# Patient Record
Sex: Female | Born: 1945
Health system: Southern US, Community
[De-identification: ages and names within clinical notes are randomized; demographics above are authoritative.]

## PROBLEM LIST (undated history)

## (undated) DIAGNOSIS — Z800 Family history of malignant neoplasm of digestive organs: Secondary | ICD-10-CM

## (undated) DIAGNOSIS — Z933 Colostomy status: Secondary | ICD-10-CM

## (undated) DIAGNOSIS — I480 Paroxysmal atrial fibrillation: Secondary | ICD-10-CM

## (undated) DIAGNOSIS — Z860100 Personal history of colon polyps, unspecified: Secondary | ICD-10-CM

## (undated) DIAGNOSIS — M810 Age-related osteoporosis without current pathological fracture: Secondary | ICD-10-CM

## (undated) DIAGNOSIS — M8580 Other specified disorders of bone density and structure, unspecified site: Secondary | ICD-10-CM

## (undated) DIAGNOSIS — K5780 Diverticulitis of intestine, part unspecified, with perforation and abscess without bleeding: Secondary | ICD-10-CM

## (undated) DIAGNOSIS — R569 Unspecified convulsions: Secondary | ICD-10-CM

## (undated) DIAGNOSIS — Z8719 Personal history of other diseases of the digestive system: Secondary | ICD-10-CM

## (undated) DIAGNOSIS — I219 Acute myocardial infarction, unspecified: Secondary | ICD-10-CM

## (undated) DIAGNOSIS — G43909 Migraine, unspecified, not intractable, without status migrainosus: Secondary | ICD-10-CM

## (undated) DIAGNOSIS — J449 Chronic obstructive pulmonary disease, unspecified: Secondary | ICD-10-CM

## (undated) DIAGNOSIS — I272 Pulmonary hypertension, unspecified: Secondary | ICD-10-CM

## (undated) DIAGNOSIS — K219 Gastro-esophageal reflux disease without esophagitis: Secondary | ICD-10-CM

## (undated) DIAGNOSIS — F419 Anxiety disorder, unspecified: Secondary | ICD-10-CM

## (undated) DIAGNOSIS — L309 Dermatitis, unspecified: Secondary | ICD-10-CM

## (undated) DIAGNOSIS — I10 Essential (primary) hypertension: Secondary | ICD-10-CM

## (undated) DIAGNOSIS — M5030 Other cervical disc degeneration, unspecified cervical region: Secondary | ICD-10-CM

## (undated) DIAGNOSIS — E7800 Pure hypercholesterolemia, unspecified: Secondary | ICD-10-CM

## (undated) DIAGNOSIS — M3500 Sicca syndrome, unspecified: Secondary | ICD-10-CM

## (undated) DIAGNOSIS — S129XXA Fracture of neck, unspecified, initial encounter: Secondary | ICD-10-CM

## (undated) DIAGNOSIS — I2510 Atherosclerotic heart disease of native coronary artery without angina pectoris: Secondary | ICD-10-CM

## (undated) DIAGNOSIS — C44519 Basal cell carcinoma of skin of other part of trunk: Secondary | ICD-10-CM

## (undated) DIAGNOSIS — I7300 Raynaud's syndrome without gangrene: Secondary | ICD-10-CM

## (undated) DIAGNOSIS — C3490 Malignant neoplasm of unspecified part of unspecified bronchus or lung: Secondary | ICD-10-CM

## (undated) DIAGNOSIS — I739 Peripheral vascular disease, unspecified: Secondary | ICD-10-CM

## (undated) DIAGNOSIS — I4892 Unspecified atrial flutter: Secondary | ICD-10-CM

## (undated) HISTORY — PX: SKIN CANCER EXCISION: SHX779

---
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
## 1973-11-13 HISTORY — PX: NASAL SEPTUM SURGERY: SHX37

## 1980-11-13 DIAGNOSIS — H01003 Unspecified blepharitis right eye, unspecified eyelid: Secondary | ICD-10-CM | POA: Insufficient documentation

## 1981-11-13 HISTORY — PX: CERVICAL CONIZATION W/BX: SHX1330

## 1983-11-14 DIAGNOSIS — I7300 Raynaud's syndrome without gangrene: Secondary | ICD-10-CM | POA: Insufficient documentation

## 2007-09-14 DIAGNOSIS — I219 Acute myocardial infarction, unspecified: Secondary | ICD-10-CM

## 2007-09-14 HISTORY — DX: Acute myocardial infarction, unspecified: I21.9

## 2007-09-14 HISTORY — PX: CARDIAC CATHETERIZATION: SHX172

## 2007-09-14 HISTORY — PX: CORONARY ARTERY BYPASS GRAFT: SHX141

## 2007-09-15 ENCOUNTER — Inpatient Hospital Stay: Payer: Self-pay | Admitting: Internal Medicine

## 2007-09-16 ENCOUNTER — Inpatient Hospital Stay (HOSPITAL_COMMUNITY): Admission: EM | Admit: 2007-09-16 | Discharge: 2007-09-23 | Payer: Self-pay | Admitting: Cardiovascular Disease

## 2007-09-17 ENCOUNTER — Ambulatory Visit: Payer: Self-pay | Admitting: Surgery

## 2007-09-26 ENCOUNTER — Encounter: Admission: RE | Admit: 2007-09-26 | Discharge: 2007-09-26 | Payer: Self-pay | Admitting: Cardiothoracic Surgery

## 2007-09-26 ENCOUNTER — Ambulatory Visit: Payer: Self-pay | Admitting: Cardiothoracic Surgery

## 2007-10-06 ENCOUNTER — Emergency Department (HOSPITAL_COMMUNITY): Admission: EM | Admit: 2007-10-06 | Discharge: 2007-10-06 | Payer: Self-pay | Admitting: Emergency Medicine

## 2007-10-08 ENCOUNTER — Encounter: Admission: RE | Admit: 2007-10-08 | Discharge: 2007-10-08 | Payer: Self-pay | Admitting: Surgery

## 2007-10-08 ENCOUNTER — Ambulatory Visit: Payer: Self-pay | Admitting: Surgery

## 2007-10-18 ENCOUNTER — Encounter: Admission: RE | Admit: 2007-10-18 | Discharge: 2007-10-18 | Payer: Self-pay | Admitting: Cardiothoracic Surgery

## 2007-10-18 ENCOUNTER — Ambulatory Visit: Payer: Self-pay | Admitting: Cardiothoracic Surgery

## 2007-11-19 ENCOUNTER — Ambulatory Visit: Payer: Self-pay | Admitting: Surgery

## 2007-12-19 ENCOUNTER — Encounter (HOSPITAL_COMMUNITY): Admission: RE | Admit: 2007-12-19 | Discharge: 2008-03-18 | Payer: Self-pay | Admitting: Cardiovascular Disease

## 2009-06-19 ENCOUNTER — Encounter: Admission: RE | Admit: 2009-06-19 | Discharge: 2009-06-19 | Payer: Self-pay | Admitting: Specialist

## 2009-07-13 ENCOUNTER — Ambulatory Visit (HOSPITAL_COMMUNITY): Admission: RE | Admit: 2009-07-13 | Discharge: 2009-07-13 | Payer: Self-pay | Admitting: Cardiovascular Disease

## 2009-11-13 DIAGNOSIS — S129XXA Fracture of neck, unspecified, initial encounter: Secondary | ICD-10-CM

## 2009-11-13 DIAGNOSIS — R569 Unspecified convulsions: Secondary | ICD-10-CM

## 2009-11-13 HISTORY — DX: Fracture of neck, unspecified, initial encounter: S12.9XXA

## 2009-11-13 HISTORY — DX: Unspecified convulsions: R56.9

## 2010-02-12 DIAGNOSIS — Z8781 Personal history of (healed) traumatic fracture: Secondary | ICD-10-CM | POA: Insufficient documentation

## 2010-02-21 ENCOUNTER — Emergency Department (HOSPITAL_COMMUNITY)
Admission: EM | Admit: 2010-02-21 | Discharge: 2010-02-21 | Payer: Self-pay | Source: Home / Self Care | Admitting: Emergency Medicine

## 2010-12-05 ENCOUNTER — Encounter: Payer: Self-pay | Admitting: Cardiovascular Disease

## 2011-03-28 NOTE — Discharge Summary (Signed)
NAMEMIRABELLA, [name] NO.:  [id number]   MEDICAL RECORD NO.:  [id number]          PATIENT TYPE:  INP   LOCATION:  2019                         FACILITY:  [facility]   PHYSICIAN:  Evelene Croon, M.D.     DATE OF BIRTH:  1946/08/19   DATE OF ADMISSION:  09/16/2007  DATE OF DISCHARGE:                               DISCHARGE SUMMARY   PRIMARY ADMITTING DIAGNOSIS:  Acute coronary syndrome.   ADDITIONAL/DISCHARGE DIAGNOSES:  1. Acute coronary syndrome.  2. Severe two-vessel coronary artery disease.  3. Hypertension.  4. Long history of tobacco abuse.  5. Chronic obstructive pulmonary disease/emphysema.  6. Acute blood loss anemia.  7. Hyperlipidemia.   PROCEDURE PERFORMED:  1. Emergent cardiac catheterization with temporary pacemaker      insertion.  2. Emergency coronary artery bypass grafting x3 (left internal mammary      artery to the LAD, saphenous vein graft to the second diagonal,      saphenous vein graft to the posterior descending).  3. Endoscopic vein harvest right leg.   HISTORY:  The patient is a 65 year old, white female who presented on  September 15, 2007 to [facility] with complaints of  chest pain. She was found to have anterolateral EKG changes and was  admitted with a diagnosis of acute coronary syndrome.  Throughout the  night, she continued to have intermittent episodes of chest pain as well  as episodes of hypotension and severe bradycardia despite heparin,  nitroglycerin and Integrilin. She was subsequently transferred to [facility] on September 16, 2007 for further cardiac workup.   HOSPITAL COURSE:  Ms. Norgard was transferred to [facility] on September 16, 2007  and was seen by [facility] and vascular.  Because of her  continued episodes of chest pain, she was taken to the cardiac cath lab  for emergency cardiac catheterization.  This was performed by Dr. Tresa Endo  and she was found to have severe two-vessel coronary  artery disease  including a 99% ostial right coronary stenosis, a 95% proximal stenosis  of the LAD with a complex 90% stenosis at the bifurcation of the second  diagonal branch. Left ventricular ejection fraction was preserved at  about 50%.  The patient had no further episodes of chest pain in the  cath lab, but she did have some bradycardia and a temporary pacemaker  was placed. Because of her severe disease, she was not felt to be a good  candidate for percutaneous intervention and therefore a cardiothoracic  surgery consultation was obtained.  Dr. Laneta Simmers saw the patient in the  cath lab and reviewed her films and felt that her best option would be  to proceed with emergency CABG at this time in order to prevent further  ischemia infarction and death.  He explained the risks, benefits and  alternatives of the procedure with the patient and her family and she  agreed to proceed.  She was taken immediately to the operating room and  underwent coronary artery bypass grafting x3 performed by Dr. Laneta Simmers.  She tolerated the procedure well and was transferred  to the SICU in  stable condition.  She was able to be extubated shortly after surgery.  She was hemodynamically stable although she did require Neo-Synephrine  and dopamine drip initially postoperatively.  She remained in the unit  for further observation.  By postop day #2 she was off all drips.  Her  chest tubes and hemodynamic monitoring devices were removed and she was  slowly allowed to ambulate with cardiac rehab's assistance.  Late in the  day on postop day #2, she was able to be transferred to the floor.  Overall she has done well postoperatively. Her main postoperative  difficulty has been from volume overload.  She has been approximately 15-  20 kg above her preoperative weight and has been aggressively diuresed  with both IV and p.o. Lasix.  She has responded well and is diuresing  but continues to have lower extremity edema  and some small bilateral  pleural effusions on physical exam.  On postop day #4, she is 71 kg with  a preop of 59.  She has been started on beta blocker as well as an ACE  inhibitor and a statin.  Her vital signs have been stable and she has  remained afebrile.  She is currently being weaned from supplemental  oxygen and is down to 2 liters and maintaining O2 sats of greater than  90%.  Her incisions are all healing well.  Her labs on postop day #4  show hemoglobin of 10.7, hematocrit 31.2, platelets 196, white count  10.5, sodium 137, potassium 3.7, BUN 8, creatinine 0.59.  She has been  ambulating without problem.  She is tolerating a regular diet and is  having normal bowel and bladder function.  She will continue to be  observed for at least the next 24-48 hours.  If she remains stable, is  able to wean from supplemental oxygen and is otherwise doing well, she  will hopefully be ready for discharge home on or about September 22, 2007.   DISCHARGE MEDICATIONS:  1. Enteric-coated aspirin 325 mg daily.  2. Lopressor 25 mg b.i.d.  3. Lisinopril 2.5 mg daily.  4. Lipitor 40 mg daily.  5. Tylox one to two q.4 h p.r.n. for pain.  6. She will need continued diuresis at home with Lasix and potassium      but a final dose and duration for this will be determined at the      time of discharge based on her volume status at the time.   DISCHARGE INSTRUCTIONS:  She is asked to refrain from driving, heavy  lifting or strenuous activity.  She may continue ambulating daily and  using her incentive spirometer.  She may shower daily and clean her  incisions with soap and water.  She will continue a low-fat, low-sodium  diet.   DISCHARGE FOLLOWUP:  She will see Dr. Donata Clay, Dr. Sharee Pimple partner,  back in the office in 3 weeks with a chest x-ray from [facility].  She will need to make an appointment see Dr. Tresa Endo in 2 weeks  for cardiology reevaluation.  In the interim if she experiences  problems  or has questions she is asked to contact our office.      Coral Ceo, P.A.      Evelene Croon, M.D.  Electronically Signed    GC/MEDQ  D:  09/20/2007  T:  09/21/2007  Job:  [id number]   cc:   Nicki Guadalajara, M.D.

## 2011-03-28 NOTE — Assessment & Plan Note (Signed)
OFFICE VISIT   [name], [name]  DOB:  1946-08-31                                        September 26, 2007  CHART #:  [id number]   The patient returns to the office after having emergency coronary artery  bypass grafting x 3 by Dr. Laneta Simmers on 09/16/07 and she was discharged  home on 09/23/07.  She comes in today after discussing last night with  Dr. Laneta Simmers some choking sensation she was having also constipation,  painful urination and wanted Protonix.  The patient was discharged home  on home oxygen.  Currently she is on 2 liters.  She notes that last  night she had some difficulty swallowing her pills with painful  swallowing.  This has improved today.  She has also had some slight  burning on urination and has not had a bowel movement since last week.  A chest x-ray was done today.   On exam today her blood pressure is 101/57, pulse is 57, respiratory  rate is 18, O2 sat is 98% on 2 liters nasal cannula.  She is afebrile.  Overall she looks very well.  She is in no distress and is having no  respiratory difficulty.  On exam she has no wheezing.  Sternum is stable  and well healed.  She has no strider.  Abdomen is not tender to  palpation.   Chest x-ray was obtained and showed clearing of small bilateral  effusions that were present in the postoperative.  Overall she appears  to be stable and she has complained of reflux symptoms in the past.  Was  not on Protonix preoperatively but note that it did help while she was  in the hospital and gave her prescription for Protonix for 2 weeks 40 mg  once a day.  She will use milk of magnesia and a fleets enema to help  with her bowel situation.  She was discharged home on Cipro which should  treat urinary tract infection caused by a Foley catheter.  However, in  spite of being on antibiotics I have asked her to get a UA and a urine  culture through her medical doctor's  office.  She will return in approximately  7-10 days for a follow-up  visit with Dr. Laneta Simmers unless she has any further difficulty.   Sheliah Plane, MD  Electronically Signed   EG/MEDQ  D:  09/26/2007  T:  09/27/2007  Job:  [id number]   cc:   Evelene Croon, M.D.

## 2011-03-28 NOTE — Assessment & Plan Note (Signed)
OFFICE VISIT   [name], [name]  DOB:  [birth year]                                        October 18, 2007  CHART #:  [id number]   CURRENT PROBLEMS:  1. Status post emergency CABG x3, September 16, 2007, for unstable      angina.  2. Chronic obstructive pulmonary disease with history of smoking, now      reformed.  3. Postoperative postcardiotomy syndrome with symptoms improved      following a course of Indocin.   HISTORY OF PRESENT ILLNESS:  The patient is a 65 year old female with a  history of smoking and hypertension, who presented to [facility]  with unstable angina and EKG changes.  She was transferred to [facility], and a CAT showed a 99% ostial right coronary stenosis with a  proximal left coronary stenosis as well.  She underwent left IMA  grafting to the LAD and vein grafts to the diagonal branch to the LAD,  and a vein graft to the posterior descending.  The patient had a  somewhat prolonged postoperative course and required discharge on  oxygen.  She remained in a sinus rhythm and was discharged home on  Lopressor 25 daily, Protonix 40 mg daily, lisinopril 5 mg daily,  aspirin, and Lipitor 40 mg daily.   She now returns for her 2nd posthospital discharge office visit.  She  states she feels much better after taking 3 days of Indocin for  postpericardiotomy pericarditis.  She is breathing much better and not  using oxygen consistently at home.  She denies any angina but has  generalized soreness, and her overall weakness has improved as has her  exercise tolerance.  The surgical incisions are healing very nicely, and  her appetite is good.   PHYSICAL EXAM:  Blood pressure 127/80, pulse 60, respirations 18,  saturation 97%.  She is alert and pleasant.  Breath sounds are clear.  The sternum is stable and well-healed.  Cardiac rhythm is regular  without S3 or S4 rub.  There is no peripheral edema, and the leg  incisions are healing  well.   A PA and lateral chest x-ray shows resolution of the prior small pleural  effusions with clear lung fields now and a stable cardiac shadow.   IMPRESSION AND PLAN:  The patient seems to be oxygenating well now, as  her saturation is 97% off oxygen for several hours.  Her chest x-ray is  also much clearer.  I told the patient and her daughter that she could  use the oxygen only as needed, but she does not need to sleep with  oxygen on or use oxygen other than for periods of postexercise dyspnea.  The patient is still taking Lasix, and I feel that she probably just  needs another week of Lasix, and then that could be stopped.  Otherwise,  she will continue her current medications.  She knows she can resume  driving and that the outpatient cardiac rehab program at [facility] will  contact her.  We will arrange for her to return in 3 weeks for her final  visit and discuss stopping her home oxygen therapy.  No pain medications  were requested or prescribed at this visit.   Kerin Perna, M.D.  Electronically Signed   PV/MEDQ  D:  10/18/2007  T:  10/19/2007  Job:  [id number]   cc:   [facility] [place]  [place] Dr. Tresa Endo, [facility] and [place].  [place] Dr. Vanna Scotland, [facility], [place]

## 2011-03-28 NOTE — Op Note (Signed)
NAMEJILLIANNE, [name] NO.:  [id number]   MEDICAL RECORD NO.:  [id number]          PATIENT TYPE:  INP   LOCATION:  2307                         FACILITY:  [facility]   PHYSICIAN:  Evelene Croon, M.D.     DATE OF BIRTH:  12-Aug-1946   DATE OF PROCEDURE:  09/16/2007  DATE OF DISCHARGE:                               OPERATIVE REPORT   PREOPERATIVE DIAGNOSIS:  Severe 2-vessel coronary artery disease with  acute coronary syndrome.   POSTOPERATIVE DIAGNOSIS:  Severe 2-vessel coronary artery disease with  acute coronary syndrome.   OPERATIVE PROCEDURE:  Emergency median sternotomy, extracorporeal  circulation, coronary bypass graft surgery x3 using a left internal  mammary artery graft to the left anterior descending coronary artery,  with a saphenous vein graft to the second diagonal branch of the LAD,  and a saphenous vein graft to the posterior descending branch of the  right coronary artery.  Endoscopic vein harvesting from the right leg.   ATTENDING SURGEON:  Dr. Evelene Croon.   ASSISTANT:  Gershon Crane, P.A.-C   ANESTHESIA:  General endotracheal.   CLINICAL HISTORY:  This patient is a 65 year old woman with a strong  family history of heart disease and history of smoking and hypertension  who presented last p.m. to [facility] with an  acute coronary syndrome with chest pain and anterolateral EKG changes.  She had further episodes of chest pain throughout the night requiring  heparin, nitroglycerin, and Integrilin.  She had episodes of hypotension  and severe bradycardia and this morning was transferred to [facility]  for cardiac catheterization.  This showed a 99.9% ostial right coronary  artery stenosis with slow flow in the vessel.  The left coronary system  had no significant left main or left circumflex disease.  The LAD had a  95% proximal stenosis and then a complex 90% stenosis at the bifurcation  of the second diagonal branch.  Left  ventricular function was fairly  well preserved with an EF about 50%.  The patient remained stable in the  catheterization lab but did have some bradycardia and had a temporary  pacemaker placed by cardiology.  After review of the catheterization in  the cath lab, I felt the best option would be to proceed with emergency  coronary bypass graft surgery to prevent further ischemia, infarction,  and death.  I discussed the operative procedure with the patient and her  daughter.  We discussed alternatives, benefits, and risks including but  not limited to bleeding, blood transfusion, infection, stroke,  myocardial infarction, graft failure, heart block requiring permanent  pacemaker, and death.   OPERATIVE PROCEDURE:  The patient was taken to the operating room and  placed on the table in the supine position.  After induction of general  endotracheal anesthesia, a Foley catheter was placed in the bladder  using sterile technique.  Preoperative intravenous antibiotics were  given.  Then lines were placed by anesthesiology.  The chest, abdomen  and both lower extremities were prepped and draped in the usual sterile  manner.  The chest was entered through a median  sternotomy incision and  the pericardium opened in the midline.  Examination of the heart showed  hypokinesis of the right ventricle.  The patient had an elevated CVP of  about 25, consistent with volume overload and right heart dysfunction,  probably related to right coronary ischemia.  She remained fairly  stable.  Then the left internal mammary artery was harvested from the  chest wall as a pedicle graft.  This was a small to medium caliber  vessel with good blood flow through it.  At the same time, a segment of  greater saphenous vein was harvested from the right leg using endoscopic  vein harvest technique.  This vein was of medium size and good quality.   Then the patient was heparinized and when an adequate activated clotting   time was achieved, the distal ascending aorta was cannulated using a 20-  [place] aortic cannula for arterial inflow.  Venous outflow was achieved  using a 2-stage venous cannula through the right atrial appendage.  Antegrade cardioplegia and vent cannula was inserted in the aortic root.   The patient was placed on cardiopulmonary bypass and the distal  coronaries identified.  The LAD was heavily diseased in its proximal  portion.  The mid and distal portions were suitable for grafting.  This  vessel got fairly small distally.  The second diagonal branch was a  medium size graftable vessel in its midportion that was suitable for  grafting.  It was heavily diseased proximally.  The right coronary  artery was diffusely diseased with some plaque.  There was a single  posterior descending branch which was a medium size graftable vessel  with no significant disease in it.   Then the patient was cooled to 28 degrees centigrade.  The aorta was  crossclamped and 600 mL of cold blood antegrade cardioplegia was  administered in the aortic root with quick arrest of the heart.  Topical  hypothermia with iced saline was used.  The temperature probe was placed  in the septum and insulating pad in the pericardium.   The first distal anastomosis was performed to the posterior descending  coronary.  The internal diameter was about 1.6 mm.  The conduit used was  a segment of greater saphenous vein and the anastomosis performed in an  end-to-side manner using continuous 7-0 Prolene suture.  Flow was noted  through the graft and was excellent.  Then a dose of cardioplegia was  given down this vein graft.   The second distal anastomosis was performed to the diagonal branch.  The  second diagonal had an internal diameter of about 1.6 mm.  The conduit  used was a second segment of greater saphenous vein and the anastomosis  formed in an end-to-side manner using continuous 7-0 Prolene suture.  Flow was noted  through the graft and was excellent.  Then another dose  of cardioplegia was given down both vein grafts and the aortic root.   The third distal anastomosis was performed to the midportion of the left  anterior descending coronary artery.  The internal diameter was also  about 1.6 mm.  The conduit used was a left internal mammary graft and  this was brought through an opening in the left pericardium anterior to  the phrenic nerve.  It was anastomosed to the LAD in an end-to-side  manner using continuous 8-0 Prolene suture.  The pedicle was sutured to  the epicardium with 6-0 Prolene sutures.  The patient was rewarmed to 37  degrees centigrade.  Then the 2 proximal vein graft anastomoses were  performed to the aortic root in an end-to-side manner using continuous 6-  0 Prolene suture.  The clamp was then removed from the mammary pedicle.  There was rapid warming of the ventricular septum and return of  spontaneous ventricular fibrillation.  The crossclamp removed time of 66  minutes and the patient spontaneously returned to sinus rhythm.  The  proximal and distal anastomoses appeared hemostatic and lie of the graft  satisfactory.  Graft markers were placed around the proximal  anastomoses.  Two temporary right ventricular and right atrial pacing  wires were placed and brought out through the skin.   When the patient rewarmed to 37 degrees centigrade, she was weaned from  cardiopulmonary bypass on low-dose dopamine.  Total bypass time was 88  minutes.  Cardiac function appeared good with a cardiac output of 5  liters a minute.  Protamine was given and the venous and aortic cannulas  were removed without difficulty.  The patient was given 10 units of  platelets since she had been on Integrilin preoperatively and had some  obvious coagulopathy related to that.  She was given 2 units of packed  red blood cells on bypass for a hemoglobin of 7.  After obtaining  hemostasis 3 chest tubes were  placed, with a tube in the posterior  pericardium, one in the anterior mediastinum and one in the left pleural  space.  The sternum was then closed with #6 stainless steel wires.  The  fascia was closed with continuous #1 Vicryl suture.  Subcutaneous tissue  was closed with continuous 2-0 Vicryl and the skin with a 3-0 Vicryl  subcuticular closure.  Lower extremity vein harvest site was closed in  layers in a similar manner.  The  sponge, needle and instrument counts were correct according to scrub  nurse.  Dry sterile dressings were applied over the incisions and around  the chest tubes which were hooked to Pleur-evac suction.  The patient  remained hemodynamically stable and was transferred to the SICU in  guarded but stable condition.      Evelene Croon, M.D.  Electronically Signed     BB/MEDQ  D:  09/16/2007  T:  09/17/2007  Job:  [id number]   cc:   Nicki Guadalajara, M.D.

## 2011-03-28 NOTE — Assessment & Plan Note (Signed)
OFFICE VISIT   [name], Bridget  DOB:  1946/05/26                                        October 08, 2007  CHART #:  [id number]   The patient returned today for followup. She is status post emergency  coronary artery bypass graft surgery x3 on September 16, 2007. She had  been seen by Dr. Tyrone Sage on September 26, 2007 with some complaints of  dysphagia and reflux type symptoms. She was started on Protonix 40 mg  per day and noted a marked improvement in these symptoms. She has  remained on oxygen at home. She said that she had weaned off of the  oxygen briefly, but then over the last several days has noticed some  pleuritic right chest pain as well as increased shortness of breath and  went back on the oxygen. She had stopped taking her Lasix for several  days due to feeling that it was drying her out and has noticed some  increased swelling in her legs and feet. She had noticed no fever or  chills. She has had no sputum production or hemoptysis. She said the  pleuritic right chest pain has prevented her from taking deep breaths.   PHYSICAL EXAMINATION:  Today, her blood pressure is 98/63, pulse 58 and  regular. Respiratory rate is 18 and nonlabored. Her oxygen saturation is  96% on 2.5 liters per minute. She looks better than she did when she  went home. CARDIAC: Shows a regular rate and rhythm with normal heart  sounds. There is no murmur, rub or gallop. LUNGS:  Reveals decreased  breath sounds in the bases. There are a few crackles bilaterally. Chest  incision is healing well and the sternum is stable. There is mild  bilateral pedal edema. ABDOMEN: Examination is benign.   Followup chest x-ray today shows small bilateral pleural effusions,  blunted costophrenic angles. Her last chest x-ray was done a few days  ago in the emergency room and showed tiny bilateral pleural effusions.  The effusions today seemed slightly larger than a few days ago.   MEDICATIONS:  1. Lopressor ER 25 mg daily.  2. Protonix 40 mg daily.  3. Lisinopril 5 mg daily.  4. Aspirin 325 mg daily.  5. Lipitor 40 mg daily.  6. Lasix 40 mg daily.  7. Potassium chloride 20 mEq daily.  8. Ultram p.r.n. for pain. She said the Ultram has made her very      sleepy and she stopped taking it.   IMPRESSION:  Overall, I think the patient is making a slow, but  progressive recovery following surgery. She does have small bilateral  pleural effusions and some pleuritic right chest pain, probably related  to that and certainly could have a post pericardiotomy syndrome. She did  take a Lasix pill yesterday and said that it made her urinate a lot and  she noticed a decrease in her lower extremity swelling. I asked her to  return to taking her Lasix 40 mg daily and potassium chloride 20 mEq  daily. I also asked her to keep track of her blood pressure, pulse, and  weight. I started her on Indocin 25 mg t.i.d. x3 days to see if this  will help her pain if this is related to a post cardiotomy syndrome. I  also wrote her a prescription for oxycodone  IR 5 mg p.o. q4 hours p.r.n.  for pain #40 since she was taking this at the time of discharge and said  that it worked well without the sleepiness. She will continue to  followup with Dr. Tresa Endo and she has a followup appointment to see Dr.  Donata Clay in our office next Friday with a chest x-ray to assure that  her pleural effusions have resolved. She has continued to abstain from  smoking and I would expect that her oxygen saturations will continue to  improve with time and she will be weaned off of oxygen.   Evelene Croon, M.D.  Electronically Signed   BB/MEDQ  D:  10/08/2007  T:  10/08/2007  Job:  [id number]   cc:   Nicki Guadalajara, M.D.

## 2011-03-28 NOTE — Consult Note (Signed)
[name], [name] NO.:  [id number]   MEDICAL RECORD NO.:  [id number]          PATIENT TYPE:  INP   LOCATION:  2399                         FACILITY:  [facility]   PHYSICIAN:  Evelene Croon, M.D.     DATE OF BIRTH:  November 20, 1945   DATE OF CONSULTATION:  09/16/2007  DATE OF DISCHARGE:                                 CONSULTATION   REASON FOR CONSULTATION:  Severe two-vessel coronary disease with acute  coronary syndrome   CLINICAL HISTORY:  I was asked by Dr. Nicki Guadalajara to evaluate this 65-  year-old woman in the cardiac catheterization lab for consideration of  emergent coronary bypass graft surgery.  She has a history of  hypertension, smoking, and a family history of heart disease and  presented to [facility] last night after  developing acute onset of chest pain around 6 o'clock p.m.  Apparently  she had just eaten dinner and was playing with her grandchildren when  she felt substernal chest pain. She sat down on the couch and, according  to her daughter, had a syncopal episode.  She waxed and waned in our of  consciousness several times and complained of chest pain and vomited  once.  She was transported to [facility] by EMS  with institution of nitroglycerin and was feeling better.  She had  negative enzymes on presentation with a troponin I of less than 0.1 and  a total CPK of 70 with MB of 1.1.  Electrocardiogram showed  anterolateral ST and T-wave changes with sinus rhythm in the 70s.  Over  the course of last evening, she developed recurrent episodes of chest  pain treated with nitroglycerin, heparin, and Integrilin.  She had  episodes of hypotension and bradycardia overnight, making medical  therapy difficult.  According to her daughter, she had a very rough  night.  She continued to have chest pain this morning and was  transferred to [facility] for urgent catheterization.  Her second set of  enzymes had  increased with a troponin of 0.5 and a CPK-MB of 10.4 at  about 1:45 in the morning.   In the catheterization lab today, a temporary pacemaker was placed due  to bradycardia.  Cardiac catheterization showed a 99% ostial right  coronary stenosis with faint flow down the vessel.  There were left-to-  right collaterals filling the distal vessel.  The LAD had a 95% proximal  stenosis and a 90% complex bifurcation stenosis at the takeoff of the  large second diagonal branch.  The left circumflex had insignificant  disease.  The left main had no significant disease.  There was no  gradient across the aortic valve.  Left ventricular function was fairly  well preserved with inferior and posterior hypokinesis.  Ejection  fraction was estimated at 50%.  She remained stable in the cardiac  catheterization lab but did have bradycardia with intracoronary  injection.  She had no chest pain in the catheterization lab.   REVIEW OF SYSTEMS:  GENERAL:  She denies any fever or chills.  She has  had no  recent weight changes.  She has had 1-2 months of fatigue and  tiredness.  EYES:  She has a history of dry eye syndrome.  ENT:  Negative.  ENDOCRINE:  She denies diabetes and hypothyroidism.  CARDIOVASCULAR:  According to her daughter, she has had some chest pain  with radiation to her left arm and neck over the past couple months and  became pale and diaphoretic with these episodes.  She denies any PND or  orthopnea.  She has not had exertional dyspnea.  She denies palpitations  and peripheral edema.  RESPIRATORY:  She denies sputum production.  Her  daughter said that she has chronic cough, probably related to smoking.  GI:  She denies nausea, vomiting.  She has had no melena or bright red  blood per rectum.  GU:  She denies dysuria and hematuria.  MUSCULOSKELETAL:  She denies arthralgias and myalgias.  NEUROLOGICAL:  She denies any focal weakness or numbness.  She denies dizziness,  syncope.  He never had  TIA or stroke.  VASCULAR:  She denies  claudication or phlebitis.  PSYCHIATRIC:  Negative.  HEMATOLOGIC:  Negative.   ALLERGIES:  None.   PAST MEDICAL HISTORY:  Is significant for hypertension.  She has history  of dry eye syndrome.   SOCIAL HISTORY:  She is divorced and lives with her elderly mother whom  she cares for.  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  She has  smoke one pack per day x40 years.  She has rare alcohol use.   FAMILY HISTORY:  Strongly positive for cardiac disease.  Her father died  in his 65s of myocardial infarction and had his first MI at age 67.  Her  mother had coronary disease and had stents placed.  A brother had  coronary disease and had bypass surgery.   PHYSICAL EXAMINATION:  VITAL SIGNS:  Blood pressure is 100/60, pulse 80  and being paced.  Respiratory rate is 18 and unlabored.  GENERAL:  She is a well-developed white female in no distress.  HEENT:  Exam shows her to be normocephalic and atraumatic.  Pupils are  equal and reactive to light and accommodation.  Extraocular muscles are  intact.  Throat is clear.  NECK:  Exam shows normal carotid pulses bilaterally.  No bruits.  There  is no adenopathy or thyromegaly.  CARDIAC:  Exam shows regular rate and rhythm with normal S1-S2.  There  is no murmur, rub, or gallop.  ABDOMEN:  Exam shows active bowel sounds.  Abdomen is soft and  nontender.  No palpable masses or organomegaly.  EXTREMITIES:  Exam shows no peripheral edema.  Pedal pulses are palpable  bilaterally.  SKIN:  Warm and dry.  NEUROLOGIC:  Exam shows her to be alert and oriented x3.  Motor and  sensory exams grossly normal.   Electrolytes were normal with BUN of 13, creatinine of 0.8.  Hemoglobin  is 13.9, white blood cell count 11.8, platelet count 407,000.  She is on  Integrilin.[name]   IMPRESSION:  [name] has severe two-vessel coronary artery disease  with acute coronary syndrome and probable non-ST segment-elevation   myocardial infarction.  She has had recurrent episodes of chest pain,  hypotension, bradycardia overnight, and I agree with Dr. Tresa Endo that the  best treatment for this is coronary bypass graft surgery.  She is not a  candidate for stent placement.  I think the surgery should be performed  emergently given her anatomy and the above  symptoms.  I discussed the  operative procedure with her daughter.  We discussed alternatives,  benefits, and risks including but not limited to bleeding, blood  transfusion, infection, stroke, myocardial infarction, graft failure,  and death.  I also discussed the importance of maximum cardiac risk  factor reduction including complete smoking cessation.  She understands  and would like to proceed with surgery.      Evelene Croon, M.D.  Electronically Signed     BB/MEDQ  D:  09/16/2007  T:  09/16/2007  Job:  [id number]   cc:   Nicki Guadalajara, M.D.

## 2011-03-28 NOTE — Discharge Summary (Signed)
[name], FEEBACK NO.:  [id number]   MEDICAL RECORD NO.:  [id number]          PATIENT TYPE:  INP   LOCATION:  2019                         FACILITY:  [facility]   PHYSICIAN:  Evelene Croon, M.D.     DATE OF BIRTH:  Oct 24, 1946   DATE OF ADMISSION:  09/16/2007  DATE OF DISCHARGE:                               DISCHARGE SUMMARY   ADDENDUM  This is an addendum to the patient's discharge summary which was  dictated September 20, 2007.  The patient was tentatively ready for  discharge home September 22, 2007.  Unfortunately the patient's discharge  was held due to the patient complaining of positive productive green  sputum.  This was noted November 8.  Chest x-ray obtained noted to be  stable.  The patient was afebrile.  Felt the patient developed  bronchitis postoperatively and was started on ciprofloxacin 500 mg p.o.  The patient was also requiring 1-2 liters nasal cannula to keep O2 sats  greater than 90%. She was encouraged to use her incentive spirometer and  continued ambulating.  The patient's pulmonary status improved over the  next several days.  The patient was able to be weaned off oxygen sating  greater than 90% on room air.  During this time, the patient's vital  signs continued to be monitored and remained stable.  She remained in  normal sinus rhythm.  All incisions were clean, dry and intact and  healing well.  Lab work postop day #6 showed a white count of 10.7,  hemoglobin of 12.1, hematocrit 35.1, glucose of 299.  Sodium of 140,  potassium 4.5, chloride of 101, bicarb of 30, BUN of 8, creatinine 0.65,  glucose 101.   The patient is tentatively ready for discharge home in the a.m. September 23, 2007.  Please see dictated discharge summary for follow-up  appointments and discharge information.   DISCHARGE MEDICATIONS:  1. Aspirin 325 mg daily.  2. Lopressor 25 mg b.i.d.  3. Lisinopril 2.5 mg daily.  4. Lipitor 40 mg daily.  5. Lasix 40 mg daily.  6.  Potassium chloride 20 mEq daily.  7. Ciprofloxacin 500 mg b.i.d. x7 days.  8. Tylox 1-2 tablets q. 4-6 hours p.r.n. pain.      [name], [place]      Evelene Croon, M.D.  Electronically Signed   KMD/MEDQ  D:  09/22/2007  T:  09/23/2007  Job:  [id number]   cc:   Nicki Guadalajara, M.D.

## 2011-03-28 NOTE — Cardiovascular Report (Signed)
NAMECRISTINA, [name] NO.:  [id number]   MEDICAL RECORD NO.:  [id number]          PATIENT TYPE:  INP   LOCATION:  2908                         FACILITY:  [facility]   PHYSICIAN:  Nicki Guadalajara, M.D.     DATE OF BIRTH:  [birth year]   DATE OF PROCEDURE:  09/16/2007  DATE OF DISCHARGE:                            CARDIAC CATHETERIZATION   PROCEDURES:  1. Emergent cardiac catheterization.  2. Temporary pacemaker insertion.   INDICATIONS:  [name] is a 65 year old female without any  known history of prior cardiac disease.  She does have a history of  hypertension.  There also is family history of cardiac disease with her  father dying of MI in his 10s and having at this first myocardial  infarction in his 60s.  Also she has a mother who has coronary artery  disease.  The patient yesterday developed substernal chest pain and  diaphoresis and apparently had a syncopal spell where she was out  briefly.  She ultimately presented to [facility] emergency room where  chest pain was resolved with nitroglycerin.  Her cardiac markers were  negative.  ECG did not show any acute ST-segment changes but did show  incomplete right bundle branch block and repolarization changes.  She  apparently had another spell in the emergency room where she became  bradycardic which responded to atropine.  She was admitted to the  hospitalist service, and I was contacted.   The patient, in the coronary care unit, did experience chest pain and  had been started on nitroglycerin in addition to heparin.  She also had  two additional episodes where she became significantly bradycardic and  hypotensive, responding to fluids as well as the atropine.  The patient  had Zoll pads in place but never required pacing.  Due to recurrent  symptomatology, arrangements were made for transfer to [facility] in the middle the night.  Prior to transfer, she was also  started on Integrilin.  She was  seen upon arrival and is now referred  for emergent cardiac catheterization and temporary pacemaker insertion  for further definitive evaluation.   PROCEDURE:  After premedication with Valium 4 mg intravenously, the  patient was prepped and draped in the usual fashion.  Her right femoral  artery and right femoral vein were intubated, and a 5-French transvenous  pacemaker was advanced via the right femoral venous sheath to the RV  apex.  This was placed prophylactically since the patient was already  bradycardic in anticipation of significant additional brady arrhythmia  with the coronary injections in this patient whose pulse had already  dropped into the 30s at [facility].  Catheterization was done  using 5-French diagnostic catheters via the  5-French femoral artery  sheath.  A 5-French JL-4 catheter was used for selective angiography in  the left coronary system.  Using the 5-French JR-4 catheter, the ostium  of the RCA was never able to be engaged and was extensively calcified.  Ultimately, using a 5-French no-torque right catheter, the ostium was  able to be selectively cannulated which revealed the 99.9%  ostial  stenosis with TIMI-1 flow antegrade.  Intracoronary nitroglycerin 200  mcg was also administered down the RCA selectively.  This catheter was  also used for selective angiography into the left subclavian system, and  since there was suggestion of a large right vertebral artery, this  catheter was also used for selective angiography into the right  innominate system.  A pigtail catheter was used for biplane cine  ventriculography.  Distal aortography was performed.  With the  demonstration of the subtotal ostial calcified RCA stenosis as well as  high-grade complex, possibly ulcerated stenosis in the LAD, it was felt  that the patient should undergo CABG revascularization surgery for  optimal treatment.  Dr. Laneta Simmers was notified. Angiograms were reviewed  with him in  detail.  The temporary pacemaker was sutured in place as was  the arterial sheath.  The patient's Integrilin was held with plans for  urgent CABG revascularization surgery to be done today.   HEMODYNAMIC DATA:  Central aortic pressure is 122/58.  Left ventricular  pressure is 122/30.   ANGIOGRAPHIC DATA:  Left main coronary artery was mildly calcified and  had mild 10% narrowing in its mid segment.  The left main bifurcated  into an LAD and left circumflex system.   The LAD was moderately calcified.  There was complex 95% stenoses prior  to and just after the first septal perforating artery encompassing the  region of the diagonal takeoff.  The first diagonal vessel had 95%  stenosis proximally and was a large vessel extending towards the apex.  The remainder of the LAD had luminal irregularity of 40-50% in the mid  segment.  There was extensive septal collateralization to the distal RCA  predominantly via the LAD system which was in jeopardy due to the high-  grade LAD lesions.   The circumflex vessel had minimal narrowing of approximately 10-40%  proximally.  The circumflex gave rise two major marginal vessels.   The right coronary artery had extensive calcification at the ostium.  There was 99.9% stenosis of the ostium with a trickle of antegrade flow  seen.  Following intracoronary nitroglycerin 200 mcg, flow was slightly  improved but TIMI flow remained at least TIMI-1/2 to TIMI-1 antegrade   The left subclavian was patent and gave rise to a patent  LIMA  unbypassed vessel.  There was mild irregularity of the subclavian of 20%  beyond the LIMA insertion.  The left vertebral was dilated, and there  appeared to be possible 40% lesion proximally.  The right innominate was  large, bifurcated to the LAD and subclavian without significant disease.  The right vertebral was small.   Biplane cine ventriculography revealed preserved global contractility  with an ejection fraction of  approximately 50%.  There was mild mid to  basal hypocontractility on the RAO projection and apical to low  posterolateral hypocontractility on the LAO projection.   Distal aortography did not demonstrate any significant renal artery  stenosis or aortoiliac disease.   IMPRESSION:  1. Low-normal global left ventricular contractility with mid to basal      inferior hypocontractility and apical to low posterolateral wall      hypocontractility.  2. Significant coronary artery disease with complex 95 and 90% left      anterior descending  stenoses proximally in the region of the      proximal septal perforating artery extending to the diagonal vessel      with 95% stenosis in a large diagonal vessel and 40-50%  lumen      irregularity of the mid left anterior descending;  minimal 10-20%      narrowing in the proximal circumflex; and calcified ostium of the      right coronary artery with subtotal occlusion of at least 99% with      faint TIMI-1/2 flow antegrade, partially improved with sublingual      nitroglycerin as well as extensive left-to-right collateralization      predominately arising from septal perforating arteries jeopardized      via the left anterior descending lesion.   RECOMMENDATIONS:  Dr. Laneta Simmers was paged and came into the catheterization  laboratory after completion of the study.  We reviewed the angiographic  findings in detail.  With the patients complex anatomy, recommend CABG  revascularization surgery today.  Her Integrilin was held in light of  planned CABG surgery.  The temporary pacemaker was sutured in place.           ______________________________  Nicki Guadalajara, M.D.     TK/MEDQ  D:  09/16/2007  T:  09/16/2007  Job:  [id number]   cc:   Reuel Derby, MD  Patients Primary Care Doctor, [facility]

## 2011-03-28 NOTE — Assessment & Plan Note (Signed)
OFFICE VISIT   [name], [name]  DOB:  [birth year]                                        November 19, 2007  CHART #:  [id number]   HISTORY:  [name] returned today for followup, status post emergency  coronary artery bypass graft surgery on September 16, 2007.  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  She was treated with a diuretic and anti-inflammatories and  rapidly improved.  She has subsequently weaned off of her oxygen that  she went home on.  She is ambulating without any chest pain or shortness  of breath.  She has continued to abstain from smoking.   PHYSICAL EXAMINATION:  VITAL SIGNS:  Blood pressure 128/74, pulse 63 and  regular, respirations 18 and unlabored, oxygen saturation on room air  94%.  GENERAL:  She looks well.  HEART:  A regular rate and rhythm with normal heart sounds.  LUNGS:  Exam is clear.  CHEST:  The incision is well-healed.  The sternum is stable.  EXTREMITIES:  Her leg incision is healed and there is no lower extremity  edema.   A chest x-ray was not performed today.   IMPRESSION/PLAN:  [name] appears to be recovering well following her  surgery.  Her initial inflammatory syndrome has completely resolved and  she said that she feels fine and is ready to start cardiac rehab.  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  She will continue to follow up with Dr. Nicki Guadalajara and will  contact me if she develops any problems with her incision.   Evelene Croon, M.D.  Electronically Signed   BB/MEDQ  D:  11/19/2007  T:  11/19/2007  Job:  [id number]   cc:   Nicki Guadalajara, M.D.

## 2011-08-22 LAB — CROSSMATCH
ABO/RH(D): O POS
Antibody Screen: NEGATIVE

## 2011-08-22 LAB — COMPREHENSIVE METABOLIC PANEL
ALT: 39 — ABNORMAL HIGH
ALT: 40 — ABNORMAL HIGH
AST: 59 — ABNORMAL HIGH
AST: 64 — ABNORMAL HIGH
Albumin: 2.9 — ABNORMAL LOW
Albumin: 3.1 — ABNORMAL LOW
Alkaline Phosphatase: 59
Alkaline Phosphatase: 61
BUN: 11
BUN: 13
CO2: 20
CO2: 22
Calcium: 7.5 — ABNORMAL LOW
Calcium: 7.7 — ABNORMAL LOW
Chloride: 109
Chloride: 110
Creatinine, Ser: 0.52
Creatinine, Ser: 0.67
GFR calc Af Amer: >60
GFR calc Af Amer: >60
GFR calc non Af Amer: >60
GFR calc non Af Amer: >60
Glucose, Bld: 128 — ABNORMAL HIGH
Glucose, Bld: 150 — ABNORMAL HIGH
Potassium: 3.7
Potassium: 3.8
Sodium: 136
Sodium: 140
Total Bilirubin: 0.7
Total Bilirubin: 0.7
Total Protein: 5 — ABNORMAL LOW
Total Protein: 5.3 — ABNORMAL LOW

## 2011-08-22 LAB — CBC
HCT: 34.8 — ABNORMAL LOW
HCT: 31.2 — ABNORMAL LOW
HCT: 31.8 — ABNORMAL LOW
HCT: 32.2 — ABNORMAL LOW
HCT: 35.1 — ABNORMAL LOW
HCT: 35.5 — ABNORMAL LOW
HCT: 35.8 — ABNORMAL LOW
HCT: 36
HCT: 36.6
HCT: 37.5
HCT: 38.5
Hemoglobin: 11.9 — ABNORMAL LOW
Hemoglobin: 10.7 — ABNORMAL LOW
Hemoglobin: 10.8 — ABNORMAL LOW
Hemoglobin: 10.8 — ABNORMAL LOW
Hemoglobin: 12
Hemoglobin: 12.1
Hemoglobin: 12.2
Hemoglobin: 12.4
Hemoglobin: 12.5
Hemoglobin: 12.7
Hemoglobin: 13.1
MCHC: 34.2
MCHC: 33.7
MCHC: 33.9
MCHC: 33.9
MCHC: 33.9
MCHC: 34
MCHC: 34.1
MCHC: 34.2
MCHC: 34.3
MCHC: 34.5
MCHC: 34.6
MCV: 87.7
MCV: 87.4
MCV: 88.2
MCV: 88.5
MCV: 88.6
MCV: 88.6
MCV: 88.8
MCV: 88.8
MCV: 88.8
MCV: 89.5
MCV: 89.8
Platelets: 543 — ABNORMAL HIGH
Platelets: 175
Platelets: 195
Platelets: 196
Platelets: 199
Platelets: 223
Platelets: 233
Platelets: 248
Platelets: 299
Platelets: 302
Platelets: 314
RBC: 3.97
RBC: 3.53 — ABNORMAL LOW
RBC: 3.59 — ABNORMAL LOW
RBC: 3.6 — ABNORMAL LOW
RBC: 3.95
RBC: 3.96
RBC: 4.07
RBC: 4.1
RBC: 4.13
RBC: 4.22
RBC: 4.33
RDW: 13.6
RDW: 13.8
RDW: 13.9
RDW: 13.9
RDW: 14
RDW: 14.1 — ABNORMAL HIGH
RDW: 14.1 — ABNORMAL HIGH
RDW: 14.1 — ABNORMAL HIGH
RDW: 14.2 — ABNORMAL HIGH
RDW: 14.3 — ABNORMAL HIGH
RDW: 14.4 — ABNORMAL HIGH
WBC: 10.7 — ABNORMAL HIGH
WBC: 10.2
WBC: 10.5
WBC: 10.7 — ABNORMAL HIGH
WBC: 11.3 — ABNORMAL HIGH
WBC: 11.6 — ABNORMAL HIGH
WBC: 12.6 — ABNORMAL HIGH
WBC: 13.6 — ABNORMAL HIGH
WBC: 14.3 — ABNORMAL HIGH
WBC: 18.8 — ABNORMAL HIGH
WBC: 20 — ABNORMAL HIGH

## 2011-08-22 LAB — I-STAT EC8
Acid-base deficit: 6 — ABNORMAL HIGH
BUN: 6
BUN: 8
Bicarbonate: 20.1
Bicarbonate: 25.9 — ABNORMAL HIGH
Chloride: 107
Chloride: 114 — ABNORMAL HIGH
Glucose, Bld: 117 — ABNORMAL HIGH
Glucose, Bld: 133 — ABNORMAL HIGH
HCT: 31 — ABNORMAL LOW
HCT: 37
Hemoglobin: 10.5 — ABNORMAL LOW
Hemoglobin: 12.6
Operator id: 251431
Operator id: 277261
Potassium: 3.2 — ABNORMAL LOW
Potassium: 4
Sodium: 145
Sodium: 147 — ABNORMAL HIGH
TCO2: 21
TCO2: 27
pCO2 arterial: 43
pCO2 arterial: 44.7
pH, Arterial: 7.278 — ABNORMAL LOW
pH, Arterial: 7.372

## 2011-08-22 LAB — POCT I-STAT 3, ART BLOOD GAS (G3+)
Acid-Base Excess: 1
Acid-Base Excess: 3 — ABNORMAL HIGH
Acid-Base Excess: 4 — ABNORMAL HIGH
Acid-base deficit: 3 — ABNORMAL HIGH
Acid-base deficit: 4 — ABNORMAL HIGH
Acid-base deficit: 6 — ABNORMAL HIGH
Acid-base deficit: 6 — ABNORMAL HIGH
Acid-base deficit: 8 — ABNORMAL HIGH
Bicarbonate: 17.5 — ABNORMAL LOW
Bicarbonate: 20
Bicarbonate: 20.6
Bicarbonate: 22.8
Bicarbonate: 22.9
Bicarbonate: 25.5 — ABNORMAL HIGH
Bicarbonate: 27.4 — ABNORMAL HIGH
Bicarbonate: 28.5 — ABNORMAL HIGH
O2 Saturation: 100
O2 Saturation: 100
O2 Saturation: 84
O2 Saturation: 89
O2 Saturation: 92
O2 Saturation: 93
O2 Saturation: 97
O2 Saturation: 99
Operator id: 196291
Operator id: 196291
Operator id: 251431
Operator id: 261841
Operator id: 287011
Operator id: 3291
Operator id: 3406
Operator id: 3406
Patient temperature: 35.4
Patient temperature: 37.1
Patient temperature: 37.3
Patient temperature: 37.8
Patient temperature: 37.9
TCO2: 19
TCO2: 21
TCO2: 22
TCO2: 24
TCO2: 24
TCO2: 27
TCO2: 29
TCO2: 30
pCO2 arterial: 36.2
pCO2 arterial: 41.6
pCO2 arterial: 42.6
pCO2 arterial: 42.9
pCO2 arterial: 43.1
pCO2 arterial: 43.6
pCO2 arterial: 43.9
pCO2 arterial: 44.1
pH, Arterial: 7.279 — ABNORMAL LOW
pH, Arterial: 7.289 — ABNORMAL LOW
pH, Arterial: 7.294 — ABNORMAL LOW
pH, Arterial: 7.325 — ABNORMAL LOW
pH, Arterial: 7.327 — ABNORMAL LOW
pH, Arterial: 7.388
pH, Arterial: 7.417 — ABNORMAL HIGH
pH, Arterial: 7.421 — ABNORMAL HIGH
pO2, Arterial: 127 — ABNORMAL HIGH
pO2, Arterial: 246 — ABNORMAL HIGH
pO2, Arterial: 308 — ABNORMAL HIGH
pO2, Arterial: 48 — ABNORMAL LOW
pO2, Arterial: 64 — ABNORMAL LOW
pO2, Arterial: 64 — ABNORMAL LOW
pO2, Arterial: 71 — ABNORMAL LOW
pO2, Arterial: 96

## 2011-08-22 LAB — APTT
aPTT: 103 — ABNORMAL HIGH
aPTT: 37
aPTT: 62 — ABNORMAL HIGH

## 2011-08-22 LAB — DIFFERENTIAL
Basophils Absolute: 0.1
Basophils Absolute: 0
Basophils Absolute: 0.1
Basophils Relative: 1
Basophils Relative: 0
Basophils Relative: 1
Eosinophils Absolute: 0.2
Eosinophils Absolute: 0
Eosinophils Absolute: 0.1
Eosinophils Relative: 2
Eosinophils Relative: 0
Eosinophils Relative: 1
Lymphocytes Relative: 32
Lymphocytes Relative: 11 — ABNORMAL LOW
Lymphocytes Relative: 22
Lymphs Abs: 3.4
Lymphs Abs: 1.5
Lymphs Abs: 2.2
Monocytes Absolute: 0.8
Monocytes Absolute: 0.4
Monocytes Absolute: 0.7
Monocytes Relative: 8
Monocytes Relative: 3
Monocytes Relative: 7
Neutro Abs: 6.1
Neutro Abs: 11.7 — ABNORMAL HIGH
Neutro Abs: 7.1
Neutrophils Relative %: 57
Neutrophils Relative %: 70
Neutrophils Relative %: 86 — ABNORMAL HIGH

## 2011-08-22 LAB — POCT I-STAT 4, (NA,K, GLUC, HGB,HCT)
Glucose, Bld: 103 — ABNORMAL HIGH
Glucose, Bld: 113 — ABNORMAL HIGH
Glucose, Bld: 118 — ABNORMAL HIGH
Glucose, Bld: 122 — ABNORMAL HIGH
Glucose, Bld: 92
Glucose, Bld: 98
HCT: 17 — ABNORMAL LOW
HCT: 20 — ABNORMAL LOW
HCT: 24 — ABNORMAL LOW
HCT: 28 — ABNORMAL LOW
HCT: 31 — ABNORMAL LOW
HCT: 35 — ABNORMAL LOW
Hemoglobin: 10.5 — ABNORMAL LOW
Hemoglobin: 11.9 — ABNORMAL LOW
Hemoglobin: 5.8 — CL
Hemoglobin: 6.8 — CL
Hemoglobin: 8.2 — ABNORMAL LOW
Hemoglobin: 9.5 — ABNORMAL LOW
Operator id: 261841
Operator id: 3291
Operator id: 3406
Operator id: 3406
Operator id: 3406
Operator id: 3406
Potassium: 3.3 — ABNORMAL LOW
Potassium: 3.4 — ABNORMAL LOW
Potassium: 3.7
Potassium: 3.7
Potassium: 4.1
Potassium: 4.6
Sodium: 139
Sodium: 139
Sodium: 140
Sodium: 140
Sodium: 141
Sodium: 142

## 2011-08-22 LAB — BASIC METABOLIC PANEL
BUN: 11
BUN: 6
BUN: 6
BUN: 8
BUN: 8
CO2: 27
CO2: 28
CO2: 29
CO2: 30
CO2: 32
Calcium: 7.1 — ABNORMAL LOW
Calcium: 7.6 — ABNORMAL LOW
Calcium: 7.8 — ABNORMAL LOW
Calcium: 8.8
Calcium: 8.8
Chloride: 101
Chloride: 103
Chloride: 112
Chloride: 115 — ABNORMAL HIGH
Chloride: 90 — ABNORMAL LOW
Creatinine, Ser: 0.54
Creatinine, Ser: 0.57
Creatinine, Ser: 0.59
Creatinine, Ser: 0.65
Creatinine, Ser: 0.77
GFR calc Af Amer: >60
GFR calc Af Amer: >60
GFR calc Af Amer: >60
GFR calc Af Amer: >60
GFR calc Af Amer: >60
GFR calc non Af Amer: >60
GFR calc non Af Amer: >60
GFR calc non Af Amer: >60
GFR calc non Af Amer: >60
GFR calc non Af Amer: >60
Glucose, Bld: 101 — ABNORMAL HIGH
Glucose, Bld: 110 — ABNORMAL HIGH
Glucose, Bld: 137 — ABNORMAL HIGH
Glucose, Bld: 141 — ABNORMAL HIGH
Glucose, Bld: 88
Potassium: 3 — ABNORMAL LOW
Potassium: 3.7
Potassium: 3.7
Potassium: 4.1
Potassium: 4.5
Sodium: 132 — ABNORMAL LOW
Sodium: 137
Sodium: 140
Sodium: 144
Sodium: 148 — ABNORMAL HIGH

## 2011-08-22 LAB — PROTIME-INR
INR: 1
INR: 1.1
INR: 1.1
INR: 1.5
Prothrombin Time: 12.9
Prothrombin Time: 14.5
Prothrombin Time: 14.5
Prothrombin Time: 18.5 — ABNORMAL HIGH

## 2011-08-22 LAB — PREPARE PLATELET PHERESIS

## 2011-08-22 LAB — POCT I-STAT GLUCOSE
Glucose, Bld: 125 — ABNORMAL HIGH
Operator id: 3406

## 2011-08-22 LAB — I-STAT 8, (EC8 V) (CONVERTED LAB)
Acid-Base Excess: 4 — ABNORMAL HIGH
BUN: 15
Bicarbonate: 26.9 — ABNORMAL HIGH
Chloride: 105
Glucose, Bld: 104 — ABNORMAL HIGH
HCT: 37
Hemoglobin: 12.6
Operator id: 133351
Potassium: 4.1
Sodium: 137
TCO2: 28
pCO2, Ven: 33.4 — ABNORMAL LOW
pH, Ven: 7.514 — ABNORMAL HIGH

## 2011-08-22 LAB — CARDIAC PANEL(CRET KIN+CKTOT+MB+TROPI)
CK, MB: 66.6 — ABNORMAL HIGH
Relative Index: 17.7 — ABNORMAL HIGH
Total CK: 377 — ABNORMAL HIGH
Troponin I: 1.87

## 2011-08-22 LAB — BLOOD GAS, ARTERIAL
Acid-base deficit: 5.6 — ABNORMAL HIGH
Bicarbonate: 18.6 — ABNORMAL LOW
FIO2: 0.21
O2 Saturation: 87.5
Patient temperature: 98.6
TCO2: 19.6
pCO2 arterial: 32.4 — ABNORMAL LOW
pH, Arterial: 7.377
pO2, Arterial: 53.3 — ABNORMAL LOW

## 2011-08-22 LAB — CK TOTAL AND CKMB (NOT AT ARMC)
CK, MB: 1.2
CK, MB: 88.6 — ABNORMAL HIGH
CK, MB: 98.5 — ABNORMAL HIGH
Relative Index: INVALID
Relative Index: 16.7 — ABNORMAL HIGH
Relative Index: 9.4 — ABNORMAL HIGH
Total CK: 60
Total CK: 1043 — ABNORMAL HIGH
Total CK: 530 — ABNORMAL HIGH

## 2011-08-22 LAB — TROPONIN I
Troponin I: 0.04
Troponin I: 3.23

## 2011-08-22 LAB — MAGNESIUM
Magnesium: 1.8
Magnesium: 2.4
Magnesium: 2.5
Magnesium: 2.9 — ABNORMAL HIGH

## 2011-08-22 LAB — POCT I-STAT CREATININE
Creatinine, Ser: 0.7
Operator id: 133351

## 2011-08-22 LAB — B-NATRIURETIC PEPTIDE (CONVERTED LAB): Pro B Natriuretic peptide (BNP): 60

## 2011-08-22 LAB — CREATININE, SERUM
Creatinine, Ser: 0.53
Creatinine, Ser: 0.54
GFR calc Af Amer: >60
GFR calc Af Amer: >60
GFR calc non Af Amer: >60
GFR calc non Af Amer: >60

## 2011-08-22 LAB — HEMOGLOBIN AND HEMATOCRIT, BLOOD
HCT: 25.3 — ABNORMAL LOW
Hemoglobin: 8.7 — ABNORMAL LOW

## 2011-08-22 LAB — ABO/RH: ABO/RH(D): O POS

## 2011-08-22 LAB — LIPID PANEL
Cholesterol: 193
HDL: 43
LDL Cholesterol: 130 — ABNORMAL HIGH
Total CHOL/HDL Ratio: 4.5
Triglycerides: 101
VLDL: 20

## 2011-08-22 LAB — PLATELET COUNT: Platelets: 171

## 2013-01-17 ENCOUNTER — Other Ambulatory Visit: Payer: Self-pay

## 2013-01-17 LAB — PROTIME-INR
INR: 2.6
Prothrombin Time: 27.3 secs — ABNORMAL HIGH (ref 11.5–14.7)

## 2013-01-27 ENCOUNTER — Ambulatory Visit: Payer: Self-pay | Admitting: Cardiovascular Disease

## 2013-01-27 DIAGNOSIS — I4891 Unspecified atrial fibrillation: Secondary | ICD-10-CM

## 2013-01-27 DIAGNOSIS — I480 Paroxysmal atrial fibrillation: Secondary | ICD-10-CM | POA: Insufficient documentation

## 2013-01-27 DIAGNOSIS — Z7901 Long term (current) use of anticoagulants: Secondary | ICD-10-CM

## 2013-01-27 HISTORY — DX: Paroxysmal atrial fibrillation: I48.0

## 2013-02-21 ENCOUNTER — Observation Stay (HOSPITAL_COMMUNITY)
Admission: EM | Admit: 2013-02-21 | Discharge: 2013-02-22 | Disposition: A | Discharge status: Home / Self Care | Payer: Medicare Other | Attending: Cardiovascular Disease | Admitting: Cardiovascular Disease

## 2013-02-21 ENCOUNTER — Emergency Department (HOSPITAL_COMMUNITY): Payer: Medicare Other

## 2013-02-21 ENCOUNTER — Encounter (HOSPITAL_COMMUNITY): Payer: Self-pay | Admitting: Cardiology

## 2013-02-21 DIAGNOSIS — I25118 Atherosclerotic heart disease of native coronary artery with other forms of angina pectoris: Secondary | ICD-10-CM | POA: Diagnosis present

## 2013-02-21 DIAGNOSIS — R6884 Jaw pain: Secondary | ICD-10-CM | POA: Insufficient documentation

## 2013-02-21 DIAGNOSIS — I2510 Atherosclerotic heart disease of native coronary artery without angina pectoris: Secondary | ICD-10-CM | POA: Insufficient documentation

## 2013-02-21 DIAGNOSIS — R0789 Other chest pain: Principal | ICD-10-CM | POA: Insufficient documentation

## 2013-02-21 DIAGNOSIS — I10 Essential (primary) hypertension: Secondary | ICD-10-CM | POA: Insufficient documentation

## 2013-02-21 DIAGNOSIS — I4891 Unspecified atrial fibrillation: Secondary | ICD-10-CM | POA: Insufficient documentation

## 2013-02-21 DIAGNOSIS — Z7901 Long term (current) use of anticoagulants: Secondary | ICD-10-CM

## 2013-02-21 DIAGNOSIS — Z951 Presence of aortocoronary bypass graft: Secondary | ICD-10-CM | POA: Insufficient documentation

## 2013-02-21 DIAGNOSIS — I700 Atherosclerosis of aorta: Secondary | ICD-10-CM | POA: Insufficient documentation

## 2013-02-21 DIAGNOSIS — I480 Paroxysmal atrial fibrillation: Secondary | ICD-10-CM | POA: Diagnosis present

## 2013-02-21 DIAGNOSIS — R079 Chest pain, unspecified: Secondary | ICD-10-CM | POA: Diagnosis present

## 2013-02-21 HISTORY — DX: Fracture of neck, unspecified, initial encounter: S12.9XXA

## 2013-02-21 HISTORY — DX: Essential (primary) hypertension: I10

## 2013-02-21 HISTORY — DX: Pure hypercholesterolemia, unspecified: E78.00

## 2013-02-21 HISTORY — DX: Acute myocardial infarction, unspecified: I21.9

## 2013-02-21 HISTORY — DX: Migraine, unspecified, not intractable, without status migrainosus: G43.909

## 2013-02-21 HISTORY — DX: Atherosclerotic heart disease of native coronary artery without angina pectoris: I25.10

## 2013-02-21 HISTORY — DX: Anxiety disorder, unspecified: F41.9

## 2013-02-21 HISTORY — DX: Sjogren syndrome, unspecified: M35.00

## 2013-02-21 HISTORY — DX: Unspecified convulsions: R56.9

## 2013-02-21 HISTORY — DX: Basal cell carcinoma of skin of other part of trunk: C44.519

## 2013-02-21 HISTORY — DX: Peripheral vascular disease, unspecified: I73.9

## 2013-02-21 LAB — MAGNESIUM: Magnesium: 2 mg/dL (ref 1.5–2.5)

## 2013-02-21 LAB — APTT: aPTT: 41 seconds — ABNORMAL HIGH (ref 24–37)

## 2013-02-21 LAB — HEPATIC FUNCTION PANEL
ALT: 46 U/L — ABNORMAL HIGH (ref 0–35)
AST: 28 U/L (ref 0–37)
Albumin: 3.4 g/dL — ABNORMAL LOW (ref 3.5–5.2)
Alkaline Phosphatase: 55 U/L (ref 39–117)
Bilirubin, Direct: <0.1 mg/dL (ref 0.0–0.3)
Total Bilirubin: 0.4 mg/dL (ref 0.3–1.2)
Total Protein: 6.2 g/dL (ref 6.0–8.3)

## 2013-02-21 LAB — CBC WITH DIFFERENTIAL/PLATELET
Basophils Absolute: 0 10*3/uL (ref 0.0–0.1)
Basophils Relative: 0 % (ref 0–1)
Eosinophils Absolute: 0.1 10*3/uL (ref 0.0–0.7)
Eosinophils Relative: 1 % (ref 0–5)
HCT: 41.9 % (ref 36.0–46.0)
Hemoglobin: 14.5 g/dL (ref 12.0–15.0)
Lymphocytes Relative: 31 % (ref 12–46)
Lymphs Abs: 2.8 10*3/uL (ref 0.7–4.0)
MCH: 31 pg (ref 26.0–34.0)
MCHC: 34.6 g/dL (ref 30.0–36.0)
MCV: 89.5 fL (ref 78.0–100.0)
Monocytes Absolute: 0.6 10*3/uL (ref 0.1–1.0)
Monocytes Relative: 7 % (ref 3–12)
Neutro Abs: 5.5 10*3/uL (ref 1.7–7.7)
Neutrophils Relative %: 61 % (ref 43–77)
Platelets: 252 10*3/uL (ref 150–400)
RBC: 4.68 MIL/uL (ref 3.87–5.11)
RDW: 13.3 % (ref 11.5–15.5)
WBC: 9 10*3/uL (ref 4.0–10.5)

## 2013-02-21 LAB — BASIC METABOLIC PANEL
BUN: 19 mg/dL (ref 6–23)
CO2: 26 mEq/L (ref 19–32)
Calcium: 9.3 mg/dL (ref 8.4–10.5)
Chloride: 101 mEq/L (ref 96–112)
Creatinine, Ser: 0.63 mg/dL (ref 0.50–1.10)
GFR calc Af Amer: >90 mL/min (ref >90)
GFR calc non Af Amer: >90 mL/min (ref >90)
Glucose, Bld: 108 mg/dL — ABNORMAL HIGH (ref 70–99)
Potassium: 4.3 mEq/L (ref 3.5–5.1)
Sodium: 136 mEq/L (ref 135–145)

## 2013-02-21 LAB — PROTIME-INR
INR: 2.64 — ABNORMAL HIGH (ref 0.00–1.49)
Prothrombin Time: 26.9 seconds — ABNORMAL HIGH (ref 11.6–15.2)

## 2013-02-21 LAB — TROPONIN I: Troponin I: <0.3 ng/mL (ref <0.30)

## 2013-02-21 LAB — POCT I-STAT TROPONIN I: Troponin i, poc: 0 ng/mL (ref 0.00–0.08)

## 2013-02-21 MED ORDER — ACETAMINOPHEN 500 MG PO TABS
500.0000 mg | ORAL_TABLET | Freq: Two times a day (BID) | ORAL | Status: DC | PRN
  Administered 2013-02-21: 500 mg via ORAL
  Filled 2013-02-21: qty 1

## 2013-02-21 MED ORDER — WARFARIN 1.25 MG HALF TABLET
1.2500 mg | ORAL_TABLET | ORAL | Status: DC
  Filled 2013-02-21: qty 1

## 2013-02-21 MED ORDER — CILOSTAZOL 100 MG PO TABS
100.0000 mg | ORAL_TABLET | Freq: Two times a day (BID) | ORAL | Status: DC
  Administered 2013-02-21 – 2013-02-22 (×2): 100 mg via ORAL
  Filled 2013-02-21 (×3): qty 1

## 2013-02-21 MED ORDER — ASPIRIN 81 MG PO CHEW
324.0000 mg | CHEWABLE_TABLET | Freq: Once | ORAL | Status: AC
  Administered 2013-02-21: 324 mg via ORAL
  Filled 2013-02-21: qty 4

## 2013-02-21 MED ORDER — ONDANSETRON HCL 4 MG/2ML IJ SOLN: 4.0000 mg | Freq: Four times a day (QID) | INTRAMUSCULAR | Status: DC | PRN

## 2013-02-21 MED ORDER — AMLODIPINE BESYLATE 5 MG PO TABS
5.0000 mg | ORAL_TABLET | Freq: Every day | ORAL | Status: DC
  Filled 2013-02-21: qty 1

## 2013-02-21 MED ORDER — OMEGA-3-ACID ETHYL ESTERS 1 G PO CAPS
1.0000 g | ORAL_CAPSULE | Freq: Every day | ORAL | Status: DC
  Administered 2013-02-22: 1 g via ORAL
  Filled 2013-02-21: qty 1

## 2013-02-21 MED ORDER — EZETIMIBE 10 MG PO TABS
10.0000 mg | ORAL_TABLET | Freq: Every day | ORAL | Status: DC
  Administered 2013-02-21: 10 mg via ORAL
  Filled 2013-02-21 (×2): qty 1

## 2013-02-21 MED ORDER — LISINOPRIL 10 MG PO TABS
10.0000 mg | ORAL_TABLET | Freq: Two times a day (BID) | ORAL | Status: DC
  Administered 2013-02-21 – 2013-02-22 (×2): 10 mg via ORAL
  Filled 2013-02-21 (×3): qty 1

## 2013-02-21 MED ORDER — SODIUM CHLORIDE 0.9 % IJ SOLN
3.0000 mL | Freq: Two times a day (BID) | INTRAMUSCULAR | Status: DC
  Administered 2013-02-21 – 2013-02-22 (×2): 3 mL via INTRAVENOUS

## 2013-02-21 MED ORDER — SODIUM CHLORIDE 0.9 % IJ SOLN: 3.0000 mL | INTRAMUSCULAR | Status: DC | PRN

## 2013-02-21 MED ORDER — WARFARIN SODIUM 2.5 MG PO TABS: 2.5000 mg | ORAL_TABLET | ORAL | Status: DC

## 2013-02-21 MED ORDER — ACETAMINOPHEN 325 MG PO TABS
650.0000 mg | ORAL_TABLET | ORAL | Status: DC | PRN
  Administered 2013-02-22: 650 mg via ORAL
  Filled 2013-02-21: qty 2

## 2013-02-21 MED ORDER — SIMVASTATIN 20 MG PO TABS
20.0000 mg | ORAL_TABLET | Freq: Every day | ORAL | Status: DC
  Administered 2013-02-21: 20 mg via ORAL
  Filled 2013-02-21 (×2): qty 1

## 2013-02-21 MED ORDER — SODIUM CHLORIDE 0.9 % IV SOLN: 250.0000 mL | INTRAVENOUS | Status: DC | PRN

## 2013-02-21 MED ORDER — WARFARIN 1.25 MG HALF TABLET: 1.2500 mg | ORAL_TABLET | Freq: Every evening | ORAL | Status: DC

## 2013-02-21 MED ORDER — DIGOXIN 125 MCG PO TABS
0.1250 mg | ORAL_TABLET | Freq: Every evening | ORAL | Status: DC
  Administered 2013-02-21: 0.125 mg via ORAL
  Filled 2013-02-21 (×2): qty 1

## 2013-02-21 MED ORDER — ALPRAZOLAM 0.25 MG PO TABS
0.2500 mg | ORAL_TABLET | Freq: Two times a day (BID) | ORAL | Status: DC | PRN
  Administered 2013-02-21: 0.25 mg via ORAL
  Filled 2013-02-21: qty 1

## 2013-02-21 MED ORDER — NITROGLYCERIN 0.4 MG SL SUBL: 0.4000 mg | SUBLINGUAL_TABLET | SUBLINGUAL | Status: DC | PRN

## 2013-02-21 MED ORDER — ZOLPIDEM TARTRATE 5 MG PO TABS: 5.0000 mg | ORAL_TABLET | Freq: Every evening | ORAL | Status: DC | PRN

## 2013-02-21 MED ORDER — ISOSORBIDE MONONITRATE ER 30 MG PO TB24
30.0000 mg | ORAL_TABLET | Freq: Every day | ORAL | Status: DC
  Administered 2013-02-22: 30 mg via ORAL
  Filled 2013-02-21: qty 1

## 2013-02-21 MED ORDER — METOPROLOL TARTRATE 50 MG PO TABS
50.0000 mg | ORAL_TABLET | Freq: Two times a day (BID) | ORAL | Status: DC
  Administered 2013-02-21 – 2013-02-22 (×2): 50 mg via ORAL
  Filled 2013-02-21 (×3): qty 1

## 2013-02-21 MED ORDER — WARFARIN - PHYSICIAN DOSING INPATIENT: Freq: Every day | Status: DC

## 2013-02-21 MED ORDER — ASPIRIN EC 81 MG PO TBEC
81.0000 mg | DELAYED_RELEASE_TABLET | Freq: Every day | ORAL | Status: DC
  Filled 2013-02-21: qty 1

## 2013-02-21 NOTE — ED Notes (Signed)
[facility] Cardiology MD at bedside.

## 2013-02-21 NOTE — ED Notes (Signed)
Pt reports she has had jaw pain that started Wednesday and has increased over the past couple of days. States she has cardiac hx and hx of a-fib. States she is on coumadin and has been concerned about having a blood clot. States the pain is her jaw was severe this afternoon but did become better. Denies any chest pain or SOB.

## 2013-02-21 NOTE — H&P (Signed)
Sherman Lipuma is an 67 y.o. female.   Chief Complaint: chest and jaw pain Cardiologist:  Dr. Tresa Endo HPI: 66yoWDF with hx CAD and CABG 09/2007 with LIMA to LAD, VG to second diag. Branch of LAD and VG to PDA of the RCA, last nuc 2012 without ischemia.  Recently she has had 2 episodes of PAF with RVR requiring medication changes.  Last OV in 01/2013 she was in SR.  She is on coumadin with a CHADS Vasc2 of 4.    Last 2 nights she had episode of Rt. Jaw pain that would occur at night.  Then today more SOB walking uphill on her regular walking trail.  She then developed Rt. Jaw pain that eventually radiated to her chest.  She became dizzy and nauseated.  Her BP was 190/90 at home so she took her prn amlodipine.   She went to [facility] in [place] but they wanted her [place] to ER, she then drove to [facility] ER.  She felt better by that time with improved BP.  At [facility] with ASA her symptoms have resolved.   EKG with nonspecific ST changes.  Troponin POC negative at 0.00. INR 2.64.   Past Medical History  Diagnosis Date  . Atrial fibrillation     PAF  . Hypertension   . Chest pain 02/21/2013  . CAD (coronary artery disease) with CABG     Last Nuc 2012 that was low risk  . Echocardiogram abnormal 09/2012    lt. atrium severly dilated overall good LV function  . PVD (peripheral vascular disease)     ABIs Rt 0.99 and Lt. 0.99  . Paroxysmal atrial fibrillation, maintaining SR on coumadin  01/27/2013    Past Surgical History  Procedure Laterality Date  . Coronary artery bypass graft  09/2007  . Cardiac catheterization      Family History  Problem Relation Age of Onset  . CAD Mother     died at 81    Social History:  reports that she has quit smoking. She does not have any smokeless tobacco history on file. She reports that  drinks alcohol. She reports that she does not use illicit drugs. Divorced, lives alone. 2 children, 2 grandchildren.  Allergies:  Allergies  Allergen Reactions  . Crestor  (Rosuvastatin)   . Diltiazem     Weakness on oral Dilt  . Lipitor (Atorvastatin)   . Penicillins Other (See Comments)    Blurred vision  . Promethazine Other (See Comments)    Uncontrolled leg shaking  . Sulfa Antibiotics Rash    Out patient meds: ASA 81 mg daily Coumadin Held wed and Thursday due to elevated INR, took today Toprol 50 mg in AM and 25 at bedtime unless pulse is elevated or BP Lisinopril 10 mg BID Imdur 30 mg daily Pletal 100 mg daily Zetia 10 mg daily Simvastatin 20 mg daily Lovaza 1 gram daily  Lanoxin 0.125 mg daily Amlodipine 2.5 mg daily B6 vit. Daily    Results for orders placed during the hospital encounter of 02/21/13 (from the past 48 hour(s))  CBC WITH DIFFERENTIAL     Status: None   Collection Time    02/21/13  3:38 PM      Result Value Range   WBC 9.0  4.0 - 10.5 K/uL   RBC 4.68  3.87 - 5.11 MIL/uL   Hemoglobin 14.5  12.0 - 15.0 g/dL   HCT 16.1  09.6 - 04.5 %   MCV 89.5  78.0 - 100.0  fL   MCH 31.0  26.0 - 34.0 pg   MCHC 34.6  30.0 - 36.0 g/dL   RDW 14.7  82.9 - 56.2 %   Platelets 252  150 - 400 K/uL   Neutrophils Relative 61  43 - 77 %   Neutro Abs 5.5  1.7 - 7.7 K/uL   Lymphocytes Relative 31  12 - 46 %   Lymphs Abs 2.8  0.7 - 4.0 K/uL   Monocytes Relative 7  3 - 12 %   Monocytes Absolute 0.6  0.1 - 1.0 K/uL   Eosinophils Relative 1  0 - 5 %   Eosinophils Absolute 0.1  0.0 - 0.7 K/uL   Basophils Relative 0  0 - 1 %   Basophils Absolute 0.0  0.0 - 0.1 K/uL  BASIC METABOLIC PANEL     Status: Abnormal   Collection Time    02/21/13  3:38 PM      Result Value Range   Sodium 136  135 - 145 mEq/L   Potassium 4.3  3.5 - 5.1 mEq/L   Chloride 101  96 - 112 mEq/L   CO2 26  19 - 32 mEq/L   Glucose, Bld 108 (*) 70 - 99 mg/dL   BUN 19  6 - 23 mg/dL   Creatinine, Ser 1.30  0.50 - 1.10 mg/dL   Calcium 9.3  8.4 - 86.5 mg/dL   GFR calc non Af Amer >90  >90 mL/min   GFR calc Af Amer >90  >90 mL/min   Comment:            The eGFR has been  calculated     using the CKD EPI equation.     This calculation has not been     validated in all clinical     situations.     eGFR's persistently     <90 mL/min signify     possible Chronic Kidney Disease.  APTT     Status: Abnormal   Collection Time    02/21/13  3:38 PM      Result Value Range   aPTT 41 (*) 24 - 37 seconds   Comment:            IF BASELINE aPTT IS ELEVATED,     SUGGEST PATIENT RISK ASSESSMENT     BE USED TO DETERMINE APPROPRIATE     ANTICOAGULANT THERAPY.  PROTIME-INR     Status: Abnormal   Collection Time    02/21/13  3:38 PM      Result Value Range   Prothrombin Time 26.9 (*) 11.6 - 15.2 seconds   INR 2.64 (*) 0.00 - 1.49  POCT I-STAT TROPONIN I     Status: None   Collection Time    02/21/13  3:58 PM      Result Value Range   Troponin i, poc 0.00  0.00 - 0.08 ng/mL   Comment 3            Comment: Due to the release kinetics of cTnI,     a negative result within the first hours     of the onset of symptoms does not rule out     myocardial infarction with certainty.     If myocardial infarction is still suspected,     repeat the test at appropriate intervals.   Dg Chest Port 1 View  02/21/2013  *RADIOLOGY REPORT*  Clinical Data: Right jaw pain  PORTABLE CHEST - 1 VIEW  Comparison: 10/18/2007  Findings: The cardiomediastinal silhouette is stable.  Status post CABG.  Atherosclerotic calcifications of thoracic aorta.  No acute infiltrate or pulmonary edema.  IMPRESSION: No active disease.  No significant change.   Original Report Authenticated By: Natasha Mead, M.D.     ROS: General:no colds or fevers, no weight changes Skin:no rashes or ulcers HEENT:no blurred vision, no congestion CV:see HPI PUL:see HPI GI:no diarrhea constipation or melena, no indigestion GU:no hematuria, no dysuria MS:no joint pain, no claudication Neuro:no syncope, no lightheadedness Endo:no diabetes, no thyroid disease Psych: increased anxiety since Mother's death and nightmares  that she the patient is dying.    Blood pressure 137/66, pulse 64, temperature 97.9 F (36.6 C), temperature source Oral, resp. rate 21, SpO2 95.00%. PE: General: Currently pain free Skin: nl turgor HEENT: [place]/AT Neck: no JVD Heart: RRR no s3 Lungs: no rales Abd: soft, BS+ Ext: no edema Neuro: nonfocal    Assessment/Plan Principal Problem:   Chest pain Active Problems:   Paroxysmal atrial fibrillation, maintaining SR on coumadin    Long term (current) use of anticoagulants   CAD (coronary artery disease) with CABG  PLAN: admit to Obs, she has already taken coumadin today.  Serial troponin.  If stable by AM will d/c and do outpatient nuc.   INGOLD,LAURA R 02/21/2013, 7:56 PM   Patient seen and examined. Agree with assessment and plan. Very pleasant  67 yo WF who is s/p CABG for severe multivessal CAD. She had been very stable until recently but had been under significant increased stress with her dying mother and had experienced 2 episodes of AF. She was last seen in office 3 weeks ago and was back in NSR. Shwe admits to being anxious, abnd yesterday and today noted jaw pain and vague different right sided chest sensation. She also has noticed exertional dyspnea. Last nuclear scan was 2 yrs ago and did not show any ischemia. ECG now shows sinus rhythm with mild RV conduction delay and nonspecific T change in V2. Will admit overnight for observation. Serial cardiac markers and repeat ECG in AM. Will titrate Toprol to 50 mg bid, and continue daily amlodipine. Probable dc tomorrow if stable with plan for outpatient Nuclear study. Continue coumadin for now.    Lennette Bihari, MD, [facility] 02/21/2013 7:59 PM

## 2013-02-21 NOTE — ED Notes (Signed)
C/o intermittent episodes of right jaw pain past 2 nights. Today reports right jaw pain radiated into right side chest with dizziness & nausea. Denies SOB. Stated today SBP 190's therefore took a dose of amlodipine. Relief of CP 2 hrs afterwards. Pt also reports DOE past 2 days while taking her daily walk.

## 2013-02-21 NOTE — ED Provider Notes (Signed)
History     CSN: [id number]  Arrival date & time 02/21/13  1526   First MD Initiated Contact with Patient 02/21/13 1545      Chief Complaint  Patient presents with  . Jaw Pain    (Consider location/radiation/quality/duration/timing/severity/associated sxs/prior treatment) The history is provided by the patient.   67 year old female known history of coronary artery disease. Status post CABG 2008. Currently followed by Dr. Tresa Endo from [facility] heart and vascular. Patient with intermittent onset of right jaw pain starting yesterday today it started to radiate into her right chest that started at about 12:15 today. 10 out of 10 at its worse. Currently pain free. Patient also normally walks the track each day the last 2 days when she has done that she has had increased shortness of breath. Did not have any pain while walking the track today started afterward. The jaw pain is not consistent with tooth pain. No nausea no vomiting. Patient has a history of atrophic relation is on Coumadin for that recently had to stop her Coumadin for 2 days because the level was too high she normally takes a daily baby aspirin she did not have that today.  Past Medical History  Diagnosis Date  . Atrial fibrillation   . Hypertension     Past Surgical History  Procedure Laterality Date  . Cardiac surgery      triple bypass 2008    History reviewed. No pertinent family history.  History  Substance Use Topics  . Smoking status: Former Games developer  . Smokeless tobacco: Not on file  . Alcohol Use: Yes    OB History   Grav Para Term Preterm Abortions TAB SAB Ect Mult Living                  Review of Systems  Constitutional: Negative for fever and chills.  HENT: Positive for neck pain. Negative for congestion.   Eyes: Negative for visual disturbance.  Respiratory: Positive for shortness of breath.   Cardiovascular: Positive for chest pain.  Gastrointestinal: Negative for nausea, vomiting and  abdominal pain.  Genitourinary: Negative for dysuria.  Musculoskeletal: Negative for back pain.  Skin: Negative for rash.  Neurological: Negative for syncope and headaches.  Hematological: Bruises/bleeds easily.  Psychiatric/Behavioral: Negative for confusion.    Allergies  Penicillins; Promethazine; and Sulfa antibiotics  Home Medications   Current Outpatient Rx  Name  Route  Sig  Dispense  Refill  . amLODipine (NORVASC) 5 MG tablet   Oral   Take 5 mg by mouth daily as needed (for fast heart rate).         [name] aspirin EC 325 MG tablet   Oral   Take 325 mg by mouth daily.         . cilostazol (PLETAL) 100 MG tablet   Oral   Take 100 mg by mouth 2 (two) times daily.         . digoxin (LANOXIN) 0.25 MG tablet   Oral   Take 0.125-0.25 mg by mouth daily.         . diphenhydrAMINE (BENADRYL) 25 MG tablet   Oral   Take 25-50 mg by mouth every 4 (four) hours as needed for itching.         . ezetimibe (ZETIA) 10 MG tablet   Oral   Take 10 mg by mouth daily.         . isosorbide mononitrate (IMDUR) 30 MG 24 hr tablet   Oral   Take 30  mg by mouth daily.         [name] lisinopril (PRINIVIL,ZESTRIL) 10 MG tablet   Oral   Take 10 mg by mouth 2 (two) times daily.         . metoprolol (LOPRESSOR) 50 MG tablet   Oral   Take 25-50 mg by mouth daily as needed (for fast heart rate).         . metoprolol succinate (TOPROL-XL) 50 MG 24 hr tablet   Oral   Take 25-50 mg by mouth 2 (two) times daily. Take with or immediately following a meal.  50 mg every morning and 25-50 every evening         . omega-3 acid ethyl esters (LOVAZA) 1 G capsule   Oral   Take 2 g by mouth daily.         [name] pyridOXINE (VITAMIN B-6) 100 MG tablet   Oral   Take 100 mg by mouth daily.         . simvastatin (ZOCOR) 20 MG tablet   Oral   Take 20 mg by mouth daily.         [name] warfarin (COUMADIN) 2.5 MG tablet   Oral   Take 1.25-2.5 mg by mouth every evening. 1.25 mg  (1/2 tablet)  on Saturday, 2.5 mg (1 tablet) on all other days           BP 177/82  Pulse 71  Temp(Src) 97.9 F (36.6 C) (Oral)  Resp 16  SpO2 99%  Physical Exam  Nursing note and vitals reviewed. Constitutional: She is oriented to person, place, and time. She appears well-developed and well-nourished. No distress.  HENT:  Head: Normocephalic and atraumatic.  Mouth/Throat: Oropharynx is clear and moist.  Eyes: Conjunctivae and EOM are normal. Pupils are equal, round, and reactive to light.  Neck: Normal range of motion.  Cardiovascular: Normal rate, regular rhythm and normal heart sounds.   No murmur heard. Pulmonary/Chest: Effort normal and breath sounds normal. No respiratory distress.  Abdominal: Soft. Bowel sounds are normal. There is no tenderness.  Musculoskeletal: Normal range of motion. She exhibits no edema.  Neurological: She is alert and oriented to person, place, and time. No cranial nerve deficit. She exhibits normal muscle tone. Coordination normal.    ED Course  Procedures (including critical care time)  Labs Reviewed  APTT - Abnormal; Notable for the following:    aPTT 41 (*)    All other components within normal limits  PROTIME-INR - Abnormal; Notable for the following:    Prothrombin Time 26.9 (*)    INR 2.64 (*)    All other components within normal limits  CBC WITH DIFFERENTIAL  BASIC METABOLIC PANEL  POCT I-STAT TROPONIN I   No results found.   Date: 02/21/2013  Rate: 63  Rhythm: normal sinus rhythm  QRS Axis: normal  Intervals: normal  ST/T Wave abnormalities: normal  Conduction Disutrbances:none  Narrative Interpretation:   Old EKG Reviewed: none available  Results for orders placed during the hospital encounter of 02/21/13  CBC WITH DIFFERENTIAL      Result Value Range   WBC 9.0  4.0 - 10.5 K/uL   RBC 4.68  3.87 - 5.11 MIL/uL   Hemoglobin 14.5  12.0 - 15.0 g/dL   HCT 40.9  81.1 - 91.4 %   MCV 89.5  78.0 - 100.0 fL   MCH 31.0  26.0 - 34.0 pg    MCHC 34.6  30.0 - 36.0 g/dL   RDW  13.3  11.5 - 15.5 %   Platelets 252  150 - 400 K/uL   Neutrophils Relative 61  43 - 77 %   Neutro Abs 5.5  1.7 - 7.7 K/uL   Lymphocytes Relative 31  12 - 46 %   Lymphs Abs 2.8  0.7 - 4.0 K/uL   Monocytes Relative 7  3 - 12 %   Monocytes Absolute 0.6  0.1 - 1.0 K/uL   Eosinophils Relative 1  0 - 5 %   Eosinophils Absolute 0.1  0.0 - 0.7 K/uL   Basophils Relative 0  0 - 1 %   Basophils Absolute 0.0  0.0 - 0.1 K/uL  APTT      Result Value Range   aPTT 41 (*) 24 - 37 seconds  PROTIME-INR      Result Value Range   Prothrombin Time 26.9 (*) 11.6 - 15.2 seconds   INR 2.64 (*) 0.00 - 1.49  POCT I-STAT TROPONIN I      Result Value Range   Troponin i, poc 0.00  0.00 - 0.08 ng/mL   Comment 3              1. Chest pain radiating to jaw       MDM  Patient presents with new atypical chest pain from right jaw down into the chest past few days. Patient has a history of coronary disease CABG in 2008. Has never had any pain similar to this. Most recently had trouble with atrial fibrillation. Today's EKG is normal sinus rhythm. First troponin was negative chest x-ray was negative. She is followed by [facility] heart and vascular Dr. Tresa Endo discussed with him he came in and saw her in consultation has been arranged admission. The admission is for rule out just to be on safe side case this is an anginal a prevalent. Patient had a brief recurrence of chest pain while here in the emergency department otherwise she was pain-free upon arrival she was given a 3 or 25 mg of aspirin and the chest pain resolved and has not reoccurred.        Shelda Jakes, MD 02/21/13 2045

## 2013-02-22 ENCOUNTER — Other Ambulatory Visit: Payer: Self-pay | Admitting: Cardiology

## 2013-02-22 ENCOUNTER — Encounter (HOSPITAL_COMMUNITY): Payer: Self-pay | Admitting: General Practice

## 2013-02-22 LAB — CBC
HCT: 37.7 % (ref 36.0–46.0)
Hemoglobin: 12.9 g/dL (ref 12.0–15.0)
MCH: 30.7 pg (ref 26.0–34.0)
MCHC: 34.2 g/dL (ref 30.0–36.0)
MCV: 89.8 fL (ref 78.0–100.0)
Platelets: 212 10*3/uL (ref 150–400)
RBC: 4.2 MIL/uL (ref 3.87–5.11)
RDW: 13.4 % (ref 11.5–15.5)
WBC: 6.8 10*3/uL (ref 4.0–10.5)

## 2013-02-22 LAB — BASIC METABOLIC PANEL
BUN: 17 mg/dL (ref 6–23)
CO2: 27 mEq/L (ref 19–32)
Calcium: 8.7 mg/dL (ref 8.4–10.5)
Chloride: 105 mEq/L (ref 96–112)
Creatinine, Ser: 0.74 mg/dL (ref 0.50–1.10)
GFR calc Af Amer: >90 mL/min (ref >90)
GFR calc non Af Amer: 85 mL/min — ABNORMAL LOW (ref >90)
Glucose, Bld: 94 mg/dL (ref 70–99)
Potassium: 4.5 mEq/L (ref 3.5–5.1)
Sodium: 140 mEq/L (ref 135–145)

## 2013-02-22 LAB — DIGOXIN LEVEL: Digoxin Level: 0.8 ng/mL (ref 0.8–2.0)

## 2013-02-22 LAB — LIPID PANEL
Cholesterol: 173 mg/dL (ref 0–200)
HDL: 85 mg/dL (ref >39)
LDL Cholesterol: 79 mg/dL (ref 0–99)
Total CHOL/HDL Ratio: 2 RATIO
Triglycerides: 43 mg/dL (ref <150)
VLDL: 9 mg/dL (ref 0–40)

## 2013-02-22 LAB — TSH: TSH: 2.078 u[IU]/mL (ref 0.350–4.500)

## 2013-02-22 LAB — PROTIME-INR
INR: 2.84 — ABNORMAL HIGH (ref 0.00–1.49)
Prothrombin Time: 28.4 seconds — ABNORMAL HIGH (ref 11.6–15.2)

## 2013-02-22 LAB — TROPONIN I
Troponin I: <0.3 ng/mL (ref <0.30)
Troponin I: <0.3 ng/mL (ref <0.30)

## 2013-02-22 LAB — HEMOGLOBIN A1C
Hgb A1c MFr Bld: 5.7 % — ABNORMAL HIGH (ref <5.7)
Mean Plasma Glucose: 117 mg/dL — ABNORMAL HIGH (ref <117)

## 2013-02-22 MED ORDER — METOPROLOL SUCCINATE ER 50 MG PO TB24: 50.0000 mg | ORAL_TABLET | Freq: Two times a day (BID) | ORAL | Status: DC

## 2013-02-22 MED ORDER — AMLODIPINE BESYLATE 2.5 MG PO TABS: 2.5000 mg | ORAL_TABLET | Freq: Every day | ORAL | Status: DC

## 2013-02-22 MED ORDER — NITROGLYCERIN 0.4 MG SL SUBL: 0.4000 mg | SUBLINGUAL_TABLET | SUBLINGUAL | Status: DC | PRN

## 2013-02-22 MED ORDER — PNEUMOCOCCAL VAC POLYVALENT 25 MCG/0.5ML IJ INJ
0.5000 mL | INJECTION | Freq: Once | INTRAMUSCULAR | Status: DC
  Filled 2013-02-22: qty 0.5

## 2013-02-22 MED ORDER — AMLODIPINE BESYLATE 2.5 MG PO TABS
2.5000 mg | ORAL_TABLET | Freq: Every day | ORAL | Status: DC
  Administered 2013-02-22: 2.5 mg via ORAL
  Filled 2013-02-22: qty 1

## 2013-02-22 NOTE — Progress Notes (Signed)
[facility] Progress Note  Subjective:  No further symptoms  Objective:   Vital Signs in the last 24 hours: Temp:  [97.7 F (36.5 C)-97.9 F (36.6 C)] 97.9 F (36.6 C) (04/12 0500) Pulse Rate:  [55-71] 55 (04/12 0500) Resp:  [16-21] 18 (04/12 0500) BP: (110-177)/(46-100) 110/61 mmHg (04/12 0500) SpO2:  [94 %-100 %] 94 % (04/12 0500) Weight:  [64.592 kg (142 lb 6.4 oz)] 64.592 kg (142 lb 6.4 oz) (04/11 2146)  Intake/Output from previous day: 04/11 0701 - 04/12 0700 In: 3 [I.V.:3] Out: -   Scheduled: . amLODipine  5 mg Oral Daily  . aspirin EC  81 mg Oral QHS  . cilostazol  100 mg Oral BID  . digoxin  0.125 mg Oral QPM  . ezetimibe  10 mg Oral QHS  . isosorbide mononitrate  30 mg Oral Daily  . lisinopril  10 mg Oral BID  . metoprolol  50 mg Oral BID  . omega-3 acid ethyl esters  1 g Oral Daily  . pneumococcal 23 valent vaccine  0.5 mL Intramuscular Once  . simvastatin  20 mg Oral QHS  . sodium chloride  3 mL Intravenous Q12H  . warfarin  1.25 mg Oral Q7 days  . [START ON 02/23/2013] warfarin  2.5 mg Oral Custom  . Warfarin - Physician Dosing Inpatient   Does not apply q1800    Physical Exam:   General appearance: alert, cooperative and no distress Neck: no JVD and thyroid not enlarged, symmetric, no tenderness/mass/nodules Lungs: clear to auscultation bilaterally Heart: S1, S2 normal and 1/6 sem Abdomen: soft, non-tender; bowel sounds normal; no masses,  no organomegaly Extremities: no edema, redness or tenderness in the calves or thighs Neurologic: Alert and oriented X 3, normal strength and tone. Normal symmetric reflexes. Normal coordination and gait   Rate: 56  Rhythm: normal sinus rhythm  Lab Results:    Recent Labs  02/21/13 1538 02/22/13 0325  NA 136 140  K 4.3 4.5  CL 101 105  CO2 26 27  GLUCOSE 108* 94  BUN 19 17  CREATININE 0.63 0.74    Recent Labs  02/21/13 2206 02/22/13 0316  TROPONINI <0.30 <0.30    Hepatic Function Panel  Recent Labs  02/21/13 2206  PROT 6.2  ALBUMIN 3.4*  AST 28  ALT 46*  ALKPHOS 55  BILITOT 0.4  BILIDIR <0.1  IBILI NOT CALCULATED    Recent Labs  02/22/13 0317  INR 2.84*   Cardiac Panel (last 3 results)  Recent Labs  02/21/13 2206 02/22/13 0316  TROPONINI <0.30 <0.30    Lipid Panel     Component Value Date/Time   CHOL 173 02/22/2013 0325   TRIG 43 02/22/2013 0325   HDL 85 02/22/2013 0325   CHOLHDL 2.0 02/22/2013 0325   VLDL 9 02/22/2013 0325   LDLCALC 79 02/22/2013 0325     Imaging:  Dg Chest Port 1 View  02/21/2013  *RADIOLOGY REPORT*  Clinical Data: Right jaw pain  PORTABLE CHEST - 1 VIEW  Comparison: 10/18/2007  Findings: The cardiomediastinal silhouette is stable.  Status post CABG.  Atherosclerotic calcifications of thoracic aorta.  No acute infiltrate or pulmonary edema.  IMPRESSION: No active disease.  No significant change.   Original Report Authenticated By: Natasha Mead, M.D.       Assessment/Plan:   Principal Problem:   Chest pain Active Problems:   Paroxysmal atrial fibrillation, maintaining SR on coumadin    Long term (current) use of  anticoagulants   CAD (coronary artery disease) with CABG  Feels much better. Enzymes are negative. Will dc this am on toprol xl 50 mg bid. Pt had only been taking her amlodipine 1-2 x per week at 2.5 mg; therefore, will dc on 2.5 mg daily, but if BP >140 increase to 5 mg daily. Will arrange for outpatient nuclear perfusion study after Easter recess (she is going away with daughter) and I will see pt back in office in 3 weeks.     Lennette Bihari, MD, [facility] 02/22/2013, 8:11 AM

## 2013-02-22 NOTE — Discharge Summary (Signed)
Physician Discharge Summary  Patient ID: [name] MRN: [id number] DOB/AGE: 69-Jan-1947 69 y.o.  Admit date: 02/21/2013 Discharge date: 02/22/2013  Admission Diagnoses: Chest Pain   Discharge Diagnoses:  Principal Problem:   Chest pain Active Problems:   Paroxysmal atrial fibrillation, maintaining SR on coumadin    Long term (current) use of anticoagulants   CAD (coronary artery disease) with CABG   Discharged Condition: stable  Hospital Course: The patient is a 67 y/o female with hx of CAD and CABG x 3 in 09/2007 with LIMA to LAD, VG to second diag. branch of LAD and VG to PDA of the RCA. Her last NST was in 2012  and was negative for  ischemia. Recently she has had 2 episodes of PAF with RVR requiring medication changes. At her last office visit, 01/2013, she was in SR. She is on coumadin with a CHADS Vasc2 of 4. She presented to [facility] on 4/11 with a complaint of right sided jaw pain x 2 days, that eventually radiated down to her chest. She had associated SOB with exertion, along with dizziness and nausea. She checked her blood pressure at home and it was elevated at 190/90. She reports that she then took her prn amlodipine. She initially presented to the [facility] in [place] and was instructed to report to the ER. She presented to [facility] and was given ASA. Her BP had improved and she reported improvement in symptoms. Her EKG showed no acute changes and POC troponin was negative. However, she was admitted for overnight observation. Enzymes were cycled and she ultimately ruled out for MI with negative troponin's. She had no further chest pain. She was last seen and examined by Dr. Tresa Endo, who determined that she was stable for discharge home with several medication changes. She was switched from Lopressor to Toprol 50 mg BID. Her PRN amlodipine was changed to daily at 2.5 mg. She was instructed to increase to 5 mg for SBP > 140. An outpatient NST has been ordered. She will follow-up with a  MLP at [facility].     Consults: None  Significant Diagnostic Studies: none  Treatments: See Hospital Course  Discharge Exam: Blood pressure 121/65, pulse 55, temperature 97.9 F (36.6 C), temperature source Oral, resp. rate 18, height 5\' 3"  (1.6 m), weight 142 lb 6.4 oz (64.592 kg), SpO2 94.00%.   Disposition: 01-Home or Self Care  Discharge Orders   Future Orders Complete By Expires     Diet - low sodium heart healthy  As directed     Increase activity slowly  As directed         Medication List    STOP taking these medications       metoprolol 50 MG tablet  Commonly known as:  LOPRESSOR      TAKE these medications       acetaminophen 500 MG tablet  Commonly known as:  TYLENOL  Take 500 mg by mouth 2 (two) times daily as needed for pain.     amLODipine 2.5 MG tablet  Commonly known as:  NORVASC  Take 1 tablet (2.5 mg total) by mouth daily. If systolic blood pressure is > 140 increase dose to 5 mg     aspirin EC 81 MG tablet  Take 81 mg by mouth at bedtime.     cilostazol 100 MG tablet  Commonly known as:  PLETAL  Take 100 mg by mouth 2 (two) times daily.     digoxin 0.25 MG tablet  Commonly known  as:  LANOXIN  Take 0.125 mg by mouth every evening.     ezetimibe 10 MG tablet  Commonly known as:  ZETIA  Take 10 mg by mouth at bedtime.     hydrocortisone valerate cream 0.2 %  Commonly known as:  WESTCORT  Apply 1 application topically 3 (three) times a week. On random days - for eczema     isosorbide mononitrate 30 MG 24 hr tablet  Commonly known as:  IMDUR  Take 30 mg by mouth daily.     lisinopril 10 MG tablet  Commonly known as:  PRINIVIL,ZESTRIL  Take 10 mg by mouth 2 (two) times daily.     metoprolol succinate 50 MG 24 hr tablet  Commonly known as:  TOPROL-XL  Take 1 tablet (50 mg total) by mouth 2 (two) times daily. Take with or immediately following a meal.  50 mg every morning and 25-50 every evening     nitroGLYCERIN 0.4 MG SL tablet   Commonly known as:  NITROSTAT  Place 1 tablet (0.4 mg total) under the tongue every 5 (five) minutes x 3 doses as needed for chest pain.     omega-3 acid ethyl esters 1 G capsule  Commonly known as:  LOVAZA  Take 1 g by mouth daily.     pyridOXINE 100 MG tablet  Commonly known as:  VITAMIN B-6  Take 100 mg by mouth daily.     simvastatin 20 MG tablet  Commonly known as:  ZOCOR  Take 20 mg by mouth at bedtime.     SYSTANE OP  Place 1 drop into both eyes daily.     warfarin 2.5 MG tablet  Commonly known as:  COUMADIN  Take 1.25-2.5 mg by mouth every evening. 1.25 mg  (1/2 tablet) on Saturday, 2.5 mg (1 tablet) on all other days           Follow-up Information   Follow up with [facility]. (Our office will call to arrange a cardiac stress test and a follow-up appointment)    Contact information:   [address] [place] [address] [phone]     TIME SPENT ON DISCHARGE SUMMARY INCLUDING PHYSICIAN TIME: > 30 MINUTES  Signed: Allayne Butcher, PA-C 02/22/2013, 5:17 PM

## 2013-02-22 NOTE — Progress Notes (Signed)
Utilization Review Completed [place] J. Azahel Belcastro, RN, BSN, NCM [phone]  

## 2013-02-24 ENCOUNTER — Other Ambulatory Visit: Payer: Self-pay | Admitting: Cardiology

## 2013-02-24 DIAGNOSIS — R079 Chest pain, unspecified: Secondary | ICD-10-CM

## 2013-03-11 ENCOUNTER — Ambulatory Visit (HOSPITAL_COMMUNITY)
Admission: RE | Admit: 2013-03-11 | Discharge: 2013-03-11 | Disposition: A | Discharge status: Home / Self Care | Payer: Medicare Other | Source: Ambulatory Visit | Attending: Cardiovascular Disease | Admitting: Cardiovascular Disease

## 2013-03-11 DIAGNOSIS — R002 Palpitations: Secondary | ICD-10-CM | POA: Insufficient documentation

## 2013-03-11 DIAGNOSIS — R0609 Other forms of dyspnea: Secondary | ICD-10-CM | POA: Insufficient documentation

## 2013-03-11 DIAGNOSIS — R079 Chest pain, unspecified: Secondary | ICD-10-CM | POA: Insufficient documentation

## 2013-03-11 DIAGNOSIS — R5381 Other malaise: Secondary | ICD-10-CM | POA: Insufficient documentation

## 2013-03-11 DIAGNOSIS — R0989 Other specified symptoms and signs involving the circulatory and respiratory systems: Secondary | ICD-10-CM | POA: Insufficient documentation

## 2013-03-11 DIAGNOSIS — R5383 Other fatigue: Secondary | ICD-10-CM | POA: Insufficient documentation

## 2013-03-11 MED ORDER — TECHNETIUM TC 99M SESTAMIBI GENERIC - CARDIOLITE
10.3000 | Freq: Once | INTRAVENOUS | Status: AC | PRN
  Administered 2013-03-11: 10 via INTRAVENOUS

## 2013-03-11 MED ORDER — TECHNETIUM TC 99M SESTAMIBI GENERIC - CARDIOLITE
30.2000 | Freq: Once | INTRAVENOUS | Status: AC | PRN
  Administered 2013-03-11: 30.2 via INTRAVENOUS

## 2013-03-11 MED ORDER — AMINOPHYLLINE 25 MG/ML IV SOLN
75.0000 mg | Freq: Once | INTRAVENOUS | Status: AC
  Administered 2013-03-11: 75 mg via INTRAVENOUS

## 2013-03-11 MED ORDER — REGADENOSON 0.4 MG/5ML IV SOLN
0.4000 mg | Freq: Once | INTRAVENOUS | Status: AC
  Administered 2013-03-11: 0.4 mg via INTRAVENOUS

## 2013-03-11 NOTE — Procedures (Addendum)
[facility] [address] [place] [address] [phone]  Cardiology Nuclear Med Study  [name] is a 67 y.o. female     MRN : [id number]     DOB: [birth year]  Procedure Date: 03/11/2013  Nuclear Med Background Indication for Stress Test:  Graft Patency and Post Hospital History:  cad;cabg x3 --09/2007;a-fib w/RVR;AORTIC VALVE STENOSIS Cardiac Risk Factors: Family History - CAD, History of Smoking, Hypertension, Lipids, Overweight and PVD  Symptoms:  DOE, Fatigue and Palpitations   Nuclear Pre-Procedure Caffeine/Decaff Intake:  1:00am NPO After: 11 AM   IV Site: R Antecubital  IV 0.9% NS with Angio Cath:  22g  Chest Size (in):  N/A IV Started by: Emmit Pomfret, RN  Height: 5\' 4"  (1.626 m)  Cup Size: B  BMI:  Body mass index is 24.53 kg/(m^2). Weight:  143 lb (64.864 kg)   Tech Comments:  N/A    Nuclear Med Study 1 or 2 day study: 1 day  Stress Test Type:  Lexiscan  Order Authorizing Provider:  Nicki Guadalajara, MD   Resting Radionuclide: Technetium 63m Sestamibi  Resting Radionuclide Dose: 10.3 mCi   Stress Radionuclide:  Technetium 40m Sestamibi  Stress Radionuclide Dose: 30.2 mCi           Stress Protocol Rest HR: 62 Stress HR: 80  Rest BP: 167/90 Stress BP: 170/99  Exercise Time (min): n/a METS: n/a          Dose of Adenosine (mg):  n/a Dose of Lexiscan: 0.4 mg  Dose of Atropine (mg): n/a Dose of Dobutamine: n/a mcg/kg/min (at max HR)  Stress Test Technologist: Ernestene Mention, CCT Nuclear Technologist: Gonzella Lex, CNMT   Rest Procedure:  Myocardial perfusion imaging was performed at rest 45 minutes following the intravenous administration of Technetium 54m Sestamibi. Stress Procedure:  The patient received IV Lexiscan 0.4 mg over 15-seconds.  Technetium 28m Sestamibi injected at 30-seconds. Due to patient's shortness of breath and the feeling of heaviness all over, she was given IV Aminophylline 75 mg.  Symptoms were resolved during recovery. There were no significant changes with Lexiscan.  Quantitative spect images were obtained after a 45 minute delay.  Transient Ischemic Dilatation (Normal <1.22):  1.15 Lung/Heart Ratio (Normal <0.45):  0.26 QGS EDV:  77 ml QGS ESV:  20 ml LV Ejection Fraction: 74%     Rest ECG: NSR. Poor anterior R wave progression  Stress ECG: No significant change from baseline ECG  QPS Raw Data Images:  Normal; no motion artifact; normal heart/lung ratio. Stress Images:  Normal homogeneous uptake in all areas of the myocardium. Rest Images:  Normal homogeneous uptake in all areas of the myocardium. Subtraction (SDS):  No evidence of ischemia.  Impression Exercise Capacity:  Lexiscan with no exercise. BP Response:  HTN at baseline Clinical Symptoms:  No significant symptoms noted. ECG Impression:  No significant ECG changes with Lexiscan. Comparison with Prior Nuclear Study: No significant change from previous study  Overall Impression:  Normal stress nuclear study.  LV Wall Motion:  NL LV Function; NL Wall Motion   Kiyon Fidalgo, MD  03/11/2013 7:03 PM

## 2013-03-25 ENCOUNTER — Ambulatory Visit: Payer: Medicare Other | Admitting: Pharmacist Clinician (PhC)/ Clinical Pharmacy Specialist

## 2013-04-03 ENCOUNTER — Ambulatory Visit (INDEPENDENT_AMBULATORY_CARE_PROVIDER_SITE_OTHER): Payer: Medicare Other | Admitting: Cardiovascular Disease

## 2013-04-03 ENCOUNTER — Encounter: Payer: Self-pay | Admitting: Cardiovascular Disease

## 2013-04-03 ENCOUNTER — Ambulatory Visit (INDEPENDENT_AMBULATORY_CARE_PROVIDER_SITE_OTHER): Payer: Medicare Other | Admitting: Pharmacist Clinician (PhC)/ Clinical Pharmacy Specialist

## 2013-04-03 VITALS — BP 150/80 | HR 60 | Ht 63.0 in | Wt 141.6 lb

## 2013-04-03 DIAGNOSIS — Z7901 Long term (current) use of anticoagulants: Secondary | ICD-10-CM

## 2013-04-03 DIAGNOSIS — I4891 Unspecified atrial fibrillation: Secondary | ICD-10-CM

## 2013-04-03 DIAGNOSIS — I480 Paroxysmal atrial fibrillation: Secondary | ICD-10-CM

## 2013-04-03 DIAGNOSIS — R079 Chest pain, unspecified: Secondary | ICD-10-CM

## 2013-04-03 DIAGNOSIS — I2510 Atherosclerotic heart disease of native coronary artery without angina pectoris: Secondary | ICD-10-CM

## 2013-04-03 LAB — POCT INR: INR: 1.8

## 2013-04-03 MED ORDER — AMLODIPINE BESYLATE 2.5 MG PO TABS: 2.5000 mg | ORAL_TABLET | Freq: Every day | ORAL | Status: DC

## 2013-04-03 NOTE — Patient Instructions (Signed)
Your physician has recommended you make the following change in your medication: Take your amlodipine daily.  Your physician recommends that you schedule a follow-up appointment in: 4 months

## 2013-04-22 ENCOUNTER — Encounter: Payer: Self-pay | Admitting: Cardiovascular Disease
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
## 2013-04-24 ENCOUNTER — Ambulatory Visit (INDEPENDENT_AMBULATORY_CARE_PROVIDER_SITE_OTHER): Payer: Medicare Other | Admitting: Pharmacist Clinician (PhC)/ Clinical Pharmacy Specialist

## 2013-04-24 VITALS — BP 128/80 | HR 68

## 2013-04-24 DIAGNOSIS — I4891 Unspecified atrial fibrillation: Secondary | ICD-10-CM

## 2013-04-24 DIAGNOSIS — Z7901 Long term (current) use of anticoagulants: Secondary | ICD-10-CM

## 2013-04-24 DIAGNOSIS — I480 Paroxysmal atrial fibrillation: Secondary | ICD-10-CM

## 2013-04-24 LAB — POCT INR: INR: 1.6

## 2013-05-02 ENCOUNTER — Ambulatory Visit (INDEPENDENT_AMBULATORY_CARE_PROVIDER_SITE_OTHER): Payer: Medicare Other | Admitting: Pharmacist Clinician (PhC)/ Clinical Pharmacy Specialist

## 2013-05-02 ENCOUNTER — Telehealth: Payer: Self-pay | Admitting: Cardiovascular Disease

## 2013-05-02 VITALS — BP 136/68 | HR 68

## 2013-05-02 DIAGNOSIS — Z7901 Long term (current) use of anticoagulants: Secondary | ICD-10-CM

## 2013-05-02 DIAGNOSIS — I480 Paroxysmal atrial fibrillation: Secondary | ICD-10-CM

## 2013-05-02 DIAGNOSIS — I4891 Unspecified atrial fibrillation: Secondary | ICD-10-CM

## 2013-05-02 LAB — POCT INR: INR: 2.7

## 2013-05-02 NOTE — Telephone Encounter (Signed)
Message forwarded to W. Waddell, CMA.  

## 2013-05-02 NOTE — Telephone Encounter (Signed)
Mrs.Audi is wanting to know if there is a generic brand or something similar to the Digoxin  Because her insurance no longer covered the digoxin or its needs prior approval from the doctor.Marland KitchenMarland KitchenPlease call.[name]

## 2013-05-08 ENCOUNTER — Ambulatory Visit: Payer: Medicare Other | Admitting: Pharmacist Clinician (PhC)/ Clinical Pharmacy Specialist

## 2013-05-12 ENCOUNTER — Other Ambulatory Visit: Payer: Self-pay | Admitting: Cardiovascular Disease

## 2013-05-12 NOTE — Telephone Encounter (Signed)
Pt returned call.  Stated she didn't know she needed a prior authorization for digoxin.  Stated when she requested her refill, they told her she needed PA.  Pt informed PA received for Lanoxin and RN will call pharmacy to find get more info.    Call to [facility] pharmacy and informed pt had been paying out of pocket at $28.86 for Rx and it has gone up to $44 so they ran her insurance, which now requires PA for digoxin.    Discussed w/ L. Kilroy, PA-C and completed PA form.    Call to pt and informed.  Also informed RN will fax PA to initiate that process.  Pt verbalized understanding and agreed w/ plan.

## 2013-05-12 NOTE — Telephone Encounter (Signed)
Rx was called in to pharmacy. 

## 2013-05-12 NOTE — Telephone Encounter (Signed)
Still waiting to hear from somebody!

## 2013-05-12 NOTE — Telephone Encounter (Signed)
Returned call.  Left message to call back before 4pm and samples may be available.  Form has been started and needs signature since Dr. Tresa Endo is out of office x 1 week.  Will await return call.

## 2013-05-12 NOTE — Telephone Encounter (Signed)
Please call-wants to make sure you have the right date-it will nee to be be back dated!

## 2013-05-12 NOTE — Telephone Encounter (Signed)
Calling you back about authorization!

## 2013-05-13 ENCOUNTER — Other Ambulatory Visit: Payer: Self-pay | Admitting: *Deleted

## 2013-05-13 ENCOUNTER — Telehealth: Payer: Self-pay | Admitting: Cardiovascular Disease

## 2013-05-13 ENCOUNTER — Telehealth: Payer: Self-pay | Admitting: *Deleted

## 2013-05-13 MED ORDER — DIGOXIN 125 MCG PO TABS: 0.1250 mg | ORAL_TABLET | Freq: Every day | ORAL | Status: DC

## 2013-05-13 NOTE — Telephone Encounter (Signed)
Returned call.  Pt stated she called the pharmacy yesterday and talked w/ Cala Bradford.  Stated she was told that they needed a copy of the PA faxed to them stating pt initially filled Rx March 2014.  Stated they want to send it to corporate office so she can get a refund.  Pt stated she wasn't sure of all they were talking about.  Pt asked RN to resend PA form to [employer] w/ comment that Rx was written March 2014.  Pt informed comment will be added and refaxed.    Fax received from [facility] prior to refaxing form.  No PA needed.  Call to pt and informed.  Pt informed all info will be faxed to pharmacy.  Pt requested it be sent to Kimberly's attention.  All PA forms faxed to pharmacy and to the attention of [place].

## 2013-05-13 NOTE — Telephone Encounter (Signed)
Cala Bradford called back.  Stated pt is asking if PA for digoxin 0.25mg  can be done.  Stated she wants to be able to get reimbursed for what she has previously paid.  Informed Cala Bradford, Dr. Landry Dyke CMA will be notified to process when she returns.  Verbalized understanding.

## 2013-05-13 NOTE — Telephone Encounter (Signed)
Wants you to call-concerning  The medicine you working on yesterday!

## 2013-05-13 NOTE — Telephone Encounter (Signed)
[name] from pharmacy called back.  Stated PA was needed for Digoxin 0.25mg  and not 0.125mg .  Informed RN will resend Rx w/ lower strength since PA not needed and pt is taking 0.125mg  daily.  [name] verbalized understanding and stated she will not be able to back pay pt for med r/t Rx being written for 0.25mg .  Informed she will need to discuss w/ pt and agreed.  Refill(s) sent to pharmacy.

## 2013-05-14 NOTE — Telephone Encounter (Signed)
PA completed and faxed back to insurance company today.

## 2013-05-14 NOTE — Telephone Encounter (Signed)
PA completed and sent to insurance company.

## 2013-05-14 NOTE — Telephone Encounter (Signed)
Need to talk to you about the prior authorization for her Digxion!

## 2013-05-14 NOTE — Telephone Encounter (Signed)
Returned call to [facility].  Needed to know how long pt has been on medication and informed pt has been on digoxin since March 2014.  Also needed to know how often pt will come into office and informed f/u appt will be for 4 months.  Verbalized understanding.  Stated he will send info to Review and a fax should be received today before the close of business.

## 2013-05-17 ENCOUNTER — Other Ambulatory Visit: Payer: Self-pay | Admitting: Cardiovascular Disease

## 2013-05-19 ENCOUNTER — Telehealth: Payer: Self-pay | Admitting: Cardiovascular Disease

## 2013-05-19 ENCOUNTER — Other Ambulatory Visit: Payer: Self-pay | Admitting: Cardiovascular Disease

## 2013-05-19 MED ORDER — CILOSTAZOL 100 MG PO TABS: 100.0000 mg | ORAL_TABLET | Freq: Two times a day (BID) | ORAL | Status: DC

## 2013-05-19 MED ORDER — DIGOXIN 250 MCG PO TABS: 0.1250 mg | ORAL_TABLET | Freq: Every day | ORAL | Status: DC

## 2013-05-19 NOTE — Telephone Encounter (Signed)
Rx resent to pharmacy for digoxin 0.25mg  tabs: take 0.125mg  (1/2 tab) daily.

## 2013-05-19 NOTE — Telephone Encounter (Signed)
Patient requests that the RX for lanoxin dated 01/23/13 be re-written for 0.125mg  so that she can be reimbursed for her out of pocket expense. States that her insurance company will not cover the 0.25 mg tablets, but they will cover the 0.125 mg which is what she ended up taking. She states that Dr. Tresa Endo was trying to give her a loading dose and titrate her up to her needed dose. Informed her that I will need to speak with Dr. Tresa Endo before i can re-write the prescription. Once he gives me the okay, we will end the new prescription to her in the mail.

## 2013-05-19 NOTE — Telephone Encounter (Signed)
[name] is wanting to speak to you concerning medication that was written incorrectly .[name] Please Call   Thanks

## 2013-05-21 ENCOUNTER — Telehealth: Payer: Self-pay | Admitting: Pharmacist Clinician (PhC)/ Clinical Pharmacy Specialist

## 2013-05-21 NOTE — Telephone Encounter (Signed)
Forgot to take her Warfarin last night-she took it at 1:00 today-should she take it again tonight?

## 2013-05-21 NOTE — Telephone Encounter (Signed)
Advised pt to go ahead and take dose tonight as normal.  Pt voiced understanding.

## 2013-05-28 ENCOUNTER — Ambulatory Visit (INDEPENDENT_AMBULATORY_CARE_PROVIDER_SITE_OTHER): Payer: Medicare Other | Admitting: Pharmacist Clinician (PhC)/ Clinical Pharmacy Specialist

## 2013-05-28 VITALS — BP 136/80 | HR 52

## 2013-05-28 DIAGNOSIS — Z7901 Long term (current) use of anticoagulants: Secondary | ICD-10-CM

## 2013-05-28 DIAGNOSIS — I480 Paroxysmal atrial fibrillation: Secondary | ICD-10-CM

## 2013-05-28 DIAGNOSIS — I4891 Unspecified atrial fibrillation: Secondary | ICD-10-CM

## 2013-05-28 LAB — POCT INR: INR: 3.9

## 2013-05-30 ENCOUNTER — Ambulatory Visit: Payer: Medicare Other | Admitting: Pharmacist Clinician (PhC)/ Clinical Pharmacy Specialist

## 2013-06-12 ENCOUNTER — Ambulatory Visit (INDEPENDENT_AMBULATORY_CARE_PROVIDER_SITE_OTHER): Payer: Medicare Other | Admitting: Pharmacist Clinician (PhC)/ Clinical Pharmacy Specialist

## 2013-06-12 DIAGNOSIS — Z7901 Long term (current) use of anticoagulants: Secondary | ICD-10-CM

## 2013-06-12 DIAGNOSIS — I480 Paroxysmal atrial fibrillation: Secondary | ICD-10-CM

## 2013-06-12 DIAGNOSIS — I4891 Unspecified atrial fibrillation: Secondary | ICD-10-CM

## 2013-06-12 LAB — POCT INR: INR: 3.7

## 2013-06-16 ENCOUNTER — Telehealth: Payer: Self-pay | Admitting: Pharmacist Clinician (PhC)/ Clinical Pharmacy Specialist

## 2013-06-16 NOTE — Telephone Encounter (Signed)
Spoke with pt.  She wants to Curamin for inflammation.  She is concerned it may increase INR.  Explained to pt that we do not have a lot of information regarding herbal supplements.  Will check her INR next week to make sure there is no interference.

## 2013-06-16 NOTE — Telephone Encounter (Signed)
Pt wanted to talk to [place], but I told her that she was not here. So she asked to speak to [facility]. Pt is calling regarding curamin for pain and wanted to know if that will thin the blood.

## 2013-06-17 ENCOUNTER — Other Ambulatory Visit (HOSPITAL_COMMUNITY): Payer: Self-pay | Admitting: *Deleted

## 2013-06-17 DIAGNOSIS — M545 Low back pain, unspecified: Secondary | ICD-10-CM

## 2013-06-23 ENCOUNTER — Ambulatory Visit (INDEPENDENT_AMBULATORY_CARE_PROVIDER_SITE_OTHER): Payer: Medicare Other | Admitting: Pharmacist Clinician (PhC)/ Clinical Pharmacy Specialist

## 2013-06-23 ENCOUNTER — Ambulatory Visit (HOSPITAL_COMMUNITY)
Admission: RE | Admit: 2013-06-23 | Discharge: 2013-06-23 | Disposition: A | Discharge status: Home / Self Care | Payer: Medicare Other | Source: Ambulatory Visit | Attending: *Deleted | Admitting: *Deleted

## 2013-06-23 ENCOUNTER — Ambulatory Visit (HOSPITAL_COMMUNITY)
Admission: RE | Admit: 2013-06-23 | Discharge: 2013-06-23 | Disposition: A | Discharge status: Home / Self Care | Payer: Medicare Other | Source: Ambulatory Visit | Attending: Physician Assistant | Admitting: Physician Assistant

## 2013-06-23 VITALS — BP 132/86 | HR 52

## 2013-06-23 DIAGNOSIS — M545 Low back pain, unspecified: Secondary | ICD-10-CM

## 2013-06-23 DIAGNOSIS — S22009A Unspecified fracture of unspecified thoracic vertebra, initial encounter for closed fracture: Secondary | ICD-10-CM | POA: Insufficient documentation

## 2013-06-23 DIAGNOSIS — M47817 Spondylosis without myelopathy or radiculopathy, lumbosacral region: Secondary | ICD-10-CM | POA: Insufficient documentation

## 2013-06-23 DIAGNOSIS — I480 Paroxysmal atrial fibrillation: Secondary | ICD-10-CM

## 2013-06-23 DIAGNOSIS — M5000 Cervical disc disorder with myelopathy, unspecified cervical region: Secondary | ICD-10-CM | POA: Insufficient documentation

## 2013-06-23 DIAGNOSIS — Z7901 Long term (current) use of anticoagulants: Secondary | ICD-10-CM

## 2013-06-23 DIAGNOSIS — I4891 Unspecified atrial fibrillation: Secondary | ICD-10-CM

## 2013-06-23 DIAGNOSIS — R609 Edema, unspecified: Secondary | ICD-10-CM | POA: Insufficient documentation

## 2013-06-23 DIAGNOSIS — K5730 Diverticulosis of large intestine without perforation or abscess without bleeding: Secondary | ICD-10-CM | POA: Insufficient documentation

## 2013-06-23 DIAGNOSIS — K7689 Other specified diseases of liver: Secondary | ICD-10-CM | POA: Insufficient documentation

## 2013-06-23 DIAGNOSIS — M5124 Other intervertebral disc displacement, thoracic region: Secondary | ICD-10-CM | POA: Insufficient documentation

## 2013-06-23 LAB — POCT INR: INR: 4.3

## 2013-07-01 ENCOUNTER — Ambulatory Visit: Payer: Medicare Other | Admitting: Pharmacist Clinician (PhC)/ Clinical Pharmacy Specialist

## 2013-07-03 ENCOUNTER — Ambulatory Visit (INDEPENDENT_AMBULATORY_CARE_PROVIDER_SITE_OTHER): Payer: Medicare Other | Admitting: Pharmacist Clinician (PhC)/ Clinical Pharmacy Specialist

## 2013-07-03 VITALS — BP 148/82 | HR 60

## 2013-07-03 DIAGNOSIS — I4891 Unspecified atrial fibrillation: Secondary | ICD-10-CM

## 2013-07-03 DIAGNOSIS — Z7901 Long term (current) use of anticoagulants: Secondary | ICD-10-CM

## 2013-07-03 DIAGNOSIS — I480 Paroxysmal atrial fibrillation: Secondary | ICD-10-CM

## 2013-07-03 LAB — POCT INR: INR: 1.5

## 2013-07-10 ENCOUNTER — Other Ambulatory Visit (HOSPITAL_COMMUNITY): Payer: Self-pay

## 2013-07-10 DIAGNOSIS — M533 Sacrococcygeal disorders, not elsewhere classified: Secondary | ICD-10-CM

## 2013-07-10 DIAGNOSIS — M545 Low back pain, unspecified: Secondary | ICD-10-CM

## 2013-07-10 DIAGNOSIS — S3210XA Unspecified fracture of sacrum, initial encounter for closed fracture: Secondary | ICD-10-CM

## 2013-07-10 DIAGNOSIS — W19XXXA Unspecified fall, initial encounter: Secondary | ICD-10-CM

## 2013-07-17 ENCOUNTER — Other Ambulatory Visit (HOSPITAL_COMMUNITY): Payer: Self-pay | Admitting: *Deleted

## 2013-07-17 ENCOUNTER — Ambulatory Visit (INDEPENDENT_AMBULATORY_CARE_PROVIDER_SITE_OTHER): Payer: Medicare Other | Admitting: Pharmacist Clinician (PhC)/ Clinical Pharmacy Specialist

## 2013-07-17 VITALS — BP 140/80 | HR 76

## 2013-07-17 DIAGNOSIS — I480 Paroxysmal atrial fibrillation: Secondary | ICD-10-CM

## 2013-07-17 DIAGNOSIS — M533 Sacrococcygeal disorders, not elsewhere classified: Secondary | ICD-10-CM

## 2013-07-17 DIAGNOSIS — Z7901 Long term (current) use of anticoagulants: Secondary | ICD-10-CM

## 2013-07-17 DIAGNOSIS — M8448XK Pathological fracture, other site, subsequent encounter for fracture with nonunion: Secondary | ICD-10-CM

## 2013-07-17 DIAGNOSIS — M545 Low back pain, unspecified: Secondary | ICD-10-CM

## 2013-07-17 DIAGNOSIS — I4891 Unspecified atrial fibrillation: Secondary | ICD-10-CM

## 2013-07-17 DIAGNOSIS — R52 Pain, unspecified: Secondary | ICD-10-CM

## 2013-07-17 LAB — POCT INR: INR: 2.1

## 2013-07-21 ENCOUNTER — Ambulatory Visit (INDEPENDENT_AMBULATORY_CARE_PROVIDER_SITE_OTHER): Payer: Medicare Other | Admitting: Cardiovascular Disease

## 2013-07-21 ENCOUNTER — Ambulatory Visit (INDEPENDENT_AMBULATORY_CARE_PROVIDER_SITE_OTHER): Payer: Medicare Other | Admitting: Pharmacist Clinician (PhC)/ Clinical Pharmacy Specialist

## 2013-07-21 ENCOUNTER — Encounter: Payer: Self-pay | Admitting: Cardiovascular Disease

## 2013-07-21 VITALS — BP 158/82 | Ht 63.0 in | Wt 146.4 lb

## 2013-07-21 DIAGNOSIS — E782 Mixed hyperlipidemia: Secondary | ICD-10-CM

## 2013-07-21 DIAGNOSIS — I480 Paroxysmal atrial fibrillation: Secondary | ICD-10-CM

## 2013-07-21 DIAGNOSIS — I4891 Unspecified atrial fibrillation: Secondary | ICD-10-CM

## 2013-07-21 DIAGNOSIS — R079 Chest pain, unspecified: Secondary | ICD-10-CM

## 2013-07-21 DIAGNOSIS — R5381 Other malaise: Secondary | ICD-10-CM

## 2013-07-21 DIAGNOSIS — R5383 Other fatigue: Secondary | ICD-10-CM

## 2013-07-21 DIAGNOSIS — Z7901 Long term (current) use of anticoagulants: Secondary | ICD-10-CM

## 2013-07-21 DIAGNOSIS — I2510 Atherosclerotic heart disease of native coronary artery without angina pectoris: Secondary | ICD-10-CM

## 2013-07-21 LAB — POCT INR: INR: 2.7
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
## 2013-07-21 NOTE — Patient Instructions (Addendum)
Your physician recommends that you return for lab work in: 1-2 weeks fasting.  Your physician recommends that you schedule a follow-up appointment in: 3 MONTHS.

## 2013-07-22 ENCOUNTER — Other Ambulatory Visit (HOSPITAL_COMMUNITY): Payer: Self-pay

## 2013-07-22 DIAGNOSIS — M532X8 Spinal instabilities, sacral and sacrococcygeal region: Secondary | ICD-10-CM

## 2013-07-22 DIAGNOSIS — M545 Low back pain, unspecified: Secondary | ICD-10-CM

## 2013-07-23 ENCOUNTER — Other Ambulatory Visit (HOSPITAL_COMMUNITY): Payer: Self-pay

## 2013-07-23 ENCOUNTER — Ambulatory Visit (HOSPITAL_COMMUNITY)
Admission: RE | Admit: 2013-07-23 | Discharge: 2013-07-23 | Disposition: A | Discharge status: Home / Self Care | Payer: Medicare Other | Source: Ambulatory Visit | Attending: Neurosurgery | Admitting: Neurosurgery

## 2013-07-23 DIAGNOSIS — K5730 Diverticulosis of large intestine without perforation or abscess without bleeding: Secondary | ICD-10-CM | POA: Insufficient documentation

## 2013-07-23 DIAGNOSIS — I708 Atherosclerosis of other arteries: Secondary | ICD-10-CM | POA: Insufficient documentation

## 2013-07-23 DIAGNOSIS — M545 Low back pain, unspecified: Secondary | ICD-10-CM

## 2013-07-23 DIAGNOSIS — M25559 Pain in unspecified hip: Secondary | ICD-10-CM

## 2013-07-23 DIAGNOSIS — M549 Dorsalgia, unspecified: Secondary | ICD-10-CM | POA: Insufficient documentation

## 2013-07-23 DIAGNOSIS — M532X8 Spinal instabilities, sacral and sacrococcygeal region: Secondary | ICD-10-CM

## 2013-08-04 ENCOUNTER — Ambulatory Visit: Payer: Medicare Other | Admitting: Cardiovascular Disease

## 2013-08-11 ENCOUNTER — Ambulatory Visit (INDEPENDENT_AMBULATORY_CARE_PROVIDER_SITE_OTHER): Payer: Medicare Other | Admitting: Pharmacist Clinician (PhC)/ Clinical Pharmacy Specialist

## 2013-08-11 VITALS — BP 118/78 | HR 72

## 2013-08-11 DIAGNOSIS — I4891 Unspecified atrial fibrillation: Secondary | ICD-10-CM

## 2013-08-11 DIAGNOSIS — Z7901 Long term (current) use of anticoagulants: Secondary | ICD-10-CM

## 2013-08-11 DIAGNOSIS — I480 Paroxysmal atrial fibrillation: Secondary | ICD-10-CM

## 2013-08-11 LAB — POCT INR: INR: 2.9

## 2013-09-04 LAB — CBC
HCT: 40.9 % (ref 36.0–46.0)
Hemoglobin: 13.5 g/dL (ref 12.0–15.0)
MCH: 30.8 pg (ref 26.0–34.0)
MCHC: 33 g/dL (ref 30.0–36.0)
MCV: 93.4 fL (ref 78.0–100.0)
Platelets: 263 10*3/uL (ref 150–400)
RBC: 4.38 MIL/uL (ref 3.87–5.11)
RDW: 14 % (ref 11.5–15.5)
WBC: 6.4 10*3/uL (ref 4.0–10.5)

## 2013-09-04 LAB — COMPREHENSIVE METABOLIC PANEL
ALT: 22 U/L (ref 0–35)
AST: 22 U/L (ref 0–37)
Albumin: 4.1 g/dL (ref 3.5–5.2)
Alkaline Phosphatase: 48 U/L (ref 39–117)
BUN: 17 mg/dL (ref 6–23)
CO2: 29 mEq/L (ref 19–32)
Calcium: 8.7 mg/dL (ref 8.4–10.5)
Chloride: 101 mEq/L (ref 96–112)
Creat: 0.69 mg/dL (ref 0.50–1.10)
Glucose, Bld: 86 mg/dL (ref 70–99)
Potassium: 4.6 mEq/L (ref 3.5–5.3)
Sodium: 136 mEq/L (ref 135–145)
Total Bilirubin: 0.4 mg/dL (ref 0.3–1.2)
Total Protein: 6.2 g/dL (ref 6.0–8.3)

## 2013-09-04 LAB — TSH: TSH: 1.527 u[IU]/mL (ref 0.350–4.500)

## 2013-09-05 LAB — NMR LIPOPROFILE WITH LIPIDS
Cholesterol, Total: 168 mg/dL (ref <200)
HDL Particle Number: 48.2 umol/L (ref >=30.5)
HDL Size: 9.8 nm (ref >=9.2)
HDL-C: 79 mg/dL (ref >=40)
LDL (calc): 79 mg/dL (ref <100)
LDL Particle Number: 878 nmol/L (ref <1000)
LDL Size: 21.5 nm (ref >20.5)
LP-IR Score: <25 (ref <=45)
Large HDL-P: 13.4 umol/L (ref >=4.8)
Large VLDL-P: 1.5 nmol/L (ref <=2.7)
Small LDL Particle Number: 270 nmol/L (ref <=527)
Triglycerides: 49 mg/dL (ref <150)
VLDL Size: 53.9 nm — ABNORMAL HIGH (ref <=46.6)

## 2013-09-08 ENCOUNTER — Ambulatory Visit (INDEPENDENT_AMBULATORY_CARE_PROVIDER_SITE_OTHER): Payer: Medicare Other | Admitting: Pharmacist Clinician (PhC)/ Clinical Pharmacy Specialist

## 2013-09-08 ENCOUNTER — Telehealth: Payer: Self-pay | Admitting: Cardiovascular Disease

## 2013-09-08 VITALS — BP 136/84 | HR 76

## 2013-09-08 DIAGNOSIS — I4891 Unspecified atrial fibrillation: Secondary | ICD-10-CM

## 2013-09-08 DIAGNOSIS — Z7901 Long term (current) use of anticoagulants: Secondary | ICD-10-CM

## 2013-09-08 DIAGNOSIS — I480 Paroxysmal atrial fibrillation: Secondary | ICD-10-CM

## 2013-09-08 LAB — POCT INR: INR: 2.3

## 2013-09-17 ENCOUNTER — Encounter: Payer: Self-pay | Admitting: *Deleted

## 2013-09-17 NOTE — Progress Notes (Signed)
Quick Note:    Letter sent to patient.  ______

## 2013-09-22 ENCOUNTER — Other Ambulatory Visit: Payer: Self-pay | Admitting: Cardiovascular Disease

## 2013-10-07 ENCOUNTER — Ambulatory Visit (INDEPENDENT_AMBULATORY_CARE_PROVIDER_SITE_OTHER): Payer: Medicare Other | Admitting: Pharmacist Clinician (PhC)/ Clinical Pharmacy Specialist

## 2013-10-07 VITALS — BP 154/86 | HR 64

## 2013-10-07 DIAGNOSIS — I480 Paroxysmal atrial fibrillation: Secondary | ICD-10-CM

## 2013-10-07 DIAGNOSIS — Z7901 Long term (current) use of anticoagulants: Secondary | ICD-10-CM

## 2013-10-07 DIAGNOSIS — I4891 Unspecified atrial fibrillation: Secondary | ICD-10-CM

## 2013-10-07 LAB — POCT INR: INR: 1.8

## 2013-10-21 ENCOUNTER — Ambulatory Visit: Payer: Medicare Other | Admitting: Pharmacist Clinician (PhC)/ Clinical Pharmacy Specialist

## 2013-10-21 ENCOUNTER — Ambulatory Visit (INDEPENDENT_AMBULATORY_CARE_PROVIDER_SITE_OTHER): Payer: Medicare Other | Admitting: Cardiovascular Disease

## 2013-10-21 ENCOUNTER — Encounter: Payer: Self-pay | Admitting: Cardiovascular Disease

## 2013-10-21 VITALS — BP 130/86 | HR 62 | Ht 63.0 in | Wt 148.8 lb

## 2013-10-21 DIAGNOSIS — M899 Disorder of bone, unspecified: Secondary | ICD-10-CM

## 2013-10-21 DIAGNOSIS — Z7901 Long term (current) use of anticoagulants: Secondary | ICD-10-CM

## 2013-10-21 DIAGNOSIS — M8580 Other specified disorders of bone density and structure, unspecified site: Secondary | ICD-10-CM

## 2013-10-21 DIAGNOSIS — I10 Essential (primary) hypertension: Secondary | ICD-10-CM

## 2013-10-21 DIAGNOSIS — I2510 Atherosclerotic heart disease of native coronary artery without angina pectoris: Secondary | ICD-10-CM

## 2013-10-21 DIAGNOSIS — E785 Hyperlipidemia, unspecified: Secondary | ICD-10-CM

## 2013-10-21 DIAGNOSIS — I480 Paroxysmal atrial fibrillation: Secondary | ICD-10-CM

## 2013-10-21 DIAGNOSIS — I4891 Unspecified atrial fibrillation: Secondary | ICD-10-CM

## 2013-10-21 NOTE — Patient Instructions (Signed)
Your physician recommends that you schedule a follow-up appointment in: 6 months  

## 2013-11-03 ENCOUNTER — Encounter: Payer: Self-pay | Admitting: Cardiovascular Disease

## 2013-11-03 DIAGNOSIS — I10 Essential (primary) hypertension: Secondary | ICD-10-CM | POA: Insufficient documentation

## 2013-11-03 DIAGNOSIS — E785 Hyperlipidemia, unspecified: Secondary | ICD-10-CM | POA: Insufficient documentation

## 2013-11-03 DIAGNOSIS — M8580 Other specified disorders of bone density and structure, unspecified site: Secondary | ICD-10-CM | POA: Insufficient documentation
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
## 2013-11-04 ENCOUNTER — Ambulatory Visit: Payer: Medicare Other | Admitting: Pharmacist Clinician (PhC)/ Clinical Pharmacy Specialist

## 2013-11-04 ENCOUNTER — Ambulatory Visit (INDEPENDENT_AMBULATORY_CARE_PROVIDER_SITE_OTHER): Payer: Medicare Other | Admitting: Pharmacist Clinician (PhC)/ Clinical Pharmacy Specialist

## 2013-11-04 VITALS — BP 140/82 | HR 76

## 2013-11-04 DIAGNOSIS — Z7901 Long term (current) use of anticoagulants: Secondary | ICD-10-CM

## 2013-11-04 DIAGNOSIS — I4891 Unspecified atrial fibrillation: Secondary | ICD-10-CM

## 2013-11-04 DIAGNOSIS — I480 Paroxysmal atrial fibrillation: Secondary | ICD-10-CM

## 2013-11-04 LAB — POCT INR: INR: 1.9

## 2013-11-12 ENCOUNTER — Encounter: Payer: Self-pay | Admitting: Cardiovascular Disease

## 2013-11-23 ENCOUNTER — Other Ambulatory Visit: Payer: Self-pay | Admitting: Cardiovascular Disease

## 2013-11-24 NOTE — Telephone Encounter (Signed)
Rx was sent to pharmacy electronically. 

## 2013-12-03 ENCOUNTER — Emergency Department (HOSPITAL_COMMUNITY): Payer: Medicare Other

## 2013-12-03 ENCOUNTER — Inpatient Hospital Stay (HOSPITAL_COMMUNITY)
Admission: EM | Admit: 2013-12-03 | Discharge: 2013-12-07 | DRG: 392 | Disposition: A | Discharge status: Home / Self Care | Payer: Medicare Other | Attending: Internal Medicine | Admitting: Internal Medicine

## 2013-12-03 ENCOUNTER — Encounter (HOSPITAL_COMMUNITY): Payer: Self-pay | Admitting: Emergency Medicine

## 2013-12-03 DIAGNOSIS — K630 Abscess of intestine: Secondary | ICD-10-CM | POA: Diagnosis present

## 2013-12-03 DIAGNOSIS — Z7901 Long term (current) use of anticoagulants: Secondary | ICD-10-CM

## 2013-12-03 DIAGNOSIS — Z87891 Personal history of nicotine dependence: Secondary | ICD-10-CM

## 2013-12-03 DIAGNOSIS — M3500 Sicca syndrome, unspecified: Secondary | ICD-10-CM | POA: Diagnosis present

## 2013-12-03 DIAGNOSIS — I4891 Unspecified atrial fibrillation: Secondary | ICD-10-CM | POA: Diagnosis present

## 2013-12-03 DIAGNOSIS — I739 Peripheral vascular disease, unspecified: Secondary | ICD-10-CM | POA: Diagnosis present

## 2013-12-03 DIAGNOSIS — I252 Old myocardial infarction: Secondary | ICD-10-CM

## 2013-12-03 DIAGNOSIS — K5732 Diverticulitis of large intestine without perforation or abscess without bleeding: Principal | ICD-10-CM

## 2013-12-03 DIAGNOSIS — I10 Essential (primary) hypertension: Secondary | ICD-10-CM | POA: Diagnosis present

## 2013-12-03 DIAGNOSIS — I2510 Atherosclerotic heart disease of native coronary artery without angina pectoris: Secondary | ICD-10-CM | POA: Diagnosis present

## 2013-12-03 DIAGNOSIS — Z951 Presence of aortocoronary bypass graft: Secondary | ICD-10-CM

## 2013-12-03 DIAGNOSIS — E7800 Pure hypercholesterolemia, unspecified: Secondary | ICD-10-CM | POA: Diagnosis present

## 2013-12-03 DIAGNOSIS — K5792 Diverticulitis of intestine, part unspecified, without perforation or abscess without bleeding: Secondary | ICD-10-CM | POA: Diagnosis present

## 2013-12-03 LAB — URINALYSIS, ROUTINE W REFLEX MICROSCOPIC
Bilirubin Urine: NEGATIVE
Glucose, UA: NEGATIVE mg/dL
Ketones, ur: NEGATIVE mg/dL
Leukocytes, UA: NEGATIVE
Nitrite: NEGATIVE
Protein, ur: NEGATIVE mg/dL
Specific Gravity, Urine: 1.008 (ref 1.005–1.030)
Urobilinogen, UA: 0.2 mg/dL (ref 0.0–1.0)
pH: 6 (ref 5.0–8.0)

## 2013-12-03 LAB — CBC WITH DIFFERENTIAL/PLATELET
Basophils Absolute: 0 10*3/uL (ref 0.0–0.1)
Basophils Relative: 0 % (ref 0–1)
Eosinophils Absolute: 0 10*3/uL (ref 0.0–0.7)
Eosinophils Relative: 0 % (ref 0–5)
HCT: 40.7 % (ref 36.0–46.0)
Hemoglobin: 14.1 g/dL (ref 12.0–15.0)
Lymphocytes Relative: 31 % (ref 12–46)
Lymphs Abs: 3 10*3/uL (ref 0.7–4.0)
MCH: 31.8 pg (ref 26.0–34.0)
MCHC: 34.6 g/dL (ref 30.0–36.0)
MCV: 91.7 fL (ref 78.0–100.0)
Monocytes Absolute: 0.7 10*3/uL (ref 0.1–1.0)
Monocytes Relative: 7 % (ref 3–12)
Neutro Abs: 6.1 10*3/uL (ref 1.7–7.7)
Neutrophils Relative %: 62 % (ref 43–77)
Platelets: 244 10*3/uL (ref 150–400)
RBC: 4.44 MIL/uL (ref 3.87–5.11)
RDW: 13 % (ref 11.5–15.5)
WBC: 9.9 10*3/uL (ref 4.0–10.5)

## 2013-12-03 LAB — BASIC METABOLIC PANEL
BUN: 11 mg/dL (ref 6–23)
CO2: 26 mEq/L (ref 19–32)
Calcium: 8.8 mg/dL (ref 8.4–10.5)
Chloride: 97 mEq/L (ref 96–112)
Creatinine, Ser: 0.6 mg/dL (ref 0.50–1.10)
GFR calc Af Amer: >90 mL/min (ref >90)
GFR calc non Af Amer: >90 mL/min (ref >90)
Glucose, Bld: 112 mg/dL — ABNORMAL HIGH (ref 70–99)
Potassium: 4.2 mEq/L (ref 3.7–5.3)
Sodium: 136 mEq/L — ABNORMAL LOW (ref 137–147)

## 2013-12-03 LAB — HEPATIC FUNCTION PANEL
ALT: 19 U/L (ref 0–35)
AST: 22 U/L (ref 0–37)
Albumin: 4 g/dL (ref 3.5–5.2)
Alkaline Phosphatase: 52 U/L (ref 39–117)
Bilirubin, Direct: <0.2 mg/dL (ref 0.0–0.3)
Total Bilirubin: 0.5 mg/dL (ref 0.3–1.2)
Total Protein: 7.1 g/dL (ref 6.0–8.3)

## 2013-12-03 LAB — LIPASE, BLOOD: Lipase: 44 U/L (ref 11–59)

## 2013-12-03 LAB — URINE MICROSCOPIC-ADD ON

## 2013-12-03 LAB — PROTIME-INR
INR: 1.25 (ref 0.00–1.49)
Prothrombin Time: 15.4 seconds — ABNORMAL HIGH (ref 11.6–15.2)

## 2013-12-03 MED ORDER — SODIUM CHLORIDE 0.9 % IV BOLUS (SEPSIS): 1000.0000 mL | Freq: Once | INTRAVENOUS | Status: DC

## 2013-12-03 MED ORDER — SODIUM CHLORIDE 0.9 % IV BOLUS (SEPSIS)
500.0000 mL | Freq: Once | INTRAVENOUS | Status: AC
  Administered 2013-12-03: 500 mL via INTRAVENOUS

## 2013-12-03 MED ORDER — SODIUM CHLORIDE 0.9 % IJ SOLN
3.0000 mL | Freq: Two times a day (BID) | INTRAMUSCULAR | Status: DC
  Administered 2013-12-03 – 2013-12-06 (×6): 3 mL via INTRAVENOUS

## 2013-12-03 MED ORDER — IOHEXOL 300 MG/ML  SOLN
100.0000 mL | Freq: Once | INTRAMUSCULAR | Status: AC | PRN
  Administered 2013-12-03: 100 mL via INTRAVENOUS

## 2013-12-03 MED ORDER — IOHEXOL 300 MG/ML  SOLN
25.0000 mL | Freq: Once | INTRAMUSCULAR | Status: AC | PRN
  Administered 2013-12-03: 25 mL via ORAL

## 2013-12-03 MED ORDER — SODIUM CHLORIDE 0.9 % IV SOLN
INTRAVENOUS | Status: DC
  Administered 2013-12-03 – 2013-12-04 (×2): via INTRAVENOUS

## 2013-12-03 MED ORDER — METRONIDAZOLE 500 MG PO TABS
500.0000 mg | ORAL_TABLET | Freq: Once | ORAL | Status: AC
  Administered 2013-12-03: 500 mg via ORAL
  Filled 2013-12-03: qty 1

## 2013-12-03 MED ORDER — WARFARIN SODIUM 2.5 MG PO TABS: 2.5000 mg | ORAL_TABLET | Freq: Once | ORAL | Status: DC

## 2013-12-03 MED ORDER — CIPROFLOXACIN HCL 500 MG PO TABS
500.0000 mg | ORAL_TABLET | Freq: Once | ORAL | Status: AC
  Administered 2013-12-03: 500 mg via ORAL
  Filled 2013-12-03: qty 1

## 2013-12-03 MED ORDER — WARFARIN - PHARMACIST DOSING INPATIENT: Freq: Every day | Status: DC

## 2013-12-03 MED ORDER — CIPROFLOXACIN IN D5W 400 MG/200ML IV SOLN
400.0000 mg | Freq: Two times a day (BID) | INTRAVENOUS | Status: DC
  Administered 2013-12-03 – 2013-12-06 (×7): 400 mg via INTRAVENOUS
  Filled 2013-12-03 (×9): qty 200

## 2013-12-03 MED ORDER — METRONIDAZOLE IN NACL 5-0.79 MG/ML-% IV SOLN
500.0000 mg | Freq: Three times a day (TID) | INTRAVENOUS | Status: DC
  Administered 2013-12-04 – 2013-12-07 (×11): 500 mg via INTRAVENOUS
  Filled 2013-12-03 (×13): qty 100

## 2013-12-03 MED ORDER — MORPHINE SULFATE 2 MG/ML IJ SOLN
1.0000 mg | INTRAMUSCULAR | Status: DC | PRN
  Filled 2013-12-03: qty 1
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
## 2013-12-03 NOTE — ED Notes (Signed)
Pt states she began having LLQ pain yesterday.  Pt was seen at [facility] in [place] and was sent here to r/o diverticulitis, abscess, ischemia.  Pt states pain has gotten progressively worse throughout the night and today.
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
## 2013-12-03 NOTE — ED Provider Notes (Signed)
Pt seen and evaluated.  TTP LLQ.  CT shows diverticulitis with small adjacent abscess. Discussed with Surgery.  Admit to Hospitalist with IV antibiotics.  Tanna Furry, MD 12/03/13 [phone]
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
## 2013-12-04 ENCOUNTER — Encounter (HOSPITAL_COMMUNITY): Payer: Self-pay | Admitting: *Deleted

## 2013-12-04 LAB — COMPREHENSIVE METABOLIC PANEL
ALT: 13 U/L (ref 0–35)
AST: 16 U/L (ref 0–37)
Albumin: 2.9 g/dL — ABNORMAL LOW (ref 3.5–5.2)
Alkaline Phosphatase: 41 U/L (ref 39–117)
BUN: 6 mg/dL (ref 6–23)
CO2: 21 mEq/L (ref 19–32)
Calcium: 7.9 mg/dL — ABNORMAL LOW (ref 8.4–10.5)
Chloride: 105 mEq/L (ref 96–112)
Creatinine, Ser: 0.5 mg/dL (ref 0.50–1.10)
GFR calc Af Amer: >90 mL/min (ref >90)
GFR calc non Af Amer: >90 mL/min (ref >90)
Glucose, Bld: 99 mg/dL (ref 70–99)
Potassium: 3.6 mEq/L — ABNORMAL LOW (ref 3.7–5.3)
Sodium: 139 mEq/L (ref 137–147)
Total Bilirubin: 0.5 mg/dL (ref 0.3–1.2)
Total Protein: 5.7 g/dL — ABNORMAL LOW (ref 6.0–8.3)

## 2013-12-04 LAB — CBC WITH DIFFERENTIAL/PLATELET
Basophils Absolute: 0 10*3/uL (ref 0.0–0.1)
Basophils Relative: 0 % (ref 0–1)
Eosinophils Absolute: 0 10*3/uL (ref 0.0–0.7)
Eosinophils Relative: 1 % (ref 0–5)
HCT: 36.5 % (ref 36.0–46.0)
Hemoglobin: 12.4 g/dL (ref 12.0–15.0)
Lymphocytes Relative: 38 % (ref 12–46)
Lymphs Abs: 2.1 10*3/uL (ref 0.7–4.0)
MCH: 31.2 pg (ref 26.0–34.0)
MCHC: 34 g/dL (ref 30.0–36.0)
MCV: 91.7 fL (ref 78.0–100.0)
Monocytes Absolute: 0.5 10*3/uL (ref 0.1–1.0)
Monocytes Relative: 10 % (ref 3–12)
Neutro Abs: 2.9 10*3/uL (ref 1.7–7.7)
Neutrophils Relative %: 52 % (ref 43–77)
Platelets: 192 10*3/uL (ref 150–400)
RBC: 3.98 MIL/uL (ref 3.87–5.11)
RDW: 13 % (ref 11.5–15.5)
WBC: 5.6 10*3/uL (ref 4.0–10.5)

## 2013-12-04 LAB — HEPARIN LEVEL (UNFRACTIONATED): Heparin Unfractionated: 0.58 IU/mL (ref 0.30–0.70)

## 2013-12-04 LAB — PROTIME-INR
INR: 1.24 (ref 0.00–1.49)
Prothrombin Time: 15.3 seconds — ABNORMAL HIGH (ref 11.6–15.2)

## 2013-12-04 MED ORDER — ASPIRIN EC 81 MG PO TBEC
81.0000 mg | DELAYED_RELEASE_TABLET | Freq: Every day | ORAL | Status: DC
  Administered 2013-12-04 – 2013-12-06 (×3): 81 mg via ORAL
  Filled 2013-12-04 (×4): qty 1

## 2013-12-04 MED ORDER — METOPROLOL SUCCINATE ER 25 MG PO TB24: 25.0000 mg | ORAL_TABLET | Freq: Every day | ORAL | Status: DC

## 2013-12-04 MED ORDER — WARFARIN SODIUM 4 MG PO TABS
4.0000 mg | ORAL_TABLET | Freq: Once | ORAL | Status: AC
  Administered 2013-12-04: 4 mg via ORAL
  Filled 2013-12-04: qty 1

## 2013-12-04 MED ORDER — SIMVASTATIN 20 MG PO TABS
20.0000 mg | ORAL_TABLET | Freq: Every day | ORAL | Status: DC
  Administered 2013-12-04 – 2013-12-06 (×3): 20 mg via ORAL
  Filled 2013-12-04 (×4): qty 1

## 2013-12-04 MED ORDER — ISOSORBIDE MONONITRATE ER 30 MG PO TB24
30.0000 mg | ORAL_TABLET | Freq: Every day | ORAL | Status: DC
  Administered 2013-12-04 – 2013-12-07 (×4): 30 mg via ORAL
  Filled 2013-12-04 (×4): qty 1

## 2013-12-04 MED ORDER — METOPROLOL SUCCINATE ER 25 MG PO TB24
25.0000 mg | ORAL_TABLET | Freq: Every day | ORAL | Status: DC
  Administered 2013-12-04 – 2013-12-07 (×4): 25 mg via ORAL
  Filled 2013-12-04 (×4): qty 1

## 2013-12-04 MED ORDER — METOPROLOL SUCCINATE ER 50 MG PO TB24
50.0000 mg | ORAL_TABLET | Freq: Every day | ORAL | Status: DC
  Administered 2013-12-04 – 2013-12-06 (×3): 50 mg via ORAL
  Filled 2013-12-04 (×4): qty 1

## 2013-12-04 MED ORDER — WARFARIN SODIUM 2.5 MG PO TABS
2.5000 mg | ORAL_TABLET | Freq: Once | ORAL | Status: AC
  Administered 2013-12-04: 2.5 mg via ORAL
  Filled 2013-12-04: qty 1

## 2013-12-04 MED ORDER — HEPARIN (PORCINE) IN NACL 100-0.45 UNIT/ML-% IJ SOLN
1100.0000 [IU]/h | INTRAMUSCULAR | Status: DC
  Administered 2013-12-04 – 2013-12-06 (×3): 1100 [IU]/h via INTRAVENOUS
  Filled 2013-12-04 (×5): qty 250

## 2013-12-04 NOTE — Progress Notes (Signed)
Patient Demographics  [name], is a 68 y.o. female, DOB - 1946-07-10, IOE:[id number]  Admit date - 12/03/2013   Admitting Physician Shanda Howells, MD  Outpatient Primary MD for the patient is [name], MD  LOS - 1   Chief Complaint  Patient presents with  . Abdominal Pain        Assessment & Plan    1. Acute Diverticulitis with possible tiny left lower quadrant diverticular abscess. She is much improved, continue bowel rest, IV fluids IV antibiotics, general surgery following. Clinically somewhat improved.    2. CAD/A fib - will resume home dose aspirin, beta blocker, Imdur and statin. No acute issues. On Coumadin at home, will place on heparin drip without bolus for now.  Lab Results  Component Value Date   INR 1.24 12/04/2013   INR 1.25 12/03/2013   INR 1.9 11/04/2013       3. Hypertension continue low-dose beta blocker, for now will hold ACE inhibitor     Code Status:  Full  Family Communication: None  Disposition Plan: Home   Procedures CT scan abdomen pelvis   Consults  CCS   Medications  Scheduled Meds: . ciprofloxacin  400 mg Intravenous Q12H  . metronidazole  500 mg Intravenous Q8H  . sodium chloride  3 mL Intravenous Q12H  . warfarin  4 mg Oral ONCE-1800  . Warfarin - Pharmacist Dosing Inpatient   Does not apply q1800   Continuous Infusions: . sodium chloride 100 mL/hr at 12/03/13 2225   PRN Meds:.morphine injection  DVT Prophylaxis  Coumadin  Lab Results  Component Value Date   PLT 192 12/04/2013    Antibiotics    Anti-infectives   Start     Dose/Rate Route Frequency Ordered Stop   12/03/13 2200  ciprofloxacin (CIPRO) IVPB 400 mg     400 mg 200 mL/hr over 60 Minutes Intravenous Every 12 hours 12/03/13 2155     12/03/13 2200   metroNIDAZOLE (FLAGYL) IVPB 500 mg     500 mg 100 mL/hr over 60 Minutes Intravenous Every 8 hours 12/03/13 2155     12/03/13 2030  ciprofloxacin (CIPRO) tablet 500 mg     500 mg Oral  Once 12/03/13 2025 12/03/13 2147   12/03/13 2030  metroNIDAZOLE (FLAGYL) tablet 500 mg     500 mg Oral  Once 12/03/13 2025 12/03/13 2146          Subjective:   [name] today has, No headache, No chest pain, mildly improved left lower quadrant abdominal pain - No Nausea, No new weakness tingling or numbness, No Cough - SOB.    Objective:   Filed Vitals:   12/03/13 1830 12/04/13 0030 12/04/13 0118 12/04/13 0449  BP: 106/56 131/70 118/65 120/61  Pulse: 67 60 61 64  Temp: 98.7 F (37.1 C)  98.1 F (36.7 C) 97.6 F (36.4 C)  TempSrc: Oral  Oral Oral  Resp:    20  Height:   5\' 3"  (1.6 m)   Weight:   67.6 kg (149 lb 0.5 oz)   SpO2: 98% 98% 98% 97%    Wt Readings from Last 3 Encounters:  12/04/13 67.6 kg (149 lb 0.5 oz)  10/21/13 67.495 kg (148 lb 12.8 oz)  07/21/13 66.407  kg (146 lb 6.4 oz)     Intake/Output Summary (Last 24 hours) at 12/04/13 1212 Last data filed at 12/04/13 1100  Gross per 24 hour  Intake    243 ml  Output    800 ml  Net   -557 ml    Exam Awake Alert, Oriented X 3, No new F.N deficits, Normal affect Junction.AT,PERRAL Supple Neck,No JVD, No cervical lymphadenopathy appriciated.  Symmetrical Chest wall movement, Good air movement bilaterally, CTAB RRR,No Gallops,Rubs or new Murmurs, No Parasternal Heave +ve B.Sounds, Abd Soft, LLQ is tender, No organomegaly appriciated, No rebound - guarding or rigidity. No Cyanosis, Clubbing or edema, No new Rash or bruise      Data Review   Micro Results No results found for this or any previous visit (from the past 240 hour(s)).  Radiology Reports Ct Abdomen Pelvis W Contrast  12/03/2013   CLINICAL DATA:  Left lower quadrant pain.  EXAM: CT ABDOMEN AND PELVIS WITH CONTRAST  TECHNIQUE: Multidetector CT imaging of the abdomen  and pelvis was performed using the standard protocol following bolus administration of intravenous contrast.  CONTRAST:  119mL OMNIPAQUE IOHEXOL 300 MG/ML  SOLN  COMPARISON:  CT scans dated 07/23/2013 and 07/13/2009  FINDINGS: The patient has focal diverticulitis of the distal descending/sigmoid colon junction in the left lower quadrant. There is what is either a distended inflamed diverticulum or a small diverticular abscess containing fluid and air on image number 51 of series 2. There are numerous diverticula in the colon primarily in the descending and sigmoid regions.  Liver, spleen, pancreas, adrenal glands, and kidneys are normal except for 2 tiny cysts in the liver and a single tiny cyst in the lower pole of the left kidney. There is a 10 mm cyst in the anterior aspect of the left lobe of the liver and a 8 mm cyst in the posterior aspect of the right lobe of the liver and a 6 mm cyst in the lower pole of the left kidney.  There is extensive calcification in the abdominal aorta and iliac arteries. Uterus and ovaries are normal. Bladder is normal. No significant osseous abnormality.  IMPRESSION: Acute diverticulitis in the left lower quadrant with either a single markedly inflamed dilated diverticulum or a tiny diverticular abscess.   Electronically Signed   By: Rozetta Nunnery M.D.   On: 12/03/2013 20:20    CBC  Recent Labs Lab 12/03/13 1407 12/04/13 0550  WBC 9.9 5.6  HGB 14.1 12.4  HCT 40.7 36.5  PLT 244 192  MCV 91.7 91.7  MCH 31.8 31.2  MCHC 34.6 34.0  RDW 13.0 13.0  LYMPHSABS 3.0 2.1  MONOABS 0.7 0.5  EOSABS 0.0 0.0  BASOSABS 0.0 0.0    Chemistries   Recent Labs Lab 12/03/13 1407 12/04/13 0550  NA 136* 139  K 4.2 3.6*  CL 97 105  CO2 26 21  GLUCOSE 112* 99  BUN 11 6  CREATININE 0.60 0.50  CALCIUM 8.8 7.9*  AST 22 16  ALT 19 13  ALKPHOS 52 41  BILITOT 0.5 0.5    ------------------------------------------------------------------------------------------------------------------ estimated creatinine clearance is 61.3 ml/min (by C-G formula based on Cr of 0.5). ------------------------------------------------------------------------------------------------------------------ No results found for this basename: HGBA1C,  in the last 72 hours ------------------------------------------------------------------------------------------------------------------ No results found for this basename: CHOL, HDL, LDLCALC, TRIG, CHOLHDL, LDLDIRECT,  in the last 72 hours ------------------------------------------------------------------------------------------------------------------ No results found for this basename: TSH, T4TOTAL, FREET3, T3FREE, THYROIDAB,  in the last 72 hours ------------------------------------------------------------------------------------------------------------------ No results found for  this basename: VITAMINB12, FOLATE, FERRITIN, TIBC, IRON, RETICCTPCT,  in the last 72 hours  Coagulation profile  Recent Labs Lab 12/03/13 2223 12/04/13 0550  INR 1.25 1.24    No results found for this basename: DDIMER,  in the last 72 hours  Cardiac Enzymes No results found for this basename: CK, CKMB, TROPONINI, MYOGLOBIN,  in the last 168 hours ------------------------------------------------------------------------------------------------------------------ No components found with this basename: POCBNP,      Time Spent in minutes   35   Bardia Wangerin K M.D on 12/04/2013 at 12:12 PM  Between 7am to 7pm - Pager - [phone]  After 7pm go to [email or web address] - password [email or web address]  And look for the night coverage person covering for me after hours  [facility] Office  [phone]

## 2013-12-04 NOTE — Progress Notes (Signed)
Subjective: She reports her LLQ abdominal pain is less  Objective: Vital signs in last 24 hours: Temp:  [97.6 F (36.4 C)-98.7 F (37.1 C)] 97.6 F (36.4 C) (01/22 0449) Pulse Rate:  [60-74] 64 (01/22 0449) Resp:  [18-20] 20 (01/22 0449) BP: (106-171)/(56-73) 120/61 mmHg (01/22 0449) SpO2:  [94 %-99 %] 97 % (01/22 0449) Weight:  [149 lb 0.5 oz (67.6 kg)] 149 lb 0.5 oz (67.6 kg) (01/22 0118) Last BM Date: 12/03/13  Intake/Output from previous day: 01/21 0701 - 01/22 0700 In: 3 [I.V.:3] Out: 800 [Urine:800] Intake/Output this shift:    Abdomen soft with mild LLQ tenderness and minimal guarding  Lab Results:   Recent Labs  12/03/13 1407 12/04/13 0550  WBC 9.9 5.6  HGB 14.1 12.4  HCT 40.7 36.5  PLT 244 192   BMET  Recent Labs  12/03/13 1407 12/04/13 0550  NA 136* 139  K 4.2 3.6*  CL 97 105  CO2 26 21  GLUCOSE 112* 99  BUN 11 6  CREATININE 0.60 0.50  CALCIUM 8.8 7.9*   PT/INR  Recent Labs  12/03/13 2223 12/04/13 0550  LABPROT 15.4* 15.3*  INR 1.25 1.24   ABG No results found for this basename: PHART, PCO2, PO2, HCO3,  in the last 72 hours  Studies/Results: Ct Abdomen Pelvis W Contrast  12/03/2013   CLINICAL DATA:  Left lower quadrant pain.  EXAM: CT ABDOMEN AND PELVIS WITH CONTRAST  TECHNIQUE: Multidetector CT imaging of the abdomen and pelvis was performed using the standard protocol following bolus administration of intravenous contrast.  CONTRAST:  174mL OMNIPAQUE IOHEXOL 300 MG/ML  SOLN  COMPARISON:  CT scans dated 07/23/2013 and 07/13/2009  FINDINGS: The patient has focal diverticulitis of the distal descending/sigmoid colon junction in the left lower quadrant. There is what is either a distended inflamed diverticulum or a small diverticular abscess containing fluid and air on image number 51 of series 2. There are numerous diverticula in the colon primarily in the descending and sigmoid regions.  Liver, spleen, pancreas, adrenal glands, and  kidneys are normal except for 2 tiny cysts in the liver and a single tiny cyst in the lower pole of the left kidney. There is a 10 mm cyst in the anterior aspect of the left lobe of the liver and a 8 mm cyst in the posterior aspect of the right lobe of the liver and a 6 mm cyst in the lower pole of the left kidney.  There is extensive calcification in the abdominal aorta and iliac arteries. Uterus and ovaries are normal. Bladder is normal. No significant osseous abnormality.  IMPRESSION: Acute diverticulitis in the left lower quadrant with either a single markedly inflamed dilated diverticulum or a tiny diverticular abscess.   Electronically Signed   By: Rozetta Nunnery M.D.   On: 12/03/2013 20:20    Anti-infectives: Anti-infectives   Start     Dose/Rate Route Frequency Ordered Stop   12/03/13 2200  ciprofloxacin (CIPRO) IVPB 400 mg     400 mg 200 mL/hr over 60 Minutes Intravenous Every 12 hours 12/03/13 2155     12/03/13 2200  metroNIDAZOLE (FLAGYL) IVPB 500 mg     500 mg 100 mL/hr over 60 Minutes Intravenous Every 8 hours 12/03/13 2155     12/03/13 2030  ciprofloxacin (CIPRO) tablet 500 mg     500 mg Oral  Once 12/03/13 2025 12/03/13 2147   12/03/13 2030  metroNIDAZOLE (FLAGYL) tablet 500 mg     500 mg Oral  Once 12/03/13 2025 12/03/13 2146      Assessment/Plan: s/p * No surgery found *  Diverticulitis  Improving Will allow liquids. From a surgical standpoint, I think coumadin needs to be held for another day, but I'm ok with starting IV heparin if needed.  LOS: 1 day    Desire Fulp A 12/04/2013

## 2013-12-04 NOTE — Progress Notes (Signed)
ANTICOAGULATION CONSULT NOTE - Follow Up Consult  Pharmacy Consult for Heparin Indication: atrial fibrillation  Allergies  Allergen Reactions  . Crestor [Rosuvastatin]   . Diltiazem     Weakness on oral Dilt  . Lipitor [Atorvastatin]   . Penicillins Other (See Comments)    Blurred vision  . Phenergan [Promethazine Hcl] Other (See Comments)    Nervous Leg / Restless Leg Syndrome  . Promethazine Other (See Comments)    Uncontrolled leg shaking  . Sulfa Antibiotics Photosensitivity and Rash    Burning Rash    Patient Measurements: Height: 5\' 3"  (160 cm) Weight: 149 lb 0.5 oz (67.6 kg) IBW/kg (Calculated) : 52.4 Heparin Dosing Weight: 66.1 kg  Vital Signs: Temp: 98.4 F (36.9 C) (01/22 2035) Temp src: Oral (01/22 2035) BP: 133/69 mmHg (01/22 2035) Pulse Rate: 64 (01/22 2035)  Labs:  Recent Labs  12/03/13 1407 12/03/13 2223 12/04/13 0550 12/04/13 2145  HGB 14.1  --  12.4  --   HCT 40.7  --  36.5  --   PLT 244  --  192  --   LABPROT  --  15.4* 15.3*  --   INR  --  1.25 1.24  --   HEPARINUNFRC  --   --   --  0.58  CREATININE 0.60  --  0.50  --     Estimated Creatinine Clearance: 61.3 ml/min (by C-G formula based on Cr of 0.5).   Medications:  Infusions:  . sodium chloride 100 mL/hr at 12/04/13 1423  . heparin 1,100 Units/hr (12/04/13 1424)    Assessment: 68 y/o female on heparin to bridge Coumadin for Afib. Heparin level is therapeutic at 0.58 on 1100 units/hr. No bleeding noted.  Goal of Therapy:  Heparin level 0.3-0.7 units/ml Monitor platelets by anticoagulation protocol: Yes   Plan:  -Continue heparin drip at 1100 units/hr -Daily heparin level and CBC -Monitor for s/sx of bleeding  [facility], [place].D., BCPS Clinical Pharmacist Pager: [phone] 12/04/2013 10:53 PM

## 2013-12-04 NOTE — Progress Notes (Signed)
UR completed. Patient changed to inpatient r/t requiring IVF @ 100cc/hr and IV flagyl

## 2013-12-04 NOTE — Progress Notes (Addendum)
[place] for Coumadin / Heparin Indication: atrial fibrillation  Allergies  Allergen Reactions  . Crestor [Rosuvastatin]   . Diltiazem     Weakness on oral Dilt  . Lipitor [Atorvastatin]   . Penicillins Other (See Comments)    Blurred vision  . Phenergan [Promethazine Hcl] Other (See Comments)    Nervous Leg / Restless Leg Syndrome  . Promethazine Other (See Comments)    Uncontrolled leg shaking  . Sulfa Antibiotics Photosensitivity and Rash    Burning Rash    Labs:  Recent Labs  12/03/13 1407 12/03/13 2223 12/04/13 0550  HGB 14.1  --  12.4  HCT 40.7  --  36.5  PLT 244  --  192  LABPROT  --  15.4* 15.3*  INR  --  1.25 1.24  CREATININE 0.60  --  0.50    Assessment: 68yo female c/o LLQ pain starting yesterday, found w/ diverticulitis and small abscesses, plan for bowel rest for now w/ surgical intervention if no improvement, to continue Coumadin for Afib  INR at baseline PTA? - Dose PTA = 2.5 mg daily except 1.25 mg on Monday Continues on Cipro and Flagyl - both interact with Coumadin  Added heparin today as a bridge  Goal of Therapy:  INR 2-3   Plan:  1) Coumadin 4 mg po x 1  2) Heparin drip at 1100 units / hr 3) Labs as needed  Thank you. Anette Guarneri, PharmD [phone]  12/04/2013,9:13 AM

## 2013-12-05 ENCOUNTER — Ambulatory Visit: Payer: Medicare Other | Admitting: Pharmacist Clinician (PhC)/ Clinical Pharmacy Specialist

## 2013-12-05 LAB — CBC WITH DIFFERENTIAL/PLATELET
Basophils Absolute: 0 10*3/uL (ref 0.0–0.1)
Basophils Relative: 1 % (ref 0–1)
Eosinophils Absolute: 0.1 10*3/uL (ref 0.0–0.7)
Eosinophils Relative: 1 % (ref 0–5)
HCT: 35.7 % — ABNORMAL LOW (ref 36.0–46.0)
Hemoglobin: 12 g/dL (ref 12.0–15.0)
Lymphocytes Relative: 44 % (ref 12–46)
Lymphs Abs: 2.5 10*3/uL (ref 0.7–4.0)
MCH: 30.8 pg (ref 26.0–34.0)
MCHC: 33.6 g/dL (ref 30.0–36.0)
MCV: 91.8 fL (ref 78.0–100.0)
Monocytes Absolute: 0.4 10*3/uL (ref 0.1–1.0)
Monocytes Relative: 7 % (ref 3–12)
Neutro Abs: 2.6 10*3/uL (ref 1.7–7.7)
Neutrophils Relative %: 47 % (ref 43–77)
Platelets: 185 10*3/uL (ref 150–400)
RBC: 3.89 MIL/uL (ref 3.87–5.11)
RDW: 13.1 % (ref 11.5–15.5)
WBC: 5.6 10*3/uL (ref 4.0–10.5)

## 2013-12-05 LAB — BASIC METABOLIC PANEL
BUN: 5 mg/dL — ABNORMAL LOW (ref 6–23)
CO2: 23 mEq/L (ref 19–32)
Calcium: 7.7 mg/dL — ABNORMAL LOW (ref 8.4–10.5)
Chloride: 108 mEq/L (ref 96–112)
Creatinine, Ser: 0.53 mg/dL (ref 0.50–1.10)
GFR calc Af Amer: >90 mL/min (ref >90)
GFR calc non Af Amer: >90 mL/min (ref >90)
Glucose, Bld: 102 mg/dL — ABNORMAL HIGH (ref 70–99)
Potassium: 3.5 mEq/L — ABNORMAL LOW (ref 3.7–5.3)
Sodium: 143 mEq/L (ref 137–147)

## 2013-12-05 LAB — PROTIME-INR
INR: 1.3 (ref 0.00–1.49)
Prothrombin Time: 15.9 seconds — ABNORMAL HIGH (ref 11.6–15.2)

## 2013-12-05 LAB — HEPARIN LEVEL (UNFRACTIONATED): Heparin Unfractionated: 0.61 IU/mL (ref 0.30–0.70)

## 2013-12-05 MED ORDER — POTASSIUM CHLORIDE IN NACL 20-0.9 MEQ/L-% IV SOLN
INTRAVENOUS | Status: DC
  Administered 2013-12-05: 13:00:00 via INTRAVENOUS
  Filled 2013-12-05 (×3): qty 1000

## 2013-12-05 MED ORDER — POTASSIUM CHLORIDE CRYS ER 20 MEQ PO TBCR
40.0000 meq | EXTENDED_RELEASE_TABLET | Freq: Once | ORAL | Status: AC
  Administered 2013-12-05: 40 meq via ORAL
  Filled 2013-12-05: qty 2

## 2013-12-05 MED ORDER — DIPHENHYDRAMINE HCL 25 MG PO CAPS
25.0000 mg | ORAL_CAPSULE | Freq: Every evening | ORAL | Status: DC | PRN
  Administered 2013-12-05: 25 mg via ORAL
  Filled 2013-12-05: qty 1

## 2013-12-05 MED ORDER — ZOLPIDEM TARTRATE 5 MG PO TABS: 5.0000 mg | ORAL_TABLET | Freq: Every evening | ORAL | Status: DC | PRN

## 2013-12-05 MED ORDER — DIPHENHYDRAMINE HCL 25 MG PO CAPS: 25.0000 mg | ORAL_CAPSULE | Freq: Every evening | ORAL | Status: DC | PRN

## 2013-12-05 MED ORDER — ZOLPIDEM TARTRATE 5 MG PO TABS
5.0000 mg | ORAL_TABLET | Freq: Every evening | ORAL | Status: DC | PRN
  Filled 2013-12-05: qty 1

## 2013-12-05 MED ORDER — WARFARIN SODIUM 4 MG PO TABS
4.0000 mg | ORAL_TABLET | Freq: Once | ORAL | Status: AC
  Administered 2013-12-05: 4 mg via ORAL
  Filled 2013-12-05: qty 1

## 2013-12-05 NOTE — Progress Notes (Signed)
ANTICOAGULATION CONSULT NOTE - Follow Up Consult  Pharmacy Consult for Heparin and Coumadin Indication: atrial fibrillation  Allergies  Allergen Reactions  . Crestor [Rosuvastatin]   . Diltiazem     Weakness on oral Dilt  . Lipitor [Atorvastatin]   . Penicillins Other (See Comments)    Blurred vision  . Phenergan [Promethazine Hcl] Other (See Comments)    Nervous Leg / Restless Leg Syndrome  . Promethazine Other (See Comments)    Uncontrolled leg shaking  . Sulfa Antibiotics Photosensitivity and Rash    Burning Rash    Patient Measurements: Height: 5\' 3"  (160 cm) Weight: 146 lb (66.225 kg) IBW/kg (Calculated) : 52.4 Heparin Dosing Weight:   Vital Signs: Temp: 98 F (36.7 C) (01/23 0447) Temp src: Oral (01/23 0447) BP: 109/56 mmHg (01/23 0447) Pulse Rate: 52 (01/23 0447)  Labs:  Recent Labs  12/03/13 1407 12/03/13 2223 12/04/13 0550 12/04/13 2145 12/05/13 0334  HGB 14.1  --  12.4  --  12.0  HCT 40.7  --  36.5  --  35.7*  PLT 244  --  192  --  185  LABPROT  --  15.4* 15.3*  --  15.9*  INR  --  1.25 1.24  --  1.30  HEPARINUNFRC  --   --   --  0.58 0.61  CREATININE 0.60  --  0.50  --  0.53    Estimated Creatinine Clearance: 60.7 ml/min (by C-G formula based on Cr of 0.53).   Medications:  Scheduled:  . aspirin EC  81 mg Oral QHS  . ciprofloxacin  400 mg Intravenous Q12H  . isosorbide mononitrate  30 mg Oral Daily  . metoprolol succinate  25 mg Oral Daily  . metoprolol succinate  50 mg Oral QHS  . metronidazole  500 mg Intravenous Q8H  . simvastatin  20 mg Oral QPC supper  . sodium chloride  3 mL Intravenous Q12H  . Warfarin - Pharmacist Dosing Inpatient   Does not apply q1800    Assessment: 68yo female with AFib, on heparin bridge with Coumadin due to sub-therapeutic INR on admission.  INR 1.3, Heparin level 0.61 & stable, and pltc 185 this AM.  No bleeding problems noted.  Goal of Therapy:  INR 2-3 Heparin level 0.3-0.7 units/ml Monitor platelets  by anticoagulation protocol: Yes   Plan:  1-  Continue Heparin 1100 units/hr 2-  Coumadin 4mg  today 3-  Continue daily INR, HL, and CBC  Gracy Bruins, [facility]

## 2013-12-05 NOTE — Progress Notes (Signed)
Subjective: Abdominal pain slightly better.  Did get bloated with clears Passing flatus  Objective: Vital signs in last 24 hours: Temp:  [97.5 F (36.4 C)-98.4 F (36.9 C)] 98 F (36.7 C) (01/23 0447) Pulse Rate:  [52-64] 52 (01/23 0447) Resp:  [18-20] 20 (01/23 0447) BP: (109-151)/(56-69) 109/56 mmHg (01/23 0447) SpO2:  [97 %-100 %] 97 % (01/23 0447) Weight:  [146 lb (66.225 kg)] 146 lb (66.225 kg) (01/23 0447) Last BM Date: 12/03/13  Intake/Output from previous day: 01/22 0701 - 01/23 0700 In: 5233.2 [P.O.:1560; I.V.:2673.2; IV Piggyback:1000] Out: 3050 [Urine:3050] Intake/Output this shift: Total I/O In: 878.8 [I.V.:878.8] Out: -   Abdomen is softer but still with tenderness and guarding in the LLQ, slightly better than yesterday  Lab Results:   Recent Labs  12/04/13 0550 12/05/13 0334  WBC 5.6 5.6  HGB 12.4 12.0  HCT 36.5 35.7*  PLT 192 185   BMET  Recent Labs  12/04/13 0550 12/05/13 0334  NA 139 143  K 3.6* 3.5*  CL 105 108  CO2 21 23  GLUCOSE 99 102*  BUN 6 5*  CREATININE 0.50 0.53  CALCIUM 7.9* 7.7*   PT/INR  Recent Labs  12/04/13 0550 12/05/13 0334  LABPROT 15.3* 15.9*  INR 1.24 1.30   ABG No results found for this basename: PHART, PCO2, PO2, HCO3,  in the last 72 hours  Studies/Results: Ct Abdomen Pelvis W Contrast  12/03/2013   CLINICAL DATA:  Left lower quadrant pain.  EXAM: CT ABDOMEN AND PELVIS WITH CONTRAST  TECHNIQUE: Multidetector CT imaging of the abdomen and pelvis was performed using the standard protocol following bolus administration of intravenous contrast.  CONTRAST:  11mL OMNIPAQUE IOHEXOL 300 MG/ML  SOLN  COMPARISON:  CT scans dated 07/23/2013 and 07/13/2009  FINDINGS: The patient has focal diverticulitis of the distal descending/sigmoid colon junction in the left lower quadrant. There is what is either a distended inflamed diverticulum or a small diverticular abscess containing fluid and air on image number 51 of  series 2. There are numerous diverticula in the colon primarily in the descending and sigmoid regions.  Liver, spleen, pancreas, adrenal glands, and kidneys are normal except for 2 tiny cysts in the liver and a single tiny cyst in the lower pole of the left kidney. There is a 10 mm cyst in the anterior aspect of the left lobe of the liver and a 8 mm cyst in the posterior aspect of the right lobe of the liver and a 6 mm cyst in the lower pole of the left kidney.  There is extensive calcification in the abdominal aorta and iliac arteries. Uterus and ovaries are normal. Bladder is normal. No significant osseous abnormality.  IMPRESSION: Acute diverticulitis in the left lower quadrant with either a single markedly inflamed dilated diverticulum or a tiny diverticular abscess.   Electronically Signed   By: Rozetta Nunnery M.D.   On: 12/03/2013 20:20    Anti-infectives: Anti-infectives   Start     Dose/Rate Route Frequency Ordered Stop   12/03/13 2200  ciprofloxacin (CIPRO) IVPB 400 mg     400 mg 200 mL/hr over 60 Minutes Intravenous Every 12 hours 12/03/13 2155     12/03/13 2200  metroNIDAZOLE (FLAGYL) IVPB 500 mg     500 mg 100 mL/hr over 60 Minutes Intravenous Every 8 hours 12/03/13 2155     12/03/13 2030  ciprofloxacin (CIPRO) tablet 500 mg     500 mg Oral  Once 12/03/13 2025 12/03/13 2147  12/03/13 2030  metroNIDAZOLE (FLAGYL) tablet 500 mg     500 mg Oral  Once 12/03/13 2025 12/03/13 2146      Assessment/Plan: s/p * No surgery found *  Diverticulitis  Will continue current care  LOS: 2 days    Patricio Popwell A 12/05/2013

## 2013-12-05 NOTE — Progress Notes (Signed)
Patient Demographics  [name], is a 68 y.o. female, DOB - 07-Feb-1946, AUQ:[id number]  Admit date - 12/03/2013   Admitting Physician Bridget Howells, MD  Outpatient Primary MD for the patient is Bridget Pink, MD  LOS - 2   Chief Complaint  Patient presents with  . Abdominal Pain        Assessment & Plan    1. Acute Diverticulitis with possible tiny left lower quadrant diverticular abscess. She is much improved, continue bowel rest, IV fluids IV antibiotics, general surgery following. Clinically somewhat improved.    2. CAD/A fib - will resume home dose aspirin, beta blocker, Imdur and statin. No acute issues. On Coumadin at home, will place on heparin drip without bolus for now.  Lab Results  Component Value Date   INR 1.30 12/05/2013   INR 1.24 12/04/2013   INR 1.25 12/03/2013       3. Hypertension continue low-dose beta blocker, for now will hold ACE inhibitor     Code Status:  Full  Family Communication: None  Disposition Plan: Home   Procedures CT scan abdomen pelvis   Consults  [facility]   Medications  Scheduled Meds: . aspirin EC  81 mg Oral QHS  . ciprofloxacin  400 mg Intravenous Q12H  . isosorbide mononitrate  30 mg Oral Daily  . metoprolol succinate  25 mg Oral Daily  . metoprolol succinate  50 mg Oral QHS  . metronidazole  500 mg Intravenous Q8H  . simvastatin  20 mg Oral QPC supper  . sodium chloride  3 mL Intravenous Q12H  . warfarin  4 mg Oral ONCE-1800  . Warfarin - Pharmacist Dosing Inpatient   Does not apply q1800   Continuous Infusions: . 0.9 % NaCl with KCl 20 mEq / L    . heparin 1,100 Units/hr (12/04/13 1424)   PRN Meds:.morphine injection, zolpidem  DVT Prophylaxis  Coumadin  Lab Results  Component Value Date   PLT 185 12/05/2013     Antibiotics    Anti-infectives   Start     Dose/Rate Route Frequency Ordered Stop   12/03/13 2200  ciprofloxacin (CIPRO) IVPB 400 mg     400 mg 200 mL/hr over 60 Minutes Intravenous Every 12 hours 12/03/13 2155     12/03/13 2200  metroNIDAZOLE (FLAGYL) IVPB 500 mg     500 mg 100 mL/hr over 60 Minutes Intravenous Every 8 hours 12/03/13 2155     12/03/13 2030  ciprofloxacin (CIPRO) tablet 500 mg     500 mg Oral  Once 12/03/13 2025 12/03/13 2147   12/03/13 2030  metroNIDAZOLE (FLAGYL) tablet 500 mg     500 mg Oral  Once 12/03/13 2025 12/03/13 2146          Subjective:   [name] today has, No headache, No chest pain, mildly improved left lower quadrant abdominal pain - No Nausea, No new weakness tingling or numbness, No Cough - SOB.    Objective:   Filed Vitals:   12/04/13 1345 12/04/13 2035 12/05/13 0447 12/05/13 1030  BP: 151/66 133/69 109/56 120/80  Pulse: 61 64 52 60  Temp: 97.5 F (36.4 C) 98.4 F (36.9 C) 98 F (36.7 C)   TempSrc: Oral Oral Oral   Resp: 20  18 20   Height:      Weight:   66.225 kg (146 lb)   SpO2: 100% 99% 97%     Wt Readings from Last 3 Encounters:  12/05/13 66.225 kg (146 lb)  10/21/13 67.495 kg (148 lb 12.8 oz)  07/21/13 66.407 kg (146 lb 6.4 oz)     Intake/Output Summary (Last 24 hours) at 12/05/13 1209 Last data filed at 12/05/13 0900  Gross per 24 hour  Intake 6351.93 ml  Output   3450 ml  Net 2901.93 ml    Exam Awake Alert, Oriented X 3, No new F.N deficits, Normal affect [place].AT,PERRAL Supple Neck,No JVD, No cervical lymphadenopathy appriciated.  Symmetrical Chest wall movement, Good air movement bilaterally, CTAB RRR,No Gallops,Rubs or new Murmurs, No Parasternal Heave +ve B.Sounds, Abd Soft, LLQ is tender, No organomegaly appriciated, No rebound - guarding or rigidity. No Cyanosis, Clubbing or edema, No new Rash or bruise      Data Review   Micro Results No results found for this or any previous visit (from the  past 240 hour(s)).  Radiology Reports Ct Abdomen Pelvis W Contrast  12/03/2013   CLINICAL DATA:  Left lower quadrant pain.  EXAM: CT ABDOMEN AND PELVIS WITH CONTRAST  TECHNIQUE: Multidetector CT imaging of the abdomen and pelvis was performed using the standard protocol following bolus administration of intravenous contrast.  CONTRAST:  139mL OMNIPAQUE IOHEXOL 300 MG/ML  SOLN  COMPARISON:  CT scans dated 07/23/2013 and 07/13/2009  FINDINGS: The patient has focal diverticulitis of the distal descending/sigmoid colon junction in the left lower quadrant. There is what is either a distended inflamed diverticulum or a small diverticular abscess containing fluid and air on image number 51 of series 2. There are numerous diverticula in the colon primarily in the descending and sigmoid regions.  Liver, spleen, pancreas, adrenal glands, and kidneys are normal except for 2 tiny cysts in the liver and a single tiny cyst in the lower pole of the left kidney. There is a 10 mm cyst in the anterior aspect of the left lobe of the liver and a 8 mm cyst in the posterior aspect of the right lobe of the liver and a 6 mm cyst in the lower pole of the left kidney.  There is extensive calcification in the abdominal aorta and iliac arteries. Uterus and ovaries are normal. Bladder is normal. No significant osseous abnormality.  IMPRESSION: Acute diverticulitis in the left lower quadrant with either a single markedly inflamed dilated diverticulum or a tiny diverticular abscess.   Electronically Signed   By: Rozetta Nunnery M.D.   On: 12/03/2013 20:20    CBC  Recent Labs Lab 12/03/13 1407 12/04/13 0550 12/05/13 0334  WBC 9.9 5.6 5.6  HGB 14.1 12.4 12.0  HCT 40.7 36.5 35.7*  PLT 244 192 185  MCV 91.7 91.7 91.8  MCH 31.8 31.2 30.8  MCHC 34.6 34.0 33.6  RDW 13.0 13.0 13.1  LYMPHSABS 3.0 2.1 2.5  MONOABS 0.7 0.5 0.4  EOSABS 0.0 0.0 0.1  BASOSABS 0.0 0.0 0.0    Chemistries   Recent Labs Lab 12/03/13 1407 12/04/13 0550  12/05/13 0334  NA 136* 139 143  K 4.2 3.6* 3.5*  CL 97 105 108  CO2 26 21 23   GLUCOSE 112* 99 102*  BUN 11 6 5*  CREATININE 0.60 0.50 0.53  CALCIUM 8.8 7.9* 7.7*  AST 22 16  --   ALT 19 13  --   ALKPHOS 52 41  --  BILITOT 0.5 0.5  --    ------------------------------------------------------------------------------------------------------------------ estimated creatinine clearance is 60.7 ml/min (by C-G formula based on Cr of 0.53). ------------------------------------------------------------------------------------------------------------------ No results found for this basename: HGBA1C,  in the last 72 hours ------------------------------------------------------------------------------------------------------------------ No results found for this basename: CHOL, HDL, LDLCALC, TRIG, CHOLHDL, LDLDIRECT,  in the last 72 hours ------------------------------------------------------------------------------------------------------------------ No results found for this basename: TSH, T4TOTAL, FREET3, T3FREE, THYROIDAB,  in the last 72 hours ------------------------------------------------------------------------------------------------------------------ No results found for this basename: VITAMINB12, FOLATE, FERRITIN, TIBC, IRON, RETICCTPCT,  in the last 72 hours  Coagulation profile  Recent Labs Lab 12/03/13 2223 12/04/13 0550 12/05/13 0334  INR 1.25 1.24 1.30    No results found for this basename: DDIMER,  in the last 72 hours  Cardiac Enzymes No results found for this basename: CK, CKMB, TROPONINI, MYOGLOBIN,  in the last 168 hours ------------------------------------------------------------------------------------------------------------------ No components found with this basename: POCBNP,      Time Spent in minutes   35   Kyren Vaux K M.D on 12/05/2013 at 12:09 PM  Between 7am to 7pm - Pager - [phone]  After 7pm go to [email or web address] - password [email or web address]  And  look for the night coverage person covering for me after hours  [facility] Office  [phone]

## 2013-12-05 NOTE — Care Management Note (Signed)
    Page 1 of 1   12/05/2013     3:45:37 PM   CARE MANAGEMENT NOTE 12/05/2013  Patient:  [name], [name]   Account Number:  [id number]  Date Initiated:  12/05/2013  Documentation initiated by:  Kaileen Bronkema  Subjective/Objective Assessment:   PT ADM ON 12/05/13 WITH DIVERTICULITIS, AFIB.  PTA, PT INDEPENDENT OF ADLS.     Action/Plan:   CM REFERRAL TO CHECK X[facility] BENEFITS.  COPAY IS $20, PER PAYOR.  DISCUSSED WITH PT:  SHE PREFERS TO STAY ON COUMADIN, AS XARELTO WILL EAT THROUGH HER $4000/YR RX CAP QUICKLY,(SHE IS ON OTHER EXPENSIVE MED), AND PUT HER IN THE DONUT HOLE.   Anticipated DC Date:  12/06/2013   Anticipated DC Plan:  [place]  CM consult  Medication Assistance      Choice offered to / List presented to:             Status of service:  Completed, signed off Medicare Important Message given?   (If response is "NO", the following Medicare IM given date fields will be blank) Date Medicare IM given:   Date Additional Medicare IM given:    Discharge Disposition:  HOME/SELF CARE  Per UR Regulation:  Reviewed for med. necessity/level of care/duration of stay  If discussed at [place] of Stay Meetings, dates discussed:    Comments:

## 2013-12-06 ENCOUNTER — Inpatient Hospital Stay (HOSPITAL_COMMUNITY): Payer: Medicare Other

## 2013-12-06 LAB — BASIC METABOLIC PANEL
BUN: 9 mg/dL (ref 6–23)
CO2: 21 mEq/L (ref 19–32)
Calcium: 8 mg/dL — ABNORMAL LOW (ref 8.4–10.5)
Chloride: 112 mEq/L (ref 96–112)
Creatinine, Ser: 0.57 mg/dL (ref 0.50–1.10)
GFR calc Af Amer: >90 mL/min (ref >90)
GFR calc non Af Amer: >90 mL/min (ref >90)
Glucose, Bld: 105 mg/dL — ABNORMAL HIGH (ref 70–99)
Potassium: 4.7 mEq/L (ref 3.7–5.3)
Sodium: 146 mEq/L (ref 137–147)

## 2013-12-06 LAB — CBC WITH DIFFERENTIAL/PLATELET
Basophils Absolute: 0 10*3/uL (ref 0.0–0.1)
Basophils Relative: 0 % (ref 0–1)
Eosinophils Absolute: 0.1 10*3/uL (ref 0.0–0.7)
Eosinophils Relative: 2 % (ref 0–5)
HCT: 34.2 % — ABNORMAL LOW (ref 36.0–46.0)
Hemoglobin: 11.6 g/dL — ABNORMAL LOW (ref 12.0–15.0)
Lymphocytes Relative: 46 % (ref 12–46)
Lymphs Abs: 2.7 10*3/uL (ref 0.7–4.0)
MCH: 31.1 pg (ref 26.0–34.0)
MCHC: 33.9 g/dL (ref 30.0–36.0)
MCV: 91.7 fL (ref 78.0–100.0)
Monocytes Absolute: 0.5 10*3/uL (ref 0.1–1.0)
Monocytes Relative: 8 % (ref 3–12)
Neutro Abs: 2.6 10*3/uL (ref 1.7–7.7)
Neutrophils Relative %: 44 % (ref 43–77)
Platelets: 150 10*3/uL (ref 150–400)
RBC: 3.73 MIL/uL — ABNORMAL LOW (ref 3.87–5.11)
RDW: 13 % (ref 11.5–15.5)
WBC: 5.9 10*3/uL (ref 4.0–10.5)

## 2013-12-06 LAB — PROTIME-INR
INR: 1.67 — ABNORMAL HIGH (ref 0.00–1.49)
Prothrombin Time: 19.2 seconds — ABNORMAL HIGH (ref 11.6–15.2)

## 2013-12-06 LAB — HEPARIN LEVEL (UNFRACTIONATED): Heparin Unfractionated: 0.67 IU/mL (ref 0.30–0.70)

## 2013-12-06 MED ORDER — SALINE SPRAY 0.65 % NA SOLN
1.0000 | NASAL | Status: DC | PRN
  Filled 2013-12-06: qty 44

## 2013-12-06 MED ORDER — WARFARIN SODIUM 4 MG PO TABS
4.0000 mg | ORAL_TABLET | Freq: Once | ORAL | Status: AC
  Administered 2013-12-06: 4 mg via ORAL
  Filled 2013-12-06: qty 1

## 2013-12-06 MED ORDER — FUROSEMIDE 10 MG/ML IJ SOLN
20.0000 mg | Freq: Once | INTRAMUSCULAR | Status: AC
  Administered 2013-12-06: 20 mg via INTRAVENOUS

## 2013-12-06 MED ORDER — DOCUSATE SODIUM 100 MG PO CAPS
200.0000 mg | ORAL_CAPSULE | Freq: Two times a day (BID) | ORAL | Status: AC
  Administered 2013-12-06 (×2): 200 mg via ORAL
  Filled 2013-12-06 (×2): qty 2

## 2013-12-06 MED ORDER — ALBUTEROL SULFATE (2.5 MG/3ML) 0.083% IN NEBU
2.5000 mg | INHALATION_SOLUTION | Freq: Four times a day (QID) | RESPIRATORY_TRACT | Status: DC | PRN
  Filled 2013-12-06: qty 3

## 2013-12-06 NOTE — Progress Notes (Signed)
ANTICOAGULATION CONSULT NOTE - Follow Up Consult  Pharmacy Consult for Heparin and Coumadin Indication: atrial fibrillation  Allergies  Allergen Reactions  . Crestor [Rosuvastatin]   . Diltiazem     Weakness on oral Dilt  . Lipitor [Atorvastatin]   . Penicillins Other (See Comments)    Blurred vision  . Phenergan [Promethazine Hcl] Other (See Comments)    Nervous Leg / Restless Leg Syndrome  . Promethazine Other (See Comments)    Uncontrolled leg shaking  . Sulfa Antibiotics Photosensitivity and Rash    Burning Rash    Labs:  Recent Labs  12/04/13 0550 12/04/13 2145 12/05/13 0334 12/06/13 0407  HGB 12.4  --  12.0 11.6*  HCT 36.5  --  35.7* 34.2*  PLT 192  --  185 150  LABPROT 15.3*  --  15.9* 19.2*  INR 1.24  --  1.30 1.67*  HEPARINUNFRC  --  0.58 0.61 0.67  CREATININE 0.50  --  0.53 0.57    Estimated Creatinine Clearance: 61.9 ml/min (by C-G formula based on Cr of 0.57).   Assessment: 68yo female with AFib, on heparin bridge with Coumadin due to sub-therapeutic INR on admission.    Heparin level therapeutic, INR increasing = 1.67 this AM  Goal of Therapy:  INR 2-3 Heparin level 0.3-0.7 units/ml Monitor platelets by anticoagulation protocol: Yes   Plan:  1-  Continue Heparin 1100 units/hr 2-  Coumadin 4mg  today 3-  Continue daily INR, HL, and CBC  Thank you. Anette Guarneri, PharmD [phone]

## 2013-12-06 NOTE — Progress Notes (Signed)
  Subjective: Tolerating po with less pain  Objective: Vital signs in last 24 hours: Temp:  [97.5 F (36.4 C)-97.8 F (36.6 C)] 97.7 F (36.5 C) (01/24 0409) Pulse Rate:  [50-67] 67 (01/24 0409) Resp:  [18-20] 19 (01/24 0409) BP: (120-154)/(55-80) 154/72 mmHg (01/24 0409) SpO2:  [96 %-100 %] 96 % (01/24 0409) Weight:  [152 lb 6.4 oz (69.128 kg)] 152 lb 6.4 oz (69.128 kg) (01/24 0409) Last BM Date: 12/03/13  Intake/Output from previous day: 01/23 0701 - 01/24 0700 In: 3499.1 [P.O.:1200; I.V.:1499.1; IV Piggyback:800] Out: 2000 [Urine:2000] Intake/Output this shift: Total I/O In: 1103.7 [I.V.:1103.7] Out: 1300 [Urine:1300]  Looks comfortable Abdomen soft with mild LLQ tenderness  Lab Results:   Recent Labs  12/05/13 0334 12/06/13 0407  WBC 5.6 5.9  HGB 12.0 11.6*  HCT 35.7* 34.2*  PLT 185 150   BMET  Recent Labs  12/05/13 0334 12/06/13 0407  NA 143 146  K 3.5* 4.7  CL 108 112  CO2 23 21  GLUCOSE 102* 105*  BUN 5* 9  CREATININE 0.53 0.57  CALCIUM 7.7* 8.0*   PT/INR  Recent Labs  12/05/13 0334 12/06/13 0407  LABPROT 15.9* 19.2*  INR 1.30 1.67*   ABG No results found for this basename: PHART, PCO2, PO2, HCO3,  in the last 72 hours  Studies/Results: No results found.  Anti-infectives: Anti-infectives   Start     Dose/Rate Route Frequency Ordered Stop   12/03/13 2200  ciprofloxacin (CIPRO) IVPB 400 mg     400 mg 200 mL/hr over 60 Minutes Intravenous Every 12 hours 12/03/13 2155     12/03/13 2200  metroNIDAZOLE (FLAGYL) IVPB 500 mg     500 mg 100 mL/hr over 60 Minutes Intravenous Every 8 hours 12/03/13 2155     12/03/13 2030  ciprofloxacin (CIPRO) tablet 500 mg     500 mg Oral  Once 12/03/13 2025 12/03/13 2147   12/03/13 2030  metroNIDAZOLE (FLAGYL) tablet 500 mg     500 mg Oral  Once 12/03/13 2025 12/03/13 2146      Assessment/Plan: s/p * No surgery found *  Diverticulitis with abscess  Continues to improve.  Discharge tomorrow if  INR ok and follow up with Dr. Barry Dienes ([facility]) as outpt  LOS: 3 days    Tayona Sarnowski A 12/06/2013

## 2013-12-06 NOTE — Progress Notes (Signed)
Patient Demographics  [name], is a 68 y.o. female, DOB - 06/13/1946, QQI:[id number]  Admit date - 12/03/2013   Admitting Physician Shanda Howells, MD  Outpatient Primary MD for the patient is [name], MD  LOS - 3   Chief Complaint  Patient presents with  . Abdominal Pain        Assessment & Plan    1. Acute Diverticulitis with possible tiny left lower quadrant diverticular abscess. She is much improved, continue bowel rest, IV fluids IV antibiotics, general surgery following. Clinically considerably improved.    2. CAD/A fib - will resume home dose aspirin, beta blocker, Imdur and statin. No acute issues. On Coumadin at home, will place on heparin drip without bolus for now.  Lab Results  Component Value Date   INR 1.67* 12/06/2013   INR 1.30 12/05/2013   INR 1.24 12/04/2013       3. Hypertension continue low-dose beta blocker, for now will hold ACE inhibitor     Code Status:  Full  Family Communication: daughter  Disposition Plan: Home   Procedures CT scan abdomen pelvis   Consults  CC[facility]   Medications  Scheduled Meds: . aspirin EC  81 mg Oral QHS  . ciprofloxacin  400 mg Intravenous Q12H  . docusate sodium  200 mg Oral BID  . isosorbide mononitrate  30 mg Oral Daily  . metoprolol succinate  25 mg Oral Daily  . metoprolol succinate  50 mg Oral QHS  . metronidazole  500 mg Intravenous Q8H  . simvastatin  20 mg Oral QPC supper  . sodium chloride  3 mL Intravenous Q12H  . Warfarin - Pharmacist Dosing Inpatient   Does not apply q1800   Continuous Infusions: . heparin 1,100 Units/hr (12/05/13 1245)   PRN Meds:.diphenhydrAMINE, morphine injection  DVT Prophylaxis  Coumadin  Lab Results  Component Value Date   PLT 150 12/06/2013    Antibiotics     Anti-infectives   Start     Dose/Rate Route Frequency Ordered Stop   12/03/13 2200  ciprofloxacin (CIPRO) IVPB 400 mg     400 mg 200 mL/hr over 60 Minutes Intravenous Every 12 hours 12/03/13 2155     12/03/13 2200  metroNIDAZOLE (FLAGYL) IVPB 500 mg     500 mg 100 mL/hr over 60 Minutes Intravenous Every 8 hours 12/03/13 2155     12/03/13 2030  ciprofloxacin (CIPRO) tablet 500 mg     500 mg Oral  Once 12/03/13 2025 12/03/13 2147   12/03/13 2030  metroNIDAZOLE (FLAGYL) tablet 500 mg     500 mg Oral  Once 12/03/13 2025 12/03/13 2146          Subjective:   [name] today has, No headache, No chest pain, mildly improved left lower quadrant abdominal pain - No Nausea, No new weakness tingling or numbness, No Cough - SOB.    Objective:   Filed Vitals:   12/05/13 1030 12/05/13 1345 12/05/13 2014 12/06/13 0409  BP: 120/80 148/72 129/55 154/72  Pulse: 60 50 62 67  Temp:  97.8 F (36.6 C) 97.5 F (36.4 C) 97.7 F (36.5 C)  TempSrc:  Oral Oral Oral  Resp:  20 18 19   Height:      Weight:  69.128 kg (152 lb 6.4 oz)  SpO2:  99% 100% 96%    Wt Readings from Last 3 Encounters:  12/06/13 69.128 kg (152 lb 6.4 oz)  10/21/13 67.495 kg (148 lb 12.8 oz)  07/21/13 66.407 kg (146 lb 6.4 oz)     Intake/Output Summary (Last 24 hours) at 12/06/13 1023 Last data filed at 12/06/13 0829  Gross per 24 hour  Intake   3244 ml  Output   2900 ml  Net    344 ml    Exam Awake Alert, Oriented X 3, No new F.N deficits, Normal affect [place].AT,PERRAL Supple Neck,No JVD, No cervical lymphadenopathy appriciated.  Symmetrical Chest wall movement, Good air movement bilaterally, CTAB RRR,No Gallops,Rubs or new Murmurs, No Parasternal Heave +ve B.Sounds, Abd Soft, LLQ is tender, No organomegaly appriciated, No rebound - guarding or rigidity. No Cyanosis, Clubbing or edema, No new Rash or bruise      Data Review   Micro Results No results found for this or any previous visit (from the past  240 hour(s)).  Radiology Reports Ct Abdomen Pelvis W Contrast  12/03/2013   CLINICAL DATA:  Left lower quadrant pain.  EXAM: CT ABDOMEN AND PELVIS WITH CONTRAST  TECHNIQUE: Multidetector CT imaging of the abdomen and pelvis was performed using the standard protocol following bolus administration of intravenous contrast.  CONTRAST:  173mL OMNIPAQUE IOHEXOL 300 MG/ML  SOLN  COMPARISON:  CT scans dated 07/23/2013 and 07/13/2009  FINDINGS: The patient has focal diverticulitis of the distal descending/sigmoid colon junction in the left lower quadrant. There is what is either a distended inflamed diverticulum or a small diverticular abscess containing fluid and air on image number 51 of series 2. There are numerous diverticula in the colon primarily in the descending and sigmoid regions.  Liver, spleen, pancreas, adrenal glands, and kidneys are normal except for 2 tiny cysts in the liver and a single tiny cyst in the lower pole of the left kidney. There is a 10 mm cyst in the anterior aspect of the left lobe of the liver and a 8 mm cyst in the posterior aspect of the right lobe of the liver and a 6 mm cyst in the lower pole of the left kidney.  There is extensive calcification in the abdominal aorta and iliac arteries. Uterus and ovaries are normal. Bladder is normal. No significant osseous abnormality.  IMPRESSION: Acute diverticulitis in the left lower quadrant with either a single markedly inflamed dilated diverticulum or a tiny diverticular abscess.   Electronically Signed   By: Rozetta Nunnery M.D.   On: 12/03/2013 20:20    CBC  Recent Labs Lab 12/03/13 1407 12/04/13 0550 12/05/13 0334 12/06/13 0407  WBC 9.9 5.6 5.6 5.9  HGB 14.1 12.4 12.0 11.6*  HCT 40.7 36.5 35.7* 34.2*  PLT 244 192 185 150  MCV 91.7 91.7 91.8 91.7  MCH 31.8 31.2 30.8 31.1  MCHC 34.6 34.0 33.6 33.9  RDW 13.0 13.0 13.1 13.0  LYMPHSABS 3.0 2.1 2.5 2.7  MONOABS 0.7 0.5 0.4 0.5  EOSABS 0.0 0.0 0.1 0.1  BASOSABS 0.0 0.0 0.0 0.0     Chemistries   Recent Labs Lab 12/03/13 1407 12/04/13 0550 12/05/13 0334 12/06/13 0407  NA 136* 139 143 146  K 4.2 3.6* 3.5* 4.7  CL 97 105 108 112  CO2 26 21 23 21   GLUCOSE 112* 99 102* 105*  BUN 11 6 5* 9  CREATININE 0.60 0.50 0.53 0.57  CALCIUM 8.8 7.9* 7.7* 8.0*  AST  22 16  --   --   ALT 19 13  --   --   ALKPHOS 52 41  --   --   BILITOT 0.5 0.5  --   --    ------------------------------------------------------------------------------------------------------------------ estimated creatinine clearance is 61.9 ml/min (by C-G formula based on Cr of 0.57). ------------------------------------------------------------------------------------------------------------------ No results found for this basename: HGBA1C,  in the last 72 hours ------------------------------------------------------------------------------------------------------------------ No results found for this basename: CHOL, HDL, LDLCALC, TRIG, CHOLHDL, LDLDIRECT,  in the last 72 hours ------------------------------------------------------------------------------------------------------------------ No results found for this basename: TSH, T4TOTAL, FREET3, T3FREE, THYROIDAB,  in the last 72 hours ------------------------------------------------------------------------------------------------------------------ No results found for this basename: VITAMINB12, FOLATE, FERRITIN, TIBC, IRON, RETICCTPCT,  in the last 72 hours  Coagulation profile  Recent Labs Lab 12/03/13 2223 12/04/13 0550 12/05/13 0334 12/06/13 0407  INR 1.25 1.24 1.30 1.67*    No results found for this basename: DDIMER,  in the last 72 hours  Cardiac Enzymes No results found for this basename: CK, CKMB, TROPONINI, MYOGLOBIN,  in the last 168 hours ------------------------------------------------------------------------------------------------------------------ No components found with this basename: POCBNP,      Time Spent in minutes    35   Lavella Myren K M.D on 12/06/2013 at 10:23 AM  Between 7am to 7pm - Pager - [phone]  After 7pm go to [email or web address] - password [email or web address]  And look for the night coverage person covering for me after hours  [facility] Office  [phone]

## 2013-12-06 NOTE — Progress Notes (Addendum)
Nursing note. Patient ambulated in hallway with daughter. 300 feet. Patient with complaints of shortness of breath when ambulating. Back in room in chair. Will continue to monitor patient. Dr. Candiss Norse made aware of shortness of breath, and orders received will monitor patient.  Opal Dinning, Bettina Gavia RN

## 2013-12-07 ENCOUNTER — Inpatient Hospital Stay (HOSPITAL_COMMUNITY): Payer: Medicare Other

## 2013-12-07 LAB — POTASSIUM: Potassium: 3.8 mEq/L (ref 3.7–5.3)

## 2013-12-07 LAB — PROTIME-INR
INR: 1.84 — ABNORMAL HIGH (ref 0.00–1.49)
Prothrombin Time: 20.7 seconds — ABNORMAL HIGH (ref 11.6–15.2)

## 2013-12-07 LAB — HEPARIN LEVEL (UNFRACTIONATED): Heparin Unfractionated: 0.72 IU/mL — ABNORMAL HIGH (ref 0.30–0.70)

## 2013-12-07 MED ORDER — METRONIDAZOLE 500 MG PO TABS: 500.0000 mg | ORAL_TABLET | Freq: Three times a day (TID) | ORAL | Status: DC

## 2013-12-07 MED ORDER — CIPROFLOXACIN HCL 500 MG PO TABS: 500.0000 mg | ORAL_TABLET | Freq: Two times a day (BID) | ORAL | Status: DC

## 2013-12-07 MED ORDER — DOCUSATE SODIUM 100 MG PO CAPS: 100.0000 mg | ORAL_CAPSULE | Freq: Two times a day (BID) | ORAL | Status: DC | PRN

## 2013-12-07 NOTE — Progress Notes (Signed)
Patient walked 500 feet this morning without exertion. Kayleen Memos RN
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
## 2013-12-07 NOTE — Progress Notes (Signed)
Nursing Note Patient given discharge instructions, med list and AVS prescriptions sent to patient pharmacy. Patient concerned with [facility] apt. DR Candiss Norse made aware and sated he would make the change. Patient given new information on follow up. Will discharge home as ordered. Grabiela Wohlford, Bettina Gavia RN

## 2013-12-07 NOTE — Discharge Instructions (Signed)
Follow with Primary MD HEDRICK,JAMES, MD in 1-2 days   Get CBC, CMP, INR checked 1-2 days by Primary MD and again as instructed by your Primary MD.     Activity: As tolerated with Full fall precautions use walker/cane & assistance as needed   Disposition Home    Diet: Soft - Heart Healthy   For Heart failure patients - Check your Weight same time everyday, if you gain over 2 pounds, or you develop in leg swelling, experience more shortness of breath or chest pain, call your Primary MD immediately. Follow Cardiac Low Salt Diet and 1.8 lit/day fluid restriction.   On your next visit with her primary care physician please Get Medicines reviewed and adjusted.  Please request your Prim.MD to go over all Hospital Tests and Procedure/Radiological results at the follow up, please get all Hospital records sent to your Prim MD by signing hospital release before you go home.   If you experience worsening of your admission symptoms, develop shortness of breath, life threatening emergency, suicidal or homicidal thoughts you must seek medical attention immediately by calling 911 or calling your MD immediately  if symptoms less severe.  You Must read complete instructions/literature along with all the possible adverse reactions/side effects for all the Medicines you take and that have been prescribed to you. Take any new Medicines after you have completely understood and accpet all the possible adverse reactions/side effects.   Do not drive and provide baby sitting services if your were admitted for syncope or siezures until you have seen by Primary MD or a Neurologist and advised to do so again.  Do not drive when taking Pain medications.    Do not take more than prescribed Pain, Sleep and Anxiety Medications  Special Instructions: If you have smoked or chewed Tobacco  in the last 2 yrs please stop smoking, stop any regular Alcohol  and or any Recreational drug use.  Wear Seat belts while  driving.   Please note  You were cared for by a hospitalist during your hospital stay. If you have any questions about your discharge medications or the care you received while you were in the hospital after you are discharged, you can call the unit and asked to speak with the hospitalist on call if the hospitalist that took care of you is not available. Once you are discharged, your primary care physician will handle any further medical issues. Please note that NO REFILLS for any discharge medications will be authorized once you are discharged, as it is imperative that you return to your primary care physician (or establish a relationship with a primary care physician if you do not have one) for your aftercare needs so that they can reassess your need for medications and monitor your lab values.

## 2013-12-07 NOTE — Progress Notes (Signed)
Patient walked 350 feet. Tolerated it well. Kayleen Memos RN

## 2013-12-07 NOTE — Progress Notes (Signed)
Subjective: PT with no c/o  Objective: Vital signs in last 24 hours: Temp:  [97.3 F (36.3 C)-98.3 F (36.8 C)] 97.8 F (36.6 C) (01/25 0438) Pulse Rate:  [54-58] 55 (01/25 0438) Resp:  [16-18] 18 (01/25 0438) BP: (139-157)/(57-78) 151/69 mmHg (01/25 0438) SpO2:  [95 %-98 %] 96 % (01/25 0438) Weight:  [149 lb 8 oz (67.813 kg)] 149 lb 8 oz (67.813 kg) (01/25 0438) Last BM Date: 12/06/13  Intake/Output from previous day: 01/24 0701 - 01/25 0700 In: 1886.6 [P.O.:120; I.V.:1166.6; IV Piggyback:600] Out: 1610 [Urine:7375] Intake/Output this shift:    General appearance: alert and cooperative GI: soft, non-tender; bowel sounds normal; no masses,  no organomegaly  Lab Results:   Recent Labs  12/05/13 0334 12/06/13 0407  WBC 5.6 5.9  HGB 12.0 11.6*  HCT 35.7* 34.2*  PLT 185 150   BMET  Recent Labs  12/05/13 0334 12/06/13 0407 12/07/13 0340  NA 143 146  --   K 3.5* 4.7 3.8  CL 108 112  --   CO2 23 21  --   GLUCOSE 102* 105*  --   BUN 5* 9  --   CREATININE 0.53 0.57  --   CALCIUM 7.7* 8.0*  --    PT/INR  Recent Labs  12/06/13 0407 12/07/13 0340  LABPROT 19.2* 20.7*  INR 1.67* 1.84*   ABG No results found for this basename: PHART, PCO2, PO2, HCO3,  in the last 72 hours  Studies/Results: Dg Chest 2 View  12/07/2013   CLINICAL DATA:  Chest pain.  Coronary artery disease.  EXAM: CHEST  2 VIEW  COMPARISON:  12/06/2013  FINDINGS: The heart size and mediastinal contours are within normal limits. Both lungs are clear. Prior CABG again noted. Old lower thoracic vertebral body compression fractures are stable.  IMPRESSION: No active cardiopulmonary disease.   Electronically Signed   By: Earle Gell M.D.   On: 12/07/2013 07:44   Dg Chest 2 View  12/06/2013   CLINICAL DATA:  Shortness of breath.  EXAM: CHEST  2 VIEW  COMPARISON:  02/21/2013.  10/18/2007.  FINDINGS: Mediastinum and hilar structures are normal. Mild cardiomegaly with mild pulmonary vascular  prominence and interstitial prominence noted. Kerley B-lines are noted. Tiny left pleural effusion cannot be excluded. These findings are consistent with mild congestive heart failure with pulmonary interstitial edema. Prior CABG. Diffuse osteopenia degenerative change thoracic spine. Moderate lower thoracic vertebral body compression fracture noted, this is new from 2008. No retropulsed fragments.  IMPRESSION: 1. Mild congestive heart failure and interstitial pulmonary edema. Prior CABG. 2. Lower thoracic spine vertebral body moderate compression fracture, new from 2008.   Electronically Signed   By: Marcello Moores  Register   On: 12/06/2013 17:41    Anti-infectives: Anti-infectives   Start     Dose/Rate Route Frequency Ordered Stop   12/03/13 2200  ciprofloxacin (CIPRO) IVPB 400 mg     400 mg 200 mL/hr over 60 Minutes Intravenous Every 12 hours 12/03/13 2155     12/03/13 2200  metroNIDAZOLE (FLAGYL) IVPB 500 mg     500 mg 100 mL/hr over 60 Minutes Intravenous Every 8 hours 12/03/13 2155     12/03/13 2030  ciprofloxacin (CIPRO) tablet 500 mg     500 mg Oral  Once 12/03/13 2025 12/03/13 2147   12/03/13 2030  metroNIDAZOLE (FLAGYL) tablet 500 mg     500 mg Oral  Once 12/03/13 2025 12/03/13 2146      Assessment/Plan: s/p * No surgery found * Pt  OK for DC Pt will need f/u with GI x 6 weeks fo screening c-scope   LOS: 4 days    Rosario Jacks., [facility] 12/07/2013

## 2013-12-08 ENCOUNTER — Ambulatory Visit (INDEPENDENT_AMBULATORY_CARE_PROVIDER_SITE_OTHER): Payer: Medicare Other | Admitting: Pharmacist Clinician (PhC)/ Clinical Pharmacy Specialist

## 2013-12-08 VITALS — BP 154/96 | HR 64

## 2013-12-08 DIAGNOSIS — I480 Paroxysmal atrial fibrillation: Secondary | ICD-10-CM

## 2013-12-08 DIAGNOSIS — Z7901 Long term (current) use of anticoagulants: Secondary | ICD-10-CM

## 2013-12-08 DIAGNOSIS — I4891 Unspecified atrial fibrillation: Secondary | ICD-10-CM

## 2013-12-08 LAB — POCT INR: INR: 2.5

## 2013-12-09 ENCOUNTER — Other Ambulatory Visit: Payer: Self-pay | Admitting: Cardiovascular Disease

## 2013-12-09 ENCOUNTER — Other Ambulatory Visit: Payer: Self-pay | Admitting: Cardiology

## 2013-12-09 NOTE — ED Provider Notes (Signed)
I saw and evaluated the patient, reviewed the resident's note and I agree with the findings and plan.  EKG Interpretation    Date/Time:    Ventricular Rate:    PR Interval:    QRS Duration:   QT Interval:    QTC Calculation:   R Axis:     Text Interpretation:              Patient seen and evaluated. Has some tenderness in the left lower abdomen. Hasn't signs of peritoneal irritation. Not frankly rigid. She does not appear septic or toxic. History is consistent with diverticulitis. Care was discussed with surgery by Dr. Tyrone Nine.  Arrangements were made for medicine admit. Antibiotics were started in the emergency room.  Tanna Furry, MD 12/09/13 [phone]

## 2013-12-10 NOTE — Telephone Encounter (Signed)
Rx was sent to pharmacy electronically. 

## 2013-12-12 ENCOUNTER — Other Ambulatory Visit: Payer: Self-pay

## 2013-12-12 ENCOUNTER — Ambulatory Visit (INDEPENDENT_AMBULATORY_CARE_PROVIDER_SITE_OTHER): Payer: Medicare Other | Admitting: Pharmacist Clinician (PhC)/ Clinical Pharmacy Specialist

## 2013-12-12 VITALS — BP 138/78 | HR 60

## 2013-12-12 DIAGNOSIS — I4891 Unspecified atrial fibrillation: Secondary | ICD-10-CM

## 2013-12-12 DIAGNOSIS — Z7901 Long term (current) use of anticoagulants: Secondary | ICD-10-CM

## 2013-12-12 DIAGNOSIS — I480 Paroxysmal atrial fibrillation: Secondary | ICD-10-CM

## 2013-12-12 LAB — POCT INR: INR: 2.2

## 2013-12-12 MED ORDER — METOPROLOL SUCCINATE ER 50 MG PO TB24: ORAL_TABLET | ORAL | Status: DC

## 2013-12-12 NOTE — Telephone Encounter (Signed)
Rx was sent to pharmacy electronically. 

## 2013-12-15 ENCOUNTER — Encounter (INDEPENDENT_AMBULATORY_CARE_PROVIDER_SITE_OTHER): Payer: Self-pay | Admitting: General Surgery

## 2013-12-15 ENCOUNTER — Ambulatory Visit (INDEPENDENT_AMBULATORY_CARE_PROVIDER_SITE_OTHER): Payer: Medicare Other | Admitting: General Surgery

## 2013-12-15 VITALS — BP 128/80 | HR 60 | Temp 98.6°F | Resp 14 | Ht 63.0 in | Wt 147.6 lb

## 2013-12-15 DIAGNOSIS — K5732 Diverticulitis of large intestine without perforation or abscess without bleeding: Secondary | ICD-10-CM

## 2013-12-15 DIAGNOSIS — K5792 Diverticulitis of intestine, part unspecified, without perforation or abscess without bleeding: Secondary | ICD-10-CM

## 2013-12-15 NOTE — Assessment & Plan Note (Signed)
Resolution of diverticulitis. She is only 48 hours after completion of antibiotics.  She has appointment with GI tomorrow. I like her to get a colonoscopy in around 6 weeks.  I will see her back in 3 months. I advised her to stay on the low fiber diet until the colonoscopy.
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
## 2013-12-15 NOTE — Patient Instructions (Addendum)
Follow up in 3 months.    Maintain low fiber diet until then.

## 2013-12-16 ENCOUNTER — Ambulatory Visit (INDEPENDENT_AMBULATORY_CARE_PROVIDER_SITE_OTHER): Payer: Medicare Other | Admitting: Pharmacist Clinician (PhC)/ Clinical Pharmacy Specialist

## 2013-12-16 ENCOUNTER — Ambulatory Visit (INDEPENDENT_AMBULATORY_CARE_PROVIDER_SITE_OTHER): Payer: Medicare Other | Admitting: Cardiovascular Disease

## 2013-12-16 ENCOUNTER — Encounter: Payer: Self-pay | Admitting: Cardiovascular Disease

## 2013-12-16 VITALS — BP 124/70 | HR 54 | Ht 63.0 in | Wt 150.5 lb

## 2013-12-16 DIAGNOSIS — I2510 Atherosclerotic heart disease of native coronary artery without angina pectoris: Secondary | ICD-10-CM

## 2013-12-16 DIAGNOSIS — E785 Hyperlipidemia, unspecified: Secondary | ICD-10-CM

## 2013-12-16 DIAGNOSIS — I4891 Unspecified atrial fibrillation: Secondary | ICD-10-CM

## 2013-12-16 DIAGNOSIS — K5792 Diverticulitis of intestine, part unspecified, without perforation or abscess without bleeding: Secondary | ICD-10-CM

## 2013-12-16 DIAGNOSIS — I10 Essential (primary) hypertension: Secondary | ICD-10-CM

## 2013-12-16 DIAGNOSIS — K5732 Diverticulitis of large intestine without perforation or abscess without bleeding: Secondary | ICD-10-CM

## 2013-12-16 DIAGNOSIS — Z7901 Long term (current) use of anticoagulants: Secondary | ICD-10-CM

## 2013-12-16 DIAGNOSIS — I480 Paroxysmal atrial fibrillation: Secondary | ICD-10-CM

## 2013-12-16 LAB — POCT INR: INR: 1.7

## 2013-12-16 MED ORDER — LISINOPRIL 20 MG PO TABS
20.0000 mg | ORAL_TABLET | Freq: Two times a day (BID) | ORAL | Status: DC
Start: 2013-12-16 — End: 2013-12-27
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
## 2013-12-16 NOTE — Patient Instructions (Signed)
Your physician has recommended you make the following change in your medication: increase the lisinopril to 20mg  twice daily. This change has already been sent to the pharmacy.  Your physician recommends that you schedule a follow-up appointment in: 3 months.

## 2013-12-18 ENCOUNTER — Telehealth: Payer: Self-pay | Admitting: *Deleted

## 2013-12-18 ENCOUNTER — Telehealth: Payer: Self-pay | Admitting: Cardiovascular Disease

## 2013-12-18 NOTE — Telephone Encounter (Signed)
Message forwarded to Dr. Kelly/Wanda, CMA.  

## 2013-12-18 NOTE — Telephone Encounter (Signed)
She is scjeduled for a colonoscopy on March 25th.She need to know if it is all right to sedate the patient for the procedure.

## 2013-12-18 NOTE — Telephone Encounter (Signed)
Faxed colonoscopy clearance to Dr. Michail Sermon. ( [facility])

## 2013-12-19 NOTE — Telephone Encounter (Signed)
Informed patient clearance has already been sent to have her colonoscopy. She was concerned about the type of sedation being used. I stated to her that if the MD has any concerns with the type of sedation being used and her condition he will contact us for further clearance.

## 2013-12-24 ENCOUNTER — Telehealth: Payer: Self-pay | Admitting: *Deleted

## 2013-12-24 NOTE — Telephone Encounter (Signed)
Pt was calling in regards to sedation for a procedure. She had some questions regarding this. She asked to speak to you.  TK

## 2013-12-25 NOTE — Telephone Encounter (Signed)
Pt to have colonoscopy in late March, states her GI dr wants to do deeper sedation than normal, using propofol.  Pt is concerned and would like to be sure that Dr. Claiborne Billings is okay with the deeper sedation.  Advised her that I will speak with Dr. Claiborne Billings next week and let her know.

## 2013-12-27 ENCOUNTER — Inpatient Hospital Stay (HOSPITAL_COMMUNITY)
Admission: EM | Admit: 2013-12-27 | Discharge: 2014-01-03 | DRG: 329 | Disposition: A | Discharge status: Skilled Nursing Facility | Payer: Medicare Other | Attending: Internal Medicine | Admitting: Internal Medicine

## 2013-12-27 ENCOUNTER — Encounter (HOSPITAL_COMMUNITY): Payer: Self-pay | Admitting: Emergency Medicine

## 2013-12-27 ENCOUNTER — Emergency Department (HOSPITAL_COMMUNITY): Payer: Medicare Other

## 2013-12-27 DIAGNOSIS — Z87891 Personal history of nicotine dependence: Secondary | ICD-10-CM

## 2013-12-27 DIAGNOSIS — K5792 Diverticulitis of intestine, part unspecified, without perforation or abscess without bleeding: Secondary | ICD-10-CM

## 2013-12-27 DIAGNOSIS — Z882 Allergy status to sulfonamides status: Secondary | ICD-10-CM

## 2013-12-27 DIAGNOSIS — Z79899 Other long term (current) drug therapy: Secondary | ICD-10-CM

## 2013-12-27 DIAGNOSIS — I25118 Atherosclerotic heart disease of native coronary artery with other forms of angina pectoris: Secondary | ICD-10-CM | POA: Diagnosis present

## 2013-12-27 DIAGNOSIS — Z7901 Long term (current) use of anticoagulants: Secondary | ICD-10-CM

## 2013-12-27 DIAGNOSIS — K5780 Diverticulitis of intestine, part unspecified, with perforation and abscess without bleeding: Secondary | ICD-10-CM | POA: Diagnosis present

## 2013-12-27 DIAGNOSIS — L03119 Cellulitis of unspecified part of limb: Secondary | ICD-10-CM | POA: Diagnosis not present

## 2013-12-27 DIAGNOSIS — Z85828 Personal history of other malignant neoplasm of skin: Secondary | ICD-10-CM

## 2013-12-27 DIAGNOSIS — E8779 Other fluid overload: Secondary | ICD-10-CM | POA: Diagnosis not present

## 2013-12-27 DIAGNOSIS — Z9089 Acquired absence of other organs: Secondary | ICD-10-CM

## 2013-12-27 DIAGNOSIS — I4891 Unspecified atrial fibrillation: Secondary | ICD-10-CM | POA: Diagnosis present

## 2013-12-27 DIAGNOSIS — R339 Retention of urine, unspecified: Secondary | ICD-10-CM | POA: Diagnosis present

## 2013-12-27 DIAGNOSIS — A419 Sepsis, unspecified organism: Secondary | ICD-10-CM | POA: Diagnosis present

## 2013-12-27 DIAGNOSIS — I480 Paroxysmal atrial fibrillation: Secondary | ICD-10-CM | POA: Diagnosis present

## 2013-12-27 DIAGNOSIS — F411 Generalized anxiety disorder: Secondary | ICD-10-CM | POA: Diagnosis present

## 2013-12-27 DIAGNOSIS — Z951 Presence of aortocoronary bypass graft: Secondary | ICD-10-CM

## 2013-12-27 DIAGNOSIS — I252 Old myocardial infarction: Secondary | ICD-10-CM

## 2013-12-27 DIAGNOSIS — Z888 Allergy status to other drugs, medicaments and biological substances status: Secondary | ICD-10-CM

## 2013-12-27 DIAGNOSIS — K5732 Diverticulitis of large intestine without perforation or abscess without bleeding: Principal | ICD-10-CM | POA: Diagnosis present

## 2013-12-27 DIAGNOSIS — R079 Chest pain, unspecified: Secondary | ICD-10-CM

## 2013-12-27 DIAGNOSIS — E876 Hypokalemia: Secondary | ICD-10-CM | POA: Diagnosis not present

## 2013-12-27 DIAGNOSIS — I2510 Atherosclerotic heart disease of native coronary artery without angina pectoris: Secondary | ICD-10-CM

## 2013-12-27 DIAGNOSIS — I4510 Unspecified right bundle-branch block: Secondary | ICD-10-CM | POA: Diagnosis present

## 2013-12-27 DIAGNOSIS — M3500 Sicca syndrome, unspecified: Secondary | ICD-10-CM | POA: Diagnosis present

## 2013-12-27 DIAGNOSIS — Z880 Allergy status to penicillin: Secondary | ICD-10-CM

## 2013-12-27 DIAGNOSIS — I25810 Atherosclerosis of coronary artery bypass graft(s) without angina pectoris: Secondary | ICD-10-CM

## 2013-12-27 DIAGNOSIS — E7800 Pure hypercholesterolemia, unspecified: Secondary | ICD-10-CM | POA: Diagnosis present

## 2013-12-27 DIAGNOSIS — K650 Generalized (acute) peritonitis: Secondary | ICD-10-CM | POA: Diagnosis present

## 2013-12-27 DIAGNOSIS — M8580 Other specified disorders of bone density and structure, unspecified site: Secondary | ICD-10-CM

## 2013-12-27 DIAGNOSIS — L02419 Cutaneous abscess of limb, unspecified: Secondary | ICD-10-CM | POA: Diagnosis not present

## 2013-12-27 DIAGNOSIS — E785 Hyperlipidemia, unspecified: Secondary | ICD-10-CM | POA: Diagnosis present

## 2013-12-27 DIAGNOSIS — I739 Peripheral vascular disease, unspecified: Secondary | ICD-10-CM | POA: Diagnosis present

## 2013-12-27 DIAGNOSIS — I10 Essential (primary) hypertension: Secondary | ICD-10-CM | POA: Diagnosis present

## 2013-12-27 DIAGNOSIS — B358 Other dermatophytoses: Secondary | ICD-10-CM | POA: Diagnosis not present

## 2013-12-27 DIAGNOSIS — Z807 Family history of other malignant neoplasms of lymphoid, hematopoietic and related tissues: Secondary | ICD-10-CM

## 2013-12-27 DIAGNOSIS — Z8249 Family history of ischemic heart disease and other diseases of the circulatory system: Secondary | ICD-10-CM

## 2013-12-27 DIAGNOSIS — D649 Anemia, unspecified: Secondary | ICD-10-CM | POA: Diagnosis not present

## 2013-12-27 DIAGNOSIS — Z7982 Long term (current) use of aspirin: Secondary | ICD-10-CM

## 2013-12-27 LAB — URINALYSIS, ROUTINE W REFLEX MICROSCOPIC
Bilirubin Urine: NEGATIVE
Glucose, UA: NEGATIVE mg/dL
Ketones, ur: 15 mg/dL — AB
Leukocytes, UA: NEGATIVE
Nitrite: NEGATIVE
Protein, ur: NEGATIVE mg/dL
Specific Gravity, Urine: 1.015 (ref 1.005–1.030)
Urobilinogen, UA: 0.2 mg/dL (ref 0.0–1.0)
pH: 5.5 (ref 5.0–8.0)

## 2013-12-27 LAB — CBC WITH DIFFERENTIAL/PLATELET
Basophils Absolute: 0 10*3/uL (ref 0.0–0.1)
Basophils Relative: 0 % (ref 0–1)
Eosinophils Absolute: 0 10*3/uL (ref 0.0–0.7)
Eosinophils Relative: 0 % (ref 0–5)
HCT: 39.4 % (ref 36.0–46.0)
Hemoglobin: 13.9 g/dL (ref 12.0–15.0)
Lymphocytes Relative: 15 % (ref 12–46)
Lymphs Abs: 1.9 10*3/uL (ref 0.7–4.0)
MCH: 31.8 pg (ref 26.0–34.0)
MCHC: 35.3 g/dL (ref 30.0–36.0)
MCV: 90.2 fL (ref 78.0–100.0)
Monocytes Absolute: 0.7 10*3/uL (ref 0.1–1.0)
Monocytes Relative: 6 % (ref 3–12)
Neutro Abs: 9.6 10*3/uL — ABNORMAL HIGH (ref 1.7–7.7)
Neutrophils Relative %: 79 % — ABNORMAL HIGH (ref 43–77)
Platelets: 245 10*3/uL (ref 150–400)
RBC: 4.37 MIL/uL (ref 3.87–5.11)
RDW: 12.9 % (ref 11.5–15.5)
WBC: 12.2 10*3/uL — ABNORMAL HIGH (ref 4.0–10.5)

## 2013-12-27 LAB — COMPREHENSIVE METABOLIC PANEL
ALT: 26 U/L (ref 0–35)
AST: 25 U/L (ref 0–37)
Albumin: 3.8 g/dL (ref 3.5–5.2)
Alkaline Phosphatase: 45 U/L (ref 39–117)
BUN: 19 mg/dL (ref 6–23)
CO2: 23 mEq/L (ref 19–32)
Calcium: 8.9 mg/dL (ref 8.4–10.5)
Chloride: 96 mEq/L (ref 96–112)
Creatinine, Ser: 0.7 mg/dL (ref 0.50–1.10)
GFR calc Af Amer: >90 mL/min (ref >90)
GFR calc non Af Amer: 86 mL/min — ABNORMAL LOW (ref >90)
Glucose, Bld: 96 mg/dL (ref 70–99)
Potassium: 4.5 mEq/L (ref 3.7–5.3)
Sodium: 134 mEq/L — ABNORMAL LOW (ref 137–147)
Total Bilirubin: 0.4 mg/dL (ref 0.3–1.2)
Total Protein: 6.8 g/dL (ref 6.0–8.3)

## 2013-12-27 LAB — URINE MICROSCOPIC-ADD ON

## 2013-12-27 LAB — LIPASE, BLOOD: Lipase: 32 U/L (ref 11–59)

## 2013-12-27 MED ORDER — CIPROFLOXACIN IN D5W 400 MG/200ML IV SOLN
400.0000 mg | Freq: Once | INTRAVENOUS | Status: DC
Start: 2013-12-27 — End: 2013-12-27
  Filled 2013-12-27: qty 200

## 2013-12-27 MED ORDER — ERTAPENEM SODIUM 1 G IJ SOLR
1.0000 g | Freq: Once | INTRAMUSCULAR | Status: AC
  Administered 2013-12-28: 1 g via INTRAVENOUS
  Filled 2013-12-27: qty 1

## 2013-12-27 MED ORDER — ONDANSETRON HCL 4 MG/2ML IJ SOLN
4.0000 mg | Freq: Once | INTRAMUSCULAR | Status: DC
  Filled 2013-12-27: qty 2

## 2013-12-27 MED ORDER — METRONIDAZOLE IN NACL 5-0.79 MG/ML-% IV SOLN: 500.0000 mg | Freq: Once | INTRAVENOUS | Status: DC

## 2013-12-27 MED ORDER — SODIUM CHLORIDE 0.9 % IV SOLN
1000.0000 mL | INTRAVENOUS | Status: DC
  Administered 2013-12-27: 1000 mL via INTRAVENOUS
  Administered 2013-12-28 (×2): via INTRAVENOUS

## 2013-12-27 MED ORDER — IOHEXOL 300 MG/ML  SOLN
20.0000 mL | INTRAMUSCULAR | Status: AC
  Administered 2013-12-27: 25 mL via ORAL

## 2013-12-27 MED ORDER — FENTANYL CITRATE 0.05 MG/ML IJ SOLN
25.0000 ug | Freq: Once | INTRAMUSCULAR | Status: AC
  Administered 2013-12-27: 25 ug via INTRAVENOUS
  Filled 2013-12-27: qty 2

## 2013-12-27 NOTE — ED Notes (Signed)
Pt to ED via [facility] for evaluation of abdominal pain- pt had similar pain 3 weeks ago, was seen and dx with diverticulitis.  Pt reports she completed 2 antibiotics and had some relief.  Pain returned 5 hours ago, denies taking anything for pain at home.
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
## 2013-12-28 ENCOUNTER — Encounter (HOSPITAL_COMMUNITY): Payer: Self-pay | Admitting: Internal Medicine

## 2013-12-28 ENCOUNTER — Inpatient Hospital Stay (HOSPITAL_COMMUNITY): Payer: Medicare Other | Admitting: Anesthesiology

## 2013-12-28 ENCOUNTER — Encounter (HOSPITAL_COMMUNITY)
Admission: EM | Disposition: A | Discharge status: Skilled Nursing Facility | Payer: Self-pay | Source: Home / Self Care | Attending: Internal Medicine

## 2013-12-28 ENCOUNTER — Encounter (HOSPITAL_COMMUNITY): Payer: Medicare Other | Admitting: Anesthesiology

## 2013-12-28 DIAGNOSIS — I4891 Unspecified atrial fibrillation: Secondary | ICD-10-CM

## 2013-12-28 DIAGNOSIS — I2510 Atherosclerotic heart disease of native coronary artery without angina pectoris: Secondary | ICD-10-CM

## 2013-12-28 DIAGNOSIS — D72829 Elevated white blood cell count, unspecified: Secondary | ICD-10-CM

## 2013-12-28 DIAGNOSIS — K5732 Diverticulitis of large intestine without perforation or abscess without bleeding: Principal | ICD-10-CM

## 2013-12-28 DIAGNOSIS — A419 Sepsis, unspecified organism: Secondary | ICD-10-CM | POA: Diagnosis present

## 2013-12-28 DIAGNOSIS — K5780 Diverticulitis of intestine, part unspecified, with perforation and abscess without bleeding: Secondary | ICD-10-CM | POA: Diagnosis present

## 2013-12-28 DIAGNOSIS — R079 Chest pain, unspecified: Secondary | ICD-10-CM

## 2013-12-28 HISTORY — PX: COLOSTOMY REVISION: SHX5232

## 2013-12-28 HISTORY — PX: COLOSTOMY: SHX63

## 2013-12-28 HISTORY — PX: LAPAROTOMY: SHX154

## 2013-12-28 LAB — MRSA PCR SCREENING: MRSA by PCR: NEGATIVE

## 2013-12-28 LAB — TROPONIN I
Troponin I: <0.3 ng/mL (ref <0.30)
Troponin I: <0.3 ng/mL (ref <0.30)
Troponin I: <0.3 ng/mL (ref <0.30)

## 2013-12-28 LAB — CBC
HCT: 33 % — ABNORMAL LOW (ref 36.0–46.0)
Hemoglobin: 11.6 g/dL — ABNORMAL LOW (ref 12.0–15.0)
MCH: 31.4 pg (ref 26.0–34.0)
MCHC: 35.2 g/dL (ref 30.0–36.0)
MCV: 89.2 fL (ref 78.0–100.0)
Platelets: 196 10*3/uL (ref 150–400)
RBC: 3.7 MIL/uL — ABNORMAL LOW (ref 3.87–5.11)
RDW: 12.9 % (ref 11.5–15.5)
WBC: 11.6 10*3/uL — ABNORMAL HIGH (ref 4.0–10.5)

## 2013-12-28 LAB — PROTIME-INR
INR: 1.91 — ABNORMAL HIGH (ref 0.00–1.49)
Prothrombin Time: 21.3 seconds — ABNORMAL HIGH (ref 11.6–15.2)

## 2013-12-28 LAB — COMPREHENSIVE METABOLIC PANEL
ALT: 20 U/L (ref 0–35)
AST: 19 U/L (ref 0–37)
Albumin: 3 g/dL — ABNORMAL LOW (ref 3.5–5.2)
Alkaline Phosphatase: 33 U/L — ABNORMAL LOW (ref 39–117)
BUN: 14 mg/dL (ref 6–23)
CO2: 20 mEq/L (ref 19–32)
Calcium: 7.6 mg/dL — ABNORMAL LOW (ref 8.4–10.5)
Chloride: 98 mEq/L (ref 96–112)
Creatinine, Ser: 0.52 mg/dL (ref 0.50–1.10)
GFR calc Af Amer: >90 mL/min (ref >90)
GFR calc non Af Amer: >90 mL/min (ref >90)
Glucose, Bld: 120 mg/dL — ABNORMAL HIGH (ref 70–99)
Potassium: 3.6 mEq/L — ABNORMAL LOW (ref 3.7–5.3)
Sodium: 132 mEq/L — ABNORMAL LOW (ref 137–147)
Total Bilirubin: 0.4 mg/dL (ref 0.3–1.2)
Total Protein: 5.4 g/dL — ABNORMAL LOW (ref 6.0–8.3)

## 2013-12-28 LAB — LACTIC ACID, PLASMA: Lactic Acid, Venous: 0.8 mmol/L (ref 0.5–2.2)

## 2013-12-28 LAB — PROCALCITONIN: Procalcitonin: 1.35 ng/mL

## 2013-12-28 LAB — TSH: TSH: 0.514 u[IU]/mL (ref 0.350–4.500)

## 2013-12-28 LAB — PHOSPHORUS: Phosphorus: 2.7 mg/dL (ref 2.3–4.6)

## 2013-12-28 LAB — MAGNESIUM: Magnesium: 1.6 mg/dL (ref 1.5–2.5)

## 2013-12-28 SURGERY — LAPAROTOMY, EXPLORATORY
Anesthesia: General | Site: Abdomen

## 2013-12-28 MED ORDER — FENTANYL CITRATE 0.05 MG/ML IJ SOLN
25.0000 ug | INTRAMUSCULAR | Status: DC | PRN
  Administered 2013-12-28: 25 ug via INTRAVENOUS
  Filled 2013-12-28: qty 2

## 2013-12-28 MED ORDER — GLYCOPYRROLATE 0.2 MG/ML IJ SOLN
INTRAMUSCULAR | Status: DC | PRN
  Administered 2013-12-28: .8 mg via INTRAVENOUS
  Administered 2013-12-28: 0.2 mg via INTRAVENOUS

## 2013-12-28 MED ORDER — ONDANSETRON HCL 4 MG/2ML IJ SOLN
INTRAMUSCULAR | Status: DC | PRN
  Administered 2013-12-28: 4 mg via INTRAVENOUS

## 2013-12-28 MED ORDER — ONDANSETRON HCL 4 MG PO TABS: 4.0000 mg | ORAL_TABLET | Freq: Four times a day (QID) | ORAL | Status: DC | PRN

## 2013-12-28 MED ORDER — DIPHENHYDRAMINE HCL 12.5 MG/5ML PO ELIX
12.5000 mg | ORAL_SOLUTION | Freq: Four times a day (QID) | ORAL | Status: DC | PRN
  Filled 2013-12-28: qty 5

## 2013-12-28 MED ORDER — SODIUM CHLORIDE 0.9 % IV SOLN
1.0000 g | INTRAVENOUS | Status: DC
  Administered 2013-12-28 – 2014-01-03 (×6): 1 g via INTRAVENOUS
  Filled 2013-12-28 (×7): qty 1

## 2013-12-28 MED ORDER — ONDANSETRON HCL 4 MG/2ML IJ SOLN
INTRAMUSCULAR | Status: AC
  Filled 2013-12-28: qty 2

## 2013-12-28 MED ORDER — PHENYLEPHRINE HCL 10 MG/ML IJ SOLN
INTRAMUSCULAR | Status: AC
  Filled 2013-12-28: qty 1

## 2013-12-28 MED ORDER — NEOSTIGMINE METHYLSULFATE 1 MG/ML IJ SOLN
INTRAMUSCULAR | Status: AC
  Filled 2013-12-28: qty 10

## 2013-12-28 MED ORDER — NEOSTIGMINE METHYLSULFATE 1 MG/ML IJ SOLN
INTRAMUSCULAR | Status: DC | PRN
  Administered 2013-12-28: 1 mg via INTRAVENOUS
  Administered 2013-12-28: 4 mg via INTRAVENOUS

## 2013-12-28 MED ORDER — VECURONIUM BROMIDE 10 MG IV SOLR
INTRAVENOUS | Status: DC | PRN
  Administered 2013-12-28: 1 mg via INTRAVENOUS
  Administered 2013-12-28: 3 mg via INTRAVENOUS
  Administered 2013-12-28: 5 mg via INTRAVENOUS

## 2013-12-28 MED ORDER — SODIUM CHLORIDE 0.9 % IV SOLN: 1.0000 g | INTRAVENOUS | Status: DC

## 2013-12-28 MED ORDER — HYDROMORPHONE HCL PF 1 MG/ML IJ SOLN
INTRAMUSCULAR | Status: AC
  Administered 2013-12-28: 0.5 mg via INTRAVENOUS
  Filled 2013-12-28: qty 1

## 2013-12-28 MED ORDER — DEXAMETHASONE SODIUM PHOSPHATE 4 MG/ML IJ SOLN
INTRAMUSCULAR | Status: AC
  Filled 2013-12-28: qty 1

## 2013-12-28 MED ORDER — DEXAMETHASONE SODIUM PHOSPHATE 4 MG/ML IJ SOLN
INTRAMUSCULAR | Status: DC | PRN
  Administered 2013-12-28: 4 mg via INTRAVENOUS

## 2013-12-28 MED ORDER — LACTATED RINGERS IV SOLN: INTRAVENOUS | Status: DC | PRN

## 2013-12-28 MED ORDER — SUCCINYLCHOLINE CHLORIDE 20 MG/ML IJ SOLN
INTRAMUSCULAR | Status: DC | PRN
  Administered 2013-12-28: 100 mg via INTRAVENOUS

## 2013-12-28 MED ORDER — FENTANYL CITRATE 0.05 MG/ML IJ SOLN
INTRAMUSCULAR | Status: AC
  Filled 2013-12-28: qty 5

## 2013-12-28 MED ORDER — MORPHINE SULFATE (PF) 1 MG/ML IV SOLN
INTRAVENOUS | Status: DC
  Administered 2013-12-28: 10:00:00 via INTRAVENOUS
  Administered 2013-12-28: 6 mg via INTRAVENOUS
  Administered 2013-12-28: 10.5 mg via INTRAVENOUS
  Administered 2013-12-28: 6 mg via INTRAVENOUS
  Administered 2013-12-28: 14.61 mg via INTRAVENOUS
  Administered 2013-12-29: 2.55 mg via INTRAVENOUS
  Administered 2013-12-29 (×2): 7.5 mg via INTRAVENOUS
  Filled 2013-12-28 (×3): qty 25

## 2013-12-28 MED ORDER — FENTANYL CITRATE 0.05 MG/ML IJ SOLN
INTRAMUSCULAR | Status: DC | PRN
  Administered 2013-12-28 (×2): 150 ug via INTRAVENOUS
  Administered 2013-12-28: 50 ug via INTRAVENOUS
  Administered 2013-12-28: 100 ug via INTRAVENOUS
  Administered 2013-12-28: 50 ug via INTRAVENOUS

## 2013-12-28 MED ORDER — ACETAMINOPHEN 325 MG PO TABS: 650.0000 mg | ORAL_TABLET | Freq: Four times a day (QID) | ORAL | Status: DC | PRN

## 2013-12-28 MED ORDER — ARTIFICIAL TEARS OP OINT
TOPICAL_OINTMENT | OPHTHALMIC | Status: DC | PRN
  Administered 2013-12-28: 1 via OPHTHALMIC

## 2013-12-28 MED ORDER — PROPOFOL 10 MG/ML IV BOLUS
INTRAVENOUS | Status: DC | PRN
  Administered 2013-12-28: 100 mg via INTRAVENOUS

## 2013-12-28 MED ORDER — 0.9 % SODIUM CHLORIDE (POUR BTL) OPTIME
TOPICAL | Status: DC | PRN
  Administered 2013-12-28 (×6): 1000 mL

## 2013-12-28 MED ORDER — DIPHENHYDRAMINE HCL 50 MG/ML IJ SOLN: 12.5000 mg | Freq: Four times a day (QID) | INTRAMUSCULAR | Status: DC | PRN

## 2013-12-28 MED ORDER — PROPOFOL 10 MG/ML IV BOLUS
INTRAVENOUS | Status: AC
  Filled 2013-12-28: qty 20

## 2013-12-28 MED ORDER — ENOXAPARIN SODIUM 40 MG/0.4ML ~~LOC~~ SOLN
40.0000 mg | SUBCUTANEOUS | Status: DC
  Administered 2013-12-29 – 2014-01-01 (×4): 40 mg via SUBCUTANEOUS
  Filled 2013-12-28 (×6): qty 0.4

## 2013-12-28 MED ORDER — HYDROCODONE-ACETAMINOPHEN 5-325 MG PO TABS: 1.0000 | ORAL_TABLET | ORAL | Status: DC | PRN

## 2013-12-28 MED ORDER — MIDAZOLAM HCL 2 MG/2ML IJ SOLN: 0.5000 mg | Freq: Once | INTRAMUSCULAR | Status: DC | PRN

## 2013-12-28 MED ORDER — ONDANSETRON HCL 4 MG/2ML IJ SOLN: 4.0000 mg | Freq: Four times a day (QID) | INTRAMUSCULAR | Status: DC | PRN

## 2013-12-28 MED ORDER — SODIUM CHLORIDE 0.9 % IV BOLUS (SEPSIS)
1000.0000 mL | Freq: Once | INTRAVENOUS | Status: AC
  Administered 2013-12-28: 1000 mL via INTRAVENOUS

## 2013-12-28 MED ORDER — MEPERIDINE HCL 25 MG/ML IJ SOLN: 6.2500 mg | INTRAMUSCULAR | Status: DC | PRN

## 2013-12-28 MED ORDER — HYDROMORPHONE HCL PF 1 MG/ML IJ SOLN
0.2500 mg | INTRAMUSCULAR | Status: DC | PRN
  Administered 2013-12-28 (×4): 0.5 mg via INTRAVENOUS

## 2013-12-28 MED ORDER — NALOXONE HCL 0.4 MG/ML IJ SOLN: 0.4000 mg | INTRAMUSCULAR | Status: DC | PRN

## 2013-12-28 MED ORDER — ARTIFICIAL TEARS OP OINT
TOPICAL_OINTMENT | OPHTHALMIC | Status: AC
  Filled 2013-12-28: qty 3.5

## 2013-12-28 MED ORDER — SODIUM CHLORIDE 0.9 % IV SOLN
INTRAVENOUS | Status: DC
  Administered 2013-12-28 (×2): via INTRAVENOUS

## 2013-12-28 MED ORDER — DOCUSATE SODIUM 100 MG PO CAPS: 100.0000 mg | ORAL_CAPSULE | Freq: Two times a day (BID) | ORAL | Status: DC

## 2013-12-28 MED ORDER — SODIUM CHLORIDE 0.9 % IJ SOLN
3.0000 mL | Freq: Two times a day (BID) | INTRAMUSCULAR | Status: DC
  Administered 2013-12-28 – 2014-01-02 (×4): 3 mL via INTRAVENOUS

## 2013-12-28 MED ORDER — ACETAMINOPHEN 650 MG RE SUPP: 650.0000 mg | Freq: Four times a day (QID) | RECTAL | Status: DC | PRN

## 2013-12-28 MED ORDER — PHENYLEPHRINE HCL 10 MG/ML IJ SOLN
10.0000 mg | INTRAMUSCULAR | Status: DC | PRN
  Administered 2013-12-28: 15 ug/min via INTRAVENOUS

## 2013-12-28 MED ORDER — SUCCINYLCHOLINE CHLORIDE 20 MG/ML IJ SOLN
INTRAMUSCULAR | Status: AC
  Filled 2013-12-28: qty 1

## 2013-12-28 MED ORDER — HYDROMORPHONE HCL PF 1 MG/ML IJ SOLN
0.5000 mg | Freq: Once | INTRAMUSCULAR | Status: AC
  Administered 2013-12-28: 0.5 mg via INTRAVENOUS

## 2013-12-28 MED ORDER — SODIUM CHLORIDE 0.9 % IJ SOLN: 9.0000 mL | INTRAMUSCULAR | Status: DC | PRN

## 2013-12-28 MED ORDER — OXYCODONE HCL 5 MG PO TABS: 5.0000 mg | ORAL_TABLET | Freq: Once | ORAL | Status: DC | PRN

## 2013-12-28 MED ORDER — GLYCOPYRROLATE 0.2 MG/ML IJ SOLN
INTRAMUSCULAR | Status: AC
  Filled 2013-12-28: qty 3

## 2013-12-28 MED ORDER — MIDAZOLAM HCL 5 MG/5ML IJ SOLN
INTRAMUSCULAR | Status: DC | PRN
  Administered 2013-12-28: 1 mg via INTRAVENOUS

## 2013-12-28 MED ORDER — OXYCODONE HCL 5 MG/5ML PO SOLN: 5.0000 mg | Freq: Once | ORAL | Status: DC | PRN

## 2013-12-28 MED ORDER — LIDOCAINE HCL (CARDIAC) 20 MG/ML IV SOLN
INTRAVENOUS | Status: AC
  Filled 2013-12-28: qty 5

## 2013-12-28 MED ORDER — PIPERACILLIN-TAZOBACTAM 3.375 G IVPB: 3.3750 g | Freq: Three times a day (TID) | INTRAVENOUS | Status: DC

## 2013-12-28 MED ORDER — MIDAZOLAM HCL 2 MG/2ML IJ SOLN
INTRAMUSCULAR | Status: AC
  Filled 2013-12-28: qty 2

## 2013-12-28 SURGICAL SUPPLY — 58 items
BLADE SURG ROTATE 9660 (MISCELLANEOUS) IMPLANT
CANISTER SUCTION 2500CC (MISCELLANEOUS) ×6 IMPLANT
CHLORAPREP W/TINT 26ML (MISCELLANEOUS) ×2 IMPLANT
CLAMP POUCH DRAINAGE QUIET (OSTOMY) ×2 IMPLANT
COVER MAYO STAND STRL (DRAPES) ×2 IMPLANT
COVER SURGICAL LIGHT HANDLE (MISCELLANEOUS) ×4 IMPLANT
DRAPE LAPAROSCOPIC ABDOMINAL (DRAPES) ×2 IMPLANT
DRAPE PROXIMA HALF (DRAPES) ×4 IMPLANT
DRAPE UTILITY 15X26 W/TAPE STR (DRAPE) ×10 IMPLANT
DRAPE WARM FLUID 44X44 (DRAPE) ×2 IMPLANT
DRSG OPSITE POSTOP 4X10 (GAUZE/BANDAGES/DRESSINGS) IMPLANT
DRSG OPSITE POSTOP 4X8 (GAUZE/BANDAGES/DRESSINGS) IMPLANT
ELECT BLADE 6.5 EXT (BLADE) ×2 IMPLANT
ELECT CAUTERY BLADE 6.4 (BLADE) ×2 IMPLANT
ELECT REM PT RETURN 9FT ADLT (ELECTROSURGICAL) ×2
ELECTRODE REM PT RTRN 9FT ADLT (ELECTROSURGICAL) ×1 IMPLANT
GLOVE BIOGEL M 8.0 STRL (GLOVE) IMPLANT
GLOVE BIOGEL M STRL SZ7.5 (GLOVE) ×2 IMPLANT
GLOVE BIOGEL PI IND STRL 8 (GLOVE) ×1 IMPLANT
GLOVE BIOGEL PI INDICATOR 8 (GLOVE) ×1
GOWN STRL NON-REIN LRG LVL3 (GOWN DISPOSABLE) ×8 IMPLANT
GOWN STRL REIN XL XLG (GOWN DISPOSABLE) ×4 IMPLANT
KIT BASIN OR (CUSTOM PROCEDURE TRAY) ×2 IMPLANT
KIT ROOM TURNOVER OR (KITS) ×2 IMPLANT
LEGGING LITHOTOMY PAIR STRL (DRAPES) IMPLANT
LIGASURE IMPACT 36 18CM CVD LR (INSTRUMENTS) ×2 IMPLANT
NS IRRIG 1000ML POUR BTL (IV SOLUTION) ×12 IMPLANT
PACK GENERAL/GYN (CUSTOM PROCEDURE TRAY) ×2 IMPLANT
PAD ARMBOARD 7.5X6 YLW CONV (MISCELLANEOUS) ×4 IMPLANT
PENCIL BUTTON HOLSTER BLD 10FT (ELECTRODE) IMPLANT
POUCH OSTOMY 2  H (OSTOMY) ×2 IMPLANT
SEPRAFILM PROCEDURAL PACK 3X5 (MISCELLANEOUS) ×2 IMPLANT
SPECIMEN JAR LARGE (MISCELLANEOUS) IMPLANT
SPECIMEN JAR X LARGE (MISCELLANEOUS) ×2 IMPLANT
SPONGE GAUZE 4X4 12PLY (GAUZE/BANDAGES/DRESSINGS) ×2 IMPLANT
SPONGE LAP 18X18 X RAY DECT (DISPOSABLE) ×6 IMPLANT
STAPLER CUT CVD 40MM BLUE (STAPLE) ×2 IMPLANT
STAPLER CUT RELOAD BLUE (STAPLE) ×2 IMPLANT
STAPLER PROXIMATE 75MM BLUE (STAPLE) ×2 IMPLANT
STAPLER VISISTAT 35W (STAPLE) ×2 IMPLANT
SUCTION POOLE TIP (SUCTIONS) ×2 IMPLANT
SURGILUBE 2OZ TUBE FLIPTOP (MISCELLANEOUS) IMPLANT
SUT PDS AB 1 TP1 96 (SUTURE) ×4 IMPLANT
SUT PROLENE 2 0 CT2 30 (SUTURE) ×2 IMPLANT
SUT PROLENE 2 0 KS (SUTURE) IMPLANT
SUT SILK 2 0 SH CR/8 (SUTURE) ×2 IMPLANT
SUT SILK 2 0 TIES 10X30 (SUTURE) ×2 IMPLANT
SUT SILK 3 0 SH CR/8 (SUTURE) ×2 IMPLANT
SUT SILK 3 0 TIES 10X30 (SUTURE) ×2 IMPLANT
SUT VIC AB 3-0 SH 18 (SUTURE) ×4 IMPLANT
SYR BULB IRRIGATION 50ML (SYRINGE) IMPLANT
TOWEL OR 17X26 10 PK STRL BLUE (TOWEL DISPOSABLE) ×2 IMPLANT
TRAY FOLEY CATH 14FRSI W/METER (CATHETERS) IMPLANT
TRAY FOLEY CATH 16FRSI W/METER (SET/KITS/TRAYS/PACK) ×2 IMPLANT
TRAY PROCTOSCOPIC FIBER OPTIC (SET/KITS/TRAYS/PACK) IMPLANT
TUBE CONNECTING 12X1/4 (SUCTIONS) ×2 IMPLANT
WATER STERILE IRR 1000ML POUR (IV SOLUTION) IMPLANT
YANKAUER SUCT BULB TIP NO VENT (SUCTIONS) IMPLANT
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
## 2013-12-28 NOTE — Op Note (Signed)
[name], [name] NO.:  [id number]  MEDICAL RECORD NO.:  [id number]  LOCATION:  3S05C                        FACILITY:  [facility]  PHYSICIAN:  [name]. [name], MD, FACSDATE OF BIRTH:  [birth year]  DATE OF PROCEDURE:  12/28/2013 DATE OF DISCHARGE:                              OPERATIVE REPORT   PREOPERATIVE DIAGNOSIS:  Perforated sigmoid diverticulitis with peritonitis.  PROCEDURE: 1. Exploratory laparotomy. 2. Hartmann procedure with splenic flexure mobilization.  SURGEON:  [name]. [name], MD, FACS  ASSISTANT SURGEON:  [name], MD  ANESTHESIA:  General.  EBL:  About 150 mL.  FLUIDS:  2 units of FFP.  SPECIMEN: 1. Sigmoid colon stitch marks distal margin. 2. Additional proximal descending colon.  FINDINGS:  There was purulent fluid within the abdomen.  The patient had a focal perforation of her sigmoid colon in the left lower quadrant. The defect in the colon was a little over a centimeter and half.  The small bowel mesentery had apparently gravitated over to try to wall off. The patient had a redundant transverse colon.  However, her splenic flexure was very high up requiring [place] to extend the incision and requiring [place] to mobilize the splenic flexure in order to bring out an end colostomy.  INDICATIONS FOR PROCEDURE:  The patient is a 68 year old female who was admitted at the end of the month with sigmoid diverticulitis with a small perforation which was medically managed.  She got better on bowel rest, and Cipro and Flagyl and ultimately tolerated a diet.  She was actually discharged to home.  She states that she just really has not felt normal since being discharged.  She still has fatigue.  She has not been able to normally walk 2 miles per day  that she normally does.  She had recurrence of severe lower abdominal pain on Saturday prompting her to come back to the emergency room via EMS because her daughter states that the patient  initially got very lightheaded and almost passed out. In the ER, she was found to have a mild leukocytosis.  A CT scan was performed, which demonstrated again sigmoid diverticulitis with associated area of small bowel wall thickening, a small area of free air around the sigmoid diverticulitis.  Initially, she was tender and did have some guarding, however, she did not have peritonitis and looked uncomfortable.  I recommended admission with bowel rest and IV antibiotics with a probable same hospitalization colectomy, although at that time, I did not think it needs to be done emergently.  An hour or so later, the hospice called me stating that the patient stated her abdominal exam and her pain had worsened.  They came back down, the patient was motionless, she was moaning in pain, and she had severe tenderness to palpation in the left lower quadrant consisting with rebound tenderness and peritonitis.  At this point, she also spiked a low-grade fever.  At this point, I recommended proceeding to the operating room for exploration.  We discussed the risks and benefits of surgery including, but not limited to, bleeding, infection, injury to surrounding structures, incisional hernia, parastomal hernia, ostomy complications, blood clot formation, postoperatively  AFib, cardiac and pulmonary  complications, need for additional procedures, prolonged hospitalization, ileus and intraabdominal abscess.  The patient and her daughter elected for me to proceed to the operating room.  DESCRIPTION OF PROCEDURE:  After obtaining informed consent, the patient was taken to the operating room to the [facility] and placed supine on the operating table.  General endotracheal anesthesia was established.  Her abdomen was prepped and draped in usual standard surgical fashion.  She received Invanz already.  Surgical time-out was performed.  A lower midline incision was made from her umbilicus down to her  pubic bone with a 10 blade.  Subcutaneous tissue was divided with electrocautery.  The fascia was incised with Bovie electrocautery.  The abdomen was entered.  There was purulent drainage in her abdomen.  A Balfour retractor was placed initially.  The small bowel was reflected up out of the pelvis.  The sigmoid colon was identified.  As stated above, there was a focal area of perforation in the sigmoid colon in the left lower quadrant. the defect was around 1.5 cm.  There had been evidence of where a segment of small bowel mesentery had apparently gravitated over and tried to wall it off.  There was some fibrinous exudate on that portion of the small bowel.  The remaining portion of the sigmoid and upper rectum and descending colon appeared normal.  There was obviously some scattered diverticular disease with no active diverticulitis in focal area.  I decided to come across the upper rectum at the level just below the sacral promontory incising the peritoneum both medially and laterally.  I divided the upper rectum with a contour stapler.  I then started taking down the mesocolon back proximally hugging the colon wall with a LigaSure device.  I then divided the colon with a GIA 75 stapler with a blue load.  The remaining mesentery was taken down, and the specimen was passed off the field.  At this point, I started to mobilize the proximal colon.  The lateral attachments were taken down in both blunt fashion as well as with electrocautery.  The patient had a very droopy transverse colon.  However, it arched back in part back up into her left upper quadrant and was quite tethered.  We ended up having to extend the excision in her upper midline in order to gain visualization which was quite difficult.  After extending the incision, visualization was much improved.  The gastrocolic ligament was taken down and going back anteriorly as well as laterally.  I was able to eventually take down the  splenic colic ligament and the surrounding attachments with electrocautery.  I was able to medialize the splenic flexure to the midline.  I placed two 2-0 Prolene on each end of the rectal stump.  I then tacked the rectal stump up to the left pelvic sidewall.  At this point, I continued to mobilized some of the proximal colon in order to achieve enough length to bring it up through the abdominal wall.  A circular defect was made in the skin and the left upper abdomen with electrocautery.  Subcutaneous tissue was spread.  A cruciate incision was made in the fascia.  The rectus muscle was spread and the peritoneum was incised and I was able to pass 2 fingers easily through the defect.  The ends of the proximal colon was brought out, ensuring that it was not twisted on its mesentery.  The distal 3 inches of the colon that had been exteriorized initially looked normally.  We then copiously irrigated the abdomen.  There was no evidence of ongoing bleeding.  The midline was reapproximated with two  #1 looped PDS, 1 from above and 1 from below.  At this point, we started to mature the colostomy.  However, there was a distal segment of the colostomy that was not the picture of health.  Therefore, we resected the distal part of the end colostomy with electrocautery.  We then matured the ostomy. A 3-0 Vicryl sutures were used to [place] the colostomy in the 4 quadrants.  We then placed interrupted 3-0 Vicryl to the evert and create a rosebud ostomy.  An ostomy appliance was placed.  Moist dressings were placed in the open midline incision followed by dry dressings.  The patient was extubated and brought to the recovery room in stable addition.  All needle, instrument, sponge counts were correct x2.  There were no immediate complications.  The patient tolerated the procedure well.     [name]. [name], MD, FACS     EMW/MEDQ  D:  12/28/2013  T:  12/28/2013  Job:  [id number]

## 2013-12-28 NOTE — Progress Notes (Signed)
Subjective: Called by TRH b/c on exam seems like pt more tender; pt reports more pain,   Objective: Vital signs in last 24 hours: Temp:  [98 F (36.7 C)-100.3 F (37.9 C)] 100.3 F (37.9 C) (02/15 0134) Pulse Rate:  [75-84] 75 (02/15 0100) Resp:  [17-23] 23 (02/15 0236) BP: (101-154)/(43-62) 111/53 mmHg (02/15 0236) SpO2:  [93 %-99 %] 96 % (02/15 0236) Weight:  [148 lb (67.132 kg)] 148 lb (67.132 kg) (02/14 1951)    Intake/Output from previous day: 02/14 0701 - 02/15 0700 In: 3 [I.V.:3] Out: -  Intake/Output this shift: Total I/O In: 3 [I.V.:3] Out: -   Laying motionless in bed. Moaning in pain. Appears uncomfortable Soft, generalized peritonitis, most severe LLQ; +RT  Lab Results:   Recent Labs  12/27/13 2024 12/28/13 0201  WBC 12.2* 11.6*  HGB 13.9 11.6*  HCT 39.4 33.0*  PLT 245 196   BMET  Recent Labs  12/27/13 2024  NA 134*  K 4.5  CL 96  CO2 23  GLUCOSE 96  BUN 19  CREATININE 0.70  CALCIUM 8.9   PT/INR  Recent Labs  12/27/13 2349  LABPROT 21.3*  INR 1.91*   ABG No results found for this basename: PHART, PCO2, PO2, HCO3,  in the last 72 hours  Studies/Results: Ct Abdomen Pelvis Wo Contrast  12/27/2013   CLINICAL DATA:  Low abdominal pain, nausea, constipation  EXAM: CT ABDOMEN AND PELVIS WITHOUT CONTRAST  TECHNIQUE: Multidetector CT imaging of the abdomen and pelvis was performed following the standard protocol without intravenous contrast.  COMPARISON:  Recent prior CT abdomen/ pelvis 12/03/2013  FINDINGS: Lower Chest: The lung bases are clear. Visualized cardiac structures are within normal limits for size. No pericardial effusion. Small hiatal hernia. Atherosclerotic calcification noted in the visualized coronary arteries.  Abdomen: Unenhanced CT was performed per clinician order. Lack of IV contrast limits sensitivity and specificity, especially for evaluation of abdominal/pelvic solid viscera. Within these limitations, unremarkable CT  appearance of the stomach, duodenum, spleen, adrenal glands, pancreas. Stable 1 cm circumscribed hypo attenuating focus in hepatic segment 2/3. No new focal hepatic lesion. Punctate calcification in the right hepatic lobe suggest sequelae of old granulomatous disease. Gallbladder is unremarkable. No intra or extrahepatic biliary ductal dilatation.  Unremarkable appearance of the bilateral kidneys. No focal solid lesion, hydronephrosis or nephrolithiasis.  Colonic diverticular disease. Interstitial inflammatory stranding is noted about the proximal sigmoid colon and an adjacent loop of distal ileum. There is a small amount of extraluminal air position between the loop of small bowel and the colon. No definite free fluid or abscess collection. The inflammatory change appears to be centered around the sigmoid colon. The small bowel changes are likely reactive. The remainder of the colon is unremarkable. No bowel obstruction.  Pelvis: Heterogeneous uterus with internal calcifications likely reflecting degenerated fibroids. No free fluid or suspicious adenopathy.  Bones/Soft Tissues: No acute fracture or aggressive appearing lytic or blastic osseous lesion.  Vascular: Atherosclerotic vascular disease without significant stenosis or aneurysmal dilatation.  IMPRESSION: 1. Acute sigmoid diverticulitis complicated by small contained perforation and secondary inflammatory changes involving an adjacent loop of ileum. No drainable fluid collection or abscess identified. 2. Atherosclerosis including coronary artery disease. 3. Hiatal hernia. These results were called by telephone at the time of interpretation on 12/27/2013 at 11:04 PM to Dr. Carmin Muskrat , who verbally acknowledged these results.   Electronically Signed   By: Jacqulynn Cadet M.D.   On: 12/27/2013 23:05    Anti-infectives: Anti-infectives  Start     Dose/Rate Route Frequency Ordered Stop   12/28/13 0200  ertapenem (INVANZ) 1 g in sodium chloride 0.9 %  50 mL IVPB     1 g 100 mL/hr over 30 Minutes Intravenous Every 24 hours 12/28/13 0150     12/28/13 0145  piperacillin-tazobactam (ZOSYN) IVPB 3.375 g  Status:  Discontinued     3.375 g 12.5 mL/hr over 240 Minutes Intravenous 3 times per day 12/28/13 0142 12/28/13 0150   12/27/13 2330  ertapenem (INVANZ) 1 g in sodium chloride 0.9 % 50 mL IVPB     1 g 100 mL/hr over 30 Minutes Intravenous  Once 12/27/13 2327 12/28/13 0049   12/27/13 2315  ciprofloxacin (CIPRO) IVPB 400 mg  Status:  Discontinued     400 mg 200 mL/hr over 60 Minutes Intravenous  Once 12/27/13 2307 12/27/13 2327   12/27/13 2315  metroNIDAZOLE (FLAGYL) IVPB 500 mg  Status:  Discontinued     500 mg 100 mL/hr over 60 Minutes Intravenous  Once 12/27/13 2307 12/27/13 2327      Assessment/Plan: Complicated sigmoid diverticulitis with perf  Pt now has low grade temp, has more pain, and now has peritonitis. rec to pt and daughter that at this point given worsening physical exam that urgent exp lap, colon resection, ostomy needed.   I discussed the procedure in detail.   We discussed the risks and benefits of surgery including, but not limited to bleeding, infection (such as wound infection, abdominal abscess), injury to surrounding structures, blood clot formation, urinary retention, incisional hernia, ostomy complications such as hernia/infarct,  anesthesia risks, pulmonary & cardiac complications such as pneumonia &/or heart attack, need for additional procedures, ileus, & prolonged hospitalization. i discussed that she could get sicker before she gets better.  We discussed the typical postoperative recovery course, including limitations & restrictions postoperatively. I explained that the likelihood of improvement in their symptoms is good.  Transfuse 2u FFP On IV abx [name]. [name], MD, FACS General, Bariatric, & Minimally Invasive Surgery [facility], [place]   LOS: 1 day    Gayland Curry 12/28/2013

## 2013-12-28 NOTE — ED Notes (Signed)
Admitting physician at bedside

## 2013-12-28 NOTE — Transfer of Care (Signed)
Immediate Anesthesia Transfer of Care Note  Patient: [name]  Procedure(s) Performed: Procedure(s) with comments: EXPLORATORY LAPAROTOMY (N/A) - Hartman's procedure with splenic flexure mobilization COLON RESECTION SIGMOID (N/A) COLOSTOMY (N/A)  Patient Location: PACU  Anesthesia Type:General  Level of Consciousness: sedated  Airway & Oxygen Therapy: Patient Spontanous Breathing and Patient connected to nasal cannula oxygen  Post-op Assessment: Report given to PACU RN and Post -op Vital signs reviewed and stable  Post vital signs: Reviewed and stable  Complications: No apparent anesthesia complications

## 2013-12-28 NOTE — Progress Notes (Signed)
Gen. Surgery:  Patient is alert. Appropriately sore. Vital signs are stable.Urine is clear. No respiratory problems. Abdomen is appropriately tender but soft. Midline wound is clean and dry. Colostomy is pink and viable. No bleeding.  Assessment: Stable  [name]. [name], M.D., [facility] General and Minimally invasive Surgery  Office:   [phone]

## 2013-12-28 NOTE — Anesthesia Postprocedure Evaluation (Signed)
Anesthesia Post Note  Patient: [name]  Procedure(s) Performed: Procedure(s) (LRB): EXPLORATORY LAPAROTOMY (N/A) COLON RESECTION SIGMOID (N/A) COLOSTOMY (N/A)  Anesthesia type: general  Patient location: PACU  Post pain: Pain level controlled  Post assessment: Patient's Cardiovascular Status Stable  Last Vitals:  Filed Vitals:   12/28/13 0833  BP: 120/55  Pulse: 78  Temp:   Resp: 9    Post vital signs: Reviewed and stable  Level of consciousness: sedated  Complications: No apparent anesthesia complications

## 2013-12-28 NOTE — Brief Op Note (Signed)
12/27/2013 - 12/28/2013  7:43 AM  PATIENT:  [name]  68 y.o. female  PRE-OPERATIVE DIAGNOSIS:  perforated sigmoid diverticulitis with peritonoits  POST-OPERATIVE DIAGNOSIS:  same  PROCEDURE:  Procedure(s) with comments: EXPLORATORY LAPAROTOMY (N/A) - Hartman's procedure with splenic flexure mobilization COLON RESECTION SIGMOID (N/A) COLOSTOMY (N/A)  SURGEON:  Surgeon(s) and Role:    * Gayland Curry, MD - Primary    * [name], MD - Assisting  PHYSICIAN ASSISTANT: none  ASSISTANTS: see above   ANESTHESIA:   general  EBL:     BLOOD ADMINISTERED:2u FFP  DRAINS: Urinary Catheter (Foley)   LOCAL MEDICATIONS USED:  NONE  SPECIMEN:  Source of Specimen:  sigmoid colon, stitch distal margin; additional prox descending colon  DISPOSITION OF SPECIMEN:  PATHOLOGY  COUNTS:  YES  TOURNIQUET:  * No tourniquets in log *  DICTATION: .Other Dictation: Dictation Number [id number]  PLAN OF CARE: Admit to inpatient   PATIENT DISPOSITION:  PACU - hemodynamically stable.   Delay start of Pharmacological VTE agent (>24hrs) due to surgical blood loss or risk of bleeding: no  [name]. [name], MD, FACS General, Bariatric, & Minimally Invasive Surgery [facility], [place]
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
## 2013-12-28 NOTE — Progress Notes (Signed)
Patient database reviewed. Admitted earlier today for LLQ abdominal pain and found to have diverticulitis with microperforation. Abdominal pain progressed overnight and was subsequently taken to the OR. Will continue to follow closely.  Domingo Mend, MD [facility] Pager: [phone]

## 2013-12-28 NOTE — Preoperative (Signed)
Beta Blockers   Reason not to administer Beta Blockers:Metoprolol 25 mg po @0900hrs  on 12/27/2013
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
## 2013-12-28 NOTE — Anesthesia Procedure Notes (Signed)
Procedure Name: Intubation Date/Time: 12/28/2013 4:46 AM Performed by: Jacquiline Doe A Pre-anesthesia Checklist: Emergency Drugs available, Patient identified, Timeout performed, Suction available and Patient being monitored Patient Re-evaluated:Patient Re-evaluated prior to inductionOxygen Delivery Method: Circle system utilized Preoxygenation: Pre-oxygenation with 100% oxygen Intubation Type: IV induction, Rapid sequence and Cricoid Pressure applied Laryngoscope Size: Mac and 3 Grade View: Grade I Tube type: Oral Tube size: 7.5 mm Number of attempts: 1 Airway Equipment and Method: Stylet Placement Confirmation: ETT inserted through vocal cords under direct vision,  breath sounds checked- equal and bilateral and positive ETCO2 Secured at: 22 cm Tube secured with: Tape Dental Injury: Teeth and Oropharynx as per pre-operative assessment

## 2013-12-29 ENCOUNTER — Telehealth: Payer: Self-pay | Admitting: *Deleted

## 2013-12-29 LAB — BASIC METABOLIC PANEL
BUN: 6 mg/dL (ref 6–23)
CO2: 23 mEq/L (ref 19–32)
Calcium: 7.3 mg/dL — ABNORMAL LOW (ref 8.4–10.5)
Chloride: 103 mEq/L (ref 96–112)
Creatinine, Ser: 0.52 mg/dL (ref 0.50–1.10)
GFR calc Af Amer: >90 mL/min (ref >90)
GFR calc non Af Amer: >90 mL/min (ref >90)
Glucose, Bld: 88 mg/dL (ref 70–99)
Potassium: 3.7 mEq/L (ref 3.7–5.3)
Sodium: 137 mEq/L (ref 137–147)

## 2013-12-29 LAB — PREPARE FRESH FROZEN PLASMA
Unit division: 0
Unit division: 0

## 2013-12-29 LAB — CBC
HCT: 33 % — ABNORMAL LOW (ref 36.0–46.0)
Hemoglobin: 11.4 g/dL — ABNORMAL LOW (ref 12.0–15.0)
MCH: 31.9 pg (ref 26.0–34.0)
MCHC: 34.5 g/dL (ref 30.0–36.0)
MCV: 92.4 fL (ref 78.0–100.0)
Platelets: 190 10*3/uL (ref 150–400)
RBC: 3.57 MIL/uL — ABNORMAL LOW (ref 3.87–5.11)
RDW: 13.8 % (ref 11.5–15.5)
WBC: 6.7 10*3/uL (ref 4.0–10.5)

## 2013-12-29 LAB — PHOSPHORUS: Phosphorus: 1.8 mg/dL — ABNORMAL LOW (ref 2.3–4.6)

## 2013-12-29 LAB — MAGNESIUM: Magnesium: 2 mg/dL (ref 1.5–2.5)

## 2013-12-29 LAB — PROTIME-INR
INR: 2.06 — ABNORMAL HIGH (ref 0.00–1.49)
Prothrombin Time: 22.6 seconds — ABNORMAL HIGH (ref 11.6–15.2)

## 2013-12-29 LAB — BLOOD PRODUCT ORDER (VERBAL) VERIFICATION

## 2013-12-29 LAB — APTT: aPTT: 45 seconds — ABNORMAL HIGH (ref 24–37)

## 2013-12-29 MED ORDER — DEXTROSE-NACL 5-0.9 % IV SOLN
INTRAVENOUS | Status: DC
  Administered 2013-12-29 – 2013-12-31 (×2): via INTRAVENOUS

## 2013-12-29 MED ORDER — SODIUM CHLORIDE 0.9 % IJ SOLN: 9.0000 mL | INTRAMUSCULAR | Status: DC | PRN

## 2013-12-29 MED ORDER — POTASSIUM PHOSPHATE DIBASIC 3 MMOLE/ML IV SOLN
20.0000 meq | Freq: Once | INTRAVENOUS | Status: AC
  Administered 2013-12-29: 20 meq via INTRAVENOUS
  Filled 2013-12-29: qty 4.55

## 2013-12-29 MED ORDER — NALOXONE HCL 0.4 MG/ML IJ SOLN: 0.4000 mg | INTRAMUSCULAR | Status: DC | PRN

## 2013-12-29 MED ORDER — DIPHENHYDRAMINE HCL 50 MG/ML IJ SOLN: 12.5000 mg | Freq: Four times a day (QID) | INTRAMUSCULAR | Status: DC | PRN

## 2013-12-29 MED ORDER — HYDROMORPHONE 0.3 MG/ML IV SOLN
INTRAVENOUS | Status: DC
  Administered 2013-12-29: 11:00:00 via INTRAVENOUS
  Administered 2013-12-29: 0.2 mg via INTRAVENOUS
  Administered 2013-12-29: 0.79 mg via INTRAVENOUS
  Administered 2013-12-29 – 2013-12-30 (×2): 1.19 mg via INTRAVENOUS
  Administered 2013-12-30: 1.39 mg via INTRAVENOUS
  Administered 2013-12-30: 1.99 mg via INTRAVENOUS
  Administered 2013-12-30: 0.599 mg via INTRAVENOUS
  Administered 2013-12-30: 0.999 mg via INTRAVENOUS
  Administered 2013-12-30: 11:00:00 via INTRAVENOUS
  Administered 2013-12-31: 1.39 mg via INTRAVENOUS
  Administered 2013-12-31: 0.79 mg via INTRAVENOUS
  Administered 2013-12-31: 1.19 mg via INTRAVENOUS
  Administered 2013-12-31: 0.99 mg via INTRAVENOUS
  Administered 2013-12-31: 1.1 mg via INTRAVENOUS
  Administered 2013-12-31: 1.5 mg via INTRAVENOUS
  Administered 2014-01-01: 0.17 mg via INTRAVENOUS
  Filled 2013-12-29 (×3): qty 25

## 2013-12-29 MED ORDER — SODIUM CHLORIDE 0.9 % IV SOLN: INTRAVENOUS | Status: DC

## 2013-12-29 MED ORDER — DIPHENHYDRAMINE HCL 12.5 MG/5ML PO ELIX
12.5000 mg | ORAL_SOLUTION | Freq: Four times a day (QID) | ORAL | Status: DC | PRN
  Filled 2013-12-29: qty 5

## 2013-12-29 NOTE — Progress Notes (Signed)
Utilization review completed.  

## 2013-12-29 NOTE — Progress Notes (Signed)
Patient ID: [name], female   DOB: 11/22/1945, 68 y.o.   MRN: [id number]   Subjective: Pt sitting up in a chair.  VSS.  Mild anemia.  Afebrile.  Normal white count.    Objective:  Vital signs:  Filed Vitals:   12/29/13 0400 12/29/13 0405 12/29/13 0740 12/29/13 0755  BP:  126/47 130/56 130/56  Pulse:  90 80 92  Temp:  98.1 F (36.7 C)  98.3 F (36.8 C)  TempSrc:  Oral  Oral  Resp: $Remo'12 23 23 21  'zQbwo$ Height:      Weight:      SpO2:  92% 99% 97%       Intake/Output   Yesterday:  02/15 0701 - 02/16 0700 In: 1625 [I.V.:1625] Out: 2150 [Urine:2150] This shift:  Total I/O In: 250 [I.V.:250] Out: 275 [Urine:275]   Physical Exam: General: Pt awake/alert/oriented x3 in no acute distress Abdomen: Soft.  Non distended.  No bowel sounds.  Ostomy with serous output no stool.  Midline incision is beefy red, moderate amount of serosanguinous output.     Problem List:   Active Problems:   Paroxysmal atrial fibrillation, maintaining SR on coumadin    Long term (current) use of anticoagulants   CAD (coronary artery disease) with CABG   HTN (hypertension)   Diverticulitis with perforation   Sepsis   Perforated diverticulitis    Results:   Labs: Results for orders placed during the hospital encounter of 12/27/13 (from the past 32 hour(s))  CBC WITH DIFFERENTIAL     Status: Abnormal   Collection Time    12/27/13  8:24 PM      Result Value Ref Range   WBC 12.2 (*) 4.0 - 10.5 K/uL   RBC 4.37  3.87 - 5.11 MIL/uL   Hemoglobin 13.9  12.0 - 15.0 g/dL   HCT 39.4  36.0 - 46.0 %   MCV 90.2  78.0 - 100.0 fL   MCH 31.8  26.0 - 34.0 pg   MCHC 35.3  30.0 - 36.0 g/dL   RDW 12.9  11.5 - 15.5 %   Platelets 245  150 - 400 K/uL   Neutrophils Relative % 79 (*) 43 - 77 %   Neutro Abs 9.6 (*) 1.7 - 7.7 K/uL   Lymphocytes Relative 15  12 - 46 %   Lymphs Abs 1.9  0.7 - 4.0 K/uL   Monocytes Relative 6  3 - 12 %   Monocytes Absolute 0.7  0.1 - 1.0 K/uL   Eosinophils Relative 0  0 - 5  %   Eosinophils Absolute 0.0  0.0 - 0.7 K/uL   Basophils Relative 0  0 - 1 %   Basophils Absolute 0.0  0.0 - 0.1 K/uL  COMPREHENSIVE METABOLIC PANEL     Status: Abnormal   Collection Time    12/27/13  8:24 PM      Result Value Ref Range   Sodium 134 (*) 137 - 147 mEq/L   Potassium 4.5  3.7 - 5.3 mEq/L   Chloride 96  96 - 112 mEq/L   CO2 23  19 - 32 mEq/L   Glucose, Bld 96  70 - 99 mg/dL   BUN 19  6 - 23 mg/dL   Creatinine, Ser 0.70  0.50 - 1.10 mg/dL   Calcium 8.9  8.4 - 10.5 mg/dL   Total Protein 6.8  6.0 - 8.3 g/dL   Albumin 3.8  3.5 - 5.2 g/dL   AST 25  0 - 37  U/L   ALT 26  0 - 35 U/L   Alkaline Phosphatase 45  39 - 117 U/L   Total Bilirubin 0.4  0.3 - 1.2 mg/dL   GFR calc non Af Amer 86 (*) >90 mL/min   GFR calc Af Amer >90  >90 mL/min   Comment: (NOTE)     The eGFR has been calculated using the CKD EPI equation.     This calculation has not been validated in all clinical situations.     eGFR's persistently <90 mL/min signify possible Chronic Kidney     Disease.  LIPASE, BLOOD     Status: None   Collection Time    12/27/13  8:24 PM      Result Value Ref Range   Lipase 32  11 - 59 U/L  URINALYSIS, ROUTINE W REFLEX MICROSCOPIC     Status: Abnormal   Collection Time    12/27/13  9:46 PM      Result Value Ref Range   Color, Urine YELLOW  YELLOW   APPearance CLEAR  CLEAR   Specific Gravity, Urine 1.015  1.005 - 1.030   pH 5.5  5.0 - 8.0   Glucose, UA NEGATIVE  NEGATIVE mg/dL   Hgb urine dipstick SMALL (*) NEGATIVE   Bilirubin Urine NEGATIVE  NEGATIVE   Ketones, ur 15 (*) NEGATIVE mg/dL   Protein, ur NEGATIVE  NEGATIVE mg/dL   Urobilinogen, UA 0.2  0.0 - 1.0 mg/dL   Nitrite NEGATIVE  NEGATIVE   Leukocytes, UA NEGATIVE  NEGATIVE  URINE MICROSCOPIC-ADD ON     Status: Abnormal   Collection Time    12/27/13  9:46 PM      Result Value Ref Range   Squamous Epithelial / LPF FEW (*) RARE   WBC, UA 0-2  <3 WBC/hpf   RBC / HPF 0-2  <3 RBC/hpf   Bacteria, UA FEW (*) RARE    Urine-Other MUCOUS PRESENT    PROTIME-INR     Status: Abnormal   Collection Time    12/27/13 11:49 PM      Result Value Ref Range   Prothrombin Time 21.3 (*) 11.6 - 15.2 seconds   INR 1.91 (*) 0.00 - 1.49  LACTIC ACID, PLASMA     Status: None   Collection Time    12/28/13  1:34 AM      Result Value Ref Range   Lactic Acid, Venous 0.8  0.5 - 2.2 mmol/L  PROCALCITONIN     Status: None   Collection Time    12/28/13  1:34 AM      Result Value Ref Range   Procalcitonin 1.35     Comment:            Interpretation:     PCT > 0.5 ng/mL and <= 2 ng/mL:     Systemic infection (sepsis) is possible,     but other conditions are known to elevate     PCT as well.     (NOTE)             ICU PCT Algorithm               Non ICU PCT Algorithm        ----------------------------     ------------------------------             PCT < 0.25 ng/mL                 PCT < 0.1 ng/mL  Stopping of antibiotics            Stopping of antibiotics           strongly encouraged.               strongly encouraged.        ----------------------------     ------------------------------           PCT level decrease by               PCT < 0.25 ng/mL           >= 80% from peak PCT           OR PCT 0.25 - 0.5 ng/mL          Stopping of antibiotics                                                 encouraged.         Stopping of antibiotics               encouraged.        ----------------------------     ------------------------------           PCT level decrease by              PCT >= 0.25 ng/mL           < 80% from peak PCT            AND PCT >= 0.5 ng/mL            Continuing antibiotics                                                  encouraged.           Continuing antibiotics                encouraged.        ----------------------------     ------------------------------         PCT level increase compared          PCT > 0.5 ng/mL             with peak PCT AND              PCT >= 0.5 ng/mL              Escalation of antibiotics                                              strongly encouraged.          Escalation of antibiotics            strongly encouraged.  CULTURE, BLOOD (ROUTINE X 2)     Status: None   Collection Time    12/28/13  1:35 AM      Result Value Ref Range   Specimen Description BLOOD ARM LEFT     Special Requests       Value: BOTTLES DRAWN AEROBIC AND ANAEROBIC BLUE 10CC RED 5CC   Culture  Setup Time  Value: 12/28/2013 13:28     Performed at [facility]   Culture       Value:        BLOOD CULTURE RECEIVED NO GROWTH TO DATE CULTURE WILL BE HELD FOR 5 DAYS BEFORE ISSUING A FINAL NEGATIVE REPORT     Performed at [facility]   Report Status PENDING    MAGNESIUM     Status: None   Collection Time    12/28/13  2:01 AM      Result Value Ref Range   Magnesium 1.6  1.5 - 2.5 mg/dL  PHOSPHORUS     Status: None   Collection Time    12/28/13  2:01 AM      Result Value Ref Range   Phosphorus 2.7  2.3 - 4.6 mg/dL  TSH     Status: None   Collection Time    12/28/13  2:01 AM      Result Value Ref Range   TSH 0.514  0.350 - 4.500 uIU/mL   Comment: Performed at [place]     Status: Abnormal   Collection Time    12/28/13  2:01 AM      Result Value Ref Range   Sodium 132 (*) 137 - 147 mEq/L   Potassium 3.6 (*) 3.7 - 5.3 mEq/L   Comment: DELTA CHECK NOTED   Chloride 98  96 - 112 mEq/L   CO2 20  19 - 32 mEq/L   Glucose, Bld 120 (*) 70 - 99 mg/dL   BUN 14  6 - 23 mg/dL   Creatinine, Ser 0.52  0.50 - 1.10 mg/dL   Calcium 7.6 (*) 8.4 - 10.5 mg/dL   Total Protein 5.4 (*) 6.0 - 8.3 g/dL   Albumin 3.0 (*) 3.5 - 5.2 g/dL   AST 19  0 - 37 U/L   ALT 20  0 - 35 U/L   Alkaline Phosphatase 33 (*) 39 - 117 U/L   Total Bilirubin 0.4  0.3 - 1.2 mg/dL   GFR calc non Af Amer >90  >90 mL/min   GFR calc Af Amer >90  >90 mL/min   Comment: (NOTE)     The eGFR has been calculated using the CKD EPI equation.     This  calculation has not been validated in all clinical situations.     eGFR's persistently <90 mL/min signify possible Chronic Kidney     Disease.  CBC     Status: Abnormal   Collection Time    12/28/13  2:01 AM      Result Value Ref Range   WBC 11.6 (*) 4.0 - 10.5 K/uL   RBC 3.70 (*) 3.87 - 5.11 MIL/uL   Hemoglobin 11.6 (*) 12.0 - 15.0 g/dL   HCT 33.0 (*) 36.0 - 46.0 %   MCV 89.2  78.0 - 100.0 fL   MCH 31.4  26.0 - 34.0 pg   MCHC 35.2  30.0 - 36.0 g/dL   RDW 12.9  11.5 - 15.5 %   Platelets 196  150 - 400 K/uL  CULTURE, BLOOD (ROUTINE X 2)     Status: None   Collection Time    12/28/13  2:05 AM      Result Value Ref Range   Specimen Description BLOOD HAND LEFT     Special Requests BOTTLES DRAWN AEROBIC ONLY 10CC     Culture  Setup Time       Value: 12/28/2013 13:28  Performed at [facility]   Culture       Value:        BLOOD CULTURE RECEIVED NO GROWTH TO DATE CULTURE WILL BE HELD FOR 5 DAYS BEFORE ISSUING A FINAL NEGATIVE REPORT     Performed at [facility]   Report Status PENDING    TYPE AND SCREEN     Status: None   Collection Time    12/28/13  2:07 AM      Result Value Ref Range   ABO/RH(D) O POS     Antibody Screen NEG     Sample Expiration 12/31/2013     Unit Number [id number]     Blood Component Type RED CELLS,LR     Unit division 00     Status of Unit ALLOCATED     Transfusion Status OK TO TRANSFUSE     Crossmatch Result Compatible    PREPARE FRESH FROZEN PLASMA     Status: None   Collection Time    12/28/13  3:04 AM      Result Value Ref Range   Unit Number [id number]     Blood Component Type THAWED PLASMA     Unit division 00     Status of Unit ISSUED,FINAL     Transfusion Status OK TO TRANSFUSE     Unit Number [id number]     Blood Component Type THAWED PLASMA     Unit division 00     Status of Unit ISSUED,FINAL     Transfusion Status OK TO TRANSFUSE    TROPONIN I     Status: None   Collection Time    12/28/13  8:20 AM       Result Value Ref Range   Troponin I <0.30  <0.30 ng/mL   Comment:            Due to the release kinetics of cTnI,     a negative result within the first hours     of the onset of symptoms does not rule out     myocardial infarction with certainty.     If myocardial infarction is still suspected,     repeat the test at appropriate intervals.  MRSA PCR SCREENING     Status: None   Collection Time    12/28/13  1:23 PM      Result Value Ref Range   MRSA by PCR NEGATIVE  NEGATIVE   Comment:            The GeneXpert MRSA Assay (FDA     approved for NASAL specimens     only), is one component of a     comprehensive MRSA colonization     surveillance program. It is not     intended to diagnose MRSA     infection nor to guide or     monitor treatment for     MRSA infections.  TROPONIN I     Status: None   Collection Time    12/28/13  1:30 PM      Result Value Ref Range   Troponin I <0.30  <0.30 ng/mL   Comment:            Due to the release kinetics of cTnI,     a negative result within the first hours     of the onset of symptoms does not rule out     myocardial infarction with certainty.     If myocardial infarction is still suspected,  repeat the test at appropriate intervals.  TROPONIN I     Status: None   Collection Time    12/28/13  7:25 PM      Result Value Ref Range   Troponin I <0.30  <0.30 ng/mL   Comment:            Due to the release kinetics of cTnI,     a negative result within the first hours     of the onset of symptoms does not rule out     myocardial infarction with certainty.     If myocardial infarction is still suspected,     repeat the test at appropriate intervals.  BASIC METABOLIC PANEL     Status: Abnormal   Collection Time    12/29/13  3:30 AM      Result Value Ref Range   Sodium 137  137 - 147 mEq/L   Potassium 3.7  3.7 - 5.3 mEq/L   Chloride 103  96 - 112 mEq/L   CO2 23  19 - 32 mEq/L   Glucose, Bld 88  70 - 99 mg/dL   BUN 6  6 - 23  mg/dL   Creatinine, Ser 0.52  0.50 - 1.10 mg/dL   Calcium 7.3 (*) 8.4 - 10.5 mg/dL   GFR calc non Af Amer >90  >90 mL/min   GFR calc Af Amer >90  >90 mL/min   Comment: (NOTE)     The eGFR has been calculated using the CKD EPI equation.     This calculation has not been validated in all clinical situations.     eGFR's persistently <90 mL/min signify possible Chronic Kidney     Disease.  CBC     Status: Abnormal   Collection Time    12/29/13  3:30 AM      Result Value Ref Range   WBC 6.7  4.0 - 10.5 K/uL   RBC 3.57 (*) 3.87 - 5.11 MIL/uL   Hemoglobin 11.4 (*) 12.0 - 15.0 g/dL   HCT 33.0 (*) 36.0 - 46.0 %   MCV 92.4  78.0 - 100.0 fL   MCH 31.9  26.0 - 34.0 pg   MCHC 34.5  30.0 - 36.0 g/dL   RDW 13.8  11.5 - 15.5 %   Platelets 190  150 - 400 K/uL  MAGNESIUM     Status: None   Collection Time    12/29/13  3:30 AM      Result Value Ref Range   Magnesium 2.0  1.5 - 2.5 mg/dL  PHOSPHORUS     Status: Abnormal   Collection Time    12/29/13  3:30 AM      Result Value Ref Range   Phosphorus 1.8 (*) 2.3 - 4.6 mg/dL  APTT     Status: Abnormal   Collection Time    12/29/13  3:30 AM      Result Value Ref Range   aPTT 45 (*) 24 - 37 seconds   Comment:            IF BASELINE aPTT IS ELEVATED,     SUGGEST PATIENT RISK ASSESSMENT     BE USED TO DETERMINE APPROPRIATE     ANTICOAGULANT THERAPY.  PROTIME-INR     Status: Abnormal   Collection Time    12/29/13  3:30 AM      Result Value Ref Range   Prothrombin Time 22.6 (*) 11.6 - 15.2 seconds   INR 2.06 (*) 0.00 - 1.49  Imaging / Studies: Ct Abdomen Pelvis Wo Contrast  12/27/2013   CLINICAL DATA:  Low abdominal pain, nausea, constipation  EXAM: CT ABDOMEN AND PELVIS WITHOUT CONTRAST  TECHNIQUE: Multidetector CT imaging of the abdomen and pelvis was performed following the standard protocol without intravenous contrast.  COMPARISON:  Recent prior CT abdomen/ pelvis 12/03/2013  FINDINGS: Lower Chest: The lung bases are clear. Visualized  cardiac structures are within normal limits for size. No pericardial effusion. Small hiatal hernia. Atherosclerotic calcification noted in the visualized coronary arteries.  Abdomen: Unenhanced CT was performed per clinician order. Lack of IV contrast limits sensitivity and specificity, especially for evaluation of abdominal/pelvic solid viscera. Within these limitations, unremarkable CT appearance of the stomach, duodenum, spleen, adrenal glands, pancreas. Stable 1 cm circumscribed hypo attenuating focus in hepatic segment 2/3. No new focal hepatic lesion. Punctate calcification in the right hepatic lobe suggest sequelae of old granulomatous disease. Gallbladder is unremarkable. No intra or extrahepatic biliary ductal dilatation.  Unremarkable appearance of the bilateral kidneys. No focal solid lesion, hydronephrosis or nephrolithiasis.  Colonic diverticular disease. Interstitial inflammatory stranding is noted about the proximal sigmoid colon and an adjacent loop of distal ileum. There is a small amount of extraluminal air position between the loop of small bowel and the colon. No definite free fluid or abscess collection. The inflammatory change appears to be centered around the sigmoid colon. The small bowel changes are likely reactive. The remainder of the colon is unremarkable. No bowel obstruction.  Pelvis: Heterogeneous uterus with internal calcifications likely reflecting degenerated fibroids. No free fluid or suspicious adenopathy.  Bones/Soft Tissues: No acute fracture or aggressive appearing lytic or blastic osseous lesion.  Vascular: Atherosclerotic vascular disease without significant stenosis or aneurysmal dilatation.  IMPRESSION: 1. Acute sigmoid diverticulitis complicated by small contained perforation and secondary inflammatory changes involving an adjacent loop of ileum. No drainable fluid collection or abscess identified. 2. Atherosclerosis including coronary artery disease. 3. Hiatal hernia.  These results were called by telephone at the time of interpretation on 12/27/2013 at 11:04 PM to Dr. Carmin Muskrat , who verbally acknowledged these results.   Electronically Signed   By: Jacqulynn Cadet M.D.   On: 12/27/2013 23:05    Medications / Allergies: per chart  Antibiotics: Anti-infectives   Start     Dose/Rate Route Frequency Ordered Stop   12/29/13 0000  ertapenem (INVANZ) 1 g in sodium chloride 0.9 % 50 mL IVPB     1 g 100 mL/hr over 30 Minutes Intravenous Every 24 hours 12/28/13 0855     12/28/13 0200  ertapenem (INVANZ) 1 g in sodium chloride 0.9 % 50 mL IVPB  Status:  Discontinued     1 g 100 mL/hr over 30 Minutes Intravenous Every 24 hours 12/28/13 0150 12/28/13 1141   12/28/13 0145  piperacillin-tazobactam (ZOSYN) IVPB 3.375 g  Status:  Discontinued     3.375 g 12.5 mL/hr over 240 Minutes Intravenous 3 times per day 12/28/13 0142 12/28/13 0150   12/27/13 2330  ertapenem (INVANZ) 1 g in sodium chloride 0.9 % 50 mL IVPB     1 g 100 mL/hr over 30 Minutes Intravenous  Once 12/27/13 2327 12/28/13 0049   12/27/13 2315  ciprofloxacin (CIPRO) IVPB 400 mg  Status:  Discontinued     400 mg 200 mL/hr over 60 Minutes Intravenous  Once 12/27/13 2307 12/27/13 2327   12/27/13 2315  metroNIDAZOLE (FLAGYL) IVPB 500 mg  Status:  Discontinued     500 mg 100 mL/hr over 60  Minutes Intravenous  Once 12/27/13 2307 12/27/13 2327      Assessment/Plan Exploratory laparotomy Hartman's procedure with splenic flexure mobilization.  Sigmoid colon resection w/ colostomy (Dr. [name]) POD#1 -Pain not well controlled with morphine PCA.  Change to dilaudid PCA -Continue NPO x ice chips and await bowel function -Invanz 2/15----> -Ostomy care, WOC consult -daily wet to dry dressing changes -Mobilize -IS -IV hydration  -INR is 2 today, on lovenox.  Will leave up to primary team whether to stop or continue  Erby Pian, [facility] Surgery Pager [phone] Office  [phone]  12/29/2013 9:52 AM

## 2013-12-29 NOTE — Progress Notes (Signed)
Looks good Sitting in chair Pain better since switched to dilaudid  Soft, expected TTP. Ostomy viable - edematous, no air No edema  Cards consult to let us know if she will need anticoag b/c of ho of PAF. INR 2 today so should be ok. Repeat coags in am. Ok with hep gtt starting Tuesday if needed but NO bolus.  Will cont prophylactic VTE txt with lovenox Sips of clears  Ok with tx to floor with cardiac monitoring Replace Phosphorous  [name]. [name], MD, FACS General, Bariatric, & Minimally Invasive Surgery [facility] Surgery, [place]

## 2013-12-29 NOTE — Progress Notes (Signed)
[facility] PROGRESS NOTE  [name] JQZ:[id number] DOB: February 13, 1946 DOA: 12/27/2013 PCP: [name], MD  Assessment/Plan: Perforated Diverticulitis -S/p ex lap with ostomy 2/15. -Continue Invanz. -Management as per surgery.  A Fib -Rate controlled. -INR 2.06 today. -Resume coumadin once ok with surgery.  CAD -No further CP. -Stable. -Troponins have been negative.  Code Status: Full Code Family Communication: Discussed with daughter at bedside.  Disposition Plan: To be determined.   Consultants:  Surgery   Antibiotics:  Invanz   Subjective: Still with some abdominal pain. Using PCA every time she can. Sitting up in chair.  Objective: Filed Vitals:   12/29/13 0405 12/29/13 0740 12/29/13 0755 12/29/13 1053  BP: 126/47 130/56 130/56   Pulse: 90 80 92   Temp: 98.1 F (36.7 C)  98.3 F (36.8 C)   TempSrc: Oral  Oral   Resp: 23 23 21 18   Height:      Weight:      SpO2: 92% 99% 97% 99%    Intake/Output Summary (Last 24 hours) at 12/29/13 1116 Last data filed at 12/29/13 0900  Gross per 24 hour  Intake   1875 ml  Output   2075 ml  Net   -200 ml   Filed Weights   12/27/13 1951 12/28/13 0421 12/28/13 0938  Weight: 67.132 kg (148 lb) 67.132 kg (148 lb) 67.5 kg (148 lb 13 oz)    Exam:   General:  AA Ox3  Cardiovascular: RRR  Respiratory: CTA B  Abdomen: dressing/ostomy in place, soft, +BS  Extremities: no C/C/E/+pedal pulses.   Neurologic:  Non-focal. I have not seen her ambulate.  Data Reviewed: Basic Metabolic Panel:  Recent Labs Lab 12/27/13 2024 12/28/13 0201 12/29/13 0330  NA 134* 132* 137  K 4.5 3.6* 3.7  CL 96 98 103  CO2 23 20 23   GLUCOSE 96 120* 88  BUN 19 14 6   CREATININE 0.70 0.52 0.52  CALCIUM 8.9 7.6* 7.3*  MG  --  1.6 2.0  PHOS  --  2.7 1.8*   Liver Function Tests:  Recent Labs Lab 12/27/13 2024 12/28/13 0201  AST 25 19  ALT 26 20  ALKPHOS 45 33*  BILITOT 0.4 0.4  PROT 6.8 5.4*  ALBUMIN 3.8  3.0*    Recent Labs Lab 12/27/13 2024  LIPASE 32   No results found for this basename: AMMONIA,  in the last 168 hours CBC:  Recent Labs Lab 12/27/13 2024 12/28/13 0201 12/29/13 0330  WBC 12.2* 11.6* 6.7  NEUTROABS 9.6*  --   --   HGB 13.9 11.6* 11.4*  HCT 39.4 33.0* 33.0*  MCV 90.2 89.2 92.4  PLT 245 196 190   Cardiac Enzymes:  Recent Labs Lab 12/28/13 0820 12/28/13 1330 12/28/13 1925  TROPONINI <0.30 <0.30 <0.30   BNP (last 3 results) No results found for this basename: PROBNP,  in the last 8760 hours CBG: No results found for this basename: GLUCAP,  in the last 168 hours  Recent Results (from the past 240 hour(s))  CULTURE, BLOOD (ROUTINE X 2)     Status: None   Collection Time    12/28/13  1:35 AM      Result Value Ref Range Status   Specimen Description BLOOD ARM LEFT   Final   Special Requests     Final   Value: BOTTLES DRAWN AEROBIC AND ANAEROBIC BLUE 10CC RED 5CC   Culture  Setup Time     Final   Value: 12/28/2013 13:28  Performed at [facility]     Final   Value:        BLOOD CULTURE RECEIVED NO GROWTH TO DATE CULTURE WILL BE HELD FOR 5 DAYS BEFORE ISSUING A FINAL NEGATIVE REPORT     Performed at [facility]   Report Status PENDING   Incomplete  CULTURE, BLOOD (ROUTINE X 2)     Status: None   Collection Time    12/28/13  2:05 AM      Result Value Ref Range Status   Specimen Description BLOOD HAND LEFT   Final   Special Requests BOTTLES DRAWN AEROBIC ONLY 10CC   Final   Culture  Setup Time     Final   Value: 12/28/2013 13:28     Performed at [facility]   Culture     Final   Value:        BLOOD CULTURE RECEIVED NO GROWTH TO DATE CULTURE WILL BE HELD FOR 5 DAYS BEFORE ISSUING A FINAL NEGATIVE REPORT     Performed at [facility]   Report Status PENDING   Incomplete  MRSA PCR SCREENING     Status: None   Collection Time    12/28/13  1:23 PM      Result Value Ref Range Status   MRSA by PCR  NEGATIVE  NEGATIVE Final   Comment:            The GeneXpert MRSA Assay (FDA     approved for NASAL specimens     only), is one component of a     comprehensive MRSA colonization     surveillance program. It is not     intended to diagnose MRSA     infection nor to guide or     monitor treatment for     MRSA infections.     Studies: Ct Abdomen Pelvis Wo Contrast  12/27/2013   CLINICAL DATA:  Low abdominal pain, nausea, constipation  EXAM: CT ABDOMEN AND PELVIS WITHOUT CONTRAST  TECHNIQUE: Multidetector CT imaging of the abdomen and pelvis was performed following the standard protocol without intravenous contrast.  COMPARISON:  Recent prior CT abdomen/ pelvis 12/03/2013  FINDINGS: Lower Chest: The lung bases are clear. Visualized cardiac structures are within normal limits for size. No pericardial effusion. Small hiatal hernia. Atherosclerotic calcification noted in the visualized coronary arteries.  Abdomen: Unenhanced CT was performed per clinician order. Lack of IV contrast limits sensitivity and specificity, especially for evaluation of abdominal/pelvic solid viscera. Within these limitations, unremarkable CT appearance of the stomach, duodenum, spleen, adrenal glands, pancreas. Stable 1 cm circumscribed hypo attenuating focus in hepatic segment 2/3. No new focal hepatic lesion. Punctate calcification in the right hepatic lobe suggest sequelae of old granulomatous disease. Gallbladder is unremarkable. No intra or extrahepatic biliary ductal dilatation.  Unremarkable appearance of the bilateral kidneys. No focal solid lesion, hydronephrosis or nephrolithiasis.  Colonic diverticular disease. Interstitial inflammatory stranding is noted about the proximal sigmoid colon and an adjacent loop of distal ileum. There is a small amount of extraluminal air position between the loop of small bowel and the colon. No definite free fluid or abscess collection. The inflammatory change appears to be centered  around the sigmoid colon. The small bowel changes are likely reactive. The remainder of the colon is unremarkable. No bowel obstruction.  Pelvis: Heterogeneous uterus with internal calcifications likely reflecting degenerated fibroids. No free fluid or suspicious adenopathy.  Bones/Soft Tissues: No acute fracture or aggressive  appearing lytic or blastic osseous lesion.  Vascular: Atherosclerotic vascular disease without significant stenosis or aneurysmal dilatation.  IMPRESSION: 1. Acute sigmoid diverticulitis complicated by small contained perforation and secondary inflammatory changes involving an adjacent loop of ileum. No drainable fluid collection or abscess identified. 2. Atherosclerosis including coronary artery disease. 3. Hiatal hernia. These results were called by telephone at the time of interpretation on 12/27/2013 at 11:04 PM to Dr. Carmin Muskrat , who verbally acknowledged these results.   Electronically Signed   By: Jacqulynn Cadet M.D.   On: 12/27/2013 23:05    Scheduled Meds: . enoxaparin (LOVENOX) injection  40 mg Subcutaneous Q24H  . ertapenem  1 g Intravenous Q24H  . HYDROmorphone PCA 0.3 mg/mL   Intravenous 6 times per day  . ondansetron  4 mg Intravenous Once  . sodium chloride  3 mL Intravenous Q12H   Continuous Infusions: . dextrose 5 % and 0.9% NaCl 100 mL/hr at 12/29/13 1057    Active Problems:   Paroxysmal atrial fibrillation, maintaining SR on coumadin    Long term (current) use of anticoagulants   CAD (coronary artery disease) with CABG   HTN (hypertension)   Diverticulitis with perforation   Sepsis   Perforated diverticulitis    Time spent: 35 minutes. Greater than 50% of this time was spent in direct contact with the patient coordinating care.    Lelon Frohlich  [facility] Pager [phone]  If 7PM-7AM, please contact night-coverage at [email or web address], password [email or web address] 12/29/2013, 11:16 AM  LOS: 2 days

## 2013-12-29 NOTE — Telephone Encounter (Signed)
Pt wanted to let you know that she had emergency colon surgery over the weekend.TK

## 2013-12-29 NOTE — Telephone Encounter (Signed)
Spoke with pt, sounded quite drowsy, not sure she'll remember this conversation.  Assured her that she can make appt for INR at hospital discharge and we will continue to follow.

## 2013-12-29 NOTE — Progress Notes (Signed)
Wonda Cerise, RN witnessed dilaudid PCA set-up with Earnest Bailey, Therapist, sports. Also, we wasted 19mg  of morphine PCA down the sink.

## 2013-12-29 NOTE — Consult Note (Signed)
WOC ostomy consult note Stoma type/location:  LLQ, end colostomy.  Surgery just yesteraday Stomal assessment/size: 2" aprox, edematous Output only some bloody drainage Ostomy pouching: from OR.  Education provided: daughter at bedside.  Explained rationale for stoma creation and just began some basic ostomy care teaching.  Pt still with PCA.  Will drop off educational materials with patient and her daughter.  Supplies ordered to be placed in patient room for use at next pouch change.  WOC will follow along with you Para March RN,CWOCN [phone]

## 2013-12-30 ENCOUNTER — Encounter (HOSPITAL_COMMUNITY): Payer: Self-pay | Admitting: General Surgery

## 2013-12-30 LAB — CBC
HCT: 30.2 % — ABNORMAL LOW (ref 36.0–46.0)
Hemoglobin: 10.3 g/dL — ABNORMAL LOW (ref 12.0–15.0)
MCH: 31.7 pg (ref 26.0–34.0)
MCHC: 34.1 g/dL (ref 30.0–36.0)
MCV: 92.9 fL (ref 78.0–100.0)
Platelets: 170 10*3/uL (ref 150–400)
RBC: 3.25 MIL/uL — ABNORMAL LOW (ref 3.87–5.11)
RDW: 13.7 % (ref 11.5–15.5)
WBC: 6.1 10*3/uL (ref 4.0–10.5)

## 2013-12-30 LAB — PROTIME-INR
INR: 1.82 — ABNORMAL HIGH (ref 0.00–1.49)
Prothrombin Time: 20.5 seconds — ABNORMAL HIGH (ref 11.6–15.2)

## 2013-12-30 LAB — PROCALCITONIN: Procalcitonin: 0.68 ng/mL

## 2013-12-30 LAB — APTT: aPTT: 46 seconds — ABNORMAL HIGH (ref 24–37)

## 2013-12-30 NOTE — Progress Notes (Signed)
Patient ID: [name], female   DOB: August 23, 1946, 68 y.o.   MRN: [id number]  Subjective: Pt sitting up in a chair.  Pain is much better yesterday with change in pain meds.  No stool or flatus in ostomy. VSS.  White count is normal.  Mild anemia, stable. Ambulated 2x yesterday.   Objective:  Vital signs:  Filed Vitals:   12/30/13 0000 12/30/13 0359 12/30/13 0437 12/30/13 0803  BP:   122/56   Pulse:   80 76  Temp:   98 F (36.7 C) 98.3 F (36.8 C)  TempSrc:   Oral Oral  Resp: $Remo'15 14 15 25  'KErcO$ Height:      Weight:      SpO2: 100% 98% 99% 99%       Intake/Output   Yesterday:  02/16 0701 - 02/17 0700 In: 902 [I.V.:650; IV Piggyback:252] Out: 0923 [RAQTM:2263; Stool:50] This shift:  Total I/O In: -  Out: 200 [Urine:200]  Physical Exam:  General: Pt awake/alert/oriented x3 in no acute distress  Abdomen: Soft. Non distended. Hypoactive bowel sounds. Ostomy with serous output no stool. Midline incision dressing is c/d/i.  Problem List:   Active Problems:   Paroxysmal atrial fibrillation, maintaining SR on coumadin    Long term (current) use of anticoagulants   CAD (coronary artery disease) with CABG   HTN (hypertension)   Diverticulitis with perforation   Sepsis   Perforated diverticulitis    Results:   Labs: Results for orders placed during the hospital encounter of 12/27/13 (from the past 40 hour(s))  MRSA PCR SCREENING     Status: None   Collection Time    12/28/13  1:23 PM      Result Value Ref Range   MRSA by PCR NEGATIVE  NEGATIVE   Comment:            The GeneXpert MRSA Assay (FDA     approved for NASAL specimens     only), is one component of a     comprehensive MRSA colonization     surveillance program. It is not     intended to diagnose MRSA     infection nor to guide or     monitor treatment for     MRSA infections.  TROPONIN I     Status: None   Collection Time    12/28/13  1:30 PM      Result Value Ref Range   Troponin I <0.30  <0.30  ng/mL   Comment:            Due to the release kinetics of cTnI,     a negative result within the first hours     of the onset of symptoms does not rule out     myocardial infarction with certainty.     If myocardial infarction is still suspected,     repeat the test at appropriate intervals.  TROPONIN I     Status: None   Collection Time    12/28/13  7:25 PM      Result Value Ref Range   Troponin I <0.30  <0.30 ng/mL   Comment:            Due to the release kinetics of cTnI,     a negative result within the first hours     of the onset of symptoms does not rule out     myocardial infarction with certainty.     If myocardial infarction is still suspected,  repeat the test at appropriate intervals.  BASIC METABOLIC PANEL     Status: Abnormal   Collection Time    12/29/13  3:30 AM      Result Value Ref Range   Sodium 137  137 - 147 mEq/L   Potassium 3.7  3.7 - 5.3 mEq/L   Chloride 103  96 - 112 mEq/L   CO2 23  19 - 32 mEq/L   Glucose, Bld 88  70 - 99 mg/dL   BUN 6  6 - 23 mg/dL   Creatinine, Ser 0.52  0.50 - 1.10 mg/dL   Calcium 7.3 (*) 8.4 - 10.5 mg/dL   GFR calc non Af Amer >90  >90 mL/min   GFR calc Af Amer >90  >90 mL/min   Comment: (NOTE)     The eGFR has been calculated using the CKD EPI equation.     This calculation has not been validated in all clinical situations.     eGFR's persistently <90 mL/min signify possible Chronic Kidney     Disease.  CBC     Status: Abnormal   Collection Time    12/29/13  3:30 AM      Result Value Ref Range   WBC 6.7  4.0 - 10.5 K/uL   RBC 3.57 (*) 3.87 - 5.11 MIL/uL   Hemoglobin 11.4 (*) 12.0 - 15.0 g/dL   HCT 33.0 (*) 36.0 - 46.0 %   MCV 92.4  78.0 - 100.0 fL   MCH 31.9  26.0 - 34.0 pg   MCHC 34.5  30.0 - 36.0 g/dL   RDW 13.8  11.5 - 15.5 %   Platelets 190  150 - 400 K/uL  MAGNESIUM     Status: None   Collection Time    12/29/13  3:30 AM      Result Value Ref Range   Magnesium 2.0  1.5 - 2.5 mg/dL  PHOSPHORUS     Status:  Abnormal   Collection Time    12/29/13  3:30 AM      Result Value Ref Range   Phosphorus 1.8 (*) 2.3 - 4.6 mg/dL  APTT     Status: Abnormal   Collection Time    12/29/13  3:30 AM      Result Value Ref Range   aPTT 45 (*) 24 - 37 seconds   Comment:            IF BASELINE aPTT IS ELEVATED,     SUGGEST PATIENT RISK ASSESSMENT     BE USED TO DETERMINE APPROPRIATE     ANTICOAGULANT THERAPY.  PROTIME-INR     Status: Abnormal   Collection Time    12/29/13  3:30 AM      Result Value Ref Range   Prothrombin Time 22.6 (*) 11.6 - 15.2 seconds   INR 2.06 (*) 0.00 - 1.49  BLOOD PRODUCT ORDER (VERBAL) VERIFICATION     Status: None   Collection Time    12/29/13  7:33 PM      Result Value Ref Range   Blood product order confirm MD AUTHORIZATION REQUESTED    CBC     Status: Abnormal   Collection Time    12/30/13  3:22 AM      Result Value Ref Range   WBC 6.1  4.0 - 10.5 K/uL   RBC 3.25 (*) 3.87 - 5.11 MIL/uL   Hemoglobin 10.3 (*) 12.0 - 15.0 g/dL   HCT 30.2 (*) 36.0 - 46.0 %   MCV 92.9  78.0 - 100.0 fL   MCH 31.7  26.0 - 34.0 pg   MCHC 34.1  30.0 - 36.0 g/dL   RDW 13.7  11.5 - 15.5 %   Platelets 170  150 - 400 K/uL  APTT     Status: Abnormal   Collection Time    12/30/13  3:22 AM      Result Value Ref Range   aPTT 46 (*) 24 - 37 seconds   Comment:            IF BASELINE aPTT IS ELEVATED,     SUGGEST PATIENT RISK ASSESSMENT     BE USED TO DETERMINE APPROPRIATE     ANTICOAGULANT THERAPY.  PROTIME-INR     Status: Abnormal   Collection Time    12/30/13  3:22 AM      Result Value Ref Range   Prothrombin Time 20.5 (*) 11.6 - 15.2 seconds   INR 1.82 (*) 0.00 - 1.49  PROCALCITONIN     Status: None   Collection Time    12/30/13  3:22 AM      Result Value Ref Range   Procalcitonin 0.68     Comment:            Interpretation:     PCT > 0.5 ng/mL and <= 2 ng/mL:     Systemic infection (sepsis) is possible,     but other conditions are known to elevate     PCT as well.     (NOTE)              ICU PCT Algorithm               Non ICU PCT Algorithm        ----------------------------     ------------------------------             PCT < 0.25 ng/mL                 PCT < 0.1 ng/mL         Stopping of antibiotics            Stopping of antibiotics           strongly encouraged.               strongly encouraged.        ----------------------------     ------------------------------           PCT level decrease by               PCT < 0.25 ng/mL           >= 80% from peak PCT           OR PCT 0.25 - 0.5 ng/mL          Stopping of antibiotics                                                 encouraged.         Stopping of antibiotics               encouraged.        ----------------------------     ------------------------------           PCT level decrease by              PCT >= 0.25  ng/mL           < 80% from peak PCT            AND PCT >= 0.5 ng/mL            Continuing antibiotics                                                  encouraged.           Continuing antibiotics                encouraged.        ----------------------------     ------------------------------         PCT level increase compared          PCT > 0.5 ng/mL             with peak PCT AND              PCT >= 0.5 ng/mL             Escalation of antibiotics                                              strongly encouraged.          Escalation of antibiotics            strongly encouraged.    Imaging / Studies: No results found.  Medications / Allergies: per chart  Antibiotics: Anti-infectives   Start     Dose/Rate Route Frequency Ordered Stop   12/29/13 0000  ertapenem (INVANZ) 1 g in sodium chloride 0.9 % 50 mL IVPB     1 g 100 mL/hr over 30 Minutes Intravenous Every 24 hours 12/28/13 0855     12/28/13 0200  ertapenem (INVANZ) 1 g in sodium chloride 0.9 % 50 mL IVPB  Status:  Discontinued     1 g 100 mL/hr over 30 Minutes Intravenous Every 24 hours 12/28/13 0150 12/28/13 1141   12/28/13  0145  piperacillin-tazobactam (ZOSYN) IVPB 3.375 g  Status:  Discontinued     3.375 g 12.5 mL/hr over 240 Minutes Intravenous 3 times per day 12/28/13 0142 12/28/13 0150   12/27/13 2330  ertapenem (INVANZ) 1 g in sodium chloride 0.9 % 50 mL IVPB     1 g 100 mL/hr over 30 Minutes Intravenous  Once 12/27/13 2327 12/28/13 0049   12/27/13 2315  ciprofloxacin (CIPRO) IVPB 400 mg  Status:  Discontinued     400 mg 200 mL/hr over 60 Minutes Intravenous  Once 12/27/13 2307 12/27/13 2327   12/27/13 2315  metroNIDAZOLE (FLAGYL) IVPB 500 mg  Status:  Discontinued     500 mg 100 mL/hr over 60 Minutes Intravenous  Once 12/27/13 2307 12/27/13 2327     Assessment/Plan  Exploratory laparotomy Hartman's procedure with splenic flexure mobilization. Sigmoid colon resection w/ colostomy  (Dr. [name])  POD#2  -continue Dilaudid PCA, transition to oral and IV push when tolerating oral intake -Continue NPO x ice chips and await bowel function  -Invanz 2/15---->  -Ostomy care, WOC consult  -daily wet to dry dressing changes  -Mobilize  -IS(784ml) -IV hydration  -lovenox, SCDs -stable for transfer to floor/tele from surgical standpoint  Erby Pian, [facility] Pager [phone] Office [phone]  12/30/2013 9:25 AM

## 2013-12-30 NOTE — Progress Notes (Signed)
[facility] PROGRESS NOTE  [name] PQD:[id number] DOB: [birth year] DOA: 12/27/2013 PCP: [name], MD  Assessment/Plan: Perforated Diverticulitis -S/p ex lap with ostomy 2/15. -Continue Invanz. -Management as per surgery.  A Fib -Rate controlled. -INR 1.82 today; drifting below goal range. -Resume coumadin once ok with surgery.  CAD -No further CP. -Stable. -Troponins have been negative.  Code Status: Full Code Family Communication: Discussed with daughter at bedside.  Disposition Plan: To be determined. Transfer to floor if ok with surgery.   Consultants:  Surgery   Antibiotics:  Invanz   Subjective: Still with some abdominal pain. Using PCA every time she can. Sitting up in chair reading newspaper. Remains NPO.  Objective: Filed Vitals:   12/30/13 0000 12/30/13 0359 12/30/13 0437 12/30/13 0803  BP:   122/56   Pulse:   80 76  Temp:   98 F (36.7 C) 98.3 F (36.8 C)  TempSrc:   Oral Oral  Resp: 15 14 15 25   Height:      Weight:      SpO2: 100% 98% 99% 99%    Intake/Output Summary (Last 24 hours) at 12/30/13 0952 Last data filed at 12/30/13 0803  Gross per 24 hour  Intake    652 ml  Output   1450 ml  Net   -798 ml   Filed Weights   12/27/13 1951 12/28/13 0421 12/28/13 0938  Weight: 67.132 kg (148 lb) 67.132 kg (148 lb) 67.5 kg (148 lb 13 oz)    Exam:   General:  AA Ox3  Cardiovascular: RRR  Respiratory: CTA B  Abdomen: dressing/ostomy in place, soft, +BS  Extremities: no C/C/E/+pedal pulses.   Neurologic:  Non-focal. I have not seen her ambulate.  Data Reviewed: Basic Metabolic Panel:  Recent Labs Lab 12/27/13 2024 12/28/13 0201 12/29/13 0330  NA 134* 132* 137  K 4.5 3.6* 3.7  CL 96 98 103  CO2 23 20 23   GLUCOSE 96 120* 88  BUN 19 14 6   CREATININE 0.70 0.52 0.52  CALCIUM 8.9 7.6* 7.3*  MG  --  1.6 2.0  PHOS  --  2.7 1.8*   Liver Function Tests:  Recent Labs Lab 12/27/13 2024 12/28/13 0201  AST 25  19  ALT 26 20  ALKPHOS 45 33*  BILITOT 0.4 0.4  PROT 6.8 5.4*  ALBUMIN 3.8 3.0*    Recent Labs Lab 12/27/13 2024  LIPASE 32   No results found for this basename: AMMONIA,  in the last 168 hours CBC:  Recent Labs Lab 12/27/13 2024 12/28/13 0201 12/29/13 0330 12/30/13 0322  WBC 12.2* 11.6* 6.7 6.1  NEUTROABS 9.6*  --   --   --   HGB 13.9 11.6* 11.4* 10.3*  HCT 39.4 33.0* 33.0* 30.2*  MCV 90.2 89.2 92.4 92.9  PLT 245 196 190 170   Cardiac Enzymes:  Recent Labs Lab 12/28/13 0820 12/28/13 1330 12/28/13 1925  TROPONINI <0.30 <0.30 <0.30   BNP (last 3 results) No results found for this basename: PROBNP,  in the last 8760 hours CBG: No results found for this basename: GLUCAP,  in the last 168 hours  Recent Results (from the past 240 hour(s))  CULTURE, BLOOD (ROUTINE X 2)     Status: None   Collection Time    12/28/13  1:35 AM      Result Value Ref Range Status   Specimen Description BLOOD ARM LEFT   Final   Special Requests     Final   Value: BOTTLES DRAWN  AEROBIC AND ANAEROBIC BLUE 10CC RED 5CC   Culture  Setup Time     Final   Value: 12/28/2013 13:28     Performed at [facility]   Culture     Final   Value:        BLOOD CULTURE RECEIVED NO GROWTH TO DATE CULTURE WILL BE HELD FOR 5 DAYS BEFORE ISSUING A FINAL NEGATIVE REPORT     Performed at [facility]   Report Status PENDING   Incomplete  CULTURE, BLOOD (ROUTINE X 2)     Status: None   Collection Time    12/28/13  2:05 AM      Result Value Ref Range Status   Specimen Description BLOOD HAND LEFT   Final   Special Requests BOTTLES DRAWN AEROBIC ONLY 10CC   Final   Culture  Setup Time     Final   Value: 12/28/2013 13:28     Performed at [facility]   Culture     Final   Value:        BLOOD CULTURE RECEIVED NO GROWTH TO DATE CULTURE WILL BE HELD FOR 5 DAYS BEFORE ISSUING A FINAL NEGATIVE REPORT     Performed at [facility]   Report Status PENDING   Incomplete  MRSA PCR  SCREENING     Status: None   Collection Time    12/28/13  1:23 PM      Result Value Ref Range Status   MRSA by PCR NEGATIVE  NEGATIVE Final   Comment:            The GeneXpert MRSA Assay (FDA     approved for NASAL specimens     only), is one component of a     comprehensive MRSA colonization     surveillance program. It is not     intended to diagnose MRSA     infection nor to guide or     monitor treatment for     MRSA infections.     Studies: No results found.  Scheduled Meds: . enoxaparin (LOVENOX) injection  40 mg Subcutaneous Q24H  . ertapenem  1 g Intravenous Q24H  . HYDROmorphone PCA 0.3 mg/mL   Intravenous 6 times per day  . ondansetron  4 mg Intravenous Once  . sodium chloride  3 mL Intravenous Q12H   Continuous Infusions: . dextrose 5 % and 0.9% NaCl 100 mL/hr at 12/29/13 1057    Active Problems:   Paroxysmal atrial fibrillation, maintaining SR on coumadin    Long term (current) use of anticoagulants   CAD (coronary artery disease) with CABG   HTN (hypertension)   Diverticulitis with perforation   Sepsis   Perforated diverticulitis    Time spent: 25 minutes. Greater than 50% of this time was spent in direct contact with the patient coordinating care.    Lelon Frohlich  [facility] Pager [phone]  If 7PM-7AM, please contact night-coverage at [email or web address], password [facility] 12/30/2013, 9:52 AM  LOS: 3 days

## 2013-12-30 NOTE — Progress Notes (Signed)
Some burping/belching. No air in bag. Walking. Tolerated sips but burped  Alert, nad cta b/l Soft, distended. Ostomy ok -edema. No air in bag.   Ok to tx out of unit D/c foley Clear liquids - but would hold off on oral meds until now pt tolerating liquids Therefore, hold off on coumadin today.  Ambulate.   [name]. [name], MD, FACS General, Bariatric, & Minimally Invasive Surgery [facility], [place]

## 2013-12-31 ENCOUNTER — Inpatient Hospital Stay (HOSPITAL_COMMUNITY): Payer: Medicare Other

## 2013-12-31 ENCOUNTER — Ambulatory Visit: Payer: Medicare Other | Admitting: Pharmacist Clinician (PhC)/ Clinical Pharmacy Specialist

## 2013-12-31 DIAGNOSIS — I25810 Atherosclerosis of coronary artery bypass graft(s) without angina pectoris: Secondary | ICD-10-CM

## 2013-12-31 DIAGNOSIS — E876 Hypokalemia: Secondary | ICD-10-CM

## 2013-12-31 DIAGNOSIS — I10 Essential (primary) hypertension: Secondary | ICD-10-CM

## 2013-12-31 LAB — BASIC METABOLIC PANEL
BUN: 3 mg/dL — ABNORMAL LOW (ref 6–23)
CO2: 26 mEq/L (ref 19–32)
Calcium: 7.6 mg/dL — ABNORMAL LOW (ref 8.4–10.5)
Chloride: 103 mEq/L (ref 96–112)
Creatinine, Ser: 0.43 mg/dL — ABNORMAL LOW (ref 0.50–1.10)
GFR calc Af Amer: >90 mL/min (ref >90)
GFR calc non Af Amer: >90 mL/min (ref >90)
Glucose, Bld: 133 mg/dL — ABNORMAL HIGH (ref 70–99)
Potassium: 3.1 mEq/L — ABNORMAL LOW (ref 3.7–5.3)
Sodium: 136 mEq/L — ABNORMAL LOW (ref 137–147)

## 2013-12-31 LAB — APTT: aPTT: 44 seconds — ABNORMAL HIGH (ref 24–37)

## 2013-12-31 LAB — CBC
HCT: 28.4 % — ABNORMAL LOW (ref 36.0–46.0)
Hemoglobin: 9.8 g/dL — ABNORMAL LOW (ref 12.0–15.0)
MCH: 31.6 pg (ref 26.0–34.0)
MCHC: 34.5 g/dL (ref 30.0–36.0)
MCV: 91.6 fL (ref 78.0–100.0)
Platelets: 171 10*3/uL (ref 150–400)
RBC: 3.1 MIL/uL — ABNORMAL LOW (ref 3.87–5.11)
RDW: 13.5 % (ref 11.5–15.5)
WBC: 6.1 10*3/uL (ref 4.0–10.5)

## 2013-12-31 LAB — PROTIME-INR
INR: 1.85 — ABNORMAL HIGH (ref 0.00–1.49)
Prothrombin Time: 20.8 seconds — ABNORMAL HIGH (ref 11.6–15.2)

## 2013-12-31 MED ORDER — METOPROLOL TARTRATE 25 MG PO TABS
25.0000 mg | ORAL_TABLET | Freq: Two times a day (BID) | ORAL | Status: DC
  Administered 2013-12-31 – 2014-01-03 (×7): 25 mg via ORAL
  Filled 2013-12-31 (×9): qty 1

## 2013-12-31 MED ORDER — POTASSIUM CHLORIDE 10 MEQ/100ML IV SOLN
10.0000 meq | INTRAVENOUS | Status: DC
  Administered 2013-12-31: 10 meq via INTRAVENOUS
  Filled 2013-12-31 (×3): qty 100

## 2013-12-31 MED ORDER — WARFARIN - PHARMACIST DOSING INPATIENT: Freq: Every day | Status: DC

## 2013-12-31 MED ORDER — WARFARIN SODIUM 2.5 MG PO TABS
2.5000 mg | ORAL_TABLET | Freq: Once | ORAL | Status: AC
  Administered 2013-12-31: 2.5 mg via ORAL
  Filled 2013-12-31: qty 1

## 2013-12-31 MED ORDER — POTASSIUM CHLORIDE CRYS ER 20 MEQ PO TBCR
40.0000 meq | EXTENDED_RELEASE_TABLET | Freq: Once | ORAL | Status: AC
  Administered 2013-12-31: 40 meq via ORAL
  Filled 2013-12-31: qty 2

## 2013-12-31 MED ORDER — ISOSORBIDE MONONITRATE 15 MG HALF TABLET
15.0000 mg | ORAL_TABLET | Freq: Every day | ORAL | Status: DC
  Administered 2013-12-31 – 2014-01-03 (×4): 15 mg via ORAL
  Filled 2013-12-31 (×6): qty 1

## 2013-12-31 MED ORDER — FUROSEMIDE 10 MG/ML IJ SOLN
40.0000 mg | Freq: Once | INTRAMUSCULAR | Status: AC
  Administered 2013-12-31: 40 mg via INTRAVENOUS
  Filled 2013-12-31: qty 4

## 2013-12-31 MED ORDER — KCL IN DEXTROSE-NACL 20-5-0.9 MEQ/L-%-% IV SOLN
INTRAVENOUS | Status: DC
  Administered 2013-12-31 (×2): via INTRAVENOUS
  Filled 2013-12-31 (×3): qty 1000

## 2013-12-31 NOTE — Progress Notes (Signed)
3 Days Post-Op  Subjective: Still bloated.  Pain under better control. OOB yesterday  Objective: Vital signs in last 24 hours: Temp:  [97.5 F (36.4 C)-99.8 F (37.7 C)] 98.2 F (36.8 C) (02/18 0629) Pulse Rate:  [68-96] 68 (02/18 0629) Resp:  [16-24] 18 (02/18 0629) BP: (124-151)/(49-69) 128/49 mmHg (02/18 0629) SpO2:  [93 %-98 %] 96 % (02/18 0629) FiO2 (%):  [39 %-40 %] 40 % (02/18 0400) Weight:  [148 lb (67.132 kg)] 148 lb (67.132 kg) (02/17 1700)    Intake/Output from previous day: 02/17 0701 - 02/18 0700 In: 1840 [P.O.:240; I.V.:1600] Out: 1070 [Urine:1050; Stool:20] Intake/Output this shift:    Incision/Wound:wound open /clean.   Ostomy viable with 50 cc liquid in bag.  Distended but soft.  Lab Results:   Recent Labs  12/30/13 0322 12/31/13 0600  WBC 6.1 6.1  HGB 10.3* 9.8*  HCT 30.2* 28.4*  PLT 170 171   BMET  Recent Labs  12/29/13 0330 12/31/13 0600  NA 137 136*  K 3.7 3.1*  CL 103 103  CO2 23 26  GLUCOSE 88 133*  BUN 6 3*  CREATININE 0.52 0.43*  CALCIUM 7.3* 7.6*   PT/INR  Recent Labs  12/30/13 0322 12/31/13 0600  LABPROT 20.5* 20.8*  INR 1.82* 1.85*   ABG No results found for this basename: PHART, PCO2, PO2, HCO3,  in the last 72 hours  Studies/Results: No results found.  Anti-infectives: Anti-infectives   Start     Dose/Rate Route Frequency Ordered Stop   12/29/13 0000  ertapenem (INVANZ) 1 g in sodium chloride 0.9 % 50 mL IVPB     1 g 100 mL/hr over 30 Minutes Intravenous Every 24 hours 12/28/13 0855     12/28/13 0200  ertapenem (INVANZ) 1 g in sodium chloride 0.9 % 50 mL IVPB  Status:  Discontinued     1 g 100 mL/hr over 30 Minutes Intravenous Every 24 hours 12/28/13 0150 12/28/13 1141   12/28/13 0145  piperacillin-tazobactam (ZOSYN) IVPB 3.375 g  Status:  Discontinued     3.375 g 12.5 mL/hr over 240 Minutes Intravenous 3 times per day 12/28/13 0142 12/28/13 0150   12/27/13 2330  ertapenem (INVANZ) 1 g in sodium chloride 0.9  % 50 mL IVPB     1 g 100 mL/hr over 30 Minutes Intravenous  Once 12/27/13 2327 12/28/13 0049   12/27/13 2315  ciprofloxacin (CIPRO) IVPB 400 mg  Status:  Discontinued     400 mg 200 mL/hr over 60 Minutes Intravenous  Once 12/27/13 2307 12/27/13 2327   12/27/13 2315  metroNIDAZOLE (FLAGYL) IVPB 500 mg  Status:  Discontinued     500 mg 100 mL/hr over 60 Minutes Intravenous  Once 12/27/13 2307 12/27/13 2327      Assessment/Plan: s/p Procedure(s) with comments: EXPLORATORY LAPAROTOMY (N/A) - Hartman's procedure with splenic flexure mobilization COLON RESECTION SIGMOID (N/A) COLOSTOMY (N/A) Replace K for hypokalemia Stay on clears Cont ABX OOB Bowel function not yet back yet. A FIB/CAD/HTN  Per medicine   LOS: 4 days    [name] A. 12/31/2013

## 2013-12-31 NOTE — Progress Notes (Signed)
[facility] PROGRESS NOTE  Bridget MCCRYSTAL [name]:[id number] DOB: August 13, 1946 DOA: 12/27/2013 PCP: [name], MD  Assessment/Plan: 68 y.o. female with PMH of HTN, HPL, CAD s/p CABG, a fib on coumadin, recently Dx with acute diverticulitis was on atx 3 weeks presented with presyncopal episode and episode of hypotension and found to have Diverticultis with localized perforation.   1. Perforated diverticulitis s/p exp lap, Hartmann procedure with splenic flexure mobilization -per surgery, on IV atx   2. A fib HR controlled; d/w surgery okay to resume coumarin  -resume coumadin per pharmacy consult; d/c SQ lovenox when INR theraputic    3. CAD s/p CBAG no acute cardiopulmonary symptoms;  -1/18: resume BB, NTG, holding ACE   4. Hypo Ka; replace Po, could not tolerate IV; recheck in AM   5. Urinary retention likely due to acute illness, pain, opioids; obtain bladder scan; decrease benadryl   Code Status: full Family Communication: d/w patient, her daughter (indicate person spoken with, relationship, and if by phone, the number) Disposition Plan: home pend clinical improvement    Consultants:  surgery  Procedures:  Lap exp  Antibiotics:  invanz  (indicate start date, and stop date if known)  HPI/Subjective: alert  Objective: Filed Vitals:   12/31/13 0827  BP:   Pulse:   Temp:   Resp: 16    Intake/Output Summary (Last 24 hours) at 12/31/13 1008 Last data filed at 12/31/13 0600  Gross per 24 hour  Intake   1840 ml  Output    870 ml  Net    970 ml   Filed Weights   12/28/13 0421 12/28/13 0938 12/30/13 1700  Weight: 67.132 kg (148 lb) 67.5 kg (148 lb 13 oz) 67.132 kg (148 lb)    Exam:   General:  alert  Cardiovascular: s1,s2 rrr  Respiratory: CTA BL  Abdomen: soft, nt colostomy   Musculoskeletal: no le edema   Data Reviewed: Basic Metabolic Panel:  Recent Labs Lab 12/27/13 2024 12/28/13 0201 12/29/13 0330 12/31/13 0600  NA 134* 132* 137  136*  K 4.5 3.6* 3.7 3.1*  CL 96 98 103 103  CO2 23 20 23 26   GLUCOSE 96 120* 88 133*  BUN 19 14 6  3*  CREATININE 0.70 0.52 0.52 0.43*  CALCIUM 8.9 7.6* 7.3* 7.6*  MG  --  1.6 2.0  --   PHOS  --  2.7 1.8*  --    Liver Function Tests:  Recent Labs Lab 12/27/13 2024 12/28/13 0201  AST 25 19  ALT 26 20  ALKPHOS 45 33*  BILITOT 0.4 0.4  PROT 6.8 5.4*  ALBUMIN 3.8 3.0*    Recent Labs Lab 12/27/13 2024  LIPASE 32   No results found for this basename: AMMONIA,  in the last 168 hours CBC:  Recent Labs Lab 12/27/13 2024 12/28/13 0201 12/29/13 0330 12/30/13 0322 12/31/13 0600  WBC 12.2* 11.6* 6.7 6.1 6.1  NEUTROABS 9.6*  --   --   --   --   HGB 13.9 11.6* 11.4* 10.3* 9.8*  HCT 39.4 33.0* 33.0* 30.2* 28.4*  MCV 90.2 89.2 92.4 92.9 91.6  PLT 245 196 190 170 171   Cardiac Enzymes:  Recent Labs Lab 12/28/13 0820 12/28/13 1330 12/28/13 1925  TROPONINI <0.30 <0.30 <0.30   BNP (last 3 results) No results found for this basename: PROBNP,  in the last 8760 hours CBG: No results found for this basename: GLUCAP,  in the last 168 hours  Recent Results (from the past 240  hour(s))  CULTURE, BLOOD (ROUTINE X 2)     Status: None   Collection Time    12/28/13  1:35 AM      Result Value Ref Range Status   Specimen Description BLOOD ARM LEFT   Final   Special Requests     Final   Value: BOTTLES DRAWN AEROBIC AND ANAEROBIC BLUE 10CC RED 5CC   Culture  Setup Time     Final   Value: 12/28/2013 13:28     Performed at [facility]   Culture     Final   Value:        BLOOD CULTURE RECEIVED NO GROWTH TO DATE CULTURE WILL BE HELD FOR 5 DAYS BEFORE ISSUING A FINAL NEGATIVE REPORT     Performed at [facility]   Report Status PENDING   Incomplete  CULTURE, BLOOD (ROUTINE X 2)     Status: None   Collection Time    12/28/13  2:05 AM      Result Value Ref Range Status   Specimen Description BLOOD HAND LEFT   Final   Special Requests BOTTLES DRAWN AEROBIC ONLY  10CC   Final   Culture  Setup Time     Final   Value: 12/28/2013 13:28     Performed at [facility]   Culture     Final   Value:        BLOOD CULTURE RECEIVED NO GROWTH TO DATE CULTURE WILL BE HELD FOR 5 DAYS BEFORE ISSUING A FINAL NEGATIVE REPORT     Performed at [facility]   Report Status PENDING   Incomplete  MRSA PCR SCREENING     Status: None   Collection Time    12/28/13  1:23 PM      Result Value Ref Range Status   MRSA by PCR NEGATIVE  NEGATIVE Final   Comment:            The GeneXpert MRSA Assay (FDA     approved for NASAL specimens     only), is one component of a     comprehensive MRSA colonization     surveillance program. It is not     intended to diagnose MRSA     infection nor to guide or     monitor treatment for     MRSA infections.     Studies: No results found.  Scheduled Meds: . enoxaparin (LOVENOX) injection  40 mg Subcutaneous Q24H  . ertapenem  1 g Intravenous Q24H  . HYDROmorphone PCA 0.3 mg/mL   Intravenous 6 times per day  . ondansetron  4 mg Intravenous Once  . potassium chloride  10 mEq Intravenous Q1 Hr x 3  . sodium chloride  3 mL Intravenous Q12H   Continuous Infusions: . dextrose 5 % and 0.9% NaCl 100 mL/hr at 12/31/13 0025    Active Problems:   Paroxysmal atrial fibrillation, maintaining SR on coumadin    Long term (current) use of anticoagulants   CAD (coronary artery disease) with CABG   HTN (hypertension)   Diverticulitis with perforation   Sepsis   Perforated diverticulitis    Time spent: >35 minutes     [name]  [facility] Pager [phone]. If 7PM-7AM, please contact night-coverage at [email or web address], password [facility] 12/31/2013, 10:08 AM  LOS: 4 days
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
## 2014-01-01 DIAGNOSIS — I369 Nonrheumatic tricuspid valve disorder, unspecified: Secondary | ICD-10-CM

## 2014-01-01 DIAGNOSIS — R5381 Other malaise: Secondary | ICD-10-CM

## 2014-01-01 DIAGNOSIS — A419 Sepsis, unspecified organism: Secondary | ICD-10-CM

## 2014-01-01 LAB — BASIC METABOLIC PANEL
BUN: 3 mg/dL — ABNORMAL LOW (ref 6–23)
CO2: 31 mEq/L (ref 19–32)
Calcium: 7.6 mg/dL — ABNORMAL LOW (ref 8.4–10.5)
Chloride: 99 mEq/L (ref 96–112)
Creatinine, Ser: 0.54 mg/dL (ref 0.50–1.10)
GFR calc Af Amer: >90 mL/min (ref >90)
GFR calc non Af Amer: >90 mL/min (ref >90)
Glucose, Bld: 117 mg/dL — ABNORMAL HIGH (ref 70–99)
Potassium: 3.5 mEq/L — ABNORMAL LOW (ref 3.7–5.3)
Sodium: 139 mEq/L (ref 137–147)

## 2014-01-01 LAB — TYPE AND SCREEN
ABO/RH(D): O POS
Antibody Screen: NEGATIVE
Unit division: 0

## 2014-01-01 LAB — PROTIME-INR
INR: 2.11 — ABNORMAL HIGH (ref 0.00–1.49)
Prothrombin Time: 23 seconds — ABNORMAL HIGH (ref 11.6–15.2)

## 2014-01-01 LAB — PROCALCITONIN: Procalcitonin: 0.27 ng/mL

## 2014-01-01 MED ORDER — HYDROMORPHONE HCL PF 1 MG/ML IJ SOLN: 0.5000 mg | INTRAMUSCULAR | Status: DC | PRN

## 2014-01-01 MED ORDER — POTASSIUM CHLORIDE CRYS ER 20 MEQ PO TBCR
30.0000 meq | EXTENDED_RELEASE_TABLET | Freq: Once | ORAL | Status: AC
  Administered 2014-01-01: 30 meq via ORAL
  Filled 2014-01-01 (×2): qty 1

## 2014-01-01 MED ORDER — KCL IN DEXTROSE-NACL 20-5-0.9 MEQ/L-%-% IV SOLN
INTRAVENOUS | Status: DC
  Administered 2014-01-02: 20 mL via INTRAVENOUS
  Filled 2014-01-01 (×2): qty 1000

## 2014-01-01 MED ORDER — WARFARIN SODIUM 2.5 MG PO TABS
2.5000 mg | ORAL_TABLET | Freq: Once | ORAL | Status: AC
  Administered 2014-01-01: 2.5 mg via ORAL
  Filled 2014-01-01: qty 1

## 2014-01-01 MED ORDER — HYDROCODONE-ACETAMINOPHEN 5-325 MG PO TABS
1.0000 | ORAL_TABLET | ORAL | Status: DC | PRN
  Administered 2014-01-01: 2 via ORAL
  Administered 2014-01-01 (×2): 1 via ORAL
  Administered 2014-01-02 – 2014-01-03 (×7): 2 via ORAL
  Filled 2014-01-01 (×8): qty 2
  Filled 2014-01-01: qty 1
  Filled 2014-01-01: qty 2

## 2014-01-01 MED ORDER — ENSURE COMPLETE PO LIQD
237.0000 mL | Freq: Two times a day (BID) | ORAL | Status: DC
  Administered 2014-01-01 – 2014-01-03 (×4): 237 mL via ORAL

## 2014-01-01 NOTE — Evaluation (Signed)
Physical Therapy Evaluation Patient Details Name: [name] MRN: [id number] DOB: 1946/08/08 Today's Date: 01/01/2014 Time: 6962-9528 PT Time Calculation (min): 27 min  PT Assessment / Plan / Recommendation History of Present Illness  Patient is a 68 y.o. female admitted with diverticulitis with perforation now s/p exploratory laparotomy with [facility] procedure.  Clinical Impression  Patient presents with decreased independence with mobility due to deficits listed below.  She will benefit from skilled PT in the acute setting to allow decreased burden of care prior to d/c to STSNF due to pt lives alone.    PT Assessment  Patient needs continued PT services    Follow Up Recommendations  Other (comment);Supervision/Assistance - 24 hour    Does the patient have the potential to tolerate intense rehabilitation    N/A  Barriers to Discharge Decreased caregiver support      Equipment Recommendations  Rolling walker with 5" wheels    Recommendations for Other Services   None  Frequency Min 3X/week    Precautions / Restrictions Precautions Precautions: Fall Precaution Comments: open abdominal wound/ colostomy   Pertinent Vitals/Pain 7/10 in abdomen, RN aware      Mobility  Bed Mobility Overal bed mobility: Needs Assistance Bed Mobility: Rolling;Sidelying to Sit Rolling: Supervision Sidelying to sit: Min assist General bed mobility comments: assist to get feet off bed and lift trunk from sidelying, used bed rail to help herself roll Transfers Overall transfer level: Needs assistance Equipment used: Rolling walker (2 wheeled) Transfers: Sit to/from Stand Sit to Stand: Min assist General transfer comment: cues for hand placement Ambulation/Gait Ambulation/Gait assistance: Min assist Ambulation Distance (Feet): 160 Feet Assistive device: Rolling walker (2 wheeled) Gait Pattern/deviations: Step-through pattern;Shuffle;Trunk flexed;Decreased stride length General Gait  Details: assist needed to negotiate around obstacles in hallway, pt rushing due to pain in abdomen    Exercises     PT Diagnosis: Acute pain;Generalized weakness;Difficulty walking  PT Problem List: Decreased strength;Decreased activity tolerance;Decreased balance;Pain;Decreased knowledge of use of DME;Decreased mobility;Decreased safety awareness PT Treatment Interventions: Therapeutic exercise;DME instruction;Gait training;Balance training;Stair training;Functional mobility training;Therapeutic activities;Patient/family education     PT Goals(Current goals can be found in the care plan section) Acute Rehab PT Goals Patient Stated Goal: To go to rehab prior to returning home PT Goal Formulation: With patient/family Time For Goal Achievement: 01/15/14 Potential to Achieve Goals: Good  Visit Information  Last PT Received On: 01/01/14 Assistance Needed: +1 History of Present Illness: Patient is a 68 y.o. female admitted with diverticulitis with perforation now s/p exploratory laparotomy with [facility] procedure.       Prior Functioning  Home Living Family/patient expects to be discharged to:: Skilled nursing facility Living Arrangements: Alone Available Help at Discharge: Family;Available PRN/intermittently Type of Home: House Home Access: Stairs to enter [employer] of Steps: 2 Entrance Stairs-Rails: None Home Layout: One level Home Equipment: Other (comment) Additional Comments: reports has multiple items of medical equipment her mother used Prior Function Level of Independence: Independent Communication Communication: No difficulties    Cognition  Cognition Arousal/Alertness: Awake/alert Behavior During Therapy: WFL for tasks assessed/performed Overall Cognitive Status: Within Functional Limits for tasks assessed    Extremity/Trunk Assessment Lower Extremity Assessment Lower Extremity Assessment: Overall WFL for tasks assessed   Balance Balance Overall  balance assessment: Needs assistance Standing balance support: Bilateral upper extremity supported Standing balance-Leahy Scale: Poor Standing balance comment: assist with UE's on walker  End of Session PT - End of Session Activity Tolerance: Patient limited by fatigue;Patient limited by pain Patient  left: with call bell/phone within reach;with family/visitor present;with bed alarm set  GP     [facility] 01/01/2014, 4:41 PM Magda Kiel, [place] 01/01/2014

## 2014-01-01 NOTE — Consult Note (Signed)
WOC ostomy follow up Stoma type/location: LLQ, end colostomy  Stomal assessment/size: 2" round, budded from skin and edematous Peristomal assessment: intact, but some denudation at 11 oclock toward her midline wound where pouch had been leaking and not changed. Treatment options for stomal/peristomal skin:  Output brown fecal matter in pouch Ostomy pouching: 2pc. 2 3/4" pouch placed with daughter and patient watching minimally.  Pt did not want to look and daughter backed away from bed, got better during teaching they both did eventually look at stoma.   Education provided: reviewed care of stoma, peristomal skin, demonstrated pouch change.   Enrolled patient in [place] Discharge program: Yes, samples to be sent to daughter home per the patients request.  Will follow along with you Para March RN,CWOCN [phone]

## 2014-01-01 NOTE — Progress Notes (Signed)
[facility] for coumadin Indication: atrial fibrillation  Allergies  Allergen Reactions  . Crestor [Rosuvastatin]     Leg pain  . Diltiazem     Weakness on oral Dilt  . Lipitor [Atorvastatin]     Leg pain  . Penicillins Other (See Comments)    Blurred vision  . Phenergan [Promethazine Hcl] Other (See Comments)    Nervous Leg / Restless Leg Syndrome  . Promethazine Other (See Comments)    Uncontrolled leg shaking  . Sulfa Antibiotics Photosensitivity and Rash    Burning Rash   Labs:  Recent Labs  12/30/13 0322 12/31/13 0600 01/01/14 0336  HGB 10.3* 9.8*  --   HCT 30.2* 28.4*  --   PLT 170 171  --   APTT 46* 44*  --   LABPROT 20.5* 20.8* 23.0*  INR 1.82* 1.85* 2.11*  CREATININE  --  0.43* 0.54    Estimated Creatinine Clearance: 61.1 ml/min (by C-G formula based on Cr of 0.54).  Assessment: 68 yo lady s/p exp lap and Hartmann procedure for perforated diverticulitis to restart coumadin for afib.  INR today 2.11.   Goal of Therapy:  INR 2-3 Monitor platelets by anticoagulation protocol: Yes   Plan:  Coumadin 2.5 mg today Daily protime/INR Lovenox discontinued  Thank you. Anette Guarneri, PharmD [phone] 01/01/2014,2:05 PM

## 2014-01-01 NOTE — H&P (Signed)
Scheduled Meds: . ertapenem  1 g Intravenous Q24H  . feeding supplement (ENSURE COMPLETE)  237 mL Oral BID BM  . isosorbide mononitrate  15 mg Oral Daily  . metoprolol tartrate  25 mg Oral BID  . ondansetron  4 mg Intravenous Once  . sodium chloride  3 mL Intravenous Q12H  . Warfarin - Pharmacist Dosing Inpatient   Does not apply q1800   Continuous Infusions: . dextrose 5 % and 0.9 % NaCl with KCl 20 mEq/L     PRN Meds:.HYDROcodone-acetaminophen, HYDROmorphone (DILAUDID) injection, ondansetron (ZOFRAN) IV, ondansetron

## 2014-01-01 NOTE — Progress Notes (Signed)
Echo Lab  2D Echocardiogram completed.  [name], RDCS 01/01/2014 1:39 PM

## 2014-01-01 NOTE — Progress Notes (Signed)
Pt noted wheezing, crackles noted over right side,O2 sat 96% on 1L O2,, swelling noted to bilateral upper extremities R>L,non pitting and +1 to LE,deneis SOB but non productive cough noted,encouraged to use IS.MD on call notified,new order received and carried out.

## 2014-01-01 NOTE — Clinical Social Work Psychosocial (Signed)
Clinical Social Work Department BRIEF PSYCHOSOCIAL ASSESSMENT 01/01/2014  Patient:  DENAY, PLEITEZ     Account Number:  [id number]     Admit date:  12/27/2013  Clinical Social Worker:  Daiva Huge  Date/Time:  01/01/2014 01:08 PM  Referred by:  RN  Date Referred:  01/01/2014 Referred for  SNF Placement   Other Referral:   Interview type:  Patient Other interview type:   Daughter Hinton Dyer at bedside    PSYCHOSOCIAL DATA Living Status:  ALONE Admitted from facility:   Level of care:   Primary support name:  DaughterHinton Dyer [phone] Primary support relationship to patient:  FAMILY Degree of support available:   good    CURRENT CONCERNS Current Concerns  Post-Acute Placement   Other Concerns:    SOCIAL WORK ASSESSMENT / PLAN Met with patient and daughter at bedside- patient reports having surgery that has left her with a "nasty wound" that she does not feel comfortable caring for-  "it's large and open and needs dresing changed 2 times a day." She is open to considering SNF for short term rehab and nursing/wound care. Patient reports living in the [place] area and would prefer to be in [place] area-   Assessment/plan status:  Other - See comment Other assessment/ plan:   [place] and [place] for SNF search   Information/referral to community resources:   SNF    PATIENT'S/FAMILY'S RESPONSE TO PLAN OF CARE: Patient and family were very appreicaitive and receptive to CSW assistance-  Patient reports being very independent- walking/exercising daily prior to this hospitalization. She is feeling better and optimistic for healing and strengthening post operatively.

## 2014-01-01 NOTE — Progress Notes (Signed)
Pt requesting PICC line to prevent needle sticks for blood draws Tolerating diet of fulls but not eating much +air in bag  Alert, nad Soft, expected TTP. Ostomy functioning.   Will defer picc to primary team Can start coumadin fulls until have more stool in bag  [name]. [name], MD, FACS General, Bariatric, & Minimally Invasive Surgery [facility], [place]

## 2014-01-01 NOTE — Progress Notes (Signed)
[facility] PROGRESS NOTE  [name] DJM:[id number] DOB: [birth year] DOA: 12/27/2013 PCP: [name], MD  Assessment/Plan: 68 y.o. female with PMH of HTN, HPL, CAD s/p CABG, a fib on coumadin, recently Dx with acute diverticulitis was on atx 3 weeks presented with presyncopal episode and episode of hypotension and found to have Diverticultis with localized perforation.   1. Perforated diverticulitis s/p exp lap, Hartmann procedure with splenic flexure mobilization -per surgery, on IV atx   2. A fib HR controlled; resume coumarin d/c SQ lovenox  3. CAD s/p CBAG no acute cardiopulmonary symptoms;  -1/18: resume BB, NTG, holding ACE  -1/19: CXR congestion; given lasix 40x1; decrease IVF; clinically euvolemic; obtain echo   4. Hypo Ka; replace Po;   5. Urinary retention likely due to acute illness, pain, opioids; obtain bladder scan; decrease benadryl   Code Status: full Family Communication: d/w patient, her daughter (indicate person spoken with, relationship, and if by phone, the number) Disposition Plan: home pend clinical improvement    Consultants:  surgery  Procedures:  Lap exp  Antibiotics:  invanz  (indicate start date, and stop date if known)  HPI/Subjective: alert  Objective: Filed Vitals:   01/01/14 0926  BP: 137/57  Pulse: 77  Temp:   Resp:     Intake/Output Summary (Last 24 hours) at 01/01/14 1043 Last data filed at 01/01/14 0753  Gross per 24 hour  Intake      0 ml  Output   3950 ml  Net  -3950 ml   Filed Weights   12/28/13 0421 12/28/13 0938 12/30/13 1700  Weight: 67.132 kg (148 lb) 67.5 kg (148 lb 13 oz) 67.132 kg (148 lb)    Exam:   General:  alert  Cardiovascular: s1,s2 rrr  Respiratory: CTA BL  Abdomen: soft, nt colostomy   Musculoskeletal: no le edema   Data Reviewed: Basic Metabolic Panel:  Recent Labs Lab 12/27/13 2024 12/28/13 0201 12/29/13 0330 12/31/13 0600 01/01/14 0336  NA 134* 132* 137 136* 139   K 4.5 3.6* 3.7 3.1* 3.5*  CL 96 98 103 103 99  CO2 23 20 23 26 31   GLUCOSE 96 120* 88 133* 117*  BUN 19 14 6  3* 3*  CREATININE 0.70 0.52 0.52 0.43* 0.54  CALCIUM 8.9 7.6* 7.3* 7.6* 7.6*  MG  --  1.6 2.0  --   --   PHOS  --  2.7 1.8*  --   --    Liver Function Tests:  Recent Labs Lab 12/27/13 2024 12/28/13 0201  AST 25 19  ALT 26 20  ALKPHOS 45 33*  BILITOT 0.4 0.4  PROT 6.8 5.4*  ALBUMIN 3.8 3.0*    Recent Labs Lab 12/27/13 2024  LIPASE 32   No results found for this basename: AMMONIA,  in the last 168 hours CBC:  Recent Labs Lab 12/27/13 2024 12/28/13 0201 12/29/13 0330 12/30/13 0322 12/31/13 0600  WBC 12.2* 11.6* 6.7 6.1 6.1  NEUTROABS 9.6*  --   --   --   --   HGB 13.9 11.6* 11.4* 10.3* 9.8*  HCT 39.4 33.0* 33.0* 30.2* 28.4*  MCV 90.2 89.2 92.4 92.9 91.6  PLT 245 196 190 170 171   Cardiac Enzymes:  Recent Labs Lab 12/28/13 0820 12/28/13 1330 12/28/13 1925  TROPONINI <0.30 <0.30 <0.30   BNP (last 3 results) No results found for this basename: PROBNP,  in the last 8760 hours CBG: No results found for this basename: GLUCAP,  in the last 168 hours  Recent Results (from the past 240 hour(s))  CULTURE, BLOOD (ROUTINE X 2)     Status: None   Collection Time    12/28/13  1:35 AM      Result Value Ref Range Status   Specimen Description BLOOD ARM LEFT   Final   Special Requests     Final   Value: BOTTLES DRAWN AEROBIC AND ANAEROBIC BLUE 10CC RED 5CC   Culture  Setup Time     Final   Value: 12/28/2013 13:28     Performed at [facility]   Culture     Final   Value:        BLOOD CULTURE RECEIVED NO GROWTH TO DATE CULTURE WILL BE HELD FOR 5 DAYS BEFORE ISSUING A FINAL NEGATIVE REPORT     Performed at [facility]   Report Status PENDING   Incomplete  CULTURE, BLOOD (ROUTINE X 2)     Status: None   Collection Time    12/28/13  2:05 AM      Result Value Ref Range Status   Specimen Description BLOOD HAND LEFT   Final   Special  Requests BOTTLES DRAWN AEROBIC ONLY 10CC   Final   Culture  Setup Time     Final   Value: 12/28/2013 13:28     Performed at [facility]   Culture     Final   Value:        BLOOD CULTURE RECEIVED NO GROWTH TO DATE CULTURE WILL BE HELD FOR 5 DAYS BEFORE ISSUING A FINAL NEGATIVE REPORT     Performed at [facility]   Report Status PENDING   Incomplete  MRSA PCR SCREENING     Status: None   Collection Time    12/28/13  1:23 PM      Result Value Ref Range Status   MRSA by PCR NEGATIVE  NEGATIVE Final   Comment:            The GeneXpert MRSA Assay (FDA     approved for NASAL specimens     only), is one component of a     comprehensive MRSA colonization     surveillance program. It is not     intended to diagnose MRSA     infection nor to guide or     monitor treatment for     MRSA infections.     Studies: Dg Chest Port 1 View  12/31/2013   CLINICAL DATA:  wheezing and swelling in arms  EXAM: PORTABLE CHEST - 1 VIEW  COMPARISON:  None.  FINDINGS: Sternotomy wires overlie normal cardiac silhouette. There is interval increased interstitial edema pattern compared to prior. There is some focality in the right lower lobe at the cardiophrenic angle.  IMPRESSION: 1. Interval increased interstitial pattern suggests interstitial edema. 2. Superimposed airspace disease in the right lower lobe representing pulmonary edema or infection.   Electronically Signed   By: Suzy Bouchard M.D.   On: 12/31/2013 23:43    Scheduled Meds: . enoxaparin (LOVENOX) injection  40 mg Subcutaneous Q24H  . ertapenem  1 g Intravenous Q24H  . feeding supplement (ENSURE COMPLETE)  237 mL Oral BID BM  . isosorbide mononitrate  15 mg Oral Daily  . metoprolol tartrate  25 mg Oral BID  . ondansetron  4 mg Intravenous Once  . sodium chloride  3 mL Intravenous Q12H  . Warfarin - Pharmacist Dosing Inpatient   Does not apply q1800   Continuous Infusions: .  dextrose 5 % and 0.9 % NaCl with KCl 20 mEq/L       Active Problems:   Paroxysmal atrial fibrillation, maintaining SR on coumadin    Long term (current) use of anticoagulants   CAD (coronary artery disease) with CABG   HTN (hypertension)   Diverticulitis with perforation   Sepsis   Perforated diverticulitis    Time spent: >35 minutes     [name]  [facility] Pager [phone]. If 7PM-7AM, please contact night-coverage at [email or web address], password [email or web address] 01/01/2014, 10:43 AM  LOS: 5 days

## 2014-01-01 NOTE — Progress Notes (Addendum)
4 Days Post-Op  Subjective: Pt states she has been tolerating her diet without GI distress.  She currently has no complaints, and states her breathing feels better from last night, when she was given lasix for fluid retention.  Pt states she is able to walk 2 laps each day and as been gradually increasing her endurance.  She reports doing her spirometry often and is able to increase from 750 to 1000 with some coaching.  She still reports some mild tenderness of the wound site.  Objective: Vital signs in last 24 hours: Temp:  [97.3 F (36.3 C)-99.2 F (37.3 C)] 97.3 F (36.3 C) (02/19 0800) Pulse Rate:  [66-80] 78 (02/19 0800) Resp:  [16-24] 20 (02/19 0800) BP: (126-141)/(51-64) 126/60 mmHg (02/19 0800) SpO2:  [93 %-98 %] 96 % (02/19 0800) FiO2 (%):  [40 %] 40 % (02/19 0745) Last BM Date: 12/31/13  Intake/Output from previous day: 02/18 0701 - 02/19 0700 In: -  Out: 3950 [Urine:3850; Stool:100] Intake/Output this shift:    PE: Gen:  Alert, NAD, pleasant Card:  RRR, no M/G/R heard, Normal S1 and S2 Pulm:  CTA, no W/R/R, non tender. Abd: Soft, NT/ND, +BS, Wound covered with dressing.  Small amount of bleeding from inferior portion of wound.  No discharge or discoloration of borders.  Colostomy inflated with gas. Pt also has a small amount of soft feces present at ostomy opening with small amount of liquid stool in bag. Ext:  Mild peripheral edema to UEs and LEs.  Distal pulses, motor, and sensation in tact.  Lab Results:   Recent Labs  12/30/13 0322 12/31/13 0600  WBC 6.1 6.1  HGB 10.3* 9.8*  HCT 30.2* 28.4*  PLT 170 171   BMET  Recent Labs  12/31/13 0600 01/01/14 0336  NA 136* 139  K 3.1* 3.5*  CL 103 99  CO2 26 31  GLUCOSE 133* 117*  BUN 3* 3*  CREATININE 0.43* 0.54  CALCIUM 7.6* 7.6*   PT/INR  Recent Labs  12/31/13 0600 01/01/14 0336  LABPROT 20.8* 23.0*  INR 1.85* 2.11*   CMP     Component Value Date/Time   NA 139 01/01/2014 0336   K 3.5*  01/01/2014 0336   CL 99 01/01/2014 0336   CO2 31 01/01/2014 0336   GLUCOSE 117* 01/01/2014 0336   BUN 3* 01/01/2014 0336   CREATININE 0.54 01/01/2014 0336   CREATININE 0.69 09/05/2013 0938   CALCIUM 7.6* 01/01/2014 0336   PROT 5.4* 12/28/2013 0201   ALBUMIN 3.0* 12/28/2013 0201   AST 19 12/28/2013 0201   ALT 20 12/28/2013 0201   ALKPHOS 33* 12/28/2013 0201   BILITOT 0.4 12/28/2013 0201   GFRNONAA >90 01/01/2014 0336   GFRAA >90 01/01/2014 0336   Lipase     Component Value Date/Time   LIPASE 32 12/27/2013 2024       Studies/Results: Dg Chest Port 1 View  12/31/2013   CLINICAL DATA:  wheezing and swelling in arms  EXAM: PORTABLE CHEST - 1 VIEW  COMPARISON:  None.  FINDINGS: Sternotomy wires overlie normal cardiac silhouette. There is interval increased interstitial edema pattern compared to prior. There is some focality in the right lower lobe at the cardiophrenic angle.  IMPRESSION: 1. Interval increased interstitial pattern suggests interstitial edema. 2. Superimposed airspace disease in the right lower lobe representing pulmonary edema or infection.   Electronically Signed   By: Suzy Bouchard M.D.   On: 12/31/2013 23:43    Anti-infectives: Anti-infectives   Start  Dose/Rate Route Frequency Ordered Stop   12/29/13 0000  ertapenem (INVANZ) 1 g in sodium chloride 0.9 % 50 mL IVPB     1 g 100 mL/hr over 30 Minutes Intravenous Every 24 hours 12/28/13 0855     12/28/13 0200  ertapenem (INVANZ) 1 g in sodium chloride 0.9 % 50 mL IVPB  Status:  Discontinued     1 g 100 mL/hr over 30 Minutes Intravenous Every 24 hours 12/28/13 0150 12/28/13 1141   12/28/13 0145  piperacillin-tazobactam (ZOSYN) IVPB 3.375 g  Status:  Discontinued     3.375 g 12.5 mL/hr over 240 Minutes Intravenous 3 times per day 12/28/13 0142 12/28/13 0150   12/27/13 2330  ertapenem (INVANZ) 1 g in sodium chloride 0.9 % 50 mL IVPB     1 g 100 mL/hr over 30 Minutes Intravenous  Once 12/27/13 2327 12/28/13 0049   12/27/13  2315  ciprofloxacin (CIPRO) IVPB 400 mg  Status:  Discontinued     400 mg 200 mL/hr over 60 Minutes Intravenous  Once 12/27/13 2307 12/27/13 2327   12/27/13 2315  metroNIDAZOLE (FLAGYL) IVPB 500 mg  Status:  Discontinued     500 mg 100 mL/hr over 60 Minutes Intravenous  Once 12/27/13 2307 12/27/13 2327       Assessment/Plan POD#4 Exploratory laparotomy Hartman's procedure with splenic flexure mobilization. Sigmoid colon resection w/ colostomy (Dr. [name])  Deconditioning -Transition pain management to oral and IV push as she is tolerating oral intake  -Advance diet to full liquids. Colbert Ewing 2/15---->  -Ostomy care, WOC consult  -BID wet to dry dressing changes  -Mobilize  -IS(1039ml)  -IV hydration [place] should discontinue lovenox as INR > 2.0, Continue SCDs  -Monitor wound bleeding as pt has resumed her Coumadin   LOS: 5 days    Lollie Marrow PA-S 01/01/2014, 9:25 AM [facility] [id number]  ----------------------------------------------------------------------------------------------------------- General Surgery PA Preceptor Note:  I agree with the above PA students findings as above.  Made changes as needed above.  Add PT/OT for deconditioning.  Coralie Keens, PA-C General Surgery [facility] Pager: [phone] Office: [phone]

## 2014-01-01 NOTE — Social Work (Addendum)
Clinical Social Work Department CLINICAL SOCIAL WORK PLACEMENT NOTE 01/01/2014  Patient:  [name], [name]  Account Number:  [id number] Admit date:  12/27/2013  Clinical Social Worker:  Daiva Huge  Date/time:  01/01/2014 01:19 PM  Clinical Social Work is seeking post-discharge placement for this patient at the following level of care:   SKILLED NURSING   (*CSW will update this form in Epic as items are completed)   01/01/2014  Patient/family provided with [place] Department of Clinical Social Work's list of facilities offering this level of care within the geographic area requested by the patient (or if unable, by the patient's family).  01/01/2014  Patient/family informed of their freedom to choose among providers that offer the needed level of care, that participate in Medicare, Medicaid or managed care program needed by the patient, have an available bed and are willing to accept the patient.  01/01/2014  Patient/family informed of [facility]' ownership interest in [facility], as well as of the fact that they are under no obligation to receive care at this facility.  PASARR submitted to EDS on 01/01/2014 PASARR number received from EDS on 01/01/2014  FL2 transmitted to all facilities in geographic area requested by pt/family on  01/01/2014 FL2 transmitted to all facilities within larger geographic area on   Patient informed that his/her managed care company has contracts with or will negotiate with  certain facilities, including the following:     Patient/family informed of bed offers received:   Patient chooses bed at  Physician recommends and patient chooses bed at    Patient to be transferred to [facility] on 01/03/14- Blima Rich, [place] Patient to be transferred to facility by Daughter Ethlyn Daniels in personal vehicle- Blima Rich, [place]   The following physician request were entered in Epic:   Additional Comments: Eduard Clos, MSW,  [place]

## 2014-01-01 NOTE — Progress Notes (Signed)
01-01-14 Spoke with  patient and daughter Hinton Dyer ( [phone]) at bedside regarding home health . Patient would prefer to go to SNF for rehab and wound care before returning home.  PA aware and will order PT/OT consults . Marcie Bal with social work aware [phone]. Magdalen Spatz RN BSN [phone]

## 2014-01-02 LAB — PROTIME-INR
INR: 2.14 — ABNORMAL HIGH (ref 0.00–1.49)
Prothrombin Time: 23.2 seconds — ABNORMAL HIGH (ref 11.6–15.2)

## 2014-01-02 MED ORDER — WARFARIN 1.25 MG HALF TABLET
1.2500 mg | ORAL_TABLET | Freq: Once | ORAL | Status: AC
  Administered 2014-01-02: 1.25 mg via ORAL
  Filled 2014-01-02: qty 1

## 2014-01-02 NOTE — Progress Notes (Signed)
[place] for coumadin Indication: atrial fibrillation  Allergies  Allergen Reactions  . Crestor [Rosuvastatin]     Leg pain  . Diltiazem     Weakness on oral Dilt  . Lipitor [Atorvastatin]     Leg pain  . Penicillins Other (See Comments)    Blurred vision  . Phenergan [Promethazine Hcl] Other (See Comments)    Nervous Leg / Restless Leg Syndrome  . Promethazine Other (See Comments)    Uncontrolled leg shaking  . Sulfa Antibiotics Photosensitivity and Rash    Burning Rash   Labs:  Recent Labs  12/31/13 0600 01/01/14 0336 01/02/14 0348  HGB 9.8*  --   --   HCT 28.4*  --   --   PLT 171  --   --   APTT 44*  --   --   LABPROT 20.8* 23.0* 23.2*  INR 1.85* 2.11* 2.14*  CREATININE 0.43* 0.54  --     Estimated Creatinine Clearance: 61.1 ml/min (by C-G formula based on Cr of 0.54).  Assessment: 68 yo lady s/p exp lap and Hartmann procedure for perforated diverticulitis to restart coumadin for afib.  INR today 2.14   Goal of Therapy:  INR 2-3 Monitor platelets by anticoagulation protocol: Yes   Plan:  Coumadin 1.25 mg today Daily protime/INR  Thank you. Excell Seltzer, PharmD [phone] 01/02/2014,10:03 AM

## 2014-01-02 NOTE — Discharge Summary (Addendum)
Physician Discharge Summary  GAGANDEEP KOSSMAN NID:[id number] DOB: 1946/03/28 DOA: 12/27/2013  PCP: [name], MD  Admit date: 12/27/2013 Discharge date: 01/02/2014  Time spent: >35 minutes  Recommendations for Outpatient Follow-up:  F/u with surgery in 1-2 weeks F/u with PCP in 1-2 weeks post rehab  Discharge Diagnoses:  Active Problems:   Paroxysmal atrial fibrillation, maintaining SR on coumadin    Long term (current) use of anticoagulants   CAD (coronary artery disease) with CABG   HTN (hypertension)   Diverticulitis with perforation   Sepsis   Perforated diverticulitis   Discharge Condition: stable   Diet recommendation: heart healthy  Filed Weights   12/28/13 0421 12/28/13 0938 12/30/13 1700  Weight: 67.132 kg (148 lb) 67.5 kg (148 lb 13 oz) 67.132 kg (148 lb)    History of present illness:   68 y.o. female with PMH of HTN, HPL, CAD s/p CABG, a fib on coumadin, recently Dx with acute diverticulitis was on atx 3 weeks presented with presyncopal episode and episode of hypotension and found to have Diverticultis with localized perforation.   Hospital Course:  1. Perforated diverticulitis s/p exp lap, Hartmann procedure with splenic flexure mobilization  -improved; per surgery on IV atx last dose 2/21  -per surgery daily wet to dry dressing changes BID, regular Routine colostomy care  2. A fib HR controlled; resumed coumarin; HR controlled; hold pletal due to increased risk of bleeding  3. CAD s/p CBAG no acute cardiopulmonary symptoms;  -resume BB, NTG; 1/19: CXR congestion; given lasix 40x1; resolved;  -she is clinically euvolemic; echo: LVEF 60%; monitor off diuretics  4. Hypo Ka; replaced Po;  5. Urinary retention likely due to acute illness, pain, opioids; decrease benadryl; resolved  6. Upper thigh fungal cellulitis; started on diflucan per surgery;  -need to close monitor INR while on antifungals    Procedures:  Lap surgery  (i.e. Studies not automatically  included, echos, thoracentesis, etc; not x-rays)  Consultations:  Surgery   Discharge Exam: Filed Vitals:   01/02/14 0515  BP: 141/55  Pulse: 73  Temp: 98.4 F (36.9 C)  Resp: 20    General: alert Cardiovascular: s1,s2 rrr Respiratory: CTA BL  Discharge Instructions   Future Appointments Provider Department Dept Phone   03/16/2014 1:30 PM Troy Sine, MD [facility] [phone]       Medication List    STOP taking these medications       cilostazol 100 MG tablet  Commonly known as:  PLETAL      TAKE these medications       acetaminophen 500 MG tablet  Commonly known as:  TYLENOL  Take 500 mg by mouth 2 (two) times daily as needed for pain.     ammonium lactate 12 % lotion  Commonly known as:  LAC-HYDRIN  Apply 1 application topically as needed for dry skin.     aspirin EC 81 MG tablet  Take 81 mg by mouth at bedtime.     CALTRATE 600+D 600-800 MG-UNIT Tabs  Generic drug:  Calcium Carb-Cholecalciferol  Take 1 tablet by mouth daily.     docusate sodium 100 MG capsule  Commonly known as:  COLACE  Take 100 mg by mouth daily.     ezetimibe 10 MG tablet  Commonly known as:  ZETIA  Take 10 mg by mouth at bedtime.     fluconazole 150 MG tablet  Commonly known as:  DIFLUCAN  Take 1 tablet (150 mg total) by mouth daily.  hydrocortisone valerate cream 0.2 %  Commonly known as:  WESTCORT  Apply 1 application topically 3 (three) times a week. On random days - for eczema     isosorbide mononitrate 30 MG 24 hr tablet  Commonly known as:  IMDUR  Take 30 mg by mouth daily.     lisinopril 20 MG tablet  Commonly known as:  PRINIVIL,ZESTRIL  Take 10-20 mg by mouth 2 (two) times daily. Take 1 tablet (20 mg) every morning and 1/2 tablet (10 mg) every night     metoprolol succinate 50 MG 24 hr tablet  Commonly known as:  TOPROL-XL  Take 25-50 mg by mouth 2 (two) times daily. Take 1/2 tablet ( 25mg )  in morning and 1 tablet (50 mg) in the  evening.     nitroGLYCERIN 0.4 MG SL tablet  Commonly known as:  NITROSTAT  Place 1 tablet (0.4 mg total) under the tongue every 5 (five) minutes x 3 doses as needed for chest pain.     omega-3 acid ethyl esters 1 G capsule  Commonly known as:  LOVAZA  Take 1 g by mouth daily at 12 noon.     pyridOXINE 100 MG tablet  Commonly known as:  VITAMIN B-6  Take 100 mg by mouth daily.     simvastatin 20 MG tablet  Commonly known as:  ZOCOR  Take 20 mg by mouth at bedtime.     SYSTANE OP  Place 1 drop into both eyes daily.     Vitamin D 400 UNITS capsule  Take 400 Units by mouth daily.     warfarin 2.5 MG tablet  Commonly known as:  COUMADIN  Take 1.25-2.5 mg by mouth daily. Take 1/2 tablet (1.25 mg) on Monday and Friday, take 1 tablet (2.5 mg) on all other days       Allergies  Allergen Reactions  . Crestor [Rosuvastatin]     Leg pain  . Diltiazem     Weakness on oral Dilt  . Lipitor [Atorvastatin]     Leg pain  . Penicillins Other (See Comments)    Blurred vision  . Phenergan [Promethazine Hcl] Other (See Comments)    Nervous Leg / Restless Leg Syndrome  . Promethazine Other (See Comments)    Uncontrolled leg shaking  . Sulfa Antibiotics Photosensitivity and Rash    Burning Rash      The results of significant diagnostics from this hospitalization (including imaging, microbiology, ancillary and laboratory) are listed below for reference.    Significant Diagnostic Studies: Ct Abdomen Pelvis Wo Contrast  12/27/2013   CLINICAL DATA:  Low abdominal pain, nausea, constipation  EXAM: CT ABDOMEN AND PELVIS WITHOUT CONTRAST  TECHNIQUE: Multidetector CT imaging of the abdomen and pelvis was performed following the standard protocol without intravenous contrast.  COMPARISON:  Recent prior CT abdomen/ pelvis 12/03/2013  FINDINGS: Lower Chest: The lung bases are clear. Visualized cardiac structures are within normal limits for size. No pericardial effusion. Small hiatal hernia.  Atherosclerotic calcification noted in the visualized coronary arteries.  Abdomen: Unenhanced CT was performed per clinician order. Lack of IV contrast limits sensitivity and specificity, especially for evaluation of abdominal/pelvic solid viscera. Within these limitations, unremarkable CT appearance of the stomach, duodenum, spleen, adrenal glands, pancreas. Stable 1 cm circumscribed hypo attenuating focus in hepatic segment 2/3. No new focal hepatic lesion. Punctate calcification in the right hepatic lobe suggest sequelae of old granulomatous disease. Gallbladder is unremarkable. No intra or extrahepatic biliary ductal dilatation.  Unremarkable appearance of  the bilateral kidneys. No focal solid lesion, hydronephrosis or nephrolithiasis.  Colonic diverticular disease. Interstitial inflammatory stranding is noted about the proximal sigmoid colon and an adjacent loop of distal ileum. There is a small amount of extraluminal air position between the loop of small bowel and the colon. No definite free fluid or abscess collection. The inflammatory change appears to be centered around the sigmoid colon. The small bowel changes are likely reactive. The remainder of the colon is unremarkable. No bowel obstruction.  Pelvis: Heterogeneous uterus with internal calcifications likely reflecting degenerated fibroids. No free fluid or suspicious adenopathy.  Bones/Soft Tissues: No acute fracture or aggressive appearing lytic or blastic osseous lesion.  Vascular: Atherosclerotic vascular disease without significant stenosis or aneurysmal dilatation.  IMPRESSION: 1. Acute sigmoid diverticulitis complicated by small contained perforation and secondary inflammatory changes involving an adjacent loop of ileum. No drainable fluid collection or abscess identified. 2. Atherosclerosis including coronary artery disease. 3. Hiatal hernia. These results were called by telephone at the time of interpretation on 12/27/2013 at 11:04 PM to Dr.  Carmin Muskrat , who verbally acknowledged these results.   Electronically Signed   By: Jacqulynn Cadet M.D.   On: 12/27/2013 23:05   Dg Chest 2 View  12/07/2013   CLINICAL DATA:  Chest pain.  Coronary artery disease.  EXAM: CHEST  2 VIEW  COMPARISON:  12/06/2013  FINDINGS: The heart size and mediastinal contours are within normal limits. Both lungs are clear. Prior CABG again noted. Old lower thoracic vertebral body compression fractures are stable.  IMPRESSION: No active cardiopulmonary disease.   Electronically Signed   By: Earle Gell M.D.   On: 12/07/2013 07:44   Dg Chest 2 View  12/06/2013   CLINICAL DATA:  Shortness of breath.  EXAM: CHEST  2 VIEW  COMPARISON:  02/21/2013.  10/18/2007.  FINDINGS: Mediastinum and hilar structures are normal. Mild cardiomegaly with mild pulmonary vascular prominence and interstitial prominence noted. Kerley B-lines are noted. Tiny left pleural effusion cannot be excluded. These findings are consistent with mild congestive heart failure with pulmonary interstitial edema. Prior CABG. Diffuse osteopenia degenerative change thoracic spine. Moderate lower thoracic vertebral body compression fracture noted, this is new from 2008. No retropulsed fragments.  IMPRESSION: 1. Mild congestive heart failure and interstitial pulmonary edema. Prior CABG. 2. Lower thoracic spine vertebral body moderate compression fracture, new from 2008.   Electronically Signed   By: Marcello Moores  Register   On: 12/06/2013 17:41   Ct Abdomen Pelvis W Contrast  12/03/2013   CLINICAL DATA:  Left lower quadrant pain.  EXAM: CT ABDOMEN AND PELVIS WITH CONTRAST  TECHNIQUE: Multidetector CT imaging of the abdomen and pelvis was performed using the standard protocol following bolus administration of intravenous contrast.  CONTRAST:  163mL OMNIPAQUE IOHEXOL 300 MG/ML  SOLN  COMPARISON:  CT scans dated 07/23/2013 and 07/13/2009  FINDINGS: The patient has focal diverticulitis of the distal descending/sigmoid  colon junction in the left lower quadrant. There is what is either a distended inflamed diverticulum or a small diverticular abscess containing fluid and air on image number 51 of series 2. There are numerous diverticula in the colon primarily in the descending and sigmoid regions.  Liver, spleen, pancreas, adrenal glands, and kidneys are normal except for 2 tiny cysts in the liver and a single tiny cyst in the lower pole of the left kidney. There is a 10 mm cyst in the anterior aspect of the left lobe of the liver and a 8 mm cyst in  the posterior aspect of the right lobe of the liver and a 6 mm cyst in the lower pole of the left kidney.  There is extensive calcification in the abdominal aorta and iliac arteries. Uterus and ovaries are normal. Bladder is normal. No significant osseous abnormality.  IMPRESSION: Acute diverticulitis in the left lower quadrant with either a single markedly inflamed dilated diverticulum or a tiny diverticular abscess.   Electronically Signed   By: Rozetta Nunnery M.D.   On: 12/03/2013 20:20   Dg Chest Port 1 View  12/31/2013   CLINICAL DATA:  wheezing and swelling in arms  EXAM: PORTABLE CHEST - 1 VIEW  COMPARISON:  None.  FINDINGS: Sternotomy wires overlie normal cardiac silhouette. There is interval increased interstitial edema pattern compared to prior. There is some focality in the right lower lobe at the cardiophrenic angle.  IMPRESSION: 1. Interval increased interstitial pattern suggests interstitial edema. 2. Superimposed airspace disease in the right lower lobe representing pulmonary edema or infection.   Electronically Signed   By: Suzy Bouchard M.D.   On: 12/31/2013 23:43    Microbiology: Recent Results (from the past 240 hour(s))  CULTURE, BLOOD (ROUTINE X 2)     Status: None   Collection Time    12/28/13  1:35 AM      Result Value Ref Range Status   Specimen Description BLOOD ARM LEFT   Final   Special Requests     Final   Value: BOTTLES DRAWN AEROBIC AND  ANAEROBIC BLUE 10CC RED 5CC   Culture  Setup Time     Final   Value: 12/28/2013 13:28     Performed at [facility]   Culture     Final   Value:        BLOOD CULTURE RECEIVED NO GROWTH TO DATE CULTURE WILL BE HELD FOR 5 DAYS BEFORE ISSUING A FINAL NEGATIVE REPORT     Performed at [facility]   Report Status PENDING   Incomplete  CULTURE, BLOOD (ROUTINE X 2)     Status: None   Collection Time    12/28/13  2:05 AM      Result Value Ref Range Status   Specimen Description BLOOD HAND LEFT   Final   Special Requests BOTTLES DRAWN AEROBIC ONLY 10CC   Final   Culture  Setup Time     Final   Value: 12/28/2013 13:28     Performed at [facility]   Culture     Final   Value:        BLOOD CULTURE RECEIVED NO GROWTH TO DATE CULTURE WILL BE HELD FOR 5 DAYS BEFORE ISSUING A FINAL NEGATIVE REPORT     Performed at [facility]   Report Status PENDING   Incomplete  MRSA PCR SCREENING     Status: None   Collection Time    12/28/13  1:23 PM      Result Value Ref Range Status   MRSA by PCR NEGATIVE  NEGATIVE Final   Comment:            The GeneXpert MRSA Assay (FDA     approved for NASAL specimens     only), is one component of a     comprehensive MRSA colonization     surveillance program. It is not     intended to diagnose MRSA     infection nor to guide or     monitor treatment for     MRSA infections.  Labs: Basic Metabolic Panel:  Recent Labs Lab 12/27/13 2024 12/28/13 0201 12/29/13 0330 12/31/13 0600 01/01/14 0336  NA 134* 132* 137 136* 139  K 4.5 3.6* 3.7 3.1* 3.5*  CL 96 98 103 103 99  CO2 23 20 23 26 31   GLUCOSE 96 120* 88 133* 117*  BUN 19 14 6  3* 3*  CREATININE 0.70 0.52 0.52 0.43* 0.54  CALCIUM 8.9 7.6* 7.3* 7.6* 7.6*  MG  --  1.6 2.0  --   --   PHOS  --  2.7 1.8*  --   --    Liver Function Tests:  Recent Labs Lab 12/27/13 2024 12/28/13 0201  AST 25 19  ALT 26 20  ALKPHOS 45 33*  BILITOT 0.4 0.4  PROT 6.8 5.4*  ALBUMIN  3.8 3.0*    Recent Labs Lab 12/27/13 2024  LIPASE 32   No results found for this basename: AMMONIA,  in the last 168 hours CBC:  Recent Labs Lab 12/27/13 2024 12/28/13 0201 12/29/13 0330 12/30/13 0322 12/31/13 0600  WBC 12.2* 11.6* 6.7 6.1 6.1  NEUTROABS 9.6*  --   --   --   --   HGB 13.9 11.6* 11.4* 10.3* 9.8*  HCT 39.4 33.0* 33.0* 30.2* 28.4*  MCV 90.2 89.2 92.4 92.9 91.6  PLT 245 196 190 170 171   Cardiac Enzymes:  Recent Labs Lab 12/28/13 0820 12/28/13 1330 12/28/13 1925  TROPONINI <0.30 <0.30 <0.30   BNP: BNP (last 3 results) No results found for this basename: PROBNP,  in the last 8760 hours CBG: No results found for this basename: GLUCAP,  in the last 168 hours     Signed:  [name]  [facility] 01/02/2014, 12:27 PM

## 2014-01-02 NOTE — Evaluation (Signed)
Occupational Therapy Evaluation Patient Details Name: [name] MRN: [id number] DOB: 1946/01/19 Today's Date: 01/02/2014 Time: 5916-3846 OT Time Calculation (min): 30 min  OT Assessment / Plan / Recommendation History of present illness Patient is a 68 y.o. female admitted with diverticulitis with perforation now s/p exploratory laparotomy with [facility] procedure.   Clinical Impression   Pt demos decline in function with ADLs and ADL mobility safety with decreased strength, balance and endurance and would benefit from continued OT interventions at SNF level of care and deferred to SNF. All education completed and no further acute OT services indicated at this time and     OT Assessment  All further OT needs can be met in the next venue of care    Follow Up Recommendations  SNF;Supervision/Assistance - 24 hour    Barriers to Discharge  none, plans to d/c to [facility] in [place], [place] Recommendations  None recommended by OT;Other (comment) (TBD at next venue of care)    Recommendations for Other Services    Frequency       Precautions / Restrictions Precautions Precautions: Fall Precaution Comments: open abdominal wound/ colostomy Restrictions Weight Bearing Restrictions: No   Pertinent Vitals/Pain 3/10 abdominal pain    ADL  Grooming: Performed;Wash/dry hands;Wash/dry face;Min guard Where Assessed - Grooming: Unsupported standing Upper Body Bathing: Simulated;Set up;Supervision/safety Where Assessed - Upper Body Bathing: Unsupported sitting Lower Body Bathing: Performed;Moderate assistance Upper Body Dressing: Performed;Supervision/safety;Set up Where Assessed - Upper Body Dressing: Unsupported sitting Lower Body Dressing: Performed;Moderate assistance Toilet Transfer: Performed;Minimal assistance Toilet Transfer Method: Sit to stand Toilet Transfer Equipment: Regular height toilet;Grab bars Toileting - Clothing Manipulation and Hygiene:  Performed;Minimal assistance Tub/Shower Transfer Method: Not assessed Equipment Used: Rolling walker Transfers/Ambulation Related to ADLs: cues for hand placement    OT Diagnosis: Generalized weakness  OT Problem List: Decreased strength;Decreased knowledge of use of DME or AE;Decreased activity tolerance;Impaired balance (sitting and/or standing) OT Treatment Interventions:     OT Goals(Current goals can be found in the care plan section) Acute Rehab OT Goals Patient Stated Goal: To go to rehab prior to returning home OT Goal Formulation: With patient  Visit Information  Last OT Received On: 01/02/14 Assistance Needed: +1 History of Present Illness: Patient is a 69 y.o. female admitted with diverticulitis with perforation now s/p exploratory laparotomy with [facility] procedure.       Prior Functioning     Home Living Family/patient expects to be discharged to:: Skilled nursing facility Living Arrangements: Alone Available Help at Discharge: Family;Available PRN/intermittently Type of Home: House Home Access: Stairs to enter [employer] of Steps: 2 Entrance Stairs-Rails: None Home Layout: One level Additional Comments: reports has multiple items of medical equipment her mother used Prior Function Level of Independence: Independent Comments: still drives, home mgt Communication Communication: No difficulties Dominant Hand: Right         Vision/Perception Vision - History Baseline Vision: Wears glasses all the time Patient Visual Report: No change from baseline Perception Perception: Within Functional Limits   Cognition  Cognition Arousal/Alertness: Awake/alert Behavior During Therapy: WFL for tasks assessed/performed Overall Cognitive Status: Within Functional Limits for tasks assessed    Extremity/Trunk Assessment Upper Extremity Assessment Upper Extremity Assessment: Overall WFL for tasks assessed;Generalized weakness Lower Extremity  Assessment Lower Extremity Assessment: Defer to PT evaluation Cervical / Trunk Assessment Cervical / Trunk Assessment: Normal     Mobility Bed Mobility General bed mobility comments: pt up in recliner Transfers Overall transfer level: Needs  assistance Equipment used: Rolling walker (2 wheeled) Transfers: Sit to/from Stand Sit to Stand: Min assist General transfer comment: cues for hand placement          Balance Balance Overall balance assessment: Needs assistance Sitting-balance support: No upper extremity supported;Feet supported Sitting balance-Leahy Scale: Good Standing balance support: Single extremity supported;No upper extremity supported;During functional activity Standing balance-Leahy Scale: Fair   End of Session OT - End of Session Equipment Utilized During Treatment: Rolling walker Activity Tolerance: Patient tolerated treatment well Patient left: in chair;with call bell/phone within reach;with family/visitor present  GO     Britt Bottom 01/02/2014, 1:40 PM

## 2014-01-02 NOTE — Progress Notes (Signed)
5 Days Post-Op  Subjective: Pt doing well, minimal pain, mild distension with eating but not nausea/vomiting.  Tolerating fulls well and ensure.  Ambulating well, wants to go to SNF.  Pending SNF placement.  Using IS at about 1000.    Objective: Vital signs in last 24 hours: Temp:  [97.9 F (36.6 C)-98.6 F (37 C)] 98.4 F (36.9 C) (02/20 0515) Pulse Rate:  [68-77] 73 (02/20 0515) Resp:  [20-22] 20 (02/20 0515) BP: (115-141)/(55-60) 141/55 mmHg (02/20 0515) SpO2:  [95 %-98 %] 97 % (02/20 0515) Last BM Date: 12/31/13  Intake/Output from previous day: 02/19 0701 - 02/20 0700 In: 1657 [P.O.:960; I.V.:447; IV Piggyback:250] Out: 3000 [Urine:3000] Intake/Output this shift:    PE: Gen:  Alert, NAD, pleasant Card: RRR, no M/G/R heard, Normal S1 and S2  Pulm: CTA, no W/R/R, non tender Abd: Soft, NT/ND, +BS, Wound clean with less bleeding than yesterday.  No discharge or discoloration of borders. Colostomy inflated with gas and brown soft/liquid stools. Ext: Mild peripheral edema improved    Lab Results:   Recent Labs  12/31/13 0600  WBC 6.1  HGB 9.8*  HCT 28.4*  PLT 171   BMET  Recent Labs  12/31/13 0600 01/01/14 0336  NA 136* 139  K 3.1* 3.5*  CL 103 99  CO2 26 31  GLUCOSE 133* 117*  BUN 3* 3*  CREATININE 0.43* 0.54  CALCIUM 7.6* 7.6*   PT/INR  Recent Labs  01/01/14 0336 01/02/14 0348  LABPROT 23.0* 23.2*  INR 2.11* 2.14*   CMP     Component Value Date/Time   NA 139 01/01/2014 0336   K 3.5* 01/01/2014 0336   CL 99 01/01/2014 0336   CO2 31 01/01/2014 0336   GLUCOSE 117* 01/01/2014 0336   BUN 3* 01/01/2014 0336   CREATININE 0.54 01/01/2014 0336   CREATININE 0.69 09/05/2013 0938   CALCIUM 7.6* 01/01/2014 0336   PROT 5.4* 12/28/2013 0201   ALBUMIN 3.0* 12/28/2013 0201   AST 19 12/28/2013 0201   ALT 20 12/28/2013 0201   ALKPHOS 33* 12/28/2013 0201   BILITOT 0.4 12/28/2013 0201   GFRNONAA >90 01/01/2014 0336   GFRAA >90 01/01/2014 0336   Lipase      Component Value Date/Time   LIPASE 32 12/27/2013 2024       Studies/Results: Dg Chest Port 1 View  12/31/2013   CLINICAL DATA:  wheezing and swelling in arms  EXAM: PORTABLE CHEST - 1 VIEW  COMPARISON:  None.  FINDINGS: Sternotomy wires overlie normal cardiac silhouette. There is interval increased interstitial edema pattern compared to prior. There is some focality in the right lower lobe at the cardiophrenic angle.  IMPRESSION: 1. Interval increased interstitial pattern suggests interstitial edema. 2. Superimposed airspace disease in the right lower lobe representing pulmonary edema or infection.   Electronically Signed   By: Suzy Bouchard M.D.   On: 12/31/2013 23:43    Anti-infectives: Anti-infectives   Start     Dose/Rate Route Frequency Ordered Stop   12/29/13 0000  ertapenem (INVANZ) 1 g in sodium chloride 0.9 % 50 mL IVPB     1 g 100 mL/hr over 30 Minutes Intravenous Every 24 hours 12/28/13 0855     12/28/13 0200  ertapenem (INVANZ) 1 g in sodium chloride 0.9 % 50 mL IVPB  Status:  Discontinued     1 g 100 mL/hr over 30 Minutes Intravenous Every 24 hours 12/28/13 0150 12/28/13 1141   12/28/13 0145  piperacillin-tazobactam (ZOSYN) IVPB 3.375 g  Status:  Discontinued     3.375 g 12.5 mL/hr over 240 Minutes Intravenous 3 times per day 12/28/13 0142 12/28/13 0150   12/27/13 2330  ertapenem (INVANZ) 1 g in sodium chloride 0.9 % 50 mL IVPB     1 g 100 mL/hr over 30 Minutes Intravenous  Once 12/27/13 2327 12/28/13 0049   12/27/13 2315  ciprofloxacin (CIPRO) IVPB 400 mg  Status:  Discontinued     400 mg 200 mL/hr over 60 Minutes Intravenous  Once 12/27/13 2307 12/27/13 2327   12/27/13 2315  metroNIDAZOLE (FLAGYL) IVPB 500 mg  Status:  Discontinued     500 mg 100 mL/hr over 60 Minutes Intravenous  Once 12/27/13 2307 12/27/13 2327       Assessment/Plan POD #5 Exploratory laparotomy Hartman's procedure with splenic flexure mobilization. Sigmoid colon resection w/ colostomy (Dr.  [name])  Deconditioning  -On oral pain meds only -Advance diet to soft diet -Invanz 2/15----> would stop after 7 days -Ostomy care, WOC consult  -BID wet to dry dressing changes  -Mobilize & IS(1077ml)  -IV hydration KVO  -lovenox discontinued as INR > 2.0 on coumadin, Continue SCDs  -d/c foley and try voiding trails -Ready for d/c to SNF once urinating well and tolerating soft diet ?today or tomorrow     LOS: 6 days    DORT, Cherisse Carrell 01/02/2014, 9:08 AM Pager: [phone]

## 2014-01-02 NOTE — Social Work (Signed)
SNF bed offers provided to patient and her family- they have chosen [facility]- I have confirmed that they can accept patient tomorrow- MD advised- will ask weekend CSW to follow up to facilitate transfer-  Eduard Clos, MSW, [place]

## 2014-01-02 NOTE — Progress Notes (Signed)
Patient has voided successfully after her voiding trial. Denies nausea vomiting. Has air and stool in bag.  Alert, sitting in the chair. Abdomen-soft, mild tenderness. Stoma viable with air and stool in bag  OK to get a SNFf from our standpoint See PA note Recommend repeat bmet in the morning  [name]. [name], MD, FACS General, Bariatric, & Minimally Invasive Surgery [facility], [place]

## 2014-01-02 NOTE — Progress Notes (Signed)
[facility] PROGRESS NOTE  [name] ZOX:[id number] DOB: Jul 07, 1946 DOA: 12/27/2013 PCP: [name], MD  Assessment/Plan: 67 y.o. female with PMH of HTN, HPL, CAD s/p CABG, a fib on coumadin, recently Dx with acute diverticulitis was on atx 3 weeks presented with presyncopal episode and episode of hypotension and found to have Diverticultis with localized perforation.   1. Perforated diverticulitis s/p exp lap, Hartmann procedure with splenic flexure mobilization -per surgery, on IV atx last dose 2/21  2. A fib HR controlled; resumed coumarin d/c SQ lovenox  3. CAD s/p CBAG no acute cardiopulmonary symptoms;  -1/18: resume BB, NTG, holding ACE  -1/19: CXR congestion; given lasix 40x1; decrease IVF; clinically euvolemic; echo: LVEF 60%; monitor off diuretics   4. Hypo Ka; replace Po;   5. Urinary retention likely due to acute illness, pain, opioids; decrease benadryl; voiding trial   Code Status: full Family Communication: d/w patient, her daughter (indicate person spoken with, relationship, and if by phone, the number) Disposition Plan: snf 2/21   Consultants:  surgery  Procedures:  Lap exp  Antibiotics:  invanz  (indicate start date, and stop date if known)  HPI/Subjective: alert  Objective: Filed Vitals:   01/02/14 0515  BP: 141/55  Pulse: 73  Temp: 98.4 F (36.9 C)  Resp: 20    Intake/Output Summary (Last 24 hours) at 01/02/14 1124 Last data filed at 01/02/14 1015  Gross per 24 hour  Intake   1417 ml  Output   2850 ml  Net  -1433 ml   Filed Weights   12/28/13 0421 12/28/13 0938 12/30/13 1700  Weight: 67.132 kg (148 lb) 67.5 kg (148 lb 13 oz) 67.132 kg (148 lb)    Exam:   General:  alert  Cardiovascular: s1,s2 rrr  Respiratory: CTA BL  Abdomen: soft, nt colostomy   Musculoskeletal: no le edema   Data Reviewed: Basic Metabolic Panel:  Recent Labs Lab 12/27/13 2024 12/28/13 0201 12/29/13 0330 12/31/13 0600  01/01/14 0336  NA 134* 132* 137 136* 139  K 4.5 3.6* 3.7 3.1* 3.5*  CL 96 98 103 103 99  CO2 23 20 23 26 31   GLUCOSE 96 120* 88 133* 117*  BUN 19 14 6  3* 3*  CREATININE 0.70 0.52 0.52 0.43* 0.54  CALCIUM 8.9 7.6* 7.3* 7.6* 7.6*  MG  --  1.6 2.0  --   --   PHOS  --  2.7 1.8*  --   --    Liver Function Tests:  Recent Labs Lab 12/27/13 2024 12/28/13 0201  AST 25 19  ALT 26 20  ALKPHOS 45 33*  BILITOT 0.4 0.4  PROT 6.8 5.4*  ALBUMIN 3.8 3.0*    Recent Labs Lab 12/27/13 2024  LIPASE 32   No results found for this basename: AMMONIA,  in the last 168 hours CBC:  Recent Labs Lab 12/27/13 2024 12/28/13 0201 12/29/13 0330 12/30/13 0322 12/31/13 0600  WBC 12.2* 11.6* 6.7 6.1 6.1  NEUTROABS 9.6*  --   --   --   --   HGB 13.9 11.6* 11.4* 10.3* 9.8*  HCT 39.4 33.0* 33.0* 30.2* 28.4*  MCV 90.2 89.2 92.4 92.9 91.6  PLT 245 196 190 170 171   Cardiac Enzymes:  Recent Labs Lab 12/28/13 0820 12/28/13 1330 12/28/13 1925  TROPONINI <0.30 <0.30 <0.30   BNP (last 3 results) No results found for this basename: PROBNP,  in the last 8760 hours CBG: No results found for this basename: GLUCAP,  in the last  168 hours  Recent Results (from the past 240 hour(s))  CULTURE, BLOOD (ROUTINE X 2)     Status: None   Collection Time    12/28/13  1:35 AM      Result Value Ref Range Status   Specimen Description BLOOD ARM LEFT   Final   Special Requests     Final   Value: BOTTLES DRAWN AEROBIC AND ANAEROBIC BLUE 10CC RED 5CC   Culture  Setup Time     Final   Value: 12/28/2013 13:28     Performed at [facility]   Culture     Final   Value:        BLOOD CULTURE RECEIVED NO GROWTH TO DATE CULTURE WILL BE HELD FOR 5 DAYS BEFORE ISSUING A FINAL NEGATIVE REPORT     Performed at [facility]   Report Status PENDING   Incomplete  CULTURE, BLOOD (ROUTINE X 2)     Status: None   Collection Time    12/28/13  2:05 AM      Result Value Ref Range Status   Specimen  Description BLOOD HAND LEFT   Final   Special Requests BOTTLES DRAWN AEROBIC ONLY 10CC   Final   Culture  Setup Time     Final   Value: 12/28/2013 13:28     Performed at [facility]   Culture     Final   Value:        BLOOD CULTURE RECEIVED NO GROWTH TO DATE CULTURE WILL BE HELD FOR 5 DAYS BEFORE ISSUING A FINAL NEGATIVE REPORT     Performed at [facility]   Report Status PENDING   Incomplete  MRSA PCR SCREENING     Status: None   Collection Time    12/28/13  1:23 PM      Result Value Ref Range Status   MRSA by PCR NEGATIVE  NEGATIVE Final   Comment:            The GeneXpert MRSA Assay (FDA     approved for NASAL specimens     only), is one component of a     comprehensive MRSA colonization     surveillance program. It is not     intended to diagnose MRSA     infection nor to guide or     monitor treatment for     MRSA infections.     Studies: Dg Chest Port 1 View  12/31/2013   CLINICAL DATA:  wheezing and swelling in arms  EXAM: PORTABLE CHEST - 1 VIEW  COMPARISON:  None.  FINDINGS: Sternotomy wires overlie normal cardiac silhouette. There is interval increased interstitial edema pattern compared to prior. There is some focality in the right lower lobe at the cardiophrenic angle.  IMPRESSION: 1. Interval increased interstitial pattern suggests interstitial edema. 2. Superimposed airspace disease in the right lower lobe representing pulmonary edema or infection.   Electronically Signed   By: Suzy Bouchard M.D.   On: 12/31/2013 23:43    Scheduled Meds: . ertapenem  1 g Intravenous Q24H  . feeding supplement (ENSURE COMPLETE)  237 mL Oral BID BM  . isosorbide mononitrate  15 mg Oral Daily  . metoprolol tartrate  25 mg Oral BID  . ondansetron  4 mg Intravenous Once  . sodium chloride  3 mL Intravenous Q12H  . warfarin  1.25 mg Oral ONCE-1800  . Warfarin - Pharmacist Dosing Inpatient   Does not apply q1800   Continuous  Infusions: . dextrose 5 % and 0.9 %  NaCl with KCl 20 mEq/L 20 mL (01/02/14 0019)    Active Problems:   Paroxysmal atrial fibrillation, maintaining SR on coumadin    Long term (current) use of anticoagulants   CAD (coronary artery disease) with CABG   HTN (hypertension)   Diverticulitis with perforation   Sepsis   Perforated diverticulitis    Time spent: >35 minutes     [name]  [facility] Pager [phone]. If 7PM-7AM, please contact night-coverage at [email or web address], password [facility] 01/02/2014, 11:24 AM  LOS: 6 days

## 2014-01-03 LAB — URINALYSIS, ROUTINE W REFLEX MICROSCOPIC
Bilirubin Urine: NEGATIVE
Glucose, UA: NEGATIVE mg/dL
Hgb urine dipstick: NEGATIVE
Ketones, ur: NEGATIVE mg/dL
Nitrite: NEGATIVE
Protein, ur: NEGATIVE mg/dL
Specific Gravity, Urine: 1.01 (ref 1.005–1.030)
Urobilinogen, UA: 1 mg/dL (ref 0.0–1.0)
pH: 7 (ref 5.0–8.0)

## 2014-01-03 LAB — CULTURE, BLOOD (ROUTINE X 2)
Culture: NO GROWTH
Culture: NO GROWTH

## 2014-01-03 LAB — URINE MICROSCOPIC-ADD ON

## 2014-01-03 LAB — PROTIME-INR
INR: 1.69 — ABNORMAL HIGH (ref 0.00–1.49)
Prothrombin Time: 19.4 seconds — ABNORMAL HIGH (ref 11.6–15.2)

## 2014-01-03 MED ORDER — FLUCONAZOLE 150 MG PO TABS: 150.0000 mg | ORAL_TABLET | Freq: Every day | ORAL | Status: DC

## 2014-01-03 MED ORDER — VITAMINS A & D EX OINT
TOPICAL_OINTMENT | Freq: Two times a day (BID) | CUTANEOUS | Status: DC
  Filled 2014-01-03 (×3): qty 5

## 2014-01-03 MED ORDER — FLUCONAZOLE 150 MG PO TABS
150.0000 mg | ORAL_TABLET | Freq: Every day | ORAL | Status: DC
  Administered 2014-01-03: 150 mg via ORAL
  Filled 2014-01-03: qty 1

## 2014-01-03 MED ORDER — WARFARIN SODIUM 2.5 MG PO TABS
2.5000 mg | ORAL_TABLET | Freq: Once | ORAL | Status: DC
  Filled 2014-01-03: qty 1

## 2014-01-03 MED ORDER — AMMONIUM LACTATE 12 % EX LOTN: 1.0000 "application " | TOPICAL_LOTION | CUTANEOUS | Status: DC | PRN

## 2014-01-03 NOTE — Progress Notes (Signed)
Report called to Shirlean Mylar, Therapist, sports at [facility]. Discharged by car accompanied by daughter.Charlies Silvers, RN

## 2014-01-03 NOTE — Plan of Care (Signed)
Called by RN to inform that pt was discharged earlier today to [facility] and facility was calling about not having pain medication orders other than tylenol and no orders for wound care.  Pain medication was already resolved, however, they will not change dressing until orders given.  [employer] at [phone] and spoke with RN at facility and gave orders for BID wet to dry dressing changes as per our records and what was being done in hospital.  Adi Doro 10:15 PM 01/03/14

## 2014-01-03 NOTE — Progress Notes (Signed)
Patient is medically stable for D/C to [facility]. Clinical Education officer, museum (CSW) prepared D/C packet and RN will call report. Per Production assistant, radio at [facility] patient is going to room 504. Patient's daughter Ethlyn Daniels is transporting patient in her personal vehicle. CSW contacted daughter and made her aware of above. Please reconsult if further social work needs arise. CSW signing off.   Blima Rich, [place] Week[employer] CSW [phone]

## 2014-01-03 NOTE — Progress Notes (Addendum)
6 Days Post-Op  Subjective: Stable and alert. Afebrile. Tolerating soft diet. Colostomy working.  Her main complaint is skin irritation and erythema of the upper inner thighs. This is not perforated it is painful when she urinates.  Continues on IV Invanz, Imdur, Lopressor tablet, Coumadin  Objective: Vital signs in last 24 hours: Temp:  [98.1 F (36.7 C)-98.4 F (36.9 C)] 98.2 F (36.8 C) (02/21 0426) Pulse Rate:  [70-73] 70 (02/21 0426) Resp:  [20] 20 (02/21 0426) BP: (121-147)/(55-66) 147/66 mmHg (02/21 0426) SpO2:  [94 %-97 %] 97 % (02/21 0426) Last BM Date: 12/31/13  Intake/Output from previous day: 02/20 0701 - 02/21 0700 In: 410 [P.O.:360; IV Piggyback:50] Out: 1600 [Urine:1550; Stool:50] Intake/Output this shift: Total I/O In: -  Out: 400 [Urine:400]  General appearance: alert. In no distress. Pleasant. Cooperative. GI: abdomen soft. Nondistended. Midline wound packed open, fascia intact. Colostomy pink with stool in bag. Skin: Skin color, texture, turgor normal. No rashes or lesions or llooks like significant erythema and superficial desquamation upper inner thighs. This looks like a fungal dermatitis.  Lab Results:  Results for orders placed during the hospital encounter of 12/27/13 (from the past 24 hour(s))  PROTIME-INR     Status: Abnormal   Collection Time    01/03/14  5:20 AM      Result Value Ref Range   Prothrombin Time 19.4 (*) 11.6 - 15.2 seconds   INR 1.69 (*) 0.00 - 1.49     Studies/Results: @RISRSLT24 @  . ertapenem  1 g Intravenous Q24H  . feeding supplement (ENSURE COMPLETE)  237 mL Oral BID BM  . fluconazole  150 mg Oral Daily  . isosorbide mononitrate  15 mg Oral Daily  . metoprolol tartrate  25 mg Oral BID  . ondansetron  4 mg Intravenous Once  . sodium chloride  3 mL Intravenous Q12H  . vitamin A & D   Topical BID  . Warfarin - Pharmacist Dosing Inpatient   Does not apply q1800     Assessment/Plan: s/p Procedure(s): EXPLORATORY  LAPAROTOMY COLON RESECTION SIGMOID COLOSTOMY  POD #6 Exploratory laparotomy Hartman's procedure with splenic flexure mobilization For perforated sigmoid diverticulitis. (Dr. [name])  Plan to transfer to [facility] today. Followup with Dr. [name] in 2 weeks Twice a day dressing changes to abdominal wound Routine colostomy care.  Fungal dermatitis, upper inner thighs. We'll start Diflucan 150 mg daily x7 days. We'll start A. And D. Ointment twice a day. This may interact with her Coumadin, and so her INR will need to be monitored closely for the next 7 days and thereafter. I discussed this with the patient, the bedside RN, and with Dr. Daleen Bo. . This will be reported to the receiving facility so that they can closely monitor this potential drug interaction.  Paroxysmal initial fibrillation, on Coumadin  Coronary artery disease, status post CABG  Hypertension  Sepsis, resolved.  @PROBHOSP @  LOS: 7 days    Mirka Barbone M 01/03/2014  . .prob

## 2014-01-05 ENCOUNTER — Telehealth (INDEPENDENT_AMBULATORY_CARE_PROVIDER_SITE_OTHER): Payer: Self-pay | Admitting: *Deleted

## 2014-01-05 ENCOUNTER — Telehealth: Payer: Self-pay | Admitting: *Deleted

## 2014-01-05 ENCOUNTER — Ambulatory Visit (INDEPENDENT_AMBULATORY_CARE_PROVIDER_SITE_OTHER): Payer: Medicare Other | Admitting: Pharmacist Clinician (PhC)/ Clinical Pharmacy Specialist

## 2014-01-05 DIAGNOSIS — Z7901 Long term (current) use of anticoagulants: Secondary | ICD-10-CM

## 2014-01-05 DIAGNOSIS — I4891 Unspecified atrial fibrillation: Secondary | ICD-10-CM

## 2014-01-05 DIAGNOSIS — I480 Paroxysmal atrial fibrillation: Secondary | ICD-10-CM

## 2014-01-05 LAB — POCT INR: INR: 1.8

## 2014-01-05 NOTE — Telephone Encounter (Signed)
For appt how about 8:45am on Thursday 3/5.  Stool amunt is going to be varied. It is important that there is air in bag daily. If pt taking pain meds - may need to be on stool softner like colace and or miralax as needed

## 2014-01-05 NOTE — Telephone Encounter (Signed)
Patient's daughter called to schedule a PO appt for patient however I don't see any time available.  Patient's surgery was 2/15.  Daughter also wanted to let Dr. [name] know that though her mother is eating good she isn't producing what she feels like is enough stool.  She states it is liquid which she knows is normal but felt she should be putting out more.  Explained that I will send the message to Dr. [name] to get his advice and we will also look at the schedule for where we can see her for a PO appt.  Hinton Dyer states understanding and agreeable at this time.

## 2014-01-05 NOTE — Telephone Encounter (Signed)
See anticoag note, spoke with both patient and facility

## 2014-01-05 NOTE — Telephone Encounter (Signed)
Pt called stating that she had emergency surgery and her INR is up and needs to speak to you.

## 2014-01-05 NOTE — Telephone Encounter (Signed)
Called and left information from below message on [name]'s voicemail.  Gave appt date and time.  I also advised about using Colace or Miralax as needed if taking pain medication.  Instructed her to give [place] a call back with any questions.

## 2014-01-06 ENCOUNTER — Telehealth (INDEPENDENT_AMBULATORY_CARE_PROVIDER_SITE_OTHER): Payer: Self-pay | Admitting: General Surgery

## 2014-01-06 NOTE — Telephone Encounter (Signed)
Spoke today with pt's daughter, Hinton Dyer.  She reports her mother has opted to go to [facility], as skilled nursing facility in [place], [place].  They have no orders for her, so daughter requested Dr. [name] provide them to cover diet (heart pt, so low fat and low sodium,) dressing changes, and pain medication.  [employer] and discussed with her nurse there.  Gave verbal order for "Heart Health diet" to restrict sodium and fats.  They do need "very specific" orders for dressing changes/ colostomy care.  Can FAX them to [phone].

## 2014-01-06 NOTE — Telephone Encounter (Signed)
Print out discharge summary and fax it to that place. i don't know why this is an issue. All of this should have been handled by the [employer] Education officer, museum. Wet-dry gauze to open midline incision cover with dry and gauze and tape. Routine colostomy care. Pt needs her INR monitored since on antifungal. She can have percocet 5/325 1 to 2 tabs by mouth every 4 hrs as needed for pain

## 2014-01-07 ENCOUNTER — Ambulatory Visit (INDEPENDENT_AMBULATORY_CARE_PROVIDER_SITE_OTHER): Payer: Medicare Other | Admitting: Pharmacist Clinician (PhC)/ Clinical Pharmacy Specialist

## 2014-01-07 DIAGNOSIS — I480 Paroxysmal atrial fibrillation: Secondary | ICD-10-CM

## 2014-01-07 DIAGNOSIS — I4891 Unspecified atrial fibrillation: Secondary | ICD-10-CM

## 2014-01-07 DIAGNOSIS — Z7901 Long term (current) use of anticoagulants: Secondary | ICD-10-CM

## 2014-01-14 ENCOUNTER — Ambulatory Visit (INDEPENDENT_AMBULATORY_CARE_PROVIDER_SITE_OTHER): Payer: Medicare Other | Admitting: Pharmacist Clinician (PhC)/ Clinical Pharmacy Specialist

## 2014-01-14 DIAGNOSIS — I480 Paroxysmal atrial fibrillation: Secondary | ICD-10-CM

## 2014-01-14 DIAGNOSIS — Z7901 Long term (current) use of anticoagulants: Secondary | ICD-10-CM

## 2014-01-14 DIAGNOSIS — I4891 Unspecified atrial fibrillation: Secondary | ICD-10-CM

## 2014-01-14 LAB — POCT INR: INR: 2.9

## 2014-01-15 ENCOUNTER — Encounter (INDEPENDENT_AMBULATORY_CARE_PROVIDER_SITE_OTHER): Payer: Self-pay | Admitting: General Surgery

## 2014-01-15 ENCOUNTER — Ambulatory Visit (INDEPENDENT_AMBULATORY_CARE_PROVIDER_SITE_OTHER): Payer: Medicare Other | Admitting: General Surgery

## 2014-01-15 VITALS — BP 125/68 | HR 76 | Temp 98.1°F | Resp 18 | Ht 63.0 in | Wt 138.8 lb

## 2014-01-15 DIAGNOSIS — Z933 Colostomy status: Secondary | ICD-10-CM | POA: Insufficient documentation

## 2014-01-15 NOTE — Progress Notes (Signed)
Subjective:     Patient ID: [name], female   DOB: 09-Aug-1946, 68 y.o.   MRN: [id number]  HPI 68 year old female comes in today for her first postoperative appointment after going emergency exploratory laparotomy with sigmoid colectomy with end colostomy for perforated sigmoid diverticulitis on2/15. She was in the hospital from 2/14 through the 20th. She is leaving skilled nursing care today In going home with her daughter. She denies any fever, chills, vomiting or significant abdominal pain. She states her ostomy is functioning and they're slowly getting use to ostomy care. She is still getting wet-to-dry dressings once a day for a midline incision. She states her blood pressure was a little bit low yesterday so she held her blood pressure medications. She has resumed her Coumadin and has had her INR checked most recently yesterday which was therapeutic. Her daughter is concerned a little bit about her hemoglobin and platelet count. On February 27 her hemoglobin was 10.7 hematocrit 32.6 and platelet count 703. Yesterday her hemoglobin was 12, hematocrit 35.9, platelet count 726. She is taking a daily aspirin. Her appetite is slowly improving.  Review of Systems     Objective:   Physical Exam BP 125/68  Pulse 76  Temp(Src) 98.1 F (36.7 C) (Temporal)  Resp 18  Ht 5\' 3"  (1.6 m)  Wt 138 lb 12.8 oz (62.959 kg)  BMI 24.59 kg/m2  Gen: alert, NAD, non-toxic appearing Pupils: equal, no scleral icterus Pulm: Lungs clear to auscultation, symmetric chest rise CV: regular rate and rhythm Abd: soft, nontender, nondistended. Open midline with granulation tissue not deep. No cellulitis. No incisional hernia Ext: no edema, no calf tenderness Skin: no rash, no jaundice     Assessment:     Status post Hartman's procedure for perforated diverticulitis     Plan:     I reviewed the pathology with the patient and her daughter and they were provided a copy which showed diverticular disease with  transmural defects. No evidence of malignancy. We reviewed her labs and I confirm that she probably does have iron deficiency anemia and recommended an iron supplement twice a day. I also encouraged her to continue taking a daily aspirin. She was instructed to follow with her primary care doctor within one to 2 weeks to discuss these issues. She is to continue wet to dry dressings for the time being. She will continue home health nursing. Followup with me in 6 weeks. We discussed the importance of appetite and eating healthy meals and supplementing her foods with protein shakes.  [name]. [name], MD, FACS General, Bariatric, & Minimally Invasive Surgery [facility], [place]

## 2014-01-15 NOTE — Patient Instructions (Signed)
Continue to take daily aspirin Start taking iron supplement - ferrous sulfate 325 mg by mouth twice a day to help increase hemoglobin - it can cause constipation Not unusual to pass mucous per rectum Drink boost/ensure if don't feel like eating meal Continue wet-dry packing once a day for now Can shower Follow up with your PCP in 1-2 weeks

## 2014-01-21 ENCOUNTER — Ambulatory Visit (INDEPENDENT_AMBULATORY_CARE_PROVIDER_SITE_OTHER): Payer: Medicare Other | Admitting: Pharmacist Clinician (PhC)/ Clinical Pharmacy Specialist

## 2014-01-21 VITALS — BP 110/70 | HR 68

## 2014-01-21 DIAGNOSIS — I480 Paroxysmal atrial fibrillation: Secondary | ICD-10-CM

## 2014-01-21 DIAGNOSIS — I4891 Unspecified atrial fibrillation: Secondary | ICD-10-CM

## 2014-01-21 DIAGNOSIS — Z7901 Long term (current) use of anticoagulants: Secondary | ICD-10-CM

## 2014-01-21 LAB — POCT INR: INR: 3.3

## 2014-01-22 ENCOUNTER — Other Ambulatory Visit: Payer: Self-pay | Admitting: Cardiovascular Disease

## 2014-01-23 ENCOUNTER — Telehealth: Payer: Self-pay | Admitting: Cardiovascular Disease

## 2014-01-23 NOTE — Telephone Encounter (Signed)
Rx was sent to pharmacy electronically. 

## 2014-01-23 NOTE — Telephone Encounter (Signed)
She is out of Simvastatin please make sure you will do this for her today. She called the pharmacy already.

## 2014-01-23 NOTE — Telephone Encounter (Signed)
Returned call.  Left message that refill sent at 7:57am today and advised to check back w/ pharmacy for pick up.  Also to call back before 4pm if questions.

## 2014-02-04 ENCOUNTER — Ambulatory Visit: Payer: Medicare Other | Admitting: Pharmacist Clinician (PhC)/ Clinical Pharmacy Specialist

## 2014-02-05 ENCOUNTER — Telehealth (INDEPENDENT_AMBULATORY_CARE_PROVIDER_SITE_OTHER): Payer: Self-pay | Admitting: General Surgery

## 2014-02-05 ENCOUNTER — Ambulatory Visit (INDEPENDENT_AMBULATORY_CARE_PROVIDER_SITE_OTHER): Payer: Medicare Other | Admitting: Pharmacist Clinician (PhC)/ Clinical Pharmacy Specialist

## 2014-02-05 DIAGNOSIS — Z7901 Long term (current) use of anticoagulants: Secondary | ICD-10-CM

## 2014-02-05 DIAGNOSIS — I480 Paroxysmal atrial fibrillation: Secondary | ICD-10-CM

## 2014-02-05 DIAGNOSIS — I4891 Unspecified atrial fibrillation: Secondary | ICD-10-CM

## 2014-02-05 LAB — POCT INR: INR: 1.9

## 2014-02-05 NOTE — Telephone Encounter (Signed)
Pt of Dr. [name] had colon resection on 12/28/13.  Her daughter has been doing daily dressing changes with home health "looking in" on her from time to time.  Daughter is going on vacation, so will not be available to do her dressings.  Wound is nearly fully healed now, so pt asks if okay for home health to do dressings every other day.  Please advise.

## 2014-02-05 NOTE — Telephone Encounter (Signed)
That is fine 

## 2014-02-07 ENCOUNTER — Inpatient Hospital Stay (HOSPITAL_COMMUNITY)
Admission: EM | Admit: 2014-02-07 | Discharge: 2014-02-14 | DRG: 552 | Disposition: A | Discharge status: Home / Self Care | Payer: Medicare Other | Attending: Internal Medicine | Admitting: Internal Medicine

## 2014-02-07 ENCOUNTER — Emergency Department (HOSPITAL_COMMUNITY): Payer: Medicare Other

## 2014-02-07 DIAGNOSIS — I5022 Chronic systolic (congestive) heart failure: Secondary | ICD-10-CM | POA: Diagnosis present

## 2014-02-07 DIAGNOSIS — K5909 Other constipation: Secondary | ICD-10-CM | POA: Diagnosis not present

## 2014-02-07 DIAGNOSIS — I252 Old myocardial infarction: Secondary | ICD-10-CM

## 2014-02-07 DIAGNOSIS — Z79899 Other long term (current) drug therapy: Secondary | ICD-10-CM

## 2014-02-07 DIAGNOSIS — Z87891 Personal history of nicotine dependence: Secondary | ICD-10-CM

## 2014-02-07 DIAGNOSIS — I2789 Other specified pulmonary heart diseases: Secondary | ICD-10-CM | POA: Diagnosis present

## 2014-02-07 DIAGNOSIS — Z951 Presence of aortocoronary bypass graft: Secondary | ICD-10-CM

## 2014-02-07 DIAGNOSIS — Z8249 Family history of ischemic heart disease and other diseases of the circulatory system: Secondary | ICD-10-CM

## 2014-02-07 DIAGNOSIS — R259 Unspecified abnormal involuntary movements: Secondary | ICD-10-CM | POA: Diagnosis present

## 2014-02-07 DIAGNOSIS — I4891 Unspecified atrial fibrillation: Secondary | ICD-10-CM | POA: Diagnosis present

## 2014-02-07 DIAGNOSIS — Z933 Colostomy status: Secondary | ICD-10-CM

## 2014-02-07 DIAGNOSIS — M545 Low back pain, unspecified: Principal | ICD-10-CM | POA: Diagnosis present

## 2014-02-07 DIAGNOSIS — E871 Hypo-osmolality and hyponatremia: Secondary | ICD-10-CM | POA: Diagnosis present

## 2014-02-07 DIAGNOSIS — M3500 Sicca syndrome, unspecified: Secondary | ICD-10-CM | POA: Diagnosis present

## 2014-02-07 DIAGNOSIS — G894 Chronic pain syndrome: Secondary | ICD-10-CM | POA: Diagnosis present

## 2014-02-07 DIAGNOSIS — Z9049 Acquired absence of other specified parts of digestive tract: Secondary | ICD-10-CM

## 2014-02-07 DIAGNOSIS — E785 Hyperlipidemia, unspecified: Secondary | ICD-10-CM | POA: Diagnosis present

## 2014-02-07 DIAGNOSIS — Z7401 Bed confinement status: Secondary | ICD-10-CM

## 2014-02-07 DIAGNOSIS — K5900 Constipation, unspecified: Secondary | ICD-10-CM | POA: Diagnosis present

## 2014-02-07 DIAGNOSIS — IMO0002 Reserved for concepts with insufficient information to code with codable children: Secondary | ICD-10-CM

## 2014-02-07 DIAGNOSIS — M47817 Spondylosis without myelopathy or radiculopathy, lumbosacral region: Secondary | ICD-10-CM | POA: Diagnosis present

## 2014-02-07 DIAGNOSIS — I10 Essential (primary) hypertension: Secondary | ICD-10-CM | POA: Diagnosis present

## 2014-02-07 DIAGNOSIS — M8448XA Pathological fracture, other site, initial encounter for fracture: Secondary | ICD-10-CM | POA: Diagnosis present

## 2014-02-07 DIAGNOSIS — I509 Heart failure, unspecified: Secondary | ICD-10-CM | POA: Diagnosis present

## 2014-02-07 DIAGNOSIS — I272 Pulmonary hypertension, unspecified: Secondary | ICD-10-CM

## 2014-02-07 DIAGNOSIS — D649 Anemia, unspecified: Secondary | ICD-10-CM | POA: Diagnosis present

## 2014-02-07 DIAGNOSIS — Z7901 Long term (current) use of anticoagulants: Secondary | ICD-10-CM

## 2014-02-07 DIAGNOSIS — Z85828 Personal history of other malignant neoplasm of skin: Secondary | ICD-10-CM

## 2014-02-07 DIAGNOSIS — I480 Paroxysmal atrial fibrillation: Secondary | ICD-10-CM | POA: Diagnosis present

## 2014-02-07 DIAGNOSIS — I25118 Atherosclerotic heart disease of native coronary artery with other forms of angina pectoris: Secondary | ICD-10-CM | POA: Diagnosis present

## 2014-02-07 DIAGNOSIS — I739 Peripheral vascular disease, unspecified: Secondary | ICD-10-CM | POA: Diagnosis present

## 2014-02-07 DIAGNOSIS — Z807 Family history of other malignant neoplasms of lymphoid, hematopoietic and related tissues: Secondary | ICD-10-CM

## 2014-02-07 DIAGNOSIS — I2510 Atherosclerotic heart disease of native coronary artery without angina pectoris: Secondary | ICD-10-CM

## 2014-02-07 DIAGNOSIS — M549 Dorsalgia, unspecified: Secondary | ICD-10-CM | POA: Diagnosis present

## 2014-02-07 LAB — COMPREHENSIVE METABOLIC PANEL
ALT: 13 U/L (ref 0–35)
AST: 15 U/L (ref 0–37)
Albumin: 3.6 g/dL (ref 3.5–5.2)
Alkaline Phosphatase: 70 U/L (ref 39–117)
BUN: 16 mg/dL (ref 6–23)
CO2: 25 mEq/L (ref 19–32)
Calcium: 9.5 mg/dL (ref 8.4–10.5)
Chloride: 90 mEq/L — ABNORMAL LOW (ref 96–112)
Creatinine, Ser: 0.75 mg/dL (ref 0.50–1.10)
GFR calc Af Amer: >90 mL/min (ref >90)
GFR calc non Af Amer: 84 mL/min — ABNORMAL LOW (ref >90)
Glucose, Bld: 138 mg/dL — ABNORMAL HIGH (ref 70–99)
Potassium: 4.5 mEq/L (ref 3.7–5.3)
Sodium: 130 mEq/L — ABNORMAL LOW (ref 137–147)
Total Bilirubin: <0.2 mg/dL — ABNORMAL LOW (ref 0.3–1.2)
Total Protein: 7.5 g/dL (ref 6.0–8.3)

## 2014-02-07 LAB — CBC WITH DIFFERENTIAL/PLATELET
Basophils Absolute: 0 10*3/uL (ref 0.0–0.1)
Basophils Relative: 0 % (ref 0–1)
Eosinophils Absolute: 0.1 10*3/uL (ref 0.0–0.7)
Eosinophils Relative: 1 % (ref 0–5)
HCT: 34.1 % — ABNORMAL LOW (ref 36.0–46.0)
Hemoglobin: 11.9 g/dL — ABNORMAL LOW (ref 12.0–15.0)
Lymphocytes Relative: 30 % (ref 12–46)
Lymphs Abs: 3.1 10*3/uL (ref 0.7–4.0)
MCH: 30.5 pg (ref 26.0–34.0)
MCHC: 34.9 g/dL (ref 30.0–36.0)
MCV: 87.4 fL (ref 78.0–100.0)
Monocytes Absolute: 0.7 10*3/uL (ref 0.1–1.0)
Monocytes Relative: 7 % (ref 3–12)
Neutro Abs: 6.2 10*3/uL (ref 1.7–7.7)
Neutrophils Relative %: 62 % (ref 43–77)
Platelets: 323 10*3/uL (ref 150–400)
RBC: 3.9 MIL/uL (ref 3.87–5.11)
RDW: 13 % (ref 11.5–15.5)
WBC: 10.1 10*3/uL (ref 4.0–10.5)

## 2014-02-07 LAB — LIPASE, BLOOD: Lipase: 28 U/L (ref 11–59)

## 2014-02-07 MED ORDER — KETOROLAC TROMETHAMINE 30 MG/ML IJ SOLN
15.0000 mg | Freq: Once | INTRAMUSCULAR | Status: DC
  Filled 2014-02-07: qty 1

## 2014-02-07 MED ORDER — ONDANSETRON HCL 4 MG/2ML IJ SOLN
4.0000 mg | Freq: Once | INTRAMUSCULAR | Status: AC
  Administered 2014-02-07: 4 mg via INTRAVENOUS
  Filled 2014-02-07: qty 2

## 2014-02-07 MED ORDER — KETOROLAC TROMETHAMINE 15 MG/ML IJ SOLN
15.0000 mg | Freq: Once | INTRAMUSCULAR | Status: AC
  Administered 2014-02-07: 15 mg via INTRAVENOUS

## 2014-02-07 MED ORDER — FENTANYL CITRATE 0.05 MG/ML IJ SOLN
50.0000 ug | Freq: Once | INTRAMUSCULAR | Status: AC
  Administered 2014-02-07: 50 ug via INTRAVENOUS
  Filled 2014-02-07: qty 2

## 2014-02-07 MED ORDER — SODIUM CHLORIDE 0.9 % IV BOLUS (SEPSIS)
1000.0000 mL | Freq: Once | INTRAVENOUS | Status: AC
  Administered 2014-02-08: 1000 mL via INTRAVENOUS

## 2014-02-07 MED ORDER — FENTANYL CITRATE 0.05 MG/ML IJ SOLN
50.0000 ug | Freq: Once | INTRAMUSCULAR | Status: AC
  Administered 2014-02-08: 50 ug via INTRAVENOUS
  Filled 2014-02-07: qty 2

## 2014-02-07 NOTE — ED Notes (Signed)
Patient transported to X-ray 

## 2014-02-07 NOTE — ED Notes (Addendum)
12/28/13: emergency colon resection; the wound has been left open. Went to rehab for wound care. Low back pain x 3 days. Family took to orthopedic dr. And prescribed pain meds but not helpful now. Scheduled for MRI Monday morning of lower back. X 2 days of no bm.
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
## 2014-02-08 ENCOUNTER — Emergency Department (HOSPITAL_COMMUNITY): Payer: Medicare Other

## 2014-02-08 ENCOUNTER — Encounter (HOSPITAL_COMMUNITY): Payer: Self-pay | Admitting: Internal Medicine

## 2014-02-08 DIAGNOSIS — I2510 Atherosclerotic heart disease of native coronary artery without angina pectoris: Secondary | ICD-10-CM

## 2014-02-08 DIAGNOSIS — Z7901 Long term (current) use of anticoagulants: Secondary | ICD-10-CM

## 2014-02-08 DIAGNOSIS — I10 Essential (primary) hypertension: Secondary | ICD-10-CM

## 2014-02-08 DIAGNOSIS — M549 Dorsalgia, unspecified: Secondary | ICD-10-CM

## 2014-02-08 DIAGNOSIS — I4891 Unspecified atrial fibrillation: Secondary | ICD-10-CM

## 2014-02-08 DIAGNOSIS — E871 Hypo-osmolality and hyponatremia: Secondary | ICD-10-CM | POA: Diagnosis present

## 2014-02-08 LAB — PROTIME-INR
INR: 2.2 — ABNORMAL HIGH (ref 0.00–1.49)
Prothrombin Time: 23.7 seconds — ABNORMAL HIGH (ref 11.6–15.2)

## 2014-02-08 LAB — OSMOLALITY, URINE: Osmolality, Ur: 264 mOsm/kg — ABNORMAL LOW (ref 390–1090)

## 2014-02-08 LAB — CREATININE, URINE, RANDOM: Creatinine, Urine: 78.83 mg/dL

## 2014-02-08 LAB — SODIUM, URINE, RANDOM: Sodium, Ur: <20 mEq/L

## 2014-02-08 MED ORDER — OXYCODONE-ACETAMINOPHEN 5-325 MG PO TABS: 1.0000 | ORAL_TABLET | Freq: Four times a day (QID) | ORAL | Status: DC | PRN

## 2014-02-08 MED ORDER — ACETAMINOPHEN 325 MG PO TABS
650.0000 mg | ORAL_TABLET | Freq: Four times a day (QID) | ORAL | Status: DC | PRN
  Administered 2014-02-13 – 2014-02-14 (×4): 650 mg via ORAL
  Filled 2014-02-08 (×5): qty 2

## 2014-02-08 MED ORDER — ISOSORBIDE MONONITRATE ER 30 MG PO TB24
30.0000 mg | ORAL_TABLET | Freq: Every day | ORAL | Status: DC
  Administered 2014-02-08 – 2014-02-14 (×5): 30 mg via ORAL
  Filled 2014-02-08 (×7): qty 1

## 2014-02-08 MED ORDER — ASPIRIN EC 81 MG PO TBEC
81.0000 mg | DELAYED_RELEASE_TABLET | Freq: Every day | ORAL | Status: DC
  Administered 2014-02-08 – 2014-02-13 (×6): 81 mg via ORAL
  Filled 2014-02-08 (×7): qty 1

## 2014-02-08 MED ORDER — POLYETHYLENE GLYCOL 3350 17 G PO PACK
17.0000 g | PACK | Freq: Two times a day (BID) | ORAL | Status: DC
  Administered 2014-02-08 – 2014-02-14 (×13): 17 g via ORAL
  Filled 2014-02-08 (×17): qty 1

## 2014-02-08 MED ORDER — METOPROLOL SUCCINATE ER 25 MG PO TB24
25.0000 mg | ORAL_TABLET | Freq: Every day | ORAL | Status: DC
  Administered 2014-02-09 – 2014-02-14 (×5): 25 mg via ORAL
  Filled 2014-02-08 (×7): qty 1

## 2014-02-08 MED ORDER — VITAMIN B-6 100 MG PO TABS
100.0000 mg | ORAL_TABLET | Freq: Every day | ORAL | Status: DC
  Administered 2014-02-08 – 2014-02-14 (×7): 100 mg via ORAL
  Filled 2014-02-08 (×7): qty 1

## 2014-02-08 MED ORDER — METOPROLOL SUCCINATE ER 50 MG PO TB24
50.0000 mg | ORAL_TABLET | Freq: Every day | ORAL | Status: DC
  Administered 2014-02-09 – 2014-02-13 (×4): 50 mg via ORAL
  Filled 2014-02-08 (×7): qty 1

## 2014-02-08 MED ORDER — ACETAMINOPHEN 650 MG RE SUPP: 650.0000 mg | Freq: Four times a day (QID) | RECTAL | Status: DC | PRN

## 2014-02-08 MED ORDER — ONDANSETRON HCL 4 MG PO TABS: 4.0000 mg | ORAL_TABLET | Freq: Four times a day (QID) | ORAL | Status: DC | PRN

## 2014-02-08 MED ORDER — LISINOPRIL 10 MG PO TABS
10.0000 mg | ORAL_TABLET | Freq: Every day | ORAL | Status: DC
  Administered 2014-02-09 – 2014-02-13 (×4): 10 mg via ORAL
  Filled 2014-02-08 (×7): qty 1

## 2014-02-08 MED ORDER — DOCUSATE SODIUM 100 MG PO CAPS
100.0000 mg | ORAL_CAPSULE | Freq: Two times a day (BID) | ORAL | Status: DC
  Administered 2014-02-08 (×2): 100 mg via ORAL
  Filled 2014-02-08 (×4): qty 1

## 2014-02-08 MED ORDER — DOCUSATE SODIUM 100 MG PO CAPS: 100.0000 mg | ORAL_CAPSULE | Freq: Every day | ORAL | Status: DC

## 2014-02-08 MED ORDER — HYDROMORPHONE HCL PF 1 MG/ML IJ SOLN
1.0000 mg | INTRAMUSCULAR | Status: DC | PRN
  Administered 2014-02-08 – 2014-02-09 (×6): 1 mg via INTRAVENOUS
  Filled 2014-02-08 (×7): qty 1

## 2014-02-08 MED ORDER — CILOSTAZOL 100 MG PO TABS
100.0000 mg | ORAL_TABLET | Freq: Two times a day (BID) | ORAL | Status: DC
  Administered 2014-02-08 – 2014-02-14 (×13): 100 mg via ORAL
  Filled 2014-02-08 (×14): qty 1

## 2014-02-08 MED ORDER — SODIUM CHLORIDE 0.9 % IJ SOLN
3.0000 mL | Freq: Two times a day (BID) | INTRAMUSCULAR | Status: DC
  Administered 2014-02-08 – 2014-02-10 (×3): 3 mL via INTRAVENOUS

## 2014-02-08 MED ORDER — EZETIMIBE 10 MG PO TABS
10.0000 mg | ORAL_TABLET | Freq: Every day | ORAL | Status: DC
  Administered 2014-02-08 – 2014-02-13 (×6): 10 mg via ORAL
  Filled 2014-02-08 (×7): qty 1

## 2014-02-08 MED ORDER — HYDROMORPHONE HCL PF 1 MG/ML IJ SOLN
1.0000 mg | Freq: Once | INTRAMUSCULAR | Status: AC
  Administered 2014-02-08: 1 mg via INTRAVENOUS
  Filled 2014-02-08: qty 1

## 2014-02-08 MED ORDER — ONDANSETRON HCL 4 MG/2ML IJ SOLN: 4.0000 mg | Freq: Four times a day (QID) | INTRAMUSCULAR | Status: DC | PRN

## 2014-02-08 MED ORDER — WARFARIN 1.25 MG HALF TABLET
1.2500 mg | ORAL_TABLET | ORAL | Status: DC
  Administered 2014-02-09: 1.25 mg via ORAL
  Filled 2014-02-08: qty 1

## 2014-02-08 MED ORDER — LISINOPRIL 10 MG PO TABS
20.0000 mg | ORAL_TABLET | Freq: Every day | ORAL | Status: DC
  Administered 2014-02-08 – 2014-02-14 (×5): 20 mg via ORAL
  Filled 2014-02-08 (×7): qty 2

## 2014-02-08 MED ORDER — HYDROCODONE-ACETAMINOPHEN 5-325 MG PO TABS
1.0000 | ORAL_TABLET | ORAL | Status: DC | PRN
  Administered 2014-02-08 – 2014-02-10 (×9): 2 via ORAL
  Administered 2014-02-10 (×2): 1 via ORAL
  Administered 2014-02-10 – 2014-02-11 (×3): 2 via ORAL
  Filled 2014-02-08 (×3): qty 2
  Filled 2014-02-08: qty 1
  Filled 2014-02-08 (×8): qty 2
  Filled 2014-02-08: qty 1
  Filled 2014-02-08: qty 2

## 2014-02-08 MED ORDER — WARFARIN - PHARMACIST DOSING INPATIENT
Freq: Every day | Status: DC
  Administered 2014-02-12: 18:00:00

## 2014-02-08 MED ORDER — WARFARIN SODIUM 2.5 MG PO TABS
2.5000 mg | ORAL_TABLET | ORAL | Status: AC
  Administered 2014-02-08: 2.5 mg via ORAL
  Filled 2014-02-08: qty 1
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
## 2014-02-08 NOTE — ED Notes (Addendum)
Care handoff given to floor.

## 2014-02-08 NOTE — ED Notes (Signed)
Pt states the pain is in the lower back toward the tailbone.  Pt is ambulatory with no assistive devices at home.  Pt is rating the pain 2/10 after medication administered.

## 2014-02-08 NOTE — ED Notes (Signed)
Patient transported to X-ray 

## 2014-02-08 NOTE — Progress Notes (Addendum)
[facility] PROGRESS NOTE  [name] STM:[id number] DOB: Feb 01, 1946 DOA: 02/07/2014 PCP: [name], MD  Brief narrative: 68 year old female with past medical history of hypertension, CAD, CABG, atrial fibrillation on coumadin, sigmoid diverticulitis status post sigmoid colectomy and end colostomy who presented from SNF (where she was admitted for wound care) to [facility] ED 02/07/2014 with back pain.  CT of the lumbar spine was done in ED showing no new abnormalities.  Assessment/Plan:  Principal Problem:   Back pain - unclear etiology; possible constipation - no acute findings on CT scan; MRI lumbar spine is pending - pain management with dilaudid 1 mg every 4 hours IV PRN - bowel regimen with colace and miralax BID Active Problems:   Paroxysmal atrial fibrillation - maintaining SR on coumadin  - raee controlled with metoprolol   CAD (coronary artery disease) with CABG - continue aspirin, pletal   HTN (hypertension) - continue lisinopril, metoprolol and imdur   Hyponatremia - likely due to dehydration - will check BMP in am   Dyslipidemia - continue zatia  Code Status: full code Family Communication: no family at the bedside Disposition Plan: home when stable  Leisa Lenz, MD  [facility] Pager [phone]  If 7PM-7AM, please contact night-coverage [email or web address] Password [email or web address] 02/08/2014, 8:58 AM   LOS: 1 day   Consultants:  None   Procedures:  None   Antibiotics:  None   HPI/Subjective: No acute overnight events.   Objective: Filed Vitals:   02/08/14 0510 02/08/14 0533 02/08/14 0554 02/08/14 0600  BP: 104/56 126/73    Pulse: 56 54    Temp:  97.9 F (36.6 C)    TempSrc:  Oral    Resp: 17 16    Height:    5\' 3"  (1.6 m)  Weight:    66.316 kg (146 lb 3.2 oz)  SpO2: 100% 100% 100%     Intake/Output Summary (Last 24 hours) at 02/08/14 0858 Last data filed at 02/08/14 0600  Gross per 24 hour  Intake    480 ml  Output      0 ml  Net     480 ml    Exam:   General:  Pt is alert, follows commands appropriately, not in acute distress  Cardiovascular: Regular rate and rhythm, S1/S2, no murmurs, no rubs, no gallops  Respiratory: Clear to auscultation bilaterally, no wheezing, no crackles, no rhonchi  Abdomen: Soft, non tender, colostomy   Extremities: No edema, pulses DP and PT palpable bilaterally  Neuro: Grossly nonfocal  Data Reviewed: Basic Metabolic Panel:  Recent Labs Lab 02/07/14 2210  NA 130*  K 4.5  CL 90*  CO2 25  GLUCOSE 138*  BUN 16  CREATININE 0.75  CALCIUM 9.5   Liver Function Tests:  Recent Labs Lab 02/07/14 2210  AST 15  ALT 13  ALKPHOS 70  BILITOT <0.2*  PROT 7.5  ALBUMIN 3.6    Recent Labs Lab 02/07/14 2210  LIPASE 28   No results found for this basename: AMMONIA,  in the last 168 hours CBC:  Recent Labs Lab 02/07/14 2210  WBC 10.1  NEUTROABS 6.2  HGB 11.9*  HCT 34.1*  MCV 87.4  PLT 323   Cardiac Enzymes: No results found for this basename: CKTOTAL, CKMB, CKMBINDEX, TROPONINI,  in the last 168 hours BNP: No components found with this basename: POCBNP,  CBG: No results found for this basename: GLUCAP,  in the last 168 hours  No results found for this or any previous visit (  from the past 240 hour(s)).   Studies: Ct Lumbar Spine Wo Contrast  02/08/2014   CLINICAL DATA:  Back pain  EXAM: CT LUMBAR SPINE WITHOUT CONTRAST  TECHNIQUE: Multidetector CT imaging of the lumbar spine was performed without intravenous contrast administration. Multiplanar CT image reconstructions were also generated.  COMPARISON:  12/27/2013 abdominal CT  FINDINGS: No evidence of acute fracture or traumatic subluxation. No evidence of disc or osseous infection. There is a remote fracture of the anterior cortex and superior endplate of H08. No bony retropulsion or subluxation.  There is diffuse disc bulging without focal or advanced disc narrowing or significant canal/foraminal stenosis. Mild  degenerative facet overgrowth at L3-4, L4-5, and L5-S1. There is no perispinal findings to explain back pain.  Diffuse atherosclerosis of the aorta and branch vessels.  IMPRESSION: 1. No acute osseous findings. 2. No evidence of significant canal or foraminal stenosis. 3. Remote T12 body fracture.   Electronically Signed   By: Jorje Guild M.D.   On: 02/08/2014 02:13   Dg Abd Acute W/chest 02/07/2014    IMPRESSION: No evidence of acute cardiopulmonary disease.  No evidence of small bowel obstruction or free air. Left mid abdominal colostomy.  Moderate stool in the right colon, raising the possibility of constipation.     Scheduled Meds: . aspirin EC  81 mg Oral QHS  . cilostazol  100 mg Oral BID  . docusate sodium  100 mg Oral BID  . ezetimibe  10 mg Oral QHS  . isosorbide mononitrate  30 mg Oral Daily  . lisinopril  10 mg Oral QHS  . lisinopril  20 mg Oral Daily  . metoprolol succinate  25 mg Oral Daily  . metoprolol succinate  50 mg Oral QHS  . polyethylene glycol  17 g Oral BID  . pyridOXINE  100 mg Oral Daily  . Warfarin -   Does not apply q1800
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
## 2014-02-08 NOTE — ED Notes (Signed)
Admitting doctor at bedside 

## 2014-02-09 ENCOUNTER — Inpatient Hospital Stay (HOSPITAL_COMMUNITY): Payer: Medicare Other

## 2014-02-09 LAB — CBC
HCT: 31.8 % — ABNORMAL LOW (ref 36.0–46.0)
Hemoglobin: 10.9 g/dL — ABNORMAL LOW (ref 12.0–15.0)
MCH: 30.2 pg (ref 26.0–34.0)
MCHC: 34.3 g/dL (ref 30.0–36.0)
MCV: 88.1 fL (ref 78.0–100.0)
Platelets: 252 10*3/uL (ref 150–400)
RBC: 3.61 MIL/uL — ABNORMAL LOW (ref 3.87–5.11)
RDW: 13 % (ref 11.5–15.5)
WBC: 7 10*3/uL (ref 4.0–10.5)

## 2014-02-09 LAB — COMPREHENSIVE METABOLIC PANEL
ALT: 9 U/L (ref 0–35)
AST: 19 U/L (ref 0–37)
Albumin: 2.8 g/dL — ABNORMAL LOW (ref 3.5–5.2)
Alkaline Phosphatase: 59 U/L (ref 39–117)
BUN: 18 mg/dL (ref 6–23)
CO2: 21 mEq/L (ref 19–32)
Calcium: 8.9 mg/dL (ref 8.4–10.5)
Chloride: 97 mEq/L (ref 96–112)
Creatinine, Ser: 0.63 mg/dL (ref 0.50–1.10)
GFR calc Af Amer: >90 mL/min (ref >90)
GFR calc non Af Amer: 89 mL/min — ABNORMAL LOW (ref >90)
Glucose, Bld: 100 mg/dL — ABNORMAL HIGH (ref 70–99)
Potassium: 4.7 mEq/L (ref 3.7–5.3)
Sodium: 133 mEq/L — ABNORMAL LOW (ref 137–147)
Total Bilirubin: 0.2 mg/dL — ABNORMAL LOW (ref 0.3–1.2)
Total Protein: 6.2 g/dL (ref 6.0–8.3)

## 2014-02-09 LAB — MAGNESIUM: Magnesium: 1.8 mg/dL (ref 1.5–2.5)

## 2014-02-09 LAB — PROTIME-INR
INR: 2.77 — ABNORMAL HIGH (ref 0.00–1.49)
Prothrombin Time: 28.3 seconds — ABNORMAL HIGH (ref 11.6–15.2)

## 2014-02-09 LAB — TSH: TSH: 2.103 u[IU]/mL (ref 0.350–4.500)

## 2014-02-09 LAB — PHOSPHORUS: Phosphorus: 4.6 mg/dL (ref 2.3–4.6)

## 2014-02-09 MED ORDER — GADOBENATE DIMEGLUMINE 529 MG/ML IV SOLN
15.0000 mL | Freq: Once | INTRAVENOUS | Status: AC
  Administered 2014-02-09: 15 mL via INTRAVENOUS

## 2014-02-09 MED ORDER — HYDROMORPHONE HCL PF 1 MG/ML IJ SOLN
1.0000 mg | INTRAMUSCULAR | Status: DC | PRN
  Administered 2014-02-09: 2 mg via INTRAVENOUS
  Administered 2014-02-09: 1 mg via INTRAVENOUS
  Administered 2014-02-09 (×2): 2 mg via INTRAVENOUS
  Administered 2014-02-09 – 2014-02-10 (×8): 1 mg via INTRAVENOUS
  Administered 2014-02-10: 2 mg via INTRAVENOUS
  Administered 2014-02-11 (×2): 1 mg via INTRAVENOUS
  Administered 2014-02-11: 2 mg via INTRAVENOUS
  Administered 2014-02-11 (×5): 1 mg via INTRAVENOUS
  Administered 2014-02-12: 2 mg via INTRAVENOUS
  Administered 2014-02-12 (×2): 1 mg via INTRAVENOUS
  Administered 2014-02-12: 2 mg via INTRAVENOUS
  Administered 2014-02-13 (×3): 1 mg via INTRAVENOUS
  Filled 2014-02-09 (×2): qty 2
  Filled 2014-02-09: qty 1
  Filled 2014-02-09 (×2): qty 2
  Filled 2014-02-09 (×2): qty 1
  Filled 2014-02-09 (×4): qty 2
  Filled 2014-02-09 (×7): qty 1
  Filled 2014-02-09 (×2): qty 2
  Filled 2014-02-09 (×2): qty 1
  Filled 2014-02-09: qty 2
  Filled 2014-02-09: qty 1
  Filled 2014-02-09: qty 2
  Filled 2014-02-09 (×4): qty 1

## 2014-02-09 MED ORDER — HYDROMORPHONE HCL PF 1 MG/ML IJ SOLN
1.0000 mg | INTRAMUSCULAR | Status: DC | PRN
  Administered 2014-02-09: 1 mg via INTRAVENOUS
  Filled 2014-02-09: qty 1

## 2014-02-09 MED ORDER — SENNOSIDES-DOCUSATE SODIUM 8.6-50 MG PO TABS
1.0000 | ORAL_TABLET | Freq: Two times a day (BID) | ORAL | Status: DC
  Administered 2014-02-09 – 2014-02-14 (×11): 1 via ORAL
  Filled 2014-02-09 (×11): qty 1

## 2014-02-09 MED ORDER — ENSURE COMPLETE PO LIQD
237.0000 mL | Freq: Two times a day (BID) | ORAL | Status: DC
  Administered 2014-02-10 – 2014-02-14 (×3): 237 mL via ORAL

## 2014-02-09 MED ORDER — BISACODYL 10 MG RE SUPP
10.0000 mg | Freq: Every day | RECTAL | Status: DC
  Administered 2014-02-10: 10 mg via RECTAL
  Filled 2014-02-09 (×4): qty 1

## 2014-02-09 MED ORDER — TRAMADOL HCL 50 MG PO TABS
50.0000 mg | ORAL_TABLET | Freq: Four times a day (QID) | ORAL | Status: AC | PRN
  Administered 2014-02-11 – 2014-02-12 (×2): 50 mg via ORAL
  Filled 2014-02-09 (×3): qty 1

## 2014-02-09 MED ORDER — ACETAMINOPHEN 325 MG PO TABS
650.0000 mg | ORAL_TABLET | Freq: Four times a day (QID) | ORAL | Status: DC | PRN
  Administered 2014-02-09: 650 mg via ORAL

## 2014-02-09 NOTE — Progress Notes (Signed)
[facility] PROGRESS NOTE  [name] STM:[id number] DOB: December 30, 1945 DOA: 02/07/2014 PCP: Bridget Pink, MD  Brief narrative: Brief narrative:  68 year old female with past medical history of hypertension, CAD, CABG, atrial fibrillation on coumadin, sigmoid diverticulitis status post sigmoid colectomy and end colostomy who presented from SNF (where she was admitted for wound care) to [facility] ED 02/07/2014 with back pain. CT of the lumbar spine was done in ED showing no new abnormalities.   Assessment/Plan:   Principal Problem:  Back pain  - unclear etiology; possible constipation  - no acute findings on CT scan; MRI lumbar spine is pending  - pain management with dilaudid 1-2 mg every 2 hours IV PRN; if pain remains uncontrolled will switch to dilaudid PCA - bowel regimen with colace and miralax BID  Active Problems:  Paroxysmal atrial fibrillation  - maintaining SR on coumadin  - raee controlled with metoprolol  CAD (coronary artery disease) with CABG  - continue aspirin, pletal  HTN (hypertension)  - continue lisinopril, metoprolol and imdur  Hyponatremia  - likely due to dehydration  - will check BMP in am  Dyslipidemia  - continue statin  Constipation - moderate stool burden on imaging studies  - place on Miralax and senna, add bisacodyl suppository   Studies:  Ct Lumbar Spine Wo Contrast   02/08/2014  No acute osseous findings. No evidence of significant canal or foraminal stenosis. Remote T12 body fracture.   Dg Abd Acute W/chest   02/07/2014  No evidence of acute cardiopulmonary disease.  No evidence of SBO, left mid abdominal colostomy.  Moderate stool in the right colon.  Code Status: Full Family Communication: Pt at bedside  Disposition Plan: Possibly in AM  Leisa Lenz, MD  [facility] Pager [phone]  If 7PM-7AM, please contact night-coverage [email or web address] Password [email or web address] 02/09/2014, 9:55 AM   LOS: 2 days    Consultants:  None  Procedures:  None   Antibiotics:  None   HPI/Subjective: No events overnight.  Has back pain, 10/10 in intensity.  Objective: Filed Vitals:   02/09/14 0201 02/09/14 0218 02/09/14 0320 02/09/14 0536  BP: 119/42 136/70 144/72 124/62  Pulse:   72 63  Temp:    98.4 F (36.9 C)  TempSrc:      Resp:    20  Height:      Weight:      SpO2:    94%    Intake/Output Summary (Last 24 hours) at 02/09/14 0955 Last data filed at 02/08/14 1227  Gross per 24 hour  Intake    240 ml  Output      0 ml  Net    240 ml    Exam:   General:  Pt is alert, follows commands appropriately, not in acute distress  Cardiovascular: Regular rate and rhythm, S1/S2, no murmurs, no rubs, no gallops  Respiratory: Clear to auscultation bilaterally, no wheezing, no crackles, no rhonchi  Abdomen: Soft, non tender, non distended, bowel sounds present, no guarding  Extremities: No edema, pulses DP and PT palpable bilaterally  Neuro: Grossly nonfocal  Data Reviewed: Basic Metabolic Panel:  Recent Labs Lab 02/07/14 2210 02/09/14 0520  NA 130* 133*  K 4.5 4.7  CL 90* 97  CO2 25 21  GLUCOSE 138* 100*  BUN 16 18  CREATININE 0.75 0.63  CALCIUM 9.5 8.9  MG  --  1.8  PHOS  --  4.6   Liver Function Tests:  Recent Labs Lab 02/07/14 2210 02/09/14 0520  AST  15 19  ALT 13 9  ALKPHOS 70 59  BILITOT <0.2* 0.2*  PROT 7.5 6.2  ALBUMIN 3.6 2.8*    Recent Labs Lab 02/07/14 2210  LIPASE 28   CBC:  Recent Labs Lab 02/07/14 2210 02/09/14 0520  WBC 10.1 7.0  NEUTROABS 6.2  --   HGB 11.9* 10.9*  HCT 34.1* 31.8*  MCV 87.4 88.1  PLT 323 252   Scheduled Meds: . aspirin EC  81 mg Oral QHS  . cilostazol  100 mg Oral BID  . docusate sodium  100 mg Oral BID  . ezetimibe  10 mg Oral QHS  . isosorbide mononitrate  30 mg Oral Daily  . lisinopril  10 mg Oral QHS  . lisinopril  20 mg Oral Daily  . metoprolol succinate  25 mg Oral Daily  . metoprolol succinate   50 mg Oral QHS  . polyethylene glycol  17 g Oral BID  . pyridOXINE  100 mg Oral Daily  . sodium chloride  3 mL Intravenous Q12H  . warfarin  1.25 mg Oral Once per day on Mon Fri   Continuous Infusions:       \

## 2014-02-09 NOTE — Progress Notes (Addendum)
Manual bp 144/72 pulse 72, c/o back pain Dilaudid 1mg  IV given.Pt allowed to ventilate concerns.Resting quietly at present. Aggie Moats D

## 2014-02-09 NOTE — Progress Notes (Signed)
Patient's blood pressure has been trending 119/94, 111/46, 119/42 throughout the nightshift 3/29-3/30. Patient also had low trends throughout the day on 3/29 as well as reported by RN at shift report. Values for the day shift were 102/50, 141/55, 126/73. Patient reported that her normal blood pressures as 140's/60's and slightly lower or higher on occassions.  Patient was in pain during the night shift and requested pain medication. RN gave patient one dose of 1 mg of Dilaudid at 8:25pm. During the administration patient stated that the IV therapy was burning so RN discontinued administration. Patient then had an objection about the status of the IV located at the right antecubital site. Patient stated that during the day the IV fluids were leaking from the site and she felt that this issue must still be a concern as a result of the burning sensation during the administration of the Dilaudid. Patient stated that IV medication administered during the day was uncomfortable as well but now she was getting a burning sensation in addition to the discomfort she felt during the day. Upon reassessment of the Dilaudid administration RN gave patient one dose of Vicodin two tablets at 325 mg each. Upon reassessment of the Vicodin administration the patient was within the threshold to receive another dose of Dilaudid but had concerns about her low blood pressure and the integrity of the IV site. Patient requested that IV team place her IV because she has "odd veins" as she described. IV was paged approximately three times before they arrived to the unit and got a successful site. RN paged Hospitalists were also paged with concerns of the patients blood pressure and requesting advisement for next steps. Patient stated that her diastolic pressures are to never be below 50 and that her cardiologist would not approve of her receiving pain medication with a number that low. Kirby-Graham, NP with Hospitalists called back with orders  for Ultram 50 mg or Tylenol 650 mg for pain management. RN updated patient and patient requested Tylenol as her choice for pain management. RN administered Tylenol 650 mg per patient request. Patient then had further concerns upon reassessment of Tylenol because her pain was not being managed to her expectations. Patient stated that her pain had not been managed well throughout the night and that it took too long for IV team to come and place her IV and now her pain was out of control. RN called charge nurse into the patients room and shared the patients concern. Charge nurse suggested a manual blood pressure. RN took manual blood pressure of 136/70 and asked if the patient would like pain medication at this time since her blood pressure was within an acceptable range. Patient refused further pain medication from the RN.

## 2014-02-09 NOTE — Consult Note (Addendum)
WOC wound consult note Reason for Consult: Consult requested for abd wound.  Pt has daughter assist with dressing change when at home and uses moist gauze. Pt had colostomy surgery in Feb and is followed by [facility] team prior to admission. Wound type: Abd with full thickness post-op wound Measurement: 10X.3X.1cm Wound bed: 100% red and dry Drainage (amount, consistency, odor) Small amt pink drainage, no odor Periwound: Intact skin surrounding. Dressing procedure/placement/frequency: Continue present plan of care with moist gauze.  Pt can resume follow-up with surgical team after discharge.  Colostomy pouch to LLQ intact with good seal.  Pt states her daughter assists with pouch changes or the staff at the SNF where she was previously staying.  She denies any peristomal problems or difficulty with pouch seal, and states pouches are changed twice a week and denies any further questions or need for assistance.  Stoma red and viable and above skin level when visualized through pouch.  No stool or flatus in pouch at this time. Supplies at bedside for daughter or staff use.  She uses a 2 piece system and barrier ring.  Please re-consult if further assistance is needed.  Thank-you,  Julien Girt MSN, [place], [place], [place], [place]
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
## 2014-02-09 NOTE — Progress Notes (Signed)
ANTICOAGULATION CONSULT NOTE - Follow Up Consult  Pharmacy Consult for Coumadin Indication: atrial fibrillation  Allergies  Allergen Reactions  . Crestor [Rosuvastatin]     Leg pain  . Diltiazem     Weakness on oral Dilt  . Lipitor [Atorvastatin]     Leg pain  . Penicillins Other (See Comments)    Blurred vision  . Phenergan [Promethazine Hcl] Other (See Comments)    Nervous Leg / Restless Leg Syndrome  . Promethazine Other (See Comments)    Uncontrolled leg shaking  . Latex Rash  . Sulfa Antibiotics Photosensitivity and Rash    Burning Rash    Patient Measurements: Height: 5\' 3"  (160 cm) Weight: 146 lb 3.2 oz (66.316 kg) IBW/kg (Calculated) : 52.4  Vital Signs: Temp: 98.4 F (36.9 C) (03/30 0536) BP: 99/50 mmHg (03/30 1300) Pulse Rate: 63 (03/30 0536)  Labs:  Recent Labs  02/07/14 2210 02/08/14 0730 02/09/14 0520  HGB 11.9*  --  10.9*  HCT 34.1*  --  31.8*  PLT 323  --  252  LABPROT  --  23.7* 28.3*  INR  --  2.20* 2.77*  CREATININE 0.75  --  0.63    Estimated Creatinine Clearance: 60.8 ml/min (by C-G formula based on Cr of 0.63).  Assessment:   PT/INR has trended up but remains therapeutic.   Currently on home Coumadin regimen:  2.5 mg daily except 1.25 mg on Mondays and Fridays.  1.25 mg due today (Monday). CBC trended down; no bleeding noted.  Goal of Therapy:  INR 2-3 Monitor platelets by anticoagulation protocol: Yes   Plan:   Coumadin 1.25 mg today, per usual regimen.  Continue daily PT/INR.  Re-check CBC in am.  Arty Baumgartner, [place] Pager: [phone] 02/09/2014,1:46 PM

## 2014-02-10 LAB — CBC
HCT: 30.5 % — ABNORMAL LOW (ref 36.0–46.0)
Hemoglobin: 10.5 g/dL — ABNORMAL LOW (ref 12.0–15.0)
MCH: 30.3 pg (ref 26.0–34.0)
MCHC: 34.4 g/dL (ref 30.0–36.0)
MCV: 88.2 fL (ref 78.0–100.0)
Platelets: 292 10*3/uL (ref 150–400)
RBC: 3.46 MIL/uL — ABNORMAL LOW (ref 3.87–5.11)
RDW: 13.1 % (ref 11.5–15.5)
WBC: 6.5 10*3/uL (ref 4.0–10.5)

## 2014-02-10 LAB — PROTIME-INR
INR: 2.62 — ABNORMAL HIGH (ref 0.00–1.49)
Prothrombin Time: 27.1 seconds — ABNORMAL HIGH (ref 11.6–15.2)

## 2014-02-10 MED ORDER — WARFARIN SODIUM 2.5 MG PO TABS
2.5000 mg | ORAL_TABLET | ORAL | Status: DC
  Administered 2014-02-10 – 2014-02-11 (×2): 2.5 mg via ORAL
  Filled 2014-02-10 (×3): qty 1

## 2014-02-10 NOTE — Progress Notes (Signed)
ANTICOAGULATION CONSULT NOTE - Follow Up Consult  Pharmacy Consult for Coumadin Indication: atrial fibrillation  Allergies  Allergen Reactions  . Crestor [Rosuvastatin]     Leg pain  . Diltiazem     Weakness on oral Dilt  . Lipitor [Atorvastatin]     Leg pain  . Penicillins Other (See Comments)    Blurred vision  . Phenergan [Promethazine Hcl] Other (See Comments)    Nervous Leg / Restless Leg Syndrome  . Promethazine Other (See Comments)    Uncontrolled leg shaking  . Latex Rash  . Sulfa Antibiotics Photosensitivity and Rash    Burning Rash    Patient Measurements: Height: 5\' 3"  (160 cm) Weight: 146 lb 3.2 oz (66.316 kg) IBW/kg (Calculated) : 52.4  Vital Signs: Temp: 97.9 F (36.6 C) (03/31 1300) BP: 102/45 mmHg (03/31 1300) Pulse Rate: 60 (03/31 1300)  Labs:  Recent Labs  02/07/14 2210 02/08/14 0730 02/09/14 0520 02/10/14 0750  HGB 11.9*  --  10.9* 10.5*  HCT 34.1*  --  31.8* 30.5*  PLT 323  --  252 292  LABPROT  --  23.7* 28.3* 27.1*  INR  --  2.20* 2.77* 2.62*  CREATININE 0.75  --  0.63  --     Estimated Creatinine Clearance: 60.8 ml/min (by C-G formula based on Cr of 0.63).  Assessment:   PT/INR remains therapeutic.   Currently on home Coumadin regimen:  2.5 mg daily except 1.25 mg on Mondays and Fridays.  1.25 mg due today (Monday). CBC trended down; no bleeding noted.  Goal of Therapy:  INR 2-3 Monitor platelets by anticoagulation protocol: Yes   Plan:   Coumadin 2.5 mg today, per usual regimen.  Continue daily PT/INR.  CBC already ordered for am, will follow-up.  Arty Baumgartner, [place] Pager: [phone] 02/10/2014,2:34 PM

## 2014-02-10 NOTE — Evaluation (Signed)
Physical Therapy Evaluation and Discharge Patient Details Name: [name] MRN: [id number] DOB: Jul 19, 1946 Today's Date: 02/10/2014   History of Present Illness  68 year old female with past medical history of hypertension, CAD, CABG, atrial fibrillation on coumadin, sigmoid diverticulitis status post sigmoid colectomy and end colostomy who presented from SNF (where she was admitted for wound care) to [facility] ED 02/07/2014 with back pain. CT of the lumbar spine was done in ED showing no new abnormalities.  Clinical Impression  Patient evaluated by Physical Therapy with no further acute PT needs identified. Pt states she wishes to abstain from PT while in the hospital due to physical therapy causing her back pain in the previous venue of care while using PT equipment for exercise. Pt able to perform difficult balance activities without loss of balance. Pertinent education has been completed and the patient has no further questions.  PT is signing off. Thank you for this referral.     Follow Up Recommendations No PT follow up    Equipment Recommendations       Recommendations for Other Services       Precautions / Restrictions Precautions Precautions: None Restrictions Weight Bearing Restrictions: No      Mobility  Bed Mobility Overal bed mobility: Modified Independent             General bed mobility comments: Requires extra time, no physical assist  Transfers Overall transfer level: Needs assistance Equipment used: None Transfers: Sit to/from Stand Sit to Stand: Modified independent (Device/Increase time)         General transfer comment: Pt with minimal sway upon standing, reaches for table to steady herself, did not require physical assist to regain posture  Ambulation/Gait Ambulation/Gait assistance: Modified independent (Device/Increase time) Ambulation Distance (Feet): 150 Feet Assistive device: None Gait Pattern/deviations: Step-through pattern     General  Gait Details: Pt was ambulating with daughter in room when PT entered room. Pt complains of LBP while ambulating. No instances of LOB. Pt cued not to reach for the wall for support.  At no point did PT need to physically assist her to correct balance including making turns and verbal cues for "stop-and-go."  Stairs            Wheelchair Mobility    Modified Rankin (Stroke Patients Only)       Balance Overall balance assessment: Modified Independent (PT stood by for safety)                   Tandem Stance - Right Leg: 15 Tandem Stance - Left Leg: 15 [place] - Eyes Opened: 15 [place]berg - Eyes Closed: 15         Pertinent Vitals/Pain No numerical value given for pain, but pt does state her back hurts when she sits/walks Pt repositioned in chair for comfort.    Home Living Family/patient expects to be discharged to:: Unsure Living Arrangements: Alone Available Help at Discharge: Family;Available PRN/intermittently Type of Home: House Home Access: Stairs to enter Entrance Stairs-Rails: None Entrance Stairs-Number of Steps: 2 Home Layout: One level Home Equipment: Tub bench;Walker - 4 wheels Additional Comments: Pt states she does not want to go back to her previous SNF residence but would either go home with daughter (who agrees to take her on a vacation they have planned) or to a different SNF.    Prior Function Level of Independence: Needs assistance   Gait / Transfers Assistance Needed: Help with bed mobility and sit<>stand  Hand Dominance   Dominant Hand: Right    Extremity/Trunk Assessment   Upper Extremity Assessment: Overall WFL for tasks assessed           Lower Extremity Assessment: Overall WFL for tasks assessed      Cervical / Trunk Assessment: Normal  Communication   Communication: No difficulties  Cognition Arousal/Alertness: Awake/alert Behavior During Therapy: WFL for tasks assessed/performed Overall Cognitive  Status: Within Functional Limits for tasks assessed                      General Comments General comments (skin integrity, edema, etc.): Daughter present for PT eval/treat. Pt states she will not need PT at this time due to "too much therapy will hurt my back." Daugther seems concerned with balance but pt able to perform challenging balance activities without assist. Pt states rolling in the bed aggrivates her back most, but no symptoms noted with truncal rotation in standing to either side. Only able to reproduce pain symptoms of back pain with left hip flexion in sitting.    Exercises        Assessment/Plan    PT Assessment Patent does not need any further PT services  PT Diagnosis Acute pain   PT Problem List Pain  PT Treatment Interventions     PT Goals (Current goals can be found in the Care Plan section) Acute Rehab PT Goals Patient Stated Goal: Back to stop hurting PT Goal Formulation: No goals set, d/c therapy    Frequency     Barriers to discharge        End of Session Equipment Utilized During Treatment: Gait belt Activity Tolerance: Patient tolerated treatment well Patient left: in chair;with call bell/phone within reach;with family/visitor present         Time: 8768-1157 PT Time Calculation (min): 28 min   Charges:   PT Evaluation $Initial PT Evaluation Tier I: 1 Procedure PT Treatments $Self Care/Home Management: 8-22   PT G Codes:        Elayne Snare, [place]   Ellouise Newer 02/10/2014, 1:34 PM
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
## 2014-02-10 NOTE — Progress Notes (Signed)
Patient and daughter are very anxious. Patient stated that she wants nursing staff to check a manual Bp every time PRN pain medicine is given. She wants a manual Bp checked Q4 hours. She wants to be awaken for her pain medicine. Bp remains 470-929'V systolic and 74-73'U dystolic. Patient stated that she wanted cardiology to say it was okay for her to continue taking pain medicines with her Bp staying where it is. Also, patient wanted GI to say it was okay to give the Bisacodyl suppository via the new colostomy. This info given to Dr. Charlies Silvers with instruction received. All Bp meds held this morning. Suppository was given via the stoma. Patient's daughter continues to watch the colostomy bag for output from the suppository. After speaking with Dr. Charlies Silvers at the bedside, the daughter is insisting that the GI consult happen today with GI coming to see her in the hospital today. She feels the CT scan of her abdomen should be repeated. The daughter asked if her mom could be moved to the gastroenterology dept at the hospital. RN  explained there are only Med-Surg dept's and [facility] was very capable of taking care of her. Dr. Charlies Silvers paged about the request for seeing GI today. Will continue to monitor.

## 2014-02-10 NOTE — Progress Notes (Signed)
Patient ID: [name], female   DOB: 1946-01-09, 68 y.o.   MRN: [id number] [facility] PROGRESS NOTE  [name] XNA:[id number] DOB: [birth year] DOA: 02/07/2014 PCP: [name], MD  Brief narrative:  68 year old female with past medical history of hypertension, CAD, CABG, atrial fibrillation on coumadin, sigmoid diverticulitis status post sigmoid colectomy and end colostomy who presented from SNF (where she was admitted for wound care) to [facility] ED 02/07/2014 with back pain. CT of the lumbar spine was done in ED showing no new abnormalities.   Assessment/Plan:  Principal Problem:  Back pain  - unclear etiology; possible constipation  - no acute findings on CT scan; MRI lumbar spine with no acute findings but chronic fractures at T10 and T12 noted  - pain management with dilaudid 1-2 mg every 2 hours IV PRN; if pain remains uncontrolled will switch to dilaudid PCA  - bowel regimen with colace and miralax BID  - PT evaluation  Active Problems:  Paroxysmal atrial fibrillation  - maintaining SR on coumadin  - rate controlled with metoprolol  CAD (coronary artery disease) with CABG  - continue aspirin, pletal  HTN (hypertension)  - continue lisinopril, metoprolol and imdur  Hyponatremia  - likely due to dehydration, trending up: 130 --> 133  - will check BMP in am  Dyslipidemia  - continue statin  Constipation  - moderate stool burden on imaging studies  - placed on Miralax and senna, added bisacodyl suppository   Studies:  Ct Lumbar Spine Wo Contrast 02/08/2014 No acute osseous findings. No evidence of significant canal or foraminal stenosis. Remote T12 body fracture.  Dg Abd Acute W/chest 02/07/2014 No evidence of acute cardiopulmonary disease. No evidence of SBO, left mid abdominal colostomy. Moderate stool in the right colon. Mr Lumbar Spine W Wo Contrast  02/09/2014  Chronic fractures T10 and T12.  No acute fracture or mass.  Mild degenerative change.  Uterine fibroids.      Code Status: Full  Family Communication: Pt at bedside  Disposition Plan: Possibly in AM   Leisa Lenz, MD  [facility] Pager [phone]  If 7PM-7AM, please contact night-coverage [email or web address] Password [facility] 02/10/2014, 10:23 AM   LOS: 3 days   HPI/Subjective: No events overnight.   Objective: Filed Vitals:   02/09/14 2240 02/10/14 0508 02/10/14 0753 02/10/14 0900  BP: 132/64 98/47 114/45 115/46  Pulse: 64 52 64   Temp:  97.8 F (36.6 C)    TempSrc:      Resp:  16    Height:      Weight:      SpO2:  99%      Intake/Output Summary (Last 24 hours) at 02/10/14 1023 Last data filed at 02/10/14 0659  Gross per 24 hour  Intake    360 ml  Output      0 ml  Net    360 ml    Exam:   General:  Pt is alert, follows commands appropriately, not in acute distress  Cardiovascular: Regular rate and rhythm, S1/S2, no murmurs, no rubs, no gallops  Respiratory: Clear to auscultation bilaterally, no wheezing, no crackles, no rhonchi  Abdomen: Soft, non tender, non distended, bowel sounds present, no guarding  Extremities: No edema, pulses DP and PT palpable bilaterally  Neuro: Grossly nonfocal  Data Reviewed: Basic Metabolic Panel:  Recent Labs Lab 02/07/14 2210 02/09/14 0520  NA 130* 133*  K 4.5 4.7  CL 90* 97  CO2 25 21  GLUCOSE 138* 100*  BUN  16 18  CREATININE 0.75 0.63  CALCIUM 9.5 8.9  MG  --  1.8  PHOS  --  4.6   Liver Function Tests:  Recent Labs Lab 02/07/14 2210 02/09/14 0520  AST 15 19  ALT 13 9  ALKPHOS 70 59  BILITOT <0.2* 0.2*  PROT 7.5 6.2  ALBUMIN 3.6 2.8*    Recent Labs Lab 02/07/14 2210  LIPASE 28   CBC:  Recent Labs Lab 02/07/14 2210 02/09/14 0520 02/10/14 0750  WBC 10.1 7.0 6.5  NEUTROABS 6.2  --   --   HGB 11.9* 10.9* 10.5*  HCT 34.1* 31.8* 30.5*  MCV 87.4 88.1 88.2  PLT 323 252 292   Scheduled Meds: . aspirin EC  81 mg Oral QHS  . bisacodyl  10 mg Rectal Daily  . cilostazol  100 mg Oral BID  .  ezetimibe  10 mg Oral QHS  . feeding supplement (ENSURE COMPLETE)  237 mL Oral BID BM  . isosorbide mononitrate  30 mg Oral Daily  . lisinopril  10 mg Oral QHS  . lisinopril  20 mg Oral Daily  . metoprolol succinate  25 mg Oral Daily  . metoprolol succinate  50 mg Oral QHS  . polyethylene glycol  17 g Oral BID  . pyridOXINE  100 mg Oral Daily  . senna-docusate  1 tablet Oral BID  . sodium chloride  3 mL Intravenous Q12H  . warfarin  1.25 mg Oral Once per day on Mon Fri  . Warfarin - Pharmacist Dosing Inpatient   Does not apply q1800   Continuous Infusions:

## 2014-02-11 ENCOUNTER — Inpatient Hospital Stay (HOSPITAL_COMMUNITY): Payer: Medicare Other

## 2014-02-11 DIAGNOSIS — I5022 Chronic systolic (congestive) heart failure: Secondary | ICD-10-CM | POA: Diagnosis present

## 2014-02-11 DIAGNOSIS — I272 Pulmonary hypertension, unspecified: Secondary | ICD-10-CM | POA: Diagnosis present

## 2014-02-11 DIAGNOSIS — I2789 Other specified pulmonary heart diseases: Secondary | ICD-10-CM

## 2014-02-11 DIAGNOSIS — G894 Chronic pain syndrome: Secondary | ICD-10-CM

## 2014-02-11 DIAGNOSIS — I509 Heart failure, unspecified: Secondary | ICD-10-CM

## 2014-02-11 LAB — CBC
HCT: 27.4 % — ABNORMAL LOW (ref 36.0–46.0)
Hemoglobin: 9.3 g/dL — ABNORMAL LOW (ref 12.0–15.0)
MCH: 30.1 pg (ref 26.0–34.0)
MCHC: 33.9 g/dL (ref 30.0–36.0)
MCV: 88.7 fL (ref 78.0–100.0)
Platelets: 277 10*3/uL (ref 150–400)
RBC: 3.09 MIL/uL — ABNORMAL LOW (ref 3.87–5.11)
RDW: 13 % (ref 11.5–15.5)
WBC: 5.6 10*3/uL (ref 4.0–10.5)

## 2014-02-11 LAB — BASIC METABOLIC PANEL
BUN: 9 mg/dL (ref 6–23)
CO2: 28 mEq/L (ref 19–32)
Calcium: 8.2 mg/dL — ABNORMAL LOW (ref 8.4–10.5)
Chloride: 100 mEq/L (ref 96–112)
Creatinine, Ser: 0.56 mg/dL (ref 0.50–1.10)
GFR calc Af Amer: >90 mL/min (ref >90)
GFR calc non Af Amer: >90 mL/min (ref >90)
Glucose, Bld: 96 mg/dL (ref 70–99)
Potassium: 4.8 mEq/L (ref 3.7–5.3)
Sodium: 139 mEq/L (ref 137–147)

## 2014-02-11 LAB — PROTIME-INR
INR: 2.77 — ABNORMAL HIGH (ref 0.00–1.49)
Prothrombin Time: 28.3 seconds — ABNORMAL HIGH (ref 11.6–15.2)

## 2014-02-11 MED ORDER — BISACODYL 10 MG RE SUPP
10.0000 mg | Freq: Every day | RECTAL | Status: DC
  Administered 2014-02-11 – 2014-02-13 (×2): 10 mg via RECTAL

## 2014-02-11 MED ORDER — METHOCARBAMOL 500 MG PO TABS
500.0000 mg | ORAL_TABLET | Freq: Two times a day (BID) | ORAL | Status: DC
  Administered 2014-02-11 – 2014-02-13 (×4): 500 mg via ORAL
  Filled 2014-02-11 (×8): qty 1

## 2014-02-11 MED ORDER — METHOCARBAMOL 100 MG/ML IJ SOLN
500.0000 mg | Freq: Once | INTRAVENOUS | Status: AC
  Administered 2014-02-11: 500 mg via INTRAVENOUS
  Filled 2014-02-11: qty 5

## 2014-02-11 NOTE — Progress Notes (Signed)
ANTICOAGULATION CONSULT NOTE - Follow Up Consult  Pharmacy Consult for Coumadin Indication: atrial fibrillation  Allergies  Allergen Reactions  . Crestor [Rosuvastatin]     Leg pain  . Diltiazem     Weakness on oral Dilt  . Lipitor [Atorvastatin]     Leg pain  . Penicillins Other (See Comments)    Blurred vision  . Phenergan [Promethazine Hcl] Other (See Comments)    Nervous Leg / Restless Leg Syndrome  . Promethazine Other (See Comments)    Uncontrolled leg shaking  . Latex Rash  . Sulfa Antibiotics Photosensitivity and Rash    Burning Rash    Patient Measurements: Height: 5\' 3"  (160 cm) Weight: 146 lb 3.2 oz (66.316 kg) IBW/kg (Calculated) : 52.4  Vital Signs: Temp: 97.8 F (36.6 C) (04/01 0439) BP: 124/61 mmHg (04/01 0439) Pulse Rate: 57 (04/01 0439)  Labs:  Recent Labs  02/09/14 0520 02/10/14 0750 02/11/14 0410  HGB 10.9* 10.5* 9.3*  HCT 31.8* 30.5* 27.4*  PLT 252 292 277  LABPROT 28.3* 27.1* 28.3*  INR 2.77* 2.62* 2.77*  CREATININE 0.63  --  0.56    Estimated Creatinine Clearance: 60.8 ml/min (by C-G formula based on Cr of 0.56).  Assessment: 68 yo F coneinues on home Coumadin regimen for hx afib.  INR is therapeutic.  CBC trending down, likely related to dilution with IV fluids/rehydration.  No bleeding noted.  Home Coumadin regimen: 2.5 mg daily except 1.25 mg on Mondays and Fridays.   Goal of Therapy:  INR 2-3 Monitor platelets by anticoagulation protocol: Yes   Plan:   Coumadin 2.5 mg today, per usual regimen.  Continue daily PT/INR.  [employer], Pharm.D., BCPS Clinical Pharmacist Pager [phone] 02/11/2014 12:46 PM

## 2014-02-11 NOTE — Progress Notes (Signed)
Patient ambulated around unit this evening.  Approx 100'

## 2014-02-11 NOTE — Progress Notes (Addendum)
Patient ID: [name], female   DOB: 01-20-1946, 68 y.o.   MRN: [id number] [facility] Gastroenterology Progress Note  [name] 68 y.o. Mar 31, 1946   Subjective: Complaining of back pain. +N without vomiting. Sitting up on side of bed eating. No BM in ostomy in 18 hours per daughter. Daughter and pt want her orthopedic doctor to review back imaging and they are frustrated with no solution being found for her back pain.  Objective: Vital signs in last 24 hours: Filed Vitals:   02/11/14 0439  BP: 124/61  Pulse: 57  Temp: 97.8 F (36.6 C)  Resp: 16    Physical Exam: Gen: alert, no acute distress Abd: distended, left-sided tenderness near ostomy, wound dressing intact, +BS  Lab Results:  Recent Labs  02/09/14 0520 02/11/14 0410  NA 133* 139  K 4.7 4.8  CL 97 100  CO2 21 28  GLUCOSE 100* 96  BUN 18 9  CREATININE 0.63 0.56  CALCIUM 8.9 8.2*  MG 1.8  --   PHOS 4.6  --     Recent Labs  02/09/14 0520  AST 19  ALT 9  ALKPHOS 59  BILITOT 0.2*  PROT 6.2  ALBUMIN 2.8*    Recent Labs  02/10/14 0750 02/11/14 0410  WBC 6.5 5.6  HGB 10.5* 9.3*  HCT 30.5* 27.4*  MCV 88.2 88.7  PLT 292 277    Recent Labs  02/10/14 0750 02/11/14 0410  LABPROT 27.1* 28.3*  INR 2.62* 2.77*      Assessment/Plan: 68 yo with back pain and decreased ostomy output in the setting of pain meds. Question abdominal source with persisting constipation such as partial small bowel obstruction or colonic ileus/obstruction and will do CT abd/pelvis. Will defer contacting orthopedic doctor to [facility]. Will inform [facility] that pt in hospital. Continue supportive care.   [place] C. 02/11/2014, 12:08 PM

## 2014-02-11 NOTE — Progress Notes (Addendum)
Patient was caught ambulating in room after she stated to staff that she can not walk at all.  Nsg to continue to monitor for status changes.

## 2014-02-11 NOTE — Progress Notes (Signed)
[facility] PROGRESS NOTE  [name] TDD:[id number] DOB: 1946-06-04 DOA: 02/07/2014 PCP: Bridget Pink, MD  Assessment/Plan:  Chronic pain syndrome -Counseled patient that she had no acute findings, negative cord compression, negative compression however did have L-spine DJD/DDD and chronic T-spine fracture -DC patient's PO Vicodin as this is contributing to her constipation -Continue Dilaudid IV -Start Robaxin 500 mg BID -Start:/Hot packs to affected area -PT to see patient in the morning for range of motion exercises and ambulation -After patient's constipation resolved would DC IV pain medication and start patient on tramadol or low-dose PO narcotics. Along with muscle relaxant  Back pain -see chronic pain syndrome  Proximal atrial fibrillation on chronic anticoagulant  -Currently normal sinus rhythm -Continue Coumadin dosing per pharmacy  HTN -Within AHA guidelines -Continue current HTN regimen  Pulmonary hypertension -See chronic systolic CHF  Chronic systolic CHF -Within AHA guidelines -Stable on current regimen  Coronary artery disease -See HTN  Constipation -KUB pending per surgery -Management per surgery (suppository today, possible enema)     Code Status: Full Family Communication: None Disposition Plan: Per GI/surgery   Consultants: PA. [name] (surgery) Dr. Evette Doffing [name] (GI)    Procedures: L-spine MRI with and without contrast 02/09/2014 L2-3: Mild disc and mild facet degeneration.  L3-4: Mild disc and mild facet degeneration without significant  spinal stenosis. There is mild narrowing of the canal.  L4-5: Mild disc and facet degeneration without stenosis  L5-S1: Mild degenerative change. Chronic fractures T10 and T12. No acute fracture or mass.   CT L-spine without contrast 02/08/2014 1. No acute osseous findings.  2. No evidence of significant canal or foraminal stenosis.  3. Remote T12 body  fracture.   Echocardiogram 01/01/2014 - Left ventricle: The cavity size was normal. Wall thickness was normal.  LVEF=60%. - Aortic valve: Sclerosis without stenosis. No significant regurgitation. - Right ventricle: The cavity size was mildly dilated. Systolic function was mildly reduced. - Right atrium: The atrium was mildly dilated. - Pulmonary arteries: PA peak pressure: 67mm Hg (S).        Antibiotics:    HPI/ 68 yo [name] seizures, anxiety, Sjogren's Dz, proximal atrial fibrillation on chronic anticoagulation, chronic systolic CHF, S/P CABG, MI 09/2007, heart murmur, PVD, pulmonary hypertension chronic pain syndrome (back pain), perforated diverticulitis, S/P  Hartman's procedure (ostomy present) by Dr. Greer [name] (surgery) for perforated diverticulitis 12/2013.undergone emergency exploratory laparotomy with sigmoid colectomy with end colostomy. She was discharged On 2/20 to [facility] [place] for wound care. Patient feels like she has strained her back while doing some exercises while at SNF in the beginning of March. Since that time she has had limited mobility due to severe back pain. She was seen by Orthopedics Dr. Jiles [name] she recommended MRI that was scheduled on Monday. Patient's pain was uncontrolled and she presented to ER where A CT of the lumbar spine was done showing no new abnormalities she is being admitted for pain control. No urinary incontinence. Patient reports lower back pain radiating to pelvis. NOTE;  Last seen on 01/15/2014 by Dr. Greer [name] [facility] surgery). 4/1 patient complains of lower back pain which started post PT session at her rehabilitation center. States on the last day post riding a recumbent bike back became extremely painful and patient was unable to ambulate. Request to know the results of her L-spine MRI.     Objective: Filed Vitals:   02/10/14 2034 02/10/14 2304 02/11/14 0439 02/11/14 1511  BP: 122/46 123/48 124/61  105/42  Pulse: 60 65 57 58  Temp: 98.1 F (36.7 C)  97.8 F (36.6 C) 98.3 F (36.8 C)  TempSrc:    Oral  Resp: 16  16 16   Height:      Weight:      SpO2: 100%  100% 95%    Intake/Output Summary (Last 24 hours) at 02/11/14 1553 Last data filed at 02/11/14 3545  Gross per 24 hour  Intake    560 ml  Output    250 ml  Net    310 ml   Filed Weights   02/07/14 2112 02/08/14 0600  Weight: 62.143 kg (137 lb) 66.316 kg (146 lb 3.2 oz)    Exam:   General:  A./O. x4, moderate distress secondary to lower back muscle spasms  Cardiovascular: Regular rate, negative murmurs rubs or gallops DP/PT pulse one plus bilateral  Respiratory: Clear to auscultation bilateral  Abdomen: Colostomy bag in place along left lower quadrant, midline vertical incision healing well negative sign of infection  Musculoskeletal: Negative pedal edema, moderate to severe muscle spasm bilateral paraspinal/inferior latissimus dorsi. Mild midline tenderness to palpation in the L2-S2   Data Reviewed: Basic Metabolic Panel:  Recent Labs Lab 02/07/14 2210 02/09/14 0520 02/11/14 0410  NA 130* 133* 139  K 4.5 4.7 4.8  CL 90* 97 100  CO2 25 21 28   GLUCOSE 138* 100* 96  BUN 16 18 9   CREATININE 0.75 0.63 0.56  CALCIUM 9.5 8.9 8.2*  MG  --  1.8  --   PHOS  --  4.6  --    Liver Function Tests:  Recent Labs Lab 02/07/14 2210 02/09/14 0520  AST 15 19  ALT 13 9  ALKPHOS 70 59  BILITOT <0.2* 0.2*  PROT 7.5 6.2  ALBUMIN 3.6 2.8*    Recent Labs Lab 02/07/14 2210  LIPASE 28   No results found for this basename: AMMONIA,  in the last 168 hours CBC:  Recent Labs Lab 02/07/14 2210 02/09/14 0520 02/10/14 0750 02/11/14 0410  WBC 10.1 7.0 6.5 5.6  NEUTROABS 6.2  --   --   --   HGB 11.9* 10.9* 10.5* 9.3*  HCT 34.1* 31.8* 30.5* 27.4*  MCV 87.4 88.1 88.2 88.7  PLT 323 252 292 277   Cardiac Enzymes: No results found for this basename: CKTOTAL, CKMB, CKMBINDEX, TROPONINI,  in the last 168  hours BNP (last 3 results) No results found for this basename: PROBNP,  in the last 8760 hours CBG: No results found for this basename: GLUCAP,  in the last 168 hours  No results found for this or any previous visit (from the past 240 hour(s)).   Studies: No results found.  Scheduled Meds: . aspirin EC  81 mg Oral QHS  . bisacodyl  10 mg Rectal Daily  . cilostazol  100 mg Oral BID  . ezetimibe  10 mg Oral QHS  . feeding supplement (ENSURE COMPLETE)  237 mL Oral BID BM  . isosorbide mononitrate  30 mg Oral Daily  . lisinopril  10 mg Oral QHS  . lisinopril  20 mg Oral Daily  . metoprolol succinate  25 mg Oral Daily  . metoprolol succinate  50 mg Oral QHS  . polyethylene glycol  17 g Oral BID  . pyridOXINE  100 mg Oral Daily  . senna-docusate  1 tablet Oral BID  . sodium chloride  3 mL Intravenous Q12H  . warfarin  1.25 mg Oral Once per day on Mon Fri  .  warfarin  2.5 mg Oral Once per day on Sun Tue Wed Thu Sat  . Warfarin - Pharmacist Dosing Inpatient   Does not apply q1800   Continuous Infusions:   Principal Problem:   Back pain Active Problems:   Paroxysmal atrial fibrillation, maintaining SR on coumadin    CAD (coronary artery disease) with CABG   HTN (hypertension)   Hyponatremia   Pulmonary hypertension   Chronic systolic CHF (congestive heart failure)   Chronic pain syndrome    Time spent: 24min    Keiasia Christianson, [place], J  [facility] Pager [phone]. If 7PM-7AM, please contact night-coverage at [email or web address], password [facility] [place] 02/11/2014, 3:53 PM  LOS: 4 days

## 2014-02-11 NOTE — Progress Notes (Signed)
Subjective: This patient underwent an emergent Hartman's procedure in February for perforated diverticulitis with colostomy.  She went to rehab after this admission and developed acute lower back pain after riding a recumbent bike with the tension set fairly high.  She saw a sport medicine doctor, who apparently felt like she may have a muscle strain or spasm and wanted to try a muscle relaxant, but was hesitant because the patient had a colostomy.  Therefore, the patient has been getting treated with narcotics, which don't seem to be help the patient's pain much.  In there interim, the patient has been taking iron as well.  She last had a BM on Thursday of last week.  She had a film upon admission Saturday for her back, that revealed a large stool burden on her abdominal xrays.  She was given a suppository with a large results, but hasn't had much since then, except flatus.  She denies any nausea or vomiting.  She is still tolerating a regular diet.  We have been asked to see her for further recommendations   Objective: Vital signs in last 24 hours: Temp:  [97.8 F (36.6 C)-98.1 F (36.7 C)] 97.8 F (36.6 C) (04/01 0439) Pulse Rate:  [57-65] 57 (04/01 0439) Resp:  [16] 16 (04/01 0439) BP: (102-138)/(42-61) 124/61 mmHg (04/01 0439) SpO2:  [100 %] 100 % (04/01 0439) Last BM Date: 02/10/14 Afebrile, VSS Labs OK 3 way film moderate stool in the right colon CT spine show disc disease and L3-4. L4-5, L5-S1.  T12 fx. MR shows chronic fractures T10 + T12 Intake/Output from previous day: 03/31 0701 - 04/01 0700 In: 1200 [P.O.:960; IV Piggyback:240] Out: 1015 [Stool:1015] Intake/Output this shift:    PE: Abd: soft, abdominal wound completely healed.  No further dressing changes need to be done to this.  She oozed a small amount with remove of the "packing" and so a dry gauze dressing was replaced for this purpose only.  Ostomy present with air.  No stool currently.  Stoma is pink and health.   No hernias noted  Lab Results:   Recent Labs  02/10/14 0750 02/11/14 0410  WBC 6.5 5.6  HGB 10.5* 9.3*  HCT 30.5* 27.4*  PLT 292 277    BMET  Recent Labs  02/09/14 0520 02/11/14 0410  NA 133* 139  K 4.7 4.8  CL 97 100  CO2 21 28  GLUCOSE 100* 96  BUN 18 9  CREATININE 0.63 0.56  CALCIUM 8.9 8.2*   PT/INR  Recent Labs  02/10/14 0750 02/11/14 0410  LABPROT 27.1* 28.3*  INR 2.62* 2.77*     Recent Labs Lab 02/07/14 2210 02/09/14 0520  AST 15 19  ALT 13 9  ALKPHOS 70 59  BILITOT <0.2* 0.2*  PROT 7.5 6.2  ALBUMIN 3.6 2.8*     Lipase     Component Value Date/Time   LIPASE 28 02/07/2014 2210     Studies/Results: Mr Lumbar Spine W Wo Contrast  02/09/2014   CLINICAL DATA:  Severe back pain.  Radiculopathy.  EXAM: MRI LUMBAR SPINE WITHOUT AND WITH CONTRAST  TECHNIQUE: Multiplanar and multiecho pulse sequences of the lumbar spine were obtained without and with intravenous contrast.  CONTRAST:  16mL MULTIHANCE GADOBENATE DIMEGLUMINE 529 MG/ML IV SOLN  COMPARISON:  CT lumbar 02/08/2014.  Lumbar MRI 06/23/2013  FINDINGS: Image quality degraded by motion.  Chronic fractures T10 and T12. Negative for acute fracture or mass lesion. Postcontrast imaging reveals normal enhancement.  Myometrial lesions consistent with uterine  fibroids measuring under 2 cm each.  Conus medullaris is normal and terminates at L1-2.  L1-2:  Negative  L2-3:  Mild disc and mild facet degeneration.  L3-4: Mild disc and mild facet degeneration without significant spinal stenosis. There is mild narrowing of the canal.  L4-5:  Mild disc and facet degeneration without stenosis  L5-S1:  Mild degenerative change.  IMPRESSION: Chronic fractures T10 and T12.  No acute fracture or mass.  Mild degenerative change.  Uterine fibroids.   Electronically Signed   By: Franchot Gallo M.D.   On: 02/09/2014 14:38    Medications: . aspirin EC  81 mg Oral QHS  . bisacodyl  10 mg Rectal Daily  . cilostazol  100 mg Oral  BID  . ezetimibe  10 mg Oral QHS  . feeding supplement (ENSURE COMPLETE)  237 mL Oral BID BM  . isosorbide mononitrate  30 mg Oral Daily  . lisinopril  10 mg Oral QHS  . lisinopril  20 mg Oral Daily  . metoprolol succinate  25 mg Oral Daily  . metoprolol succinate  50 mg Oral QHS  . polyethylene glycol  17 g Oral BID  . pyridOXINE  100 mg Oral Daily  . senna-docusate  1 tablet Oral BID  . sodium chloride  3 mL Intravenous Q12H  . warfarin  1.25 mg Oral Once per day on Mon Fri  . warfarin  2.5 mg Oral Once per day on Sun Tue Wed Thu Sat  . Warfarin - Pharmacist Dosing Inpatient   Does not apply q1800    Assessment/Plan 1.  Uncontrolled Back pain 2.  Perforated sigmoid diverticulitis with peritonoits, s/p EXPLORATORY LAPAROTOMY,- Hartman's procedure with splenic flexure mobilization, COLON RESECTION SIGMOID (N/A)  COLOSTOMY, 12/28/13 Dr. Greer Pickerel  OFFICE VISIT - 01/15/14 Post op constipation, no BM 3/26-3/31/15 3. Atrial fibrillation on coumadin 4.  CAD, s/pCABG, 09/2012 5. Hypertension 6.  Anemia 7. Muscle strain/spasm  Plan: 1. I suspect the patient has a muscle spasm or strain in her back causing her pain.  (or some type of musculoskeletal problem).  She has not had a trial of muscle relaxants such as Robaxin.  May want to try this opposed to all the narcotics.  These are likely contributing to her constipation along with her iron supplement.  I have discussed this patient with Dr. Michail Sermon.  I do not feel she needs a CT scan at this time as I do not suspect an obstruction as she has no significant abdominal distention, nausea, or vomiting, and is still passing flatus from her ostomy.  I will get a follow up KUB to evaluate her stool burden.  Cont with suppository today and may need an enema to reach the right colon if unable to with suppository. 2. No further dressing changes need to be done as her wound is healed.   LOS: 4 days    JENNINGS,WILLARD 02/11/2014

## 2014-02-11 NOTE — Progress Notes (Signed)
Per MD, hold suppository until CT scan.

## 2014-02-11 NOTE — Progress Notes (Signed)
Patient has returned from x-ray, MD at bedside.  Ice has helped patient with back pain.  Cardiac monitoring discontinued.  Nsg to continue to monitor for status changes.

## 2014-02-12 LAB — PROTIME-INR
INR: 2.96 — ABNORMAL HIGH (ref 0.00–1.49)
Prothrombin Time: 29.8 seconds — ABNORMAL HIGH (ref 11.6–15.2)

## 2014-02-12 MED ORDER — WARFARIN SODIUM 1 MG PO TABS
1.0000 mg | ORAL_TABLET | Freq: Once | ORAL | Status: AC
  Administered 2014-02-12: 1 mg via ORAL
  Filled 2014-02-12: qty 1

## 2014-02-12 NOTE — Consult Note (Addendum)
WOC ostomy consult note Stoma type/location: Consult requested to irrigate ostomy.  Pt states she has had decreased output since Thursday.   Stomal assessment/size: LLQ colostomy stoma red and viable, 13/4 inches, above skin level.  Daughter at bedside states she applied a new pouch this AM.  Denies any questions or need for assistance with ostomy pouching.   Instilled 500cc warm water into stoma using irrigation cone. Pt tolerated with some cramping.  Return drainage of 250cc with chunks of carrots and brown semi-formed stool.  Pt encouraged to ambulate in room to encourage peristalsis.  Returned to room 15 min later and pt had drained additional 300cc liquid brown stool; states she feels much better.  Re-applied 2 piece pouching system and pt plans to empty when increased output occurs later.   Please re-consult if further assistance is needed.  Thank-you,  Julien Girt MSN, [employer], [place], [place], [place]

## 2014-02-12 NOTE — Progress Notes (Signed)
Subjective: Nothing in the ostomy bag, except a little swipes of stool and gas.  The ostomy looks fine some prolapse.  I used my small finger to digitalize the ostomy, and I did not feel any resistance.  I went about 2 cm into the ostomy.  I did feel some stool at this depth. Pt and daughter are concerned with appearance of skin about 3 o'clock position. They also want some lasix for her lower leg edema.  Objective: Vital signs in last 24 hours: Temp:  [97.9 F (36.6 C)-98.3 F (36.8 C)] 97.9 F (36.6 C) (04/02 0602) Pulse Rate:  [58-64] 64 (04/02 0602) Resp:  [16] 16 (04/02 0602) BP: (105-130)/(42-62) 120/53 mmHg (04/02 0602) SpO2:  [93 %-95 %] 95 % (04/02 0602) Last BM Date: 2014-02-28 No stool recorded from ostomy yesterday 720 PO recorded. Afebrile BP OK HR 50's -60's. NO labs today Film yesterday was normal Intake/Output from previous day: 2023-03-01 0701 - 04/02 0700 In: 720 [P.O.:720] Out: 0  Intake/Output this shift:    General appearance: alert, cooperative and no distress GI: soft, non-tender; bowel sounds normal; no masses,  no organomegaly and ostomy has some old stool in the bag, it does have a fair amout of gas.  The ostomy is pain wtith some protrusion.  Picture below.      Lab Results:   Recent Labs  02/10/14 0750 02/28/2014 0410  WBC 6.5 5.6  HGB 10.5* 9.3*  HCT 30.5* 27.4*  PLT 292 277    BMET  Recent Labs  28-Feb-2014 0410  NA 139  K 4.8  CL 100  CO2 28  GLUCOSE 96  BUN 9  CREATININE 0.56  CALCIUM 8.2*   PT/INR  Recent Labs  Feb 28, 2014 0410 02/12/14 0800  LABPROT 28.3* 29.8*  INR 2.77* 2.96*     Recent Labs Lab 02/07/14 2210 02/09/14 0520  AST 15 19  ALT 13 9  ALKPHOS 70 59  BILITOT <0.2* 0.2*  PROT 7.5 6.2  ALBUMIN 3.6 2.8*     Lipase     Component Value Date/Time   LIPASE 28 02/07/2014 2210     Studies/Results: Dg Abd 2 Views  02-28-14   CLINICAL DATA:  Abdominal pain for 1 week  EXAM: ABDOMEN - 2 VIEW  COMPARISON:   None.  FINDINGS: Scattered large and small bowel gas is noted. No obstructive changes are seen. No free air is noted. An ostomy is noted in the left mid abdomen. No acute bony abnormality is noted.  IMPRESSION: No acute abnormality seen.   Electronically Signed   By: Inez Catalina M.D.   On: 02/28/14 16:37    Medications: . aspirin EC  81 mg Oral QHS  . bisacodyl  10 mg Rectal Daily  . cilostazol  100 mg Oral BID  . ezetimibe  10 mg Oral QHS  . feeding supplement (ENSURE COMPLETE)  237 mL Oral BID BM  . isosorbide mononitrate  30 mg Oral Daily  . lisinopril  10 mg Oral QHS  . lisinopril  20 mg Oral Daily  . methocarbamol  500 mg Oral BID  . metoprolol succinate  25 mg Oral Daily  . metoprolol succinate  50 mg Oral QHS  . polyethylene glycol  17 g Oral BID  . pyridOXINE  100 mg Oral Daily  . senna-docusate  1 tablet Oral BID  . sodium chloride  3 mL Intravenous Q12H  . warfarin  1.25 mg Oral Once per day on Mon Fri  . warfarin  2.5 mg Oral Once per day on Sun Tue Wed Thu Sat  . Warfarin - Pharmacist Dosing Inpatient   Does not apply q1800    Assessment/Plan 1. Uncontrolled Back pain  2. Perforated sigmoid diverticulitis with peritonoits, s/p EXPLORATORY LAPAROTOMY,- Hartman's procedure with splenic flexure mobilization, COLON RESECTION SIGMOID (N/A)  COLOSTOMY, 12/28/13 Dr. Greer Pickerel OFFICE VISIT - 01/15/14  Post op constipation, no BM 3/26-3/31/15  3. Atrial fibrillation on coumadin  4. CAD, s/pCABG, 09/2012  5. Hypertension  6. Anemia  7. Muscle strain/spasm  Plan: I don't see anything wrong with the ostomy.  I will ask ostomy nurses to do a soap suds enema and see if this helps with the blockage.  Her daughter is changing the entire site now. She is on a very good bowel regime at this time.  She is also asking  for lasix.  I will defer that to Medicine.    LOS: 5 days    [name] 02/12/2014
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
## 2014-02-12 NOTE — Progress Notes (Signed)
ANTICOAGULATION CONSULT NOTE - Follow Up Consult  Pharmacy Consult for Coumadin Indication: atrial fibrillation  Allergies  Allergen Reactions  . Crestor [Rosuvastatin]     Leg pain  . Diltiazem     Weakness on oral Dilt  . Lipitor [Atorvastatin]     Leg pain  . Penicillins Other (See Comments)    Blurred vision  . Phenergan [Promethazine Hcl] Other (See Comments)    Nervous Leg / Restless Leg Syndrome  . Promethazine Other (See Comments)    Uncontrolled leg shaking  . Latex Rash  . Sulfa Antibiotics Photosensitivity and Rash    Burning Rash    Patient Measurements: Height: 5\' 3"  (160 cm) Weight: 146 lb 3.2 oz (66.316 kg) IBW/kg (Calculated) : 52.4  Vital Signs: Temp: 97.9 F (36.6 C) (04/02 0602) Temp src: Oral (04/02 0602) BP: 146/64 mmHg (04/02 1046) Pulse Rate: 64 (04/02 0602)  Labs:  Recent Labs  02/10/14 0750 02/11/14 0410 02/12/14 0800  HGB 10.5* 9.3*  --   HCT 30.5* 27.4*  --   PLT 292 277  --   LABPROT 27.1* 28.3* 29.8*  INR 2.62* 2.77* 2.96*  CREATININE  --  0.56  --     Estimated Creatinine Clearance: 60.8 ml/min (by C-G formula based on Cr of 0.56).  Assessment: 68 yo F coneinues on home Coumadin regimen for hx afib.  INR has trended up on home dose.  CBC trending down, likely related to dilution with IV fluids/rehydration.  No bleeding noted.  Home Coumadin regimen: 2.5 mg daily except 1.25 mg on Mondays and Fridays.   Goal of Therapy:  INR 2-3 Monitor platelets by anticoagulation protocol: Yes   Plan:  Reduce Coumadin to 1mg  PO x 1 tonight Continue daily PT/INR.  [employer], Pharm.D., BCPS Clinical Pharmacist Pager [phone] 02/12/2014 1:36 PM

## 2014-02-12 NOTE — Progress Notes (Addendum)
Agree with above, recommend bowel regimen, ambulation, please call back if needed

## 2014-02-12 NOTE — Progress Notes (Signed)
[name] 2:37 PM  Subjective: Patient doing better and her history was reviewed and a recent enema helped and she has no GI complaints  Objective: Vital signs stable afebrile no acute distress abdomen is soft nontender x-ray okay no new labs  Assessment: Increased constipation probably secondary to pain pills and decreased motility  Plan: Please call my partner Dr. Michail Sermon this weekend if we can be of any further assistance otherwise will need things like MiraLax at home periodically and my partner happy to see back when necessary  [facility] E

## 2014-02-12 NOTE — Progress Notes (Signed)
[facility] PROGRESS NOTE  [name] JQB:[id number] DOB: 11-Nov-1946 DOA: 02/07/2014 PCP: [name], MD  Assessment/Plan: Back pain  -DC patient's PO Vicodin as this is contributing to her constipation  -Started on  Robaxin 500 mg BID  -Start:/Hot packs to affected area  -Continue with PT.  -MRI showed old T 10 and T 12 fracture.  -Will consult Ortho per family request.   Proximal atrial fibrillation on chronic anticoagulant  -Currently normal sinus rhythm  -Continue Coumadin dosing per pharmacy   HTN  -Continue current HTN regimen   Chronic systolic CHF  -Stable on current regimen  -no dyspnea.  -Mild edema LE.   Coronary artery disease  -See HTN   Constipation  -KUB negative for obstruction.  -Management per surgery.  Enema ordered.    Code Status: Full Code.  Family Communication: Daughter at bedside.  Disposition Plan: Remain inpatient/    Consultants:  Surgery  GI  Procedures:  none  Antibiotics:  none  HPI/Subjective: Relates back pain some what improved.  Patient asking for lasix for LE edema.  No BM yet. They were wondering if KUB is same or worse.    Objective: Filed Vitals:   02/12/14 1046  BP: 146/64  Pulse:   Temp:   Resp:     Intake/Output Summary (Last 24 hours) at 02/12/14 1118 Last data filed at 02/11/14 2034  Gross per 24 hour  Intake    480 ml  Output      0 ml  Net    480 ml   Filed Weights   02/07/14 2112 02/08/14 0600  Weight: 62.143 kg (137 lb) 66.316 kg (146 lb 3.2 oz)    Exam:   General:  No distress.   Cardiovascular: S 1, S 2 RRR  Respiratory: CTA  Abdomen: Bs present, mild disteneded  Musculoskeletal: trace edema. Neuro exam ; non focal.   Data Reviewed: Basic Metabolic Panel:  Recent Labs Lab 02/07/14 2210 02/09/14 0520 02/11/14 0410  NA 130* 133* 139  K 4.5 4.7 4.8  CL 90* 97 100  CO2 25 21 28   GLUCOSE 138* 100* 96  BUN 16 18 9   CREATININE 0.75 0.63 0.56  CALCIUM 9.5  8.9 8.2*  MG  --  1.8  --   PHOS  --  4.6  --    Liver Function Tests:  Recent Labs Lab 02/07/14 2210 02/09/14 0520  AST 15 19  ALT 13 9  ALKPHOS 70 59  BILITOT <0.2* 0.2*  PROT 7.5 6.2  ALBUMIN 3.6 2.8*    Recent Labs Lab 02/07/14 2210  LIPASE 28   No results found for this basename: AMMONIA,  in the last 168 hours CBC:  Recent Labs Lab 02/07/14 2210 02/09/14 0520 02/10/14 0750 02/11/14 0410  WBC 10.1 7.0 6.5 5.6  NEUTROABS 6.2  --   --   --   HGB 11.9* 10.9* 10.5* 9.3*  HCT 34.1* 31.8* 30.5* 27.4*  MCV 87.4 88.1 88.2 88.7  PLT 323 252 292 277   Cardiac Enzymes: No results found for this basename: CKTOTAL, CKMB, CKMBINDEX, TROPONINI,  in the last 168 hours BNP (last 3 results) No results found for this basename: PROBNP,  in the last 8760 hours CBG: No results found for this basename: GLUCAP,  in the last 168 hours  No results found for this or any previous visit (from the past 240 hour(s)).   Studies: Dg Abd 2 Views  02/11/2014   CLINICAL DATA:  Abdominal pain for 1  week  EXAM: ABDOMEN - 2 VIEW  COMPARISON:  None.  FINDINGS: Scattered large and small bowel gas is noted. No obstructive changes are seen. No free air is noted. An ostomy is noted in the left mid abdomen. No acute bony abnormality is noted.  IMPRESSION: No acute abnormality seen.   Electronically Signed   By: Inez Catalina M.D.   On: 02/11/2014 16:37    Scheduled Meds: . aspirin EC  81 mg Oral QHS  . bisacodyl  10 mg Rectal Daily  . cilostazol  100 mg Oral BID  . ezetimibe  10 mg Oral QHS  . feeding supplement (ENSURE COMPLETE)  237 mL Oral BID BM  . isosorbide mononitrate  30 mg Oral Daily  . lisinopril  10 mg Oral QHS  . lisinopril  20 mg Oral Daily  . methocarbamol  500 mg Oral BID  . metoprolol succinate  25 mg Oral Daily  . metoprolol succinate  50 mg Oral QHS  . polyethylene glycol  17 g Oral BID  . pyridOXINE  100 mg Oral Daily  . senna-docusate  1 tablet Oral BID  . sodium  chloride  3 mL Intravenous Q12H  . warfarin  1.25 mg Oral Once per day on Mon Fri  . warfarin  2.5 mg Oral Once per day on Sun Tue Wed Thu Sat  . Warfarin - Pharmacist Dosing Inpatient   Does not apply q1800   Continuous Infusions:   Principal Problem:   Back pain Active Problems:   Paroxysmal atrial fibrillation, maintaining SR on coumadin    CAD (coronary artery disease) with CABG   HTN (hypertension)   Hyponatremia   Pulmonary hypertension   Chronic systolic CHF (congestive heart failure)   Chronic pain syndrome    Time spent: 35 minutes.     Niel Hummer A  [facility] Pager [phone]. If 7PM-7AM, please contact night-coverage at [email or web address], password [facility] 02/12/2014, 11:18 AM  LOS: 5 days

## 2014-02-12 NOTE — Evaluation (Addendum)
Physical Therapy Evaluation & Discharge Patient Details Name: LIZET KELSO MRN: [id number] DOB: 18-Jan-1946 Today's Date: 02/12/2014   History of Present Illness  68 year old female with past medical history of hypertension, CAD, CABG, atrial fibrillation on coumadin, sigmoid diverticulitis status post sigmoid colectomy and end colostomy who presented from SNF (where she was admitted for wound care) to [facility] ED 02/07/2014 with back pain. CT of the lumbar spine was done in ED showing no new abnormalities.  Clinical Impression  Pt seen on Tuesday for evaluation of functional mobility while on orthopedic unit for acute care and was discharged due to highly level of functioning. Additional evaluation ordered to provide ROM exercises and ambulation following diagnosis of L-spine DJD/DDD and chronic T-spine fracture. Pt evaluated and educated on various exercises, posture techniques, and safe mobility with a neutral spine. No further skilled PT needed at this time. PT signing-off. Thank you for this referral.     Follow Up Recommendations No PT follow up    Equipment Recommendations       Recommendations for Other Services       Precautions / Restrictions Precautions Precautions: None Precaution Comments: PPt has ostomy bag Restrictions Weight Bearing Restrictions: No      Mobility  Bed Mobility Overal bed mobility: Modified Independent             General bed mobility comments: Requires extra time. Pt taught how to log roll to prevent back pain  Transfers Overall transfer level: Modified independent Equipment used: Rolling walker (2 wheeled) Transfers: Sit to/from Stand Sit to Stand: Modified independent (Device/Increase time)         General transfer comment: Pt stands without assist, has been ambulating in room on her own. Gave pt walker for safety  Ambulation/Gait Ambulation/Gait assistance: Modified independent (Device/Increase time) Ambulation Distance (Feet): 30  Feet Assistive device: Rolling walker (2 wheeled) Gait Pattern/deviations: Step-through pattern     General Gait Details: Per nursing report and daughter who was present during therapy, pt has been walking on her own without assist. PT provided RW to use for safety. Cues on how to use RW by not picking it up when ambulating  Stairs            Wheelchair Mobility    Modified Rankin (Stroke Patients Only)       Balance                                             Pertinent Vitals/Pain Pt states she is in pain, does not give numerical value Nursing in room to administer medications Pt repositioned in chair with neutral spine alignment for comfort    Home Living Family/patient expects to be discharged to:: Unsure Living Arrangements: Alone Available Help at Discharge: Family;Available PRN/intermittently Type of Home: House Home Access: Stairs to enter Entrance Stairs-Rails: None Entrance Stairs-Number of Steps: 2 Home Layout: One level Home Equipment: Tub bench;Walker - 4 wheels Additional Comments: Pt states she does not want to go back to her previous SNF residence but would either go home with daughter (who agrees to take her on a vacation they have planned) or to a different SNF.    Prior Function Level of Independence: Needs assistance   Gait / Transfers Assistance Needed: Help with bed mobility and sit<>stand           Hand Dominance  Dominant Hand: Right    Extremity/Trunk Assessment   Upper Extremity Assessment: Overall WFL for tasks assessed           Lower Extremity Assessment: LLE deficits/detail   LLE Deficits / Details: Unable to perform L SLR in supine position, able to perform L SLR if hob slightly elevated. Reports no numbness or tingling.  Cervical / Trunk Assessment: Normal  Communication   Communication: No difficulties  Cognition Arousal/Alertness: Awake/alert Behavior During Therapy: WFL for tasks  assessed/performed Overall Cognitive Status: Within Functional Limits for tasks assessed                      General Comments General comments (skin integrity, edema, etc.): bilater LE edema, MD aware as she was present during therapy. Pt complains of lower back pain of lumbar region and sacrum. No position found to make it worse. Pt educated on posture, bed mobility with neutral spine, transversus abdominis exercises for spinal/core stability. Pt demonstrates knowledge of how to use towel roll for anterior pelvic tilt while sitting.    Exercises Other Exercises Other Exercises: Transversus abdomins contractions in sitting. x10 Other Exercises: heel and toe raises in sitting for edema of LEs x 10 Other Exercises: hooklying with rotation for ROM      Assessment/Plan    PT Assessment Patent does not need any further PT services  PT Diagnosis Acute pain   PT Problem List Pain  PT Treatment Interventions     PT Goals (Current goals can be found in the Care Plan section) Acute Rehab PT Goals Patient Stated Goal: Back to stop hurting PT Goal Formulation: No goals set, d/c therapy    Frequency     Barriers to discharge        Co-evaluation               End of Session   Activity Tolerance: Patient tolerated treatment well Patient left: in chair;with call bell/phone within reach;with family/visitor present Nurse Communication: Mobility status         Time: 1025 (20 minutes interrupted by Nursing/MD)-1127 PT Time Calculation (min): 62 min   Charges:   PT Evaluation $Initial PT Evaluation Tier I: 1 Procedure PT Treatments $Therapeutic Exercise: 8-22 mins $Self Care/Home Management: 8-22   PT G Codes:         Elayne Snare, [place]  Ellouise Newer 02/12/2014, 1:32 PM  Addendum for discharge

## 2014-02-13 ENCOUNTER — Inpatient Hospital Stay (HOSPITAL_COMMUNITY): Payer: Medicare Other

## 2014-02-13 LAB — CBC
HCT: 27.7 % — ABNORMAL LOW (ref 36.0–46.0)
Hemoglobin: 9.5 g/dL — ABNORMAL LOW (ref 12.0–15.0)
MCH: 30.3 pg (ref 26.0–34.0)
MCHC: 34.3 g/dL (ref 30.0–36.0)
MCV: 88.2 fL (ref 78.0–100.0)
Platelets: 267 10*3/uL (ref 150–400)
RBC: 3.14 MIL/uL — ABNORMAL LOW (ref 3.87–5.11)
RDW: 13 % (ref 11.5–15.5)
WBC: 4.8 10*3/uL (ref 4.0–10.5)

## 2014-02-13 LAB — PROTIME-INR
INR: 2.69 — ABNORMAL HIGH (ref 0.00–1.49)
Prothrombin Time: 27.7 seconds — ABNORMAL HIGH (ref 11.6–15.2)

## 2014-02-13 LAB — BASIC METABOLIC PANEL
BUN: 6 mg/dL (ref 6–23)
CO2: 26 mEq/L (ref 19–32)
Calcium: 8.6 mg/dL (ref 8.4–10.5)
Chloride: 100 mEq/L (ref 96–112)
Creatinine, Ser: 0.5 mg/dL (ref 0.50–1.10)
GFR calc Af Amer: >90 mL/min (ref >90)
GFR calc non Af Amer: >90 mL/min (ref >90)
Glucose, Bld: 102 mg/dL — ABNORMAL HIGH (ref 70–99)
Potassium: 4.2 mEq/L (ref 3.7–5.3)
Sodium: 138 mEq/L (ref 137–147)

## 2014-02-13 LAB — C-REACTIVE PROTEIN: CRP: 8.9 mg/dL — ABNORMAL HIGH (ref <0.60)

## 2014-02-13 LAB — SEDIMENTATION RATE: Sed Rate: 108 mm/hr — ABNORMAL HIGH (ref 0–22)

## 2014-02-13 MED ORDER — TRAMADOL HCL 50 MG PO TABS: 50.0000 mg | ORAL_TABLET | Freq: Three times a day (TID) | ORAL | Status: DC | PRN

## 2014-02-13 MED ORDER — WARFARIN 1.25 MG HALF TABLET
1.2500 mg | ORAL_TABLET | Freq: Once | ORAL | Status: AC
  Administered 2014-02-13: 1.25 mg via ORAL
  Filled 2014-02-13: qty 1

## 2014-02-13 MED ORDER — METHOCARBAMOL 500 MG PO TABS: 500.0000 mg | ORAL_TABLET | Freq: Two times a day (BID) | ORAL | Status: DC

## 2014-02-13 MED ORDER — METHOCARBAMOL 500 MG PO TABS
500.0000 mg | ORAL_TABLET | Freq: Two times a day (BID) | ORAL | Status: DC | PRN
  Administered 2014-02-13 – 2014-02-14 (×2): 500 mg via ORAL
  Filled 2014-02-13 (×2): qty 1

## 2014-02-13 MED ORDER — SENNOSIDES-DOCUSATE SODIUM 8.6-50 MG PO TABS: 1.0000 | ORAL_TABLET | Freq: Two times a day (BID) | ORAL | Status: DC

## 2014-02-13 MED ORDER — POLYETHYLENE GLYCOL 3350 17 G PO PACK: 17.0000 g | PACK | Freq: Two times a day (BID) | ORAL | Status: DC

## 2014-02-13 MED ORDER — BISACODYL 10 MG RE SUPP: 10.0000 mg | Freq: Every day | RECTAL | Status: DC | PRN

## 2014-02-13 MED ORDER — ENSURE COMPLETE PO LIQD: 237.0000 mL | Freq: Two times a day (BID) | ORAL | Status: DC

## 2014-02-13 MED ORDER — TRAMADOL HCL 50 MG PO TABS
50.0000 mg | ORAL_TABLET | Freq: Three times a day (TID) | ORAL | Status: DC | PRN
  Administered 2014-02-13 (×2): 50 mg via ORAL
  Filled 2014-02-13 (×2): qty 1

## 2014-02-13 NOTE — Progress Notes (Signed)
NUTRITION FOLLOW-UP  INTERVENTION: - Ensure Complete po BID, each supplement provides 350 kcal and 13 grams of protein. - Pt encouraged to use Ensure Complete at home as needed.   NUTRITION DIAGNOSIS: Inadequate oral intake related to colon resection as evidenced by wt loss; progressing.   Goal: Pt to meet >/= 90% of their estimated nutrition needs, progressing.    Monitor:  Weight trends, po intake, labs, I/O's   ASSESSMENT: History of sigmoid diverticulitis status post sigmoid colectomy and end colostomy who presented from SNF (where she was admitted for wound care) to [facility] ED 02/07/2014 with back pain. CT of the lumbar spine was done in ED showing no new abnormalities.  Per pt her appetite is better and eating well. Discussed using supplements as needed based on her intake both here and at home.   Height: Ht Readings from Last 1 Encounters:  02/08/14 5\' 3"  (1.6 m)    Weight: Wt Readings from Last 1 Encounters:  02/08/14 146 lb 3.2 oz (66.316 kg)  Admission weight: 137 lb (62.1 kg)  BMI:  Body mass index is 25.9 kg/(m^2).  Estimated Nutritional Needs: Kcal: 1800-2000 Protein: 85-95 g Fluid: 1.8-2.0 L/day  Skin: colostomy, incision on abdomen  Diet Order: Cardiac Meal Completion: 75%   Intake/Output Summary (Last 24 hours) at 02/13/14 0950 Last data filed at 02/12/14 2000  Gross per 24 hour  Intake      0 ml  Output    150 ml  Net   -150 ml    Last BM: colostomy 4/2 (enema)  Labs:   Recent Labs Lab 02/09/14 0520 02/11/14 0410 02/13/14 0416  NA 133* 139 138  K 4.7 4.8 4.2  CL 97 100 100  CO2 21 28 26   BUN 18 9 6   CREATININE 0.63 0.56 0.50  CALCIUM 8.9 8.2* 8.6  MG 1.8  --   --   PHOS 4.6  --   --   GLUCOSE 100* 96 102*    CBG (last 3)  No results found for this basename: GLUCAP,  in the last 72 hours  Scheduled Meds: . aspirin EC  81 mg Oral QHS  . bisacodyl  10 mg Rectal Daily  . cilostazol  100 mg Oral BID  . ezetimibe  10 mg Oral QHS   . feeding supplement (ENSURE COMPLETE)  237 mL Oral BID BM  . isosorbide mononitrate  30 mg Oral Daily  . lisinopril  10 mg Oral QHS  . lisinopril  20 mg Oral Daily  . methocarbamol  500 mg Oral BID  . metoprolol succinate  25 mg Oral Daily  . metoprolol succinate  50 mg Oral QHS  . polyethylene glycol  17 g Oral BID  . pyridOXINE  100 mg Oral Daily  . senna-docusate  1 tablet Oral BID  . sodium chloride  3 mL Intravenous Q12H  . Warfarin - Pharmacist Dosing Inpatient   Does not apply q1800    Continuous Infusions:   [place], [place], [place] Pager [phone] After Hours Pager

## 2014-02-13 NOTE — Progress Notes (Signed)
  Subjective: Hoping to go home. Notes abdomen doing fine. Back pain is somewhat better  Objective: Vital signs in last 24 hours: Temp:  [98.1 F (36.7 C)-98.4 F (36.9 C)] 98.4 F (36.9 C) (04/03 0600) Pulse Rate:  [53-66] 62 (04/03 1045) Resp:  [16-18] 16 (04/03 0600) BP: (96-148)/(43-60) 148/60 mmHg (04/03 1045) SpO2:  [93 %-97 %] 97 % (04/03 0600) Last BM Date: 02/13/14  Intake/Output from previous day: 04/02 0701 - 04/03 0700 In: -  Out: 150 [Stool:150] Intake/Output this shift: Total I/O In: 480 [P.O.:480] Out: 250 [Stool:250]  General appearance: cooperative Resp: clear to auscultation bilaterally GI: soft, large stool and air output from stoma, NT Neurologic: Mental status: Alert, oriented, thought content appropriate  Lab Results:   Recent Labs  03/10/14 0410 02/13/14 0416  WBC 5.6 4.8  HGB 9.3* 9.5*  HCT 27.4* 27.7*  PLT 277 267   BMET  Recent Labs  03/10/2014 0410 02/13/14 0416  NA 139 138  K 4.8 4.2  CL 100 100  CO2 28 26  GLUCOSE 96 102*  BUN 9 6  CREATININE 0.56 0.50  CALCIUM 8.2* 8.6   PT/INR  Recent Labs  02/12/14 0800 02/13/14 0416  LABPROT 29.8* 27.7*  INR 2.96* 2.69*   ABG No results found for this basename: PHART, PCO2, PO2, HCO3,  in the last 72 hours  Studies/Results: Dg Abd 2 Views  03-10-14   CLINICAL DATA:  Abdominal pain for 1 week  EXAM: ABDOMEN - 2 VIEW  COMPARISON:  None.  FINDINGS: Scattered large and small bowel gas is noted. No obstructive changes are seen. No free air is noted. An ostomy is noted in the left mid abdomen. No acute bony abnormality is noted.  IMPRESSION: No acute abnormality seen.   Electronically Signed   By: Inez Catalina M.D.   On: 03/10/2014 16:37    Anti-infectives: Anti-infectives   None      Assessment/Plan: Pain med related constipation now resolved. Agree with GI. Back pain likely MS. OK to D/C from our standpoint.  LOS: 6 days    [name] E 02/13/2014

## 2014-02-13 NOTE — Progress Notes (Signed)
ANTICOAGULATION CONSULT NOTE - Follow Up Consult  Pharmacy Consult for Coumadin Indication: atrial fibrillation  Allergies  Allergen Reactions  . Crestor [Rosuvastatin]     Leg pain  . Diltiazem     Weakness on oral Dilt  . Lipitor [Atorvastatin]     Leg pain  . Penicillins Other (See Comments)    Blurred vision  . Phenergan [Promethazine Hcl] Other (See Comments)    Nervous Leg / Restless Leg Syndrome  . Promethazine Other (See Comments)    Uncontrolled leg shaking  . Latex Rash  . Sulfa Antibiotics Photosensitivity and Rash    Burning Rash    Patient Measurements: Height: 5\' 3"  (160 cm) Weight: 146 lb 3.2 oz (66.316 kg) IBW/kg (Calculated) : 52.4  Vital Signs: Temp: 98.4 F (36.9 C) (04/03 0600) BP: 148/60 mmHg (04/03 1045) Pulse Rate: 62 (04/03 1045)  Labs:  Recent Labs  02/11/14 0410 02/12/14 0800 02/13/14 0416  HGB 9.3*  --  9.5*  HCT 27.4*  --  27.7*  PLT 277  --  267  LABPROT 28.3* 29.8* 27.7*  INR 2.77* 2.96* 2.69*  CREATININE 0.56  --  0.50    Estimated Creatinine Clearance: 60.8 ml/min (by C-G formula based on Cr of 0.5).  Assessment: 38 yoF continues on warfarin for hx afib.  INR trended up yesterday to top of range, this morning's INR down to 2.66. Home Coumadin regimen: 2.5 mg daily except 1.25 mg on Mondays and Fridays.  CBC is stable and no bleeding noted.  Goal of Therapy:  INR 2-3 Monitor platelets by anticoagulation protocol: Yes   Plan:  1. Resume home dose of warfarin tonight- 1.25mg  po x1 2. Daily PT/INR 3. Follow for s/s bleeding  Lyza Houseworth D. Adelina Collard, PharmD, BCPS Clinical Pharmacist Pager: [phone] 02/13/2014 11:24 AM

## 2014-02-13 NOTE — Care Management Note (Addendum)
CARE MANAGEMENT NOTE 02/13/2014  Patient:  [name], [name]   Account Number:  [id number]  Date Initiated:  02/13/2014  Documentation initiated by:  Ricki Miller  Subjective/Objective Assessment:   68 yr old female admitted with back pain, has colostomy.     Action/Plan:   Case manager spoke with patient concerning [place] needs at discharge. Patient states she is active with [facility], requesting same RN that she has seen.   Anticipated DC Date:  02/13/2014   Anticipated DC Plan:  [place]  CM consult      [facility] Choice  HOME HEALTH  Resumption Of Svcs/PTA Provider  Choice offered to / List presented to:  C-1 Patient        [place] arranged  [place] RN      [place].   Status of service:  Completed, signed off Medicare Important Message given?   (If response is "NO", the following Medicare IM given date fields will be blank) Date Medicare IM given:   Date Additional Medicare IM given:    Discharge Disposition:  [place]

## 2014-02-13 NOTE — Progress Notes (Signed)
[facility]  Patient Status: Active (receiving services up to time of hospitalization)  [facility] is providing the following services: RN and PT.  PT will be added per CM.  If patient discharges after hours, please call [phone].   Hansel Feinstein 02/13/2014, 3:18 PM

## 2014-02-13 NOTE — Progress Notes (Signed)
[facility] PROGRESS NOTE  [name] STM:[id number] DOB: 1946-07-28 DOA: 02/07/2014 PCP: [name], MD  Assessment/Plan:  AMS, vision changes;  I was called by nurse, due to patient change in mental status. Patient daughter wake up patient, patient had difficulty focusing, unable to see daughter, then she said it was blurry vision. It lasted for few seconds. Patient is back to baseline. No focal deficit on neuro exam. Patient denies vision loss at this time.  -Will order stat CT head. Patient INR at goal. She is at risk for stroke due to history of fib. Also event could be secondary to medications (robaxin and tramadol).  -Symptoms has completed resolved.  -Will also get MRI.   Back pain  -DC patient's PO Vicodin as this is contributing to her constipation  - Robaxin 500 mg BID , change to PRN.  -Start:/Hot packs to affected area  -Continue with PT.  -MRI showed old T 10 and T 12 fracture.  -appreciated Dr duda evaluation.  -CRP and ESR elevated.   Proximal atrial fibrillation on chronic anticoagulant  -Currently normal sinus rhythm  -Continue Coumadin dosing per pharmacy   HTN  -Continue current HTN regimen   Chronic systolic CHF  -Stable on current regimen  -no dyspnea.  -Mild edema LE.   Coronary artery disease  -See HTN   S/P sigmoid colectomy with end colostomy, Constipation,  -KUB negative for obstruction.  -Management per surgery.  Enema ordered.  -surgery doesn't think patient has intra-abdominal infection. No leukocytosis, no fever.  -had good respond to enema.   Code Status: Full Code.  Family Communication: Daughter at bedside.  Disposition Plan: Remain inpatient/    Consultants:  Surgery  GI  Procedures:  none  Antibiotics:  none  HPI/Subjective:    Objective: Filed Vitals:   02/13/14 1506  BP: 136/56  Pulse: 66  Temp:   Resp: 18    Intake/Output Summary (Last 24 hours) at 02/13/14 1549 Last data filed at 02/13/14  1230  Gross per 24 hour  Intake    480 ml  Output    400 ml  Net     80 ml   Filed Weights   02/07/14 2112 02/08/14 0600  Weight: 62.143 kg (137 lb) 66.316 kg (146 lb 3.2 oz)    Exam:   General:  No distress.   Cardiovascular: S 1, S 2 RRR  Respiratory: CTA  Abdomen: Bs present, mild disteneded  Musculoskeletal: trace edema. Neuro exam ; non focal.   Data Reviewed: Basic Metabolic Panel:  Recent Labs Lab 02/07/14 2210 02/09/14 0520 02/11/14 0410 02/13/14 0416  NA 130* 133* 139 138  K 4.5 4.7 4.8 4.2  CL 90* 97 100 100  CO2 _0 GLUCOSE 138* 100* 96 102*  BUN _1 CREATININE 0.75 0.63 0.56 0.50  CALCIUM 9.5 8.9 8.2* 8.6  MG  --  1.8  --   --   PHOS  --  4.6  --   --    Liver Function Tests:  Recent Labs Lab 02/07/14 2210 02/09/14 0520  AST 15 19  ALT 13 9  ALKPHOS 70 59  BILITOT <0.2* 0.2*  PROT 7.5 6.2  ALBUMIN 3.6 2.8*    Recent Labs Lab 02/07/14 2210  LIPASE 28   No results found for this basename: AMMONIA,  in the last 168 hours CBC:  Recent Labs Lab 02/07/14 2210 02/09/14 0520 02/10/14 0750 02/11/14 0410 02/13/14 0416  WBC 10.1 7.0 6.5  5.6 4.8  NEUTROABS 6.2  --   --   --   --   HGB 11.9* 10.9* 10.5* 9.3* 9.5*  HCT 34.1* 31.8* 30.5* 27.4* 27.7*  MCV 87.4 88.1 88.2 88.7 88.2  PLT 323 252 292 277 267   Cardiac Enzymes: No results found for this basename: CKTOTAL, CKMB, CKMBINDEX, TROPONINI,  in the last 168 hours BNP (last 3 results) No results found for this basename: PROBNP,  in the last 8760 hours CBG: No results found for this basename: GLUCAP,  in the last 168 hours  No results found for this or any previous visit (from the past 240 hour(s)).   Studies: Dg Abd 2 Views  02/11/2014   CLINICAL DATA:  Abdominal pain for 1 week  EXAM: ABDOMEN - 2 VIEW  COMPARISON:  None.  FINDINGS: Scattered large and small bowel gas is noted. No obstructive changes are seen. No free air is noted. An ostomy is noted in the left  mid abdomen. No acute bony abnormality is noted.  IMPRESSION: No acute abnormality seen.   Electronically Signed   By: Mark  Lukens M.D.   On: 02/11/2014 16:37    Scheduled Meds: . aspirin EC  81 mg Oral QHS  . bisacodyl  10 mg Rectal Daily  . cilostazol  100 mg Oral BID  . ezetimibe  10 mg Oral QHS  . feeding supplement (ENSURE COMPLETE)  237 mL Oral BID BM  . isosorbide mononitrate  30 mg Oral Daily  . lisinopril  10 mg Oral QHS  . lisinopril  20 mg Oral Daily  . metoprolol succinate  25 mg Oral Daily  . metoprolol succinate  50 mg Oral QHS  . polyethylene glycol  17 g Oral BID  . pyridOXINE  100 mg Oral Daily  . senna-docusate  1 tablet Oral BID  . sodium chloride  3 mL Intravenous Q12H  . warfarin  1.25 mg Oral ONCE-1800  . Warfarin - Pharmacist Dosing Inpatient   Does not apply q1800   Continuous Infusions:   Principal Problem:   Back pain Active Problems:   Paroxysmal atrial fibrillation, maintaining SR on coumadin    CAD (coronary artery disease) with CABG   HTN (hypertension)   Hyponatremia   Pulmonary hypertension   Chronic systolic CHF (congestive heart failure)   Chronic pain syndrome    Time spent: 35 minutes.     [place]al[place], [place] A  [facility] Pager [phone]. If 7PM-7AM, please contact night-coverage at [email or web address], password [email or web address] 02/13/2014, 3:49 PM  LOS: 6 days             

## 2014-02-13 NOTE — Progress Notes (Signed)
Patient ID: [name], female   DOB: 1946-04-27, 68 y.o.   MRN: [id number] [facility] Progress Note  [name] 68 y.o. August 21, 1946   Subjective: Sitting in chair. Bowels moving.  Objective: Vital signs: Filed Vitals:   02/13/14 1045  BP: 148/60  Pulse: 62  Temp: 98.4  Resp: 16    Physical Exam: Gen: alert, no acute distress  Abd: ostomy bag with loose stool and air in it; soft, nontender  Lab Results:  Recent Labs  02/11/14 0410 02/13/14 0416  NA 139 138  K 4.8 4.2  CL 100 100  CO2 28 26  GLUCOSE 96 102*  BUN 9 6  CREATININE 0.56 0.50  CALCIUM 8.2* 8.6   No results found for this basename: AST, ALT, ALKPHOS, BILITOT, PROT, ALBUMIN,  in the last 72 hours  Recent Labs  02/11/14 0410 02/13/14 0416  WBC 5.6 4.8  HGB 9.3* 9.5*  HCT 27.4* 27.7*  MCV 88.7 88.2  PLT 277 267      Assessment/Plan: Constipation due to pain meds and lack of mobility - elevated ESR/CRP nonspecific and I do not think it is due to any acute intra-abdominal process. Continue laxatives and supportive care. Ostomy functioning per surgery. Available to see again this weekend if needed. Call back if any questions otherwise will sign off.   [place] C. 02/13/2014, 11:16 AM

## 2014-02-13 NOTE — Progress Notes (Signed)
Patient ID: [name], female   DOB: 1946/06/25, 68 y.o.   MRN: [id number] Patient's back symptoms seem to be resolving. She has been up ambulating in her room today. Patient states she did have visual changes secondary to taking the Ultram with a muscle relaxant. Patient is having these symptoms worked up. Her C-reactive protein and sedimentation rate were elevated and this is most likely an inflammatory process from her GI surgery. Patient does not have any studies that show any deep retroperitoneal abscess no signs of any significant back pathology.  I will sign off at this time and will followup in the office as needed.  Patient most likely will require skilled nursing placement for rehabilitation and ostomy care.

## 2014-02-14 LAB — PROTIME-INR
INR: 2.4 — ABNORMAL HIGH (ref 0.00–1.49)
Prothrombin Time: 25.4 seconds — ABNORMAL HIGH (ref 11.6–15.2)

## 2014-02-14 MED ORDER — WARFARIN 1.25 MG HALF TABLET: 1.2500 mg | ORAL_TABLET | ORAL | Status: DC

## 2014-02-14 MED ORDER — METHOCARBAMOL 500 MG PO TABS: 500.0000 mg | ORAL_TABLET | Freq: Two times a day (BID) | ORAL | Status: DC | PRN

## 2014-02-14 MED ORDER — WARFARIN SODIUM 2.5 MG PO TABS
2.5000 mg | ORAL_TABLET | ORAL | Status: DC
  Filled 2014-02-14: qty 1

## 2014-02-14 NOTE — Progress Notes (Signed)
ANTICOAGULATION CONSULT NOTE - Follow Up Consult  Pharmacy Consult for Coumadin Indication: atrial fibrillation  Allergies  Allergen Reactions  . Crestor [Rosuvastatin]     Leg pain  . Diltiazem     Weakness on oral Dilt  . Lipitor [Atorvastatin]     Leg pain  . Penicillins Other (See Comments)    Blurred vision  . Phenergan [Promethazine Hcl] Other (See Comments)    Nervous Leg / Restless Leg Syndrome  . Promethazine Other (See Comments)    Uncontrolled leg shaking  . Latex Rash  . Sulfa Antibiotics Photosensitivity and Rash    Burning Rash    Patient Measurements: Height: 5\' 3"  (160 cm) Weight: 146 lb 3.2 oz (66.316 kg) IBW/kg (Calculated) : 52.4  Vital Signs: Temp: 98.1 F (36.7 C) (04/04 0611) Temp src: Oral (04/04 0611) BP: 125/56 mmHg (04/04 0611) Pulse Rate: 87 (04/04 0611)  Labs:  Recent Labs  02/12/14 0800 02/13/14 0416 02/14/14 0500  HGB  --  9.5*  --   HCT  --  27.7*  --   PLT  --  267  --   LABPROT 29.8* 27.7* 25.4*  INR 2.96* 2.69* 2.40*  CREATININE  --  0.50  --     Estimated Creatinine Clearance: 60.8 ml/min (by C-G formula based on Cr of 0.5).  Assessment: 57 yoF continues on warfarin for hx afib.  INR trended up yesterday to top of range, this morning's INR down to 2.4 Home Coumadin regimen: 2.5 mg daily except 1.25 mg on Mondays and Fridays.  CBC is stable and no bleeding noted.  Goal of Therapy:  INR 2-3 Monitor platelets by anticoagulation protocol: Yes   Plan:  1. Resume home dose 2.5 mg daily except 1.25 MF 2. Daily PT/INR 3. Follow for s/s bleeding  Thank you. Anette Guarneri, PharmD [phone]  02/14/2014 9:54 AM

## 2014-02-14 NOTE — Discharge Summary (Signed)
Physician Discharge Summary  SAKIYAH SHUR OZD:[id number] DOB: Mar 31, 1946 DOA: 02/07/2014  PCP: [name], MD  Admit date: 02/07/2014 Discharge date: 02/14/2014  Time spent: 35 minutes  Recommendations for Outpatient Follow-up:  1. Need repeat CRP and ESR.  2. Need CBC to follow up WBC.  Discharge Diagnoses:    Back pain, probably MSK.    Constipation   Transient confusion.    Paroxysmal atrial fibrillation, maintaining SR on coumadin    CAD (coronary artery disease) with CABG   HTN (hypertension)   Hyponatremia   Pulmonary hypertension   Chronic systolic CHF (congestive heart failure)   Chronic pain syndrome   Discharge Condition: stable.   Diet recommendation: Heart Healthy  Filed Weights   02/07/14 2112 02/08/14 0600  Weight: 62.143 kg (137 lb) 66.316 kg (146 lb 3.2 oz)    History of present illness:  Presented with  Patient was recently admitted for sigmoid diverticulitis on 2/15 and undergone emergency exploratory laparotomy with sigmoid colectomy with end colostomy. She was discharged On 2/20 to [facility] for wound care. Patient feels like she has strained her back while doing some exercises while at SNF in the beginning of March. Since that time she has had limited mobility due to severe back pain. She was seen by Orthopedics Dr. Jiles Prows she recommended MRI that was scheduled on Monday. Patient's pain was uncontrolled and she presented to ER where A CT of the lumbar spine was done showing no new abnormalities she is being admitted for pain control. No urinary incontinence. Patient reports lower back pain radiating to pelvis.    Hospital Course:   AMS, vision changes;  I was called by nurse, due to patient change in mental status. Patient daughter wake up patient, patient had difficulty focusing, unable to see daughter, then she said it was blurry vision. It lasted for few seconds. Patient back to baseline. No focal deficit on neuro exam. Patient  denies vision loss at this time.  -CT head negative for stroke.   -Symptoms has completed resolved.  -MRI negative.   Back pain  - Robaxin 500 mg BID , change to PRN.  -Continue with PT.  -MRI showed old T 10 and T 12 fracture.  -appreciated Dr duda evaluation.  -CRP and ESR elevated. GI and Surgeon doesn't think patient has intra abdominal process or infection.  -Back pain improving.   Proximal atrial fibrillation on chronic anticoagulant  -Currently normal sinus rhythm  -Continue Coumadin dosing per pharmacy   HTN  -Continue current HTN regimen   Chronic systolic CHF  -Stable on current regimen  -no dyspnea.  -Mild edema LE.   Coronary artery disease  -See HTN   S/P sigmoid colectomy with end colostomy, Constipation,  -KUB negative for obstruction.  -Management per surgery. Enema ordered.  -surgery doesn't think patient has intra-abdominal infection. No leukocytosis, no fever. She will need repeat ESR and CRP.  -had good respond to enema.    Procedures:  none  Consultations:  Surgery  GI  Dr Sharol Given.   Discharge Exam: Filed Vitals:   02/14/14 0611  BP: 125/56  Pulse: 87  Temp: 98.1 F (36.7 C)  Resp: 18    General: No distress.  Cardiovascular: S 1, S 2 RRR Respiratory: CTA Abdomen ; soft, nt, colostomy in place.   Discharge Instructions You were cared for by a hospitalist during your hospital stay. If you have any questions about your discharge medications or the care you received while you were in  the hospital after you are discharged, you can call the unit and asked to speak with the hospitalist on call if the hospitalist that took care of you is not available. Once you are discharged, your primary care physician will handle any further medical issues. Please note that NO REFILLS for any discharge medications will be authorized once you are discharged, as it is imperative that you return to your primary care physician (or establish a relationship with  a primary care physician if you do not have one) for your aftercare needs so that they can reassess your need for medications and monitor your lab values.  Discharge Orders   Future Appointments Provider Department Dept Phone   02/20/2014 11:30 AM Gayland Curry, MD [facility], [place]   03/16/2014 1:30 PM Troy Sine, MD [facility] [phone]   Future Orders Complete By Expires   Diet - low sodium heart healthy  As directed    Increase activity slowly  As directed        Medication List    STOP taking these medications       docusate sodium 100 MG capsule  Commonly known as:  COLACE     oxyCODONE-acetaminophen 5-325 MG per tablet  Commonly known as:  PERCOCET/ROXICET      TAKE these medications       acetaminophen 650 MG CR tablet  Commonly known as:  TYLENOL  Take 650 mg by mouth every 8 (eight) hours as needed for pain.     ammonium lactate 12 % lotion  Commonly known as:  LAC-HYDRIN  Apply 1 application topically as needed for dry skin.     aspirin EC 81 MG tablet  Take 81 mg by mouth at bedtime.     bisacodyl 10 MG suppository  Commonly known as:  DULCOLAX  Place 1 suppository (10 mg total) rectally daily as needed for moderate constipation.     CALTRATE 600+D 600-800 MG-UNIT Tabs  Generic drug:  Calcium Carb-Cholecalciferol  Take 1 tablet by mouth daily.     cilostazol 100 MG tablet  Commonly known as:  PLETAL  Take 100 mg by mouth 2 (two) times daily.     ezetimibe 10 MG tablet  Commonly known as:  ZETIA  Take 10 mg by mouth at bedtime.     feeding supplement (ENSURE COMPLETE) Liqd  Take 237 mLs by mouth 2 (two) times daily between meals.     hydrocortisone valerate cream 0.2 %  Commonly known as:  WESTCORT  Apply 1 application topically 3 (three) times a week. On random days - for eczema     isosorbide mononitrate 30 MG 24 hr tablet  Commonly known as:  IMDUR  Take 30 mg by mouth daily.     lisinopril 20 MG  tablet  Commonly known as:  PRINIVIL,ZESTRIL  Take 10-20 mg by mouth 2 (two) times daily. Take 1 tablet (20 mg) every morning and 1/2 tablet (10 mg) every night     methocarbamol 500 MG tablet  Commonly known as:  ROBAXIN  Take 1 tablet (500 mg total) by mouth 2 (two) times daily as needed for muscle spasms.     metoprolol succinate 50 MG 24 hr tablet  Commonly known as:  TOPROL-XL  Take 25-50 mg by mouth 2 (two) times daily. Take 1/2 tablet ( $Remove'25mg'OLepUEP$ )  in morning and 1 tablet (50 mg) in the evening.     nitroGLYCERIN 0.4 MG SL tablet  Commonly known as:  NITROSTAT  Place 1 tablet (0.4 mg total) under the tongue every 5 (five) minutes x 3 doses as needed for chest pain.     omega-3 acid ethyl esters 1 G capsule  Commonly known as:  LOVAZA  Take 1 g by mouth daily at 12 noon.     polyethylene glycol packet  Commonly known as:  MIRALAX / GLYCOLAX  Take 17 g by mouth 2 (two) times daily.     pyridOXINE 100 MG tablet  Commonly known as:  VITAMIN B-6  Take 100 mg by mouth daily.     senna-docusate 8.6-50 MG per tablet  Commonly known as:  Senokot-S  Take 1 tablet by mouth 2 (two) times daily.     simvastatin 20 MG tablet  Commonly known as:  ZOCOR  Take 20 mg by mouth daily.     SYSTANE OP  Place 1 drop into both eyes daily.     traMADol 50 MG tablet  Commonly known as:  ULTRAM  Take 1 tablet (50 mg total) by mouth every 8 (eight) hours as needed for moderate pain.     Vitamin D 400 UNITS capsule  Take 400 Units by mouth daily.     warfarin 2.5 MG tablet  Commonly known as:  COUMADIN  Take 1.25-2.5 mg by mouth daily. Take 1/2 tablet (1.25 mg) on Monday and Friday, take 1 tablet (2.5 mg) on all other days       Allergies  Allergen Reactions  . Crestor [Rosuvastatin]     Leg pain  . Diltiazem     Weakness on oral Dilt  . Lipitor [Atorvastatin]     Leg pain  . Penicillins Other (See Comments)    Blurred vision  . Phenergan [Promethazine Hcl] Other (See Comments)     Nervous Leg / Restless Leg Syndrome  . Promethazine Other (See Comments)    Uncontrolled leg shaking  . Latex Rash  . Sulfa Antibiotics Photosensitivity and Rash    Burning Rash       Follow-up Information   Follow up with HEDRICK,JAMES, MD In 1 week.   Specialty:  Family Medicine   Contact information:   [address] [address] [phone]       Follow up with Gayland Curry, MD In 1 week.   Specialty:  General Surgery   Contact information:   [address] [place] [address] [phone]       Follow up with [facility]. (Someone from [facility] will contact you concerning start date for [facility].)    Contact information:   [address] [place] [address] [phone]        The results of significant diagnostics from this hospitalization (including imaging, microbiology, ancillary and laboratory) are listed below for reference.    Significant Diagnostic Studies: Ct Head Wo Contrast  02/13/2014   CLINICAL DATA:  AMS, confusion, vision loss ? elevated INR.  EXAM: CT HEAD WITHOUT CONTRAST  TECHNIQUE: Contiguous axial images were obtained from the base of the skull through the vertex without intravenous contrast.  COMPARISON:  None.  FINDINGS: No acute intracranial abnormality. Specifically, no hemorrhage, hydrocephalus, mass lesion, acute infarction, or significant intracranial injury. No acute calvarial abnormality. Age appropriate global atrophy. Minimal areas of low attenuation within the subcortical and deep white matter regions likely reflecting small vessel white matter ischemic disease. The visualized paranasal sinuses and mastoid air cells are patent.  IMPRESSION: Mild  involutional and chronic changes without evidence of focal acute abnormalities.   Electronically Signed   By: Margaree Mackintosh M.D.   On: 02/13/2014 17:32   Ct Lumbar Spine Wo Contrast  02/08/2014   CLINICAL DATA:  Back pain  EXAM:  CT LUMBAR SPINE WITHOUT CONTRAST  TECHNIQUE: Multidetector CT imaging of the lumbar spine was performed without intravenous contrast administration. Multiplanar CT image reconstructions were also generated.  COMPARISON:  12/27/2013 abdominal CT  FINDINGS: No evidence of acute fracture or traumatic subluxation. No evidence of disc or osseous infection. There is a remote fracture of the anterior cortex and superior endplate of O83. No bony retropulsion or subluxation.  There is diffuse disc bulging without focal or advanced disc narrowing or significant canal/foraminal stenosis. Mild degenerative facet overgrowth at L3-4, L4-5, and L5-S1. There is no perispinal findings to explain back pain.  Diffuse atherosclerosis of the aorta and branch vessels.  IMPRESSION: 1. No acute osseous findings. 2. No evidence of significant canal or foraminal stenosis. 3. Remote T12 body fracture.   Electronically Signed   By: Jorje Guild M.D.   On: 02/08/2014 02:13   Mr Brain Wo Contrast  02/14/2014   CLINICAL DATA:  Change in mental status, evaluate for stroke  EXAM: MRI HEAD WITHOUT CONTRAST  TECHNIQUE: Multiplanar, multiecho pulse sequences of the brain and surrounding structures were obtained without intravenous contrast.  COMPARISON:  Prior CT from earlier the same day.  FINDINGS: The CSF containing spaces are within normal limits for patient age. A few scattered foci of T2/FLAIR hyperintensity are seen within the white matter of both cerebral hemispheres, nonspecific, but likely related to mild chronic microvascular ischemic disease. No mass lesion, midline shift, or extra-axial fluid collection. Ventricles are normal in size without evidence of hydrocephalus.  No diffusion-weighted signal abnormality is identified to suggest acute intracranial infarct. Gray-white matter differentiation is maintained. Normal flow voids are seen within the intracranial vasculature. No intracranial hemorrhage identified.  The cervicomedullary  junction is normal. Pituitary gland is within normal limits. Pituitary stalk is midline. The globes and optic nerves demonstrate a normal appearance with normal signal intensity.  The bone marrow signal intensity is normal. Calvarium is intact. Visualized upper cervical spine is within normal limits.  Scalp soft tissues are unremarkable.  Minimal layering fluid signal intensity seen within the left maxillary sinus. The paranasal sinuses are otherwise clear. No mastoid effusion.  IMPRESSION: Unremarkable MRI of the brain with no acute intracranial infarct or other abnormality identified.   Electronically Signed   By: Jeannine Boga M.D.   On: 02/14/2014 02:13   Mr Lumbar Spine W Wo Contrast  02/09/2014   CLINICAL DATA:  Severe back pain.  Radiculopathy.  EXAM: MRI LUMBAR SPINE WITHOUT AND WITH CONTRAST  TECHNIQUE: Multiplanar and multiecho pulse sequences of the lumbar spine were obtained without and with intravenous contrast.  CONTRAST:  28mL MULTIHANCE GADOBENATE DIMEGLUMINE 529 MG/ML IV SOLN  COMPARISON:  CT lumbar 02/08/2014.  Lumbar MRI 06/23/2013  FINDINGS: Image quality degraded by motion.  Chronic fractures T10 and T12. Negative for acute fracture or mass lesion. Postcontrast imaging reveals normal enhancement.  Myometrial lesions consistent with uterine fibroids measuring under 2 cm each.  Conus medullaris is normal and terminates at L1-2.  L1-2:  Negative  L2-3:  Mild disc and mild facet degeneration.  L3-4: Mild disc and mild facet degeneration without significant spinal stenosis. There is mild narrowing of the canal.  L4-5:  Mild disc and facet degeneration without stenosis  L5-S1:  Mild degenerative change.  IMPRESSION: Chronic fractures T10 and T12.  No acute fracture or mass.  Mild degenerative change.  Uterine fibroids.   Electronically Signed   By: Franchot Gallo M.D.   On: 02/09/2014 14:38   Dg Abd 2 Views  02/11/2014   CLINICAL DATA:  Abdominal pain for 1 week  EXAM: ABDOMEN - 2 VIEW   COMPARISON:  None.  FINDINGS: Scattered large and small bowel gas is noted. No obstructive changes are seen. No free air is noted. An ostomy is noted in the left mid abdomen. No acute bony abnormality is noted.  IMPRESSION: No acute abnormality seen.   Electronically Signed   By: Inez Catalina M.D.   On: 02/11/2014 16:37   Dg Abd Acute W/chest  02/07/2014   CLINICAL DATA:  Low back pain, right lower quadrant abdominal pain  EXAM: ACUTE ABDOMEN SERIES (ABDOMEN 2 VIEW & CHEST 1 VIEW)  COMPARISON:  Chest radiograph dated 12/31/2013  FINDINGS: Lungs are clear.  No pleural effusion or pneumothorax.  The heart is normal in size. Postsurgical changes related to prior CABG.  Nonspecific bowel gas pattern without disproportionate small bowel dilatation to suggest small bowel obstruction. Moderate stool in the right colon. Left mid abdominal colostomy.  Degenerative changes of the lumbar spine.  IMPRESSION: No evidence of acute cardiopulmonary disease.  No evidence of small bowel obstruction or free air. Left mid abdominal colostomy.  Moderate stool in the right colon, raising the possibility of constipation.   Electronically Signed   By: Julian Hy M.D.   On: 02/07/2014 23:03    Microbiology: No results found for this or any previous visit (from the past 240 hour(s)).   Labs: Basic Metabolic Panel:  Recent Labs Lab 02/07/14 2210 02/09/14 0520 02/11/14 0410 02/13/14 0416  NA 130* 133* 139 138  K 4.5 4.7 4.8 4.2  CL 90* 97 100 100  CO2 $Re'25 21 28 26  'Vat$ GLUCOSE 138* 100* 96 102*  BUN $Re'16 18 9 6  'SBq$ CREATININE 0.75 0.63 0.56 0.50  CALCIUM 9.5 8.9 8.2* 8.6  MG  --  1.8  --   --   PHOS  --  4.6  --   --    Liver Function Tests:  Recent Labs Lab 02/07/14 2210 02/09/14 0520  AST 15 19  ALT 13 9  ALKPHOS 70 59  BILITOT <0.2* 0.2*  PROT 7.5 6.2  ALBUMIN 3.6 2.8*    Recent Labs Lab 02/07/14 2210  LIPASE 28   No results found for this basename: AMMONIA,  in the last 168 hours CBC:  Recent  Labs Lab 02/07/14 2210 02/09/14 0520 02/10/14 0750 02/11/14 0410 02/13/14 0416  WBC 10.1 7.0 6.5 5.6 4.8  NEUTROABS 6.2  --   --   --   --   HGB 11.9* 10.9* 10.5* 9.3* 9.5*  HCT 34.1* 31.8* 30.5* 27.4* 27.7*  MCV 87.4 88.1 88.2 88.7 88.2  PLT 323 252 292 277 267   Cardiac Enzymes: No results found for this basename: CKTOTAL, CKMB, CKMBINDEX, TROPONINI,  in the last 168 hours BNP: BNP (last 3 results) No results found for this basename: PROBNP,  in the last 8760 hours CBG: No results found for this basename: GLUCAP,  in the last 168 hours     Signed:  Niel Hummer A  [facility] 02/14/2014, 11:48 AM

## 2014-02-14 NOTE — Progress Notes (Signed)
Discharge instructions and prescriptions given to and reviewed with patient. Patient denies questions or concerns. IV removed. Patient set up with [facility].  Patient discharged via wheelchair accompanied by daughter with personal belongings, discharge packet, and prescriptions.

## 2014-02-16 ENCOUNTER — Telehealth: Payer: Self-pay | Admitting: Cardiovascular Disease

## 2014-02-16 NOTE — Telephone Encounter (Signed)
Returned a call to patient. she had some concerns that her blood pressure was elevated all last night. Patient was recently released from the hospital for severe back pain. still with some slight pain as well as some anxiety about her blood pressure being elevated. She is currently staying with a relative in [place], [place] until she is able to return home to take care of herself. She requested a prescription for amlodipine to be sent to the pharmacy. I explained to her that I cannot order this because it is not on her list of medications. She states that Dr. Claiborne Billings she thinks told her that she could take PRN. I stressed to her that she should just continue the lisinopril as directed. She currently has a lot of factors that can increase her blood pressure i.e. Pain, anxiety, lack of sleep, and not being at home. I also recommended to patient not to take her blood pressure quite so often. ( patient is currently taking her blood pressure every few hours) She needs to get a handle on these other factors and maybe then her pressure will come down. Continue the lisinopril. Maybe check her pressure every other day. If she has the pain under control and not so anxious and her pressure is still up call back to inform. Patient voiced understanding and agreed with plan.

## 2014-02-16 NOTE — Telephone Encounter (Signed)
Message forwarded to [place], [place].

## 2014-02-16 NOTE — Telephone Encounter (Signed)
Please have Mariann Laster to call-she said it was a long message concerning her medicine.She said she would just tell Mariann Laster all the details when she call.

## 2014-02-17 ENCOUNTER — Telehealth (INDEPENDENT_AMBULATORY_CARE_PROVIDER_SITE_OTHER): Payer: Self-pay

## 2014-02-17 NOTE — Telephone Encounter (Signed)
Patient calling into office to see if she can use suppositories.  Patient states she had a small bowel movement thru her rectum and still has some stool that need's to come out and would like to know what would be safe for her to use.  Patient was seen in ED on 02/07/14 and admitted for back pain and constipation. Reviewed with Dr. Barry Dienes and advised to make patient aware that she can take miralax or milk of magnesia.  Patient advised of this but, wants something that will help remove stool from her rectum.  Patient aware that I will send Dr. [name] a message and we will call her back accordingly.  Patient verbalized understanding.  Patient has upcoming appointment on 02/20/14 @ 11:30 am w/Dr. [name].

## 2014-02-18 ENCOUNTER — Telehealth: Payer: Self-pay | Admitting: *Deleted

## 2014-02-18 NOTE — Telephone Encounter (Signed)
Can use suppository like Dulcolax

## 2014-02-18 NOTE — Telephone Encounter (Signed)
Returned signed PT/INR order.

## 2014-02-18 NOTE — Telephone Encounter (Signed)
Called patient and told her per Dr. [name] that she can go and get Dulcolax suppository OTC

## 2014-02-20 ENCOUNTER — Ambulatory Visit (INDEPENDENT_AMBULATORY_CARE_PROVIDER_SITE_OTHER): Payer: Medicare Other | Admitting: General Surgery

## 2014-02-20 ENCOUNTER — Encounter (INDEPENDENT_AMBULATORY_CARE_PROVIDER_SITE_OTHER): Payer: Self-pay | Admitting: General Surgery

## 2014-02-20 VITALS — BP 130/82 | HR 80 | Temp 98.2°F | Resp 16 | Ht 63.0 in | Wt 132.4 lb

## 2014-02-20 DIAGNOSIS — Z933 Colostomy status: Secondary | ICD-10-CM

## 2014-02-20 NOTE — Progress Notes (Signed)
Subjective:     Patient ID: [name], female   DOB: 1946-05-07, 68 y.o.   MRN: [id number]  HPI 68 year old female comes in for followup after undergoing emergency exploratory laparotomy and Hartman's procedure on February 15. She was in the hospital until February 20. I last saw her about 5-1/2 weeks ago. Since that time she was readmitted to the hospital from March 28 through April 4 for musca skeletal pain. She also has some constipation in her ostomy due to increased narcotic usage. Her ostomy was irrigated. Then she started having regular ostomy output. She had a CT of her back and her head while she was in the hospital. There is no evidence of acute problems.She states since discharge she has been doing well. She is slowly getting back to her normal activities. She denies any abdominal pain. She reports normal colostomy output. She did pass a little stool per rectum last week. She reports a good appetite.  Review of Systems     Objective:   Physical Exam BP 130/82  Pulse 80  Temp(Src) 98.2 F (36.8 C) (Oral)  Resp 16  Ht 5\' 3"  (1.6 m)  Wt 132 lb 6.4 oz (60.056 kg)  BMI 23.46 kg/m2  Gen: alert, NAD, non-toxic appearing Abd: soft, nontender, nondistended. Well-healed lower midline incision. No cellulitis. No incisional hernia. Ostomy - left abd - pouch changed. Parastomal skin is in excellent shape! Skin: no rash, no jaundice     Assessment:     Status post Hartman's procedure     Plan:     Overall I think she is doing well. They are taking excellent care of her colostomy. She is scheduled to have a colonoscopy with Dr. Michail Sermon sometime in May or June which I think is appropriate and warranted. I will see her back in late July to discuss colostomy reversal for August or September. The patient is interested in having a PICC line placed preoperatively given her difficult veins. I explained to her that we would discuss that further at her July appointment  [name]. [name], MD,  FACS General, Bariatric, & Minimally Invasive Surgery [facility], [place]

## 2014-02-20 NOTE — Patient Instructions (Signed)
Continue current care Follow up with Dr Michail Sermon for your colonoscopy See you in July to discuss ostomy reversal surgery for August/Sept

## 2014-02-24 ENCOUNTER — Telehealth: Payer: Self-pay | Admitting: *Deleted

## 2014-02-24 MED ORDER — OMEGA-3-ACID ETHYL ESTERS 1 G PO CAPS: 1.0000 g | ORAL_CAPSULE | Freq: Every day | ORAL | Status: DC

## 2014-02-24 NOTE — Telephone Encounter (Signed)
Called patient to inform her that we will be changing her Lovaza to the generic per her insurance companies request. I explained to her that the letter that she received and mailed to [place] is what  They were requesting for her to do. Patient is okay with this and requested the prescription to be sent to [facility] in [place] on [address].

## 2014-02-26 ENCOUNTER — Telehealth: Payer: Self-pay | Admitting: *Deleted

## 2014-02-26 NOTE — Telephone Encounter (Signed)
Left message with operator , Reuben Likes to inform Lenna Sciara that the patient will be cleared for her colonoscopy after her 03/13/14 appointment with Dr. Claiborne Billings. shonda states that she will inform Melissa of my message.

## 2014-03-05 ENCOUNTER — Telehealth: Payer: Self-pay | Admitting: *Deleted

## 2014-03-05 ENCOUNTER — Ambulatory Visit (INDEPENDENT_AMBULATORY_CARE_PROVIDER_SITE_OTHER): Payer: Medicare Other | Admitting: Physician Assistant

## 2014-03-05 ENCOUNTER — Ambulatory Visit (INDEPENDENT_AMBULATORY_CARE_PROVIDER_SITE_OTHER): Payer: Medicare Other | Admitting: Pharmacist Clinician (PhC)/ Clinical Pharmacy Specialist

## 2014-03-05 ENCOUNTER — Telehealth: Payer: Self-pay | Admitting: Physician Assistant

## 2014-03-05 ENCOUNTER — Encounter: Payer: Self-pay | Admitting: Physician Assistant

## 2014-03-05 VITALS — BP 128/70 | HR 81 | Ht 63.0 in | Wt 131.7 lb

## 2014-03-05 DIAGNOSIS — I4891 Unspecified atrial fibrillation: Secondary | ICD-10-CM

## 2014-03-05 DIAGNOSIS — I2510 Atherosclerotic heart disease of native coronary artery without angina pectoris: Secondary | ICD-10-CM

## 2014-03-05 DIAGNOSIS — Z7901 Long term (current) use of anticoagulants: Secondary | ICD-10-CM

## 2014-03-05 DIAGNOSIS — R079 Chest pain, unspecified: Secondary | ICD-10-CM

## 2014-03-05 DIAGNOSIS — I10 Essential (primary) hypertension: Secondary | ICD-10-CM

## 2014-03-05 LAB — POCT INR: INR: 2.8

## 2014-03-05 NOTE — Patient Instructions (Signed)
1.  We will schedule a Lexiscan myovue stress test. 2.  Follow up with Dr. Claiborne Billings in a month depending on the study results
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
## 2014-03-05 NOTE — Assessment & Plan Note (Addendum)
Atypical.  Comes and goes.  Only last seconds.  May or may not be responding to SL NTG.  However, she has had some radiation to her right jaw.  She's had a lot of stress lately with a major surgery and is in an inflammatory state with multiple joints in pain.  Elevated CRP and ESR.  She had an NST one year ago.  Will order a lexiscan myoview.  She is supposed to be referred to rheumatology.

## 2014-03-05 NOTE — Telephone Encounter (Signed)
Note reviewed; agree with [facility]

## 2014-03-05 NOTE — Telephone Encounter (Signed)
Dr. Claiborne Billings - Please review OV note from today w/ Tarri Fuller, PA-C.

## 2014-03-05 NOTE — Telephone Encounter (Signed)
Pt was her earlier to see [facility]. She wants Gaspar Bidding to make sure that Dr Claiborne Billings reviews her office visit from today.

## 2014-03-05 NOTE — Assessment & Plan Note (Signed)
Controlled.  

## 2014-03-06 NOTE — Telephone Encounter (Signed)
Returned call and pt verified x 2.  Pt informed message received and Dr. Claiborne Billings reviewed note and agreed w/ plan by Tarri Fuller, PA-C.  Pt verbalized understanding and agreed w/ plan.

## 2014-03-11 ENCOUNTER — Telehealth: Payer: Self-pay | Admitting: Cardiovascular Disease

## 2014-03-11 ENCOUNTER — Telehealth (HOSPITAL_COMMUNITY): Payer: Self-pay

## 2014-03-11 NOTE — Telephone Encounter (Signed)
Dr Claiborne Billings will be aware at the time of her visit that a stress test was done.

## 2014-03-11 NOTE — Telephone Encounter (Signed)
She wanted you to know that she is having stress test here on Friday ar 1:30. She has an appt on Monday,03-16-14 with Dr Amado Nash he have her stress test results back?

## 2014-03-11 NOTE — Telephone Encounter (Signed)
Returned call to patient. Advised that the stress test results should be available by her OV on 5/4 after the test on 5/1. Will informed Mariann Laster & Dr. Claiborne Billings of this as well

## 2014-03-12 ENCOUNTER — Other Ambulatory Visit: Payer: Self-pay | Admitting: Rheumatology

## 2014-03-13 ENCOUNTER — Ambulatory Visit (HOSPITAL_COMMUNITY)
Admission: RE | Admit: 2014-03-13 | Discharge: 2014-03-13 | Disposition: A | Discharge status: Home / Self Care | Payer: Medicare Other | Source: Ambulatory Visit | Attending: Cardiovascular Disease | Admitting: Cardiovascular Disease

## 2014-03-13 DIAGNOSIS — R5381 Other malaise: Secondary | ICD-10-CM | POA: Insufficient documentation

## 2014-03-13 DIAGNOSIS — R5383 Other fatigue: Secondary | ICD-10-CM | POA: Insufficient documentation

## 2014-03-13 DIAGNOSIS — R0609 Other forms of dyspnea: Secondary | ICD-10-CM | POA: Insufficient documentation

## 2014-03-13 DIAGNOSIS — R079 Chest pain, unspecified: Secondary | ICD-10-CM | POA: Insufficient documentation

## 2014-03-13 DIAGNOSIS — R0989 Other specified symptoms and signs involving the circulatory and respiratory systems: Secondary | ICD-10-CM | POA: Insufficient documentation

## 2014-03-13 DIAGNOSIS — R42 Dizziness and giddiness: Secondary | ICD-10-CM | POA: Insufficient documentation

## 2014-03-13 MED ORDER — TECHNETIUM TC 99M SESTAMIBI GENERIC - CARDIOLITE
29.6000 | Freq: Once | INTRAVENOUS | Status: AC | PRN
  Administered 2014-03-13: 30 via INTRAVENOUS

## 2014-03-13 MED ORDER — REGADENOSON 0.4 MG/5ML IV SOLN
0.4000 mg | Freq: Once | INTRAVENOUS | Status: AC
  Administered 2014-03-13: 0.4 mg via INTRAVENOUS

## 2014-03-13 MED ORDER — TECHNETIUM TC 99M SESTAMIBI GENERIC - CARDIOLITE
9.9000 | Freq: Once | INTRAVENOUS | Status: AC | PRN
  Administered 2014-03-13: 10 via INTRAVENOUS

## 2014-03-13 MED ORDER — AMINOPHYLLINE 25 MG/ML IV SOLN
75.0000 mg | Freq: Once | INTRAVENOUS | Status: AC
  Administered 2014-03-13: 75 mg via INTRAVENOUS

## 2014-03-13 NOTE — Procedures (Addendum)
[facility] [address] [place] [address] [phone]  Cardiology Nuclear Med Study  [name] is a 68 y.o. female     MRN : [id number]     DOB: June 15, 1946  Procedure Date: 03/13/2014  Nuclear Med Background Indication for Stress Test:  [facility] History:  CAD;CABG X3-09/2007;PAF w/RVR;HM;seizures;CHF;Last NUC MPI on 07/06/2011-nonischemic;EF=75% Cardiac Risk Factors: Family History - CAD, History of Smoking, Hypertension, Lipids and PVD  Symptoms:  Chest Pain, Dizziness, DOE, Fatigue and Light-Headedness   Nuclear Pre-Procedure Caffeine/Decaff Intake:  1:00am NPO After: 11am   IV Site: R Hand  IV 0.9% NS with Angio Cath:  22g  Chest Size (in):  n/a IV Started by: Rolene Course, RN  Height: 5\' 3"  (1.6 m)  Cup Size: C  BMI:  Body mass index is 23.21 kg/(m^2). Weight:  131 lb (59.421 kg)   Tech Comments:  n/a    Nuclear Med Study 1 or 2 day study: 1 day  Stress Test Type:  [place] Provider:  Shelva Majestic, MD   Resting Radionuclide: Technetium 88m Sestamibi  Resting Radionuclide Dose: 9.9 mCi   Stress Radionuclide:  Technetium 16m Sestamibi  Stress Radionuclide Dose: 29.6 mCi           Stress Protocol Rest HR: 56 Stress HR: 78  Rest BP: 145/87 Stress BP: 149/71  Exercise Time (min): n/a METS: n/a   Predicted Max HR: 151 bpm % Max HR: 54.3 bpm Rate Pressure Product: 12218  Dose of Adenosine (mg):  n/a Dose of Lexiscan: 0.4 mg  Dose of Atropine (mg): n/a Dose of Dobutamine: n/a mcg/kg/min (at max HR)  Stress Test Technologist: Leane Para, CCT Nuclear Technologist: Imagene Riches, CNMT   Rest Procedure:  Myocardial perfusion imaging was performed at rest 45 minutes following the intravenous administration of Technetium 46m Sestamibi. Stress Procedure:  The patient received IV Lexiscan 0.4 mg over 15-seconds.  Technetium 59m Sestamibi injected at 30-seconds.  Patient experienced SOB and 75 mg of Aminophylline IV was administered at 5 minutes. There were no significant changes with Lexiscan.  Quantitative spect images were obtained after a 45 minute delay.  Transient Ischemic Dilatation (Normal <1.22):  1.04 Lung/Heart Ratio (Normal <0.45):  0.23 QGS EDV:  67 ml QGS ESV:  18 ml LV Ejection Fraction: 74%     Rest ECG: NSR with non-specific ST-T wave changes  Stress ECG: No significant change from baseline ECG  QPS Raw Data Images:  Normal; no motion artifact; normal heart/lung ratio. Stress Images:  There is a tiny area of reduced tracer uptake in the basal anterior septum Rest Images:  There is improved basal anteroseptal perfusion. Subtraction (SDS):  These findings are consistent with ischemia. LV Wall Motion:  NL LV Function; NL Wall Motion EF 74%  Impression Exercise Capacity:  Lexiscan with no exercise. BP Response:  Normal blood pressure response. Clinical Symptoms:  No significant symptoms noted. ECG Impression:  No significant ECG changes with Lexiscan. Comparison with Prior Nuclear Study: Compared to 2012 there is a tiny area of basal anteroseptal ischemia. Consider high grade LAD stenosis with a patent distal bypass.   Overall Impression:  Low risk stress nuclear study with a tiny area of basal anteroseptal ischemia.Sanda Klein, MD  03/13/2014 4:45 PM

## 2014-03-16 ENCOUNTER — Ambulatory Visit (INDEPENDENT_AMBULATORY_CARE_PROVIDER_SITE_OTHER): Payer: Medicare Other | Admitting: Cardiovascular Disease

## 2014-03-16 VITALS — BP 153/78 | HR 62 | Ht 63.0 in | Wt 131.3 lb

## 2014-03-16 DIAGNOSIS — E785 Hyperlipidemia, unspecified: Secondary | ICD-10-CM

## 2014-03-16 DIAGNOSIS — Z933 Colostomy status: Secondary | ICD-10-CM

## 2014-03-16 DIAGNOSIS — I480 Paroxysmal atrial fibrillation: Secondary | ICD-10-CM

## 2014-03-16 DIAGNOSIS — I509 Heart failure, unspecified: Secondary | ICD-10-CM

## 2014-03-16 DIAGNOSIS — I10 Essential (primary) hypertension: Secondary | ICD-10-CM

## 2014-03-16 DIAGNOSIS — I2510 Atherosclerotic heart disease of native coronary artery without angina pectoris: Secondary | ICD-10-CM

## 2014-03-16 DIAGNOSIS — I4891 Unspecified atrial fibrillation: Secondary | ICD-10-CM

## 2014-03-16 DIAGNOSIS — Z7901 Long term (current) use of anticoagulants: Secondary | ICD-10-CM

## 2014-03-16 DIAGNOSIS — I5022 Chronic systolic (congestive) heart failure: Secondary | ICD-10-CM

## 2014-03-16 NOTE — Patient Instructions (Signed)
Your physician recommends that you schedule a follow-up appointment in: 4 months  Your physician has recommended you make the following change in your medication: Increase Metoprolol to 50 mg twice daily  \

## 2014-03-17 LAB — CULTURE, BLOOD (SINGLE)

## 2014-03-20 NOTE — Telephone Encounter (Signed)
Encounter Closed---5/8 TP

## 2014-03-23 ENCOUNTER — Encounter: Payer: Self-pay | Admitting: *Deleted

## 2014-03-25 NOTE — Telephone Encounter (Signed)
ok 

## 2014-03-27 ENCOUNTER — Encounter: Payer: Self-pay | Admitting: Cardiovascular Disease
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
## 2014-03-30 ENCOUNTER — Ambulatory Visit: Payer: Self-pay | Admitting: Rheumatology

## 2014-03-30 ENCOUNTER — Telehealth: Payer: Self-pay | Admitting: Cardiovascular Disease

## 2014-03-30 NOTE — Telephone Encounter (Signed)
She faxed over on 03-24-14 a clarence to hold her Coumadin for 5 days before her colonoscopy on 04-08-14.Please fax [phone]

## 2014-03-30 NOTE — Telephone Encounter (Signed)
Had Dr. Claiborne Billings to sign colonoscopy  clearance. Faxed to [employer].

## 2014-03-30 NOTE — Telephone Encounter (Signed)
Forward  Defer to Dr Arnette Norris CMA

## 2014-04-01 ENCOUNTER — Ambulatory Visit (INDEPENDENT_AMBULATORY_CARE_PROVIDER_SITE_OTHER): Payer: Medicare Other | Admitting: Pharmacist Clinician (PhC)/ Clinical Pharmacy Specialist

## 2014-04-01 DIAGNOSIS — I4891 Unspecified atrial fibrillation: Secondary | ICD-10-CM

## 2014-04-01 DIAGNOSIS — Z7901 Long term (current) use of anticoagulants: Secondary | ICD-10-CM

## 2014-04-01 LAB — POCT INR: INR: 2.4

## 2014-04-03 ENCOUNTER — Ambulatory Visit: Payer: Medicare Other | Admitting: Pharmacist Clinician (PhC)/ Clinical Pharmacy Specialist

## 2014-04-08 ENCOUNTER — Other Ambulatory Visit: Payer: Self-pay | Admitting: Gastroenterology

## 2014-04-09 ENCOUNTER — Telehealth: Payer: Self-pay | Admitting: Pharmacist Clinician (PhC)/ Clinical Pharmacy Specialist

## 2014-04-09 NOTE — Telephone Encounter (Signed)
Pt LMOM, had colonoscopy yesterday, 2 small polyps removed.  GI asked that she hold warfarin and aspirin x 5 days and restart both on June 1.

## 2014-04-09 NOTE — Telephone Encounter (Signed)
Spoke with patient, advised that if she starts warfarin on Saturday, INR levels will start to become measurable by Monday.  Advised to start aspirin on Monday as directed.  Has appt for INR on 6.10.15

## 2014-04-22 ENCOUNTER — Ambulatory Visit (INDEPENDENT_AMBULATORY_CARE_PROVIDER_SITE_OTHER): Payer: Medicare Other | Admitting: Pharmacist Clinician (PhC)/ Clinical Pharmacy Specialist

## 2014-04-22 DIAGNOSIS — I4891 Unspecified atrial fibrillation: Secondary | ICD-10-CM

## 2014-04-22 DIAGNOSIS — Z7901 Long term (current) use of anticoagulants: Secondary | ICD-10-CM

## 2014-04-22 LAB — POCT INR: INR: 1.6

## 2014-04-23 ENCOUNTER — Encounter (INDEPENDENT_AMBULATORY_CARE_PROVIDER_SITE_OTHER): Payer: Self-pay | Admitting: General Surgery

## 2014-04-23 ENCOUNTER — Other Ambulatory Visit (HOSPITAL_COMMUNITY): Payer: Self-pay | Admitting: Diagnostic Radiology

## 2014-04-23 ENCOUNTER — Ambulatory Visit (INDEPENDENT_AMBULATORY_CARE_PROVIDER_SITE_OTHER): Payer: Medicare Other | Admitting: General Surgery

## 2014-04-23 ENCOUNTER — Other Ambulatory Visit (INDEPENDENT_AMBULATORY_CARE_PROVIDER_SITE_OTHER): Payer: Self-pay | Admitting: General Surgery

## 2014-04-23 VITALS — BP 116/62 | HR 77 | Temp 98.6°F | Resp 18 | Ht 63.0 in | Wt 134.0 lb

## 2014-04-23 DIAGNOSIS — Z933 Colostomy status: Secondary | ICD-10-CM

## 2014-04-23 DIAGNOSIS — Z9889 Other specified postprocedural states: Secondary | ICD-10-CM

## 2014-04-23 NOTE — Patient Instructions (Addendum)
Clear liquids the day before surgery Drink plenty of liquids the day before surgery Do two fleets enemas the day before surgery (one in the morning and one in the evening) Do bowel prep the day before surgery (including antibiotics) Stay active  End Colostomy Reversal An end colostomy reversal is surgery that reverses an end colostomy. The large intestine is disconnected from the opening in the abdomen (stoma). It is then reconnected to the large intestine inside the body. A stoma and pouch are no longer needed. Bowel movements can resume through the rectum. LET YOUR CAREGIVER KNOW ABOUT:  Allergies to food or medicine.  Medicines taken, including vitamins, health supplements, herbs, eyedrops, over-the-counter medicines, and creams.  Use of steroids (by mouth or creams).  Previous problems with anesthetics or numbing medicines.  History of bleeding problems or blood clots.  Previous surgery.  Other health problems, including diabetes and kidney problems.  Possibility of pregnancy, if this applies. RISKS AND COMPLICATIONS General surgical complications may include:  Reaction to anesthesia.  Damage to surrounding nerves, tissues, or structures.  Blood clot.  Bleeding.  Scarring. Specific risks for colostomy reversal, while rare, may include:  Intestinal paralysis (ileus). This is a normal part of recovery. It usually goes away in 3 to 7 days. However, it can last longer in some people.  Leaking at the joined part of the intestine (anastamotic leak).  Infection of the surgical cut (incision) or the place where the stoma was located.  A collection of pus (abscess) in the abdomen or pelvis.  Intestinal blockage.  Narrowing at the joined part of the intestine (stricture).  Urinary and sexual dysfunction. BEFORE THE PROCEDURE It is important to follow your surgeon's instructions prior to your procedure. This will help you to avoid complications. Steps before your  procedure may include:  A physical exam, rectal exam, X-rays, colonoscopy, and other procedures.  Chemotherapy or radiation therapy if the stoma was created for cancer.  A review of the procedure, the anesthesia being used, and what to expect after the procedure. You may be asked to:  Stop taking certain medicines for several days prior to your procedure. This may include blood thinners (such as aspirin).  Take certain medicines, such as antibiotics or stool softeners.  Avoid eating and drinking after midnight the night before the procedure. This will help you to avoid complications from the anesthesia.  Quit smoking. Smoking increases the chances of a healing problem after your procedure. PROCEDURE  You will be given medicine that makes you sleep (general anesthetic). The procedure may be done as open surgery, with a large incision. It may also be done as laparoscopic surgery, with several smaller incisions. The surgeon will stitch or staple the intestine ends back together. This surgery takes several hours. AFTER THE PROCEDURE  You will be given pain medicine.  Your caregivers will slowly increase your diet and movement.  You can expect to be in the hospital for about 5 to 10 days.  You should arrange for someone to help you with activities at home while you recover. Document Released: 01/22/2012 Document Reviewed: 01/22/2012 [facility] Patient Information 2014 [place].

## 2014-04-24 DIAGNOSIS — Z933 Colostomy status: Secondary | ICD-10-CM | POA: Insufficient documentation

## 2014-04-24 NOTE — Progress Notes (Signed)
Subjective:     Patient ID: [name], female   DOB: 1946-01-06, 68 y.o.   MRN: [id number]  HPI 68 year old female comes in for followup. Last time I saw her was April 10. She had previously undergone a exploratory laparotomy, Hartmann's procedure on February 15 for perforated sigmoid diverticulitis. She states that she is doing really well. She denies any abdominal pain. She denies any nausea vomiting. She reports a good appetite. She reports normal colostomy function. She recently underwent a colonoscopy on May 27 which showed a few small mouth diverticula in the descending colon. She had 2 flat and semi-sessile polyps in the proximal sigmoid colon and descending colon. There was limited visualization of the rectal pouch a large amount of mucus but no mass lesion seen. The biopsy results from the polyps showed hyperplastic polyp no adenomatous change or malignancy. She is now walking around 2 miles a day. She is interested in getting her ostomy reversed in late summer  PMHx, PSHx, SOCHx, FAMHx, ALL reviewed   Review of Systems A 10 point Review of systems was performed and all systems are negative except for what is mentioned in the history of present illness     Objective:   Physical Exam  Vitals reviewed. Constitutional: She is oriented to person, place, and time. She appears well-developed and well-nourished. No distress.  HENT:  Head: Normocephalic and atraumatic.  Right Ear: External ear normal.  Left Ear: External ear normal.  Eyes: Conjunctivae are normal. No scleral icterus.  Neck: Normal range of motion. Neck supple. No tracheal deviation present. No thyromegaly present.  Cardiovascular: Normal rate and normal heart sounds.   Pulmonary/Chest: Effort normal and breath sounds normal. No stridor. No respiratory distress. She has no wheezes.  Abdominal: Soft. She exhibits no distension. There is no tenderness. There is no rebound and no guarding.    Musculoskeletal: She  exhibits no edema and no tenderness.  Lymphadenopathy:    She has no cervical adenopathy.  Neurological: She is alert and oriented to person, place, and time. She exhibits normal muscle tone.  Skin: Skin is warm and dry. No rash noted. She is not diaphoretic. No erythema.  Psychiatric: She has a normal mood and affect. Her behavior is normal. Judgment and thought content normal.       Assessment:     Status post exploratory laparotomy Hartman's procedure for perforated sigmoid diverticulitis History of paroxysmal atrial fibrillation on chronic Coumadin     Plan:     Overall I think she is doing well. 6 months from February will be around mid August and we are going to shoot for that time period for Hartman's reversal. We will attempt to do a laparoscopic-assisted Hartman's reversal.  I discussed the procedure in detail.  The patient was given Neurosurgeon.  We discussed the risks and benefits of surgery including, but not limited to bleeding, infection (such as wound infection, abdominal abscess), injury to surrounding structures, blood clot formation, urinary retention, incisional hernia, anastomotic stricture, anastomotic leak, anesthesia risks, pulmonary & cardiac complications such as pneumonia &/or heart attack, need for additional procedures, ileus, & prolonged hospitalization.  We discussed the typical postoperative recovery course, including limitations & restrictions postoperatively. I explained that the likelihood of improvement in their symptoms is good.  We will need to get permission from her cardiologist Dr. Claiborne Billings to hold her Coumadin preoperatively.  She was also instructed to do a bowel prep prior surgery. She will get MiraLAX and oral antibiotics. Also asked  her to do 2 enemas to help clean out her rectal pouch.  She also has difficult peripheral IV access. She is very interested in obtaining a PICC line preoperatively. I explained that have to investigate logistics  of that being able to be accomplished. If we're not able to get a PICC line preoperatively, then we discussed anesthesia placing a central line the morning of surgery   We will also set her up for a barium enema to evaluate her rectal pouch prior to surgery.  All of her questions were asked and answered. [name]. [name], MD, FACS General, Bariatric, & Minimally Invasive Surgery [facility], [place]

## 2014-04-29 ENCOUNTER — Encounter (INDEPENDENT_AMBULATORY_CARE_PROVIDER_SITE_OTHER): Payer: Self-pay

## 2014-05-02 ENCOUNTER — Telehealth (INDEPENDENT_AMBULATORY_CARE_PROVIDER_SITE_OTHER): Payer: Self-pay | Admitting: General Surgery

## 2014-05-02 NOTE — Telephone Encounter (Signed)
Called pt to let her know I believe we can get her a PICC line inserted the day before surgery by IR. Explained that once we get permission from her cardiologist to hold coumadin preoperatively, our office will contact her to schedule surgery for August. Once we have surgical date, my assistant will schedule her PICC line for the day before surgery.

## 2014-05-04 ENCOUNTER — Telehealth: Payer: Self-pay | Admitting: *Deleted

## 2014-05-04 ENCOUNTER — Telehealth: Payer: Self-pay | Admitting: Cardiovascular Disease

## 2014-05-04 NOTE — Telephone Encounter (Signed)
Dr. Dois Davenport office notified to fax a surgical clearance and Dr. Claiborne Billings will sign.  Mariann Laster notified to be on the look out for this form.

## 2014-05-04 NOTE — Telephone Encounter (Signed)
I called the patient about what you talked about doing the PICC line insertion the day before surgery by IR. I asked her about her apt with Dr Claiborne Billings and she stated that she saw an nurse they checked her INR. I looked in the epic and they have her done to see Dr Claiborne Billings on 08-06-2014. I told her that I sent everything to Dr Claiborne Billings that we need clearance for surgery. She stated that Dr Claiborne Billings has been out of the office and she stated that she will call his nurse to see if she can be cleared or if she can move her apt up with Dr Claiborne Billings, I told her that to call back and ask for me. And I told her that if they do clear her that we are all are in epic and Dr. Claiborne Billings nurse can send me a message

## 2014-05-04 NOTE — Telephone Encounter (Signed)
Signed surgical clearance sent to [facility] surgery. O to hold ASA/Coumadin x 5 days, restart ASAP.

## 2014-05-04 NOTE — Telephone Encounter (Signed)
Pt is going to have colon surgery in August,her doctor is waiting for clarence from Dr Claiborne Billings. He said they sent the request on 6-11 and still have not received it. Please call the pt when this is taken care of. Please fax to the attention of Maryclare Bean at Dr Sula Rumple office. She did not have the fax number.[email or web address]

## 2014-05-06 ENCOUNTER — Ambulatory Visit (INDEPENDENT_AMBULATORY_CARE_PROVIDER_SITE_OTHER): Payer: Medicare Other | Admitting: Pharmacist Clinician (PhC)/ Clinical Pharmacy Specialist

## 2014-05-06 DIAGNOSIS — Z7901 Long term (current) use of anticoagulants: Secondary | ICD-10-CM

## 2014-05-06 DIAGNOSIS — I4891 Unspecified atrial fibrillation: Secondary | ICD-10-CM

## 2014-05-06 LAB — POCT INR: INR: 3.5

## 2014-05-18 ENCOUNTER — Telehealth: Payer: Self-pay | Admitting: Cardiovascular Disease

## 2014-05-18 MED ORDER — ISOSORBIDE MONONITRATE ER 30 MG PO TB24: 30.0000 mg | ORAL_TABLET | Freq: Every day | ORAL | Status: DC

## 2014-05-18 MED ORDER — EZETIMIBE 10 MG PO TABS: 10.0000 mg | ORAL_TABLET | Freq: Every day | ORAL | Status: DC

## 2014-05-18 NOTE — Telephone Encounter (Signed)
Rx refills sent to patient pharmacy for 30 day supply

## 2014-05-18 NOTE — Telephone Encounter (Signed)
Patient needs refills on Isosorbide Mon 30 mg # 30  1 daily.  Zetia 10 mg 1 daily---would like for [place] to cancel the prescription for 90 day supply and give a 30 day supply-----it is a lot cheaper.

## 2014-05-21 NOTE — Telephone Encounter (Signed)
Encounter complete. 

## 2014-05-27 ENCOUNTER — Ambulatory Visit (INDEPENDENT_AMBULATORY_CARE_PROVIDER_SITE_OTHER): Payer: Medicare Other | Admitting: Pharmacist

## 2014-05-27 DIAGNOSIS — I480 Paroxysmal atrial fibrillation: Secondary | ICD-10-CM

## 2014-05-27 DIAGNOSIS — Z7901 Long term (current) use of anticoagulants: Secondary | ICD-10-CM

## 2014-05-27 DIAGNOSIS — I4891 Unspecified atrial fibrillation: Secondary | ICD-10-CM

## 2014-05-27 LAB — POCT INR: INR: 1.6

## 2014-06-10 ENCOUNTER — Ambulatory Visit (INDEPENDENT_AMBULATORY_CARE_PROVIDER_SITE_OTHER): Payer: Medicare Other | Admitting: Pharmacist Clinician (PhC)/ Clinical Pharmacy Specialist

## 2014-06-10 DIAGNOSIS — I4891 Unspecified atrial fibrillation: Secondary | ICD-10-CM

## 2014-06-10 DIAGNOSIS — I480 Paroxysmal atrial fibrillation: Secondary | ICD-10-CM

## 2014-06-10 DIAGNOSIS — Z7901 Long term (current) use of anticoagulants: Secondary | ICD-10-CM

## 2014-06-10 LAB — POCT INR: INR: 2.4

## 2014-06-12 ENCOUNTER — Ambulatory Visit
Admission: RE | Admit: 2014-06-12 | Discharge: 2014-06-12 | Disposition: A | Discharge status: Home / Self Care | Payer: Medicare Other | Source: Ambulatory Visit | Attending: General Surgery | Admitting: General Surgery

## 2014-06-12 DIAGNOSIS — Z9889 Other specified postprocedural states: Secondary | ICD-10-CM

## 2014-06-15 ENCOUNTER — Telehealth (INDEPENDENT_AMBULATORY_CARE_PROVIDER_SITE_OTHER): Payer: Self-pay | Admitting: *Deleted

## 2014-06-15 NOTE — Telephone Encounter (Signed)
Pt called regarding her surgery that is scheduled for 06-29-14.  She has some questions and concerns.  Her concern is that she is still having some mucus coming out of her rectum.  She states that she cannot tell if it's brown, pinkish, or a mixture.  She says that it has a bad odor.  She is concerned that she may have some type of infection.  Also, she wants to know about her PICC line being placed before surgery.  She states that it has not been set up yet.  She wants to know if she will see you for another appt before her surgery and if you will have routine labs done on her before surgery. I explained to pt I saw where your assistant contacted her back in June regarding the PICC, but the pt states that it has not been mentioned.  Please advise!  Anderson Malta

## 2014-06-17 ENCOUNTER — Encounter (HOSPITAL_COMMUNITY): Payer: Self-pay | Admitting: Pharmacy Technician

## 2014-06-17 NOTE — Telephone Encounter (Signed)
pls schedule pt for PICC line placement Friday 8/14 or first thing the morning of surgery on 8/17. Reason - poor peripheral IV access.  Ask when bad odor from rectum started? Was it after barium enema? Mucous drainage is expected.

## 2014-06-19 ENCOUNTER — Telehealth (INDEPENDENT_AMBULATORY_CARE_PROVIDER_SITE_OTHER): Payer: Self-pay

## 2014-06-19 ENCOUNTER — Other Ambulatory Visit: Payer: Self-pay | Admitting: Internal Medicine

## 2014-06-19 ENCOUNTER — Other Ambulatory Visit (INDEPENDENT_AMBULATORY_CARE_PROVIDER_SITE_OTHER): Payer: Self-pay | Admitting: General Surgery

## 2014-06-19 DIAGNOSIS — I998 Other disorder of circulatory system: Secondary | ICD-10-CM

## 2014-06-19 NOTE — Telephone Encounter (Signed)
Pt is scheduled for PICC line placement 06/26/14 at 11:00 a.m.  She needs to be at [facility] at that time to check in.

## 2014-06-22 ENCOUNTER — Other Ambulatory Visit: Payer: Medicare Other

## 2014-06-22 ENCOUNTER — Telehealth (INDEPENDENT_AMBULATORY_CARE_PROVIDER_SITE_OTHER): Payer: Self-pay

## 2014-06-22 NOTE — Telephone Encounter (Signed)
Pt is scheduled for surgery on 06/29/14 by Dr. [name].  She will need to stop her coumadin 5 days prior to that date.

## 2014-06-22 NOTE — Patient Instructions (Addendum)
[name]  06/22/2014                           YOUR PROCEDURE IS SCHEDULED ON: 06/29/14               ENTER THRU [place] MAIN HOSPITAL ENTRANCE AND                            FOLLOW  SIGNS TO SHORT STAY CENTER                 ARRIVE AT SHORT STAY AT: 7:45 AM               CALL THIS NUMBER IF ANY PROBLEMS THE DAY OF SURGERY :               [phone]                                REMEMBER:   Do not eat food or drink liquids AFTER MIDNIGHT                  Take these medicines the morning of surgery with               A SIPS OF WATER :   ISOSORBIDE / METOPROLOL / VALTREX              STOP ASPIRIN AND ALL HERBAL MEDS 5 DAYS PREOP     Do not wear jewelry, make-up   Do not wear lotions, powders, or perfumes.   Do not shave legs or underarms 12 hrs. before surgery (men may shave face)  Do not bring valuables to the hospital.  Contacts, dentures or bridgework may not be worn into surgery.  Leave suitcase in the car. After surgery it may be brought to your room.  For patients admitted to the hospital more than one night, checkout time is            11:00 AM                                                       The day of discharge.   Patients discharged the day of surgery will not be allowed to drive home.            If going home same day of surgery, must have someone stay with you              FIRST 24 hrs at home and arrange for some one to drive you              home from hospital.   ________________________________________________________________________                                                                        [place]  Before surgery, you can play an important role.  Because skin is not  sterile, your skin needs to be as free of germs as possible.  You can reduce the number of germs on your skin by washing with CHG (chlorahexidine gluconate) soap before surgery.  CHG is an antiseptic cleaner which kills germs  and bonds with the skin to continue killing germs even after washing. Please DO NOT use if you have an allergy to CHG or antibacterial soaps.  If your skin becomes reddened/irritated stop using the CHG and inform your nurse when you arrive at Short Stay. Do not shave (including legs and underarms) for at least 48 hours prior to the first CHG shower.  You may shave your face. Please follow these instructions carefully:   1.  Shower with CHG Soap the night before surgery and the  morning of Surgery.   2.  If you choose to wash your hair, wash your hair first as usual with your  normal  Shampoo.   3.  After you shampoo, rinse your hair and body thoroughly to remove the  shampoo.                                         4.  Use CHG as you would any other liquid soap.  You can apply chg directly  to the skin and wash . Gently wash with scrungie or clean wascloth    5.  Apply the CHG Soap to your body ONLY FROM THE NECK DOWN.   Do not use on open                           Wound or open sores. Avoid contact with eyes, ears mouth and genitals (private parts).                        Genitals (private parts) with your normal soap.              6.  Wash thoroughly, paying special attention to the area where your surgery  will be performed.   7.  Thoroughly rinse your body with warm water from the neck down.   8.  DO NOT shower/wash with your normal soap after using and rinsing off  the CHG Soap .                9.  Pat yourself dry with a clean towel.             10.  Wear clean pajamas.             11.  Place clean sheets on your bed the night of your first shower and do not  sleep with pets.  Day of Surgery : Do not apply any lotions/deodorants the morning of surgery.  Please wear clean clothes to the hospital/surgery center.  FAILURE TO FOLLOW THESE INSTRUCTIONS MAY RESULT IN THE CANCELLATION OF YOUR SURGERY    PATIENT  SIGNATURE_________________________________  ______________________________________________________________________    WHAT IS A BLOOD TRANSFUSION? Blood Transfusion Information  A transfusion is the replacement of blood or some of its parts. Blood is made up of multiple cells which provide different functions.  Red blood cells carry oxygen and are used for blood loss replacement.  White blood cells fight against infection.  Platelets control bleeding.  Plasma helps clot blood.  Other blood products are available for specialized needs, such  as hemophilia or other clotting disorders. BEFORE THE TRANSFUSION  Who gives blood for transfusions?   Healthy volunteers who are fully evaluated to make sure their blood is safe. This is blood bank blood. Transfusion therapy is the safest it has ever been in the practice of medicine. Before blood is taken from a donor, a complete history is taken to make sure that person has no history of diseases nor engages in risky social behavior (examples are intravenous drug use or sexual activity with multiple partners). The donor's travel history is screened to minimize risk of transmitting infections, such as malaria. The donated blood is tested for signs of infectious diseases, such as HIV and hepatitis. The blood is then tested to be sure it is compatible with you in order to minimize the chance of a transfusion reaction. If you or a relative donates blood, this is often done in anticipation of surgery and is not appropriate for emergency situations. It takes many days to process the donated blood. RISKS AND COMPLICATIONS Although transfusion therapy is very safe and saves many lives, the main dangers of transfusion include:   Getting an infectious disease.  Developing a transfusion reaction. This is an allergic reaction to something in the blood you were given. Every precaution is taken to prevent this. The decision to have a blood transfusion has been  considered carefully by your caregiver before blood is given. Blood is not given unless the benefits outweigh the risks. AFTER THE TRANSFUSION  Right after receiving a blood transfusion, you will usually feel much better and more energetic. This is especially true if your red blood cells have gotten low (anemic). The transfusion raises the level of the red blood cells which carry oxygen, and this usually causes an energy increase.  The nurse administering the transfusion will monitor you carefully for complications. HOME CARE INSTRUCTIONS  No special instructions are needed after a transfusion. You may find your energy is better. Speak with your caregiver about any limitations on activity for underlying diseases you may have. SEEK MEDICAL CARE IF:   Your condition is not improving after your transfusion.  You develop redness or irritation at the intravenous (IV) site. SEEK IMMEDIATE MEDICAL CARE IF:  Any of the following symptoms occur over the next 12 hours:  Shaking chills.  You have a temperature by mouth above 102 F (38.9 C), not controlled by medicine.  Chest, back, or muscle pain.  People around you feel you are not acting correctly or are confused.  Shortness of breath or difficulty breathing.  Dizziness and fainting.  You get a rash or develop hives.  You have a decrease in urine output.  Your urine turns a dark color or changes to pink, red, or brown. Any of the following symptoms occur over the next 10 days:  You have a temperature by mouth above 102 F (38.9 C), not controlled by medicine.  Shortness of breath.  Weakness after normal activity.  The white part of the eye turns yellow (jaundice).  You have a decrease in the amount of urine or are urinating less often.  Your urine turns a dark color or changes to pink, red, or brown. Document Released: 10/27/2000 Document Revised: 01/22/2012 Document Reviewed: 06/15/2008 [facility] Patient Information 2014  [place], [place].  _______________________________________________________________________

## 2014-06-23 ENCOUNTER — Telehealth (INDEPENDENT_AMBULATORY_CARE_PROVIDER_SITE_OTHER): Payer: Self-pay | Admitting: General Surgery

## 2014-06-23 ENCOUNTER — Encounter (HOSPITAL_COMMUNITY)
Admission: RE | Admit: 2014-06-23 | Discharge: 2014-06-23 | Disposition: A | Discharge status: Home / Self Care | Payer: Medicare Other | Source: Ambulatory Visit | Attending: General Surgery | Admitting: General Surgery

## 2014-06-23 ENCOUNTER — Encounter (HOSPITAL_COMMUNITY): Payer: Self-pay

## 2014-06-23 ENCOUNTER — Telehealth (INDEPENDENT_AMBULATORY_CARE_PROVIDER_SITE_OTHER): Payer: Self-pay

## 2014-06-23 DIAGNOSIS — Z01812 Encounter for preprocedural laboratory examination: Secondary | ICD-10-CM | POA: Insufficient documentation

## 2014-06-23 DIAGNOSIS — K5732 Diverticulitis of large intestine without perforation or abscess without bleeding: Secondary | ICD-10-CM | POA: Diagnosis not present

## 2014-06-23 DIAGNOSIS — Z01818 Encounter for other preprocedural examination: Secondary | ICD-10-CM | POA: Diagnosis not present

## 2014-06-23 HISTORY — DX: Colostomy status: Z93.3

## 2014-06-23 HISTORY — DX: Diverticulitis of intestine, part unspecified, with perforation and abscess without bleeding: K57.80

## 2014-06-23 HISTORY — DX: Other cervical disc degeneration, unspecified cervical region: M50.30

## 2014-06-23 HISTORY — DX: Other specified disorders of bone density and structure, unspecified site: M85.80

## 2014-06-23 LAB — COMPREHENSIVE METABOLIC PANEL
ALT: 23 U/L (ref 0–35)
AST: 27 U/L (ref 0–37)
Albumin: 3.8 g/dL (ref 3.5–5.2)
Alkaline Phosphatase: 67 U/L (ref 39–117)
Anion gap: 12 (ref 5–15)
BUN: 16 mg/dL (ref 6–23)
CO2: 27 mEq/L (ref 19–32)
Calcium: 9.6 mg/dL (ref 8.4–10.5)
Chloride: 100 mEq/L (ref 96–112)
Creatinine, Ser: 0.63 mg/dL (ref 0.50–1.10)
GFR calc Af Amer: >90 mL/min (ref >90)
GFR calc non Af Amer: 89 mL/min — ABNORMAL LOW (ref >90)
Glucose, Bld: 97 mg/dL (ref 70–99)
Potassium: 5.3 mEq/L (ref 3.7–5.3)
Sodium: 139 mEq/L (ref 137–147)
Total Bilirubin: 0.2 mg/dL — ABNORMAL LOW (ref 0.3–1.2)
Total Protein: 7 g/dL (ref 6.0–8.3)

## 2014-06-23 LAB — CBC WITH DIFFERENTIAL/PLATELET
Basophils Absolute: 0.1 10*3/uL (ref 0.0–0.1)
Basophils Relative: 1 % (ref 0–1)
Eosinophils Absolute: 0.1 10*3/uL (ref 0.0–0.7)
Eosinophils Relative: 1 % (ref 0–5)
HCT: 41.3 % (ref 36.0–46.0)
Hemoglobin: 13.6 g/dL (ref 12.0–15.0)
Lymphocytes Relative: 36 % (ref 12–46)
Lymphs Abs: 3.2 10*3/uL (ref 0.7–4.0)
MCH: 30 pg (ref 26.0–34.0)
MCHC: 32.9 g/dL (ref 30.0–36.0)
MCV: 91.2 fL (ref 78.0–100.0)
Monocytes Absolute: 0.6 10*3/uL (ref 0.1–1.0)
Monocytes Relative: 6 % (ref 3–12)
Neutro Abs: 5.1 10*3/uL (ref 1.7–7.7)
Neutrophils Relative %: 56 % (ref 43–77)
Platelets: 250 10*3/uL (ref 150–400)
RBC: 4.53 MIL/uL (ref 3.87–5.11)
RDW: 14.9 % (ref 11.5–15.5)
WBC: 8.9 10*3/uL (ref 4.0–10.5)

## 2014-06-23 LAB — APTT: aPTT: 43 seconds — ABNORMAL HIGH (ref 24–37)

## 2014-06-23 LAB — PROTIME-INR
INR: 2.36 — ABNORMAL HIGH (ref 0.00–1.49)
Prothrombin Time: 25.8 seconds — ABNORMAL HIGH (ref 11.6–15.2)

## 2014-06-23 LAB — ABO/RH: ABO/RH(D): O POS

## 2014-06-23 NOTE — Telephone Encounter (Signed)
Called pt to see if she had anyquestions about surgery next week since i'm out of office starting tomorrow. LM on voicemail. Advised her to call office if she had any questions.

## 2014-06-23 NOTE — Telephone Encounter (Signed)
Pt scheduled for surgery.  She was seen by pre-op today.  Will the patient need to hold her vasodilator prior to surgery?

## 2014-06-23 NOTE — Telephone Encounter (Signed)
Pt returned my phone call. Ask some generic questions about diverticulitis etiology. Asked if she should take her pletal. Told her she could take that but not on day of surgery. Pt unaware of bowel prep and clears liquids diet for this Sunday. Advised her to do clear liquids on Sunday and bowel prep which would include several oral abx. Advised her i would have my office contact her with bowel prep instructions and oral abx Rx.

## 2014-06-23 NOTE — Telephone Encounter (Signed)
LM to cancel pt's PICC placement 06/26/14 at 11:00 a.m.  Pt has declined.

## 2014-06-24 ENCOUNTER — Telehealth (INDEPENDENT_AMBULATORY_CARE_PROVIDER_SITE_OTHER): Payer: Self-pay

## 2014-06-24 ENCOUNTER — Encounter (HOSPITAL_COMMUNITY): Payer: Self-pay

## 2014-06-24 ENCOUNTER — Telehealth (INDEPENDENT_AMBULATORY_CARE_PROVIDER_SITE_OTHER): Payer: Self-pay | Admitting: General Surgery

## 2014-06-24 NOTE — Telephone Encounter (Signed)
Pt states she did not receive Rx for preop abx or instructions for surgery.  Please contact her.  Her email is [email or web address].

## 2014-06-24 NOTE — Telephone Encounter (Signed)
Pt contacted and instructions for bowel prep and antibiotics were reviewed over the phone.  She was e-mailed a hard copy.  Pre-op antibiotics called in to [facility] [place], [address] [place], [place].

## 2014-06-24 NOTE — Telephone Encounter (Signed)
Pt will need to p/u (or we can mail) instructions for the one day bowel prep and Rx for antibiotics.  Please let Jearld Fenton know what pt would like to do.

## 2014-06-25 ENCOUNTER — Telehealth (INDEPENDENT_AMBULATORY_CARE_PROVIDER_SITE_OTHER): Payer: Self-pay

## 2014-06-25 NOTE — Telephone Encounter (Signed)
Drug interaction between Simvastatin and Erythromycin.  Please instruct the patient to not take her Simvastatin the day before her surgery.

## 2014-06-25 NOTE — Telephone Encounter (Signed)
Rx for Neomycin and Erythromycin pre op medications called to [facility] in [place].  Pharmacy only had medications in 500 mg tablets.  Pt will need to take two (2) tablets as directed.

## 2014-06-26 ENCOUNTER — Other Ambulatory Visit (HOSPITAL_COMMUNITY): Payer: Medicare Other

## 2014-06-26 ENCOUNTER — Telehealth: Payer: Self-pay | Admitting: Pharmacist Clinician (PhC)/ Clinical Pharmacy Specialist

## 2014-06-26 NOTE — Telephone Encounter (Signed)
Message sent to [facility]

## 2014-06-26 NOTE — Telephone Encounter (Signed)
Wanted to speak with [name] regarding her Coumadin.  Patient is scheduled for surgery on Monday of next week and wants to know if we have sent her pre op clearance to the surgeon.

## 2014-06-26 NOTE — Telephone Encounter (Signed)
Left message that patient is cleared and ready for surgery.  She has received all of her instructions

## 2014-06-26 NOTE — Telephone Encounter (Signed)
Pt will stop taking her Simvastatin two days prior to her surgery and two days after, per advice from her pharmacist.  Pt will be in the hospital so will need Dr. [name] to be aware of this.

## 2014-06-26 NOTE — Telephone Encounter (Signed)
Chart shows clearance sent to [facility] for August colon surgery.

## 2014-06-28 ENCOUNTER — Inpatient Hospital Stay (HOSPITAL_COMMUNITY)
Admission: EM | Admit: 2014-06-28 | Discharge: 2014-07-06 | DRG: 330 | Disposition: A | Discharge status: Home Health | Payer: Medicare Other | Attending: General Surgery | Admitting: General Surgery

## 2014-06-28 ENCOUNTER — Inpatient Hospital Stay (HOSPITAL_COMMUNITY)
Admission: AD | Admit: 2014-06-28 | Payer: Self-pay | Source: Other Acute Inpatient Hospital | Admitting: General Surgery

## 2014-06-28 ENCOUNTER — Emergency Department: Payer: Self-pay | Admitting: Emergency Medicine

## 2014-06-28 ENCOUNTER — Encounter (HOSPITAL_COMMUNITY): Payer: Self-pay | Admitting: Emergency Medicine

## 2014-06-28 DIAGNOSIS — Z433 Encounter for attention to colostomy: Secondary | ICD-10-CM | POA: Diagnosis not present

## 2014-06-28 DIAGNOSIS — Z881 Allergy status to other antibiotic agents status: Secondary | ICD-10-CM

## 2014-06-28 DIAGNOSIS — Z980 Intestinal bypass and anastomosis status: Secondary | ICD-10-CM

## 2014-06-28 DIAGNOSIS — I272 Pulmonary hypertension, unspecified: Secondary | ICD-10-CM | POA: Diagnosis present

## 2014-06-28 DIAGNOSIS — M51379 Other intervertebral disc degeneration, lumbosacral region without mention of lumbar back pain or lower extremity pain: Secondary | ICD-10-CM | POA: Diagnosis present

## 2014-06-28 DIAGNOSIS — I739 Peripheral vascular disease, unspecified: Secondary | ICD-10-CM | POA: Diagnosis present

## 2014-06-28 DIAGNOSIS — D62 Acute posthemorrhagic anemia: Secondary | ICD-10-CM | POA: Diagnosis not present

## 2014-06-28 DIAGNOSIS — Z91040 Latex allergy status: Secondary | ICD-10-CM | POA: Diagnosis not present

## 2014-06-28 DIAGNOSIS — M549 Dorsalgia, unspecified: Secondary | ICD-10-CM | POA: Diagnosis present

## 2014-06-28 DIAGNOSIS — Z7901 Long term (current) use of anticoagulants: Secondary | ICD-10-CM

## 2014-06-28 DIAGNOSIS — K5720 Diverticulitis of large intestine with perforation and abscess without bleeding: Secondary | ICD-10-CM | POA: Diagnosis present

## 2014-06-28 DIAGNOSIS — M3500 Sicca syndrome, unspecified: Secondary | ICD-10-CM | POA: Diagnosis present

## 2014-06-28 DIAGNOSIS — I25118 Atherosclerotic heart disease of native coronary artery with other forms of angina pectoris: Secondary | ICD-10-CM | POA: Diagnosis present

## 2014-06-28 DIAGNOSIS — Z79899 Other long term (current) drug therapy: Secondary | ICD-10-CM | POA: Diagnosis not present

## 2014-06-28 DIAGNOSIS — M949 Disorder of cartilage, unspecified: Secondary | ICD-10-CM | POA: Diagnosis present

## 2014-06-28 DIAGNOSIS — R0789 Other chest pain: Secondary | ICD-10-CM

## 2014-06-28 DIAGNOSIS — K5732 Diverticulitis of large intestine without perforation or abscess without bleeding: Secondary | ICD-10-CM | POA: Diagnosis not present

## 2014-06-28 DIAGNOSIS — Z888 Allergy status to other drugs, medicaments and biological substances status: Secondary | ICD-10-CM

## 2014-06-28 DIAGNOSIS — I10 Essential (primary) hypertension: Secondary | ICD-10-CM | POA: Diagnosis present

## 2014-06-28 DIAGNOSIS — M899 Disorder of bone, unspecified: Secondary | ICD-10-CM | POA: Diagnosis present

## 2014-06-28 DIAGNOSIS — I2510 Atherosclerotic heart disease of native coronary artery without angina pectoris: Secondary | ICD-10-CM | POA: Diagnosis present

## 2014-06-28 DIAGNOSIS — Z9049 Acquired absence of other specified parts of digestive tract: Secondary | ICD-10-CM | POA: Diagnosis not present

## 2014-06-28 DIAGNOSIS — G894 Chronic pain syndrome: Secondary | ICD-10-CM | POA: Diagnosis present

## 2014-06-28 DIAGNOSIS — L259 Unspecified contact dermatitis, unspecified cause: Secondary | ICD-10-CM | POA: Diagnosis present

## 2014-06-28 DIAGNOSIS — Z85828 Personal history of other malignant neoplasm of skin: Secondary | ICD-10-CM

## 2014-06-28 DIAGNOSIS — Z7982 Long term (current) use of aspirin: Secondary | ICD-10-CM

## 2014-06-28 DIAGNOSIS — I252 Old myocardial infarction: Secondary | ICD-10-CM | POA: Diagnosis not present

## 2014-06-28 DIAGNOSIS — Z87891 Personal history of nicotine dependence: Secondary | ICD-10-CM | POA: Diagnosis not present

## 2014-06-28 DIAGNOSIS — Z5331 Laparoscopic surgical procedure converted to open procedure: Secondary | ICD-10-CM

## 2014-06-28 DIAGNOSIS — F419 Anxiety disorder, unspecified: Secondary | ICD-10-CM | POA: Diagnosis present

## 2014-06-28 DIAGNOSIS — I480 Paroxysmal atrial fibrillation: Secondary | ICD-10-CM | POA: Diagnosis present

## 2014-06-28 DIAGNOSIS — Z933 Colostomy status: Secondary | ICD-10-CM

## 2014-06-28 DIAGNOSIS — E7800 Pure hypercholesterolemia, unspecified: Secondary | ICD-10-CM | POA: Diagnosis present

## 2014-06-28 DIAGNOSIS — Z807 Family history of other malignant neoplasms of lymphoid, hematopoietic and related tissues: Secondary | ICD-10-CM | POA: Diagnosis not present

## 2014-06-28 DIAGNOSIS — F411 Generalized anxiety disorder: Secondary | ICD-10-CM | POA: Diagnosis present

## 2014-06-28 DIAGNOSIS — T363X5A Adverse effect of macrolides, initial encounter: Secondary | ICD-10-CM | POA: Diagnosis present

## 2014-06-28 DIAGNOSIS — Z880 Allergy status to penicillin: Secondary | ICD-10-CM

## 2014-06-28 DIAGNOSIS — I2789 Other specified pulmonary heart diseases: Secondary | ICD-10-CM | POA: Diagnosis present

## 2014-06-28 DIAGNOSIS — M5137 Other intervertebral disc degeneration, lumbosacral region: Secondary | ICD-10-CM | POA: Diagnosis present

## 2014-06-28 DIAGNOSIS — Z951 Presence of aortocoronary bypass graft: Secondary | ICD-10-CM

## 2014-06-28 DIAGNOSIS — R112 Nausea with vomiting, unspecified: Secondary | ICD-10-CM | POA: Diagnosis not present

## 2014-06-28 DIAGNOSIS — I4891 Unspecified atrial fibrillation: Secondary | ICD-10-CM | POA: Diagnosis present

## 2014-06-28 DIAGNOSIS — K5790 Diverticulosis of intestine, part unspecified, without perforation or abscess without bleeding: Secondary | ICD-10-CM | POA: Diagnosis present

## 2014-06-28 DIAGNOSIS — K660 Peritoneal adhesions (postprocedural) (postinfection): Secondary | ICD-10-CM | POA: Diagnosis present

## 2014-06-28 DIAGNOSIS — Z01812 Encounter for preprocedural laboratory examination: Secondary | ICD-10-CM

## 2014-06-28 DIAGNOSIS — L309 Dermatitis, unspecified: Secondary | ICD-10-CM | POA: Diagnosis present

## 2014-06-28 DIAGNOSIS — Z8249 Family history of ischemic heart disease and other diseases of the circulatory system: Secondary | ICD-10-CM | POA: Diagnosis not present

## 2014-06-28 HISTORY — DX: Dermatitis, unspecified: L30.9

## 2014-06-28 LAB — CBC WITH DIFFERENTIAL/PLATELET
Basophil #: 0.1 10*3/uL (ref 0.0–0.1)
Basophil %: 0.9 %
Eosinophil #: 0 10*3/uL (ref 0.0–0.7)
Eosinophil %: 0.4 %
HCT: 42.6 % (ref 35.0–47.0)
HGB: 13.9 g/dL (ref 12.0–16.0)
Lymphocyte #: 2.6 10*3/uL (ref 1.0–3.6)
Lymphocyte %: 20.7 %
MCH: 29.8 pg (ref 26.0–34.0)
MCHC: 32.7 g/dL (ref 32.0–36.0)
MCV: 91 fL (ref 80–100)
Monocyte #: 0.9 x10 3/mm (ref 0.2–0.9)
Monocyte %: 6.9 %
Neutrophil #: 8.9 10*3/uL — ABNORMAL HIGH (ref 1.4–6.5)
Neutrophil %: 71.1 %
Platelet: 280 10*3/uL (ref 150–440)
RBC: 4.69 10*6/uL (ref 3.80–5.20)
RDW: 15 % — ABNORMAL HIGH (ref 11.5–14.5)
WBC: 12.5 10*3/uL — ABNORMAL HIGH (ref 3.6–11.0)

## 2014-06-28 LAB — MAGNESIUM: Magnesium: 1.7 mg/dL — ABNORMAL LOW

## 2014-06-28 LAB — COMPREHENSIVE METABOLIC PANEL
Albumin: 3.9 g/dL (ref 3.4–5.0)
Alkaline Phosphatase: 72 U/L
Anion Gap: 9 (ref 7–16)
BUN: 11 mg/dL (ref 7–18)
Bilirubin,Total: 0.6 mg/dL (ref 0.2–1.0)
Calcium, Total: 8.8 mg/dL (ref 8.5–10.1)
Chloride: 104 mmol/L (ref 98–107)
Co2: 21 mmol/L (ref 21–32)
Creatinine: 0.54 mg/dL — ABNORMAL LOW (ref 0.60–1.30)
EGFR (African American): >60
EGFR (Non-African Amer.): >60
Glucose: 108 mg/dL — ABNORMAL HIGH (ref 65–99)
Osmolality: 268 (ref 275–301)
Potassium: 4.2 mmol/L (ref 3.5–5.1)
SGOT(AST): 33 U/L (ref 15–37)
SGPT (ALT): 30 U/L
Sodium: 134 mmol/L — ABNORMAL LOW (ref 136–145)
Total Protein: 7.6 g/dL (ref 6.4–8.2)

## 2014-06-28 LAB — LIPASE, BLOOD: Lipase: 192 U/L (ref 73–393)

## 2014-06-28 LAB — TROPONIN I: Troponin-I: <0.02 ng/mL

## 2014-06-28 MED ORDER — GENTAMICIN SULFATE 40 MG/ML IJ SOLN
5.0000 mg/kg | INTRAVENOUS | Status: DC
  Filled 2014-06-28: qty 7.5

## 2014-06-28 MED ORDER — GENTAMICIN SULFATE 40 MG/ML IJ SOLN
5.0000 mg/kg | INTRAVENOUS | Status: DC
  Filled 2014-06-28: qty 8

## 2014-06-28 MED ORDER — CLINDAMYCIN PHOSPHATE 900 MG/50ML IV SOLN: 900.0000 mg | INTRAVENOUS | Status: DC

## 2014-06-28 NOTE — ED Notes (Signed)
Patient was at [facility] and started feeling chest pain. Patient was given one of nitro and 2 mg of morphine. Patient is now at 0 pain as long as she is still but when she takes a deep breathe or move it is at a 4.

## 2014-06-28 NOTE — ED Notes (Signed)
Bed: WA20 Expected date:  Expected time:  Means of arrival:  Comments: PT from [facility]

## 2014-06-29 ENCOUNTER — Encounter (HOSPITAL_COMMUNITY)
Admission: EM | Disposition: A | Discharge status: Home Health | Payer: Self-pay | Source: Home / Self Care | Attending: General Surgery

## 2014-06-29 ENCOUNTER — Encounter (HOSPITAL_COMMUNITY): Payer: Self-pay | Admitting: General Practice

## 2014-06-29 ENCOUNTER — Inpatient Hospital Stay (HOSPITAL_COMMUNITY): Payer: Medicare Other | Admitting: Anesthesiology

## 2014-06-29 ENCOUNTER — Inpatient Hospital Stay (HOSPITAL_COMMUNITY): Admission: RE | Admit: 2014-06-29 | Payer: Medicare Other | Source: Ambulatory Visit | Admitting: General Surgery

## 2014-06-29 ENCOUNTER — Encounter (HOSPITAL_COMMUNITY): Payer: Medicare Other | Admitting: Anesthesiology

## 2014-06-29 DIAGNOSIS — Z5331 Laparoscopic surgical procedure converted to open procedure: Secondary | ICD-10-CM

## 2014-06-29 DIAGNOSIS — R112 Nausea with vomiting, unspecified: Secondary | ICD-10-CM | POA: Diagnosis present

## 2014-06-29 DIAGNOSIS — M3500 Sicca syndrome, unspecified: Secondary | ICD-10-CM | POA: Diagnosis present

## 2014-06-29 DIAGNOSIS — M899 Disorder of bone, unspecified: Secondary | ICD-10-CM | POA: Diagnosis present

## 2014-06-29 DIAGNOSIS — Z87891 Personal history of nicotine dependence: Secondary | ICD-10-CM | POA: Diagnosis not present

## 2014-06-29 DIAGNOSIS — M949 Disorder of cartilage, unspecified: Secondary | ICD-10-CM | POA: Diagnosis present

## 2014-06-29 DIAGNOSIS — I2510 Atherosclerotic heart disease of native coronary artery without angina pectoris: Secondary | ICD-10-CM | POA: Diagnosis present

## 2014-06-29 DIAGNOSIS — I252 Old myocardial infarction: Secondary | ICD-10-CM | POA: Diagnosis not present

## 2014-06-29 DIAGNOSIS — I4891 Unspecified atrial fibrillation: Secondary | ICD-10-CM | POA: Diagnosis present

## 2014-06-29 DIAGNOSIS — L259 Unspecified contact dermatitis, unspecified cause: Secondary | ICD-10-CM | POA: Diagnosis present

## 2014-06-29 DIAGNOSIS — K660 Peritoneal adhesions (postprocedural) (postinfection): Secondary | ICD-10-CM

## 2014-06-29 DIAGNOSIS — Z91040 Latex allergy status: Secondary | ICD-10-CM | POA: Diagnosis not present

## 2014-06-29 DIAGNOSIS — G894 Chronic pain syndrome: Secondary | ICD-10-CM | POA: Diagnosis present

## 2014-06-29 DIAGNOSIS — I2789 Other specified pulmonary heart diseases: Secondary | ICD-10-CM | POA: Diagnosis present

## 2014-06-29 DIAGNOSIS — M549 Dorsalgia, unspecified: Secondary | ICD-10-CM | POA: Diagnosis present

## 2014-06-29 DIAGNOSIS — Z9049 Acquired absence of other specified parts of digestive tract: Secondary | ICD-10-CM | POA: Diagnosis not present

## 2014-06-29 DIAGNOSIS — Z807 Family history of other malignant neoplasms of lymphoid, hematopoietic and related tissues: Secondary | ICD-10-CM | POA: Diagnosis not present

## 2014-06-29 DIAGNOSIS — F411 Generalized anxiety disorder: Secondary | ICD-10-CM | POA: Diagnosis present

## 2014-06-29 DIAGNOSIS — Z951 Presence of aortocoronary bypass graft: Secondary | ICD-10-CM | POA: Diagnosis not present

## 2014-06-29 DIAGNOSIS — Z79899 Other long term (current) drug therapy: Secondary | ICD-10-CM | POA: Diagnosis not present

## 2014-06-29 DIAGNOSIS — M5137 Other intervertebral disc degeneration, lumbosacral region: Secondary | ICD-10-CM | POA: Diagnosis present

## 2014-06-29 DIAGNOSIS — Z01812 Encounter for preprocedural laboratory examination: Secondary | ICD-10-CM | POA: Diagnosis not present

## 2014-06-29 DIAGNOSIS — Z933 Colostomy status: Secondary | ICD-10-CM

## 2014-06-29 DIAGNOSIS — Z880 Allergy status to penicillin: Secondary | ICD-10-CM | POA: Diagnosis not present

## 2014-06-29 DIAGNOSIS — Z7901 Long term (current) use of anticoagulants: Secondary | ICD-10-CM | POA: Diagnosis not present

## 2014-06-29 DIAGNOSIS — Z433 Encounter for attention to colostomy: Secondary | ICD-10-CM

## 2014-06-29 DIAGNOSIS — E7800 Pure hypercholesterolemia, unspecified: Secondary | ICD-10-CM | POA: Diagnosis present

## 2014-06-29 DIAGNOSIS — Z85828 Personal history of other malignant neoplasm of skin: Secondary | ICD-10-CM | POA: Diagnosis not present

## 2014-06-29 DIAGNOSIS — Z980 Intestinal bypass and anastomosis status: Secondary | ICD-10-CM | POA: Diagnosis not present

## 2014-06-29 DIAGNOSIS — K5732 Diverticulitis of large intestine without perforation or abscess without bleeding: Secondary | ICD-10-CM | POA: Diagnosis present

## 2014-06-29 DIAGNOSIS — T363X5A Adverse effect of macrolides, initial encounter: Secondary | ICD-10-CM | POA: Diagnosis present

## 2014-06-29 DIAGNOSIS — I739 Peripheral vascular disease, unspecified: Secondary | ICD-10-CM | POA: Diagnosis present

## 2014-06-29 DIAGNOSIS — Z881 Allergy status to other antibiotic agents status: Secondary | ICD-10-CM | POA: Diagnosis not present

## 2014-06-29 DIAGNOSIS — I10 Essential (primary) hypertension: Secondary | ICD-10-CM | POA: Diagnosis present

## 2014-06-29 DIAGNOSIS — D62 Acute posthemorrhagic anemia: Secondary | ICD-10-CM | POA: Diagnosis not present

## 2014-06-29 DIAGNOSIS — Z7982 Long term (current) use of aspirin: Secondary | ICD-10-CM | POA: Diagnosis not present

## 2014-06-29 DIAGNOSIS — Z888 Allergy status to other drugs, medicaments and biological substances status: Secondary | ICD-10-CM | POA: Diagnosis not present

## 2014-06-29 DIAGNOSIS — Z8249 Family history of ischemic heart disease and other diseases of the circulatory system: Secondary | ICD-10-CM | POA: Diagnosis not present

## 2014-06-29 HISTORY — PX: COLOSTOMY TAKEDOWN: SHX5258

## 2014-06-29 LAB — I-STAT TROPONIN, ED: Troponin i, poc: 0 ng/mL (ref 0.00–0.08)

## 2014-06-29 LAB — PROTIME-INR
INR: 1.19 (ref 0.00–1.49)
Prothrombin Time: 15.1 seconds (ref 11.6–15.2)

## 2014-06-29 LAB — TYPE AND SCREEN
ABO/RH(D): O POS
Antibody Screen: NEGATIVE

## 2014-06-29 LAB — SURGICAL PCR SCREEN
MRSA, PCR: NEGATIVE
Staphylococcus aureus: NEGATIVE

## 2014-06-29 SURGERY — CLOSURE, COLOSTOMY, LAPAROSCOPIC
Anesthesia: General | Site: Abdomen

## 2014-06-29 MED ORDER — KCL IN DEXTROSE-NACL 20-5-0.45 MEQ/L-%-% IV SOLN
INTRAVENOUS | Status: DC
  Administered 2014-06-29 – 2014-06-30 (×2): via INTRAVENOUS
  Filled 2014-06-29 (×4): qty 1000

## 2014-06-29 MED ORDER — LACTATED RINGERS IV SOLN: INTRAVENOUS | Status: DC

## 2014-06-29 MED ORDER — GENTAMICIN SULFATE 40 MG/ML IJ SOLN
300.0000 mg | Freq: Once | INTRAVENOUS | Status: DC
  Filled 2014-06-29: qty 7.5

## 2014-06-29 MED ORDER — HYDROMORPHONE HCL PF 2 MG/ML IJ SOLN
INTRAMUSCULAR | Status: AC
  Filled 2014-06-29: qty 1

## 2014-06-29 MED ORDER — ALVIMOPAN 12 MG PO CAPS
12.0000 mg | ORAL_CAPSULE | Freq: Two times a day (BID) | ORAL | Status: DC
  Administered 2014-06-30 – 2014-07-03 (×8): 12 mg via ORAL
  Filled 2014-06-29 (×10): qty 1

## 2014-06-29 MED ORDER — CLINDAMYCIN PHOSPHATE 900 MG/50ML IV SOLN
900.0000 mg | Freq: Once | INTRAVENOUS | Status: AC
  Administered 2014-06-29: 900 mg via INTRAVENOUS

## 2014-06-29 MED ORDER — SODIUM CHLORIDE 0.9 % IJ SOLN: 9.0000 mL | INTRAMUSCULAR | Status: DC | PRN

## 2014-06-29 MED ORDER — ONDANSETRON HCL 4 MG/2ML IJ SOLN: 4.0000 mg | Freq: Four times a day (QID) | INTRAMUSCULAR | Status: DC | PRN

## 2014-06-29 MED ORDER — NALOXONE HCL 0.4 MG/ML IJ SOLN: 0.4000 mg | INTRAMUSCULAR | Status: DC | PRN

## 2014-06-29 MED ORDER — GLYCOPYRROLATE 0.2 MG/ML IJ SOLN
INTRAMUSCULAR | Status: AC
  Filled 2014-06-29: qty 2

## 2014-06-29 MED ORDER — NEOSTIGMINE METHYLSULFATE 10 MG/10ML IV SOLN
INTRAVENOUS | Status: DC | PRN
  Administered 2014-06-29: 3 mg via INTRAVENOUS

## 2014-06-29 MED ORDER — HYDROMORPHONE HCL PF 1 MG/ML IJ SOLN: 0.2500 mg | INTRAMUSCULAR | Status: DC | PRN

## 2014-06-29 MED ORDER — ONDANSETRON HCL 4 MG/2ML IJ SOLN
4.0000 mg | Freq: Four times a day (QID) | INTRAMUSCULAR | Status: DC | PRN
  Administered 2014-07-06: 4 mg via INTRAVENOUS
  Filled 2014-06-29: qty 2

## 2014-06-29 MED ORDER — MORPHINE SULFATE 2 MG/ML IJ SOLN: 1.0000 mg | INTRAMUSCULAR | Status: DC | PRN

## 2014-06-29 MED ORDER — PROPOFOL 10 MG/ML IV BOLUS
INTRAVENOUS | Status: DC | PRN
  Administered 2014-06-29: 150 mg via INTRAVENOUS

## 2014-06-29 MED ORDER — LACTATED RINGERS IV SOLN
INTRAVENOUS | Status: DC | PRN
  Administered 2014-06-29 (×4): via INTRAVENOUS

## 2014-06-29 MED ORDER — 0.9 % SODIUM CHLORIDE (POUR BTL) OPTIME
TOPICAL | Status: DC | PRN
  Administered 2014-06-29: 5000 mL

## 2014-06-29 MED ORDER — LACTATED RINGERS IV SOLN
INTRAVENOUS | Status: DC | PRN
  Administered 2014-06-29: 09:00:00 via INTRAVENOUS

## 2014-06-29 MED ORDER — PROPOFOL 10 MG/ML IV BOLUS
INTRAVENOUS | Status: AC
  Filled 2014-06-29: qty 20

## 2014-06-29 MED ORDER — PANTOPRAZOLE SODIUM 40 MG IV SOLR
40.0000 mg | Freq: Every day | INTRAVENOUS | Status: DC
  Administered 2014-06-29 – 2014-07-04 (×6): 40 mg via INTRAVENOUS
  Filled 2014-06-29 (×8): qty 40

## 2014-06-29 MED ORDER — ACETAMINOPHEN 325 MG PO TABS
650.0000 mg | ORAL_TABLET | Freq: Once | ORAL | Status: AC
  Administered 2014-06-29: 650 mg via ORAL
  Filled 2014-06-29: qty 2

## 2014-06-29 MED ORDER — DEXAMETHASONE SODIUM PHOSPHATE 10 MG/ML IJ SOLN
INTRAMUSCULAR | Status: DC | PRN
  Administered 2014-06-29: 10 mg via INTRAVENOUS

## 2014-06-29 MED ORDER — MORPHINE SULFATE (PF) 1 MG/ML IV SOLN
INTRAVENOUS | Status: DC
  Administered 2014-06-29: 16:00:00 via INTRAVENOUS
  Administered 2014-06-29: 7.5 mg via INTRAVENOUS
  Administered 2014-06-30: 5.18 mg via INTRAVENOUS
  Administered 2014-06-30: 18 mg via INTRAVENOUS
  Administered 2014-06-30: 4.5 mg via INTRAVENOUS
  Administered 2014-06-30: 7.5 mg via INTRAVENOUS
  Administered 2014-06-30 (×2): via INTRAVENOUS
  Administered 2014-06-30: 11.7 mg via INTRAVENOUS
  Administered 2014-06-30: 1.5 mg via INTRAVENOUS
  Administered 2014-07-01: 16:00:00 via INTRAVENOUS
  Administered 2014-07-01: 0.3 mg via INTRAVENOUS
  Administered 2014-07-01: 9 mg via INTRAVENOUS
  Administered 2014-07-01: 13.5 mg via INTRAVENOUS
  Administered 2014-07-01: 19.38 mg via INTRAVENOUS
  Administered 2014-07-01: 7.59 mg via INTRAVENOUS
  Administered 2014-07-02: 9 mg via INTRAVENOUS
  Administered 2014-07-02: 12 mg via INTRAVENOUS
  Administered 2014-07-02 (×2): via INTRAVENOUS
  Administered 2014-07-02: 4.12 mg via INTRAVENOUS
  Administered 2014-07-02: 7.5 mg via INTRAVENOUS
  Administered 2014-07-02: 6 mg via INTRAVENOUS
  Administered 2014-07-02: 15 mg via INTRAVENOUS
  Administered 2014-07-03: 6 mg via INTRAVENOUS
  Administered 2014-07-03: 4.5 mg via INTRAVENOUS
  Administered 2014-07-03: 6.6 mg via INTRAVENOUS
  Administered 2014-07-03: 13:00:00 via INTRAVENOUS
  Administered 2014-07-03: 7.5 mg via INTRAVENOUS
  Administered 2014-07-03: 5.4 mg via INTRAVENOUS
  Filled 2014-06-29 (×9): qty 25

## 2014-06-29 MED ORDER — METOCLOPRAMIDE HCL 5 MG/ML IJ SOLN
INTRAMUSCULAR | Status: AC
  Filled 2014-06-29: qty 2

## 2014-06-29 MED ORDER — LACTATED RINGERS IR SOLN
Status: DC | PRN
  Administered 2014-06-29: 1000 mL

## 2014-06-29 MED ORDER — CISATRACURIUM BESYLATE (PF) 10 MG/5ML IV SOLN
INTRAVENOUS | Status: DC | PRN
  Administered 2014-06-29 (×4): 2 mg via INTRAVENOUS
  Administered 2014-06-29: 6 mg via INTRAVENOUS
  Administered 2014-06-29: 1 mg via INTRAVENOUS
  Administered 2014-06-29: 4 mg via INTRAVENOUS

## 2014-06-29 MED ORDER — ONDANSETRON HCL 4 MG/2ML IJ SOLN
INTRAMUSCULAR | Status: AC
Start: 2014-06-29 — End: 2014-06-29
  Filled 2014-06-29: qty 2

## 2014-06-29 MED ORDER — METHOCARBAMOL 1000 MG/10ML IJ SOLN
500.0000 mg | Freq: Four times a day (QID) | INTRAVENOUS | Status: DC | PRN
  Filled 2014-06-29: qty 5

## 2014-06-29 MED ORDER — ENOXAPARIN SODIUM 40 MG/0.4ML ~~LOC~~ SOLN
40.0000 mg | SUBCUTANEOUS | Status: DC
  Administered 2014-06-30 – 2014-07-06 (×7): 40 mg via SUBCUTANEOUS
  Filled 2014-06-29 (×9): qty 0.4

## 2014-06-29 MED ORDER — DIPHENHYDRAMINE HCL 12.5 MG/5ML PO ELIX: 12.5000 mg | ORAL_SOLUTION | Freq: Four times a day (QID) | ORAL | Status: DC | PRN

## 2014-06-29 MED ORDER — CISATRACURIUM BESYLATE 20 MG/10ML IV SOLN
INTRAVENOUS | Status: AC
  Filled 2014-06-29: qty 10

## 2014-06-29 MED ORDER — SODIUM CHLORIDE 0.9 % IJ SOLN
INTRAMUSCULAR | Status: AC
  Filled 2014-06-29: qty 10

## 2014-06-29 MED ORDER — SUCCINYLCHOLINE CHLORIDE 20 MG/ML IJ SOLN
INTRAMUSCULAR | Status: DC | PRN
  Administered 2014-06-29: 80 mg via INTRAVENOUS

## 2014-06-29 MED ORDER — FENTANYL CITRATE 0.05 MG/ML IJ SOLN
INTRAMUSCULAR | Status: AC
  Filled 2014-06-29: qty 5

## 2014-06-29 MED ORDER — CLINDAMYCIN PHOSPHATE 900 MG/50ML IV SOLN
900.0000 mg | Freq: Once | INTRAVENOUS | Status: AC
  Administered 2014-06-29: 900 mg via INTRAVENOUS
  Filled 2014-06-29: qty 50

## 2014-06-29 MED ORDER — MORPHINE SULFATE (PF) 1 MG/ML IV SOLN
INTRAVENOUS | Status: AC
  Filled 2014-06-29: qty 25

## 2014-06-29 MED ORDER — CETYLPYRIDINIUM CHLORIDE 0.05 % MT LIQD
7.0000 mL | Freq: Two times a day (BID) | OROMUCOSAL | Status: DC
  Administered 2014-06-30 – 2014-07-05 (×11): 7 mL via OROMUCOSAL

## 2014-06-29 MED ORDER — METOCLOPRAMIDE HCL 5 MG/ML IJ SOLN
INTRAMUSCULAR | Status: DC | PRN
  Administered 2014-06-29: 10 mg via INTRAVENOUS

## 2014-06-29 MED ORDER — ONDANSETRON HCL 4 MG/2ML IJ SOLN
INTRAMUSCULAR | Status: DC | PRN
  Administered 2014-06-29: 4 mg via INTRAVENOUS

## 2014-06-29 MED ORDER — FENTANYL CITRATE 0.05 MG/ML IJ SOLN
INTRAMUSCULAR | Status: DC | PRN
  Administered 2014-06-29: 50 ug via INTRAVENOUS
  Administered 2014-06-29: 100 ug via INTRAVENOUS
  Administered 2014-06-29 (×2): 50 ug via INTRAVENOUS

## 2014-06-29 MED ORDER — KETOROLAC TROMETHAMINE 30 MG/ML IJ SOLN: 15.0000 mg | Freq: Once | INTRAMUSCULAR | Status: DC | PRN

## 2014-06-29 MED ORDER — GENTAMICIN SULFATE 40 MG/ML IJ SOLN: 5.0000 mg/kg | INTRAVENOUS | Status: DC

## 2014-06-29 MED ORDER — ALVIMOPAN 12 MG PO CAPS
12.0000 mg | ORAL_CAPSULE | Freq: Once | ORAL | Status: AC
  Administered 2014-06-29: 12 mg via ORAL
  Filled 2014-06-29: qty 1

## 2014-06-29 MED ORDER — BUPIVACAINE-EPINEPHRINE (PF) 0.25% -1:200000 IJ SOLN
INTRAMUSCULAR | Status: AC
  Filled 2014-06-29: qty 30

## 2014-06-29 MED ORDER — CHLORHEXIDINE GLUCONATE 0.12 % MT SOLN
15.0000 mL | Freq: Two times a day (BID) | OROMUCOSAL | Status: DC
  Administered 2014-06-29 – 2014-07-05 (×14): 15 mL via OROMUCOSAL
  Filled 2014-06-29 (×18): qty 15

## 2014-06-29 MED ORDER — HYDROMORPHONE HCL PF 1 MG/ML IJ SOLN
INTRAMUSCULAR | Status: AC
  Filled 2014-06-29: qty 1

## 2014-06-29 MED ORDER — GENTAMICIN SULFATE 40 MG/ML IJ SOLN
5.0000 mg/kg | INTRAVENOUS | Status: AC
  Administered 2014-06-29: 320 mg via INTRAVENOUS
  Filled 2014-06-29: qty 8

## 2014-06-29 MED ORDER — DEXAMETHASONE SODIUM PHOSPHATE 10 MG/ML IJ SOLN
INTRAMUSCULAR | Status: AC
  Filled 2014-06-29: qty 1

## 2014-06-29 MED ORDER — HYDROMORPHONE HCL PF 1 MG/ML IJ SOLN
INTRAMUSCULAR | Status: DC | PRN
  Administered 2014-06-29 (×3): 0.5 mg via INTRAVENOUS
  Administered 2014-06-29 (×2): 1 mg via INTRAVENOUS
  Administered 2014-06-29: 0.5 mg via INTRAVENOUS

## 2014-06-29 MED ORDER — METOPROLOL TARTRATE 1 MG/ML IV SOLN
5.0000 mg | Freq: Four times a day (QID) | INTRAVENOUS | Status: DC
  Administered 2014-06-29 – 2014-07-01 (×5): 5 mg via INTRAVENOUS
  Filled 2014-06-29 (×13): qty 5

## 2014-06-29 MED ORDER — HEPARIN SODIUM (PORCINE) 5000 UNIT/ML IJ SOLN
5000.0000 [IU] | Freq: Once | INTRAMUSCULAR | Status: AC
  Administered 2014-06-29: 5000 [IU] via SUBCUTANEOUS
  Filled 2014-06-29: qty 1

## 2014-06-29 MED ORDER — DIPHENHYDRAMINE HCL 50 MG/ML IJ SOLN: 12.5000 mg | Freq: Four times a day (QID) | INTRAMUSCULAR | Status: DC | PRN

## 2014-06-29 MED ORDER — GLYCOPYRROLATE 0.2 MG/ML IJ SOLN
INTRAMUSCULAR | Status: DC | PRN
  Administered 2014-06-29: 0.4 mg via INTRAVENOUS

## 2014-06-29 MED ORDER — POTASSIUM CHLORIDE IN NACL 20-0.9 MEQ/L-% IV SOLN
INTRAVENOUS | Status: DC
  Administered 2014-06-29: 04:00:00 via INTRAVENOUS
  Filled 2014-06-29 (×3): qty 1000

## 2014-06-29 SURGICAL SUPPLY — 92 items
APPLIER CLIP 5 13 M/L LIGAMAX5 (MISCELLANEOUS)
APPLIER CLIP ROT 10 11.4 M/L (STAPLE)
APR CLP MED LRG 11.4X10 (STAPLE)
BLADE EXTENDED COATED 6.5IN (ELECTRODE) ×2 IMPLANT
BLADE HEX COATED 2.75 (ELECTRODE) ×2 IMPLANT
BLADE SURG SZ10 CARB STEEL (BLADE) IMPLANT
CANISTER SUCTION 2500CC (MISCELLANEOUS) IMPLANT
CATH FOLEY LATEX FREE 16FR (CATHETERS) ×2 IMPLANT
CATH SILICONE 24FR 5CC 3WAY (CATHETERS) ×2 IMPLANT
CELLS DAT CNTRL 66122 CELL SVR (MISCELLANEOUS) IMPLANT
CLAMP ENDO BABCK 10MM (STAPLE) IMPLANT
CLIP APPLIE 5 13 M/L LIGAMAX5 (MISCELLANEOUS) IMPLANT
CLIP APPLIE ROT 10 11.4 M/L (STAPLE) IMPLANT
CONNECTOR 5 IN 1 STRAIGHT STRL (MISCELLANEOUS) IMPLANT
COVER MAYO STAND STRL (DRAPES) ×2 IMPLANT
COVER SURGICAL LIGHT HANDLE (MISCELLANEOUS) IMPLANT
DECANTER SPIKE VIAL GLASS SM (MISCELLANEOUS) IMPLANT
DEVICE TROCAR PUNCTURE CLOSURE (ENDOMECHANICALS) IMPLANT
DRAPE LAPAROSCOPIC ABDOMINAL (DRAPES) ×2 IMPLANT
DRAPE LG THREE QUARTER DISP (DRAPES) ×2 IMPLANT
DRAPE WARM FLUID 44X44 (DRAPE) ×2 IMPLANT
DRSG VAC ATS MED SENSATRAC (GAUZE/BANDAGES/DRESSINGS) ×2 IMPLANT
DRSG VAC ATS SM SENSATRAC (GAUZE/BANDAGES/DRESSINGS) ×2 IMPLANT
DRSG VASELINE 3X18 (GAUZE/BANDAGES/DRESSINGS) ×2 IMPLANT
ELECT REM PT RETURN 9FT ADLT (ELECTROSURGICAL) ×2
ELECTRODE REM PT RTRN 9FT ADLT (ELECTROSURGICAL) ×1 IMPLANT
ENSEAL DEVICE STD TIP 35CM (ENDOMECHANICALS) IMPLANT
GAUZE SPONGE 4X4 12PLY STRL (GAUZE/BANDAGES/DRESSINGS) ×2 IMPLANT
GAUZE VASELINE 1X8 (GAUZE/BANDAGES/DRESSINGS) ×2 IMPLANT
GLOVE BIOGEL M STRL SZ7.5 (GLOVE) ×4 IMPLANT
GLOVE BIOGEL PI IND STRL 6.5 (GLOVE) ×2 IMPLANT
GLOVE BIOGEL PI IND STRL 7.0 (GLOVE) ×2 IMPLANT
GLOVE BIOGEL PI IND STRL 7.5 (GLOVE) ×2 IMPLANT
GLOVE BIOGEL PI INDICATOR 6.5 (GLOVE) ×2
GLOVE BIOGEL PI INDICATOR 7.0 (GLOVE) ×2
GLOVE BIOGEL PI INDICATOR 7.5 (GLOVE) ×2
GLOVE SUPERSENSE BIOGEL SZ 5.5 (GLOVE) ×4 IMPLANT
GLOVE SURG SS PI 6.5 STRL IVOR (GLOVE) ×2 IMPLANT
GOWN STRL REUS W/TWL LRG LVL3 (GOWN DISPOSABLE) ×2 IMPLANT
GOWN STRL REUS W/TWL XL LVL3 (GOWN DISPOSABLE) ×12 IMPLANT
KIT BASIN OR (CUSTOM PROCEDURE TRAY) ×2 IMPLANT
LEGGING LITHOTOMY PAIR STRL (DRAPES) ×2 IMPLANT
LIGASURE IMPACT 36 18CM CVD LR (INSTRUMENTS) ×2 IMPLANT
NS IRRIG 1000ML POUR BTL (IV SOLUTION) ×4 IMPLANT
PENCIL BUTTON HOLSTER BLD 10FT (ELECTRODE) ×2 IMPLANT
RTRCTR WOUND ALEXIS 18CM MED (MISCELLANEOUS)
SCISSORS LAP 5X35 DISP (ENDOMECHANICALS) ×2 IMPLANT
SET IRRIG TUBING LAPAROSCOPIC (IRRIGATION / IRRIGATOR) ×2 IMPLANT
SHEARS HARMONIC ACE PLUS 36CM (ENDOMECHANICALS) ×2 IMPLANT
SLEEVE ADV FIXATION 5X100MM (TROCAR) ×2 IMPLANT
SLEEVE Z-THREAD 5X100MM (TROCAR) IMPLANT
SOLUTION ANTI FOG 6CC (MISCELLANEOUS) ×2 IMPLANT
SPONGE LAP 18X18 X RAY DECT (DISPOSABLE) ×14 IMPLANT
STAPLER CUT CVD 40MM BLUE (STAPLE) ×2 IMPLANT
STAPLER CUT RELOAD BLUE (STAPLE) ×2 IMPLANT
STAPLER VISISTAT 35W (STAPLE) ×2 IMPLANT
STRIP CLOSURE SKIN 1/2X4 (GAUZE/BANDAGES/DRESSINGS) IMPLANT
SUCTION POOLE TIP (SUCTIONS) ×2 IMPLANT
SUT NOVA 1 T20/GS 25DT (SUTURE) ×2 IMPLANT
SUT PDS AB 1 CT1 27 (SUTURE) IMPLANT
SUT PDS AB 1 CTX 36 (SUTURE) IMPLANT
SUT PDS AB 1 TP1 96 (SUTURE) ×4 IMPLANT
SUT PDS AB 3-0 SH 27 (SUTURE) ×6 IMPLANT
SUT PDS AB 4-0 SH 27 (SUTURE) IMPLANT
SUT PROLENE 2 0 KS (SUTURE) IMPLANT
SUT PROLENE 3 0 SH 48 (SUTURE) ×2 IMPLANT
SUT SILK 2 0 (SUTURE) ×1
SUT SILK 2 0 SH CR/8 (SUTURE) ×4 IMPLANT
SUT SILK 2-0 18XBRD TIE 12 (SUTURE) ×1 IMPLANT
SUT SILK 3 0 (SUTURE) ×1
SUT SILK 3 0 SH CR/8 (SUTURE) ×4 IMPLANT
SUT SILK 3-0 18XBRD TIE 12 (SUTURE) ×1 IMPLANT
SUT VIC AB 2-0 CT1 27 (SUTURE)
SUT VIC AB 2-0 CT1 27XBRD (SUTURE) IMPLANT
SUT VIC AB 3-0 PS2 18 (SUTURE)
SUT VIC AB 3-0 PS2 18XBRD (SUTURE) IMPLANT
SUT VIC AB 4-0 SH 18 (SUTURE) IMPLANT
SUT VICRYL 0 UR6 27IN ABS (SUTURE) IMPLANT
SYR 30ML LL (SYRINGE) IMPLANT
SYR BULB IRRIGATION 50ML (SYRINGE) ×2 IMPLANT
SYS LAPSCP GELPORT 120MM (MISCELLANEOUS) ×2
SYSTEM LAPSCP GELPORT 120MM (MISCELLANEOUS) ×1 IMPLANT
TOWEL OR 17X26 10 PK STRL BLUE (TOWEL DISPOSABLE) ×2 IMPLANT
TRAY FOLEY CATH 14FRSI W/METER (CATHETERS) IMPLANT
TRAY LAP CHOLE (CUSTOM PROCEDURE TRAY) ×2 IMPLANT
TROCAR BLADELESS OPT 5 100 (ENDOMECHANICALS) ×2 IMPLANT
TROCAR XCEL NON-BLD 11X100MML (ENDOMECHANICALS) IMPLANT
TROCAR XCEL UNIV SLVE 11M 100M (ENDOMECHANICALS) IMPLANT
TUBING CONNECTING 10 (TUBING) ×2 IMPLANT
TUBING FILTER THERMOFLATOR (ELECTROSURGICAL) ×2 IMPLANT
YANKAUER SUCT BULB TIP 10FT TU (MISCELLANEOUS) ×2 IMPLANT
YANKAUER SUCT BULB TIP NO VENT (SUCTIONS) ×2 IMPLANT

## 2014-06-29 NOTE — Progress Notes (Signed)
Report given to 4th floor nurse Romie Levee RN 06-29-2014 18:35pm

## 2014-06-29 NOTE — Progress Notes (Signed)
Pt had order for PT/INR. Pt stated that last time she was in [facility] hospital, an IV team nurse told her to ask for an IV team nurse for a lab draw. Called IV team.  IV team stated that they do not do regular lab draws. They only do blood draws for central lines. Respoke to pt about lab draw. Pt agreed to have lab obtain blood sample. Lab came, was able to get blood needed on the first try. Pt content with procedure. Will continue to monitor.  Roselind Rily

## 2014-06-29 NOTE — Transfer of Care (Signed)
Immediate Anesthesia Transfer of Care Note  Patient: [name]  Procedure(s) Performed: Procedure(s) (LRB): LAPAROSCOPIC ASSISTED HARTMAN REVERSAL, LYSIS OF ADHESIONS, LEFT COLECTOMY, APPLICATION OF WOUND VAC (N/A)  Patient Location: PACU  Anesthesia Type: General  Level of Consciousness: sedated, patient cooperative and responds to stimulation  Airway & Oxygen Therapy: Patient Spontanous Breathing and Patient connected to face mask oxgen  Post-op Assessment: Report given to PACU RN and Post -op Vital signs reviewed and stable  Post vital signs: Reviewed and stable  Complications: No apparent anesthesia complications

## 2014-06-29 NOTE — Progress Notes (Signed)
Telephone order received from MD Marcello Moores and [name] that patient may transfer to Telemetry floor for monitoring, and per family request Neta Mends RN 06-29-2014 17:36pm

## 2014-06-29 NOTE — Brief Op Note (Signed)
06/28/2014 - 06/29/2014  3:44 PM  PATIENT:  [name]  67 y.o. female  PRE-OPERATIVE DIAGNOSIS:  h/o hartman's procedure  POST-OPERATIVE DIAGNOSIS:  h/o hartman's procedure  PROCEDURE:  Procedure(s): LAPAROSCOPIC CONVERTED TO OPEN HARTMAN REVERSAL WITH LEFT COLECTOMY, LYSIS OF ADHESIONS x 1 hour, , APPLICATION OF WOUND VAC (N/A)  SURGEON:  Surgeon(s) and Role:    * Pedro Earls, MD - Assisting    * Gayland Curry, MD - Primary  PHYSICIAN ASSISTANT: none  ASSISTANTS: see above   ANESTHESIA:   general  EBL:  Total I/O In: 4000 [I.V.:4000] Out: 400 [Urine:200; Blood:200]  BLOOD ADMINISTERED:none  DRAINS: Urinary Catheter (Foley)   LOCAL MEDICATIONS USED:  NONE  SPECIMEN:  Source of Specimen:  left colon with end ostomy  DISPOSITION OF SPECIMEN:  PATHOLOGY  COUNTS:  YES  TOURNIQUET:  * No tourniquets in log *  DICTATION: .Other Dictation: Dictation Number [id number]  PLAN OF CARE: Admit to inpatient   PATIENT DISPOSITION:  PACU - hemodynamically stable.   Delay start of Pharmacological VTE agent (>24hrs) due to surgical blood loss or risk of bleeding: no
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
## 2014-06-29 NOTE — Interval H&P Note (Signed)
History and Physical Interval Note:  06/29/2014 9:41 AM  [name]  has presented today for surgery, with the diagnosis of h/o hartman's procedure  The various methods of treatment have been discussed with the patient and family. After consideration of risks, benefits and other options for treatment, the patient has consented to  Procedure(s): LAPAROSCOPIC ASSISTED HARTMAN REVERSAL (N/A) as a surgical intervention .  The patient's history has been reviewed, patient examined, no change in status, stable for surgery.  I have reviewed the patient's chart and labs.  Questions were answered to the patient's satisfaction.    Chart reviewed.  Labs reviewed. No chest pain.  Discussed with anesthesia. Believe pt ok to proceed with surgery.  All questions asked and answered BP 147/70  Pulse 57  Temp(Src) 97.8 F (36.6 C) (Oral)  Resp 18  Ht 5\' 3"  (1.6 m)  Wt 140 lb (63.504 kg)  BMI 24.81 kg/m2  SpO2 97% Alert, nad, nontoxic  cta b/l Reg Soft, nt, nd. Ostomy LLQ No edema  [name]. [name], MD, [place], Bariatric, & Minimally Invasive Surgery [facility], [place]   [facility]
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
## 2014-06-29 NOTE — Anesthesia Preprocedure Evaluation (Signed)
Anesthesia Evaluation  Patient identified by MRN, date of birth, ID band Patient awake    Reviewed: Allergy & Precautions, H&P , NPO status , Patient's Chart, lab work & pertinent test results  Airway Mallampati: II TM Distance: >3 FB Neck ROM: Full    Dental no notable dental hx.    Pulmonary neg pulmonary ROS, former smoker,  breath sounds clear to auscultation  Pulmonary exam normal       Cardiovascular hypertension, Pt. on medications and Pt. on home beta blockers + dysrhythmias Atrial Fibrillation Rhythm:Regular Rate:Normal  Paroxysmal atrial fibrillation, maintaining SR on coumadin  01/27/2013    Neuro/Psych negative neurological ROS  negative psych ROS   GI/Hepatic negative GI ROS, Neg liver ROS,   Endo/Other  negative endocrine ROS  Renal/GU negative Renal ROS  negative genitourinary   Musculoskeletal negative musculoskeletal ROS (+)   Abdominal   Peds negative pediatric ROS (+)  Hematology negative hematology ROS (+)   Anesthesia Other Findings   Reproductive/Obstetrics negative OB ROS                           Anesthesia Physical Anesthesia Plan  ASA: III  Anesthesia Plan: General   Post-op Pain Management:    Induction: Intravenous  Airway Management Planned: Oral ETT  Additional Equipment:   Intra-op Plan:   Post-operative Plan: Extubation in OR  Informed Consent: I have reviewed the patients History and Physical, chart, labs and discussed the procedure including the risks, benefits and alternatives for the proposed anesthesia with the patient or authorized representative who has indicated his/her understanding and acceptance.   Dental advisory given  Plan Discussed with: CRNA and Surgeon  Anesthesia Plan Comments:         Anesthesia Quick Evaluation

## 2014-06-30 ENCOUNTER — Inpatient Hospital Stay (HOSPITAL_COMMUNITY): Payer: Medicare Other

## 2014-06-30 ENCOUNTER — Encounter (HOSPITAL_COMMUNITY): Payer: Self-pay | Admitting: General Surgery

## 2014-06-30 LAB — BASIC METABOLIC PANEL
Anion gap: 13 (ref 5–15)
BUN: 10 mg/dL (ref 6–23)
CO2: 22 mEq/L (ref 19–32)
Calcium: 8.3 mg/dL — ABNORMAL LOW (ref 8.4–10.5)
Chloride: 101 mEq/L (ref 96–112)
Creatinine, Ser: 0.55 mg/dL (ref 0.50–1.10)
GFR calc Af Amer: >90 mL/min (ref >90)
GFR calc non Af Amer: >90 mL/min (ref >90)
Glucose, Bld: 220 mg/dL — ABNORMAL HIGH (ref 70–99)
Potassium: 4.9 mEq/L (ref 3.7–5.3)
Sodium: 136 mEq/L — ABNORMAL LOW (ref 137–147)

## 2014-06-30 LAB — CBC
HCT: 37.1 % (ref 36.0–46.0)
Hemoglobin: 12.8 g/dL (ref 12.0–15.0)
MCH: 30.4 pg (ref 26.0–34.0)
MCHC: 34.5 g/dL (ref 30.0–36.0)
MCV: 88.1 fL (ref 78.0–100.0)
Platelets: 236 10*3/uL (ref 150–400)
RBC: 4.21 MIL/uL (ref 3.87–5.11)
RDW: 14.6 % (ref 11.5–15.5)
WBC: 19.6 10*3/uL — ABNORMAL HIGH (ref 4.0–10.5)

## 2014-06-30 MED ORDER — SODIUM CHLORIDE 0.9 % IJ SOLN
10.0000 mL | INTRAMUSCULAR | Status: DC | PRN
  Administered 2014-07-01 – 2014-07-05 (×9): 10 mL

## 2014-06-30 MED ORDER — SODIUM CHLORIDE 0.9 % IJ SOLN
10.0000 mL | Freq: Two times a day (BID) | INTRAMUSCULAR | Status: DC
  Administered 2014-07-04: 10 mL

## 2014-06-30 MED ORDER — POTASSIUM CHLORIDE IN NACL 20-0.45 MEQ/L-% IV SOLN
INTRAVENOUS | Status: DC
  Administered 2014-06-30 – 2014-07-04 (×10): via INTRAVENOUS
  Filled 2014-06-30 (×13): qty 1000

## 2014-06-30 NOTE — Care Management (Signed)
CARE MANAGEMENT NOTE 06/30/2014  Patient:  [name], [name]   Account Number:  [id number]  Date Initiated:  06/30/2014  Documentation initiated by:  Leafy Kindle  Subjective/Objective Assessment:   68 yo female admitted for Colostomy takedown.     Action/Plan:   From home   Anticipated DC Date:  07/03/2014   Anticipated DC Plan:  [place]  CM consult      Choice offered to / List presented to:             Status of service:  In process, will continue to follow Medicare Important Message given?   (If response is "NO", the following Medicare IM given date fields will be blank) Date Medicare IM given:   Medicare IM given by:   Date Additional Medicare IM given:   Additional Medicare IM given by:    Discharge Disposition:    Per UR Regulation:  Reviewed for med. necessity/level of care/duration of stay  If discussed at [place] of Stay Meetings, dates discussed:    Comments:  06/30/14 Marney Doctor RN,BSN NCM Has wound VAC, [employer] following her, POD #1 takedown, for PICC, may need HH, continue to follow.

## 2014-06-30 NOTE — Anesthesia Postprocedure Evaluation (Signed)
  Anesthesia Post-op Note  Patient: [name]  Procedure(s) Performed: Procedure(s) (LRB): LAPAROSCOPIC ASSISTED HARTMAN REVERSAL, LYSIS OF ADHESIONS, LEFT COLECTOMY, APPLICATION OF WOUND VAC (N/A)  Patient Location: PACU  Anesthesia Type: General  Level of Consciousness: awake and alert   Airway and Oxygen Therapy: Patient Spontanous Breathing  Post-op Pain: mild  Post-op Assessment: Post-op Vital signs reviewed, Patient's Cardiovascular Status Stable, Respiratory Function Stable, Patent Airway and No signs of Nausea or vomiting  Last Vitals:  Filed Vitals:   06/30/14 0721  BP:   Pulse:   Temp:   Resp: 16    Post-op Vital Signs: stable   Complications: No apparent anesthesia complications

## 2014-06-30 NOTE — Op Note (Signed)
[name], DARNOLD NO.:  [id number]  MEDICAL RECORD NO.:  [id number]  LOCATION:  44                         FACILITY:  [facility]  PHYSICIAN:  [name]. [name], MD, FACSDATE OF BIRTH:  03-31-1946  DATE OF PROCEDURE:  06/29/2014 DATE OF DISCHARGE:                              OPERATIVE REPORT   PREOPERATIVE DIAGNOSIS:  History of sigmoid diverticulitis status post Hartmann procedure.  POSTOPERATIVE DIAGNOSIS:  History of sigmoid diverticulitis status post Hartmann procedure.  PROCEDURE:  Laparoscopic converted to open Hartmann reversal with left colectomy, lysis of adhesions for 1 hour, application of wound VAC.  SURGEON:  [name]. [name], MD, FACS  ASSISTANT SURGEON:  Isabel Caprice. Hassell Done, MD FACS  ANESTHESIA:  General.  EBL:  200 mL.  SPECIMEN:  Left colon with end colostomy.  FINDINGS:  The patient had essentially re-peritonealized the splenic flexure that we had mobilized during her initial emergency surgery and it was quite stuck posteriorly and the planes between the transverse colon and stomach were challenging to identify, so the majority of the procedure was done open.  She ended up having hand-sewn side-to-end anastomosis (Joel-Baker anastomosis) between her transverse colon and her upper rectum around the level of the sacral promontory.   INDICATIONS FOR PROCEDURE:  The patient is a very pleasant 68 year old Caucasian female who underwent emergent Hartmann procedure for ruptured diverticulitis.  She has recovered.  She has undergone surveillance colonoscopy, which revealed no evidence of malignancy, and she had a barium enema, which demonstrated no downstream stricture.  We discussed the risks and benefits of surgery including, but not limited to, bleeding, infection, injury to surrounding structures, need to convert to an open procedure, anastomotic stricture, anastomotic leak, blood clot, postoperative ileus, incisional hernia, wound infection,  prolonged hospitalization, perioperative cardiac and pulmonary complications, as well as typical recovery.  While the patient was doing her bowel prep over the weekend specifically yesterday, she had completed her liquid bowel prep and was doing oral antibiotics when she developed extreme nausea, vomiting, diaphoresis, and some chest pain.  This prompted her to go to the hospital where she was evaluated at [facility], and the patient requested transfer admission to [facility] for observation prior to her surgery for today.  She was seen and evaluated by one of my partners.  Her cardiac workup was negative.  She was felt safe to proceed to surgery.  DESCRIPTION OF PROCEDURE:  After obtaining informed consent, the patient was brought to the holding area.  She had been given 5000 units of subcutaneous heparin.  She had been given a dose of Entereg.  She was then taken to OR 1 at [facility], placed supine on the operating room table.  General endotracheal anesthesia was established. A Foley catheter was placed.  She was placed in lithotomy position with the appropriate padding and sequential compression devices.  I sewed the end of her ostomy opening, closed with 2-0 silk suture.  Her abdomen was then prepped and draped in usual standard surgical fashion.  Her perineum and rectum were prepped and draped in usual standard surgical fashion as well.  She received preoperative antibiotics consisting of clindamycin and gentamicin because of her antibiotic  allergy.  A surgical time-out was performed.  I gained access to the abdomen in the right upper quadrant using the Optiview technique with a 5-mm 0 degree laparoscope.  Pneumoperitoneum was smoothly established without any change in the patient's vital signs.  The abdominal cavity was surveilled.  She had omental adhesions to her anterior abdominal wall. There was no evidence of injury to surrounding structures.  A 5-mm trocar was  placed in the right lower quadrant under direct visualization.  I then gently started taking down some of these adhesions in the midline with EndoShears with and without electrocautery.  I was able to free the midline.  I placed another 5-mm trocar in the lower midline under direct visualization.  I then started to take down the adhesions around the colostomy site.  She did have some omentum stuck into her pelvis and I was able to lift some of it up out of the pelvis.  I was unable to see the splenic flexure that well laparoscopic initially.  She appeared to have reperitonealized the splenic flexure laterally and posteriorly even though I had mobilized it during her initial surgery.  I continued to work a little bit longer laparoscopic, but felt that I was not gaining much headway, so I opened up her lower midline with a #10 blade, removing the 5-mm trocar from the midline.  Wound protector was initially placed.  I identified the rectal stump, which had been tacked to the left pelvic inlet.  There had been some small bowel, which had adhered to the Prolene.  I was able to lyse that with Metzenbaums.  There were also interloop small bowel adhesions, which were taken down sharply.  The rectal stump was identified.  There were a few diverticula just below the staple line.  I decided to take a small section of that to where I could not delineate any additional diverticula.  It was mobilized posteriorly and then fired across the upper rectum with a contour stapler, this essentially excised about 3 cm of upper rectum.  At this point, I decided to take down the colostomy. A curvilinear incision was made around the ostomy with Bovie electrocautery.  I took it down to the abdominal wall and divided and separated it from the fascia.  I was able to pull the colostomy back into the abdomen.  I started taking down some of the lateral attachments, but again as I found during her initial surgery, she had  a very droopy transverse colon, but the splenic flexure was very high in her left upper quadrant.  I decided to go back in laparoscopically and a GelPort was placed on top of the wound protector.  We went back in laparoscopic, I was able to achieve some mobilization, but it was just really stuck posteriorly up at the splenic flexure again.  At this point, I converted to an open, placed a Bookwalter retractor.  I was then able to take down the adhesions around the splenic flexure again in a serial fashion using electrocautery as well as LigaSure device.  This freed the entire left colon down to the end colostomy.  At this point, the left colon no longer had adequate blood supply, so I ended up transecting it with a contour stapler at the level of the distal transverse colon.  This was passed off the field and labeled left colon with end colostomy.  There was  adequate length to reach down to the upper rectum.  I had my partner go below  when we were unable to pass a dilator all the way up to the end of the upper rectum from the anal verge to the end of the upper rectum, therefore I decided to do a hand-sewn side-to-end anastomosis.  The colon was lined up with the rectal staple line.  Stay sutures were placed on each end of the rectal staple line.  I previously identified the ureters, which were preserved and completely out of the way.  The small bowel was packed up out of the pelvis.  I ended up doing a posterior row of 3-0 silk sutures just on the inferior aspect of the tinea to the posterior wall of the rectal stump.  I then made an enterotomy into the proximal colon along the tinea.  First, I excised the rectal staple line.  The width of her upper rectum was not that wide.  I made the same length opening in the proximal colon along the tinea with electrocautery.  Then, using a 3-0 PDS x2, I did an inner layer starting along the back wall and running it around the corners anteriorly.  We  then placed a 24-French catheter, which had been trimmed as an internal stent, while I was sewing the anterior wall closed.  Just prior to completing the anterior layer of 3-0 PDS, we removed 24-French stent.  It appeared that it was air tight.  I then placed an outer anterior layer of 3-0 silk sutures in a Lembert fashion.  Dr. Hassell Done then went below and insufflated the rectum with air.  Meanwhile, I bathed the anastomosis in the pelvis with water and there were no air bubbles.  There was good distention of the anastomosis and air gap passed the anastomosis into the upper colon.  The bowel was decompressed.  I then irrigated the abdomen copiously.  There was no evidence of bleeding.  I then ran the small bowel to make sure there had been no injuries.  There were a few additional interloop adhesions, which were taken down.  I then closed the old ostomy site with interrupted #1 Novafil sutures.  There was no palpable fascial defect left.  I then closed the lower midline with a #1 looped PDS x2, one from above, one from below.  Prior to closing the abdomen, I did a finger sweep to ensure nothing was entrapped within our fascial closure and the 2 remaining right-sided trocars had been removed prior to fascial closure.  At this point, I obtained a wound VAC sponge, trimmed it for the subcutaneous tissue in the midline as well as for the old ostomy site, placed a bridge of Adaptic between the two and applied the wound VAC in a typical fashion.  Had a good seal.  The patient was extubated and brought to the recovery room in stable condition.  All needle, instrument, and sponge counts were correct x2.  There were no immediate complications.  The patient tolerated the procedure well.     [name]. [name], MD, FACS     EMW/MEDQ  D:  06/29/2014  T:  06/30/2014  Job:  [id number]

## 2014-06-30 NOTE — Consult Note (Signed)
WOC wound consult note Reason for Consult: midline surgical wound with NPWT VAC dressing in place Wound type: surgical  Dressing procedure/placement/frequency: Dr. [name] at bedside, will plan to change VAC dressing for the first time on Wednesday.  Midline and takedown site with NPWT.  Supplies ordered    [employer], [place] [phone])

## 2014-06-30 NOTE — Progress Notes (Signed)
1 Day Post-Op  Subjective: C/o difficulty with blood draws - had to stick finger. Desires PICC line. No flatus. Some burping. Wound care nurse at bs. Pain tolerable.   Objective: Vital signs in last 24 hours: Temp:  [97.5 F (36.4 C)-98 F (36.7 C)] 97.5 F (36.4 C) (08/18 0542) Pulse Rate:  [71-103] 71 (08/18 0542) Resp:  [9-19] 16 (08/18 0721) BP: (126-180)/(58-92) 133/58 mmHg (08/18 0542) SpO2:  [94 %-100 %] 98 % (08/18 0721) Last BM Date: 06/29/14 (pre-op)  Intake/Output from previous day: 08/17 0701 - 08/18 0700 In: 4718.8 [I.V.:4718.8] Out: 1105 [Urine:900; Drains:5; Blood:200] Intake/Output this shift:    Alert, nad cta b/l Reg Soft, nd. approp  TTP. Wound vac intact. Skin ok +SCDS  Lab Results:   Recent Labs  06/30/14 0614  WBC 19.6*  HGB 12.8  HCT 37.1  PLT 236   BMET  Recent Labs  06/30/14 0435  NA 136*  K 4.9  CL 101  CO2 22  GLUCOSE 220*  BUN 10  CREATININE 0.55  CALCIUM 8.3*   PT/INR  Recent Labs  06/29/14 0520  LABPROT 15.1  INR 1.19   ABG No results found for this basename: PHART, PCO2, PO2, HCO3,  in the last 72 hours  Studies/Results: No results found.  Anti-infectives: Anti-infectives   Start     Dose/Rate Route Frequency Ordered Stop   06/29/14 1800  clindamycin (CLEOCIN) IVPB 900 mg     900 mg 100 mL/hr over 30 Minutes Intravenous  Once 06/29/14 1713 06/29/14 1910   06/29/14 1713  gentamicin (GARAMYCIN) 320 mg in dextrose 5 % 100 mL IVPB  Status:  Discontinued     5 mg/kg  64.4 kg (Order-Specific) 108 mL/hr over 60 Minutes Intravenous 60 min pre-op 06/29/14 1713 06/29/14 1722   06/29/14 0945  clindamycin (CLEOCIN) IVPB 900 mg     900 mg 100 mL/hr over 30 Minutes Intravenous  Once 06/29/14 0932 06/29/14 1007   06/29/14 0945  gentamicin (GARAMYCIN) 300 mg in dextrose 5 % 100 mL IVPB  Status:  Discontinued     300 mg 107.5 mL/hr over 60 Minutes Intravenous  Once 06/29/14 0932 06/29/14 0941   06/29/14 0941  gentamicin  (GARAMYCIN) 320 mg in dextrose 5 % 100 mL IVPB     5 mg/kg  64.4 kg (Order-Specific) 108 mL/hr over 60 Minutes Intravenous 60 min pre-op 06/29/14 0942 06/29/14 1100      Assessment/Plan: s/p Procedure(s): LAPAROSCOPIC ASSISTED HARTMAN REVERSAL, LYSIS OF ADHESIONS, LEFT COLECTOMY, APPLICATION OF WOUND VAC (N/A) Continue foley due to urinary output monitoring Water/ice chips/entereg VTE prophylaxis - lovenox, scds Change IVF given high glucose PICC line for blood draws OOB, IS, pulm toilet  Bridget Ruff. [name], MD, FACS General, Bariatric, & Minimally Invasive Surgery [name]   LOS: 2 days    [name] 06/30/2014

## 2014-07-01 LAB — CBC WITH DIFFERENTIAL/PLATELET
Basophils Absolute: 0 10*3/uL (ref 0.0–0.1)
Basophils Relative: 0 % (ref 0–1)
Eosinophils Absolute: 0 10*3/uL (ref 0.0–0.7)
Eosinophils Relative: 0 % (ref 0–5)
HCT: 28.1 % — ABNORMAL LOW (ref 36.0–46.0)
Hemoglobin: 9.6 g/dL — ABNORMAL LOW (ref 12.0–15.0)
Lymphocytes Relative: 22 % (ref 12–46)
Lymphs Abs: 2.8 10*3/uL (ref 0.7–4.0)
MCH: 30.9 pg (ref 26.0–34.0)
MCHC: 34.2 g/dL (ref 30.0–36.0)
MCV: 90.4 fL (ref 78.0–100.0)
Monocytes Absolute: 0.8 10*3/uL (ref 0.1–1.0)
Monocytes Relative: 6 % (ref 3–12)
Neutro Abs: 8.9 10*3/uL — ABNORMAL HIGH (ref 1.7–7.7)
Neutrophils Relative %: 71 % (ref 43–77)
Platelets: 221 10*3/uL (ref 150–400)
RBC: 3.11 MIL/uL — ABNORMAL LOW (ref 3.87–5.11)
RDW: 14.9 % (ref 11.5–15.5)
WBC: 12.5 10*3/uL — ABNORMAL HIGH (ref 4.0–10.5)

## 2014-07-01 LAB — BASIC METABOLIC PANEL
Anion gap: 9 (ref 5–15)
BUN: 7 mg/dL (ref 6–23)
CO2: 27 mEq/L (ref 19–32)
Calcium: 8.2 mg/dL — ABNORMAL LOW (ref 8.4–10.5)
Chloride: 100 mEq/L (ref 96–112)
Creatinine, Ser: 0.5 mg/dL (ref 0.50–1.10)
GFR calc Af Amer: >90 mL/min (ref >90)
GFR calc non Af Amer: >90 mL/min (ref >90)
Glucose, Bld: 93 mg/dL (ref 70–99)
Potassium: 4.4 mEq/L (ref 3.7–5.3)
Sodium: 136 mEq/L — ABNORMAL LOW (ref 137–147)

## 2014-07-01 MED ORDER — METOPROLOL SUCCINATE ER 25 MG PO TB24
25.0000 mg | ORAL_TABLET | Freq: Two times a day (BID) | ORAL | Status: DC
  Administered 2014-07-01 – 2014-07-04 (×7): 25 mg via ORAL
  Filled 2014-07-01 (×10): qty 1

## 2014-07-01 MED ORDER — ISOSORBIDE MONONITRATE ER 30 MG PO TB24
30.0000 mg | ORAL_TABLET | Freq: Every morning | ORAL | Status: DC
  Administered 2014-07-01 – 2014-07-06 (×5): 30 mg via ORAL
  Filled 2014-07-01 (×6): qty 1

## 2014-07-01 NOTE — Clinical Documentation Improvement (Signed)
  Patient status post colostomy takedown and LOA 06/29/14.  Preop H&H 13.641.3 per [facility].  Postop H&H 12.8/37.1 ob 06/30/14.  Repeat postop H&H 9.6/28.1 on 07/01/14.  Intraop IV Fluid total 4000 ml.  EBL 200 ml.  Urine output postop total per [facility] - 3950 ml.  Possible Clinical Conditions:   - Acute Blood Loss Anemia   - Other Condition   - Unable to Clinically Determine  Thank You, Erling Conte ,RN Clinical Documentation Specialist:  [phone] [place] [employer]

## 2014-07-01 NOTE — Progress Notes (Signed)
Report received from [place], [place].  No change in am assessment. Continue current plan of care. Stacey Drain

## 2014-07-01 NOTE — Progress Notes (Signed)
2 Days Post-Op  Subjective: No nausea. Some hiccups. No flatus. Walked yesterday. Spent several hrs in chair. Got PICC.   Objective: Vital signs in last 24 hours: Temp:  [97.4 F (36.3 C)-98.5 F (36.9 C)] 97.4 F (36.3 C) (08/19 0703) Pulse Rate:  [65-79] 70 (08/19 0703) Resp:  [14-18] 15 (08/19 0800) BP: (131-155)/(48-66) 135/51 mmHg (08/19 0703) SpO2:  [92 %-100 %] 100 % (08/19 0800) Last BM Date: 06/29/14  Intake/Output from previous day: 08/18 0701 - 08/19 0700 In: 1292.5 [P.O.:180; I.V.:1112.5] Out: 2400 [Urine:2400] Intake/Output this shift:    Alert, nad cta b/l Reg Soft, nd, approp TTP. Wound vac intact  no edema  Lab Results:   Recent Labs  06/30/14 0614 07/01/14 0419  WBC 19.6* 12.5*  HGB 12.8 9.6*  HCT 37.1 28.1*  PLT 236 221   BMET  Recent Labs  06/30/14 0435 07/01/14 0419  NA 136* 136*  K 4.9 4.4  CL 101 100  CO2 22 27  GLUCOSE 220* 93  BUN 10 7  CREATININE 0.55 0.50  CALCIUM 8.3* 8.2*   PT/INR  Recent Labs  06/29/14 0520  LABPROT 15.1  INR 1.19   ABG No results found for this basename: PHART, PCO2, PO2, HCO3,  in the last 72 hours  Studies/Results: Ir US Guide Vasc Access Right  06/30/2014   CLINICAL DATA:  68 year old female with limited intravenous access in need of long-term IV antibiotics.  EXAM: POWER PICC LINE PLACEMENT WITH ULTRASOUND AND FLUOROSCOPIC GUIDANCE  FLUOROSCOPY TIME:  42 seconds  PROCEDURE: The patient was advised of the possible risks and complications and agreed to undergo the procedure. The patient was then brought to the angiographic suite for the procedure.  The right arm was prepped with chlorhexidine, draped in the usual sterile fashion using maximum barrier technique (cap and mask, sterile gown, sterile gloves, large sterile sheet, hand hygiene and cutaneous antisepsis) and infiltrated locally with 1% Lidocaine.  Ultrasound demonstrated patency of the right brachial vein, and this was documented with an  image. Under real-time ultrasound guidance, this vein was accessed with a 21 gauge micropuncture needle and image documentation was performed. A 0.018 wire was introduced in to the vein. Over this, a 5 [place] dual lumen power injectable PICC was advanced to the lower SVC/right atrial junction. Fluoroscopy during the procedure and fluoro spot radiograph confirms appropriate catheter position. The catheter was flushed and covered with a sterile dressing.  Catheter length: 37 cm  Complications: None  IMPRESSION: Successful right arm power PICC line placement with ultrasound and fluoroscopic guidance. The catheter is ready for use.  Signed,  Criselda Peaches, MD  [facility]  [facility]   Electronically Signed   By: Jacqulynn Cadet M.D.   On: 06/30/2014 17:09   Ir Fluoro Guide Cv Midline Picc Right  06/30/2014   CLINICAL DATA:  68 year old female with limited intravenous access in need of long-term IV antibiotics.  EXAM: POWER PICC LINE PLACEMENT WITH ULTRASOUND AND FLUOROSCOPIC GUIDANCE  FLUOROSCOPY TIME:  42 seconds  PROCEDURE: The patient was advised of the possible risks and complications and agreed to undergo the procedure. The patient was then brought to the angiographic suite for the procedure.  The right arm was prepped with chlorhexidine, draped in the usual sterile fashion using maximum barrier technique (cap and mask, sterile gown, sterile gloves, large sterile sheet, hand hygiene and cutaneous antisepsis) and infiltrated locally with 1% Lidocaine.  Ultrasound demonstrated patency of the right brachial vein,  and this was documented with an image. Under real-time ultrasound guidance, this vein was accessed with a 21 gauge micropuncture needle and image documentation was performed. A 0.018 wire was introduced in to the vein. Over this, a 5 [place] dual lumen power injectable PICC was advanced to the lower SVC/right atrial junction. Fluoroscopy during the  procedure and fluoro spot radiograph confirms appropriate catheter position. The catheter was flushed and covered with a sterile dressing.  Catheter length: 37 cm  Complications: None  IMPRESSION: Successful right arm power PICC line placement with ultrasound and fluoroscopic guidance. The catheter is ready for use.  Signed,  Criselda Peaches, MD  [facility]  [facility]   Electronically Signed   By: Jacqulynn Cadet M.D.   On: 06/30/2014 17:09    Anti-infectives: Anti-infectives   Start     Dose/Rate Route Frequency Ordered Stop   06/29/14 1800  clindamycin (CLEOCIN) IVPB 900 mg     900 mg 100 mL/hr over 30 Minutes Intravenous  Once 06/29/14 1713 06/29/14 1910   06/29/14 1713  gentamicin (GARAMYCIN) 320 mg in dextrose 5 % 100 mL IVPB  Status:  Discontinued     5 mg/kg  64.4 kg (Order-Specific) 108 mL/hr over 60 Minutes Intravenous 60 min pre-op 06/29/14 1713 06/29/14 1722   06/29/14 0945  clindamycin (CLEOCIN) IVPB 900 mg     900 mg 100 mL/hr over 30 Minutes Intravenous  Once 06/29/14 0932 06/29/14 1007   06/29/14 0945  gentamicin (GARAMYCIN) 300 mg in dextrose 5 % 100 mL IVPB  Status:  Discontinued     300 mg 107.5 mL/hr over 60 Minutes Intravenous  Once 06/29/14 0932 06/29/14 0941   06/29/14 0941  gentamicin (GARAMYCIN) 320 mg in dextrose 5 % 100 mL IVPB     5 mg/kg  64.4 kg (Order-Specific) 108 mL/hr over 60 Minutes Intravenous 60 min pre-op 06/29/14 0942 06/29/14 1100      Assessment/Plan: s/p Procedure(s): LAPAROSCOPIC ASSISTED HARTMAN REVERSAL, LYSIS OF ADHESIONS, LEFT COLECTOMY, APPLICATION OF WOUND VAC (N/A)  Will allow clears - reminded pt to take it slow PT Ambulate Restart some home BP meds today Cont VTE prophylaxis D/c foley  [name]. [name], MD, FACS General, Bariatric, & Minimally Invasive Surgery [facility], [place]   LOS: 3 days    Gayland Curry 07/01/2014

## 2014-07-01 NOTE — Care Management Note (Addendum)
    Page 1 of 2   07/03/2014     2:50:08 PM CARE MANAGEMENT NOTE 07/03/2014  Patient:  [name], [name]   Account Number:  [id number]  Date Initiated:  06/30/2014  Documentation initiated by:  Leafy Kindle  Subjective/Objective Assessment:   68 yo female admitted for Colostomy takedown.     Action/Plan:   From home   Anticipated DC Date:  07/06/2014   Anticipated DC Plan:  [place]  CM consult      Choice offered to / List presented to:  C-1 Patient           Status of service:  In process, will continue to follow Medicare Important Message given?  YES (If response is "NO", the following Medicare IM given date fields will be blank) Date Medicare IM given:  07/01/2014 Medicare IM given by:  Leafy Kindle Date Additional Medicare IM given:   Additional Medicare IM given by:    Discharge Disposition:    Per UR Regulation:  Reviewed for med. necessity/level of care/duration of stay  If discussed at Long Length of Stay Meetings, dates discussed:    Comments:  07/03/14 Amylee Lodato RN,BSN NCM [place].FAXED SIGNED ORDERS,& INFO TO [facility][phone],IAX#[phone].[facility] REP KRISTEN AWARE OF HHRN NEEDED.AWAIT FINAL HHC ORDER.  07/02/14 Brynnly Bonet RN,BSN NCM [phone] PER [facility] AWAIT BOWEL FXN,AWARE OF WOUND VAC FORM ON CHART FOR SIGNATURE.[facility] & [facility] REPS FOLLOWING.AWAIT FINAL ORDERS.  07/01/14 Dreyden Rohrman RN,BSN NCM [phone] PT-N/A.[facility] HHRN-CHOSEN IF [facility] WOUND VAC NEEDED,SPOKE TO KRISTEN [facility] REP.FORMS ON CHART FOR MD DATE & SIGNATURE.[facility] REP [place] FOR [facility] WOUND VAC.AWAIT [place].  06/30/14 Marney Doctor RN,BSN NCM Has wound VAC, WOC following her, POD #1 takedown, for PICC, may need HH, continue to follow.

## 2014-07-01 NOTE — Evaluation (Signed)
Physical Therapy Evaluation Patient Details Name: [name] MRN: [id number] DOB: Mar 22, 1946 Today's Date: 07/01/2014   History of Present Illness  Patient status post colostomy takedown and lysis of adhesions, wound VAC  06/29/14  Clinical Impression  Patient is very motivated and walked  In hall  Even though she stated she was tired after bath. Pt will benefit from PT while in acute  To return to independent level of function.    Follow Up Recommendations No PT follow up    Equipment Recommendations  None recommended by PT    Recommendations for Other Services       Precautions / Restrictions Precautions Precaution Comments: multiple lines,wound VAC      Mobility  Bed Mobility Overal bed mobility: Needs Assistance Bed Mobility: Supine to Sit     Supine to sit: Supervision        Transfers Overall transfer level: Needs assistance Equipment used: Rolling walker (2 wheeled) Transfers: Sit to/from Stand Sit to Stand: Min assist         General transfer comment: use of rail to push self up from commode  Ambulation/Gait Ambulation/Gait assistance: Min assist Ambulation Distance (Feet): 250 Feet Assistive device: Rolling walker (2 wheeled) Gait Pattern/deviations: Step-through pattern;Trunk flexed     General Gait Details: manages RW well  Stairs            Wheelchair Mobility    Modified Rankin (Stroke Patients Only)       Balance                                             Pertinent Vitals/Pain Pain Assessment: 0-10 Pain Score: 6  Pain Location: abdomen Pain Descriptors / Indicators: Constant;Cramping Pain Intervention(s): Monitored during session;PCA encouraged    Home Living Family/patient expects to be discharged to:: Private residence Living Arrangements: Alone Available Help at Discharge: Family;Available PRN/intermittently Type of Home: House Home Access: Stairs to enter Entrance Stairs-Rails:  None Entrance Stairs-Number of Steps: 2 Home Layout: One level Home Equipment: Tub bench;Walker - 4 wheels      Prior Function Level of Independence: Independent   Gait / Transfers Assistance Needed: stated she walked 3 miles a day     Comments: still drives, home mgt     Hand Dominance        Extremity/Trunk Assessment   Upper Extremity Assessment: Overall WFL for tasks assessed           Lower Extremity Assessment: Overall WFL for tasks assessed         Communication      Cognition Arousal/Alertness: Awake/alert Behavior During Therapy: WFL for tasks assessed/performed Overall Cognitive Status: Within Functional Limits for tasks assessed                      General Comments      Exercises        Assessment/Plan    PT Assessment Patient needs continued PT services  PT Diagnosis Difficulty walking;Acute pain   PT Problem List Decreased activity tolerance;Decreased mobility;Pain  PT Treatment Interventions DME instruction;Gait training;Functional mobility training;Therapeutic activities;Therapeutic exercise;Patient/family education   PT Goals (Current goals can be found in the Care Plan section) Acute Rehab PT Goals Patient Stated Goal: to get back to my exercise PT Goal Formulation: With patient Time For Goal Achievement: 07/15/14 Potential to Achieve Goals: Good  Frequency Min 3X/week   Barriers to discharge        Co-evaluation               End of Session   Activity Tolerance: Patient tolerated treatment well Patient left: in chair;with call bell/phone within reach Nurse Communication: Mobility status         Time: 3748-2707 PT Time Calculation (min): 27 min   Charges:   PT Evaluation $Initial PT Evaluation Tier I: 1 Procedure PT Treatments $Gait Training: 23-37 mins   PT G Codes:          [name] 07/01/2014, 4:14 PM

## 2014-07-01 NOTE — Consult Note (Signed)
WOC wound follow up Wound type: surgical Measurement: 13cm x 2.5cm x 1.0cm midline, ostomy take down site 1.5cm x 3.0cm x 2.0cm  Wound bed: both are clean, pink, moist, oozing sanguinous drainage quite a bit at the midline  Drainage (amount, consistency, odor) moderate amount in the canister  Periwound:intact Dressing procedure/placement/frequency: 1pc of black granufoam placed in the midline, drape used to protect the skin between the midline and the takedown site. 1pc of black granufoam used to fill the takedown site. 1pc of black granufoam used to bridge the two sites together.   Discussed with bedside nurse, however not sure if the nurse on Friday will be aware of bridging techniques.  Will have my partner follow up with the bedside nurse on Friday when the next dressing is due to verify if [facility] nurse is needed for assistance with the dressing change.  Maicee Ullman Dearing RN,CWOCN [phone]

## 2014-07-02 LAB — GLUCOSE, CAPILLARY: Glucose-Capillary: 94 mg/dL (ref 70–99)

## 2014-07-02 MED ORDER — WARFARIN SODIUM 3 MG PO TABS
3.0000 mg | ORAL_TABLET | Freq: Once | ORAL | Status: AC
  Administered 2014-07-02: 3 mg via ORAL
  Filled 2014-07-02: qty 1

## 2014-07-02 MED ORDER — WARFARIN - PHARMACIST DOSING INPATIENT: Freq: Every day | Status: DC

## 2014-07-02 NOTE — Progress Notes (Signed)
3 Days Post-Op  Subjective: Lots of getting up and down yesterday b/c of urination. No nausea. Clears ok. +burping. No flatus. Pain but controlled with PCA. Had wound vac change  Objective: Vital signs in last 24 hours: Temp:  [98 F (36.7 C)-98.2 F (36.8 C)] 98.1 F (36.7 C) (08/20 0427) Pulse Rate:  [69-79] 79 (08/20 0427) Resp:  [14-19] 16 (08/20 0839) BP: (116-150)/(52-59) 150/52 mmHg (08/20 0427) SpO2:  [95 %-99 %] 98 % (08/20 0839) Weight:  [146 lb 12.8 oz (66.588 kg)] 146 lb 12.8 oz (66.588 kg) (08/20 0427) Last BM Date: 06/28/14  Intake/Output from previous day: 08/19 0701 - 08/20 0700 In: 3660 [P.O.:660; I.V.:3000] Out: 4100 [Urine:4100] Intake/Output this shift: Total I/O In: -  Out: 450 [Urine:450]  Alert, nad, sitting on edge of bed cta b/l Reg Soft, nd, approp TTP. Wound vac intact No edema  Lab Results:   Recent Labs  06/30/14 0614 07/01/14 0419  WBC 19.6* 12.5*  HGB 12.8 9.6*  HCT 37.1 28.1*  PLT 236 221   BMET  Recent Labs  06/30/14 0435 07/01/14 0419  NA 136* 136*  K 4.9 4.4  CL 101 100  CO2 22 27  GLUCOSE 220* 93  BUN 10 7  CREATININE 0.55 0.50  CALCIUM 8.3* 8.2*   PT/INR No results found for this basename: LABPROT, INR,  in the last 72 hours ABG No results found for this basename: PHART, PCO2, PO2, HCO3,  in the last 72 hours  Studies/Results: Ir US Guide Vasc Access Right  06/30/2014   CLINICAL DATA:  68 year old female with limited intravenous access in need of long-term IV antibiotics.  EXAM: POWER PICC LINE PLACEMENT WITH ULTRASOUND AND FLUOROSCOPIC GUIDANCE  FLUOROSCOPY TIME:  42 seconds  PROCEDURE: The patient was advised of the possible risks and complications and agreed to undergo the procedure. The patient was then brought to the angiographic suite for the procedure.  The right arm was prepped with chlorhexidine, draped in the usual sterile fashion using maximum barrier technique (cap and mask, sterile gown, sterile gloves,  large sterile sheet, hand hygiene and cutaneous antisepsis) and infiltrated locally with 1% Lidocaine.  Ultrasound demonstrated patency of the right brachial vein, and this was documented with an image. Under real-time ultrasound guidance, this vein was accessed with a 21 gauge micropuncture needle and image documentation was performed. A 0.018 wire was introduced in to the vein. Over this, a 5 [place] dual lumen power injectable PICC was advanced to the lower SVC/right atrial junction. Fluoroscopy during the procedure and fluoro spot radiograph confirms appropriate catheter position. The catheter was flushed and covered with a sterile dressing.  Catheter length: 37 cm  Complications: None  IMPRESSION: Successful right arm power PICC line placement with ultrasound and fluoroscopic guidance. The catheter is ready for use.  Signed,  Criselda Peaches, MD  [facility]  [facility]   Electronically Signed   By: Jacqulynn Cadet M.D.   On: 06/30/2014 17:09   Ir Fluoro Guide Cv Midline Picc Right  06/30/2014   CLINICAL DATA:  68 year old female with limited intravenous access in need of long-term IV antibiotics.  EXAM: POWER PICC LINE PLACEMENT WITH ULTRASOUND AND FLUOROSCOPIC GUIDANCE  FLUOROSCOPY TIME:  42 seconds  PROCEDURE: The patient was advised of the possible risks and complications and agreed to undergo the procedure. The patient was then brought to the angiographic suite for the procedure.  The right arm was prepped with chlorhexidine, draped in the usual  sterile fashion using maximum barrier technique (cap and mask, sterile gown, sterile gloves, large sterile sheet, hand hygiene and cutaneous antisepsis) and infiltrated locally with 1% Lidocaine.  Ultrasound demonstrated patency of the right brachial vein, and this was documented with an image. Under real-time ultrasound guidance, this vein was accessed with a 21 gauge micropuncture needle and image  documentation was performed. A 0.018 wire was introduced in to the vein. Over this, a 5 [place] dual lumen power injectable PICC was advanced to the lower SVC/right atrial junction. Fluoroscopy during the procedure and fluoro spot radiograph confirms appropriate catheter position. The catheter was flushed and covered with a sterile dressing.  Catheter length: 37 cm  Complications: None  IMPRESSION: Successful right arm power PICC line placement with ultrasound and fluoroscopic guidance. The catheter is ready for use.  Signed,  Criselda Peaches, MD  [facility]  [facility]   Electronically Signed   By: Jacqulynn Cadet M.D.   On: 06/30/2014 17:09    Anti-infectives: Anti-infectives   Start     Dose/Rate Route Frequency Ordered Stop   06/29/14 1800  clindamycin (CLEOCIN) IVPB 900 mg     900 mg 100 mL/hr over 30 Minutes Intravenous  Once 06/29/14 1713 06/29/14 1910   06/29/14 1713  gentamicin (GARAMYCIN) 320 mg in dextrose 5 % 100 mL IVPB  Status:  Discontinued     5 mg/kg  64.4 kg (Order-Specific) 108 mL/hr over 60 Minutes Intravenous 60 min pre-op 06/29/14 1713 06/29/14 1722   06/29/14 0945  clindamycin (CLEOCIN) IVPB 900 mg     900 mg 100 mL/hr over 30 Minutes Intravenous  Once 06/29/14 0932 06/29/14 1007   06/29/14 0945  gentamicin (GARAMYCIN) 300 mg in dextrose 5 % 100 mL IVPB  Status:  Discontinued     300 mg 107.5 mL/hr over 60 Minutes Intravenous  Once 06/29/14 0932 06/29/14 0941   06/29/14 0941  gentamicin (GARAMYCIN) 320 mg in dextrose 5 % 100 mL IVPB     5 mg/kg  64.4 kg (Order-Specific) 108 mL/hr over 60 Minutes Intravenous 60 min pre-op 06/29/14 0942 06/29/14 1100      Assessment/Plan: s/p Procedure(s): LAPAROSCOPIC ASSISTED HARTMAN REVERSAL, LYSIS OF ADHESIONS, LEFT COLECTOMY, APPLICATION OF WOUND VAC (N/A)  GI - keep on clears for now; cont entereg F/E/N - decrease MIVF rate, clears, repeat labs in am VTE proph - cont  lovenox, scds Cards - BP ok, on home lopressor and imdur; will have pharmacy restart coumadin tonight Heme - Acute blood loss anemia, repeat cbc in am Pulm - pulm toilet, no issues  [name]. [name], MD, FACS General, Bariatric, & Minimally Invasive Surgery [facility], [place]   LOS: 4 days    Gayland Curry 07/02/2014

## 2014-07-02 NOTE — Progress Notes (Signed)
PT Cancellation Note  Patient Details Name: [name] MRN: [id number] DOB: [birth year]   Cancelled Treatment:    Reason Eval/Treat Not Completed: Fatigue/lethargy limiting ability to participate (has walked already, just back in bed)   Claretha Cooper 07/02/2014, 3:42 PM
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
## 2014-07-03 LAB — PROTIME-INR
INR: 1.23 (ref 0.00–1.49)
Prothrombin Time: 15.5 seconds — ABNORMAL HIGH (ref 11.6–15.2)

## 2014-07-03 LAB — BASIC METABOLIC PANEL
Anion gap: 9 (ref 5–15)
BUN: 4 mg/dL — ABNORMAL LOW (ref 6–23)
CO2: 30 mEq/L (ref 19–32)
Calcium: 8.7 mg/dL (ref 8.4–10.5)
Chloride: 97 mEq/L (ref 96–112)
Creatinine, Ser: 0.48 mg/dL — ABNORMAL LOW (ref 0.50–1.10)
GFR calc Af Amer: >90 mL/min (ref >90)
GFR calc non Af Amer: >90 mL/min (ref >90)
Glucose, Bld: 103 mg/dL — ABNORMAL HIGH (ref 70–99)
Potassium: 4.1 mEq/L (ref 3.7–5.3)
Sodium: 136 mEq/L — ABNORMAL LOW (ref 137–147)

## 2014-07-03 LAB — CBC
HCT: 28.2 % — ABNORMAL LOW (ref 36.0–46.0)
Hemoglobin: 9.4 g/dL — ABNORMAL LOW (ref 12.0–15.0)
MCH: 30.2 pg (ref 26.0–34.0)
MCHC: 33.3 g/dL (ref 30.0–36.0)
MCV: 90.7 fL (ref 78.0–100.0)
Platelets: 231 10*3/uL (ref 150–400)
RBC: 3.11 MIL/uL — ABNORMAL LOW (ref 3.87–5.11)
RDW: 14.4 % (ref 11.5–15.5)
WBC: 6.9 10*3/uL (ref 4.0–10.5)

## 2014-07-03 LAB — MAGNESIUM: Magnesium: 1.9 mg/dL (ref 1.5–2.5)

## 2014-07-03 MED ORDER — MORPHINE SULFATE (PF) 1 MG/ML IV SOLN
INTRAVENOUS | Status: DC
  Administered 2014-07-03: 6.49 mg via INTRAVENOUS
  Administered 2014-07-04: 6.98 mg via INTRAVENOUS
  Administered 2014-07-04: 06:00:00 via INTRAVENOUS
  Administered 2014-07-04: 6 mg via INTRAVENOUS
  Administered 2014-07-04: 1 mg via INTRAVENOUS
  Filled 2014-07-03: qty 25

## 2014-07-03 MED ORDER — LISINOPRIL 10 MG PO TABS
10.0000 mg | ORAL_TABLET | Freq: Two times a day (BID) | ORAL | Status: DC
  Administered 2014-07-03 – 2014-07-04 (×3): 10 mg via ORAL
  Filled 2014-07-03 (×5): qty 1

## 2014-07-03 MED ORDER — WARFARIN SODIUM 4 MG PO TABS
4.0000 mg | ORAL_TABLET | Freq: Once | ORAL | Status: AC
  Administered 2014-07-03: 4 mg via ORAL
  Filled 2014-07-03: qty 1

## 2014-07-03 MED ORDER — NALOXONE HCL 0.4 MG/ML IJ SOLN
0.4000 mg | INTRAMUSCULAR | Status: DC | PRN
Start: 2014-07-03 — End: 2014-07-04

## 2014-07-03 MED ORDER — OXYCODONE-ACETAMINOPHEN 5-325 MG PO TABS
1.0000 | ORAL_TABLET | ORAL | Status: DC | PRN
  Administered 2014-07-04: 1 via ORAL
  Administered 2014-07-04: 2 via ORAL
  Administered 2014-07-04 (×3): 1 via ORAL
  Administered 2014-07-05 (×2): 2 via ORAL
  Filled 2014-07-03 (×2): qty 1
  Filled 2014-07-03 (×2): qty 2
  Filled 2014-07-03: qty 1
  Filled 2014-07-03 (×2): qty 2

## 2014-07-03 MED ORDER — DIPHENHYDRAMINE HCL 12.5 MG/5ML PO ELIX: 12.5000 mg | ORAL_SOLUTION | Freq: Four times a day (QID) | ORAL | Status: DC | PRN

## 2014-07-03 MED ORDER — SODIUM CHLORIDE 0.9 % IJ SOLN: 9.0000 mL | INTRAMUSCULAR | Status: DC | PRN

## 2014-07-03 MED ORDER — DIPHENHYDRAMINE HCL 50 MG/ML IJ SOLN: 12.5000 mg | Freq: Four times a day (QID) | INTRAMUSCULAR | Status: DC | PRN

## 2014-07-03 MED ORDER — ONDANSETRON HCL 4 MG/2ML IJ SOLN: 4.0000 mg | Freq: Four times a day (QID) | INTRAMUSCULAR | Status: DC | PRN
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
## 2014-07-03 NOTE — Progress Notes (Signed)
4 Days Post-Op  Subjective: Pt seen early this am and just reviewed chart and spoke with nurse.  Pt had 2bms late this pm. No n/v per nurse.   Objective: Vital signs in last 24 hours: Temp:  [98 F (36.7 C)-98.2 F (36.8 C)] 98.2 F (36.8 C) (08/21 1421) Pulse Rate:  [61-72] 63 (08/21 1421) Resp:  [12-22] 17 (08/21 1558) BP: (128-151)/(50-66) 151/64 mmHg (08/21 1421) SpO2:  [95 %-100 %] 96 % (08/21 1558) Weight:  [142 lb 3.2 oz (64.501 kg)] 142 lb 3.2 oz (64.501 kg) (08/21 0429) Last BM Date: 07/03/14  Intake/Output from previous day: 08/20 0701 - 08/21 0700 In: 1955.8 [I.V.:1955.8] Out: 4750 [Urine:4750] Intake/Output this shift: Total I/O In: 1440 [P.O.:840; I.V.:600] Out: 2200 [Urine:2200]  Alert, nad cta b/l Reg Soft, some distension, +wound vac, some BS. approp TTP No edema  Lab Results:   Recent Labs  07/01/14 0419 07/03/14 0500  WBC 12.5* 6.9  HGB 9.6* 9.4*  HCT 28.1* 28.2*  PLT 221 231   BMET  Recent Labs  07/01/14 0419 07/03/14 0500  NA 136* 136*  K 4.4 4.1  CL 100 97  CO2 27 30  GLUCOSE 93 103*  BUN 7 4*  CREATININE 0.50 0.48*  CALCIUM 8.2* 8.7   PT/INR  Recent Labs  07/03/14 0950  LABPROT 15.5*  INR 1.23   ABG No results found for this basename: PHART, PCO2, PO2, HCO3,  in the last 72 hours  Studies/Results: No results found.  Anti-infectives: Anti-infectives   Start     Dose/Rate Route Frequency Ordered Stop   06/29/14 1800  clindamycin (CLEOCIN) IVPB 900 mg     900 mg 100 mL/hr over 30 Minutes Intravenous  Once 06/29/14 1713 06/29/14 1910   06/29/14 1713  gentamicin (GARAMYCIN) 320 mg in dextrose 5 % 100 mL IVPB  Status:  Discontinued     5 mg/kg  64.4 kg (Order-Specific) 108 mL/hr over 60 Minutes Intravenous 60 min pre-op 06/29/14 1713 06/29/14 1722   06/29/14 0945  clindamycin (CLEOCIN) IVPB 900 mg     900 mg 100 mL/hr over 30 Minutes Intravenous  Once 06/29/14 0932 06/29/14 1007   06/29/14 0945  gentamicin  (GARAMYCIN) 300 mg in dextrose 5 % 100 mL IVPB  Status:  Discontinued     300 mg 107.5 mL/hr over 60 Minutes Intravenous  Once 06/29/14 0932 06/29/14 0941   06/29/14 0941  gentamicin (GARAMYCIN) 320 mg in dextrose 5 % 100 mL IVPB     5 mg/kg  64.4 kg (Order-Specific) 108 mL/hr over 60 Minutes Intravenous 60 min pre-op 06/29/14 0942 06/29/14 1100      Assessment/Plan: s/p Procedure(s): LAPAROSCOPIC ASSISTED HARTMAN REVERSAL, LYSIS OF ADHESIONS, LEFT COLECTOMY, APPLICATION OF WOUND VAC (N/A)  Updated plan this pm since pt has had BMs: -Adv to full liquids tonight Change pca to reduced dose (pt wants to make sure IV pain meds are weaned and not abruptly stopped) Po pain meds -CV - cont home BP meds, restart ACEi, sinus rhythm -Anticoagulation - restarted coumadin for h/o afib; pharm dosing -VTE prophylaxis - scds, lovenox,  -F/e/n - if po good overnight, can probably KVO mIVF in am; lytes ok. fulls -pt requests her O2 stays on.   [name]. [name], MD, FACS General, Bariatric, & Minimally Invasive Surgery [facility], [place]   LOS: 5 days    Gayland Curry 07/03/2014

## 2014-07-03 NOTE — Consult Note (Signed)
WOC wound consult note Reason for Consult: VAC dressing change.  Bridging two wounds, midline abdominal and ostomy takedown site together under NPWT. Wound type: Surgical wounds.  Wound bed: 100% beefy red with moderate, serosanguinous drainage.   Periwound:Intact.  Skin is very thin and tender from Coumadin therapy, per patient.  Pain medication administered prior to the start and patient still tender during dressing change.  Dressing procedure/placement/frequency:1 piece Black foam used to pack midline abdominal wound and 1 piece for ostomy takedown site.  Drape placed between the two wounds and 1 piece black foam used to bridge the two together. Seal was obtained without difficulty.  [place] team will continue to follow this patient and assess if bedside nurse able to complete next dressing change.  Domenic Moras RN BSN [place] Pager [phone]

## 2014-07-03 NOTE — Progress Notes (Signed)
Physical Therapy Treatment Patient Details Name: [name] MRN: [id number] DOB: 09-28-1946 Today's Date: 07/03/2014    History of Present Illness Patient status post colostomy takedown and lysis of adhesions, wound VAC  06/29/14    PT Comments    Pt eager to walk.  Assisted OOB required increased time due to ABD "pulling" and multiple lines/leads/tubes. Pt tolerated amb greater distance but required 3 standing rest breaks to maintain therapeutic O2 sats levels.  Instructed on deep breathing with activity.  Returned pt to room back to bed per pt request.    Follow Up Recommendations  No PT follow up     Equipment Recommendations  None recommended by PT    Recommendations for Other Services       Precautions / Restrictions Precautions Precaution Comments: multiple lines,wound VAC monitor O2 Restrictions Weight Bearing Restrictions: No    Mobility  Bed Mobility Overal bed mobility: Needs Assistance Bed Mobility: Supine to Sit;Sit to Supine     Supine to sit: Supervision Sit to supine: Supervision   General bed mobility comments: increased time   Transfers Overall transfer level: Needs assistance Equipment used: Rolling walker (2 wheeled) Transfers: Sit to/from Stand Sit to Stand: Min guard;Min assist         General transfer comment: good use of hands and increased time  Ambulation/Gait Ambulation/Gait assistance: Min assist;Min guard Ambulation Distance (Feet): 500 Feet Assistive device: Rolling walker (2 wheeled) Gait Pattern/deviations: Step-through pattern;Decreased stride length;Trunk flexed Gait velocity: decreased   General Gait Details: amb on 2 lts O2 sats ranged from 86 - 98% HR 78.  Amb with RW for increased stability and to increase amb distance however believe pt will not need one for D/C to home.   Stairs            Wheelchair Mobility    Modified Rankin (Stroke Patients Only)       Balance                                     Cognition                            Exercises      General Comments        Pertinent Vitals/Pain      Home Living                      Prior Function            PT Goals (current goals can now be found in the care plan section) Progress towards PT goals: Progressing toward goals    Frequency       PT Plan      Co-evaluation             End of Session Equipment Utilized During Treatment: Gait belt Activity Tolerance: Patient tolerated treatment well Patient left: in bed;with call bell/phone within reach     Time: 1346-1410 PT Time Calculation (min): 24 min  Charges:  $Gait Training: 8-22 mins $Therapeutic Activity: 8-22 mins                    G Codes:      Rica Koyanagi  PTA [facility]  Acute  Rehab Pager      [phone]

## 2014-07-04 LAB — PROTIME-INR
INR: 1.59 — ABNORMAL HIGH (ref 0.00–1.49)
Prothrombin Time: 19 seconds — ABNORMAL HIGH (ref 11.6–15.2)

## 2014-07-04 MED ORDER — WARFARIN SODIUM 2.5 MG PO TABS
2.5000 mg | ORAL_TABLET | Freq: Once | ORAL | Status: AC
  Administered 2014-07-04: 2.5 mg via ORAL
  Filled 2014-07-04: qty 1

## 2014-07-04 MED ORDER — OXYCODONE-ACETAMINOPHEN 5-325 MG PO TABS: 1.0000 | ORAL_TABLET | ORAL | Status: DC | PRN

## 2014-07-04 MED ORDER — MORPHINE SULFATE 2 MG/ML IJ SOLN
1.0000 mg | INTRAMUSCULAR | Status: DC | PRN
  Administered 2014-07-06 (×2): 2 mg via INTRAVENOUS
  Filled 2014-07-04 (×2): qty 1

## 2014-07-04 NOTE — Progress Notes (Signed)
General Surgery Note  LOS: 6 days  POD -  5 Days Post-Op  Assessment/Plan: 1.  LAPAROSCOPIC ASSISTED HARTMAN REVERSAL, LYSIS OF ADHESIONS, LEFT COLECTOMY, APPLICATION OF WOUND VAC - 06/29/2014 - E. [facility]  History of perf sigmoid diverticulitis - 12/28/2013  On full liquids.  Will stop PCA and give oral pain meds. 2.  VAC wound 3.  HTN 4.  CAD with history of CABG 5.  A. Fib 6.  Has been on anti coagulation  Being redosed by pharmacy  PT/INR - 19.0/1.59  7.  DVT prophylaxis - Lovenox 8.  Anemia  Hgb -9.4 - 07/04/2014   Active Problems:   Colostomy in place   Subjective:  Doing well.  Had BM.  Feeling better. Objective:   Filed Vitals:   07/04/14 0541  BP: 134/54  Pulse: 63  Temp: 97.9 F (36.6 C)  Resp: 17     Intake/Output from previous day:  08/21 0701 - 08/22 0700 In: 2664.5 [P.O.:840; I.V.:1824.5] Out: 3450 [Urine:3450]  Intake/Output this shift:      Physical Exam:   General: WN older WF who is alert and oriented.    HEENT: Normal. Pupils equal. .   Lungs: Clear   Abdomen: Soft.  Has BS.   Wound: VAC   Lab Results:    Recent Labs  07/03/14 0500  WBC 6.9  HGB 9.4*  HCT 28.2*  PLT 231    BMET   Recent Labs  07/03/14 0500  NA 136*  K 4.1  CL 97  CO2 30  GLUCOSE 103*  BUN 4*  CREATININE 0.48*  CALCIUM 8.7    PT/INR   Recent Labs  07/03/14 0950 07/04/14 0530  LABPROT 15.5* 19.0*  INR 1.23 1.59*    ABG  No results found for this basename: PHART, PCO2, PO2, HCO3,  in the last 72 hours   Studies/Results:  No results found.   Anti-infectives:   Anti-infectives   Start     Dose/Rate Route Frequency Ordered Stop   06/29/14 1800  clindamycin (CLEOCIN) IVPB 900 mg     900 mg 100 mL/hr over 30 Minutes Intravenous  Once 06/29/14 1713 06/29/14 1910   06/29/14 1713  gentamicin (GARAMYCIN) 320 mg in dextrose 5 % 100 mL IVPB  Status:  Discontinued     5 mg/kg  64.4 kg (Order-Specific) 108 mL/hr over 60 Minutes Intravenous 60 min  pre-op 06/29/14 1713 06/29/14 1722   06/29/14 0945  clindamycin (CLEOCIN) IVPB 900 mg     900 mg 100 mL/hr over 30 Minutes Intravenous  Once 06/29/14 0932 06/29/14 1007   06/29/14 0945  gentamicin (GARAMYCIN) 300 mg in dextrose 5 % 100 mL IVPB  Status:  Discontinued     300 mg 107.5 mL/hr over 60 Minutes Intravenous  Once 06/29/14 0932 06/29/14 0941   06/29/14 0941  gentamicin (GARAMYCIN) 320 mg in dextrose 5 % 100 mL IVPB     5 mg/kg  64.4 kg (Order-Specific) 108 mL/hr over 60 Minutes Intravenous 60 min pre-op 06/29/14 0942 06/29/14 1100      Alphonsa Overall, MD, FACS Pager: [facility] Office: [phone] 07/04/2014
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
## 2014-07-05 ENCOUNTER — Encounter (HOSPITAL_COMMUNITY): Payer: Self-pay | Admitting: Surgery

## 2014-07-05 DIAGNOSIS — I959 Hypotension, unspecified: Secondary | ICD-10-CM

## 2014-07-05 DIAGNOSIS — M51379 Other intervertebral disc degeneration, lumbosacral region without mention of lumbar back pain or lower extremity pain: Secondary | ICD-10-CM | POA: Diagnosis present

## 2014-07-05 DIAGNOSIS — K5790 Diverticulosis of intestine, part unspecified, without perforation or abscess without bleeding: Secondary | ICD-10-CM | POA: Diagnosis present

## 2014-07-05 DIAGNOSIS — L309 Dermatitis, unspecified: Secondary | ICD-10-CM | POA: Diagnosis present

## 2014-07-05 DIAGNOSIS — K5720 Diverticulitis of large intestine with perforation and abscess without bleeding: Secondary | ICD-10-CM | POA: Diagnosis present

## 2014-07-05 DIAGNOSIS — M3500 Sicca syndrome, unspecified: Secondary | ICD-10-CM | POA: Diagnosis present

## 2014-07-05 DIAGNOSIS — I4891 Unspecified atrial fibrillation: Secondary | ICD-10-CM

## 2014-07-05 DIAGNOSIS — M5137 Other intervertebral disc degeneration, lumbosacral region: Secondary | ICD-10-CM | POA: Diagnosis present

## 2014-07-05 DIAGNOSIS — F419 Anxiety disorder, unspecified: Secondary | ICD-10-CM | POA: Diagnosis present

## 2014-07-05 LAB — PROTIME-INR
INR: 1.84 — ABNORMAL HIGH (ref 0.00–1.49)
Prothrombin Time: 21.3 seconds — ABNORMAL HIGH (ref 11.6–15.2)

## 2014-07-05 MED ORDER — LIP MEDEX EX OINT
1.0000 "application " | TOPICAL_OINTMENT | Freq: Two times a day (BID) | CUTANEOUS | Status: DC
  Administered 2014-07-05 (×2): 1 via TOPICAL
  Filled 2014-07-05: qty 7

## 2014-07-05 MED ORDER — LISINOPRIL 5 MG PO TABS
5.0000 mg | ORAL_TABLET | Freq: Two times a day (BID) | ORAL | Status: DC
  Administered 2014-07-05 – 2014-07-06 (×2): 5 mg via ORAL
  Filled 2014-07-05 (×3): qty 1

## 2014-07-05 MED ORDER — ACETAMINOPHEN 500 MG PO TABS
1000.0000 mg | ORAL_TABLET | Freq: Three times a day (TID) | ORAL | Status: DC
  Administered 2014-07-05 – 2014-07-06 (×3): 1000 mg via ORAL
  Filled 2014-07-05 (×5): qty 2

## 2014-07-05 MED ORDER — ASPIRIN EC 81 MG PO TBEC
81.0000 mg | DELAYED_RELEASE_TABLET | Freq: Every day | ORAL | Status: DC
  Administered 2014-07-05: 81 mg via ORAL
  Filled 2014-07-05 (×2): qty 1

## 2014-07-05 MED ORDER — PHENOL 1.4 % MT LIQD: 2.0000 | OROMUCOSAL | Status: DC | PRN

## 2014-07-05 MED ORDER — MAGIC MOUTHWASH: 15.0000 mL | Freq: Four times a day (QID) | ORAL | Status: DC | PRN

## 2014-07-05 MED ORDER — ALUM & MAG HYDROXIDE-SIMETH 200-200-20 MG/5ML PO SUSP: 30.0000 mL | Freq: Four times a day (QID) | ORAL | Status: DC | PRN

## 2014-07-05 MED ORDER — EZETIMIBE 10 MG PO TABS
10.0000 mg | ORAL_TABLET | Freq: Every evening | ORAL | Status: DC
  Administered 2014-07-05: 10 mg via ORAL
  Filled 2014-07-05 (×2): qty 1

## 2014-07-05 MED ORDER — PANTOPRAZOLE SODIUM 40 MG PO TBEC
40.0000 mg | DELAYED_RELEASE_TABLET | Freq: Every day | ORAL | Status: DC
  Administered 2014-07-05: 40 mg via ORAL
  Filled 2014-07-05 (×2): qty 1

## 2014-07-05 MED ORDER — SACCHAROMYCES BOULARDII 250 MG PO CAPS
250.0000 mg | ORAL_CAPSULE | Freq: Two times a day (BID) | ORAL | Status: DC
  Administered 2014-07-05 – 2014-07-06 (×3): 250 mg via ORAL
  Filled 2014-07-05 (×4): qty 1

## 2014-07-05 MED ORDER — METOPROLOL SUCCINATE 12.5 MG HALF TABLET
12.5000 mg | ORAL_TABLET | Freq: Two times a day (BID) | ORAL | Status: DC
  Administered 2014-07-05 – 2014-07-06 (×3): 12.5 mg via ORAL
  Filled 2014-07-05 (×4): qty 1

## 2014-07-05 MED ORDER — WARFARIN SODIUM 2.5 MG PO TABS
2.5000 mg | ORAL_TABLET | Freq: Once | ORAL | Status: AC
  Administered 2014-07-05: 2.5 mg via ORAL
  Filled 2014-07-05: qty 1

## 2014-07-05 MED ORDER — LACTATED RINGERS IV BOLUS (SEPSIS): 1000.0000 mL | Freq: Three times a day (TID) | INTRAVENOUS | Status: DC | PRN

## 2014-07-05 MED ORDER — VALACYCLOVIR HCL 500 MG PO TABS
2000.0000 mg | ORAL_TABLET | Freq: Every day | ORAL | Status: DC | PRN
  Filled 2014-07-05: qty 4

## 2014-07-05 MED ORDER — VITAMIN B-6 100 MG PO TABS
100.0000 mg | ORAL_TABLET | Freq: Every day | ORAL | Status: DC
  Administered 2014-07-05 – 2014-07-06 (×2): 100 mg via ORAL
  Filled 2014-07-05 (×2): qty 1

## 2014-07-05 MED ORDER — MENTHOL 3 MG MT LOZG: 1.0000 | LOZENGE | OROMUCOSAL | Status: DC | PRN

## 2014-07-05 MED ORDER — SIMVASTATIN 20 MG PO TABS
20.0000 mg | ORAL_TABLET | Freq: Every evening | ORAL | Status: DC
  Administered 2014-07-05: 20 mg via ORAL
  Filled 2014-07-05 (×2): qty 1

## 2014-07-05 MED ORDER — HYDROCORTISONE VALERATE 0.2 % EX CREA
1.0000 "application " | TOPICAL_CREAM | CUTANEOUS | Status: DC
  Filled 2014-07-05: qty 15

## 2014-07-05 MED ORDER — METOPROLOL TARTRATE 1 MG/ML IV SOLN: 5.0000 mg | Freq: Four times a day (QID) | INTRAVENOUS | Status: DC | PRN

## 2014-07-05 MED ORDER — SODIUM CHLORIDE 0.9 % IJ SOLN
3.0000 mL | Freq: Two times a day (BID) | INTRAMUSCULAR | Status: DC
Start: 2014-07-05 — End: 2014-07-06
  Administered 2014-07-05 (×2): 3 mL via INTRAVENOUS

## 2014-07-05 MED ORDER — SODIUM CHLORIDE 0.9 % IJ SOLN: 3.0000 mL | INTRAMUSCULAR | Status: DC | PRN

## 2014-07-05 MED ORDER — OXYCODONE HCL 5 MG PO TABS
5.0000 mg | ORAL_TABLET | ORAL | Status: DC | PRN
  Administered 2014-07-05 (×3): 5 mg via ORAL
  Administered 2014-07-06: 10 mg via ORAL
  Administered 2014-07-06: 5 mg via ORAL
  Administered 2014-07-06: 10 mg via ORAL
  Administered 2014-07-06: 5 mg via ORAL
  Filled 2014-07-05: qty 2
  Filled 2014-07-05 (×2): qty 1
  Filled 2014-07-05: qty 2
  Filled 2014-07-05 (×3): qty 1

## 2014-07-05 NOTE — Progress Notes (Addendum)
[facility]  [address] [place], [place] [address] Phone: [phone] FAX: [phone]    [name] [id number] Jun 21, 1946  CARE TEAM:  PCP: [name], MD  Outpatient Care Team: Patient Care Team: [name], MD as PCP - General (Family Medicine)  Inpatient Treatment Team: Treatment Team: Attending Provider: Gayland Curry, MD; Registered Nurse: Nadara Mode, RN; Registered Nurse: Patton Salles, RN; Registered Nurse: Stacey Drain, RN; Registered Nurse: Atilano Median, RN; Registered Nurse: Jannifer Franklin, RN; Student Nurse: Laurance Flatten, Student-RN; Registered Nurse: Aundra Dubin, RN; Registered Nurse: Blanchard Kelch, RN; Physician Assistant: Clinton Gallant, NT; Technician: Waylan Boga, NT; Registered Nurse: Levie Heritage, RN; Registered Nurse: Hervey Ard, RN   Subjective:  Sore Walking some A little light headed - convinced BP too low Appetite fair - not liking sweet full liquids  Objective:  Vital signs:  Filed Vitals:   07/04/14 1820 07/04/14 2234 07/05/14 0515 07/05/14 0730  BP: 139/72 108/41 108/44   Pulse:  58 56   Temp:  98.4 F (36.9 C) 98 F (36.7 C)   TempSrc:  Oral Oral   Resp:  20 20   Height:      Weight:    139 lb 1.8 oz (63.1 kg)  SpO2:  99% 99%     Last BM Date: 07/04/14  Intake/Output   Yesterday:  08/22 0701 - 08/23 0700 In: 2099.2 [P.O.:840; I.V.:1259.2] Out: 6195 [Urine:3425] This shift:  Total I/O In: -  Out: 550 [Urine:550]  Bowel function:  Flatus: y  BM: y  Drain: wound vac min outpt  Physical Exam:  General: Pt awake/alert/oriented x4 in no acute distress Eyes: PERRL, normal EOM.  Sclera clear.  No icterus Neuro: CN II-XII intact w/o focal sensory/motor deficits. Lymph: No head/neck/groin lymphadenopathy Psych:  No delerium/psychosis/paranoia.  Interrupts often but directable HENT: Normocephalic, Mucus membranes  moist.  No thrush Neck: Supple, No tracheal deviation Chest: No chest wall pain w good excursion CV:  Pulses intact.  Iregular rhythm.  Mild bradycardia MS: Normal AROM mjr joints.  No obvious deformity Abdomen: Soft.  Nondistended.  Wound vac clean.  Mildly tender at incisions only.  No evidence of peritonitis.  No incarcerated hernias. Ext:  SCDs BLE.  No mjr edema.  No cyanosis Skin: No petechiae / purpura   Problem List:   Principal Problem:   Status post colostomy takedown 06/29/2014 Active Problems:   Paroxysmal atrial fibrillation, maintaining SR on coumadin    CAD (coronary artery disease) with CABG   HTN (hypertension)   Back pain   Pulmonary hypertension   Chronic pain syndrome   Diverticulosis   Diverticulitis of colon with perforation s/p Hartmann/colectomy/colostomy Feb 2015   DDD (degenerative disc disease), lumbosacral   Anxiety   Sjogren's disease   Eczema   Assessment  [name]  68 y.o. female  6 Days Post-Op  Procedure(s): POSTOPERATIVE DIAGNOSIS: History of sigmoid diverticulitis status post  Hartmann procedure.   PROCEDURE: Laparoscopic converted to open Hartmann reversal with left  colectomy, lysis of adhesions for 1 hour, application of wound VAC.   SURGEON: [name]. [name], MD, FACS  ASSISTANT SURGEON: Isabel Caprice. Hassell Done, MD.   Improving slowly  Plan:   -adv to solids -d/c IVF - PRN IVF bolus backup -ortho VS -cut metoprolol in 1/2 w PRN backup -probiotic for irreg BMs (expected this early) -anticoagulation INR 1.8.  Cont bridge.  Prob wean off to  warfarin only tomorrow -VTE prophylaxis- SCDs, etc -mobilize as tolerated to help recovery -eczema - restart med -chronic pain - tylenol, ice, heat, oxycodone  D/C patient from hospital when patient meets criteria (anticipate in 1-2 day(s)):  Tolerating oral intake well Ambulating in walkways Adequate pain control without IV medications Urinating  Having flatus  I updated the  patient's status to the patient.  Recommendations were made.  Questions were answered.  Tended to interrupt a lot & question everything, but eventually the patient expressed understanding & appreciation.    Adin Hector, M.D., F.A.C.S. Gastrointestinal and Minimally Invasive Surgery [facility] [address] [place], [place] [address] [phone] Main / Paging   07/05/2014   Results:   Labs: Results for orders placed during the hospital encounter of 06/28/14 (from the past 48 hour(s))  PROTIME-INR     Status: Abnormal   Collection Time    07/04/14  5:30 AM      Result Value Ref Range   Prothrombin Time 19.0 (*) 11.6 - 15.2 seconds   INR 1.59 (*) 0.00 - 1.49  PROTIME-INR     Status: Abnormal   Collection Time    07/05/14  5:00 AM      Result Value Ref Range   Prothrombin Time 21.3 (*) 11.6 - 15.2 seconds   INR 1.84 (*) 0.00 - 1.49    Imaging / Studies: No results found.  Medications / Allergies: per chart  Antibiotics: Anti-infectives   Start     Dose/Rate Route Frequency Ordered Stop   06/29/14 1800  clindamycin (CLEOCIN) IVPB 900 mg     900 mg 100 mL/hr over 30 Minutes Intravenous  Once 06/29/14 1713 06/29/14 1910   06/29/14 1713  gentamicin (GARAMYCIN) 320 mg in dextrose 5 % 100 mL IVPB  Status:  Discontinued     5 mg/kg  64.4 kg (Order-Specific) 108 mL/hr over 60 Minutes Intravenous 60 min pre-op 06/29/14 1713 06/29/14 1722   06/29/14 0945  clindamycin (CLEOCIN) IVPB 900 mg     900 mg 100 mL/hr over 30 Minutes Intravenous  Once 06/29/14 0932 06/29/14 1007   06/29/14 0945  gentamicin (GARAMYCIN) 300 mg in dextrose 5 % 100 mL IVPB  Status:  Discontinued     300 mg 107.5 mL/hr over 60 Minutes Intravenous  Once 06/29/14 0932 06/29/14 0941   06/29/14 0941  gentamicin (GARAMYCIN) 320 mg in dextrose 5 % 100 mL IVPB     5 mg/kg  64.4 kg (Order-Specific) 108 mL/hr over 60 Minutes Intravenous 60 min pre-op 06/29/14 0940 06/29/14  1100       Note: Portions of this report may have been transcribed using voice recognition software. Every effort was made to ensure accuracy; however, inadvertent computerized transcription errors may be present.   Any transcriptional errors that result from this process are unintentional.
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
## 2014-07-06 ENCOUNTER — Telehealth: Payer: Self-pay | Admitting: Pharmacist Clinician (PhC)/ Clinical Pharmacy Specialist

## 2014-07-06 LAB — CBC
HCT: 28.5 % — ABNORMAL LOW (ref 36.0–46.0)
Hemoglobin: 9.5 g/dL — ABNORMAL LOW (ref 12.0–15.0)
MCH: 29.9 pg (ref 26.0–34.0)
MCHC: 33.3 g/dL (ref 30.0–36.0)
MCV: 89.6 fL (ref 78.0–100.0)
Platelets: 277 10*3/uL (ref 150–400)
RBC: 3.18 MIL/uL — ABNORMAL LOW (ref 3.87–5.11)
RDW: 13.9 % (ref 11.5–15.5)
WBC: 7 10*3/uL (ref 4.0–10.5)

## 2014-07-06 LAB — BASIC METABOLIC PANEL
Anion gap: 11 (ref 5–15)
BUN: 6 mg/dL (ref 6–23)
CO2: 28 mEq/L (ref 19–32)
Calcium: 8.7 mg/dL (ref 8.4–10.5)
Chloride: 99 mEq/L (ref 96–112)
Creatinine, Ser: 0.5 mg/dL (ref 0.50–1.10)
GFR calc Af Amer: >90 mL/min (ref >90)
GFR calc non Af Amer: >90 mL/min (ref >90)
Glucose, Bld: 99 mg/dL (ref 70–99)
Potassium: 3.8 mEq/L (ref 3.7–5.3)
Sodium: 138 mEq/L (ref 137–147)

## 2014-07-06 LAB — PROTIME-INR
INR: 2.06 — ABNORMAL HIGH (ref 0.00–1.49)
Prothrombin Time: 23.2 seconds — ABNORMAL HIGH (ref 11.6–15.2)

## 2014-07-06 MED ORDER — OXYCODONE-ACETAMINOPHEN 5-325 MG PO TABS: 1.0000 | ORAL_TABLET | ORAL | Status: DC | PRN

## 2014-07-06 MED ORDER — WARFARIN SODIUM 2.5 MG PO TABS
2.5000 mg | ORAL_TABLET | Freq: Once | ORAL | Status: DC
  Filled 2014-07-06: qty 1

## 2014-07-06 NOTE — Discharge Instructions (Signed)
[facility], [place] [phone]  OPEN ABDOMINAL SURGERY: POST OP INSTRUCTIONS  Always review your discharge instruction sheet given to you by the facility where your surgery was performed.  IF YOU HAVE DISABILITY OR FAMILY LEAVE FORMS, YOU MUST BRING THEM TO THE OFFICE FOR PROCESSING.  PLEASE DO NOT GIVE THEM TO YOUR DOCTOR.  1. A prescription for pain medication may be given to you upon discharge.  Take your pain medication as prescribed, if needed.  If narcotic pain medicine is not needed, then you may take acetaminophen (Tylenol) or ibuprofen (Advil) as needed. 2. Take your usually prescribed medications unless otherwise directed. 3. If you need a refill on your pain medication, please contact your pharmacy. They will contact our office to request authorization.  Prescriptions will not be filled after 5pm or on week-ends. 4. You should follow a light diet the first few days after arrival home, such as soup and crackers, pudding, etc.unless your doctor has advised otherwise. A high-fiber, low fat diet can be resumed as tolerated.   Be sure to include lots of fluids daily. Most patients will experience some swelling and bruising on the chest and neck area.  Ice packs will help.  Swelling and bruising can take several days to resolve 5. Most patients will experience some swelling and bruising in the area of the incision. Ice pack will help. Swelling and bruising can take several days to resolve..  6. It is common to experience some constipation if taking pain medication after surgery.  Increasing fluid intake and taking a stool softener will usually help or prevent this problem from occurring.  A mild laxative (Milk of Magnesia or Miralax) should be taken according to package directions if there are no bowel movements after 48 hours. 7. You may shower prior to your wound vac change. Disconnect tubing from vacuum device. 8. ACTIVITIES:  You may resume regular (light) daily  activities beginning the next day--such as daily self-care, walking, climbing stairs--gradually increasing activities as tolerated.  You may have sexual intercourse when it is comfortable.  Refrain from any heavy lifting or straining until approved by your doctor. a. You may drive when you no longer are taking prescription pain medication, you can comfortably wear a seatbelt, and you can safely maneuver your car and apply brakes b. Return to Work: ___________________________________ 45. You should see your doctor in the office for a follow-up appointment approximately two weeks after your surgery.  Make sure that you call for this appointment within a day or two after you arrive home to insure a convenient appointment time. OTHER INSTRUCTIONS: FOLLOW UP WITH COUMADIN CLINIC ([facility]) TO CHECK YOUR INR LATER THIS WEEK WHEN TO CALL YOUR DOCTOR: 1. Fever over 101.0 2. Inability to urinate 3. Nausea and/or vomiting 4. Extreme swelling or bruising 5. Continued bleeding from incision. 6. Increased pain, redness, or drainage from the incision. 7. Difficulty swallowing or breathing 8. Muscle cramping or spasms. 9. Numbness or tingling in hands or feet or around lips.  The clinic staff is available to answer your questions during regular business hours.  Please dont hesitate to call and ask to speak to one of the nurses if you have concerns.  For further questions, please visit [email or web address]  Vacuum-Assisted Closure Therapy Vacuum-assisted closure (VAC) therapy uses a device that removes fluid and germs from wounds to help them heal. It is used on wounds that cannot be closed with stitches. They often heal slowly. Vacuum-assisted therapy helps  the wound stay clean and healthy while the open wound slowly grows back together. Vacuum-assisted closure therapy uses a bandage (dressing) that is made of foam. It is put inside the wound. Then, a drape is placed over the wound. This drape sticks  to your skin to keep air out, and to protect the wound. A tube is hooked up to a small pump and is attached to the drape. The pump sucks out the fluid and germs. Vacuum-assisted closure therapy can also help reduce the bad smell that comes from the wound. HOW DOES IT WORK?  The vacuum pump pulls fluid through the foam dressing. The dressing may wrinkle during this process. The fluid goes into the tube and away from the wound. The fluid then goes into a container. The fluid in the container must be replaced if it is full or at least once a week, even if the container is not full. The pulling from the pump helps to close the wound and bring better circulation to the wound area. The foam dressing covers and protects the wound. It helps your wound heal faster.  HOW DOES IT FEEL?   You might feel a little pulling when the pump is on.  You might also feel a mild vibrating sensation.  You might feel some discomfort when the dressing is taken off. CAN I MOVE AROUND WITH VACUUM-ASSISTED CLOSURE THERAPY? Yes, it has a backup battery which is used when the machine is not plugged in, as long as the battery is working, you can move freely. WHAT ARE SOME THINGS I MUST KNOW?  Do not turn off the pump yourself, unless instructed to do so by your healthcare provider, such as for bathing.  Do not take off the dressing yourself, unless instructed to do so by your caregiver.  You can wash or shower with the dressing. However, do not take the pump into the shower. Make sure the wound dressing is protected and covered with plastic. The wound area must stay dry.  Do not turn off the pump for more than 2 hours. If the pump is off for more than 2 hours, your nurse must change your dressing.  Check frequently that the machine is on, that the machine indicates the therapy is on, and that all clamps are open. THE ALARM IS SOUNDING! WHAT SHOULD I DO?   Stay calm.  Do not turn off the pump or do anything with the  dressing.  Call your clinic or caregiver right away if the alarm goes off and you cannot fix the problem. Some reasons the alarm might go off include:  The fluid collection container is full.  The battery is low.  The dressing has a leak.  Explain to your caregiver what is happening. Follow the instructions you receive. WHEN SHOULD I CALL FOR HELP?   You have severe pain.  You have difficulty breathing.  You have bleeding that will not stop.  Your wound smells bad.  You have redness, swelling, or fluid leaking from your wound.  Your alarm goes off and you do not know what to do.  You have a fever.  Your wound itches severely.  Your dressing changes are often painful or bleeding often occurs.  You have diarrhea.  You have a sore throat.  You have a rash around the dressing or anywhere else on your body.  You feel nauseous.  You feel dizzy or weak.  The [facility] machine has been off for more than 2 hours. HOW DO  I GET READY TO GO HOME WITH A PUMP?  A trained caregiver will talk to you and answer your questions about your vacuum-assisted closure therapy before you go home. He or she will explain what to expect. A caregiver will come to your home to apply the pump and care for your wound. The at-home caregiver will be available for questions and will come back for the scheduled dressing changes, usually every 48-72 hours (or more often for severely infected wounds). Your at-home caregiver will also come if you are having an unexpected problem. If you have questions or do not know what to do when you go home, talk to your healthcare provider. Document Released: 10/12/2008 Document Revised: 07/02/2013 Document Reviewed: 10/13/2011 [facility] Patient Information 2015 [place], [place]. This information is not intended to replace advice given to you by your health care provider. Make sure you discuss any questions you have with your health care provider.
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
## 2014-07-06 NOTE — Discharge Summary (Signed)
Physician Discharge Summary  [name]:[id number] DOB: 02-17-1946 DOA: 06/28/2014  PCP: [name], MD  Admit date: 06/28/2014 Discharge date: 07/06/2014  Recommendations for Outpatient Follow-up:  1. Home Health Nursing for wound vac dressing change  Follow-up Information   Follow up with Tommy Medal, RPH-CPP. Schedule an appointment as soon as possible for a visit in 3 days. (for INR check)    Contact information:   [place] [address] [phone]       Follow up with Gayland Curry, MD. Schedule an appointment as soon as possible for a visit in 2 weeks. (For wound re-check)    Specialty:  General Surgery   Contact information:   [place]  [address] [phone]      Discharge Diagnoses:  Principal Problem:   Status post colostomy takedown 06/29/2014 Active Problems:   Paroxysmal atrial fibrillation, maintaining SR on coumadin    CAD (coronary artery disease) with CABG   HTN (hypertension)   Back pain   Pulmonary hypertension   Chronic pain syndrome   Diverticulosis   Diverticulitis of colon with perforation s/p Hartmann/colectomy/colostomy Feb 2015   DDD (degenerative disc disease), lumbosacral   Anxiety   Sjogren's disease   Eczema   Surgical Procedure: Exploratory Laparotomy, LOA, colostomy takedown with completion L colectomy, placement of wound vac  Discharge Condition: good Disposition: home   Diet recommendation: cardiac fit  Filed Weights   07/04/14 0541 07/05/14 0730 07/06/14 0701  Weight: 138 lb 3.7 oz (62.7 kg) 139 lb 1.8 oz (63.1 kg) 136 lb 8 oz (61.916 kg)    Hospital Course:  Pt was admitted for planned takedown of her end colostomy which also required some additional colonic resection. She was maintained on perioperative chemical VTE prophylaxis and IV lopressor. She was also given perioperative entereg for bowel recovery.  She was given a PCA for pain control. Her  coumadin was resumed on POD 3.  She was monitored on telemetry throughout hospitalization and maintained sinus rhythm. PT/OT/wound care consulted. PT/OT felt no HH pt/ot needed. Her bowel function gradually returned and her diet was advanced. She had BMs. On day of discharge, she was tolerating a regular diet, her vitals were stable, her hgb was stable. She was ambulating without difficulty. Her INR was therapeutic. We discussed discharged instructions and HHN was arranged. Her PICC line was removed prior to dc  BP 124/60  Pulse 59  Temp(Src) 98.5 F (36.9 C) (Oral)  Resp 20  Ht 5\' 3"  (1.6 m)  Wt 136 lb 8 oz (61.916 kg)  BMI 24.19 kg/m2  SpO2 100% Alert, nad cta b/l Reg Soft, mild distension, +BS. +WV - intact; approp TTP No edema   Discharge Instructions  Discharge Instructions   Call MD for:  difficulty breathing, headache or visual disturbances    Complete by:  As directed      Call MD for:  persistant dizziness or light-headedness    Complete by:  As directed      Call MD for:  persistant nausea and vomiting    Complete by:  As directed      Call MD for:  redness, tenderness, or signs of infection (pain, swelling, redness, odor or green/yellow discharge around incision site)    Complete by:  As directed      Call MD for:  severe uncontrolled pain    Complete by:  As directed      Call MD for:  Complete by:  As directed   Temp >101     Diet - low sodium heart healthy    Complete by:  As directed      Discharge instructions    Complete by:  As directed   See [facility] discharge instructions     Discharge wound care:    Complete by:  As directed   Wound vac change (negative pressure wound therapy) to midline incision and open Left abdominal wall incision - change M/W/F; pt may shower     Increase activity slowly    Complete by:  As directed             Medication List    STOP taking these medications       acetaminophen 650 MG CR tablet  Commonly known as:  TYLENOL      erythromycin base 500 MG tablet  Commonly known as:  E-MYCIN     neomycin 500 MG tablet  Commonly known as:  MYCIFRADIN      TAKE these medications       aspirin EC 81 MG tablet  Take 81 mg by mouth at bedtime.     CALTRATE 600+D 600-800 MG-UNIT Tabs  Generic drug:  Calcium Carb-Cholecalciferol  Take 1 tablet by mouth daily.     cilostazol 100 MG tablet  Commonly known as:  PLETAL  Take 100 mg by mouth 2 (two) times daily.     ezetimibe 10 MG tablet  Commonly known as:  ZETIA  Take 10 mg by mouth every evening.     hydrocortisone valerate cream 0.2 %  Commonly known as:  WESTCORT  Apply 1 application topically 3 (three) times a week. On random days - for eczema     isosorbide mononitrate 30 MG 24 hr tablet  Commonly known as:  IMDUR  Take 30 mg by mouth every morning.     lisinopril 20 MG tablet  Commonly known as:  PRINIVIL,ZESTRIL  Take 10 mg by mouth 2 (two) times daily.     metoprolol succinate 50 MG 24 hr tablet  Commonly known as:  TOPROL-XL  Take 25 mg by mouth 2 (two) times daily.     nitroGLYCERIN 0.4 MG SL tablet  Commonly known as:  NITROSTAT  Place 1 tablet (0.4 mg total) under the tongue every 5 (five) minutes x 3 doses as needed for chest pain.     omega-3 acid ethyl esters 1 G capsule  Commonly known as:  LOVAZA  Take 1 g by mouth daily.     oxyCODONE-acetaminophen 5-325 MG per tablet  Commonly known as:  ROXICET  Take 1-2 tablets by mouth every 4 (four) hours as needed for severe pain.     pyridOXINE 100 MG tablet  Commonly known as:  VITAMIN B-6  Take 100 mg by mouth daily.     simvastatin 20 MG tablet  Commonly known as:  ZOCOR  Take 20 mg by mouth every evening.     SYSTANE OP  Place 1 drop into both eyes daily.     valACYclovir 1000 MG tablet  Commonly known as:  VALTREX  Take 2,000 mg by mouth daily as needed (outbreaks).     Vitamin D-3 5000 UNITS Tabs  Take 5,000 Units by mouth daily.     warfarin 2.5 MG tablet   Commonly known as:  COUMADIN  Take 1.25-2.5 mg by mouth daily. Take 1/2 tablet (1.25 mg) on Monday and Friday, take 1 tablet (2.5 mg) on all other  days           Follow-up Information   Follow up with Tommy Medal, RPH-CPP. Schedule an appointment as soon as possible for a visit in 3 days. (for INR check)    Contact information:   [place] [address] [phone]       Follow up with Gayland Curry, MD. Schedule an appointment as soon as possible for a visit in 2 weeks. (For wound re-check)    Specialty:  General Surgery   Contact information:   [address] [place] [address] [phone]        The results of significant diagnostics from this hospitalization (including imaging, microbiology, ancillary and laboratory) are listed below for reference.    Significant Diagnostic Studies: Ir US Guide Vasc Access Right  06/30/2014   CLINICAL DATA:  68 year old female with limited intravenous access in need of long-term IV antibiotics.  EXAM: POWER PICC LINE PLACEMENT WITH ULTRASOUND AND FLUOROSCOPIC GUIDANCE  FLUOROSCOPY TIME:  42 seconds  PROCEDURE: The patient was advised of the possible risks and complications and agreed to undergo the procedure. The patient was then brought to the angiographic suite for the procedure.  The right arm was prepped with chlorhexidine, draped in the usual sterile fashion using maximum barrier technique (cap and mask, sterile gown, sterile gloves, large sterile sheet, hand hygiene and cutaneous antisepsis) and infiltrated locally with 1% Lidocaine.  Ultrasound demonstrated patency of the right brachial vein, and this was documented with an image. Under real-time ultrasound guidance, this vein was accessed with a 21 gauge micropuncture needle and image documentation was performed. A 0.018 wire was introduced in to the vein. Over this, a 5 [place] dual lumen power injectable PICC was advanced to the lower SVC/right  atrial junction. Fluoroscopy during the procedure and fluoro spot radiograph confirms appropriate catheter position. The catheter was flushed and covered with a sterile dressing.  Catheter length: 37 cm  Complications: None  IMPRESSION: Successful right arm power PICC line placement with ultrasound and fluoroscopic guidance. The catheter is ready for use.  Signed,  Criselda Peaches, MD  [facility]  [facility]   Electronically Signed   By: Jacqulynn Cadet M.D.   On: 06/30/2014 17:09   Dg Colon W/cm - Wo/w Kub  06/12/2014   CLINICAL DATA:  Preop reversal of Hartmann's pouch. Diverticulitis with partial colectomy January 2015  EXAM: BE WITH CONTRAST - WITHOUT AND WITH KUB  TECHNIQUE: Single contrast enema with barium was performed through the rectum only.  FLUOROSCOPY TIME:  Two Min and 0 seconds  COMPARISON:  CT abdomen 12/03/2013  FINDINGS: KUB reveals a colostomy in the left upper quadrant. Normal bowel gas pattern without obstruction  Rectal catheter is placed. The rectum was filled with contrast. The colon terminates at the rectosigmoid junction. There is mild diverticular change in the remaining sigmoid segment. No stricture or mass lesion. No leak. Patient had considerable pain during the study.  IMPRESSION: Residual diverticulosis in the remaining sigmoid colon. No mass or leak is identified.   Electronically Signed   By: Franchot Gallo M.D.   On: 06/12/2014 10:56   Ir Fluoro Guide Cv Midline Picc Right  06/30/2014   CLINICAL DATA:  68 year old female with limited intravenous access in need of long-term IV antibiotics.  EXAM: POWER PICC LINE PLACEMENT WITH ULTRASOUND AND FLUOROSCOPIC GUIDANCE  FLUOROSCOPY TIME:  42 seconds  PROCEDURE: The patient was advised of the possible  risks and complications and agreed to undergo the procedure. The patient was then brought to the angiographic suite for the procedure.  The right arm was prepped with chlorhexidine,  draped in the usual sterile fashion using maximum barrier technique (cap and mask, sterile gown, sterile gloves, large sterile sheet, hand hygiene and cutaneous antisepsis) and infiltrated locally with 1% Lidocaine.  Ultrasound demonstrated patency of the right brachial vein, and this was documented with an image. Under real-time ultrasound guidance, this vein was accessed with a 21 gauge micropuncture needle and image documentation was performed. A 0.018 wire was introduced in to the vein. Over this, a 5 [place] dual lumen power injectable PICC was advanced to the lower SVC/right atrial junction. Fluoroscopy during the procedure and fluoro spot radiograph confirms appropriate catheter position. The catheter was flushed and covered with a sterile dressing.  Catheter length: 37 cm  Complications: None  IMPRESSION: Successful right arm power PICC line placement with ultrasound and fluoroscopic guidance. The catheter is ready for use.  Signed,  Criselda Peaches, MD  [facility]  [facility]   Electronically Signed   By: Jacqulynn Cadet M.D.   On: 06/30/2014 17:09    Microbiology: Recent Results (from the past 240 hour(s))  SURGICAL PCR SCREEN     Status: None   Collection Time    06/29/14  3:19 AM      Result Value Ref Range Status   MRSA, PCR NEGATIVE  NEGATIVE Final   Staphylococcus aureus NEGATIVE  NEGATIVE Final   Comment:            The Xpert SA Assay (FDA     approved for NASAL specimens     in patients over 4 years of age),     is one component of     a comprehensive surveillance     program.  Test performance has     been validated by [employer] for patients greater     than or equal to 49 year old.     It is not intended     to diagnose infection nor to     guide or monitor treatment.     Labs: Basic Metabolic Panel:  Recent Labs Lab 06/30/14 0435 07/01/14 0419 07/03/14 0500 07/06/14 0345  NA 136* 136* 136* 138  K 4.9  4.4 4.1 3.8  CL 101 100 97 99  CO2 22 27 30 28   GLUCOSE 220* 93 103* 99  BUN 10 7 4* 6  CREATININE 0.55 0.50 0.48* 0.50  CALCIUM 8.3* 8.2* 8.7 8.7  MG  --   --  1.9  --    Liver Function Tests: No results found for this basename: AST, ALT, ALKPHOS, BILITOT, PROT, ALBUMIN,  in the last 168 hours No results found for this basename: LIPASE, AMYLASE,  in the last 168 hours No results found for this basename: AMMONIA,  in the last 168 hours CBC:  Recent Labs Lab 06/30/14 0614 07/01/14 0419 07/03/14 0500 07/06/14 0345  WBC 19.6* 12.5* 6.9 7.0  NEUTROABS  --  8.9*  --   --   HGB 12.8 9.6* 9.4* 9.5*  HCT 37.1 28.1* 28.2* 28.5*  MCV 88.1 90.4 90.7 89.6  PLT 236 221 231 277   Cardiac Enzymes: No results found for this basename: CKTOTAL, CKMB, CKMBINDEX, TROPONINI,  in the last 168 hours BNP: BNP (last 3 results) No results found for this basename: PROBNP,  in the last 8760  hours CBG:  Recent Labs Lab 07/02/14 2208  GLUCAP 94    Principal Problem:   Status post colostomy takedown 06/29/2014 Active Problems:   Paroxysmal atrial fibrillation, maintaining SR on coumadin    CAD (coronary artery disease) with CABG   HTN (hypertension)   Back pain   Pulmonary hypertension   Chronic pain syndrome   Diverticulosis   Diverticulitis of colon with perforation s/p Hartmann/colectomy/colostomy Feb 2015   DDD (degenerative disc disease), lumbosacral   Anxiety   Sjogren's disease   Eczema   Time coordinating discharge: 20 minutes  Signed:  Gayland Curry, MD [facility], [place] 07/06/2014, 12:58 PM

## 2014-07-06 NOTE — Care Management Note (Signed)
CARE MANAGEMENT NOTE 07/06/2014  Patient:  [name], [name]   Account Number:  [id number]  Date Initiated:  06/30/2014  Documentation initiated by:  Leafy Kindle  Subjective/Objective Assessment:   68 yo female admitted for Colostomy takedown.     Action/Plan:   From home   Anticipated DC Date:  07/06/2014   Anticipated DC Plan:  [place]  CM consult      Choice offered to / List presented to:  C-1 Patient   DME arranged  VAC      DME agency  [facility]     HH arranged  HH-1 RN      [place].   Status of service:  Completed, signed off Medicare Important Message given?  YES (If response is "NO", the following Medicare IM given date fields will be blank) Date Medicare IM given:  07/01/2014 Medicare IM given by:  Leafy Kindle Date Additional Medicare IM given:  07/06/2014 Additional Medicare IM given by:  Leafy Kindle  Discharge Disposition:  [place]  Per UR Regulation:  Reviewed for med. necessity/level of care/duration of stay  If discussed at [place] of Stay Meetings, dates discussed:   07/02/2014    Comments:  07/06/14 Marney Doctor RN, BSN, NCM Pt [place] home today.  Awaiting Vac delivery from company ([employer]- called to inform of pt discharge, expected delivery time 2-3:30pm). [facility] Erasmo Downer called to inform of discharge.  All HH orders writen by MD.  Pt and daughter appreciative of CM assistance.  07/03/14 KATHY MAHABIR RN,BSN NCM [place].FAXED SIGNED ORDERS,& INFO TO [facility]#[phone],KJZ#[phone].[facility] REP KRISTEN AWARE OF HHRN NEEDED.AWAIT FINAL HHC ORDER.  07/02/14 KATHY MAHABIR RN,BSN NCM [phone] PER [facility] AWAIT BOWEL FXN,AWARE OF WOUND VAC FORM ON CHART FOR SIGNATURE.[facility] & [facility] REPS FOLLOWING.AWAIT FINAL ORDERS.  07/01/14 KATHY MAHABIR RN,BSN NCM 706  [phone] PT-N/A.[facility] HHRN-CHOSEN IF [facility]-HOME WOUND VAC NEEDED,SPOKE TO KRISTEN [facility] REP.FORMS ON CHART FOR MD DATE & SIGNATURE.[facility] REP [place] FOR [facility] WOUND VAC.AWAIT [place].  06/30/14 Marney Doctor RN,BSN NCM Has wound VAC, WOC following her, POD #1 takedown, for PICC, may need HH, continue to follow.

## 2014-07-06 NOTE — Consult Note (Signed)
WOC wound follow up Wound type:Surgical Measurements: Not taken today Wound DTO:IZTIW, pink, granulating Drainage (amount, consistency, odor) serosanguinous from midline, serous from ostomy takedown site Periwound: intact with some moisture associated skin damage from 10-2 o'clock at the ostomy takedown site from sponge resting on periwound skin and from 6-12 o'clock at the midline incision for the same reason. Dressing procedure/placement/frequency: Skin protected with drape on all sides for the midline incision and the ostomy takedown site.  1 piece of black granufoam inserted into midline incision and 1 piece of black granufoam placed in ostomy takedown site. Skin protected in area between the two wounds with drape, then covered with black granufoam as a bridge.  Dressing attached to negative pressure at 166mmHg and an excellent seal achieved.  Patient was premedicated prior to dressing change and required medication following due to extreme sensitivity-more at ostomy takedown site than at midline incision.  Daughter with and comforting her mother during the procedure.  Adhesive remover wipes used to release drape prior to beginning procedure. Patient and daughter counseled regarding need for increased protein during the tissue repair phase and indicate understanding. Both express gratitude for patience and understanding with need for shower today and also for slow dressing removal and application. Patient ready for discharge. Time spent with patient = 65 minutes Thanks, Maudie Flakes, MSN, RN, [place], [place], [place] [phone])

## 2014-07-06 NOTE — Telephone Encounter (Signed)
Pt LMOM - now d/c from [facility], post surgery (colectomy reversal), has HH coming, would like for them to draw INR.    Returned call, asked her to have RN call me tomorrow at assessment and will give orders for INR draw either Wednesday or Friday.

## 2014-07-07 ENCOUNTER — Telehealth (INDEPENDENT_AMBULATORY_CARE_PROVIDER_SITE_OTHER): Payer: Self-pay

## 2014-07-07 NOTE — Telephone Encounter (Signed)
JoAnna with [facility] was calling in to inform Dr [name] that the pt is more than likely going to refuse the wound vac tomorrow when they go put to place this back on pt. Di Kindle was with pt today and she was refusing to let her change the dressing. Spoke with Dr [name] he states that she can do wet-dry dressings at this time. LMOM for JoAnna to give her Dr Dois Davenport message.

## 2014-07-08 ENCOUNTER — Telehealth (INDEPENDENT_AMBULATORY_CARE_PROVIDER_SITE_OTHER): Payer: Self-pay

## 2014-07-08 NOTE — Telephone Encounter (Signed)
[facility] nurse calling to report that she visited the pt today to change her wound vac dressing.  First dressing change since pt d/c from hospital.  The patient complained of severe pain from the vac and refused to let the nurse apply new dressing.  She has contacted [facility] to p/u the wound vac.  Nurse applied a wet to dry dressing to the wound.  Please advise on further instructions.

## 2014-07-08 NOTE — Telephone Encounter (Signed)
Confirmed that wet/dry dressing changes are approved by Dr. [name].

## 2014-07-09 ENCOUNTER — Ambulatory Visit (INDEPENDENT_AMBULATORY_CARE_PROVIDER_SITE_OTHER): Payer: Medicare Other | Admitting: Pharmacist Clinician (PhC)/ Clinical Pharmacy Specialist

## 2014-07-09 DIAGNOSIS — I480 Paroxysmal atrial fibrillation: Secondary | ICD-10-CM

## 2014-07-09 DIAGNOSIS — I4891 Unspecified atrial fibrillation: Secondary | ICD-10-CM

## 2014-07-09 LAB — POCT INR: INR: 2

## 2014-07-10 ENCOUNTER — Telehealth (INDEPENDENT_AMBULATORY_CARE_PROVIDER_SITE_OTHER): Payer: Self-pay

## 2014-07-10 MED ORDER — OXYCODONE-ACETAMINOPHEN 5-325 MG PO TABS: 1.0000 | ORAL_TABLET | ORAL | Status: DC | PRN

## 2014-07-10 NOTE — Addendum Note (Signed)
Addended by: [name], Aerin Delany M on: 07/10/2014 12:53 PM   Modules accepted: Orders

## 2014-07-10 NOTE — Telephone Encounter (Signed)
Pt s/p colectomy on 06/29/14 by Dr [name]. Pt is calling today to get a refill on her Oxycodone 5/325mg . Pt states that she will be out of her meds by Monday. Pt rates her pain a 8 out of 10 at this time. Pt states that she is until to take any Ibuprofen. Informed pt that I would send Dr [name] a message and we will contact her as soon as we receive a response. Pt verbalized understanding

## 2014-07-10 NOTE — Telephone Encounter (Signed)
Patient aware Oxycodone/acet is ready for pick up

## 2014-07-10 NOTE — Telephone Encounter (Signed)
Will refill pain med. rx will be at front desk per protocol.

## 2014-07-10 NOTE — Telephone Encounter (Signed)
Pts daughter called requesting HHN visits be increased to 3 times a week. I have asked her to have HHN call so that we can have the correct agency and nurse to request order and give wd update. Daughter states she understands and will contact HHN.

## 2014-07-11 ENCOUNTER — Telehealth (INDEPENDENT_AMBULATORY_CARE_PROVIDER_SITE_OTHER): Payer: Self-pay | Admitting: Surgery

## 2014-07-11 NOTE — Telephone Encounter (Signed)
Pt called with RLQ pain after colostomy closure.  Moving bowels passing gas.  Pain with walking sharp made better with rest.  INR was 2 yesterday.  No CP SOB. Recommended heat and rest.  If it worsens seek care at ED.

## 2014-07-13 ENCOUNTER — Ambulatory Visit
Admission: RE | Admit: 2014-07-13 | Discharge: 2014-07-13 | Disposition: A | Discharge status: Home / Self Care | Payer: Medicare Other | Source: Ambulatory Visit | Attending: General Surgery | Admitting: General Surgery

## 2014-07-13 ENCOUNTER — Ambulatory Visit (INDEPENDENT_AMBULATORY_CARE_PROVIDER_SITE_OTHER): Payer: Medicare Other | Admitting: General Surgery

## 2014-07-13 DIAGNOSIS — R109 Unspecified abdominal pain: Secondary | ICD-10-CM

## 2014-07-13 LAB — CBC WITH DIFFERENTIAL/PLATELET
Basophils Absolute: 0.1 10*3/uL (ref 0.0–0.1)
Basophils Relative: 1 % (ref 0–1)
Eosinophils Absolute: 0.1 10*3/uL (ref 0.0–0.7)
Eosinophils Relative: 1 % (ref 0–5)
HCT: 34 % — ABNORMAL LOW (ref 36.0–46.0)
Hemoglobin: 11.5 g/dL — ABNORMAL LOW (ref 12.0–15.0)
Lymphocytes Relative: 40 % (ref 12–46)
Lymphs Abs: 3.9 10*3/uL (ref 0.7–4.0)
MCH: 29.9 pg (ref 26.0–34.0)
MCHC: 33.8 g/dL (ref 30.0–36.0)
MCV: 88.5 fL (ref 78.0–100.0)
Monocytes Absolute: 0.6 10*3/uL (ref 0.1–1.0)
Monocytes Relative: 6 % (ref 3–12)
Neutro Abs: 5.1 10*3/uL (ref 1.7–7.7)
Neutrophils Relative %: 52 % (ref 43–77)
Platelets: 552 10*3/uL — ABNORMAL HIGH (ref 150–400)
RBC: 3.84 MIL/uL — ABNORMAL LOW (ref 3.87–5.11)
RDW: 14.4 % (ref 11.5–15.5)
WBC: 9.8 10*3/uL (ref 4.0–10.5)

## 2014-07-13 NOTE — Progress Notes (Signed)
Subjective:     Patient ID: ZYAIR RHEIN, female   DOB: 08-Jan-1946, 68 y.o.   MRN: [id number]  HPI The patient comes in complaining of right-sided abdominal pain, burning in quality. History getting worse over the last 4 days although she has been having gas and some bowel movements.  Review of Systems No fevers or chills. No nausea vomiting.    Objective:   Physical Exam Her midline wound is well healed and almost completely closed. The left upper quadrant colostomy site has excellent granulation tissue. There is no evidence of infection.  Her abdomen is soft with excellent bowel sounds. She has no deep abdominal tenderness. There are no abdominal masses.    Assessment:     Doing well status post colostomy reversal. Right-sided abdominal pain appears to be more neuropathic perhaps related to nerve irritation secondary to scarring.     Plan:     We'll check some abdominal films and a CBC. Plan for her to return this previously arranged to see Dr. [name].

## 2014-07-17 ENCOUNTER — Ambulatory Visit (INDEPENDENT_AMBULATORY_CARE_PROVIDER_SITE_OTHER): Payer: Medicare Other | Admitting: Pharmacist Clinician (PhC)/ Clinical Pharmacy Specialist

## 2014-07-17 DIAGNOSIS — I480 Paroxysmal atrial fibrillation: Secondary | ICD-10-CM

## 2014-07-17 DIAGNOSIS — I4891 Unspecified atrial fibrillation: Secondary | ICD-10-CM

## 2014-07-17 LAB — POCT INR: INR: 2.2

## 2014-07-24 ENCOUNTER — Ambulatory Visit (INDEPENDENT_AMBULATORY_CARE_PROVIDER_SITE_OTHER): Payer: Medicare Other | Admitting: Pharmacist Clinician (PhC)/ Clinical Pharmacy Specialist

## 2014-07-24 DIAGNOSIS — I480 Paroxysmal atrial fibrillation: Secondary | ICD-10-CM

## 2014-07-24 DIAGNOSIS — I4891 Unspecified atrial fibrillation: Secondary | ICD-10-CM

## 2014-07-24 LAB — POCT INR: INR: 2.6

## 2014-07-29 ENCOUNTER — Encounter (INDEPENDENT_AMBULATORY_CARE_PROVIDER_SITE_OTHER): Payer: Medicare Other | Admitting: General Surgery

## 2014-08-06 ENCOUNTER — Encounter: Payer: Self-pay | Admitting: Cardiovascular Disease

## 2014-08-06 ENCOUNTER — Ambulatory Visit (INDEPENDENT_AMBULATORY_CARE_PROVIDER_SITE_OTHER): Payer: Medicare Other | Admitting: Cardiovascular Disease

## 2014-08-06 ENCOUNTER — Ambulatory Visit (INDEPENDENT_AMBULATORY_CARE_PROVIDER_SITE_OTHER): Payer: Medicare Other | Admitting: *Deleted

## 2014-08-06 VITALS — BP 130/72 | HR 60 | Ht 63.0 in | Wt 134.8 lb

## 2014-08-06 DIAGNOSIS — I2510 Atherosclerotic heart disease of native coronary artery without angina pectoris: Secondary | ICD-10-CM

## 2014-08-06 DIAGNOSIS — I10 Essential (primary) hypertension: Secondary | ICD-10-CM

## 2014-08-06 DIAGNOSIS — I2789 Other specified pulmonary heart diseases: Secondary | ICD-10-CM

## 2014-08-06 DIAGNOSIS — I209 Angina pectoris, unspecified: Secondary | ICD-10-CM

## 2014-08-06 DIAGNOSIS — I25118 Atherosclerotic heart disease of native coronary artery with other forms of angina pectoris: Secondary | ICD-10-CM

## 2014-08-06 DIAGNOSIS — I480 Paroxysmal atrial fibrillation: Secondary | ICD-10-CM

## 2014-08-06 DIAGNOSIS — I5022 Chronic systolic (congestive) heart failure: Secondary | ICD-10-CM

## 2014-08-06 DIAGNOSIS — Z7901 Long term (current) use of anticoagulants: Secondary | ICD-10-CM

## 2014-08-06 DIAGNOSIS — I272 Pulmonary hypertension, unspecified: Secondary | ICD-10-CM

## 2014-08-06 DIAGNOSIS — I4891 Unspecified atrial fibrillation: Secondary | ICD-10-CM

## 2014-08-06 DIAGNOSIS — Z933 Colostomy status: Secondary | ICD-10-CM

## 2014-08-06 DIAGNOSIS — I509 Heart failure, unspecified: Secondary | ICD-10-CM

## 2014-08-06 LAB — POCT INR: INR: 2.5

## 2014-08-06 MED ORDER — ISOSORBIDE MONONITRATE ER 60 MG PO TB24: 60.0000 mg | ORAL_TABLET | Freq: Every day | ORAL | Status: DC

## 2014-08-06 NOTE — Patient Instructions (Signed)
Increase Isosorbide to 60 mg daily    Your physician recommends that you schedule a follow-up appointment in: 3 months  Monday 11/09/14 at 2:30 pm.
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
## 2014-08-17 ENCOUNTER — Telehealth: Payer: Self-pay | Admitting: Cardiovascular Disease

## 2014-08-17 MED ORDER — ISOSORBIDE MONONITRATE ER 30 MG PO TB24
30.0000 mg | ORAL_TABLET | Freq: Every day | ORAL | Status: DC
Start: 2014-08-17 — End: 2017-07-25

## 2014-08-17 NOTE — Telephone Encounter (Signed)
Returned call to patient she stated Dr.Kelly increased isosorbide to 60 mg daily on 08/06/14.Stated she has felt bad since increase,headache,weak,no energy.Spoke to DOD Dr.Harding he advised she can decrease back to 30 mg daily.Advised if she has chest pain and is taking NTG she can increase isosorbide to 30 mg twice a day.Advised to call back if not better.

## 2014-08-17 NOTE — Telephone Encounter (Signed)
Pt said Dr Claiborne Billings doubled her Isosorbide,she feel worse. She have felt nausea, dizziness and had some blood in her stool. She thinks these might be some side effects.

## 2014-08-20 NOTE — Telephone Encounter (Signed)
acknowledged

## 2014-08-21 ENCOUNTER — Ambulatory Visit (INDEPENDENT_AMBULATORY_CARE_PROVIDER_SITE_OTHER): Payer: Medicare Other | Admitting: Pharmacist Clinician (PhC)/ Clinical Pharmacy Specialist

## 2014-08-21 VITALS — BP 168/90

## 2014-08-21 DIAGNOSIS — Z7901 Long term (current) use of anticoagulants: Secondary | ICD-10-CM

## 2014-08-21 DIAGNOSIS — I480 Paroxysmal atrial fibrillation: Secondary | ICD-10-CM

## 2014-08-21 LAB — CBC
HCT: 37 % (ref 36.0–46.0)
Hemoglobin: 12.2 g/dL (ref 12.0–15.0)
MCH: 28.9 pg (ref 26.0–34.0)
MCHC: 33 g/dL (ref 30.0–36.0)
MCV: 87.7 fL (ref 78.0–100.0)
Platelets: 304 10*3/uL (ref 150–400)
RBC: 4.22 MIL/uL (ref 3.87–5.11)
RDW: 14.3 % (ref 11.5–15.5)
WBC: 7.3 10*3/uL (ref 4.0–10.5)

## 2014-08-21 LAB — POCT INR: INR: 2.1

## 2014-08-21 NOTE — Progress Notes (Signed)
Pt in office for INR check today.  C/O low blood pressures at home, especially in mornings, down as low as 80s/50s.  Some dizziness/lightheadedness.  Verified with daughter's BP cuff, similar readings.  In office today BP elevated 168/90.  Gave pt BP log, asked her to check qam and other times during day if feels symptomatic.  Advised to hold next dose of lisinopril when BP drops below 100.  Will see her for BP as well as INR in 3 weeks.  Also will draw BMET and CBC today.

## 2014-08-22 LAB — BASIC METABOLIC PANEL
BUN: 13 mg/dL (ref 6–23)
CO2: 26 mEq/L (ref 19–32)
Calcium: 9.1 mg/dL (ref 8.4–10.5)
Chloride: 103 mEq/L (ref 96–112)
Creat: 0.67 mg/dL (ref 0.50–1.10)
Glucose, Bld: 80 mg/dL (ref 70–99)
Potassium: 4.4 mEq/L (ref 3.5–5.3)
Sodium: 139 mEq/L (ref 135–145)

## 2014-08-24 ENCOUNTER — Encounter: Payer: Self-pay | Admitting: *Deleted

## 2014-08-24 ENCOUNTER — Telehealth: Payer: Self-pay | Admitting: Cardiovascular Disease

## 2014-08-24 NOTE — Telephone Encounter (Signed)
Returned a all to patient informing her of lab results. Also notified her that she should be receiving a letter with results in the mail.

## 2014-08-24 NOTE — Telephone Encounter (Signed)
[name] Calling to get the results of her lab work .[name] Please Call

## 2014-09-09 ENCOUNTER — Other Ambulatory Visit: Payer: Self-pay | Admitting: Pharmacist Clinician (PhC)/ Clinical Pharmacy Specialist

## 2014-09-11 ENCOUNTER — Ambulatory Visit (INDEPENDENT_AMBULATORY_CARE_PROVIDER_SITE_OTHER): Payer: Medicare Other | Admitting: Pharmacist Clinician (PhC)/ Clinical Pharmacy Specialist

## 2014-09-11 ENCOUNTER — Ambulatory Visit: Payer: Medicare Other | Admitting: Pharmacist Clinician (PhC)/ Clinical Pharmacy Specialist

## 2014-09-11 VITALS — BP 152/80 | HR 64 | Ht 63.0 in | Wt 139.9 lb

## 2014-09-11 DIAGNOSIS — I2510 Atherosclerotic heart disease of native coronary artery without angina pectoris: Secondary | ICD-10-CM

## 2014-09-11 DIAGNOSIS — I10 Essential (primary) hypertension: Secondary | ICD-10-CM

## 2014-09-11 DIAGNOSIS — I480 Paroxysmal atrial fibrillation: Secondary | ICD-10-CM

## 2014-09-11 DIAGNOSIS — Z7901 Long term (current) use of anticoagulants: Secondary | ICD-10-CM

## 2014-09-11 DIAGNOSIS — I4891 Unspecified atrial fibrillation: Secondary | ICD-10-CM

## 2014-09-11 LAB — POCT INR: INR: 1.8

## 2014-09-11 NOTE — Patient Instructions (Signed)
   Your blood pressure today is  Check your blood pressure at home daily (if able) and keep record of the readings.  Take your BP meds as follows:  Bring all of your meds, your BP cuff and your record of home blood pressures to your next appointment.  Exercise as you're able, try to walk approximately 30 minutes per day.  Keep salt intake to a minimum, especially watch canned and prepared boxed foods.  Eat more fresh fruits and vegetables and fewer canned items.  Avoid eating in fast food restaurants.    HOW TO TAKE YOUR BLOOD PRESSURE:   Rest 5 minutes before taking your blood pressure.    Don't smoke or drink caffeinated beverages for at least 30 minutes before.   Take your blood pressure before (not after) you eat.   Sit comfortably with your back supported and both feet on the floor (don't cross your legs).   Elevate your arm to heart level on a table or a desk.   Use the proper sized cuff. It should fit smoothly and snugly around your bare upper arm. There should be enough room to slip a fingertip under the cuff. The bottom edge of the cuff should be 1 inch above the crease of the elbow.   Ideally, take 3 measurements at one sitting and record the average.

## 2014-09-14 ENCOUNTER — Encounter: Payer: Self-pay | Admitting: Pharmacist Clinician (PhC)/ Clinical Pharmacy Specialist
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
## 2014-09-17 ENCOUNTER — Encounter: Payer: Self-pay | Admitting: *Deleted

## 2014-09-17 ENCOUNTER — Telehealth: Payer: Self-pay | Admitting: Cardiovascular Disease

## 2014-09-17 NOTE — Telephone Encounter (Signed)
Ideally can hold coumadin for 3 days since she is in NSR, but I suspect her extractions are already done

## 2014-09-17 NOTE — Telephone Encounter (Signed)
I spoke with Kendrick Fries at Dr Ysidro Evert' office.  They have the patient in the chair in the office preparing to extract some teeth.  Dr Ysidro Evert wanted to verify that the patient could have the extractions while taking coumadin.  I advised that if they were comfortable with performing the extractions while the patient was on coumadin, it was fine with [place].  I will have DOD sign a letter verifying that.

## 2014-09-17 NOTE — Telephone Encounter (Signed)
Please fax yes or no whether need to be on or off Coumadin before she have extraction, also for the medical release.[name] Please fax to [phone] [id number]

## 2014-09-17 NOTE — Telephone Encounter (Signed)
Will forward to [place] to address with Dr Claiborne Billings

## 2014-09-17 NOTE — Telephone Encounter (Signed)
Voice order received from Dr Stanford Breed that patient can remain on coumadin for extractions and SBE prophylaxis is not needed.  Letter drafted, signed by Dr Stanford Breed, and faxed to Dr Ysidro Evert.

## 2014-09-17 NOTE — Telephone Encounter (Signed)
Please need to have wor.k,need to know about stopping her Coumadin. Please call asap,pt is there

## 2014-10-02 ENCOUNTER — Ambulatory Visit (INDEPENDENT_AMBULATORY_CARE_PROVIDER_SITE_OTHER): Payer: Medicare Other | Admitting: Pharmacist Clinician (PhC)/ Clinical Pharmacy Specialist

## 2014-10-02 DIAGNOSIS — Z7901 Long term (current) use of anticoagulants: Secondary | ICD-10-CM

## 2014-10-02 DIAGNOSIS — I4891 Unspecified atrial fibrillation: Secondary | ICD-10-CM

## 2014-10-02 DIAGNOSIS — I480 Paroxysmal atrial fibrillation: Secondary | ICD-10-CM

## 2014-10-02 LAB — POCT INR: INR: 2.7

## 2014-10-20 ENCOUNTER — Other Ambulatory Visit: Payer: Self-pay | Admitting: Cardiovascular Disease

## 2014-10-20 NOTE — Telephone Encounter (Signed)
Pt called and said would you please call her Cilostazol in today,she is completely out of it. She have already missed one pill today. Please call this to [phone].

## 2014-10-20 NOTE — Telephone Encounter (Signed)
Rx was sent to pharmacy electronically. 

## 2014-11-09 ENCOUNTER — Ambulatory Visit (INDEPENDENT_AMBULATORY_CARE_PROVIDER_SITE_OTHER): Payer: Medicare Other | Admitting: Cardiovascular Disease

## 2014-11-09 ENCOUNTER — Encounter: Payer: Self-pay | Admitting: Cardiovascular Disease

## 2014-11-09 ENCOUNTER — Ambulatory Visit (INDEPENDENT_AMBULATORY_CARE_PROVIDER_SITE_OTHER): Payer: Medicare Other | Admitting: Pharmacist Clinician (PhC)/ Clinical Pharmacy Specialist

## 2014-11-09 VITALS — BP 130/70 | HR 59 | Ht 62.0 in | Wt 142.4 lb

## 2014-11-09 DIAGNOSIS — Z7901 Long term (current) use of anticoagulants: Secondary | ICD-10-CM

## 2014-11-09 DIAGNOSIS — I2510 Atherosclerotic heart disease of native coronary artery without angina pectoris: Secondary | ICD-10-CM

## 2014-11-09 DIAGNOSIS — Z5181 Encounter for therapeutic drug level monitoring: Secondary | ICD-10-CM

## 2014-11-09 DIAGNOSIS — E785 Hyperlipidemia, unspecified: Secondary | ICD-10-CM

## 2014-11-09 DIAGNOSIS — I480 Paroxysmal atrial fibrillation: Secondary | ICD-10-CM

## 2014-11-09 DIAGNOSIS — I10 Essential (primary) hypertension: Secondary | ICD-10-CM

## 2014-11-09 DIAGNOSIS — I2583 Coronary atherosclerosis due to lipid rich plaque: Principal | ICD-10-CM

## 2014-11-09 DIAGNOSIS — I4891 Unspecified atrial fibrillation: Secondary | ICD-10-CM

## 2014-11-09 LAB — POCT INR: INR: 1.6

## 2014-11-09 NOTE — Patient Instructions (Signed)
Your physician wants you to follow-up in: 6 Months You will receive a reminder letter in the mail two months in advance. If you don't receive a letter, please call our office to schedule the follow-up appointment.  
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
## 2014-11-16 DIAGNOSIS — M65311 Trigger thumb, right thumb: Secondary | ICD-10-CM | POA: Diagnosis not present

## 2014-11-16 DIAGNOSIS — M810 Age-related osteoporosis without current pathological fracture: Secondary | ICD-10-CM | POA: Diagnosis not present

## 2014-12-01 DIAGNOSIS — R1084 Generalized abdominal pain: Secondary | ICD-10-CM | POA: Diagnosis not present

## 2014-12-01 DIAGNOSIS — K5900 Constipation, unspecified: Secondary | ICD-10-CM | POA: Diagnosis not present

## 2014-12-02 ENCOUNTER — Other Ambulatory Visit: Payer: Self-pay | Admitting: Gastroenterology

## 2014-12-02 ENCOUNTER — Ambulatory Visit
Admission: RE | Admit: 2014-12-02 | Discharge: 2014-12-02 | Disposition: A | Discharge status: Home / Self Care | Payer: Medicare Other | Source: Ambulatory Visit | Attending: Gastroenterology | Admitting: Gastroenterology

## 2014-12-02 ENCOUNTER — Ambulatory Visit (INDEPENDENT_AMBULATORY_CARE_PROVIDER_SITE_OTHER): Payer: Medicare Other | Admitting: Pharmacist Clinician (PhC)/ Clinical Pharmacy Specialist

## 2014-12-02 DIAGNOSIS — R109 Unspecified abdominal pain: Secondary | ICD-10-CM

## 2014-12-02 DIAGNOSIS — Z7901 Long term (current) use of anticoagulants: Secondary | ICD-10-CM

## 2014-12-02 DIAGNOSIS — K5909 Other constipation: Secondary | ICD-10-CM

## 2014-12-02 DIAGNOSIS — I480 Paroxysmal atrial fibrillation: Secondary | ICD-10-CM

## 2014-12-02 DIAGNOSIS — I4891 Unspecified atrial fibrillation: Secondary | ICD-10-CM

## 2014-12-02 DIAGNOSIS — Z951 Presence of aortocoronary bypass graft: Secondary | ICD-10-CM | POA: Diagnosis not present

## 2014-12-02 DIAGNOSIS — R1033 Periumbilical pain: Secondary | ICD-10-CM | POA: Diagnosis not present

## 2014-12-02 DIAGNOSIS — J9809 Other diseases of bronchus, not elsewhere classified: Secondary | ICD-10-CM | POA: Diagnosis not present

## 2014-12-02 DIAGNOSIS — J984 Other disorders of lung: Secondary | ICD-10-CM | POA: Diagnosis not present

## 2014-12-02 LAB — POCT INR: INR: 1.6

## 2014-12-07 DIAGNOSIS — M810 Age-related osteoporosis without current pathological fracture: Secondary | ICD-10-CM | POA: Diagnosis not present

## 2014-12-16 ENCOUNTER — Ambulatory Visit (INDEPENDENT_AMBULATORY_CARE_PROVIDER_SITE_OTHER): Payer: Medicare Other | Admitting: Pharmacist Clinician (PhC)/ Clinical Pharmacy Specialist

## 2014-12-16 DIAGNOSIS — H1012 Acute atopic conjunctivitis, left eye: Secondary | ICD-10-CM | POA: Diagnosis not present

## 2014-12-16 DIAGNOSIS — I4891 Unspecified atrial fibrillation: Secondary | ICD-10-CM

## 2014-12-16 DIAGNOSIS — Z7901 Long term (current) use of anticoagulants: Secondary | ICD-10-CM

## 2014-12-16 DIAGNOSIS — I480 Paroxysmal atrial fibrillation: Secondary | ICD-10-CM | POA: Diagnosis not present

## 2014-12-16 LAB — POCT INR: INR: 1.8

## 2014-12-17 DIAGNOSIS — H1012 Acute atopic conjunctivitis, left eye: Secondary | ICD-10-CM | POA: Diagnosis not present

## 2014-12-18 DIAGNOSIS — H15002 Unspecified scleritis, left eye: Secondary | ICD-10-CM | POA: Diagnosis not present

## 2014-12-24 ENCOUNTER — Other Ambulatory Visit (INDEPENDENT_AMBULATORY_CARE_PROVIDER_SITE_OTHER): Payer: Self-pay | Admitting: General Surgery

## 2014-12-24 DIAGNOSIS — Z9049 Acquired absence of other specified parts of digestive tract: Secondary | ICD-10-CM | POA: Diagnosis not present

## 2014-12-24 DIAGNOSIS — M810 Age-related osteoporosis without current pathological fracture: Secondary | ICD-10-CM | POA: Diagnosis not present

## 2014-12-24 DIAGNOSIS — M3501 Sicca syndrome with keratoconjunctivitis: Secondary | ICD-10-CM | POA: Diagnosis not present

## 2014-12-24 DIAGNOSIS — T7840XA Allergy, unspecified, initial encounter: Secondary | ICD-10-CM | POA: Diagnosis not present

## 2014-12-24 DIAGNOSIS — K5900 Constipation, unspecified: Secondary | ICD-10-CM | POA: Diagnosis not present

## 2014-12-24 DIAGNOSIS — R140 Abdominal distension (gaseous): Secondary | ICD-10-CM

## 2014-12-25 ENCOUNTER — Other Ambulatory Visit (INDEPENDENT_AMBULATORY_CARE_PROVIDER_SITE_OTHER): Payer: Self-pay

## 2014-12-25 DIAGNOSIS — Z9049 Acquired absence of other specified parts of digestive tract: Secondary | ICD-10-CM

## 2014-12-25 DIAGNOSIS — R140 Abdominal distension (gaseous): Secondary | ICD-10-CM

## 2014-12-25 DIAGNOSIS — H15002 Unspecified scleritis, left eye: Secondary | ICD-10-CM | POA: Diagnosis not present

## 2014-12-29 ENCOUNTER — Ambulatory Visit
Admission: RE | Admit: 2014-12-29 | Discharge: 2014-12-29 | Disposition: A | Discharge status: Home / Self Care | Payer: Medicare Other | Source: Ambulatory Visit | Attending: General Surgery | Admitting: General Surgery

## 2014-12-29 DIAGNOSIS — K439 Ventral hernia without obstruction or gangrene: Secondary | ICD-10-CM | POA: Diagnosis not present

## 2014-12-29 DIAGNOSIS — L821 Other seborrheic keratosis: Secondary | ICD-10-CM | POA: Diagnosis not present

## 2014-12-29 DIAGNOSIS — Z85828 Personal history of other malignant neoplasm of skin: Secondary | ICD-10-CM | POA: Diagnosis not present

## 2014-12-29 DIAGNOSIS — L249 Irritant contact dermatitis, unspecified cause: Secondary | ICD-10-CM | POA: Diagnosis not present

## 2014-12-29 DIAGNOSIS — D259 Leiomyoma of uterus, unspecified: Secondary | ICD-10-CM | POA: Diagnosis not present

## 2014-12-29 DIAGNOSIS — D692 Other nonthrombocytopenic purpura: Secondary | ICD-10-CM | POA: Diagnosis not present

## 2014-12-29 MED ORDER — IOHEXOL 300 MG/ML  SOLN
100.0000 mL | Freq: Once | INTRAMUSCULAR | Status: AC | PRN
  Administered 2014-12-29: 100 mL via INTRAVENOUS

## 2014-12-30 ENCOUNTER — Ambulatory Visit (INDEPENDENT_AMBULATORY_CARE_PROVIDER_SITE_OTHER): Payer: Medicare Other | Admitting: Pharmacist Clinician (PhC)/ Clinical Pharmacy Specialist

## 2014-12-30 DIAGNOSIS — I480 Paroxysmal atrial fibrillation: Secondary | ICD-10-CM | POA: Diagnosis not present

## 2014-12-30 DIAGNOSIS — I4891 Unspecified atrial fibrillation: Secondary | ICD-10-CM

## 2014-12-30 DIAGNOSIS — Z7901 Long term (current) use of anticoagulants: Secondary | ICD-10-CM

## 2014-12-30 LAB — POCT INR: INR: 2.5

## 2014-12-31 ENCOUNTER — Telehealth (INDEPENDENT_AMBULATORY_CARE_PROVIDER_SITE_OTHER): Payer: Self-pay | Admitting: General Surgery

## 2014-12-31 NOTE — Telephone Encounter (Signed)
Discussed CT results. Confirmed hernia and constipation. Pt has f/u with me next week to discuss hernia repair.

## 2015-01-07 DIAGNOSIS — M3500 Sicca syndrome, unspecified: Secondary | ICD-10-CM | POA: Diagnosis not present

## 2015-01-07 DIAGNOSIS — Z5181 Encounter for therapeutic drug level monitoring: Secondary | ICD-10-CM | POA: Diagnosis not present

## 2015-01-07 DIAGNOSIS — Z7901 Long term (current) use of anticoagulants: Secondary | ICD-10-CM | POA: Diagnosis not present

## 2015-01-07 DIAGNOSIS — K432 Incisional hernia without obstruction or gangrene: Secondary | ICD-10-CM | POA: Diagnosis not present

## 2015-01-07 DIAGNOSIS — I4891 Unspecified atrial fibrillation: Secondary | ICD-10-CM | POA: Diagnosis not present

## 2015-01-07 DIAGNOSIS — Z9049 Acquired absence of other specified parts of digestive tract: Secondary | ICD-10-CM | POA: Diagnosis not present

## 2015-01-12 ENCOUNTER — Other Ambulatory Visit: Payer: Self-pay | Admitting: Cardiovascular Disease

## 2015-01-12 NOTE — Telephone Encounter (Signed)
Rx refill sent to patient pharmacy   

## 2015-01-21 ENCOUNTER — Telehealth: Payer: Self-pay | Admitting: Cardiovascular Disease

## 2015-01-21 NOTE — Telephone Encounter (Signed)
LEFT MESSAGE ON VOICEMAIL A LETTER IS PRESENT IN OFFICE FROM DR ERIC WILSON.[facility] DESK)  AWAITING FOR DR Evette Georges OPINION

## 2015-01-21 NOTE — Telephone Encounter (Signed)
Pt wants to know if you received a fax from Dr Randall Hiss Wilson's office to clear her for hernia surgery? Does she need to have an appointment with Dr Claiborne Billings Before surgery?

## 2015-01-22 ENCOUNTER — Ambulatory Visit (INDEPENDENT_AMBULATORY_CARE_PROVIDER_SITE_OTHER): Payer: Medicare Other | Admitting: Pharmacist Clinician (PhC)/ Clinical Pharmacy Specialist

## 2015-01-22 DIAGNOSIS — Z7901 Long term (current) use of anticoagulants: Secondary | ICD-10-CM | POA: Diagnosis not present

## 2015-01-22 DIAGNOSIS — I4891 Unspecified atrial fibrillation: Secondary | ICD-10-CM | POA: Diagnosis not present

## 2015-01-22 DIAGNOSIS — I480 Paroxysmal atrial fibrillation: Secondary | ICD-10-CM | POA: Diagnosis not present

## 2015-01-22 LAB — POCT INR: INR: 3.7

## 2015-01-23 NOTE — Telephone Encounter (Signed)
Ok for surgery but will need to hold coumadin for 3-4 days depending on INR

## 2015-01-25 NOTE — Telephone Encounter (Signed)
Informed patient of Dr Evette Georges discussion. Patient asked if she had afib prior to surgery,should she call.  RN informed patient to contact office if that occur.she verbalized understanding.  [name] will send clearance form to Dr Greer Pickerel

## 2015-02-12 ENCOUNTER — Ambulatory Visit (INDEPENDENT_AMBULATORY_CARE_PROVIDER_SITE_OTHER): Payer: Medicare Other | Admitting: Pharmacist Clinician (PhC)/ Clinical Pharmacy Specialist

## 2015-02-12 DIAGNOSIS — I480 Paroxysmal atrial fibrillation: Secondary | ICD-10-CM

## 2015-02-12 DIAGNOSIS — I4891 Unspecified atrial fibrillation: Secondary | ICD-10-CM | POA: Diagnosis not present

## 2015-02-12 DIAGNOSIS — Z7901 Long term (current) use of anticoagulants: Secondary | ICD-10-CM

## 2015-02-12 LAB — POCT INR: INR: 2

## 2015-02-15 ENCOUNTER — Telehealth: Payer: Self-pay | Admitting: *Deleted

## 2015-02-15 DIAGNOSIS — H2511 Age-related nuclear cataract, right eye: Secondary | ICD-10-CM | POA: Diagnosis not present

## 2015-02-15 NOTE — Telephone Encounter (Signed)
Faxed clearance for hernia repair to [facility].

## 2015-02-16 ENCOUNTER — Telehealth: Payer: Self-pay | Admitting: Cardiovascular Disease

## 2015-02-16 MED ORDER — EZETIMIBE 10 MG PO TABS: 10.0000 mg | ORAL_TABLET | Freq: Every evening | ORAL | Status: DC

## 2015-02-16 NOTE — Telephone Encounter (Signed)
°  1. Which medications need to be refilled? Zetia  2. Which pharmacy is medication to be sent to? [phone]  3. Do they need a 30 day or 90 day supply? 30 and refills  4. Would they like a call back once the medication has been sent to the pharmacy? yes

## 2015-02-16 NOTE — Telephone Encounter (Signed)
Refill submitted to patient's preferred pharmacy. Informed patient. Pt voiced understanding, no other stated concerns at this time.  

## 2015-02-26 ENCOUNTER — Other Ambulatory Visit: Payer: Self-pay | Admitting: Cardiovascular Disease

## 2015-02-26 ENCOUNTER — Other Ambulatory Visit: Payer: Self-pay | Admitting: Pharmacist Clinician (PhC)/ Clinical Pharmacy Specialist

## 2015-02-26 MED ORDER — NITROGLYCERIN 0.4 MG SL SUBL: 0.4000 mg | SUBLINGUAL_TABLET | SUBLINGUAL | Status: DC | PRN

## 2015-02-26 NOTE — Telephone Encounter (Signed)
°  1. Which medications need to be refilled? Nitro Stat  2. Which pharmacy is medication to be sent to?[phone]  3. Do they need a 30 day or 90 day supply? #25  4. Would they like a call back once the medication has been sent to the pharmacy? no

## 2015-02-26 NOTE — Telephone Encounter (Signed)
E sent to pharmacy 

## 2015-03-04 ENCOUNTER — Other Ambulatory Visit: Payer: Self-pay | Admitting: General Surgery

## 2015-03-04 DIAGNOSIS — I878 Other specified disorders of veins: Secondary | ICD-10-CM

## 2015-03-05 ENCOUNTER — Other Ambulatory Visit: Payer: Self-pay | Admitting: General Surgery

## 2015-03-05 DIAGNOSIS — I878 Other specified disorders of veins: Secondary | ICD-10-CM

## 2015-03-08 ENCOUNTER — Encounter (HOSPITAL_COMMUNITY)
Admission: RE | Admit: 2015-03-08 | Discharge: 2015-03-08 | Disposition: A | Discharge status: Home / Self Care | Payer: Medicare Other | Source: Ambulatory Visit | Attending: General Surgery | Admitting: General Surgery

## 2015-03-08 ENCOUNTER — Encounter (HOSPITAL_COMMUNITY): Payer: Self-pay

## 2015-03-08 ENCOUNTER — Ambulatory Visit: Payer: Self-pay | Admitting: General Surgery

## 2015-03-08 LAB — PROTIME-INR
INR: 1.82 — ABNORMAL HIGH (ref 0.00–1.49)
Prothrombin Time: 21.2 seconds — ABNORMAL HIGH (ref 11.6–15.2)

## 2015-03-08 LAB — CBC
HCT: 43.8 % (ref 36.0–46.0)
Hemoglobin: 15 g/dL (ref 12.0–15.0)
MCH: 30.7 pg (ref 26.0–34.0)
MCHC: 34.2 g/dL (ref 30.0–36.0)
MCV: 89.8 fL (ref 78.0–100.0)
Platelets: 209 10*3/uL (ref 150–400)
RBC: 4.88 MIL/uL (ref 3.87–5.11)
RDW: 14 % (ref 11.5–15.5)
WBC: 10.3 10*3/uL (ref 4.0–10.5)

## 2015-03-08 LAB — BASIC METABOLIC PANEL
Anion gap: 10 (ref 5–15)
BUN: 12 mg/dL (ref 6–23)
CO2: 26 mmol/L (ref 19–32)
Calcium: 9.6 mg/dL (ref 8.4–10.5)
Chloride: 101 mmol/L (ref 96–112)
Creatinine, Ser: 0.72 mg/dL (ref 0.50–1.10)
GFR calc Af Amer: >90 mL/min (ref >90)
GFR calc non Af Amer: 85 mL/min — ABNORMAL LOW (ref >90)
Glucose, Bld: 93 mg/dL (ref 70–99)
Potassium: 3.7 mmol/L (ref 3.5–5.1)
Sodium: 137 mmol/L (ref 135–145)

## 2015-03-08 LAB — APTT: aPTT: 34 seconds (ref 24–37)

## 2015-03-08 NOTE — Pre-Procedure Instructions (Signed)
[name]  03/08/2015   Your procedure is scheduled on:  March 11, 2015  Report to [facility] Admitting at 7 AM.  Call this number if you have problems the morning of surgery: [phone]   Remember:   Do not eat food or drink liquids after midnight. Wednesday night    Take these medicines the morning of surgery with A SIP OF WATER: isosorbide mononitrate (IMDUR) , metoprolol succinate (TOPROL-XL)   STOP ASPIRIN, HERAL SUPPLEMENTS, FISH OIL, NSAIDS (ADVIL, ALEVE, IBUPROFEN) ONE WEEK PRIOR TO SURGERY   COUMADIN AS DIRECTED   Do not wear jewelry, make-up or nail polish.  Do not wear lotions, powders, or perfumes. You may wear deodorant.  Do not shave 48 hours prior to surgery.  Do not bring valuables to the hospital.  [facility] is not responsible  for any belongings or valuables.               Contacts, dentures or bridgework may not be worn into surgery.  Leave suitcase in the car. After surgery it may be brought to your room.  For patients admitted to the hospital, discharge time is determined by your    treatment team.     Special Instructions: "PREPARING FOR SURGERY"   Please read over the following fact sheets that you were given: Pain Booklet, Coughing and Deep Breathing and Surgical Site Infection Prevention

## 2015-03-08 NOTE — Progress Notes (Signed)
   03/08/15 1609  OBSTRUCTIVE SLEEP APNEA  Have you ever been diagnosed with sleep apnea through a sleep study? No  Do you snore loudly (loud enough to be heard through closed doors)?  1  Do you often feel tired, fatigued, or sleepy during the daytime? 0  Has anyone observed you stop breathing during your sleep? 0  Do you have, or are you being treated for high blood pressure? 1  BMI more than 35 kg/m2? 0  Age over 71 years old? 1  Neck circumference greater than 40 cm/16 inches? 0  Gender: 0

## 2015-03-08 NOTE — Progress Notes (Signed)
No presurgical order, notified office.  Sharyn Lull will send a message to Dr [name]

## 2015-03-09 ENCOUNTER — Encounter (HOSPITAL_COMMUNITY): Payer: Self-pay
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
## 2015-03-10 ENCOUNTER — Ambulatory Visit: Payer: Medicare Other | Admitting: Pharmacist Clinician (PhC)/ Clinical Pharmacy Specialist

## 2015-03-10 ENCOUNTER — Ambulatory Visit (HOSPITAL_COMMUNITY)
Admission: RE | Admit: 2015-03-10 | Discharge: 2015-03-10 | Disposition: A | Discharge status: Home / Self Care | Payer: Medicare Other | Source: Ambulatory Visit | Attending: General Surgery | Admitting: General Surgery

## 2015-03-10 DIAGNOSIS — I878 Other specified disorders of veins: Secondary | ICD-10-CM | POA: Insufficient documentation

## 2015-03-10 DIAGNOSIS — I10 Essential (primary) hypertension: Secondary | ICD-10-CM | POA: Diagnosis present

## 2015-03-10 DIAGNOSIS — Z951 Presence of aortocoronary bypass graft: Secondary | ICD-10-CM

## 2015-03-10 DIAGNOSIS — K5790 Diverticulosis of intestine, part unspecified, without perforation or abscess without bleeding: Secondary | ICD-10-CM | POA: Diagnosis present

## 2015-03-10 DIAGNOSIS — I358 Other nonrheumatic aortic valve disorders: Secondary | ICD-10-CM | POA: Diagnosis present

## 2015-03-10 DIAGNOSIS — Z91040 Latex allergy status: Secondary | ICD-10-CM

## 2015-03-10 DIAGNOSIS — Z452 Encounter for adjustment and management of vascular access device: Secondary | ICD-10-CM

## 2015-03-10 DIAGNOSIS — N390 Urinary tract infection, site not specified: Secondary | ICD-10-CM | POA: Diagnosis not present

## 2015-03-10 DIAGNOSIS — I4891 Unspecified atrial fibrillation: Secondary | ICD-10-CM | POA: Diagnosis not present

## 2015-03-10 DIAGNOSIS — M3500 Sicca syndrome, unspecified: Secondary | ICD-10-CM | POA: Diagnosis present

## 2015-03-10 DIAGNOSIS — I5022 Chronic systolic (congestive) heart failure: Secondary | ICD-10-CM | POA: Diagnosis present

## 2015-03-10 DIAGNOSIS — I739 Peripheral vascular disease, unspecified: Secondary | ICD-10-CM | POA: Diagnosis present

## 2015-03-10 DIAGNOSIS — I2510 Atherosclerotic heart disease of native coronary artery without angina pectoris: Secondary | ICD-10-CM | POA: Diagnosis present

## 2015-03-10 DIAGNOSIS — K439 Ventral hernia without obstruction or gangrene: Secondary | ICD-10-CM | POA: Diagnosis not present

## 2015-03-10 DIAGNOSIS — Z888 Allergy status to other drugs, medicaments and biological substances status: Secondary | ICD-10-CM

## 2015-03-10 DIAGNOSIS — Z87891 Personal history of nicotine dependence: Secondary | ICD-10-CM

## 2015-03-10 DIAGNOSIS — Z880 Allergy status to penicillin: Secondary | ICD-10-CM

## 2015-03-10 DIAGNOSIS — R339 Retention of urine, unspecified: Secondary | ICD-10-CM | POA: Diagnosis not present

## 2015-03-10 DIAGNOSIS — K432 Incisional hernia without obstruction or gangrene: Secondary | ICD-10-CM | POA: Diagnosis not present

## 2015-03-10 DIAGNOSIS — I959 Hypotension, unspecified: Secondary | ICD-10-CM | POA: Diagnosis not present

## 2015-03-10 DIAGNOSIS — Z85828 Personal history of other malignant neoplasm of skin: Secondary | ICD-10-CM

## 2015-03-10 DIAGNOSIS — Z7982 Long term (current) use of aspirin: Secondary | ICD-10-CM

## 2015-03-10 DIAGNOSIS — B9620 Unspecified Escherichia coli [E. coli] as the cause of diseases classified elsewhere: Secondary | ICD-10-CM | POA: Diagnosis not present

## 2015-03-10 DIAGNOSIS — Z881 Allergy status to other antibiotic agents status: Secondary | ICD-10-CM

## 2015-03-10 DIAGNOSIS — Z9049 Acquired absence of other specified parts of digestive tract: Secondary | ICD-10-CM | POA: Diagnosis not present

## 2015-03-10 DIAGNOSIS — I480 Paroxysmal atrial fibrillation: Secondary | ICD-10-CM | POA: Diagnosis present

## 2015-03-10 DIAGNOSIS — Z7901 Long term (current) use of anticoagulants: Secondary | ICD-10-CM

## 2015-03-10 DIAGNOSIS — I252 Old myocardial infarction: Secondary | ICD-10-CM

## 2015-03-10 DIAGNOSIS — E780 Pure hypercholesterolemia: Secondary | ICD-10-CM | POA: Diagnosis present

## 2015-03-10 DIAGNOSIS — K660 Peritoneal adhesions (postprocedural) (postinfection): Secondary | ICD-10-CM | POA: Diagnosis present

## 2015-03-10 DIAGNOSIS — Z79899 Other long term (current) drug therapy: Secondary | ICD-10-CM

## 2015-03-10 MED ORDER — HEPARIN SOD (PORK) LOCK FLUSH 100 UNIT/ML IV SOLN
INTRAVENOUS | Status: AC
  Filled 2015-03-10: qty 5

## 2015-03-10 MED ORDER — VANCOMYCIN HCL IN DEXTROSE 1-5 GM/200ML-% IV SOLN
1000.0000 mg | INTRAVENOUS | Status: DC
  Filled 2015-03-10: qty 200

## 2015-03-10 MED ORDER — CHLORHEXIDINE GLUCONATE 4 % EX LIQD
1.0000 "application " | Freq: Once | CUTANEOUS | Status: DC
  Filled 2015-03-10: qty 15

## 2015-03-10 MED ORDER — LIDOCAINE HCL 1 % IJ SOLN
INTRAMUSCULAR | Status: AC
  Filled 2015-03-10: qty 20

## 2015-03-10 NOTE — Procedures (Signed)
Successful RUE DL POWER PICC TIP SVC/RA NO COMP STABLE READY FOR USE  

## 2015-03-11 ENCOUNTER — Inpatient Hospital Stay (HOSPITAL_COMMUNITY): Payer: Medicare Other | Admitting: Vascular Surgery

## 2015-03-11 ENCOUNTER — Encounter (HOSPITAL_COMMUNITY)
Admission: RE | Disposition: A | Discharge status: Home / Self Care | Payer: Self-pay | Source: Ambulatory Visit | Attending: General Surgery

## 2015-03-11 ENCOUNTER — Inpatient Hospital Stay (HOSPITAL_COMMUNITY): Payer: Medicare Other | Admitting: Anesthesiology

## 2015-03-11 ENCOUNTER — Inpatient Hospital Stay (HOSPITAL_COMMUNITY)
Admission: RE | Admit: 2015-03-11 | Discharge: 2015-03-18 | DRG: 336 | Disposition: A | Discharge status: Home / Self Care | Payer: Medicare Other | Source: Ambulatory Visit | Attending: General Surgery | Admitting: General Surgery

## 2015-03-11 ENCOUNTER — Encounter (HOSPITAL_COMMUNITY): Payer: Self-pay | Admitting: *Deleted

## 2015-03-11 DIAGNOSIS — I4891 Unspecified atrial fibrillation: Secondary | ICD-10-CM | POA: Diagnosis not present

## 2015-03-11 DIAGNOSIS — I252 Old myocardial infarction: Secondary | ICD-10-CM | POA: Diagnosis not present

## 2015-03-11 DIAGNOSIS — I480 Paroxysmal atrial fibrillation: Secondary | ICD-10-CM | POA: Diagnosis not present

## 2015-03-11 DIAGNOSIS — K5790 Diverticulosis of intestine, part unspecified, without perforation or abscess without bleeding: Secondary | ICD-10-CM | POA: Diagnosis present

## 2015-03-11 DIAGNOSIS — K432 Incisional hernia without obstruction or gangrene: Secondary | ICD-10-CM | POA: Diagnosis not present

## 2015-03-11 DIAGNOSIS — I959 Hypotension, unspecified: Secondary | ICD-10-CM | POA: Diagnosis not present

## 2015-03-11 DIAGNOSIS — Z7982 Long term (current) use of aspirin: Secondary | ICD-10-CM | POA: Diagnosis not present

## 2015-03-11 DIAGNOSIS — I25810 Atherosclerosis of coronary artery bypass graft(s) without angina pectoris: Secondary | ICD-10-CM | POA: Diagnosis not present

## 2015-03-11 DIAGNOSIS — Z85828 Personal history of other malignant neoplasm of skin: Secondary | ICD-10-CM | POA: Diagnosis not present

## 2015-03-11 DIAGNOSIS — N390 Urinary tract infection, site not specified: Secondary | ICD-10-CM | POA: Diagnosis not present

## 2015-03-11 DIAGNOSIS — I10 Essential (primary) hypertension: Secondary | ICD-10-CM | POA: Diagnosis present

## 2015-03-11 DIAGNOSIS — E780 Pure hypercholesterolemia: Secondary | ICD-10-CM | POA: Diagnosis present

## 2015-03-11 DIAGNOSIS — I5022 Chronic systolic (congestive) heart failure: Secondary | ICD-10-CM | POA: Diagnosis not present

## 2015-03-11 DIAGNOSIS — Z951 Presence of aortocoronary bypass graft: Secondary | ICD-10-CM | POA: Diagnosis not present

## 2015-03-11 DIAGNOSIS — Z8719 Personal history of other diseases of the digestive system: Secondary | ICD-10-CM

## 2015-03-11 DIAGNOSIS — I25118 Atherosclerotic heart disease of native coronary artery with other forms of angina pectoris: Secondary | ICD-10-CM | POA: Diagnosis present

## 2015-03-11 DIAGNOSIS — B9620 Unspecified Escherichia coli [E. coli] as the cause of diseases classified elsewhere: Secondary | ICD-10-CM | POA: Diagnosis not present

## 2015-03-11 DIAGNOSIS — Z880 Allergy status to penicillin: Secondary | ICD-10-CM | POA: Diagnosis not present

## 2015-03-11 DIAGNOSIS — R0602 Shortness of breath: Secondary | ICD-10-CM | POA: Diagnosis not present

## 2015-03-11 DIAGNOSIS — K660 Peritoneal adhesions (postprocedural) (postinfection): Secondary | ICD-10-CM | POA: Diagnosis present

## 2015-03-11 DIAGNOSIS — R339 Retention of urine, unspecified: Secondary | ICD-10-CM | POA: Diagnosis not present

## 2015-03-11 DIAGNOSIS — Z7901 Long term (current) use of anticoagulants: Secondary | ICD-10-CM | POA: Diagnosis not present

## 2015-03-11 DIAGNOSIS — Z87891 Personal history of nicotine dependence: Secondary | ICD-10-CM | POA: Diagnosis not present

## 2015-03-11 DIAGNOSIS — I358 Other nonrheumatic aortic valve disorders: Secondary | ICD-10-CM | POA: Diagnosis present

## 2015-03-11 DIAGNOSIS — Z888 Allergy status to other drugs, medicaments and biological substances status: Secondary | ICD-10-CM | POA: Diagnosis not present

## 2015-03-11 DIAGNOSIS — I9589 Other hypotension: Secondary | ICD-10-CM | POA: Diagnosis not present

## 2015-03-11 DIAGNOSIS — I2510 Atherosclerotic heart disease of native coronary artery without angina pectoris: Secondary | ICD-10-CM | POA: Diagnosis not present

## 2015-03-11 DIAGNOSIS — Z79899 Other long term (current) drug therapy: Secondary | ICD-10-CM | POA: Diagnosis not present

## 2015-03-11 DIAGNOSIS — I739 Peripheral vascular disease, unspecified: Secondary | ICD-10-CM | POA: Diagnosis not present

## 2015-03-11 DIAGNOSIS — I9581 Postprocedural hypotension: Secondary | ICD-10-CM | POA: Diagnosis not present

## 2015-03-11 DIAGNOSIS — Z91040 Latex allergy status: Secondary | ICD-10-CM | POA: Diagnosis not present

## 2015-03-11 DIAGNOSIS — Z881 Allergy status to other antibiotic agents status: Secondary | ICD-10-CM | POA: Diagnosis not present

## 2015-03-11 DIAGNOSIS — Z9889 Other specified postprocedural states: Secondary | ICD-10-CM

## 2015-03-11 DIAGNOSIS — M3500 Sicca syndrome, unspecified: Secondary | ICD-10-CM | POA: Diagnosis present

## 2015-03-11 HISTORY — PX: LAPAROSCOPIC ASSISTED VENTRAL HERNIA REPAIR: SHX6312

## 2015-03-11 HISTORY — PX: VENTRAL HERNIA REPAIR: SHX424

## 2015-03-11 HISTORY — PX: INSERTION OF MESH: SHX5868

## 2015-03-11 LAB — HEPATIC FUNCTION PANEL
ALT: 21 U/L (ref 0–35)
AST: 23 U/L (ref 0–37)
Albumin: 3.5 g/dL (ref 3.5–5.2)
Alkaline Phosphatase: 38 U/L — ABNORMAL LOW (ref 39–117)
Bilirubin, Direct: 0.1 mg/dL (ref 0.0–0.5)
Indirect Bilirubin: 0.5 mg/dL (ref 0.3–0.9)
Total Bilirubin: 0.6 mg/dL (ref 0.3–1.2)
Total Protein: 5.8 g/dL — ABNORMAL LOW (ref 6.0–8.3)

## 2015-03-11 LAB — PROTIME-INR
INR: 1.26 (ref 0.00–1.49)
Prothrombin Time: 15.9 seconds — ABNORMAL HIGH (ref 11.6–15.2)

## 2015-03-11 SURGERY — REPAIR, HERNIA, VENTRAL, LAPAROSCOPY-ASSISTED
Anesthesia: General | Site: Abdomen

## 2015-03-11 MED ORDER — HYDROMORPHONE 0.3 MG/ML IV SOLN
INTRAVENOUS | Status: AC
  Administered 2015-03-11: 0.3 mg
  Filled 2015-03-11: qty 25

## 2015-03-11 MED ORDER — FENTANYL CITRATE (PF) 250 MCG/5ML IJ SOLN
INTRAMUSCULAR | Status: AC
  Filled 2015-03-11: qty 5

## 2015-03-11 MED ORDER — ISOSORBIDE MONONITRATE ER 30 MG PO TB24
30.0000 mg | ORAL_TABLET | Freq: Every day | ORAL | Status: DC
  Administered 2015-03-13 – 2015-03-18 (×5): 30 mg via ORAL
  Filled 2015-03-11 (×7): qty 1

## 2015-03-11 MED ORDER — LACTATED RINGERS IV SOLN
INTRAVENOUS | Status: DC
  Administered 2015-03-11 (×3): via INTRAVENOUS

## 2015-03-11 MED ORDER — PROPOFOL 10 MG/ML IV BOLUS
INTRAVENOUS | Status: AC
  Filled 2015-03-11: qty 20

## 2015-03-11 MED ORDER — MIDAZOLAM HCL 2 MG/2ML IJ SOLN
INTRAMUSCULAR | Status: AC
  Filled 2015-03-11: qty 2

## 2015-03-11 MED ORDER — SUCCINYLCHOLINE CHLORIDE 20 MG/ML IJ SOLN
INTRAMUSCULAR | Status: AC
  Filled 2015-03-11: qty 2

## 2015-03-11 MED ORDER — SODIUM CHLORIDE 0.9 % IJ SOLN
INTRAMUSCULAR | Status: DC | PRN
  Administered 2015-03-11: 50 mL via INTRAVENOUS

## 2015-03-11 MED ORDER — ONDANSETRON HCL 4 MG/2ML IJ SOLN
INTRAMUSCULAR | Status: AC
  Filled 2015-03-11: qty 2

## 2015-03-11 MED ORDER — ROCURONIUM BROMIDE 100 MG/10ML IV SOLN
INTRAVENOUS | Status: DC | PRN
  Administered 2015-03-11: 35 mg via INTRAVENOUS
  Administered 2015-03-11: 15 mg via INTRAVENOUS

## 2015-03-11 MED ORDER — DIPHENHYDRAMINE HCL 50 MG/ML IJ SOLN
12.5000 mg | Freq: Four times a day (QID) | INTRAMUSCULAR | Status: DC | PRN
  Filled 2015-03-11: qty 0.25

## 2015-03-11 MED ORDER — VECURONIUM BROMIDE 10 MG IV SOLR
INTRAVENOUS | Status: AC
  Filled 2015-03-11: qty 10

## 2015-03-11 MED ORDER — KCL IN DEXTROSE-NACL 20-5-0.45 MEQ/L-%-% IV SOLN
INTRAVENOUS | Status: DC
  Administered 2015-03-11: 75 mL/h via INTRAVENOUS
  Administered 2015-03-12 – 2015-03-15 (×6): via INTRAVENOUS
  Filled 2015-03-11 (×12): qty 1000

## 2015-03-11 MED ORDER — EPHEDRINE SULFATE 50 MG/ML IJ SOLN
INTRAMUSCULAR | Status: AC
  Filled 2015-03-11: qty 2

## 2015-03-11 MED ORDER — HEPARIN SODIUM (PORCINE) 5000 UNIT/ML IJ SOLN
5000.0000 [IU] | Freq: Three times a day (TID) | INTRAMUSCULAR | Status: DC
  Administered 2015-03-12 – 2015-03-18 (×19): 5000 [IU] via SUBCUTANEOUS
  Filled 2015-03-11 (×19): qty 1

## 2015-03-11 MED ORDER — SODIUM CHLORIDE 0.9 % IJ SOLN: 9.0000 mL | INTRAMUSCULAR | Status: DC | PRN

## 2015-03-11 MED ORDER — ALBUMIN HUMAN 5 % IV SOLN
INTRAVENOUS | Status: DC | PRN
  Administered 2015-03-11: 11:00:00 via INTRAVENOUS

## 2015-03-11 MED ORDER — BUPIVACAINE-EPINEPHRINE (PF) 0.25% -1:200000 IJ SOLN
INTRAMUSCULAR | Status: AC
  Filled 2015-03-11: qty 30

## 2015-03-11 MED ORDER — LISINOPRIL 10 MG PO TABS
10.0000 mg | ORAL_TABLET | Freq: Two times a day (BID) | ORAL | Status: DC
  Administered 2015-03-13 – 2015-03-14 (×2): 10 mg via ORAL
  Filled 2015-03-11 (×6): qty 1

## 2015-03-11 MED ORDER — HYDROMORPHONE HCL 1 MG/ML IJ SOLN
0.2500 mg | INTRAMUSCULAR | Status: DC | PRN
  Administered 2015-03-11: 0.5 mg via INTRAVENOUS
  Administered 2015-03-11 (×2): 0.25 mg via INTRAVENOUS
  Administered 2015-03-11: 0.5 mg via INTRAVENOUS

## 2015-03-11 MED ORDER — ACETAMINOPHEN 10 MG/ML IV SOLN
1000.0000 mg | Freq: Four times a day (QID) | INTRAVENOUS | Status: AC
  Administered 2015-03-11 – 2015-03-12 (×3): 1000 mg via INTRAVENOUS
  Filled 2015-03-11 (×3): qty 100

## 2015-03-11 MED ORDER — FENTANYL CITRATE (PF) 100 MCG/2ML IJ SOLN
INTRAMUSCULAR | Status: DC | PRN
  Administered 2015-03-11 (×4): 50 ug via INTRAVENOUS
  Administered 2015-03-11: 25 ug via INTRAVENOUS
  Administered 2015-03-11: 50 ug via INTRAVENOUS
  Administered 2015-03-11: 25 ug via INTRAVENOUS
  Administered 2015-03-11 (×2): 50 ug via INTRAVENOUS

## 2015-03-11 MED ORDER — LIDOCAINE HCL (CARDIAC) 20 MG/ML IV SOLN
INTRAVENOUS | Status: AC
  Filled 2015-03-11: qty 5

## 2015-03-11 MED ORDER — DIPHENHYDRAMINE HCL 50 MG/ML IJ SOLN: 12.5000 mg | Freq: Four times a day (QID) | INTRAMUSCULAR | Status: DC | PRN

## 2015-03-11 MED ORDER — GLYCOPYRROLATE 0.2 MG/ML IJ SOLN
INTRAMUSCULAR | Status: AC
  Filled 2015-03-11: qty 3

## 2015-03-11 MED ORDER — HYDROMORPHONE 0.3 MG/ML IV SOLN
INTRAVENOUS | Status: DC
  Administered 2015-03-12: 1.59 mg via INTRAVENOUS
  Administered 2015-03-12: 1.79 mg via INTRAVENOUS
  Administered 2015-03-12: 0.599 mg via INTRAVENOUS
  Administered 2015-03-12: 0.8 mg via INTRAVENOUS
  Administered 2015-03-12: 1.59 mg via INTRAVENOUS
  Administered 2015-03-12: 18:00:00 via INTRAVENOUS
  Administered 2015-03-12: 1.59 mg via INTRAVENOUS
  Administered 2015-03-13 (×2): 1.19 mg via INTRAVENOUS
  Administered 2015-03-13: 1.69 mg via INTRAVENOUS
  Administered 2015-03-13 (×2): 0.999 mg via INTRAVENOUS
  Administered 2015-03-13: 18:00:00 via INTRAVENOUS
  Administered 2015-03-14: 0.799 mg via INTRAVENOUS
  Administered 2015-03-14: 1.79 mg via INTRAVENOUS
  Administered 2015-03-14: 0.999 mg via INTRAVENOUS
  Administered 2015-03-14: 1.19 mg via INTRAVENOUS
  Administered 2015-03-14: 1.78 mg via INTRAVENOUS
  Administered 2015-03-14: 1.19 mg via INTRAVENOUS
  Administered 2015-03-15: 0.559 mg via INTRAVENOUS
  Administered 2015-03-15: 09:00:00 via INTRAVENOUS
  Administered 2015-03-15: 1.59 mg via INTRAVENOUS
  Administered 2015-03-15: 1.39 mg via INTRAVENOUS
  Administered 2015-03-15: 1.59 mg via INTRAVENOUS
  Administered 2015-03-15: 1.19 mg via INTRAVENOUS
  Filled 2015-03-11 (×4): qty 25

## 2015-03-11 MED ORDER — HYDROMORPHONE HCL 1 MG/ML IJ SOLN
INTRAMUSCULAR | Status: AC
  Filled 2015-03-11: qty 1

## 2015-03-11 MED ORDER — PROPOFOL 10 MG/ML IV BOLUS
INTRAVENOUS | Status: DC | PRN
  Administered 2015-03-11: 100 mg via INTRAVENOUS
  Administered 2015-03-11: 30 mg via INTRAVENOUS

## 2015-03-11 MED ORDER — NALOXONE HCL 0.4 MG/ML IJ SOLN: 0.4000 mg | INTRAMUSCULAR | Status: DC | PRN

## 2015-03-11 MED ORDER — VECURONIUM BROMIDE 10 MG IV SOLR
INTRAVENOUS | Status: DC | PRN
  Administered 2015-03-11: 2 mg via INTRAVENOUS
  Administered 2015-03-11 (×7): 1 mg via INTRAVENOUS

## 2015-03-11 MED ORDER — ACETAMINOPHEN 10 MG/ML IV SOLN
INTRAVENOUS | Status: AC
  Administered 2015-03-11: 1000 mg
  Filled 2015-03-11: qty 100

## 2015-03-11 MED ORDER — DIPHENHYDRAMINE HCL 12.5 MG/5ML PO ELIX
12.5000 mg | ORAL_SOLUTION | Freq: Four times a day (QID) | ORAL | Status: DC | PRN
  Filled 2015-03-11: qty 5

## 2015-03-11 MED ORDER — ROCURONIUM BROMIDE 50 MG/5ML IV SOLN
INTRAVENOUS | Status: AC
  Filled 2015-03-11: qty 1

## 2015-03-11 MED ORDER — METOPROLOL SUCCINATE ER 25 MG PO TB24
25.0000 mg | ORAL_TABLET | Freq: Two times a day (BID) | ORAL | Status: DC
  Administered 2015-03-13 – 2015-03-14 (×2): 25 mg via ORAL
  Filled 2015-03-11 (×6): qty 1

## 2015-03-11 MED ORDER — VANCOMYCIN HCL 1000 MG IV SOLR
1000.0000 mg | INTRAVENOUS | Status: DC | PRN
  Administered 2015-03-11: 1000 mg via INTRAVENOUS

## 2015-03-11 MED ORDER — EZETIMIBE 10 MG PO TABS
10.0000 mg | ORAL_TABLET | Freq: Every evening | ORAL | Status: DC
  Administered 2015-03-12 – 2015-03-17 (×6): 10 mg via ORAL
  Filled 2015-03-11 (×8): qty 1

## 2015-03-11 MED ORDER — NALOXONE HCL 0.4 MG/ML IJ SOLN
0.4000 mg | INTRAMUSCULAR | Status: DC | PRN
  Filled 2015-03-11: qty 1

## 2015-03-11 MED ORDER — EVICEL 5 ML EX KIT
PACK | CUTANEOUS | Status: AC
  Filled 2015-03-11: qty 1

## 2015-03-11 MED ORDER — EVICEL 2 ML EX KIT
PACK | CUTANEOUS | Status: AC
  Filled 2015-03-11: qty 2

## 2015-03-11 MED ORDER — GLYCOPYRROLATE 0.2 MG/ML IJ SOLN
INTRAMUSCULAR | Status: DC | PRN
  Administered 2015-03-11: .8 mg via INTRAVENOUS
  Administered 2015-03-11: 0.2 mg via INTRAVENOUS

## 2015-03-11 MED ORDER — EVICEL 2 ML EX KIT
PACK | CUTANEOUS | Status: DC | PRN
  Administered 2015-03-11 (×2): 2 mL

## 2015-03-11 MED ORDER — PHENYLEPHRINE 40 MCG/ML (10ML) SYRINGE FOR IV PUSH (FOR BLOOD PRESSURE SUPPORT)
PREFILLED_SYRINGE | INTRAVENOUS | Status: AC
  Filled 2015-03-11: qty 40

## 2015-03-11 MED ORDER — MIDAZOLAM HCL 5 MG/5ML IJ SOLN
INTRAMUSCULAR | Status: DC | PRN
  Administered 2015-03-11: 2 mg via INTRAVENOUS

## 2015-03-11 MED ORDER — SODIUM CHLORIDE 0.9 % IR SOLN
Status: DC | PRN
  Administered 2015-03-11: 1000 mL
  Administered 2015-03-11: 1
  Administered 2015-03-11: 1000 mL

## 2015-03-11 MED ORDER — BUPIVACAINE LIPOSOME 1.3 % IJ SUSP
20.0000 mL | INTRAMUSCULAR | Status: AC
Start: 2015-03-11 — End: 2015-03-11
  Administered 2015-03-11: 20 mL
  Filled 2015-03-11: qty 20

## 2015-03-11 MED ORDER — KCL IN DEXTROSE-NACL 20-5-0.45 MEQ/L-%-% IV SOLN
INTRAVENOUS | Status: AC
  Filled 2015-03-11: qty 1000

## 2015-03-11 MED ORDER — DIPHENHYDRAMINE HCL 12.5 MG/5ML PO ELIX: 12.5000 mg | ORAL_SOLUTION | Freq: Four times a day (QID) | ORAL | Status: DC | PRN

## 2015-03-11 MED ORDER — SODIUM CHLORIDE 0.9 % IJ SOLN
INTRAMUSCULAR | Status: AC
  Filled 2015-03-11: qty 10

## 2015-03-11 MED ORDER — EVICEL 5 ML EX KIT
PACK | CUTANEOUS | Status: DC | PRN
  Administered 2015-03-11 (×2): 5 mL

## 2015-03-11 MED ORDER — ONDANSETRON HCL 4 MG/2ML IJ SOLN: 4.0000 mg | Freq: Four times a day (QID) | INTRAMUSCULAR | Status: DC | PRN

## 2015-03-11 MED ORDER — LIDOCAINE HCL (CARDIAC) 20 MG/ML IV SOLN
INTRAVENOUS | Status: DC | PRN
  Administered 2015-03-11: 80 mg via INTRAVENOUS

## 2015-03-11 MED ORDER — MIDAZOLAM HCL 2 MG/2ML IJ SOLN
2.0000 mg | Freq: Once | INTRAMUSCULAR | Status: DC
  Filled 2015-03-11: qty 2

## 2015-03-11 MED ORDER — NEOSTIGMINE METHYLSULFATE 10 MG/10ML IV SOLN
INTRAVENOUS | Status: DC | PRN
  Administered 2015-03-11: 5 mg via INTRAVENOUS

## 2015-03-11 MED ORDER — DEXTROSE 5 % IV SOLN
500.0000 mg | Freq: Three times a day (TID) | INTRAVENOUS | Status: DC
  Administered 2015-03-11 – 2015-03-18 (×21): 500 mg via INTRAVENOUS
  Filled 2015-03-11 (×32): qty 5

## 2015-03-11 MED ORDER — STERILE WATER FOR INJECTION IJ SOLN
INTRAMUSCULAR | Status: AC
  Filled 2015-03-11: qty 10

## 2015-03-11 MED ORDER — ONDANSETRON HCL 4 MG/2ML IJ SOLN
4.0000 mg | Freq: Four times a day (QID) | INTRAMUSCULAR | Status: DC | PRN
  Filled 2015-03-11: qty 2

## 2015-03-11 MED ORDER — ONDANSETRON HCL 4 MG/2ML IJ SOLN
INTRAMUSCULAR | Status: DC | PRN
  Administered 2015-03-11: 4 mg via INTRAVENOUS

## 2015-03-11 MED ORDER — FENTANYL CITRATE (PF) 100 MCG/2ML IJ SOLN
100.0000 ug | Freq: Once | INTRAMUSCULAR | Status: DC
  Filled 2015-03-11: qty 2

## 2015-03-11 MED ORDER — EPHEDRINE SULFATE 50 MG/ML IJ SOLN
INTRAMUSCULAR | Status: DC | PRN
  Administered 2015-03-11 (×5): 5 mg via INTRAVENOUS
  Administered 2015-03-11: 10 mg via INTRAVENOUS

## 2015-03-11 SURGICAL SUPPLY — 86 items
APPLICATOR COTTON TIP 6IN STRL (MISCELLANEOUS) ×2 IMPLANT
APPLIER CLIP 5 13 M/L LIGAMAX5 (MISCELLANEOUS)
APPLIER CLIP ROT 10 11.4 M/L (STAPLE)
BENZOIN TINCTURE PRP APPL 2/3 (GAUZE/BANDAGES/DRESSINGS) ×2 IMPLANT
BINDER ABD UNIV 12 45-62 (WOUND CARE) IMPLANT
BINDER ABDOMINAL 46IN 62IN (WOUND CARE)
BLADE SURG 10 STRL SS (BLADE) ×2 IMPLANT
BLADE SURG ROTATE 9660 (MISCELLANEOUS) IMPLANT
CANISTER SUCTION 2500CC (MISCELLANEOUS) IMPLANT
CHLORAPREP W/TINT 26ML (MISCELLANEOUS) ×2 IMPLANT
CLIP APPLIE 5 13 M/L LIGAMAX5 (MISCELLANEOUS) IMPLANT
CLIP APPLIE ROT 10 11.4 M/L (STAPLE) IMPLANT
COVER SURGICAL LIGHT HANDLE (MISCELLANEOUS) ×4 IMPLANT
DECANTER SPIKE VIAL GLASS SM (MISCELLANEOUS) IMPLANT
DEVICE SECURE STRAP 25 ABSORB (INSTRUMENTS) ×8 IMPLANT
DEVICE TROCAR PUNCTURE CLOSURE (ENDOMECHANICALS) IMPLANT
DRAIN CHANNEL 19F RND (DRAIN) ×4 IMPLANT
DRAPE LAPAROSCOPIC ABDOMINAL (DRAPES) ×2 IMPLANT
DRAPE WARM FLUID 44X44 (DRAPE) ×2 IMPLANT
DRSG OPSITE POSTOP 4X10 (GAUZE/BANDAGES/DRESSINGS) ×2 IMPLANT
DRSG TEGADERM 2-3/8X2-3/4 SM (GAUZE/BANDAGES/DRESSINGS) ×4 IMPLANT
ELECT CAUTERY BLADE 6.4 (BLADE) ×2 IMPLANT
ELECT REM PT RETURN 9FT ADLT (ELECTROSURGICAL) ×2
ELECTRODE REM PT RTRN 9FT ADLT (ELECTROSURGICAL) ×1 IMPLANT
EVACUATOR SILICONE 100CC (DRAIN) ×4 IMPLANT
GAUZE SPONGE 2X2 8PLY STRL LF (GAUZE/BANDAGES/DRESSINGS) ×1 IMPLANT
GLOVE BIOGEL M STRL SZ7.5 (GLOVE) IMPLANT
GLOVE BIOGEL PI IND STRL 6.5 (GLOVE) ×2 IMPLANT
GLOVE BIOGEL PI IND STRL 7.5 (GLOVE) ×2 IMPLANT
GLOVE BIOGEL PI IND STRL 8 (GLOVE) ×2 IMPLANT
GLOVE BIOGEL PI INDICATOR 6.5 (GLOVE) ×2
GLOVE BIOGEL PI INDICATOR 7.5 (GLOVE) ×2
GLOVE BIOGEL PI INDICATOR 8 (GLOVE) ×2
GLOVE SURG SS PI 6.5 STRL IVOR (GLOVE) ×10 IMPLANT
GLOVE SURG SS PI 7.0 STRL IVOR (GLOVE) ×2 IMPLANT
GLOVE SURG SS PI 7.5 STRL IVOR (GLOVE) ×6 IMPLANT
GOWN STRL REUS W/ TWL LRG LVL3 (GOWN DISPOSABLE) ×2 IMPLANT
GOWN STRL REUS W/ TWL XL LVL3 (GOWN DISPOSABLE) ×3 IMPLANT
GOWN STRL REUS W/TWL LRG LVL3 (GOWN DISPOSABLE) ×2
GOWN STRL REUS W/TWL XL LVL3 (GOWN DISPOSABLE) ×3
KIT BASIN OR (CUSTOM PROCEDURE TRAY) ×2 IMPLANT
KIT ROOM TURNOVER OR (KITS) ×2 IMPLANT
LIQUID BAND (GAUZE/BANDAGES/DRESSINGS) ×2 IMPLANT
MARKER SKIN DUAL TIP RULER LAB (MISCELLANEOUS) ×2 IMPLANT
MESH VENTRALIGHT ST 6X8 (Mesh Specialty) IMPLANT
MESH VENTRALIGHT ST 7X9N (Mesh General) ×2 IMPLANT
MESH VENTRLGHT ELLIPSE 8X6XMFL (Mesh Specialty) IMPLANT
NEEDLE HYPO 25GX1X1/2 BEV (NEEDLE) ×2 IMPLANT
NEEDLE SPNL 22GX3.5 QUINCKE BK (NEEDLE) ×2 IMPLANT
NS IRRIG 1000ML POUR BTL (IV SOLUTION) ×4 IMPLANT
PAD ARMBOARD 7.5X6 YLW CONV (MISCELLANEOUS) ×4 IMPLANT
PENCIL BUTTON HOLSTER BLD 10FT (ELECTRODE) ×2 IMPLANT
SCISSORS LAP 5X35 DISP (ENDOMECHANICALS) IMPLANT
SET IRRIG TUBING LAPAROSCOPIC (IRRIGATION / IRRIGATOR) IMPLANT
SLEEVE ENDOPATH XCEL 5M (ENDOMECHANICALS) ×4 IMPLANT
SPONGE GAUZE 2X2 STER 10/PKG (GAUZE/BANDAGES/DRESSINGS) ×1
SPONGE LAP 18X18 X RAY DECT (DISPOSABLE) ×4 IMPLANT
STAPLER VISISTAT 35W (STAPLE) ×2 IMPLANT
STRIP CLOSURE SKIN 1/2X4 (GAUZE/BANDAGES/DRESSINGS) ×2 IMPLANT
STRIP CLOSURE SKIN 1/4X4 (GAUZE/BANDAGES/DRESSINGS) ×2 IMPLANT
SUCTION POOLE TIP (SUCTIONS) ×2 IMPLANT
SUT ETHILON 3 0 PS 1 (SUTURE) ×4 IMPLANT
SUT MNCRL AB 4-0 PS2 18 (SUTURE) ×4 IMPLANT
SUT NOVA 1 T20/GS 25DT (SUTURE) ×8 IMPLANT
SUT NOVA NAB DX-16 0-1 5-0 T12 (SUTURE) IMPLANT
SUT PDS AB 1 TP1 96 (SUTURE) ×4 IMPLANT
SUT PROLENE 2 0 MH 48 (SUTURE) ×2 IMPLANT
SUT PROLENE 2 0 SH DA (SUTURE) ×2 IMPLANT
SUT VIC AB 2-0 CT1 27 (SUTURE) ×1
SUT VIC AB 2-0 CT1 TAPERPNT 27 (SUTURE) ×1 IMPLANT
SUT VIC AB 3-0 SH 18 (SUTURE) ×2 IMPLANT
SYR 20CC LL (SYRINGE) ×2 IMPLANT
SYR BULB IRRIGATION 50ML (SYRINGE) ×2 IMPLANT
SYR CONTROL 10ML LL (SYRINGE) ×2 IMPLANT
TIP RIGID 35CM EVICEL (HEMOSTASIS) ×2 IMPLANT
TOWEL OR 17X24 6PK STRL BLUE (TOWEL DISPOSABLE) ×4 IMPLANT
TOWEL OR 17X26 10 PK STRL BLUE (TOWEL DISPOSABLE) ×2 IMPLANT
TRAY FOLEY CATH 16FR SILVER (SET/KITS/TRAYS/PACK) IMPLANT
TRAY FOLEY METER SIL LF 16FR (CATHETERS) ×2 IMPLANT
TRAY LAPAROSCOPIC (CUSTOM PROCEDURE TRAY) ×2 IMPLANT
TROCAR XCEL BLUNT TIP 100MML (ENDOMECHANICALS) IMPLANT
TROCAR XCEL NON-BLD 11X100MML (ENDOMECHANICALS) ×2 IMPLANT
TROCAR XCEL NON-BLD 5MMX100MML (ENDOMECHANICALS) ×2 IMPLANT
TUBE CONNECTING 12X1/4 (SUCTIONS) ×2 IMPLANT
TUBING INSUFFLATION (TUBING) ×2 IMPLANT
YANKAUER SUCT BULB TIP NO VENT (SUCTIONS) ×2 IMPLANT

## 2015-03-11 NOTE — Progress Notes (Signed)
Received patient from PACU post hernia repair. Alert and oriented, not in any distress. Dressing intact, stained, abdominal binder on, on PCA  Pump, VSS, on O2 at 2LPM.

## 2015-03-11 NOTE — Anesthesia Postprocedure Evaluation (Signed)
  Anesthesia Post-op Note  Patient: [name]  Procedure(s) Performed: Procedure(s): LAPAROSCOPIC ASSISTED VENTRAL INCISIONAL  HERNIA REPAIR POSSIBLE OPEN (N/A) INSERTION OF MESH (N/A) OPEN VENTRAL INCISIONAL HERNIA REPAIR ADULT (N/A)  Patient Location: PACU  Anesthesia Type:General  Level of Consciousness: awake  Airway and Oxygen Therapy: Patient Spontanous Breathing  Post-op Pain: mild  Post-op Assessment: Post-op Vital signs reviewed  Post-op Vital Signs: Reviewed  Last Vitals:  Filed Vitals:   03/11/15 0729  BP: 135/53  Pulse: 53  Temp: 36.4 C  Resp: 20    Complications: No apparent anesthesia complications

## 2015-03-11 NOTE — Anesthesia Procedure Notes (Signed)
Procedure Name: Intubation Date/Time: 03/11/2015 9:52 AM Performed by: Clearnce Sorrel Pre-anesthesia Checklist: Patient identified, Timeout performed, Emergency Drugs available, Suction available and Patient being monitored Patient Re-evaluated:Patient Re-evaluated prior to inductionOxygen Delivery Method: Circle system utilized Preoxygenation: Pre-oxygenation with 100% oxygen Intubation Type: IV induction Laryngoscope Size: Mac and 3 Grade View: Grade III Tube type: Oral Tube size: 7.0 mm Number of attempts: 1 Placement Confirmation: positive ETCO2 and breath sounds checked- equal and bilateral Secured at: 23 cm Tube secured with: Tape Dental Injury: Teeth and Oropharynx as per pre-operative assessment

## 2015-03-11 NOTE — Interval H&P Note (Signed)
History and Physical Interval Note:  03/11/2015 9:05 AM  [name]  has presented today for surgery, with the diagnosis of ventral incisional hernia  The various methods of treatment have been discussed with the patient and family. After consideration of risks, benefits and other options for treatment, the patient has consented to  Procedure(s): Blain OPEN (N/A) INSERTION OF MESH (N/A) as a surgical intervention .  The patient's history has been reviewed, patient examined, no change in status, stable for surgery.  I have reviewed the patient's chart and labs.  Questions were answered to the patient's satisfaction.    [name]. [name], MD, [place] B[facility] Minimally Invasive Surgery [facility], [place]   [facility]

## 2015-03-11 NOTE — Brief Op Note (Signed)
03/11/2015  3:52 PM  PATIENT:  [name]  69 y.o. female  PRE-OPERATIVE DIAGNOSIS:  ventral incisional hernia  POST-OPERATIVE DIAGNOSIS:  ventral incisional hernia  PROCEDURE:  Procedure(s): LAPAROSCOPIC ASSISTED VENTRAL INCISIONAL  HERNIA REPAIR (N/A) INSERTION OF MESH (N/A) LYSIS OF ADHESIONS x 1 hr POSTERIOR COMPONENT SEPARATION (Transversus Abdominis Release) on Right Abdominal Wall  SURGEON:  Surgeon(s) and Role:    * Greer Pickerel, MD - Primary  PHYSICIAN ASSISTANT:   ASSISTANTS: Judyann Munson RNFA   ANESTHESIA:   general  EBL:  Total I/O In: 1250 [I.V.:1000; IV Piggyback:250] Out: 340 [Urine:290; Blood:50]  BLOOD ADMINISTERED:none  DRAINS: Nasogastric Tube and Urinary Catheter (Foley)   LOCAL MEDICATIONS USED:  OTHER exparel  Type of repair - primary suture with mesh underlay with posterior component release on right  (choices - primary suture, mesh, or component)  Name of mesh - Bard VentralightST   Size of mesh - Length 9 in , Width 7 in  Mesh overlap - 8 cm  Placement of mesh - beneath fascia and into peritoneal cavity  (choices - beneath fascia and into peritoneal cavity, beneath fascia but external to peritoneal cavity, between the muscle and fascia, above or external to fascia)    SPECIMEN:  No Specimen  DISPOSITION OF SPECIMEN:  N/A  COUNTS:  YES  TOURNIQUET:  * No tourniquets in log *  DICTATION: .Other Dictation: Dictation Number [id number]  PLAN OF CARE: Admit to inpatient   PATIENT DISPOSITION:  PACU - hemodynamically stable.   Delay start of Pharmacological VTE agent (>24hrs) due to surgical blood loss or risk of bleeding: no  [name]. [name], MD, FACS General, Bariatric, & Minimally Invasive Surgery [facility], [place]

## 2015-03-11 NOTE — Transfer of Care (Signed)
Immediate Anesthesia Transfer of Care Note  Patient: [name]  Procedure(s) Performed: Procedure(s): LAPAROSCOPIC ASSISTED VENTRAL INCISIONAL  HERNIA REPAIR POSSIBLE OPEN (N/A) INSERTION OF MESH (N/A) OPEN VENTRAL INCISIONAL HERNIA REPAIR ADULT (N/A)  Patient Location: PACU  Anesthesia Type:General  Level of Consciousness: awake, alert  and oriented  Airway & Oxygen Therapy: Patient Spontanous Breathing and Patient connected to nasal cannula oxygen  Post-op Assessment: Report given to RN and Post -op Vital signs reviewed and stable  Post vital signs: Reviewed and stable  Last Vitals:  Filed Vitals:   03/11/15 0729  BP: 135/53  Pulse: 53  Temp: 36.4 C  Resp: 20    Complications: No apparent anesthesia complications

## 2015-03-11 NOTE — Anesthesia Preprocedure Evaluation (Addendum)
Anesthesia Evaluation  Patient identified by MRN, date of birth, ID band Patient awake    Reviewed: Allergy & Precautions, H&P , NPO status , Patient's Chart, lab work & pertinent test results  Airway Mallampati: II  TM Distance: >3 FB Neck ROM: Full    Dental no notable dental hx. (+) Teeth Intact, Missing, Dental Advisory Given   Pulmonary neg pulmonary ROS, shortness of breath, former smoker,  breath sounds clear to auscultation  Pulmonary exam normal       Cardiovascular hypertension, Pt. on medications and Pt. on home beta blockers + CAD, + Past MI, + CABG and +CHF + dysrhythmias Atrial Fibrillation Rhythm:Regular Rate:Normal  Paroxysmal atrial fibrillation, maintaining SR on coumadin  01/27/2013    Neuro/Psych Seizures -,  negative neurological ROS  negative psych ROS   GI/Hepatic negative GI ROS, Neg liver ROS, Prev abd surgeries   Endo/Other  negative endocrine ROS  Renal/GU      Musculoskeletal  (+) Arthritis -,   Abdominal   Peds  Hematology negative hematology ROS (+) anemia ,   Anesthesia Other Findings   Reproductive/Obstetrics negative OB ROS                           Anesthesia Physical Anesthesia Plan  ASA: III  Anesthesia Plan: General   Post-op Pain Management:    Induction: Intravenous  Airway Management Planned: Oral ETT  Additional Equipment:   Intra-op Plan:   Post-operative Plan: Extubation in OR  Informed Consent: I have reviewed the patients History and Physical, chart, labs and discussed the procedure including the risks, benefits and alternatives for the proposed anesthesia with the patient or authorized representative who has indicated his/her understanding and acceptance.     Plan Discussed with: CRNA, Surgeon and Anesthesiologist  Anesthesia Plan Comments:        Anesthesia Quick Evaluation

## 2015-03-11 NOTE — H&P (Signed)
[name]. [name] 03/10/2015 10:45 AM Location: [facility] Patient #: [id number] DOB: 07/27/1946 Divorced / Language: [name] / Race: White Female History of Present Illness Bridget Hiss M. Tikia Skilton MD; 03/11/2015 8:59 AM) Patient words: eval hernias.  The patient is a 69 year old female who presents for a preoperative evaluation. 69 yo WF comes in day before surgery to answer last minute questions regarding incisional hernia repair with mesh scheduled for tomorrow. She denies any fever, chills, nausea, vomiting, diarrhea or constipation. she denies any CP, SOB, DOE, orthopnea, TIA/Amaruosis fugax. she stopped her coumadin. she has receieved cardiology clearance. she is accompanied by her daughter. she is getting her PICC line later today due to difficult pIV access. she denies any changes since last seen. she c/o the bulge in her abdomen - the appearance and some mild discomfort. Problem List/Past Medical Bridget Hiss Bridget Derby, MD; 03/11/2015 9:02 AM) POSTOP CHECK (V67.00  Z09) CONSTIPATION, UNSPECIFIED CONSTIPATION TYPE (564.00  K59.00) INCISIONAL HERNIA, WITHOUT OBSTRUCTION OR GANGRENE (553.21  K43.2) ANTICOAGULATED ON COUMADIN (V58.83  Z51.81) SJOGREN'S DISEASE (710.2  M35.00) ATRIAL FIBRILLATION, UNSPECIFIED TYPE (427.31  I48.91) HISTORY OF PARTIAL COLECTOMY (V45.72  Z90.49) [name] procedure for perforated diverticulitis 12/28/13 Colostomy reversal with completion Left colectomy, LOA 06/29/2014  Other Problems Bridget Curry, MD; 03/11/2015 9:02 AM) High blood pressure Chest pain Back Pain Diverticulosis Heart murmur Myocardial infarction Hypercholesterolemia Other disease, cancer, significant illness  Past Surgical History Bridget Curry, MD; 03/11/2015 9:02 AM) Coronary Artery Bypass Graft Colon Polyp Removal - Colonoscopy Appendectomy  Diagnostic Studies History Bridget Curry, MD; 03/11/2015 9:02 AM) Colonoscopy within last year Pap Smear 1-5 years  ago Mammogram within last year  Allergies (Ammie Eversole, LPN; 02/03/4009 27:25 AM) Latex Exam Gloves *MEDICAL DEVICES* Rash. Promethazine HCl *ANTIHISTAMINES* Phenergan *ANTIHISTAMINES* Sulfa 10 *OPHTHALMIC AGENTS* Rash, Photosensitivity. Crestor *ANTIHYPERLIPIDEMICS* Penicillin G Potassium *PENICILLINS* Lipitor *ANTIHYPERLIPIDEMICS* Diltiazem HCl *CHEMICALS*  Medication History (Ammie Eversole, LPN; 3/66/4403 47:42 AM) Aspirin EC ('81MG'$  Tablet DR, Oral) Active. Calcium Carb-Cholecalciferol (600-'400MG'$ -UNIT Tablet, Oral) Active. Cholecalciferol Active. Cilostazol ('100MG'$  Tablet, Oral) Active. Ezetimibe ('10MG'$  Tablet, Oral) Active. Hydrocortisone Ace-Pramoxine (2.5-1% Cream, External) Active. Isosorbide Dinitrate ('30MG'$  Tablet, Oral) Active. Lisinopril ('20MG'$  Tablet, Oral) Active. Metoprolol Succinate ER ('50MG'$  Tablet ER 24HR, Oral) Active. Nitroglycerin (0.'4MG'$ Dorita Fray Solution, Translingual) Active. Omega 3 ('1000MG'$  Capsule, Oral) Active. B6 Natural ('100MG'$  Tablet, Oral) Active. Simvastatin ('20MG'$  Tablet, Oral) Active. Coumadin (2.'5MG'$  Tablet, Oral) Active. Valtrex (1GM Tablet, prn Oral) Active. MiraLax (Oral prn) Active. Medications Reconciled  Social History Bridget Curry, MD; 03/11/2015 9:02 AM) No drug use Tobacco use Former smoker. Caffeine use Coffee, Tea. Alcohol use Moderate alcohol use.  Family History Bridget Curry, MD; 03/11/2015 9:02 AM) Alcohol Abuse Father. Diabetes Mellitus Brother, Father, Sister. Cancer Brother. Colon Cancer Brother. Cerebrovascular Accident Family Members In General, Sister. Colon Polyps Sister. Ischemic Bowel Disease Mother. Respiratory Condition Family Members In General. Heart disease in female family member before age 17 Rectal Cancer Brother. Migraine Headache Son. Hypertension Daughter, Mother. Melanoma Family Members In General.  Pregnancy / Birth History Bridget Curry, MD; 03/11/2015 9:02  AM) Maternal age 1-25 Contraceptive History Oral contraceptives. Age of menopause 24-50 Age at menarche 37 years. Irregular periods Gravida 2 Para 2     Review of Systems Bridget Hiss Bridget Derby, MD; 03/11/2015 8:57 00) General Not Present- Appetite Loss, Chills, Fatigue, Fever, Night Sweats, Weight Gain and Weight Loss. Skin Not Present- Change in Wart/Mole, Dryness, Hives, Jaundice, New Lesions, Non-Healing Wounds, Rash and Ulcer. HEENT Present- Wears glasses/contact lenses. Not  Present- Earache, Hearing Loss, Hoarseness, Nose Bleed, Oral Ulcers, Ringing in the Ears, Seasonal Allergies, Sinus Pain, Sore Throat, Visual Disturbances and Yellow Eyes. Breast Not Present- Breast Mass, Breast Pain, Nipple Discharge and Skin Changes. Cardiovascular Not Present- Chest Pain, Difficulty Breathing Lying Down, Leg Cramps, Palpitations, Rapid Heart Rate, Shortness of Breath and Swelling of Extremities. Gastrointestinal Not Present- Abdominal Pain, Bloating, Bloody Stool, Change in Bowel Habits, Chronic diarrhea, Constipation, Difficulty Swallowing, Excessive gas, Gets full quickly at meals, Hemorrhoids, Indigestion, Nausea, Rectal Pain and Vomiting. Female Genitourinary Not Present- Frequency, Nocturia, Painful Urination, Pelvic Pain and Urgency. Musculoskeletal Present- Back Pain and Muscle Weakness. Not Present- Joint Pain, Joint Stiffness, Muscle Pain and Swelling of Extremities. Neurological Not Present- Decreased Memory, Fainting, Headaches, Numbness, Seizures, Tingling, Tremor, Trouble walking and Weakness. Psychiatric Not Present- Anxiety, Bipolar, Change in Sleep Pattern, Depression, Fearful and Frequent crying. Endocrine Not Present- Cold Intolerance, Excessive Hunger, Hair Changes, Heat Intolerance, Hot flashes and New Diabetes. Hematology Present- Easy Bruising and Excessive bleeding. Not Present- Gland problems, HIV and Persistent Infections.  Vitals (Ammie Eversole LPN; 02/09/5187 41:66  AM) 03/10/2015 11:05 AM Weight: 145 lb Height: 62.5in Body Surface Area: 1.7 m Body Mass Index: 26.1 kg/m Temp.: 97.5F(Oral)  Pulse: 80 (Regular)  BP: 122/74 (Sitting, Left Arm, Standard)     Physical Exam Bridget Hiss M. Orly Quimby MD; 03/11/2015 8:56 AM)  General Mental Status-Alert. General Appearance-Consistent with stated age. Hydration-Well hydrated. Voice-Normal.  Head and Neck Head-normocephalic, atraumatic with no lesions or palpable masses. Trachea-midline. Thyroid Gland Characteristics - normal size and consistency.  Eye Eyeball - Bilateral-Extraocular movements intact. Sclera/Conjunctiva - Bilateral-No scleral icterus.  Chest and Lung Exam Chest and lung exam reveals -quiet, even and easy respiratory effort with no use of accessory muscles and on auscultation, normal breath sounds, no adventitious sounds and normal vocal resonance. Inspection Chest Wall - Normal. Back - normal.  Breast - Did not examine.  Cardiovascular Cardiovascular examination reveals -normal heart sounds, regular rate and rhythm with no murmurs and normal pedal pulses bilaterally.  Abdomen Inspection Skin - Scar - Note: multiple old surgical scars. Palpation/Percussion Palpation and Percussion of the abdomen reveal - Soft, Non Tender, No Rebound tenderness, No Rigidity (guarding) and No hepatosplenomegaly. Auscultation Auscultation of the abdomen reveals - Bowel sounds normal. Note: palpable fascial defect at apex of lower midline incision off to the right. feels about 2cm. has bulge on right side of abd along midline consistent with diastasis, also appears to have periumbilical incisional hernia as well, defect about 4cm   Peripheral Vascular Upper Extremity Palpation - Pulses bilaterally normal.  Neurologic Neurologic evaluation reveals -alert and oriented x 3 with no impairment of recent or remote memory. Mental  Status-Normal.  Neuropsychiatric The patient's mood and affect are described as -normal. Judgment and Insight-insight is appropriate concerning matters relevant to self.  Musculoskeletal Normal Exam - Left-Upper Extremity Strength Normal and Lower Extremity Strength Normal. Normal Exam - Right-Upper Extremity Strength Normal and Lower Extremity Strength Normal.  Lymphatic Head & Neck  General Head & Neck Lymphatics: Bilateral - Description - Normal. Axillary - Did not examine. Femoral & Inguinal - Did not examine.    Assessment & Plan Bridget Hiss M. Salma Walrond MD; 03/11/2015 9:04 AM)  Fatima Blank HERNIA, WITHOUT OBSTRUCTION OR GANGRENE (553.21  K43.2) Impression: More than 90% of the appointment was spent re-discussing hernia repair. She was shown educational material.She has 2 incisional hernias. One more superior is the larger one measuring about 4 cm vertical by about 5 cm wide. The  smaller one is around the umbilicus measuring about 1-1/2 cm long by 2 cm wide. There is only about 1 cm normal fascia between the 2 hernias. We discussed what her goal with surgery would be. Her primary goals are to prevent hernia recurrence as well as to minimize the bulge in her abdominal wall. Based on this I recommended either appear open repair or a laparoscopic assisted incisional hernia repair with primary closure of the fascial defect with mesh underlay depending on intraop findings. We discussed the placement of mesh in different layers of the abdominal wall or within side the abdomen. We discussed that with respect to pain and discomfort they are all similar. She understands that she will have some form of midline incision with probable drain placement. I answered all of her questions regarding mesh and hernia repair and postop care. We have thoroughly discussed the risks and benefits now on 2 separate questions. I reminded her that I'm on vacation and that my partners would be rounding on her on Friday  and the weekend. She was informed of this when she scheduled surgery.  Current Plans Pt Education - [employer] Free Text Education/Instructions: discussed with patient and provided information. ATRIAL FIBRILLATION, UNSPECIFIED TYPE (427.31  I48.91)  HISTORY OF PARTIAL COLECTOMY (V45.72  Z90.49) Story: [place] procedure for perforated diverticulitis 12/28/13 Colostomy reversal with completion Left colectomy, LOA 06/29/2014 Impression: no evidence of diverticulitis  SJOGREN'S DISEASE (710.2  M35.00) Impression: We did discuss the prednisone usage does increase her risk of hernia recurrence  [name]. [name], MD, FACS General, Bariatric, & Minimally Invasive Surgery [facility], [place]

## 2015-03-12 ENCOUNTER — Encounter (HOSPITAL_COMMUNITY): Payer: Self-pay | Admitting: General Practice

## 2015-03-12 LAB — BASIC METABOLIC PANEL
Anion gap: 7 (ref 5–15)
BUN: 7 mg/dL (ref 6–23)
CO2: 24 mmol/L (ref 19–32)
Calcium: 7.4 mg/dL — ABNORMAL LOW (ref 8.4–10.5)
Chloride: 104 mmol/L (ref 96–112)
Creatinine, Ser: 0.74 mg/dL (ref 0.50–1.10)
GFR calc Af Amer: >90 mL/min (ref >90)
GFR calc non Af Amer: 84 mL/min — ABNORMAL LOW (ref >90)
Glucose, Bld: 136 mg/dL — ABNORMAL HIGH (ref 70–99)
Potassium: 4.3 mmol/L (ref 3.5–5.1)
Sodium: 135 mmol/L (ref 135–145)

## 2015-03-12 LAB — CBC
HCT: 36.6 % (ref 36.0–46.0)
Hemoglobin: 12.1 g/dL (ref 12.0–15.0)
MCH: 30.2 pg (ref 26.0–34.0)
MCHC: 33.1 g/dL (ref 30.0–36.0)
MCV: 91.3 fL (ref 78.0–100.0)
Platelets: 178 10*3/uL (ref 150–400)
RBC: 4.01 MIL/uL (ref 3.87–5.11)
RDW: 14.5 % (ref 11.5–15.5)
WBC: 9.9 10*3/uL (ref 4.0–10.5)

## 2015-03-12 MED ORDER — SODIUM CHLORIDE 0.9 % IJ SOLN
10.0000 mL | INTRAMUSCULAR | Status: DC | PRN
  Administered 2015-03-12 – 2015-03-16 (×4): 10 mL
  Filled 2015-03-12 (×3): qty 40

## 2015-03-12 MED ORDER — SODIUM CHLORIDE 0.9 % IJ SOLN
10.0000 mL | Freq: Two times a day (BID) | INTRAMUSCULAR | Status: DC
  Administered 2015-03-12 – 2015-03-16 (×5): 10 mL

## 2015-03-12 MED ORDER — TAMSULOSIN HCL 0.4 MG PO CAPS
0.4000 mg | ORAL_CAPSULE | Freq: Every day | ORAL | Status: DC
  Administered 2015-03-13 – 2015-03-18 (×6): 0.4 mg via ORAL
  Filled 2015-03-12 (×8): qty 1

## 2015-03-12 NOTE — Progress Notes (Signed)
MD made aware of urinary retention. Bladder scan shows mote than 700.Foley reinserted.

## 2015-03-12 NOTE — Op Note (Signed)
[name], [name] NO.:  [id number]  MEDICAL RECORD NO.:  [id number]  LOCATION:  6N04C                        FACILITY:  [facility]  PHYSICIAN:  [name]. [name], MD, FACSDATE OF BIRTH:  15-Dec-1945  DATE OF PROCEDURE:  03/11/2015 DATE OF DISCHARGE:                              OPERATIVE REPORT   PREOPERATIVE DIAGNOSIS:  Ventral incisional hernia.  POSTOPERATIVE DIAGNOSIS:  Ventral incisional hernia.  PROCEDURE:  Laparoscopic-assisted ventral incisional hernia repair with open primary repair with  Mesh underlay and right posterior component separation; lysis of adhesions x 1 hour  SURGEON:  [name]. [name], MD, FACS  ANESTHESIA:  General.  EBL:  50 mL.  ASSISTANT:  Judyann Munson, RNFA.  INDICATIONS FOR PROCEDURE:  The patient is a pleasant 69 year old female, who I initially met a few years ago as an emergent consult with perforated sigmoid diverticulitis.  She underwent a Hartmann's procedure.  Several months thereafter, she underwent takedown of her end- colostomy with completion left colectomy with hand-sewn end-to-end anastomosis through a midline incision.  This was back in the late summer of last year.  Unfortunately, she developed incisional hernia. She had 2 incisional hernias, 1 periumbilically and 1 a little bit inferior to the umbilicus.  There is only about 1 cm of normal fascia between the 2.  On imaging, the more superior hernia measured 4 cm vertical by 5 cm wide and inferior hernia measured 1.5 cm long by 2 cm wide.  She and I had a prolonged discussion on more than one office visit regarding management.  She desired reapproximation of her abdominal muscles as well as prevention of incarceration and strangulation.  We discussed that this would require mesh insertion.  We discussed the risks and benefits of surgery including, but not limited to, bleeding, infection, injury to surrounding structures, need to convert to an open procedure, mesh  complications, hematoma formation, seroma formation, perioperative cardiac and pulmonary events, postoperative ileus, prolonged hospitalization, blood clot formation. She received cardiac clearance preoperatively.  Her Coumadin was held. We discussed after discussing pros and cons of open versus laparoscopic repair that we would probably attempt some hybrid repair of reapproximating her muscles open and fixating the mesh perhaps laparoscopically.  We discussed that it would be an intraoperative decision based on the condition of her muscle layers.  DESCRIPTION OF PROCEDURE:  Preoperatively, the patient received a PICC Line for surgery due to poor peripheral IV access.  The morning of surgery, she was identified and informed consent had been obtained. She was taken OR 12 at [facility], placed supine on the operating room table.  Her left arm was tucked with appropriate padding. Sequential compression devices were placed.  The Foley catheter was placed.  Surgical time-out was performed.  She received IV vancomycin prior to skin incision due to her numerous antibiotic allergies. Because of her multiple laparotomies, I decided essentially just go in open.  I excised her old midline incision.  I did enter the fascia in the upper midline where there was no prior scar, there were some thin filmy adhesions to her anterior abdominal wall.  I continued to open up the fascia in a sequential fashion taking down  these anterior abdominal wall adhesions with a pair of Metzenbaum scissors.  She then had adhesions to her left and right abdominal wall again with thin filmy adhesions.  Overall it took about an hour to free the adhesions from the abdominal wall using a pair of Metzenbaum scissors.  There were few interloop adhesions, which were taken down as well.  I re-ran the bowel, there was no evidence of injury to the bowel.  The hernia defect seen on the CT scan corresponded to the  intraabdominal findings.  Initially, I planned at this point to do a retrorectus repair with possible TAR on the right.  Kocher's were placed along the fascial edge on the right.  I then incised the posterior rectus sheath and identified the posterior layer of the rectus muscle.  I then took down the posterior rectus sheath until I identified the perforating neurovascular bundle along the right side of the abdominal wall.  I then turned my attention to the left side of the abdomen, incised the posterior rectus sheath, however, this was more stuck in several locations probably due to her prior colostomy on this side.  I was able to get to the perforating neurovascular bundle on the left, however, it was not as extensive mobilization as on the right.  I did not have enough mobilization to reapproximate the posterior fascia, therefore, I decided to do TAR on the right side of the abdomen.  Just medial to the perforating vessels in the posterior rectus sheath, I incised the transversalis abdominis muscle along the entire length and clearly entered the appropriate plane.  I was able to lift it up and mobilize a fair amount of it all the way laterally, this was completely intact.  At the apex of the incision, I stayed preperitoneal. however, in the right upper mid abdomen, her posterior fascia was very attenuated.  At the inferior aspect of the incision, I stayed preperitoneal and did not have to go into the space of Retzius.  At this point, I achieved enough mobilization on the right side to reapproximate the posterior fascia.  I closed the posterior fascia with a running 2-0 Prolene, one from above, one from below.  However, the patient's muscle relaxant got light, and there was increased intraabdominal pressure and the posterior rectus fascia in the upper midline separated, the defect was too large for me to reapproximate with Vicryl sutures.  At this point, I felt the safest thing to do was  to place the mesh intra-abdominally beneath the peritoneum.  I felt if I tried to place the mesh on top of the posterior rectus fascia that bowel could get trapped between the mesh in the posterior rectus muscle.  The majority of the posterior rectus fascia was closure was intact, there was just a gap in the upper midline.  At this point, I placed some Evicel on top of the posterior rectus fascia. I then reapproximated the anterior fascia with #1 looped PDS, one from above and one from below.  Prior to closing the anterior fascia, I did place a 5-mm trocar in the lateral left upper quadrant under direct visualization.  I then established pneumoperitoneum and inserted the laparoscope.  As expected, we did have good pneumoperitoneum, some of the air was going free into the preperitoneal space collapsing some of the field intra-abdominally.  However, I had enough working room.  In measuring for an 8 cm overlap, I selected a 7 x 9 inch elliptical Ventralight ST mesh.  I placed  8 #1 Novafil sutures in a circumferential manner around the edges of the mesh leaving long tails.  The mesh was then marked to note the apex of the mesh and the inferior aspect of the mesh.  It was then rolled like a cigar and placed in the abdomen through an 11 mm trocar that I had placed under direct visualization.  The mesh was uncurled and oriented properly so that the previously placed markings on the mesh runs the corresponding abdominal wall locations. Then using an EndoClose, I circumferentially brought up each of the transfascial sutures through a single skin incision, but through different fascial planes.  I started at the apex and then brought it up at the inferior aspect to make sure there was no redundancy in the mesh. We then brought up the right-sided transfascial sutures.  We then brought up the left-sided transfascial sutures.  The EndoClose was then placed through the abdominal wall under direct  visualization for a re- transfascial sutures.  We then brought up the sutures and confirmed that there was little to no redundancy in the mesh against the abdominal wall.  Prior to tying them down, there was this defect in the peritoneum through the upper abdomen on the right side.  I did not leave a drain in this space because I felt that I would run a risk of tacking the mesh through a drain, therefore, I obtained additional Evicel and sprayed it into this preperitoneal space that I could visualize laparoscopically in order to help with hemostasis postoperatively as well as to prevent a seroma or hematoma from forming.  It was sprayed in this preperitoneal space.  We then brought up all the transfascial sutures and tied them in a sequential manner.  The mesh was well anchored.  It was flushed against the abdominal wall.  I then tacked the mesh to the abdominal wall using an Ethicon SecureStrap Tacker at 1 cm intervals in a circumferential manner.  In order to tack the left upper quadrant, I did place two 5 mm trocars in the right lateral abdomen under direct visualization.  I did place an inner crown tacks as well.  The mesh was well flushed against the abdominal wall.  There was little to no redundancy.  There was no evidence of injury to the bowel.  It should be noted that prior to closing the anterior fascia, I did inject Exparel in a sequential fashion in the abdominal wall muscle and subcutaneous tissue.  Pneumoperitoneum was released.  Four trocars were removed.  The trocar sites were closed with skin staples.  The subcutaneous tissue in the midline was irrigated and the midline was reapproximated with skin staples.  The transfascial suture sites were closed with benzoin and Steri-Strips.  Brown Band-Aids were placed over the trocar sites and honeycomb dressing was placed over the midline incision.  The patient tolerated this procedure well.  All needle, instrument, and  sponge counts were correct x2.  There were no immediate complications.  The patient tolerated this procedure well.     [name]. [name], MD, FACS     EMW/MEDQ  D:  03/11/2015  T:  03/12/2015  Job:  [id number]

## 2015-03-12 NOTE — Progress Notes (Signed)
1 Day Post-Op  Subjective: Awake and alert Sore, but not too bad per her report No flatus, denies nausea  Objective: Vital signs in last 24 hours: Temp:  [97.4 F (36.3 C)-98.4 F (36.9 C)] 98.2 F (36.8 C) (04/29 0457) Pulse Rate:  [53-77] 68 (04/29 0630) Resp:  [9-22] 18 (04/29 0457) BP: (86-135)/(39-72) 110/48 mmHg (04/29 0630) SpO2:  [90 %-100 %] 99 % (04/29 0630) Weight:  [63.05 kg (139 lb)] 63.05 kg (139 lb) (04/28 0729) Last BM Date: 03/11/15  Intake/Output from previous day: 04/28 0701 - 04/29 0700 In: 2945 [P.O.:840; I.V.:1700; IV Piggyback:405] Out: 520 [Urine:470; Blood:50] Intake/Output this shift: Total I/O In: 840 [P.O.:840] Out: -   Lungs clear Abdomen with binder in place  Lab Results:  No results for input(s): WBC, HGB, HCT, PLT in the last 72 hours. BMET No results for input(s): NA, K, CL, CO2, GLUCOSE, BUN, CREATININE, CALCIUM in the last 72 hours. PT/INR  Recent Labs  03/11/15 0805  LABPROT 15.9*  INR 1.26   ABG No results for input(s): PHART, HCO3 in the last 72 hours.  Invalid input(s): PCO2, PO2  Studies/Results: Ir US Guide Vasc Access Right  03/10/2015   CLINICAL DATA:  POOR VENOUS ACCESS, PREOPERATIVE access for ventral hernia repair tomorrow  EXAM: POWER PICC LINE PLACEMENT WITH ULTRASOUND AND FLUOROSCOPIC GUIDANCE  FLUOROSCOPY TIME:  30 seconds  PROCEDURE: The patient was advised of the possible risks andcomplications and agreed to undergo the procedure. The patient was then brought to the angiographic suite for the procedure.  The right arm was prepped with chlorhexidine, drapedin the usual sterile fashion using maximum barrier technique (cap and mask, sterile gown, sterile gloves, large sterile sheet, hand hygiene and cutaneous antisepsis) and infiltrated locally with 1% Lidocaine.  Ultrasound demonstrated patency of the right basilic vein, and this was documented with an image. Under real-time ultrasound guidance, this vein was accessed  with a 21 gauge micropuncture needle and image documentation was performed. A 0.018 wire was introduced in to the vein. Over this, a 5 [place] double lumen power PICC was advanced to the lower SVC/right atrial junction. Fluoroscopy during the procedure and fluoro spot radiograph confirms appropriate catheter position. The catheter was flushed and covered with asterile dressing.  Catheter length: 47  Complications: None immediate  IMPRESSION: Successful right arm power PICC line placement with ultrasound and fluoroscopic guidance. The catheter is ready for use.   Electronically Signed   By: Jerilynn Mages.  Shick M.D.   On: 03/10/2015 13:22   Ir Fluoro Guide Cv Midline Picc Right  03/10/2015   CLINICAL DATA:  POOR VENOUS ACCESS, PREOPERATIVE access for ventral hernia repair tomorrow  EXAM: POWER PICC LINE PLACEMENT WITH ULTRASOUND AND FLUOROSCOPIC GUIDANCE  FLUOROSCOPY TIME:  30 seconds  PROCEDURE: The patient was advised of the possible risks andcomplications and agreed to undergo the procedure. The patient was then brought to the angiographic suite for the procedure.  The right arm was prepped with chlorhexidine, drapedin the usual sterile fashion using maximum barrier technique (cap and mask, sterile gown, sterile gloves, large sterile sheet, hand hygiene and cutaneous antisepsis) and infiltrated locally with 1% Lidocaine.  Ultrasound demonstrated patency of the right basilic vein, and this was documented with an image. Under real-time ultrasound guidance, this vein was accessed with a 21 gauge micropuncture needle and image documentation was performed. A 0.018 wire was introduced in to the vein. Over this, a 5 [place] double lumen power PICC was advanced to the lower SVC/right atrial junction.  Fluoroscopy during the procedure and fluoro spot radiograph confirms appropriate catheter position. The catheter was flushed and covered with asterile dressing.  Catheter length: 47  Complications: None immediate  IMPRESSION:  Successful right arm power PICC line placement with ultrasound and fluoroscopic guidance. The catheter is ready for use.   Electronically Signed   By: Jerilynn Mages.  Shick M.D.   On: 03/10/2015 13:22    Anti-infectives: Anti-infectives    Start     Dose/Rate Route Frequency Ordered Stop   03/11/15 0830  vancomycin (VANCOCIN) IVPB 1000 mg/200 mL premix  Status:  Discontinued     1,000 mg 200 mL/hr over 60 Minutes Intravenous To Surgery 03/10/15 1344 03/11/15 1830      Assessment/Plan: s/p Procedure(s): LAPAROSCOPIC ASSISTED VENTRAL INCISIONAL  HERNIA REPAIR POSSIBLE OPEN (N/A) INSERTION OF MESH (N/A) OPEN VENTRAL INCISIONAL HERNIA REPAIR ADULT (N/A)  POD#1 Out of bed Incentive spirometer Pain control   LOS: 1 day    Shah Insley A 03/12/2015

## 2015-03-13 MED ORDER — BETHANECHOL CHLORIDE 25 MG PO TABS
25.0000 mg | ORAL_TABLET | Freq: Four times a day (QID) | ORAL | Status: DC
Start: 2015-03-13 — End: 2015-03-17
  Administered 2015-03-13 (×3): 25 mg via ORAL
  Filled 2015-03-13 (×5): qty 1

## 2015-03-13 NOTE — Progress Notes (Signed)
Patient refused the Bethanechol, because she said it was causing very sharp pains. Educated patient, but she still refused.

## 2015-03-13 NOTE — Progress Notes (Signed)
Patient ID: [name], female   DOB: 05-01-1946, 69 y.o.   MRN: [id number]   LOS: 2 days   POD#2  Subjective: Nausea yesterday but better this morning. Denies flatus. Pain controlled with PCA. Had urinary retention and had to have foley replaced yesterday.   Objective: Vital signs in last 24 hours: Temp:  [97.9 F (36.6 C)-99.7 F (37.6 C)] 99.3 F (37.4 C) (04/30 0627) Pulse Rate:  [65-69] 66 (04/30 0627) Resp:  [16-24] 21 (04/30 0759) BP: (99-117)/(46-52) 117/51 mmHg (04/30 0627) SpO2:  [96 %-99 %] 98 % (04/30 0759) Weight:  [63.05 kg (139 lb)] 63.05 kg (139 lb) (04/30 0759) Last BM Date: 03/11/15   Physical Exam General appearance: alert and no distress Resp: clear to auscultation bilaterally Cardio: regular rate and rhythm GI: Soft, +BS, incision C/D/I   Assessment/Plan: S/P lap VHR -- Continues with ileus but +BS encouraging. Continue clears, PCA. Encouraged ambulation. Urinary retention -- Likely narcotic induced. On Flomax, will give urecholine, plan voiding trial tomorrow.    Lisette Abu, PA-C Pager: [phone] 03/13/2015

## 2015-03-14 NOTE — Progress Notes (Signed)
Patient ID: [name], female   DOB: 1946-08-25, 69 y.o.   MRN: [id number] [facility] Surgery Progress Note:   3 Days Post-Op  Subjective: Mental status is clear.  Had sharp pains yesterday that were likely associated with fixation sutures in the left upper quadrant Objective: Vital signs in last 24 hours: Temp:  [98 F (36.7 C)-98.7 F (37.1 C)] 98.3 F (36.8 C) (05/01 0553) Pulse Rate:  [63-73] 64 (05/01 0553) Resp:  [12-22] 17 (05/01 0810) BP: (95-126)/(43-58) 107/50 mmHg (05/01 0553) SpO2:  [92 %-100 %] 95 % (05/01 0810)  Intake/Output from previous day: 04/30 0701 - 05/01 0700 In: 2598.8 [P.O.:1680; I.V.:643.8; IV Piggyback:275] Out: 2175 [Urine:2175] Intake/Output this shift:    Physical Exam: Work of breathing is normal.  Incisions covered with tagaderm.  Few BS-no flatus reported.    Lab Results:  No results found for this or any previous visit (from the past 48 hour(s)).  Radiology/Results: No results found.  Anti-infectives: Anti-infectives    Start     Dose/Rate Route Frequency Ordered Stop   03/11/15 0830  vancomycin (VANCOCIN) IVPB 1000 mg/200 mL premix  Status:  Discontinued     1,000 mg 200 mL/hr over 60 Minutes Intravenous To Surgery 03/10/15 1344 03/11/15 1830      Assessment/Plan: Problem List: Patient Active Problem List   Diagnosis Date Noted  . History of incisional hernia repair 03/11/2015  . Hyperlipidemia LDL goal <70 11/09/2014  . Abdominal pain, unspecified site 07/13/2014  . Diverticulosis 07/05/2014  . Diverticulitis of colon with perforation s/p Hartmann/colectomy/colostomy Feb 2015 07/05/2014  . DDD (degenerative disc disease), lumbosacral   . Anxiety   . Sjogren's disease   . Eczema   . Status post colostomy takedown 06/29/2014 04/24/2014  . Pulmonary hypertension 02/11/2014  . Chronic systolic CHF (congestive heart failure) 02/11/2014  . Chronic pain syndrome 02/11/2014  . Back pain 02/08/2014  . HTN (hypertension)  11/03/2013  . CAD (coronary artery disease) with CABG 02/21/2013  . Paroxysmal atrial fibrillation, maintaining SR on coumadin  01/27/2013    Will discontinue Foley and get into warm shower.  Kpad to abdomen.  Check lab in am.   3 Days Post-Op    LOS: 3 days   Matt B. Hassell Done, MD, [facility] [phone] beeper [phone]  03/14/2015 11:14 AM

## 2015-03-15 ENCOUNTER — Encounter (HOSPITAL_COMMUNITY): Payer: Self-pay | Admitting: Physician Assistant

## 2015-03-15 DIAGNOSIS — I480 Paroxysmal atrial fibrillation: Secondary | ICD-10-CM

## 2015-03-15 DIAGNOSIS — I9589 Other hypotension: Secondary | ICD-10-CM

## 2015-03-15 DIAGNOSIS — I25810 Atherosclerosis of coronary artery bypass graft(s) without angina pectoris: Secondary | ICD-10-CM

## 2015-03-15 LAB — BASIC METABOLIC PANEL
Anion gap: 5 (ref 5–15)
BUN: <5 mg/dL — ABNORMAL LOW (ref 6–20)
CO2: 26 mmol/L (ref 22–32)
Calcium: 7.1 mg/dL — ABNORMAL LOW (ref 8.9–10.3)
Chloride: 104 mmol/L (ref 101–111)
Creatinine, Ser: 0.38 mg/dL — ABNORMAL LOW (ref 0.44–1.00)
GFR calc Af Amer: >60 mL/min (ref >60)
GFR calc non Af Amer: >60 mL/min (ref >60)
Glucose, Bld: 125 mg/dL — ABNORMAL HIGH (ref 70–99)
Potassium: 3.9 mmol/L (ref 3.5–5.1)
Sodium: 135 mmol/L (ref 135–145)

## 2015-03-15 LAB — CBC WITH DIFFERENTIAL/PLATELET
Basophils Absolute: 0 10*3/uL (ref 0.0–0.1)
Basophils Relative: 0 % (ref 0–1)
Eosinophils Absolute: 0.1 10*3/uL (ref 0.0–0.7)
Eosinophils Relative: 2 % (ref 0–5)
HCT: 29.8 % — ABNORMAL LOW (ref 36.0–46.0)
Hemoglobin: 10.1 g/dL — ABNORMAL LOW (ref 12.0–15.0)
Lymphocytes Relative: 33 % (ref 12–46)
Lymphs Abs: 1.7 10*3/uL (ref 0.7–4.0)
MCH: 30.9 pg (ref 26.0–34.0)
MCHC: 33.9 g/dL (ref 30.0–36.0)
MCV: 91.1 fL (ref 78.0–100.0)
Monocytes Absolute: 0.5 10*3/uL (ref 0.1–1.0)
Monocytes Relative: 10 % (ref 3–12)
Neutro Abs: 2.8 10*3/uL (ref 1.7–7.7)
Neutrophils Relative %: 55 % (ref 43–77)
Platelets: 182 10*3/uL (ref 150–400)
RBC: 3.27 MIL/uL — ABNORMAL LOW (ref 3.87–5.11)
RDW: 14.1 % (ref 11.5–15.5)
WBC: 5.2 10*3/uL (ref 4.0–10.5)

## 2015-03-15 MED ORDER — WARFARIN - PHARMACIST DOSING INPATIENT: Freq: Every day | Status: DC

## 2015-03-15 MED ORDER — WARFARIN SODIUM 4 MG PO TABS
4.0000 mg | ORAL_TABLET | Freq: Once | ORAL | Status: AC
  Administered 2015-03-15: 4 mg via ORAL
  Filled 2015-03-15: qty 1

## 2015-03-15 MED ORDER — DOCUSATE SODIUM 100 MG PO CAPS
100.0000 mg | ORAL_CAPSULE | Freq: Two times a day (BID) | ORAL | Status: DC
  Administered 2015-03-15 – 2015-03-18 (×7): 100 mg via ORAL
  Filled 2015-03-15 (×8): qty 1

## 2015-03-15 MED ORDER — OXYCODONE HCL 5 MG PO TABS
5.0000 mg | ORAL_TABLET | ORAL | Status: DC | PRN
  Administered 2015-03-15: 10 mg via ORAL
  Administered 2015-03-15 – 2015-03-16 (×2): 5 mg via ORAL
  Administered 2015-03-16 – 2015-03-18 (×14): 10 mg via ORAL
  Filled 2015-03-15 (×9): qty 2
  Filled 2015-03-15: qty 1
  Filled 2015-03-15 (×7): qty 2

## 2015-03-15 MED ORDER — POLYETHYLENE GLYCOL 3350 17 G PO PACK
17.0000 g | PACK | Freq: Every day | ORAL | Status: DC
Start: 2015-03-15 — End: 2015-03-18
  Administered 2015-03-15 – 2015-03-18 (×4): 17 g via ORAL
  Filled 2015-03-15 (×5): qty 1

## 2015-03-15 MED ORDER — METOPROLOL SUCCINATE ER 25 MG PO TB24
12.5000 mg | ORAL_TABLET | Freq: Two times a day (BID) | ORAL | Status: DC
  Administered 2015-03-15 – 2015-03-18 (×7): 12.5 mg via ORAL
  Filled 2015-03-15 (×7): qty 1
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
## 2015-03-15 NOTE — Progress Notes (Signed)
Pt's BP is 104/38 and has BP meds due.  MD paged. Graceann Congress
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
## 2015-03-15 NOTE — Progress Notes (Addendum)
4 Days Post-Op  Subjective: Sore but no n/v. Lots of flatus starting last night. Ambulating ok Objective: Vital signs in last 24 hours: Temp:  [97.4 F (36.3 C)-98.6 F (37 C)] 97.4 F (36.3 C) (05/02 0929) Pulse Rate:  [59-74] 59 (05/02 0929) Resp:  [9-27] 18 (05/02 0929) BP: (104-145)/(38-77) 104/38 mmHg (05/02 0950) SpO2:  [96 %-100 %] 100 % (05/02 0929) Last BM Date: 03/10/15  Intake/Output from previous day: 05/01 0701 - 05/02 0700 In: 3291.3 [P.O.:360; I.V.:2711.3; IV Piggyback:220] Out: 8850 [Urine:3930] Intake/Output this shift:    Alert, sitting in chair Reg respiration Reg rhythm Soft, nd, incisions c/d/i, approp mild TTP No edema  Lab Results:   Recent Labs  03/12/15 1053 03/15/15 0516  WBC 9.9 5.2  HGB 12.1 10.1*  HCT 36.6 29.8*  PLT 178 182   BMET  Recent Labs  03/12/15 1053 03/15/15 0516  NA 135 135  K 4.3 3.9  CL 104 104  CO2 24 26  GLUCOSE 136* 125*  BUN 7 <5*  CREATININE 0.74 0.38*  CALCIUM 7.4* 7.1*   PT/INR No results for input(s): LABPROT, INR in the last 72 hours. ABG No results for input(s): PHART, HCO3 in the last 72 hours.  Invalid input(s): PCO2, PO2  Studies/Results: No results found.  Anti-infectives: Anti-infectives    Start     Dose/Rate Route Frequency Ordered Stop   03/11/15 0830  vancomycin (VANCOCIN) IVPB 1000 mg/200 mL premix  Status:  Discontinued     1,000 mg 200 mL/hr over 60 Minutes Intravenous To Surgery 03/10/15 1344 03/11/15 1830      Assessment/Plan: s/p Procedure(s): LAPAROSCOPIC ASSISTED VENTRAL INCISIONAL  HERNIA REPAIR POSSIBLE OPEN (N/A) INSERTION OF MESH (N/A) OPEN VENTRAL INCISIONAL HERNIA REPAIR ADULT (N/A)   Adv diet to regular Dec IVF Add PO pain meds. Will dc PCA Tuesday Stool softners Restart coumadin HTN - bp has been running low normal. Favor continuing her b blocker and holding her lisinopril for now. Will have cards come by just to evaluate her BP meds. VTE prophylaxis -  scds, subcu heparin  [name]. [name], MD, FACS General, Bariatric, & Minimally Invasive Surgery [facility], [place]   LOS: 4 days    [name] 03/15/2015

## 2015-03-16 ENCOUNTER — Inpatient Hospital Stay (HOSPITAL_COMMUNITY): Payer: Medicare Other

## 2015-03-16 DIAGNOSIS — I2510 Atherosclerotic heart disease of native coronary artery without angina pectoris: Secondary | ICD-10-CM

## 2015-03-16 DIAGNOSIS — I959 Hypotension, unspecified: Secondary | ICD-10-CM

## 2015-03-16 DIAGNOSIS — I480 Paroxysmal atrial fibrillation: Secondary | ICD-10-CM

## 2015-03-16 LAB — PROTIME-INR
INR: 1.25 (ref 0.00–1.49)
Prothrombin Time: 15.8 seconds — ABNORMAL HIGH (ref 11.6–15.2)

## 2015-03-16 LAB — BASIC METABOLIC PANEL
Anion gap: 8 (ref 5–15)
BUN: <5 mg/dL — ABNORMAL LOW (ref 6–20)
CO2: 24 mmol/L (ref 22–32)
Calcium: 7.2 mg/dL — ABNORMAL LOW (ref 8.9–10.3)
Chloride: 101 mmol/L (ref 101–111)
Creatinine, Ser: 0.44 mg/dL (ref 0.44–1.00)
GFR calc Af Amer: >60 mL/min (ref >60)
GFR calc non Af Amer: >60 mL/min (ref >60)
Glucose, Bld: 115 mg/dL — ABNORMAL HIGH (ref 70–99)
Potassium: 4.2 mmol/L (ref 3.5–5.1)
Sodium: 133 mmol/L — ABNORMAL LOW (ref 135–145)

## 2015-03-16 LAB — URINALYSIS, ROUTINE W REFLEX MICROSCOPIC
Bilirubin Urine: NEGATIVE
Glucose, UA: NEGATIVE mg/dL
Ketones, ur: 15 mg/dL — AB
Nitrite: POSITIVE — AB
Protein, ur: NEGATIVE mg/dL
Specific Gravity, Urine: 1.008 (ref 1.005–1.030)
Urobilinogen, UA: 1 mg/dL (ref 0.0–1.0)
pH: 5.5 (ref 5.0–8.0)

## 2015-03-16 LAB — URINE MICROSCOPIC-ADD ON

## 2015-03-16 LAB — CBC
HCT: 29.3 % — ABNORMAL LOW (ref 36.0–46.0)
Hemoglobin: 9.8 g/dL — ABNORMAL LOW (ref 12.0–15.0)
MCH: 30.6 pg (ref 26.0–34.0)
MCHC: 33.4 g/dL (ref 30.0–36.0)
MCV: 91.6 fL (ref 78.0–100.0)
Platelets: 187 10*3/uL (ref 150–400)
RBC: 3.2 MIL/uL — ABNORMAL LOW (ref 3.87–5.11)
RDW: 14.1 % (ref 11.5–15.5)
WBC: 5.9 10*3/uL (ref 4.0–10.5)

## 2015-03-16 LAB — MAGNESIUM: Magnesium: 2 mg/dL (ref 1.7–2.4)

## 2015-03-16 MED ORDER — ENSURE PUDDING PO PUDG
1.0000 | ORAL | Status: DC
  Administered 2015-03-16: 1 via ORAL
  Filled 2015-03-16 (×4): qty 1

## 2015-03-16 MED ORDER — BISACODYL 10 MG RE SUPP
10.0000 mg | Freq: Once | RECTAL | Status: AC
  Administered 2015-03-16: 10 mg via RECTAL
  Filled 2015-03-16: qty 1

## 2015-03-16 MED ORDER — LISINOPRIL 10 MG PO TABS
10.0000 mg | ORAL_TABLET | Freq: Every day | ORAL | Status: DC
  Filled 2015-03-16 (×3): qty 1

## 2015-03-16 MED ORDER — WARFARIN SODIUM 4 MG PO TABS
4.0000 mg | ORAL_TABLET | Freq: Once | ORAL | Status: AC
  Administered 2015-03-16: 4 mg via ORAL
  Filled 2015-03-16: qty 1

## 2015-03-16 MED ORDER — HYDROMORPHONE HCL 1 MG/ML IJ SOLN: 0.5000 mg | INTRAMUSCULAR | Status: DC | PRN

## 2015-03-16 NOTE — Progress Notes (Signed)
5 Days Post-Op  Subjective: Yesterday was rought. No n/v. Was a little SOB on exertion. +flatus. No BM. Nurse and pt report foul smelling urine  Objective: Vital signs in last 24 hours: Temp:  [97.4 F (36.3 C)-98.6 F (37 C)] 98.2 F (36.8 C) (05/03 0547) Pulse Rate:  [59-75] 64 (05/03 0547) Resp:  [15-23] 23 (05/03 0547) BP: (104-132)/(38-60) 117/51 mmHg (05/03 0547) SpO2:  [96 %-100 %] 99 % (05/03 0547) Last BM Date: 03/10/15  Intake/Output from previous day: 05/02 0701 - 05/03 0700 In: 8242 [P.O.:120; I.V.:1295; IV Piggyback:220] Out: 1900 [Urine:1900] Intake/Output this shift:    Asleep; easily awakens cta b/l Reg Soft, mild distension, min TTP, hypoBS, incision cdi No edema  Lab Results:   Recent Labs  03/15/15 0516 03/16/15 0515  WBC 5.2 5.9  HGB 10.1* 9.8*  HCT 29.8* 29.3*  PLT 182 187   BMET  Recent Labs  03/15/15 0516 03/16/15 0515  NA 135 133*  K 3.9 4.2  CL 104 101  CO2 26 24  GLUCOSE 125* 115*  BUN <5* <5*  CREATININE 0.38* 0.44  CALCIUM 7.1* 7.2*   PT/INR  Recent Labs  03/16/15 0515  LABPROT 15.8*  INR 1.25   ABG No results for input(s): PHART, HCO3 in the last 72 hours.  Invalid input(s): PCO2, PO2  Studies/Results: No results found.  Anti-infectives: Anti-infectives    Start     Dose/Rate Route Frequency Ordered Stop   03/11/15 0830  vancomycin (VANCOCIN) IVPB 1000 mg/200 mL premix  Status:  Discontinued     1,000 mg 200 mL/hr over 60 Minutes Intravenous To Surgery 03/10/15 1344 03/11/15 1830      Assessment/Plan: s/p Procedure(s): LAPAROSCOPIC ASSISTED VENTRAL INCISIONAL  HERNIA REPAIR POSSIBLE OPEN (N/A) INSERTION OF MESH (N/A) OPEN VENTRAL INCISIONAL HERNIA REPAIR ADULT (N/A)   Not ready for dc Check cxr to look at volume status Dec MIVF Dc pca; IV for breakthru Check UA Cont coumadin per pharmacy Await return of bowel function Pt has decline PT consult  [name]. [name], MD, FACS General, Bariatric, &  Minimally Invasive Surgery [facility], [place]   LOS: 5 days    Gayland Curry 03/16/2015

## 2015-03-16 NOTE — Care Management Note (Signed)
Case Management Note  Patient Details  Name: [name] MRN: [id number] Date of Birth: May 18, 1946  Subjective/Objective:                    Action/Plan:  Await return of bowel function Pt has decline PT consult   Expected Discharge Date:  03/19/15               Expected Discharge Plan:  Home/Self Care  In-House Referral:     Discharge planning Services     Post Acute Care Choice:    Choice offered to:     DME Arranged:    DME Agency:     HH Arranged:    [place] Agency:     Status of Service:     Medicare Important Message Given:    Date Medicare IM Given:  03/16/15 Medicare IM give by:  Magdalen Spatz RN  Date Additional Medicare IM Given:    Additional Medicare Important Message give by:     If discussed at [place] of Stay Meetings, dates discussed:    Additional Comments:  Marilu Favre, RN 03/16/2015, 8:12 AM

## 2015-03-16 NOTE — Progress Notes (Signed)
[facility] for coumadin Indication: atrial fibrillation  Allergies  Allergen Reactions  . Crestor [Rosuvastatin]     Leg pain  . Diltiazem     Weakness on oral Dilt  . Lipitor [Atorvastatin]     Leg pain  . Penicillins Other (See Comments)    Blurred vision  . Phenergan [Promethazine Hcl] Other (See Comments)    Nervous Leg / Restless Leg Syndrome  . Promethazine Other (See Comments)    Uncontrolled leg shaking  . Reclast [Zoledronic Acid] Other (See Comments)    Flu symptoms - made pt very sick, and had inflammation  In her eye  . Latex Rash  . Sulfa Antibiotics Photosensitivity and Rash    Burning Rash    Patient Measurements: Height: 5' 2.5" (158.8 cm) Weight: 139 lb (63.05 kg) IBW/kg (Calculated) : 51.25   Vital Signs: Temp: 98.2 F (36.8 C) (05/03 0547) Temp Source: Oral (05/03 0547) BP: 135/48 mmHg (05/03 0930) Pulse Rate: 79 (05/03 0930)  Labs:  Recent Labs  03/15/15 0516 03/16/15 0515  HGB 10.1* 9.8*  HCT 29.8* 29.3*  PLT 182 187  LABPROT  --  15.8*  INR  --  1.25  CREATININE 0.38* 0.44    Estimated Creatinine Clearance: 57.8 mL/min (by C-G formula based on Cr of 0.44).   Assessment: 69 yo F POD #5 s/p Lap ventral hernia repair, insertion of mesh,  Pharmacy resumed home coumadin for afib on 5/2.  Last seen in coumadin clinic 02/12/15: INR 2 on 2.5 mg daily except 3.75 mg (1.5 tablet of 2.5 mg tablet) on Wednesdays. Admission INR 1.82 on 4/24.  Post op H/H 10.1/28.8>>9.8/29.3, pltc stable, no bleeding. INR 1.25 today.    Goal of Therapy:  INR 2-3   Plan:  -continues on sq heparin until INR tx - repeat coumadin 4 mg po x 1 dose -daily INR  Eudelia Bunch, Pharm.D. [phone] 03/16/2015 2:31 PM

## 2015-03-16 NOTE — Progress Notes (Signed)
Patient ID: [name], female   DOB: August 04, 1946, 69 y.o.   MRN: [id number]    Subjective:  Denies SSCP, palpitations or Dyspnea 3rd major bowel surgery in 14 months   INR 1.25     Objective:  Filed Vitals:   03/15/15 2159 03/16/15 0102 03/16/15 0547 03/16/15 0739  BP: 132/46 132/60 117/51   Pulse: 73 74 64   Temp: 98.6 F (37 C) 98.2 F (36.8 C) 98.2 F (36.8 C)   TempSrc: Oral Oral Oral   Resp: '20 23 23 20  '$ Height:      Weight:      SpO2: 100% 100% 99%     Intake/Output from previous day:  Intake/Output Summary (Last 24 hours) at 03/16/15 0759 Last data filed at 03/16/15 0545  Gross per 24 hour  Intake   1635 ml  Output   1900 ml  Net   -265 ml    Physical Exam: Affect appropriate Elderly white female  HEENT: normal Neck supple with no adenopathy JVP normal no bruits no thyromegaly Lungs clear with no wheezing and good diaphragmatic motion Heart:  S1/S2 SEM , no rub, gallop or click PMI normal Abdomen: binder with recent surgery BS positive  no bruit.  No HSM or HJR Distal pulses intact with no bruits No edema Neuro non-focal Skin warm and dry No muscular weakness   Lab Results: Basic Metabolic Panel:  Recent Labs  03/15/15 0516 03/16/15 0515  NA 135 133*  K 3.9 4.2  CL 104 101  CO2 26 24  GLUCOSE 125* 115*  BUN <5* <5*  CREATININE 0.38* 0.44  CALCIUM 7.1* 7.2*  MG  --  2.0   CBC:  Recent Labs  03/15/15 0516 03/16/15 0515  WBC 5.2 5.9  NEUTROABS 2.8  --   HGB 10.1* 9.8*  HCT 29.8* 29.3*  MCV 91.1 91.6  PLT 182 187    Imaging: No results found.  Cardiac Studies:  ECG:   SR rate 75 nonspecific ST/T wave changes    Telemetry:  NSR rates 75-80    Echo:  01/01/14  Study Conclusions  - Left ventricle: The cavity size was normal. Wall thickness was normal. The estimated ejection fraction was 60%. Wall motion was normal; there were no regional wall motion abnormalities. - Aortic valve: Sclerosis without stenosis. No  significant regurgitation. - Right ventricle: The cavity size was mildly dilated. Systolic function was mildly reduced. - Right atrium: The atrium was mildly dilated. - Pulmonary arteries: PA peak pressure: 23m Hg (S).  Medications:   . bethanechol  25 mg Oral QID  . docusate sodium  100 mg Oral BID  . ezetimibe  10 mg Oral QPM  . feeding supplement (ENSURE)  1 Container Oral Q24H  . heparin subcutaneous  5,000 Units Subcutaneous 3 times per day  . isosorbide mononitrate  30 mg Oral Daily  . lisinopril  10 mg Oral BID  . methocarbamol (ROBAXIN)  IV  500 mg Intravenous 3 times per day  . metoprolol succinate  12.5 mg Oral BID  . polyethylene glycol  17 g Oral Daily  . sodium chloride  10-40 mL Intracatheter Q12H  . tamsulosin  0.4 mg Oral Daily  . Warfarin - Pharmacist Dosing Inpatient   Does not apply q1800     . dextrose 5 % and 0.45 % NaCl with KCl 20 mEq/L 10 mL/hr at 03/16/15 00737   Assessment/Plan:  Hypotension:  Improved related to post op state and pain meds.  I decreased lisinipril to daily dosing CAD:  Stable non ischemic myovue 2015  ECG post op ok continue beta blocker and nitrates Murmur:  AV sclerosis PAF:  NSR  Getting coumadin per pharmacy GI:  Routine post op care per surgery   Jenkins Rouge 03/16/2015, 7:59 AM

## 2015-03-16 NOTE — Progress Notes (Signed)
0600 Patient urine is very cloudy with a foul odor each time she voided this shift. Will continue to monitor.

## 2015-03-17 LAB — PROTIME-INR
INR: 1.25 (ref 0.00–1.49)
Prothrombin Time: 15.8 seconds — ABNORMAL HIGH (ref 11.6–15.2)

## 2015-03-17 MED ORDER — WARFARIN SODIUM 5 MG PO TABS
5.0000 mg | ORAL_TABLET | Freq: Once | ORAL | Status: AC
  Administered 2015-03-17: 5 mg via ORAL
  Filled 2015-03-17: qty 1

## 2015-03-17 MED ORDER — CIPROFLOXACIN HCL 500 MG PO TABS
500.0000 mg | ORAL_TABLET | Freq: Two times a day (BID) | ORAL | Status: DC
Start: 2015-03-17 — End: 2015-03-18
  Administered 2015-03-17 – 2015-03-18 (×3): 500 mg via ORAL
  Filled 2015-03-17 (×3): qty 1

## 2015-03-17 MED ORDER — FUROSEMIDE 10 MG/ML IJ SOLN
20.0000 mg | Freq: Once | INTRAMUSCULAR | Status: AC
  Administered 2015-03-17: 20 mg via INTRAVENOUS
  Filled 2015-03-17: qty 2

## 2015-03-17 NOTE — Progress Notes (Signed)
[employer] for coumadin Indication: atrial fibrillation  Allergies  Allergen Reactions  . Crestor [Rosuvastatin]     Leg pain  . Diltiazem     Weakness on oral Dilt  . Lipitor [Atorvastatin]     Leg pain  . Penicillins Other (See Comments)    Blurred vision  . Phenergan [Promethazine Hcl] Other (See Comments)    Nervous Leg / Restless Leg Syndrome  . Promethazine Other (See Comments)    Uncontrolled leg shaking  . Reclast [Zoledronic Acid] Other (See Comments)    Flu symptoms - made pt very sick, and had inflammation  In her eye  . Latex Rash  . Sulfa Antibiotics Photosensitivity and Rash    Burning Rash    Patient Measurements: Height: 5' 2.5" (158.8 cm) Weight: 139 lb (63.05 kg) IBW/kg (Calculated) : 51.25   Vital Signs: Temp: 97.6 F (36.4 C) (05/04 0548) Temp Source: Oral (05/04 0548) BP: 114/55 mmHg (05/04 0548) Pulse Rate: 57 (05/04 0548)  Labs:  Recent Labs  03/15/15 0516 03/16/15 0515 03/17/15 0514  HGB 10.1* 9.8*  --   HCT 29.8* 29.3*  --   PLT 182 187  --   LABPROT  --  15.8* 15.8*  INR  --  1.25 1.25  CREATININE 0.38* 0.44  --     Estimated Creatinine Clearance: 57.8 mL/min (by C-G formula based on Cr of 0.44).   Assessment: 69 yo F POD #6 s/p Lap ventral hernia repair, insertion of mesh,  Pharmacy resumed home coumadin for afib on 5/2.  Last seen in coumadin clinic 02/12/15: INR 2 on 2.5 mg daily except 3.75 mg (1.5 tablet of 2.5 mg tablet) on Wednesdays. Admission INR 1.82 on 4/24.  Post op H/H 10.1/28.8>>9.8/29.3, pltc stable, no bleeding. INR remains 1.25 today after 2 doses of coumadin 4 mg.   New cipro added which may increase INR.   Goal of Therapy:  INR 2-3   Plan:  -continues on sq heparin until INR tx -coumadin 5 mg po x 1 dose -daily INR  Eudelia Bunch, Pharm.D. [phone] 03/17/2015 10:32 AM

## 2015-03-17 NOTE — Progress Notes (Deleted)
Pt refuses to come to OR unless MD comes and witnesses BM.

## 2015-03-17 NOTE — Progress Notes (Addendum)
Patient Name: [name] Date of Encounter: 03/17/2015  Active Problems:   Paroxysmal atrial fibrillation, maintaining SR on coumadin    CAD (coronary artery disease) with CABG   History of incisional hernia repair   Hypotension   PAF (paroxysmal atrial fibrillation)   Primary Cardiologist: Dr Claiborne Billings  Patient Profile: 69 y.o. year old female with a history of HTN, PAF on coumadin, PAD, nl EF, AOV sclerosis, LA 39 mm by echo 12/2013, and colostomy. She was admitted 04/28 for hernia surgery, cards saw for low BP, hx PAF.  SUBJECTIVE: Feeling about the same today.  States she was up walking last night with less SOB, but has not had an opportunity to walk today.  Still in a little pain from surgery. Denies CP or palpitations.  OBJECTIVE Filed Vitals:   03/16/15 1820 03/16/15 2136 03/17/15 0220 03/17/15 0548  BP: 110/59 106/49 97/48 114/55  Pulse: 71 66 64 57  Temp: 98.4 F (36.9 C) 98.6 F (37 C) 98.2 F (36.8 C) 97.6 F (36.4 C)  TempSrc: Oral Oral  Oral  Resp:  '17 16 20  '$ Height:      Weight:      SpO2: 99% 99% 98% 99%    Intake/Output Summary (Last 24 hours) at 03/17/15 1028 Last data filed at 03/17/15 0600  Gross per 24 hour  Intake 1255.5 ml  Output   1150 ml  Net  105.5 ml   Filed Weights   03/11/15 0729 03/13/15 0759  Weight: 139 lb (63.05 kg) 139 lb (63.05 kg)    PHYSICAL EXAM General: Well developed, well nourished, female in no acute distress. Head: Normocephalic, atraumatic.  Neck: Supple without bruits, JVD 10 cm Lungs:  Resp regular and unlabored. Rales bases. Limited exam to lower fields reference abdominal binding. Heart: RRR, S1, S2, no S3, S4, or murmur appreciated.  No rub Abdomen: Binding in place s/p incisional hernia repair. Extremities: No clubbing, cyanosis Trace edema in LEs bilaterally. No pitting. Neuro: Alert and oriented X 3. Moves all extremities spontaneously. Psych: Normal affect.  LABS: CBC: Recent Labs  03/15/15 0516  03/16/15 0515  WBC 5.2 5.9  NEUTROABS 2.8  --   HGB 10.1* 9.8*  HCT 29.8* 29.3*  MCV 91.1 91.6  PLT 182 187   INR: Recent Labs  03/17/15 0514  INR 4.00   Basic Metabolic Panel: Recent Labs  03/15/15 0516 03/16/15 0515  NA 135 133*  K 3.9 4.2  CL 104 101  CO2 26 24  GLUCOSE 125* 115*  BUN <5* <5*  CREATININE 0.38* 0.44  CALCIUM 7.1* 7.2*  MG  --  2.0    TELE: NSR in 60s-70s  Radiology/Studies: Dg Chest 2 View  03/16/2015   CLINICAL DATA:  Patient with shortness of breath on exertion.  EXAM: CHEST  2 VIEW  COMPARISON:  Chest radiograph 12/02/2014  FINDINGS: Right upper extremity PICC line tip projects over the right atrium. Monitoring leads overlie the patient. Stable cardiac and mediastinal contours. Tortuosity and calcification of the thoracic aorta. Status post CABG procedure. Elevation of the right hemidiaphragm. Minimal heterogeneous opacities right greater than left lung base likely represent atelectasis. Small amount of subcutaneous emphysema within the right lateral chest wall.  IMPRESSION: Small amount of subcutaneous gas within the right lateral chest wall.  Right upper extremity PICC line tip projects over the right atrium.  Elevation right hemidiaphragm with minimal right basilar atelectasis.   Electronically Signed   By: Polly Cobia.D.  On: 03/16/2015 16:22     Current Medications:  . bethanechol  25 mg Oral QID  . ciprofloxacin  500 mg Oral BID  . docusate sodium  100 mg Oral BID  . ezetimibe  10 mg Oral QPM  . feeding supplement (ENSURE)  1 Container Oral Q24H  . furosemide  20 mg Intravenous Once  . heparin subcutaneous  5,000 Units Subcutaneous 3 times per day  . isosorbide mononitrate  30 mg Oral Daily  . lisinopril  10 mg Oral Daily  . methocarbamol (ROBAXIN)  IV  500 mg Intravenous 3 times per day  . metoprolol succinate  12.5 mg Oral BID  . polyethylene glycol  17 g Oral Daily  . sodium chloride  10-40 mL Intracatheter Q12H  . tamsulosin  0.4  mg Oral Daily  . Warfarin - Pharmacist Dosing Inpatient   Does not apply q1800   . dextrose 5 % and 0.45 % NaCl with KCl 20 mEq/L 10 mL/hr at 03/16/15 8127    ASSESSMENT AND PLAN:  Active Problems:   Paroxysmal atrial fibrillation, maintaining SR on coumadin    CAD (coronary artery disease) with CABG   History of incisional hernia repair   Hypotension   PAF (paroxysmal atrial fibrillation)  Hypotension  - lisinopril decreased yesterday. Diastolic BP still low at 517/00  CAD - Continue BBs and nitrates  PAF: - NSR currently.  On Coumadin  GI - Routine post op care with surgery team  Volume overload - I/O + >2.5 L since admit, IV Lasix 20 mg given by primary MD. - agree w/ this, CXR abnl, will ask primary team to review (they say small amt subcu emphysema can be normal after lap surgery). - follow I/O, daily weights, BMET - not sure pt ready for d/c today, may be better tomorrow but will leave to primary team.  Signed, Rosaria Ferries , PA-C 10:28 AM 03/17/2015  Patient doing well post op with BM  On cipro should check INR next week Maint NSR  Lopressor 12.5 bid  Lung exam wet lasix appropriate  CXR with  SQ air?    Jenkins Rouge

## 2015-03-17 NOTE — Progress Notes (Signed)
6 Days Post-Op  Subjective: No n/v. Large BM. Feels like knees, ankles are swollen. Food salty  Objective: Vital signs in last 24 hours: Temp:  [97.6 F (36.4 C)-98.6 F (37 C)] 97.6 F (36.4 C) (05/04 0548) Pulse Rate:  [57-79] 57 (05/04 0548) Resp:  [16-20] 20 (05/04 0548) BP: (97-135)/(48-59) 114/55 mmHg (05/04 0548) SpO2:  [98 %-100 %] 99 % (05/04 0548) Last BM Date: 03/16/15  Intake/Output from previous day: 05/03 0701 - 05/04 0700 In: 1495.5 [P.O.:960; I.V.:310.5; IV Piggyback:225] Out: 9935 [Urine:1450] Intake/Output this shift:    Alert, nontoxic cta b/l Reg Soft, expected mild TTP throughout, ND, incision c/d/i No pitting edema, perhaps trace edema   Lab Results:   Recent Labs  03/15/15 0516 03/16/15 0515  WBC 5.2 5.9  HGB 10.1* 9.8*  HCT 29.8* 29.3*  PLT 182 187   BMET  Recent Labs  03/15/15 0516 03/16/15 0515  NA 135 133*  K 3.9 4.2  CL 104 101  CO2 26 24  GLUCOSE 125* 115*  BUN <5* <5*  CREATININE 0.38* 0.44  CALCIUM 7.1* 7.2*   PT/INR  Recent Labs  03/16/15 0515 03/17/15 0514  LABPROT 15.8* 15.8*  INR 1.25 1.25   ABG No results for input(s): PHART, HCO3 in the last 72 hours.  Invalid input(s): PCO2, PO2  Studies/Results: Dg Chest 2 View  03/16/2015   CLINICAL DATA:  Patient with shortness of breath on exertion.  EXAM: CHEST  2 VIEW  COMPARISON:  Chest radiograph 12/02/2014  FINDINGS: Right upper extremity PICC line tip projects over the right atrium. Monitoring leads overlie the patient. Stable cardiac and mediastinal contours. Tortuosity and calcification of the thoracic aorta. Status post CABG procedure. Elevation of the right hemidiaphragm. Minimal heterogeneous opacities right greater than left lung base likely represent atelectasis. Small amount of subcutaneous emphysema within the right lateral chest wall.  IMPRESSION: Small amount of subcutaneous gas within the right lateral chest wall.  Right upper extremity PICC line tip  projects over the right atrium.  Elevation right hemidiaphragm with minimal right basilar atelectasis.   Electronically Signed   By: Lovey Newcomer M.D.   On: 03/16/2015 16:22    Anti-infectives: Anti-infectives    Start     Dose/Rate Route Frequency Ordered Stop   03/17/15 0830  ciprofloxacin (CIPRO) tablet 500 mg     500 mg Oral 2 times daily 03/17/15 0820     03/11/15 0830  vancomycin (VANCOCIN) IVPB 1000 mg/200 mL premix  Status:  Discontinued     1,000 mg 200 mL/hr over 60 Minutes Intravenous To Surgery 03/10/15 1344 03/11/15 1830      Assessment/Plan: s/p Procedure(s): LAPAROSCOPIC ASSISTED VENTRAL INCISIONAL  HERNIA REPAIR POSSIBLE OPEN (N/A) INSERTION OF MESH (N/A) OPEN VENTRAL INCISIONAL HERNIA REPAIR ADULT (N/A)  UTI - ua c/w UTI. cx pending. Will start cipro LE trace edema - no significant fluid overload on cxr. Trace edema in le. Will give 1 dose of lasix PAF - cont home meds. Cont coumadin VTE prophylaxis - subcu heparin until INR therapeutic, SCDs, ambulate  Will check on after lunch. Possible dc later today  [name]. [name], MD, FACS General, Bariatric, & Minimally Invasive Surgery [facility], [place]   LOS: 6 days    [name] 03/17/2015

## 2015-03-18 DIAGNOSIS — I9581 Postprocedural hypotension: Secondary | ICD-10-CM

## 2015-03-18 LAB — URINE CULTURE: Colony Count: >=100000

## 2015-03-18 LAB — BASIC METABOLIC PANEL
Anion gap: 8 (ref 5–15)
BUN: 5 mg/dL — ABNORMAL LOW (ref 6–20)
CO2: 28 mmol/L (ref 22–32)
Calcium: 7.8 mg/dL — ABNORMAL LOW (ref 8.9–10.3)
Chloride: 99 mmol/L — ABNORMAL LOW (ref 101–111)
Creatinine, Ser: 0.49 mg/dL (ref 0.44–1.00)
GFR calc Af Amer: >60 mL/min (ref >60)
GFR calc non Af Amer: >60 mL/min (ref >60)
Glucose, Bld: 106 mg/dL — ABNORMAL HIGH (ref 70–99)
Potassium: 3.6 mmol/L (ref 3.5–5.1)
Sodium: 135 mmol/L (ref 135–145)

## 2015-03-18 LAB — PROTIME-INR
INR: 1.2 (ref 0.00–1.49)
Prothrombin Time: 15.4 seconds — ABNORMAL HIGH (ref 11.6–15.2)

## 2015-03-18 LAB — MAGNESIUM: Magnesium: 2 mg/dL (ref 1.7–2.4)

## 2015-03-18 MED ORDER — OXYCODONE HCL 5 MG PO TABS: 5.0000 mg | ORAL_TABLET | ORAL | Status: DC | PRN

## 2015-03-18 MED ORDER — LISINOPRIL 20 MG PO TABS: 10.0000 mg | ORAL_TABLET | Freq: Every day | ORAL | Status: DC

## 2015-03-18 MED ORDER — WARFARIN SODIUM 5 MG PO TABS: 5.0000 mg | ORAL_TABLET | Freq: Once | ORAL | Status: DC

## 2015-03-18 MED ORDER — FUROSEMIDE 20 MG PO TABS
10.0000 mg | ORAL_TABLET | Freq: Once | ORAL | Status: AC
  Administered 2015-03-18: 10 mg via ORAL
  Filled 2015-03-18: qty 1

## 2015-03-18 MED ORDER — METOPROLOL SUCCINATE ER 25 MG PO TB24: 12.5000 mg | ORAL_TABLET | Freq: Two times a day (BID) | ORAL | Status: DC

## 2015-03-18 MED ORDER — CIPROFLOXACIN HCL 500 MG PO TABS: 500.0000 mg | ORAL_TABLET | Freq: Two times a day (BID) | ORAL | Status: DC

## 2015-03-18 NOTE — Progress Notes (Signed)
Patient ID: EMALYN SCHOU, female   DOB: [birth year], 69 y.o.   MRN: [id number]     Patient Name: [name] Date of Encounter: 03/18/2015  Active Problems:   Paroxysmal atrial fibrillation, maintaining SR on coumadin    CAD (coronary artery disease) with CABG   History of incisional hernia repair   Hypotension   PAF (paroxysmal atrial fibrillation)   Primary Cardiologist: Dr Claiborne Billings  Patient Profile: 69 y.o. year old female with a history of HTN, PAF on coumadin, PAD, nl EF, AOV sclerosis, LA 39 mm by echo 12/2013, and colostomy. She was admitted 04/28 for hernia surgery, cards saw for low BP, hx PAF.  SUBJECTIVE: Feels good thinks she's going home   OBJECTIVE Filed Vitals:   03/17/15 1737 03/17/15 2238 03/18/15 0620 03/18/15 0741  BP:  126/51 125/51   Pulse:  65 72   Temp: 98 F (36.7 C) 98.5 F (36.9 C) 98.5 F (36.9 C)   TempSrc:  Oral    Resp:  16 16   Height:      Weight:   136 lb 15.4 oz (62.125 kg)   SpO2:  98% 97% 95%    Intake/Output Summary (Last 24 hours) at 03/18/15 4268 Last data filed at 03/18/15 0603  Gross per 24 hour  Intake    990 ml  Output   2700 ml  Net  -1710 ml   Filed Weights   03/11/15 0729 03/13/15 0759 03/18/15 0620  Weight: 139 lb (63.05 kg) 139 lb (63.05 kg) 136 lb 15.4 oz (62.125 kg)    PHYSICAL EXAM General: Well developed, well nourished, female in no acute distress. Head: Normocephalic, atraumatic.  Neck: Supple without bruits, JVD 10 cm Lungs:  Resp regular and unlabored. No rales this am   Limited exam to lower fields reference abdominal binding. Heart: RRR, S1, S2, no S3, S4, or murmur appreciated.  No rub Abdomen: Binding in place s/p incisional hernia repair. Extremities: No clubbing, cyanosis Trace edema in LEs bilaterally. No pitting. Neuro: Alert and oriented X 3. Moves all extremities spontaneously. Psych: Normal affect. PIC line RUE   LABS: CBC:  Recent Labs  03/16/15 0515  WBC 5.9  HGB 9.8*  HCT 29.3*    MCV 91.6  PLT 187   INR:  Recent Labs  03/18/15 0430  INR 3.41   Basic Metabolic Panel:  Recent Labs  03/16/15 0515 03/18/15 0430  NA 133* 135  K 4.2 3.6  CL 101 99*  CO2 24 28  GLUCOSE 115* 106*  BUN <5* 5*  CREATININE 0.44 0.49  CALCIUM 7.2* 7.8*  MG 2.0 2.0    TELE: NSR in 60s-70s  Radiology/Studies: Dg Chest 2 View  03/16/2015   CLINICAL DATA:  Patient with shortness of breath on exertion.  EXAM: CHEST  2 VIEW  COMPARISON:  Chest radiograph 12/02/2014  FINDINGS: Right upper extremity PICC line tip projects over the right atrium. Monitoring leads overlie the patient. Stable cardiac and mediastinal contours. Tortuosity and calcification of the thoracic aorta. Status post CABG procedure. Elevation of the right hemidiaphragm. Minimal heterogeneous opacities right greater than left lung base likely represent atelectasis. Small amount of subcutaneous emphysema within the right lateral chest wall.  IMPRESSION: Small amount of subcutaneous gas within the right lateral chest wall.  Right upper extremity PICC line tip projects over the right atrium.  Elevation right hemidiaphragm with minimal right basilar atelectasis.   Electronically Signed   By: Lovey Newcomer M.D.   On: 03/16/2015  16:22     Current Medications:  . ciprofloxacin  500 mg Oral BID  . docusate sodium  100 mg Oral BID  . ezetimibe  10 mg Oral QPM  . feeding supplement (ENSURE)  1 Container Oral Q24H  . heparin subcutaneous  5,000 Units Subcutaneous 3 times per day  . isosorbide mononitrate  30 mg Oral Daily  . lisinopril  10 mg Oral Daily  . methocarbamol (ROBAXIN)  IV  500 mg Intravenous 3 times per day  . metoprolol succinate  12.5 mg Oral BID  . polyethylene glycol  17 g Oral Daily  . sodium chloride  10-40 mL Intracatheter Q12H  . tamsulosin  0.4 mg Oral Daily  . Warfarin - Pharmacist Dosing Inpatient   Does not apply q1800   . dextrose 5 % and 0.45 % NaCl with KCl 20 mEq/L 10 mL/hr at 03/16/15 2244     ASSESSMENT AND PLAN:  Active Problems:   Paroxysmal atrial fibrillation, maintaining SR on coumadin    CAD (coronary artery disease) with CABG   History of incisional hernia repair   Hypotension   PAF (paroxysmal atrial fibrillation)  Hypotension  - lisinopril decreased  Stable would send home on 10 mg lisinopril rather than '20mg'$   CAD - Continue BBs and nitrates no angina  PAF: - NSR currently.  On Coumadin INR subRx  Will arrange PT/INR f/u at [facility] office next week  GI - Routine post op care with surgery team  Volume overload  Resolved over 1.7 L diuresis  No dyspnea CXR no CHF  Elevated hemidiaphragm and ? subq air not clear to me etiology  Plan per Surgery  Jenkins Rouge

## 2015-03-18 NOTE — Progress Notes (Signed)
Daughter and pt verbally understand DC instructions, no questions asked

## 2015-03-18 NOTE — Discharge Summary (Signed)
Physician Discharge Summary  [name] GNF:[id number] DOB: 1946-07-16 DOA: 03/11/2015  PCP: Bridget Pink, MD  Admit date: 03/11/2015 Discharge date: 03/18/2015  Recommendations for Outpatient Follow-up:   Follow-up Information    Follow up with Coumadin Appt On 03/19/2015.   Why:  2:00 PM   Contact information:   [address] [place] [address] [phone]      Follow up with Bridget Curry, MD On 04/07/2015.   Specialty:  General Surgery   Why:  8:45 AM (arrive 8:30 ) , For wound re-check   Contact information:   [address] [place] [address] [phone]       Follow up with [name] 4 days.   Why:  NURSE ONLY APPT For suture removal - my assistant will call you with date and time; [phone]   Contact information:   [address] [place] [address]       Follow up with Bridget Pink, MD. Schedule an appointment as soon as possible for a visit [name] 2 weeks.   Specialty:  Family Medicine   Contact information:   [address] [place] [address] [phone]      Discharge Diagnoses:  Active Problems:   Paroxysmal atrial fibrillation, maintaining SR on coumadin    CAD (coronary artery disease) with CABG   Chronic systolic CHF (congestive heart failure)   s/p incisional hernia repair   Hypotension - resolved   PAF (paroxysmal atrial fibrillation)  E coli UTI Urinary retention-resolved  Surgical Procedure: Open primary repair of ventral incisional hernia with laparoscopic mesh underlay  Discharge Condition: Good Disposition: Home  Diet recommendation: Cardiac fit  Filed Weights   03/11/15 0729 03/13/15 0759 03/18/15 0620  Weight: 63.05 kg (139 lb) 63.05 kg (139 lb) 62.125 kg (136 lb 15.4 oz)    Hospital Course:  She was admitted for a planned repair of her ventral incisional hernia. The day before surgery she had a PICC line  placed due for IV access. She underwent open primary repair of her ventral incisional hernia with laparoscopic placement of intraperitoneal onlay mesh. Postoperatively she was managed with PCA and scheduled muscle relaxant. Exparel had also been injected into her abdominal wall muscle intraoperatively. Her Foley was discontinued on postoperative day 1. However she developed urinary retention and had to have the Foley reinserted. Her Foley was ultimately removed on Monday and she voided without difficulty. She been tolerating clears and her diet was advanced to solid food on Tuesday. Her PCA was stopped and she was started on oral pain medication. She regained bowel function. She was ambulating without difficulty. She had some transient hypotension with systolic around 093. Due to her cardiac history cardiology was consult. Her Lopressor dosage was decreased along with her lisinopril dosage. Once stopping the PCA her blood pressure normalized. She complained of foul-smelling cloudy urine and a urinalysis was performed which revealed urinary tract infection. She was started on ciprofloxacin. She had been managed postoperatively with chemical DVT prophylaxis consisting subcutaneous heparin. She was restarted on her Coumadin on Monday. Her urinary tract infection showed Escherichia coli however sensitivities are still pending at time of discharge. She complained of some ankle edema and the edema she was given 20 of Lasix on Wednesday and diuresed quite well. On Thursday she was deemed stable for discharge. She was tolerating a diet. She was having flatus. She had had a bowel movement.  Her incision was clean dry and intact. Her pain was controlled on oral medication. We discussed discharge instructions. There is no sign of volume overload  BP 125/51 mmHg  Pulse 72  Temp(Src) 98.5 F (36.9 C) (Oral)  Resp 16  Ht 5' 2.5" (1.588 m)  Wt 62.125 kg (136 lb 15.4 oz)  BMI 24.64 kg/m2  SpO2 95%  Gen: alert, NAD,  non-toxic appearing Pupils: equal, no scleral icterus Pulm: Lungs clear to auscultation, symmetric chest rise CV: regular rate and rhythm Abd: soft, min tender, nondistended. Healing incision. No cellulitis. No incisional hernia Ext: no edema, no calf tenderness Skin: no rash, no jaundice    Discharge Instructions      Discharge Instructions    (HEART FAILURE PATIENTS) Call MD:  Anytime you have any of the following symptoms: 1) 3 pound weight gain [name] 24 hours or 5 pounds [name] 1 week 2) shortness of breath, with or without a dry hacking cough 3) swelling [name] the hands, feet or stomach 4) if you have to sleep on extra pillows at night [name] order to breathe.    Complete by:  As directed      Call MD for:  difficulty breathing, headache or visual disturbances    Complete by:  As directed      Call MD for:  persistant dizziness or light-headedness    Complete by:  As directed      Call MD for:  persistant nausea and vomiting    Complete by:  As directed      Call MD for:  redness, tenderness, or signs of infection (pain, swelling, redness, odor or green/yellow discharge around incision site)    Complete by:  As directed      Call MD for:  severe uncontrolled pain    Complete by:  As directed      Diet - low sodium heart healthy    Complete by:  As directed      Discharge instructions    Complete by:  As directed   See [facility] discharge instructions     Driving Restrictions    Complete by:  As directed   No driving while taking pain medication     Increase activity slowly    Complete by:  As directed      Lifting restrictions    Complete by:  As directed   No lifting/pushing/pulling anything greater than 10 pounds for 5 weeks            Medication List    STOP taking these medications        SYSTANE OP      TAKE these medications        acetaminophen 500 MG tablet  Commonly known as:  TYLENOL  Take 500 mg by mouth every 8 (eight) hours as needed for mild pain or moderate  pain.     aspirin EC 81 MG tablet  Take 81 mg by mouth at bedtime.     CALTRATE 600+D 600-800 MG-UNIT Tabs  Generic drug:  Calcium Carb-Cholecalciferol  Take 1 tablet by mouth daily.     cilostazol 100 MG tablet  Commonly known as:  PLETAL  TAKE ONE TABLET BY MOUTH TWICE DAILY     ciprofloxacin 500 MG tablet  Commonly known as:  CIPRO  Take 1 tablet (500 mg total) by mouth 2 (two) times daily.     docusate sodium 100 MG capsule  Commonly known as:  COLACE  Take 100 mg by mouth  daily as needed for mild constipation.     ezetimibe 10 MG tablet  Commonly known as:  ZETIA  Take 1 tablet (10 mg total) by mouth every evening.     hydrocortisone valerate cream 0.2 %  Commonly known as:  WESTCORT  Apply 1 application topically 3 (three) times a week. On random days - for eczema     isosorbide mononitrate 30 MG 24 hr tablet  Commonly known as:  IMDUR  Take 1 tablet (30 mg total) by mouth daily.     lisinopril 20 MG tablet  Commonly known as:  PRINIVIL,ZESTRIL  Take 0.5 tablets (10 mg total) by mouth daily.     metoprolol succinate 25 MG 24 hr tablet  Commonly known as:  TOPROL-XL  Take 0.5 tablets (12.5 mg total) by mouth 2 (two) times daily.     nitroGLYCERIN 0.4 MG SL tablet  Commonly known as:  NITROSTAT  Place 1 tablet (0.4 mg total) under the tongue every 5 (five) minutes x 3 doses as needed for chest pain.     omega-3 acid ethyl esters 1 G capsule  Commonly known as:  LOVAZA  TAKE ONE CAPSULE BY MOUTH DAILY AT NOON     oxyCODONE 5 MG immediate release tablet  Commonly known as:  Oxy IR/ROXICODONE  Take 1-2 tablets (5-10 mg total) by mouth every 4 (four) hours as needed for moderate pain.     polyethylene glycol packet  Commonly known as:  MIRALAX / GLYCOLAX  Take 17 g by mouth daily as needed for mild constipation.     pyridOXINE 100 MG tablet  Commonly known as:  VITAMIN B-6  Take 100 mg by mouth daily.     simvastatin 20 MG tablet  Commonly known as:   ZOCOR  TAKE ONE TABLET BY MOUTH ONCE DAILY     valACYclovir 1000 MG tablet  Commonly known as:  VALTREX  Take 2,000 mg by mouth 2 (two) times daily as needed (outbreaks).     Vitamin D-3 5000 UNITS Tabs  Take 5,000 Units by mouth daily.     warfarin 2.5 MG tablet  Commonly known as:  COUMADIN  TAKE ONE TABLET BY MOUTH ONCE DAILY OR AS DIRECTED     warfarin 2.5 MG tablet  Commonly known as:  COUMADIN  Take 1 to 1.5 tablets by mouth daily as directed by coumadin clinic       Follow-up Information    Follow up with Coumadin Appt On 03/19/2015.   Why:  2:00 PM   Contact information:   [address] [place] [address] [phone]      Follow up with Bridget Curry, MD On 04/07/2015.   Specialty:  General Surgery   Why:  8:45 AM (arrive 8:30 ) , For wound re-check   Contact information:   [address] [place] [address] [phone]       Follow up with [place] [name] 4 days.   Why:  NURSE ONLY APPT For suture removal - my assistant will call you with date and time; [phone]   Contact information:   [address] [place] [address]       Follow up with Bridget Pink, MD. Schedule an appointment as soon as possible for a visit [name] 2 weeks.   Specialty:  Family Medicine   Contact information:   [address] [place] [address] [phone]  The results of significant diagnostics from this hospitalization (including imaging, microbiology, ancillary and laboratory) are listed below for reference.    Significant Diagnostic Studies: Dg Chest 2 View  03/16/2015   CLINICAL DATA:  Patient with shortness of breath on exertion.  EXAM: CHEST  2 VIEW  COMPARISON:  Chest radiograph 12/02/2014  FINDINGS: Right upper extremity PICC line tip projects over the right atrium. Monitoring leads overlie the patient. Stable cardiac and mediastinal contours.  Tortuosity and calcification of the thoracic aorta. Status post CABG procedure. Elevation of the right hemidiaphragm. Minimal heterogeneous opacities right greater than left lung base likely represent atelectasis. Small amount of subcutaneous emphysema within the right lateral chest wall.  IMPRESSION: Small amount of subcutaneous gas within the right lateral chest wall.  Right upper extremity PICC line tip projects over the right atrium.  Elevation right hemidiaphragm with minimal right basilar atelectasis.   Electronically Signed   By: Lovey Newcomer M.D.   On: 03/16/2015 16:22   Ir US Guide Vasc Access Right  03/10/2015   CLINICAL DATA:  POOR VENOUS ACCESS, PREOPERATIVE access for ventral hernia repair tomorrow  EXAM: POWER PICC LINE PLACEMENT WITH ULTRASOUND AND FLUOROSCOPIC GUIDANCE  FLUOROSCOPY TIME:  30 seconds  PROCEDURE: The patient was advised of the possible risks andcomplications and agreed to undergo the procedure. The patient was then brought to the angiographic suite for the procedure.  The right arm was prepped with chlorhexidine, drapedin the usual sterile fashion using maximum barrier technique (cap and mask, sterile gown, sterile gloves, large sterile sheet, hand hygiene and cutaneous antisepsis) and infiltrated locally with 1% Lidocaine.  Ultrasound demonstrated patency of the right basilic vein, and this was documented with an image. Under real-time ultrasound guidance, this vein was accessed with a 21 gauge micropuncture needle and image documentation was performed. A 0.018 wire was introduced [name] to the vein. Over this, a 5 [place] double lumen power PICC was advanced to the lower SVC/right atrial junction. Fluoroscopy during the procedure and fluoro spot radiograph confirms appropriate catheter position. The catheter was flushed and covered with asterile dressing.  Catheter length: 47  Complications: None immediate  IMPRESSION: Successful right arm power PICC line placement with ultrasound and  fluoroscopic guidance. The catheter is ready for use.   Electronically Signed   By: Jerilynn Mages.  Shick M.D.   On: 03/10/2015 13:22   Ir Fluoro Guide Cv Midline Picc Right  03/10/2015   CLINICAL DATA:  POOR VENOUS ACCESS, PREOPERATIVE access for ventral hernia repair tomorrow  EXAM: POWER PICC LINE PLACEMENT WITH ULTRASOUND AND FLUOROSCOPIC GUIDANCE  FLUOROSCOPY TIME:  30 seconds  PROCEDURE: The patient was advised of the possible risks andcomplications and agreed to undergo the procedure. The patient was then brought to the angiographic suite for the procedure.  The right arm was prepped with chlorhexidine, drapedin the usual sterile fashion using maximum barrier technique (cap and mask, sterile gown, sterile gloves, large sterile sheet, hand hygiene and cutaneous antisepsis) and infiltrated locally with 1% Lidocaine.  Ultrasound demonstrated patency of the right basilic vein, and this was documented with an image. Under real-time ultrasound guidance, this vein was accessed with a 21 gauge micropuncture needle and image documentation was performed. A 0.018 wire was introduced [name] to the vein. Over this, a 5 [place] double lumen power PICC was advanced to the lower SVC/right atrial junction. Fluoroscopy during the procedure and fluoro spot radiograph confirms appropriate catheter position. The catheter was flushed and covered with asterile dressing.  Catheter length: 47  Complications: None immediate  IMPRESSION: Successful right arm power PICC line placement with ultrasound and fluoroscopic guidance. The catheter is ready for use.   Electronically Signed   By: Jerilynn Mages.  Shick M.D.   On: 03/10/2015 13:22    Microbiology: Recent Results (from the past 240 hour(s))  Urine culture     Status: None (Preliminary result)   Collection Time: 03/16/15 10:08 AM  Result Value Ref Range Status   Specimen Description URINE, RANDOM  Final   Special Requests NONE  Final   Colony Count   Final    >=100,000 COLONIES/ML Performed at  [facility]    Culture   Final    ESCHERICHIA COLI Performed at [facility]    Report Status PENDING  Incomplete     Labs: Basic Metabolic Panel:  Recent Labs Lab 03/12/15 1053 03/15/15 0516 03/16/15 0515 03/18/15 0430  NA 135 135 133* 135  K 4.3 3.9 4.2 3.6  CL 104 104 101 99*  CO2 '24 26 24 28  '$ GLUCOSE 136* 125* 115* 106*  BUN 7 <5* <5* 5*  CREATININE 0.74 0.38* 0.44 0.49  CALCIUM 7.4* 7.1* 7.2* 7.8*  MG  --   --  2.0 2.0   Liver Function Tests: No results for input(s): AST, ALT, ALKPHOS, BILITOT, PROT, ALBUMIN [name] the last 168 hours. No results for input(s): LIPASE, AMYLASE [name] the last 168 hours. No results for input(s): AMMONIA [name] the last 168 hours. CBC:  Recent Labs Lab 03/12/15 1053 03/15/15 0516 03/16/15 0515  WBC 9.9 5.2 5.9  NEUTROABS  --  2.8  --   HGB 12.1 10.1* 9.8*  HCT 36.6 29.8* 29.3*  MCV 91.3 91.1 91.6  PLT 178 182 187   Cardiac Enzymes: No results for input(s): CKTOTAL, CKMB, CKMBINDEX, TROPONINI [name] the last 168 hours. BNP: BNP (last 3 results) No results for input(s): BNP [name] the last 8760 hours.  ProBNP (last 3 results) No results for input(s): PROBNP [name] the last 8760 hours.  CBG: No results for input(s): GLUCAP [name] the last 168 hours.  Active Problems:   Paroxysmal atrial fibrillation, maintaining SR on coumadin    CAD (coronary artery disease) with CABG   Chronic systolic CHF (congestive heart failure)   History of incisional hernia repair   Hypotension   PAF (paroxysmal atrial fibrillation)   Time coordinating discharge: 25 minutes  Signed:  Gayland Curry, MD [facility], [place] [phone] 03/18/2015, 1:29 PM

## 2015-03-18 NOTE — Discharge Instructions (Signed)
[facility], [place] [phone]  OPEN ABDOMINAL SURGERY: POST OP INSTRUCTIONS  Always review your discharge instruction sheet given to you by the facility where your surgery was performed.  IF YOU HAVE DISABILITY OR FAMILY LEAVE FORMS, YOU MUST BRING THEM TO THE OFFICE FOR PROCESSING.  PLEASE DO NOT GIVE THEM TO YOUR DOCTOR.  1. A prescription for pain medication may be given to you upon discharge.  Take your pain medication as prescribed, if needed.  If narcotic pain medicine is not needed, then you may take acetaminophen (Tylenol) or ibuprofen (Advil) as needed. 2. Take your usually prescribed medications unless otherwise directed. 3. If you need a refill on your pain medication, please contact your pharmacy. They will contact our office to request authorization.  Prescriptions will not be filled after 5pm or on week-ends. 4. You should follow a light diet the first few days after arrival home, such as soup and crackers, pudding, etc.unless your doctor has advised otherwise. A high-fiber, low fat diet can be resumed as tolerated.   Be sure to include lots of fluids daily.  5. Most patients will experience some swelling and bruising in the area of the incision. Ice pack will help. Swelling and bruising can take several days to resolve..  6. It is common to experience some constipation if taking pain medication after surgery.  Increasing fluid intake and taking a stool softener will usually help or prevent this problem from occurring.  A mild laxative (Milk of Magnesia or Miralax) should be taken according to package directions if there are no bowel movements after 48 hours. 7.  You may have steri-strips (small skin tapes) in place directly over the incision.  These strips should be left on the skin for 7-10 days.    Any sutures or staples will be removed at the office during your follow-up visit. You may find that a light gauze bandage over your incision may keep your staples from  being rubbed or pulled. You may shower and replace the bandage daily. 8. ACTIVITIES:  You may resume regular (light) daily activities beginning the next day--such as daily self-care, walking, climbing stairs--gradually increasing activities as tolerated.  You may have sexual intercourse when it is comfortable.  Refrain from any heavy lifting or straining until approved by your doctor. a. You may drive when you no longer are taking prescription pain medication, you can comfortably wear a seatbelt, and you can safely maneuver your car and apply brakes b. Return to Work: ___________________________________ 33. You should see your doctor in the office for a follow-up appointment approximately two weeks after your surgery.  Make sure that you call for this appointment within a day or two after you arrive home to insure a convenient appointment time. OTHER INSTRUCTIONS:  Wear abdominal binder while up walking around for 30 days Follow up with coumadin clinic as instructed  WHEN TO CALL YOUR DOCTOR: 1. Fever over 101.0 2. Inability to urinate 3. Nausea and/or vomiting 4. Extreme swelling or bruising 5. Continued bleeding from incision. 6. Increased pain, redness, or drainage from the incision. 7. Difficulty swallowing or breathing 8. Muscle cramping or spasms. 9. Numbness or tingling in hands or feet or around lips.  The clinic staff is available to answer your questions during regular business hours.  Please dont hesitate to call and ask to speak to one of the nurses if you have concerns.  For further questions, please visit [email or web address]

## 2015-03-18 NOTE — Progress Notes (Signed)
ANTICOAGULATION CONSULT NOTE - Follow Up Consult  Pharmacy Consult for Coumadin Indication: atrial fibrillation  Allergies  Allergen Reactions  . Crestor [Rosuvastatin]     Leg pain  . Diltiazem     Weakness on oral Dilt  . Lipitor [Atorvastatin]     Leg pain  . Penicillins Other (See Comments)    Blurred vision  . Phenergan [Promethazine Hcl] Other (See Comments)    Nervous Leg / Restless Leg Syndrome  . Promethazine Other (See Comments)    Uncontrolled leg shaking  . Reclast [Zoledronic Acid] Other (See Comments)    Flu symptoms - made pt very sick, and had inflammation  In her eye  . Latex Rash  . Sulfa Antibiotics Photosensitivity and Rash    Burning Rash    Patient Measurements: Height: 5' 2.5" (158.8 cm) Weight: 136 lb 15.4 oz (62.125 kg) IBW/kg (Calculated) : 51.25 Heparin Dosing Weight:   Vital Signs: Temp: 98.5 F (36.9 C) (05/05 0620) BP: 125/51 mmHg (05/05 0620) Pulse Rate: 72 (05/05 0620)  Labs:  Recent Labs  03/16/15 0515 03/17/15 0514 03/18/15 0430  HGB 9.8*  --   --   HCT 29.3*  --   --   PLT 187  --   --   LABPROT 15.8* 15.8* 15.4*  INR 1.25 1.25 1.20  CREATININE 0.44  --  0.49    Estimated Creatinine Clearance: 57.4 mL/min (by C-G formula based on Cr of 0.49).   Medications:  Scheduled:  . ciprofloxacin  500 mg Oral BID  . docusate sodium  100 mg Oral BID  . ezetimibe  10 mg Oral QPM  . feeding supplement (ENSURE)  1 Container Oral Q24H  . heparin subcutaneous  5,000 Units Subcutaneous 3 times per day  . isosorbide mononitrate  30 mg Oral Daily  . lisinopril  10 mg Oral Daily  . methocarbamol (ROBAXIN)  IV  500 mg Intravenous 3 times per day  . metoprolol succinate  12.5 mg Oral BID  . polyethylene glycol  17 g Oral Daily  . sodium chloride  10-40 mL Intracatheter Q12H  . tamsulosin  0.4 mg Oral Daily  . Warfarin - Pharmacist Dosing Inpatient   Does not apply q1800    Assessment: 69yo female with AFib, resuming Coumadin s/p  ventral hernia repair.  INR 1.2 this AM- not moving, all doses charted.  Pt on Cipro, which may increase Coumadin effect.  Heparin SQ until INR therapeutic.  No bleeding noted.  Goal of Therapy:  INR 2-3 Monitor platelets by anticoagulation protocol: Yes   Plan:  Repeat Coumadin '5mg'$  today Possibly d/c home  Gracy Bruins, [facility]

## 2015-03-19 ENCOUNTER — Encounter (INDEPENDENT_AMBULATORY_CARE_PROVIDER_SITE_OTHER): Payer: Medicare Other

## 2015-03-19 ENCOUNTER — Ambulatory Visit (INDEPENDENT_AMBULATORY_CARE_PROVIDER_SITE_OTHER): Payer: Medicare Other | Admitting: Pharmacist Clinician (PhC)/ Clinical Pharmacy Specialist

## 2015-03-19 DIAGNOSIS — I4891 Unspecified atrial fibrillation: Secondary | ICD-10-CM | POA: Diagnosis not present

## 2015-03-19 DIAGNOSIS — Z7901 Long term (current) use of anticoagulants: Secondary | ICD-10-CM | POA: Diagnosis not present

## 2015-03-19 DIAGNOSIS — I480 Paroxysmal atrial fibrillation: Secondary | ICD-10-CM | POA: Diagnosis not present

## 2015-03-19 LAB — POCT INR: INR: 1.3

## 2015-03-25 ENCOUNTER — Ambulatory Visit (INDEPENDENT_AMBULATORY_CARE_PROVIDER_SITE_OTHER): Payer: Medicare Other | Admitting: Pharmacist Clinician (PhC)/ Clinical Pharmacy Specialist

## 2015-03-25 DIAGNOSIS — Z7901 Long term (current) use of anticoagulants: Secondary | ICD-10-CM

## 2015-03-25 DIAGNOSIS — I480 Paroxysmal atrial fibrillation: Secondary | ICD-10-CM

## 2015-03-25 DIAGNOSIS — I4891 Unspecified atrial fibrillation: Secondary | ICD-10-CM | POA: Diagnosis not present

## 2015-03-25 LAB — POCT INR: INR: 1.6

## 2015-03-26 ENCOUNTER — Ambulatory Visit: Payer: Medicare Other | Admitting: Pharmacist Clinician (PhC)/ Clinical Pharmacy Specialist

## 2015-03-30 DIAGNOSIS — I10 Essential (primary) hypertension: Secondary | ICD-10-CM | POA: Diagnosis not present

## 2015-03-30 DIAGNOSIS — N390 Urinary tract infection, site not specified: Secondary | ICD-10-CM | POA: Diagnosis not present

## 2015-04-08 ENCOUNTER — Ambulatory Visit (INDEPENDENT_AMBULATORY_CARE_PROVIDER_SITE_OTHER): Payer: Medicare Other | Admitting: Pharmacist Clinician (PhC)/ Clinical Pharmacy Specialist

## 2015-04-08 ENCOUNTER — Ambulatory Visit: Payer: Medicare Other | Admitting: Pharmacist Clinician (PhC)/ Clinical Pharmacy Specialist

## 2015-04-08 DIAGNOSIS — I4891 Unspecified atrial fibrillation: Secondary | ICD-10-CM

## 2015-04-08 DIAGNOSIS — I480 Paroxysmal atrial fibrillation: Secondary | ICD-10-CM

## 2015-04-08 DIAGNOSIS — Z7901 Long term (current) use of anticoagulants: Secondary | ICD-10-CM

## 2015-04-08 DIAGNOSIS — N390 Urinary tract infection, site not specified: Secondary | ICD-10-CM | POA: Diagnosis not present

## 2015-04-08 LAB — POCT INR: INR: 2.3

## 2015-04-14 ENCOUNTER — Encounter: Payer: Self-pay | Admitting: Pharmacist

## 2015-04-14 ENCOUNTER — Ambulatory Visit: Payer: Medicare Other | Admitting: Physical Therapy

## 2015-04-28 ENCOUNTER — Ambulatory Visit: Payer: Medicare Other | Attending: General Surgery | Admitting: Physical Therapy

## 2015-04-28 VITALS — BP 120/82

## 2015-04-28 DIAGNOSIS — R279 Unspecified lack of coordination: Secondary | ICD-10-CM | POA: Diagnosis not present

## 2015-04-28 DIAGNOSIS — M629 Disorder of muscle, unspecified: Secondary | ICD-10-CM | POA: Diagnosis not present

## 2015-04-28 NOTE — Patient Instructions (Signed)
  1) diaphragmatic and pelvic floor breathing.   Hand on chest and one on belly to ensure more ribcage expansion on inhale, less on chest. Exhale ribcage lowers to feet, belly sinks, and pelvic floor lifts to heart.   Make yourself aware of this pelvic  muscle group by using these cues while coordinating your breath: Inhale, feel pelvic floor diamond area lower like hammock towards your feet and ribcage/belly expanding. Pause. Let the exhale naturally and feel your belly sink, abdominal muscles hugging in around you and you may notice the pelvic diamond draws upward towards your head forming a umbrella shape.    2) lifting in semi tandem stance instead of feet together. Exhale on lift   3)              DECREASE DOWNWARD PRESSURE ON  YOUR PELVIC FLOOR, ABDOMINAL, LOW BACK MUSCLES       PRESERVE YOUR PELVIC HEALTH LONG-TERM   ** SQUEEZE pelvic floor BEFORE YOUR SNEEZE, COUGH, LAUGH   ** EXHALE BEFORE YOU RISE AGAINST GRAVITY (lifting, sit to stand, from squat to stand)   ** LOG ROLL OUT OF BED INSTEAD OF CRUNCH/SIT-UP    You are now ready to begin training the deep core muscles system: diaphragm, transverse abdominis, pelvic floor . These muscles must work together as a team.           The key to these exercises to train the brain to coordinate the timing of these muscles and to have them turn on for long periods of time to hold you upright against gravity (especially important if you are on your feet all day).These muscles are postural muscles and play a role stabilizing your spine and bodyweight. By doing these repetitions slowly and correctly instead of doing crunches, you will achieve a flatter belly without a lower pooch. You are also placing your spine in a more neutral position and breathing properly which in turn, decreases your risk for problems related to your pelvic floor, abdominal, and low back such as pelvic organ prolapse, hernias, diastasis recti (separation of  superficial muscles), disk herniations, spinal fractures. These exercises set a solid foundation for you to later progress to resistance/ strength training with therabands and weights and return to other typical fitness exercises with a stronger deeper core.                                                  Preserve the function of your pelvic floor, abdomen, and back.              Avoid decreased straining of abdominal/pelvic floor muscles with less              slouching,  holding your breath with lifting/bowel movements)                                                     FUNCTIONAL POSTURES
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
## 2015-05-07 ENCOUNTER — Ambulatory Visit: Payer: Medicare Other | Admitting: Pharmacist Clinician (PhC)/ Clinical Pharmacy Specialist

## 2015-05-10 ENCOUNTER — Ambulatory Visit: Payer: Medicare Other | Admitting: Physical Therapy

## 2015-05-10 DIAGNOSIS — M629 Disorder of muscle, unspecified: Secondary | ICD-10-CM | POA: Diagnosis not present

## 2015-05-10 DIAGNOSIS — R279 Unspecified lack of coordination: Secondary | ICD-10-CM | POA: Diagnosis not present

## 2015-05-11 ENCOUNTER — Ambulatory Visit (INDEPENDENT_AMBULATORY_CARE_PROVIDER_SITE_OTHER): Payer: Medicare Other | Admitting: Pharmacist Clinician (PhC)/ Clinical Pharmacy Specialist

## 2015-05-11 DIAGNOSIS — Z7901 Long term (current) use of anticoagulants: Secondary | ICD-10-CM

## 2015-05-11 DIAGNOSIS — I480 Paroxysmal atrial fibrillation: Secondary | ICD-10-CM

## 2015-05-11 DIAGNOSIS — I4891 Unspecified atrial fibrillation: Secondary | ICD-10-CM | POA: Diagnosis not present

## 2015-05-11 LAB — POCT INR: INR: 2.3
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
## 2015-05-11 NOTE — Patient Instructions (Signed)
Handout on abdominal massage. Guided pt on technique. Explained benefits.  Sit to stand with exhale Scar massage along abdominal scar

## 2015-05-12 ENCOUNTER — Encounter: Payer: Medicare Other | Admitting: Physical Therapy

## 2015-05-13 ENCOUNTER — Ambulatory Visit: Payer: Medicare Other | Admitting: Physical Therapy

## 2015-05-13 DIAGNOSIS — R279 Unspecified lack of coordination: Secondary | ICD-10-CM

## 2015-05-13 DIAGNOSIS — M629 Disorder of muscle, unspecified: Secondary | ICD-10-CM | POA: Diagnosis not present

## 2015-05-13 NOTE — Patient Instructions (Signed)
1. Keep massage nightly 2a. Keep walking 1 mi early morning/ late afternoon to prevent overheating 2b. Post-walking stretches: figure -4 with arms behind hips on chair and warrior I with opposite arms (5 breaths)  3. Sit to stand with red band 10x/ 2x days 4. Grape vine down hallway 5. Shake loose for lymp flow 6. Squat position for picking items from the floor with exhale up and feet and knees wide
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
## 2015-05-18 ENCOUNTER — Ambulatory Visit: Payer: Medicare Other | Attending: General Surgery | Admitting: Physical Therapy

## 2015-05-18 DIAGNOSIS — M629 Disorder of muscle, unspecified: Secondary | ICD-10-CM

## 2015-05-18 DIAGNOSIS — R531 Weakness: Secondary | ICD-10-CM | POA: Insufficient documentation

## 2015-05-18 DIAGNOSIS — R279 Unspecified lack of coordination: Secondary | ICD-10-CM | POA: Diagnosis not present
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
## 2015-05-18 NOTE — Patient Instructions (Signed)
Handout for open book      You are now ready to begin training the deep core muscles system: diaphragm, transverse abdominis, pelvic floor . These muscles must work together as a team.           The key to these exercises to train the brain to coordinate the timing of these muscles and to have them turn on for long periods of time to hold you upright against gravity (especially important if you are on your feet all day).These muscles are postural muscles and play a role stabilizing your spine and bodyweight. By doing these repetitions slowly and correctly instead of doing crunches, you will achieve a flatter belly without a lower pooch. You are also placing your spine in a more neutral position and breathing properly which in turn, decreases your risk for problems related to your pelvic floor, abdominal, and low back such as pelvic organ prolapse, hernias, diastasis recti (separation of superficial muscles), disk herniations, spinal fractures. These exercises set a solid foundation for you to later progress to resistance/ strength training with therabands and weights and return to other typical fitness exercises with a stronger deeper core.    Only perform level 1-2

## 2015-05-20 ENCOUNTER — Encounter: Payer: Medicare Other | Admitting: Physical Therapy

## 2015-05-24 ENCOUNTER — Ambulatory Visit: Payer: Medicare Other | Admitting: Physical Therapy

## 2015-05-24 DIAGNOSIS — M629 Disorder of muscle, unspecified: Secondary | ICD-10-CM | POA: Diagnosis not present

## 2015-05-24 DIAGNOSIS — R531 Weakness: Secondary | ICD-10-CM | POA: Diagnosis not present

## 2015-05-24 DIAGNOSIS — R279 Unspecified lack of coordination: Secondary | ICD-10-CM

## 2015-05-25 NOTE — Patient Instructions (Addendum)
  Brainstormed on decreasing buying 24 pack of plastic bottles due to lifting limitations  Provided website for [employer] filter.  Practice pelvic floor ROM w/ breathing   You are now ready to begin training the deep core muscles system: diaphragm, transverse abdominis, pelvic floor . These muscles must work together as a team.      The key to these exercises to train the brain to coordinate the timing of these muscles and to have them turn on for long periods of time to hold you upright against gravity (especially important if you are on your feet all day).These muscles are postural muscles and play a role stabilizing your spine and bodyweight. By doing these repetitions slowly and correctly instead of doing crunches, you will achieve a flatter belly without a lower pooch. You are also placing your spine in a more neutral position and breathing properly which in turn, decreases your risk for problems related to your pelvic floor, abdominal, and low back such as pelvic organ prolapse, hernias, diastasis recti (separation of superficial muscles), disk herniations, spinal fractures. These exercises set a solid foundation for you to later progress to resistance/ strength training with therabands and weights and return to other typical fitness exercises with a stronger deeper core.   Perform only level 1-2
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
## 2015-06-01 ENCOUNTER — Ambulatory Visit: Payer: Medicare Other | Admitting: Physical Therapy

## 2015-06-01 DIAGNOSIS — M629 Disorder of muscle, unspecified: Secondary | ICD-10-CM

## 2015-06-01 DIAGNOSIS — R531 Weakness: Secondary | ICD-10-CM | POA: Diagnosis not present

## 2015-06-01 DIAGNOSIS — R279 Unspecified lack of coordination: Secondary | ICD-10-CM

## 2015-06-02 ENCOUNTER — Encounter: Payer: Medicare Other | Admitting: Physical Therapy

## 2015-06-02 NOTE — Patient Instructions (Signed)
Handout on back stretches: down dog and thread the needle to L shin in down dog position by chair.
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
## 2015-06-03 ENCOUNTER — Encounter: Payer: Self-pay | Admitting: Cardiovascular Disease

## 2015-06-03 ENCOUNTER — Ambulatory Visit (INDEPENDENT_AMBULATORY_CARE_PROVIDER_SITE_OTHER): Payer: Medicare Other | Admitting: Pharmacist Clinician (PhC)/ Clinical Pharmacy Specialist

## 2015-06-03 ENCOUNTER — Ambulatory Visit (INDEPENDENT_AMBULATORY_CARE_PROVIDER_SITE_OTHER): Payer: Medicare Other | Admitting: Cardiovascular Disease

## 2015-06-03 VITALS — BP 142/82 | HR 51 | Ht 63.0 in | Wt 141.0 lb

## 2015-06-03 DIAGNOSIS — I2510 Atherosclerotic heart disease of native coronary artery without angina pectoris: Secondary | ICD-10-CM

## 2015-06-03 DIAGNOSIS — Z79899 Other long term (current) drug therapy: Secondary | ICD-10-CM | POA: Diagnosis not present

## 2015-06-03 DIAGNOSIS — E785 Hyperlipidemia, unspecified: Secondary | ICD-10-CM

## 2015-06-03 DIAGNOSIS — I480 Paroxysmal atrial fibrillation: Secondary | ICD-10-CM

## 2015-06-03 DIAGNOSIS — I4891 Unspecified atrial fibrillation: Secondary | ICD-10-CM | POA: Diagnosis not present

## 2015-06-03 DIAGNOSIS — Z7901 Long term (current) use of anticoagulants: Secondary | ICD-10-CM

## 2015-06-03 DIAGNOSIS — I10 Essential (primary) hypertension: Secondary | ICD-10-CM | POA: Diagnosis not present

## 2015-06-03 DIAGNOSIS — I2583 Coronary atherosclerosis due to lipid rich plaque: Secondary | ICD-10-CM

## 2015-06-03 LAB — POCT INR: INR: 1.6

## 2015-06-03 NOTE — Patient Instructions (Signed)
Your physician recommends that you schedule a follow-up appointment in: 6 Months  Your physician recommends that you return for lab work in: Fast Lipids, CMP, CBC, TSH
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
## 2015-06-09 ENCOUNTER — Ambulatory Visit: Payer: Medicare Other | Admitting: Physical Therapy

## 2015-06-14 ENCOUNTER — Ambulatory Visit (INDEPENDENT_AMBULATORY_CARE_PROVIDER_SITE_OTHER): Payer: Medicare Other | Admitting: Pharmacist Clinician (PhC)/ Clinical Pharmacy Specialist

## 2015-06-14 DIAGNOSIS — Z7901 Long term (current) use of anticoagulants: Secondary | ICD-10-CM | POA: Diagnosis not present

## 2015-06-14 DIAGNOSIS — I4891 Unspecified atrial fibrillation: Secondary | ICD-10-CM | POA: Diagnosis not present

## 2015-06-14 DIAGNOSIS — I480 Paroxysmal atrial fibrillation: Secondary | ICD-10-CM | POA: Diagnosis not present

## 2015-06-14 LAB — POCT INR: INR: 1.7

## 2015-06-16 ENCOUNTER — Ambulatory Visit: Payer: Medicare Other | Attending: General Surgery | Admitting: Physical Therapy

## 2015-06-16 DIAGNOSIS — R279 Unspecified lack of coordination: Secondary | ICD-10-CM | POA: Diagnosis not present

## 2015-06-16 DIAGNOSIS — M629 Disorder of muscle, unspecified: Secondary | ICD-10-CM
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
## 2015-06-23 ENCOUNTER — Ambulatory Visit: Payer: Medicare Other | Admitting: Physical Therapy

## 2015-06-23 DIAGNOSIS — R279 Unspecified lack of coordination: Secondary | ICD-10-CM | POA: Diagnosis not present

## 2015-06-23 DIAGNOSIS — M629 Disorder of muscle, unspecified: Secondary | ICD-10-CM

## 2015-06-23 NOTE — Patient Instructions (Addendum)
Flexibility routine: in morning, evening and after walking  5 easy lengthened breaths in each position   Front: Warrior  Back: Child's pose, childs' pose rocking  Side body:  child's pose side stretch  Or by wall  Hips:  Figure -4     Add  Grape vine and seated trunk twists for rotation of trunk   Practice  Walking with trunk rotation and arm swing

## 2015-06-24 ENCOUNTER — Telehealth: Payer: Self-pay | Admitting: Cardiovascular Disease

## 2015-06-24 MED ORDER — METOPROLOL SUCCINATE ER 25 MG PO TB24: 12.5000 mg | ORAL_TABLET | Freq: Two times a day (BID) | ORAL | Status: DC
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
## 2015-06-24 NOTE — Telephone Encounter (Signed)
°  1. Which medications need to be refilled? Metoprolol 25 mg  2. Which pharmacy is medication to be sent to?[facility]-[phone]  3. Do they need a 30 day or 90 day supply? 30 and refills  4. Would they like a call back once the medication has been sent to the pharmacy? yes

## 2015-06-24 NOTE — Telephone Encounter (Signed)
Pt aware refill sent to the pharmacy electronically

## 2015-06-28 ENCOUNTER — Ambulatory Visit (INDEPENDENT_AMBULATORY_CARE_PROVIDER_SITE_OTHER): Payer: Medicare Other | Admitting: Pharmacist Clinician (PhC)/ Clinical Pharmacy Specialist

## 2015-06-28 DIAGNOSIS — I4891 Unspecified atrial fibrillation: Secondary | ICD-10-CM

## 2015-06-28 DIAGNOSIS — I480 Paroxysmal atrial fibrillation: Secondary | ICD-10-CM

## 2015-06-28 DIAGNOSIS — Z7901 Long term (current) use of anticoagulants: Secondary | ICD-10-CM | POA: Diagnosis not present

## 2015-06-28 LAB — POCT INR: INR: 2.6

## 2015-07-07 ENCOUNTER — Ambulatory Visit: Payer: Medicare Other | Admitting: Physical Therapy

## 2015-07-07 DIAGNOSIS — M629 Disorder of muscle, unspecified: Secondary | ICD-10-CM

## 2015-07-07 DIAGNOSIS — R279 Unspecified lack of coordination: Secondary | ICD-10-CM
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
## 2015-07-14 ENCOUNTER — Ambulatory Visit: Payer: Medicare Other | Admitting: Physical Therapy

## 2015-07-14 DIAGNOSIS — B309 Viral conjunctivitis, unspecified: Secondary | ICD-10-CM | POA: Diagnosis not present

## 2015-07-21 ENCOUNTER — Ambulatory Visit: Payer: Medicare Other | Attending: General Surgery | Admitting: Physical Therapy

## 2015-07-21 DIAGNOSIS — R279 Unspecified lack of coordination: Secondary | ICD-10-CM | POA: Diagnosis not present

## 2015-07-21 DIAGNOSIS — M629 Disorder of muscle, unspecified: Secondary | ICD-10-CM

## 2015-07-21 NOTE — Patient Instructions (Signed)
Mini lunges by counter  10x reps /day    Low lunges by bed/chair  Right leg forward , bed/chair on left side  3 reps/ day   Drawings on handout with cues written
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
## 2015-07-23 ENCOUNTER — Ambulatory Visit (INDEPENDENT_AMBULATORY_CARE_PROVIDER_SITE_OTHER): Payer: Medicare Other | Admitting: Pharmacist Clinician (PhC)/ Clinical Pharmacy Specialist

## 2015-07-23 DIAGNOSIS — I4891 Unspecified atrial fibrillation: Secondary | ICD-10-CM | POA: Diagnosis not present

## 2015-07-23 DIAGNOSIS — I480 Paroxysmal atrial fibrillation: Secondary | ICD-10-CM | POA: Diagnosis not present

## 2015-07-23 DIAGNOSIS — Z7901 Long term (current) use of anticoagulants: Secondary | ICD-10-CM

## 2015-07-23 DIAGNOSIS — E785 Hyperlipidemia, unspecified: Secondary | ICD-10-CM | POA: Diagnosis not present

## 2015-07-23 DIAGNOSIS — I10 Essential (primary) hypertension: Secondary | ICD-10-CM | POA: Diagnosis not present

## 2015-07-23 DIAGNOSIS — Z79899 Other long term (current) drug therapy: Secondary | ICD-10-CM | POA: Diagnosis not present

## 2015-07-23 LAB — CBC
HCT: 41.3 % (ref 36.0–46.0)
Hemoglobin: 14.1 g/dL (ref 12.0–15.0)
MCH: 30.6 pg (ref 26.0–34.0)
MCHC: 34.1 g/dL (ref 30.0–36.0)
MCV: 89.6 fL (ref 78.0–100.0)
MPV: 8.1 fL — ABNORMAL LOW (ref 8.6–12.4)
Platelets: 225 10*3/uL (ref 150–400)
RBC: 4.61 MIL/uL (ref 3.87–5.11)
RDW: 15.3 % (ref 11.5–15.5)
WBC: 8.8 10*3/uL (ref 4.0–10.5)

## 2015-07-23 LAB — POCT INR: INR: 1.5

## 2015-07-24 LAB — LIPID PANEL
Cholesterol: 174 mg/dL (ref 125–200)
HDL: 76 mg/dL (ref >=46)
LDL Cholesterol: 84 mg/dL (ref <130)
Total CHOL/HDL Ratio: 2.3 Ratio (ref <=5.0)
Triglycerides: 68 mg/dL (ref <150)
VLDL: 14 mg/dL (ref <30)

## 2015-07-24 LAB — COMPLETE METABOLIC PANEL WITH GFR
ALT: 22 U/L (ref 6–29)
AST: 25 U/L (ref 10–35)
Albumin: 4.1 g/dL (ref 3.6–5.1)
Alkaline Phosphatase: 41 U/L (ref 33–130)
BUN: 10 mg/dL (ref 7–25)
CO2: 26 mmol/L (ref 20–31)
Calcium: 8.8 mg/dL (ref 8.6–10.4)
Chloride: 96 mmol/L — ABNORMAL LOW (ref 98–110)
Creat: 0.52 mg/dL — ABNORMAL LOW (ref 0.60–0.93)
GFR, Est African American: >89 mL/min (ref >=60)
GFR, Est Non African American: >89 mL/min (ref >=60)
Glucose, Bld: 83 mg/dL (ref 65–99)
Potassium: 4.6 mmol/L (ref 3.5–5.3)
Sodium: 134 mmol/L — ABNORMAL LOW (ref 135–146)
Total Bilirubin: 0.6 mg/dL (ref 0.2–1.2)
Total Protein: 6.3 g/dL (ref 6.1–8.1)

## 2015-07-24 LAB — TSH: TSH: 1.502 u[IU]/mL (ref 0.350–4.500)

## 2015-07-27 ENCOUNTER — Ambulatory Visit: Payer: Medicare Other | Admitting: Physical Therapy

## 2015-07-27 DIAGNOSIS — R279 Unspecified lack of coordination: Secondary | ICD-10-CM

## 2015-07-27 DIAGNOSIS — M629 Disorder of muscle, unspecified: Secondary | ICD-10-CM

## 2015-07-27 NOTE — Patient Instructions (Addendum)
Cross diagonal D2 ext yellow band  "Lawnmover"  1st week: 10 rep 2 sets with yellow band 2nd week 5 reps 1 set with red band    _______________   Dynamic Stabilization 2,3, 4  * add opposite arm overhead with heel slide = Dynamic 4    ________________  Handout provided for exercises above

## 2015-07-28 ENCOUNTER — Telehealth: Payer: Self-pay | Admitting: Cardiovascular Disease

## 2015-07-28 NOTE — Telephone Encounter (Signed)
Labs are very good, can send her results

## 2015-07-28 NOTE — Telephone Encounter (Signed)
Returned call to patient no answer.Left message on personal voice mail lab results not available,will send message to [facility].
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
## 2015-07-28 NOTE — Telephone Encounter (Signed)
Pt called in stating that she had some labs done on 9/9 and she would like to know if those results were available. Please f/u with her   Thanks

## 2015-07-29 NOTE — Telephone Encounter (Signed)
Returned call to patient lab results given.Copy of lab mailed to patient.

## 2015-08-04 ENCOUNTER — Encounter: Payer: Medicare Other | Admitting: Physical Therapy

## 2015-08-05 DIAGNOSIS — I10 Essential (primary) hypertension: Secondary | ICD-10-CM | POA: Diagnosis not present

## 2015-08-05 DIAGNOSIS — Z09 Encounter for follow-up examination after completed treatment for conditions other than malignant neoplasm: Secondary | ICD-10-CM | POA: Diagnosis not present

## 2015-08-05 DIAGNOSIS — E785 Hyperlipidemia, unspecified: Secondary | ICD-10-CM | POA: Diagnosis not present

## 2015-08-06 DIAGNOSIS — Z23 Encounter for immunization: Secondary | ICD-10-CM | POA: Diagnosis not present

## 2015-08-09 ENCOUNTER — Ambulatory Visit (INDEPENDENT_AMBULATORY_CARE_PROVIDER_SITE_OTHER): Payer: Medicare Other | Admitting: Pharmacist Clinician (PhC)/ Clinical Pharmacy Specialist

## 2015-08-09 DIAGNOSIS — I480 Paroxysmal atrial fibrillation: Secondary | ICD-10-CM

## 2015-08-09 DIAGNOSIS — Z7901 Long term (current) use of anticoagulants: Secondary | ICD-10-CM | POA: Diagnosis not present

## 2015-08-09 DIAGNOSIS — I4891 Unspecified atrial fibrillation: Secondary | ICD-10-CM | POA: Diagnosis not present

## 2015-08-09 LAB — POCT INR: INR: 3.1

## 2015-08-11 ENCOUNTER — Ambulatory Visit: Payer: Medicare Other | Admitting: Physical Therapy

## 2015-08-11 DIAGNOSIS — M629 Disorder of muscle, unspecified: Secondary | ICD-10-CM

## 2015-08-11 DIAGNOSIS — R279 Unspecified lack of coordination: Secondary | ICD-10-CM

## 2015-08-11 NOTE — Patient Instructions (Signed)
D1 flexion "pulling a seatbelt" 10 rep /1 x day  The other band exercise "lawnmower" 10 rep/ 1 xday     Progress these after 1 month, then 10x 2sets, another month, 10 x 3 sets for another month,   After 3rd month, then 5 reps x 1 set with red band
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
## 2015-08-24 ENCOUNTER — Other Ambulatory Visit: Payer: Self-pay | Admitting: Cardiovascular Disease

## 2015-08-24 NOTE — Telephone Encounter (Signed)
REFILL 

## 2015-08-25 ENCOUNTER — Ambulatory Visit (INDEPENDENT_AMBULATORY_CARE_PROVIDER_SITE_OTHER): Payer: Medicare Other | Admitting: Pharmacist

## 2015-08-25 DIAGNOSIS — I4891 Unspecified atrial fibrillation: Secondary | ICD-10-CM

## 2015-08-25 DIAGNOSIS — I480 Paroxysmal atrial fibrillation: Secondary | ICD-10-CM

## 2015-08-25 DIAGNOSIS — Z7901 Long term (current) use of anticoagulants: Secondary | ICD-10-CM | POA: Diagnosis not present

## 2015-08-25 LAB — POCT INR: INR: 2.3

## 2015-08-31 DIAGNOSIS — Z85828 Personal history of other malignant neoplasm of skin: Secondary | ICD-10-CM | POA: Diagnosis not present

## 2015-08-31 DIAGNOSIS — L570 Actinic keratosis: Secondary | ICD-10-CM | POA: Diagnosis not present

## 2015-08-31 DIAGNOSIS — X32XXXA Exposure to sunlight, initial encounter: Secondary | ICD-10-CM | POA: Diagnosis not present

## 2015-08-31 DIAGNOSIS — D225 Melanocytic nevi of trunk: Secondary | ICD-10-CM | POA: Diagnosis not present

## 2015-08-31 DIAGNOSIS — B001 Herpesviral vesicular dermatitis: Secondary | ICD-10-CM | POA: Diagnosis not present

## 2015-08-31 DIAGNOSIS — D2371 Other benign neoplasm of skin of right lower limb, including hip: Secondary | ICD-10-CM | POA: Diagnosis not present

## 2015-09-03 ENCOUNTER — Other Ambulatory Visit: Payer: Self-pay | Admitting: Cardiovascular Disease

## 2015-09-06 DIAGNOSIS — Z23 Encounter for immunization: Secondary | ICD-10-CM | POA: Diagnosis not present

## 2015-09-12 ENCOUNTER — Emergency Department (HOSPITAL_COMMUNITY): Payer: Medicare Other

## 2015-09-12 ENCOUNTER — Emergency Department (HOSPITAL_COMMUNITY)
Admission: EM | Admit: 2015-09-12 | Discharge: 2015-09-12 | Disposition: A | Discharge status: Home / Self Care | Payer: Medicare Other | Attending: Emergency Medicine | Admitting: Emergency Medicine

## 2015-09-12 ENCOUNTER — Encounter (HOSPITAL_COMMUNITY): Payer: Self-pay | Admitting: Emergency Medicine

## 2015-09-12 DIAGNOSIS — Y9389 Activity, other specified: Secondary | ICD-10-CM | POA: Insufficient documentation

## 2015-09-12 DIAGNOSIS — R011 Cardiac murmur, unspecified: Secondary | ICD-10-CM | POA: Diagnosis not present

## 2015-09-12 DIAGNOSIS — S60222A Contusion of left hand, initial encounter: Secondary | ICD-10-CM | POA: Diagnosis not present

## 2015-09-12 DIAGNOSIS — Z872 Personal history of diseases of the skin and subcutaneous tissue: Secondary | ICD-10-CM | POA: Diagnosis not present

## 2015-09-12 DIAGNOSIS — M7989 Other specified soft tissue disorders: Secondary | ICD-10-CM | POA: Diagnosis not present

## 2015-09-12 DIAGNOSIS — I10 Essential (primary) hypertension: Secondary | ICD-10-CM | POA: Insufficient documentation

## 2015-09-12 DIAGNOSIS — Z87891 Personal history of nicotine dependence: Secondary | ICD-10-CM | POA: Insufficient documentation

## 2015-09-12 DIAGNOSIS — D649 Anemia, unspecified: Secondary | ICD-10-CM | POA: Diagnosis not present

## 2015-09-12 DIAGNOSIS — Z79899 Other long term (current) drug therapy: Secondary | ICD-10-CM | POA: Insufficient documentation

## 2015-09-12 DIAGNOSIS — I2510 Atherosclerotic heart disease of native coronary artery without angina pectoris: Secondary | ICD-10-CM | POA: Diagnosis not present

## 2015-09-12 DIAGNOSIS — W228XXA Striking against or struck by other objects, initial encounter: Secondary | ICD-10-CM | POA: Insufficient documentation

## 2015-09-12 DIAGNOSIS — Z8781 Personal history of (healed) traumatic fracture: Secondary | ICD-10-CM | POA: Insufficient documentation

## 2015-09-12 DIAGNOSIS — M8580 Other specified disorders of bone density and structure, unspecified site: Secondary | ICD-10-CM | POA: Diagnosis not present

## 2015-09-12 DIAGNOSIS — Z951 Presence of aortocoronary bypass graft: Secondary | ICD-10-CM | POA: Insufficient documentation

## 2015-09-12 DIAGNOSIS — I252 Old myocardial infarction: Secondary | ICD-10-CM | POA: Insufficient documentation

## 2015-09-12 DIAGNOSIS — Z8719 Personal history of other diseases of the digestive system: Secondary | ICD-10-CM | POA: Diagnosis not present

## 2015-09-12 DIAGNOSIS — Z880 Allergy status to penicillin: Secondary | ICD-10-CM | POA: Insufficient documentation

## 2015-09-12 DIAGNOSIS — Z85828 Personal history of other malignant neoplasm of skin: Secondary | ICD-10-CM | POA: Insufficient documentation

## 2015-09-12 DIAGNOSIS — Y998 Other external cause status: Secondary | ICD-10-CM | POA: Insufficient documentation

## 2015-09-12 DIAGNOSIS — Y9289 Other specified places as the place of occurrence of the external cause: Secondary | ICD-10-CM | POA: Diagnosis not present

## 2015-09-12 DIAGNOSIS — Z91040 Latex allergy status: Secondary | ICD-10-CM | POA: Diagnosis not present

## 2015-09-12 DIAGNOSIS — M5137 Other intervertebral disc degeneration, lumbosacral region: Secondary | ICD-10-CM | POA: Diagnosis not present

## 2015-09-12 DIAGNOSIS — T148XXA Other injury of unspecified body region, initial encounter: Secondary | ICD-10-CM

## 2015-09-12 DIAGNOSIS — E7800 Pure hypercholesterolemia, unspecified: Secondary | ICD-10-CM | POA: Insufficient documentation

## 2015-09-12 DIAGNOSIS — Z7982 Long term (current) use of aspirin: Secondary | ICD-10-CM | POA: Diagnosis not present

## 2015-09-12 DIAGNOSIS — I4891 Unspecified atrial fibrillation: Secondary | ICD-10-CM | POA: Diagnosis not present

## 2015-09-12 DIAGNOSIS — S6992XA Unspecified injury of left wrist, hand and finger(s), initial encounter: Secondary | ICD-10-CM | POA: Diagnosis present

## 2015-09-12 DIAGNOSIS — M5030 Other cervical disc degeneration, unspecified cervical region: Secondary | ICD-10-CM | POA: Insufficient documentation

## 2015-09-12 DIAGNOSIS — G40909 Epilepsy, unspecified, not intractable, without status epilepticus: Secondary | ICD-10-CM | POA: Diagnosis not present

## 2015-09-12 DIAGNOSIS — G43909 Migraine, unspecified, not intractable, without status migrainosus: Secondary | ICD-10-CM | POA: Diagnosis not present

## 2015-09-12 LAB — PROTIME-INR
INR: 2.06 — ABNORMAL HIGH (ref 0.00–1.49)
Prothrombin Time: 23 seconds — ABNORMAL HIGH (ref 11.6–15.2)

## 2015-09-12 MED ORDER — ACETAMINOPHEN 325 MG PO TABS
650.0000 mg | ORAL_TABLET | Freq: Once | ORAL | Status: AC
  Administered 2015-09-12: 650 mg via ORAL
  Filled 2015-09-12: qty 2

## 2015-09-12 NOTE — ED Provider Notes (Signed)
  Face-to-face evaluation   History: She sterilely bumped her hand on a car door today, while getting into it.  Physical exam: Left hand with swelling and ecchymosis dorsally over the first web space. Normal range of motion, left hand and wrist.  Medical screening examination/treatment/procedure(s) were conducted as a shared visit with non-physician practitioner(s) and myself.  I personally evaluated the patient during the encounter  Daleen Bo, MD 09/12/15 2257
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
## 2015-09-12 NOTE — ED Notes (Signed)
Pt verbalized understanding of d/c instructions and has no further questions. Pt stable, ambulatory, and NAD.
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
## 2015-09-12 NOTE — Discharge Instructions (Signed)
Hematoma  A hematoma is a collection of blood under the skin, in an organ, in a body space, in a joint space, or in other tissue. The blood can clot to form a lump that you can see and feel. The lump is often firm and may sometimes become sore and tender. Most hematomas get better in a few days to weeks. However, some hematomas may be serious and require medical care. Hematomas can range in size from very small to very large.  CAUSES   A hematoma can be caused by a blunt or penetrating injury. It can also be caused by spontaneous leakage from a blood vessel under the skin. Spontaneous leakage from a blood vessel is more likely to occur in older people, especially those taking blood thinners. Sometimes, a hematoma can develop after certain medical procedures.  SIGNS AND SYMPTOMS   · A firm lump on the body.  · Possible pain and tenderness in the area.  · Bruising. Blue, dark blue, purple-red, or yellowish skin may appear at the site of the hematoma if the hematoma is close to the surface of the skin.  For hematomas in deeper tissues or body spaces, the signs and symptoms may be subtle. For example, an intra-abdominal hematoma may cause abdominal pain, weakness, fainting, and shortness of breath. An intracranial hematoma may cause a headache or symptoms such as weakness, trouble speaking, or a change in consciousness.  DIAGNOSIS   A hematoma can usually be diagnosed based on your medical history and a physical exam. Imaging tests may be needed if your health care provider suspects a hematoma in deeper tissues or body spaces, such as the abdomen, head, or chest. These tests may include ultrasonography or a CT scan.   TREATMENT   Hematomas usually go away on their own over time. Rarely does the blood need to be drained out of the body. Large hematomas or those that may affect vital organs will sometimes need surgical drainage or monitoring.  HOME CARE INSTRUCTIONS   · Apply ice to the injured area:      Put ice in a  plastic bag.      Place a towel between your skin and the bag.      Leave the ice on for 20 minutes, 2-3 times a day for the first 1 to 2 days.    · After the first 2 days, switch to using warm compresses on the hematoma.    · Elevate the injured area to help decrease pain and swelling. Wrapping the area with an elastic bandage may also be helpful. Compression helps to reduce swelling and promotes shrinking of the hematoma. Make sure the bandage is not wrapped too tight.    · If your hematoma is on a lower extremity and is painful, crutches may be helpful for a couple days.    · Only take over-the-counter or prescription medicines as directed by your health care provider.  SEEK IMMEDIATE MEDICAL CARE IF:   · You have increasing pain, or your pain is not controlled with medicine.    · You have a fever.    · You have worsening swelling or discoloration.    · Your skin over the hematoma breaks or starts bleeding.    · Your hematoma is in your chest or abdomen and you have weakness, shortness of breath, or a change in consciousness.  · Your hematoma is on your scalp (caused by a fall or injury) and you have a worsening headache or a change in alertness or consciousness.  MAKE SURE YOU:   ·   Understand these instructions.  · Will watch your condition.  · Will get help right away if you are not doing well or get worse.     This information is not intended to replace advice given to you by your health care provider. Make sure you discuss any questions you have with your health care provider.     Document Released: 06/13/2004 Document Revised: 07/02/2013 Document Reviewed: 04/09/2013  Elsevier Interactive Patient Education ©2016 [employer]

## 2015-09-14 DIAGNOSIS — S60229A Contusion of unspecified hand, initial encounter: Secondary | ICD-10-CM | POA: Diagnosis not present

## 2015-09-15 ENCOUNTER — Ambulatory Visit: Payer: Medicare Other | Admitting: Pharmacist Clinician (PhC)/ Clinical Pharmacy Specialist

## 2015-09-16 ENCOUNTER — Ambulatory Visit (INDEPENDENT_AMBULATORY_CARE_PROVIDER_SITE_OTHER): Payer: Medicare Other | Admitting: Pharmacist Clinician (PhC)/ Clinical Pharmacy Specialist

## 2015-09-16 ENCOUNTER — Telehealth: Payer: Self-pay | Admitting: Internal Medicine

## 2015-09-16 DIAGNOSIS — I480 Paroxysmal atrial fibrillation: Secondary | ICD-10-CM | POA: Diagnosis not present

## 2015-09-16 DIAGNOSIS — I4891 Unspecified atrial fibrillation: Secondary | ICD-10-CM | POA: Diagnosis not present

## 2015-09-16 DIAGNOSIS — Z7901 Long term (current) use of anticoagulants: Secondary | ICD-10-CM | POA: Diagnosis not present

## 2015-09-16 LAB — POCT INR: INR: 3.4

## 2015-09-16 NOTE — Telephone Encounter (Signed)
Made in error

## 2015-09-24 DIAGNOSIS — I10 Essential (primary) hypertension: Secondary | ICD-10-CM | POA: Diagnosis not present

## 2015-09-24 DIAGNOSIS — R1032 Left lower quadrant pain: Secondary | ICD-10-CM | POA: Diagnosis not present

## 2015-09-24 DIAGNOSIS — R05 Cough: Secondary | ICD-10-CM | POA: Diagnosis not present

## 2015-09-29 DIAGNOSIS — Z9049 Acquired absence of other specified parts of digestive tract: Secondary | ICD-10-CM | POA: Diagnosis not present

## 2015-09-29 DIAGNOSIS — R109 Unspecified abdominal pain: Secondary | ICD-10-CM | POA: Diagnosis not present

## 2015-10-01 ENCOUNTER — Ambulatory Visit (INDEPENDENT_AMBULATORY_CARE_PROVIDER_SITE_OTHER): Payer: Medicare Other | Admitting: Pharmacist Clinician (PhC)/ Clinical Pharmacy Specialist

## 2015-10-01 DIAGNOSIS — I4891 Unspecified atrial fibrillation: Secondary | ICD-10-CM

## 2015-10-01 DIAGNOSIS — Z7901 Long term (current) use of anticoagulants: Secondary | ICD-10-CM | POA: Diagnosis not present

## 2015-10-01 DIAGNOSIS — I480 Paroxysmal atrial fibrillation: Secondary | ICD-10-CM | POA: Diagnosis not present

## 2015-10-01 LAB — POCT INR: INR: 2.7

## 2015-10-05 ENCOUNTER — Telehealth: Payer: Self-pay | Admitting: Pharmacist Clinician (PhC)/ Clinical Pharmacy Specialist

## 2015-10-05 DIAGNOSIS — J208 Acute bronchitis due to other specified organisms: Secondary | ICD-10-CM | POA: Diagnosis not present

## 2015-10-05 DIAGNOSIS — B9689 Other specified bacterial agents as the cause of diseases classified elsewhere: Secondary | ICD-10-CM | POA: Diagnosis not present

## 2015-10-05 DIAGNOSIS — R05 Cough: Secondary | ICD-10-CM | POA: Diagnosis not present

## 2015-10-05 NOTE — Telephone Encounter (Signed)
Pt called, she has bronchitis, was given rx for azithromycin and was told to check with [place] on when to have next INR  Returned call, LMOM that azithromycin was fine with warfarin.  No need to come in sooner

## 2015-10-10 ENCOUNTER — Other Ambulatory Visit: Payer: Self-pay | Admitting: Cardiovascular Disease

## 2015-10-11 NOTE — Telephone Encounter (Signed)
Rx(s) sent to pharmacy electronically.  

## 2015-10-25 ENCOUNTER — Ambulatory Visit (INDEPENDENT_AMBULATORY_CARE_PROVIDER_SITE_OTHER): Payer: Medicare Other | Admitting: Pharmacist Clinician (PhC)/ Clinical Pharmacy Specialist

## 2015-10-25 DIAGNOSIS — I480 Paroxysmal atrial fibrillation: Secondary | ICD-10-CM

## 2015-10-25 DIAGNOSIS — Z7901 Long term (current) use of anticoagulants: Secondary | ICD-10-CM | POA: Diagnosis not present

## 2015-10-25 DIAGNOSIS — I4891 Unspecified atrial fibrillation: Secondary | ICD-10-CM | POA: Diagnosis not present

## 2015-10-25 LAB — POCT INR: INR: 4.9

## 2015-10-27 ENCOUNTER — Ambulatory Visit: Payer: Medicare Other | Admitting: Pharmacist Clinician (PhC)/ Clinical Pharmacy Specialist

## 2015-10-27 DIAGNOSIS — Z1231 Encounter for screening mammogram for malignant neoplasm of breast: Secondary | ICD-10-CM | POA: Diagnosis not present

## 2015-10-29 DIAGNOSIS — N898 Other specified noninflammatory disorders of vagina: Secondary | ICD-10-CM | POA: Diagnosis not present

## 2015-10-29 DIAGNOSIS — Z6826 Body mass index (BMI) 26.0-26.9, adult: Secondary | ICD-10-CM | POA: Diagnosis not present

## 2015-10-29 DIAGNOSIS — Z124 Encounter for screening for malignant neoplasm of cervix: Secondary | ICD-10-CM | POA: Diagnosis not present

## 2015-10-29 DIAGNOSIS — N882 Stricture and stenosis of cervix uteri: Secondary | ICD-10-CM | POA: Diagnosis not present

## 2015-10-29 DIAGNOSIS — N950 Postmenopausal bleeding: Secondary | ICD-10-CM | POA: Diagnosis not present

## 2015-11-03 ENCOUNTER — Ambulatory Visit (INDEPENDENT_AMBULATORY_CARE_PROVIDER_SITE_OTHER): Payer: Medicare Other | Admitting: Pharmacist Clinician (PhC)/ Clinical Pharmacy Specialist

## 2015-11-03 DIAGNOSIS — I4891 Unspecified atrial fibrillation: Secondary | ICD-10-CM

## 2015-11-03 DIAGNOSIS — I480 Paroxysmal atrial fibrillation: Secondary | ICD-10-CM | POA: Diagnosis not present

## 2015-11-03 DIAGNOSIS — Z7901 Long term (current) use of anticoagulants: Secondary | ICD-10-CM

## 2015-11-03 LAB — POCT INR: INR: 3

## 2015-11-10 DIAGNOSIS — D251 Intramural leiomyoma of uterus: Secondary | ICD-10-CM | POA: Diagnosis not present

## 2015-11-10 DIAGNOSIS — D259 Leiomyoma of uterus, unspecified: Secondary | ICD-10-CM | POA: Diagnosis not present

## 2015-11-10 DIAGNOSIS — N898 Other specified noninflammatory disorders of vagina: Secondary | ICD-10-CM | POA: Diagnosis not present

## 2015-11-10 DIAGNOSIS — N950 Postmenopausal bleeding: Secondary | ICD-10-CM | POA: Diagnosis not present

## 2015-11-11 DIAGNOSIS — Z01818 Encounter for other preprocedural examination: Secondary | ICD-10-CM | POA: Diagnosis not present

## 2015-11-11 DIAGNOSIS — I10 Essential (primary) hypertension: Secondary | ICD-10-CM | POA: Diagnosis not present

## 2015-11-11 DIAGNOSIS — I25810 Atherosclerosis of coronary artery bypass graft(s) without angina pectoris: Secondary | ICD-10-CM | POA: Diagnosis not present

## 2015-11-11 DIAGNOSIS — E785 Hyperlipidemia, unspecified: Secondary | ICD-10-CM | POA: Diagnosis not present

## 2015-11-11 DIAGNOSIS — N950 Postmenopausal bleeding: Secondary | ICD-10-CM | POA: Diagnosis not present

## 2015-11-11 DIAGNOSIS — I480 Paroxysmal atrial fibrillation: Secondary | ICD-10-CM | POA: Diagnosis not present

## 2015-11-17 ENCOUNTER — Telehealth: Payer: Self-pay | Admitting: Pharmacist Clinician (PhC)/ Clinical Pharmacy Specialist

## 2015-11-17 ENCOUNTER — Ambulatory Visit (INDEPENDENT_AMBULATORY_CARE_PROVIDER_SITE_OTHER): Payer: Medicare Other | Admitting: Pharmacist Clinician (PhC)/ Clinical Pharmacy Specialist

## 2015-11-17 DIAGNOSIS — I4891 Unspecified atrial fibrillation: Secondary | ICD-10-CM | POA: Diagnosis not present

## 2015-11-17 DIAGNOSIS — Z7901 Long term (current) use of anticoagulants: Secondary | ICD-10-CM

## 2015-11-17 DIAGNOSIS — I480 Paroxysmal atrial fibrillation: Secondary | ICD-10-CM | POA: Diagnosis not present

## 2015-11-17 LAB — POCT INR: INR: 2.9

## 2015-11-17 MED ORDER — METOPROLOL SUCCINATE ER 25 MG PO TB24: 25.0000 mg | ORAL_TABLET | Freq: Two times a day (BID) | ORAL | Status: DC

## 2015-11-17 NOTE — Telephone Encounter (Signed)
Patient in office for INR today, states that she recently increased her metoprolol from 12.5 mg twice daily back to 25 mg twice daily.  She reports that she was having more episodes of atrial fibrillation.  States since increasing back to 25 mg bid has had only few AF episodes.   Will adjust prescription accordingly.

## 2015-11-22 ENCOUNTER — Telehealth: Payer: Self-pay | Admitting: Pharmacist Clinician (PhC)/ Clinical Pharmacy Specialist

## 2015-11-22 MED ORDER — METOPROLOL SUCCINATE ER 25 MG PO TB24: 25.0000 mg | ORAL_TABLET | Freq: Two times a day (BID) | ORAL | Status: DC

## 2015-11-22 NOTE — Telephone Encounter (Signed)
Patient states she called last week for her Metroprolol Scc. '25mg'$  to be refilled.  This was sent to [facility] in [place] and she has changed pharmacies to [facility] on [address] in [place].  Patient also wants her pharmacy changed to [facility] in [place].

## 2015-11-24 ENCOUNTER — Other Ambulatory Visit: Payer: Self-pay | Admitting: *Deleted

## 2015-11-24 DIAGNOSIS — Z951 Presence of aortocoronary bypass graft: Secondary | ICD-10-CM

## 2015-11-24 DIAGNOSIS — Z01810 Encounter for preprocedural cardiovascular examination: Secondary | ICD-10-CM

## 2015-11-24 DIAGNOSIS — I480 Paroxysmal atrial fibrillation: Secondary | ICD-10-CM

## 2015-11-25 ENCOUNTER — Other Ambulatory Visit: Payer: Self-pay | Admitting: Cardiovascular Disease

## 2015-11-25 MED ORDER — SIMVASTATIN 20 MG PO TABS: 20.0000 mg | ORAL_TABLET | Freq: Every day | ORAL | Status: DC

## 2015-11-25 NOTE — Telephone Encounter (Signed)
°*  STAT* If patient is at the pharmacy, call can be transferred to refill team.   1. Which medications need to be refilled? (please list name of each medication and dose if known) Simvastatin-changed pharmacy  2. Which pharmacy/location (including street and city if local pharmacy) is medication to be sent to?[facility]-[phone]  3. Do they need a 30 day or 90 day supply?30 and refills

## 2015-11-25 NOTE — Telephone Encounter (Signed)
Rx(s) sent to pharmacy electronically. Simvastatin sent to [facility], #30 with 9 refills

## 2015-11-29 ENCOUNTER — Ambulatory Visit: Payer: Medicare Other | Admitting: Cardiology

## 2015-11-30 ENCOUNTER — Telehealth: Payer: Self-pay | Admitting: Cardiovascular Disease

## 2015-11-30 NOTE — Telephone Encounter (Signed)
Calling because she has changed her prescription plan and is wanting the generic form of Zeitia ( Ezetimibe) and is needing a letter from Dr. Claiborne Billings fax to [phone] . [employer] Medicare RX w/ [place] . Also wants to know if Simvastatin is the same as [place]  Please call if you have any questions ..   thanks

## 2015-11-30 NOTE — Telephone Encounter (Signed)
Calling because she has changed her prescription plan and is wanting the generic form of Zeitia ( Ezetimibe) and is needing a letter from Dr. Claiborne Billings fax to [phone] . [employer] Medicare [place] w/ [place]

## 2015-12-01 ENCOUNTER — Ambulatory Visit (HOSPITAL_COMMUNITY): Payer: Medicare Other | Attending: Cardiovascular Disease

## 2015-12-01 ENCOUNTER — Other Ambulatory Visit: Payer: Self-pay

## 2015-12-01 DIAGNOSIS — Z951 Presence of aortocoronary bypass graft: Secondary | ICD-10-CM | POA: Insufficient documentation

## 2015-12-01 DIAGNOSIS — I358 Other nonrheumatic aortic valve disorders: Secondary | ICD-10-CM | POA: Diagnosis not present

## 2015-12-01 DIAGNOSIS — I517 Cardiomegaly: Secondary | ICD-10-CM | POA: Insufficient documentation

## 2015-12-01 DIAGNOSIS — Z01810 Encounter for preprocedural cardiovascular examination: Secondary | ICD-10-CM | POA: Diagnosis not present

## 2015-12-01 DIAGNOSIS — I10 Essential (primary) hypertension: Secondary | ICD-10-CM | POA: Diagnosis not present

## 2015-12-01 DIAGNOSIS — I253 Aneurysm of heart: Secondary | ICD-10-CM | POA: Insufficient documentation

## 2015-12-01 DIAGNOSIS — I340 Nonrheumatic mitral (valve) insufficiency: Secondary | ICD-10-CM | POA: Insufficient documentation

## 2015-12-01 DIAGNOSIS — I059 Rheumatic mitral valve disease, unspecified: Secondary | ICD-10-CM | POA: Diagnosis not present

## 2015-12-01 DIAGNOSIS — I480 Paroxysmal atrial fibrillation: Secondary | ICD-10-CM | POA: Diagnosis not present

## 2015-12-01 DIAGNOSIS — I4891 Unspecified atrial fibrillation: Secondary | ICD-10-CM | POA: Diagnosis present

## 2015-12-06 ENCOUNTER — Encounter: Payer: Medicare Other | Admitting: Pharmacist Clinician (PhC)/ Clinical Pharmacy Specialist

## 2015-12-06 ENCOUNTER — Ambulatory Visit (INDEPENDENT_AMBULATORY_CARE_PROVIDER_SITE_OTHER): Payer: Medicare Other | Admitting: Cardiovascular Disease

## 2015-12-06 ENCOUNTER — Encounter: Payer: Self-pay | Admitting: Cardiovascular Disease

## 2015-12-06 VITALS — BP 134/72 | HR 55 | Ht 63.0 in | Wt 153.4 lb

## 2015-12-06 DIAGNOSIS — Z7901 Long term (current) use of anticoagulants: Secondary | ICD-10-CM

## 2015-12-06 DIAGNOSIS — I480 Paroxysmal atrial fibrillation: Secondary | ICD-10-CM

## 2015-12-06 DIAGNOSIS — E785 Hyperlipidemia, unspecified: Secondary | ICD-10-CM

## 2015-12-06 DIAGNOSIS — R5383 Other fatigue: Secondary | ICD-10-CM | POA: Diagnosis not present

## 2015-12-06 DIAGNOSIS — I2583 Coronary atherosclerosis due to lipid rich plaque: Secondary | ICD-10-CM

## 2015-12-06 DIAGNOSIS — R0683 Snoring: Secondary | ICD-10-CM | POA: Diagnosis not present

## 2015-12-06 DIAGNOSIS — G478 Other sleep disorders: Secondary | ICD-10-CM | POA: Diagnosis not present

## 2015-12-06 DIAGNOSIS — G4733 Obstructive sleep apnea (adult) (pediatric): Secondary | ICD-10-CM

## 2015-12-06 DIAGNOSIS — I10 Essential (primary) hypertension: Secondary | ICD-10-CM

## 2015-12-06 DIAGNOSIS — I2510 Atherosclerotic heart disease of native coronary artery without angina pectoris: Secondary | ICD-10-CM

## 2015-12-06 NOTE — Patient Instructions (Signed)
Your physician has recommended that you have a sleep study. This test records several body functions during sleep, including: brain activity, eye movement, oxygen and carbon dioxide blood levels, heart rate and rhythm, breathing rate and rhythm, the flow of air through your mouth and nose, snoring, body muscle movements, and chest and belly movement. This will be done at [facility].  Your physician recommends that you schedule a follow-up appointment in: 4 months with dr Claiborne Billings.

## 2015-12-07 DIAGNOSIS — G4733 Obstructive sleep apnea (adult) (pediatric): Secondary | ICD-10-CM | POA: Insufficient documentation
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
## 2015-12-15 HISTORY — PX: ENTROPIAN REPAIR: SHX1512

## 2015-12-15 NOTE — Telephone Encounter (Signed)
Ok to give letter for generic

## 2015-12-16 ENCOUNTER — Encounter (HOSPITAL_COMMUNITY): Payer: Self-pay

## 2015-12-16 NOTE — Telephone Encounter (Signed)
Routed to [employer]

## 2015-12-17 ENCOUNTER — Encounter: Payer: Medicare Other | Admitting: Pharmacist Clinician (PhC)/ Clinical Pharmacy Specialist

## 2015-12-20 ENCOUNTER — Ambulatory Visit (INDEPENDENT_AMBULATORY_CARE_PROVIDER_SITE_OTHER): Payer: Medicare Other | Admitting: Pharmacist Clinician (PhC)/ Clinical Pharmacy Specialist

## 2015-12-20 DIAGNOSIS — I4891 Unspecified atrial fibrillation: Secondary | ICD-10-CM

## 2015-12-20 DIAGNOSIS — Z7901 Long term (current) use of anticoagulants: Secondary | ICD-10-CM | POA: Diagnosis not present

## 2015-12-20 DIAGNOSIS — I480 Paroxysmal atrial fibrillation: Secondary | ICD-10-CM

## 2015-12-20 LAB — POCT INR: INR: 4.2

## 2015-12-29 ENCOUNTER — Encounter: Payer: Medicare Other | Admitting: Pharmacist Clinician (PhC)/ Clinical Pharmacy Specialist

## 2016-01-03 ENCOUNTER — Ambulatory Visit (INDEPENDENT_AMBULATORY_CARE_PROVIDER_SITE_OTHER): Payer: Medicare Other | Admitting: Pharmacist Clinician (PhC)/ Clinical Pharmacy Specialist

## 2016-01-03 DIAGNOSIS — Z7901 Long term (current) use of anticoagulants: Secondary | ICD-10-CM

## 2016-01-03 DIAGNOSIS — I4891 Unspecified atrial fibrillation: Secondary | ICD-10-CM

## 2016-01-03 DIAGNOSIS — I480 Paroxysmal atrial fibrillation: Secondary | ICD-10-CM

## 2016-01-03 LAB — POCT INR: INR: 3

## 2016-01-12 DIAGNOSIS — M810 Age-related osteoporosis without current pathological fracture: Secondary | ICD-10-CM | POA: Diagnosis not present

## 2016-01-12 DIAGNOSIS — Z951 Presence of aortocoronary bypass graft: Secondary | ICD-10-CM | POA: Diagnosis not present

## 2016-01-12 DIAGNOSIS — I252 Old myocardial infarction: Secondary | ICD-10-CM | POA: Diagnosis not present

## 2016-01-12 DIAGNOSIS — N882 Stricture and stenosis of cervix uteri: Secondary | ICD-10-CM | POA: Diagnosis not present

## 2016-01-12 DIAGNOSIS — G43909 Migraine, unspecified, not intractable, without status migrainosus: Secondary | ICD-10-CM | POA: Diagnosis not present

## 2016-01-12 DIAGNOSIS — Z780 Asymptomatic menopausal state: Secondary | ICD-10-CM | POA: Diagnosis not present

## 2016-01-12 DIAGNOSIS — Z882 Allergy status to sulfonamides status: Secondary | ICD-10-CM | POA: Diagnosis not present

## 2016-01-12 DIAGNOSIS — I10 Essential (primary) hypertension: Secondary | ICD-10-CM | POA: Diagnosis not present

## 2016-01-12 DIAGNOSIS — N952 Postmenopausal atrophic vaginitis: Secondary | ICD-10-CM | POA: Diagnosis not present

## 2016-01-12 DIAGNOSIS — I2510 Atherosclerotic heart disease of native coronary artery without angina pectoris: Secondary | ICD-10-CM | POA: Diagnosis not present

## 2016-01-12 DIAGNOSIS — E785 Hyperlipidemia, unspecified: Secondary | ICD-10-CM | POA: Diagnosis not present

## 2016-01-12 DIAGNOSIS — Z87891 Personal history of nicotine dependence: Secondary | ICD-10-CM | POA: Diagnosis not present

## 2016-01-12 DIAGNOSIS — N898 Other specified noninflammatory disorders of vagina: Secondary | ICD-10-CM | POA: Diagnosis not present

## 2016-01-12 DIAGNOSIS — N858 Other specified noninflammatory disorders of uterus: Secondary | ICD-10-CM | POA: Diagnosis not present

## 2016-01-12 DIAGNOSIS — N950 Postmenopausal bleeding: Secondary | ICD-10-CM | POA: Diagnosis not present

## 2016-01-20 NOTE — Telephone Encounter (Signed)
Forwarded to [employer]

## 2016-01-24 ENCOUNTER — Other Ambulatory Visit: Payer: Self-pay

## 2016-01-24 MED ORDER — EZETIMIBE 10 MG PO TABS: 10.0000 mg | ORAL_TABLET | Freq: Every evening | ORAL | Status: DC

## 2016-01-24 NOTE — Telephone Encounter (Signed)
A letter is NOT required to change a drug from brand to generic. I have sent a refill to her pharmacy, stating generic is fine to use.

## 2016-01-25 ENCOUNTER — Ambulatory Visit (INDEPENDENT_AMBULATORY_CARE_PROVIDER_SITE_OTHER): Payer: Medicare Other | Admitting: Pharmacist Clinician (PhC)/ Clinical Pharmacy Specialist

## 2016-01-25 DIAGNOSIS — I4891 Unspecified atrial fibrillation: Secondary | ICD-10-CM

## 2016-01-25 DIAGNOSIS — Z7901 Long term (current) use of anticoagulants: Secondary | ICD-10-CM

## 2016-01-25 DIAGNOSIS — I480 Paroxysmal atrial fibrillation: Secondary | ICD-10-CM | POA: Diagnosis not present

## 2016-01-25 LAB — POCT INR: INR: 2.9

## 2016-02-02 DIAGNOSIS — Z09 Encounter for follow-up examination after completed treatment for conditions other than malignant neoplasm: Secondary | ICD-10-CM | POA: Diagnosis not present

## 2016-02-02 DIAGNOSIS — N988 Other complications associated with artificial fertilization: Secondary | ICD-10-CM | POA: Diagnosis not present

## 2016-02-06 ENCOUNTER — Other Ambulatory Visit: Payer: Self-pay | Admitting: Cardiovascular Disease

## 2016-02-28 ENCOUNTER — Ambulatory Visit (INDEPENDENT_AMBULATORY_CARE_PROVIDER_SITE_OTHER): Payer: Medicare Other | Admitting: Pharmacist Clinician (PhC)/ Clinical Pharmacy Specialist

## 2016-02-28 DIAGNOSIS — Z7901 Long term (current) use of anticoagulants: Secondary | ICD-10-CM | POA: Diagnosis not present

## 2016-02-28 DIAGNOSIS — I480 Paroxysmal atrial fibrillation: Secondary | ICD-10-CM

## 2016-02-28 DIAGNOSIS — I4891 Unspecified atrial fibrillation: Secondary | ICD-10-CM | POA: Diagnosis not present

## 2016-02-28 LAB — POCT INR: INR: 4.4

## 2016-03-02 DIAGNOSIS — Z9049 Acquired absence of other specified parts of digestive tract: Secondary | ICD-10-CM | POA: Diagnosis not present

## 2016-03-02 DIAGNOSIS — Z8719 Personal history of other diseases of the digestive system: Secondary | ICD-10-CM | POA: Diagnosis not present

## 2016-03-02 DIAGNOSIS — K5900 Constipation, unspecified: Secondary | ICD-10-CM | POA: Diagnosis not present

## 2016-03-02 DIAGNOSIS — Z9889 Other specified postprocedural states: Secondary | ICD-10-CM | POA: Diagnosis not present

## 2016-03-10 ENCOUNTER — Telehealth: Payer: Self-pay | Admitting: Cardiovascular Disease

## 2016-03-10 NOTE — Telephone Encounter (Signed)
Left msg to call.

## 2016-03-10 NOTE — Telephone Encounter (Signed)
New message  Pt c/o Shortness Of Breath: STAT if SOB developed within the last 24 hours or pt is noticeably SOB on the phone  1. Are you currently SOB (can you hear that pt is SOB on the phone)? NO  2. How long have you been experiencing SOB? 6 months ( Seems to get worse with time)  3. Are you SOB when sitting or when up moving around? Moving around,  4.  Are you currently experiencing any other symptoms? No  Appt on 03/22/2016 with Tenny Craw at 10:30a

## 2016-03-13 ENCOUNTER — Ambulatory Visit (INDEPENDENT_AMBULATORY_CARE_PROVIDER_SITE_OTHER): Payer: Medicare Other | Admitting: Pharmacist

## 2016-03-13 DIAGNOSIS — I4891 Unspecified atrial fibrillation: Secondary | ICD-10-CM | POA: Diagnosis not present

## 2016-03-13 DIAGNOSIS — I480 Paroxysmal atrial fibrillation: Secondary | ICD-10-CM | POA: Diagnosis not present

## 2016-03-13 DIAGNOSIS — Z7901 Long term (current) use of anticoagulants: Secondary | ICD-10-CM

## 2016-03-13 LAB — POCT INR: INR: 3.7

## 2016-03-13 NOTE — Telephone Encounter (Signed)
2nd msg to call.

## 2016-03-23 ENCOUNTER — Ambulatory Visit (INDEPENDENT_AMBULATORY_CARE_PROVIDER_SITE_OTHER): Payer: Medicare Other | Admitting: Physician Assistant

## 2016-03-23 ENCOUNTER — Encounter: Payer: Self-pay | Admitting: Physician Assistant

## 2016-03-23 ENCOUNTER — Ambulatory Visit (INDEPENDENT_AMBULATORY_CARE_PROVIDER_SITE_OTHER): Payer: Medicare Other | Admitting: Pharmacist Clinician (PhC)/ Clinical Pharmacy Specialist

## 2016-03-23 VITALS — BP 117/75 | HR 61 | Ht 63.0 in | Wt 154.8 lb

## 2016-03-23 DIAGNOSIS — Z7901 Long term (current) use of anticoagulants: Secondary | ICD-10-CM

## 2016-03-23 DIAGNOSIS — I480 Paroxysmal atrial fibrillation: Secondary | ICD-10-CM | POA: Diagnosis not present

## 2016-03-23 DIAGNOSIS — I4891 Unspecified atrial fibrillation: Secondary | ICD-10-CM | POA: Diagnosis not present

## 2016-03-23 DIAGNOSIS — I2510 Atherosclerotic heart disease of native coronary artery without angina pectoris: Secondary | ICD-10-CM | POA: Diagnosis not present

## 2016-03-23 DIAGNOSIS — I10 Essential (primary) hypertension: Secondary | ICD-10-CM

## 2016-03-23 DIAGNOSIS — I2583 Coronary atherosclerosis due to lipid rich plaque: Secondary | ICD-10-CM

## 2016-03-23 DIAGNOSIS — E785 Hyperlipidemia, unspecified: Secondary | ICD-10-CM

## 2016-03-23 LAB — POCT INR: INR: 2.5

## 2016-03-23 MED ORDER — METOPROLOL TARTRATE 25 MG PO TABS: 25.0000 mg | ORAL_TABLET | Freq: Every day | ORAL | Status: DC | PRN
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
## 2016-03-23 NOTE — Patient Instructions (Addendum)
Medication Instructions:   TAKE ONE TABLET OF LOPRESSOR 25 MG DAILY AS NEEDED FOR AFIB EPISODE  If you need a refill on your cardiac medications before your next appointment, please call your pharmacy.  Labwork: NONE ORDER TODAY   Testing/Procedures: NEED TO BE SCHEDULED FOR SLEEP STUDY   Follow-Up. 3 MONTHS WITH DR Claiborne Billings    Any Other Special Instructions Will Be Listed Below (If Applicable).

## 2016-03-27 ENCOUNTER — Encounter: Payer: Medicare Other | Admitting: Pharmacist Clinician (PhC)/ Clinical Pharmacy Specialist

## 2016-04-19 ENCOUNTER — Ambulatory Visit (INDEPENDENT_AMBULATORY_CARE_PROVIDER_SITE_OTHER): Payer: Medicare Other | Admitting: Pharmacist Clinician (PhC)/ Clinical Pharmacy Specialist

## 2016-04-19 DIAGNOSIS — I480 Paroxysmal atrial fibrillation: Secondary | ICD-10-CM | POA: Diagnosis not present

## 2016-04-19 DIAGNOSIS — I4891 Unspecified atrial fibrillation: Secondary | ICD-10-CM | POA: Diagnosis not present

## 2016-04-19 DIAGNOSIS — Z7901 Long term (current) use of anticoagulants: Secondary | ICD-10-CM

## 2016-04-19 LAB — POCT INR: INR: 4

## 2016-04-24 ENCOUNTER — Telehealth: Payer: Self-pay | Admitting: Cardiovascular Disease

## 2016-04-24 MED ORDER — LISINOPRIL 10 MG PO TABS: 10.0000 mg | ORAL_TABLET | Freq: Two times a day (BID) | ORAL | Status: DC

## 2016-04-24 NOTE — Telephone Encounter (Signed)
New message      *STAT* If patient is at the pharmacy, call can be transferred to refill team.   1. Which medications need to be refilled? (please list name of each medication and dose if known) lisinopril 20 mg 10 in the am and 10 at pm, the pt wants 60 mg tablet called in  2. Which pharmacy/location (including street and city if local pharmacy) is medication to be sent to?[facility] on [address] in [place]  3. Do they need a 30 day or 90 day supply? 30 day supply   The pt is almost out.

## 2016-04-24 NOTE — Telephone Encounter (Signed)
Rx(s) sent to pharmacy electronically.  

## 2016-05-02 ENCOUNTER — Ambulatory Visit (INDEPENDENT_AMBULATORY_CARE_PROVIDER_SITE_OTHER): Payer: Medicare Other | Admitting: Pharmacist

## 2016-05-02 DIAGNOSIS — I4891 Unspecified atrial fibrillation: Secondary | ICD-10-CM | POA: Diagnosis not present

## 2016-05-02 DIAGNOSIS — I480 Paroxysmal atrial fibrillation: Secondary | ICD-10-CM | POA: Diagnosis not present

## 2016-05-02 DIAGNOSIS — Z7901 Long term (current) use of anticoagulants: Secondary | ICD-10-CM

## 2016-05-02 LAB — POCT INR: INR: 2.2

## 2016-05-08 DIAGNOSIS — M3501 Sicca syndrome with keratoconjunctivitis: Secondary | ICD-10-CM | POA: Diagnosis not present

## 2016-05-10 ENCOUNTER — Telehealth: Payer: Self-pay | Admitting: Cardiovascular Disease

## 2016-05-10 ENCOUNTER — Ambulatory Visit (HOSPITAL_BASED_OUTPATIENT_CLINIC_OR_DEPARTMENT_OTHER): Payer: Medicare Other | Attending: Cardiovascular Disease | Admitting: Cardiovascular Disease

## 2016-05-10 DIAGNOSIS — Z7982 Long term (current) use of aspirin: Secondary | ICD-10-CM | POA: Diagnosis not present

## 2016-05-10 DIAGNOSIS — G4761 Periodic limb movement disorder: Secondary | ICD-10-CM | POA: Diagnosis not present

## 2016-05-10 DIAGNOSIS — G478 Other sleep disorders: Secondary | ICD-10-CM | POA: Diagnosis not present

## 2016-05-10 DIAGNOSIS — Z7901 Long term (current) use of anticoagulants: Secondary | ICD-10-CM | POA: Diagnosis not present

## 2016-05-10 DIAGNOSIS — R0683 Snoring: Secondary | ICD-10-CM | POA: Diagnosis not present

## 2016-05-10 DIAGNOSIS — Z79899 Other long term (current) drug therapy: Secondary | ICD-10-CM | POA: Insufficient documentation

## 2016-05-10 DIAGNOSIS — I480 Paroxysmal atrial fibrillation: Secondary | ICD-10-CM

## 2016-05-10 DIAGNOSIS — I493 Ventricular premature depolarization: Secondary | ICD-10-CM | POA: Diagnosis not present

## 2016-05-10 DIAGNOSIS — R5383 Other fatigue: Secondary | ICD-10-CM | POA: Diagnosis not present

## 2016-05-10 NOTE — Telephone Encounter (Signed)
Inflammation in eye, went to ophthalmologist and received prescription for steroid eye drops.  Assured patient that these would be fine with her warfarin .  Patient voiced understanding.

## 2016-05-10 NOTE — Telephone Encounter (Signed)
New message      Pt c/o medication issue:  1. Name of Medication: Durezol  2. How are you currently taking this medication (dosage and times per day)? Eye drop steroid 0.05% twice daily for 2 weeks  3. Are you having a reaction (difficulty breathing--STAT)? no  4. What is your medication issue? The pt is making sure the eye drop is not reacting to the coumadin

## 2016-05-16 NOTE — Procedures (Signed)
Patient Name: [name], [name] Date: 05/10/2016 Gender: Female D.O.B: [birth year] Age (years): 28 Referring Provider: Shelva Majestic MD, ABSM Height (inches): 63 Interpreting Physician: Shelva Majestic MD, ABSM Weight (lbs): 150 RPSGT: Earney [place] BMI: 27 MRN: [id number] Neck Size: 13.50  CLINICAL INFORMATION Sleep Study Type: NPSG   Indication for sleep study: Fatigue, Snoring, PAF.   Epworth Sleepiness Score: 5   SLEEP STUDY TECHNIQUE As per the AASM Manual for the Scoring of Sleep and Associated Events v2.3 (April 2016) with a hypopnea requiring 4% desaturations. The channels recorded and monitored were frontal, central and occipital EEG, electrooculogram (EOG), submentalis EMG (chin), nasal and oral airflow, thoracic and abdominal wall motion, anterior tibialis EMG, snore microphone, electrocardiogram, and pulse oximetry.  MEDICATIONS  warfarin (COUMADIN) 2.5 MG tablet      warfarin (COUMADIN) 2.5 MG tablet      warfarin (COUMADIN) 2.5 MG tablet      Note: 2.5 mg all days, wed 1.5 tablets 3.75 (Written 03/04/2015 1410)   valACYclovir (VALTREX) 500 MG tablet 500 mg, Daily     Note: Received from: External Pharmacy Received Sig: (Written 09/16/2015 1221)   simvastatin (ZOCOR) 20 MG tablet 20 mg, Daily     pyridOXINE (VITAMIN B-6) 100 MG tablet 100 mg, Daily     Polyethyl Glycol-Propyl Glycol 0.4-0.3 % SOLN 1 drop, Daily     omega-3 acid ethyl esters (LOVAZA) 1 G capsule      nitroGLYCERIN (NITROSTAT) 0.4 MG SL tablet 0.4 mg, Every 5 min x3 PRN     metoprolol tartrate (LOPRESSOR) 25 MG tablet 25 mg, Daily PRN     metoprolol succinate (TOPROL-XL) 25 MG 24 hr tablet 25 mg, 2 times daily     lisinopril (PRINIVIL,ZESTRIL) 10 MG tablet 10 mg, 2 times daily     isosorbide mononitrate (IMDUR) 30 MG 24 hr tablet 30 mg, Daily     hydrocortisone valerate cream (WESTCORT) 0.2 % 1 application, 3 times weekly     ezetimibe (ZETIA) 10 MG tablet 10 mg, Every evening     docusate sodium (COLACE) 100 MG capsule 100 mg, Daily PRN     Note: Diverticulosis (removed) 2015 Feb surgery, (Written 04/28/2015 1421)   Cholecalciferol (VITAMIN D-3) 5000 UNITS TABS 5,000 Units, Daily     Calcium Carb-Cholecalciferol (CALTRATE 600+D) 600-800 MG-UNIT TABS 1 tablet, Daily     aspirin EC 81 MG tablet 81 mg, Daily at bedtime     Note: - (Written 06/17/2014 1006)   acetaminophen (TYLENOL) 500 MG tablet 500 mg, Every 8 hours PRN       Medications self-administered by patient during sleep study : No sleep medicine administered.  SLEEP ARCHITECTURE The study was initiated at 11:11:30 PM and ended at 5:15:26 AM. Sleep onset time was 23.2 minutes and the sleep efficiency was 55.7%. The total sleep time was 202.8 minutes. Wake after sleep onset (WASO) was 138 minutes. Stage REM latency was 205.0 minutes. The patient spent 3.45% of the night in stage N1 sleep, 79.29% in stage N2 sleep, 0.00% in stage N3 and 17.26% in REM. Alpha intrusion was absent. Supine sleep was 38.17%.  RESPIRATORY PARAMETERS The overall apnea/hypopnea index (AHI) was 0.0 per hour. There were 0 total apneas, including 0 obstructive, 0 central and 0 mixed apneas. There were 0 hypopneas and 0 RERAs. The AHI during Stage REM sleep was 0.0 per hour. AHI while supine was 0.0 per hour. The mean oxygen saturation was 94.21%. The minimum SpO2 during sleep was 88.00%. Moderate  snoring was noted during this study.  CARDIAC DATA The 2 lead EKG demonstrated sinus rhythm. The mean heart rate was 44.41 beats per minute. Other EKG findings include: PVCs.  LEG MOVEMENT DATA The total PLMS were 76 with a resulting PLMS index of 22.49. Associated arousal with leg movement index was 0.0 .  IMPRESSIONS - No significant obstructive sleep apnea occurred during this study (AHI = 0.0/h). - No significant central sleep apnea occurred during this study (CAI = 0.0/h). - Minimal oxygen desaturation to a nadir of 88.00%. - Reduced  sleep efficiency. - The patient snored with Moderate snoring volume. - EKG findings include PVCs. - Mild periodic limb movements of sleep occurred during the study. No significant associated arousals.  DIAGNOSIS - Periodic Limb Movement of Sleep   RECOMMENDATIONS - There is no indication for CPAP therapy. - Consider alternatives for moderate snoring. - If patient is symptomatic with restless legs, consider a trial of medical therapy. - Avoid alcohol, sedatives and other CNS depressants that may worsen sleep apnea and disrupt normal sleep architecture. - Sleep hygiene should be reviewed to assess factors that may improve sleep quality. - Weight management and regular exercise should be initiated or continued if appropriate.   Troy Sine, MD, [place], American Board of Sleep Medicine  ELECTRONICALLY SIGNED ON:  05/16/2016, 2:46 PM [place] PH: [phone]   FX: [phone] [place]

## 2016-05-18 ENCOUNTER — Telehealth: Payer: Self-pay | Admitting: *Deleted

## 2016-05-18 NOTE — Telephone Encounter (Signed)
Message     [name], please notify pt of results; no need for CPAP   Spoke with pt, aware of results.

## 2016-05-24 ENCOUNTER — Ambulatory Visit (INDEPENDENT_AMBULATORY_CARE_PROVIDER_SITE_OTHER): Payer: Medicare Other | Admitting: Pharmacist

## 2016-05-24 DIAGNOSIS — I480 Paroxysmal atrial fibrillation: Secondary | ICD-10-CM

## 2016-05-24 DIAGNOSIS — I4891 Unspecified atrial fibrillation: Secondary | ICD-10-CM

## 2016-05-24 DIAGNOSIS — Z7901 Long term (current) use of anticoagulants: Secondary | ICD-10-CM | POA: Diagnosis not present

## 2016-05-24 LAB — POCT INR: INR: 2.8

## 2016-05-25 DIAGNOSIS — H02003 Unspecified entropion of right eye, unspecified eyelid: Secondary | ICD-10-CM | POA: Diagnosis not present

## 2016-06-06 DIAGNOSIS — H2512 Age-related nuclear cataract, left eye: Secondary | ICD-10-CM | POA: Diagnosis not present

## 2016-06-06 DIAGNOSIS — M3501 Sicca syndrome with keratoconjunctivitis: Secondary | ICD-10-CM | POA: Diagnosis not present

## 2016-06-06 DIAGNOSIS — H02012 Cicatricial entropion of right lower eyelid: Secondary | ICD-10-CM | POA: Diagnosis not present

## 2016-06-06 DIAGNOSIS — H2511 Age-related nuclear cataract, right eye: Secondary | ICD-10-CM | POA: Diagnosis not present

## 2016-06-14 DIAGNOSIS — M3501 Sicca syndrome with keratoconjunctivitis: Secondary | ICD-10-CM | POA: Diagnosis not present

## 2016-06-14 DIAGNOSIS — H02012 Cicatricial entropion of right lower eyelid: Secondary | ICD-10-CM | POA: Diagnosis not present

## 2016-06-14 DIAGNOSIS — H2511 Age-related nuclear cataract, right eye: Secondary | ICD-10-CM | POA: Diagnosis not present

## 2016-06-19 DIAGNOSIS — H02003 Unspecified entropion of right eye, unspecified eyelid: Secondary | ICD-10-CM | POA: Diagnosis not present

## 2016-06-23 ENCOUNTER — Ambulatory Visit (INDEPENDENT_AMBULATORY_CARE_PROVIDER_SITE_OTHER): Payer: Medicare Other | Admitting: Pharmacist

## 2016-06-23 DIAGNOSIS — Z7901 Long term (current) use of anticoagulants: Secondary | ICD-10-CM

## 2016-06-23 DIAGNOSIS — I480 Paroxysmal atrial fibrillation: Secondary | ICD-10-CM | POA: Diagnosis not present

## 2016-06-23 DIAGNOSIS — I4891 Unspecified atrial fibrillation: Secondary | ICD-10-CM | POA: Diagnosis not present

## 2016-06-23 LAB — POCT INR: INR: 4

## 2016-06-24 ENCOUNTER — Other Ambulatory Visit: Payer: Self-pay | Admitting: Cardiovascular Disease

## 2016-07-05 DIAGNOSIS — H02032 Senile entropion of right lower eyelid: Secondary | ICD-10-CM | POA: Diagnosis not present

## 2016-07-05 DIAGNOSIS — H02834 Dermatochalasis of left upper eyelid: Secondary | ICD-10-CM | POA: Diagnosis not present

## 2016-07-05 DIAGNOSIS — H02135 Senile ectropion of left lower eyelid: Secondary | ICD-10-CM | POA: Diagnosis not present

## 2016-07-05 DIAGNOSIS — H02831 Dermatochalasis of right upper eyelid: Secondary | ICD-10-CM | POA: Diagnosis not present

## 2016-07-07 DIAGNOSIS — H02003 Unspecified entropion of right eye, unspecified eyelid: Secondary | ICD-10-CM | POA: Diagnosis not present

## 2016-07-14 HISTORY — PX: BLEPHAROPLASTY: SUR158

## 2016-07-19 ENCOUNTER — Ambulatory Visit (INDEPENDENT_AMBULATORY_CARE_PROVIDER_SITE_OTHER): Payer: Medicare Other | Admitting: Cardiovascular Disease

## 2016-07-19 ENCOUNTER — Encounter: Payer: Self-pay | Admitting: Cardiovascular Disease

## 2016-07-19 ENCOUNTER — Ambulatory Visit (INDEPENDENT_AMBULATORY_CARE_PROVIDER_SITE_OTHER): Payer: Medicare Other | Admitting: Pharmacist

## 2016-07-19 VITALS — BP 144/80 | HR 60 | Ht 63.0 in | Wt 160.2 lb

## 2016-07-19 DIAGNOSIS — I480 Paroxysmal atrial fibrillation: Secondary | ICD-10-CM

## 2016-07-19 DIAGNOSIS — I2510 Atherosclerotic heart disease of native coronary artery without angina pectoris: Secondary | ICD-10-CM | POA: Diagnosis not present

## 2016-07-19 DIAGNOSIS — I4891 Unspecified atrial fibrillation: Secondary | ICD-10-CM

## 2016-07-19 DIAGNOSIS — R5383 Other fatigue: Secondary | ICD-10-CM

## 2016-07-19 DIAGNOSIS — I10 Essential (primary) hypertension: Secondary | ICD-10-CM

## 2016-07-19 DIAGNOSIS — R0683 Snoring: Secondary | ICD-10-CM

## 2016-07-19 DIAGNOSIS — Z7901 Long term (current) use of anticoagulants: Secondary | ICD-10-CM | POA: Diagnosis not present

## 2016-07-19 DIAGNOSIS — I2583 Coronary atherosclerosis due to lipid rich plaque: Secondary | ICD-10-CM

## 2016-07-19 LAB — POCT INR: INR: 4.7

## 2016-07-19 MED ORDER — LISINOPRIL 20 MG PO TABS: ORAL_TABLET | ORAL | 6 refills | Status: DC

## 2016-07-19 NOTE — Patient Instructions (Signed)
Your physician has recommended you make the following change in your medication:   1.) the lisinopril has been changed to 20 mg in the morning ( 1 tablet) and 10 mg at night (1/2 tablet)  Dr Seleta Rhymes you to have your eye surgery. He recommends that you hold your coumadin (warfarin) 4 days prior to your surgery.  Your physician recommends that you schedule a follow-up appointment in: 3-4 months.

## 2016-07-21 ENCOUNTER — Encounter: Payer: Self-pay | Admitting: Cardiovascular Disease

## 2016-07-21 DIAGNOSIS — R5383 Other fatigue: Secondary | ICD-10-CM | POA: Insufficient documentation

## 2016-07-21 DIAGNOSIS — H02032 Senile entropion of right lower eyelid: Secondary | ICD-10-CM | POA: Diagnosis not present

## 2016-07-21 DIAGNOSIS — R0683 Snoring: Secondary | ICD-10-CM | POA: Insufficient documentation
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
## 2016-07-31 ENCOUNTER — Other Ambulatory Visit: Payer: Self-pay | Admitting: Cardiovascular Disease

## 2016-08-01 NOTE — Telephone Encounter (Signed)
Rx has been sent to the pharmacy electronically. ° °

## 2016-08-02 ENCOUNTER — Ambulatory Visit (INDEPENDENT_AMBULATORY_CARE_PROVIDER_SITE_OTHER): Payer: Medicare Other | Admitting: Pharmacist Clinician (PhC)/ Clinical Pharmacy Specialist

## 2016-08-02 DIAGNOSIS — Z7901 Long term (current) use of anticoagulants: Secondary | ICD-10-CM | POA: Diagnosis not present

## 2016-08-02 DIAGNOSIS — I480 Paroxysmal atrial fibrillation: Secondary | ICD-10-CM

## 2016-08-02 DIAGNOSIS — I4891 Unspecified atrial fibrillation: Secondary | ICD-10-CM

## 2016-08-02 LAB — POCT INR: INR: 2.7

## 2016-08-07 DIAGNOSIS — I10 Essential (primary) hypertension: Secondary | ICD-10-CM | POA: Diagnosis not present

## 2016-08-07 DIAGNOSIS — H02135 Senile ectropion of left lower eyelid: Secondary | ICD-10-CM | POA: Diagnosis not present

## 2016-08-07 DIAGNOSIS — H02831 Dermatochalasis of right upper eyelid: Secondary | ICD-10-CM | POA: Diagnosis not present

## 2016-08-07 DIAGNOSIS — H02032 Senile entropion of right lower eyelid: Secondary | ICD-10-CM | POA: Diagnosis not present

## 2016-08-07 DIAGNOSIS — I739 Peripheral vascular disease, unspecified: Secondary | ICD-10-CM | POA: Diagnosis not present

## 2016-08-07 DIAGNOSIS — H02834 Dermatochalasis of left upper eyelid: Secondary | ICD-10-CM | POA: Diagnosis not present

## 2016-08-07 DIAGNOSIS — Z01818 Encounter for other preprocedural examination: Secondary | ICD-10-CM | POA: Diagnosis not present

## 2016-08-07 DIAGNOSIS — I2510 Atherosclerotic heart disease of native coronary artery without angina pectoris: Secondary | ICD-10-CM | POA: Diagnosis not present

## 2016-08-22 ENCOUNTER — Other Ambulatory Visit: Payer: Self-pay | Admitting: Cardiovascular Disease

## 2016-08-23 ENCOUNTER — Ambulatory Visit (INDEPENDENT_AMBULATORY_CARE_PROVIDER_SITE_OTHER): Payer: Medicare Other | Admitting: Pharmacist

## 2016-08-23 DIAGNOSIS — Z7901 Long term (current) use of anticoagulants: Secondary | ICD-10-CM

## 2016-08-23 DIAGNOSIS — I4891 Unspecified atrial fibrillation: Secondary | ICD-10-CM | POA: Diagnosis not present

## 2016-08-23 DIAGNOSIS — I480 Paroxysmal atrial fibrillation: Secondary | ICD-10-CM

## 2016-08-23 LAB — POCT INR: INR: 2.7

## 2016-08-30 DIAGNOSIS — R0781 Pleurodynia: Secondary | ICD-10-CM | POA: Diagnosis not present

## 2016-08-30 DIAGNOSIS — R1011 Right upper quadrant pain: Secondary | ICD-10-CM | POA: Diagnosis not present

## 2016-08-31 ENCOUNTER — Other Ambulatory Visit: Payer: Self-pay | Admitting: Family Medicine

## 2016-08-31 DIAGNOSIS — R1011 Right upper quadrant pain: Secondary | ICD-10-CM

## 2016-09-05 ENCOUNTER — Ambulatory Visit
Admission: RE | Admit: 2016-09-05 | Discharge: 2016-09-05 | Disposition: A | Discharge status: Home / Self Care | Payer: Medicare Other | Source: Ambulatory Visit | Attending: Family Medicine | Admitting: Family Medicine

## 2016-09-05 DIAGNOSIS — R1011 Right upper quadrant pain: Secondary | ICD-10-CM | POA: Diagnosis not present

## 2016-09-05 DIAGNOSIS — R932 Abnormal findings on diagnostic imaging of liver and biliary tract: Secondary | ICD-10-CM | POA: Insufficient documentation

## 2016-09-06 DIAGNOSIS — H02012 Cicatricial entropion of right lower eyelid: Secondary | ICD-10-CM | POA: Diagnosis not present

## 2016-09-06 DIAGNOSIS — H2512 Age-related nuclear cataract, left eye: Secondary | ICD-10-CM | POA: Diagnosis not present

## 2016-09-06 DIAGNOSIS — H2511 Age-related nuclear cataract, right eye: Secondary | ICD-10-CM | POA: Diagnosis not present

## 2016-09-06 DIAGNOSIS — M3501 Sicca syndrome with keratoconjunctivitis: Secondary | ICD-10-CM | POA: Diagnosis not present

## 2016-09-07 ENCOUNTER — Other Ambulatory Visit: Payer: Self-pay | Admitting: Cardiovascular Disease

## 2016-09-12 ENCOUNTER — Encounter: Payer: Self-pay | Admitting: Pharmacist

## 2016-09-12 ENCOUNTER — Telehealth: Payer: Self-pay | Admitting: Pharmacist

## 2016-09-12 NOTE — Telephone Encounter (Signed)
Pt called to report she has restarted turmeric for rib pain. She states it has really helped with the pain.  Discussed bleeding risk and to monitor for changes with stool and urine. She already has f/u INR scheduled for next week.   Pt states understanding and appreciation.

## 2016-09-21 ENCOUNTER — Ambulatory Visit (INDEPENDENT_AMBULATORY_CARE_PROVIDER_SITE_OTHER): Payer: Medicare Other | Admitting: Pharmacist

## 2016-09-21 DIAGNOSIS — I480 Paroxysmal atrial fibrillation: Secondary | ICD-10-CM

## 2016-09-21 DIAGNOSIS — Z7901 Long term (current) use of anticoagulants: Secondary | ICD-10-CM | POA: Diagnosis not present

## 2016-09-21 DIAGNOSIS — I4891 Unspecified atrial fibrillation: Secondary | ICD-10-CM | POA: Diagnosis not present

## 2016-09-21 LAB — POCT INR: INR: 4

## 2016-09-22 ENCOUNTER — Other Ambulatory Visit: Payer: Self-pay | Admitting: Cardiovascular Disease

## 2016-09-25 ENCOUNTER — Ambulatory Visit (INDEPENDENT_AMBULATORY_CARE_PROVIDER_SITE_OTHER): Payer: Medicare Other | Admitting: Pharmacist Clinician (PhC)/ Clinical Pharmacy Specialist

## 2016-09-25 ENCOUNTER — Other Ambulatory Visit: Payer: Self-pay | Admitting: Pharmacist Clinician (PhC)/ Clinical Pharmacy Specialist

## 2016-09-25 DIAGNOSIS — I2583 Coronary atherosclerosis due to lipid rich plaque: Secondary | ICD-10-CM

## 2016-09-25 DIAGNOSIS — I4891 Unspecified atrial fibrillation: Secondary | ICD-10-CM | POA: Diagnosis not present

## 2016-09-25 DIAGNOSIS — I2510 Atherosclerotic heart disease of native coronary artery without angina pectoris: Secondary | ICD-10-CM

## 2016-09-25 DIAGNOSIS — I480 Paroxysmal atrial fibrillation: Secondary | ICD-10-CM | POA: Diagnosis not present

## 2016-09-25 DIAGNOSIS — Z7901 Long term (current) use of anticoagulants: Secondary | ICD-10-CM

## 2016-09-25 DIAGNOSIS — Z23 Encounter for immunization: Secondary | ICD-10-CM | POA: Diagnosis not present

## 2016-09-25 LAB — POCT INR: INR: 2

## 2016-09-25 MED ORDER — EZETIMIBE 10 MG PO TABS: 10.0000 mg | ORAL_TABLET | Freq: Every evening | ORAL | 5 refills | Status: DC

## 2016-09-25 MED ORDER — WARFARIN SODIUM 2.5 MG PO TABS: ORAL_TABLET | ORAL | 1 refills | Status: DC

## 2016-09-25 MED ORDER — SIMVASTATIN 20 MG PO TABS: 20.0000 mg | ORAL_TABLET | Freq: Every day | ORAL | 5 refills | Status: DC

## 2016-09-26 NOTE — Telephone Encounter (Signed)
ezetimibe (ZETIA) 10 MG tablet  Medication  Date: 09/25/2016 Department: [facility] Ordering/Authorizing: Troy Sine, MD  Order Providers   Prescribing Provider Encounter Provider  Troy Sine, MD Rockne Menghini, RPH-CPP  Medication Detail    Disp Refills Start End   ezetimibe (ZETIA) 10 MG tablet 30 tablet 5 09/25/2016    Sig - Route: Take 1 tablet (10 mg total) by mouth every evening. - Oral   E-Prescribing Status: Receipt confirmed by pharmacy (09/25/2016 3:59 PM EST)   Pharmacy   [facility] [id number] [facility] [place]

## 2016-10-09 ENCOUNTER — Ambulatory Visit (INDEPENDENT_AMBULATORY_CARE_PROVIDER_SITE_OTHER): Payer: Medicare Other | Admitting: Pharmacist Clinician (PhC)/ Clinical Pharmacy Specialist

## 2016-10-09 DIAGNOSIS — I480 Paroxysmal atrial fibrillation: Secondary | ICD-10-CM

## 2016-10-09 DIAGNOSIS — Z7901 Long term (current) use of anticoagulants: Secondary | ICD-10-CM

## 2016-10-09 DIAGNOSIS — I2510 Atherosclerotic heart disease of native coronary artery without angina pectoris: Secondary | ICD-10-CM

## 2016-10-09 DIAGNOSIS — I4891 Unspecified atrial fibrillation: Secondary | ICD-10-CM | POA: Diagnosis not present

## 2016-10-09 DIAGNOSIS — I2583 Coronary atherosclerosis due to lipid rich plaque: Secondary | ICD-10-CM

## 2016-10-09 LAB — POCT INR: INR: 2.7

## 2016-10-19 ENCOUNTER — Ambulatory Visit (INDEPENDENT_AMBULATORY_CARE_PROVIDER_SITE_OTHER): Payer: Medicare Other | Admitting: Cardiovascular Disease

## 2016-10-19 ENCOUNTER — Encounter: Payer: Self-pay | Admitting: Cardiovascular Disease

## 2016-10-19 VITALS — BP 139/72 | HR 55 | Ht 63.0 in | Wt 160.8 lb

## 2016-10-19 DIAGNOSIS — I480 Paroxysmal atrial fibrillation: Secondary | ICD-10-CM

## 2016-10-19 DIAGNOSIS — I2583 Coronary atherosclerosis due to lipid rich plaque: Secondary | ICD-10-CM

## 2016-10-19 DIAGNOSIS — E784 Other hyperlipidemia: Secondary | ICD-10-CM

## 2016-10-19 DIAGNOSIS — I2510 Atherosclerotic heart disease of native coronary artery without angina pectoris: Secondary | ICD-10-CM

## 2016-10-19 DIAGNOSIS — E7849 Other hyperlipidemia: Secondary | ICD-10-CM

## 2016-10-19 DIAGNOSIS — H2512 Age-related nuclear cataract, left eye: Secondary | ICD-10-CM | POA: Diagnosis not present

## 2016-10-19 DIAGNOSIS — Z7901 Long term (current) use of anticoagulants: Secondary | ICD-10-CM

## 2016-10-19 DIAGNOSIS — I10 Essential (primary) hypertension: Secondary | ICD-10-CM

## 2016-10-19 DIAGNOSIS — E785 Hyperlipidemia, unspecified: Secondary | ICD-10-CM

## 2016-10-19 DIAGNOSIS — H16223 Keratoconjunctivitis sicca, not specified as Sjogren's, bilateral: Secondary | ICD-10-CM | POA: Diagnosis not present

## 2016-10-19 MED ORDER — LISINOPRIL 20 MG PO TABS: ORAL_TABLET | ORAL | 6 refills | Status: DC

## 2016-10-19 NOTE — Patient Instructions (Signed)
Medication Instructions:    Labwork:  FASTING  Testing/Procedures:  lexiscan MAY 2018  Follow-Up:  Your physician wants you to follow-up in: 6 MONTHS OR SOONER IF NEEDED. You will receive a reminder letter in the mail two months in advance. If you don't receive a letter, please call our office to schedule the follow-up appointment.   Any Other Special Instructions Will Be Listed Below (If Applicable).

## 2016-10-20 ENCOUNTER — Ambulatory Visit: Payer: Medicare Other | Admitting: Cardiovascular Disease
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
## 2016-10-26 DIAGNOSIS — I10 Essential (primary) hypertension: Secondary | ICD-10-CM | POA: Diagnosis not present

## 2016-10-26 DIAGNOSIS — I2510 Atherosclerotic heart disease of native coronary artery without angina pectoris: Secondary | ICD-10-CM | POA: Diagnosis not present

## 2016-10-26 DIAGNOSIS — E784 Other hyperlipidemia: Secondary | ICD-10-CM | POA: Diagnosis not present

## 2016-10-26 DIAGNOSIS — I480 Paroxysmal atrial fibrillation: Secondary | ICD-10-CM | POA: Diagnosis not present

## 2016-10-26 DIAGNOSIS — I2583 Coronary atherosclerosis due to lipid rich plaque: Secondary | ICD-10-CM | POA: Diagnosis not present

## 2016-10-27 DIAGNOSIS — Z1231 Encounter for screening mammogram for malignant neoplasm of breast: Secondary | ICD-10-CM | POA: Diagnosis not present

## 2016-10-27 LAB — COMPREHENSIVE METABOLIC PANEL
ALT: 23 U/L (ref 6–29)
AST: 22 U/L (ref 10–35)
Albumin: 4.2 g/dL (ref 3.6–5.1)
Alkaline Phosphatase: 43 U/L (ref 33–130)
BUN: 11 mg/dL (ref 7–25)
CO2: 29 mmol/L (ref 20–31)
Calcium: 8.9 mg/dL (ref 8.6–10.4)
Chloride: 102 mmol/L (ref 98–110)
Creat: 0.62 mg/dL (ref 0.60–0.93)
Glucose, Bld: 90 mg/dL (ref 65–99)
Potassium: 4.6 mmol/L (ref 3.5–5.3)
Sodium: 140 mmol/L (ref 135–146)
Total Bilirubin: 0.5 mg/dL (ref 0.2–1.2)
Total Protein: 6.2 g/dL (ref 6.1–8.1)

## 2016-10-27 LAB — CBC
HCT: 41.7 % (ref 35.0–45.0)
Hemoglobin: 13.8 g/dL (ref 11.7–15.5)
MCH: 30.9 pg (ref 27.0–33.0)
MCHC: 33.1 g/dL (ref 32.0–36.0)
MCV: 93.3 fL (ref 80.0–100.0)
MPV: 8.6 fL (ref 7.5–12.5)
Platelets: 220 10*3/uL (ref 140–400)
RBC: 4.47 MIL/uL (ref 3.80–5.10)
RDW: 13.9 % (ref 11.0–15.0)
WBC: 9.6 10*3/uL (ref 3.8–10.8)

## 2016-10-27 LAB — LIPID PANEL
Cholesterol: 185 mg/dL (ref <200)
HDL: 70 mg/dL (ref >50)
LDL Cholesterol: 93 mg/dL (ref <100)
Total CHOL/HDL Ratio: 2.6 Ratio (ref <5.0)
Triglycerides: 110 mg/dL (ref <150)
VLDL: 22 mg/dL (ref <30)

## 2016-10-27 LAB — TSH: TSH: 2.48 mIU/L

## 2016-11-01 ENCOUNTER — Telehealth: Payer: Self-pay | Admitting: Cardiovascular Disease

## 2016-11-01 NOTE — Telephone Encounter (Signed)
Patient notified of lab results. Advised her results will be in EPIC  Routed to [place], [place] as [employer]

## 2016-11-01 NOTE — Telephone Encounter (Signed)
Spoke with pt, lab results discussed

## 2016-11-01 NOTE — Telephone Encounter (Signed)
New Message ° °Pt call requesting to speak with RN about results. Please call back to discuss  °

## 2016-11-13 DIAGNOSIS — C3490 Malignant neoplasm of unspecified part of unspecified bronchus or lung: Secondary | ICD-10-CM

## 2016-11-13 HISTORY — DX: Malignant neoplasm of unspecified part of unspecified bronchus or lung: C34.90

## 2016-11-15 ENCOUNTER — Ambulatory Visit (INDEPENDENT_AMBULATORY_CARE_PROVIDER_SITE_OTHER): Payer: Medicare Other | Admitting: Pharmacist

## 2016-11-15 DIAGNOSIS — I4891 Unspecified atrial fibrillation: Secondary | ICD-10-CM

## 2016-11-15 DIAGNOSIS — Z7901 Long term (current) use of anticoagulants: Secondary | ICD-10-CM | POA: Diagnosis not present

## 2016-11-15 DIAGNOSIS — I480 Paroxysmal atrial fibrillation: Secondary | ICD-10-CM

## 2016-11-15 LAB — POCT INR: INR: 2.8

## 2016-11-21 DIAGNOSIS — Z85828 Personal history of other malignant neoplasm of skin: Secondary | ICD-10-CM | POA: Diagnosis not present

## 2016-11-21 DIAGNOSIS — B001 Herpesviral vesicular dermatitis: Secondary | ICD-10-CM | POA: Diagnosis not present

## 2016-11-21 DIAGNOSIS — D2271 Melanocytic nevi of right lower limb, including hip: Secondary | ICD-10-CM | POA: Diagnosis not present

## 2016-11-21 DIAGNOSIS — D225 Melanocytic nevi of trunk: Secondary | ICD-10-CM | POA: Diagnosis not present

## 2016-11-22 ENCOUNTER — Telehealth: Payer: Self-pay | Admitting: Cardiovascular Disease

## 2016-11-22 DIAGNOSIS — R0609 Other forms of dyspnea: Secondary | ICD-10-CM

## 2016-11-22 DIAGNOSIS — R0600 Dyspnea, unspecified: Secondary | ICD-10-CM

## 2016-11-22 DIAGNOSIS — I272 Pulmonary hypertension, unspecified: Secondary | ICD-10-CM

## 2016-11-22 DIAGNOSIS — R5383 Other fatigue: Secondary | ICD-10-CM

## 2016-11-22 NOTE — Telephone Encounter (Signed)
Pt wants Dr Claiborne Billings to refer her to a good pulmonary doctor please.

## 2016-11-22 NOTE — Telephone Encounter (Signed)
Spoke w patient. She states no urgent/acute symptoms.  She notes she has chronic dyspnea and a pulmonary referral was discussed at last visit w Dr. Claiborne Billings. She wanted to get him to refer to pulm rather than PCP, as was previously suggested - notes her PCP is w [facility] and she would like someone to see her who is in [facility] system. Aware I will see if referral OK from Dr. Claiborne Billings and follow up w her.

## 2016-11-23 ENCOUNTER — Telehealth: Payer: Self-pay | Admitting: Cardiovascular Disease

## 2016-11-23 NOTE — Telephone Encounter (Signed)
Left msg for patient advising of referral. Order placed. Patient made aware in msg that the office will contact, but to call us if she has questions.

## 2016-11-23 NOTE — Telephone Encounter (Signed)
Can refer to [place] [facility]; possibly Dr. Lake Bells

## 2016-11-23 NOTE — Telephone Encounter (Signed)
Msg was left for patient to call if questions regarding Dr. Evette Georges recommendation for pulmonary referral. 2nd call also goes to VM.

## 2016-11-23 NOTE — Telephone Encounter (Signed)
New message ° ° ° °Pt verbalized that she is returning call for rn  °

## 2016-12-14 ENCOUNTER — Ambulatory Visit (INDEPENDENT_AMBULATORY_CARE_PROVIDER_SITE_OTHER): Payer: Medicare Other | Admitting: Pharmacist Clinician (PhC)/ Clinical Pharmacy Specialist

## 2016-12-14 DIAGNOSIS — I2510 Atherosclerotic heart disease of native coronary artery without angina pectoris: Secondary | ICD-10-CM | POA: Diagnosis not present

## 2016-12-14 DIAGNOSIS — I4891 Unspecified atrial fibrillation: Secondary | ICD-10-CM

## 2016-12-14 DIAGNOSIS — Z7901 Long term (current) use of anticoagulants: Secondary | ICD-10-CM

## 2016-12-14 DIAGNOSIS — I480 Paroxysmal atrial fibrillation: Secondary | ICD-10-CM | POA: Diagnosis not present

## 2016-12-14 LAB — POCT INR: INR: 2.3

## 2016-12-19 ENCOUNTER — Other Ambulatory Visit: Payer: Self-pay

## 2016-12-19 MED ORDER — METOPROLOL TARTRATE 25 MG PO TABS: 25.0000 mg | ORAL_TABLET | Freq: Every day | ORAL | 1 refills | Status: DC | PRN

## 2016-12-29 ENCOUNTER — Ambulatory Visit (INDEPENDENT_AMBULATORY_CARE_PROVIDER_SITE_OTHER): Payer: Medicare Other | Admitting: Pulmonary Disease

## 2016-12-29 ENCOUNTER — Encounter: Payer: Self-pay | Admitting: Pulmonary Disease

## 2016-12-29 VITALS — BP 144/76 | HR 68 | Ht 63.0 in | Wt 162.0 lb

## 2016-12-29 DIAGNOSIS — R0602 Shortness of breath: Secondary | ICD-10-CM

## 2016-12-29 DIAGNOSIS — Z87891 Personal history of nicotine dependence: Secondary | ICD-10-CM | POA: Diagnosis not present

## 2016-12-29 DIAGNOSIS — J432 Centrilobular emphysema: Secondary | ICD-10-CM | POA: Insufficient documentation

## 2016-12-29 DIAGNOSIS — F17210 Nicotine dependence, cigarettes, uncomplicated: Secondary | ICD-10-CM | POA: Insufficient documentation
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
## 2016-12-29 NOTE — Patient Instructions (Signed)
We will call you with the results of the lung function test We will arrange for you to go to the lung cancer screening program for a CT scan of your chest We will see you back in 4-6 weeks

## 2016-12-29 NOTE — Assessment & Plan Note (Signed)
She quit smoking in 2008 after smoking for 40 years, 1 pack per day. She is at increased risk for lung cancer. We will refer her to the lung cancer screening program

## 2016-12-29 NOTE — Assessment & Plan Note (Signed)
[name] describes dyspnea, chest tightness and wheezing which is been progressive in the last year. Objectively she has clear lungs on exam, a recent hemoglobin was normal, and her neurologic exam is within normal limits.  The differential diagnosis of her dyspnea includes COPD considering her heavy smoking history. Less likely considerations would be some sort of interstitial lung disease or a new cardiac problem.  Plan: Pulmonary function test Ambulatory oximetry monitoring here in the office Follow-up 4-6 weeks

## 2017-01-01 ENCOUNTER — Encounter: Payer: Self-pay | Admitting: Nurse Practitioner

## 2017-01-01 ENCOUNTER — Telehealth: Payer: Self-pay | Admitting: Cardiovascular Disease

## 2017-01-01 NOTE — Telephone Encounter (Signed)
New Message   Per pt has been in Afib since Friday, and requesting a visit for today. Requesting a call back

## 2017-01-01 NOTE — Telephone Encounter (Signed)
Returned call to patient.She stated her heart has been out of rhythm since Friday 12/29/16.Stated she has been taking a extra 1/2 metoprolol.Rate ranging 130 to 140.Spoke to DOD Dr.Hilty he advised to schedule appointment.No appointments available today.He advised ok to be seen tomorrow 01/02/17.Advised make sure she does not miss taking coumadin.Appointment scheduled with Truitt Merle NP 01/02/17 at 11:00 am at [facility] office.Advised to go to ER if needed.

## 2017-01-02 ENCOUNTER — Encounter: Payer: Self-pay | Admitting: Nurse Practitioner

## 2017-01-02 ENCOUNTER — Encounter (HOSPITAL_COMMUNITY): Payer: Self-pay | Admitting: Emergency Medicine

## 2017-01-02 ENCOUNTER — Ambulatory Visit (INDEPENDENT_AMBULATORY_CARE_PROVIDER_SITE_OTHER): Payer: Medicare Other | Admitting: Nurse Practitioner

## 2017-01-02 ENCOUNTER — Inpatient Hospital Stay (HOSPITAL_COMMUNITY)
Admission: EM | Admit: 2017-01-02 | Discharge: 2017-01-04 | DRG: 309 | Disposition: A | Discharge status: Home / Self Care | Payer: Medicare Other | Attending: Cardiology | Admitting: Cardiology

## 2017-01-02 ENCOUNTER — Emergency Department (HOSPITAL_COMMUNITY): Payer: Medicare Other

## 2017-01-02 VITALS — BP 108/60 | HR 140 | Ht 63.0 in | Wt 161.6 lb

## 2017-01-02 DIAGNOSIS — Z8249 Family history of ischemic heart disease and other diseases of the circulatory system: Secondary | ICD-10-CM

## 2017-01-02 DIAGNOSIS — I483 Typical atrial flutter: Principal | ICD-10-CM | POA: Diagnosis present

## 2017-01-02 DIAGNOSIS — J432 Centrilobular emphysema: Secondary | ICD-10-CM | POA: Diagnosis present

## 2017-01-02 DIAGNOSIS — Z880 Allergy status to penicillin: Secondary | ICD-10-CM

## 2017-01-02 DIAGNOSIS — I480 Paroxysmal atrial fibrillation: Secondary | ICD-10-CM | POA: Diagnosis present

## 2017-01-02 DIAGNOSIS — I959 Hypotension, unspecified: Secondary | ICD-10-CM | POA: Diagnosis present

## 2017-01-02 DIAGNOSIS — Z7982 Long term (current) use of aspirin: Secondary | ICD-10-CM

## 2017-01-02 DIAGNOSIS — I4891 Unspecified atrial fibrillation: Secondary | ICD-10-CM | POA: Diagnosis not present

## 2017-01-02 DIAGNOSIS — I739 Peripheral vascular disease, unspecified: Secondary | ICD-10-CM | POA: Diagnosis present

## 2017-01-02 DIAGNOSIS — R7989 Other specified abnormal findings of blood chemistry: Secondary | ICD-10-CM | POA: Diagnosis not present

## 2017-01-02 DIAGNOSIS — I272 Pulmonary hypertension, unspecified: Secondary | ICD-10-CM | POA: Diagnosis not present

## 2017-01-02 DIAGNOSIS — Z79899 Other long term (current) drug therapy: Secondary | ICD-10-CM

## 2017-01-02 DIAGNOSIS — Z951 Presence of aortocoronary bypass graft: Secondary | ICD-10-CM

## 2017-01-02 DIAGNOSIS — I248 Other forms of acute ischemic heart disease: Secondary | ICD-10-CM | POA: Diagnosis not present

## 2017-01-02 DIAGNOSIS — Z888 Allergy status to other drugs, medicaments and biological substances status: Secondary | ICD-10-CM

## 2017-01-02 DIAGNOSIS — I4892 Unspecified atrial flutter: Secondary | ICD-10-CM | POA: Diagnosis present

## 2017-01-02 DIAGNOSIS — R0602 Shortness of breath: Secondary | ICD-10-CM

## 2017-01-02 DIAGNOSIS — I252 Old myocardial infarction: Secondary | ICD-10-CM

## 2017-01-02 DIAGNOSIS — Z7901 Long term (current) use of anticoagulants: Secondary | ICD-10-CM

## 2017-01-02 DIAGNOSIS — I2510 Atherosclerotic heart disease of native coronary artery without angina pectoris: Secondary | ICD-10-CM | POA: Diagnosis present

## 2017-01-02 DIAGNOSIS — I25118 Atherosclerotic heart disease of native coronary artery with other forms of angina pectoris: Secondary | ICD-10-CM | POA: Diagnosis present

## 2017-01-02 DIAGNOSIS — E785 Hyperlipidemia, unspecified: Secondary | ICD-10-CM | POA: Diagnosis present

## 2017-01-02 DIAGNOSIS — I499 Cardiac arrhythmia, unspecified: Secondary | ICD-10-CM | POA: Diagnosis not present

## 2017-01-02 DIAGNOSIS — I5022 Chronic systolic (congestive) heart failure: Secondary | ICD-10-CM | POA: Diagnosis not present

## 2017-01-02 DIAGNOSIS — F419 Anxiety disorder, unspecified: Secondary | ICD-10-CM | POA: Diagnosis present

## 2017-01-02 DIAGNOSIS — I10 Essential (primary) hypertension: Secondary | ICD-10-CM | POA: Diagnosis present

## 2017-01-02 DIAGNOSIS — R002 Palpitations: Secondary | ICD-10-CM | POA: Diagnosis not present

## 2017-01-02 DIAGNOSIS — Z87891 Personal history of nicotine dependence: Secondary | ICD-10-CM

## 2017-01-02 DIAGNOSIS — M3500 Sicca syndrome, unspecified: Secondary | ICD-10-CM | POA: Diagnosis present

## 2017-01-02 DIAGNOSIS — Z881 Allergy status to other antibiotic agents status: Secondary | ICD-10-CM

## 2017-01-02 DIAGNOSIS — R778 Other specified abnormalities of plasma proteins: Secondary | ICD-10-CM

## 2017-01-02 DIAGNOSIS — Z85828 Personal history of other malignant neoplasm of skin: Secondary | ICD-10-CM

## 2017-01-02 DIAGNOSIS — I110 Hypertensive heart disease with heart failure: Secondary | ICD-10-CM | POA: Diagnosis present

## 2017-01-02 DIAGNOSIS — G894 Chronic pain syndrome: Secondary | ICD-10-CM | POA: Diagnosis present

## 2017-01-02 HISTORY — DX: Unspecified atrial flutter: I48.92

## 2017-01-02 HISTORY — DX: Paroxysmal atrial fibrillation: I48.0

## 2017-01-02 LAB — PROTIME-INR
INR: 2.81
Prothrombin Time: 30.2 seconds — ABNORMAL HIGH (ref 11.4–15.2)

## 2017-01-02 LAB — CBC WITH DIFFERENTIAL/PLATELET
Basophils Absolute: 0 10*3/uL (ref 0.0–0.1)
Basophils Relative: 0 %
Eosinophils Absolute: 0 10*3/uL (ref 0.0–0.7)
Eosinophils Relative: 0 %
HCT: 44 % (ref 36.0–46.0)
Hemoglobin: 15.1 g/dL — ABNORMAL HIGH (ref 12.0–15.0)
Lymphocytes Relative: 49 %
Lymphs Abs: 5.6 10*3/uL — ABNORMAL HIGH (ref 0.7–4.0)
MCH: 31.2 pg (ref 26.0–34.0)
MCHC: 34.3 g/dL (ref 30.0–36.0)
MCV: 90.9 fL (ref 78.0–100.0)
Monocytes Absolute: 0.7 10*3/uL (ref 0.1–1.0)
Monocytes Relative: 6 %
Neutro Abs: 5.1 10*3/uL (ref 1.7–7.7)
Neutrophils Relative %: 45 %
Platelets: 194 10*3/uL (ref 150–400)
RBC: 4.84 MIL/uL (ref 3.87–5.11)
RDW: 13.2 % (ref 11.5–15.5)
WBC: 11.5 10*3/uL — ABNORMAL HIGH (ref 4.0–10.5)

## 2017-01-02 LAB — COMPREHENSIVE METABOLIC PANEL
ALT: 24 U/L (ref 14–54)
AST: 33 U/L (ref 15–41)
Albumin: 3.8 g/dL (ref 3.5–5.0)
Alkaline Phosphatase: 43 U/L (ref 38–126)
Anion gap: 12 (ref 5–15)
BUN: 12 mg/dL (ref 6–20)
CO2: 23 mmol/L (ref 22–32)
Calcium: 8.8 mg/dL — ABNORMAL LOW (ref 8.9–10.3)
Chloride: 104 mmol/L (ref 101–111)
Creatinine, Ser: 0.68 mg/dL (ref 0.44–1.00)
GFR calc Af Amer: >60 mL/min (ref >60)
GFR calc non Af Amer: >60 mL/min (ref >60)
Glucose, Bld: 127 mg/dL — ABNORMAL HIGH (ref 65–99)
Potassium: 3.8 mmol/L (ref 3.5–5.1)
Sodium: 139 mmol/L (ref 135–145)
Total Bilirubin: 0.3 mg/dL (ref 0.3–1.2)
Total Protein: 5.9 g/dL — ABNORMAL LOW (ref 6.5–8.1)

## 2017-01-02 LAB — BASIC METABOLIC PANEL
Anion gap: 9 (ref 5–15)
BUN: 11 mg/dL (ref 6–20)
CO2: 25 mmol/L (ref 22–32)
Calcium: 9.1 mg/dL (ref 8.9–10.3)
Chloride: 102 mmol/L (ref 101–111)
Creatinine, Ser: 0.68 mg/dL (ref 0.44–1.00)
GFR calc Af Amer: >60 mL/min (ref >60)
GFR calc non Af Amer: >60 mL/min (ref >60)
Glucose, Bld: 104 mg/dL — ABNORMAL HIGH (ref 65–99)
Potassium: 4.3 mmol/L (ref 3.5–5.1)
Sodium: 136 mmol/L (ref 135–145)

## 2017-01-02 LAB — TSH: TSH: 1.866 u[IU]/mL (ref 0.350–4.500)

## 2017-01-02 LAB — I-STAT TROPONIN, ED: Troponin i, poc: 0.51 ng/mL (ref 0.00–0.08)

## 2017-01-02 LAB — MAGNESIUM: Magnesium: 2 mg/dL (ref 1.7–2.4)

## 2017-01-02 LAB — APTT: aPTT: 38 seconds — ABNORMAL HIGH (ref 24–36)

## 2017-01-02 MED ORDER — ISOSORBIDE MONONITRATE ER 30 MG PO TB24
30.0000 mg | ORAL_TABLET | Freq: Every day | ORAL | Status: DC
  Administered 2017-01-04: 30 mg via ORAL
  Filled 2017-01-02: qty 1

## 2017-01-02 MED ORDER — SIMVASTATIN 20 MG PO TABS
20.0000 mg | ORAL_TABLET | Freq: Every day | ORAL | Status: DC
  Administered 2017-01-02 – 2017-01-03 (×2): 20 mg via ORAL
  Filled 2017-01-02 (×2): qty 1

## 2017-01-02 MED ORDER — DEXTROSE 5 % IV SOLN
5.0000 mg/h | INTRAVENOUS | Status: DC | PRN
Start: 2017-01-02 — End: 2017-01-03
  Administered 2017-01-02: 5 mg/h via INTRAVENOUS
  Filled 2017-01-02: qty 100

## 2017-01-02 MED ORDER — OMEGA-3-ACID ETHYL ESTERS 1 G PO CAPS
1.0000 g | ORAL_CAPSULE | Freq: Two times a day (BID) | ORAL | Status: DC
  Administered 2017-01-02 – 2017-01-04 (×3): 1 g via ORAL
  Filled 2017-01-02 (×3): qty 1

## 2017-01-02 MED ORDER — ACETAMINOPHEN 325 MG PO TABS
650.0000 mg | ORAL_TABLET | ORAL | Status: DC | PRN
  Administered 2017-01-02 – 2017-01-03 (×2): 650 mg via ORAL
  Filled 2017-01-02 (×2): qty 2

## 2017-01-02 MED ORDER — EZETIMIBE 10 MG PO TABS
10.0000 mg | ORAL_TABLET | Freq: Every day | ORAL | Status: DC
  Administered 2017-01-02 – 2017-01-03 (×2): 10 mg via ORAL
  Filled 2017-01-02 (×3): qty 1

## 2017-01-02 MED ORDER — WARFARIN SODIUM 2.5 MG PO TABS
2.5000 mg | ORAL_TABLET | Freq: Once | ORAL | Status: AC
  Administered 2017-01-02: 2.5 mg via ORAL
  Filled 2017-01-02: qty 1

## 2017-01-02 MED ORDER — WARFARIN - PHARMACIST DOSING INPATIENT
Freq: Every day | Status: DC
  Administered 2017-01-02: 18:00:00

## 2017-01-02 MED ORDER — DILTIAZEM LOAD VIA INFUSION
10.0000 mg | Freq: Once | INTRAVENOUS | Status: DC
  Filled 2017-01-02: qty 10

## 2017-01-02 MED ORDER — SODIUM CHLORIDE 0.9 % IV SOLN
INTRAVENOUS | Status: DC
  Administered 2017-01-03: 19:00:00 via INTRAVENOUS

## 2017-01-02 MED ORDER — DILTIAZEM HCL 100 MG IV SOLR
5.0000 mg/h | INTRAVENOUS | Status: DC
  Filled 2017-01-02: qty 100

## 2017-01-02 MED ORDER — ASPIRIN EC 81 MG PO TBEC
81.0000 mg | DELAYED_RELEASE_TABLET | Freq: Every day | ORAL | Status: DC
  Administered 2017-01-02 – 2017-01-04 (×3): 81 mg via ORAL
  Filled 2017-01-02 (×3): qty 1

## 2017-01-02 MED ORDER — ONDANSETRON HCL 4 MG/2ML IJ SOLN: 4.0000 mg | Freq: Four times a day (QID) | INTRAMUSCULAR | Status: DC | PRN

## 2017-01-02 MED ORDER — LISINOPRIL 10 MG PO TABS
10.0000 mg | ORAL_TABLET | Freq: Every day | ORAL | Status: DC
  Administered 2017-01-04: 10 mg via ORAL
  Filled 2017-01-02: qty 1

## 2017-01-02 MED ORDER — METOPROLOL SUCCINATE ER 50 MG PO TB24
50.0000 mg | ORAL_TABLET | Freq: Every day | ORAL | Status: DC
  Administered 2017-01-02 – 2017-01-04 (×2): 50 mg via ORAL
  Filled 2017-01-02 (×2): qty 1

## 2017-01-02 MED ORDER — VALACYCLOVIR HCL 500 MG PO TABS
500.0000 mg | ORAL_TABLET | Freq: Every day | ORAL | Status: DC
  Filled 2017-01-02 (×2): qty 1

## 2017-01-02 MED ORDER — METOPROLOL TARTRATE 5 MG/5ML IV SOLN
10.0000 mg | Freq: Once | INTRAVENOUS | Status: AC
  Administered 2017-01-02: 10 mg via INTRAVENOUS
  Filled 2017-01-02: qty 10

## 2017-01-02 NOTE — ED Notes (Signed)
Return call from Misty Stanley NP, ok to give IV Diltiazem.
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
## 2017-01-02 NOTE — Progress Notes (Signed)
ANTICOAGULATION CONSULT NOTE - Initial Consult  Pharmacy Consult for warfarin Indication: atrial fibrillation  Allergies  Allergen Reactions  . Crestor [Rosuvastatin]     Leg pain  . Diltiazem     Weakness on oral Dilt  . Lipitor [Atorvastatin]     Leg pain  . Penicillins Other (See Comments)    Blurred vision  . Phenergan [Promethazine Hcl] Other (See Comments)    Nervous Leg / Restless Leg Syndrome  . Promethazine Other (See Comments)    Uncontrolled leg shaking  . Reclast [Zoledronic Acid] Other (See Comments)    Flu symptoms - made pt very sick, and had inflammation  In her eye  . Latex Rash  . Sulfa Antibiotics Photosensitivity and Rash    Burning Rash  . Sulfacetamide Sodium Photosensitivity and Rash    Burning Rash    Patient Measurements: Height: '5\' 3"'$  (160 cm) Weight: 159 lb 12.8 oz (72.5 kg) IBW/kg (Calculated) : 52.4  Vital Signs: Temp: 98.1 F (36.7 C) (02/20 1600) Temp Source: Oral (02/20 1600) BP: 104/68 (02/20 1600) Pulse Rate: 114 (02/20 1600)  Labs:  Recent Labs  01/02/17 1240  HGB 15.1*  HCT 44.0  PLT 194  LABPROT 30.2*  INR 2.81  CREATININE 0.68    Estimated Creatinine Clearance: 60.6 mL/min (by C-G formula based on SCr of 0.68 mg/dL).   Assessment: 65 YOF seen in cardiologist's office today with new atrial flutter with RVR. Started on diltiazem IV. On warfarin PTA- home dose is 1.'25mg'$  daily except 2.'5mg'$  on Sundays, Tuesdays and Thursdays. Patient last took warfarin 2/19 in the evening. INR today on admission therapeutic at 2.81. Hgb 15.1, plts 194- no bleeding noted.  Goal of Therapy:  INR 2-3 Monitor platelets by anticoagulation protocol: Yes   Plan:  -Warfarin 2.'5mg'$  po x1 tonight as per home order -Daily INR- if stable, can likely resume home dose of warfarin -Follow for s/s bleeding or changes to medication that could affect warfarin  Erianna Jolly D. Colin Ellers, PharmD, BCPS Clinical Pharmacist Pager: [phone] 01/02/2017 5:09  PM

## 2017-01-02 NOTE — Patient Instructions (Addendum)
   We are admitting you to the hospital.   Call the [place] office at [phone] if you have any questions, problems or concerns.

## 2017-01-02 NOTE — ED Triage Notes (Signed)
Per EMS, patient was being seen at Cardiologist for having SOB and dizziness x 3 days.  EKG showed aflutter or sinus tach.  97 % RA, 140 HR regular, 160/90, RR16. Hx of Afib, hypertension, and previous CABG.  Patient is on blood thinners.  Denies CP.  Allergic to oral cardizem. States it causes weakness.  20G RAC.

## 2017-01-03 DIAGNOSIS — I483 Typical atrial flutter: Principal | ICD-10-CM

## 2017-01-03 DIAGNOSIS — I2510 Atherosclerotic heart disease of native coronary artery without angina pectoris: Secondary | ICD-10-CM | POA: Diagnosis not present

## 2017-01-03 DIAGNOSIS — G894 Chronic pain syndrome: Secondary | ICD-10-CM | POA: Diagnosis present

## 2017-01-03 DIAGNOSIS — M3500 Sicca syndrome, unspecified: Secondary | ICD-10-CM | POA: Diagnosis not present

## 2017-01-03 DIAGNOSIS — I252 Old myocardial infarction: Secondary | ICD-10-CM | POA: Diagnosis not present

## 2017-01-03 DIAGNOSIS — E785 Hyperlipidemia, unspecified: Secondary | ICD-10-CM | POA: Diagnosis not present

## 2017-01-03 DIAGNOSIS — M549 Dorsalgia, unspecified: Secondary | ICD-10-CM | POA: Diagnosis not present

## 2017-01-03 DIAGNOSIS — I4892 Unspecified atrial flutter: Secondary | ICD-10-CM | POA: Diagnosis not present

## 2017-01-03 DIAGNOSIS — Z880 Allergy status to penicillin: Secondary | ICD-10-CM | POA: Diagnosis not present

## 2017-01-03 DIAGNOSIS — I959 Hypotension, unspecified: Secondary | ICD-10-CM | POA: Diagnosis not present

## 2017-01-03 DIAGNOSIS — Z85828 Personal history of other malignant neoplasm of skin: Secondary | ICD-10-CM | POA: Diagnosis not present

## 2017-01-03 DIAGNOSIS — Z8249 Family history of ischemic heart disease and other diseases of the circulatory system: Secondary | ICD-10-CM | POA: Diagnosis not present

## 2017-01-03 DIAGNOSIS — Z951 Presence of aortocoronary bypass graft: Secondary | ICD-10-CM | POA: Diagnosis not present

## 2017-01-03 DIAGNOSIS — I272 Pulmonary hypertension, unspecified: Secondary | ICD-10-CM | POA: Diagnosis not present

## 2017-01-03 DIAGNOSIS — I739 Peripheral vascular disease, unspecified: Secondary | ICD-10-CM | POA: Diagnosis not present

## 2017-01-03 DIAGNOSIS — Z7982 Long term (current) use of aspirin: Secondary | ICD-10-CM | POA: Diagnosis not present

## 2017-01-03 DIAGNOSIS — I480 Paroxysmal atrial fibrillation: Secondary | ICD-10-CM | POA: Diagnosis not present

## 2017-01-03 DIAGNOSIS — Z881 Allergy status to other antibiotic agents status: Secondary | ICD-10-CM | POA: Diagnosis not present

## 2017-01-03 DIAGNOSIS — Z888 Allergy status to other drugs, medicaments and biological substances status: Secondary | ICD-10-CM | POA: Diagnosis not present

## 2017-01-03 DIAGNOSIS — I110 Hypertensive heart disease with heart failure: Secondary | ICD-10-CM | POA: Diagnosis not present

## 2017-01-03 DIAGNOSIS — I5022 Chronic systolic (congestive) heart failure: Secondary | ICD-10-CM | POA: Diagnosis not present

## 2017-01-03 DIAGNOSIS — Z7901 Long term (current) use of anticoagulants: Secondary | ICD-10-CM | POA: Diagnosis not present

## 2017-01-03 DIAGNOSIS — Z79899 Other long term (current) drug therapy: Secondary | ICD-10-CM | POA: Diagnosis not present

## 2017-01-03 DIAGNOSIS — F419 Anxiety disorder, unspecified: Secondary | ICD-10-CM | POA: Diagnosis not present

## 2017-01-03 DIAGNOSIS — I248 Other forms of acute ischemic heart disease: Secondary | ICD-10-CM | POA: Diagnosis not present

## 2017-01-03 DIAGNOSIS — Z87891 Personal history of nicotine dependence: Secondary | ICD-10-CM | POA: Diagnosis not present

## 2017-01-03 LAB — PROTIME-INR
INR: 3.17
Prothrombin Time: 33.2 seconds — ABNORMAL HIGH (ref 11.4–15.2)

## 2017-01-03 LAB — CBC
HCT: 40.3 % (ref 36.0–46.0)
Hemoglobin: 13.5 g/dL (ref 12.0–15.0)
MCH: 30.4 pg (ref 26.0–34.0)
MCHC: 33.5 g/dL (ref 30.0–36.0)
MCV: 90.8 fL (ref 78.0–100.0)
Platelets: 165 10*3/uL (ref 150–400)
RBC: 4.44 MIL/uL (ref 3.87–5.11)
RDW: 13.2 % (ref 11.5–15.5)
WBC: 10.1 10*3/uL (ref 4.0–10.5)

## 2017-01-03 LAB — TROPONIN I
Troponin I: 0.25 ng/mL (ref <0.03)
Troponin I: 0.16 ng/mL (ref <0.03)

## 2017-01-03 MED ORDER — SODIUM CHLORIDE 0.9% FLUSH: 3.0000 mL | Freq: Two times a day (BID) | INTRAVENOUS | Status: DC

## 2017-01-03 MED ORDER — SODIUM CHLORIDE 0.9% FLUSH: 3.0000 mL | INTRAVENOUS | Status: DC | PRN

## 2017-01-03 MED ORDER — SODIUM CHLORIDE 0.9 % IV BOLUS (SEPSIS)
250.0000 mL | Freq: Once | INTRAVENOUS | Status: AC
  Administered 2017-01-03: 250 mL via INTRAVENOUS

## 2017-01-03 MED ORDER — SODIUM CHLORIDE 0.9 % IV SOLN: 250.0000 mL | INTRAVENOUS | Status: DC

## 2017-01-03 NOTE — Anesthesia Preprocedure Evaluation (Addendum)
Anesthesia Evaluation  Patient identified by MRN, date of birth, ID band Patient awake    Reviewed: Allergy & Precautions, H&P , NPO status , Patient's Chart, lab work & pertinent test results, reviewed documented beta blocker date and time   History of Anesthesia Complications (+) Emergence Delirium  Airway Mallampati: II  TM Distance: >3 FB Neck ROM: Full    Dental no notable dental hx. (+) Teeth Intact, Dental Advisory Given   Pulmonary sleep apnea , former smoker,    Pulmonary exam normal breath sounds clear to auscultation       Cardiovascular Exercise Tolerance: Good hypertension, Pt. on medications and Pt. on home beta blockers + CAD, + Peripheral Vascular Disease and +CHF  + dysrhythmias Atrial Fibrillation  Rhythm:Regular Rate:Tachycardia     Neuro/Psych  Headaches, Seizures -,  Anxiety negative psych ROS   GI/Hepatic negative GI ROS, Neg liver ROS,   Endo/Other  negative endocrine ROS  Renal/GU negative Renal ROS  negative genitourinary   Musculoskeletal  (+) Arthritis , Osteoarthritis,    Abdominal   Peds  Hematology negative hematology ROS (+) anemia ,   Anesthesia Other Findings   Reproductive/Obstetrics negative OB ROS                           Anesthesia Physical Anesthesia Plan  ASA: III  Anesthesia Plan: General   Post-op Pain Management:    Induction: Intravenous  Airway Management Planned: Mask  Additional Equipment:   Intra-op Plan:   Post-operative Plan:   Informed Consent: I have reviewed the patients History and Physical, chart, labs and discussed the procedure including the risks, benefits and alternatives for the proposed anesthesia with the patient or authorized representative who has indicated his/her understanding and acceptance.   Dental advisory given  Plan Discussed with: CRNA  Anesthesia Plan Comments:         Anesthesia Quick  Evaluation

## 2017-01-03 NOTE — Progress Notes (Signed)
Progress Note  Patient Name: [name] Date of Encounter: 01/03/2017  Primary Cardiologist: Dr. Claiborne Billings  Subjective   She had hypotension with some generalized fatigue on IV metoprolol the diltiazem for her afib with RVR now off medication with HR 130s and SBP 100s. Dyspnea is improved resting comfortably. NPO since midnight for possible cardioversion.  Vital Signs    Vitals:   01/03/17 0204 01/03/17 0326 01/03/17 0605 01/03/17 0917  BP: (!) 83/65 103/79  (!) 103/50  Pulse: (!) 130 (!) 112 (!) 114 (!) 136  Resp:  20    Temp:  98.9 F (37.2 C)    TempSrc:  Oral    SpO2:  100%  99%  Weight:      Height:        Intake/Output Summary (Last 24 hours) at 01/03/17 1206 Last data filed at 01/03/17 1000  Gross per 24 hour  Intake              300 ml  Output                0 ml  Net              300 ml   Filed Weights   01/02/17 1237 01/02/17 1600  Weight: 161 lb (73 kg) 159 lb 12.8 oz (72.5 kg)     Physical Exam   General: Well developed, female appearing in no acute distress Head: Normocephalic, atraumatic Neck: No bruits, no JVD Lungs:  Resp regular and unlabored, CTAB Heart: Tachycardic, irregular, no murmur, no rub Abdomen: Soft, non-tender, non-distended Extremities: No peripheral edema. DP/Radial pulses are 2+ bilaterally Neuro: Alert and oriented X 3. Moves all extremities spontaneously Psych: Normal affect  Labs    Chemistry  Recent Labs Lab 01/02/17 1240 01/02/17 1438  NA 136 139  K 4.3 3.8  CL 102 104  CO2 25 23  GLUCOSE 104* 127*  BUN 11 12  CREATININE 0.68 0.68  CALCIUM 9.1 8.8*  PROT  --  5.9*  ALBUMIN  --  3.8  AST  --  33  ALT  --  24  ALKPHOS  --  43  BILITOT  --  0.3  GFRNONAA >60 >60  GFRAA >60 >60  ANIONGAP 9 12     Hematology  Recent Labs Lab 01/02/17 1240 01/03/17 0326  WBC 11.5* 10.1  RBC 4.84 4.44  HGB 15.1* 13.5  HCT 44.0 40.3  MCV 90.9 90.8  MCH 31.2 30.4  MCHC 34.3 33.5  RDW 13.2 13.2  PLT 194 165     Cardiac Enzymes  Recent Labs Lab 01/03/17 0326 01/03/17 0952  TROPONINI 0.25* 0.16*     Recent Labs Lab 01/02/17 1255  TROPIPOC 0.51*    Radiology    01/02/17 Dg Chest 2 View:  CLINICAL DATA:  Palpitations, weakness and fatigue.  EXAM: CHEST  2 VIEW COMPARISON:  03/16/2015.  FINDINGS: Trachea is midline. Heart size normal. Thoracic aorta is calcified. Lungs are clear. No pleural fluid. Lower thoracic compression deformities are unchanged.  IMPRESSION: No acute findings.   Telemetry    Coarse afib/aflutter with rate 120s-140s overnight - Personally Reviewed  ECG    Aflutter with ventricular rate 139, no significant ST abnormality - Personally Reviewed  Patient Profile     71 y.o. woman with paroxysmal afib on coumadin, hypertension, hyperlipidemia, CAD s/p CABG in 2008 LIMA to LAD, SVG to DX and SVG to PD most recent myoview 2015 low risk with a tiny area  of basal anteroseptal ischemia presented to ED shortly after clinic visit yesterday with lightheadedness, SOB, and chest pain.  Assessment & Plan    1. Atrial flutter with RVR: Paroxysmal afib since 2014 with what sounds like increasing prevalence. Echo in 11/2016 showing good EF but some abnormalities increased PA pressure, afib, septal aneurysm. Feels better this morning but remains significantly tachycardic. Off all rate controlling medication 2/2 hypotension so a rhythm controlling strategy may be needed now. She has never been on amiodarone. She is slightly supertherapeutic INR. -She would be a candidate for DCCV today  2. CAD s/p CABG: No ECG changes suggesting acute ischemia and troponin elevation more likely 2/2 demand with RVR. Very recent echo without new or focal deficits. -On ASA, statin, imdur/lisinopril/metoprolol held today for hypotension  3. HTN: Currently low  4. HLD: On zetia '10mg'$ , simvastatin '20mg'$    Collier Salina, MD PGY-II Internal Medicine Resident Pager# [phone] 01/03/2017,  12:06 PM  I have seen and examined the patient along with Collier Salina, MD.  I have reviewed the chart, notes and new data.  I agree withhis note.  Key new complaints: feels OK at rest Key examination changes: rate control remains poor Key new findings / data: troponin elevation is indeed consistent with demand ischemia  PLAN: Unfortunately, unable to schedule for DCCV today. She appears stable (at least at rest). Will plan for elective DCCV tomorrow at 10AM with Dr. Stanford Breed.  Sanda Klein, MD, [place] [phone] 01/03/2017, 12:36 PM

## 2017-01-03 NOTE — Progress Notes (Signed)
Trop. Remains high but better than before Trop 0.25

## 2017-01-03 NOTE — Progress Notes (Signed)
Patient B.P. 83/65 H.R. 104-130 At. Flutter Patient voiced no complaints.skin warm and dry. R.N. Aware and Dr. Eula Fried called and aware. See orders.

## 2017-01-03 NOTE — Progress Notes (Signed)
Patient resting with no complaints.B.P. 73/41 reck. it 73/55 Stopped Cardizem Drip R.N. Aware and Dr. Eula Fried page and made aware. Stop Drip and call back if B.P. Does not come backup . Cont. To Monitor patient and Rhythm.

## 2017-01-03 NOTE — Care Management Obs Status (Signed)
[facility] NOTIFICATION   Patient Details  Name: [name] MRN: [id number] Date of Birth: March 02, 1946   Medicare Observation Status Notification Given:  Yes  Questions answered, patient disagreed with status. Patient refused to signed. Noted on form, copy left at bedside.     Carles Collet, RN 01/03/2017, 5:41 PM

## 2017-01-03 NOTE — Progress Notes (Signed)
ANTICOAGULATION CONSULT NOTE - Follow Up Consult  Pharmacy Consult for warfarin Indication: atrial fibrillation  Allergies  Allergen Reactions  . Crestor [Rosuvastatin]     Leg pain  . Diltiazem     Weakness on oral Dilt  . Lipitor [Atorvastatin]     Leg pain  . Penicillins Other (See Comments)    Blurred vision  . Phenergan [Promethazine Hcl] Other (See Comments)    Nervous Leg / Restless Leg Syndrome  . Promethazine Other (See Comments)    Uncontrolled leg shaking  . Reclast [Zoledronic Acid] Other (See Comments)    Flu symptoms - made pt very sick, and had inflammation  In her eye  . Latex Rash  . Sulfa Antibiotics Photosensitivity and Rash    Burning Rash  . Sulfacetamide Sodium Photosensitivity and Rash    Burning Rash   Patient Measurements: Height: '5\' 3"'$  (160 cm) Weight: 159 lb 12.8 oz (72.5 kg) IBW/kg (Calculated) : 52.4  Vital Signs: Temp: 98.9 F (37.2 C) (02/21 0326) Temp Source: Oral (02/21 0326) BP: 103/50 (02/21 0917) Pulse Rate: 136 (02/21 0917)  Labs:  Recent Labs  01/02/17 1240 01/02/17 1438 01/03/17 0326 01/03/17 0952  HGB 15.1*  --  13.5  --   HCT 44.0  --  40.3  --   PLT 194  --  165  --   APTT  --  38*  --   --   LABPROT 30.2*  --  33.2*  --   INR 2.81  --  3.17  --   CREATININE 0.68 0.68  --   --   TROPONINI  --   --  0.25* 0.16*   Estimated Creatinine Clearance: 60.6 mL/min (by C-G formula based on SCr of 0.68 mg/dL).  Assessment: 18 YOF seen in cardiologist's office today with new atrial flutter with RVR. Started on diltiazem IV. On warfarin PTA- home dose is 1.'25mg'$  daily except 2.'5mg'$  on Sundays, Tuesdays and Thursdays.  INR therapeutic at time of admission. Hgb 13.5, plts 165- no bleeding noted.  INR today supratherapeutic: 3.17   Goal of Therapy:  INR 2-3 Monitor platelets by anticoagulation protocol: Yes  Plan:  -Warfarin on hold tonight -Daily INR -Follow for s/s bleeding or changes to medication that could affect  warfarin  Georga Bora, PharmD Clinical Pharmacist Pager: [phone] 01/03/2017 11:47 AM

## 2017-01-04 ENCOUNTER — Other Ambulatory Visit: Payer: Self-pay | Admitting: Physician Assistant

## 2017-01-04 ENCOUNTER — Encounter (HOSPITAL_COMMUNITY)
Admission: EM | Disposition: A | Discharge status: Home / Self Care | Payer: Self-pay | Source: Home / Self Care | Attending: Cardiology

## 2017-01-04 ENCOUNTER — Inpatient Hospital Stay (HOSPITAL_COMMUNITY): Payer: Medicare Other | Admitting: Anesthesiology

## 2017-01-04 ENCOUNTER — Encounter (HOSPITAL_COMMUNITY): Payer: Self-pay | Admitting: *Deleted

## 2017-01-04 DIAGNOSIS — I2510 Atherosclerotic heart disease of native coronary artery without angina pectoris: Secondary | ICD-10-CM

## 2017-01-04 DIAGNOSIS — I4892 Unspecified atrial flutter: Secondary | ICD-10-CM

## 2017-01-04 DIAGNOSIS — R7989 Other specified abnormal findings of blood chemistry: Secondary | ICD-10-CM

## 2017-01-04 DIAGNOSIS — R778 Other specified abnormalities of plasma proteins: Secondary | ICD-10-CM

## 2017-01-04 DIAGNOSIS — R079 Chest pain, unspecified: Secondary | ICD-10-CM

## 2017-01-04 HISTORY — PX: CARDIOVERSION: SHX1299

## 2017-01-04 LAB — PROTIME-INR
INR: 2.83
Prothrombin Time: 30.4 seconds — ABNORMAL HIGH (ref 11.4–15.2)

## 2017-01-04 LAB — CBC
HCT: 40.8 % (ref 36.0–46.0)
Hemoglobin: 13.7 g/dL (ref 12.0–15.0)
MCH: 30.6 pg (ref 26.0–34.0)
MCHC: 33.6 g/dL (ref 30.0–36.0)
MCV: 91.3 fL (ref 78.0–100.0)
Platelets: 159 10*3/uL (ref 150–400)
RBC: 4.47 MIL/uL (ref 3.87–5.11)
RDW: 13.1 % (ref 11.5–15.5)
WBC: 8.6 10*3/uL (ref 4.0–10.5)

## 2017-01-04 SURGERY — CARDIOVERSION
Anesthesia: General

## 2017-01-04 MED ORDER — SODIUM CHLORIDE 0.9 % IV SOLN
INTRAVENOUS | Status: DC | PRN
  Administered 2017-01-04: 10:00:00 via INTRAVENOUS

## 2017-01-04 MED ORDER — WARFARIN SODIUM 2.5 MG PO TABS: 1.2500 mg | ORAL_TABLET | Freq: Once | ORAL | Status: DC

## 2017-01-04 MED ORDER — METOPROLOL SUCCINATE ER 50 MG PO TB24: 50.0000 mg | ORAL_TABLET | Freq: Every day | ORAL | 3 refills | Status: DC

## 2017-01-04 MED ORDER — LIDOCAINE HCL (CARDIAC) 20 MG/ML IV SOLN
INTRAVENOUS | Status: DC | PRN
  Administered 2017-01-04: 60 mg via INTRAVENOUS

## 2017-01-04 MED ORDER — PROPOFOL 10 MG/ML IV BOLUS
INTRAVENOUS | Status: DC | PRN
  Administered 2017-01-04: 50 mg via INTRAVENOUS

## 2017-01-04 NOTE — Progress Notes (Addendum)
Order received to discharge patient.  Telemetry monitor removed and CCMD notified.  PIV access removed.  Discharge instructions, follow up, medications and instructions for their use discussed with patient. 
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
## 2017-01-04 NOTE — Procedures (Signed)
Electrical Cardioversion Procedure Note [name] [id number] 08-07-1946  Procedure: Electrical Cardioversion Indications:  Atrial Flutter  Procedure Details Consent: Risks of procedure as well as the alternatives and risks of each were explained to the (patient/caregiver).  Consent for procedure obtained. Time Out: Verified patient identification, verified procedure, site/side was marked, verified correct patient position, special equipment/implants available, medications/allergies/relevent history reviewed, required imaging and test results available.  Performed  Patient placed on cardiac monitor, pulse oximetry, supplemental oxygen as necessary.  Sedation given: Pt sedated by anesthesia with lidocaine 60 mg and diprovan 50 mg IV. Pacer pads placed anterior and posterior chest.  Cardioverted 1 time(s).  Cardioverted at 120J.  Evaluation Findings: Post procedure EKG shows: NSR Complications: None Patient did tolerate procedure well.   Kirk Ruths 01/04/2017, 8:50 AM

## 2017-01-04 NOTE — Transfer of Care (Signed)
Immediate Anesthesia Transfer of Care Note  Patient: [name]  Procedure(s) Performed: Procedure(s): CARDIOVERSION (N/A)  Patient Location: Endoscopy Unit  Anesthesia Type:General  Level of Consciousness: awake, alert , oriented and sedated  Airway & Oxygen Therapy: Patient Spontanous Breathing  Post-op Assessment: Report given to RN, Post -op Vital signs reviewed and stable and Patient moving all extremities X 4  Post vital signs: Reviewed and stable  Last Vitals:  Vitals:   01/04/17 0440 01/04/17 0915  BP: 129/80 (!) 144/87  Pulse: (!) 132 (!) 140  Resp: 18 20  Temp: 36.7 C     Last Pain:  Vitals:   01/04/17 0915  TempSrc: Oral  PainSc: 4       Patients Stated Pain Goal: 2 (75/10/25 8527)  Complications: No apparent anesthesia complications

## 2017-01-04 NOTE — Progress Notes (Signed)
ANTICOAGULATION CONSULT NOTE - Follow Up Consult  Pharmacy Consult for warfarin Indication: atrial fibrillation  Allergies  Allergen Reactions  . Crestor [Rosuvastatin]     Leg pain  . Diltiazem     Weakness on oral Dilt  . Lipitor [Atorvastatin]     Leg pain  . Penicillins Other (See Comments)    Blurred vision  . Phenergan [Promethazine Hcl] Other (See Comments)    Nervous Leg / Restless Leg Syndrome  . Promethazine Other (See Comments)    Uncontrolled leg shaking  . Reclast [Zoledronic Acid] Other (See Comments)    Flu symptoms - made pt very sick, and had inflammation  In her eye  . Latex Rash  . Sulfa Antibiotics Photosensitivity and Rash    Burning Rash  . Sulfacetamide Sodium Photosensitivity and Rash    Burning Rash   Patient Measurements: Height: '5\' 3"'$  (160 cm) Weight: 159 lb 12.8 oz (72.5 kg) IBW/kg (Calculated) : 52.4  Vital Signs: Temp: 97.7 F (36.5 C) (02/22 1035) Temp Source: Oral (02/22 1035) BP: 147/97 (02/22 1055) Pulse Rate: 74 (02/22 1055)  Labs:  Recent Labs  01/02/17 1240 01/02/17 1438 01/03/17 0326 01/03/17 0952 01/04/17 0343  HGB 15.1*  --  13.5  --  13.7  HCT 44.0  --  40.3  --  40.8  PLT 194  --  165  --  159  APTT  --  38*  --   --   --   LABPROT 30.2*  --  33.2*  --  30.4*  INR 2.81  --  3.17  --  2.83  CREATININE 0.68 0.68  --   --   --   TROPONINI  --   --  0.25* 0.16*  --    Estimated Creatinine Clearance: 60.6 mL/min (by C-G formula based on SCr of 0.68 mg/dL).  Assessment: 58 YOF seen in cardiologist's office today with new atrial flutter with RVR. Cardioversion performed 2/22 and the diltiazem IV has been discontinued. INR therapeutic at time of admission, however warfarin held 2/21 for supratherapeutic INR. CBC stable and no reported bleeding. Will resume home warfarin regimen.   On warfarin PTA- home dose is 1.'25mg'$  daily except 2.'5mg'$  on Sundays, Tuesdays and Thursdays.  INR therapeutic: 2.83  Goal of Therapy:  INR  2-3 Monitor platelets by anticoagulation protocol: Yes  Plan:  -Warfarin 1.25 mg x1 tonight -Daily INR -Follow for s/s bleeding or changes to medication that could affect warfarin  Georga Bora, PharmD Clinical Pharmacist Pager: [phone] 01/04/2017 11:23 AM

## 2017-01-04 NOTE — Progress Notes (Signed)
Progress Note  Patient Name: [name] Date of Encounter: 01/04/2017  Primary Cardiologist: Claiborne Billings  Subjective   Had successful cardioversion and immediately felt better. No complications.  Inpatient Medications    Scheduled Meds: . aspirin EC  81 mg Oral Daily  . ezetimibe  10 mg Oral Daily  . isosorbide mononitrate  30 mg Oral Daily  . lisinopril  10 mg Oral Daily  . metoprolol succinate  50 mg Oral Daily  . omega-3 acid ethyl esters  1 g Oral BID  . simvastatin  20 mg Oral QHS  . sodium chloride flush  3 mL Intravenous Q12H  . valACYclovir  500 mg Oral Daily  . warfarin  1.25 mg Oral ONCE-1800  . Warfarin - Pharmacist Dosing Inpatient   Does not apply q1800   Continuous Infusions: . sodium chloride 10 mL/hr at 01/03/17 1832  . sodium chloride     PRN Meds: acetaminophen, ondansetron (ZOFRAN) IV, sodium chloride flush   Vital Signs    Vitals:   01/04/17 1040 01/04/17 1045 01/04/17 1050 01/04/17 1055  BP: 138/67 132/73 130/63 (!) 147/97  Pulse: 71 69 71 74  Resp: 17 (!) '21 17 17  '$ Temp:      TempSrc:      SpO2: 98% 99% 99% 99%  Weight:      Height:        Intake/Output Summary (Last 24 hours) at 01/04/17 1321 Last data filed at 01/04/17 0800  Gross per 24 hour  Intake                0 ml  Output                0 ml  Net                0 ml   Filed Weights   01/02/17 1237 01/02/17 1600  Weight: 73 kg (161 lb) 72.5 kg (159 lb 12.8 oz)    Telemetry    NSR since DCCV - Personally Reviewed QTC in NSR 474 ms  ECG    NSR - Personally Reviewed  Physical Exam  Comfortable GEN: No acute distress.   Neck: No JVD Cardiac: RRR, no murmurs, rubs, or gallops.  Respiratory: Clear to auscultation bilaterally. GI: Soft, nontender, non-distended  MS: No edema; No deformity. Neuro:  Nonfocal  Psych: Normal affect   Labs    Chemistry Recent Labs Lab 01/02/17 1240 01/02/17 1438  NA 136 139  K 4.3 3.8  CL 102 104  CO2 25 23  GLUCOSE 104* 127*    BUN 11 12  CREATININE 0.68 0.68  CALCIUM 9.1 8.8*  PROT  --  5.9*  ALBUMIN  --  3.8  AST  --  33  ALT  --  24  ALKPHOS  --  43  BILITOT  --  0.3  GFRNONAA >60 >60  GFRAA >60 >60  ANIONGAP 9 12     Hematology Recent Labs Lab 01/02/17 1240 01/03/17 0326 01/04/17 0343  WBC 11.5* 10.1 8.6  RBC 4.84 4.44 4.47  HGB 15.1* 13.5 13.7  HCT 44.0 40.3 40.8  MCV 90.9 90.8 91.3  MCH 31.2 30.4 30.6  MCHC 34.3 33.5 33.6  RDW 13.2 13.2 13.1  PLT 194 165 159    Cardiac Enzymes Recent Labs Lab 01/03/17 0326 01/03/17 0952  TROPONINI 0.25* 0.16*    Recent Labs Lab 01/02/17 1255  TROPIPOC 0.51*     BNPNo results for input(s): BNP, PROBNP in  the last 168 hours.   DDimer No results for input(s): DDIMER in the last 168 hours.   Radiology    Dg Chest 2 View  Result Date: 01/02/2017 CLINICAL DATA:  Palpitations, weakness and fatigue. EXAM: CHEST  2 VIEW COMPARISON:  03/16/2015. FINDINGS: Trachea is midline. Heart size normal. Thoracic aorta is calcified. Lungs are clear. No pleural fluid. Lower thoracic compression deformities are unchanged. IMPRESSION: No acute findings. Electronically Signed   By: Lorin Picket M.D.   On: 01/02/2017 13:32    Cardiac Studies   Procedure: Electrical Cardioversion Indications:  Atrial Flutter  Procedure Details Consent: Risks of procedure as well as the alternatives and risks of each were explained to the (patient/caregiver).  Consent for procedure obtained. Time Out: Verified patient identification, verified procedure, site/side was marked, verified correct patient position, special equipment/implants available, medications/allergies/relevent history reviewed, required imaging and test results available.  Performed  Patient placed on cardiac monitor, pulse oximetry, supplemental oxygen as necessary.  Sedation given: Pt sedated by anesthesia with lidocaine 60 mg and diprovan 50 mg IV. Pacer pads placed anterior and posterior  chest.  Cardioverted 1 time(s).  Cardioverted at 120J.  Evaluation Findings: Post procedure EKG shows: NSR Complications: None Patient did tolerate procedure well.   Patient Profile     71 y.o. female with paroxysmal afib on coumadin, hypertension, hyperlipidemia, CAD s/p CABG in 2008 presenting with symptomatic atrial flutter with 2:1 AV block, difficult rate control due to low BP, now s/p DCCV.  Assessment & Plan    1. AFlutter; get old records documenting "AFib" since this would impact antiarrhythmic/ablation approach compared to atrial flutter. 30 day event monitor. F/U with Dr. Caryl Comes after event monitor. On therapeutic warfarin anticoagulation. CHADSVASc Score of 4 . QTc after cardioversion slightly prolonged at 474 ms. 2. CAD s/p CABG: recheck Myoview (last done in 2015: "tiny area of basal anteroseptal ischemia. Consider high grade LAD stenosis with a patent distal bypass") since presence/absence of significant ischemia will also have impact on antiarrhythmic strategy. Normal LVEF. 3. HTN: prefer to hold ACEi to allow use of higher doses of AV blocking agents if arrhythmia recurs.  DC home with f/u with Dr. Caryl Comes after event monitor and stress test are completed.  Signed, Sanda Klein, MD  01/04/2017, 1:21 PM

## 2017-01-04 NOTE — Progress Notes (Deleted)
Error note type

## 2017-01-04 NOTE — Anesthesia Postprocedure Evaluation (Signed)
Anesthesia Post Note  Patient: [name]  Procedure(s) Performed: Procedure(s) (LRB): CARDIOVERSION (N/A)  Patient location during evaluation: PACU Anesthesia Type: General Level of consciousness: awake and alert Pain management: pain level controlled Vital Signs Assessment: post-procedure vital signs reviewed and stable Respiratory status: spontaneous breathing, nonlabored ventilation and respiratory function stable Cardiovascular status: blood pressure returned to baseline and stable Postop Assessment: no signs of nausea or vomiting Anesthetic complications: no       Last Vitals:  Vitals:   01/04/17 1050 01/04/17 1055  BP: 130/63 (!) 147/97  Pulse: 71 74  Resp: 17 17  Temp:      Last Pain:  Vitals:   01/04/17 1035  TempSrc: Oral  PainSc:                  Normalee Sistare,W. EDMOND
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
## 2017-01-05 ENCOUNTER — Telehealth: Payer: Self-pay | Admitting: Physician Assistant

## 2017-01-05 NOTE — Telephone Encounter (Signed)
New Message:  Close this encounter    Pt was discharged from [facility] yesterday andn.

## 2017-01-08 ENCOUNTER — Telehealth (HOSPITAL_COMMUNITY): Payer: Self-pay | Admitting: *Deleted

## 2017-01-08 ENCOUNTER — Encounter (HOSPITAL_COMMUNITY): Payer: Self-pay | Admitting: Cardiology

## 2017-01-08 NOTE — Telephone Encounter (Signed)
Patient given detailed instructions per Myocardial Perfusion Study Information Sheet for the test on 01/09/18 at 0730. Patient notified to arrive 15 minutes early and that it is imperative to arrive on time for appointment to keep from having the test rescheduled.  If you need to cancel or reschedule your appointment, please call the office within 24 hours of your appointment. Failure to do so may result in a cancellation of your appointment, and a $50 no show fee. Patient verbalized understanding.Brettany Sydney, Ranae Palms

## 2017-01-09 ENCOUNTER — Telehealth: Payer: Self-pay | Admitting: Cardiovascular Disease

## 2017-01-09 ENCOUNTER — Ambulatory Visit (HOSPITAL_COMMUNITY): Payer: Medicare Other | Attending: Cardiovascular Disease

## 2017-01-09 DIAGNOSIS — R748 Abnormal levels of other serum enzymes: Secondary | ICD-10-CM

## 2017-01-09 DIAGNOSIS — I2510 Atherosclerotic heart disease of native coronary artery without angina pectoris: Secondary | ICD-10-CM | POA: Diagnosis not present

## 2017-01-09 DIAGNOSIS — R778 Other specified abnormalities of plasma proteins: Secondary | ICD-10-CM

## 2017-01-09 DIAGNOSIS — R079 Chest pain, unspecified: Secondary | ICD-10-CM | POA: Diagnosis not present

## 2017-01-09 DIAGNOSIS — R7989 Other specified abnormal findings of blood chemistry: Secondary | ICD-10-CM

## 2017-01-09 DIAGNOSIS — I4892 Unspecified atrial flutter: Secondary | ICD-10-CM

## 2017-01-09 MED ORDER — TECHNETIUM TC 99M TETROFOSMIN IV KIT
32.5000 | PACK | Freq: Once | INTRAVENOUS | Status: AC | PRN
  Administered 2017-01-09: 32.5 via INTRAVENOUS
  Filled 2017-01-09: qty 33

## 2017-01-09 MED ORDER — REGADENOSON 0.4 MG/5ML IV SOLN
0.4000 mg | Freq: Once | INTRAVENOUS | Status: AC
  Administered 2017-01-09: 0.4 mg via INTRAVENOUS

## 2017-01-09 MED ORDER — TECHNETIUM TC 99M TETROFOSMIN IV KIT
10.2000 | PACK | Freq: Once | INTRAVENOUS | Status: AC | PRN
  Administered 2017-01-09: 10.2 via INTRAVENOUS
  Filled 2017-01-09: qty 11

## 2017-01-09 NOTE — Telephone Encounter (Signed)
New message    Pt c/o BP issue: STAT if pt c/o blurred vision, one-sided weakness or slurred speech  1. What are your last 5 BP readings? 147/66 158/71 hr-49 167/68 hr-49 138/88 hr-66 150/73 hr-52  2. Are you having any other symptoms (ex. Dizziness, headache, blurred vision, passed out)? no  3. What is your BP issue? Pt states she was told to stop taking lisinopril by Dr. Loletha Grayer while she was in the hospital she states her bp has been high.    She also wanted to follow up about if records had been received.

## 2017-01-09 NOTE — Telephone Encounter (Signed)
lmtcb

## 2017-01-10 LAB — MYOCARDIAL PERFUSION IMAGING
LV dias vol: 85 mL (ref 46–106)
LV sys vol: 29 mL
Peak HR: 71 {beats}/min
RATE: 0.29
Rest HR: 46 {beats}/min
SDS: 1
SRS: 2
SSS: 3
TID: 0.97

## 2017-01-10 NOTE — Telephone Encounter (Signed)
Received call back from patient-patient states since her medications were changed in the hospital her BP has been running high.    Blood pressure readings: 147/66  158/71 hr-49  167/68 hr-49  138/88 hr-66  150/73 hr-52  Verified current medications: patient reports she is taking '100mg'$  of Toprol in the AM-med list states '50mg'$  once daily.    Reports a few HAs and some dizziness since being out of the hospital-unsure if the correlate to BP.    Advised I would route to Dr. Claiborne Billings for recommendations.  Patient also states Dr. Caryl Comes wanted to see her test results from 2014 that are not avaliable in [place]?  Reports there was an echo and some other testing completed when she was diagnosed with Afib/flutter?  Patient wanted to make sure we were working on getting this information to Dr. Caryl Comes.

## 2017-01-10 NOTE — Telephone Encounter (Signed)
Metoprolol succinate (toprol XL)  dose clarified with patient. She is taking 50 mg every morning as instructed.   Current HR  48-55s reported as "normal values" per patient.  [name] stated she is also taking 1/2 table of lisinopril '20mg'$  dut to elevated BP 148-150s while sedentary.  Refuse follow up with HTN pharmacist clinic or PA-C a this time.  Express preference to see MD due to recent changes in health.   Appointment with Dr Claiborne Billings scheduled for 01/24/17 @ 10:45am

## 2017-01-11 ENCOUNTER — Telehealth: Payer: Self-pay | Admitting: Cardiovascular Disease

## 2017-01-11 NOTE — Telephone Encounter (Signed)
Taking extra metoprolol with recurrent tachycardia seems reasonable. We will wait to see with the monitor shows as to whether this is fibrillation or flutter. We have not yet received the records from the outside hospital

## 2017-01-11 NOTE — Telephone Encounter (Signed)
I left a message for the patient to call. 

## 2017-01-11 NOTE — Telephone Encounter (Signed)
New Message    Per pt thinks she may have went back into afib or aflutter. She is requesting a call from nurse

## 2017-01-11 NOTE — Telephone Encounter (Signed)
I spoke with the patient and advised her of Dr. Olin Pia recommendations that she can use extra metoprolol as needed for fast heart rates and that we will need to see what her event monitor shows to clarify her rhythm.  She also reports that her BP/ HR are not well controlled with her new medication regimen. She was on metoprolol tartrate 25 mg BID and lisinopril 20 mg qAM/ 10 mg qPM prior to admission. At discharge, she was switched to metoprolol succinate 50 mg every morning and "her lisinopril was discontinued at discharge to allow for more BP room if needed for rate control per review with MD."  Per the patient's report, he HR's have been in the upper 40's to low 50's on the metoprolol succinate. Her BP has been 156-158/ 85 off lisinopril. She did take lisinopril 10 mg yesterday and today to try to help with her BP.   I have advised her to use a metoprolol tartrate 25 mg as needed for fast heart rates, but to track her BP. I have also advised her that she would be ok to take the low dose lisinopril 10 mg daily to help with her BP control until she hears back from Dr. Claiborne Billings, which is her primary cardiologist. She is agreeable and voices understanding.

## 2017-01-11 NOTE — Telephone Encounter (Signed)
Patient has appointment to see Cecilie Kicks tomorrow. 01/12/17 for evaluation at the [facility] location.

## 2017-01-11 NOTE — Telephone Encounter (Signed)
Follow Up   [name] is calling because her heart is back in rhythm and is wanting to know should she take a extra half of Metoprolol to make sure its stay in rhythm . Please call    Thanks

## 2017-01-11 NOTE — Telephone Encounter (Signed)
Spoke with pt states that she is back in AFIB/Flutter again. She states that this started last night~5pm since then her HR has been ranging 90-125. Pt declines to take extra metoprolol she is stating that "it did not work last and don/'t want to take extra now". No appts available here at [facility] today or tomorrow made appt for [place],NP 01-12-17'@1130'$  at our [facility] office, pt notified and will be there  On another note, pt wants stress test results:Notes Recorded by Sanda Klein, MD on 01/10/2017 at 5:11 PM EST;Normal nuclear stress test. Pt notified.  Also, pt is asking if Dr Caryl Comes ever received the stress ECHO from about 12-2012 to see if pt was in AFIB-onset

## 2017-01-12 ENCOUNTER — Ambulatory Visit (INDEPENDENT_AMBULATORY_CARE_PROVIDER_SITE_OTHER): Payer: Medicare Other | Admitting: Cardiology

## 2017-01-12 ENCOUNTER — Encounter: Payer: Self-pay | Admitting: Cardiology

## 2017-01-12 VITALS — BP 144/84 | HR 52 | Ht 63.0 in | Wt 161.8 lb

## 2017-01-12 DIAGNOSIS — I4892 Unspecified atrial flutter: Secondary | ICD-10-CM | POA: Diagnosis not present

## 2017-01-12 DIAGNOSIS — E7849 Other hyperlipidemia: Secondary | ICD-10-CM

## 2017-01-12 DIAGNOSIS — I10 Essential (primary) hypertension: Secondary | ICD-10-CM | POA: Diagnosis not present

## 2017-01-12 DIAGNOSIS — E784 Other hyperlipidemia: Secondary | ICD-10-CM | POA: Diagnosis not present

## 2017-01-12 DIAGNOSIS — R0609 Other forms of dyspnea: Secondary | ICD-10-CM | POA: Diagnosis not present

## 2017-01-12 DIAGNOSIS — I2510 Atherosclerotic heart disease of native coronary artery without angina pectoris: Secondary | ICD-10-CM

## 2017-01-12 DIAGNOSIS — I481 Persistent atrial fibrillation: Secondary | ICD-10-CM | POA: Diagnosis not present

## 2017-01-12 DIAGNOSIS — I4819 Other persistent atrial fibrillation: Secondary | ICD-10-CM

## 2017-01-12 DIAGNOSIS — R0600 Dyspnea, unspecified: Secondary | ICD-10-CM

## 2017-01-12 DIAGNOSIS — Z7901 Long term (current) use of anticoagulants: Secondary | ICD-10-CM

## 2017-01-12 MED ORDER — LISINOPRIL 10 MG PO TABS: ORAL_TABLET | ORAL | 3 refills | Status: DC

## 2017-01-12 NOTE — Patient Instructions (Addendum)
Medication Instructions:  Your physician has recommended you make the following change in your medication:  1.  RESUME the Lisinopril 10 mg taking 1 daily   Labwork: None ordered  Testing/Procedures: None ordered  Follow-Up: Your physician recommends that you schedule a follow-up appointment in: KEEP ALL SCHEDULED APPOINTMENTS   Any Other Special Instructions Will Be Listed Below (If Applicable).     If you need a refill on your cardiac medications before your next appointment, please call your pharmacy.
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
## 2017-01-15 ENCOUNTER — Ambulatory Visit (INDEPENDENT_AMBULATORY_CARE_PROVIDER_SITE_OTHER): Payer: Medicare Other

## 2017-01-15 DIAGNOSIS — I4892 Unspecified atrial flutter: Secondary | ICD-10-CM | POA: Diagnosis not present

## 2017-01-16 NOTE — Telephone Encounter (Signed)
ok 

## 2017-01-18 ENCOUNTER — Telehealth: Payer: Self-pay | Admitting: Physician Assistant

## 2017-01-18 DIAGNOSIS — H2512 Age-related nuclear cataract, left eye: Secondary | ICD-10-CM | POA: Diagnosis not present

## 2017-01-18 DIAGNOSIS — M3501 Sicca syndrome with keratoconjunctivitis: Secondary | ICD-10-CM | POA: Diagnosis not present

## 2017-01-18 NOTE — Telephone Encounter (Signed)
Paged by [facility]. Pt is in Afib/afultler since 4pm. Rate in 140s. Patient does reports current rate of 125bpm with BP of 134/70 after taking metoprolo tartrate '25mg'$  about 15 minutes ago. Asymptomatic. Advised to take another metoprolol tartrate 25 mg if weight is still above 1 20 bpm after 30 minute.  Continue coumadin. IF chest pain, shortness of breath, dizziness or syncope "2 EF of further evaluation. If still elevated rates overnight all office in the morning. Patient admitted with patent.

## 2017-01-19 ENCOUNTER — Telehealth: Payer: Self-pay | Admitting: Internal Medicine

## 2017-01-19 ENCOUNTER — Ambulatory Visit: Payer: Medicare Other

## 2017-01-19 NOTE — Telephone Encounter (Signed)
We were able to get access to the old medical record. ECG 11/44/14 demonstrates atrial fibrillation.  The course of therapy will be antiarrhythmic drugs. Given her young age, would suggest we start with dofetilide

## 2017-01-19 NOTE — Telephone Encounter (Signed)
New Message     Pt called on call last night and they told her to be sure to follow up with Dr Caryl Comes this morning , heart rate 125

## 2017-01-19 NOTE — Telephone Encounter (Signed)
Reviewed with Dr. Caryl Comes- he confirmed to have the patient continue metoprolol tartrate 25 mg as needed for fast heart rates.  Patient to continue to wear her monitor and follow up as scheduled with Dr. Caryl Comes on 02/15/17. She is aware that the plan will most likely be to start Phyllis Ginger which will require a 72 hour hospital stay.  She verbalizes understanding.

## 2017-01-24 ENCOUNTER — Encounter: Payer: Self-pay | Admitting: Cardiovascular Disease

## 2017-01-24 ENCOUNTER — Ambulatory Visit (INDEPENDENT_AMBULATORY_CARE_PROVIDER_SITE_OTHER): Payer: Medicare Other | Admitting: Cardiovascular Disease

## 2017-01-24 ENCOUNTER — Ambulatory Visit (INDEPENDENT_AMBULATORY_CARE_PROVIDER_SITE_OTHER): Payer: Medicare Other | Admitting: Pharmacist Clinician (PhC)/ Clinical Pharmacy Specialist

## 2017-01-24 VITALS — BP 164/70 | HR 49 | Ht 63.0 in | Wt 161.8 lb

## 2017-01-24 DIAGNOSIS — I480 Paroxysmal atrial fibrillation: Secondary | ICD-10-CM

## 2017-01-24 DIAGNOSIS — Z7901 Long term (current) use of anticoagulants: Secondary | ICD-10-CM

## 2017-01-24 DIAGNOSIS — I2510 Atherosclerotic heart disease of native coronary artery without angina pectoris: Secondary | ICD-10-CM

## 2017-01-24 DIAGNOSIS — E785 Hyperlipidemia, unspecified: Secondary | ICD-10-CM

## 2017-01-24 DIAGNOSIS — I10 Essential (primary) hypertension: Secondary | ICD-10-CM

## 2017-01-24 DIAGNOSIS — I4891 Unspecified atrial fibrillation: Secondary | ICD-10-CM | POA: Diagnosis not present

## 2017-01-24 DIAGNOSIS — I483 Typical atrial flutter: Secondary | ICD-10-CM

## 2017-01-24 DIAGNOSIS — I2583 Coronary atherosclerosis due to lipid rich plaque: Secondary | ICD-10-CM

## 2017-01-24 LAB — POCT INR: INR: 1.8

## 2017-01-24 NOTE — Patient Instructions (Signed)
Your physician wants you to follow-up in: 6 months or sooner if needed. You will receive a reminder letter in the mail two months in advance. If you don't receive a letter, please call our office to schedule the follow-up appointment.   If you need a refill on your cardiac medications before your next appointment, please call your pharmacy. 
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
## 2017-01-31 ENCOUNTER — Other Ambulatory Visit: Payer: Self-pay | Admitting: Cardiovascular Disease

## 2017-01-31 MED ORDER — LISINOPRIL 10 MG PO TABS: ORAL_TABLET | ORAL | 3 refills | Status: DC

## 2017-01-31 NOTE — Telephone Encounter (Signed)
New Message      *STAT* If patient is at the pharmacy, call can be transferred to refill team.   1. Which medications need to be refilled? (please list name of each medication and dose if known)  lisinopril (PRINIVIL,ZESTRIL) 10 MG tablet Take 1 tablet by mouth daily     2. Which pharmacy/location (including street and city if local pharmacy) is medication to be sent to? [place]   3. Do they need a 30 day or 90 day supply? 90   Please call she never received the medication on 3/2

## 2017-01-31 NOTE — Telephone Encounter (Signed)
Rx(s) sent to pharmacy electronically.  

## 2017-02-07 ENCOUNTER — Ambulatory Visit (INDEPENDENT_AMBULATORY_CARE_PROVIDER_SITE_OTHER): Payer: Medicare Other | Admitting: Pulmonary Disease

## 2017-02-07 DIAGNOSIS — R0602 Shortness of breath: Secondary | ICD-10-CM

## 2017-02-07 LAB — PULMONARY FUNCTION TEST
DL/VA % pred: 89 %
DL/VA: 4.31 ml/min/mmHg/L
DLCO cor % pred: 64 %
DLCO cor: 15.62 ml/min/mmHg
DLCO unc % pred: 64 %
DLCO unc: 15.67 ml/min/mmHg
FEF 25-75 Post: 0.69 L/sec
FEF 25-75 Pre: 0.56 L/sec
FEF2575-%Change-Post: 22 %
FEF2575-%Pred-Post: 39 %
FEF2575-%Pred-Pre: 31 %
FEV1-%Change-Post: 5 %
FEV1-%Pred-Post: 60 %
FEV1-%Pred-Pre: 57 %
FEV1-Post: 1.34 L
FEV1-Pre: 1.27 L
FEV1FVC-%Change-Post: 4 %
FEV1FVC-%Pred-Pre: 82 %
FEV6-%Change-Post: 0 %
FEV6-%Pred-Post: 72 %
FEV6-%Pred-Pre: 72 %
FEV6-Post: 2.01 L
FEV6-Pre: 2 L
FEV6FVC-%Change-Post: 0 %
FEV6FVC-%Pred-Post: 102 %
FEV6FVC-%Pred-Pre: 101 %
FVC-%Change-Post: 0 %
FVC-%Pred-Post: 71 %
FVC-%Pred-Pre: 70 %
FVC-Post: 2.06 L
FVC-Pre: 2.05 L
Post FEV1/FVC ratio: 65 %
Post FEV6/FVC ratio: 98 %
Pre FEV1/FVC ratio: 62 %
Pre FEV6/FVC Ratio: 98 %

## 2017-02-07 NOTE — Progress Notes (Signed)
PFT done today. 

## 2017-02-13 DIAGNOSIS — K921 Melena: Secondary | ICD-10-CM | POA: Diagnosis not present

## 2017-02-15 ENCOUNTER — Encounter: Payer: Self-pay | Admitting: Internal Medicine

## 2017-02-15 ENCOUNTER — Ambulatory Visit (INDEPENDENT_AMBULATORY_CARE_PROVIDER_SITE_OTHER): Payer: Medicare Other | Admitting: *Deleted

## 2017-02-15 ENCOUNTER — Ambulatory Visit (INDEPENDENT_AMBULATORY_CARE_PROVIDER_SITE_OTHER): Payer: Medicare Other | Admitting: Internal Medicine

## 2017-02-15 VITALS — BP 122/74 | HR 52 | Resp 97 | Ht 64.0 in | Wt 161.0 lb

## 2017-02-15 DIAGNOSIS — I2510 Atherosclerotic heart disease of native coronary artery without angina pectoris: Secondary | ICD-10-CM

## 2017-02-15 DIAGNOSIS — I4891 Unspecified atrial fibrillation: Secondary | ICD-10-CM

## 2017-02-15 DIAGNOSIS — Z7901 Long term (current) use of anticoagulants: Secondary | ICD-10-CM | POA: Diagnosis not present

## 2017-02-15 DIAGNOSIS — I480 Paroxysmal atrial fibrillation: Secondary | ICD-10-CM | POA: Diagnosis not present

## 2017-02-15 LAB — POCT INR: INR: 1.8

## 2017-02-15 NOTE — Patient Instructions (Signed)
Medication Instructions:    Your physician recommends that you continue on your current medications as directed. Please refer to the Current Medication list given to you today.  --- If you need a refill on your cardiac medications before your next appointment, please call your pharmacy. ---  Labwork:  None ordered  Testing/Procedures: Your physician has requested that you have an exercise tolerance test w/ Dr. Caryl Comes. For further information please visit [email or web address]. Please also follow instruction sheet, as given.  Follow-Up:  No follow up is needed at this time with Dr. Caryl Comes unless stress test shows something.  He will see you on an as needed basis.  Thank you for choosing [facility]!!     Any Other Special Instructions Will Be Listed Below (If Applicable).
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
## 2017-02-19 ENCOUNTER — Ambulatory Visit (INDEPENDENT_AMBULATORY_CARE_PROVIDER_SITE_OTHER): Payer: Medicare Other | Admitting: Acute Care

## 2017-02-19 ENCOUNTER — Ambulatory Visit (INDEPENDENT_AMBULATORY_CARE_PROVIDER_SITE_OTHER)
Admission: RE | Admit: 2017-02-19 | Discharge: 2017-02-19 | Disposition: A | Discharge status: Home / Self Care | Payer: Medicare Other | Source: Ambulatory Visit | Attending: Acute Care | Admitting: Acute Care

## 2017-02-19 ENCOUNTER — Encounter: Payer: Self-pay | Admitting: Acute Care

## 2017-02-19 ENCOUNTER — Encounter: Payer: Self-pay | Admitting: Pulmonary Disease

## 2017-02-19 ENCOUNTER — Ambulatory Visit (INDEPENDENT_AMBULATORY_CARE_PROVIDER_SITE_OTHER): Payer: Medicare Other | Admitting: Pulmonary Disease

## 2017-02-19 DIAGNOSIS — I2510 Atherosclerotic heart disease of native coronary artery without angina pectoris: Secondary | ICD-10-CM

## 2017-02-19 DIAGNOSIS — Z87891 Personal history of nicotine dependence: Secondary | ICD-10-CM | POA: Diagnosis not present

## 2017-02-19 DIAGNOSIS — J432 Centrilobular emphysema: Secondary | ICD-10-CM | POA: Diagnosis not present

## 2017-02-19 MED ORDER — UMECLIDINIUM BROMIDE 62.5 MCG/INH IN AEPB: 1.0000 | INHALATION_SPRAY | Freq: Every day | RESPIRATORY_TRACT | 0 refills | Status: DC
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
## 2017-02-19 NOTE — Progress Notes (Signed)
Shared Decision Making Visit Lung Cancer Screening Program [phone])   Eligibility:  Age 71 y.o.  Pack Years Smoking History Calculation 39 pack year smoking history (# packs/per year x # years smoked)  Recent History of coughing up blood  no  Unexplained weight loss? no ( >Than 15 pounds within the last 6 months )  Prior History Lung / other cancer no (Diagnosis within the last 5 years already requiring surveillance chest CT Scans).  Smoking Status Former Smoker  Former Smokers: Years since quit: 10 years  Quit Date: 09/15/2007  Visit Components:  Discussion included one or more decision making aids. yes  Discussion included risk/benefits of screening. yes  Discussion included potential follow up diagnostic testing for abnormal scans. yes  Discussion included meaning and risk of over diagnosis. yes  Discussion included meaning and risk of False Positives. yes  Discussion included meaning of total radiation exposure. yes  Counseling Included:  Importance of adherence to annual lung cancer LDCT screening. yes  Impact of comorbidities on ability to participate in the program. yes  Ability and willingness to under diagnostic treatment. yes  Smoking Cessation Counseling:  Current Smokers:   Discussed importance of smoking cessation. NA  Information about tobacco cessation classes and interventions provided to patient. yes  Patient provided with "ticket" for LDCT Scan. yes  Symptomatic Patient. no  Counseling  Diagnosis Code: Tobacco Use Z72.0  Asymptomatic Patient yes  Counseling (Intermediate counseling: > three minutes counseling) [id number]  Former Smokers:   Discussed the importance of maintaining cigarette abstinence. yes  Diagnosis Code: Personal History of Nicotine Dependence. U23.536  Information about tobacco cessation classes and interventions provided to patient. Yes  Patient provided with "ticket" for LDCT Scan. yes  Written Order for Lung  Cancer Screening with LDCT placed in Epic. Yes (CT Chest Lung Cancer Screening Low Dose W/O CM) [id number] Z12.2-Screening of respiratory organs Z87.891-Personal history of nicotine dependence  I spent 25 minutes of face to face time with [name] discussing the risks and benefits of lung cancer screening. We viewed a power point together that explained in detail the above noted topics. We took the time to pause the power point at intervals to allow for questions to be asked and answered to ensure understanding. We discussed that she had taken the single most powerful action possible to decrease her risk of developing lung cancer when she quit smoking. I counseled her to remain smoke free, and to contact me if she ever had the desire to smoke again so that I can provide resources and tools to help support the effort to remain smoke free. We discussed the time and location of the scan, and that either Bridget Glassman, RN  or I will call with the results within  24-48 hours of receiving them. She  has my card and contact information in the event she needs to speak with me, in addition to a copy of the power point we reviewed as a resource. She  verbalized understanding of all of the above and had no further questions upon leaving the office.   I spent 3 minutes of this visit counseling on continued smoking abstinence.  We discussed that this scan has shown a high incidence of CAD. [name] is currently on a statin and has her cholesterol checked on an annual basis. I explained this is a non-gated exam and degree or severity of disease cannot be determined. I told her we will fax scan results to her PCP for follow up  of any non-pulmonary findings. She verbalized understanding.   [name], AGACNP-BC [place] Pager # [phone] 02/19/2017

## 2017-02-19 NOTE — Patient Instructions (Signed)
Take Incruise 1 puff daily no matter how you feel Call me after you have taken for a few days to let me know if it's working okay for you I will call Dr. Caryl Comes to discuss your situation We will see you back in about 2 months or sooner if needed

## 2017-02-19 NOTE — Assessment & Plan Note (Signed)
Her lung function testing shows moderate airflow obstruction and the CT scan of her chest today shows at least moderate centrilobular emphysema.  I believe that this is at least contributing in part to her symptoms of dyspnea. She's quite concerned about the side effects of bronchodilator treatment in terms of worsening her dry eyes and atrial fibrillation. After significant discussion we decided to try increase 1 puff daily to see if this helps.  Plan: Start Incruise daily Rinse mouth daily Stay active, exercise regularly Get pneumonia vaccine with primary care physician  Greater than 50% of this 27 minute visit spent face-to-face

## 2017-02-20 ENCOUNTER — Telehealth: Payer: Self-pay | Admitting: Acute Care

## 2017-02-20 DIAGNOSIS — J438 Other emphysema: Secondary | ICD-10-CM

## 2017-02-20 DIAGNOSIS — Z87891 Personal history of nicotine dependence: Secondary | ICD-10-CM

## 2017-02-20 DIAGNOSIS — R911 Solitary pulmonary nodule: Secondary | ICD-10-CM

## 2017-02-20 DIAGNOSIS — R9389 Abnormal findings on diagnostic imaging of other specified body structures: Secondary | ICD-10-CM

## 2017-02-20 NOTE — Telephone Encounter (Signed)
I attempted to call [name] with the results of her low-dose screening CT. There was no answer. I have left a message with contact information requesting that the patient call the office for the results of her scan. We will await return call.  Patient returned the call. I explained to her that her scan was read as a lung RADS 4A, Lung RADS 4 A : suspicious findings, either short term follow up in 3 months or alternatively  PET Scan evaluation may be considered when there is a solid component of  8 mm or larger. I have placed an order for a low-dose follow-up scan in 3 months. I will have Dr. Baltazar Apo to review the imaging. [name] verbalized understanding of the above, and had no further questions. I also explained to her that her scan indicated aortic atherosclerosis. Patient has had CABG in the past, and is currently on statin therapy.

## 2017-02-21 DIAGNOSIS — Z23 Encounter for immunization: Secondary | ICD-10-CM | POA: Diagnosis not present

## 2017-02-22 DIAGNOSIS — K5730 Diverticulosis of large intestine without perforation or abscess without bleeding: Secondary | ICD-10-CM | POA: Diagnosis not present

## 2017-02-22 DIAGNOSIS — K625 Hemorrhage of anus and rectum: Secondary | ICD-10-CM | POA: Diagnosis not present

## 2017-03-01 ENCOUNTER — Ambulatory Visit (INDEPENDENT_AMBULATORY_CARE_PROVIDER_SITE_OTHER): Payer: Medicare Other | Admitting: Pharmacist

## 2017-03-01 ENCOUNTER — Telehealth: Payer: Self-pay | Admitting: Pulmonary Disease

## 2017-03-01 ENCOUNTER — Ambulatory Visit (INDEPENDENT_AMBULATORY_CARE_PROVIDER_SITE_OTHER): Payer: Medicare Other | Admitting: Internal Medicine

## 2017-03-01 DIAGNOSIS — I480 Paroxysmal atrial fibrillation: Secondary | ICD-10-CM

## 2017-03-01 DIAGNOSIS — I4891 Unspecified atrial fibrillation: Secondary | ICD-10-CM | POA: Diagnosis not present

## 2017-03-01 DIAGNOSIS — I2510 Atherosclerotic heart disease of native coronary artery without angina pectoris: Secondary | ICD-10-CM

## 2017-03-01 DIAGNOSIS — Z7901 Long term (current) use of anticoagulants: Secondary | ICD-10-CM | POA: Diagnosis not present

## 2017-03-01 LAB — POCT INR: INR: 2.3

## 2017-03-01 MED ORDER — UMECLIDINIUM BROMIDE 62.5 MCG/INH IN AEPB: 1.0000 | INHALATION_SPRAY | Freq: Every day | RESPIRATORY_TRACT | 5 refills | Status: DC

## 2017-03-01 NOTE — Telephone Encounter (Signed)
Returned call, CB is [phone].  Patient states she had 3 days left of med and needs by weekend.

## 2017-03-01 NOTE — Telephone Encounter (Signed)
lmomtcb x 1 for the pt 

## 2017-03-01 NOTE — Telephone Encounter (Signed)
Spoke with the pt  She states that the Incruse has helped her breathing by at least 75%  She is requesting rx to be sent to pharm, and I have done so  Nothing further needed

## 2017-03-13 DIAGNOSIS — C3490 Malignant neoplasm of unspecified part of unspecified bronchus or lung: Secondary | ICD-10-CM | POA: Insufficient documentation

## 2017-03-22 LAB — EXERCISE TOLERANCE TEST
Estimated workload: 5.8 METS
Exercise duration (min): 3 min
Exercise duration (sec): 21 s
MPHR: 148 {beats}/min
Peak HR: 100 {beats}/min
Percent HR: 67 %
RPE: 17
Rest HR: 54 {beats}/min

## 2017-03-27 ENCOUNTER — Ambulatory Visit (INDEPENDENT_AMBULATORY_CARE_PROVIDER_SITE_OTHER): Payer: Medicare Other | Admitting: *Deleted

## 2017-03-27 DIAGNOSIS — I2510 Atherosclerotic heart disease of native coronary artery without angina pectoris: Secondary | ICD-10-CM

## 2017-03-27 DIAGNOSIS — I4891 Unspecified atrial fibrillation: Secondary | ICD-10-CM

## 2017-03-27 DIAGNOSIS — Z7901 Long term (current) use of anticoagulants: Secondary | ICD-10-CM | POA: Diagnosis not present

## 2017-03-27 DIAGNOSIS — I480 Paroxysmal atrial fibrillation: Secondary | ICD-10-CM | POA: Diagnosis not present

## 2017-03-27 LAB — POCT INR: INR: 3

## 2017-03-30 ENCOUNTER — Other Ambulatory Visit: Payer: Self-pay | Admitting: Cardiovascular Disease

## 2017-04-03 ENCOUNTER — Telehealth: Payer: Self-pay | Admitting: Cardiovascular Disease

## 2017-04-03 ENCOUNTER — Telehealth: Payer: Self-pay | Admitting: Acute Care

## 2017-04-03 NOTE — Telephone Encounter (Signed)
Patient calling, states that after she made the appt to see Dr. Claiborne Billings on 07-31-17 she started to wonder if Dr. Claiborne Billings wanted to see her in 3 months or in 6 months. Please call patient to discuss, thanks.

## 2017-04-03 NOTE — Telephone Encounter (Signed)
Returned call to patient-advised per last OV note, 6 month f/u was recommended.  Patient aware and verbalized understanding.  Advised to call sooner if needed with questions or concerns.

## 2017-04-04 NOTE — Telephone Encounter (Signed)
Rodena Piety [facility]) is aware and will be calling pt to schedule f/u CT.

## 2017-04-04 NOTE — Telephone Encounter (Signed)
Routing to lung nodule pool for follow up.

## 2017-04-10 NOTE — Telephone Encounter (Signed)
Rodena Piety please advise when CT has been scheduled so we may  Close this message. Thanks

## 2017-04-10 NOTE — Telephone Encounter (Signed)
Mrs. [name] is scheduled on 05/23/2017 @ 2:00pm and she is aware of this appt

## 2017-04-13 DIAGNOSIS — C3432 Malignant neoplasm of lower lobe, left bronchus or lung: Secondary | ICD-10-CM | POA: Insufficient documentation

## 2017-04-18 DIAGNOSIS — K047 Periapical abscess without sinus: Secondary | ICD-10-CM | POA: Diagnosis not present

## 2017-04-18 DIAGNOSIS — I10 Essential (primary) hypertension: Secondary | ICD-10-CM | POA: Diagnosis not present

## 2017-04-19 ENCOUNTER — Telehealth: Payer: Self-pay | Admitting: Cardiovascular Disease

## 2017-04-19 NOTE — Telephone Encounter (Signed)
Spoke with dentist office and receptionist stated that pt is being referred to [facility]. Pt reports that 1 tooth is really hurting her but the dentist recommends that she have 2-3 teeth pulled.  Called pt and she has not seen the oral surgeon yet. She wanted Coumadin guidance before scheduling. Advised her that Coumadin instructions would vary depending on how many extractions she is having. Pt states she will call the oral surgeon and will inform them to contact us whenever procedure is scheduled.

## 2017-04-19 NOTE — Telephone Encounter (Signed)
Pt takes warfarin for afib with CHADS2 of 1 (HTN) and CHADS2VASc of 4 (CAD, HTN, age, sex), cardioversion was 4 months ago.   Called dentist office to clarify # of teeth being extracted - office is at lunch.

## 2017-04-19 NOTE — Telephone Encounter (Signed)
New message    1. What dental office are you calling from? Dr. Hyman Hopes   2. What is your office phone and fax number? Phone-[phone] Fax-[phone]  3. What type of procedure is the patient having performed? Extraction   4. What date is procedure scheduled? Not scheduled yet.  5. What is your question (ex. Antibiotics prior to procedure, holding medication-we need to know how long dentist wants pt to hold med)? When should pt stop and restart coumadin or should she stay on it?

## 2017-04-19 NOTE — Telephone Encounter (Signed)
Will forward to the CV[facility] clinic.

## 2017-04-20 ENCOUNTER — Emergency Department: Payer: Medicare Other

## 2017-04-20 ENCOUNTER — Emergency Department
Admission: EM | Admit: 2017-04-20 | Discharge: 2017-04-20 | Disposition: A | Discharge status: Home / Self Care | Payer: Medicare Other | Attending: Emergency Medicine | Admitting: Emergency Medicine

## 2017-04-20 ENCOUNTER — Encounter: Payer: Self-pay | Admitting: Emergency Medicine

## 2017-04-20 DIAGNOSIS — Z7901 Long term (current) use of anticoagulants: Secondary | ICD-10-CM | POA: Insufficient documentation

## 2017-04-20 DIAGNOSIS — I10 Essential (primary) hypertension: Secondary | ICD-10-CM | POA: Diagnosis not present

## 2017-04-20 DIAGNOSIS — Z7982 Long term (current) use of aspirin: Secondary | ICD-10-CM | POA: Diagnosis not present

## 2017-04-20 DIAGNOSIS — R0789 Other chest pain: Secondary | ICD-10-CM | POA: Diagnosis not present

## 2017-04-20 DIAGNOSIS — Z91040 Latex allergy status: Secondary | ICD-10-CM | POA: Diagnosis not present

## 2017-04-20 DIAGNOSIS — Z85828 Personal history of other malignant neoplasm of skin: Secondary | ICD-10-CM | POA: Diagnosis not present

## 2017-04-20 DIAGNOSIS — I2510 Atherosclerotic heart disease of native coronary artery without angina pectoris: Secondary | ICD-10-CM | POA: Diagnosis not present

## 2017-04-20 DIAGNOSIS — K219 Gastro-esophageal reflux disease without esophagitis: Secondary | ICD-10-CM | POA: Insufficient documentation

## 2017-04-20 DIAGNOSIS — Z79899 Other long term (current) drug therapy: Secondary | ICD-10-CM | POA: Diagnosis not present

## 2017-04-20 DIAGNOSIS — R079 Chest pain, unspecified: Secondary | ICD-10-CM | POA: Diagnosis not present

## 2017-04-20 DIAGNOSIS — Z87891 Personal history of nicotine dependence: Secondary | ICD-10-CM | POA: Insufficient documentation

## 2017-04-20 LAB — BASIC METABOLIC PANEL
Anion gap: 8 (ref 5–15)
BUN: 12 mg/dL (ref 6–20)
CO2: 27 mmol/L (ref 22–32)
Calcium: 9 mg/dL (ref 8.9–10.3)
Chloride: 97 mmol/L — ABNORMAL LOW (ref 101–111)
Creatinine, Ser: 0.48 mg/dL (ref 0.44–1.00)
GFR calc Af Amer: >60 mL/min (ref >60)
GFR calc non Af Amer: >60 mL/min (ref >60)
Glucose, Bld: 112 mg/dL — ABNORMAL HIGH (ref 65–99)
Potassium: 3.8 mmol/L (ref 3.5–5.1)
Sodium: 132 mmol/L — ABNORMAL LOW (ref 135–145)

## 2017-04-20 LAB — CBC
HCT: 38.7 % (ref 35.0–47.0)
Hemoglobin: 13.2 g/dL (ref 12.0–16.0)
MCH: 30.1 pg (ref 26.0–34.0)
MCHC: 34 g/dL (ref 32.0–36.0)
MCV: 88.6 fL (ref 80.0–100.0)
Platelets: 251 10*3/uL (ref 150–440)
RBC: 4.37 MIL/uL (ref 3.80–5.20)
RDW: 13.3 % (ref 11.5–14.5)
WBC: 10.8 10*3/uL (ref 3.6–11.0)

## 2017-04-20 LAB — TROPONIN I
Troponin I: <0.03 ng/mL (ref <0.03)
Troponin I: <0.03 ng/mL (ref <0.03)

## 2017-04-20 MED ORDER — ALUMINUM-MAGNESIUM-SIMETHICONE 200-200-20 MG/5ML PO SUSP: 30.0000 mL | Freq: Three times a day (TID) | ORAL | 0 refills | Status: DC

## 2017-04-20 MED ORDER — AMOXICILLIN 500 MG PO TABS: 500.0000 mg | ORAL_TABLET | Freq: Two times a day (BID) | ORAL | 0 refills | Status: AC

## 2017-04-20 MED ORDER — GI COCKTAIL ~~LOC~~
30.0000 mL | Freq: Once | ORAL | Status: AC
  Administered 2017-04-20: 30 mL via ORAL
  Filled 2017-04-20: qty 30

## 2017-04-20 MED ORDER — FAMOTIDINE 20 MG PO TABS: 20.0000 mg | ORAL_TABLET | Freq: Two times a day (BID) | ORAL | 0 refills | Status: DC

## 2017-04-20 MED ORDER — TRAMADOL HCL 50 MG PO TABS: 50.0000 mg | ORAL_TABLET | Freq: Four times a day (QID) | ORAL | 0 refills | Status: DC | PRN

## 2017-04-20 MED ORDER — FAMOTIDINE IN NACL 20-0.9 MG/50ML-% IV SOLN
20.0000 mg | Freq: Once | INTRAVENOUS | Status: AC
  Administered 2017-04-20: 20 mg via INTRAVENOUS
  Filled 2017-04-20: qty 50

## 2017-04-20 NOTE — ED Notes (Signed)
First Nurse Note:  C/O indigestion since this morning.  Has history of CABG in 2008.  Denies SOB/ DOE

## 2017-04-20 NOTE — Discharge Instructions (Signed)
It was a pleasure to take care of you today, and thank you for coming to our emergency department.  If you have any questions or concerns before leaving please ask the nurse to grab me and I'm more than happy to go through your aftercare instructions again.  You are allergic to the other antibiotics that would potentially help with your dental infection, so we will need to stick with the clindamycin for now. Take antacid medicine to control these symptoms in the meantime.  If you have any concerns once you are home that you are not improving or are in fact getting worse before you can make it to your follow-up appointment, please do not hesitate to call 911 and come back for further evaluation.  You should return to the emergency room immediately if you have new or severe symptoms such as chest pain, difficulty breathing, passing out, high fever, severe pain, or unremitting vomiting.   Available test results from today are listed below.   Carrie Mew, MD  Results for orders placed or performed during the hospital encounter of 94/49/67  Basic metabolic panel  Result Value Ref Range   Sodium 132 (L) 135 - 145 mmol/L   Potassium 3.8 3.5 - 5.1 mmol/L   Chloride 97 (L) 101 - 111 mmol/L   CO2 27 22 - 32 mmol/L   Glucose, Bld 112 (H) 65 - 99 mg/dL   BUN 12 6 - 20 mg/dL   Creatinine, Ser 0.48 0.44 - 1.00 mg/dL   Calcium 9.0 8.9 - 10.3 mg/dL   GFR calc non Af Amer >60 >60 mL/min   GFR calc Af Amer >60 >60 mL/min   Anion gap 8 5 - 15  CBC  Result Value Ref Range   WBC 10.8 3.6 - 11.0 K/uL   RBC 4.37 3.80 - 5.20 MIL/uL   Hemoglobin 13.2 12.0 - 16.0 g/dL   HCT 38.7 35.0 - 47.0 %   MCV 88.6 80.0 - 100.0 fL   MCH 30.1 26.0 - 34.0 pg   MCHC 34.0 32.0 - 36.0 g/dL   RDW 13.3 11.5 - 14.5 %   Platelets 251 150 - 440 K/uL  Troponin I  Result Value Ref Range   Troponin I <0.03 <0.03 ng/mL   Dg Chest 2 View  Result Date: 04/20/2017 CLINICAL DATA:  Patient with chest pain and pressure. EXAM:  CHEST  2 VIEW COMPARISON:  Chest radiograph 01/02/2017 FINDINGS: Stable cardiac and mediastinal contours. Aortic vascular calcification. No consolidative pulmonary opacities. No pleural effusion or pneumothorax. Thoracic spine degenerative changes. Stable thoracic spine wedge compression deformity. IMPRESSION: No acute cardiopulmonary process. Electronically Signed   By: Lovey Newcomer M.D.   On: 04/20/2017 16:51

## 2017-04-20 NOTE — ED Provider Notes (Signed)
Second troponin is negative. The patient is concerned that her symptoms are related to clindamycin and is requesting an alternative treatment. She has taken amoxicillin before with no issues so I will treat her with amoxicillin and tramadol at her request for dental pain.   Darel Hong, MD 04/20/17 2154

## 2017-04-20 NOTE — ED Notes (Signed)
ED Provider at bedside. 

## 2017-04-20 NOTE — ED Triage Notes (Signed)
Patient presents to the ED with burning/pressure to her chest since yesterday evening after dinner.  Patient states, "It feels like indigestion but I've never had indigestion this long before."  Patient reports having difficulty drinking and eating.  Patient denies vomiting.  Patient reports shortness of breath at baseline due to copd.  Patient reports history of cardiac bypass surgery.  Patient just started a new antibiotic medication and new pain medication on Wednesday due to an abscessed tooth.

## 2017-04-20 NOTE — ED Notes (Signed)
ED Provider at bedside to answer patient questions brought up during discharge.  Patient states she is still having pain when she swallows but passes a PO challenge.
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
## 2017-04-23 ENCOUNTER — Ambulatory Visit (INDEPENDENT_AMBULATORY_CARE_PROVIDER_SITE_OTHER): Payer: Medicare Other | Admitting: *Deleted

## 2017-04-23 ENCOUNTER — Telehealth: Payer: Self-pay | Admitting: Cardiovascular Disease

## 2017-04-23 DIAGNOSIS — I480 Paroxysmal atrial fibrillation: Secondary | ICD-10-CM | POA: Diagnosis not present

## 2017-04-23 DIAGNOSIS — Z7901 Long term (current) use of anticoagulants: Secondary | ICD-10-CM

## 2017-04-23 DIAGNOSIS — I2510 Atherosclerotic heart disease of native coronary artery without angina pectoris: Secondary | ICD-10-CM

## 2017-04-23 DIAGNOSIS — I4891 Unspecified atrial fibrillation: Secondary | ICD-10-CM | POA: Diagnosis not present

## 2017-04-23 LAB — POCT INR: INR: 3.5

## 2017-04-23 NOTE — Telephone Encounter (Signed)
Spoke with pt, questions regarding SBE answered.

## 2017-04-23 NOTE — Telephone Encounter (Signed)
New message    Pt is calling to find out if having a heart problem will interfere with having dental work done next week.

## 2017-04-25 DIAGNOSIS — R6884 Jaw pain: Secondary | ICD-10-CM | POA: Diagnosis not present

## 2017-04-25 DIAGNOSIS — G44209 Tension-type headache, unspecified, not intractable: Secondary | ICD-10-CM | POA: Diagnosis not present

## 2017-05-01 ENCOUNTER — Ambulatory Visit (INDEPENDENT_AMBULATORY_CARE_PROVIDER_SITE_OTHER): Payer: Medicare Other | Admitting: *Deleted

## 2017-05-01 DIAGNOSIS — I480 Paroxysmal atrial fibrillation: Secondary | ICD-10-CM | POA: Diagnosis not present

## 2017-05-01 DIAGNOSIS — Z7901 Long term (current) use of anticoagulants: Secondary | ICD-10-CM

## 2017-05-01 DIAGNOSIS — I4891 Unspecified atrial fibrillation: Secondary | ICD-10-CM | POA: Diagnosis not present

## 2017-05-01 DIAGNOSIS — I2510 Atherosclerotic heart disease of native coronary artery without angina pectoris: Secondary | ICD-10-CM

## 2017-05-01 LAB — POCT INR: INR: 1.5

## 2017-05-02 ENCOUNTER — Other Ambulatory Visit: Payer: Self-pay | Admitting: Cardiovascular Disease

## 2017-05-09 ENCOUNTER — Ambulatory Visit (INDEPENDENT_AMBULATORY_CARE_PROVIDER_SITE_OTHER): Payer: Medicare Other

## 2017-05-09 DIAGNOSIS — I2510 Atherosclerotic heart disease of native coronary artery without angina pectoris: Secondary | ICD-10-CM

## 2017-05-09 DIAGNOSIS — Z7901 Long term (current) use of anticoagulants: Secondary | ICD-10-CM

## 2017-05-09 DIAGNOSIS — I480 Paroxysmal atrial fibrillation: Secondary | ICD-10-CM

## 2017-05-09 DIAGNOSIS — I4891 Unspecified atrial fibrillation: Secondary | ICD-10-CM | POA: Diagnosis not present

## 2017-05-09 LAB — POCT INR: INR: 3.2

## 2017-05-22 DIAGNOSIS — D2371 Other benign neoplasm of skin of right lower limb, including hip: Secondary | ICD-10-CM | POA: Diagnosis not present

## 2017-05-22 DIAGNOSIS — L821 Other seborrheic keratosis: Secondary | ICD-10-CM | POA: Diagnosis not present

## 2017-05-22 DIAGNOSIS — D485 Neoplasm of uncertain behavior of skin: Secondary | ICD-10-CM | POA: Diagnosis not present

## 2017-05-22 DIAGNOSIS — C44722 Squamous cell carcinoma of skin of right lower limb, including hip: Secondary | ICD-10-CM | POA: Diagnosis not present

## 2017-05-23 ENCOUNTER — Telehealth: Payer: Self-pay | Admitting: Acute Care

## 2017-05-23 ENCOUNTER — Ambulatory Visit (INDEPENDENT_AMBULATORY_CARE_PROVIDER_SITE_OTHER): Payer: Medicare Other | Admitting: *Deleted

## 2017-05-23 ENCOUNTER — Ambulatory Visit (INDEPENDENT_AMBULATORY_CARE_PROVIDER_SITE_OTHER)
Admission: RE | Admit: 2017-05-23 | Discharge: 2017-05-23 | Disposition: A | Discharge status: Home / Self Care | Payer: Medicare Other | Source: Ambulatory Visit | Attending: Acute Care | Admitting: Acute Care

## 2017-05-23 DIAGNOSIS — I4891 Unspecified atrial fibrillation: Secondary | ICD-10-CM

## 2017-05-23 DIAGNOSIS — R911 Solitary pulmonary nodule: Secondary | ICD-10-CM | POA: Diagnosis not present

## 2017-05-23 DIAGNOSIS — Z87891 Personal history of nicotine dependence: Secondary | ICD-10-CM

## 2017-05-23 DIAGNOSIS — I480 Paroxysmal atrial fibrillation: Secondary | ICD-10-CM

## 2017-05-23 DIAGNOSIS — I2510 Atherosclerotic heart disease of native coronary artery without angina pectoris: Secondary | ICD-10-CM

## 2017-05-23 DIAGNOSIS — Z7901 Long term (current) use of anticoagulants: Secondary | ICD-10-CM | POA: Diagnosis not present

## 2017-05-23 LAB — POCT INR: INR: 3.9

## 2017-05-23 NOTE — Telephone Encounter (Signed)
Received call report on 7.11.18 CT Chest LCS Nodule f/u:  IMPRESSION: 1. Interval progression of left lower lobe pulmonary nodule now measuring 10.9 mm volume derived mean diameter. Lung-RADS 4B, suspicious. Additional imaging evaluation or consultation with Pulmonology or Thoracic Surgery recommended. 2. Emphysema. 3.  Aortic Atherosclerois (ICD10-170.0) These results will be called to the ordering clinician or representative by the Radiologist Assistant, and communication documented in the PACS or zVision Dashboard.   [name] is aware call report received and will call patient tomorrow Message routed to [facility] and copy of report printed and placed on her desk

## 2017-05-24 ENCOUNTER — Other Ambulatory Visit: Payer: Self-pay | Admitting: Acute Care

## 2017-05-24 DIAGNOSIS — Z87891 Personal history of nicotine dependence: Secondary | ICD-10-CM

## 2017-05-25 ENCOUNTER — Ambulatory Visit (INDEPENDENT_AMBULATORY_CARE_PROVIDER_SITE_OTHER): Payer: Medicare Other | Admitting: Pulmonary Disease

## 2017-05-25 DIAGNOSIS — Z87891 Personal history of nicotine dependence: Secondary | ICD-10-CM

## 2017-05-25 LAB — PULMONARY FUNCTION TEST
DL/VA % pred: 78 %
DL/VA: 3.59 ml/min/mmHg/L
DLCO cor % pred: 66 %
DLCO cor: 14.53 ml/min/mmHg
DLCO unc % pred: 67 %
DLCO unc: 14.71 ml/min/mmHg
FEF 25-75 Post: 1.02 L/sec
FEF 25-75 Pre: 0.59 L/sec
FEF2575-%Change-Post: 71 %
FEF2575-%Pred-Post: 61 %
FEF2575-%Pred-Pre: 35 %
FEV1-%Change-Post: 19 %
FEV1-%Pred-Post: 80 %
FEV1-%Pred-Pre: 67 %
FEV1-Post: 1.61 L
FEV1-Pre: 1.35 L
FEV1FVC-%Change-Post: 6 %
FEV1FVC-%Pred-Pre: 78 %
FEV6-%Change-Post: 13 %
FEV6-%Pred-Post: 99 %
FEV6-%Pred-Pre: 87 %
FEV6-Post: 2.54 L
FEV6-Pre: 2.23 L
FEV6FVC-%Change-Post: 1 %
FEV6FVC-%Pred-Post: 103 %
FEV6FVC-%Pred-Pre: 102 %
FVC-%Change-Post: 12 %
FVC-%Pred-Post: 96 %
FVC-%Pred-Pre: 85 %
FVC-Post: 2.57 L
FVC-Pre: 2.28 L
Post FEV1/FVC ratio: 63 %
Post FEV6/FVC ratio: 99 %
Pre FEV1/FVC ratio: 59 %
Pre FEV6/FVC Ratio: 98 %
RV % pred: 112 %
RV: 2.44 L
TLC % pred: 100 %
TLC: 4.82 L

## 2017-05-25 NOTE — Progress Notes (Signed)
PFT done today. 

## 2017-05-30 ENCOUNTER — Telehealth: Payer: Self-pay | Admitting: Cardiovascular Disease

## 2017-05-30 ENCOUNTER — Ambulatory Visit
Admission: RE | Admit: 2017-05-30 | Discharge: 2017-05-30 | Disposition: A | Discharge status: Home / Self Care | Payer: Medicare Other | Source: Ambulatory Visit | Attending: Acute Care | Admitting: Acute Care

## 2017-05-30 DIAGNOSIS — Z87891 Personal history of nicotine dependence: Secondary | ICD-10-CM | POA: Diagnosis not present

## 2017-05-30 DIAGNOSIS — I517 Cardiomegaly: Secondary | ICD-10-CM | POA: Diagnosis not present

## 2017-05-30 DIAGNOSIS — R911 Solitary pulmonary nodule: Secondary | ICD-10-CM | POA: Diagnosis not present

## 2017-05-30 DIAGNOSIS — Z951 Presence of aortocoronary bypass graft: Secondary | ICD-10-CM | POA: Diagnosis not present

## 2017-05-30 DIAGNOSIS — I700 Atherosclerosis of aorta: Secondary | ICD-10-CM | POA: Diagnosis not present

## 2017-05-30 LAB — GLUCOSE, CAPILLARY: Glucose-Capillary: 78 mg/dL (ref 65–99)

## 2017-05-30 MED ORDER — FLUDEOXYGLUCOSE F - 18 (FDG) INJECTION
12.0000 | Freq: Once | INTRAVENOUS | Status: AC | PRN
  Administered 2017-05-30: 13.12 via INTRAVENOUS

## 2017-05-30 MED ORDER — METOPROLOL SUCCINATE ER 50 MG PO TB24: 50.0000 mg | ORAL_TABLET | Freq: Every day | ORAL | 3 refills | Status: DC

## 2017-05-30 NOTE — Telephone Encounter (Signed)
Rx sent to preferred pharmacy.

## 2017-05-30 NOTE — Telephone Encounter (Signed)
New message        *STAT* If patient is at the pharmacy, call can be transferred to refill team.   1. Which medications need to be refilled? (please list name of each medication and dose if known)  Metoprolol 50mg   2. Which pharmacy/location (including street and city if local pharmacy) is medication to be sent to? [facility] at [address] in [place]. Do they need a 30 day or 90 day supply? 30 day----------pt is out of medication

## 2017-05-31 ENCOUNTER — Telehealth: Payer: Self-pay | Admitting: Pulmonary Disease

## 2017-05-31 NOTE — Telephone Encounter (Signed)
SG  Please advise-  Pt is calling for PET scan results

## 2017-06-01 ENCOUNTER — Other Ambulatory Visit: Payer: Self-pay | Admitting: Acute Care

## 2017-06-01 DIAGNOSIS — Z87891 Personal history of nicotine dependence: Secondary | ICD-10-CM

## 2017-06-04 NOTE — Telephone Encounter (Signed)
These results were called to the patient on 06/01/2017 at 4 pm. Please see results. There is nothing further needed.

## 2017-06-04 NOTE — Telephone Encounter (Signed)
SG please advise on the CT results. Thanks

## 2017-06-06 ENCOUNTER — Ambulatory Visit (INDEPENDENT_AMBULATORY_CARE_PROVIDER_SITE_OTHER): Payer: Medicare Other | Admitting: *Deleted

## 2017-06-06 ENCOUNTER — Institutional Professional Consult (permissible substitution) (INDEPENDENT_AMBULATORY_CARE_PROVIDER_SITE_OTHER): Payer: Medicare Other | Admitting: Surgery

## 2017-06-06 ENCOUNTER — Encounter: Payer: Self-pay | Admitting: Surgery

## 2017-06-06 VITALS — BP 159/74 | HR 50 | Resp 16 | Ht 63.0 in | Wt 151.0 lb

## 2017-06-06 DIAGNOSIS — I2510 Atherosclerotic heart disease of native coronary artery without angina pectoris: Secondary | ICD-10-CM

## 2017-06-06 DIAGNOSIS — D381 Neoplasm of uncertain behavior of trachea, bronchus and lung: Secondary | ICD-10-CM | POA: Diagnosis not present

## 2017-06-06 DIAGNOSIS — Z7901 Long term (current) use of anticoagulants: Secondary | ICD-10-CM | POA: Diagnosis not present

## 2017-06-06 DIAGNOSIS — Z951 Presence of aortocoronary bypass graft: Secondary | ICD-10-CM | POA: Diagnosis not present

## 2017-06-06 DIAGNOSIS — I4891 Unspecified atrial fibrillation: Secondary | ICD-10-CM | POA: Diagnosis not present

## 2017-06-06 DIAGNOSIS — I480 Paroxysmal atrial fibrillation: Secondary | ICD-10-CM | POA: Diagnosis not present

## 2017-06-06 LAB — POCT INR: INR: 2.7

## 2017-06-07 ENCOUNTER — Other Ambulatory Visit: Payer: Self-pay | Admitting: *Deleted

## 2017-06-07 ENCOUNTER — Encounter: Payer: Self-pay | Admitting: Surgery

## 2017-06-07 DIAGNOSIS — R911 Solitary pulmonary nodule: Secondary | ICD-10-CM
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
## 2017-06-11 ENCOUNTER — Telehealth: Payer: Self-pay

## 2017-06-11 NOTE — Telephone Encounter (Signed)
Pt called states she saw Dr [name] 06/06/17, and he has scheduled pt for surgery Thoracotomy/L Lower Lobectomy on 07/02/17.  In Dr Vivi Martens note pt has been instructed to hold Coumadin x 4 days prior to surgery.  Please advise if ok to hold Coumadin prior to surgery and if will need bridging.  Thanks

## 2017-06-11 NOTE — Telephone Encounter (Signed)
Pt takes Coumadin for afib with no hx of stroke or mechanical heart valve. Ok to hold x4 days prior to procedure.

## 2017-06-14 ENCOUNTER — Ambulatory Visit (INDEPENDENT_AMBULATORY_CARE_PROVIDER_SITE_OTHER): Payer: Medicare Other | Admitting: Internal Medicine

## 2017-06-14 ENCOUNTER — Encounter: Payer: Self-pay | Admitting: Internal Medicine

## 2017-06-14 VITALS — BP 160/100 | HR 56 | Ht 63.0 in | Wt 153.8 lb

## 2017-06-14 DIAGNOSIS — I480 Paroxysmal atrial fibrillation: Secondary | ICD-10-CM

## 2017-06-14 DIAGNOSIS — R001 Bradycardia, unspecified: Secondary | ICD-10-CM | POA: Diagnosis not present

## 2017-06-14 DIAGNOSIS — I4892 Unspecified atrial flutter: Secondary | ICD-10-CM | POA: Diagnosis not present

## 2017-06-14 DIAGNOSIS — I2510 Atherosclerotic heart disease of native coronary artery without angina pectoris: Secondary | ICD-10-CM

## 2017-06-14 NOTE — Patient Instructions (Signed)
Medication Instructions: - Your physician recommends that you continue on your current medications as directed. Please refer to the Current Medication list given to you today.  Labwork: - none ordered  Procedures/Testing: - none ordered  Follow-Up: - Dr. Klein will see you back on an as needed basis.  Any Additional Special Instructions Will Be Listed Below (If Applicable).     If you need a refill on your cardiac medications before your next appointment, please call your pharmacy.   
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
## 2017-06-28 ENCOUNTER — Other Ambulatory Visit (HOSPITAL_COMMUNITY): Payer: Medicare Other

## 2017-06-29 ENCOUNTER — Ambulatory Visit (HOSPITAL_COMMUNITY)
Admission: RE | Admit: 2017-06-29 | Discharge: 2017-06-29 | Disposition: A | Discharge status: Home / Self Care | Payer: Medicare Other | Source: Ambulatory Visit | Attending: Surgery | Admitting: Surgery

## 2017-06-29 ENCOUNTER — Encounter (HOSPITAL_COMMUNITY): Payer: Self-pay

## 2017-06-29 ENCOUNTER — Telehealth: Payer: Self-pay | Admitting: Cardiovascular Disease

## 2017-06-29 ENCOUNTER — Encounter (HOSPITAL_COMMUNITY)
Admission: RE | Admit: 2017-06-29 | Discharge: 2017-06-29 | Disposition: A | Discharge status: Home / Self Care | Payer: Medicare Other | Source: Ambulatory Visit | Attending: Surgery | Admitting: Surgery

## 2017-06-29 DIAGNOSIS — Z7902 Long term (current) use of antithrombotics/antiplatelets: Secondary | ICD-10-CM | POA: Insufficient documentation

## 2017-06-29 DIAGNOSIS — Z951 Presence of aortocoronary bypass graft: Secondary | ICD-10-CM | POA: Insufficient documentation

## 2017-06-29 DIAGNOSIS — G43909 Migraine, unspecified, not intractable, without status migrainosus: Secondary | ICD-10-CM | POA: Insufficient documentation

## 2017-06-29 DIAGNOSIS — Z01818 Encounter for other preprocedural examination: Secondary | ICD-10-CM

## 2017-06-29 DIAGNOSIS — R911 Solitary pulmonary nodule: Secondary | ICD-10-CM

## 2017-06-29 DIAGNOSIS — I2510 Atherosclerotic heart disease of native coronary artery without angina pectoris: Secondary | ICD-10-CM | POA: Insufficient documentation

## 2017-06-29 DIAGNOSIS — R569 Unspecified convulsions: Secondary | ICD-10-CM

## 2017-06-29 DIAGNOSIS — Z7982 Long term (current) use of aspirin: Secondary | ICD-10-CM

## 2017-06-29 DIAGNOSIS — I10 Essential (primary) hypertension: Secondary | ICD-10-CM | POA: Insufficient documentation

## 2017-06-29 DIAGNOSIS — Z85828 Personal history of other malignant neoplasm of skin: Secondary | ICD-10-CM

## 2017-06-29 DIAGNOSIS — Z9889 Other specified postprocedural states: Secondary | ICD-10-CM

## 2017-06-29 DIAGNOSIS — I4891 Unspecified atrial fibrillation: Secondary | ICD-10-CM | POA: Insufficient documentation

## 2017-06-29 DIAGNOSIS — M3500 Sicca syndrome, unspecified: Secondary | ICD-10-CM

## 2017-06-29 DIAGNOSIS — Z87891 Personal history of nicotine dependence: Secondary | ICD-10-CM

## 2017-06-29 DIAGNOSIS — I255 Ischemic cardiomyopathy: Secondary | ICD-10-CM

## 2017-06-29 DIAGNOSIS — E7800 Pure hypercholesterolemia, unspecified: Secondary | ICD-10-CM | POA: Insufficient documentation

## 2017-06-29 DIAGNOSIS — D649 Anemia, unspecified: Secondary | ICD-10-CM

## 2017-06-29 DIAGNOSIS — R0602 Shortness of breath: Secondary | ICD-10-CM | POA: Diagnosis not present

## 2017-06-29 HISTORY — DX: Chronic obstructive pulmonary disease, unspecified: J44.9

## 2017-06-29 LAB — COMPREHENSIVE METABOLIC PANEL
ALT: 20 U/L (ref 14–54)
AST: 24 U/L (ref 15–41)
Albumin: 4 g/dL (ref 3.5–5.0)
Alkaline Phosphatase: 51 U/L (ref 38–126)
Anion gap: 7 (ref 5–15)
BUN: 14 mg/dL (ref 6–20)
CO2: 28 mmol/L (ref 22–32)
Calcium: 9.3 mg/dL (ref 8.9–10.3)
Chloride: 103 mmol/L (ref 101–111)
Creatinine, Ser: 0.62 mg/dL (ref 0.44–1.00)
GFR calc Af Amer: >60 mL/min (ref >60)
GFR calc non Af Amer: >60 mL/min (ref >60)
Glucose, Bld: 98 mg/dL (ref 65–99)
Potassium: 3.7 mmol/L (ref 3.5–5.1)
Sodium: 138 mmol/L (ref 135–145)
Total Bilirubin: 0.7 mg/dL (ref 0.3–1.2)
Total Protein: 6.4 g/dL — ABNORMAL LOW (ref 6.5–8.1)

## 2017-06-29 LAB — URINALYSIS, ROUTINE W REFLEX MICROSCOPIC
Bacteria, UA: NONE SEEN
Bilirubin Urine: NEGATIVE
Glucose, UA: NEGATIVE mg/dL
Ketones, ur: NEGATIVE mg/dL
Leukocytes, UA: NEGATIVE
Nitrite: NEGATIVE
Protein, ur: NEGATIVE mg/dL
Specific Gravity, Urine: 1.008 (ref 1.005–1.030)
pH: 6 (ref 5.0–8.0)

## 2017-06-29 LAB — PROTIME-INR
INR: 2.03
Prothrombin Time: 23.3 seconds — ABNORMAL HIGH (ref 11.4–15.2)

## 2017-06-29 LAB — APTT: aPTT: 34 seconds (ref 24–36)

## 2017-06-29 LAB — SURGICAL PCR SCREEN
MRSA, PCR: NEGATIVE
Staphylococcus aureus: NEGATIVE

## 2017-06-29 LAB — CBC
HCT: 41.4 % (ref 36.0–46.0)
Hemoglobin: 14.2 g/dL (ref 12.0–15.0)
MCH: 30.3 pg (ref 26.0–34.0)
MCHC: 34.3 g/dL (ref 30.0–36.0)
MCV: 88.3 fL (ref 78.0–100.0)
Platelets: 195 10*3/uL (ref 150–400)
RBC: 4.69 MIL/uL (ref 3.87–5.11)
RDW: 14.8 % (ref 11.5–15.5)
WBC: 9.4 10*3/uL (ref 4.0–10.5)

## 2017-06-29 LAB — BLOOD GAS, ARTERIAL
Acid-Base Excess: 1 mmol/L (ref 0.0–2.0)
Bicarbonate: 23.8 mmol/L (ref 20.0–28.0)
Drawn by: 470591
FIO2: 21
O2 Saturation: 90 %
Patient temperature: 98.6
pCO2 arterial: 30.4 mmHg — ABNORMAL LOW (ref 32.0–48.0)
pH, Arterial: 7.506 — ABNORMAL HIGH (ref 7.350–7.450)
pO2, Arterial: 53.9 mmHg — ABNORMAL LOW (ref 83.0–108.0)

## 2017-06-29 NOTE — Telephone Encounter (Signed)
New Message        [facility] Pre-operative Risk Assessment    Request for surgical clearance:  1. What type of surgery is being performed?  Lobectomy- early stage lung cancer   2. When is this surgery scheduled?  07/02/17  3. Are there any medications that need to be held prior to surgery and how long? Stopped coumadin on 06/27/17  4. Name of physician performing surgery?  Dr [name]  5. Dr Caryl Comes cleared her for surgery she just wanted Dr Claiborne Billings to know she was having it   Howie Ill 06/29/2017, 2:42 PM  _________________________________________________________________   (provider comments below)
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
## 2017-06-29 NOTE — Pre-Procedure Instructions (Signed)
[name]  06/29/2017      [facility] [address], [place] AT [address] [place] [address] Phone: [phone] Fax: [phone]    Your procedure is scheduled on Mon. Aug. 20  Report to [facility] Admitting at 5:30 A.M.  Call this number if you have problems the morning of surgery:  [phone]   Remember:  Do not eat food or drink liquids after midnight on sun. Aug. 19   Take these medicines the morning of surgery with A SIP OF WATER : tylenol if needed, flonase if needed, isosorbide (imdur),metoprolol succinate (toprol-xl), nitroglycerine if needed, incruse ellipta              Stop aspirin and coumadin per Dr. [name]              Stop advil, motrin, aleve, ibuprofen, BC Powders, Goody's, vitamins and herbal medicines: fish oil   Do not wear jewelry, make-up or nail polish.  Do not wear lotions, powders, or perfumes, or deoderant.  Do not shave 48 hours prior to surgery.  Men may shave face and neck.  Do not bring valuables to the hospital.  [facility] is not responsible for any belongings or valuables.  Contacts, dentures or bridgework may not be worn into surgery.  Leave your suitcase in the car.  After surgery it may be brought to your room.  For patients admitted to the hospital, discharge time will be determined by your treatment team.  Patients discharged the day of surgery will not be allowed to drive home.    Special instructions:  [place]- Preparing For Surgery  Before surgery, you can play an important role. Because skin is not sterile, your skin needs to be as free of germs as possible. You can reduce the number of germs on your skin by washing with CHG (chlorahexidine gluconate) Soap before surgery.  CHG is an antiseptic cleaner which kills germs and bonds with the skin to continue killing germs even after washing.  Please do not use if you have an allergy  to CHG or antibacterial soaps. If your skin becomes reddened/irritated stop using the CHG.  Do not shave (including legs and underarms) for at least 48 hours prior to first CHG shower. It is OK to shave your face.  Please follow these instructions carefully.   1. Shower the NIGHT BEFORE SURGERY and the MORNING OF SURGERY with CHG.   2. If you chose to wash your hair, wash your hair first as usual with your normal shampoo.  3. After you shampoo, rinse your hair and body thoroughly to remove the shampoo.  4. Use CHG as you would any other liquid soap. You can apply CHG directly to the skin and wash gently with a scrungie or a clean washcloth.   5. Apply the CHG Soap to your body ONLY FROM THE NECK DOWN.  Do not use on open wounds or open sores. Avoid contact with your eyes, ears, mouth and genitals (private parts). Wash genitals (private parts) with your normal soap.  6. Wash thoroughly, paying special attention to the area where your surgery will be performed.  7. Thoroughly rinse your body with warm water from the neck down.  8. DO NOT shower/wash with your normal soap after using and rinsing off the CHG Soap.  9. Pat yourself dry with a CLEAN TOWEL.   10. Wear  CLEAN PAJAMAS   11. Place CLEAN SHEETS on your bed the night of your first shower and DO NOT SLEEP WITH PETS.    Day of Surgery: Do not apply any deodorants/lotions. Please wear clean clothes to the hospital/surgery center.      Please read over the following fact sheets that you were given. Coughing and Deep Breathing, MRSA Information and Surgical Site Infection Prevention

## 2017-06-29 NOTE — Progress Notes (Signed)
PCP: Dr. [name] @ [facility] Cardiologist: Dr. Claiborne Billings and Dr. Caryl Comes  Last dose coumadin was 06/27/17 and pt. Will continue on aspirin but not to take day of surgery per  Thurmond Butts at [place].
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
## 2017-07-02 ENCOUNTER — Inpatient Hospital Stay (HOSPITAL_COMMUNITY): Payer: Medicare Other

## 2017-07-02 ENCOUNTER — Inpatient Hospital Stay (HOSPITAL_COMMUNITY)
Admission: RE | Admit: 2017-07-02 | Discharge: 2017-07-14 | DRG: 164 | Disposition: A | Discharge status: Home Health | Payer: Medicare Other | Source: Ambulatory Visit | Attending: Surgery | Admitting: Surgery

## 2017-07-02 ENCOUNTER — Encounter (HOSPITAL_COMMUNITY)
Admission: RE | Disposition: A | Discharge status: Home Health | Payer: Self-pay | Source: Ambulatory Visit | Attending: Surgery

## 2017-07-02 ENCOUNTER — Encounter (HOSPITAL_COMMUNITY): Payer: Self-pay

## 2017-07-02 ENCOUNTER — Inpatient Hospital Stay (HOSPITAL_COMMUNITY): Payer: Medicare Other | Admitting: Vascular Surgery

## 2017-07-02 ENCOUNTER — Inpatient Hospital Stay (HOSPITAL_COMMUNITY): Payer: Medicare Other | Admitting: Certified Registered Nurse Anesthetist

## 2017-07-02 DIAGNOSIS — I119 Hypertensive heart disease without heart failure: Secondary | ICD-10-CM | POA: Diagnosis present

## 2017-07-02 DIAGNOSIS — M3500 Sicca syndrome, unspecified: Secondary | ICD-10-CM | POA: Diagnosis present

## 2017-07-02 DIAGNOSIS — J9561 Intraoperative hemorrhage and hematoma of a respiratory system organ or structure complicating a respiratory system procedure: Secondary | ICD-10-CM | POA: Diagnosis not present

## 2017-07-02 DIAGNOSIS — E7800 Pure hypercholesterolemia, unspecified: Secondary | ICD-10-CM | POA: Diagnosis present

## 2017-07-02 DIAGNOSIS — I252 Old myocardial infarction: Secondary | ICD-10-CM

## 2017-07-02 DIAGNOSIS — Z87891 Personal history of nicotine dependence: Secondary | ICD-10-CM

## 2017-07-02 DIAGNOSIS — I9762 Postprocedural hemorrhage of a circulatory system organ or structure following other procedure: Secondary | ICD-10-CM | POA: Diagnosis not present

## 2017-07-02 DIAGNOSIS — J942 Hemothorax: Secondary | ICD-10-CM | POA: Diagnosis present

## 2017-07-02 DIAGNOSIS — I4892 Unspecified atrial flutter: Secondary | ICD-10-CM | POA: Diagnosis present

## 2017-07-02 DIAGNOSIS — I481 Persistent atrial fibrillation: Secondary | ICD-10-CM | POA: Diagnosis present

## 2017-07-02 DIAGNOSIS — Z4682 Encounter for fitting and adjustment of non-vascular catheter: Secondary | ICD-10-CM | POA: Diagnosis not present

## 2017-07-02 DIAGNOSIS — R079 Chest pain, unspecified: Secondary | ICD-10-CM | POA: Diagnosis not present

## 2017-07-02 DIAGNOSIS — Z9889 Other specified postprocedural states: Secondary | ICD-10-CM

## 2017-07-02 DIAGNOSIS — R911 Solitary pulmonary nodule: Secondary | ICD-10-CM | POA: Diagnosis not present

## 2017-07-02 DIAGNOSIS — J90 Pleural effusion, not elsewhere classified: Secondary | ICD-10-CM | POA: Diagnosis not present

## 2017-07-02 DIAGNOSIS — R569 Unspecified convulsions: Secondary | ICD-10-CM | POA: Diagnosis present

## 2017-07-02 DIAGNOSIS — Z7901 Long term (current) use of anticoagulants: Secondary | ICD-10-CM

## 2017-07-02 DIAGNOSIS — I25810 Atherosclerosis of coronary artery bypass graft(s) without angina pectoris: Secondary | ICD-10-CM | POA: Diagnosis not present

## 2017-07-02 DIAGNOSIS — J9811 Atelectasis: Secondary | ICD-10-CM | POA: Diagnosis present

## 2017-07-02 DIAGNOSIS — R7303 Prediabetes: Secondary | ICD-10-CM | POA: Diagnosis present

## 2017-07-02 DIAGNOSIS — Z951 Presence of aortocoronary bypass graft: Secondary | ICD-10-CM

## 2017-07-02 DIAGNOSIS — Z85828 Personal history of other malignant neoplasm of skin: Secondary | ICD-10-CM

## 2017-07-02 DIAGNOSIS — R58 Hemorrhage, not elsewhere classified: Secondary | ICD-10-CM

## 2017-07-02 DIAGNOSIS — C3432 Malignant neoplasm of lower lobe, left bronchus or lung: Secondary | ICD-10-CM | POA: Diagnosis not present

## 2017-07-02 DIAGNOSIS — Z79899 Other long term (current) drug therapy: Secondary | ICD-10-CM

## 2017-07-02 DIAGNOSIS — Z7982 Long term (current) use of aspirin: Secondary | ICD-10-CM | POA: Diagnosis not present

## 2017-07-02 DIAGNOSIS — I97638 Postprocedural hematoma of a circulatory system organ or structure following other circulatory system procedure: Secondary | ICD-10-CM | POA: Diagnosis not present

## 2017-07-02 DIAGNOSIS — J449 Chronic obstructive pulmonary disease, unspecified: Secondary | ICD-10-CM | POA: Diagnosis present

## 2017-07-02 DIAGNOSIS — Z902 Acquired absence of lung [part of]: Secondary | ICD-10-CM

## 2017-07-02 DIAGNOSIS — J439 Emphysema, unspecified: Secondary | ICD-10-CM | POA: Diagnosis not present

## 2017-07-02 DIAGNOSIS — I700 Atherosclerosis of aorta: Secondary | ICD-10-CM | POA: Diagnosis not present

## 2017-07-02 DIAGNOSIS — R918 Other nonspecific abnormal finding of lung field: Secondary | ICD-10-CM | POA: Diagnosis not present

## 2017-07-02 DIAGNOSIS — D649 Anemia, unspecified: Secondary | ICD-10-CM | POA: Diagnosis present

## 2017-07-02 DIAGNOSIS — I480 Paroxysmal atrial fibrillation: Secondary | ICD-10-CM | POA: Diagnosis not present

## 2017-07-02 DIAGNOSIS — Z9689 Presence of other specified functional implants: Secondary | ICD-10-CM

## 2017-07-02 DIAGNOSIS — I110 Hypertensive heart disease with heart failure: Secondary | ICD-10-CM | POA: Diagnosis not present

## 2017-07-02 DIAGNOSIS — I5022 Chronic systolic (congestive) heart failure: Secondary | ICD-10-CM | POA: Diagnosis not present

## 2017-07-02 DIAGNOSIS — Z452 Encounter for adjustment and management of vascular access device: Secondary | ICD-10-CM | POA: Diagnosis not present

## 2017-07-02 HISTORY — PX: THORACOTOMY/LOBECTOMY: SHX6116

## 2017-07-02 LAB — BLOOD GAS, ARTERIAL
Acid-Base Excess: 0.7 mmol/L (ref 0.0–2.0)
Bicarbonate: 24.9 mmol/L (ref 20.0–28.0)
Drawn by: 449841
FIO2: 21
O2 Saturation: 98.6 %
Patient temperature: 98.6
pCO2 arterial: 40.7 mmHg (ref 32.0–48.0)
pH, Arterial: 7.404 (ref 7.350–7.450)
pO2, Arterial: 146 mmHg — ABNORMAL HIGH (ref 83.0–108.0)

## 2017-07-02 LAB — GLUCOSE, CAPILLARY
Glucose-Capillary: 221 mg/dL — ABNORMAL HIGH (ref 65–99)
Glucose-Capillary: 158 mg/dL — ABNORMAL HIGH (ref 65–99)

## 2017-07-02 LAB — PROTIME-INR
INR: 1.25
Prothrombin Time: 15.8 seconds — ABNORMAL HIGH (ref 11.4–15.2)

## 2017-07-02 SURGERY — LOBECTOMY, LUNG, OPEN
Anesthesia: General | Laterality: Left

## 2017-07-02 MED ORDER — OMEGA-3-ACID ETHYL ESTERS 1 G PO CAPS
1.0000 g | ORAL_CAPSULE | Freq: Every day | ORAL | Status: DC
  Administered 2017-07-03 – 2017-07-14 (×11): 1 g via ORAL
  Filled 2017-07-02 (×12): qty 1

## 2017-07-02 MED ORDER — ONDANSETRON HCL 4 MG/2ML IJ SOLN
INTRAMUSCULAR | Status: AC
  Filled 2017-07-02: qty 2

## 2017-07-02 MED ORDER — OXYCODONE HCL 5 MG PO TABS
5.0000 mg | ORAL_TABLET | Freq: Once | ORAL | Status: AC | PRN
  Administered 2017-07-02: 5 mg via ORAL

## 2017-07-02 MED ORDER — PROPOFOL 10 MG/ML IV BOLUS
INTRAVENOUS | Status: DC | PRN
  Administered 2017-07-02: 120 mg via INTRAVENOUS
  Administered 2017-07-02: 50 mg via INTRAVENOUS

## 2017-07-02 MED ORDER — ACETAMINOPHEN 160 MG/5ML PO SOLN
1000.0000 mg | Freq: Four times a day (QID) | ORAL | Status: DC
  Administered 2017-07-03 – 2017-07-04 (×4): 1000 mg via ORAL
  Filled 2017-07-02 (×5): qty 40.6

## 2017-07-02 MED ORDER — ASPIRIN EC 81 MG PO TBEC
81.0000 mg | DELAYED_RELEASE_TABLET | Freq: Every day | ORAL | Status: DC
  Administered 2017-07-02 – 2017-07-03 (×2): 81 mg via ORAL
  Filled 2017-07-02 (×2): qty 1

## 2017-07-02 MED ORDER — ONDANSETRON HCL 4 MG/2ML IJ SOLN
INTRAMUSCULAR | Status: DC | PRN
  Administered 2017-07-02: 4 mg via INTRAVENOUS

## 2017-07-02 MED ORDER — METOCLOPRAMIDE HCL 5 MG/ML IJ SOLN
10.0000 mg | Freq: Four times a day (QID) | INTRAMUSCULAR | Status: AC
  Administered 2017-07-02 – 2017-07-03 (×3): 10 mg via INTRAVENOUS
  Filled 2017-07-02 (×3): qty 2

## 2017-07-02 MED ORDER — HYDROMORPHONE HCL 1 MG/ML IJ SOLN
INTRAMUSCULAR | Status: AC
  Filled 2017-07-02: qty 2

## 2017-07-02 MED ORDER — SENNOSIDES-DOCUSATE SODIUM 8.6-50 MG PO TABS
1.0000 | ORAL_TABLET | Freq: Every day | ORAL | Status: DC
  Administered 2017-07-02 – 2017-07-03 (×2): 1 via ORAL
  Filled 2017-07-02 (×2): qty 1

## 2017-07-02 MED ORDER — LEVALBUTEROL HCL 0.63 MG/3ML IN NEBU
0.6300 mg | INHALATION_SOLUTION | Freq: Four times a day (QID) | RESPIRATORY_TRACT | Status: DC
  Administered 2017-07-02: 0.63 mg via RESPIRATORY_TRACT
  Filled 2017-07-02: qty 3

## 2017-07-02 MED ORDER — HYDROMORPHONE HCL 1 MG/ML IJ SOLN
0.2500 mg | INTRAMUSCULAR | Status: DC | PRN
  Administered 2017-07-02: 1 mg via INTRAVENOUS

## 2017-07-02 MED ORDER — FLUTICASONE PROPIONATE 50 MCG/ACT NA SUSP
2.0000 | Freq: Every day | NASAL | Status: DC | PRN
  Filled 2017-07-02: qty 16

## 2017-07-02 MED ORDER — POTASSIUM CHLORIDE 10 MEQ/50ML IV SOLN: 10.0000 meq | Freq: Every day | INTRAVENOUS | Status: DC | PRN

## 2017-07-02 MED ORDER — CHLORHEXIDINE GLUCONATE CLOTH 2 % EX PADS: 6.0000 | MEDICATED_PAD | Freq: Every day | CUTANEOUS | Status: DC

## 2017-07-02 MED ORDER — SIMVASTATIN 20 MG PO TABS
20.0000 mg | ORAL_TABLET | Freq: Every day | ORAL | Status: DC
  Administered 2017-07-03 – 2017-07-13 (×11): 20 mg via ORAL
  Filled 2017-07-02 (×11): qty 1

## 2017-07-02 MED ORDER — CHLORHEXIDINE GLUCONATE CLOTH 2 % EX PADS
6.0000 | MEDICATED_PAD | Freq: Every day | CUTANEOUS | Status: DC
  Administered 2017-07-02 – 2017-07-14 (×10): 6 via TOPICAL

## 2017-07-02 MED ORDER — OXYCODONE HCL 5 MG PO TABS
ORAL_TABLET | ORAL | Status: AC
  Filled 2017-07-02: qty 1

## 2017-07-02 MED ORDER — FENTANYL CITRATE (PF) 250 MCG/5ML IJ SOLN
INTRAMUSCULAR | Status: AC
  Filled 2017-07-02: qty 5

## 2017-07-02 MED ORDER — LABETALOL HCL 5 MG/ML IV SOLN
INTRAVENOUS | Status: DC | PRN
  Administered 2017-07-02: 5 mg via INTRAVENOUS

## 2017-07-02 MED ORDER — LEVALBUTEROL HCL 0.63 MG/3ML IN NEBU
0.6300 mg | INHALATION_SOLUTION | Freq: Two times a day (BID) | RESPIRATORY_TRACT | Status: DC
  Administered 2017-07-03 – 2017-07-04 (×3): 0.63 mg via RESPIRATORY_TRACT
  Filled 2017-07-02 (×3): qty 3

## 2017-07-02 MED ORDER — FENTANYL 40 MCG/ML IV SOLN
INTRAVENOUS | Status: DC
  Administered 2017-07-02: 290 ug via INTRAVENOUS
  Administered 2017-07-02: 135 ug via INTRAVENOUS
  Administered 2017-07-02: 1000 ug via INTRAVENOUS
  Administered 2017-07-03: 60 ug via INTRAVENOUS
  Administered 2017-07-03: 30 ug via INTRAVENOUS
  Administered 2017-07-03: 18.5 mL via INTRAVENOUS
  Administered 2017-07-03 (×2): 30 ug via INTRAVENOUS
  Administered 2017-07-03: 75 ug via INTRAVENOUS
  Administered 2017-07-03: 1000 ug via INTRAVENOUS
  Administered 2017-07-04: 30 ug via INTRAVENOUS
  Administered 2017-07-04: 0 ug via INTRAVENOUS
  Administered 2017-07-04: 45 ug via INTRAVENOUS
  Administered 2017-07-04: 15 ug via INTRAVENOUS
  Filled 2017-07-02 (×2): qty 25

## 2017-07-02 MED ORDER — INSULIN ASPART 100 UNIT/ML ~~LOC~~ SOLN
0.0000 [IU] | SUBCUTANEOUS | Status: DC
  Administered 2017-07-02 (×2): 8 [IU] via SUBCUTANEOUS
  Administered 2017-07-03 – 2017-07-04 (×6): 2 [IU] via SUBCUTANEOUS

## 2017-07-02 MED ORDER — VANCOMYCIN HCL IN DEXTROSE 1-5 GM/200ML-% IV SOLN
INTRAVENOUS | Status: AC
  Filled 2017-07-02: qty 200

## 2017-07-02 MED ORDER — VANCOMYCIN HCL IN DEXTROSE 1-5 GM/200ML-% IV SOLN
1000.0000 mg | INTRAVENOUS | Status: AC
  Administered 2017-07-02: 1000 mg via INTRAVENOUS

## 2017-07-02 MED ORDER — TRAMADOL HCL 50 MG PO TABS: 50.0000 mg | ORAL_TABLET | Freq: Four times a day (QID) | ORAL | Status: DC | PRN

## 2017-07-02 MED ORDER — ONDANSETRON HCL 4 MG/2ML IJ SOLN: 4.0000 mg | Freq: Four times a day (QID) | INTRAMUSCULAR | Status: DC | PRN

## 2017-07-02 MED ORDER — SUGAMMADEX SODIUM 200 MG/2ML IV SOLN
INTRAVENOUS | Status: AC
  Filled 2017-07-02: qty 2

## 2017-07-02 MED ORDER — VITAMIN B-6 100 MG PO TABS
100.0000 mg | ORAL_TABLET | Freq: Every day | ORAL | Status: DC
  Administered 2017-07-03 – 2017-07-14 (×11): 100 mg via ORAL
  Filled 2017-07-02 (×14): qty 1

## 2017-07-02 MED ORDER — DEXAMETHASONE SODIUM PHOSPHATE 10 MG/ML IJ SOLN
INTRAMUSCULAR | Status: DC | PRN
  Administered 2017-07-02: 10 mg via INTRAVENOUS

## 2017-07-02 MED ORDER — OXYCODONE HCL 5 MG PO TABS
5.0000 mg | ORAL_TABLET | ORAL | Status: DC | PRN
  Administered 2017-07-02: 5 mg via ORAL
  Administered 2017-07-02: 10 mg via ORAL
  Administered 2017-07-02 – 2017-07-03 (×3): 5 mg via ORAL
  Administered 2017-07-03: 10 mg via ORAL
  Administered 2017-07-03 (×2): 5 mg via ORAL
  Administered 2017-07-04: 10 mg via ORAL
  Filled 2017-07-02: qty 2
  Filled 2017-07-02 (×6): qty 1
  Filled 2017-07-02 (×2): qty 2

## 2017-07-02 MED ORDER — ACETAMINOPHEN 500 MG PO TABS
1000.0000 mg | ORAL_TABLET | Freq: Four times a day (QID) | ORAL | Status: DC
  Administered 2017-07-02: 500 mg via ORAL
  Administered 2017-07-03 – 2017-07-04 (×2): 1000 mg via ORAL
  Filled 2017-07-02 (×4): qty 2

## 2017-07-02 MED ORDER — LABETALOL HCL 5 MG/ML IV SOLN
INTRAVENOUS | Status: AC
  Filled 2017-07-02: qty 4

## 2017-07-02 MED ORDER — 0.9 % SODIUM CHLORIDE (POUR BTL) OPTIME
TOPICAL | Status: DC | PRN
  Administered 2017-07-02: 2000 mL

## 2017-07-02 MED ORDER — NALOXONE HCL 0.4 MG/ML IJ SOLN: 0.4000 mg | INTRAMUSCULAR | Status: DC | PRN

## 2017-07-02 MED ORDER — DEXAMETHASONE SODIUM PHOSPHATE 10 MG/ML IJ SOLN
INTRAMUSCULAR | Status: AC
  Filled 2017-07-02: qty 1

## 2017-07-02 MED ORDER — SODIUM CHLORIDE 0.9% FLUSH: 10.0000 mL | Freq: Two times a day (BID) | INTRAVENOUS | Status: DC

## 2017-07-02 MED ORDER — PROPOFOL 10 MG/ML IV BOLUS
INTRAVENOUS | Status: AC
  Filled 2017-07-02: qty 40

## 2017-07-02 MED ORDER — DEXTROSE IN LACTATED RINGERS 5 % IV SOLN
INTRAVENOUS | Status: DC
  Administered 2017-07-02: 13:00:00 via INTRAVENOUS

## 2017-07-02 MED ORDER — MIDAZOLAM HCL 2 MG/2ML IJ SOLN
INTRAMUSCULAR | Status: AC
  Filled 2017-07-02: qty 2

## 2017-07-02 MED ORDER — ROCURONIUM BROMIDE 100 MG/10ML IV SOLN
INTRAVENOUS | Status: DC | PRN
  Administered 2017-07-02: 20 mg via INTRAVENOUS
  Administered 2017-07-02: 50 mg via INTRAVENOUS
  Administered 2017-07-02: 20 mg via INTRAVENOUS
  Administered 2017-07-02: 10 mg via INTRAVENOUS

## 2017-07-02 MED ORDER — ROCURONIUM BROMIDE 10 MG/ML (PF) SYRINGE
PREFILLED_SYRINGE | INTRAVENOUS | Status: AC
  Filled 2017-07-02: qty 5

## 2017-07-02 MED ORDER — OXYCODONE HCL 5 MG/5ML PO SOLN: 5.0000 mg | Freq: Once | ORAL | Status: AC | PRN

## 2017-07-02 MED ORDER — SODIUM CHLORIDE 0.9% FLUSH: 9.0000 mL | INTRAVENOUS | Status: DC | PRN

## 2017-07-02 MED ORDER — BISACODYL 5 MG PO TBEC
10.0000 mg | DELAYED_RELEASE_TABLET | Freq: Every day | ORAL | Status: DC
  Administered 2017-07-03: 10 mg via ORAL
  Filled 2017-07-02: qty 2

## 2017-07-02 MED ORDER — DIPHENHYDRAMINE HCL 12.5 MG/5ML PO ELIX: 12.5000 mg | ORAL_SOLUTION | Freq: Four times a day (QID) | ORAL | Status: DC | PRN

## 2017-07-02 MED ORDER — VITAMIN D 1000 UNITS PO TABS
5000.0000 [IU] | ORAL_TABLET | Freq: Every day | ORAL | Status: DC
  Administered 2017-07-03 – 2017-07-14 (×11): 5000 [IU] via ORAL
  Filled 2017-07-02 (×11): qty 5

## 2017-07-02 MED ORDER — SUGAMMADEX SODIUM 200 MG/2ML IV SOLN
INTRAVENOUS | Status: DC | PRN
  Administered 2017-07-02: 150 mg via INTRAVENOUS

## 2017-07-02 MED ORDER — LACTATED RINGERS IV SOLN
INTRAVENOUS | Status: DC | PRN
  Administered 2017-07-02: 07:00:00 via INTRAVENOUS

## 2017-07-02 MED ORDER — CALCIUM CARBONATE-VITAMIN D 500-200 MG-UNIT PO TABS
1.0000 | ORAL_TABLET | Freq: Every day | ORAL | Status: DC
  Administered 2017-07-03 – 2017-07-14 (×11): 1 via ORAL
  Filled 2017-07-02 (×11): qty 1

## 2017-07-02 MED ORDER — SODIUM CHLORIDE 0.9% FLUSH
10.0000 mL | Freq: Two times a day (BID) | INTRAVENOUS | Status: DC
  Administered 2017-07-03 – 2017-07-11 (×11): 10 mL

## 2017-07-02 MED ORDER — DIPHENHYDRAMINE HCL 50 MG/ML IJ SOLN: 12.5000 mg | Freq: Four times a day (QID) | INTRAMUSCULAR | Status: DC | PRN

## 2017-07-02 MED ORDER — ISOSORBIDE MONONITRATE ER 30 MG PO TB24
30.0000 mg | ORAL_TABLET | Freq: Every day | ORAL | Status: DC
  Administered 2017-07-03 – 2017-07-14 (×12): 30 mg via ORAL
  Filled 2017-07-02 (×14): qty 1

## 2017-07-02 MED ORDER — UMECLIDINIUM BROMIDE 62.5 MCG/INH IN AEPB
1.0000 | INHALATION_SPRAY | Freq: Every day | RESPIRATORY_TRACT | Status: DC
  Administered 2017-07-03 – 2017-07-09 (×7): 1 via RESPIRATORY_TRACT
  Filled 2017-07-02 (×2): qty 7

## 2017-07-02 MED ORDER — LEVOFLOXACIN IN D5W 750 MG/150ML IV SOLN
750.0000 mg | INTRAVENOUS | Status: AC
  Administered 2017-07-03: 750 mg via INTRAVENOUS
  Filled 2017-07-02: qty 150

## 2017-07-02 MED ORDER — EZETIMIBE 10 MG PO TABS
10.0000 mg | ORAL_TABLET | Freq: Every day | ORAL | Status: DC
  Administered 2017-07-03 – 2017-07-14 (×11): 10 mg via ORAL
  Filled 2017-07-02 (×12): qty 1

## 2017-07-02 MED ORDER — FENTANYL CITRATE (PF) 100 MCG/2ML IJ SOLN
INTRAMUSCULAR | Status: DC | PRN
  Administered 2017-07-02: 100 ug via INTRAVENOUS
  Administered 2017-07-02 (×5): 50 ug via INTRAVENOUS
  Administered 2017-07-02: 100 ug via INTRAVENOUS
  Administered 2017-07-02 (×2): 50 ug via INTRAVENOUS

## 2017-07-02 MED ORDER — MIDAZOLAM HCL 5 MG/5ML IJ SOLN
INTRAMUSCULAR | Status: DC | PRN
Start: 2017-07-02 — End: 2017-07-02
  Administered 2017-07-02 (×2): 1 mg via INTRAVENOUS

## 2017-07-02 MED ORDER — PHENYLEPHRINE HCL 10 MG/ML IJ SOLN
INTRAVENOUS | Status: DC | PRN
  Administered 2017-07-02: 10 ug/min via INTRAVENOUS
  Administered 2017-07-02: 20 ug/min via INTRAVENOUS

## 2017-07-02 MED ORDER — SODIUM CHLORIDE 0.9% FLUSH
10.0000 mL | Freq: Two times a day (BID) | INTRAVENOUS | Status: DC
  Administered 2017-07-03 – 2017-07-11 (×10): 10 mL

## 2017-07-02 SURGICAL SUPPLY — 76 items
BIT DRILL 7/64X5 DISP (BIT) ×2 IMPLANT
BLADE SURG 11 STRL SS (BLADE) IMPLANT
CANISTER SUCT 3000ML PPV (MISCELLANEOUS) ×4 IMPLANT
CATH KIT ON Q 5IN SLV (PAIN MANAGEMENT) IMPLANT
CATH THORACIC 28FR (CATHETERS) IMPLANT
CATH THORACIC 36FR (CATHETERS) IMPLANT
CATH THORACIC 36FR RT ANG (CATHETERS) IMPLANT
CLIP VESOCCLUDE MED 24/CT (CLIP) ×2 IMPLANT
CONT SPEC 4OZ CLIKSEAL STRL BL (MISCELLANEOUS) ×4 IMPLANT
COVER SURGICAL LIGHT HANDLE (MISCELLANEOUS) IMPLANT
DERMABOND ADVANCED (GAUZE/BANDAGES/DRESSINGS) ×1
DERMABOND ADVANCED .7 DNX12 (GAUZE/BANDAGES/DRESSINGS) ×1 IMPLANT
DRAPE LAPAROSCOPIC ABDOMINAL (DRAPES) ×2 IMPLANT
DRAPE WARM FLUID 44X44 (DRAPE) IMPLANT
ELECT REM PT RETURN 9FT ADLT (ELECTROSURGICAL) ×2
ELECTRODE REM PT RTRN 9FT ADLT (ELECTROSURGICAL) ×1 IMPLANT
GAUZE SPONGE 4X4 12PLY STRL (GAUZE/BANDAGES/DRESSINGS) ×2 IMPLANT
GAUZE SPONGE 4X4 12PLY STRL LF (GAUZE/BANDAGES/DRESSINGS) ×2 IMPLANT
GLOVE BIO SURGEON STRL SZ 6 (GLOVE) ×2 IMPLANT
GLOVE BIO SURGEON STRL SZ 6.5 (GLOVE) ×2 IMPLANT
GLOVE BIO SURGEON STRL SZ7 (GLOVE) ×2 IMPLANT
GLOVE BIOGEL PI IND STRL 6 (GLOVE) ×1 IMPLANT
GLOVE BIOGEL PI IND STRL 6.5 (GLOVE) ×2 IMPLANT
GLOVE BIOGEL PI INDICATOR 6 (GLOVE) ×1
GLOVE BIOGEL PI INDICATOR 6.5 (GLOVE) ×2
GLOVE EUDERMIC 7 POWDERFREE (GLOVE) IMPLANT
GOWN STRL REUS W/ TWL LRG LVL3 (GOWN DISPOSABLE) ×3 IMPLANT
GOWN STRL REUS W/ TWL XL LVL3 (GOWN DISPOSABLE) ×1 IMPLANT
GOWN STRL REUS W/TWL LRG LVL3 (GOWN DISPOSABLE) ×3
GOWN STRL REUS W/TWL XL LVL3 (GOWN DISPOSABLE) ×1
HANDLE STAPLE ENDO GIA SHORT (STAPLE) ×1
KIT BASIN OR (CUSTOM PROCEDURE TRAY) ×2 IMPLANT
KIT ROOM TURNOVER OR (KITS) ×2 IMPLANT
NS IRRIG 1000ML POUR BTL (IV SOLUTION) ×4 IMPLANT
PACK CHEST (CUSTOM PROCEDURE TRAY) ×2 IMPLANT
PAD ARMBOARD 7.5X6 YLW CONV (MISCELLANEOUS) ×4 IMPLANT
PASSER SUT SWANSON 36MM LOOP (INSTRUMENTS) ×2 IMPLANT
RELOAD EGIA 60 MED/THCK PURPLE (STAPLE) ×2 IMPLANT
RELOAD EGIA TRIS TAN 45 CVD (STAPLE) ×8 IMPLANT
RELOAD STAPLE TA30 4.8 GRN (STAPLE) ×2 IMPLANT
SEALANT SURG COSEAL 4ML (VASCULAR PRODUCTS) IMPLANT
SEALANT SURG COSEAL 8ML (VASCULAR PRODUCTS) IMPLANT
SOLUTION ANTI FOG 6CC (MISCELLANEOUS) ×2 IMPLANT
SPECIMEN JAR MEDIUM (MISCELLANEOUS) ×4 IMPLANT
STAPLER ENDO GIA 12MM SHORT (STAPLE) ×1 IMPLANT
STAPLER TA30 4.8 NON-ABS (STAPLE) ×2 IMPLANT
SUT PROLENE 3 0 SH DA (SUTURE) IMPLANT
SUT PROLENE 4 0 RB 1 (SUTURE)
SUT PROLENE 4-0 RB1 .5 CRCL 36 (SUTURE) IMPLANT
SUT SILK  1 MH (SUTURE) ×2
SUT SILK 1 MH (SUTURE) ×2 IMPLANT
SUT SILK 1 TIES 10X30 (SUTURE) IMPLANT
SUT SILK 2 0SH CR/8 30 (SUTURE) IMPLANT
SUT SILK 3 0 SH CR/8 (SUTURE) IMPLANT
SUT VIC AB 1 CTX 36 (SUTURE) ×2
SUT VIC AB 1 CTX36XBRD ANBCTR (SUTURE) ×1 IMPLANT
SUT VIC AB 2 TP1 27 (SUTURE) ×2 IMPLANT
SUT VIC AB 2-0 CT1 27 (SUTURE)
SUT VIC AB 2-0 CT1 TAPERPNT 27 (SUTURE) IMPLANT
SUT VIC AB 2-0 CTX 36 (SUTURE) ×2 IMPLANT
SUT VIC AB 2-0 UR6 27 (SUTURE) IMPLANT
SUT VIC AB 3-0 MH 27 (SUTURE) IMPLANT
SUT VIC AB 3-0 SH 27 (SUTURE)
SUT VIC AB 3-0 SH 27X BRD (SUTURE) IMPLANT
SUT VIC AB 3-0 X1 27 (SUTURE) ×2 IMPLANT
SYSTEM SAHARA CHEST DRAIN ATS (WOUND CARE) ×2 IMPLANT
TAPE CLOTH SURG 4X10 WHT LF (GAUZE/BANDAGES/DRESSINGS) ×2 IMPLANT
TAPE UMBILICAL 1/8 X36 TWILL (MISCELLANEOUS) IMPLANT
TAPE UMBILICAL COTTON 1/8X30 (MISCELLANEOUS) ×2 IMPLANT
TIP APPLICATOR SPRAY EXTEND 16 (VASCULAR PRODUCTS) IMPLANT
TOWEL OR 17X24 6PK STRL BLUE (TOWEL DISPOSABLE) ×2 IMPLANT
TOWEL OR 17X26 10 PK STRL BLUE (TOWEL DISPOSABLE) ×2 IMPLANT
TRAP SPECIMEN MUCOUS 40CC (MISCELLANEOUS) IMPLANT
TRAY FOLEY W/METER SILVER 16FR (SET/KITS/TRAYS/PACK) ×2 IMPLANT
TUNNELER SHEATH ON-Q 11GX8 DSP (PAIN MANAGEMENT) IMPLANT
WATER STERILE IRR 1000ML POUR (IV SOLUTION) ×4 IMPLANT

## 2017-07-02 NOTE — Telephone Encounter (Signed)
Patient cleared for surgery by Dr. Caryl Comes.  Coumadin held for surgery.

## 2017-07-02 NOTE — Anesthesia Procedure Notes (Signed)
Arterial Line Insertion Start/End8/20/2018 6:55 AM, 07/02/2017 7:00 AM Performed by: Candis Shine, CRNA  Patient location: Pre-op. Preanesthetic checklist: patient identified, IV checked, site marked, risks and benefits discussed, surgical consent, monitors and equipment checked, pre-op evaluation, timeout performed and anesthesia consent Lidocaine 1% used for infiltration Right, radial was placed Catheter size: 20 Fr Hand hygiene performed  and maximum sterile barriers used   Attempts: 2 Procedure performed without using ultrasound guided technique. Following insertion, dressing applied. Post procedure assessment: normal and unchanged  Patient tolerated the procedure well with no immediate complications.

## 2017-07-02 NOTE — Interval H&P Note (Signed)
History and Physical Interval Note:  07/02/2017 6:39 AM  [name]  has presented today for surgery, with the diagnosis of LLL PULMONARY NODULE  The various methods of treatment have been discussed with the patient and family. After consideration of risks, benefits and other options for treatment, the patient has consented to  Procedure(s): THORACOTOMY/LEFT LOWER LOBECTOMY (Left) as a surgical intervention .  The patient's history has been reviewed, patient examined, no change in status, stable for surgery.  I have reviewed the patient's chart and labs.  Questions were answered to the patient's satisfaction.     Gaye Pollack

## 2017-07-02 NOTE — Anesthesia Preprocedure Evaluation (Addendum)
Anesthesia Evaluation  Patient identified by MRN, date of birth, ID band Patient awake    Reviewed: Allergy & Precautions, NPO status , Patient's Chart, lab work & pertinent test results  Airway Mallampati: II  TM Distance: >3 FB Neck ROM: Full    Dental no notable dental hx.    Pulmonary shortness of breath, sleep apnea , COPD, former smoker,    breath sounds clear to auscultation       Cardiovascular hypertension, + CAD, + Past MI, + CABG and + Peripheral Vascular Disease  + dysrhythmias + Valvular Problems/Murmurs  Rhythm:Regular Rate:Normal     Neuro/Psych    GI/Hepatic negative GI ROS, Neg liver ROS,   Endo/Other  negative endocrine ROS  Renal/GU negative Renal ROS     Musculoskeletal   Abdominal   Peds  Hematology   Anesthesia Other Findings   Reproductive/Obstetrics                            Anesthesia Physical Anesthesia Plan  ASA: III  Anesthesia Plan: General   Post-op Pain Management:    Induction: Intravenous  PONV Risk Score and Plan: 3 and 4 or greater and Ondansetron, Dexamethasone, Midazolam, Propofol infusion and Treatment may vary due to age or medical condition  Airway Management Planned: Double Lumen EBT  Additional Equipment: CVP and Arterial line  Intra-op Plan:   Post-operative Plan: Extubation in OR and Possible Post-op intubation/ventilation  Informed Consent: I have reviewed the patients History and Physical, chart, labs and discussed the procedure including the risks, benefits and alternatives for the proposed anesthesia with the patient or authorized representative who has indicated his/her understanding and acceptance.   Dental advisory given  Plan Discussed with: CRNA  Anesthesia Plan Comments:        Anesthesia Quick Evaluation

## 2017-07-02 NOTE — Brief Op Note (Signed)
07/02/2017  10:15 AM  PATIENT:  [name]  71 y.o. female  PRE-OPERATIVE DIAGNOSIS:  LLL PULMONARY NODULE  POST-OPERATIVE DIAGNOSIS:  LLL PULMONARY NODULE  PROCEDURE:  MUSCLE SPARING LEFT THORACOTOMY/LEFT LOWER LOBECTOMY, LYMPH NODE DISSECTION  SURGEON:  Surgeon(s) and Role:    * Bartle, Fernande Boyden, MD - Primary  PHYSICIAN ASSISTANT: Lars Pinks PA-C  ANESTHESIA:   general  EBL:  Total I/O In: -  Out: 325 [Urine:250; Blood:75]  BLOOD ADMINISTERED:none  DRAINS: 22 French chest tube placed in the left pleural space    SPECIMEN:  Source of Specimen:  LLL, lymph nodes  DISPOSITION OF SPECIMEN:  PATHOLOGY  COUNTS CORRECT:  YES  DICTATION: .Dragon Dictation  PLAN OF CARE: Admit to inpatient   PATIENT DISPOSITION:  PACU - hemodynamically stable.   Delay start of Pharmacological VTE agent (>24hrs) due to surgical blood loss or risk of bleeding: yes

## 2017-07-02 NOTE — Anesthesia Procedure Notes (Signed)
Procedure Name: Intubation Date/Time: 07/02/2017 7:52 AM Performed by: Candis Shine Pre-anesthesia Checklist: Patient identified, Emergency Drugs available, Suction available and Patient being monitored Patient Re-evaluated:Patient Re-evaluated prior to induction Oxygen Delivery Method: Circle System Utilized Preoxygenation: Pre-oxygenation with 100% oxygen Induction Type: IV induction Ventilation: Mask ventilation without difficulty and Oral airway inserted - appropriate to patient size Laryngoscope Size: Glidescope and 3 Grade View: Grade I Endobronchial tube: Left, Double lumen EBT, EBT position confirmed by auscultation and EBT position confirmed by fiberoptic bronchoscope and 37 Fr Number of attempts: 1 Airway Equipment and Method: Stylet,  Oral airway,  Video-laryngoscopy and Fiberoptic brochoscope Placement Confirmation: ETT inserted through vocal cords under direct vision,  positive ETCO2 and breath sounds checked- equal and bilateral Secured at: 27 cm Tube secured with: Tape Dental Injury: Teeth and Oropharynx as per pre-operative assessment  Comments: Elective glidescope d/t previous grade III view.

## 2017-07-02 NOTE — Progress Notes (Signed)
      [address]       [place] [address]             [phone]       S/p Left lower lobectomy  BP (!) 141/68   Pulse (!) 59   Temp 98.1 F (36.7 C) (Oral)   Resp 16   Ht 5\' 3"  (1.6 m)   Wt 153 lb (69.4 kg)   SpO2 96%   BMI 27.10 kg/m    Intake/Output Summary (Last 24 hours) at 07/02/17 1751 Last data filed at 07/02/17 1700  Gross per 24 hour  Intake            552.5 ml  Output              485 ml  Net             67.5 ml    Doing well early postop  Remo Lipps C. Roxan Hockey, MD [facility] [phone]

## 2017-07-02 NOTE — Brief Op Note (Signed)
07/02/2017  10:16 AM  PATIENT:  [name]  71 y.o. female  PRE-OPERATIVE DIAGNOSIS:  LLL PULMONARY NODULE  POST-OPERATIVE DIAGNOSIS:  LLL PULMONARY NODULE  PROCEDURE:  Procedure(s): THORACOTOMY/LEFT LOWER LOBECTOMY (Left)  SURGEON:  Surgeon(s) and Role:    * Jovanne Riggenbach, Fernande Boyden, MD - Primary  PHYSICIAN ASSISTANT: Lars Pinks, PA-C    ANESTHESIA:   general  EBL:  Total I/O In: -  Out: 325 [Urine:250; Blood:75]  BLOOD ADMINISTERED:none  DRAINS: one 81F Chest Tube(s) in the left pleural space   LOCAL MEDICATIONS USED:  NONE  SPECIMEN:  Source of Specimen:  left lower lobe  DISPOSITION OF SPECIMEN:  PATHOLOGY  COUNTS:  YES  TOURNIQUET:  * No tourniquets in log *  DICTATION: .Note written in EPIC  PLAN OF CARE: Admit to inpatient   PATIENT DISPOSITION:  PACU - hemodynamically stable.   Delay start of Pharmacological VTE agent (>24hrs) due to surgical blood loss or risk of bleeding: yes

## 2017-07-02 NOTE — Anesthesia Procedure Notes (Signed)
Central Venous Catheter Insertion Performed by: Suzette Battiest, anesthesiologist Start/End8/20/2018 7:05 AM, 07/02/2017 7:20 AM Patient location: Pre-op. Preanesthetic checklist: patient identified, IV checked, site marked, risks and benefits discussed, surgical consent, monitors and equipment checked, pre-op evaluation, timeout performed and anesthesia consent Position: Trendelenburg Lidocaine 1% used for infiltration and patient sedated Hand hygiene performed , maximum sterile barriers used  and Seldinger technique used Catheter size: 8 Fr Total catheter length 16. Central line was placed.Double lumen Procedure performed using ultrasound guided technique. Ultrasound Notes:anatomy identified, needle tip was noted to be adjacent to the nerve/plexus identified, no ultrasound evidence of intravascular and/or intraneural injection and image(s) printed for medical record Attempts: 1 Following insertion, dressing applied, line sutured and Biopatch. Post procedure assessment: blood return through all ports  Patient tolerated the procedure well with no immediate complications.

## 2017-07-02 NOTE — Transfer of Care (Signed)
Immediate Anesthesia Transfer of Care Note  Patient: [name]  Procedure(s) Performed: Procedure(s): THORACOTOMY/LEFT LOWER LOBECTOMY (Left)  Patient Location: PACU  Anesthesia Type:General  Level of Consciousness: awake, alert  and oriented  Airway & Oxygen Therapy: Patient Spontanous Breathing and Patient connected to nasal cannula oxygen  Post-op Assessment: Report given to RN and Post -op Vital signs reviewed and stable  Post vital signs: Reviewed and stable  Last Vitals:  Vitals:   07/02/17 0546  BP: (!) 176/67  Pulse: (!) 53  Resp: 18  Temp: 36.4 C  SpO2: 98%    Last Pain:  Vitals:   07/02/17 0546  TempSrc: Oral         Complications: No apparent anesthesia complications

## 2017-07-02 NOTE — Op Note (Signed)
CARDIOTHORACIC SURGERY OPERATIVE NOTE 07/02/2017 [name] [id number]  Surgeon:  Gaye Pollack, MD  First Assistant: Lars Pinks, PA-C    Preoperative Diagnosis:  Left lower lobe lung nodule  Postoperative Diagnosis:  Same   Procedure:  1.  Left muscle-sparing thoracotomy 2.  Left lower lobectomy   Anesthesia:  General Endotracheal   Clinical History/Surgical Indication:  The patient is a 71 year old woman with a history of prior smoking until 2008 ( 40 pk-yrs), CAD s/p CABG by me in 2008, HTN and hypercholesterolemia, PAF and atrial flutter s/p DCCV in 12/2016 on chronic Coumadin who was seen by Dr. Lake Bells in February for shortness of breath. She had PFT's showing moderate airflow obstruction and had a lung cancer screening CT on 02/20/2017 which showed an 8.8 mm LLL lung nodule. A follow up CT on 05/23/2017 showed an increase in the size of this nodule to 10.9 mm. There was no mediastinal or hilar adenopathy. She had a PET scan on 05/30/2017 which showed the nodule to be hypermetabolic with an SUV max of 4.3. There was no other hypermetabolic activity. She says that her breathing has been improved with an Incruse inhaler.   She underwent a cardiac workup this past spring due to her shortness of breath and cardiac history. A nuclear stress test on 01/09/2017 was negative for ischemia. An exercise tolerance test on 03/01/2017 was negative. Her last echo in 11/2015 showed normal LV systolic function.  She has an enlarging hypermetabolic spiculated nodule in the LLL of the lung that is suspicious for bronchogenic carcinoma. I have personally reviewed and interpreted her CT and PET scans and have reviewed the images with her. The nodule is 10.9 mm and has enlarged over a 3 month period with hypermetabolic activity. There is no other hypermetabolic activity to suggest metastatic disease. Her PFT's show moderate COPD but I think are adequate to allow left lower lobectomy. The  location of this lesion is probably not amenable to wedge resection and XRT would be an inferior treatment for her. She does have a cardiac history as outlined above but has had a recent nuclear stress and ETT both of which were negative. I discussed the options of surgical resection and XRT with her and she would like to proceed with surgery. I don't think a preop needle biopsy is necessary because I think the likelihood that this lesion is cancer is 99%. I discussed the operative procedure with her including alternatives, benefits and risks including but not limited to bleeding, infection, prolonged air leak, bronchial stump complications, respiratory failure, oxygen dependence and recurrent cancer. She understands and agrees to proceed.  Preparation:  The patient was seen in the preoperative holding area and the correct patient, correct operation were confirmed with the patient after reviewing the medical record and xrays. The left side of the chest was signed by me. The consent was signed by me. Preoperative antibiotics were given. A radial arterial line was placed by the anesthesia team. The patient was taken back to the operating room and positioned supine on the operating room table. After being placed under general endotracheal anesthesia by the anesthesia team using a double lumen tube,  a foley catheter was placed. Lower extremity SCD's were placed.  She was turned into the right lateral decubitus position with the left side up. The chest was prepped with betadine soap and solution and draped in the usual sterile manner. A surgical time-out was taken and the correct patient and operative procedure and  operative side were confirmed with the nursing and anesthesia staff.   Left thoracotomy and left lower lobectomy:  The chest was entered through a lateral muscle-sparing thoracotomy incision. The pleural space was entered through the 5th ICS. Then the interlobar pulmonary artery was identified. The  two branchs to the superior segment were mobilized and divided using a vascular stapler. The trunk to the basilar segments was mobilized and divided using vascular staplers. The inferior pulmonary was divided using vascular staplers. The fissure was nearly complete with only a small area between the superior segment and upper lobe that were joined. This was divided with a stapler. The lower lobe bronchus was encircled with a tape and a TA-30 stapler with 4.8 mm staples was applied. It was closed around the bronchus. The left lung was inflated and the upper lobe inflated without difficulty. The stapler was fired and the lower lobe bronchus divided distal to the stapler. The superior segmental bronchus was divided separately using a TA-30 stapler in the same manner. The lower lobe was sent to pathology.  The chest was filled with saline and the upper lobe inflated to a pressure of 30 cm water. There was no air leak from the bronchial stump or upper lobe.   Completion:  A 28 F chest tube was placed in the pleural space posteriorly. The ribs were reapproximated with # 2 vicryl pericostal sutures with the sutures placed through holes drilled in the 6th rib and placed around the 5th rib. The muscles were returned to their normal position.  The subcutaneous tissue was closed with 2-0 vicryl suture and the skin with 3-0 vicryl subcuticular suture. The sponge, needle, and instrument counts were correct according to the nurses. Dry sterile dressings were applied and the chest tubes were connected to pleurevac suction. The patient was turned into the supine position, extubated, and transferred to the PACU in satisfactory and stable condition.

## 2017-07-02 NOTE — Anesthesia Postprocedure Evaluation (Signed)
Anesthesia Post Note  Patient: [name]  Procedure(s) Performed: Procedure(s) (LRB): THORACOTOMY/LEFT LOWER LOBECTOMY (Left)     Patient location during evaluation: PACU Anesthesia Type: General Level of consciousness: awake, sedated and oriented Pain management: pain level controlled Vital Signs Assessment: post-procedure vital signs reviewed and stable Respiratory status: spontaneous breathing, nonlabored ventilation, respiratory function stable and patient connected to nasal cannula oxygen Cardiovascular status: blood pressure returned to baseline and stable Postop Assessment: no signs of nausea or vomiting Anesthetic complications: no    Last Vitals:  Vitals:   07/02/17 1215 07/02/17 1300  BP:    Pulse:  (!) 53  Resp: 16 14  Temp:    SpO2:  97%    Last Pain:  Vitals:   07/02/17 1215  TempSrc:   PainSc: 4                  Laquan Ludden,JAMES TERRILL

## 2017-07-03 ENCOUNTER — Inpatient Hospital Stay (HOSPITAL_COMMUNITY): Payer: Medicare Other

## 2017-07-03 ENCOUNTER — Encounter (HOSPITAL_COMMUNITY): Payer: Self-pay | Admitting: Surgery

## 2017-07-03 LAB — BLOOD GAS, ARTERIAL
Acid-Base Excess: 0.9 mmol/L (ref 0.0–2.0)
Bicarbonate: 25.3 mmol/L (ref 20.0–28.0)
Drawn by: 511911
O2 Content: 2 L/min
O2 Saturation: 98.9 %
Patient temperature: 98.6
pCO2 arterial: 43 mmHg (ref 32.0–48.0)
pH, Arterial: 7.388 (ref 7.350–7.450)
pO2, Arterial: 135 mmHg — ABNORMAL HIGH (ref 83.0–108.0)

## 2017-07-03 LAB — BASIC METABOLIC PANEL
Anion gap: 4 — ABNORMAL LOW (ref 5–15)
BUN: 8 mg/dL (ref 6–20)
CO2: 26 mmol/L (ref 22–32)
Calcium: 7.9 mg/dL — ABNORMAL LOW (ref 8.9–10.3)
Chloride: 106 mmol/L (ref 101–111)
Creatinine, Ser: 0.59 mg/dL (ref 0.44–1.00)
GFR calc Af Amer: >60 mL/min (ref >60)
GFR calc non Af Amer: >60 mL/min (ref >60)
Glucose, Bld: 161 mg/dL — ABNORMAL HIGH (ref 65–99)
Potassium: 4 mmol/L (ref 3.5–5.1)
Sodium: 136 mmol/L (ref 135–145)

## 2017-07-03 LAB — GLUCOSE, CAPILLARY
Glucose-Capillary: 241 mg/dL — ABNORMAL HIGH (ref 65–99)
Glucose-Capillary: 155 mg/dL — ABNORMAL HIGH (ref 65–99)
Glucose-Capillary: 159 mg/dL — ABNORMAL HIGH (ref 65–99)
Glucose-Capillary: 82 mg/dL (ref 65–99)
Glucose-Capillary: 102 mg/dL — ABNORMAL HIGH (ref 65–99)
Glucose-Capillary: 145 mg/dL — ABNORMAL HIGH (ref 65–99)

## 2017-07-03 LAB — CBC
HCT: 32.6 % — ABNORMAL LOW (ref 36.0–46.0)
Hemoglobin: 10.9 g/dL — ABNORMAL LOW (ref 12.0–15.0)
MCH: 28.7 pg (ref 26.0–34.0)
MCHC: 33.4 g/dL (ref 30.0–36.0)
MCV: 85.8 fL (ref 78.0–100.0)
Platelets: 159 10*3/uL (ref 150–400)
RBC: 3.8 MIL/uL — ABNORMAL LOW (ref 3.87–5.11)
RDW: 14.1 % (ref 11.5–15.5)
WBC: 12.8 10*3/uL — ABNORMAL HIGH (ref 4.0–10.5)

## 2017-07-03 MED ORDER — ENOXAPARIN SODIUM 40 MG/0.4ML ~~LOC~~ SOLN
40.0000 mg | SUBCUTANEOUS | Status: DC
  Administered 2017-07-03: 40 mg via SUBCUTANEOUS
  Filled 2017-07-03: qty 0.4

## 2017-07-03 NOTE — Care Management Note (Signed)
Case Management Note  Patient Details  Name: SEJAL COFIELD MRN: [id number] Date of Birth: 06-14-1946  Subjective/Objective:   From home alone, POD 1 thoracotomy/ left lower lobectomy , has chest tube to water seal, advancing diet.                 Action/Plan: NCM will follow for dc needs.   Expected Discharge Date:                  Expected Discharge Plan:  [place]  In-House Referral:     Discharge planning Services  CM Consult  Post Acute Care Choice:    Choice offered to:     DME Arranged:    DME Agency:     HH Arranged:    [place] Agency:     Status of Service:  In process, will continue to follow  If discussed at Long Length of Stay Meetings, dates discussed:    Additional Comments:  Zenon Mayo, RN 07/03/2017, 3:23 PM

## 2017-07-03 NOTE — Progress Notes (Signed)
1 Day Post-Op Procedure(s) (LRB): THORACOTOMY/LEFT LOWER LOBECTOMY (Left) Subjective:  No complaints. Slept some. Pain under control. Hungry  Objective: Vital signs in last 24 hours: Temp:  [97.3 F (36.3 C)-98.1 F (36.7 C)] 97.9 F (36.6 C) (08/21 0300) Pulse Rate:  [50-67] 53 (08/21 0700) Cardiac Rhythm: Sinus bradycardia (08/21 0400) Resp:  [7-22] 10 (08/21 0700) BP: (100-149)/(43-99) 100/43 (08/21 0700) SpO2:  [95 %-99 %] 98 % (08/21 0700) Arterial Line BP: (107-183)/(43-97) 111/43 (08/21 0700) Weight:  [69.4 kg (153 lb)] 69.4 kg (153 lb) (08/20 1416)  Hemodynamic parameters for last 24 hours:    Intake/Output from previous day: 08/20 0701 - 08/21 0700 In: 1768.5 [P.O.:480; I.V.:1288.5] Out: 1795 [Urine:1710; Blood:75; Chest Tube:10] Intake/Output this shift: No intake/output data recorded.  General appearance: alert and cooperative Neurologic: intact Heart: regular rate and rhythm, S1, S2 normal, no murmur, click, rub or gallop Lungs: clear to auscultation bilaterally Abdomen: soft, non-tender; bowel sounds normal; no masses,  no organomegaly Extremities: extremities normal, atraumatic, no cyanosis or edema Wound: dressing dry Chest tube has no air leak, minimal drainage. Lab Results:  Recent Labs  07/03/17 0328  WBC 12.8*  HGB 10.9*  HCT 32.6*  PLT 159   BMET:  Recent Labs  07/03/17 0328  NA 136  K 4.0  CL 106  CO2 26  GLUCOSE 161*  BUN 8  CREATININE 0.59  CALCIUM 7.9*    PT/INR:  Recent Labs  07/02/17 0625  LABPROT 15.8*  INR 1.25   ABG    Component Value Date/Time   PHART 7.388 07/03/2017 0340   HCO3 25.3 07/03/2017 0340   TCO2 28 10/06/2007 0251   ACIDBASEDEF 6.0 (H) 09/16/2007 2215   O2SAT 98.9 07/03/2017 0340   CBG (last 3)   Recent Labs  07/02/17 2119 07/02/17 2306 07/03/17 0356  GLUCAP 221* 158* 155*    Assessment/Plan: S/P Procedure(s) (LRB): THORACOTOMY/LEFT LOWER LOBECTOMY (Left)  POD1. Moderate obstructive  defect and severe diffusion defect on PFT's Doing well so far Chest tube to water seal and plan to remove tomorrow. DC arterial line. OOB, ambulate, IS Advance diet.   LOS: 1 day    [name] 07/03/2017

## 2017-07-03 NOTE — Progress Notes (Signed)
A-line DC'd per MD order and protocol, patient up to chair, call bell in reach, daughter at bedside, will continue to monitor.  Rowe Pavy, RN

## 2017-07-03 NOTE — Progress Notes (Signed)
Patient walked approximately 295 ft around the unit.  Patient tolerated well.  Vitals stable- no complaint of pain.  Patient now up in chair waiting for dinner.

## 2017-07-03 NOTE — Progress Notes (Signed)
4 ml of fentanyl wasted when syringe exchanged. Gerald Stabs [facility] witnessed.

## 2017-07-03 NOTE — Progress Notes (Signed)
Patient ambulated in hallway standby assist with front wheel walker, patient ambulated about 150 ft, patient back to bed with call bell in reach, encouraged PCA for pain management, will continue to monitor.  Rowe Pavy, RN

## 2017-07-03 NOTE — Progress Notes (Signed)
CTS PM rounds  Vitals:   07/03/17 1800 07/03/17 1900  BP: (!) 151/59 (!) 129/51  Pulse: 62 63  Resp: (!) 21 17  Temp:    SpO2: 91% 91%   No air leak from tubes on water seal Doing well

## 2017-07-04 ENCOUNTER — Inpatient Hospital Stay (HOSPITAL_COMMUNITY): Payer: Medicare Other | Admitting: Certified Registered Nurse Anesthetist

## 2017-07-04 ENCOUNTER — Inpatient Hospital Stay (HOSPITAL_COMMUNITY): Payer: Medicare Other

## 2017-07-04 ENCOUNTER — Encounter (HOSPITAL_COMMUNITY): Payer: Self-pay | Admitting: Certified Registered Nurse Anesthetist

## 2017-07-04 ENCOUNTER — Encounter (HOSPITAL_COMMUNITY)
Admission: RE | Disposition: A | Discharge status: Home Health | Payer: Self-pay | Source: Ambulatory Visit | Attending: Surgery

## 2017-07-04 DIAGNOSIS — I97638 Postprocedural hematoma of a circulatory system organ or structure following other circulatory system procedure: Secondary | ICD-10-CM

## 2017-07-04 DIAGNOSIS — Z9889 Other specified postprocedural states: Secondary | ICD-10-CM

## 2017-07-04 HISTORY — PX: THORACOTOMY: SHX5074

## 2017-07-04 LAB — COMPREHENSIVE METABOLIC PANEL
ALT: 14 U/L (ref 14–54)
AST: 21 U/L (ref 15–41)
Albumin: 2.8 g/dL — ABNORMAL LOW (ref 3.5–5.0)
Alkaline Phosphatase: 35 U/L — ABNORMAL LOW (ref 38–126)
Anion gap: 7 (ref 5–15)
BUN: 11 mg/dL (ref 6–20)
CO2: 25 mmol/L (ref 22–32)
Calcium: 8 mg/dL — ABNORMAL LOW (ref 8.9–10.3)
Chloride: 101 mmol/L (ref 101–111)
Creatinine, Ser: 0.58 mg/dL (ref 0.44–1.00)
GFR calc Af Amer: >60 mL/min (ref >60)
GFR calc non Af Amer: >60 mL/min (ref >60)
Glucose, Bld: 146 mg/dL — ABNORMAL HIGH (ref 65–99)
Potassium: 3.9 mmol/L (ref 3.5–5.1)
Sodium: 133 mmol/L — ABNORMAL LOW (ref 135–145)
Total Bilirubin: 0.5 mg/dL (ref 0.3–1.2)
Total Protein: 4.7 g/dL — ABNORMAL LOW (ref 6.5–8.1)

## 2017-07-04 LAB — GLUCOSE, CAPILLARY
Glucose-Capillary: 102 mg/dL — ABNORMAL HIGH (ref 65–99)
Glucose-Capillary: 106 mg/dL — ABNORMAL HIGH (ref 65–99)
Glucose-Capillary: 134 mg/dL — ABNORMAL HIGH (ref 65–99)
Glucose-Capillary: 142 mg/dL — ABNORMAL HIGH (ref 65–99)
Glucose-Capillary: 146 mg/dL — ABNORMAL HIGH (ref 65–99)

## 2017-07-04 LAB — CBC
HCT: 28.7 % — ABNORMAL LOW (ref 36.0–46.0)
Hemoglobin: 9.4 g/dL — ABNORMAL LOW (ref 12.0–15.0)
MCH: 28.7 pg (ref 26.0–34.0)
MCHC: 32.8 g/dL (ref 30.0–36.0)
MCV: 87.8 fL (ref 78.0–100.0)
Platelets: 191 10*3/uL (ref 150–400)
RBC: 3.27 MIL/uL — ABNORMAL LOW (ref 3.87–5.11)
RDW: 14.7 % (ref 11.5–15.5)
WBC: 12.9 10*3/uL — ABNORMAL HIGH (ref 4.0–10.5)

## 2017-07-04 LAB — PREPARE RBC (CROSSMATCH)

## 2017-07-04 SURGERY — THORACOTOMY, MAJOR
Anesthesia: General | Site: Chest | Laterality: Left

## 2017-07-04 MED ORDER — SODIUM CHLORIDE 0.9 % IV SOLN
Freq: Once | INTRAVENOUS | Status: AC
  Administered 2017-07-04: 12:00:00 via INTRAVENOUS

## 2017-07-04 MED ORDER — ONDANSETRON HCL 4 MG/2ML IJ SOLN
4.0000 mg | Freq: Four times a day (QID) | INTRAMUSCULAR | Status: DC | PRN
Start: 2017-07-04 — End: 2017-07-06
  Administered 2017-07-05 (×2): 4 mg via INTRAVENOUS
  Filled 2017-07-04 (×3): qty 2

## 2017-07-04 MED ORDER — FENTANYL CITRATE (PF) 100 MCG/2ML IJ SOLN
INTRAMUSCULAR | Status: DC | PRN
  Administered 2017-07-04 (×2): 100 ug via INTRAVENOUS
  Administered 2017-07-04: 50 ug via INTRAVENOUS

## 2017-07-04 MED ORDER — HYDROMORPHONE HCL 1 MG/ML IJ SOLN
INTRAMUSCULAR | Status: AC
  Filled 2017-07-04: qty 1

## 2017-07-04 MED ORDER — BISACODYL 5 MG PO TBEC
10.0000 mg | DELAYED_RELEASE_TABLET | Freq: Every day | ORAL | Status: DC
  Administered 2017-07-05 – 2017-07-14 (×7): 10 mg via ORAL
  Filled 2017-07-04 (×9): qty 2

## 2017-07-04 MED ORDER — SODIUM CHLORIDE 0.9% FLUSH: 9.0000 mL | INTRAVENOUS | Status: DC | PRN

## 2017-07-04 MED ORDER — DIPHENHYDRAMINE HCL 12.5 MG/5ML PO ELIX: 12.5000 mg | ORAL_SOLUTION | Freq: Four times a day (QID) | ORAL | Status: DC | PRN

## 2017-07-04 MED ORDER — LACTATED RINGERS IV SOLN
INTRAVENOUS | Status: DC
  Administered 2017-07-04 (×2): via INTRAVENOUS

## 2017-07-04 MED ORDER — HEMOSTATIC AGENTS (NO CHARGE) OPTIME
TOPICAL | Status: DC | PRN
  Administered 2017-07-04: 1 via TOPICAL

## 2017-07-04 MED ORDER — ROCURONIUM BROMIDE 10 MG/ML (PF) SYRINGE
PREFILLED_SYRINGE | INTRAVENOUS | Status: DC | PRN
  Administered 2017-07-04: 20 mg via INTRAVENOUS
  Administered 2017-07-04: 30 mg via INTRAVENOUS

## 2017-07-04 MED ORDER — OXYCODONE HCL 5 MG PO TABS: 5.0000 mg | ORAL_TABLET | Freq: Once | ORAL | Status: DC | PRN

## 2017-07-04 MED ORDER — SUCCINYLCHOLINE CHLORIDE 20 MG/ML IJ SOLN
INTRAMUSCULAR | Status: DC | PRN
  Administered 2017-07-04: 100 mg via INTRAVENOUS

## 2017-07-04 MED ORDER — 0.9 % SODIUM CHLORIDE (POUR BTL) OPTIME
TOPICAL | Status: DC | PRN
  Administered 2017-07-04: 3000 mL

## 2017-07-04 MED ORDER — PROTAMINE SULFATE 10 MG/ML IV SOLN
INTRAVENOUS | Status: AC
  Filled 2017-07-04: qty 5

## 2017-07-04 MED ORDER — NALOXONE HCL 0.4 MG/ML IJ SOLN: 0.4000 mg | INTRAMUSCULAR | Status: DC | PRN

## 2017-07-04 MED ORDER — SUGAMMADEX SODIUM 200 MG/2ML IV SOLN
INTRAVENOUS | Status: DC | PRN
  Administered 2017-07-04: 138.8 mg via INTRAVENOUS

## 2017-07-04 MED ORDER — SODIUM CHLORIDE 0.9 % IV SOLN: Freq: Once | INTRAVENOUS | Status: DC

## 2017-07-04 MED ORDER — AMIODARONE HCL IN DEXTROSE 360-4.14 MG/200ML-% IV SOLN
60.0000 mg/h | INTRAVENOUS | Status: AC
  Filled 2017-07-04: qty 200

## 2017-07-04 MED ORDER — SENNOSIDES-DOCUSATE SODIUM 8.6-50 MG PO TABS
1.0000 | ORAL_TABLET | Freq: Every day | ORAL | Status: DC
  Administered 2017-07-04 – 2017-07-13 (×9): 1 via ORAL
  Filled 2017-07-04 (×9): qty 1

## 2017-07-04 MED ORDER — PHENYLEPHRINE HCL 10 MG/ML IJ SOLN
INTRAVENOUS | Status: DC | PRN
  Administered 2017-07-04: 35 ug/min via INTRAVENOUS
  Administered 2017-07-04: 50 ug/min via INTRAVENOUS

## 2017-07-04 MED ORDER — HYDROMORPHONE HCL 1 MG/ML IJ SOLN
INTRAMUSCULAR | Status: AC
  Filled 2017-07-04: qty 0.5

## 2017-07-04 MED ORDER — OXYCODONE HCL 5 MG PO TABS
5.0000 mg | ORAL_TABLET | ORAL | Status: DC | PRN
  Administered 2017-07-04: 10 mg via ORAL

## 2017-07-04 MED ORDER — MIDAZOLAM HCL 2 MG/2ML IJ SOLN
INTRAMUSCULAR | Status: AC
  Filled 2017-07-04: qty 2

## 2017-07-04 MED ORDER — ACETAMINOPHEN 160 MG/5ML PO SOLN
1000.0000 mg | Freq: Four times a day (QID) | ORAL | Status: AC
  Administered 2017-07-04 – 2017-07-05 (×4): 1000 mg via ORAL
  Filled 2017-07-04 (×5): qty 40.6

## 2017-07-04 MED ORDER — AMIODARONE HCL IN DEXTROSE 360-4.14 MG/200ML-% IV SOLN: 30.0000 mg/h | INTRAVENOUS | Status: DC

## 2017-07-04 MED ORDER — ONDANSETRON HCL 4 MG/2ML IJ SOLN: 4.0000 mg | Freq: Four times a day (QID) | INTRAMUSCULAR | Status: DC | PRN

## 2017-07-04 MED ORDER — MIDAZOLAM HCL 5 MG/5ML IJ SOLN
INTRAMUSCULAR | Status: DC | PRN
  Administered 2017-07-04 (×2): 1 mg via INTRAVENOUS

## 2017-07-04 MED ORDER — AMIODARONE HCL IN DEXTROSE 360-4.14 MG/200ML-% IV SOLN
30.0000 mg/h | INTRAVENOUS | Status: DC
  Administered 2017-07-04 – 2017-07-06 (×4): 30 mg/h via INTRAVENOUS
  Filled 2017-07-04 (×4): qty 200

## 2017-07-04 MED ORDER — OXYCODONE HCL 5 MG PO TABS
ORAL_TABLET | ORAL | Status: AC
  Filled 2017-07-04: qty 2

## 2017-07-04 MED ORDER — PHENYLEPHRINE HCL 10 MG/ML IJ SOLN
INTRAMUSCULAR | Status: DC | PRN
  Administered 2017-07-04: 160 ug via INTRAVENOUS
  Administered 2017-07-04: 80 ug via INTRAVENOUS
  Administered 2017-07-04 (×2): 160 ug via INTRAVENOUS

## 2017-07-04 MED ORDER — HYDROMORPHONE HCL 1 MG/ML IJ SOLN
INTRAMUSCULAR | Status: DC | PRN
  Administered 2017-07-04: 0.5 mg via INTRAVENOUS

## 2017-07-04 MED ORDER — DIPHENHYDRAMINE HCL 50 MG/ML IJ SOLN: 12.5000 mg | Freq: Four times a day (QID) | INTRAMUSCULAR | Status: DC | PRN

## 2017-07-04 MED ORDER — LEVALBUTEROL HCL 0.63 MG/3ML IN NEBU
0.6300 mg | INHALATION_SOLUTION | Freq: Four times a day (QID) | RESPIRATORY_TRACT | Status: DC
  Administered 2017-07-04 – 2017-07-06 (×8): 0.63 mg via RESPIRATORY_TRACT
  Filled 2017-07-04 (×8): qty 3

## 2017-07-04 MED ORDER — HEPARIN SODIUM (PORCINE) 1000 UNIT/ML IJ SOLN
INTRAMUSCULAR | Status: AC
  Filled 2017-07-04: qty 1

## 2017-07-04 MED ORDER — ALBUMIN HUMAN 5 % IV SOLN
12.5000 g | Freq: Once | INTRAVENOUS | Status: AC
  Administered 2017-07-04: 12.5 g via INTRAVENOUS
  Filled 2017-07-04: qty 250

## 2017-07-04 MED ORDER — FENTANYL CITRATE (PF) 250 MCG/5ML IJ SOLN
INTRAMUSCULAR | Status: AC
  Filled 2017-07-04: qty 5

## 2017-07-04 MED ORDER — HYDROMORPHONE 1 MG/ML IV SOLN
INTRAVENOUS | Status: DC
  Administered 2017-07-04: 3.2 mg via INTRAVENOUS
  Administered 2017-07-04: 14:00:00 via INTRAVENOUS
  Administered 2017-07-05: 8 mg via INTRAVENOUS
  Administered 2017-07-05: 0 mg via INTRAVENOUS
  Filled 2017-07-04: qty 25

## 2017-07-04 MED ORDER — LABETALOL HCL 5 MG/ML IV SOLN
INTRAVENOUS | Status: AC
  Administered 2017-07-04: 10 mg
  Filled 2017-07-04: qty 4

## 2017-07-04 MED ORDER — SODIUM CHLORIDE 0.9 % IV SOLN
INTRAVENOUS | Status: DC
  Administered 2017-07-04: 15:00:00 via INTRAVENOUS

## 2017-07-04 MED ORDER — POTASSIUM CHLORIDE 10 MEQ/50ML IV SOLN
10.0000 meq | Freq: Every day | INTRAVENOUS | Status: DC | PRN
  Administered 2017-07-05 – 2017-07-08 (×5): 10 meq via INTRAVENOUS
  Filled 2017-07-04 (×5): qty 50

## 2017-07-04 MED ORDER — OXYCODONE HCL 5 MG/5ML PO SOLN: 5.0000 mg | Freq: Once | ORAL | Status: DC | PRN

## 2017-07-04 MED ORDER — AMIODARONE HCL IN DEXTROSE 360-4.14 MG/200ML-% IV SOLN
30.0000 mg/h | INTRAVENOUS | Status: AC
  Administered 2017-07-04: 60 mg/h via INTRAVENOUS
  Filled 2017-07-04: qty 200

## 2017-07-04 MED ORDER — PROPOFOL 10 MG/ML IV BOLUS
INTRAVENOUS | Status: DC | PRN
  Administered 2017-07-04: 150 mg via INTRAVENOUS

## 2017-07-04 MED ORDER — METOCLOPRAMIDE HCL 5 MG/ML IJ SOLN
10.0000 mg | Freq: Four times a day (QID) | INTRAMUSCULAR | Status: AC
  Administered 2017-07-04 – 2017-07-05 (×4): 10 mg via INTRAVENOUS
  Filled 2017-07-04 (×4): qty 2

## 2017-07-04 MED ORDER — PROPOFOL 10 MG/ML IV BOLUS
INTRAVENOUS | Status: AC
  Filled 2017-07-04: qty 20

## 2017-07-04 MED ORDER — LEVOFLOXACIN IN D5W 750 MG/150ML IV SOLN
750.0000 mg | Freq: Once | INTRAVENOUS | Status: AC
  Administered 2017-07-04: 750 mg via INTRAVENOUS
  Filled 2017-07-04: qty 150

## 2017-07-04 MED ORDER — ONDANSETRON HCL 4 MG/2ML IJ SOLN
INTRAMUSCULAR | Status: DC | PRN
  Administered 2017-07-04: 4 mg via INTRAVENOUS

## 2017-07-04 MED ORDER — ONDANSETRON HCL 4 MG/2ML IJ SOLN
4.0000 mg | Freq: Four times a day (QID) | INTRAMUSCULAR | Status: DC | PRN
Start: 2017-07-04 — End: 2017-07-04

## 2017-07-04 MED ORDER — HYDROMORPHONE HCL 1 MG/ML IJ SOLN
0.2500 mg | INTRAMUSCULAR | Status: DC | PRN
  Administered 2017-07-04 (×4): 0.5 mg via INTRAVENOUS

## 2017-07-04 MED ORDER — ACETAMINOPHEN 500 MG PO TABS
1000.0000 mg | ORAL_TABLET | Freq: Four times a day (QID) | ORAL | Status: AC
  Administered 2017-07-06 – 2017-07-09 (×12): 1000 mg via ORAL
  Filled 2017-07-04 (×14): qty 2

## 2017-07-04 SURGICAL SUPPLY — 64 items
BENZOIN TINCTURE PRP APPL 2/3 (GAUZE/BANDAGES/DRESSINGS) IMPLANT
CANISTER SUCT 3000ML PPV (MISCELLANEOUS) ×4 IMPLANT
CATH KIT ON Q 5IN SLV (PAIN MANAGEMENT) IMPLANT
CATH THORACIC 28FR (CATHETERS) ×2 IMPLANT
CATH THORACIC 36FR (CATHETERS) IMPLANT
CATH THORACIC 36FR RT ANG (CATHETERS) IMPLANT
CLIP VESOCCLUDE MED 6/CT (CLIP) ×2 IMPLANT
CONN ST 1/4X3/8  BEN (MISCELLANEOUS)
CONN ST 1/4X3/8 BEN (MISCELLANEOUS) IMPLANT
CONN Y 3/8X3/8X3/8  BEN (MISCELLANEOUS)
CONN Y 3/8X3/8X3/8 BEN (MISCELLANEOUS) IMPLANT
CONT SPEC 4OZ CLIKSEAL STRL BL (MISCELLANEOUS) ×4 IMPLANT
COVER SURGICAL LIGHT HANDLE (MISCELLANEOUS) ×2 IMPLANT
DERMABOND ADVANCED (GAUZE/BANDAGES/DRESSINGS) ×1
DERMABOND ADVANCED .7 DNX12 (GAUZE/BANDAGES/DRESSINGS) ×1 IMPLANT
DRAIN CHANNEL 28F RND 3/8 FF (WOUND CARE) IMPLANT
DRAIN CHANNEL 32F RND 10.7 FF (WOUND CARE) IMPLANT
DRAPE LAPAROSCOPIC ABDOMINAL (DRAPES) ×2 IMPLANT
DRAPE WARM FLUID 44X44 (DRAPE) ×2 IMPLANT
DRILL BIT 7/64X5 (BIT) IMPLANT
ELECT REM PT RETURN 9FT ADLT (ELECTROSURGICAL) ×2
ELECTRODE REM PT RTRN 9FT ADLT (ELECTROSURGICAL) ×1 IMPLANT
GAUZE SPONGE 4X4 12PLY STRL LF (GAUZE/BANDAGES/DRESSINGS) ×2 IMPLANT
GLOVE SURG SS PI 6.0 STRL IVOR (GLOVE) ×2 IMPLANT
GLOVE SURG SS PI 6.5 STRL IVOR (GLOVE) ×2 IMPLANT
GLOVE SURG SS PI 7.0 STRL IVOR (GLOVE) ×2 IMPLANT
GOWN STRL REUS W/ TWL LRG LVL3 (GOWN DISPOSABLE) ×2 IMPLANT
GOWN STRL REUS W/ TWL XL LVL3 (GOWN DISPOSABLE) ×1 IMPLANT
GOWN STRL REUS W/TWL LRG LVL3 (GOWN DISPOSABLE) ×2
GOWN STRL REUS W/TWL XL LVL3 (GOWN DISPOSABLE) ×1
HEMOSTAT SURGICEL 2X14 (HEMOSTASIS) ×2 IMPLANT
KIT BASIN OR (CUSTOM PROCEDURE TRAY) ×2 IMPLANT
KIT ROOM TURNOVER OR (KITS) ×2 IMPLANT
NS IRRIG 1000ML POUR BTL (IV SOLUTION) ×6 IMPLANT
PACK CHEST (CUSTOM PROCEDURE TRAY) ×2 IMPLANT
PAD ARMBOARD 7.5X6 YLW CONV (MISCELLANEOUS) ×4 IMPLANT
PASSER SUT SWANSON 36MM LOOP (INSTRUMENTS) ×2 IMPLANT
SEALANT SURG COSEAL 4ML (VASCULAR PRODUCTS) IMPLANT
SEALANT SURG COSEAL 8ML (VASCULAR PRODUCTS) IMPLANT
SOLUTION ANTI FOG 6CC (MISCELLANEOUS) ×2 IMPLANT
SUT PROLENE 3 0 SH DA (SUTURE) IMPLANT
SUT SILK  1 MH (SUTURE) ×2
SUT SILK 1 MH (SUTURE) ×2 IMPLANT
SUT SILK 1 TIES 10X30 (SUTURE) IMPLANT
SUT SILK 2 0 SH (SUTURE) ×2 IMPLANT
SUT SILK 2 0SH CR/8 30 (SUTURE) IMPLANT
SUT SILK 3 0SH CR/8 30 (SUTURE) IMPLANT
SUT VIC AB 1 CTX 36 (SUTURE) ×1
SUT VIC AB 1 CTX36XBRD ANBCTR (SUTURE) ×1 IMPLANT
SUT VIC AB 2-0 CT1 27 (SUTURE)
SUT VIC AB 2-0 CT1 TAPERPNT 27 (SUTURE) IMPLANT
SUT VIC AB 2-0 CTX 36 (SUTURE) ×2 IMPLANT
SUT VIC AB 2-0 UR6 27 (SUTURE) IMPLANT
SUT VIC AB 3-0 MH 27 (SUTURE) IMPLANT
SUT VIC AB 3-0 SH 27 (SUTURE)
SUT VIC AB 3-0 SH 27X BRD (SUTURE) IMPLANT
SUT VIC AB 3-0 X1 27 (SUTURE) ×2 IMPLANT
SUT VICRYL 2 TP 1 (SUTURE) ×2 IMPLANT
SYSTEM SAHARA CHEST DRAIN ATS (WOUND CARE) ×2 IMPLANT
TAPE CLOTH SURG 4X10 WHT LF (GAUZE/BANDAGES/DRESSINGS) ×2 IMPLANT
TIP APPLICATOR SPRAY EXTEND 16 (VASCULAR PRODUCTS) IMPLANT
TRAP SPECIMEN MUCOUS 40CC (MISCELLANEOUS) IMPLANT
TUNNELER SHEATH ON-Q 11GX8 DSP (PAIN MANAGEMENT) IMPLANT
WATER STERILE IRR 1000ML POUR (IV SOLUTION) ×2 IMPLANT

## 2017-07-04 NOTE — Progress Notes (Signed)
Report given to [facility] RN in OR. Pts daughter at bedside.

## 2017-07-04 NOTE — Anesthesia Preprocedure Evaluation (Signed)
Anesthesia Evaluation  Patient identified by MRN, date of birth, ID band Patient awake    Reviewed: Allergy & Precautions, H&P , NPO status , Patient's Chart, lab work & pertinent test results  Airway Mallampati: II   Neck ROM: full    Dental   Pulmonary sleep apnea , COPD, former smoker,  S/p thoracotomy and LLL lobectomy (07/04/17).  Chest tube in place is bleeding.   breath sounds clear to auscultation       Cardiovascular hypertension, + CAD, + Past MI, + CABG, + Peripheral Vascular Disease and +CHF  + dysrhythmias Atrial Fibrillation  Rhythm:regular Rate:Normal     Neuro/Psych  Headaches, Seizures -,  Anxiety    GI/Hepatic   Endo/Other    Renal/GU      Musculoskeletal  (+) Arthritis ,   Abdominal   Peds  Hematology  (+) anemia ,   Anesthesia Other Findings   Reproductive/Obstetrics                             Anesthesia Physical Anesthesia Plan  ASA: III  Anesthesia Plan: General   Post-op Pain Management:    Induction: Intravenous  PONV Risk Score and Plan: 3 and Ondansetron, Dexamethasone, Midazolam and Treatment may vary due to age or medical condition  Airway Management Planned: Double Lumen EBT  Additional Equipment: Arterial line  Intra-op Plan:   Post-operative Plan: Extubation in OR  Informed Consent: I have reviewed the patients History and Physical, chart, labs and discussed the procedure including the risks, benefits and alternatives for the proposed anesthesia with the patient or authorized representative who has indicated his/her understanding and acceptance.     Plan Discussed with: CRNA, Anesthesiologist and Surgeon  Anesthesia Plan Comments:         Anesthesia Quick Evaluation

## 2017-07-04 NOTE — Progress Notes (Signed)
Pt had a line on arrival to pacu

## 2017-07-04 NOTE — Op Note (Signed)
CARDIOTHORACIC SURGERY OPERATIVE NOTE 07/04/2017 [name] [id number]  Surgeon: Gaye Pollack, MD  First Assistant: Nicholes Rough, PA-C    Preoperative Diagnosis: left hemothorax s/p left thoracotomy and left lower lobectomy  Postoperative Diagnosis: same due to bleeding bronchial artery   Procedure:   1. Exploration of left chest, evacuation of hemothorax, ligation of bleeding bronchial artery.   Anesthesia: General Endotracheal   Clinical History/Surgical Indication:  This patient is POD 2 s/p left muscle sparing thoracotomy for left lower lobectomy for a LLL hypermetabolic lung mass. She had an uneventful first postop day but early this am developed hypotension and felt poorly and soon after had a large amount of bloody chest tube drainage that continued. A CXR showed a large left hemothorax and decision was made to take the patient back to the OR for exploration. I discussed the procedure with the patient and her son and daughter and they agreed to proceed.    Procedure:  The patient was taken to the OR and a radial arterial line was place by anesthesia. The consent was signed by me. She was taken back to the OR room and placed under general endotracheal anesthesia using a double lumen tube. She was turned into the right lateral decubitus position and the chest tube removed. The left chest was prepped with betadine soap and solution and draped in the usual sterile manner. A time out was taken and the proper patient, proper operative side and proper procedure was confirmed with nursing and anesthesia. The incision was opened and the muscles retracted. The pericostal sutures were removed and a chest retractor placed. There was a large amount of blood clot in the chest and it was evacuated. There was active bleeding from a bronchial artery on the stapled edge of the lower lobe bronchus. This was stopped with a clip. No other bleeding was found in the chest. The chest was  irrigated with saline.    A 28 F chest tube was  placed in the pleural space posteriorly The ribs were reapproximated with # 2 vicryl pericostal sutures with the sutures placed through holes drilled in the 6th rib and placed around the 5th rib. The muscles were returned to their normal anatomic position. The subcutaneous tissue was closed with 2-0 vicryl suture and the skin with 3-0 vicryl subcuticular suture. The sponge, needle, and instrument counts were correct according to the nurses. Dry sterile dressings were applied and the chest tubes were connected to pleurevac suction. The patient was turned into the supine position, extubated, and transferred to the PACU in satisfactory and stable condition.

## 2017-07-04 NOTE — Progress Notes (Signed)
notifed dr Sherryl Manges of pts hr 148 orders obtained and carried out

## 2017-07-04 NOTE — Brief Op Note (Signed)
07/02/2017 - 07/04/2017  12:19 PM  PATIENT:  [name]  71 y.o. female  PRE-OPERATIVE DIAGNOSIS:  Thoracic Bleeding  POST-OPERATIVE DIAGNOSIS:  Thoracic Bleeding  PROCEDURE:  Procedure(s): THORACOTOMY MAJOR; EXPLORATION LEFT CHEST, LIGATION BLEEDING BRONCHIAL ARTERY, EVACUATION HEMATOMA (Left)  SURGEON:  Surgeon(s) and Role:    * Gaye Pollack, MD - Primary  PHYSICIAN ASSISTANT: Nicholes Rough, PA-C   ANESTHESIA:   general  EBL:  Total I/O In: 1282 [I.V.:900; Blood:322] Out: 530 [Urine:180; Blood:250; Chest Tube:100]  BLOOD ADMINISTERED:2 units  PRBC  DRAINS: one 66F chest tube  Chest Tube(s) in the left pleural space   LOCAL MEDICATIONS USED:  NONE  SPECIMEN:  No Specimen  DISPOSITION OF SPECIMEN:  N/A  COUNTS:  YES  TOURNIQUET:  * No tourniquets in log *  DICTATION: .Note written in EPIC  PLAN OF CARE: Admit to inpatient   PATIENT DISPOSITION:  PACU - hemodynamically stable.   Delay start of Pharmacological VTE agent (>24hrs) due to surgical blood loss or risk of bleeding: yes

## 2017-07-04 NOTE — Transfer of Care (Signed)
Immediate Anesthesia Transfer of Care Note  Patient: [name]  Procedure(s) Performed: Procedure(s): THORACOTOMY MAJOR; EXPLORATION LEFT CHEST, LIGATION BLEEDING BRONCHIAL ARTERY, EVACUATION HEMATOMA (Left)  Patient Location: PACU  Anesthesia Type:General  Level of Consciousness: awake, patient cooperative and responds to stimulation  Airway & Oxygen Therapy: Patient Spontanous Breathing and Patient connected to face mask oxygen  Post-op Assessment: Report given to RN, Post -op Vital signs reviewed and stable and Patient moving all extremities X 4  Post vital signs: Reviewed and stable  Last Vitals:  Vitals:   07/04/17 0900 07/04/17 0945  BP: (!) 133/59   Pulse: 70 73  Resp: (!) 22 20  Temp:    SpO2: 98% 97%    Last Pain:  Vitals:   07/04/17 0945  TempSrc:   PainSc: 0-No pain      Patients Stated Pain Goal: 2 (38/88/75 7972)  Complications: No apparent anesthesia complications

## 2017-07-04 NOTE — Progress Notes (Signed)
Called to Dr. Lawrence Marseilles regarding Sudden chest tube bloody output associated with complaint of Feeling " hot and weird " Vital signs are within expected range, patient is afebrile. 100 ml of bloody drainage rushed out then an additional 400 ml with minutes. Morning specimen were unable to be collected from CVP- peripherally obtained specimen hemolized.

## 2017-07-04 NOTE — Progress Notes (Signed)
23cc wasted of PCA Fentanyl with Jake Bathe, RN via sink and flushed.

## 2017-07-04 NOTE — Plan of Care (Signed)
Problem: Pain Managment: Goal: General experience of comfort will improve Outcome: Progressing Pt has PCA pump Fentanyl

## 2017-07-04 NOTE — Progress Notes (Signed)
2 Days Post-Op Procedure(s) (LRB): THORACOTOMY/LEFT LOWER LOBECTOMY (Left) Subjective: Had episode around 4:30 this am where she felt bad, hot. The around 5:30 had dump of 300+ cc's from bloody drainage from chest tube by report. She had minimal chest tube output until 4 am then 100 cc recorded and 530 cc recorded at 5 am.   Objective: Vital signs in last 24 hours: Temp:  [97.7 F (36.5 C)-98.2 F (36.8 C)] 98.1 F (36.7 C) (08/22 0300) Pulse Rate:  [49-77] 77 (08/22 0800) Cardiac Rhythm: Sinus bradycardia (08/22 0400) Resp:  [10-25] 24 (08/22 0800) BP: (83-151)/(45-69) 144/69 (08/22 0800) SpO2:  [91 %-99 %] 99 % (08/22 0800)  Hemodynamic parameters for last 24 hours:    Intake/Output from previous day: 08/21 0701 - 08/22 0700 In: 236.6 [I.V.:186.6; IV Piggyback:50] Out: 2365 [Urine:1695; Chest Tube:670] Intake/Output this shift: No intake/output data recorded.  General appearance: alert and cooperative Neurologic: intact Heart: regular rate and rhythm, S1, S2 normal, no murmur, click, rub or gallop Lungs: diminished breath sounds left base Wound: dressing dry chest tube output still bloody.  Lab Results:  Recent Labs  07/03/17 0328 07/04/17 0614  WBC 12.8* 12.9*  HGB 10.9* 9.4*  HCT 32.6* 28.7*  PLT 159 191   BMET:  Recent Labs  07/03/17 0328 07/04/17 0614  NA 136 133*  K 4.0 3.9  CL 106 101  CO2 26 25  GLUCOSE 161* 146*  BUN 8 11  CREATININE 0.59 0.58  CALCIUM 7.9* 8.0*    PT/INR:  Recent Labs  07/02/17 0625  LABPROT 15.8*  INR 1.25   ABG    Component Value Date/Time   PHART 7.388 07/03/2017 0340   HCO3 25.3 07/03/2017 0340   TCO2 28 10/06/2007 0251   ACIDBASEDEF 6.0 (H) 09/16/2007 2215   O2SAT 98.9 07/03/2017 0340   CBG (last 3)   Recent Labs  07/03/17 2011 07/04/17 0004 07/04/17 0419  GLUCAP 145* 102* 142*   CXR  At 4:46 am shows a moderate left pleural effusion that is new hemothorax.  Assessment/Plan: S/P Procedure(s)  (LRB): THORACOTOMY/LEFT LOWER LOBECTOMY (Left)  She has bled into left chest this am. Hgb drop from 10.9 to 9.4 this am and she had hypotension to 80's around the same time that she felt bad this am and just before chest tube output. Will get a stat CXR to evaluate further. Keep NPO.   LOS: 2 days    Gaye Pollack 07/04/2017

## 2017-07-04 NOTE — Progress Notes (Signed)
TCTS BRIEF SICU PROGRESS NOTE  Day of Surgery  S/P Procedure(s) (LRB): THORACOTOMY MAJOR; EXPLORATION LEFT CHEST, LIGATION BLEEDING BRONCHIAL ARTERY, EVACUATION HEMATOMA (Left)   Stable after return from PACU Breathing comfortably w/ O2 sats 97% on 2 L/min Chest tube output low UOP adequate  Plan: Continue routine early postop  Rexene Alberts, MD 07/04/2017 7:55 PM

## 2017-07-04 NOTE — Progress Notes (Signed)
Patient ID: [name], female   DOB: October 08, 1946, 71 y.o.   MRN: [id number] Cardiothoracic Surgery:  Follow up CXR shows some retained left hemothorax. There is still some bloody chest tube drainage. I think the safest course is to explore patient in the OR to completely evacuate hemothorax and look for any bleeding sites. I discussed the procedure with the patient and daughter and they are in agreement.

## 2017-07-05 ENCOUNTER — Inpatient Hospital Stay (HOSPITAL_COMMUNITY): Payer: Medicare Other

## 2017-07-05 ENCOUNTER — Encounter (HOSPITAL_COMMUNITY): Payer: Self-pay | Admitting: Surgery

## 2017-07-05 LAB — POCT I-STAT 3, ART BLOOD GAS (G3+)
Bicarbonate: 25.1 mmol/L (ref 20.0–28.0)
O2 Saturation: 99 %
Patient temperature: 98
TCO2: 26 mmol/L (ref 0–100)
pCO2 arterial: 41.3 mmHg (ref 32.0–48.0)
pH, Arterial: 7.391 (ref 7.350–7.450)
pO2, Arterial: 121 mmHg — ABNORMAL HIGH (ref 83.0–108.0)

## 2017-07-05 LAB — CBC
HCT: 30.9 % — ABNORMAL LOW (ref 36.0–46.0)
Hemoglobin: 10.1 g/dL — ABNORMAL LOW (ref 12.0–15.0)
MCH: 28.1 pg (ref 26.0–34.0)
MCHC: 32.7 g/dL (ref 30.0–36.0)
MCV: 85.8 fL (ref 78.0–100.0)
Platelets: 140 10*3/uL — ABNORMAL LOW (ref 150–400)
RBC: 3.6 MIL/uL — ABNORMAL LOW (ref 3.87–5.11)
RDW: 15.5 % (ref 11.5–15.5)
WBC: 9.6 10*3/uL (ref 4.0–10.5)

## 2017-07-05 LAB — POCT I-STAT 7, (LYTES, BLD GAS, ICA,H+H)
Acid-Base Excess: 3 mmol/L — ABNORMAL HIGH (ref 0.0–2.0)
Bicarbonate: 27.3 mmol/L (ref 20.0–28.0)
Calcium, Ion: 1.08 mmol/L — ABNORMAL LOW (ref 1.15–1.40)
HCT: 20 % — ABNORMAL LOW (ref 36.0–46.0)
Hemoglobin: 6.8 g/dL — CL (ref 12.0–15.0)
O2 Saturation: 100 %
Patient temperature: 36.2
Potassium: 3.9 mmol/L (ref 3.5–5.1)
Sodium: 132 mmol/L — ABNORMAL LOW (ref 135–145)
TCO2: 29 mmol/L (ref 0–100)
pCO2 arterial: 39.6 mmHg (ref 32.0–48.0)
pH, Arterial: 7.443 (ref 7.350–7.450)
pO2, Arterial: 265 mmHg — ABNORMAL HIGH (ref 83.0–108.0)

## 2017-07-05 LAB — BPAM RBC
Blood Product Expiration Date: 201809202359
Blood Product Expiration Date: 201809212359
ISSUE DATE / TIME: 201808221106
ISSUE DATE / TIME: 201808221106
Unit Type and Rh: 5100
Unit Type and Rh: 5100

## 2017-07-05 LAB — BASIC METABOLIC PANEL
Anion gap: 5 (ref 5–15)
BUN: 6 mg/dL (ref 6–20)
CO2: 25 mmol/L (ref 22–32)
Calcium: 7.5 mg/dL — ABNORMAL LOW (ref 8.9–10.3)
Chloride: 101 mmol/L (ref 101–111)
Creatinine, Ser: 0.45 mg/dL (ref 0.44–1.00)
GFR calc Af Amer: >60 mL/min (ref >60)
GFR calc non Af Amer: >60 mL/min (ref >60)
Glucose, Bld: 137 mg/dL — ABNORMAL HIGH (ref 65–99)
Potassium: 3.4 mmol/L — ABNORMAL LOW (ref 3.5–5.1)
Sodium: 131 mmol/L — ABNORMAL LOW (ref 135–145)

## 2017-07-05 LAB — POCT I-STAT, CHEM 8
BUN: 4 mg/dL — ABNORMAL LOW (ref 6–20)
Calcium, Ion: 1.15 mmol/L (ref 1.15–1.40)
Chloride: 94 mmol/L — ABNORMAL LOW (ref 101–111)
Creatinine, Ser: 0.4 mg/dL — ABNORMAL LOW (ref 0.44–1.00)
Glucose, Bld: 144 mg/dL — ABNORMAL HIGH (ref 65–99)
HCT: 29 % — ABNORMAL LOW (ref 36.0–46.0)
Hemoglobin: 9.9 g/dL — ABNORMAL LOW (ref 12.0–15.0)
Potassium: 3.8 mmol/L (ref 3.5–5.1)
Sodium: 132 mmol/L — ABNORMAL LOW (ref 135–145)
TCO2: 26 mmol/L (ref 0–100)

## 2017-07-05 LAB — GLUCOSE, CAPILLARY
Glucose-Capillary: 112 mg/dL — ABNORMAL HIGH (ref 65–99)
Glucose-Capillary: 119 mg/dL — ABNORMAL HIGH (ref 65–99)
Glucose-Capillary: 143 mg/dL — ABNORMAL HIGH (ref 65–99)
Glucose-Capillary: 155 mg/dL — ABNORMAL HIGH (ref 65–99)

## 2017-07-05 LAB — TYPE AND SCREEN
ABO/RH(D): O POS
Antibody Screen: NEGATIVE
Unit division: 0
Unit division: 0

## 2017-07-05 MED ORDER — SODIUM CHLORIDE 0.9% FLUSH: 9.0000 mL | INTRAVENOUS | Status: DC | PRN

## 2017-07-05 MED ORDER — AMIODARONE LOAD VIA INFUSION
150.0000 mg | Freq: Once | INTRAVENOUS | Status: AC
  Administered 2017-07-05: 150 mg via INTRAVENOUS
  Filled 2017-07-05: qty 83.34

## 2017-07-05 MED ORDER — HYDROMORPHONE 1 MG/ML IV SOLN
INTRAVENOUS | Status: DC
  Administered 2017-07-05: 0 mg via INTRAVENOUS

## 2017-07-05 MED ORDER — DIPHENHYDRAMINE HCL 12.5 MG/5ML PO ELIX: 12.5000 mg | ORAL_SOLUTION | Freq: Four times a day (QID) | ORAL | Status: DC | PRN

## 2017-07-05 MED ORDER — KETOROLAC TROMETHAMINE 15 MG/ML IJ SOLN
15.0000 mg | Freq: Four times a day (QID) | INTRAMUSCULAR | Status: AC | PRN
  Administered 2017-07-05: 15 mg via INTRAVENOUS
  Filled 2017-07-05 (×2): qty 1

## 2017-07-05 MED ORDER — INSULIN ASPART 100 UNIT/ML ~~LOC~~ SOLN
0.0000 [IU] | SUBCUTANEOUS | Status: DC
  Administered 2017-07-05: 3 [IU] via SUBCUTANEOUS

## 2017-07-05 MED ORDER — NALOXONE HCL 0.4 MG/ML IJ SOLN: 0.4000 mg | INTRAMUSCULAR | Status: DC | PRN

## 2017-07-05 MED ORDER — DIPHENHYDRAMINE HCL 50 MG/ML IJ SOLN: 12.5000 mg | Freq: Four times a day (QID) | INTRAMUSCULAR | Status: DC | PRN

## 2017-07-05 MED ORDER — METOCLOPRAMIDE HCL 5 MG/ML IJ SOLN
10.0000 mg | Freq: Four times a day (QID) | INTRAMUSCULAR | Status: AC
  Administered 2017-07-05 – 2017-07-07 (×6): 10 mg via INTRAVENOUS
  Filled 2017-07-05 (×6): qty 2

## 2017-07-05 MED ORDER — FENTANYL 40 MCG/ML IV SOLN
INTRAVENOUS | Status: DC
  Administered 2017-07-05: 21:00:00 via INTRAVENOUS
  Administered 2017-07-06 (×2): 10 ug via INTRAVENOUS
  Administered 2017-07-06: 60 ug via INTRAVENOUS
  Administered 2017-07-06: 30 ug via INTRAVENOUS
  Administered 2017-07-06: 80 ug via INTRAVENOUS
  Administered 2017-07-06: 50 ug via INTRAVENOUS
  Administered 2017-07-07: 18.75 mL via INTRAVENOUS
  Administered 2017-07-07: 70 ug via INTRAVENOUS
  Administered 2017-07-07: 30 ug via INTRAVENOUS
  Administered 2017-07-07: 90 ug via INTRAVENOUS
  Administered 2017-07-07: 70 ug via INTRAVENOUS
  Administered 2017-07-08 (×2): 40 ug via INTRAVENOUS
  Administered 2017-07-08: 20 ug via INTRAVENOUS
  Filled 2017-07-05: qty 25

## 2017-07-05 MED ORDER — ONDANSETRON HCL 4 MG/2ML IJ SOLN
4.0000 mg | Freq: Four times a day (QID) | INTRAMUSCULAR | Status: DC | PRN
  Administered 2017-07-05 – 2017-07-06 (×2): 4 mg via INTRAVENOUS
  Filled 2017-07-05 (×2): qty 2

## 2017-07-05 MED ORDER — OXYCODONE HCL 5 MG PO TABS
5.0000 mg | ORAL_TABLET | ORAL | Status: DC | PRN
  Administered 2017-07-05 – 2017-07-06 (×4): 5 mg via ORAL
  Filled 2017-07-05 (×4): qty 1

## 2017-07-05 MED ORDER — ONDANSETRON HCL 4 MG/2ML IJ SOLN: 4.0000 mg | Freq: Four times a day (QID) | INTRAMUSCULAR | Status: DC | PRN

## 2017-07-05 MED ORDER — METOPROLOL SUCCINATE ER 50 MG PO TB24: 50.0000 mg | ORAL_TABLET | Freq: Every day | ORAL | Status: DC

## 2017-07-05 NOTE — Anesthesia Postprocedure Evaluation (Signed)
Anesthesia Post Note  Patient: [name]  Procedure(s) Performed: Procedure(s) (LRB): THORACOTOMY MAJOR; EXPLORATION LEFT CHEST, LIGATION BLEEDING BRONCHIAL ARTERY, EVACUATION HEMATOMA (Left)     Patient location during evaluation: PACU Anesthesia Type: General Level of consciousness: awake and alert and patient cooperative Pain management: pain level controlled Vital Signs Assessment: post-procedure vital signs reviewed and stable Respiratory status: spontaneous breathing and respiratory function stable Cardiovascular status: stable Anesthetic complications: no    Last Vitals:  Vitals:   07/05/17 1152 07/05/17 1153  BP:    Pulse:    Resp: 13 13  Temp:    SpO2: 100% 100%    Last Pain:  Vitals:   07/05/17 1153  TempSrc:   PainSc: 3                  Manuel Lawhead S

## 2017-07-05 NOTE — Progress Notes (Signed)
1 Day Post-Op Procedure(s) (LRB): THORACOTOMY MAJOR; EXPLORATION LEFT CHEST, LIGATION BLEEDING BRONCHIAL ARTERY, EVACUATION HEMATOMA (Left) Subjective: Slept well. Pain under adequate control  Objective: Vital signs in last 24 hours: Temp:  [97.7 F (36.5 C)-98.2 F (36.8 C)] 98.2 F (36.8 C) (08/23 0729) Pulse Rate:  [52-137] 104 (08/23 0800) Cardiac Rhythm: Atrial fibrillation (08/23 0640) Resp:  [7-31] 23 (08/23 0800) BP: (101-146)/(49-107) 110/53 (08/23 0600) SpO2:  [86 %-100 %] 100 % (08/23 0800) Arterial Line BP: (101-173)/(40-83) 130/62 (08/23 0800) Weight:  [69.4 kg (153 lb)] 69.4 kg (153 lb) (08/22 1046)  Hemodynamic parameters for last 24 hours:    Intake/Output from previous day: 08/22 0701 - 08/23 0700 In: 2633.9 [P.O.:120; I.V.:1876.9; Blood:637] Out: 1775 [Urine:1010; Blood:250; Chest Tube:515] Intake/Output this shift: Total I/O In: 126.7 [P.O.:60; I.V.:16.7; IV Piggyback:50] Out: 150 [Urine:150]  General appearance: alert and cooperative Neurologic: intact Heart: irregularly irregular rhythm Lungs: clear to auscultation bilaterally Wound: dressing dry chest tube output serous, no air leak  Lab Results:  Recent Labs  07/04/17 0614 07/05/17 0422  WBC 12.9* 9.6  HGB 9.4* 10.1*  HCT 28.7* 30.9*  PLT 191 140*   BMET:  Recent Labs  07/04/17 0614 07/05/17 0422  NA 133* 131*  K 3.9 3.4*  CL 101 101  CO2 25 25  GLUCOSE 146* 137*  BUN 11 6  CREATININE 0.58 0.45  CALCIUM 8.0* 7.5*    PT/INR: No results for input(s): LABPROT, INR in the last 72 hours. ABG    Component Value Date/Time   PHART 7.391 07/05/2017 0429   HCO3 25.1 07/05/2017 0429   TCO2 26 07/05/2017 0429   ACIDBASEDEF 6.0 (H) 09/16/2007 2215   O2SAT 99.0 07/05/2017 0429   CBG (last 3)   Recent Labs  07/04/17 0842 07/04/17 1606 07/04/17 2026  GLUCAP 146* 106* 134*   CXR: mild left basilar atelectasis.  Assessment/Plan: S/P Procedure(s) (LRB): THORACOTOMY MAJOR;  EXPLORATION LEFT CHEST, LIGATION BLEEDING BRONCHIAL ARTERY, EVACUATION HEMATOMA (Left)  She is doing well following left lower lobectomy Monday and exploration for bleeding yesterday. Will put chest tube to water seal and plan to remove tomorrow if no problems. Continue IS, begin ambulation.  Hx of PAF and went into atrial fib yesterday when she was bleeding. She converted to sinus on amio but back in atrial fib this am. Will give a bolus of amio and continue drip. Resume Toprol XL.  DC arterial line  Advance diet as tolerated.  Continue CBG's and SSI.   Pathology shows 1.3 cm squamous cell carcinoma with all lymph nodes negative. T1b, N0, M0. No further treatment will be needed. Discussed with patient and daughter.   LOS: 3 days    Gaye Pollack 07/05/2017

## 2017-07-05 NOTE — Progress Notes (Signed)
50mls of dilaudid pca wasted in the sink ,witnessed by Samantha Crimes RN

## 2017-07-05 NOTE — Progress Notes (Signed)
CT Surgery  C/o nausea with incisional pain- resume reglan and chg to fentanyl PCA 200 cc chest tube output first shift

## 2017-07-06 ENCOUNTER — Inpatient Hospital Stay (HOSPITAL_COMMUNITY): Payer: Medicare Other

## 2017-07-06 LAB — CBC
HCT: 29.8 % — ABNORMAL LOW (ref 36.0–46.0)
Hemoglobin: 9.8 g/dL — ABNORMAL LOW (ref 12.0–15.0)
MCH: 28.4 pg (ref 26.0–34.0)
MCHC: 32.9 g/dL (ref 30.0–36.0)
MCV: 86.4 fL (ref 78.0–100.0)
Platelets: 163 10*3/uL (ref 150–400)
RBC: 3.45 MIL/uL — ABNORMAL LOW (ref 3.87–5.11)
RDW: 15.4 % (ref 11.5–15.5)
WBC: 10.4 10*3/uL (ref 4.0–10.5)

## 2017-07-06 LAB — COMPREHENSIVE METABOLIC PANEL
ALT: 16 U/L (ref 14–54)
AST: 23 U/L (ref 15–41)
Albumin: 2.6 g/dL — ABNORMAL LOW (ref 3.5–5.0)
Alkaline Phosphatase: 33 U/L — ABNORMAL LOW (ref 38–126)
Anion gap: 8 (ref 5–15)
BUN: 7 mg/dL (ref 6–20)
CO2: 26 mmol/L (ref 22–32)
Calcium: 8.2 mg/dL — ABNORMAL LOW (ref 8.9–10.3)
Chloride: 98 mmol/L — ABNORMAL LOW (ref 101–111)
Creatinine, Ser: 0.5 mg/dL (ref 0.44–1.00)
GFR calc Af Amer: >60 mL/min (ref >60)
GFR calc non Af Amer: >60 mL/min (ref >60)
Glucose, Bld: 113 mg/dL — ABNORMAL HIGH (ref 65–99)
Potassium: 3.9 mmol/L (ref 3.5–5.1)
Sodium: 132 mmol/L — ABNORMAL LOW (ref 135–145)
Total Bilirubin: 0.6 mg/dL (ref 0.3–1.2)
Total Protein: 4.6 g/dL — ABNORMAL LOW (ref 6.5–8.1)

## 2017-07-06 LAB — GLUCOSE, CAPILLARY
Glucose-Capillary: 63 mg/dL — ABNORMAL LOW (ref 65–99)
Glucose-Capillary: 109 mg/dL — ABNORMAL HIGH (ref 65–99)
Glucose-Capillary: 96 mg/dL (ref 65–99)
Glucose-Capillary: 112 mg/dL — ABNORMAL HIGH (ref 65–99)
Glucose-Capillary: 107 mg/dL — ABNORMAL HIGH (ref 65–99)

## 2017-07-06 MED ORDER — AMIODARONE HCL 200 MG PO TABS
200.0000 mg | ORAL_TABLET | Freq: Two times a day (BID) | ORAL | Status: DC
  Administered 2017-07-06 – 2017-07-09 (×7): 200 mg via ORAL
  Filled 2017-07-06 (×7): qty 1

## 2017-07-06 MED ORDER — METOPROLOL SUCCINATE ER 25 MG PO TB24
25.0000 mg | ORAL_TABLET | Freq: Every day | ORAL | Status: DC
  Administered 2017-07-07 – 2017-07-11 (×5): 25 mg via ORAL
  Filled 2017-07-06 (×6): qty 1

## 2017-07-06 MED ORDER — OXYCODONE HCL 5 MG PO TABS
5.0000 mg | ORAL_TABLET | ORAL | Status: DC | PRN
  Administered 2017-07-06: 10 mg via ORAL
  Administered 2017-07-06: 5 mg via ORAL
  Administered 2017-07-06 – 2017-07-14 (×42): 10 mg via ORAL
  Filled 2017-07-06 (×27): qty 2
  Filled 2017-07-06: qty 1
  Filled 2017-07-06 (×16): qty 2

## 2017-07-06 MED ORDER — LACTULOSE 10 GM/15ML PO SOLN
20.0000 g | Freq: Every day | ORAL | Status: DC | PRN
  Administered 2017-07-12 – 2017-07-13 (×2): 20 g via ORAL
  Filled 2017-07-06 (×2): qty 30

## 2017-07-06 MED ORDER — LEVALBUTEROL HCL 0.63 MG/3ML IN NEBU
0.6300 mg | INHALATION_SOLUTION | Freq: Four times a day (QID) | RESPIRATORY_TRACT | Status: DC | PRN
  Administered 2017-07-07: 0.63 mg via RESPIRATORY_TRACT
  Filled 2017-07-06: qty 3

## 2017-07-06 MED ORDER — LEVALBUTEROL HCL 0.63 MG/3ML IN NEBU
0.6300 mg | INHALATION_SOLUTION | Freq: Two times a day (BID) | RESPIRATORY_TRACT | Status: DC
  Administered 2017-07-07 (×2): 0.63 mg via RESPIRATORY_TRACT
  Filled 2017-07-06 (×3): qty 3

## 2017-07-06 NOTE — Progress Notes (Addendum)
TCTS DAILY ICU PROGRESS NOTE                   [address]            [place] [address]          [phone]   2 Days Post-Op Procedure(s) (LRB): THORACOTOMY MAJOR; EXPLORATION LEFT CHEST, LIGATION BLEEDING BRONCHIAL ARTERY, EVACUATION HEMATOMA (Left)  Total Length of Stay:  LOS: 4 days   Subjective: Feels fair, not SOB, some nausea  Objective: Vital signs in last 24 hours: Temp:  [97.7 F (36.5 C)-98.7 F (37.1 C)] 98.7 F (37.1 C) (08/24 0800) Pulse Rate:  [51-77] 56 (08/24 0800) Cardiac Rhythm: Sinus bradycardia (08/24 0400) Resp:  [7-30] 17 (08/24 0800) BP: (90-144)/(45-65) 120/45 (08/24 0800) SpO2:  [99 %-100 %] 99 % (08/24 0800) Arterial Line BP: (148)/(57) 148/57 (08/23 1000) Weight:  [164 lb 6.4 oz (74.6 kg)] 164 lb 6.4 oz (74.6 kg) (08/24 0700)  Filed Weights   07/02/17 1416 07/04/17 1046 07/06/17 0700  Weight: 153 lb (69.4 kg) 153 lb (69.4 kg) 164 lb 6.4 oz (74.6 kg)    Weight change: 11 lb 6.4 oz (5.171 kg)   Hemodynamic parameters for last 24 hours:    Intake/Output from previous day: 08/23 0701 - 08/24 0700 In: 1354.1 [P.O.:420; I.V.:784.1; IV Piggyback:150] Out: 1170 [Urine:860; Chest Tube:310]  Intake/Output this shift: Total I/O In: 96.7 [P.O.:60; I.V.:36.7] Out: 100 [Chest Tube:100]  Current Meds: Scheduled Meds: . acetaminophen  1,000 mg Oral Q6H   Or  . acetaminophen (TYLENOL) oral liquid 160 mg/5 mL  1,000 mg Oral Q6H  . amiodarone  200 mg Oral BID  . bisacodyl  10 mg Oral Daily  . calcium-vitamin D  1 tablet Oral Daily  . Chlorhexidine Gluconate Cloth  6 each Topical Daily  . cholecalciferol  5,000 Units Oral Daily  . ezetimibe  10 mg Oral Daily  . fentaNYL   Intravenous Q4H  . isosorbide mononitrate  30 mg Oral Daily  . levalbuterol  0.63 mg Nebulization Q6H  . metoCLOPramide (REGLAN) injection  10 mg Intravenous Q6H  . metoprolol succinate  25 mg Oral Daily  . omega-3 acid ethyl esters  1 g Oral Daily  .  pyridOXINE  100 mg Oral Daily  . senna-docusate  1 tablet Oral QHS  . simvastatin  20 mg Oral q1800  . sodium chloride flush  10 mL Intracatheter Q12H  . sodium chloride flush  10-40 mL Intracatheter Q12H  . umeclidinium bromide  1 puff Inhalation Daily   Continuous Infusions: . sodium chloride 20 mL/hr at 07/05/17 0700  . potassium chloride Stopped (07/05/17 1156)   PRN Meds:.diphenhydrAMINE **OR** diphenhydrAMINE, fluticasone, ketorolac, lactulose, naloxone **AND** sodium chloride flush, ondansetron (ZOFRAN) IV, oxyCODONE, potassium chloride  General appearance: alert, cooperative and no distress Heart: regular rate and rhythm Lungs: coarse on left, dim in bases Abdomen: soft, + BS, non tender Extremities: no edema or calf tenderness Wound: incis healing well without signs of infection  Lab Results: CBC: Recent Labs  07/05/17 0422 07/05/17 1818 07/06/17 0413  WBC 9.6  --  10.4  HGB 10.1* 9.9* 9.8*  HCT 30.9* 29.0* 29.8*  PLT 140*  --  163   BMET:  Recent Labs  07/05/17 0422 07/05/17 1818 07/06/17 0413  NA 131* 132* 132*  K 3.4* 3.8 3.9  CL 101 94* 98*  CO2 25  --  26  GLUCOSE 137* 144* 113*  BUN 6 4* 7  CREATININE 0.45 0.40* 0.50  CALCIUM 7.5*  --  8.2*    CMET: Lab Results  Component Value Date   WBC 10.4 07/06/2017   HGB 9.8 (L) 07/06/2017   HCT 29.8 (L) 07/06/2017   PLT 163 07/06/2017   GLUCOSE 113 (H) 07/06/2017   CHOL 185 10/26/2016   TRIG 110 10/26/2016   HDL 70 10/26/2016   LDLCALC 93 10/26/2016   ALT 16 07/06/2017   AST 23 07/06/2017   NA 132 (L) 07/06/2017   K 3.9 07/06/2017   CL 98 (L) 07/06/2017   CREATININE 0.50 07/06/2017   BUN 7 07/06/2017   CO2 26 07/06/2017   TSH 1.866 01/02/2017   INR 1.25 07/02/2017   HGBA1C 5.7 (H) 02/21/2013      PT/INR: No results for input(s): LABPROT, INR in the last 72 hours. Radiology: Dg Chest Port 1 View  Result Date: 07/06/2017 CLINICAL DATA:  Status post lobectomy with chest tube in place  EXAM: PORTABLE CHEST 1 VIEW COMPARISON:  July 05, 2017 FINDINGS: Chest tube tip is in the medial left apex. Left jugular catheter tip is in the left innominate vein, stable. There is a minimal left apical pneumothorax that tension component. There is a small right pleural effusion. There is patchy atelectasis on the left and milder atelectasis in the right base. There is slightly less atelectasis in the right base compared to 1 day prior. Lungs elsewhere are clear. Heart is upper normal in size with pulmonary vascularity within normal limits. There is aortic atherosclerosis. No adenopathy. There is degenerative change in each shoulder. IMPRESSION: Tube and catheter positions unchanged. Minimal left apical pneumothorax without tension component. Small right pleural effusion. Bibasilar atelectasis, slightly more on the left than on the right. Less atelectasis right base compared to 1 day prior. Stable cardiac silhouette. There is aortic atherosclerosis. Aortic Atherosclerosis (ICD10-I70.0). Electronically Signed   By: Lowella Grip III M.D.   On: 07/06/2017 08:19     Assessment/Plan: S/P Procedure(s) (LRB): THORACOTOMY MAJOR; EXPLORATION LEFT CHEST, LIGATION BLEEDING BRONCHIAL ARTERY, EVACUATION HEMATOMA (Left)  1 some afib, now sinus on amio gtt, will have eto keep close monitoring for any signs of pulmonary toxicity, cont routine nebs 2 CXR reviewed. Push pilm toilet as able for atx 3 Mod pleural drainage ans tiny pntx/space- keep tube for now to H2O seal 4 H/H is stable 5 renal fxn is normal 6 cont PCA/Toradol for now 7 sugars well controlled, diet control , borderline DM 8 d/c foley when a little more mobile GOLD,WAYNE E 07/06/2017 9:28 AM   No coumadin or lovenox due to postop bleeding Daily CXR while tube in place- leave in with drainage> 300 cc Leave foley to monitor renal fx Keep in ICU today patient examined and medical record reviewed,agree with above note by Abner Greenspan Trigt  III 07/06/2017

## 2017-07-06 NOTE — Progress Notes (Signed)
TCTS BRIEF SICU PROGRESS NOTE  2 Days Post-Op  S/P Procedure(s) (LRB): THORACOTOMY MAJOR; EXPLORATION LEFT CHEST, LIGATION BLEEDING BRONCHIAL ARTERY, EVACUATION HEMATOMA (Left)   Stable day  Plan: Continue current plan  Rexene Alberts, MD 07/06/2017 6:21 PM

## 2017-07-06 NOTE — Progress Notes (Deleted)
When RT enter room pt has already placed him self on CPAP.  Pt is tolerating well at this time.

## 2017-07-07 ENCOUNTER — Inpatient Hospital Stay (HOSPITAL_COMMUNITY): Payer: Medicare Other

## 2017-07-07 LAB — GLUCOSE, CAPILLARY
Glucose-Capillary: 103 mg/dL — ABNORMAL HIGH (ref 65–99)
Glucose-Capillary: 114 mg/dL — ABNORMAL HIGH (ref 65–99)
Glucose-Capillary: 88 mg/dL (ref 65–99)
Glucose-Capillary: 96 mg/dL (ref 65–99)
Glucose-Capillary: 97 mg/dL (ref 65–99)

## 2017-07-07 LAB — CBC
HCT: 27.6 % — ABNORMAL LOW (ref 36.0–46.0)
Hemoglobin: 9.2 g/dL — ABNORMAL LOW (ref 12.0–15.0)
MCH: 28.8 pg (ref 26.0–34.0)
MCHC: 33.3 g/dL (ref 30.0–36.0)
MCV: 86.5 fL (ref 78.0–100.0)
Platelets: 164 10*3/uL (ref 150–400)
RBC: 3.19 MIL/uL — ABNORMAL LOW (ref 3.87–5.11)
RDW: 15.2 % (ref 11.5–15.5)
WBC: 8 10*3/uL (ref 4.0–10.5)

## 2017-07-07 MED ORDER — AMIODARONE HCL IN DEXTROSE 360-4.14 MG/200ML-% IV SOLN
30.0000 mg/h | INTRAVENOUS | Status: DC
  Administered 2017-07-07 – 2017-07-08 (×2): 30 mg/h via INTRAVENOUS
  Filled 2017-07-07 (×2): qty 200

## 2017-07-07 MED ORDER — AMIODARONE HCL IN DEXTROSE 360-4.14 MG/200ML-% IV SOLN
60.0000 mg/h | INTRAVENOUS | Status: AC
  Administered 2017-07-07: 60 mg/h via INTRAVENOUS
  Filled 2017-07-07: qty 200

## 2017-07-07 MED ORDER — AMIODARONE LOAD VIA INFUSION
150.0000 mg | Freq: Once | INTRAVENOUS | Status: AC
  Administered 2017-07-07: 150 mg via INTRAVENOUS
  Filled 2017-07-07: qty 83.34

## 2017-07-07 NOTE — Progress Notes (Signed)
TCTS BRIEF SICU PROGRESS NOTE  3 Days Post-Op  S/P Procedure(s) (LRB): THORACOTOMY MAJOR; EXPLORATION LEFT CHEST, LIGATION BLEEDING BRONCHIAL ARTERY, EVACUATION HEMATOMA (Left)   Episode rapid Afib earlier today, back in NSR on IV amiodarone Otherwise doing well  Plan: Continue current plan  Rexene Alberts, MD 07/07/2017 7:08 PM

## 2017-07-07 NOTE — Progress Notes (Addendum)
      [address]       [place] [address]             [phone]        CARDIOTHORACIC SURGERY PROGRESS NOTE   R3 Days Post-Op Procedure(s) (LRB): THORACOTOMY MAJOR; EXPLORATION LEFT CHEST, LIGATION BLEEDING BRONCHIAL ARTERY, EVACUATION HEMATOMA (Left)  Subjective: Feels okay.  Had some Afib overnight which makes her feel tired.  Doing better this morning.  Pain improved  Objective: Vital signs: BP Readings from Last 1 Encounters:  07/07/17 (!) 112/47   Pulse Readings from Last 1 Encounters:  07/07/17 (!) 55   Resp Readings from Last 1 Encounters:  07/07/17 12   Temp Readings from Last 1 Encounters:  07/07/17 98.1 F (36.7 C) (Oral)     Physical Exam:  Rhythm:   sinus  Breath sounds: clear  Heart sounds:  RRR  Incisions:  Dressing dry, intact  Abdomen:  Soft, non-distended, non-tender  Extremities:  Warm, well-perfused  Chest tubes:  low volume thin serosanguinous output, no air leak    Intake/Output from previous day: 08/24 0701 - 08/25 0700 In: 1392 [P.O.:540; I.V.:852] Out: 1941 [Urine:1450; Stool:201; Chest Tube:290] Intake/Output this shift: Total I/O In: 80 [I.V.:80] Out: 130 [Urine:100; Chest Tube:30]  Lab Results:  CBC: Recent Labs  07/06/17 0413 07/07/17 0502  WBC 10.4 8.0  HGB 9.8* 9.2*  HCT 29.8* 27.6*  PLT 163 164    BMET:  Recent Labs  07/05/17 0422 07/05/17 1818 07/06/17 0413  NA 131* 132* 132*  K 3.4* 3.8 3.9  CL 101 94* 98*  CO2 25  --  26  GLUCOSE 137* 144* 113*  BUN 6 4* 7  CREATININE 0.45 0.40* 0.50  CALCIUM 7.5*  --  8.2*     PT/INR:  No results for input(s): LABPROT, INR in the last 72 hours.  CBG (last 3)   Recent Labs  07/06/17 1746 07/07/17 0018 07/07/17 0431  GLUCAP 107* 97 96    ABG    Component Value Date/Time   PHART 7.391 07/05/2017 0429   PCO2ART 41.3 07/05/2017 0429   PO2ART 121.0 (H) 07/05/2017 0429   HCO3 25.1 07/05/2017 0429   TCO2 26 07/05/2017 1818   ACIDBASEDEF  6.0 (H) 09/16/2007 2215   O2SAT 99.0 07/05/2017 0429    CXR: Clear.  No PTX  Assessment/Plan: S/P Procedure(s) (LRB): THORACOTOMY MAJOR; EXPLORATION LEFT CHEST, LIGATION BLEEDING BRONCHIAL ARTERY, EVACUATION HEMATOMA (Left)  Making good progress D/C chest tube D/C foley Transfer step down Anticipate possible d/c home 2-3 days Continue amiodarone Resume Eliquis at discharge  Rexene Alberts, MD 07/07/2017 9:17 AM

## 2017-07-07 NOTE — Progress Notes (Addendum)
PT sinus brady low to mid 50's. MD verbal order to slow the amio gtt down to 30. Will change rate and monitor closely.  Lucius Conn, RN

## 2017-07-07 NOTE — Progress Notes (Signed)
Pt now is rapid afib rates into the 140's  8 minutes after chest tube removal. Roxy Manns, MD made aware. MD verbal order give amio bolus followed by infusion. MD verbal order to keep giving her po amio also at this time. Will administer and monitor closely.   Lucius Conn, RN

## 2017-07-07 NOTE — Progress Notes (Signed)
Patient now in NSR. Will continue to monitor.  Lucius Conn, RN

## 2017-07-07 NOTE — Progress Notes (Signed)
Pt ambulated 375 feet using the avasys walker. Pt tolerated very well. Will continue to monitor.  Lucius Conn, RN

## 2017-07-08 ENCOUNTER — Inpatient Hospital Stay (HOSPITAL_COMMUNITY): Payer: Medicare Other

## 2017-07-08 LAB — BASIC METABOLIC PANEL
Anion gap: 6 (ref 5–15)
BUN: 7 mg/dL (ref 6–20)
CO2: 30 mmol/L (ref 22–32)
Calcium: 8.2 mg/dL — ABNORMAL LOW (ref 8.9–10.3)
Chloride: 99 mmol/L — ABNORMAL LOW (ref 101–111)
Creatinine, Ser: 0.51 mg/dL (ref 0.44–1.00)
GFR calc Af Amer: >60 mL/min (ref >60)
GFR calc non Af Amer: >60 mL/min (ref >60)
Glucose, Bld: 108 mg/dL — ABNORMAL HIGH (ref 65–99)
Potassium: 3.7 mmol/L (ref 3.5–5.1)
Sodium: 135 mmol/L (ref 135–145)

## 2017-07-08 LAB — CBC
HCT: 28.8 % — ABNORMAL LOW (ref 36.0–46.0)
Hemoglobin: 9.4 g/dL — ABNORMAL LOW (ref 12.0–15.0)
MCH: 28.8 pg (ref 26.0–34.0)
MCHC: 32.6 g/dL (ref 30.0–36.0)
MCV: 88.3 fL (ref 78.0–100.0)
Platelets: 199 10*3/uL (ref 150–400)
RBC: 3.26 MIL/uL — ABNORMAL LOW (ref 3.87–5.11)
RDW: 15.5 % (ref 11.5–15.5)
WBC: 8.7 10*3/uL (ref 4.0–10.5)

## 2017-07-08 MED ORDER — POTASSIUM CHLORIDE CRYS ER 20 MEQ PO TBCR
20.0000 meq | EXTENDED_RELEASE_TABLET | ORAL | Status: DC | PRN
  Filled 2017-07-08: qty 1

## 2017-07-08 MED ORDER — ENOXAPARIN SODIUM 30 MG/0.3ML ~~LOC~~ SOLN
30.0000 mg | SUBCUTANEOUS | Status: DC
  Administered 2017-07-08 – 2017-07-14 (×7): 30 mg via SUBCUTANEOUS
  Filled 2017-07-08 (×7): qty 0.3

## 2017-07-08 NOTE — Progress Notes (Signed)
Wasted 53ml Fentanyl PCA in sink with Traci, RN.  Lucius Conn, RN

## 2017-07-08 NOTE — Progress Notes (Signed)
      [address]       [place] [address]             [phone]        CARDIOTHORACIC SURGERY PROGRESS NOTE   R4 Days Post-Op Procedure(s) (LRB): THORACOTOMY MAJOR; EXPLORATION LEFT CHEST, LIGATION BLEEDING BRONCHIAL ARTERY, EVACUATION HEMATOMA (Left)  Subjective: Feels some better  Objective: Vital signs: BP Readings from Last 1 Encounters:  07/08/17 (!) 138/47   Pulse Readings from Last 1 Encounters:  07/08/17 (!) 55   Resp Readings from Last 1 Encounters:  07/08/17 17   Temp Readings from Last 1 Encounters:  07/08/17 97.8 F (36.6 C) (Oral)    Hemodynamics:    Physical Exam:  Rhythm:   sinus  Breath sounds: Diminished at bases  Heart sounds:  RRR  Incisions:  Dressing dry, intact  Abdomen:  Soft, non-distended, non-tender  Extremities:  Warm, well-perfused   Intake/Output from previous day: 08/25 0701 - 08/26 0700 In: 1389.3 [P.O.:600; I.V.:789.3] Out: 1330 [Urine:1200; Chest Tube:130] Intake/Output this shift: No intake/output data recorded.  Lab Results:  CBC: Recent Labs  07/07/17 0502 07/08/17 0427  WBC 8.0 8.7  HGB 9.2* 9.4*  HCT 27.6* 28.8*  PLT 164 199    BMET:  Recent Labs  07/06/17 0413 07/08/17 0427  NA 132* 135  K 3.9 3.7  CL 98* 99*  CO2 26 30  GLUCOSE 113* 108*  BUN 7 7  CREATININE 0.50 0.51  CALCIUM 8.2* 8.2*     PT/INR:  No results for input(s): LABPROT, INR in the last 72 hours.  CBG (last 3)   Recent Labs  07/07/17 0829 07/07/17 1233 07/07/17 1627  GLUCAP 88 103* 114*    ABG    Component Value Date/Time   PHART 7.391 07/05/2017 0429   PCO2ART 41.3 07/05/2017 0429   PO2ART 121.0 (H) 07/05/2017 0429   HCO3 25.1 07/05/2017 0429   TCO2 26 07/05/2017 1818   ACIDBASEDEF 6.0 (H) 09/16/2007 2215   O2SAT 99.0 07/05/2017 0429    CXR: CHEST  2 VIEW  COMPARISON:  07/07/2017  FINDINGS: Left chest tube has been removed in the interval. No evidence for residual left-sided  pneumothorax. Left IJ central line tip overlies the innominate vein confluence. Lung volumes are low. Interval increase and bibasilar atelectasis with small bilateral pleural effusions evident. Telemetry leads overlie the chest.  IMPRESSION: 1. Interval removal of left chest tube without evidence for residual pneumothorax. 2. Worsening bibasilar atelectasis with small bilateral pleural effusions.   Electronically Signed   By: Misty Stanley M.D.   On: 07/08/2017 08:01  Assessment/Plan: S/P Procedure(s) (LRB): THORACOTOMY MAJOR; EXPLORATION LEFT CHEST, LIGATION BLEEDING BRONCHIAL ARTERY, EVACUATION HEMATOMA (Left)  Making slow but steady progress Breathing comfortably w/ O2 sats  99-100% on 1 L/min Maintaining NSR w/ stable BP Expected post op atelectasis, needs increased pulm toilet   Stop PCA  Stop IV amiodarone  Mobilize  Low dose lovenox  Resume Eliquis at time of hospital d/c  Transfer 4E   Rexene Alberts, MD 07/08/2017 9:42 AM

## 2017-07-08 NOTE — Progress Notes (Addendum)
Pt called RN to room very upset and distressed that she called out and was not answered in a timely manner. She reports that "no one has been into see her since 5:30pm". Both the NT Mendel Corning and myself have been at her the bedside intermittently to provide care since start of the shift. RN then asked charge nurse to speak with the patient. RN will continue to monitor.

## 2017-07-08 NOTE — Progress Notes (Signed)
Report called to [facility] 03. Ambulated patient 370 ft before transfer with RN. Tolerated very well. Pt transferred to 4E03. VSS.  Lucius Conn, RN

## 2017-07-09 ENCOUNTER — Inpatient Hospital Stay (HOSPITAL_COMMUNITY): Payer: Medicare Other

## 2017-07-09 MED ORDER — FUROSEMIDE 40 MG PO TABS
40.0000 mg | ORAL_TABLET | Freq: Every day | ORAL | Status: DC
  Administered 2017-07-10: 40 mg via ORAL
  Filled 2017-07-09 (×3): qty 1

## 2017-07-09 MED ORDER — WARFARIN - PHYSICIAN DOSING INPATIENT
Freq: Every day | Status: DC
  Administered 2017-07-10 – 2017-07-12 (×3)

## 2017-07-09 MED ORDER — KETOROLAC TROMETHAMINE 15 MG/ML IJ SOLN
15.0000 mg | Freq: Three times a day (TID) | INTRAMUSCULAR | Status: DC | PRN
  Filled 2017-07-09: qty 1

## 2017-07-09 MED ORDER — POTASSIUM CHLORIDE CRYS ER 20 MEQ PO TBCR
20.0000 meq | EXTENDED_RELEASE_TABLET | Freq: Two times a day (BID) | ORAL | Status: DC
  Administered 2017-07-09 – 2017-07-11 (×5): 20 meq via ORAL
  Filled 2017-07-09 (×6): qty 1

## 2017-07-09 MED ORDER — WARFARIN SODIUM 2.5 MG PO TABS
2.5000 mg | ORAL_TABLET | Freq: Once | ORAL | Status: AC
  Administered 2017-07-09: 2.5 mg via ORAL
  Filled 2017-07-09: qty 1

## 2017-07-09 MED ORDER — FUROSEMIDE 10 MG/ML IJ SOLN
40.0000 mg | Freq: Once | INTRAMUSCULAR | Status: AC
  Administered 2017-07-09: 40 mg via INTRAVENOUS
  Filled 2017-07-09: qty 4

## 2017-07-09 MED ORDER — AMIODARONE HCL 200 MG PO TABS
400.0000 mg | ORAL_TABLET | Freq: Two times a day (BID) | ORAL | Status: DC
  Administered 2017-07-09 – 2017-07-14 (×10): 400 mg via ORAL
  Filled 2017-07-09 (×11): qty 2

## 2017-07-09 NOTE — Progress Notes (Signed)
Patient is requesting a breakthrough pain medication. Pt mentioned the Toradol that was automatically discontinued after five days post op. Please renew.

## 2017-07-09 NOTE — Progress Notes (Signed)
Pt converted to sinus rhythm. Now in sinus brady.

## 2017-07-09 NOTE — Progress Notes (Addendum)
      [address]       [place] [address]             [phone]      5 Days Post-Op Procedure(s) (LRB): THORACOTOMY MAJOR; EXPLORATION LEFT CHEST, LIGATION BLEEDING BRONCHIAL ARTERY, EVACUATION HEMATOMA (Left)   Subjective:  Ms. Gerety is doing okay.  She had multiple complaints over night.  I addressed all issues, questions answered.  Overall unhappy with her nursing care overnight  Objective: Vital signs in last 24 hours: Temp:  [98.2 F (36.8 C)-98.4 F (36.9 C)] 98.3 F (36.8 C) (08/27 0424) Pulse Rate:  [51-130] 53 (08/27 0743) Cardiac Rhythm: Sinus bradycardia (08/27 0743) Resp:  [11-26] 15 (08/27 0743) BP: (113-176)/(47-103) 132/85 (08/27 0424) SpO2:  [98 %-100 %] 99 % (08/27 0743) Weight:  [171 lb 4.8 oz (77.7 kg)] 171 lb 4.8 oz (77.7 kg) (08/26 1823)  Intake/Output from previous day: 08/26 0701 - 08/27 0700 In: 721.8 [P.O.:610; I.V.:111.8] Out: 450 [Urine:450]  General appearance: alert, cooperative and no distress Heart: regular rate and rhythm Lungs: clear to auscultation bilaterally Abdomen: soft, non-tender; bowel sounds normal; no masses,  no organomegaly Extremities: edema + edema Wound: clean and dry  Lab Results:  Recent Labs  07/07/17 0502 07/08/17 0427  WBC 8.0 8.7  HGB 9.2* 9.4*  HCT 27.6* 28.8*  PLT 164 199   BMET:  Recent Labs  07/08/17 0427  NA 135  K 3.7  CL 99*  CO2 30  GLUCOSE 108*  BUN 7  CREATININE 0.51  CALCIUM 8.2*    PT/INR: No results for input(s): LABPROT, INR in the last 72 hours. ABG    Component Value Date/Time   PHART 7.391 07/05/2017 0429   HCO3 25.1 07/05/2017 0429   TCO2 26 07/05/2017 1818   ACIDBASEDEF 6.0 (H) 09/16/2007 2215   O2SAT 99.0 07/05/2017 0429   CBG (last 3)   Recent Labs  07/07/17 0829 07/07/17 1233 07/07/17 1627  GLUCAP 88 103* 114*    Assessment/Plan: S/P Procedure(s) (LRB): THORACOTOMY MAJOR; EXPLORATION LEFT CHEST, LIGATION BLEEDING BRONCHIAL ARTERY,  EVACUATION HEMATOMA (Left)  1. CV- chronic, A. Fib, currently Sinus Brady- Amiodarone, toprol XL, Imdur 2. Pulm- no acute issues, oxygen on per patient request, CXR with pulmonary edema 3. Renal- creatinine WNL, she is edematous on exam, will give IV Lasix, today, continue potassium supplementation 4. Pain control- requesting toradol for breakthrough, will give a few more doses as creatinine is stable 5. Dispo- patient stable, NSR... Will resume coumadin at discharge per patient does not want to change to Eliquis, diurese, possibly ready for d/c soon    LOS: 7 days    Ellwood Handler 07/09/2017   Chart reviewed, patient examined, agree with above. CXR looks ok but some interstitial edema and small effusions, bibasilar mild atelectasis. Weight is 18 lbs over preop with edema in legs. Given dose of IV lasix this am and will put on daily oral lasix. Need to get her back to preop weight gradually. Replace K+. She is still having episodes of atrial fib with RVR. Will increase amio back to 400 bid. Resume Coumadin 2.5 mg today. I think she is probably a few days away from going home. She needs to wean off oxygen, diurese and have atrial fib under control.

## 2017-07-09 NOTE — Progress Notes (Signed)
Asked for her inhaler in the AM instead of PM today

## 2017-07-09 NOTE — Discharge Summary (Signed)
Physician Discharge Summary       [address]       [place] [address]             [phone]    Patient ID: [name] MRN: [id number] DOB/AGE: February 17, 1946 71 y.o.  Admit date: 07/02/2017 Discharge date: 07/14/2017  Admission Diagnoses: Left lower lobe lung nodule  Active Diagnoses:  1. Squamous cell carcinoma LLL 2. Anemia 3. Atrial flutter (HCC)-Dx 12/2016 s/p DCCV 4. Basal cell carcinoma of chest wall 5. Broken neck ([place]) 6. Anxiety 7. CAD (coronary artery disease)-s/p CABGx3 2008 8. Colostomy in place ([place]) 9. Compression fracture of T12 vertebra (HCC) 10. DDD (degenerative disc disease), cervical 11. DDD (degenerative disc disease), lumbosacral 12. Eczema 13. Headache(784.0) 14. Hypertension 15. High cholesterol 16. Myocardial infarction ([place]) 17. PVD (peripheral vascular disease) ([place]) 18. Seizures ([place]) 19. Sjogren's disease ([place]) 20. PAF (paroxysmal atrial fibrillation) ([place]) 21. Osteopenia 22. Tobacco abuse  Procedure (s):  1.  Left muscle-sparing thoracotomy 2.  Left lower lobectomy by Dr. [name] on 07/02/2017.  3. 1. Exploration of left chest, evacuation of hemothorax, ligation of bleeding bronchial artery by Dr. Barrie Folk 07/04/2017.  Pathology: 1. Lymph node, biopsy, 10 L ONE LYMPH NODE, NEGATIVE FOR CARCINOMA (0/1) 2. Lung, resection (segmental or lobe), Left lower lobe MODERATELY DIFFERENTIATED SQUAMOUS CELL CARCINOMA, 1.3 CM (PT1B) LYMPHOVASCULAR INVASION IS IDENTIFIED BRONCHIAL AND VASCULAR RESECTION MARGINS ARE NEGATIVE FOR CARCINOMA FOUR BENIGN LYMPH NODES (0/4)  TNM code: pT1b , pN0  History of Presenting Illness: The patient is a 71 year old woman with a history of prior smoking until 2008 ( 40 pk-yrs), CAD s/p CABG by me in 2008, HTN and hypercholesterolemia, PAF and atrial flutter s/p DCCV in 12/2016 on chronic Coumadin who was seen by Dr. Lake Bells in February for shortness of breath. She had PFT's showing moderate  airflow obstruction and had a lung cancer screening CT on 02/20/2017 which showed an 8.8 mm LLL lung nodule. A follow up CT on 05/23/2017 showed an increase in the size of this nodule to 10.9 mm. There was no mediastinal or hilar adenopathy. She had a PET scan on 05/30/2017 which showed the nodule to be hypermetabolic with an SUV max of 4.3. There was no other hypermetabolic activity. She says that her breathing has been improved with an Incruse inhaler.   She underwent a cardiac workup this past spring due to her shortness of breath and cardiac history. A nuclear stress test on 01/09/2017 was negative for ischemia. An exercise tolerance test on 03/01/2017 was negative. Her last echo in 11/2015 showed normal LV systolic function.  This 71 year old woman has an enlarging hypermetabolic spiculated nodule in the LLL of the lung that is suspicious for bronchogenic carcinoma. Dr. [name] personally reviewed and interpreted her CT and PET scans and have reviewed the images with her. The nodule is 10.9 mm and has enlarged over a 3 month period with hypermetabolic activity. There is no other hypermetabolic activity to suggest metastatic disease. Her PFT's show moderate COPD but Dr. [name] thought were adequate to allow left lower lobectomy. The location of this lesion is probably not amenable to wedge resection and XRT would be an inferior treatment for her. She does have a cardiac history as outlined above but has had a recent nuclear stress and ETT both of which were negative.  Dr. [name] discussed the need for muscle sparing LLL. Potential risks, benefits, and complications of the surgery were discussed with the  patient and she agreed to proceed with surgery. She was admitted on 07/02/2017 in order to undergo the aforementioned surgery.  Brief Hospital Course:  The patient remained afebrile and hemodynamically stable. A line and foley were removed early in the post operative course. She had an episode the morning of  08/22 which she felt bad and her chest tube output increased to 300 cc of drainage and it was bloody. In addition, her H and H decreased from 10.9 and 32.6 to 9.4 and 28.7. Dr. [name] made her NPO and obtained a stat CXR. This showed a retained left hemothorax. He felt it was best to explore chest. This was done later on 07/04/2017. Findings at the time of surgery showed the bleeding was from a bronchial artery, which was ligated. Left chest hematoma was also evacuated. Daily chest x rays were obtained and remained stable. All chest tubes were removed by 07/07/2016. She did go into a fib on 08/25. She was put on Amiodarone. She was already on Toprol 25 mg XL and Imdur 30 mg daily, which she had taken prior to surgery. She continued to have issue with PAF.  She states she was previously difficult to control.  She requested Dr. Caryl Comes be consulted.  She was evaluated by EP who agreed with current treatment plan.  She ultimately converted NSR on 07/13/2017 and they did not feel any further intervention was indicated.  She was put  on Coumadin. Daily PT and INR were obtained. She is on 5 mg of Coumadin daily and her latest INR is 1.5. Per Dr. [name], patient will resume does of Coumadin she had take prior to surgery. HHRN has been arranged and is to draw a PT and INR on Tuesday 09/04 and call or fax results to Dr. Olin Pia office.   Patient desaturates off oxygen.  Home use has been arranged. She had small pleural effusions on chest x ray and increased LE edema. She was given Lasix accordingly. Patient is tolerating a diet and has had a bowel movement. Wounds are clean and dry. Final chest X ray showed decrease in pleural effusions, no pneumothorax.  Patient is felt surgically stable for discharge today.  Latest Vital Signs: Blood pressure (!) 122/54, pulse (!) 55, temperature 98.2 F (36.8 C), temperature source Oral, resp. rate 18, height 5\' 3"  (1.6 m), weight 157 lb 1.6 oz (71.3 kg), SpO2 100 %.  Physical Exam:   General appearance: alert, cooperative and no distress Heart: regular rate and rhythm Lungs: clear to auscultation bilaterally Abdomen: soft, non-tender; bowel sounds normal; no masses,  no organomegaly Extremities: edema + edema Wound: clean and dry  Discharge Condition:Stable and discharged to home.  Recent laboratory studies:  Lab Results  Component Value Date   WBC 8.7 07/08/2017   HGB 9.4 (L) 07/08/2017   HCT 28.8 (L) 07/08/2017   MCV 88.3 07/08/2017   PLT 199 07/08/2017   Lab Results  Component Value Date   NA 134 (L) 07/12/2017   K 4.2 07/12/2017   CL 96 (L) 07/12/2017   CO2 31 07/12/2017   CREATININE 0.58 07/12/2017   GLUCOSE 124 (H) 07/12/2017    Diagnostic Studies: Dg Chest 2 View  Result Date: 07/09/2017 CLINICAL DATA:  Follow-up atelectasis EXAM: CHEST  2 VIEW COMPARISON:  PA and lateral chest x-ray dated July 08, 2017 FINDINGS: The lungs are reasonably well inflated. Small bilateral pleural effusions persist but have decreased in size. There is persistent left lower retrocardiac density. The interstitial markings overall remain  coarse. There is no pneumothorax. The heart is normal in size. The pulmonary vascularity is not engorged. The sternal wires are intact. The left internal jugular venous catheter tip projects at the junction of the right and left brachiocephalic veins. There is calcification in the wall of the aortic arch. IMPRESSION: Decreasing bilateral pleural effusions. Persistent bibasilar atelectasis slightly improved. Persistent increased interstitial markings are consistent with low-grade interstitial edema or less likely pneumonia. No pneumothorax. Previous CABG.  Thoracic aortic atherosclerosis. Electronically Signed   By: David  Martinique M.D.   On: 07/09/2017 07:01   Discharge Medications: Allergies as of 07/14/2017      Reactions   Clindamycin/lincomycin Swelling   TROUBLE SWALLOWING......SEVERE CHEST PAIN   Crestor [rosuvastatin] Other (See Comments)    MYALGIAS LEG PAIN   Lipitor [atorvastatin] Other (See Comments)   MYALGIAS LEG PAIN   Phenergan [promethazine Hcl] Other (See Comments)   Nervous Leg / Restless Leg Syndrome   Reclast [zoledronic Acid] Other (See Comments)   Flu symptoms - made pt very sick, and had inflammation  In her eye   Penicillins Other (See Comments)   Blurred vision   Diltiazem Other (See Comments)   Weakness on oral Dilt   Latex Rash   Sulfa Antibiotics Photosensitivity, Rash   Burning Rash      Medication List    STOP taking these medications   lisinopril 10 MG tablet Commonly known as:  PRINIVIL,ZESTRIL   metoprolol tartrate 25 MG tablet Commonly known as:  LOPRESSOR     TAKE these medications   acetaminophen 500 MG tablet Commonly known as:  TYLENOL Take 500 mg by mouth every 8 (eight) hours as needed for mild pain or moderate pain.   amiodarone 200 MG tablet Commonly known as:  PACERONE Take 2 tablets (400 mg total) by mouth 2 (two) times daily. For one week then take 200 mg by mouth two times daily thereafter   aspirin EC 81 MG tablet Take 81 mg by mouth at bedtime.   CALTRATE 600+D 600-800 MG-UNIT Tabs Generic drug:  Calcium Carb-Cholecalciferol Take 1 tablet by mouth daily.   ezetimibe 10 MG tablet Commonly known as:  ZETIA TAKE 1 TABLET(10 MG) BY MOUTH EVERY EVENING   fluticasone 50 MCG/ACT nasal spray Commonly known as:  FLONASE Place 2 sprays into both nostrils daily as needed for allergies or rhinitis.   hydrocortisone valerate cream 0.2 % Commonly known as:  WESTCORT Apply 1 application topically 3 (three) times a week. On random days - for eczema in ear   isosorbide mononitrate 30 MG 24 hr tablet Commonly known as:  IMDUR Take 1 tablet (30 mg total) by mouth daily.   metoprolol succinate 50 MG 24 hr tablet Commonly known as:  TOPROL-XL Take 1 tablet (50 mg total) by mouth daily.   nitroGLYCERIN 0.4 MG SL tablet Commonly known as:  NITROSTAT Place 1 tablet  (0.4 mg total) under the tongue every 5 (five) minutes x 3 doses as needed for chest pain.   omega-3 acid ethyl esters 1 g capsule Commonly known as:  LOVAZA TAKE 1 CAPSULE BY MOUTH EVERY DAY AT NOON   oxyCODONE 5 MG immediate release tablet Commonly known as:  Oxy IR/ROXICODONE Take 5 mg by mouth every 4-6 hours PRN severe pain.   pyridOXINE 100 MG tablet Commonly known as:  VITAMIN B-6 Take 100 mg by mouth daily.   simvastatin 20 MG tablet Commonly known as:  ZOCOR TAKE 1 TABLET(20 MG) BY MOUTH DAILY  SYSTANE PRESERVATIVE FREE OP Place 1 drop into both eyes 2 (two) times daily.   umeclidinium bromide 62.5 MCG/INH Aepb Commonly known as:  INCRUSE ELLIPTA Inhale 1 puff into the lungs daily.   valACYclovir 500 MG tablet Commonly known as:  VALTREX Take 500 mg by mouth daily as needed. For cold sores   Vitamin D-3 5000 units Tabs Take 5,000 Units by mouth daily.   warfarin 2.5 MG tablet Commonly known as:  COUMADIN Take 1.25-2.5 mg by mouth daily. 1 tablet daily except 1/2 tablet each Sunday Tuesday and Thursday            Durable Medical Equipment        Start     Ordered   07/13/17 1023  For home use only DME Walker rolling  Once    Question:  Patient needs a walker to treat with the following condition  Answer:  Weakness   07/13/17 1022   07/13/17 1023  For home use only DME 3 n 1  Once     07/13/17 1022   07/13/17 0734  For home use only DME oxygen  Once    Question Answer Comment  Mode or (Route) Nasal cannula   Liters per Minute 2   Frequency Continuous (stationary and portable oxygen unit needed)   Oxygen conserving device Yes   Oxygen delivery system Gas      08 /31/18 0734       Discharge Care Instructions        Start     Ordered   07/14/17 0000  amiodarone (PACERONE) 200 MG tablet  2 times daily    Question:  Supervising Provider  Answer:  Gaye Pollack   07/14/17 0821   07/14/17 0000  oxyCODONE (OXY IR/ROXICODONE) 5 MG immediate  release tablet    Question:  Supervising Provider  Answer:  Gaye Pollack   07/14/17 1601      Follow Up Appointments: Follow-up Information    Gaye Pollack, MD Follow up on 07/25/2017.   Specialty:  Cardiothoracic Surgery Why:  PA/LAT CXR to be taken (at [facility] which is in the same building as Dr. Vivi Martens office, on the ground floor) on 07/25/2017 at 2:00 pm;Appointment time is at 2:30 pm Contact information: [address] [place] [address] [phone]        [facility] Follow up.   Why:  HHRN arranged- they will contact you to set up home visits. Please draw PT and INR (on Coumadin for a fib, INR 2-2.5) on Tuesday 09/04 and call or fax results to Dr. Olin Pia office.  Contact information: [place] [address] [facility] Follow up.   Why:  rolling walker and home 02 arranged- RW and portable 02 tank to be delivered to room prior to discharge Contact information: [address] [place] [address] [phone]        Deboraha Sprang, MD. Go on 07/25/2017.   Specialty:  Cardiology Why:  @ 10:20 am with Benjaman Pott PA/NP while Dr. Caryl Comes in office. call office if needs to reschedle. No opening with Dr. Caryl Comes until [place].  Contact information: [address] [place] [address] [phone]        Nurse Follow up on 07/19/2017.   Why:  Office will contact you with appointment time. Appointment is with nurse to have chest tube  sutures removed. Contact information: [place] [address]          Signed: Lars Pinks MPA-C 07/14/2017, 8:27 AM

## 2017-07-09 NOTE — Discharge Instructions (Signed)
Thoracotomy, Care After This sheet gives you information about how to care for yourself after your procedure. Your health care provider may also give you more specific instructions. If you have problems or questions, contact your health care provider. What can I expect after the procedure? After your procedure, it is common to have:  Pain and swelling around the incision area.  Pain when you breathe in (inhale).  Constipation.  Fatigue.  Loss of appetite.  Trouble sleeping.  Mood swings and depression.  Follow these instructions at home: Preventing pneumonia  Take deep breaths or do breathing exercises as instructed by your health care provider.  Cough frequently. Coughing may cause discomfort, but it is important to clear mucus (phlegm) and expand your lungs. If coughing hurts, hold a pillow against your chest or place both hands flat on top of the incision (splinting) when you cough. This may help relieve discomfort.  Continue to use an incentive spirometer as directed. This is a tool that measures how well you fill your lungs with each breath.  Participate in pulmonary rehabilitation as directed. This is a program that combines education, exercise, and support from a team of specialists. The goal is to help you heal and return to normal activities as soon as possible. Medicines  Take over-the-counter or prescription medicines only as told by your health care provider.  If you have pain, take pain-relieving medicine before your pain becomes severe. This is important because if your pain is under control, you will be able to breathe and cough more comfortably.  If you were prescribed an antibiotic medicine, take it as told by your health care provider. Do not stop taking the antibiotic even if you start to feel better. Activity  Ask your health care provider what activities are safe for you.  Do not travel by airplane for 2 weeks after your chest tube is removed, or until  your health care provider says that this is safe.  Do not lift anything that is heavier than 10 lb (4.5 kg), or the limit that your health care provider tells you, until he or she says that it is safe.  Do not drive until your health care provider approves. ? Do not drive or use heavy machinery while taking prescription pain medicine. Incision care  Follow instructions from your health care provider about how to take care of your incision. Make sure you: ? Wash your hands with soap and water before you change your bandage (dressing). If soap and water are not available, use hand sanitizer. ? Change your dressing as told by your health care provider. ? Leave stitches (sutures), skin glue, or adhesive strips in place. These skin closures may need to stay in place for 2 weeks or longer. If adhesive strip edges start to loosen and curl up, you may trim the loose edges. Do not remove adhesive strips completely unless your health care provider tells you to do that.  Keep your dressing dry.  Check your incision area every day for signs of infection. Check for: ? More redness, swelling, or pain. ? More fluid or blood. ? Warmth. ? Pus or a bad smell. Bathing  Do not take baths, swim, or use a hot tub until your health care provider approves. You may take showers.  After your dressing has been removed, use soap and water to gently wash your incision area. Do not use anything else to clean your incision unless your health care provider tells you to do that. Eating and  drinking  Eat a healthy diet as instructed by your health care provider. A healthy diet includes plenty of fresh fruits and vegetables, whole grains, and low-fat (lean) proteins.  Drink enough fluid to keep your urine clear or pale yellow. General instructions  To prevent or treat constipation while you are taking prescription pain medicine, your health care provider may recommend that you: ? Take over-the-counter or prescription  medicines. ? Eat foods that are high in fiber, such as fresh fruits and vegetables, whole grains, and beans. ? Limit foods that are high in fat and processed sugars, such as fried and sweet foods.  Do not use any products that contain nicotine or tobacco, such as cigarettes and e-cigarettes. If you need help quitting, ask your health care provider.  Avoid secondhand smoke.  Wear compression stockings as told by your health care provider. These stockings help to prevent blood clots and reduce swelling in your legs.  If you have a chest tube, care for it as instructed.  Keep all follow-up visits as told by your health care provider. This is important. Contact a health care provider if:  You have more redness, swelling, or pain around your incision.  You have more fluid or blood coming from your incision.  Your incision feels warm to the touch.  You have pus or a bad smell coming from your incision.  You have a fever or chills.  Your heartbeat seems irregular.  You have nausea or vomiting.  You have muscle aches.  You are constipated. This may mean that you have: ? Fewer bowel movements in a week than normal. ? Difficulty having a bowel movement. ? Stools that are dry, hard, or larger than normal. Get help right away if:  You develop a rash.  You feel light-headed or feel like you are going to faint.  You have shortness of breath or trouble breathing.  You are confused.  You have trouble speaking.  You have vision problems.  You are not able to move.  You have numbness in your face, arms, or legs.  You lose consciousness.  You have a sudden, severe headache.  You feel weak.  You have chest pain.  You have pain that: ? Is severe. ? Gets worse, even with medicine. Summary  To prevent pneumonia, take deep breaths, do breathing exercises, and cough frequently, as instructed by your health care provider.  Do not drive until your health care provider  approves. Do not travel by airplane for 2 weeks after your chest tube is removed, or until your health care provider approves.  Check your incision area every day for signs of infection.  Eat a healthy diet that includes plenty of fresh fruits and vegetables, whole grains, and low-fat (lean) proteins. This information is not intended to replace advice given to you by your health care provider. Make sure you discuss any questions you have with your health care provider. Document Released: 04/14/2011 Document Revised: 07/24/2016 Document Reviewed: 07/24/2016 Elsevier Interactive Patient Education  2017 [employer].

## 2017-07-09 NOTE — Progress Notes (Signed)
Pt woke up ask for pain medication and uses the restroom to void. We continue to monitor.

## 2017-07-09 NOTE — Care Management Important Message (Signed)
Important Message  Patient Details  Name: [name] MRN: [id number] Date of Birth: 03/08/1946   Medicare Important Message Given:  Yes    Orbie Pyo 07/09/2017, 1:54 PM

## 2017-07-09 NOTE — Progress Notes (Addendum)
Pt noted to be in a controlled a.fibb of 90-110's. Dr. Roxy Manns called and updated on patient status, no new orders received at this time. Dr. Roxy Manns states "she has a history of a.fibb and I don't need to know about this". Pt updated and extremely upset that "IV medicine" was not initiated. Pt also upset that she is not on any blood thinner. RN attempted to explain that MD and other staff is aware of her condition and d/t her risk of bleeding, and return to the OR on 8/22 will be restarted on a blood thinner at the discretion of the surgeon. Pt still unhappy with this answer, and charge RN asked yet again to please speak with this patient. RN will continue to monitor.

## 2017-07-10 LAB — BASIC METABOLIC PANEL
Anion gap: 7 (ref 5–15)
BUN: 5 mg/dL — ABNORMAL LOW (ref 6–20)
CO2: 33 mmol/L — ABNORMAL HIGH (ref 22–32)
Calcium: 8.4 mg/dL — ABNORMAL LOW (ref 8.9–10.3)
Chloride: 96 mmol/L — ABNORMAL LOW (ref 101–111)
Creatinine, Ser: 0.51 mg/dL (ref 0.44–1.00)
GFR calc Af Amer: >60 mL/min (ref >60)
GFR calc non Af Amer: >60 mL/min (ref >60)
Glucose, Bld: 105 mg/dL — ABNORMAL HIGH (ref 65–99)
Potassium: 3.7 mmol/L (ref 3.5–5.1)
Sodium: 136 mmol/L (ref 135–145)

## 2017-07-10 LAB — PROTIME-INR
INR: 1.19
Prothrombin Time: 15.1 seconds (ref 11.4–15.2)

## 2017-07-10 MED ORDER — UMECLIDINIUM BROMIDE 62.5 MCG/INH IN AEPB
1.0000 | INHALATION_SPRAY | Freq: Every day | RESPIRATORY_TRACT | Status: DC
  Administered 2017-07-10 – 2017-07-14 (×3): 1 via RESPIRATORY_TRACT
  Filled 2017-07-10: qty 7

## 2017-07-10 MED ORDER — METOPROLOL TARTRATE 5 MG/5ML IV SOLN: 2.5000 mg | INTRAVENOUS | Status: DC | PRN

## 2017-07-10 MED ORDER — FUROSEMIDE 40 MG PO TABS
40.0000 mg | ORAL_TABLET | Freq: Once | ORAL | Status: AC
  Administered 2017-07-10: 40 mg via ORAL

## 2017-07-10 MED ORDER — WARFARIN SODIUM 2.5 MG PO TABS
1.2500 mg | ORAL_TABLET | Freq: Once | ORAL | Status: DC
  Filled 2017-07-10: qty 0.5

## 2017-07-10 NOTE — Progress Notes (Addendum)
Assumed care from off going RN ; patient alert & oriented; patient denies SOB but c/o's pain 5 at surgical site; oxycodone 2 tabs given; HOB elevated ; bed in lowest position; call bell in reach  Late entry: patient ambulated in highway during morning shift; desated down into 87 and O2 was applied; patient ambulated back to room with no distress; HR stayed in the 120's  Late shift: asked night RN to give Warfarin due to it was not available when look for before shift ended.

## 2017-07-10 NOTE — Progress Notes (Addendum)
      [address]       [place] [address]             [phone]      6 Days Post-Op Procedure(s) (LRB): THORACOTOMY MAJOR; EXPLORATION LEFT CHEST, LIGATION BLEEDING BRONCHIAL ARTERY, EVACUATION HEMATOMA (Left)   Subjective:  No new complaints.  She states she slept great last night, which was really needed.  She remains reluctant to get off oxygen, due to previous issues with unresponsiveness after IV medication.  I explained to her that currently she has adequate oxygen levels and would meed qualifications to not be on oxygen.    Objective: Vital signs in last 24 hours: Temp:  [98.6 F (37 C)-98.9 F (37.2 C)] 98.6 F (37 C) (08/28 0537) Pulse Rate:  [58-64] 64 (08/28 0800) Cardiac Rhythm: Normal sinus rhythm (08/28 0800) Resp:  [16-21] 17 (08/28 0800) BP: (101-139)/(48-88) 101/88 (08/28 0800) SpO2:  [97 %-100 %] 97 % (08/28 0800)  Intake/Output from previous day: 08/27 0701 - 08/28 0700 In: 240 [P.O.:240] Out: -   General appearance: alert, cooperative and no distress Heart: regular rate and rhythm Lungs: clear to auscultation bilaterally Abdomen: soft, non-tender; bowel sounds normal; no masses,  no organomegaly Extremities: edema bilateral mild pitting Wound: clean and dry  Lab Results:  Recent Labs  07/08/17 0427  WBC 8.7  HGB 9.4*  HCT 28.8*  PLT 199   BMET:  Recent Labs  07/08/17 0427 07/10/17 0422  NA 135 136  K 3.7 3.7  CL 99* 96*  CO2 30 33*  GLUCOSE 108* 105*  BUN 7 5*  CREATININE 0.51 0.51  CALCIUM 8.2* 8.4*    PT/INR:  Recent Labs  07/10/17 0422  LABPROT 15.1  INR 1.19   ABG    Component Value Date/Time   PHART 7.391 07/05/2017 0429   HCO3 25.1 07/05/2017 0429   TCO2 26 07/05/2017 1818   ACIDBASEDEF 6.0 (H) 09/16/2007 2215   O2SAT 99.0 07/05/2017 0429   CBG (last 3)   Recent Labs  07/07/17 1233 07/07/17 1627  GLUCAP 103* 114*    Assessment/Plan: S/P Procedure(s) (LRB): THORACOTOMY MAJOR;  EXPLORATION LEFT CHEST, LIGATION BLEEDING BRONCHIAL ARTERY, EVACUATION HEMATOMA (Left)  1. CV- Chronic PAF, currently NSR- continue Amiodarone, Lopressor 2. INR 1.19, will order 1.25 mg tonight which is patients home dose 3. Renal-creatinine remains stable, will give lasix at 40 mg BID, continue to supplement K.. Repeat BMET in AM 4. Pulm- no acute issues, on indications for oxygen, will wean, instructed nursing to document saturations without oxygen 5. Dispo- patient stable, continue coumadin on home regimen, continue diuresis, supplement K, continue current care   LOS: 8 days    BARRETT, ERIN 07/10/2017   Chart reviewed, patient examined, agree with above. She says she walked yesterday without oxygen but did not get far and oxygen levels dropped but I don't see any documentation of that. All of her sats on flowsheet have been high 90's to 100 on 2L. She says she feels less swollen after the IV lasix yesterday but no weights or I/O's so who knows. Will continue oral lasix and weigh her today. Maintaining sinus rhythm.

## 2017-07-11 DIAGNOSIS — I480 Paroxysmal atrial fibrillation: Secondary | ICD-10-CM

## 2017-07-11 LAB — BASIC METABOLIC PANEL
Anion gap: 11 (ref 5–15)
BUN: 6 mg/dL (ref 6–20)
CO2: 33 mmol/L — ABNORMAL HIGH (ref 22–32)
Calcium: 8.4 mg/dL — ABNORMAL LOW (ref 8.9–10.3)
Chloride: 91 mmol/L — ABNORMAL LOW (ref 101–111)
Creatinine, Ser: 0.55 mg/dL (ref 0.44–1.00)
GFR calc Af Amer: >60 mL/min (ref >60)
GFR calc non Af Amer: >60 mL/min (ref >60)
Glucose, Bld: 105 mg/dL — ABNORMAL HIGH (ref 65–99)
Potassium: 3.3 mmol/L — ABNORMAL LOW (ref 3.5–5.1)
Sodium: 135 mmol/L (ref 135–145)

## 2017-07-11 LAB — PROTIME-INR
INR: 1.06
Prothrombin Time: 13.7 seconds (ref 11.4–15.2)

## 2017-07-11 MED ORDER — MAGNESIUM OXIDE 400 (241.3 MG) MG PO TABS
400.0000 mg | ORAL_TABLET | Freq: Two times a day (BID) | ORAL | Status: DC
  Administered 2017-07-11: 400 mg via ORAL
  Filled 2017-07-11: qty 1

## 2017-07-11 MED ORDER — POTASSIUM CHLORIDE 20 MEQ/15ML (10%) PO SOLN
40.0000 meq | Freq: Once | ORAL | Status: AC
  Administered 2017-07-11: 40 meq via ORAL
  Filled 2017-07-11: qty 30

## 2017-07-11 MED ORDER — METOPROLOL SUCCINATE ER 50 MG PO TB24
50.0000 mg | ORAL_TABLET | Freq: Every day | ORAL | Status: DC
  Administered 2017-07-12 – 2017-07-13 (×2): 50 mg via ORAL
  Filled 2017-07-11 (×3): qty 1

## 2017-07-11 MED ORDER — AMIODARONE IV BOLUS ONLY 150 MG/100ML
150.0000 mg | Freq: Once | INTRAVENOUS | Status: AC
  Administered 2017-07-11: 150 mg via INTRAVENOUS
  Filled 2017-07-11: qty 100

## 2017-07-11 MED ORDER — POTASSIUM CHLORIDE 20 MEQ/15ML (10%) PO SOLN
80.0000 meq | Freq: Once | ORAL | Status: AC
  Administered 2017-07-11: 80 meq via ORAL
  Filled 2017-07-11: qty 60

## 2017-07-11 MED ORDER — POTASSIUM CHLORIDE CRYS ER 20 MEQ PO TBCR: 80.0000 meq | EXTENDED_RELEASE_TABLET | Freq: Once | ORAL | Status: DC

## 2017-07-11 MED ORDER — WARFARIN SODIUM 2.5 MG PO TABS
2.5000 mg | ORAL_TABLET | Freq: Every day | ORAL | Status: DC
  Administered 2017-07-11: 2.5 mg via ORAL
  Filled 2017-07-11: qty 1

## 2017-07-11 MED ORDER — FUROSEMIDE 40 MG PO TABS
40.0000 mg | ORAL_TABLET | Freq: Every day | ORAL | Status: DC
  Administered 2017-07-11: 40 mg via ORAL
  Filled 2017-07-11: qty 1

## 2017-07-11 NOTE — Progress Notes (Addendum)
[address]       [place] [address]             [phone]      7 Days Post-Op Procedure(s) (LRB): THORACOTOMY MAJOR; EXPLORATION LEFT CHEST, LIGATION BLEEDING BRONCHIAL ARTERY, EVACUATION HEMATOMA (Left)   Subjective:  Patient again slept pretty good.  She again had issues overnight with nursing staff, stating she did not get medications she was told she would, and she requested physician be called to get an order for the medication.  She also asks if it would be appropriate for Dr. Caryl Comes to be called in regards to her A. Fib  Objective: Vital signs in last 24 hours: Temp:  [98 F (36.7 C)-98.5 F (36.9 C)] 98.2 F (36.8 C) (08/29 0308) Pulse Rate:  [64-119] 103 (08/29 0308) Cardiac Rhythm: Atrial fibrillation (08/29 0726) Resp:  [16-21] 16 (08/29 0308) BP: (101-152)/(67-88) 152/80 (08/29 0308) SpO2:  [96 %-100 %] 98 % (08/29 0308) Weight:  [159 lb 6.4 oz (72.3 kg)-164 lb 9.6 oz (74.7 kg)] 159 lb 6.4 oz (72.3 kg) (08/29 0500)  Intake/Output from previous day: 08/28 0701 - 08/29 0700 In: 660 [P.O.:660] Out: 4150 [Urine:4150]  General appearance: alert, cooperative and no distress Heart: irregularly irregular rhythm Lungs: clear to auscultation bilaterally Abdomen: soft, non-tender; bowel sounds normal; no masses,  no organomegaly Extremities: edema trace Wound: clean and dry  Lab Results: No results for input(s): WBC, HGB, HCT, PLT in the last 72 hours. BMET:  Recent Labs  07/10/17 0422 07/11/17 0343  NA 136 135  K 3.7 3.3*  CL 96* 91*  CO2 33* 33*  GLUCOSE 105* 105*  BUN 5* 6  CREATININE 0.51 0.55  CALCIUM 8.4* 8.4*    PT/INR:  Recent Labs  07/11/17 0343  LABPROT 13.7  INR 1.06   ABG    Component Value Date/Time   PHART 7.391 07/05/2017 0429   HCO3 25.1 07/05/2017 0429   TCO2 26 07/05/2017 1818   ACIDBASEDEF 6.0 (H) 09/16/2007 2215   O2SAT 99.0 07/05/2017 0429   CBG (last 3)  No results for input(s): GLUCAP in the  last 72 hours.  Assessment/Plan: S/P Procedure(s) (LRB): THORACOTOMY MAJOR; EXPLORATION LEFT CHEST, LIGATION BLEEDING BRONCHIAL ARTERY, EVACUATION HEMATOMA (Left)  1. CV- A. Fib rate in the 110s- continue Amiodarone 400 mg BID, Toprol XL at 25 mg, Imdur---may benefit from increase in Toprol XL or change to BID dosing. However this concern with bradycardia when the patient converts will defer to Dr. [name] 2. Pulm- wean oxygen as tolerated, patient desats into mid 80s with ambulation 3. INR- 1.06, wil give 2.5 mg tonight.. If no response will increase coumadin dose 4. Renal- creatinine remains stable, weight is trending down, will continue Lasix daily 5. Hypokalemia- will supplement K 6. Dispo- patient remains in rate controlled A. Fib, titrate BB if okay with Dr. [name], continue coumadin, diuresis   LOS: 9 days    BARRETT, [name] 07/11/2017   Chart reviewed, patient examined, agree with above. She looks like she is in atrial flutter this am. Will give her an extra bolus of amio IV and continue po dose. Will increased her Toprol to 50 mg daily which is her home dose. She is sinus in the 50-60's when she converts. She has been followed by Dr. Caryl Comes and according to her and daughter she has been difficult to control in the past and was cardioverted last Feb. Will ask EP to see to help with  management as requested by patient. Replete K+. She has not been ambulating much due to atrial fib with RVR at which time she feels weak and short of breath.

## 2017-07-11 NOTE — Progress Notes (Addendum)
Assumed care from off going RN @ [phone]; patient resting; @0838  assisted patient to [facility] with no signs of distress; patient up in chair with O2 @ 2L; ST on monitor; MD round already this AM; patient has c/o'pain; will give pain medication   See charting for ambulating in highway; SATs 86% on room air
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
## 2017-07-12 ENCOUNTER — Encounter (HOSPITAL_COMMUNITY): Payer: Self-pay

## 2017-07-12 LAB — BASIC METABOLIC PANEL
Anion gap: 7 (ref 5–15)
BUN: 7 mg/dL (ref 6–20)
CO2: 31 mmol/L (ref 22–32)
Calcium: 8.2 mg/dL — ABNORMAL LOW (ref 8.9–10.3)
Chloride: 96 mmol/L — ABNORMAL LOW (ref 101–111)
Creatinine, Ser: 0.58 mg/dL (ref 0.44–1.00)
GFR calc Af Amer: >60 mL/min (ref >60)
GFR calc non Af Amer: >60 mL/min (ref >60)
Glucose, Bld: 124 mg/dL — ABNORMAL HIGH (ref 65–99)
Potassium: 4.2 mmol/L (ref 3.5–5.1)
Sodium: 134 mmol/L — ABNORMAL LOW (ref 135–145)

## 2017-07-12 LAB — PROTIME-INR
INR: 1.07
Prothrombin Time: 13.8 seconds (ref 11.4–15.2)

## 2017-07-12 MED ORDER — WARFARIN SODIUM 5 MG PO TABS
5.0000 mg | ORAL_TABLET | Freq: Every day | ORAL | Status: DC
  Administered 2017-07-12 – 2017-07-13 (×2): 5 mg via ORAL
  Filled 2017-07-12 (×2): qty 1

## 2017-07-12 NOTE — Care Management Important Message (Signed)
Important Message  Patient Details  Name: [name] MRN: [id number] Date of Birth: [birth year]   Medicare Important Message Given:  Yes    Nathen May 07/12/2017, 9:41 AM

## 2017-07-12 NOTE — Progress Notes (Addendum)
[address]       [place] [address]             [phone]      8 Days Post-Op Procedure(s) (LRB): THORACOTOMY MAJOR; EXPLORATION LEFT CHEST, LIGATION BLEEDING BRONCHIAL ARTERY, EVACUATION HEMATOMA (Left) Subjective: Feels well, had a good night  Objective: Vital signs in last 24 hours: Temp:  [98.2 F (36.8 C)-98.9 F (37.2 C)] 98.7 F (37.1 C) (08/30 0430) Pulse Rate:  [99-127] 106 (08/30 0300) Cardiac Rhythm: Atrial fibrillation (08/30 0751) Resp:  [18-25] 18 (08/30 0430) BP: (101-146)/(64-82) 101/66 (08/30 0430) SpO2:  [86 %-100 %] 95 % (08/30 0300) Weight:  [158 lb 8 oz (71.9 kg)] 158 lb 8 oz (71.9 kg) (08/30 0430)  Hemodynamic parameters for last 24 hours:    Intake/Output from previous day: 08/29 0701 - 08/30 0700 In: 650 [P.O.:480; I.V.:170] Out: 3900 [Urine:3900] Intake/Output this shift: No intake/output data recorded.  General appearance: alert, cooperative and no distress Heart: regular rate and rhythm Lungs: mildly dim in bases Abdomen: soft, nontender Extremities: trace edema Wound: incis healing well  Lab Results: No results for input(s): WBC, HGB, HCT, PLT in the last 72 hours. BMET:  Recent Labs  07/11/17 0343 07/12/17 0403  NA 135 134*  K 3.3* 4.2  CL 91* 96*  CO2 33* 31  GLUCOSE 105* 124*  BUN 6 7  CREATININE 0.55 0.58  CALCIUM 8.4* 8.2*    PT/INR:  Recent Labs  07/12/17 0403  LABPROT 13.8  INR 1.07   ABG    Component Value Date/Time   PHART 7.391 07/05/2017 0429   HCO3 25.1 07/05/2017 0429   TCO2 26 07/05/2017 1818   ACIDBASEDEF 6.0 (H) 09/16/2007 2215   O2SAT 99.0 07/05/2017 0429   CBG (last 3)  No results for input(s): GLUCAP in the last 72 hours.  Meds Scheduled Meds: . amiodarone  400 mg Oral BID  . bisacodyl  10 mg Oral Daily  . calcium-vitamin D  1 tablet Oral Daily  . Chlorhexidine Gluconate Cloth  6 each Topical Daily  . cholecalciferol  5,000 Units Oral Daily  . enoxaparin  (LOVENOX) injection  30 mg Subcutaneous Q24H  . ezetimibe  10 mg Oral Daily  . isosorbide mononitrate  30 mg Oral Daily  . metoprolol succinate  50 mg Oral Daily  . omega-3 acid ethyl esters  1 g Oral Daily  . pyridOXINE  100 mg Oral Daily  . senna-docusate  1 tablet Oral QHS  . simvastatin  20 mg Oral q1800  . sodium chloride flush  10 mL Intracatheter Q12H  . sodium chloride flush  10-40 mL Intracatheter Q12H  . umeclidinium bromide  1 puff Inhalation Daily  . warfarin  2.5 mg Oral q1800  . Warfarin - Physician Dosing Inpatient   Does not apply q1800   Continuous Infusions: PRN Meds:.fluticasone, lactulose, levalbuterol, metoprolol tartrate, oxyCODONE  Xrays No results found.  Assessment/Plan: S/P Procedure(s) (LRB): THORACOTOMY MAJOR; EXPLORATION LEFT CHEST, LIGATION BLEEDING BRONCHIAL ARTERY, EVACUATION HEMATOMA (Left)  1 doing well 2 tachy aflutter at times, EP assisting with management. K+ is 4.2 today 3 INR 1.07- will increase coumadin dose today to 5 4 adeq control of sugars 5 cont routine pulm toilet and rehab  LOS: 10 days    GOLD,WAYNE E 07/12/2017   Chart reviewed, patient examined, agree with above. She feels better today and walked once. Her daughter said sats were 61 on 1L and dropped to 83 off  oxygen but I can't find any documentation about that in the chart. She will need home oxygen. She wants to walk more but says when she asks to walk there is no one available or no oxygen available. She remains in atrial flutter 90's to 120 on amio and Toprol. Will have to see if EP wants to do anything else. Central line needs to come out in the am. She has poor IV access so she needs a PICC if she needs to get any further IV therapy. She would probably be fine without an IV unless EP is planning anything else. Discussed plans with patient and daughter.

## 2017-07-13 LAB — PROTIME-INR
INR: 1.16
Prothrombin Time: 14.8 seconds (ref 11.4–15.2)

## 2017-07-13 MED ORDER — ACETAMINOPHEN 325 MG PO TABS
650.0000 mg | ORAL_TABLET | Freq: Four times a day (QID) | ORAL | Status: DC | PRN
  Administered 2017-07-13: 650 mg via ORAL
  Filled 2017-07-13: qty 2

## 2017-07-13 MED ORDER — POLYETHYLENE GLYCOL 3350 17 G PO PACK
17.0000 g | PACK | Freq: Every day | ORAL | Status: DC
  Administered 2017-07-13 – 2017-07-14 (×2): 17 g via ORAL
  Filled 2017-07-13 (×3): qty 1

## 2017-07-13 MED ORDER — BISACODYL 10 MG RE SUPP
10.0000 mg | Freq: Every day | RECTAL | Status: DC | PRN
  Administered 2017-07-13: 10 mg via RECTAL

## 2017-07-13 NOTE — Care Management Note (Signed)
Case Management Note Original Note Created Zenon Mayo, RN 07/03/2017, 3:23 PM  Patient Details  Name: [name] MRN: [id number] Date of Birth: January 13, 1946  Subjective/Objective:   From home alone, POD 1 thoracotomy/ left lower lobectomy , has chest tube to water seal, advancing diet.                 Action/Plan: NCM will follow for dc needs.   Expected Discharge Date:     07/13/17             Expected Discharge Plan:  [place]  In-House Referral:     Discharge planning Services  CM Consult  Post Acute Care Choice:  Durable Medical Equipment, Home Health Choice offered to:  Patient  DME Arranged:  3-N-1, Oxygen, Walker rolling DME Agency:  [place]:  RN [facility] Agency:  [facility]  Status of Service:  Completed, signed off  If discussed at [place] of Stay Meetings, dates discussed:  8/28, 8/30  Discharge Disposition: home/home health   Additional Comments:  07/12/17- 1000- Marvetta Gibbons RN, CM- pt for possible d/c later today vs in the am- orders have been place for [facility] and DME- spoke with pt at bedside- choice offered for [facility] agencies in [employer]- pt reviewed list with daughter over TC- per choice she would like to use [facility]- as she has used them in the past- discussed DME needs - pt feels like she would need a RW and 3n1 for home- home 02 order has also been placed- pt will need to qualify for home 02 prior to discharge for insurance to cover. RN to walk pt this AM to see if pt will qualify for 02. Referral called to Santiago Glad with [facility] for [facility]- and DME needs- RW and 3n1 to be delivered to room prior to discharge- if pt qualifies for home 02- portable tank will also be delivered to room for discharge. Pt to go to daughter's home for awhile- address is in [place]- [address]. Forrestine Him- [phone]- phone [phone]  Dawayne Patricia, [place] 07/13/2017, 11:05 AM [phone]

## 2017-07-13 NOTE — Progress Notes (Signed)
Pt to d/c to Daughter's home - Address is:- [address] [place] [address] Phone: [phone]

## 2017-07-13 NOTE — Progress Notes (Addendum)
      [address]       [place] [address]             [phone]      9 Days Post-Op Procedure(s) (LRB): THORACOTOMY MAJOR; EXPLORATION LEFT CHEST, LIGATION BLEEDING BRONCHIAL ARTERY, EVACUATION HEMATOMA (Left)   Subjective:  Patient doing okay this morning.  States has some back pain likely attributed to being in the bed.  She is worried about going home with continued episodes of Atrial  Fibrillation.  No BM since Saturday.  Objective: Vital signs in last 24 hours: Temp:  [98.7 F (37.1 C)] 98.7 F (37.1 C) (08/31 0632) Pulse Rate:  [64-120] 74 (08/31 0500) Cardiac Rhythm: Atrial flutter (08/31 0700) Resp:  [10-21] 10 (08/31 0500) BP: (112-132)/(69-85) 117/71 (08/31 0632) SpO2:  [92 %-97 %] 97 % (08/31 0500) Weight:  [157 lb 1.6 oz (71.3 kg)] 157 lb 1.6 oz (71.3 kg) (08/31 9767)  Intake/Output from previous day: 08/30 0701 - 08/31 0700 In: 360 [P.O.:360] Out: 1500 [Urine:1500]  General appearance: alert, cooperative and no distress Heart: regular rate and rhythm Lungs: clear to auscultation bilaterally Abdomen: soft, non-tender; bowel sounds normal; no masses,  no organomegaly Extremities: edema trace Wound: clean and dry  Lab Results: No results for input(s): WBC, HGB, HCT, PLT in the last 72 hours. BMET:  Recent Labs  07/11/17 0343 07/12/17 0403  NA 135 134*  K 3.3* 4.2  CL 91* 96*  CO2 33* 31  GLUCOSE 105* 124*  BUN 6 7  CREATININE 0.55 0.58  CALCIUM 8.4* 8.2*    PT/INR:  Recent Labs  07/13/17 0423  LABPROT 14.8  INR 1.16   ABG    Component Value Date/Time   PHART 7.391 07/05/2017 0429   HCO3 25.1 07/05/2017 0429   TCO2 26 07/05/2017 1818   ACIDBASEDEF 6.0 (H) 09/16/2007 2215   O2SAT 99.0 07/05/2017 0429   CBG (last 3)  No results for input(s): GLUCAP in the last 72 hours.  Assessment/Plan: S/P Procedure(s) (LRB): THORACOTOMY MAJOR; EXPLORATION LEFT CHEST, LIGATION BLEEDING BRONCHIAL ARTERY, EVACUATION HEMATOMA  (Left)  1. CV- PAF, currently NSR.. EP has No further recommendations, continue Amiodarone, Toprol XL, Imdur 2. INR 1.16, coumadin at 5 mg tonight 3. Pulm- desats with ambulation, will arrange home oxygen 4. GI- no BM since Saturday, patient is passing gas, Lactulose prn, Miralax daily... She is using narcotics around the clock 5. Deconditioning- home nursing for assessments, monitor of HR, Oxygen levels 6. Dispo- patient is medically stable, in NSR without further intervention indicated at this time per EP, not much response to coumadin, possibly ready for d/c this afternoon vs. tomorrow   LOS: 11 days    Ellwood Handler 07/13/2017   Chart reviewed, patient examined, agree with above. She feels much better today. Rhythm has remained sinus in the 50-60's with walking, BM, etc. Sats dropped to 79% on RA with ambulation so she will need home oxygen. Will remove central line tonight. If she remains stable she can go home in am. She will need HHRN to check INR early next week. She has had two doses of 5 mg which is much higher than her usual dose so I expect her INR to jump tomorrow. I would send her home on her previous dose since she is on amio. Would continue amio 400 bid for another week and then taper to 200. I would like to see her in a week with a CXR.

## 2017-07-13 NOTE — Progress Notes (Signed)
Telemetry reviewed with Dr. Curt Bears SB/SR high 50's-60's this AM If maintains SR, down-titrate amiodarone as able  EP service remains available, recall if needed.  Tommye Standard, PA-C

## 2017-07-13 NOTE — Progress Notes (Signed)
07/12/2017 1600 Pt ambulated 462ft on 2L with rolling walker.  O2 sats were 88-92%, HR 110's.   [name] Corners

## 2017-07-13 NOTE — Progress Notes (Signed)
SATURATION QUALIFICATIONS: (This note is used to comply with regulatory documentation for home oxygen)  Patient Saturations on Room Air at Rest = 90%  Patient Saturations on Room Air while Ambulating = 79%  Patient Saturations on 2 Liters of oxygen while Ambulating = 97%  Please briefly explain why patient needs home oxygen:  Pt needs oxygen while ambulating.  Sats will drop too low without the oxygen. [name]

## 2017-07-14 LAB — PROTIME-INR
INR: 1.5
Prothrombin Time: 18 seconds — ABNORMAL HIGH (ref 11.4–15.2)

## 2017-07-14 MED ORDER — AMIODARONE HCL 200 MG PO TABS: 400.0000 mg | ORAL_TABLET | Freq: Two times a day (BID) | ORAL | 1 refills | Status: DC

## 2017-07-14 MED ORDER — OXYCODONE HCL 5 MG PO TABS: ORAL_TABLET | ORAL | 0 refills | Status: DC

## 2017-07-14 MED ORDER — AMIODARONE HCL 200 MG PO TABS
400.0000 mg | ORAL_TABLET | Freq: Two times a day (BID) | ORAL | 1 refills | Status: DC
Start: 2017-07-14 — End: 2017-07-25

## 2017-07-14 NOTE — Progress Notes (Signed)
07/14/2017 1240 Discharge AVS meds taken today and those due this evening reviewed.  Follow-up appointments and when to call md reviewed.  D/C IV and TELE.  Questions and concerns addressed.   D/C home per orders. [name] Corners

## 2017-07-14 NOTE — Progress Notes (Signed)
      [address]       [place] [address]             [phone]       10 Days Post-Op Procedure(s) (LRB): THORACOTOMY MAJOR; EXPLORATION LEFT CHEST, LIGATION BLEEDING BRONCHIAL ARTERY, EVACUATION HEMATOMA (Left)  Subjective: Patient without specific complaints this am. She hopes to go home.  Objective: Vital signs in last 24 hours: Temp:  [97.9 F (36.6 C)-98.2 F (36.8 C)] 98.2 F (36.8 C) (09/01 0417) Pulse Rate:  [54-75] 55 (09/01 0417) Cardiac Rhythm: Normal sinus rhythm (08/31 1906) Resp:  [13-22] 18 (09/01 0417) BP: (119-140)/(54-67) 122/54 (09/01 0417) SpO2:  [97 %-100 %] 100 % (09/01 0744)  Intake/Output from previous day: 08/31 0701 - 09/01 0700 In: 360 [P.O.:360] Out: 1400 [Urine:1400]   Physical Exam:  Cardiovascular: Slightly bradycardic Pulmonary: Clear to auscultation bilaterally Abdomen: Soft, non tender, bowel sounds present. Extremities: Trace ower extremity edema. Wounds: Clean and dry.  No erythema or signs of infection.   Lab Results: CBC:No results for input(s): WBC, HGB, HCT, PLT in the last 72 hours. BMET:  Recent Labs  07/12/17 0403  NA 134*  K 4.2  CL 96*  CO2 31  GLUCOSE 124*  BUN 7  CREATININE 0.58  CALCIUM 8.2*    PT/INR:  Recent Labs  07/14/17 0439  LABPROT 18.0*  INR 1.50   ABG:  INR: Will add last result for INR, ABG once components are confirmed Will add last 4 CBG results once components are confirmed  Assessment/Plan:  1. CV - PAF. EP signed off yesterday. SR/SB (HR has been in the 50's) this am. On Amiodarone 400 mg bid, Imdur 30 mg daiy, Toprol XL 50 mg daily, and Coumadin. INR increased from 1.16 to 1.5. Per Dr. [name], will resume home dose of Coumadin and have HHRN check on Tuesday (as Monday is holiday) 2.  Pulmonary - On 2 liters of oxygen via [place]. Will need oxygen at discharge as desat's on room air. Encourage incentive spirometer. 3. Discharge  Shonya Sumida  MPA-C 07/14/2017,7:50 AM

## 2017-07-14 NOTE — Progress Notes (Signed)
Notified [facility] that patient has order for DC, [place] 3/1 [place] RN and oxygen, qualifying sat note in chart.

## 2017-07-16 DIAGNOSIS — C3432 Malignant neoplasm of lower lobe, left bronchus or lung: Secondary | ICD-10-CM | POA: Diagnosis not present

## 2017-07-16 DIAGNOSIS — I2510 Atherosclerotic heart disease of native coronary artery without angina pectoris: Secondary | ICD-10-CM | POA: Diagnosis not present

## 2017-07-16 DIAGNOSIS — I10 Essential (primary) hypertension: Secondary | ICD-10-CM | POA: Diagnosis not present

## 2017-07-16 DIAGNOSIS — Z853 Personal history of malignant neoplasm of breast: Secondary | ICD-10-CM | POA: Diagnosis not present

## 2017-07-16 DIAGNOSIS — I700 Atherosclerosis of aorta: Secondary | ICD-10-CM | POA: Diagnosis not present

## 2017-07-16 DIAGNOSIS — M3500 Sicca syndrome, unspecified: Secondary | ICD-10-CM | POA: Diagnosis not present

## 2017-07-16 DIAGNOSIS — I252 Old myocardial infarction: Secondary | ICD-10-CM | POA: Diagnosis not present

## 2017-07-16 DIAGNOSIS — I480 Paroxysmal atrial fibrillation: Secondary | ICD-10-CM | POA: Diagnosis not present

## 2017-07-16 DIAGNOSIS — J439 Emphysema, unspecified: Secondary | ICD-10-CM | POA: Diagnosis not present

## 2017-07-17 ENCOUNTER — Ambulatory Visit (INDEPENDENT_AMBULATORY_CARE_PROVIDER_SITE_OTHER): Payer: Medicare Other | Admitting: Cardiology

## 2017-07-17 ENCOUNTER — Telehealth: Payer: Self-pay | Admitting: Cardiovascular Disease

## 2017-07-17 DIAGNOSIS — Z5181 Encounter for therapeutic drug level monitoring: Secondary | ICD-10-CM | POA: Insufficient documentation

## 2017-07-17 DIAGNOSIS — I480 Paroxysmal atrial fibrillation: Secondary | ICD-10-CM

## 2017-07-17 DIAGNOSIS — C3432 Malignant neoplasm of lower lobe, left bronchus or lung: Secondary | ICD-10-CM | POA: Diagnosis not present

## 2017-07-17 DIAGNOSIS — Z853 Personal history of malignant neoplasm of breast: Secondary | ICD-10-CM | POA: Diagnosis not present

## 2017-07-17 DIAGNOSIS — I700 Atherosclerosis of aorta: Secondary | ICD-10-CM | POA: Diagnosis not present

## 2017-07-17 DIAGNOSIS — J439 Emphysema, unspecified: Secondary | ICD-10-CM | POA: Diagnosis not present

## 2017-07-17 DIAGNOSIS — I2510 Atherosclerotic heart disease of native coronary artery without angina pectoris: Secondary | ICD-10-CM | POA: Diagnosis not present

## 2017-07-17 LAB — POCT INR: INR: 2.9

## 2017-07-17 NOTE — Telephone Encounter (Signed)
Returned call to [employer].Left message on personal voice mail Dr.Kelly advised continue same medications.Keep appointment as planned 07/31/17 at 2:40 pm.Advised to call sooner if needed.

## 2017-07-17 NOTE — Telephone Encounter (Signed)
Patient have follow-up office visit next week after her hospitalization for lobectomy.

## 2017-07-17 NOTE — Telephone Encounter (Signed)
Returned call to Bridget Raspberry RN with [facility].She stated patient recently had a left lobectomy.She went to see patient last week pulse 58 to 60.Stated she saw patient this morning pulse 98 B/P 138/70.Stated her only complaint was constipation, no bm in 4 days.Stated she can usually tell when heart is out of rhythm.She took her metoprolol 50 mg about 1 hour before the visit this morning.She wanted Dr.Kelly to be aware.Message sent to [facility].

## 2017-07-17 NOTE — Telephone Encounter (Signed)
New message     Pt is having elevated heart rate 98 today , 60 yesterday pt is a AFIB pt , her BP is fine , no other symptoms

## 2017-07-18 ENCOUNTER — Other Ambulatory Visit: Payer: Self-pay

## 2017-07-18 DIAGNOSIS — G8918 Other acute postprocedural pain: Secondary | ICD-10-CM

## 2017-07-18 MED ORDER — OXYCODONE HCL 5 MG PO TABS: ORAL_TABLET | ORAL | 0 refills | Status: DC

## 2017-07-18 NOTE — Telephone Encounter (Signed)
RX refill for Oxycodone 5 mg 1 every 4-6 hours prn/pain was signed by Dr Prescott Gum. Patient's daughter will pick up at front desk.

## 2017-07-20 ENCOUNTER — Telehealth: Payer: Self-pay | Admitting: Thoracic Surgery (Cardiothoracic Vascular Surgery)

## 2017-07-20 ENCOUNTER — Ambulatory Visit (INDEPENDENT_AMBULATORY_CARE_PROVIDER_SITE_OTHER): Payer: Medicare Other | Admitting: Internal Medicine

## 2017-07-20 DIAGNOSIS — I480 Paroxysmal atrial fibrillation: Secondary | ICD-10-CM

## 2017-07-20 DIAGNOSIS — Z853 Personal history of malignant neoplasm of breast: Secondary | ICD-10-CM | POA: Diagnosis not present

## 2017-07-20 DIAGNOSIS — J439 Emphysema, unspecified: Secondary | ICD-10-CM | POA: Diagnosis not present

## 2017-07-20 DIAGNOSIS — Z5181 Encounter for therapeutic drug level monitoring: Secondary | ICD-10-CM | POA: Diagnosis not present

## 2017-07-20 DIAGNOSIS — C3432 Malignant neoplasm of lower lobe, left bronchus or lung: Secondary | ICD-10-CM | POA: Diagnosis not present

## 2017-07-20 DIAGNOSIS — I700 Atherosclerosis of aorta: Secondary | ICD-10-CM | POA: Diagnosis not present

## 2017-07-20 DIAGNOSIS — I2510 Atherosclerotic heart disease of native coronary artery without angina pectoris: Secondary | ICD-10-CM | POA: Diagnosis not present

## 2017-07-20 LAB — POCT INR: INR: 4.1

## 2017-07-20 NOTE — Telephone Encounter (Signed)
Called to complain of tongue swelling due to amiodarone.   It is only new medication started since admission.   No trouble swallowing. Daughter looked in throat and did not see any white patches  She will stop amiodarone. She is monitoring her HR  Remo Lipps C. Roxan Hockey, MD [facility] [phone]

## 2017-07-24 ENCOUNTER — Telehealth: Payer: Self-pay | Admitting: Cardiovascular Disease

## 2017-07-24 ENCOUNTER — Other Ambulatory Visit: Payer: Self-pay

## 2017-07-24 ENCOUNTER — Encounter: Payer: Self-pay | Admitting: Nurse Practitioner

## 2017-07-24 ENCOUNTER — Telehealth: Payer: Self-pay | Admitting: Internal Medicine

## 2017-07-24 DIAGNOSIS — C3432 Malignant neoplasm of lower lobe, left bronchus or lung: Secondary | ICD-10-CM | POA: Diagnosis not present

## 2017-07-24 DIAGNOSIS — I2510 Atherosclerotic heart disease of native coronary artery without angina pectoris: Secondary | ICD-10-CM | POA: Diagnosis not present

## 2017-07-24 DIAGNOSIS — I480 Paroxysmal atrial fibrillation: Secondary | ICD-10-CM | POA: Diagnosis not present

## 2017-07-24 DIAGNOSIS — I25118 Atherosclerotic heart disease of native coronary artery with other forms of angina pectoris: Secondary | ICD-10-CM

## 2017-07-24 DIAGNOSIS — Z853 Personal history of malignant neoplasm of breast: Secondary | ICD-10-CM | POA: Diagnosis not present

## 2017-07-24 DIAGNOSIS — I700 Atherosclerosis of aorta: Secondary | ICD-10-CM | POA: Diagnosis not present

## 2017-07-24 DIAGNOSIS — J439 Emphysema, unspecified: Secondary | ICD-10-CM | POA: Diagnosis not present

## 2017-07-24 NOTE — Telephone Encounter (Signed)
New Message     [facility]    Call daughter at [phone]   They are notifying office that pt vitals were elevated  bp 210/100 and pulse 48  resting

## 2017-07-24 NOTE — Telephone Encounter (Signed)
Dr. Claiborne Billings is the patient's primary cardiologist- patient also followed by Cardiothoracic surgery (Dr. [name]) for left lower lobectomy.   Reviewed with Dr. Caryl Comes since it is after 5 pm.  He called and spoke with the patient's daughter- instructed her to have the patient take an extra lisinopril 20 mg tonight and extra imdur 30 mg tonight.
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
## 2017-07-25 ENCOUNTER — Ambulatory Visit
Admission: RE | Admit: 2017-07-25 | Discharge: 2017-07-25 | Disposition: A | Discharge status: Home / Self Care | Payer: Medicare Other | Source: Ambulatory Visit | Attending: Surgery | Admitting: Surgery

## 2017-07-25 ENCOUNTER — Encounter: Payer: Self-pay | Admitting: Nurse Practitioner

## 2017-07-25 ENCOUNTER — Ambulatory Visit (INDEPENDENT_AMBULATORY_CARE_PROVIDER_SITE_OTHER): Payer: Self-pay | Admitting: Surgery

## 2017-07-25 ENCOUNTER — Ambulatory Visit (INDEPENDENT_AMBULATORY_CARE_PROVIDER_SITE_OTHER): Payer: Medicare Other | Admitting: *Deleted

## 2017-07-25 ENCOUNTER — Other Ambulatory Visit: Payer: Self-pay | Admitting: *Deleted

## 2017-07-25 ENCOUNTER — Ambulatory Visit (INDEPENDENT_AMBULATORY_CARE_PROVIDER_SITE_OTHER): Payer: Medicare Other | Admitting: Nurse Practitioner

## 2017-07-25 VITALS — BP 132/63 | HR 48 | Resp 20 | Ht 63.0 in | Wt 156.0 lb

## 2017-07-25 VITALS — BP 138/72 | HR 43 | Ht 63.0 in | Wt 154.6 lb

## 2017-07-25 DIAGNOSIS — J90 Pleural effusion, not elsewhere classified: Secondary | ICD-10-CM | POA: Diagnosis not present

## 2017-07-25 DIAGNOSIS — D381 Neoplasm of uncertain behavior of trachea, bronchus and lung: Secondary | ICD-10-CM

## 2017-07-25 DIAGNOSIS — I10 Essential (primary) hypertension: Secondary | ICD-10-CM | POA: Diagnosis not present

## 2017-07-25 DIAGNOSIS — I480 Paroxysmal atrial fibrillation: Secondary | ICD-10-CM

## 2017-07-25 DIAGNOSIS — Z902 Acquired absence of lung [part of]: Secondary | ICD-10-CM

## 2017-07-25 DIAGNOSIS — I25118 Atherosclerotic heart disease of native coronary artery with other forms of angina pectoris: Secondary | ICD-10-CM

## 2017-07-25 DIAGNOSIS — I2510 Atherosclerotic heart disease of native coronary artery without angina pectoris: Secondary | ICD-10-CM

## 2017-07-25 DIAGNOSIS — Z7901 Long term (current) use of anticoagulants: Secondary | ICD-10-CM | POA: Diagnosis not present

## 2017-07-25 DIAGNOSIS — I4891 Unspecified atrial fibrillation: Secondary | ICD-10-CM

## 2017-07-25 DIAGNOSIS — Z5181 Encounter for therapeutic drug level monitoring: Secondary | ICD-10-CM

## 2017-07-25 DIAGNOSIS — I4892 Unspecified atrial flutter: Secondary | ICD-10-CM | POA: Diagnosis not present

## 2017-07-25 DIAGNOSIS — G8918 Other acute postprocedural pain: Secondary | ICD-10-CM

## 2017-07-25 LAB — POCT INR: INR: 3.4

## 2017-07-25 MED ORDER — OXYCODONE HCL 5 MG PO TABS: ORAL_TABLET | ORAL | 0 refills | Status: DC

## 2017-07-25 MED ORDER — ISOSORBIDE MONONITRATE ER 30 MG PO TB24: 30.0000 mg | ORAL_TABLET | Freq: Two times a day (BID) | ORAL | 4 refills | Status: DC

## 2017-07-25 MED ORDER — LISINOPRIL 20 MG PO TABS: 20.0000 mg | ORAL_TABLET | Freq: Two times a day (BID) | ORAL | 4 refills | Status: DC

## 2017-07-25 NOTE — Patient Instructions (Addendum)
Medication Instructions:  Your physician recommends that you continue on your current medications as directed. Please refer to the Current Medication list given to you today.   Labwork: None ordered  Testing/Procedures: None ordered  Follow-Up: Your physician recommends that you schedule a follow-up appointment in: Tat Momoli, NP   Any Other Special Instructions Will Be Listed Below (If Applicable). If your heart rate falls lower than 50, then cut your Toprol in half    If you need a refill on your cardiac medications before your next appointment, please call your pharmacy.

## 2017-07-26 ENCOUNTER — Other Ambulatory Visit: Payer: Self-pay | Admitting: Cardiovascular Disease

## 2017-07-28 ENCOUNTER — Encounter: Payer: Self-pay | Admitting: Surgery

## 2017-07-28 NOTE — Progress Notes (Signed)
HPI: Patient returns for routine postoperative follow-up having undergone left muscle-sparing thoracotomy and left lower lobectomy on 07/02/2017. The patient's early postoperative recovery while in the hospital was notable for bleeding into the left chest on POD 2 requiring exploration and evacuation of hemothorax with ligation of a bleeding bronchial artery on the left lower lobe bronchial stump. She had a slow postop course due to atrial fibrillation that was difficult to control and oxygen dependence. She was discharge on home oxygen. Since hospital discharge the patient reports that she has been feeling much better. She is still using the oxygen most of the time.  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   Current Outpatient Prescriptions  Medication Sig Dispense Refill  . acetaminophen (TYLENOL) 500 MG tablet Take 500 mg by mouth every 8 (eight) hours as needed for mild pain or moderate pain.    [name] aspirin EC 81 MG tablet Take 81 mg by mouth at bedtime.    . Calcium Carb-Cholecalciferol (CALTRATE 600+D) 600-800 MG-UNIT TABS Take 1 tablet by mouth daily.     . Cholecalciferol (VITAMIN D-3) 5000 UNITS TABS Take 5,000 Units by mouth daily.     . hydrocortisone valerate cream (WESTCORT) 0.2 % Apply 1 application topically 3 (three) times a week. On random days - for eczema in ear    . isosorbide mononitrate (IMDUR) 30 MG 24 hr tablet Take 1 tablet (30 mg total) by mouth 2 (two) times daily. 60 tablet 4  . lisinopril (PRINIVIL,ZESTRIL) 20 MG tablet Take 1 tablet (20 mg total) by mouth 2 (two) times daily. 60 tablet 4  . metoprolol succinate (TOPROL-XL) 50 MG 24 hr tablet Take 1 tablet (50 mg total) by mouth daily. 30 tablet 3  . nitroGLYCERIN (NITROSTAT) 0.4 MG SL tablet Place 1 tablet (0.4 mg total) under the tongue every 5 (five) minutes x 3 doses as needed for chest pain. 25 tablet 3  . omega-3 acid ethyl  esters (LOVAZA) 1 g capsule TAKE 1 CAPSULE BY MOUTH EVERY DAY AT NOON 30 capsule 6  . oxyCODONE (OXY IR/ROXICODONE) 5 MG immediate release tablet Take 5 mg by mouth every 4-6 hours PRN severe pain. 30 tablet 0  . Polyethyl Glycol-Propyl Glycol (SYSTANE PRESERVATIVE FREE OP) Place 1 drop into both eyes 2 (two) times daily.     [name] pyridOXINE (VITAMIN B-6) 100 MG tablet Take 100 mg by mouth daily.    [name] umeclidinium bromide (INCRUSE ELLIPTA) 62.5 MCG/INH AEPB Inhale 1 puff into the lungs daily. 30 each 5  . valACYclovir (VALTREX) 500 MG tablet Take 500 mg by mouth daily as needed. For cold sores    . warfarin (COUMADIN) 2.5 MG tablet Take 1.25-2.5 mg by mouth daily. 1 tablet daily except 1/2 tablet each Sunday Tuesday and Thursday    . ezetimibe (ZETIA) 10 MG tablet TAKE 1 TABLET(10 MG) BY MOUTH EVERY EVENING 30 tablet 3  . metoprolol tartrate (LOPRESSOR) 25 MG tablet TAKE 1 TABLET BY MOUTH EVERY DAY AS NEEDED 30 tablet 3  . simvastatin (ZOCOR) 20 MG tablet TAKE 1 TABLET(20 MG) BY MOUTH DAILY 30 tablet 3   No current facility-administered medications for this visit.     Physical Exam: BP 132/63   Pulse (!) 48   Resp 20   Ht 5\' 3"  (1.6 m)   Wt 156 lb (70.8 kg)   SpO2 95% Comment: 2L O2 per [place]  BMI 27.63 kg/m  She looks well Lungs are clear Cardiac exam shows an irregular rate and rhythm The left chest incision is healing well.  Diagnostic Tests:  CLINICAL DATA:  Status post left lower lobectomy on July 02, 2017 with reoperation 2 days later for a bleeding bronchial artery and resultant hematoma.  EXAM: CHEST  2 VIEW  COMPARISON:  Chest x-ray of July 09, 2017  FINDINGS: The right lung is well-expanded. There is a tiny residual right pleural effusion. On the left the small pleural effusion has increased slightly in conspicuity. The interstitial markings in the perihilar region remain coarse in the retrocardiac region is dense. The heart is top-normal in size. The pulmonary  vascularity is not engorged. There is calcification in the wall of the aortic arch. The sternal wires are intact. The bony thorax exhibits no acute abnormality.  IMPRESSION: Increasing density in the left lower hemithorax which may reflect atelectasis or pneumonia in in the remaining portions of the left lower lobe. Slight interval increase in the volume of the left pleural effusion. Decreased right pleural effusion.  Thoracic aortic atherosclerosis.   Electronically Signed   By: David  Martinique M.D.   On: 07/25/2017 14:28   Impression:  She is doing well clinically. I have personally reviewed her CXR and I think it looks better than her last one with improved aeration in the right base. There is mild density at the left base that I think is residual atelectasis. I encouraged her to try going without oxygen when she is not ambulating and see how her sats are and how she feels. I would expect her oxygenation to improve with time.  Plan:  I will see her back in three weeks with a CXR for follow up.   Gaye Pollack, MD [facility] [phone]

## 2017-07-30 ENCOUNTER — Ambulatory Visit (INDEPENDENT_AMBULATORY_CARE_PROVIDER_SITE_OTHER): Payer: Medicare Other

## 2017-07-30 DIAGNOSIS — I700 Atherosclerosis of aorta: Secondary | ICD-10-CM | POA: Diagnosis not present

## 2017-07-30 DIAGNOSIS — Z853 Personal history of malignant neoplasm of breast: Secondary | ICD-10-CM | POA: Diagnosis not present

## 2017-07-30 DIAGNOSIS — Z5181 Encounter for therapeutic drug level monitoring: Secondary | ICD-10-CM

## 2017-07-30 DIAGNOSIS — I480 Paroxysmal atrial fibrillation: Secondary | ICD-10-CM

## 2017-07-30 DIAGNOSIS — I2510 Atherosclerotic heart disease of native coronary artery without angina pectoris: Secondary | ICD-10-CM | POA: Diagnosis not present

## 2017-07-30 DIAGNOSIS — C3432 Malignant neoplasm of lower lobe, left bronchus or lung: Secondary | ICD-10-CM | POA: Diagnosis not present

## 2017-07-30 DIAGNOSIS — J439 Emphysema, unspecified: Secondary | ICD-10-CM | POA: Diagnosis not present

## 2017-07-30 LAB — POCT INR: INR: 2.2

## 2017-07-31 ENCOUNTER — Ambulatory Visit (INDEPENDENT_AMBULATORY_CARE_PROVIDER_SITE_OTHER): Payer: Medicare Other | Admitting: Cardiovascular Disease

## 2017-07-31 ENCOUNTER — Encounter: Payer: Self-pay | Admitting: Cardiovascular Disease

## 2017-07-31 VITALS — BP 134/62 | HR 45 | Ht 63.0 in | Wt 149.0 lb

## 2017-07-31 DIAGNOSIS — Z79899 Other long term (current) drug therapy: Secondary | ICD-10-CM

## 2017-07-31 DIAGNOSIS — I739 Peripheral vascular disease, unspecified: Secondary | ICD-10-CM

## 2017-07-31 DIAGNOSIS — R001 Bradycardia, unspecified: Secondary | ICD-10-CM

## 2017-07-31 DIAGNOSIS — I480 Paroxysmal atrial fibrillation: Secondary | ICD-10-CM | POA: Diagnosis not present

## 2017-07-31 DIAGNOSIS — I2510 Atherosclerotic heart disease of native coronary artery without angina pectoris: Secondary | ICD-10-CM | POA: Diagnosis not present

## 2017-07-31 DIAGNOSIS — E785 Hyperlipidemia, unspecified: Secondary | ICD-10-CM

## 2017-07-31 DIAGNOSIS — C3432 Malignant neoplasm of lower lobe, left bronchus or lung: Secondary | ICD-10-CM | POA: Diagnosis not present

## 2017-07-31 DIAGNOSIS — I10 Essential (primary) hypertension: Secondary | ICD-10-CM

## 2017-07-31 DIAGNOSIS — I2583 Coronary atherosclerosis due to lipid rich plaque: Secondary | ICD-10-CM | POA: Diagnosis not present

## 2017-07-31 NOTE — Telephone Encounter (Signed)
Spoke with pt and she states that the home health nurse didn't call yesterday and give her dose instructions. Thus, she took a whole tablet because was unsure. Therefore, instructed her to take 1/2 tablet daily until her follow up. Amio was discontinued 07/20/17. She want to go to [employer].

## 2017-07-31 NOTE — Telephone Encounter (Signed)
Patient was currently seeing Dr. Claiborne Billings. In speaking with her, she needed clarification on how much warfarin she should be taking. She had not heard from her [place]. According to the anticoagulant notes from yesterday:  Spoke w/ Richardine Service, RN w/ [facility] [phone]).  Advised her to have pt continue taking 1/2 tablet daily.   Recheck in INR in 2 weeks.    The patient stated that she took a whole tablet yesterday but will start a half a tablet today. The patient stated that she would like to have her INR checked again next week instead of two weeks. She would like to go to the [address] office to have this done. Will route this message to them for their information.

## 2017-07-31 NOTE — Patient Instructions (Signed)
Medication Instructions:  Your physician recommends that you continue on your current medications as directed. Please refer to the Current Medication list given to you today.  Testing/Procedures: Your physician has requested that you have a lower extremity arterial duplex. This test is an ultrasound of the arteries in the legs or arms. It looks at arterial blood flow in the legs and arms. Allow one hour for Lower and Upper Arterial scans. There are no restrictions or special instructions  Follow-Up: Your physician wants you to follow-up in: 4 MONTHS with Dr. Claiborne Billings. You will receive a reminder letter in the mail two months in advance. If you don't receive a letter, please call our office to schedule the follow-up appointment.   Any Other Special Instructions Will Be Listed Below (If Applicable).     If you need a refill on your cardiac medications before your next appointment, please call your pharmacy.

## 2017-07-31 NOTE — Telephone Encounter (Signed)
New message   Pt wants to know if she needs to make any adjustments to her Warfarin per the nurse. She requests a call back
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
## 2017-08-01 NOTE — Addendum Note (Signed)
Addended by: Fanny Bien R on: 08/01/2017 11:53 AM   Modules accepted: Orders

## 2017-08-02 ENCOUNTER — Telehealth: Payer: Self-pay | Admitting: *Deleted

## 2017-08-02 ENCOUNTER — Telehealth: Payer: Self-pay | Admitting: Internal Medicine

## 2017-08-02 NOTE — Telephone Encounter (Signed)
New message    Pt c/o medication issue:  1. Name of Medication: isosorbide mononitrate (IMDUR) 30 MG 24 hr tablet  2. How are you currently taking this medication (dosage and times per day)? 30mg   3. Are you having a reaction (difficulty breathing--STAT)? bp now is really low  4. What is your medication issue? Pt accidentally took 2 of these and wants to know if theres anything she can do to counteract it. Requests a call back

## 2017-08-02 NOTE — Addendum Note (Signed)
Addended by: Fanny Bien R on: 08/02/2017 10:34 AM   Modules accepted: Orders

## 2017-08-02 NOTE — Telephone Encounter (Signed)
[facility] orders for INR check/coumadin orders signed by Dr. Claiborne Billings and faxed back to [facility]

## 2017-08-02 NOTE — Telephone Encounter (Signed)
Spoke with pt, she accidentally took 2 isosorbide this morning, she currently feels fine but her bp is a little low. Encourage patient to drink a little extra today just to keep bp up but not to get into trouble with her heart failure. She is also supposed to take another isosorbide this evening, okay given to patient to skip the dose tonight if her bp is still low. Orthostatic precautions discussed with the patient. She will call with any problems.

## 2017-08-03 ENCOUNTER — Telehealth: Payer: Self-pay | Admitting: Cardiovascular Disease

## 2017-08-03 NOTE — Telephone Encounter (Signed)
New message     *STAT* If patient is at the pharmacy, call can be transferred to refill team.   1. Which medications need to be refilled? (please list name of each medication and dose if known) nitroglycerin   2. Which pharmacy/location (including street and city if local pharmacy) is medication to be sent to? [facility] on [address] in [place]  3. Do they need a 30 day or 90 day supply? [place]

## 2017-08-06 ENCOUNTER — Ambulatory Visit: Payer: Medicare Other | Attending: Surgery | Admitting: Physical Therapy

## 2017-08-06 ENCOUNTER — Other Ambulatory Visit: Payer: Self-pay | Admitting: Cardiovascular Disease

## 2017-08-06 DIAGNOSIS — I739 Peripheral vascular disease, unspecified: Secondary | ICD-10-CM

## 2017-08-06 DIAGNOSIS — R293 Abnormal posture: Secondary | ICD-10-CM | POA: Diagnosis not present

## 2017-08-06 DIAGNOSIS — M546 Pain in thoracic spine: Secondary | ICD-10-CM

## 2017-08-06 DIAGNOSIS — Z7409 Other reduced mobility: Secondary | ICD-10-CM | POA: Diagnosis not present

## 2017-08-06 MED ORDER — NITROGLYCERIN 0.4 MG SL SUBL: 0.4000 mg | SUBLINGUAL_TABLET | SUBLINGUAL | 1 refills | Status: DC | PRN

## 2017-08-06 NOTE — Patient Instructions (Addendum)
Stretches 3 x day   5 reps each:  seated:  Chest rises as palm slides back      Clapping "accordian" opening      Rainbow bend Right  and head tilt and look up from 6 clock to 10 o clock  Opposite side      ____________  Glenard Haring wings on your back  10   "smell soup" to not over strain the neck/shoudlers )     _____________  Walking : with gentle smelling to minimize shortness of breath  When trying to catch breath: lengthen inhale 1-2 pause, exhale 201 pause (through nose, /like smelling soup)     _____________ Sleeping with a pillow between knees

## 2017-08-06 NOTE — Telephone Encounter (Signed)
Refill authorization submitted electronically to patient's preferred pharmacy.

## 2017-08-07 DIAGNOSIS — I2510 Atherosclerotic heart disease of native coronary artery without angina pectoris: Secondary | ICD-10-CM | POA: Diagnosis not present

## 2017-08-07 DIAGNOSIS — C3432 Malignant neoplasm of lower lobe, left bronchus or lung: Secondary | ICD-10-CM | POA: Diagnosis not present

## 2017-08-07 DIAGNOSIS — J439 Emphysema, unspecified: Secondary | ICD-10-CM | POA: Diagnosis not present

## 2017-08-07 DIAGNOSIS — I700 Atherosclerosis of aorta: Secondary | ICD-10-CM | POA: Diagnosis not present

## 2017-08-07 DIAGNOSIS — I480 Paroxysmal atrial fibrillation: Secondary | ICD-10-CM | POA: Diagnosis not present

## 2017-08-07 DIAGNOSIS — Z853 Personal history of malignant neoplasm of breast: Secondary | ICD-10-CM | POA: Diagnosis not present
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
## 2017-08-08 ENCOUNTER — Ambulatory Visit (INDEPENDENT_AMBULATORY_CARE_PROVIDER_SITE_OTHER): Payer: Medicare Other

## 2017-08-08 DIAGNOSIS — I480 Paroxysmal atrial fibrillation: Secondary | ICD-10-CM

## 2017-08-08 DIAGNOSIS — Z5181 Encounter for therapeutic drug level monitoring: Secondary | ICD-10-CM | POA: Diagnosis not present

## 2017-08-08 LAB — POCT INR: INR: 2.1

## 2017-08-10 ENCOUNTER — Telehealth: Payer: Self-pay | Admitting: Cardiovascular Disease

## 2017-08-10 NOTE — Telephone Encounter (Signed)
Returned call to patient. She states we should have received paperwork from [facility] for her nitroglycerin. States this is a Tier 3 medication on her plan. I clarified that the request was for a tier exception, not a prior authorization. Explained the difference between these two to the patient. Explained that the tier exceptions are not often approved.  Asked patient how much her copay was for the nitroglycerin. States $19 but that "it's the principle of the thing". Informed patient I would relay request to Dr. Evette Georges nurse.

## 2017-08-10 NOTE — Telephone Encounter (Signed)
New message     Patient wants to know if appeal letter/form for [employer] has been completed. Appeal must be done  within 14 day window. The appeal was for the drug nitroGLYCERIN (NITROSTAT) 0.4 MG SL tablet. Use reference # [id number]  [phone] Customer Service

## 2017-08-13 ENCOUNTER — Ambulatory Visit (HOSPITAL_COMMUNITY)
Admission: RE | Admit: 2017-08-13 | Discharge: 2017-08-13 | Disposition: A | Discharge status: Home / Self Care | Payer: Medicare Other | Source: Ambulatory Visit | Attending: Cardiology | Admitting: Cardiology

## 2017-08-13 ENCOUNTER — Ambulatory Visit: Payer: Medicare Other | Attending: Surgery | Admitting: Physical Therapy

## 2017-08-13 DIAGNOSIS — I10 Essential (primary) hypertension: Secondary | ICD-10-CM | POA: Diagnosis not present

## 2017-08-13 DIAGNOSIS — R293 Abnormal posture: Secondary | ICD-10-CM | POA: Insufficient documentation

## 2017-08-13 DIAGNOSIS — I708 Atherosclerosis of other arteries: Secondary | ICD-10-CM | POA: Insufficient documentation

## 2017-08-13 DIAGNOSIS — I2510 Atherosclerotic heart disease of native coronary artery without angina pectoris: Secondary | ICD-10-CM | POA: Diagnosis not present

## 2017-08-13 DIAGNOSIS — Z951 Presence of aortocoronary bypass graft: Secondary | ICD-10-CM | POA: Insufficient documentation

## 2017-08-13 DIAGNOSIS — Z7409 Other reduced mobility: Secondary | ICD-10-CM | POA: Diagnosis not present

## 2017-08-13 DIAGNOSIS — M546 Pain in thoracic spine: Secondary | ICD-10-CM

## 2017-08-13 DIAGNOSIS — I480 Paroxysmal atrial fibrillation: Secondary | ICD-10-CM | POA: Diagnosis not present

## 2017-08-13 DIAGNOSIS — R9389 Abnormal findings on diagnostic imaging of other specified body structures: Secondary | ICD-10-CM | POA: Diagnosis not present

## 2017-08-13 DIAGNOSIS — I700 Atherosclerosis of aorta: Secondary | ICD-10-CM | POA: Insufficient documentation

## 2017-08-13 DIAGNOSIS — Z79899 Other long term (current) drug therapy: Secondary | ICD-10-CM | POA: Diagnosis not present

## 2017-08-13 DIAGNOSIS — Z87891 Personal history of nicotine dependence: Secondary | ICD-10-CM | POA: Diagnosis not present

## 2017-08-13 DIAGNOSIS — E785 Hyperlipidemia, unspecified: Secondary | ICD-10-CM | POA: Insufficient documentation

## 2017-08-13 DIAGNOSIS — I739 Peripheral vascular disease, unspecified: Secondary | ICD-10-CM | POA: Diagnosis not present

## 2017-08-13 DIAGNOSIS — J449 Chronic obstructive pulmonary disease, unspecified: Secondary | ICD-10-CM | POA: Diagnosis not present

## 2017-08-13 NOTE — Patient Instructions (Signed)
Deep core level 2  6 min x 2 day    Open book 15 reps x 2-3 x rep and angle wings ( palms up and drwa by side before shoudlers round back up)   15 reps  X 2-3 day    Provided sleep clinic brochure
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
## 2017-08-14 ENCOUNTER — Other Ambulatory Visit: Payer: Self-pay | Admitting: Surgery

## 2017-08-14 DIAGNOSIS — I700 Atherosclerosis of aorta: Secondary | ICD-10-CM | POA: Diagnosis not present

## 2017-08-14 DIAGNOSIS — I2510 Atherosclerotic heart disease of native coronary artery without angina pectoris: Secondary | ICD-10-CM | POA: Diagnosis not present

## 2017-08-14 DIAGNOSIS — Z853 Personal history of malignant neoplasm of breast: Secondary | ICD-10-CM | POA: Diagnosis not present

## 2017-08-14 DIAGNOSIS — Z902 Acquired absence of lung [part of]: Secondary | ICD-10-CM

## 2017-08-14 DIAGNOSIS — J439 Emphysema, unspecified: Secondary | ICD-10-CM | POA: Diagnosis not present

## 2017-08-14 DIAGNOSIS — I480 Paroxysmal atrial fibrillation: Secondary | ICD-10-CM | POA: Diagnosis not present

## 2017-08-14 DIAGNOSIS — C3432 Malignant neoplasm of lower lobe, left bronchus or lung: Secondary | ICD-10-CM | POA: Diagnosis not present

## 2017-08-14 LAB — COMPREHENSIVE METABOLIC PANEL
ALT: 16 IU/L (ref 0–32)
AST: 20 IU/L (ref 0–40)
Albumin/Globulin Ratio: 2 (ref 1.2–2.2)
Albumin: 4.2 g/dL (ref 3.5–4.8)
Alkaline Phosphatase: 52 IU/L (ref 39–117)
BUN/Creatinine Ratio: 14 (ref 12–28)
BUN: 11 mg/dL (ref 8–27)
Bilirubin Total: 0.3 mg/dL (ref 0.0–1.2)
CO2: 27 mmol/L (ref 20–29)
Calcium: 9 mg/dL (ref 8.7–10.3)
Chloride: 98 mmol/L (ref 96–106)
Creatinine, Ser: 0.81 mg/dL (ref 0.57–1.00)
GFR calc Af Amer: 83 mL/min/{1.73_m2} (ref >59)
GFR calc non Af Amer: 72 mL/min/{1.73_m2} (ref >59)
Globulin, Total: 2.1 g/dL (ref 1.5–4.5)
Glucose: 93 mg/dL (ref 65–99)
Potassium: 4.3 mmol/L (ref 3.5–5.2)
Sodium: 139 mmol/L (ref 134–144)
Total Protein: 6.3 g/dL (ref 6.0–8.5)

## 2017-08-14 LAB — LIPID PANEL
Chol/HDL Ratio: 2.6 ratio (ref 0.0–4.4)
Cholesterol, Total: 148 mg/dL (ref 100–199)
HDL: 58 mg/dL (ref >39)
LDL Calculated: 74 mg/dL (ref 0–99)
Triglycerides: 82 mg/dL (ref 0–149)
VLDL Cholesterol Cal: 16 mg/dL (ref 5–40)

## 2017-08-15 ENCOUNTER — Encounter: Payer: Self-pay | Admitting: Surgery

## 2017-08-15 ENCOUNTER — Ambulatory Visit (INDEPENDENT_AMBULATORY_CARE_PROVIDER_SITE_OTHER): Payer: Medicare Other | Admitting: Pharmacist

## 2017-08-15 ENCOUNTER — Ambulatory Visit
Admission: RE | Admit: 2017-08-15 | Discharge: 2017-08-15 | Disposition: A | Discharge status: Home / Self Care | Payer: Medicare Other | Source: Ambulatory Visit | Attending: Surgery | Admitting: Surgery

## 2017-08-15 ENCOUNTER — Ambulatory Visit (INDEPENDENT_AMBULATORY_CARE_PROVIDER_SITE_OTHER): Payer: Self-pay | Admitting: Surgery

## 2017-08-15 VITALS — BP 143/65 | HR 45 | Ht 63.0 in | Wt 149.0 lb

## 2017-08-15 DIAGNOSIS — I480 Paroxysmal atrial fibrillation: Secondary | ICD-10-CM

## 2017-08-15 DIAGNOSIS — Z09 Encounter for follow-up examination after completed treatment for conditions other than malignant neoplasm: Secondary | ICD-10-CM

## 2017-08-15 DIAGNOSIS — Z5181 Encounter for therapeutic drug level monitoring: Secondary | ICD-10-CM | POA: Diagnosis not present

## 2017-08-15 DIAGNOSIS — R0602 Shortness of breath: Secondary | ICD-10-CM | POA: Diagnosis not present

## 2017-08-15 DIAGNOSIS — Z7901 Long term (current) use of anticoagulants: Secondary | ICD-10-CM | POA: Diagnosis not present

## 2017-08-15 DIAGNOSIS — Z902 Acquired absence of lung [part of]: Secondary | ICD-10-CM

## 2017-08-15 LAB — POCT INR: INR: 1.5

## 2017-08-15 NOTE — Telephone Encounter (Signed)
Spoke to patient, made aware that unable to do tier exception as there are no prior medications used and failed prior to prescribing NTG SL.   Patient verbalized understanding and appreciated the call.

## 2017-08-16 ENCOUNTER — Encounter: Payer: Self-pay | Admitting: Surgery

## 2017-08-16 NOTE — Progress Notes (Signed)
HPI:  Patient returns for routine postoperative follow-up having undergone left muscle-sparing thoracotomy and left lower lobectomy on 07/02/2017. The patient's early postoperative recovery while in the hospital was notable for bleeding into the left chest on POD 2 requiring exploration and evacuation of hemothorax with ligation of a bleeding bronchial artery on the left lower lobe bronchial stump. She had a slow postop course due to atrial fibrillation that was difficult to control and oxygen dependence. She was discharge on home oxygen. Since I last saw her on 07/25/2017 she has continued to improve. She is not using oxygen during the day and ambulating with mild shortness of breath if she goes too far. She is still using oxygen at 2L [place] at night. She has been maintaining sinus rhythm and amiodarone was stopped due to bradycardia.   Current Outpatient Prescriptions  Medication Sig Dispense Refill  . acetaminophen (TYLENOL) 500 MG tablet Take 500 mg by mouth every 8 (eight) hours as needed for mild pain or moderate pain.    [name] aspirin EC 81 MG tablet Take 81 mg by mouth at bedtime.    . Calcium Carb-Cholecalciferol (CALTRATE 600+D) 600-800 MG-UNIT TABS Take 1 tablet by mouth daily.     . Cholecalciferol (VITAMIN D-3) 5000 UNITS TABS Take 5,000 Units by mouth daily.     [name] ezetimibe (ZETIA) 10 MG tablet TAKE 1 TABLET(10 MG) BY MOUTH EVERY EVENING 30 tablet 3  . hydrocortisone valerate cream (WESTCORT) 0.2 % Apply 1 application topically 3 (three) times a week. On random days - for eczema in ear    . isosorbide mononitrate (IMDUR) 30 MG 24 hr tablet Take 1 tablet (30 mg total) by mouth 2 (two) times daily. 60 tablet 4  . lisinopril (PRINIVIL,ZESTRIL) 20 MG tablet Take 1 tablet (20 mg total) by mouth 2 (two) times daily. 60 tablet 4  . metoprolol succinate (TOPROL-XL) 50 MG 24 hr tablet Take 1 tablet (50 mg total) by mouth daily. 30 tablet 3  . metoprolol tartrate (LOPRESSOR) 25 MG tablet TAKE 1  TABLET BY MOUTH EVERY DAY AS NEEDED 30 tablet 3  . nitroGLYCERIN (NITROSTAT) 0.4 MG SL tablet Place 1 tablet (0.4 mg total) under the tongue every 5 (five) minutes x 3 doses as needed for chest pain. 25 tablet 1  . omega-3 acid ethyl esters (LOVAZA) 1 g capsule TAKE 1 CAPSULE BY MOUTH EVERY DAY AT NOON 30 capsule 6  . oxyCODONE (OXY IR/ROXICODONE) 5 MG immediate release tablet Take 5 mg by mouth every 4-6 hours PRN severe pain. 30 tablet 0  . Polyethyl Glycol-Propyl Glycol (SYSTANE PRESERVATIVE FREE OP) Place 1 drop into both eyes 2 (two) times daily.     [name] pyridOXINE (VITAMIN B-6) 100 MG tablet Take 100 mg by mouth daily.    . simvastatin (ZOCOR) 20 MG tablet TAKE 1 TABLET(20 MG) BY MOUTH DAILY 30 tablet 3  . umeclidinium bromide (INCRUSE ELLIPTA) 62.5 MCG/INH AEPB Inhale 1 puff into the lungs daily. 30 each 5  . valACYclovir (VALTREX) 500 MG tablet Take 500 mg by mouth daily as needed. For cold sores    . warfarin (COUMADIN) 2.5 MG tablet Take 1.25-2.5 mg by mouth daily. 1 tablet daily except 1/2 tablet each Sunday Tuesday and Thursday     No current facility-administered medications for this visit.      Physical Exam: BP (!) 143/65   Pulse (!) 45   Ht 5\' 3"  (1.6 m)   Wt 149 lb (67.6 kg)  SpO2 95%   BMI 26.39 kg/m  She looks well Lungs are clear Cardiac exam shows a regular rate and rhythm with normal heart sounds. The left chest incision is healing well.   Diagnostic Tests:  CLINICAL DATA:  Status post LEFT lower lobectomy, shortness of breath.  EXAM: CHEST  2 VIEW  COMPARISON:  07/25/2017.  FINDINGS: Unchanged cardiomediastinal silhouette. Calcified thoracic aorta. Prior median sternotomy.  RIGHT lung clear. Continued retrocardiac density, increasingly prominent LEFT posterior effusion. Superimposed atelectasis or consolidation not excluded.  IMPRESSION: Continued retrocardiac density appears to be related in part to increasingly prominent posterior  effusion. Superimposed LEFT lung atelectasis or consolidation not excluded.   Electronically Signed   By: Staci Righter M.D.   On: 08/15/2017 12:28   Impression:  Overall I think she is continuing to make good progress considering her age, preop lung function and cardiac history with postop atrial fibrillation that was difficult to control. She will continue to try to wean off the oxygen at night. I encouraged her to continue ambulation to build up stamina.   Plan:  I will plan to see her back in 6 months with a CT of the chest for surveillance of her T1b, N0, M0 squamous cell carcinoma with lymphovascular invasion but negative resection margins.   Gaye Pollack, MD [facility] [phone]

## 2017-08-17 DIAGNOSIS — J069 Acute upper respiratory infection, unspecified: Secondary | ICD-10-CM | POA: Diagnosis not present

## 2017-08-22 ENCOUNTER — Telehealth: Payer: Self-pay | Admitting: Acute Care

## 2017-08-22 NOTE — Telephone Encounter (Signed)
Requesting cb from Judson Roch.

## 2017-08-23 ENCOUNTER — Ambulatory Visit (INDEPENDENT_AMBULATORY_CARE_PROVIDER_SITE_OTHER): Payer: Medicare Other | Admitting: Pharmacist Clinician (PhC)/ Clinical Pharmacy Specialist

## 2017-08-23 DIAGNOSIS — I480 Paroxysmal atrial fibrillation: Secondary | ICD-10-CM | POA: Diagnosis not present

## 2017-08-23 DIAGNOSIS — Z5181 Encounter for therapeutic drug level monitoring: Secondary | ICD-10-CM

## 2017-08-23 LAB — POCT INR: INR: 1.8

## 2017-08-23 NOTE — Telephone Encounter (Signed)
Appt made with BQ. Nothing further is needed.

## 2017-08-23 NOTE — Telephone Encounter (Signed)
I have called patient and discussed this with her. She needs an appointment with either me or Dr. Lake Bells for COPD follow up.

## 2017-08-24 ENCOUNTER — Ambulatory Visit: Payer: Medicare Other | Admitting: Physical Therapy

## 2017-08-24 DIAGNOSIS — Z902 Acquired absence of lung [part of]: Secondary | ICD-10-CM | POA: Diagnosis not present

## 2017-08-24 DIAGNOSIS — J069 Acute upper respiratory infection, unspecified: Secondary | ICD-10-CM | POA: Diagnosis not present

## 2017-08-24 DIAGNOSIS — R05 Cough: Secondary | ICD-10-CM | POA: Diagnosis not present

## 2017-08-29 ENCOUNTER — Other Ambulatory Visit: Payer: Self-pay | Admitting: Cardiovascular Disease

## 2017-08-30 ENCOUNTER — Telehealth: Payer: Self-pay | Admitting: Cardiovascular Disease

## 2017-08-30 DIAGNOSIS — Z23 Encounter for immunization: Secondary | ICD-10-CM | POA: Diagnosis not present

## 2017-08-30 NOTE — Telephone Encounter (Signed)
New message    Pt is calling asking for a call back about labs.

## 2017-08-30 NOTE — Telephone Encounter (Signed)
Patient calling regarding labs and doppler results Advised patient they are waiting for Dr Claiborne Billings to review and nurse will call after reviewed  Will forward to Dr Claiborne Billings and Marthann Schiller RN

## 2017-08-30 NOTE — Telephone Encounter (Signed)
REFILL 

## 2017-08-31 ENCOUNTER — Ambulatory Visit (INDEPENDENT_AMBULATORY_CARE_PROVIDER_SITE_OTHER): Payer: Medicare Other | Admitting: Pharmacist Clinician (PhC)/ Clinical Pharmacy Specialist

## 2017-08-31 DIAGNOSIS — I480 Paroxysmal atrial fibrillation: Secondary | ICD-10-CM | POA: Diagnosis not present

## 2017-08-31 DIAGNOSIS — I483 Typical atrial flutter: Secondary | ICD-10-CM

## 2017-08-31 DIAGNOSIS — Z5181 Encounter for therapeutic drug level monitoring: Secondary | ICD-10-CM

## 2017-08-31 LAB — POCT INR: INR: 2.1

## 2017-09-04 ENCOUNTER — Ambulatory Visit (INDEPENDENT_AMBULATORY_CARE_PROVIDER_SITE_OTHER): Payer: Medicare Other | Admitting: Pulmonary Disease

## 2017-09-04 ENCOUNTER — Encounter: Payer: Self-pay | Admitting: Internal Medicine

## 2017-09-04 ENCOUNTER — Ambulatory Visit (INDEPENDENT_AMBULATORY_CARE_PROVIDER_SITE_OTHER): Payer: Medicare Other | Admitting: Internal Medicine

## 2017-09-04 ENCOUNTER — Encounter: Payer: Self-pay | Admitting: Pulmonary Disease

## 2017-09-04 VITALS — BP 126/58 | HR 53 | Ht 63.0 in | Wt 147.2 lb

## 2017-09-04 VITALS — BP 134/68 | HR 52 | Ht 63.0 in | Wt 146.0 lb

## 2017-09-04 DIAGNOSIS — C4492 Squamous cell carcinoma of skin, unspecified: Secondary | ICD-10-CM | POA: Diagnosis not present

## 2017-09-04 DIAGNOSIS — I480 Paroxysmal atrial fibrillation: Secondary | ICD-10-CM

## 2017-09-04 DIAGNOSIS — I2510 Atherosclerotic heart disease of native coronary artery without angina pectoris: Secondary | ICD-10-CM

## 2017-09-04 DIAGNOSIS — I483 Typical atrial flutter: Secondary | ICD-10-CM | POA: Diagnosis not present

## 2017-09-04 DIAGNOSIS — IMO0002 Reserved for concepts with insufficient information to code with codable children: Secondary | ICD-10-CM

## 2017-09-04 DIAGNOSIS — Z880 Allergy status to penicillin: Secondary | ICD-10-CM

## 2017-09-04 DIAGNOSIS — R0602 Shortness of breath: Secondary | ICD-10-CM

## 2017-09-04 DIAGNOSIS — J432 Centrilobular emphysema: Secondary | ICD-10-CM | POA: Diagnosis not present

## 2017-09-04 DIAGNOSIS — Z87891 Personal history of nicotine dependence: Secondary | ICD-10-CM
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
## 2017-09-04 NOTE — Patient Instructions (Signed)
Medication Instructions: Your physician recommends that you continue on your current medications as directed. Please refer to the Current Medication list given to you today.  Labwork: None Ordered  Procedures/Testing: None Ordered  Follow-Up: Your physician recommends that you schedule a follow-up appointment in 3-4 MONTHS with Dr. Caryl Comes in [place]   If you need a refill on your cardiac medications before your next appointment, please call your pharmacy.
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
## 2017-09-04 NOTE — Patient Instructions (Signed)
Squamous cell carcinoma: Continue follow-up with thoracic surgery  Physical deconditioning and shortness of breath: We will refer you to pulmonary rehab  Moderate airflow obstruction secondary to centrilobular emphysema/COPD: I am glad your flu shot and pneumonia vaccines are up-to-date Continue [place] Wash your hands frequently over the wintertime  Penicillin allergy: We will refer you to the allergist for evaluation of the severity of this and consideration of desensitization  We will see you back in 3-4 months or sooner if needed

## 2017-09-05 ENCOUNTER — Ambulatory Visit: Payer: Medicare Other | Admitting: Pulmonary Disease

## 2017-09-06 ENCOUNTER — Encounter: Payer: Self-pay | Admitting: Allergy & Immunology

## 2017-09-06 ENCOUNTER — Telehealth: Payer: Self-pay | Admitting: Cardiovascular Disease

## 2017-09-06 ENCOUNTER — Ambulatory Visit (INDEPENDENT_AMBULATORY_CARE_PROVIDER_SITE_OTHER): Payer: Medicare Other | Admitting: Allergy & Immunology

## 2017-09-06 VITALS — BP 120/78 | HR 50 | Temp 97.5°F | Resp 16 | Ht 63.0 in | Wt 146.6 lb

## 2017-09-06 DIAGNOSIS — J302 Other seasonal allergic rhinitis: Secondary | ICD-10-CM | POA: Diagnosis not present

## 2017-09-06 DIAGNOSIS — Z880 Allergy status to penicillin: Secondary | ICD-10-CM

## 2017-09-06 DIAGNOSIS — Z881 Allergy status to other antibiotic agents status: Secondary | ICD-10-CM

## 2017-09-06 NOTE — Patient Instructions (Addendum)
1. Allergy to antibiotic - We will do a penicillin challenge in one week at 1:30pm. - I do not think that you need skin testing since the history is not consistent with a true allergy.  2. Return in about 1 week (around 09/13/2017).  6 Please inform us of any Emergency Department visits, hospitalizations, or changes in symptoms. Call us before going to the ED for breathing or allergy symptoms since we might be able to fit you in for a sick visit. Feel free to contact us anytime with any questions, problems, or concerns.  It was a pleasure to meet you today! Enjoy the fall season!  Websites that have reliable patient information: 1. American Academy of Asthma, Allergy, and Immunology: www.aaaai.org 2. Food Allergy Research and Education (FARE): foodallergy.org 3. Mothers of Asthmatics: http://www.asthmacommunitynetwork.org 4. American College of Allergy, Asthma, and Immunology: www.acaai.org   Election Day is coming up on Tuesday, November 6th! Although it is too late to register to vote by mail, you can still register up to November 5th at any of the early voting locations. Try to early vote in case there are problems with your registration!   If you are turned away at the polls, you have the right to request a provisional ballot, which is required by law!      [place] (open 8am - 5pm) [place], [place] (open [place]) [place], [place] (open 7am - 7pm)  [place], [employer] (open 7am - 7pm) [address], [employer] (open 7am - 7pm) [address] [place], [employer] (open 7am - 7pm) [place], [place] (open [place]) [place], [place] (open 7am - 7pm) [place], [employer] (open  7am - 7pm) [place], [place]

## 2017-09-06 NOTE — Telephone Encounter (Signed)
Notes recorded by Troy Sine, MD on 09/06/2017 at 2:12 PM EDT Lab stable. LDL improved at 74. We aim for less than 70. Glucose improved to 93. Renal function and LFTs stable. Lower extremity Doppler study suggests bilateral iliac and right tibial artery disease. The right ABI is slightly decreased compared to prior exam. Left ABI is now in the moderate range of flow reduction. Consider PV evaluation of symptomatic claudication.  Returned call to patient. Her daughter was notified of her results at her appt today with MD. Patient has PV appt with Dr. Gwenlyn Found tomorrow. She wanted explanation on her LEA report and was concerned that report mentioned aortic atherosclerosis. Explained that this does not mean narrowing of the area, as is the case with her iliac & tibial arteries as noted per study but advised her to seek clarification and further explanation from Dr. Gwenlyn Found tomorrow, since he specialized in [facility] care.

## 2017-09-06 NOTE — Progress Notes (Addendum)
NEW PATIENT  Date of Service/Encounter:  09/06/17  Referring provider: [name], MD   Assessment:   Penicillin allergy  Seasonal allergic rhinitis  Allergy to multiple antibiotics  Plan/Recommendations:   1. Allergy to penicillin - We will do a penicillin challenge in one week at 1:30pm. - I do not think that you need skin testing since the history is not consistent with a true allergy.  - She denied wheezing, urticaria, throat swelling, and other systemic reactions therefore I am not convinced that her original reaction was related to an allergy.  - Given the low risk, we will proceed without the benefit of skin testing.  - I recommended continuing to avoid the clindamycin and the sulfa drugs given the severe reaction to these.  - I also recommended avoiding the amiodarone as well.  - [name] deferred skin testing for environmental allergies since her symptoms are fairly mild.                                                                                                                                                             2. Return in about 1 week (around 09/13/2017) for a penicillin challenge.   Subjective:   [name] is a 71 y.o. female presenting today for evaluation of  Chief Complaint  Patient presents with  . Medication Reaction    [name] has a history of the following: Patient Active Problem List   Diagnosis Date Noted  . Encounter for therapeutic drug monitoring 07/17/2017  . History of thoracotomy 07/04/2017  . S/P lobectomy of lung 07/02/2017  . Elevated troponin 01/04/2017  . Atrial flutter ([place]) 01/02/2017  . Centrilobular emphysema ([place]) 12/29/2016  . Cigarette smoker 12/29/2016  . Snoring 07/21/2016  . Other fatigue 07/21/2016  . Evaluate for OSA (obstructive sleep apnea) 12/07/2015  . Hypotension 03/16/2015  . PAF (paroxysmal atrial fibrillation) ([place]) 03/16/2015  . History of incisional hernia repair 03/11/2015  .  Hyperlipidemia LDL goal <70 11/09/2014  . Abdominal pain, unspecified site 07/13/2014  . Diverticulosis 07/05/2014  . Diverticulitis of colon with perforation s/p Hartmann/colectomy/colostomy Feb 2015 07/05/2014  . DDD (degenerative disc disease), lumbosacral   . Anxiety   . Sjogren's disease ([place])   . Eczema   . Status post colostomy takedown 06/29/2014 04/24/2014  . Pulmonary hypertension ([place]) 02/11/2014  . Chronic systolic CHF (congestive heart failure) ([place]) 02/11/2014  . Chronic pain syndrome 02/11/2014  . Back pain 02/08/2014  . Essential hypertension 11/03/2013  . CAD (coronary artery disease) with CABG 02/21/2013  . Paroxysmal atrial fibrillation ([place]) 01/27/2013  . Long term current use of anticoagulant therapy 01/27/2013    History obtained from: chart review and patient.  [name] was referred by Bridget Pink, MD.     [name]  is a 71 y.o. female with a history of smoking presenting for an evaluation of a penicillin allergy. She has a history of a triple bypass ten years ago. A new months ago, she was diagnosed with lung cancer (very early on picked up by a CT screening). She was referred by [name] (Pulmonologist). She had a lobectomy on August 20th. PET scan showed no other spots and lymph node review was negative. She is now going to be having sentinal CT screens performed. She is looking for the "best ways to protect [herself]". In that light, she is interested in testing for penicillin allergy to see if that could be available for treatment if needed.   She originally reacted to penicillin with blurred vision. She does not remember when it happened in the course of the treatment, but it was definitely while she was on penicillin. She thinks that she might have missed a day of work, but she does not remember what exactly happened or if she completed the course of antibiotics. Of note she has been worked up for [place]' syndrome. She thinks that it was a pill but  she is unsure. She had previously tolerated penicillin on a number of occasions. She does have a Raynaud's phenomenon. She has tolerated all types of penicillins since that time.   Sulfa reaction occurred in 1982. She had paralysis and a confluent rash. Clindamycin reaction occurred in the summer of 2018. She clindamycin for a tooth abscess and developed shortness of breath with chest pain. She did not get steroids for any of these reactions.   She did have atrial fibrillation following her lobectomy. This was originally treated by amiodarone over the summer. She developed tongue swelling, which resolved within 24 hours of stopping the amiodarone   She does have some seasonal allergies that are worse in the fall. She thinks that she might have some ragweed allergies. Otherwise, there is no history of other atopic diseases, including asthma, food allergies, stinging insect allergies, or urticaria. There is no significant infectious history. Vaccinations are up to date.    Past Medical History: Patient Active Problem List   Diagnosis Date Noted  . Encounter for therapeutic drug monitoring 07/17/2017  . History of thoracotomy 07/04/2017  . S/P lobectomy of lung 07/02/2017  . Elevated troponin 01/04/2017  . Atrial flutter ([place]) 01/02/2017  . Centrilobular emphysema ([place]) 12/29/2016  . Cigarette smoker 12/29/2016  . Snoring 07/21/2016  . Other fatigue 07/21/2016  . Evaluate for OSA (obstructive sleep apnea) 12/07/2015  . Hypotension 03/16/2015  . PAF (paroxysmal atrial fibrillation) ([place]) 03/16/2015  . History of incisional hernia repair 03/11/2015  . Hyperlipidemia LDL goal <70 11/09/2014  . Abdominal pain, unspecified site 07/13/2014  . Diverticulosis 07/05/2014  . Diverticulitis of colon with perforation s/p Hartmann/colectomy/colostomy Feb 2015 07/05/2014  . DDD (degenerative disc disease), lumbosacral   . Anxiety   . Sjogren's disease ([place])   . Eczema   . Status post colostomy  takedown 06/29/2014 04/24/2014  . Pulmonary hypertension ([place]) 02/11/2014  . Chronic systolic CHF (congestive heart failure) ([place]) 02/11/2014  . Chronic pain syndrome 02/11/2014  . Back pain 02/08/2014  . Essential hypertension 11/03/2013  . CAD (coronary artery disease) with CABG 02/21/2013  . Paroxysmal atrial fibrillation ([place]) 01/27/2013  . Long term current use of anticoagulant therapy 01/27/2013    Medication List:  Allergies as of 09/06/2017      Reactions   Amiodarone Other (See Comments)   angioedema   Clindamycin/lincomycin Swelling  TROUBLE SWALLOWING......SEVERE CHEST PAIN   Sulfa Antibiotics Photosensitivity, Rash, Other (See Comments)   Red, burning rash & paralysis Burning Rash   Crestor [rosuvastatin] Other (See Comments)   Leg pain MYALGIAS LEG PAIN   Lipitor [atorvastatin] Other (See Comments)   MYALGIAS LEG PAIN   Phenergan [promethazine Hcl] Other (See Comments)   Nervous Leg / Restless Leg Syndrome   Reclast [zoledronic Acid] Other (See Comments)   Flu symptoms - made pt very sick, and had inflammation  In her eye   Penicillin V Potassium Other (See Comments)   Blurred vision   Penicillins Other (See Comments)   Blurred vision   Promethazine Other (See Comments)   Restless leg syndrome Uncontrolled leg shaking   Diltiazem Other (See Comments)   Weakness on oral Dilt   Latex Rash      Medication List       Accurate as of 09/06/17 11:01 PM. Always use your most recent med list.          acetaminophen 500 MG tablet Commonly known as:  TYLENOL Take 500 mg by mouth every 8 (eight) hours as needed for mild pain or moderate pain.   aspirin EC 81 MG tablet Take 81 mg by mouth at bedtime.   CALTRATE 600+D 600-800 MG-UNIT Tabs Generic drug:  Calcium Carb-Cholecalciferol Take 1 tablet by mouth daily.   ezetimibe 10 MG tablet Commonly known as:  ZETIA TAKE 1 TABLET(10 MG) BY MOUTH EVERY EVENING   hydrocortisone valerate cream 0.2 % Commonly  known as:  WESTCORT Apply 1 application topically 3 (three) times a week. On random days - for eczema in ear   isosorbide mononitrate 30 MG 24 hr tablet Commonly known as:  IMDUR Take 1 tablet (30 mg total) by mouth 2 (two) times daily.   lisinopril 20 MG tablet Commonly known as:  PRINIVIL,ZESTRIL Take 1 tablet (20 mg total) by mouth 2 (two) times daily.   metoprolol succinate 50 MG 24 hr tablet Commonly known as:  TOPROL-XL Take 1 tablet (50 mg total) by mouth daily.   metoprolol tartrate 25 MG tablet Commonly known as:  LOPRESSOR TAKE 1 TABLET BY MOUTH EVERY DAY AS NEEDED   nitroGLYCERIN 0.4 MG SL tablet Commonly known as:  NITROSTAT Place 1 tablet (0.4 mg total) under the tongue every 5 (five) minutes x 3 doses as needed for chest pain.   omega-3 acid ethyl esters 1 g capsule Commonly known as:  LOVAZA TAKE 1 CAPSULE BY MOUTH EVERY DAY AT NOON   pyridOXINE 100 MG tablet Commonly known as:  VITAMIN B-6 Take 100 mg by mouth daily.   simvastatin 20 MG tablet Commonly known as:  ZOCOR TAKE 1 TABLET(20 MG) BY MOUTH DAILY   SYSTANE PRESERVATIVE FREE OP Place 1 drop into both eyes 2 (two) times daily.   umeclidinium bromide 62.5 MCG/INH Aepb Commonly known as:  INCRUSE ELLIPTA Inhale 1 puff into the lungs daily.   valACYclovir 500 MG tablet Commonly known as:  VALTREX Take 500 mg by mouth daily as needed. For cold sores   Vitamin D-3 5000 units Tabs Take 5,000 Units by mouth daily.   warfarin 2.5 MG tablet Commonly known as:  COUMADIN Take 1.25-2.5 mg by mouth daily. 1 tablet daily except 1/2 tablet each Sunday Tuesday and Thursday       Birth History: non-contributory.   Developmental History: non-contributory.   Past Surgical History: Past Surgical History:  Procedure Laterality Date  . APPENDECTOMY  1963  . BLEPHAROPLASTY  Bilateral 07/2016  . CARDIAC CATHETERIZATION  09/2007  . CARDIOVERSION N/A 01/04/2017   Procedure: CARDIOVERSION;  Surgeon: Lelon Perla, MD;  [facility] ENDOSCOPY;  Service: Cardiovascular;  Laterality: N/A;  . CERVICAL CONIZATION W/BX  1983  . COLOSTOMY N/A 12/28/2013   Procedure: COLOSTOMY;  Surgeon: Gayland Curry, MD;  Location: [place];  Service: General;  Laterality: N/A;  . COLOSTOMY REVISION N/A 12/28/2013   Procedure: COLON RESECTION SIGMOID;  Surgeon: Gayland Curry, MD;  Location: [place];  Service: General;  Laterality: N/A;  . COLOSTOMY TAKEDOWN N/A 06/29/2014   Procedure: LAPAROSCOPIC ASSISTED HARTMAN REVERSAL, LYSIS OF ADHESIONS, LEFT COLECTOMY, APPLICATION OF WOUND [place];  Surgeon: Gayland Curry, MD;  Location: [facility] ORS;  Service: General;  Laterality: N/A;  . CORONARY ARTERY BYPASS GRAFT  09/2007   Bridget [name]; LIMA-LAD, SVG-D2, SVG-PDA  . [facility] REPAIR Right 12/2015   "@ [facility]"  . INSERTION OF MESH N/A 03/11/2015   Procedure: INSERTION OF MESH;  Surgeon: Greer Pickerel, MD;  Location: [place];  Service: General;  Laterality: N/A;  . LAPAROSCOPIC ASSISTED VENTRAL HERNIA REPAIR N/A 03/11/2015   Procedure: LAPAROSCOPIC ASSISTED VENTRAL INCISIONAL  HERNIA REPAIR POSSIBLE OPEN;  Surgeon: Greer Pickerel, MD;  Location: [place];  Service: General;  Laterality: N/A;  . LAPAROTOMY N/A 12/28/2013   Procedure: EXPLORATORY LAPAROTOMY;  Surgeon: Gayland Curry, MD;  Location: [place];  Service: General;  Laterality: N/A;  Hartman's procedure with splenic flexure mobilization  . NASAL SEPTUM SURGERY  1975  . SKIN CANCER EXCISION  ~ 2006   basal cell on chest wall; precancerous, could turn into melamona, lesion taken off stomach  . THORACOTOMY Left 07/04/2017   Procedure: THORACOTOMY MAJOR; EXPLORATION LEFT CHEST, LIGATION BLEEDING BRONCHIAL ARTERY, EVACUATION HEMATOMA;  Surgeon: Gaye Pollack, MD;  Location: [facility] OR;  Service: Thoracic;  Laterality: Left;  . THORACOTOMY/LOBECTOMY Left 07/02/2017   Procedure: THORACOTOMY/LEFT LOWER LOBECTOMY;  Surgeon: Gaye Pollack, MD;  [facility] OR;  Service: Thoracic;  Laterality: Left;  [name] VENTRAL  HERNIA REPAIR N/A 03/11/2015   Procedure: OPEN VENTRAL INCISIONAL HERNIA REPAIR ADULT;  Surgeon: Greer Pickerel, MD;  Location[facility] OR;  Service: General;  Laterality: N/A;     Family History: Family History  Problem Relation Age of Onset  . CAD Mother        died at 22   . Cancer Mother        breast  . Cancer Brother        non-hodgkins lymphoma     Social History: [place] lives at home by herself. She has a daughter who lives close by with her husband and two children. Her son also lives close by, but travel a lot for work. 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 She lives in a condo with carpeting throughout the home. There is radiator heating and window units for cooling. There are no animals inside or outside of the home. There are no dust mite coverings on the bedding. There is no tobacco exposure. She has not smoked in ten years.     Review of Systems: a 14-point review of systems is pertinent for what is mentioned in HPI.  Otherwise, all other systems were negative. Constitutional: negative other than that listed in the HPI Eyes: negative other than that listed in the HPI Ears, nose, mouth, throat, and face: negative other than that listed in the HPI Respiratory: negative  other than that listed in the HPI Cardiovascular: negative other than that listed in the HPI Gastrointestinal: negative other than that listed in the HPI Genitourinary: negative other than that listed in the HPI Integument: negative other than that listed in the HPI Hematologic: negative other than that listed in the HPI Musculoskeletal: negative other than that listed in the HPI Neurological: negative other than that listed in the HPI Allergy/Immunologic: negative other than that listed in the HPI    Objective:   Blood pressure 120/78, pulse (!) 50, temperature (!) 97.5 F (36.4 C), resp. rate 16, height 5\' 3"  (1.6 m), weight 146 lb 9.6 oz (66.5  kg), SpO2 97 %. Body mass index is 25.97 kg/m.   Physical Exam:  General: Alert, interactive, in no acute distress. Pleasant female.  Eyes: No conjunctival injection bilaterally, no discharge on the right, no discharge on the left and no Horner-Trantas dots present. PERRL bilaterally. EOMI without pain. No photophobia.  Ears: Right TM pearly gray with normal light reflex, Left TM pearly gray with normal light reflex, Right TM intact without perforation and Left TM intact without perforation.  Nose/Throat: External nose within normal limits and septum midline. Turbinates non-edematous without discharge. Posterior oropharynx mildly erythematous without cobblestoning in the posterior oropharynx. Tonsils 2+ without exudates.  Tongue without thrush. Neck: Supple without thyromegaly. Trachea midline. Adenopathy: no enlarged lymph nodes appreciated in the anterior cervical, occipital, axillary, epitrochlear, inguinal, or popliteal regions. Lungs: Clear to auscultation without wheezing, rhonchi or rales. No increased work of breathing. CV: Normal S1/S2. No murmurs. Capillary refill <2 seconds.  Abdomen: Nondistended, nontender. No guarding or rebound tenderness. Bowel sounds present in all fields and hypoactive  Skin: Warm and dry, without lesions or rashes. Extremities:  No clubbing, cyanosis or edema. Neuro:   Grossly intact. No focal deficits appreciated. Responsive to questions.  Diagnostic studies: none    Salvatore Marvel, MD [facility] [place]

## 2017-09-06 NOTE — Telephone Encounter (Signed)
[name] Is calling to have results of her test she had explained to her . Please call

## 2017-09-07 ENCOUNTER — Ambulatory Visit (INDEPENDENT_AMBULATORY_CARE_PROVIDER_SITE_OTHER): Payer: Medicare Other | Admitting: Cardiovascular Disease

## 2017-09-07 ENCOUNTER — Encounter: Payer: Self-pay | Admitting: Cardiovascular Disease

## 2017-09-07 VITALS — BP 150/68 | HR 50 | Ht 63.0 in | Wt 147.8 lb

## 2017-09-07 DIAGNOSIS — I739 Peripheral vascular disease, unspecified: Secondary | ICD-10-CM | POA: Insufficient documentation

## 2017-09-07 NOTE — Assessment & Plan Note (Signed)
[name] is referred to me by Dr. Claiborne Billings for evaluation of claudication. She does have a history of hypertension, hyperlipidemia and remote tobacco abuse. She's had coronary artery bypass grafting 10 years ago. She complains of some lifestyle including claudication as well as some coolness in her left leg at night. Recent Dopplers performed in our office 08/13/17 revealed a right ABI of 0.84 with moderate disease in her right iliac and left ABI of 0.72 with no obvious high-grade obstruction. On exam her femoral arteries are 2+ without bruits. This point, I favor conservative treatment with ongoing Doppler surveillance annual basis.

## 2017-09-07 NOTE — Patient Instructions (Signed)
Medication Instructions: Your physician recommends that you continue on your current medications as directed. Please refer to the Current Medication list given to you today.   Testing/Procedures: Your physician has requested that you have a lower extremity arterial duplex. During this test, ultrasound is used to evaluate arterial blood flow in the legs. Allow one hour for this exam. There are no restrictions or special instructions.  Your physician has requested that you have an ankle brachial index (ABI). During this test an ultrasound and blood pressure cuff are used to evaluate the arteries that supply the arms and legs with blood. Allow thirty minutes for this exam. There are no restrictions or special instructions. IN 1 YEAR--PRIOR TO APPT WITH DR. Gwenlyn Found.  Follow-Up: Your physician wants you to follow-up in: 1 year with Dr. Veleta Miners dopplers. You will receive a reminder letter in the mail two months in advance. If you don't receive a letter, please call our office to schedule the follow-up appointment.  If you need a refill on your cardiac medications before your next appointment, please call your pharmacy.

## 2017-09-07 NOTE — Progress Notes (Signed)
09/07/2017 [name]   Sep 14, 1946  [id number]  Primary Physician Bridget Pink, MD Primary Cardiologist: Bridget Harp MD [name], [place]  HPI:  [name] is a 71 y.o. female divorced, mother of 2, grandmother to grandchildren referred by [name] for peripheral vascular evaluation because of lifestyle limiting claudication. She has a history of coronary artery disease status post bypass grafting 3 2008. She has COPD with 40 pack years tobacco abuse having quit 10 years ago as well as history of hypertension and hyperlipidemia. She's also had squamous cell carcinoma surgically removed with a VATS by [name] 07/02/17 with some perioperative A. fib. She denies respiratory shortness of breath. She does complain of somewhat limiting claudication. She did have remote Dopplers in 2011 revealed normal ABIs however most recent Dopplers performed 08/13/17 revealed a right ABI of 0.84 and a left ABI of .72. She did have mild to moderate iliac disease.   Current Meds  Medication Sig  . acetaminophen (TYLENOL) 500 MG tablet Take 500 mg by mouth every 8 (eight) hours as needed for mild pain or moderate pain.  [name] aspirin EC 81 MG tablet Take 81 mg by mouth at bedtime.  . Calcium Carb-Cholecalciferol (CALTRATE 600+D) 600-800 MG-UNIT TABS Take 1 tablet by mouth daily.   . Cholecalciferol (VITAMIN D-3) 5000 UNITS TABS Take 5,000 Units by mouth daily.   [name] ezetimibe (ZETIA) 10 MG tablet TAKE 1 TABLET(10 MG) BY MOUTH EVERY EVENING  . hydrocortisone valerate cream (WESTCORT) 0.2 % Apply 1 application topically 3 (three) times a week. On random days - for eczema in ear  . isosorbide mononitrate (IMDUR) 30 MG 24 hr tablet Take 1 tablet (30 mg total) by mouth 2 (two) times daily.  [name] lisinopril (PRINIVIL,ZESTRIL) 20 MG tablet Take 1 tablet (20 mg total) by mouth 2 (two) times daily.  . metoprolol succinate (TOPROL-XL) 50 MG 24 hr tablet Take 1 tablet (50 mg total) by mouth daily.  .  metoprolol tartrate (LOPRESSOR) 25 MG tablet TAKE 1 TABLET BY MOUTH EVERY DAY AS NEEDED  . nitroGLYCERIN (NITROSTAT) 0.4 MG SL tablet Place 1 tablet (0.4 mg total) under the tongue every 5 (five) minutes x 3 doses as needed for chest pain.  [name] omega-3 acid ethyl esters (LOVAZA) 1 g capsule TAKE 1 CAPSULE BY MOUTH EVERY DAY AT NOON  . Polyethyl Glycol-Propyl Glycol (SYSTANE PRESERVATIVE FREE OP) Place 1 drop into both eyes 2 (two) times daily.   [name] pyridOXINE (VITAMIN B-6) 100 MG tablet Take 100 mg by mouth daily.  . simvastatin (ZOCOR) 20 MG tablet TAKE 1 TABLET(20 MG) BY MOUTH DAILY  . umeclidinium bromide (INCRUSE ELLIPTA) 62.5 MCG/INH AEPB Inhale 1 puff into the lungs daily.  . valACYclovir (VALTREX) 500 MG tablet Take 500 mg by mouth daily as needed. For cold sores  . warfarin (COUMADIN) 2.5 MG tablet Take 1.25-2.5 mg by mouth daily. 1 tablet daily except 1/2 tablet each Sunday Tuesday and Thursday     Allergies  Allergen Reactions  . Amiodarone Other (See Comments)    angioedema  . Clindamycin/Lincomycin Swelling    TROUBLE SWALLOWING......SEVERE CHEST PAIN  . Sulfa Antibiotics Photosensitivity, Rash and Other (See Comments)    Red, burning rash & paralysis Burning Rash  . Crestor [Rosuvastatin] Other (See Comments)    Leg pain MYALGIAS LEG PAIN  . Lipitor [Atorvastatin] Other (See Comments)    MYALGIAS LEG PAIN  . Phenergan [Promethazine Hcl] Other (See Comments)  Nervous Leg / Restless Leg Syndrome  . Reclast [Zoledronic Acid] Other (See Comments)    Flu symptoms - made pt very sick, and had inflammation  In her eye  . Penicillin V Potassium Other (See Comments)    Blurred vision  . Penicillins Other (See Comments)    Blurred vision  . Promethazine Other (See Comments)    Restless leg syndrome Uncontrolled leg shaking  . Diltiazem Other (See Comments)    Weakness on oral Dilt  . Latex Rash    Social History   Social History  . Marital status: Divorced    Spouse  name: N/A  . Number of children: 2  . Years of education: N/A   Occupational History  . Retired    Social History Main Topics  . Smoking status: Former Smoker    Packs/day: 1.00    Years: 40.00    Types: Cigarettes    Quit date: 09/15/2007  . Smokeless tobacco: Never Used  . Alcohol use 3.6 oz/week    6 Glasses of wine per week  . Drug use: No  . Sexual activity: No   Other Topics Concern  . Not on file   Social History Narrative   She lives in [place], [place]. Her daughter helps with her care.      Review of Systems: General: negative for chills, fever, night sweats or weight changes.  Cardiovascular: negative for chest pain, dyspnea on exertion, edema, orthopnea, palpitations, paroxysmal nocturnal dyspnea or shortness of breath Dermatological: negative for rash Respiratory: negative for cough or wheezing Urologic: negative for hematuria Abdominal: negative for nausea, vomiting, diarrhea, bright red blood per rectum, melena, or hematemesis Neurologic: negative for visual changes, syncope, or dizziness All other systems reviewed and are otherwise negative except as noted above.    Blood pressure (!) 150/68, pulse (!) 50, height 5\' 3"  (1.6 m), weight 147 lb 12.8 oz (67 kg).  General appearance: alert and no distress Neck: no adenopathy, no carotid bruit, no JVD, supple, symmetrical, trachea midline and thyroid not enlarged, symmetric, no tenderness/mass/nodules Lungs: clear to auscultation bilaterally Heart: regular rate and rhythm, S1, S2 normal, no murmur, click, rub or gallop Extremities: extremities normal, atraumatic, no cyanosis or edema Pulses: 2+ and symmetric Skin: Skin color, texture, turgor normal. No rashes or lesions Neurologic: Alert and oriented X 3, normal strength and tone. Normal symmetric reflexes. Normal coordination and gait  EKG not performed today  ASSESSMENT AND PLAN:   Claudication in peripheral vascular disease [facility]) [name] is referred  to me by [name] for evaluation of claudication. She does have a history of hypertension, hyperlipidemia and remote tobacco abuse. She's had coronary artery bypass grafting 10 years ago. She complains of some lifestyle including claudication as well as some coolness in her left leg at night. Recent Dopplers performed in our office 08/13/17 revealed a right ABI of 0.84 with moderate disease in her right iliac and left ABI of 0.72 with no obvious high-grade obstruction. On exam her femoral arteries are 2+ without bruits. This point, I favor conservative treatment with ongoing Doppler surveillance annual basis.      Bridget Harp MD FACP,FACC,FAHA, [facility] 09/07/2017 12:04 PM

## 2017-09-07 NOTE — Telephone Encounter (Signed)
Dr. Claiborne Billings reviewed results with daughter (ok per DPR) at [facility].  See result note.

## 2017-09-10 ENCOUNTER — Ambulatory Visit: Payer: Medicare Other | Admitting: Physical Therapy

## 2017-09-10 DIAGNOSIS — Z7409 Other reduced mobility: Secondary | ICD-10-CM | POA: Diagnosis not present

## 2017-09-10 DIAGNOSIS — M546 Pain in thoracic spine: Secondary | ICD-10-CM

## 2017-09-10 DIAGNOSIS — R293 Abnormal posture: Secondary | ICD-10-CM

## 2017-09-11 DIAGNOSIS — L905 Scar conditions and fibrosis of skin: Secondary | ICD-10-CM | POA: Diagnosis not present

## 2017-09-11 DIAGNOSIS — C44722 Squamous cell carcinoma of skin of right lower limb, including hip: Secondary | ICD-10-CM | POA: Diagnosis not present
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
## 2017-09-12 ENCOUNTER — Ambulatory Visit: Payer: Medicare Other | Admitting: Physical Therapy

## 2017-09-13 ENCOUNTER — Ambulatory Visit (INDEPENDENT_AMBULATORY_CARE_PROVIDER_SITE_OTHER): Payer: Medicare Other | Admitting: Allergy & Immunology

## 2017-09-13 ENCOUNTER — Encounter: Payer: Self-pay | Admitting: Allergy & Immunology

## 2017-09-13 VITALS — BP 110/50 | HR 88 | Resp 16

## 2017-09-13 DIAGNOSIS — Z880 Allergy status to penicillin: Secondary | ICD-10-CM | POA: Diagnosis not present

## 2017-09-13 NOTE — Patient Instructions (Addendum)
1. Penicillin allergy - You tolerated the penicillin today, therefore you should be able to tolerate any of the drugs in that class. - We will remove this from your allergy list.  2. Return if symptoms worsen or fail to improve.    Please inform us of any Emergency Department visits, hospitalizations, or changes in symptoms. Call us before going to the ED for breathing or allergy symptoms since we might be able to fit you in for a sick visit. Feel free to contact us anytime with any questions, problems, or concerns.  It was a pleasure to see you again today! Enjoy the fall season!  Websites that have reliable patient information: 1. American Academy of Asthma, Allergy, and Immunology: www.aaaai.org 2. Food Allergy Research and Education (FARE): foodallergy.org 3. Mothers of Asthmatics: http://www.asthmacommunitynetwork.org 4. American College of Allergy, Asthma, and Immunology: www.acaai.org   Election Day is coming up on Tuesday, November 6th! Although it is too late to register to vote by mail, you can still register up to November 5th at any of the early voting locations. Try to early vote in case there are problems with your registration!   If you are turned away at the polls, you have the right to request a provisional ballot, which is required by law!      [place] (open 8am - 5pm) First Floor [place], [place] (open [place]) [place], [place] (open 7am - 7pm)  [place], [employer] (open 7am - 7pm) [address], [employer] (open 7am - 7pm) [address] [place], [employer] (open 7am - 7pm) [place], [place] (open [place]) [place], [place] (open 7am - 7pm) [place], [employer]  (open 7am - 7pm) [place], [place]

## 2017-09-13 NOTE — Progress Notes (Signed)
FOLLOW UP  Date of Service/Encounter:  09/13/17   Assessment:   Penicillin allergy - passed challenge today  Plan/Recommendations:   1. Penicillin allergy - You tolerated the penicillin today, therefore you should be able to tolerate any of the drugs in that class. - We will remove this from your allergy list.  2. Return if symptoms worsen or fail to improve.  Subjective:   LASHELLE KOY is a 71 y.o. female presenting today for follow up of  Chief Complaint  Patient presents with  . Food/Drug Challenge    LUPITA ROSALES has a history of the following: Patient Active Problem List   Diagnosis Date Noted  . Claudication in peripheral vascular disease ([place]) 09/07/2017  . Encounter for therapeutic drug monitoring 07/17/2017  . History of thoracotomy 07/04/2017  . S/P lobectomy of lung 07/02/2017  . Elevated troponin 01/04/2017  . Atrial flutter ([place]) 01/02/2017  . Centrilobular emphysema ([place]) 12/29/2016  . Cigarette smoker 12/29/2016  . Snoring 07/21/2016  . Other fatigue 07/21/2016  . Evaluate for OSA (obstructive sleep apnea) 12/07/2015  . Hypotension 03/16/2015  . PAF (paroxysmal atrial fibrillation) ([place]) 03/16/2015  . History of incisional hernia repair 03/11/2015  . Hyperlipidemia LDL goal <70 11/09/2014  . Abdominal pain, unspecified site 07/13/2014  . Diverticulosis 07/05/2014  . Diverticulitis of colon with perforation s/p Hartmann/colectomy/colostomy Feb 2015 07/05/2014  . DDD (degenerative disc disease), lumbosacral   . Anxiety   . Sjogren's disease ([place])   . Eczema   . Status post colostomy takedown 06/29/2014 04/24/2014  . Pulmonary hypertension ([place]) 02/11/2014  . Chronic systolic CHF (congestive heart failure) ([place]) 02/11/2014  . Chronic pain syndrome 02/11/2014  . Back pain 02/08/2014  . Essential hypertension 11/03/2013  . CAD (coronary artery disease) with CABG 02/21/2013  . Paroxysmal atrial fibrillation ([place]) 01/27/2013  . Long term  current use of anticoagulant therapy 01/27/2013    History obtained from: chart review and patient.  Royston Cowper [place]'s Primary Care Provider is [name], MD.     Wilda is a 71 y.o. female presenting for a penicillin challenge.  She was last seen 1 week ago with concerns.  At time, her history was insistent.  Her reaction occurred in the 1980s characterized by blurry vision.  Otherwise, she had no problems with wheezing, hives, or breath.  Because of the reassuring history, we decided to bring her in for a direct penicillin challenge.   Since the last visit, she has done well. She reports feeling well today without a fever or URI symptoms. She denies any skin problems. Otherwise, there have been no changes to her past medical history, surgical history, family history, or social history.    Review of Systems: a 14-point review of systems is pertinent for what is mentioned in HPI.  Otherwise, all other systems were negative. Constitutional: negative other than that listed in the HPI Eyes: negative other than that listed in the HPI Ears, nose, mouth, throat, and face: negative other than that listed in the HPI Respiratory: negative other than that listed in the HPI Cardiovascular: negative other than that listed in the HPI Gastrointestinal: negative other than that listed in the HPI Genitourinary: negative other than that listed in the HPI Integument: negative other than that listed in the HPI Hematologic: negative other than that listed in the HPI Musculoskeletal: negative other than that listed in the HPI Neurological: negative other than that listed in the HPI Allergy/Immunologic: negative other than that listed in the HPI  Objective:   Blood pressure (!) 110/50, pulse 88, resp. rate 16, SpO2 97 %. There is no height or weight on file to calculate BMI.   Physical Exam: deferred since this was an oral challenge appointment only   Diagnostic studies:   Open graded  penicillin oral challenge: The patient was able to tolerate the challenge today without adverse signs or symptoms. Vital signs were stable throughout the challenge and observation period. We gave her 50mg  (20mL) of penicillin and she was monitored for 30 minutes. Then she was given 450mg  (109mL) of penicillin and was monitored for one hour.     Salvatore Marvel, MD [place] [facility] [place]

## 2017-09-14 ENCOUNTER — Ambulatory Visit (INDEPENDENT_AMBULATORY_CARE_PROVIDER_SITE_OTHER): Payer: Medicare Other | Admitting: Pharmacist Clinician (PhC)/ Clinical Pharmacy Specialist

## 2017-09-14 DIAGNOSIS — I483 Typical atrial flutter: Secondary | ICD-10-CM

## 2017-09-14 DIAGNOSIS — Z5181 Encounter for therapeutic drug level monitoring: Secondary | ICD-10-CM | POA: Diagnosis not present

## 2017-09-14 DIAGNOSIS — I480 Paroxysmal atrial fibrillation: Secondary | ICD-10-CM | POA: Diagnosis not present

## 2017-09-14 LAB — POCT INR: INR: 1.7

## 2017-09-17 ENCOUNTER — Encounter: Payer: Medicare Other | Attending: Pulmonary Disease

## 2017-09-17 VITALS — Ht 63.6 in | Wt 148.5 lb

## 2017-09-17 DIAGNOSIS — J432 Centrilobular emphysema: Secondary | ICD-10-CM | POA: Diagnosis not present

## 2017-09-17 NOTE — Patient Instructions (Signed)
Patient Instructions  Patient Details  Name: [name] MRN: [id number] Date of Birth: [birth year] Referring Provider:  Juanito Doom, MD  Below are the personal goals you chose as well as exercise and nutrition goals. Our goal is to help you keep on track towards obtaining and maintaining your goals. We will be discussing your progress on these goals with you throughout the program.  Initial Exercise Prescription: Initial Exercise Prescription - 09/17/17 1600      Date of Initial Exercise RX and Referring Provider   Date  09/17/17    Referring Provider  Simonne Maffucci MD      Treadmill   MPH  2    Grade  0    Minutes  15    METs  2.67      NuStep   Level  1    SPM  80    Minutes  15    METs  2.5      REL-XR   Level  1    Speed  50    Minutes  15    METs  2.5      Prescription Details   Frequency (times per week)  3    Duration  Progress to 45 minutes of aerobic exercise without signs/symptoms of physical distress      Intensity   THRR 40-80% of Max Heartrate  86-127    Ratings of Perceived Exertion  11-13    Perceived Dyspnea  0-4      Progression   Progression  Continue to progress workloads to maintain intensity without signs/symptoms of physical distress.      Resistance Training   Training Prescription  Yes    Weight  3 lbs    Reps  10-15       Exercise Goals: Frequency: Be able to perform aerobic exercise three times per week working toward 3-5 days per week.  Intensity: Work with a perceived exertion of 11 (fairly light) - 15 (hard) as tolerated. Follow your new exercise prescription and watch for changes in prescription as you progress with the program. Changes will be reviewed with you when they are made.  Duration: You should be able to do 30 minutes of continuous aerobic exercise in addition to a 5 minute warm-up and a 5 minute cool-down routine.  Nutrition Goals: Your personal nutrition goals will be established when you do your  nutrition analysis with the dietician.  The following are nutrition guidelines to follow: Cholesterol < 200mg /day Sodium < 1500mg /day Fiber: Women over 50 yrs - 21 grams per day  Personal Goals: Personal Goals and Risk Factors at Admission - 09/17/17 1507      Core Components/Risk Factors/Patient Goals on Admission    Weight Management  Yes;Weight Loss    Intervention  Weight Management: Develop a combined nutrition and exercise program designed to reach desired caloric intake, while maintaining appropriate intake of nutrient and fiber, sodium and fats, and appropriate energy expenditure required for the weight goal.;Weight Management: Provide education and appropriate resources to help participant work on and attain dietary goals.;Weight Management/Obesity: Establish reasonable short term and long term weight goals.    Admit Weight  148 lb 8 oz (67.4 kg)    Goal Weight: Short Term  141 lb (64 kg)    Goal Weight: Long Term  136 lb (61.7 kg)    Expected Outcomes  Short Term: Continue to assess and modify interventions until short term weight is achieved;Long Term: Adherence to nutrition and physical  activity/exercise program aimed toward attainment of established weight goal;Weight Maintenance: Understanding of the daily nutrition guidelines, which includes 25-35% calories from fat, 7% or less cal from saturated fats, less than 200mg  cholesterol, less than 1.5gm of sodium, & 5 or more servings of fruits and vegetables daily;Weight Loss: Understanding of general recommendations for a balanced deficit meal plan, which promotes 1-2 lb weight loss per week and includes a negative energy balance of [id number] kcal/d;Understanding recommendations for meals to include 15-35% energy as protein, 25-35% energy from fat, 35-60% energy from carbohydrates, less than 200mg  of dietary cholesterol, 20-35 gm of total fiber daily;Understanding of distribution of calorie intake throughout the day with the consumption of  4-5 meals/snacks    Improve shortness of breath with ADL's  Yes    Intervention  Provide education, individualized exercise plan and daily activity instruction to help decrease symptoms of SOB with activities of daily living.    Expected Outcomes  Short Term: Achieves a reduction of symptoms when performing activities of daily living.    Heart Failure  Yes    Intervention  Provide a combined exercise and nutrition program that is supplemented with education, support and counseling about heart failure. Directed toward relieving symptoms such as shortness of breath, decreased exercise tolerance, and extremity edema.    Expected Outcomes  Improve functional capacity of life;Short term: Attendance in program 2-3 days a week with increased exercise capacity. Reported lower sodium intake. Reported increased fruit and vegetable intake. Reports medication compliance.;Short term: Daily weights obtained and reported for increase. Utilizing diuretic protocols set by physician.;Long term: Adoption of self-care skills and reduction of barriers for early signs and symptoms recognition and intervention leading to self-care maintenance.    Hypertension  Yes    Intervention  Provide education on lifestyle modifcations including regular physical activity/exercise, weight management, moderate sodium restriction and increased consumption of fresh fruit, vegetables, and low fat dairy, alcohol moderation, and smoking cessation.;Monitor prescription use compliance.    Expected Outcomes  Long Term: Maintenance of blood pressure at goal levels.;Short Term: Continued assessment and intervention until BP is < 140/25mm HG in hypertensive participants. < 130/99mm HG in hypertensive participants with diabetes, heart failure or chronic kidney disease.    Lipids  Yes takes medication   takes medication   Intervention  Provide education and support for participant on nutrition & aerobic/resistive exercise along with prescribed  medications to achieve LDL 70mg , HDL >40mg .    Expected Outcomes  Long Term: Cholesterol controlled with medications as prescribed, with individualized exercise RX and with personalized nutrition plan. Value goals: LDL < 70mg , HDL > 40 mg.;Short Term: Participant states understanding of desired cholesterol values and is compliant with medications prescribed. Participant is following exercise prescription and nutrition guidelines.       Tobacco Use Initial Evaluation: Social History   Tobacco Use  Smoking Status Former Smoker  . Packs/day: 1.00  . Years: 40.00  . Pack years: 40.00  . Types: Cigarettes  . Last attempt to quit: 09/15/2007  . Years since quitting: 10.0  Smokeless Tobacco Never Used    Exercise Goals and Review: Exercise Goals    Row Name 09/17/17 1610             Exercise Goals   Increase Physical Activity  Yes       Intervention  Provide advice, education, support and counseling about physical activity/exercise needs.;Develop an individualized exercise prescription for aerobic and resistive training based on initial evaluation findings, risk stratification, comorbidities and participant's  personal goals.       Expected Outcomes  Achievement of increased cardiorespiratory fitness and enhanced flexibility, muscular endurance and strength shown through measurements of functional capacity and personal statement of participant.       Increase Strength and Stamina  Yes       Intervention  Provide advice, education, support and counseling about physical activity/exercise needs.;Develop an individualized exercise prescription for aerobic and resistive training based on initial evaluation findings, risk stratification, comorbidities and participant's personal goals.       Expected Outcomes  Achievement of increased cardiorespiratory fitness and enhanced flexibility, muscular endurance and strength shown through measurements of functional capacity and personal statement of  participant.       Able to understand and use rate of perceived exertion (RPE) scale  Yes       Intervention  Provide education and explanation on how to use RPE scale       Expected Outcomes  Short Term: Able to use RPE daily in rehab to express subjective intensity level;Long Term:  Able to use RPE to guide intensity level when exercising independently       Able to understand and use Dyspnea scale  Yes       Intervention  Provide education and explanation on how to use Dyspnea scale       Expected Outcomes  Short Term: Able to use Dyspnea scale daily in rehab to express subjective sense of shortness of breath during exertion;Long Term: Able to use Dyspnea scale to guide intensity level when exercising independently       Knowledge and understanding of Target Heart Rate Range (THRR)  Yes       Intervention  Provide education and explanation of THRR including how the numbers were predicted and where they are located for reference       Expected Outcomes  Short Term: Able to state/look up THRR;Long Term: Able to use THRR to govern intensity when exercising independently;Short Term: Able to use daily as guideline for intensity in rehab       Able to check pulse independently  Yes       Intervention  Provide education and demonstration on how to check pulse in carotid and radial arteries.;Review the importance of being able to check your own pulse for safety during independent exercise       Expected Outcomes  Short Term: Able to explain why pulse checking is important during independent exercise;Long Term: Able to check pulse independently and accurately       Understanding of Exercise Prescription  Yes       Intervention  Provide education, explanation, and written materials on patient's individual exercise prescription       Expected Outcomes  Long Term: Able to explain home exercise prescription to exercise independently;Short Term: Able to explain program exercise prescription          Copy of  goals given to participant.

## 2017-09-17 NOTE — Progress Notes (Signed)
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 technique of diaphragmatic breathing and pursed-lipped breathing. Applies individual practice skills.   Pulmonary Rehab from 09/17/2017 in [facility]  Date  09/17/17  Educator  [facility]  Instruction Review Code  1- Verbalizes Understanding      Anatomy and Physiology of the Lungs: - Group verbal and written instruction with the use of models to provide basic lung anatomy and physiology related to function, structure and complications of lung disease.   Anatomy & Physiology of the Heart: - Group verbal and written instruction and models provide basic cardiac anatomy and physiology, with the coronary electrical and arterial systems. Review of: AMI, Angina, Valve disease, Heart Failure, Cardiac Arrhythmia, Pacemakers, and the ICD.   Heart Failure: - Group verbal and written  instruction on the basics of heart failure: signs/symptoms, treatments, explanation of ejection fraction, enlarged heart and  cardiomyopathy.   Sleep Apnea: - Individual verbal and written instruction to review Obstructive Sleep Apnea. Review of risk factors, methods for diagnosing and types of masks and machines for OSA.   Anxiety: - Provides group, verbal and written instruction on the correlation between heart/lung disease and anxiety, treatment options, and management of anxiety.   Relaxation: - Provides group, verbal and written instruction about the benefits of relaxation for patients with heart/lung disease. Also provides patients with examples of relaxation techniques.   Cardiac Medications: - Group verbal and written instruction to review commonly prescribed medications for heart disease. Reviews the medication, class of the drug, and side effects.   Know Your Numbers: -Group verbal and written instruction about important numbers in your health.  Review of Cholesterol, Blood Pressure, Diabetes, and BMI and the role they play in your overall health.   Other: -Provides group and verbal instruction on various topics (see comments)    Knowledge Questionnaire Score: Knowledge Questionnaire Score - 09/17/17 1537      Knowledge Questionnaire Score   Pre Score  17/18 Reviewed with patient   Reviewed with patient       Core Components/Risk Factors/Patient Goals at Admission: Personal Goals and Risk Factors at Admission - 09/17/17 1507      Core Components/Risk Factors/Patient Goals on Admission    Weight Management  Yes;Weight Loss    Intervention  Weight Management: Develop a combined nutrition and exercise program designed to reach desired caloric intake, while maintaining appropriate intake of nutrient and fiber, sodium and fats, and appropriate energy expenditure required for the weight goal.;Weight Management: Provide education and appropriate resources to help participant work on and attain dietary goals.;Weight Management/Obesity: Establish reasonable short term and long term weight goals.     Admit Weight  148 lb 8 oz (67.4 kg)    Goal Weight: Short Term  141 lb (64 kg)    Goal Weight: Long Term  136 lb (61.7 kg)    Expected Outcomes  Short Term: Continue to assess and modify interventions until short term weight is achieved;Long Term: Adherence to nutrition and physical activity/exercise program aimed toward attainment of established weight goal;Weight Maintenance: Understanding of the daily nutrition guidelines, which includes 25-35% calories from fat, 7% or less cal from saturated fats, less than 200mg  cholesterol, less than 1.5gm of sodium, & 5 or more servings of fruits and vegetables daily;Weight Loss: Understanding of general recommendations for a balanced deficit meal plan, which promotes 1-2 lb weight loss per week and includes a negative energy balance of [phone] kcal/d;Understanding recommendations for meals to include 15-35% energy as protein, 25-35% energy from fat, 35-60% energy from carbohydrates, less than 200mg  of dietary cholesterol, 20-35 gm of total fiber daily;Understanding of distribution of calorie intake throughout the day with the consumption of 4-5 meals/snacks    Improve shortness of breath with ADL's  Yes    Intervention  Provide education, individualized exercise plan and daily activity instruction to help decrease symptoms of SOB with activities of daily living.    Expected Outcomes  Short Term: Achieves a reduction of symptoms when performing activities of daily living.    Heart Failure  Yes    Intervention  Provide a combined exercise and nutrition program that is supplemented with education, support and counseling about heart failure. Directed toward relieving symptoms such as shortness of breath, decreased exercise tolerance, and extremity edema.  Expected Outcomes  Improve functional capacity of life;Short term: Attendance in program 2-3 days a week with increased exercise capacity. Reported lower sodium intake. Reported increased fruit and vegetable  intake. Reports medication compliance.;Short term: Daily weights obtained and reported for increase. Utilizing diuretic protocols set by physician.;Long term: Adoption of self-care skills and reduction of barriers for early signs and symptoms recognition and intervention leading to self-care maintenance.    Hypertension  Yes    Intervention  Provide education on lifestyle modifcations including regular physical activity/exercise, weight management, moderate sodium restriction and increased consumption of fresh fruit, vegetables, and low fat dairy, alcohol moderation, and smoking cessation.;Monitor prescription use compliance.    Expected Outcomes  Long Term: Maintenance of blood pressure at goal levels.;Short Term: Continued assessment and intervention until BP is < 140/19mm HG in hypertensive participants. < 130/58mm HG in hypertensive participants with diabetes, heart failure or chronic kidney disease.    Lipids  Yes takes medication   takes medication   Intervention  Provide education and support for participant on nutrition & aerobic/resistive exercise along with prescribed medications to achieve LDL 70mg , HDL >40mg .    Expected Outcomes  Long Term: Cholesterol controlled with medications as prescribed, with individualized exercise RX and with personalized nutrition plan. Value goals: LDL < 70mg , HDL > 40 mg.;Short Term: Participant states understanding of desired cholesterol values and is compliant with medications prescribed. Participant is following exercise prescription and nutrition guidelines.       Core Components/Risk Factors/Patient Goals Review:    Core Components/Risk Factors/Patient Goals at Discharge (Final Review):    ITP Comments: ITP Comments    Row Name 09/17/17 1515           ITP Comments  Medical Evaluation completed. Chart sent for review and changes to Dr. Emily Filbert Director of [place]. Diagnosis can be found in [facility] encounter 09/17/17          Comments:  Initial ITP

## 2017-09-19 ENCOUNTER — Ambulatory Visit: Payer: Medicare Other | Admitting: Physical Therapy

## 2017-09-21 DIAGNOSIS — J432 Centrilobular emphysema: Secondary | ICD-10-CM | POA: Diagnosis not present

## 2017-09-21 NOTE — Progress Notes (Signed)
Daily Session Note  Patient Details  Name: [name] MRN: [id number] Date of Birth: 1946-05-11 Referring Provider:     Pulmonary Rehab from 09/17/2017 in [facility]  Referring Provider  Simonne Maffucci MD      Encounter Date: 09/21/2017  Check In: Session Check In - 09/21/17 1149      Check-In   Location  [facility]-Cardiac & Pulmonary Rehab    Staff Present  Renita Papa, RN BSN;Tyren Dugar Darrin Nipper, [place], ACSM RCEP, Exercise Physiologist    Supervising physician immediately available to respond to emergencies  [name] immediately available ER MD    Physician(s)  Dr. Mable Paris and [facility]    Medication changes reported      No    Fall or balance concerns reported     No    Warm-up and Cool-down  Performed as group-led instruction    Resistance Training Performed  Yes    VAD Patient?  No      Pain Assessment   Currently in Pain?  No/denies          Social History   Tobacco Use  Smoking Status Former Smoker  . Packs/day: 1.00  . Years: 40.00  . Pack years: 40.00  . Types: Cigarettes  . Last attempt to quit: 09/15/2007  . Years since quitting: 10.0  Smokeless Tobacco Never Used    Goals Met:  Exercise tolerated well Queuing for purse lip breathing No report of cardiac concerns or symptoms Strength training completed today  Goals Unmet:  Not Applicable  Comments: First full day of exercise!  Patient was oriented to gym and equipment including functions, settings, policies, and procedures.  Patient's individual exercise prescription and treatment plan were reviewed.  All starting workloads were established based on the results of the 6 minute walk test done at initial orientation visit.  The plan for exercise progression was also introduced and progression will be customized based on patient's performance and goals.   Dr. Emily Filbert is Medical Director for [place] and [name].

## 2017-09-24 DIAGNOSIS — J432 Centrilobular emphysema: Secondary | ICD-10-CM

## 2017-09-24 NOTE — Progress Notes (Signed)
Daily Session Note  Patient Details  Name: [name] MRN: [id number] Date of Birth: Feb 03, 1946 Referring Provider:     Pulmonary Rehab from 09/17/2017 in [facility] Re[facility]  Referring Provider  Simonne Maffucci MD      Encounter Date: 09/24/2017  Check In: Session Check In - 09/24/17 1140      Check-In   Location  [facility] Pul[facility] Re[facility]    Staff Present  Earlean Shawl, BS, ACSM CEP, Exercise Physiologist;Amanda Oletta Darter, BA, ACSM CEP, Exercise Physiologist;Laela Deviney Flavia Shipper    Supervising physician immediately available to respond to emergencies  [name] immediately available ER MD    Physician(s)  Dr. Burlene Arnt and Archie Balboa    Medication changes reported      No    Fall or balance concerns reported     No    Warm-up and Cool-down  Performed as group-led instruction    Resistance Training Performed  Yes    VAD Patient?  No      Pain Assessment   Currently in Pain?  No/denies          Social History   Tobacco Use  Smoking Status Former Smoker  . Packs/day: 1.00  . Years: 40.00  . Pack years: 40.00  . Types: Cigarettes  . Last attempt to quit: 09/15/2007  . Years since quitting: 10.0  Smokeless Tobacco Never Used    Goals Met:  Independence with exercise equipment Exercise tolerated well No report of cardiac concerns or symptoms Strength training completed today  Goals Unmet:  Not Applicable  Comments: Pt able to follow exercise prescription today without complaint.  Will continue to monitor for progression.   Dr. Emily Filbert is Medical Director for [place] and [name].

## 2017-09-25 DIAGNOSIS — D225 Melanocytic nevi of trunk: Secondary | ICD-10-CM | POA: Diagnosis not present

## 2017-09-25 DIAGNOSIS — L570 Actinic keratosis: Secondary | ICD-10-CM | POA: Diagnosis not present

## 2017-09-25 DIAGNOSIS — L821 Other seborrheic keratosis: Secondary | ICD-10-CM | POA: Diagnosis not present

## 2017-09-25 DIAGNOSIS — X32XXXA Exposure to sunlight, initial encounter: Secondary | ICD-10-CM | POA: Diagnosis not present

## 2017-09-25 DIAGNOSIS — Z85828 Personal history of other malignant neoplasm of skin: Secondary | ICD-10-CM | POA: Diagnosis not present

## 2017-09-25 DIAGNOSIS — D2272 Melanocytic nevi of left lower limb, including hip: Secondary | ICD-10-CM | POA: Diagnosis not present

## 2017-09-26 ENCOUNTER — Other Ambulatory Visit: Payer: Self-pay | Admitting: Pulmonary Disease

## 2017-09-28 ENCOUNTER — Ambulatory Visit (INDEPENDENT_AMBULATORY_CARE_PROVIDER_SITE_OTHER): Payer: Medicare Other | Admitting: Pharmacist

## 2017-09-28 DIAGNOSIS — J432 Centrilobular emphysema: Secondary | ICD-10-CM

## 2017-09-28 DIAGNOSIS — Z5181 Encounter for therapeutic drug level monitoring: Secondary | ICD-10-CM | POA: Diagnosis not present

## 2017-09-28 DIAGNOSIS — I480 Paroxysmal atrial fibrillation: Secondary | ICD-10-CM

## 2017-09-28 LAB — POCT INR: INR: 2

## 2017-09-28 NOTE — Progress Notes (Signed)
Daily Session Note  Patient Details  Name: [name] MRN: [id number] Date of Birth: Apr 15, 1946 Referring Provider:     Pulmonary Rehab from 09/17/2017 in [facility] Card[facility] and Pulmonary Rehab  Referring Provider  Simonne Maffucci MD      Encounter Date: 09/28/2017  Check In: Session Check In - 09/28/17 1123      Check-In   Location  [facility]    Staff Present  Renita Papa, RN BSN;Joseph Darrin Nipper, [place], ACSM RCEP, Exercise Physiologist    Supervising physician immediately available to respond to emergencies  [name] immediately available ER MD    Physician(s)  Dr. Corky Downs and Burlene Arnt    Medication changes reported      No    Fall or balance concerns reported     No    Warm-up and Cool-down  Performed as group-led instruction    Resistance Training Performed  Yes    VAD Patient?  No      Pain Assessment   Currently in Pain?  No/denies          Social History   Tobacco Use  Smoking Status Former Smoker  . Packs/day: 1.00  . Years: 40.00  . Pack years: 40.00  . Types: Cigarettes  . Last attempt to quit: 09/15/2007  . Years since quitting: 10.0  Smokeless Tobacco Never Used    Goals Met:  Proper associated with RPD/PD & O2 Sat Independence with exercise equipment Using PLB without cueing & demonstrates good technique Exercise tolerated well No report of cardiac concerns or symptoms Strength training completed today  Goals Unmet:  Not Applicable  Comments: Pt able to follow exercise prescription today without complaint.  Will continue to monitor for progression.    Dr. Emily Filbert is Medical Director for [facility] and [name].

## 2017-10-01 DIAGNOSIS — J432 Centrilobular emphysema: Secondary | ICD-10-CM

## 2017-10-01 NOTE — Progress Notes (Signed)
Daily Session Note  Patient Details  Name: [name] MRN: [id number] Date of Birth: Jul 17, 1946 Referring Provider:     Pulmonary Rehab from 09/17/2017 in [facility] and Pulmonary Rehab  Referring Provider  Simonne Maffucci MD      Encounter Date: 10/01/2017  Check In: Session Check In - 10/01/17 1153      Check-In   Location  [facility] Pul[facility]    Staff Present  Justin Mend Jaci Carrel, BS, ACSM CEP, Exercise Physiologist;Amanda Oletta Darter, [place], ACSM CEP, Exercise Physiologist    Supervising physician immediately available to respond to emergencies  [name] immediately available ER MD    Physician(s)  Dr. Quentin Cornwall and Jimmye Norman    Medication changes reported      No    Fall or balance concerns reported     No    Warm-up and Cool-down  Performed as group-led instruction    Resistance Training Performed  Yes    VAD Patient?  No      Pain Assessment   Currently in Pain?  No/denies          Social History   Tobacco Use  Smoking Status Former Smoker  . Packs/day: 1.00  . Years: 40.00  . Pack years: 40.00  . Types: Cigarettes  . Last attempt to quit: 09/15/2007  . Years since quitting: 10.0  Smokeless Tobacco Never Used    Goals Met:  Independence with exercise equipment Exercise tolerated well No report of cardiac concerns or symptoms Strength training completed today  Goals Unmet:  Not Applicable  Comments: Pt able to follow exercise prescription today without complaint.  Will continue to monitor for progression.   Dr. Emily Filbert is Medical Director for [place] and [name].

## 2017-10-01 NOTE — Progress Notes (Signed)
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 and the stigmas associated  with seeking treatment.   Exercise & Equipment Safety: - Individual verbal instruction and demonstration of equipment use and safety with use of the equipment.   Pulmonary Rehab from 09/28/2017 in [facility] Cardiac and Pulmonary Rehab  Date  09/17/17  Educator  [facility]  Instruction Review Code  1- Verbalizes Understanding      Infection Prevention: - Provides verbal and written material to individual with discussion of infection control including proper hand washing and proper equipment cleaning during exercise session.   Pulmonary Rehab from 09/28/2017 in [facility] Cardiac and Pulmonary Rehab  Date  09/17/17  Educator  [facility]  Instruction Review Code  1- Verbalizes Understanding      Falls Prevention: - Provides verbal and written material to individual with discussion of falls prevention and safety.   Pulmonary  Rehab from 09/28/2017 in [facility] Cardiac and Pulmonary Rehab  Date  09/17/17  Educator  [facility]  Instruction Review Code  1- Verbalizes Understanding      Diabetes: - Individual verbal and written instruction to review signs/symptoms of diabetes, desired ranges of glucose level fasting, after meals and with exercise. Advice that pre and post exercise glucose checks will be done for 3 sessions at entry of program.   Chronic Lung Diseases: - Group verbal and written instruction to review new updates, new respiratory medications, new advancements in procedures and treatments. Provide informative websites and "800" numbers of self-education.   Lung Procedures: - Group verbal and written instruction to describe testing methods done to diagnose lung disease. Review the outcome of test results. Describe the treatment choices: Pulmonary Function Tests, ABGs and oximetry.   Energy Conservation: - Provide group verbal and written instruction for methods to conserve energy, plan and organize activities. Instruct on pacing techniques, use of adaptive equipment and posture/positioning to relieve shortness of breath.   Triggers: - Group verbal and written instruction to review types of environmental controls: home humidity, furnaces, filters, dust mite/pet prevention, HEPA vacuums. To discuss weather changes, air quality and the benefits of nasal washing.   Exacerbations: - Group verbal and written instruction to provide: warning signs, infection symptoms, calling MD promptly, preventive modes, and value of vaccinations. Review: effective airway clearance, coughing and/or vibration techniques. Create an Sports administrator.   Oxygen: - Individual and group verbal and written instruction on oxygen therapy. Includes supplement oxygen, available portable oxygen systems, continuous and intermittent flow rates, oxygen safety, concentrators, and Medicare reimbursement for oxygen.   Pulmonary Rehab from 09/28/2017 in  [facility] Cardiac and Pulmonary Rehab  Date  09/17/17  Educator  [facility]  Instruction Review Code  1- Verbalizes Understanding      Respiratory Medications: - Group verbal and written instruction to review medications for lung disease. Drug class, frequency, complications, importance of spacers, rinsing mouth after steroid MDI's, and proper cleaning methods for nebulizers.   Pulmonary Rehab from 09/28/2017 in [facility] Cardiac and Pulmonary Rehab  Date  09/17/17  Educator  [facility]  Instruction Review Code  1- Verbalizes Understanding      AED/CPR: - Group verbal and written instruction with the use of models to demonstrate the basic use of the AED with the basic ABC's of resuscitation.   Breathing Retraining: - Provides individuals verbal and written instruction on purpose, frequency, and proper technique of diaphragmatic breathing and pursed-lipped breathing. Applies individual practice skills.   Pulmonary Rehab from 09/28/2017 in [facility] Cardiac and Pulmonary Rehab  Date  09/17/17  Educator  [facility]  Instruction Review Code  1- Verbalizes Understanding  Anatomy and Physiology of the Lungs: - Group verbal and written instruction with the use of models to provide basic lung anatomy and physiology related to function, structure and complications of lung disease.   Anatomy & Physiology of the Heart: - Group verbal and written instruction and models provide basic cardiac anatomy and physiology, with the coronary electrical and arterial systems. Review of: AMI, Angina, Valve disease, Heart Failure, Cardiac Arrhythmia, Pacemakers, and the ICD.   Pulmonary Rehab from 09/28/2017 in [facility] Cardiac and Pulmonary Rehab  Date  09/28/17  Educator  [place]  Instruction Review Code  1- Verbalizes Understanding      Heart Failure: - Group verbal and written instruction on the basics of heart failure: signs/symptoms, treatments, explanation of ejection fraction, enlarged heart and cardiomyopathy.   Pulmonary Rehab  from 09/28/2017 in [facility] Cardiac and Pulmonary Rehab  Date  09/28/17  Educator  [place]  Instruction Review Code  1- Verbalizes Understanding      Sleep Apnea: - Individual verbal and written instruction to review Obstructive Sleep Apnea. Review of risk factors, methods for diagnosing and types of masks and machines for OSA.   Anxiety: - Provides group, verbal and written instruction on the correlation between heart/lung disease and anxiety, treatment options, and management of anxiety.   Relaxation: - Provides group, verbal and written instruction about the benefits of relaxation for patients with heart/lung disease. Also provides patients with examples of relaxation techniques.   Cardiac Medications: - Group verbal and written instruction to review commonly prescribed medications for heart disease. Reviews the medication, class of the drug, and side effects.   Know Your Numbers: -Group verbal and written instruction about important numbers in your health.  Review of Cholesterol, Blood Pressure, Diabetes, and BMI and the role they play in your overall health.   Other: -Provides group and verbal instruction on various topics (see comments)    Knowledge Questionnaire Score: Knowledge Questionnaire Score - 09/17/17 1537      Knowledge Questionnaire Score   Pre Score  17/18 Reviewed with patient        Core Components/Risk Factors/Patient Goals at Admission: Personal Goals and Risk Factors at Admission - 09/17/17 1507      Core Components/Risk Factors/Patient Goals on Admission    Weight Management  Yes;Weight Loss    Intervention  Weight Management: Develop a combined nutrition and exercise program designed to reach desired caloric intake, while maintaining appropriate intake of nutrient and fiber, sodium and fats, and appropriate energy expenditure required for the weight goal.;Weight Management: Provide education and appropriate resources to help participant work on and  attain dietary goals.;Weight Management/Obesity: Establish reasonable short term and long term weight goals.    Admit Weight  148 lb 8 oz (67.4 kg)    Goal Weight: Short Term  141 lb (64 kg)    Goal Weight: Long Term  136 lb (61.7 kg)    Expected Outcomes  Short Term: Continue to assess and modify interventions until short term weight is achieved;Long Term: Adherence to nutrition and physical activity/exercise program aimed toward attainment of established weight goal;Weight Maintenance: Understanding of the daily nutrition guidelines, which includes 25-35% calories from fat, 7% or less cal from saturated fats, less than 200mg  cholesterol, less than 1.5gm of sodium, & 5 or more servings of fruits and vegetables daily;Weight Loss: Understanding of general recommendations for a balanced deficit meal plan, which promotes 1-2 lb weight loss per week and includes a negative energy balance of [phone] kcal/d;Understanding recommendations for meals  to include 15-35% energy as protein, 25-35% energy from fat, 35-60% energy from carbohydrates, less than 200mg  of dietary cholesterol, 20-35 gm of total fiber daily;Understanding of distribution of calorie intake throughout the day with the consumption of 4-5 meals/snacks    Improve shortness of breath with ADL's  Yes    Intervention  Provide education, individualized exercise plan and daily activity instruction to help decrease symptoms of SOB with activities of daily living.    Expected Outcomes  Short Term: Achieves a reduction of symptoms when performing activities of daily living.    Heart Failure  Yes    Intervention  Provide a combined exercise and nutrition program that is supplemented with education, support and counseling about heart failure. Directed toward relieving symptoms such as shortness of breath, decreased exercise tolerance, and extremity edema.    Expected Outcomes  Improve functional capacity of life;Short term: Attendance in program 2-3 days a  week with increased exercise capacity. Reported lower sodium intake. Reported increased fruit and vegetable intake. Reports medication compliance.;Short term: Daily weights obtained and reported for increase. Utilizing diuretic protocols set by physician.;Long term: Adoption of self-care skills and reduction of barriers for early signs and symptoms recognition and intervention leading to self-care maintenance.    Hypertension  Yes    Intervention  Provide education on lifestyle modifcations including regular physical activity/exercise, weight management, moderate sodium restriction and increased consumption of fresh fruit, vegetables, and low fat dairy, alcohol moderation, and smoking cessation.;Monitor prescription use compliance.    Expected Outcomes  Long Term: Maintenance of blood pressure at goal levels.;Short Term: Continued assessment and intervention until BP is < 140/33mm HG in hypertensive participants. < 130/39mm HG in hypertensive participants with diabetes, heart failure or chronic kidney disease.    Lipids  Yes takes medication    Intervention  Provide education and support for participant on nutrition & aerobic/resistive exercise along with prescribed medications to achieve LDL 70mg , HDL >40mg .    Expected Outcomes  Long Term: Cholesterol controlled with medications as prescribed, with individualized exercise RX and with personalized nutrition plan. Value goals: LDL < 70mg , HDL > 40 mg.;Short Term: Participant states understanding of desired cholesterol values and is compliant with medications prescribed. Participant is following exercise prescription and nutrition guidelines.       Core Components/Risk Factors/Patient Goals Review:    Core Components/Risk Factors/Patient Goals at Discharge (Final Review):    ITP Comments: ITP Comments    Row Name 09/17/17 1515 10/01/17 0849         ITP Comments  Medical Evaluation completed. Chart sent for review and changes to Dr. Emily Filbert  Director of [place]. Diagnosis can be found in [facility] encounter 09/17/17  30 day review completed. ITP sent to Dr. Emily Filbert Director of [place]. Continue with ITP unless changes are made by physician.           Comments: 30 day review

## 2017-10-02 ENCOUNTER — Telehealth: Payer: Self-pay | Admitting: Internal Medicine

## 2017-10-02 ENCOUNTER — Other Ambulatory Visit: Payer: Self-pay | Admitting: Cardiovascular Disease

## 2017-10-02 MED ORDER — METOPROLOL SUCCINATE ER 25 MG PO TB24: 25.0000 mg | ORAL_TABLET | Freq: Every day | ORAL | 3 refills | Status: DC

## 2017-10-02 MED ORDER — METOPROLOL TARTRATE 25 MG PO TABS: 25.0000 mg | ORAL_TABLET | Freq: Every day | ORAL | 3 refills | Status: DC | PRN

## 2017-10-02 NOTE — Telephone Encounter (Signed)
[name] Is calling because she is having Afib, Aflutter and her heart rate has been running low and she has been really tired and a little dizzy at times . Please call

## 2017-10-02 NOTE — Telephone Encounter (Signed)
Reviewed with Dr. Caryl Comes and instructions given for pt to decrease Toprol to 25 mg daily. I spoke with pt and gave her instructions from Dr. Caryl Comes. Will send new prescription to [facility] on [address] in [place]

## 2017-10-02 NOTE — Telephone Encounter (Signed)
Left message to call back  

## 2017-10-02 NOTE — Telephone Encounter (Signed)
I spoke with pt. She reports feeling very tired recently. Headache for last week. States she has not been in afib since hospitalization in September. Heart rate was 45-46 yesterday at pulmonary rehab. Increased to 65-68 with activity.  Today BP was 174/70 and then 149/61.  Heart rate 48-49.  Is taking Toprol 50 mg daily.  She does not want to go into afib but is concerned about low heart rate and is asking if medications should be adjusted.  Will forward to Dr Caryl Comes for review/recommendations

## 2017-10-03 ENCOUNTER — Telehealth: Payer: Self-pay | Admitting: Internal Medicine

## 2017-10-03 ENCOUNTER — Ambulatory Visit: Payer: Medicare Other | Admitting: Physical Therapy

## 2017-10-03 DIAGNOSIS — J432 Centrilobular emphysema: Secondary | ICD-10-CM

## 2017-10-03 NOTE — Progress Notes (Signed)
Daily Session Note  Patient Details  Name: [name] MRN: [id number] Date of Birth: Jun 13, 1946 Referring Provider:     Pulmonary Rehab from 09/17/2017 in [facility] Cardiac and Pulmonary Rehab  Referring Provider  Simonne Maffucci MD      Encounter Date: 10/03/2017  Check In: Session Check In - 10/03/17 1120      Check-In   Location  [facility]-Cardiac & Pulmonary Rehab    Staff Present  Renita Papa, RN BSN;Pragya Lofaso Darrin Nipper, [place], ACSM RCEP, Exercise Physiologist    Supervising physician immediately available to respond to emergencies  [name] immediately available ER MD    Physician(s)  Drs. Alfred Levins and Archie Balboa    Medication changes reported      No    Fall or balance concerns reported     No    Warm-up and Cool-down  Performed as group-led Higher education careers adviser Performed  Yes    VAD Patient?  No      Pain Assessment   Currently in Pain?  No/denies          Social History   Tobacco Use  Smoking Status Former Smoker  . Packs/day: 1.00  . Years: 40.00  . Pack years: 40.00  . Types: Cigarettes  . Last attempt to quit: 09/15/2007  . Years since quitting: 10.0  Smokeless Tobacco Never Used    Goals Met:  Proper associated with RPD/PD & O2 Sat Independence with exercise equipment Using PLB without cueing & demonstrates good technique Exercise tolerated well No report of cardiac concerns or symptoms Strength training completed today  Goals Unmet:  Not Applicable  Comments: Pt able to follow exercise prescription today without complaint.  Will continue to monitor for progression.  Reviewed home exercise with pt today.  Pt plans [place] continue walking at the track and mall for exercise.  She is already exercising daily but need to work on increasing to 30 minutes of continuous cardio. [name] is going to start adding a minute each time she walks.  Reviewed THR, pulse, RPE, sign and symptoms, and when to call 911 or MD.  Also discussed  weather considerations and indoor options.  Pt voiced understanding.   Dr. Emily Filbert is Medical Director for [place] and [name].

## 2017-10-03 NOTE — Telephone Encounter (Signed)
I spoke with pt and told her it would be OK to use target heart rates given by pulmonary rehab team while exercising at rehab.  Pt requests we check with Dr. Caryl Comes to make sure these heart rate guidelines are OK.

## 2017-10-03 NOTE — Telephone Encounter (Signed)
Patient calling,  1. Patient states that she feels much better after reducing metoprolol  2. Pulmonary therapist states that patient target HR 87-127 when exercising, Patient would like to make sure that this is safe, because she doesn't want to go back into AFIB

## 2017-10-08 DIAGNOSIS — J432 Centrilobular emphysema: Secondary | ICD-10-CM

## 2017-10-08 NOTE — Telephone Encounter (Signed)
Pt.notified

## 2017-10-08 NOTE — Telephone Encounter (Signed)
fne thanks

## 2017-10-08 NOTE — Progress Notes (Signed)
Daily Session Note  Patient Details  Name: [name] MRN: [id number] Date of Birth: 13-May-1946 Referring Provider:     Pulmonary Rehab from 09/17/2017 in [facility] Cardiac and Pulmonary Rehab  Referring Provider  Simonne Maffucci MD      Encounter Date: 10/08/2017  Check In: Session Check In - 10/08/17 1139      Check-In   Location  [facility]-Cardiac & Pulmonary Rehab    Staff Present  Earlean Shawl, BS, ACSM CEP, Exercise Physiologist;Amanda Oletta Darter, BA, ACSM CEP, Exercise Physiologist;Clarion Mooneyhan Flavia Shipper    Supervising physician immediately available to respond to emergencies  [name] immediately available ER MD    Physician(s)  Dr. Quentin Cornwall and Corky Downs    Medication changes reported      No    Fall or balance concerns reported     No    Warm-up and Cool-down  Performed as group-led instruction    Resistance Training Performed  Yes    VAD Patient?  No      Pain Assessment   Currently in Pain?  No/denies          Social History   Tobacco Use  Smoking Status Former Smoker  . Packs/day: 1.00  . Years: 40.00  . Pack years: 40.00  . Types: Cigarettes  . Last attempt to quit: 09/15/2007  . Years since quitting: 10.0  Smokeless Tobacco Never Used    Goals Met:  Independence with exercise equipment Exercise tolerated well No report of cardiac concerns or symptoms Strength training completed today  Goals Unmet:  Not Applicable  Comments: Pt able to follow exercise prescription today without complaint.  Will continue to monitor for progression.   Dr. Emily Filbert is Medical Director for [place] and [name].

## 2017-10-10 DIAGNOSIS — S93402A Sprain of unspecified ligament of left ankle, initial encounter: Secondary | ICD-10-CM | POA: Diagnosis not present

## 2017-10-15 ENCOUNTER — Encounter: Payer: Medicare Other | Attending: Pulmonary Disease

## 2017-10-15 DIAGNOSIS — J432 Centrilobular emphysema: Secondary | ICD-10-CM | POA: Insufficient documentation

## 2017-10-18 ENCOUNTER — Ambulatory Visit (INDEPENDENT_AMBULATORY_CARE_PROVIDER_SITE_OTHER): Payer: Medicare Other | Admitting: Pharmacist

## 2017-10-18 DIAGNOSIS — Z5181 Encounter for therapeutic drug level monitoring: Secondary | ICD-10-CM | POA: Diagnosis not present

## 2017-10-18 DIAGNOSIS — I480 Paroxysmal atrial fibrillation: Secondary | ICD-10-CM

## 2017-10-18 LAB — POCT INR: INR: 1.6

## 2017-10-19 DIAGNOSIS — J432 Centrilobular emphysema: Secondary | ICD-10-CM | POA: Diagnosis not present

## 2017-10-19 NOTE — Progress Notes (Signed)
Daily Session Note  Patient Details  Name: [name] MRN: [id number] Date of Birth: 11-15-1945 Referring Provider:     Pulmonary Rehab from 09/17/2017 in [facility] Cardiac and Pulmonary Rehab  Referring Provider  Simonne Maffucci MD      Encounter Date: 10/19/2017  Check In: Session Check In - 10/19/17 1122      Check-In   Location  [facility]-Cardiac & Pulmonary Rehab    Staff Present  Justin Mend RCP,RRT,BSRT;Krista Frederico Hamman, RN BSN;Cadince Hilscher Luan Pulling, MA, ACSM RCEP, Exercise Physiologist    Supervising physician immediately available to respond to emergencies  [name] immediately available ER MD    Physician(s)  Drs. Lord and [employer]    Medication changes reported      No    Fall or balance concerns reported     No    Warm-up and Cool-down  Performed as group-led Higher education careers adviser Performed  Yes    VAD Patient?  No      Pain Assessment   Currently in Pain?  No/denies          Social History   Tobacco Use  Smoking Status Former Smoker  . Packs/day: 1.00  . Years: 40.00  . Pack years: 40.00  . Types: Cigarettes  . Last attempt to quit: 09/15/2007  . Years since quitting: 10.1  Smokeless Tobacco Never Used    Goals Met:  Independence with exercise equipment Exercise tolerated well No report of cardiac concerns or symptoms Strength training completed today  Goals Unmet:  Not Applicable  Comments: Pt able to follow exercise prescription today without complaint.  Will continue to monitor for progression.    Dr. Emily Filbert is Medical Director for [place] and [name].

## 2017-10-26 DIAGNOSIS — J432 Centrilobular emphysema: Secondary | ICD-10-CM

## 2017-10-26 NOTE — Progress Notes (Signed)
Daily Session Note  Patient Details  Name: [name] MRN: [id number] Date of Birth: 1946-07-21 Referring Provider:     Pulmonary Rehab from 09/17/2017 in [place] Cardiac and Pulmonary Rehab  Referring Provider  Simonne Maffucci MD      Encounter Date: 10/26/2017  Check In: Session Check In - 10/26/17 1123      Check-In   Location  [facility]-Cardiac & Pulmonary Rehab    Staff Present  Justin Mend Lorre Nick, [place], ACSM RCEP, Exercise Physiologist;Meredith Sherryll Burger, RN BSN    Supervising physician immediately available to respond to emergencies  [name] immediately available ER MD    Physician(s)  Dr. Jimmye Norman and Quentin Cornwall    Medication changes reported      No    Fall or balance concerns reported     No    Warm-up and Cool-down  Performed as group-led instruction    Resistance Training Performed  Yes    VAD Patient?  No      Pain Assessment   Currently in Pain?  No/denies          Social History   Tobacco Use  Smoking Status Former Smoker  . Packs/day: 1.00  . Years: 40.00  . Pack years: 40.00  . Types: Cigarettes  . Last attempt to quit: 09/15/2007  . Years since quitting: 10.1  Smokeless Tobacco Never Used    Goals Met:  Independence with exercise equipment Exercise tolerated well No report of cardiac concerns or symptoms Strength training completed today  Goals Unmet:  Not Applicable  Comments: Pt able to follow exercise prescription today without complaint.  Will continue to monitor for progression.   Dr. Emily Filbert is Medical Director for [place] and [name].

## 2017-10-29 DIAGNOSIS — J432 Centrilobular emphysema: Secondary | ICD-10-CM | POA: Diagnosis not present

## 2017-10-29 NOTE — Progress Notes (Signed)
Daily Session Note  Patient Details  Name: [name] MRN: [id number] Date of Birth: 03-02-1946 Referring Provider:     Pulmonary Rehab from 09/17/2017 in [facility] Cardiac and Pulmonary Rehab  Referring Provider  Simonne Maffucci MD      Encounter Date: 10/29/2017  Check In: Session Check In - 10/29/17 1130      Check-In   Location  [facility]-Cardiac & Pulmonary Rehab    Staff Present  Justin Mend RCP,RRT,BSRT;Amanda Oletta Darter, BA, ACSM CEP, Exercise Physiologist;Kelly Amedeo Plenty, BS, ACSM CEP, Exercise Physiologist    Supervising physician immediately available to respond to emergencies  [name] immediately available ER MD    Physician(s)  Dr. Kerman Passey and Owens Shark    Medication changes reported      No    Fall or balance concerns reported     No    Warm-up and Cool-down  Performed as group-led instruction    Resistance Training Performed  Yes    VAD Patient?  No      Pain Assessment   Currently in Pain?  No/denies          Social History   Tobacco Use  Smoking Status Former Smoker  . Packs/day: 1.00  . Years: 40.00  . Pack years: 40.00  . Types: Cigarettes  . Last attempt to quit: 09/15/2007  . Years since quitting: 10.1  Smokeless Tobacco Never Used    Goals Met:  Independence with exercise equipment Exercise tolerated well No report of cardiac concerns or symptoms Strength training completed today  Goals Unmet:  Not Applicable  Comments: Pt able to follow exercise prescription today without complaint.  Will continue to monitor for progression.   Dr. Emily Filbert is Medical Director for [place] and [name].

## 2017-10-29 NOTE — Progress Notes (Signed)
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 deferred the dietician appointment at this time. When she had her heart attack she has changed her eating habits 10 years ago.       Expected Outcome  Short: lose a few pounds. Long: eat healthy and exercise to maintain weight loss          Nutrition Goals Discharge (Final Nutrition Goals Re-Evaluation): Nutrition Goals Re-Evaluation - 10/19/17 1210      Goals   Current Weight  150 lb (68 kg)    Nutrition Goal  Eat a heart healthy diet.    Comment  She has deferred the dietician appointment at this time. When she had her heart attack she has changed her eating habits 10 years ago.    Expected Outcome  Short: lose a few pounds. Long: eat healthy and exercise to maintain weight loss       Psychosocial: Target Goals: Acknowledge presence or absence of significant depression and/or stress, maximize coping skills, provide positive support system. Participant is able to verbalize types  and ability to use techniques and skills needed for reducing stress and depression.   Initial Review & Psychosocial Screening: Initial Psych Review & Screening - 09/17/17 1502      Initial Review   Current issues with  None Identified      Family Dynamics   Good Support System?  Yes    Comments  her two kids and grandchildren are great for support      Barriers   Psychosocial barriers to participate in program  There are no identifiable barriers or psychosocial needs.;The patient should benefit from training in stress management and relaxation.  Screening Interventions   Interventions  Encouraged to exercise;Program counselor consult;Provide feedback about the scores to participant;To provide support and resources with identified psychosocial needs;Yes    Expected Outcomes  Short Term goal: Utilizing psychosocial counselor, staff and physician to assist with identification of specific Stressors or current issues interfering with healing process. Setting desired goal for each stressor or current issue identified.;Long Term Goal: Stressors or current issues are controlled or eliminated.;Short Term goal: Identification and review with participant of any Quality of Life or Depression concerns found by scoring the questionnaire.;Long Term goal: The participant improves quality of Life and PHQ9 Scores as seen by post scores and/or verbalization of changes       Quality of Life Scores:   PHQ-9: Recent Review Flowsheet Data    Depression screen [facility] 2/9 09/17/2017   Decreased Interest 0   Down, Depressed, Hopeless 0   PHQ - 2 Score 0   Altered sleeping 0   Tired, decreased energy 1   Change in appetite 0   Feeling bad or failure about yourself  0   Trouble concentrating 1   Moving slowly or fidgety/restless 0   Suicidal thoughts 0   PHQ-9 Score 2   Difficult doing work/chores Not difficult at all     Interpretation of Total Score  Total Score Depression Severity:  1-4 = Minimal  depression, 5-9 = Mild depression, 10-14 = Moderate depression, 15-19 = Moderately severe depression, 20-27 = Severe depression   Psychosocial Evaluation and Intervention:   Psychosocial Re-Evaluation: Psychosocial Re-Evaluation    [place] Name 10/19/17 1213             Psychosocial Re-Evaluation   Current issues with  Current Stress Concerns       Comments  She gets stressed out when she is short of breath. She has an ankle injury that has slowed her down recently but is working around it. Jodel is happy in general. She has projects to work on that she is behind on due to her surgeries in the past .       Expected Outcomes  Short: continue to exercise in [place]. Long: Maintain an exercise routine to keep stress minimized.       Interventions  Encouraged to attend Pulmonary Rehabilitation for the exercise       Continue Psychosocial Services   Follow up required by staff          Psychosocial Discharge (Final Psychosocial Re-Evaluation): Psychosocial Re-Evaluation - 10/19/17 1213      Psychosocial Re-Evaluation   Current issues with  Current Stress Concerns    Comments  She gets stressed out when she is short of breath. She has an ankle injury that has slowed her down recently but is working around it. Yohana is happy in general. She has projects to work on that she is behind on due to her surgeries in the past .    Expected Outcomes  Short: continue to exercise in [place]. Long: Maintain an exercise routine to keep stress minimized.    Interventions  Encouraged to attend Pulmonary Rehabilitation for the exercise    Continue Psychosocial Services   Follow up required by staff       Education: Education Goals: Education classes will be provided on a weekly basis, covering required topics. Participant will state understanding/return demonstration of topics presented.  Learning Barriers/Preferences: Learning Barriers/Preferences - 09/17/17 1504      Learning  Barriers/Preferences   Learning Barriers  Sight wears Glasses  Learning Preferences  None       Education Topics: Initial Evaluation Education: - Verbal, written and demonstration of respiratory meds, RPE/PD scales, oximetry and breathing techniques. Instruction on use of nebulizers and MDIs: cleaning and proper use, rinsing mouth with steroid doses and importance of monitoring MDI activations.   Pulmonary Rehab from 10/19/2017 in [facility] Cardiac and Pulmonary Rehab  Date  09/17/17  Educator  [facility]  Instruction Review Code  1- Verbalizes Understanding      General Nutrition Guidelines/Fats and Fiber: -Group instruction provided by verbal, written material, models and posters to present the general guidelines for heart healthy nutrition. Gives an explanation and review of dietary fats and fiber.   Controlling Sodium/Reading Food Labels: -Group verbal and written material supporting the discussion of sodium use in heart healthy nutrition. Review and explanation with models, verbal and written materials for utilization of the food label.   Exercise Physiology & Risk Factors: - Group verbal and written instruction with models to review the exercise physiology of the cardiovascular system and associated critical values. Details cardiovascular disease risk factors and the goals associated with each risk factor.   Aerobic Exercise & Resistance Training: - Gives group verbal and written discussion on the health impact of inactivity. On the components of aerobic and resistive training programs and the benefits of this training and how to safely progress through these programs.   Flexibility, Balance, General Exercise Guidelines: - Provides group verbal and written instruction on the benefits of flexibility and balance training programs. Provides general exercise guidelines with specific guidelines to those with heart or lung disease. Demonstration and skill practice provided.   Pulmonary Rehab  from 10/19/2017 in [facility] Cardiac and Pulmonary Rehab  Date  10/03/17  Educator  [place]  Instruction Review Code  2- Demonstrated Understanding      Stress Management: - Provides group verbal and written instruction about the health risks of elevated stress, cause of high stress, and healthy ways to reduce stress.   Depression: - Provides group verbal and written instruction on the correlation between heart/lung disease and depressed mood, treatment options, and the stigmas associated with seeking treatment.   Exercise & Equipment Safety: - Individual verbal instruction and demonstration of equipment use and safety with use of the equipment.   Pulmonary Rehab from 10/19/2017 in [facility] Cardiac and Pulmonary Rehab  Date  09/17/17  Educator  [facility]  Instruction Review Code  1- Verbalizes Understanding      Infection Prevention: - Provides verbal and written material to individual with discussion of infection control including proper hand washing and proper equipment cleaning during exercise session.   Pulmonary Rehab from 10/19/2017 in [facility] Cardiac and Pulmonary Rehab  Date  09/17/17  Educator  [facility]  Instruction Review Code  1- Verbalizes Understanding      Falls Prevention: - Provides verbal and written material to individual with discussion of falls prevention and safety.   Pulmonary Rehab from 10/19/2017 in [facility] Cardiac and Pulmonary Rehab  Date  09/17/17  Educator  [facility]  Instruction Review Code  1- Verbalizes Understanding      Diabetes: - Individual verbal and written instruction to review signs/symptoms of diabetes, desired ranges of glucose level fasting, after meals and with exercise. Advice that pre and post exercise glucose checks will be done for 3 sessions at entry of program.   Chronic Lung Diseases: - Group verbal and written instruction to review new updates, new respiratory medications, new advancements in procedures and treatments. Provide informative websites  and "800"  numbers of self-education.   Lung Procedures: - Group verbal and written instruction to describe testing methods done to diagnose lung disease. Review the outcome of test results. Describe the treatment choices: Pulmonary Function Tests, ABGs and oximetry.   Energy Conservation: - Provide group verbal and written instruction for methods to conserve energy, plan and organize activities. Instruct on pacing techniques, use of adaptive equipment and posture/positioning to relieve shortness of breath.   Triggers: - Group verbal and written instruction to review types of environmental controls: home humidity, furnaces, filters, dust mite/pet prevention, HEPA vacuums. To discuss weather changes, air quality and the benefits of nasal washing.   Exacerbations: - Group verbal and written instruction to provide: warning signs, infection symptoms, calling MD promptly, preventive modes, and value of vaccinations. Review: effective airway clearance, coughing and/or vibration techniques. Create an Sports administrator.   Oxygen: - Individual and group verbal and written instruction on oxygen therapy. Includes supplement oxygen, available portable oxygen systems, continuous and intermittent flow rates, oxygen safety, concentrators, and Medicare reimbursement for oxygen.   Pulmonary Rehab from 10/19/2017 in [facility] Cardiac and Pulmonary Rehab  Date  09/17/17  Educator  [facility]  Instruction Review Code  1- Verbalizes Understanding      Respiratory Medications: - Group verbal and written instruction to review medications for lung disease. Drug class, frequency, complications, importance of spacers, rinsing mouth after steroid MDI's, and proper cleaning methods for nebulizers.   Pulmonary Rehab from 10/19/2017 in [facility] Cardiac and Pulmonary Rehab  Date  09/17/17  Educator  [facility]  Instruction Review Code  1- Verbalizes Understanding      AED/CPR: - Group verbal and written instruction with the use of models to demonstrate  the basic use of the AED with the basic ABC's of resuscitation.   Breathing Retraining: - Provides individuals verbal and written instruction on purpose, frequency, and proper technique of diaphragmatic breathing and pursed-lipped breathing. Applies individual practice skills.   Pulmonary Rehab from 10/19/2017 in [facility] Cardiac and Pulmonary Rehab  Date  09/17/17  Educator  [facility]  Instruction Review Code  1- Verbalizes Understanding      Anatomy and Physiology of the Lungs: - Group verbal and written instruction with the use of models to provide basic lung anatomy and physiology related to function, structure and complications of lung disease.   Anatomy & Physiology of the Heart: - Group verbal and written instruction and models provide basic cardiac anatomy and physiology, with the coronary electrical and arterial systems. Review of: AMI, Angina, Valve disease, Heart Failure, Cardiac Arrhythmia, Pacemakers, and the ICD.   Pulmonary Rehab from 10/19/2017 in [facility] Cardiac and Pulmonary Rehab  Date  09/28/17  Educator  [place]  Instruction Review Code  1- Verbalizes Understanding      Heart Failure: - Group verbal and written instruction on the basics of heart failure: signs/symptoms, treatments, explanation of ejection fraction, enlarged heart and cardiomyopathy.   Pulmonary Rehab from 10/19/2017 in [facility] Cardiac and Pulmonary Rehab  Date  09/28/17  Educator  [place]  Instruction Review Code  1- Verbalizes Understanding      Sleep Apnea: - Individual verbal and written instruction to review Obstructive Sleep Apnea. Review of risk factors, methods for diagnosing and types of masks and machines for OSA.   Anxiety: - Provides group, verbal and written instruction on the correlation between heart/lung disease and anxiety, treatment options, and management of anxiety.   Relaxation: - Provides group, verbal and written instruction about the benefits of relaxation  for patients with heart/lung  disease. Also provides patients with examples of relaxation techniques.   Cardiac Medications: - Group verbal and written instruction to review commonly prescribed medications for heart disease. Reviews the medication, class of the drug, and side effects.   Pulmonary Rehab from 10/19/2017 in [facility] Cardiac and Pulmonary Rehab  Date  10/19/17  Educator  [place]  Instruction Review Code  1- Verbalizes Understanding      Know Your Numbers: -Group verbal and written instruction about important numbers in your health.  Review of Cholesterol, Blood Pressure, Diabetes, and BMI and the role they play in your overall health.   Other: -Provides group and verbal instruction on various topics (see comments)    Knowledge Questionnaire Score: Knowledge Questionnaire Score - 09/17/17 1537      Knowledge Questionnaire Score   Pre Score  17/18 Reviewed with patient        Core Components/Risk Factors/Patient Goals at Admission: Personal Goals and Risk Factors at Admission - 09/17/17 1507      Core Components/Risk Factors/Patient Goals on Admission    Weight Management  Yes;Weight Loss    Intervention  Weight Management: Develop a combined nutrition and exercise program designed to reach desired caloric intake, while maintaining appropriate intake of nutrient and fiber, sodium and fats, and appropriate energy expenditure required for the weight goal.;Weight Management: Provide education and appropriate resources to help participant work on and attain dietary goals.;Weight Management/Obesity: Establish reasonable short term and long term weight goals.    Admit Weight  148 lb 8 oz (67.4 kg)    Goal Weight: Short Term  141 lb (64 kg)    Goal Weight: Long Term  136 lb (61.7 kg)    Expected Outcomes  Short Term: Continue to assess and modify interventions until short term weight is achieved;Long Term: Adherence to nutrition and physical activity/exercise program aimed toward attainment of established weight  goal;Weight Maintenance: Understanding of the daily nutrition guidelines, which includes 25-35% calories from fat, 7% or less cal from saturated fats, less than 200mg  cholesterol, less than 1.5gm of sodium, & 5 or more servings of fruits and vegetables daily;Weight Loss: Understanding of general recommendations for a balanced deficit meal plan, which promotes 1-2 lb weight loss per week and includes a negative energy balance of [phone] kcal/d;Understanding recommendations for meals to include 15-35% energy as protein, 25-35% energy from fat, 35-60% energy from carbohydrates, less than 200mg  of dietary cholesterol, 20-35 gm of total fiber daily;Understanding of distribution of calorie intake throughout the day with the consumption of 4-5 meals/snacks    Improve shortness of breath with ADL's  Yes    Intervention  Provide education, individualized exercise plan and daily activity instruction to help decrease symptoms of SOB with activities of daily living.    Expected Outcomes  Short Term: Achieves a reduction of symptoms when performing activities of daily living.    Heart Failure  Yes    Intervention  Provide a combined exercise and nutrition program that is supplemented with education, support and counseling about heart failure. Directed toward relieving symptoms such as shortness of breath, decreased exercise tolerance, and extremity edema.    Expected Outcomes  Improve functional capacity of life;Short term: Attendance in program 2-3 days a week with increased exercise capacity. Reported lower sodium intake. Reported increased fruit and vegetable intake. Reports medication compliance.;Short term: Daily weights obtained and reported for increase. Utilizing diuretic protocols set by physician.;Long term: Adoption of self-care skills and reduction of barriers for early signs  and symptoms recognition and intervention leading to self-care maintenance.    Hypertension  Yes    Intervention  Provide education on  lifestyle modifcations including regular physical activity/exercise, weight management, moderate sodium restriction and increased consumption of fresh fruit, vegetables, and low fat dairy, alcohol moderation, and smoking cessation.;Monitor prescription use compliance.    Expected Outcomes  Long Term: Maintenance of blood pressure at goal levels.;Short Term: Continued assessment and intervention until BP is < 140/14mm HG in hypertensive participants. < 130/4mm HG in hypertensive participants with diabetes, heart failure or chronic kidney disease.    Lipids  Yes takes medication    Intervention  Provide education and support for participant on nutrition & aerobic/resistive exercise along with prescribed medications to achieve LDL 70mg , HDL >40mg .    Expected Outcomes  Long Term: Cholesterol controlled with medications as prescribed, with individualized exercise RX and with personalized nutrition plan. Value goals: LDL < 70mg , HDL > 40 mg.;Short Term: Participant states understanding of desired cholesterol values and is compliant with medications prescribed. Participant is following exercise prescription and nutrition guidelines.       Core Components/Risk Factors/Patient Goals Review:  Goals and Risk Factor Review    Row Name 10/19/17 1217             Core Components/Risk Factors/Patient Goals Review   Personal Goals Review  Improve shortness of breath with ADL's;Lipids;Hypertension;Heart Failure;Stress;Weight Management/Obesity       Review  Dalonda's Lipids back in October where within normal limits. She is taking her medication as directed for her cholesterol. Her blood pressure has improved slightly since the begining of the program. She feels like her strength has improved and has vacuumed recently and she said she did not get as short of breath.       Expected Outcomes  Short: continue to attend [name] Regularly. Long: Improve blood pressure readings further.          Core  Components/Risk Factors/Patient Goals at Discharge (Final Review):  Goals and Risk Factor Review - 10/19/17 1217      Core Components/Risk Factors/Patient Goals Review   Personal Goals Review  Improve shortness of breath with ADL's;Lipids;Hypertension;Heart Failure;Stress;Weight Management/Obesity    Review  Lamyiah's Lipids back in October where within normal limits. She is taking her medication as directed for her cholesterol. Her blood pressure has improved slightly since the begining of the program. She feels like her strength has improved and has vacuumed recently and she said she did not get as short of breath.    Expected Outcomes  Short: continue to attend [name] Regularly. Long: Improve blood pressure readings further.       ITP Comments: ITP Comments    Row Name 09/17/17 1515 10/01/17 0849 10/29/17 0832       ITP Comments  Medical Evaluation completed. Chart sent for review and changes to Dr. Emily Filbert Director of [place]. Diagnosis can be found in [facility] encounter 09/17/17  30 day review completed. ITP sent to Dr. Emily Filbert Director of [place]. Continue with ITP unless changes are made by physician.    30 day review completed. ITP sent to Dr. Emily Filbert Director of [place]. Continue with ITP unless changes are made by physician.          Comments: 30 day review

## 2017-10-31 DIAGNOSIS — J432 Centrilobular emphysema: Secondary | ICD-10-CM

## 2017-10-31 NOTE — Progress Notes (Signed)
Daily Session Note  Patient Details  Name: ZEIDY TAYAG MRN: [id number] Date of Birth: [birth year] Referring Provider:     Pulmonary Rehab from 09/17/2017 in [facility] Cardiac and Pulmonary Rehab  Referring Provider  Simonne Maffucci MD      Encounter Date: 10/31/2017  Check In: Session Check In - 10/31/17 1137      Check-In   Location  [facility]-Cardiac & Pulmonary Rehab    Staff Present  Renita Papa, RN BSN;Joseph Darrin Nipper, [place], ACSM RCEP, Exercise Physiologist    Supervising physician immediately available to respond to emergencies  [name] immediately available ER MD    Physician(s)  Dr. Jimmye Norman and Archie Balboa    Medication changes reported      No    Fall or balance concerns reported     No    Warm-up and Cool-down  Performed as group-led instruction    Resistance Training Performed  Yes    VAD Patient?  No      Pain Assessment   Currently in Pain?  No/denies          Social History   Tobacco Use  Smoking Status Former Smoker  . Packs/day: 1.00  . Years: 40.00  . Pack years: 40.00  . Types: Cigarettes  . Last attempt to quit: 09/15/2007  . Years since quitting: 10.1  Smokeless Tobacco Never Used    Goals Met:  Proper associated with RPD/PD & O2 Sat Independence with exercise equipment Using PLB without cueing & demonstrates good technique Exercise tolerated well Strength training completed today  Goals Unmet:  Not Applicable  Comments: Pt able to follow exercise prescription today without complaint.  Will continue to monitor for progression.    Dr. Emily Filbert is Medical Director for [place] and [name].

## 2017-11-02 ENCOUNTER — Encounter: Payer: Medicare Other | Admitting: *Deleted

## 2017-11-02 DIAGNOSIS — J432 Centrilobular emphysema: Secondary | ICD-10-CM | POA: Diagnosis not present

## 2017-11-02 NOTE — Progress Notes (Signed)
Daily Session Note  Patient Details  Name: [name] MRN: [id number] Date of Birth: 1946/07/23 Referring Provider:     Pulmonary Rehab from 09/17/2017 in [facility] Cardiac and Pulmonary Rehab  Referring Provider  Simonne Maffucci MD      Encounter Date: 11/02/2017  Check In: Session Check In - 11/02/17 1134      Check-In   Location  [facility]-Cardiac & Pulmonary Rehab    Staff Present  Renita Papa, RN BSN;Owin Vignola Luan Pulling, MA, ACSM RCEP, Exercise Physiologist;Other Joellyn Rued, BS, ACSM CEP    Supervising physician immediately available to respond to emergencies  [name] immediately available ER MD    Physician(s)  Drs. Malinda and Kinner    Medication changes reported      No    Fall or balance concerns reported     No    Warm-up and Cool-down  Performed as group-led Higher education careers adviser Performed  Yes    VAD Patient?  No      Pain Assessment   Currently in Pain?  No/denies          Social History   Tobacco Use  Smoking Status Former Smoker  . Packs/day: 1.00  . Years: 40.00  . Pack years: 40.00  . Types: Cigarettes  . Last attempt to quit: 09/15/2007  . Years since quitting: 10.1  Smokeless Tobacco Never Used    Goals Met:  Independence with exercise equipment Exercise tolerated well No report of cardiac concerns or symptoms Strength training completed today  Goals Unmet:  Not Applicable  Comments: Pt able to follow exercise prescription today without complaint.  Will continue to monitor for progression.    Dr. Emily Filbert is Medical Director for [place] and [name].

## 2017-11-08 ENCOUNTER — Ambulatory Visit (INDEPENDENT_AMBULATORY_CARE_PROVIDER_SITE_OTHER): Payer: Medicare Other | Admitting: Pharmacist

## 2017-11-08 DIAGNOSIS — Z5181 Encounter for therapeutic drug level monitoring: Secondary | ICD-10-CM | POA: Diagnosis not present

## 2017-11-08 DIAGNOSIS — I480 Paroxysmal atrial fibrillation: Secondary | ICD-10-CM | POA: Diagnosis not present

## 2017-11-08 LAB — POCT INR: INR: 2.2

## 2017-11-09 DIAGNOSIS — S82402A Unspecified fracture of shaft of left fibula, initial encounter for closed fracture: Secondary | ICD-10-CM | POA: Diagnosis not present

## 2017-11-09 DIAGNOSIS — J432 Centrilobular emphysema: Secondary | ICD-10-CM | POA: Diagnosis not present

## 2017-11-09 NOTE — Progress Notes (Signed)
Daily Session Note  Patient Details  Name: MAUDE HETTICH MRN: [id number] Date of Birth: [birth year] Referring Provider:     Pulmonary Rehab from 09/17/2017 in [facility] Cardiac and Pulmonary Rehab  Referring Provider  Simonne Maffucci MD      Encounter Date: 11/09/2017  Check In: Session Check In - 11/09/17 1139      Check-In   Location  [facility]-Cardiac & Pulmonary Rehab    Staff Present  Justin Mend Lorre Nick, [place], ACSM RCEP, Exercise Physiologist;Meredith Sherryll Burger, RN BSN    Supervising physician immediately available to respond to emergencies  [name] immediately available ER MD    Physician(s)  Dr. Burlene Arnt and Cinda Quest    Medication changes reported      No    Fall or balance concerns reported     No    Warm-up and Cool-down  Performed as group-led instruction    Resistance Training Performed  Yes    VAD Patient?  No      Pain Assessment   Currently in Pain?  No/denies          Social History   Tobacco Use  Smoking Status Former Smoker  . Packs/day: 1.00  . Years: 40.00  . Pack years: 40.00  . Types: Cigarettes  . Last attempt to quit: 09/15/2007  . Years since quitting: 10.1  Smokeless Tobacco Never Used    Goals Met:  Independence with exercise equipment Exercise tolerated well No report of cardiac concerns or symptoms Strength training completed today  Goals Unmet:  Not Applicable  Comments: Pt able to follow exercise prescription today without complaint.  Will continue to monitor for progression.   Dr. Emily Filbert is Medical Director for [place] and [name].

## 2017-11-14 ENCOUNTER — Telehealth: Payer: Self-pay | Admitting: *Deleted

## 2017-11-14 ENCOUNTER — Encounter: Payer: Medicare Other | Attending: Pulmonary Disease | Admitting: *Deleted

## 2017-11-14 DIAGNOSIS — J432 Centrilobular emphysema: Secondary | ICD-10-CM | POA: Insufficient documentation

## 2017-11-14 DIAGNOSIS — S93492A Sprain of other ligament of left ankle, initial encounter: Secondary | ICD-10-CM | POA: Diagnosis not present

## 2017-11-14 DIAGNOSIS — M79672 Pain in left foot: Secondary | ICD-10-CM | POA: Diagnosis not present

## 2017-11-14 NOTE — Telephone Encounter (Signed)
Called [name] about her ortho appointment tomorrow regarding her ankle. I reeducated why she can't wear her current boot while exercising during [name] and to ask the ortho doctor if there are other alternatives she can wear so she can keep coming to exercise with [place]. She is going to ask for a possible different option as she really wants to keep coming since she is progressing so well.

## 2017-11-14 NOTE — Progress Notes (Deleted)
Daily Session Note  Patient Details  Name: [name] MRN: [id number] Date of Birth: 1946/03/26 Referring Provider:     Pulmonary Rehab from 09/17/2017 in [facility] Cardiac and Pulmonary Rehab  Referring Provider  Simonne Maffucci MD      Encounter Date: 11/14/2017  Check In: Session Check In - 11/14/17 1131      Check-In   Location  [facility]-Cardiac & Pulmonary Rehab    Staff Present  Renita Papa, RN BSN;Joseph Darrin Nipper, [place], ACSM RCEP, Exercise Physiologist    Supervising physician immediately available to respond to emergencies  [name] immediately available ER MD    Physician(s)  Dr. Cinda Quest and Joni Fears    Medication changes reported      No    Fall or balance concerns reported     No    Warm-up and Cool-down  Performed as group-led instruction    Resistance Training Performed  Yes    VAD Patient?  No      Pain Assessment   Currently in Pain?  No/denies          Social History   Tobacco Use  Smoking Status Former Smoker  . Packs/day: 1.00  . Years: 40.00  . Pack years: 40.00  . Types: Cigarettes  . Last attempt to quit: 09/15/2007  . Years since quitting: 10.1  Smokeless Tobacco Never Used    Goals Met:  Proper associated with RPD/PD & O2 Sat Independence with exercise equipment Using PLB without cueing & demonstrates good technique Exercise tolerated well Strength training completed today  Goals Unmet:  Not Applicable  Comments: Pt able to follow exercise prescription today without complaint.  Will continue to monitor for progression.    Dr. Emily Filbert is Medical Director for [place] and [name].

## 2017-11-14 NOTE — Progress Notes (Signed)
Incomplete Session Note  Patient Details  Name: [name] MRN: [id number] Date of Birth: [birth year] Referring Provider:     Pulmonary Rehab from 09/17/2017 in [facility] Cardiac and Pulmonary Rehab  Referring Provider  Simonne Maffucci MD      [name] did not complete her rehab session.  She recently hurt her ankle and is now in a boot. She has an ortho appointment tomorrow. She is not able to exercise today.

## 2017-11-15 ENCOUNTER — Telehealth: Payer: Self-pay

## 2017-11-15 NOTE — Telephone Encounter (Signed)
[name] called to inform us that she will be attending class tomorrow after talking to the doctor about her boot. She is going to wear a brace for exercise that was cleared by her doctor.

## 2017-11-16 ENCOUNTER — Encounter: Payer: Medicare Other | Admitting: *Deleted

## 2017-11-16 DIAGNOSIS — J432 Centrilobular emphysema: Secondary | ICD-10-CM

## 2017-11-16 NOTE — Progress Notes (Signed)
Daily Session Note  Patient Details  Name: KYIA RHUDE MRN: [id number] Date of Birth: Jul 14, 1946 Referring Provider:     Pulmonary Rehab from 09/17/2017 in [facility] Cardiac and Pulmonary Rehab  Referring Provider  Simonne Maffucci MD      Encounter Date: 11/16/2017  Check In: Session Check In - 11/16/17 1122      Check-In   Location  [facility]-Cardiac & Pulmonary Rehab    Staff Present  Renita Papa, RN BSN;Jessica Luan Pulling, MA, ACSM RCEP, Exercise Physiologist;Joseph Flavia Shipper    Supervising physician immediately available to respond to emergencies  [name] immediately available ER MD    Physician(s)  Dr. Corky Downs and Clearnce Hasten    Medication changes reported      No    Fall or balance concerns reported     No    Warm-up and Cool-down  Performed as group-led instruction    Resistance Training Performed  Yes    VAD Patient?  No      Pain Assessment   Currently in Pain?  No/denies          Social History   Tobacco Use  Smoking Status Former Smoker  . Packs/day: 1.00  . Years: 40.00  . Pack years: 40.00  . Types: Cigarettes  . Last attempt to quit: 09/15/2007  . Years since quitting: 10.1  Smokeless Tobacco Never Used    Goals Met:  Proper associated with RPD/PD & O2 Sat Independence with exercise equipment Using PLB without cueing & demonstrates good technique Exercise tolerated well Strength training completed today  Goals Unmet:  Not Applicable  Comments: Pt able to follow exercise prescription today without complaint.  Will continue to monitor for progression.    Dr. Emily Filbert is Medical Director for [place] and [name].

## 2017-11-19 DIAGNOSIS — J432 Centrilobular emphysema: Secondary | ICD-10-CM | POA: Diagnosis not present

## 2017-11-19 NOTE — Progress Notes (Signed)
Daily Session Note  Patient Details  Name: [name] MRN: [id number] Date of Birth: 1945/12/14 Referring Provider:     Pulmonary Rehab from 09/17/2017 in [facility] Cardiac and Pulmonary Rehab  Referring Provider  Simonne Maffucci MD      Encounter Date: 11/19/2017  Check In: Session Check In - 11/19/17 1139      Check-In   Location  [facility]-Cardiac & Pulmonary Rehab    Staff Present  Justin Mend RCP,RRT,BSRT;Amanda Oletta Darter, BA, ACSM CEP, Exercise Physiologist;Kelly Amedeo Plenty, BS, ACSM CEP, Exercise Physiologist    Supervising physician immediately available to respond to emergencies  [name] immediately available ER MD    Physician(s)  Dr. Kerman Passey and Jimmye Norman    Medication changes reported      No    Fall or balance concerns reported     No    Warm-up and Cool-down  Performed as group-led instruction    Resistance Training Performed  Yes    VAD Patient?  No      Pain Assessment   Currently in Pain?  No/denies          Social History   Tobacco Use  Smoking Status Former Smoker  . Packs/day: 1.00  . Years: 40.00  . Pack years: 40.00  . Types: Cigarettes  . Last attempt to quit: 09/15/2007  . Years since quitting: 10.1  Smokeless Tobacco Never Used    Goals Met:  Independence with exercise equipment Exercise tolerated well No report of cardiac concerns or symptoms Strength training completed today  Goals Unmet:  Not Applicable  Comments: Pt able to follow exercise prescription today without complaint.  Will continue to monitor for progression.   Dr. Emily Filbert is Medical Director for [place] and [name].

## 2017-11-21 DIAGNOSIS — J432 Centrilobular emphysema: Secondary | ICD-10-CM | POA: Diagnosis not present

## 2017-11-21 NOTE — Progress Notes (Signed)
Daily Session Note  Patient Details  Name: [name] MRN: [id number] Date of Birth: 03/16/1946 Referring Provider:     Pulmonary Rehab from 09/17/2017 in [facility] and Pulmonary Rehab  Referring Provider  Simonne Maffucci MD      Encounter Date: 11/21/2017  Check In: Session Check In - 11/21/17 1107      Check-In   Location  [facility]Cardiac & Pulmonary Re[facility]    Staff Present  Justin Mend Lorre Nick, [place], ACSM RCEP, Exercise Physiologist;Meredith Sherryll Burger, RN BSN    Supervising physician immediately available to respond to emergencies  [name] immediately available ER MD    Physician(s)  Dr. Quentin Cornwall and Jimmye Norman    Medication changes reported      No    Fall or balance concerns reported     No    Warm-up and Cool-down  Performed as group-led instruction    Resistance Training Performed  Yes    VAD Patient?  No      VAD patient   Has back up controller?  No      Pain Assessment   Currently in Pain?  No/denies          Social History   Tobacco Use  Smoking Status Former Smoker  . Packs/day: 1.00  . Years: 40.00  . Pack years: 40.00  . Types: Cigarettes  . Last attempt to quit: 09/15/2007  . Years since quitting: 10.1  Smokeless Tobacco Never Used    Goals Met:  Independence with exercise equipment Exercise tolerated well No report of cardiac concerns or symptoms Strength training completed today  Goals Unmet:  Not Applicable  Comments: Pt able to follow exercise prescription today without complaint.  Will continue to monitor for progression.   Dr. Emily Filbert is Medical Director for [place] and [name].

## 2017-11-23 DIAGNOSIS — J432 Centrilobular emphysema: Secondary | ICD-10-CM

## 2017-11-23 NOTE — Progress Notes (Signed)
Daily Session Note  Patient Details  Name: [name] MRN: [id number] Date of Birth: 1946/11/07 Referring Provider:     Pulmonary Rehab from 09/17/2017 in [facility] Cardiac and Pulmonary Rehab  Referring Provider  Simonne Maffucci MD      Encounter Date: 11/23/2017  Check In: Session Check In - 11/23/17 1126      Check-In   Location  [facility]-Cardiac & Pulmonary Rehab    Staff Present  Justin Mend Lorre Nick, [place], ACSM RCEP, Exercise Physiologist;Meredith Sherryll Burger, RN BSN    Supervising physician immediately available to respond to emergencies  [name] immediately available ER MD    Physician(s)  Dr. Alfred Levins and Corky Downs    Medication changes reported      No    Fall or balance concerns reported     No    Warm-up and Cool-down  Performed as group-led instruction    Resistance Training Performed  Yes    VAD Patient?  No      VAD patient   Has back up controller?  No      Pain Assessment   Currently in Pain?  No/denies          Social History   Tobacco Use  Smoking Status Former Smoker  . Packs/day: 1.00  . Years: 40.00  . Pack years: 40.00  . Types: Cigarettes  . Last attempt to quit: 09/15/2007  . Years since quitting: 10.1  Smokeless Tobacco Never Used    Goals Met:  Independence with exercise equipment Exercise tolerated well No report of cardiac concerns or symptoms Strength training completed today  Goals Unmet:  Not Applicable  Comments: Pt able to follow exercise prescription today without complaint.  Will continue to monitor for progression.   Dr. Emily Filbert is Medical Director for [name] and [name].

## 2017-11-26 DIAGNOSIS — J432 Centrilobular emphysema: Secondary | ICD-10-CM | POA: Diagnosis not present

## 2017-11-26 NOTE — Progress Notes (Signed)
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 indoor options.  Pt voiced understanding.  [name] has been doing well in rehab.  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  She has been out since 11/26 but hopes to return tomorrow.  She is planning to increase her speed on the treadmill at her next visit.  We will continue to monitor her progress.   [name] is doing well in rehab.  She is now up to 2.5 mph on the treadmill.  We will continue to monitor her progression.    Expected Outcomes  Short: Use RPE daily to regulate intensity.  Long: Follow program prescription in THR.  Short: Move up workloads.  Long: Continue to increase exercise at home.   Short: Start increasing time to 30 min by adding in one minute each time she goes.  Long: Continue to exercise independently  Short: Return to rehab and be able to pick back up to workloads again.  Long: Continue to work on increasing exercise time at home.   Short: Increase workloads on NuStep and XR.  Long: Continue to exercise at home.    [name] Name 11/14/17 1408              Exercise Goal Re-Evaluation   Exercise Goals Review  Increase Physical Activity;Increase Strength and Stamina;Understanding of Exercise Prescription       Comments  Kirsti continues to do well in rehab.  Today, she came in wearing a boot as her ankle had still been hurting. She has an appointment with an orthopedic doctor tomorrow to discuss her injury and exercise going forward. We will wait to see what the doctor advises tomorrow.        Expected Outcomes  Short: If able to exercise, continue to work on increasing workloads.  Long:  Continue to exercise at home on off days.          Discharge Exercise Prescription (Final Exercise Prescription Changes): Exercise Prescription Changes - 11/14/17 1400      Response to Exercise   Blood Pressure (Admit)  126/62    Blood Pressure (Exit)  128/72    Heart Rate (Admit)  71 bpm    Heart Rate (Exercise)  90 bpm    Heart Rate (Exit)  71 bpm    Oxygen Saturation (Admit)  97 %    Oxygen Saturation (Exercise)  91 %    Oxygen Saturation (Exit)  99 %    Rating of Perceived Exertion (Exercise)  12    Perceived Dyspnea (Exercise)  2    Symptoms  none    Comments       Duration  Continue with 45 min of aerobic exercise without signs/symptoms of physical distress.    Intensity  THRR unchanged      Progression   Progression  Continue to progress workloads to maintain intensity without signs/symptoms of physical distress.    Average METs  3.35      Resistance Training   Training Prescription  Yes    Weight  3 lbs    Reps  10-15      Interval Training   Interval Training  No      Treadmill   MPH  2.7    Grade  0.5    Minutes  15    METs  3.25      NuStep   Level  2    SPM  99    Minutes  15    METs  3      REL-XR   Level  2    Speed  66    Minutes  15    METs  3.8      Home Exercise Plan   Plans to continue exercise at  Home (comment) walking at track and mall    Frequency  Add 3 additional days to program exercise  sessions. continue to walk    Initial Home Exercises Provided  10/03/17       Nutrition:  Target Goals: Understanding of nutrition guidelines, daily intake of sodium '1500mg'$ , cholesterol '200mg'$ , calories 30% from fat and 7% or less from saturated fats, daily to have 5 or more servings of fruits and vegetables.  Biometrics: Pre Biometrics - 09/17/17 1611      Pre Biometrics   Height  5' 3.6" (1.615 m)    Weight  148 lb 8 oz (67.4 kg)    Waist Circumference  32.25 inches    Hip Circumference  41.25 inches    Waist to Hip Ratio  0.78 %    BMI (Calculated)  25.83        Nutrition Therapy Plan and Nutrition Goals: Nutrition Therapy & Goals - 10/19/17 1209      Nutrition Therapy   RD appointment defered  Yes       Nutrition Discharge: Rate Your Plate Scores: Nutrition Assessments - 09/17/17 1451      MEDFICTS Scores   Pre Score  17       Nutrition Goals Re-Evaluation: Nutrition Goals Re-Evaluation    Row Name 10/19/17 1210 11/09/17 1154           Goals   Current Weight  150 lb (68 kg)  147 lb 1.6 oz (66.7 kg)      Nutrition Goal  Eat a heart healthy diet.  Continue to eat a heart healthy, low sodium diet      Comment  She has deferred the dietician appointment at this time. When she had her heart attack she has changed her eating habits 10 years ago.  She came to the nutrition classes but does not want to meet with a dietician one on one at this time. She feels comfortable reading food labels and understanding what is good for her diagnosis       Expected Outcome  Short: lose a few pounds. Long: eat healthy and exercise to maintain weight loss  Short: continue to lose a few pounds to get to goal wait of 140lb. Long: continue to eat healthy and understand diet recommendations for diagnosis          Nutrition Goals Discharge (Final Nutrition Goals Re-Evaluation): Nutrition Goals Re-Evaluation - 11/09/17 1154      Goals   Current Weight  147 lb 1.6 oz (66.7 kg)     Nutrition Goal  Continue to eat a heart healthy, low sodium diet    Comment  She came to the nutrition classes but does not want to meet with a dietician one on one at this time. She feels comfortable reading food labels and understanding what is good for her diagnosis     Expected Outcome  Short: continue to lose a few pounds to get to goal wait of 140lb. Long: continue to eat healthy and understand diet recommendations for diagnosis        Psychosocial: Target Goals: Acknowledge presence or absence of significant depression and/or stress, maximize coping skills, provide positive support system. Participant is able to verbalize types and ability to use techniques and skills needed for reducing stress and depression.   Initial Review & Psychosocial Screening: Initial Psych Review & Screening - 09/17/17 1502      Initial Review   Current issues with  None Identified      Family Dynamics   Good Support System?  Yes    Comments  her two kids and grandchildren are great for support      Barriers   Psychosocial barriers to participate in program  There are no identifiable barriers or psychosocial needs.;The patient should benefit from training in stress management and relaxation.      Screening Interventions   Interventions  Encouraged to exercise;Program counselor consult;Provide feedback about the scores to participant;To provide support and resources with identified psychosocial needs;Yes    Expected Outcomes  Short Term goal: Utilizing psychosocial counselor, staff and physician to assist with identification of specific Stressors or current issues interfering with healing process. Setting desired goal for each stressor or current issue identified.;Long Term Goal: Stressors or current issues are controlled or eliminated.;Short Term goal: Identification and review with participant of any Quality of Life or Depression concerns found by scoring the questionnaire.;Long Term goal: The participant  improves quality of Life and PHQ9 Scores as seen by post scores and/or verbalization of changes       Quality of Life Scores:   PHQ-9: Recent Review Flowsheet Data    Depression screen [facility] 2/9 09/17/2017   Decreased Interest 0   Down, Depressed, Hopeless 0   PHQ - 2 Score 0   Altered sleeping 0   Tired, decreased energy 1   Change in appetite 0   Feeling bad or failure about yourself  0   Trouble concentrating 1   Moving slowly or fidgety/restless 0   Suicidal thoughts 0   PHQ-9 Score 2  Difficult doing work/chores Not difficult at all     Interpretation of Total Score  Total Score Depression Severity:  1-4 = Minimal depression, 5-9 = Mild depression, 10-14 = Moderate depression, 15-19 = Moderately severe depression, 20-27 = Severe depression   Psychosocial Evaluation and Intervention: Psychosocial Evaluation - 10/29/17 1251      Psychosocial Evaluation & Interventions   Interventions  Encouraged to exercise with the program and follow exercise prescription;Relaxation education;Stress management education    Comments  Counselor met with Ms. [name]) today for initial psychosocial evaluation.  She is a 72 year old who had lower left lob lumpectomy for lung cancer this past August.  She has a strong support system with a daughter and grandchildren close by and active involvement in her local church.  Eleora has had multiple health issues over the past 10 years with CABGx3 in 2008; a ruptured colon in 2015; and subsequent hernea repairs after that.  She sleeps well and has a good appetite.  English denies a history of depression or anxiety and states her mood is generally  positive most of the time.  Bryton says her health is her primary stressor.  She has goals to learn to cope with her SOB and to increase her stamina and strength while in this program.  She also would like to get back to walking 2 miles a day as she was prior to her last Surgery in August.      Expected  Outcomes  Annjeanette will benefit from consistent exercise to achieve her stated goals.  The educational and psychoeducational components will be helpful in learning more about her condition and coping more positively.      Continue Psychosocial Services   Follow up required by staff       Psychosocial Re-Evaluation: Psychosocial Re-Evaluation    [place] Name 10/19/17 1213 11/09/17 1157           Psychosocial Re-Evaluation   Current issues with  Current Stress Concerns  Current Stress Concerns      Comments  She gets stressed out when she is short of breath. She has an ankle injury that has slowed her down recently but is working around it. [name] is happy in general. She has projects to work on that she is behind on due to her surgeries in the past .  She reports that her stress level is down even more since staff last checked in, even in spite of the holidays. She is looking forward to a new year with hopefully minimal health issues.       Expected Outcomes  Short: continue to exercise in [name]. Long: Maintain an exercise routine to keep stress minimized.  Short: continue to exercise in [name]. Long: Maintain an exercise routine and apply lessons from class on deep breathing and destressing.       Interventions  Encouraged to attend Pulmonary Rehabilitation for the exercise  Encouraged to attend Pulmonary Rehabilitation for the exercise      Continue Psychosocial Services   Follow up required by staff  Follow up required by staff         Psychosocial Discharge (Final Psychosocial Re-Evaluation): Psychosocial Re-Evaluation - 11/09/17 1157      Psychosocial Re-Evaluation   Current issues with  Current Stress Concerns    Comments  She reports that her stress level is down even more since staff last checked in, even in spite of the holidays. She is looking forward to a new year with hopefully minimal  health issues.     Expected Outcomes  Short: continue to exercise in [name]. Long:  Maintain an exercise routine and apply lessons from class on deep breathing and destressing.     Interventions  Encouraged to attend Pulmonary Rehabilitation for the exercise    Continue Psychosocial Services   Follow up required by staff       Education: Education Goals: Education classes will be provided on a weekly basis, covering required topics. Participant will state understanding/return demonstration of topics presented.  Learning Barriers/Preferences: Learning Barriers/Preferences - 09/17/17 1504      Learning Barriers/Preferences   Learning Barriers  Sight wears Glasses    Learning Preferences  None       Education Topics: Initial Evaluation Education: - Verbal, written and demonstration of respiratory meds, RPE/PD scales, oximetry and breathing techniques. Instruction on use of nebulizers and MDIs: cleaning and proper use, rinsing mouth with steroid doses and importance of monitoring MDI activations.   Pulmonary Rehab from 11/21/2017 in [facility] Cardiac and Pulmonary Rehab  Date  09/17/17  Educator  [facility]  Instruction Review Code  1- Verbalizes Understanding      General Nutrition Guidelines/Fats and Fiber: -Group instruction provided by verbal, written material, models and posters to present the general guidelines for heart healthy nutrition. Gives an explanation and review of dietary fats and fiber.   Controlling Sodium/Reading Food Labels: -Group verbal and written material supporting the discussion of sodium use in heart healthy nutrition. Review and explanation with models, verbal and written materials for utilization of the food label.   Pulmonary Rehab from 11/21/2017 in [facility] Cardiac and Pulmonary Rehab  Date  10/29/17  Educator  CR  Instruction Review Code  1- Verbalizes Understanding      Exercise Physiology & Risk Factors: - Group verbal and written instruction with models to review the exercise physiology of the cardiovascular system and associated critical values.  Details cardiovascular disease risk factors and the goals associated with each risk factor.   Pulmonary Rehab from 11/21/2017 in [facility] Cardiac and Pulmonary Rehab  Date  11/16/17  Educator  [facility]  Instruction Review Code  1- Verbalizes Understanding      Aerobic Exercise & Resistance Training: - Gives group verbal and written discussion on the health impact of inactivity. On the components of aerobic and resistive training programs and the benefits of this training and how to safely progress through these programs.   Flexibility, Balance, General Exercise Guidelines: - Provides group verbal and written instruction on the benefits of flexibility and balance training programs. Provides general exercise guidelines with specific guidelines to those with heart or lung disease. Demonstration and skill practice provided.   Pulmonary Rehab from 11/21/2017 in [facility] Cardiac and Pulmonary Rehab  Date  10/03/17  Educator  AS  Instruction Review Code  2- Demonstrated Understanding      Stress Management: - Provides group verbal and written instruction about the health risks of elevated stress, cause of high stress, and healthy ways to reduce stress.   Depression: - Provides group verbal and written instruction on the correlation between heart/lung disease and depressed mood, treatment options, and the stigmas associated with seeking treatment.   Exercise & Equipment Safety: - Individual verbal instruction and demonstration of equipment use and safety with use of the equipment.   Pulmonary Rehab from 11/21/2017 in [facility] Cardiac and Pulmonary Rehab  Date  09/17/17  Educator  [facility]  Instruction Review Code  1- Verbalizes Understanding      Infection Prevention: -  Provides verbal and written material to individual with discussion of infection control including proper hand washing and proper equipment cleaning during exercise session.   Pulmonary Rehab from 11/21/2017 in [facility] Cardiac and Pulmonary Rehab  Date   09/17/17  Educator  [facility]  Instruction Review Code  1- Verbalizes Understanding      Falls Prevention: - Provides verbal and written material to individual with discussion of falls prevention and safety.   Pulmonary Rehab from 11/21/2017 in [facility] Cardiac and Pulmonary Rehab  Date  09/17/17  Educator  [facility]  Instruction Review Code  1- Verbalizes Understanding      Diabetes: - Individual verbal and written instruction to review signs/symptoms of diabetes, desired ranges of glucose level fasting, after meals and with exercise. Advice that pre and post exercise glucose checks will be done for 3 sessions at entry of program.   Chronic Lung Diseases: - Group verbal and written instruction to review new updates, new respiratory medications, new advancements in procedures and treatments. Provide informative websites and "800" numbers of self-education.   Pulmonary Rehab from 11/21/2017 in [facility] Cardiac and Pulmonary Rehab  Date  11/14/17  Educator  [facility]  Instruction Review Code  1- Verbalizes Understanding      Lung Procedures: - Group verbal and written instruction to describe testing methods done to diagnose lung disease. Review the outcome of test results. Describe the treatment choices: Pulmonary Function Tests, ABGs and oximetry.   Energy Conservation: - Provide group verbal and written instruction for methods to conserve energy, plan and organize activities. Instruct on pacing techniques, use of adaptive equipment and posture/positioning to relieve shortness of breath.   Triggers: - Group verbal and written instruction to review types of environmental controls: home humidity, furnaces, filters, dust mite/pet prevention, HEPA vacuums. To discuss weather changes, air quality and the benefits of nasal washing.   Exacerbations: - Group verbal and written instruction to provide: warning signs, infection symptoms, calling MD promptly, preventive modes, and value of vaccinations. Review: effective  airway clearance, coughing and/or vibration techniques. Create an Sports administrator.   Oxygen: - Individual and group verbal and written instruction on oxygen therapy. Includes supplement oxygen, available portable oxygen systems, continuous and intermittent flow rates, oxygen safety, concentrators, and Medicare reimbursement for oxygen.   Pulmonary Rehab from 11/21/2017 in [facility] Cardiac and Pulmonary Rehab  Date  09/17/17  Educator  [facility]  Instruction Review Code  1- Verbalizes Understanding      Respiratory Medications: - Group verbal and written instruction to review medications for lung disease. Drug class, frequency, complications, importance of spacers, rinsing mouth after steroid MDI's, and proper cleaning methods for nebulizers.   Pulmonary Rehab from 11/21/2017 in [facility] Cardiac and Pulmonary Rehab  Date  09/17/17  Educator  [facility]  Instruction Review Code  1- Verbalizes Understanding      AED/CPR: - Group verbal and written instruction with the use of models to demonstrate the basic use of the AED with the basic ABC's of resuscitation.   Pulmonary Rehab from 11/21/2017 in [facility] Cardiac and Pulmonary Rehab  Date  11/02/17  Educator  [facility]  Instruction Review Code  1- Verbalizes Understanding      Breathing Retraining: - Provides individuals verbal and written instruction on purpose, frequency, and proper technique of diaphragmatic breathing and pursed-lipped breathing. Applies individual practice skills.   Pulmonary Rehab from 11/21/2017 in [facility] Cardiac and Pulmonary Rehab  Date  09/17/17  Educator  [facility]  Instruction Review Code  1- Verbalizes Understanding  Anatomy and Physiology of the Lungs: - Group verbal and written instruction with the use of models to provide basic lung anatomy and physiology related to function, structure and complications of lung disease.   Anatomy & Physiology of the Heart: - Group verbal and written instruction and models provide basic cardiac anatomy and  physiology, with the coronary electrical and arterial systems. Review of: AMI, Angina, Valve disease, Heart Failure, Cardiac Arrhythmia, Pacemakers, and the ICD.   Pulmonary Rehab from 11/21/2017 in [facility] Cardiac and Pulmonary Rehab  Date  09/28/17  Educator  [place]  Instruction Review Code  1- Verbalizes Understanding      Heart Failure: - Group verbal and written instruction on the basics of heart failure: signs/symptoms, treatments, explanation of ejection fraction, enlarged heart and cardiomyopathy.   Pulmonary Rehab from 11/21/2017 in [facility] Cardiac and Pulmonary Rehab  Date  09/28/17  Educator  [place]  Instruction Review Code  1- Verbalizes Understanding      Sleep Apnea: - Individual verbal and written instruction to review Obstructive Sleep Apnea. Review of risk factors, methods for diagnosing and types of masks and machines for OSA.   Anxiety: - Provides group, verbal and written instruction on the correlation between heart/lung disease and anxiety, treatment options, and management of anxiety.   Relaxation: - Provides group, verbal and written instruction about the benefits of relaxation for patients with heart/lung disease. Also provides patients with examples of relaxation techniques.   Cardiac Medications: - Group verbal and written instruction to review commonly prescribed medications for heart disease. Reviews the medication, class of the drug, and side effects.   Pulmonary Rehab from 11/21/2017 in [facility] Cardiac and Pulmonary Rehab  Date  10/19/17  Educator  [place]  Instruction Review Code  1- Verbalizes Understanding      Know Your Numbers: -Group verbal and written instruction about important numbers in your health.  Review of Cholesterol, Blood Pressure, Diabetes, and BMI and the role they play in your overall health.   Pulmonary Rehab from 11/21/2017 in [facility] Cardiac and Pulmonary Rehab  Date  11/21/17  Educator  [facility]  Instruction Review Code  1- Verbalizes Understanding       Other: -Provides group and verbal instruction on various topics (see comments)    Knowledge Questionnaire Score: Knowledge Questionnaire Score - 09/17/17 1537      Knowledge Questionnaire Score   Pre Score  17/18 Reviewed with patient        Core Components/Risk Factors/Patient Goals at Admission: Personal Goals and Risk Factors at Admission - 09/17/17 1507      Core Components/Risk Factors/Patient Goals on Admission    Weight Management  Yes;Weight Loss    Intervention  Weight Management: Develop a combined nutrition and exercise program designed to reach desired caloric intake, while maintaining appropriate intake of nutrient and fiber, sodium and fats, and appropriate energy expenditure required for the weight goal.;Weight Management: Provide education and appropriate resources to help participant work on and attain dietary goals.;Weight Management/Obesity: Establish reasonable short term and long term weight goals.    Admit Weight  148 lb 8 oz (67.4 kg)    Goal Weight: Short Term  141 lb (64 kg)    Goal Weight: Long Term  136 lb (61.7 kg)    Expected Outcomes  Short Term: Continue to assess and modify interventions until short term weight is achieved;Long Term: Adherence to nutrition and physical activity/exercise program aimed toward attainment of established weight goal;Weight Maintenance: Understanding of the daily nutrition guidelines, which  includes 25-35% calories from fat, 7% or less cal from saturated fats, less than '200mg'$  cholesterol, less than 1.5gm of sodium, & 5 or more servings of fruits and vegetables daily;Weight Loss: Understanding of general recommendations for a balanced deficit meal plan, which promotes 1-2 lb weight loss per week and includes a negative energy balance of [id number] kcal/d;Understanding recommendations for meals to include 15-35% energy as protein, 25-35% energy from fat, 35-60% energy from carbohydrates, less than '200mg'$  of dietary cholesterol, 20-35  gm of total fiber daily;Understanding of distribution of calorie intake throughout the day with the consumption of 4-5 meals/snacks    Improve shortness of breath with ADL's  Yes    Intervention  Provide education, individualized exercise plan and daily activity instruction to help decrease symptoms of SOB with activities of daily living.    Expected Outcomes  Short Term: Achieves a reduction of symptoms when performing activities of daily living.    Heart Failure  Yes    Intervention  Provide a combined exercise and nutrition program that is supplemented with education, support and counseling about heart failure. Directed toward relieving symptoms such as shortness of breath, decreased exercise tolerance, and extremity edema.    Expected Outcomes  Improve functional capacity of life;Short term: Attendance in program 2-3 days a week with increased exercise capacity. Reported lower sodium intake. Reported increased fruit and vegetable intake. Reports medication compliance.;Short term: Daily weights obtained and reported for increase. Utilizing diuretic protocols set by physician.;Long term: Adoption of self-care skills and reduction of barriers for early signs and symptoms recognition and intervention leading to self-care maintenance.    Hypertension  Yes    Intervention  Provide education on lifestyle modifcations including regular physical activity/exercise, weight management, moderate sodium restriction and increased consumption of fresh fruit, vegetables, and low fat dairy, alcohol moderation, and smoking cessation.;Monitor prescription use compliance.    Expected Outcomes  Long Term: Maintenance of blood pressure at goal levels.;Short Term: Continued assessment and intervention until BP is < 140/31m HG in hypertensive participants. < 130/874mHG in hypertensive participants with diabetes, heart failure or chronic kidney disease.    Lipids  Yes takes medication    Intervention  Provide education and  support for participant on nutrition & aerobic/resistive exercise along with prescribed medications to achieve LDL '70mg'$ , HDL >'40mg'$ .    Expected Outcomes  Long Term: Cholesterol controlled with medications as prescribed, with individualized exercise RX and with personalized nutrition plan. Value goals: LDL < '70mg'$ , HDL > 40 mg.;Short Term: Participant states understanding of desired cholesterol values and is compliant with medications prescribed. Participant is following exercise prescription and nutrition guidelines.       Core Components/Risk Factors/Patient Goals Review:  Goals and Risk Factor Review    Row Name 10/19/17 1217 11/09/17 1146           Core Components/Risk Factors/Patient Goals Review   Personal Goals Review  Improve shortness of breath with ADL's;Lipids;Hypertension;Heart Failure;Stress;Weight Management/Obesity  Improve shortness of breath with ADL's;Hypertension;Lipids;Heart Failure;Weight Management/Obesity;Stress      Review  Dilcia's Lipids back in October where within normal limits. She is taking her medication as directed for her cholesterol. Her blood pressure has improved slightly since the begining of the program. She feels like her strength has improved and has vacuumed recently and she said she did not get as short of breath.  BaStacias still taking her cholesterol medicine and eating healthier to improve numbers. Her blood pressure and heart rate are doing well. Her stamina and  strength have greatly improved since the beginning of the program. Stress level has improved. She has been taking B6 and limiting sodium to help her heart failure symptoms.       Expected Outcomes  Short: continue to attend [name] Regularly. Long: Improve blood pressure readings further.  Short: continue to attend rehab and education. Long: continue to improve SOB and stamina, take what she has learned and apply at home.          Core Components/Risk Factors/Patient Goals at Discharge  (Final Review):  Goals and Risk Factor Review - 11/09/17 1146      Core Components/Risk Factors/Patient Goals Review   Personal Goals Review  Improve shortness of breath with ADL's;Hypertension;Lipids;Heart Failure;Weight Management/Obesity;Stress    Review  Navy is still taking her cholesterol medicine and eating healthier to improve numbers. Her blood pressure and heart rate are doing well. Her stamina and strength have greatly improved since the beginning of the program. Stress level has improved. She has been taking B6 and limiting sodium to help her heart failure symptoms.     Expected Outcomes  Short: continue to attend rehab and education. Long: continue to improve SOB and stamina, take what she has learned and apply at home.        ITP Comments: ITP Comments    Row Name 09/17/17 1515 10/01/17 0849 10/29/17 0832 11/26/17 0832     ITP Comments  Medical Evaluation completed. Chart sent for review and changes to Dr. Emily Filbert Director of [place]. Diagnosis can be found in [facility] encounter 09/17/17  30 day review completed. ITP sent to Dr. Emily Filbert Director of [place]. Continue with ITP unless changes are made by physician.    30 day review completed. ITP sent to Dr. Emily Filbert Director of [place]. Continue with ITP unless changes are made by physician.    30 day review completed. ITP sent to Dr. Emily Filbert Director of [place]. Continue with ITP unless changes are made by physician.       Comments: 30 day review

## 2017-11-26 NOTE — Progress Notes (Signed)
Daily Session Note  Patient Details  Name: [name] MRN: [id number] Date of Birth: January 01, 1946 Referring Provider:     Pulmonary Rehab from 09/17/2017 in [facility] Cardiac and Pulmonary Rehab  Referring Provider  Simonne Maffucci MD      Encounter Date: 11/26/2017  Check In: Session Check In - 11/26/17 1123      Check-In   Location  [facility]-Cardiac & Pulmonary Rehab    Staff Present  Nada Maclachlan, BA, ACSM CEP, Exercise Physiologist;Tajay Muzzy Tedd Sias, BS, ACSM CEP, Exercise Physiologist    Supervising physician immediately available to respond to emergencies  [name] immediately available ER MD    Physician(s)  Dr. Clearnce Hasten and Reita Cliche    Medication changes reported      No    Fall or balance concerns reported     No    Warm-up and Cool-down  Performed as group-led instruction    Resistance Training Performed  Yes    VAD Patient?  No      VAD patient   Has back up controller?  No      Pain Assessment   Currently in Pain?  No/denies          Social History   Tobacco Use  Smoking Status Former Smoker  . Packs/day: 1.00  . Years: 40.00  . Pack years: 40.00  . Types: Cigarettes  . Last attempt to quit: 09/15/2007  . Years since quitting: 10.2  Smokeless Tobacco Never Used    Goals Met:  Independence with exercise equipment Exercise tolerated well No report of cardiac concerns or symptoms Strength training completed today  Goals Unmet:  Not Applicable  Comments: Pt able to follow exercise prescription today without complaint.  Will continue to monitor for progression.   Dr. Emily Filbert is Medical Director for [place] and [name].

## 2017-11-28 DIAGNOSIS — J432 Centrilobular emphysema: Secondary | ICD-10-CM

## 2017-11-28 NOTE — Progress Notes (Signed)
Daily Session Note  Patient Details  Name: [name] MRN: [id number] Date of Birth: 1945-12-28 Referring Provider:     Pulmonary Rehab from 09/17/2017 in [facility] Cardiac and Pulmonary Rehab  Referring Provider  Simonne Maffucci MD      Encounter Date: 11/28/2017  Check In: Session Check In - 11/28/17 1138      Check-In   Location  [facility]Card[facility] Pulmonary Re[facility]    Staff Present  Alberteen Sam, MA, RCEP, CCRP, Exercise Physiologist;Jin Capote Sherryll Burger, RN BSN;Joseph Flavia Shipper    Supervising physician immediately available to respond to emergencies  [name] immediately available ER MD    Physician(s)  Dr. Alfred Levins and Burlene Arnt    Medication changes reported      No    Fall or balance concerns reported     No    Warm-up and Cool-down  Performed as group-led instruction    Resistance Training Performed  Yes    VAD Patient?  No      Pain Assessment   Currently in Pain?  No/denies        Exercise Prescription Changes - 11/27/17 1600      Response to Exercise   Blood Pressure (Admit)  126/60    Blood Pressure (Exit)  132/64    Heart Rate (Admit)  59 bpm    Heart Rate (Exercise)  77 bpm    Heart Rate (Exit)  57 bpm    Oxygen Saturation (Admit)  99 %    Oxygen Saturation (Exercise)  97 %    Oxygen Saturation (Exit)  98 %    Rating of Perceived Exertion (Exercise)  15    Perceived Dyspnea (Exercise)  2    Symptoms  none    Duration  Continue with 45 min of aerobic exercise without signs/symptoms of physical distress.    Intensity  THRR unchanged      Progression   Progression  Continue to progress workloads to maintain intensity without signs/symptoms of physical distress.    Average METs  3.5      Resistance Training   Training Prescription  Yes    Weight  3 lbs    Reps  10-15      Interval Training   Interval Training  No      NuStep   Level  3    SPM  100    Minutes  15    METs  3.2      Arm Ergometer   Level  1    Minutes  15    METs  2.3      REL-XR   Level  3    Speed  60    Minutes  15    METs  5      Home Exercise Plan   Plans to continue exercise at  Home (comment) walking at track and mall    Frequency  Add 3 additional days to program exercise sessions. continue to walk    Initial Home Exercises Provided  10/03/17       Social History   Tobacco Use  Smoking Status Former Smoker  . Packs/day: 1.00  . Years: 40.00  . Pack years: 40.00  . Types: Cigarettes  . Last attempt to quit: 09/15/2007  . Years since quitting: 10.2  Smokeless Tobacco Never Used    Goals Met:  Proper associated with RPD/PD & O2 Sat Independence with exercise equipment Using PLB without cueing & demonstrates good technique Exercise tolerated well Strength training completed today  Goals Unmet:  Not Applicable  Comments: Pt able to follow exercise prescription today without complaint.  Will continue to monitor for progression.    Dr. Mark Miller is Medical Director for [facility] and [name]. 

## 2017-11-29 DIAGNOSIS — S93492A Sprain of other ligament of left ankle, initial encounter: Secondary | ICD-10-CM | POA: Diagnosis not present

## 2017-11-30 DIAGNOSIS — Z1231 Encounter for screening mammogram for malignant neoplasm of breast: Secondary | ICD-10-CM | POA: Diagnosis not present

## 2017-11-30 DIAGNOSIS — J432 Centrilobular emphysema: Secondary | ICD-10-CM

## 2017-11-30 NOTE — Progress Notes (Signed)
Daily Session Note  Patient Details  Name: [name] MRN: [id number] Date of Birth: [birth year] Referring Provider:     Pulmonary Rehab from 09/17/2017 in [facility] Cardiac and Pulmonary Rehab  Referring Provider  Simonne Maffucci MD      Encounter Date: 11/30/2017  Check In: Session Check In - 11/30/17 1005      Check-In   Location  [facility]Card[facility] & Pulmonary Rehab    Staff Present  Renita Papa, RN Vickki Hearing, BA, ACSM CEP, Exercise Physiologist;Kema Santaella Flavia Shipper    Supervising physician immediately available to respond to emergencies  [name] immediately available ER MD    Physician(s)  Dr. Jodell Cipro and [facility]    Medication changes reported      No    Fall or balance concerns reported     No    Warm-up and Cool-down  Performed as group-led instruction    Resistance Training Performed  Yes    VAD Patient?  No      Pain Assessment   Currently in Pain?  No/denies          Social History   Tobacco Use  Smoking Status Former Smoker  . Packs/day: 1.00  . Years: 40.00  . Pack years: 40.00  . Types: Cigarettes  . Last attempt to quit: 09/15/2007  . Years since quitting: 10.2  Smokeless Tobacco Never Used    Goals Met:  Independence with exercise equipment Exercise tolerated well No report of cardiac concerns or symptoms Strength training completed today  Goals Unmet:  Not Applicable  Comments: Pt able to follow exercise prescription today without complaint.  Will continue to monitor for progression.   Dr. Emily Filbert is Medical Director for [place] and [name].

## 2017-12-03 DIAGNOSIS — J432 Centrilobular emphysema: Secondary | ICD-10-CM | POA: Diagnosis not present

## 2017-12-03 NOTE — Progress Notes (Signed)
Daily Session Note  Patient Details  Name: [name] MRN: [id number] Date of Birth: 15-Jul-1946 Referring Provider:     Pulmonary Rehab from 09/17/2017 in [facility] Cardiac and Pulmonary Rehab  Referring Provider  Simonne Maffucci MD      Encounter Date: 12/03/2017  Check In: Session Check In - 12/03/17 1121      Check-In   Location  [facility] & Pulmonary Rehab    Staff Present  Nada Maclachlan, BA, ACSM CEP, Exercise Physiologist;Kelly Amedeo Plenty, BS, ACSM CEP, Exercise Physiologist;Mayce Noyes Flavia Shipper    Supervising physician immediately available to respond to emergencies  [name] immediately available ER MD    Physician(s)   Dr. Mable Paris and Cinda Quest    Medication changes reported      No    Fall or balance concerns reported     No    Warm-up and Cool-down  Performed as group-led instruction    Resistance Training Performed  Yes    VAD Patient?  No      Pain Assessment   Currently in Pain?  No/denies          Social History   Tobacco Use  Smoking Status Former Smoker  . Packs/day: 1.00  . Years: 40.00  . Pack years: 40.00  . Types: Cigarettes  . Last attempt to quit: 09/15/2007  . Years since quitting: 10.2  Smokeless Tobacco Never Used    Goals Met:  Independence with exercise equipment Exercise tolerated well No report of cardiac concerns or symptoms Strength training completed today  Goals Unmet:  Not Applicable  Comments: Pt able to follow exercise prescription today without complaint.  Will continue to monitor for progression.   Dr. Emily Filbert is Medical Director for [place] and [name].

## 2017-12-04 DIAGNOSIS — M25572 Pain in left ankle and joints of left foot: Secondary | ICD-10-CM | POA: Diagnosis not present

## 2017-12-04 DIAGNOSIS — R2689 Other abnormalities of gait and mobility: Secondary | ICD-10-CM | POA: Diagnosis not present

## 2017-12-05 DIAGNOSIS — J432 Centrilobular emphysema: Secondary | ICD-10-CM

## 2017-12-05 NOTE — Progress Notes (Signed)
Daily Session Note  Patient Details  Name: [name] MRN: [id number] Date of Birth: 06-20-1946 Referring Provider:     Pulmonary Rehab from 09/17/2017 in [facility] Cardiac and Pulmonary Rehab  Referring Provider  Simonne Maffucci MD      Encounter Date: 12/05/2017  Check In: Session Check In - 12/05/17 1113      Check-In   Location  [facility] Pulmonary Rehab    Staff Present  Justin Mend RCP,RRT,BSRT;Meredith Sherryll Burger, RN BSN;Jessica Luan Pulling, MA, RCEP, CCRP, Exercise Physiologist    Supervising physician immediately available to respond to emergencies  [name] immediately available ER MD    Physician(s)  Dr. Mariea Clonts and Jimmye Norman    Medication changes reported      No    Fall or balance concerns reported     No    Warm-up and Cool-down  Performed as group-led instruction    Resistance Training Performed  Yes    VAD Patient?  No      VAD patient   Has back up controller?  No      Pain Assessment   Currently in Pain?  No/denies          Social History   Tobacco Use  Smoking Status Former Smoker  . Packs/day: 1.00  . Years: 40.00  . Pack years: 40.00  . Types: Cigarettes  . Last attempt to quit: 09/15/2007  . Years since quitting: 10.2  Smokeless Tobacco Never Used    Goals Met:  Independence with exercise equipment Exercise tolerated well No report of cardiac concerns or symptoms Strength training completed today  Goals Unmet:  Not Applicable  Comments: Pt able to follow exercise prescription today without complaint.  Will continue to monitor for progression.   Dr. Emily Filbert is Medical Director for [place] and [name].

## 2017-12-06 ENCOUNTER — Ambulatory Visit: Payer: Medicare Other | Admitting: Pulmonary Disease

## 2017-12-06 DIAGNOSIS — M25572 Pain in left ankle and joints of left foot: Secondary | ICD-10-CM | POA: Diagnosis not present

## 2017-12-06 DIAGNOSIS — R609 Edema, unspecified: Secondary | ICD-10-CM | POA: Diagnosis not present

## 2017-12-06 DIAGNOSIS — R2689 Other abnormalities of gait and mobility: Secondary | ICD-10-CM | POA: Diagnosis not present

## 2017-12-07 ENCOUNTER — Ambulatory Visit (INDEPENDENT_AMBULATORY_CARE_PROVIDER_SITE_OTHER): Payer: Medicare Other | Admitting: Pulmonary Disease

## 2017-12-07 ENCOUNTER — Ambulatory Visit (INDEPENDENT_AMBULATORY_CARE_PROVIDER_SITE_OTHER): Payer: Medicare Other | Admitting: Pharmacist

## 2017-12-07 ENCOUNTER — Encounter: Payer: Self-pay | Admitting: Pulmonary Disease

## 2017-12-07 VITALS — BP 124/62 | HR 63 | Ht 63.0 in | Wt 150.0 lb

## 2017-12-07 DIAGNOSIS — R0602 Shortness of breath: Secondary | ICD-10-CM | POA: Diagnosis not present

## 2017-12-07 DIAGNOSIS — IMO0002 Reserved for concepts with insufficient information to code with codable children: Secondary | ICD-10-CM

## 2017-12-07 DIAGNOSIS — J432 Centrilobular emphysema: Secondary | ICD-10-CM

## 2017-12-07 DIAGNOSIS — C4492 Squamous cell carcinoma of skin, unspecified: Secondary | ICD-10-CM

## 2017-12-07 DIAGNOSIS — Z5181 Encounter for therapeutic drug level monitoring: Secondary | ICD-10-CM | POA: Diagnosis not present

## 2017-12-07 DIAGNOSIS — I480 Paroxysmal atrial fibrillation: Secondary | ICD-10-CM

## 2017-12-07 DIAGNOSIS — Z87891 Personal history of nicotine dependence: Secondary | ICD-10-CM

## 2017-12-07 LAB — POCT INR: INR: 1.3

## 2017-12-07 MED ORDER — ALBUTEROL SULFATE HFA 108 (90 BASE) MCG/ACT IN AERS: 1.0000 | INHALATION_SPRAY | Freq: Four times a day (QID) | RESPIRATORY_TRACT | 3 refills | Status: DC | PRN
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
## 2017-12-07 NOTE — Progress Notes (Signed)
Daily Session Note  Patient Details  Name: VUNG KUSH MRN: [id number] Date of Birth: 12/04/1945 Referring Provider:     Pulmonary Rehab from 09/17/2017 in [facility] Cardiac and Pulmonary Rehab  Referring Provider  Simonne Maffucci MD      Encounter Date: 12/07/2017  Check In: Session Check In - 12/07/17 1112      Check-In   Location  [facility]iac [facility]monary Re[facility]    Staff Present  [place], BS, PEC;Mary-Anne Polizzi [place];Heath Lark, RN, BSN, CCRP    Supervising physician immediately available to respond to emergencies  [name] immediately available ER MD    Physician(s)  Dr. Mariea Clonts and Jacqualine Code    Medication changes reported      No    Fall or balance concerns reported     No    Warm-up and Cool-down  Performed as group-led instruction    Resistance Training Performed  Yes    VAD Patient?  No      Pain Assessment   Currently in Pain?  No/denies          Social History   Tobacco Use  Smoking Status Former Smoker  . Packs/day: 1.00  . Years: 40.00  . Pack years: 40.00  . Types: Cigarettes  . Last attempt to quit: 09/15/2007  . Years since quitting: 10.2  Smokeless Tobacco Never Used    Goals Met:  Independence with exercise equipment Exercise tolerated well No report of cardiac concerns or symptoms Strength training completed today  Goals Unmet:  Not Applicable  Comments: Pt able to follow exercise prescription today without complaint.  Will continue to monitor for progression.   Dr. Emily Filbert is Medical Director for [place] and [name].

## 2017-12-07 NOTE — Patient Instructions (Addendum)
For COPD: Continue pulmonary rehab Continue [place] Use albuterol 2 puffs 10 minutes prior to exercise and every 4-6 hours as needed for shortness of breath  For lung cancer: Repeat CT chest in Februrary  Follow up in 6 months or sooner if needed > 50% of this 26 minute visit spent face to face

## 2017-12-10 DIAGNOSIS — J432 Centrilobular emphysema: Secondary | ICD-10-CM

## 2017-12-10 NOTE — Progress Notes (Signed)
Daily Session Note  Patient Details  Name: [name] MRN: [id number] Date of Birth: 01-17-1946 Referring Provider:     Pulmonary Rehab from 09/17/2017 in [facility]  Referring Provider  Simonne Maffucci MD      Encounter Date: 12/10/2017  Check In: Session Check In - 12/10/17 1123      Check-In   Location  [facility]-Cardiac & Pulmonary Rehab    Staff Present  Nada Maclachlan, BA, ACSM CEP, Exercise Physiologist;Kelly Amedeo Plenty, BS, ACSM CEP, Exercise Physiologist;Stewart Pimenta Flavia Shipper    Supervising physician immediately available to respond to emergencies  [name] immediately available ER MD    Physician(s)  Dr. Lamar Laundry and Mariea Clonts    Medication changes reported      No    Fall or balance concerns reported     No    Warm-up and Cool-[name]  Performed as group-led instruction    Resistance Training Performed  Yes    VAD Patient?  No      VAD patient   Has back up controller?  No      Pain Assessment   Currently in Pain?  No/denies          Social History   Tobacco Use  Smoking Status Former Smoker  . Packs/day: 1.00  . Years: 40.00  . Pack years: 40.00  . Types: Cigarettes  . Last attempt to quit: 09/15/2007  . Years since quitting: 10.2  Smokeless Tobacco Never Used    Goals Met:  Independence with exercise equipment Exercise tolerated well No report of cardiac concerns or symptoms Strength training completed today  Goals Unmet:  Not Applicable  Comments: Pt able to follow exercise prescription today without complaint.  Will continue to monitor for progression.   Dr. Emily Filbert is Medical Director for [name] and [name].

## 2017-12-11 ENCOUNTER — Telehealth: Payer: Self-pay | Admitting: Pulmonary Disease

## 2017-12-11 ENCOUNTER — Other Ambulatory Visit: Payer: Self-pay | Admitting: Cardiovascular Disease

## 2017-12-11 NOTE — Telephone Encounter (Signed)
Please refer to original message. Will close encounter.

## 2017-12-11 NOTE — Telephone Encounter (Signed)
Patient has another message open for different issue

## 2017-12-11 NOTE — Telephone Encounter (Signed)
PCN allergy: fine by me as long as she has taken PCN antibiotics since then without difficulty  Albuterol and Afib: yes this can increase her risk of tachycardia and problems with Afib; unfortunately though there are no short acting bronchodilators (including Xopenex) that reduce this risk.

## 2017-12-11 NOTE — Telephone Encounter (Signed)
Pt reported this was stated back in the 80's, She was tested by Dr. Clovis Pu and she is not allergic to Penicillin and wants this removed from her chart. BQ can we remove?  She has a question about Ventolin, she states she has atrial fib and the side effects cause rapid heart beat and wants to know if this is safe. She also wants to know if this will make her dependent on inhalers?

## 2017-12-11 NOTE — Telephone Encounter (Signed)
Patient has another message open for different issue.

## 2017-12-11 NOTE — Telephone Encounter (Signed)
Called and spoke with patient She advised that she has not used any Penicillin antibiotics without difficulties, advised we leave on her allergy sheet until pt uses penicillin antibiotics without difficulties per BQ recommendations  Pt has several questions, concerns regarding new inhalers with her afib and symptoms.  Offered appt with BQ 12/13/17 at 1:45pm to discuss, would be easier face to face Pt agreed  Nothing further needed at this time

## 2017-12-12 DIAGNOSIS — J432 Centrilobular emphysema: Secondary | ICD-10-CM

## 2017-12-12 NOTE — Progress Notes (Signed)
Daily Session Note  Patient Details  Name: [name] MRN: [id number] Date of Birth: 1946/05/29 Referring Provider:     Pulmonary Rehab from 09/17/2017 in [facility] Cardiac and Pulmonary Rehab  Referring Provider  Simonne Maffucci MD      Encounter Date: 12/12/2017  Check In: Session Check In - 12/12/17 1120      Check-In   Location  [facility]-Cardiac & Pulmonary Rehab    Staff Present  Justin Mend Lorre Nick, [place], RCEP, CCRP, Exercise Physiologist;Meredith Sherryll Burger, RN BSN    Supervising physician immediately available to respond to emergencies  [name] immediately available ER MD    Physician(s)   Dr. Jimmye Norman and Alfred Levins    Medication changes reported      No    Fall or balance concerns reported     No    Warm-up and Cool-down  Performed as group-led instruction    Resistance Training Performed  Yes    VAD Patient?  No      VAD patient   Has back up controller?  No      Pain Assessment   Currently in Pain?  No/denies          Social History   Tobacco Use  Smoking Status Former Smoker  . Packs/day: 1.00  . Years: 40.00  . Pack years: 40.00  . Types: Cigarettes  . Last attempt to quit: 09/15/2007  . Years since quitting: 10.2  Smokeless Tobacco Never Used    Goals Met:  Independence with exercise equipment Exercise tolerated well No report of cardiac concerns or symptoms Strength training completed today  Goals Unmet:  Not Applicable  Comments: Pt able to follow exercise prescription today without complaint.  Will continue to monitor for progression.   Dr. Emily Filbert is Medical Director for [facility] and [name].

## 2017-12-13 ENCOUNTER — Ambulatory Visit (INDEPENDENT_AMBULATORY_CARE_PROVIDER_SITE_OTHER): Payer: Medicare Other | Admitting: Cardiovascular Disease

## 2017-12-13 ENCOUNTER — Ambulatory Visit (INDEPENDENT_AMBULATORY_CARE_PROVIDER_SITE_OTHER): Payer: Medicare Other | Admitting: Pulmonary Disease

## 2017-12-13 ENCOUNTER — Encounter: Payer: Self-pay | Admitting: Cardiovascular Disease

## 2017-12-13 ENCOUNTER — Ambulatory Visit (INDEPENDENT_AMBULATORY_CARE_PROVIDER_SITE_OTHER): Payer: Medicare Other | Admitting: Pharmacist

## 2017-12-13 ENCOUNTER — Encounter: Payer: Self-pay | Admitting: Pulmonary Disease

## 2017-12-13 ENCOUNTER — Telehealth: Payer: Self-pay | Admitting: Internal Medicine

## 2017-12-13 VITALS — BP 138/72 | HR 63 | Ht 63.0 in | Wt 147.0 lb

## 2017-12-13 VITALS — BP 110/60 | HR 55 | Ht 63.0 in | Wt 149.0 lb

## 2017-12-13 DIAGNOSIS — I2510 Atherosclerotic heart disease of native coronary artery without angina pectoris: Secondary | ICD-10-CM

## 2017-12-13 DIAGNOSIS — Z5181 Encounter for therapeutic drug level monitoring: Secondary | ICD-10-CM

## 2017-12-13 DIAGNOSIS — I480 Paroxysmal atrial fibrillation: Secondary | ICD-10-CM

## 2017-12-13 DIAGNOSIS — I739 Peripheral vascular disease, unspecified: Secondary | ICD-10-CM

## 2017-12-13 DIAGNOSIS — J449 Chronic obstructive pulmonary disease, unspecified: Secondary | ICD-10-CM | POA: Diagnosis not present

## 2017-12-13 DIAGNOSIS — Z7901 Long term (current) use of anticoagulants: Secondary | ICD-10-CM

## 2017-12-13 DIAGNOSIS — I2583 Coronary atherosclerosis due to lipid rich plaque: Secondary | ICD-10-CM

## 2017-12-13 DIAGNOSIS — J432 Centrilobular emphysema: Secondary | ICD-10-CM | POA: Diagnosis not present

## 2017-12-13 DIAGNOSIS — E785 Hyperlipidemia, unspecified: Secondary | ICD-10-CM | POA: Diagnosis not present

## 2017-12-13 DIAGNOSIS — Z79899 Other long term (current) drug therapy: Secondary | ICD-10-CM | POA: Diagnosis not present

## 2017-12-13 LAB — POCT INR: INR: 1.3

## 2017-12-13 MED ORDER — SIMVASTATIN 40 MG PO TABS: 40.0000 mg | ORAL_TABLET | Freq: Every day | ORAL | 5 refills | Status: DC

## 2017-12-13 NOTE — Patient Instructions (Signed)
Medication Instructions:  INCREASE simvastatin to 40 mg daily  Labwork: Please return for FASTING labs in 3 months (CMET,Lipid)-lab orders provided.  Follow-Up: Your physician wants you to follow-up in: 6 months with Dr. Claiborne Billings. You will receive a reminder letter in the mail two months in advance. If you don't receive a letter, please call our office to schedule the follow-up appointment.   Any Other Special Instructions Will Be Listed Below (If Applicable).     If you need a refill on your cardiac medications before your next appointment, please call your pharmacy.

## 2017-12-13 NOTE — Telephone Encounter (Signed)
Lmov for patient to call back need to move up time of appt for 12/25/17   Will try again at a later time
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
## 2017-12-13 NOTE — Patient Instructions (Signed)
COPD: Continue using Incruise We discussed the risks of tachycardia with albuterol and the fact that there is very little post market data for Xopenex to strongly support its use over albuterol.  She can use albuterol as needed prior to exercise.  Penicillin allergy: she does not have this

## 2017-12-14 ENCOUNTER — Encounter: Payer: Medicare Other | Attending: Pulmonary Disease

## 2017-12-14 DIAGNOSIS — R609 Edema, unspecified: Secondary | ICD-10-CM | POA: Diagnosis not present

## 2017-12-14 DIAGNOSIS — M25572 Pain in left ankle and joints of left foot: Secondary | ICD-10-CM | POA: Diagnosis not present

## 2017-12-14 DIAGNOSIS — J432 Centrilobular emphysema: Secondary | ICD-10-CM | POA: Insufficient documentation

## 2017-12-14 DIAGNOSIS — R2689 Other abnormalities of gait and mobility: Secondary | ICD-10-CM | POA: Diagnosis not present

## 2017-12-14 NOTE — Progress Notes (Signed)
Daily Session Note  Patient Details  Name: [name] MRN: [id number] Date of Birth: [birth year] Referring Provider:     Pulmonary Rehab from 09/17/2017 in [facility] Cardiac and Pulmonary Rehab  Referring Provider  Simonne Maffucci MD      Encounter Date: 12/14/2017  Check In: Session Check In - 12/14/17 1146      Check-In   Location  [facility]-Cardiac & Pulmonary Rehab    Staff Present  Alberteen Sam, MA, RCEP, CCRP, Exercise Physiologist;Meredith Sherryll Burger, RN BSN;Chiron Campione Flavia Shipper    Supervising physician immediately available to respond to emergencies  [name] immediately available ER MD    Physician(s)   Dr. Burlene Arnt and Clearnce Hasten    Medication changes reported      No    Fall or balance concerns reported     No    Warm-up and Cool-down  Performed as group-led instruction    Resistance Training Performed  Yes    VAD Patient?  No      VAD patient   Has back up controller?  No      Pain Assessment   Currently in Pain?  No/denies          Social History   Tobacco Use  Smoking Status Former Smoker  . Packs/day: 1.00  . Years: 40.00  . Pack years: 40.00  . Types: Cigarettes  . Last attempt to quit: 09/15/2007  . Years since quitting: 10.2  Smokeless Tobacco Never Used    Goals Met:  Independence with exercise equipment Exercise tolerated well No report of cardiac concerns or symptoms Strength training completed today  Goals Unmet:  Not Applicable  Comments: Pt able to follow exercise prescription today without complaint.  Will continue to monitor for progression.   Dr. Emily Filbert is Medical Director for [place] and [name].

## 2017-12-15 ENCOUNTER — Encounter: Payer: Self-pay | Admitting: Cardiovascular Disease

## 2017-12-17 ENCOUNTER — Encounter: Payer: Medicare Other | Admitting: *Deleted

## 2017-12-17 DIAGNOSIS — J432 Centrilobular emphysema: Secondary | ICD-10-CM | POA: Diagnosis not present

## 2017-12-17 NOTE — Progress Notes (Signed)
Daily Session Note  Patient Details  Name: MONIQUE HEFTY MRN: [id number] Date of Birth: [birth year] Referring Provider:     Pulmonary Rehab from 09/17/2017 in [facility] Cardiac and Pulmonary Rehab  Referring Provider  Simonne Maffucci MD      Encounter Date: 12/17/2017  Check In: Session Check In - 12/17/17 1132      Check-In   Location  [facility]-Cardiac & Pulmonary Rehab    Staff Present  Nada Maclachlan, BA, ACSM CEP, Exercise Physiologist;Kelly Amedeo Plenty, BS, ACSM CEP, Exercise Physiologist;Joseph Flavia Shipper    Supervising physician immediately available to respond to emergencies  See telemetry face sheet for immediately available ER MD    Physician(s)  Quentin Cornwall and Jimmye Norman    Medication changes reported      No    Fall or balance concerns reported     No    Warm-up and Cool-down  Performed on first and last piece of equipment    Resistance Training Performed  Yes    VAD Patient?  No      VAD patient   Has back up controller?  No      Pain Assessment   Currently in Pain?  No/denies    Multiple Pain Sites  No          Social History   Tobacco Use  Smoking Status Former Smoker  . Packs/day: 1.00  . Years: 40.00  . Pack years: 40.00  . Types: Cigarettes  . Last attempt to quit: 09/15/2007  . Years since quitting: 10.2  Smokeless Tobacco Never Used    Goals Met:  Independence with exercise equipment Exercise tolerated well No report of cardiac concerns or symptoms Strength training completed today  Goals Unmet:  Not Applicable  Comments: Pt able to follow exercise prescription today without complaint.  Will continue to monitor for progression.    Dr. Emily Filbert is Medical Director for [place] and [name].

## 2017-12-18 DIAGNOSIS — R609 Edema, unspecified: Secondary | ICD-10-CM | POA: Diagnosis not present

## 2017-12-18 DIAGNOSIS — M25572 Pain in left ankle and joints of left foot: Secondary | ICD-10-CM | POA: Diagnosis not present

## 2017-12-18 DIAGNOSIS — R2689 Other abnormalities of gait and mobility: Secondary | ICD-10-CM | POA: Diagnosis not present

## 2017-12-18 NOTE — Addendum Note (Signed)
Addended by: Venetia Maxon on: 12/18/2017 01:14 PM   Modules accepted: Orders

## 2017-12-19 DIAGNOSIS — J432 Centrilobular emphysema: Secondary | ICD-10-CM

## 2017-12-19 NOTE — Progress Notes (Signed)
Daily Session Note  Patient Details  Name: STEPH CHEADLE MRN: [id number] Date of Birth: 02-05-1946 Referring Provider:     Pulmonary Rehab from 09/17/2017 in [facility] Cardiac and Pulmonary Rehab  Referring Provider  Simonne Maffucci MD      Encounter Date: 12/19/2017  Check In: Session Check In - 12/19/17 1146      Check-In   Location  [facility]-Cardiac & Pulmonary Rehab    Staff Present  Alberteen Sam, MA, RCEP, CCRP, Exercise Physiologist;Meredith Sherryll Burger, RN BSN;Joseph Flavia Shipper    Supervising physician immediately available to respond to emergencies  [name] immediately available ER MD    Physician(s)  Drs. Jimmye Norman and [place]itz    Medication changes reported      No    Fall or balance concerns reported     No    Warm-up and Cool-down  Performed as group-led Higher education careers adviser Performed  Yes    VAD Patient?  No      Pain Assessment   Currently in Pain?  Yes          Social History   Tobacco Use  Smoking Status Former Smoker  . Packs/day: 1.00  . Years: 40.00  . Pack years: 40.00  . Types: Cigarettes  . Last attempt to quit: 09/15/2007  . Years since quitting: 10.2  Smokeless Tobacco Never Used    Goals Met:  Proper associated with RPD/PD & O2 Sat Independence with exercise equipment Using PLB without cueing & demonstrates good technique Exercise tolerated well Personal goals reviewed No report of cardiac concerns or symptoms Strength training completed today  Goals Unmet:  Not Applicable  Comments: Pt able to follow exercise prescription today without complaint.  Will continue to monitor for progression.   Dr. Emily Filbert is Medical Director for [place] and [name].

## 2017-12-21 ENCOUNTER — Encounter: Payer: Medicare Other | Admitting: *Deleted

## 2017-12-21 DIAGNOSIS — J432 Centrilobular emphysema: Secondary | ICD-10-CM | POA: Diagnosis not present

## 2017-12-21 NOTE — Progress Notes (Signed)
Daily Session Note  Patient Details  Name: [name] MRN: [id number] Date of Birth: 1946-09-12 Referring Provider:     Pulmonary Rehab from 09/17/2017 in [facility] Cardiac and Pulmonary Rehab  Referring Provider  Simonne Maffucci MD      Encounter Date: 12/21/2017  Check In: Session Check In - 12/21/17 1125      Check-In   Location  [facility]-Cardiac & Pulmonary Rehab    Staff Present  Alberteen Sam, MA, RCEP, CCRP, Exercise Physiologist;Meredith Sherryll Burger, RN BSN;Joseph Flavia Shipper    Supervising physician immediately available to respond to emergencies  [name] immediately available ER MD    Physician(s)  Drs. Lord and [place]    Medication changes reported      No    Fall or balance concerns reported     No    Warm-up and Cool-down  Performed as group-led Higher education careers adviser Performed  Yes    VAD Patient?  No          Social History   Tobacco Use  Smoking Status Former Smoker  . Packs/day: 1.00  . Years: 40.00  . Pack years: 40.00  . Types: Cigarettes  . Last attempt to quit: 09/15/2007  . Years since quitting: 10.2  Smokeless Tobacco Never Used    Goals Met:  Proper associated with RPD/PD & O2 Sat Independence with exercise equipment Using PLB without cueing & demonstrates good technique Exercise tolerated well No report of cardiac concerns or symptoms Strength training completed today  Goals Unmet:  Not Applicable  Comments: Pt able to follow exercise prescription today without complaint.  Will continue to monitor for progression.    Dr. Emily Filbert is Medical Director for [name] and [name].

## 2017-12-24 ENCOUNTER — Ambulatory Visit (INDEPENDENT_AMBULATORY_CARE_PROVIDER_SITE_OTHER): Payer: Medicare Other

## 2017-12-24 ENCOUNTER — Ambulatory Visit (INDEPENDENT_AMBULATORY_CARE_PROVIDER_SITE_OTHER)
Admission: RE | Admit: 2017-12-24 | Discharge: 2017-12-24 | Disposition: A | Discharge status: Home / Self Care | Payer: Medicare Other | Source: Ambulatory Visit | Attending: Pulmonary Disease | Admitting: Pulmonary Disease

## 2017-12-24 ENCOUNTER — Telehealth: Payer: Self-pay | Admitting: Cardiovascular Disease

## 2017-12-24 DIAGNOSIS — Z5181 Encounter for therapeutic drug level monitoring: Secondary | ICD-10-CM

## 2017-12-24 DIAGNOSIS — C3492 Malignant neoplasm of unspecified part of left bronchus or lung: Secondary | ICD-10-CM | POA: Diagnosis not present

## 2017-12-24 DIAGNOSIS — I480 Paroxysmal atrial fibrillation: Secondary | ICD-10-CM | POA: Diagnosis not present

## 2017-12-24 DIAGNOSIS — J432 Centrilobular emphysema: Secondary | ICD-10-CM

## 2017-12-24 DIAGNOSIS — R0602 Shortness of breath: Secondary | ICD-10-CM | POA: Diagnosis not present

## 2017-12-24 LAB — POCT INR: INR: 2.2

## 2017-12-24 NOTE — Progress Notes (Signed)
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 for safety during independent exercise       Expected Outcomes  Short Term: Able to explain why pulse checking is important during independent exercise;Long Term: Able to check pulse independently and accurately       Understanding of Exercise Prescription  Yes       Intervention  Provide education, explanation, and written materials on patient's individual exercise prescription       Expected Outcomes  Long Term: Able to explain home exercise prescription to exercise independently;Short Term: Able to explain program exercise prescription          Exercise Goals Re-Evaluation : Exercise Goals Re-Evaluation    Row Name 09/21/17 1150 10/01/17 1630 10/03/17 1235 10/16/17 1530 10/31/17 1423     Exercise Goal Re-Evaluation   Exercise Goals Review  Understanding of Exercise Prescription;Knowledge and understanding of Target Heart Rate Range (THRR);Able to understand and use Dyspnea scale;Able to understand and use rate of perceived exertion (RPE) scale  Increase Physical  Activity;Increase Strength and Stamina;Understanding of Exercise Prescription  Increase Physical Activity;Understanding of Exercise Prescription;Knowledge and understanding of Target Heart Rate Range (THRR);Able to understand and use rate of perceived exertion (RPE) scale;Able to check pulse independently  Increase Physical Activity;Increase Strength and Stamina;Understanding of Exercise Prescription  Increase Physical Activity;Increase Strength and Stamina;Understanding of Exercise Prescription   Comments  Reviewed RPE scale, THR and program prescription with pt today.  Pt voiced understanding and was given a copy of goals to take home.   [name] is off to a good start with rehab.  She has been doing well and attending only two days a week.  She has already moved up to level two on the NuStep.  Will continue to monitor her progress.   Reviewed home exercise with pt today.  Pt plans to continue walking at the track and mall for exercise.  She is already exercising daily but need to work on increasing to 30 minutes of continuous cardio. [name] is going to start adding a minute each time she walks.  Reviewed THR, pulse, RPE, sign and symptoms, and when to call 911 or MD.  Also discussed weather considerations and indoor options.  Pt voiced understanding.  [name] has been doing well in rehab.  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  She has been out since 11/26 but hopes to return tomorrow.  She is planning to increase her speed on the treadmill at her next visit.  We will continue to monitor her progress.   Yuna is doing well in rehab.  She is now up to 2.5 mph on the treadmill.  We will continue to monitor her progression.    Expected Outcomes  Short: Use RPE daily to regulate intensity.  Long: Follow program prescription in THR.  Short: Move up workloads.  Long: Continue to increase exercise at home.   Short: Start increasing time to 30 min by adding in one minute each time she  goes.  Long: Continue to exercise independently  Short: Return to rehab and be able to pick back up to workloads again.  Long: Continue to work on increasing exercise time at home.   Short: Increase workloads on NuStep and XR.  Long: Continue to exercise at home.    [name] Name 11/14/17 1408 11/27/17 1605 12/12/17 1504 12/19/17 1148       Exercise Goal Re-Evaluation   Exercise Goals Review  Increase Physical Activity;Increase Strength and Stamina;Understanding of Exercise Prescription  Increase Physical Activity;Increase Strength and Stamina;Understanding of Exercise Prescription  Increase Physical Activity;Increase Strength and Stamina;Understanding of Exercise Prescription  Increase Physical Activity;Increase Strength and Stamina;Understanding of Exercise Prescription    Comments  [name] continues to do well in rehab.  Today, she came in wearing a boot as her ankle had still been hurting. She has an appointment with an orthopedic doctor tomorrow to discuss her injury and exercise going forward. We will wait to see what the doctor advises tomorrow.   [name] has been doing well in rehab.  She wears her boot into class and switches to a shoe with a brace for exercise.  She says that it is starting to feel better.  She is now on level 3 on the XR!  We will continue to monitor her progression.   [name] continues to do well in rehab.  She is stilling wearing her boot and has started to do some PT with her foot as well.  Today, she asked to hold off on the recumbent bike as she felt that it is bothering her ankle.  We will continue to monitor her progress.   [name] now is exercising without the brace and is out the boot!!  She was able to do the treadmill earlier this week and walked on the treadmill at home at 3.0 mph.  It is feeling better and overall she  is feeling better as well with more strength and stamina.     Expected Outcomes  Short: If able to exercise, continue to work on increasing workloads.  Long:  Continue to exercise at home on off days.  Short: Try to increase workload on arm ergometer.  Long: Continue to exercise independently.   Short: Try to find a third piece of equipment that will work long term for her possibly try the BioStep.  Long: Continue to exercise independently.   Short: Back on treadmill!!  Long: Continue to exercise independently.        Discharge Exercise Prescription (Final Exercise Prescription Changes): Exercise Prescription Changes - 12/12/17 1500      Response to Exercise   Blood Pressure (Admit)  124/68    Blood Pressure (Exit)  122/62    Heart Rate (Admit)  82 bpm    Heart Rate (Exercise)  93 bpm    Heart Rate (Exit)  70 bpm    Oxygen Saturation (Admit)  99 %    Oxygen Saturation (Exercise)  97 %    Oxygen Saturation (Exit)  96 %    Rating of Perceived Exertion (Exercise)  15    Perceived Dyspnea (Exercise)  2    Symptoms  none    Duration  Continue with 45 min of aerobic exercise without signs/symptoms of physical distress.    Intensity  THRR unchanged      Progression   Progression  Continue to progress workloads to maintain intensity without signs/symptoms of physical distress.    Average METs  4.07      Resistance Training   Training Prescription  Yes    Weight  3 lbs    Reps  10-15      Interval Training   Interval Training  No      Recumbant Bike   Level  1    RPM  75    Watts  19    Minutes  15    METs  2.9      NuStep   Level  4    SPM  100    Minutes  15    METs  3.9      REL-XR   Level  4    Speed  69    Minutes  15    METs  5.4      Home Exercise Plan   Plans to continue exercise at  Home (comment) walking at track and mall    Frequency  Add 3 additional days to program exercise sessions. continue to walk    Initial Home Exercises Provided  10/03/17       Nutrition:  Target Goals: Understanding of nutrition guidelines, daily intake of sodium '1500mg'$ , cholesterol '200mg'$ , calories 30% from fat and 7% or less from  saturated fats, daily to have 5 or more servings of fruits and vegetables.  Biometrics: Pre Biometrics - 09/17/17 1611      Pre Biometrics   Height  5' 3.6" (1.615 m)    Weight  148 lb 8 oz (67.4 kg)    Waist Circumference  32.25 inches    Hip Circumference  41.25 inches    Waist to Hip Ratio  0.78 %    BMI (Calculated)  25.83        Nutrition Therapy Plan and Nutrition Goals: Nutrition Therapy & Goals - 10/19/17 1209      Nutrition Therapy   RD appointment deferred  Yes       Nutrition  Assessments: Nutrition Assessments - 09/17/17 1451      MEDFICTS Scores   Pre Score  17       Nutrition Goals Re-Evaluation: Nutrition Goals Re-Evaluation    Row Name 10/19/17 1210 11/09/17 1154 12/10/17 1245         Goals   Current Weight  150 lb (68 kg)  147 lb 1.6 oz (66.7 kg)  150 lb (68 kg)     Nutrition Goal  Eat a heart healthy diet.  Continue to eat a heart healthy, low sodium diet  lose some weight.     Comment  She has deferred the dietician appointment at this time. When she had her heart attack she has changed her eating habits 10 years ago.  She came to the nutrition classes but does not want to meet with a dietician one on one at this time. She feels comfortable reading food labels and understanding what is good for her diagnosis   She mostly follows a heart healthy diet. She has not ate any beef for 10 years. She admits to eating banana bread and angel food cake so she had put on a couple pounds.     Expected Outcome  Short: lose a few pounds. Long: eat healthy and exercise to maintain weight loss  Short: continue to lose a few pounds to get to goal wait of 140lb. Long: continue to eat healthy and understand diet recommendations for diagnosis   Short: work on losing a few pounds. Long: lose and maintain weight independently        Nutrition Goals Discharge (Final Nutrition Goals Re-Evaluation): Nutrition Goals Re-Evaluation - 12/10/17 1245      Goals   Current Weight   150 lb (68 kg)    Nutrition Goal  lose some weight.    Comment  She mostly follows a heart healthy diet. She has not ate any beef for 10 years. She admits to eating banana bread and angel food cake so she had put on a couple pounds.    Expected Outcome  Short: work on losing a few pounds. Long: lose and maintain weight independently       Psychosocial: Target Goals: Acknowledge presence or absence of significant depression and/or stress, maximize coping skills, provide positive support system. Participant is able to verbalize types and ability to use techniques and skills needed for reducing stress and depression.   Initial Review & Psychosocial Screening: Initial Psych Review & Screening - 09/17/17 1502      Initial Review   Current issues with  None Identified      Family Dynamics   Good Support System?  Yes    Comments  her two kids and grandchildren are great for support      Barriers   Psychosocial barriers to participate in program  There are no identifiable barriers or psychosocial needs.;The patient should benefit from training in stress management and relaxation.      Screening Interventions   Interventions  Encouraged to exercise;Program counselor consult;Provide feedback about the scores to participant;To provide support and resources with identified psychosocial needs;Yes    Expected Outcomes  Short Term goal: Utilizing psychosocial counselor, staff and physician to assist with identification of specific Stressors or current issues interfering with healing process. Setting desired goal for each stressor or current issue identified.;Long Term Goal: Stressors or current issues are controlled or eliminated.;Short Term goal: Identification and review with participant of any Quality of Life or Depression concerns found by scoring the questionnaire.;Long Term  goal: The participant improves quality of Life and PHQ9 Scores as seen by post scores and/or verbalization of changes        Quality of Life Scores:  Scores of 19 and below usually indicate a poorer quality of life in these areas.  A difference of  2-3 points is a clinically meaningful difference.  A difference of 2-3 points in the total score of the Quality of Life Index has been associated with significant improvement in overall quality of life, self-image, physical symptoms, and general health in studies assessing change in quality of life.  PHQ-9: Recent Review Flowsheet Data    Depression screen [facility] 2/9 09/17/2017   Decreased Interest 0   Down, Depressed, Hopeless 0   PHQ - 2 Score 0   Altered sleeping 0   Tired, decreased energy 1   Change in appetite 0   Feeling bad or failure about yourself  0   Trouble concentrating 1   Moving slowly or fidgety/restless 0   Suicidal thoughts 0   PHQ-9 Score 2   Difficult doing work/chores Not difficult at all     Interpretation of Total Score  Total Score Depression Severity:  1-4 = Minimal depression, 5-9 = Mild depression, 10-14 = Moderate depression, 15-19 = Moderately severe depression, 20-27 = Severe depression   Psychosocial Evaluation and Intervention: Psychosocial Evaluation - 10/29/17 1251      Psychosocial Evaluation & Interventions   Interventions  Encouraged to exercise with the program and follow exercise prescription;Relaxation education;Stress management education    Comments  Counselor met with Ms. [name]) today for initial psychosocial evaluation.  She is a 72 year old who had lower left lob lumpectomy for lung cancer this past August.  She has a strong support system with a daughter and grandchildren close by and active involvement in her local church.  [name] has had multiple health issues over the past 10 years with CABGx3 in 2008; a ruptured colon in 2015; and subsequent hernea repairs after that.  She sleeps well and has a good appetite.  [name] denies a history of depression or anxiety and states her mood is generally  positive  most of the time.  Zhoe says her health is her primary stressor.  She has goals to learn to cope with her SOB and to increase her stamina and strength while in this program.  She also would like to get back to walking 2 miles a day as she was prior to her last Surgery in August.      Expected Outcomes  [name] will benefit from consistent exercise to achieve her stated goals.  The educational and psychoeducational components will be helpful in learning more about her condition and coping more positively.      Continue Psychosocial Services   Follow up required by staff       Psychosocial Re-Evaluation: Psychosocial Re-Evaluation    [name] Name 10/19/17 1213 11/09/17 1157 12/19/17 1200         Psychosocial Re-Evaluation   Current issues with  Current Stress Concerns  Current Stress Concerns  Current Stress Concerns     Comments  She gets stressed out when she is short of breath. She has an ankle injury that has slowed her down recently but is working around it. [name] is happy in general. She has projects to work on that she is behind on due to her surgeries in the past .  She reports that her stress level is down even more since staff  last checked in, even in spite of the holidays. She is looking forward to a new year with hopefully minimal health issues.   [name] has been doing well in rehab.  She has started a new inhaler and she is nervous about it flipping her into afib. She has been doing well overall.  She is out of her boot and ready to move more.  She is planning to get out on the golf course again.  She has been enjoying the nice weather that we are having and ready to get back outside and be active again.  Her biggest problem has been that she is still sore from the drainage tubes.  Overall she feels good!!  She is nearing graduation!     Expected Outcomes  Short: continue to exercise in [name]. Long: Maintain an exercise routine to keep stress minimized.  Short: continue to exercise in  [name]. Long: Maintain an exercise routine and apply lessons from class on deep breathing and destressing.   Short: Continue to maintain positive attitude and get outside again.  Long: Enjoying being out and about again.      Interventions  Encouraged to attend Pulmonary Rehabilitation for the exercise  Encouraged to attend Pulmonary Rehabilitation for the exercise  Encouraged to attend Pulmonary Rehabilitation for the exercise     Continue Psychosocial Services   Follow up required by staff  Follow up required by staff  Follow up required by staff        Psychosocial Discharge (Final Psychosocial Re-Evaluation): Psychosocial Re-Evaluation - 12/19/17 1200      Psychosocial Re-Evaluation   Current issues with  Current Stress Concerns    Comments  [name] has been doing well in rehab.  She has started a new inhaler and she is nervous about it flipping her into afib. She has been doing well overall.  She is out of her boot and ready to move more.  She is planning to get out on the golf course again.  She has been enjoying the nice weather that we are having and ready to get back outside and be active again.  Her biggest problem has been that she is still sore from the drainage tubes.  Overall she feels good!!  She is nearing graduation!    Expected Outcomes  Short: Continue to maintain positive attitude and get outside again.  Long: Enjoying being out and about again.     Interventions  Encouraged to attend Pulmonary Rehabilitation for the exercise    Continue Psychosocial Services   Follow up required by staff       Education: Education Goals: Education classes will be provided on a weekly basis, covering required topics. Participant will state understanding/return demonstration of topics presented.  Learning Barriers/Preferences: Learning Barriers/Preferences - 09/17/17 1504      Learning Barriers/Preferences   Learning Barriers  Sight wears Glasses    Learning Preferences  None        Education Topics:  Initial Evaluation Education: - Verbal, written and demonstration of respiratory meds, oximetry and breathing techniques. Instruction on use of nebulizers and MDIs and importance of monitoring MDI activations.   Pulmonary Rehab from 12/17/2017 in [facility] Cardiac and Pulmonary Rehab  Date  09/17/17  Educator  [facility]  Instruction Review Code  1- Verbalizes Understanding      General Nutrition Guidelines/Fats and Fiber: -Group instruction provided by verbal, written material, models and posters to present the general guidelines for heart healthy nutrition. Gives an explanation and review of dietary  fats and fiber.   Pulmonary Rehab from 12/17/2017 in [facility] Cardiac and Pulmonary Rehab  Date  12/10/17  Educator  CR  Instruction Review Code  1- Verbalizes Understanding      Controlling Sodium/Reading Food Labels: -Group verbal and written material supporting the discussion of sodium use in heart healthy nutrition. Review and explanation with models, verbal and written materials for utilization of the food label.   Pulmonary Rehab from 12/17/2017 in [facility] Cardiac and Pulmonary Rehab  Date  12/17/17  Educator  CR  Instruction Review Code  1- Verbalizes Understanding      Exercise Physiology & General Exercise Guidelines: - Group verbal and written instruction with models to review the exercise physiology of the cardiovascular system and associated critical values. Provides general exercise guidelines with specific guidelines to those with heart or lung disease.    Pulmonary Rehab from 12/17/2017 in [facility] Cardiac and Pulmonary Rehab  Date  11/16/17  Educator  [facility]  Instruction Review Code  1- Verbalizes Understanding      Aerobic Exercise & Resistance Training: - Gives group verbal and written instruction on the various components of exercise. Focuses on aerobic and resistive training programs and the benefits of this training and how to safely progress through these programs.    Pulmonary Rehab from 12/17/2017 in [facility] Cardiac and Pulmonary Rehab  Date  11/30/17  Educator  AS  Instruction Review Code  1- Verbalizes Understanding      Flexibility, Balance, Mind/Body Relaxation: Provides group verbal/written instruction on the benefits of flexibility and balance training, including mind/body exercise modes such as yoga, pilates and tai chi.  Demonstration and skill practice provided.   Pulmonary Rehab from 12/17/2017 in [facility] Cardiac and Pulmonary Rehab  Date  10/03/17  Educator  AS  Instruction Review Code  2- Demonstrated Understanding      Stress and Anxiety: - Provides group verbal and written instruction about the health risks of elevated stress and causes of high stress.  Discuss the correlation between heart/lung disease and anxiety and treatment options. Review healthy ways to manage with stress and anxiety.   Depression: - Provides group verbal and written instruction on the correlation between heart/lung disease and depressed mood, treatment options, and the stigmas associated with seeking treatment.   Exercise & Equipment Safety: - Individual verbal instruction and demonstration of equipment use and safety with use of the equipment.   Pulmonary Rehab from 12/17/2017 in [facility] Cardiac and Pulmonary Rehab  Date  09/17/17  Educator  [facility]  Instruction Review Code  1- Verbalizes Understanding      Infection Prevention: - Provides verbal and written material to individual with discussion of infection control including proper hand washing and proper equipment cleaning during exercise session.   Pulmonary Rehab from 12/17/2017 in [facility] Cardiac and Pulmonary Rehab  Date  09/17/17  Educator  [facility]  Instruction Review Code  1- Verbalizes Understanding      Falls Prevention: - Provides verbal and written material to individual with discussion of falls prevention and safety.   Pulmonary Rehab from 12/17/2017 in [facility] Cardiac and Pulmonary Rehab  Date  09/17/17  Educator   [facility]  Instruction Review Code  1- Verbalizes Understanding      Diabetes: - Individual verbal and written instruction to review signs/symptoms of diabetes, desired ranges of glucose level fasting, after meals and with exercise. Advice that pre and post exercise glucose checks will be done for 3 sessions at entry of program.   Chronic Lung Diseases: -  Group verbal and written instruction to review updates, respiratory medications, advancements in procedures and treatments. Discuss use of supplemental oxygen including available portable oxygen systems, continuous and intermittent flow rates, concentrators, personal use and safety guidelines. Review proper use of inhaler and spacers. Provide informative websites for self-education.    Pulmonary Rehab from 12/17/2017 in [facility] Cardiac and Pulmonary Rehab  Date  11/14/17  Educator  [facility]  Instruction Review Code  1- Verbalizes Understanding      Energy Conservation: - Provide group verbal and written instruction for methods to conserve energy, plan and organize activities. Instruct on pacing techniques, use of adaptive equipment and posture/positioning to relieve shortness of breath.   Triggers and Exacerbations: - Group verbal and written instruction to review types of environmental triggers and ways to prevent exacerbations. Discuss weather changes, air quality and the benefits of nasal washing. Review warning signs and symptoms to help prevent infections. Discuss techniques for effective airway clearance, coughing, and vibrations.   Pulmonary Rehab from 12/17/2017 in [facility] Cardiac and Pulmonary Rehab  Date  11/28/17  Educator  [facility]  Instruction Review Code  1- Verbalizes Understanding      AED/CPR: - Group verbal and written instruction with the use of models to demonstrate the basic use of the AED with the basic ABC's of resuscitation.   Pulmonary Rehab from 12/17/2017 in [facility] Cardiac and Pulmonary Rehab  Date  11/02/17  Educator  [facility]  Instruction  Review Code  1- Actuary and Physiology of the Lungs: - Group verbal and written instruction with the use of models to provide basic lung anatomy and physiology related to function, structure and complications of lung disease.   Anatomy & Physiology of the Heart: - Group verbal and written instruction and models provide basic cardiac anatomy and physiology, with the coronary electrical and arterial systems. Review of Valvular disease and Heart Failure   Pulmonary Rehab from 12/17/2017 in [facility] Cardiac and Pulmonary Rehab  Date  12/12/17  Educator  [facility]  Instruction Review Code  1- Verbalizes Understanding      Cardiac Medications: - Group verbal and written instruction to review commonly prescribed medications for heart disease. Reviews the medication, class of the drug, and side effects.   Pulmonary Rehab from 12/17/2017 in [facility] Cardiac and Pulmonary Rehab  Date  10/19/17  Educator  [place]  Instruction Review Code  1- Verbalizes Understanding      Know Your Numbers and Risk Factors: -Group verbal and written instruction about important numbers in your health.  Discussion of what are risk factors and how they play a role in the disease process.  Review of Cholesterol, Blood Pressure, Diabetes, and BMI and the role they play in your overall health.   Pulmonary Rehab from 12/17/2017 in [facility] Cardiac and Pulmonary Rehab  Date  11/21/17  Educator  [facility]  Instruction Review Code  1- Verbalizes Understanding      Sleep Hygiene: -Provides group verbal and written instruction about how sleep can affect your health.  Define sleep hygiene, discuss sleep cycles and impact of sleep habits. Review good sleep hygiene tips.    Other: -Provides group and verbal instruction on various topics (see comments)   Pulmonary Rehab from 12/17/2017 in [facility] Cardiac and Pulmonary Rehab  Date  12/05/17  Educator  [name]  Instruction Review Code  1- Verbalizes Understanding [SLEEP]        Knowledge Questionnaire Score: Knowledge Questionnaire Score - 09/17/17 1537  Knowledge Questionnaire Score   Pre Score  17/18 Reviewed with patient        Core Components/Risk Factors/Patient Goals at Admission: Personal Goals and Risk Factors at Admission - 09/17/17 1507      Core Components/Risk Factors/Patient Goals on Admission    Weight Management  Yes;Weight Loss    Intervention  Weight Management: Develop a combined nutrition and exercise program designed to reach desired caloric intake, while maintaining appropriate intake of nutrient and fiber, sodium and fats, and appropriate energy expenditure required for the weight goal.;Weight Management: Provide education and appropriate resources to help participant work on and attain dietary goals.;Weight Management/Obesity: Establish reasonable short term and long term weight goals.    Admit Weight  148 lb 8 oz (67.4 kg)    Goal Weight: Short Term  141 lb (64 kg)    Goal Weight: Long Term  136 lb (61.7 kg)    Expected Outcomes  Short Term: Continue to assess and modify interventions until short term weight is achieved;Long Term: Adherence to nutrition and physical activity/exercise program aimed toward attainment of established weight goal;Weight Maintenance: Understanding of the daily nutrition guidelines, which includes 25-35% calories from fat, 7% or less cal from saturated fats, less than '200mg'$  cholesterol, less than 1.5gm of sodium, & 5 or more servings of fruits and vegetables daily;Weight Loss: Understanding of general recommendations for a balanced deficit meal plan, which promotes 1-2 lb weight loss per week and includes a negative energy balance of [phone] kcal/d;Understanding recommendations for meals to include 15-35% energy as protein, 25-35% energy from fat, 35-60% energy from carbohydrates, less than '200mg'$  of dietary cholesterol, 20-35 gm of total fiber daily;Understanding of distribution of calorie intake throughout  the day with the consumption of 4-5 meals/snacks    Improve shortness of breath with ADL's  Yes    Intervention  Provide education, individualized exercise plan and daily activity instruction to help decrease symptoms of SOB with activities of daily living.    Expected Outcomes  Short Term: Achieves a reduction of symptoms when performing activities of daily living.    Heart Failure  Yes    Intervention  Provide a combined exercise and nutrition program that is supplemented with education, support and counseling about heart failure. Directed toward relieving symptoms such as shortness of breath, decreased exercise tolerance, and extremity edema.    Expected Outcomes  Improve functional capacity of life;Short term: Attendance in program 2-3 days a week with increased exercise capacity. Reported lower sodium intake. Reported increased fruit and vegetable intake. Reports medication compliance.;Short term: Daily weights obtained and reported for increase. Utilizing diuretic protocols set by physician.;Long term: Adoption of self-care skills and reduction of barriers for early signs and symptoms recognition and intervention leading to self-care maintenance.    Hypertension  Yes    Intervention  Provide education on lifestyle modifcations including regular physical activity/exercise, weight management, moderate sodium restriction and increased consumption of fresh fruit, vegetables, and low fat dairy, alcohol moderation, and smoking cessation.;Monitor prescription use compliance.    Expected Outcomes  Long Term: Maintenance of blood pressure at goal levels.;Short Term: Continued assessment and intervention until BP is < 140/37m HG in hypertensive participants. < 130/866mHG in hypertensive participants with diabetes, heart failure or chronic kidney disease.    Lipids  Yes takes medication    Intervention  Provide education and support for participant on nutrition & aerobic/resistive exercise along with  prescribed medications to achieve LDL '70mg'$ , HDL >'40mg'$ .    Expected Outcomes  Long Term: Cholesterol controlled with medications as prescribed, with individualized exercise RX and with personalized nutrition plan. Value goals: LDL < '70mg'$ , HDL > 40 mg.;Short Term: Participant states understanding of desired cholesterol values and is compliant with medications prescribed. Participant is following exercise prescription and nutrition guidelines.       Core Components/Risk Factors/Patient Goals Review:  Goals and Risk Factor Review    Row Name 10/19/17 1217 11/09/17 1146 12/19/17 1150         Core Components/Risk Factors/Patient Goals Review   Personal Goals Review  Improve shortness of breath with ADL's;Lipids;Hypertension;Heart Failure;Stress;Weight Management/Obesity  Improve shortness of breath with ADL's;Hypertension;Lipids;Heart Failure;Weight Management/Obesity;Stress  Improve shortness of breath with ADL's;Hypertension;Lipids;Heart Failure;Weight Management/Obesity     Review  Onie's Lipids back in October where within normal limits. She is taking her medication as directed for her cholesterol. Her blood pressure has improved slightly since the begining of the program. She feels like her strength has improved and has vacuumed recently and she said she did not get as short of breath.  [name] is still taking her cholesterol medicine and eating healthier to improve numbers. Her blood pressure and heart rate are doing well. Her stamina and strength have greatly improved since the beginning of the program. Stress level has improved. She has been taking B6 and limiting sodium to help her heart failure symptoms.   [name] is doing well with her weight.  She fluctuates between 147-150 lbs.  She weighs daily and has her plan set if she goes over her normal.  She has continued to watch her sodium intake and has not had any symptoms of her heart failure.  She had a follow with both cardiologist and  pulmonolgist.  Heart is good and she has another CT scan of her lungs scheduled in a few weeks.  Breathing is getting better and her saturations have been good.  She is able to do more at home without SOB.  Her strength has helped and she impressed her daughter moving around chairs. [name]'s blood pressures have been good  and she has continued to check it at home.  She has not had a lipid panel checked but they did increase her simastatin.      Expected Outcomes  Short: continue to attend [name] Regularly. Long: Improve blood pressure readings further.  Short: continue to attend rehab and education. Long: continue to improve SOB and stamina, take what she has learned and apply at home.   Short: Continue to stay on top of weight and heart failure.  Long: Continue to get stronger and improved SOB.         Core Components/Risk Factors/Patient Goals at Discharge (Final Review):  Goals and Risk Factor Review - 12/19/17 1150      Core Components/Risk Factors/Patient Goals Review   Personal Goals Review  Improve shortness of breath with ADL's;Hypertension;Lipids;Heart Failure;Weight Management/Obesity    Review  Patrizia is doing well with her weight.  She fluctuates between 147-150 lbs.  She weighs daily and has her plan set if she goes over her normal.  She has continued to watch her sodium intake and has not had any symptoms of her heart failure.  She had a follow with both cardiologist and pulmonolgist.  Heart is good and she has another CT scan of her lungs scheduled in a few weeks.  Breathing is getting better and her saturations have been good.  She is able to do more at home without SOB.  Her strength has helped  and she impressed her daughter moving around chairs. [name]'s blood pressures have been good  and she has continued to check it at home.  She has not had a lipid panel checked but they did increase her simastatin.     Expected Outcomes  Short: Continue to stay on top of weight and heart  failure.  Long: Continue to get stronger and improved SOB.        ITP Comments: ITP Comments    Row Name 09/17/17 1515 10/01/17 0849 10/29/17 0832 11/26/17 0832 12/24/17 0932   ITP Comments  Medical Evaluation completed. Chart sent for review and changes to Dr. Emily Filbert Director of [place]. Diagnosis can be found in [facility] encounter 09/17/17  30 day review completed. ITP sent to Dr. Emily Filbert Director of [place]. Continue with ITP unless changes are made by physician.    30 day review completed. ITP sent to Dr. Emily Filbert Director of [place]. Continue with ITP unless changes are made by physician.    30 day review completed. ITP sent to Dr. Emily Filbert Director of [place]. Continue with ITP unless changes are made by physician.  30 day review completed. ITP sent to Dr. Emily Filbert Director of [place]. Continue with ITP unless changes are made by physician.      Comments: 30 day review

## 2017-12-24 NOTE — Telephone Encounter (Signed)
Okay to recheck labs now and then we can determine the next course of action

## 2017-12-24 NOTE — Progress Notes (Signed)
Daily Session Note  Patient Details  Name: [name] MRN: [id number] Date of Birth: 1946/08/15 Referring Provider:     Pulmonary Rehab from 09/17/2017 in [facility] Cardiac and Pulmonary Rehab  Referring Provider  Simonne Maffucci MD      Encounter Date: 12/24/2017  Check In: Session Check In - 12/24/17 1123      Check-In   Location  [facility]-Cardiac & Pulmonary Rehab    Staff Present  Earlean Shawl, BS, ACSM CEP, Exercise Physiologist;Amanda Oletta Darter, BA, ACSM CEP, Exercise Physiologist;Prestin Munch Flavia Shipper    Supervising physician immediately available to respond to emergencies  [name] immediately available ER MD    Physician(s)  Dr. Corky Downs and [place]    Medication changes reported      No    Fall or balance concerns reported     No    Warm-up and Cool-down  Performed as group-led instruction    Resistance Training Performed  Yes    VAD Patient?  No          Social History   Tobacco Use  Smoking Status Former Smoker  . Packs/day: 1.00  . Years: 40.00  . Pack years: 40.00  . Types: Cigarettes  . Last attempt to quit: 09/15/2007  . Years since quitting: 10.2  Smokeless Tobacco Never Used    Goals Met:  Independence with exercise equipment Exercise tolerated well No report of cardiac concerns or symptoms Strength training completed today  Goals Unmet:  Not Applicable  Comments: Pt able to follow exercise prescription today without complaint.  Will continue to monitor for progression.   Dr. Emily Filbert is Medical Director for [place] and [name].

## 2017-12-24 NOTE — Patient Instructions (Signed)
Description   Continue on same dosage 1 tablet daily except 1.5 tablet on Tuesdays and Thursdays.  Recheck in 2 weeks.

## 2017-12-24 NOTE — Telephone Encounter (Signed)
Last labs in October 2018  Is it OK for her to go ahead and have lipid/CMET? She cannot tolerate simvastatin 40mg 

## 2017-12-24 NOTE — Telephone Encounter (Signed)
New message   He doubled her cimperstatin from 20 to 40  Every statin drug she has caused her leg pain and she likes to walk but cant on the meds Pt had to stop taking gmeds and she wants to go ahead do leg right sooner that   If possible Pt c/o medication issue:  1. Name of Medication: simvastatin (ZOCOR) 40 MG tablet  2. How are you currently taking this medication (dosage and times per day)? Take 1 tablet (40 mg total) by mouth daily at 6 PM.  3. Are you having a reaction (difficulty breathing--STAT)? Leg pain  4. What is your medication issue? Pt says Claiborne Billings changed her mg from 20 to 40mg  and she had to stop taking her simvastatin because it was giving her leg pain and would also like to move her lipids to an earlier date because she feels the previous did not give accurate results. Please call

## 2017-12-25 ENCOUNTER — Ambulatory Visit: Payer: Medicare Other | Admitting: Internal Medicine

## 2017-12-25 NOTE — Telephone Encounter (Signed)
Left detailed message that MD has OK'ed her to go ahead and have lab work fasting. Also sent message via MyChart

## 2017-12-26 DIAGNOSIS — J432 Centrilobular emphysema: Secondary | ICD-10-CM

## 2017-12-26 NOTE — Progress Notes (Signed)
Called spoke with patient, advised of CT results / recs as stated by BQ.  Pt verbalized her understanding and denied any questions.

## 2017-12-26 NOTE — Progress Notes (Signed)
Daily Session Note  Patient Details  Name: [name] MRN: [id number] Date of Birth: October 08, 1946 Referring Provider:     Pulmonary Rehab from 09/17/2017 in [facility] Cardiac and Pulmonary Rehab  Referring Provider  Simonne Maffucci MD      Encounter Date: 12/26/2017  Check In: Session Check In - 12/26/17 1118      Check-In   Location  [facility]-Cardiac & Pulmonary Rehab    Staff Present  Alberteen Sam, MA, RCEP, CCRP, Exercise Physiologist;Meredith Sherryll Burger, RN BSN;Colena Ketterman Flavia Shipper    Supervising physician immediately available to respond to emergencies  [name] immediately available ER MD    Physician(s)  Dr. Burlene Arnt and Alfred Levins    Medication changes reported      No    Fall or balance concerns reported     No    Warm-up and Cool-down  Performed as group-led instruction    Resistance Training Performed  Yes    VAD Patient?  No      VAD patient   Has back up controller?  No      Pain Assessment   Currently in Pain?  No/denies        Exercise Prescription Changes - 12/25/17 1400      Response to Exercise   Blood Pressure (Admit)  120/72    Blood Pressure (Exit)  116/66    Heart Rate (Admit)  69 bpm    Heart Rate (Exercise)  94 bpm    Heart Rate (Exit)  89 bpm    Oxygen Saturation (Admit)  97 %    Oxygen Saturation (Exercise)  95 %    Oxygen Saturation (Exit)  98 %    Rating of Perceived Exertion (Exercise)  15    Perceived Dyspnea (Exercise)  3    Symptoms  none    Duration  Continue with 45 min of aerobic exercise without signs/symptoms of physical distress.    Intensity  THRR unchanged      Progression   Progression  Continue to progress workloads to maintain intensity without signs/symptoms of physical distress.    Average METs  4.34      Resistance Training   Training Prescription  Yes    Weight  4 lbs    Reps  10-15      Interval Training   Interval Training  No      Treadmill   MPH  2.9    Grade  0.5    Minutes  15    METs  3.42      NuStep   Level  4    SPM  107    Minutes  15    METs  4      REL-XR   Level  4    Speed  69    Minutes  15    METs  5.6      Home Exercise Plan   Plans to continue exercise at  Home (comment) walking [place] track [place]    Frequency  Add 3 additional days to program exercise sessions. continue to walk    Initial Home Exercises Provided  10/03/17       Social History   Tobacco Use  Smoking Status Former Smoker  . Packs/day: 1.00  . Years: 40.00  . Pack years: 40.00  . Types: Cigarettes  . Last attempt to quit: 09/15/2007  . Years since quitting: 10.2  Smokeless Tobacco Never Used    Goals Met:  Independence with exercise equipment Exercise tolerated  well No report of cardiac concerns or symptoms Strength training completed today  Goals Unmet:  Not Applicable  Comments: Pt able to follow exercise prescription today without complaint.  Will continue to monitor for progression.   Dr. Emily Filbert is Medical Director for [place] and [name].

## 2017-12-27 DIAGNOSIS — H2513 Age-related nuclear cataract, bilateral: Secondary | ICD-10-CM | POA: Diagnosis not present

## 2017-12-27 DIAGNOSIS — M3501 Sicca syndrome with keratoconjunctivitis: Secondary | ICD-10-CM | POA: Diagnosis not present

## 2017-12-27 DIAGNOSIS — H04123 Dry eye syndrome of bilateral lacrimal glands: Secondary | ICD-10-CM | POA: Diagnosis not present

## 2017-12-28 DIAGNOSIS — J432 Centrilobular emphysema: Secondary | ICD-10-CM | POA: Diagnosis not present

## 2017-12-28 NOTE — Progress Notes (Signed)
Daily Session Note  Patient Details  Name: [name] MRN: [id number] Date of Birth: October 07, 1946 Referring Provider:     Pulmonary Rehab from 09/17/2017 in [facility] Cardiac and Pulmonary Rehab  Referring Provider  Simonne Maffucci MD      Encounter Date: 12/28/2017  Check In: Session Check In - 12/28/17 1119      Check-In   Location  [facility]-Cardiac & Pulmonary Rehab    Staff Present  Justin Mend RCP,RRT,BSRT;Meredith Sherryll Burger, RN BSN;Jessica Luan Pulling, MA, RCEP, CCRP, Exercise Physiologist    Supervising physician immediately available to respond to emergencies  [name] immediately available ER MD    Physician(s)  Dr. Jacqualine Code and Quentin Cornwall    Medication changes reported      No    Fall or balance concerns reported     No    Warm-up and Cool-down  Performed as group-led instruction    Resistance Training Performed  Yes    VAD Patient?  No      VAD patient   Has back up controller?  No      Pain Assessment   Currently in Pain?  No/denies          Social History   Tobacco Use  Smoking Status Former Smoker  . Packs/day: 1.00  . Years: 40.00  . Pack years: 40.00  . Types: Cigarettes  . Last attempt to quit: 09/15/2007  . Years since quitting: 10.2  Smokeless Tobacco Never Used    Goals Met:  Independence with exercise equipment Exercise tolerated well No report of cardiac concerns or symptoms Strength training completed today  Goals Unmet:  Not Applicable  Comments: Pt able to follow exercise prescription today without complaint.  Will continue to monitor for progression.   Dr. Emily Filbert is Medical Director for [place] and [name].

## 2017-12-29 ENCOUNTER — Other Ambulatory Visit: Payer: Self-pay | Admitting: Nurse Practitioner

## 2017-12-31 DIAGNOSIS — J432 Centrilobular emphysema: Secondary | ICD-10-CM

## 2017-12-31 NOTE — Progress Notes (Signed)
Daily Session Note  Patient Details  Name: AMARIAH KIERSTEAD MRN: [id number] Date of Birth: 10/21/1946 Referring Provider:     Pulmonary Rehab from 09/17/2017 in [facility] Cardiac and Pulmonary Rehab  Referring Provider  Simonne Maffucci MD      Encounter Date: 12/31/2017  Check In: Session Check In - 12/31/17 1123      Check-In   Location  [facility]-Cardiac & Pulmonary Rehab    Staff Present  Nada Maclachlan, BA, ACSM CEP, Exercise Physiologist;Kelly Amedeo Plenty, BS, ACSM CEP, Exercise Physiologist;Winola Drum Flavia Shipper    Supervising physician immediately available to respond to emergencies  [name] immediately available ER MD    Physician(s)  Jimmye Norman and Alfred Levins    Medication changes reported      No    Fall or balance concerns reported     No    Tobacco Cessation  No Change    Warm-up and Cool-down  Performed as group-led instruction    Resistance Training Performed  Yes    VAD Patient?  No      VAD patient   Has back up controller?  No      Pain Assessment   Currently in Pain?  No/denies          Social History   Tobacco Use  Smoking Status Former Smoker  . Packs/day: 1.00  . Years: 40.00  . Pack years: 40.00  . Types: Cigarettes  . Last attempt to quit: 09/15/2007  . Years since quitting: 10.3  Smokeless Tobacco Never Used    Goals Met:  Independence with exercise equipment Exercise tolerated well No report of cardiac concerns or symptoms Strength training completed today  Goals Unmet:  Not Applicable  Comments: Pt able to follow exercise prescription today without complaint.  Will continue to monitor for progression.   Dr. Emily Filbert is Medical Director for [place] and [name].

## 2017-12-31 NOTE — Telephone Encounter (Signed)
REFILL 

## 2018-01-02 VITALS — Ht 63.6 in | Wt 148.0 lb

## 2018-01-02 DIAGNOSIS — J432 Centrilobular emphysema: Secondary | ICD-10-CM | POA: Diagnosis not present

## 2018-01-02 NOTE — Progress Notes (Signed)
Daily Session Note  Patient Details  Name: [name] MRN: [id number] Date of Birth: 02-08-1946 Referring Provider:     Pulmonary Rehab from 09/17/2017 in [facility] Cardiac and Pulmonary Rehab  Referring Provider  Simonne Maffucci MD      Encounter Date: 01/02/2018  Check In: Session Check In - 01/02/18 1125      Check-In   Location  [facility]-Cardiac & Pulmonary Rehab    Staff Present  Renita Papa, RN BSN;Jessica Luan Pulling, MA, RCEP, CCRP, Exercise Physiologist;Joseph Flavia Shipper    Supervising physician immediately available to respond to emergencies  [name] immediately available ER MD    Physician(s)  Dr. Reita Cliche and Quentin Cornwall    Medication changes reported      No    Fall or balance concerns reported     No    Warm-up and Cool-down  Performed as group-led instruction    Resistance Training Performed  Yes    VAD Patient?  No      Pain Assessment   Currently in Pain?  No/denies          Social History   Tobacco Use  Smoking Status Former Smoker  . Packs/day: 1.00  . Years: 40.00  . Pack years: 40.00  . Types: Cigarettes  . Last attempt to quit: 09/15/2007  . Years since quitting: 10.3  Smokeless Tobacco Never Used    Goals Met:  Proper associated with RPD/PD & O2 Sat Independence with exercise equipment Using PLB without cueing & demonstrates good technique Exercise tolerated well Strength training completed today  Goals Unmet:  Not Applicable  Comments: Pt able to follow exercise prescription today without complaint.  Will continue to monitor for progression.  [place] Name 09/17/17 1605 01/02/18 1222       6 Minute Walk   Phase  Initial  Discharge    Distance  1400 feet  1650 feet    Distance % Change  -  17.8 %    Distance Feet Change  -  250 ft    Walk Time  6 minutes  6 minutes    # of Rest Breaks  0  0    MPH  2.65  3.13    METS  2.75  3.26    RPE  13  15    Perceived Dyspnea   2  3    VO2 Peak  9.63  11.41    Symptoms  Yes  (comment)  Yes (comment)    Comments  SOB  SOB    Resting HR  46 bpm  69 bpm    Resting BP  116/64  124/66    Resting Oxygen Saturation   100 %  98 %    Exercise Oxygen Saturation  during 6 min walk  94 %  88 %    Max Ex. HR  74 bpm  92 bpm    Max Ex. BP  144/60  126/64    2 Minute Post BP  132/70  120/60      Interval HR   1 Minute HR  64  70    2 Minute HR  74  - signal loss    3 Minute HR  - error  - signal loss    4 Minute HR  - error  71    5 Minute HR  - error  70    6 Minute HR  67  92    2 Minute Post HR  50  86    Interval Heart Rate?  Yes pulse oximeter stopped working halfway through walk test  Yes      Interval Oxygen   Interval Oxygen?  Yes  Yes    Baseline Oxygen Saturation %  100 %  98 %    1 Minute Oxygen Saturation %  100 %  91 %    1 Minute Liters of Oxygen  0 L Room Air  0 L Room Air    2 Minute Oxygen Saturation %  94 %  92 %    2 Minute Liters of Oxygen  0 L  0 L    3 Minute Oxygen Saturation %  - error  92 %    3 Minute Liters of Oxygen  0 L  0 L    4 Minute Oxygen Saturation %  - error  88 %    4 Minute Liters of Oxygen  0 L  0 L    5 Minute Oxygen Saturation %  - error  88 %    5 Minute Liters of Oxygen  0 L  0 L    6 Minute Oxygen Saturation %  94 %  89 %    6 Minute Liters of Oxygen  0 L  0 L    2 Minute Post Oxygen Saturation %  98 %  96 %    2 Minute Post Liters of Oxygen  0 L  0 L        Dr. Emily Filbert is Medical Director for [place] and [name].

## 2018-01-03 ENCOUNTER — Encounter: Payer: Self-pay | Admitting: Internal Medicine

## 2018-01-03 ENCOUNTER — Ambulatory Visit (INDEPENDENT_AMBULATORY_CARE_PROVIDER_SITE_OTHER): Payer: Medicare Other | Admitting: Internal Medicine

## 2018-01-03 VITALS — BP 130/70 | HR 62 | Ht 63.0 in | Wt 148.0 lb

## 2018-01-03 DIAGNOSIS — I2583 Coronary atherosclerosis due to lipid rich plaque: Secondary | ICD-10-CM

## 2018-01-03 DIAGNOSIS — R001 Bradycardia, unspecified: Secondary | ICD-10-CM

## 2018-01-03 DIAGNOSIS — I2510 Atherosclerotic heart disease of native coronary artery without angina pectoris: Secondary | ICD-10-CM | POA: Diagnosis not present

## 2018-01-03 DIAGNOSIS — I4892 Unspecified atrial flutter: Secondary | ICD-10-CM

## 2018-01-03 DIAGNOSIS — I480 Paroxysmal atrial fibrillation: Secondary | ICD-10-CM | POA: Diagnosis not present

## 2018-01-03 NOTE — Patient Instructions (Signed)
Medication Instructions: - Your physician recommends that you continue on your current medications as directed. Please refer to the Current Medication list given to you today.  Labwork: - none ordered  Procedures/Testing: - none ordered  Follow-Up: - Your physician wants you to follow-up in: 1 year with Dr. Klein. You will receive a reminder letter in the mail two months in advance. If you don't receive a letter, please call our office to schedule the follow-up appointment.   Any Additional Special Instructions Will Be Listed Below (If Applicable).     If you need a refill on your cardiac medications before your next appointment, please call your pharmacy.   
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
## 2018-01-04 ENCOUNTER — Other Ambulatory Visit: Payer: Self-pay | Admitting: Cardiovascular Disease

## 2018-01-04 DIAGNOSIS — J432 Centrilobular emphysema: Secondary | ICD-10-CM

## 2018-01-04 NOTE — Progress Notes (Signed)
Daily Session Note  Patient Details  Name: [name] MRN: [id number] Date of Birth: 1946-09-05 Referring Provider:     Pulmonary Rehab from 09/17/2017 in [facility] Cardiac and Pulmonary Rehab  Referring Provider  Simonne Maffucci MD      Encounter Date: 01/04/2018  Check In: Session Check In - 01/04/18 1147      Check-In   Location  [facility]-Cardiac & Pulmonary Rehab    Staff Present  Renita Papa, RN BSN;Lun Muro Darrin Nipper, [place], RCEP, [place], Exercise Physiologist    Supervising physician immediately available to respond to emergencies  [name] immediately available ER MD    Physician(s)  Dr. Jacqualine Code and [facility]    Medication changes reported      No    Fall or balance concerns reported     No    Warm-up and Cool-down  Performed as group-led instruction    Resistance Training Performed  Yes    VAD Patient?  No      VAD patient   Has back up controller?  No      Pain Assessment   Currently in Pain?  No/denies          Social History   Tobacco Use  Smoking Status Former Smoker  . Packs/day: 1.00  . Years: 40.00  . Pack years: 40.00  . Types: Cigarettes  . Last attempt to quit: 09/15/2007  . Years since quitting: 10.3  Smokeless Tobacco Never Used    Goals Met:  Independence with exercise equipment Exercise tolerated well No report of cardiac concerns or symptoms Strength training completed today  Goals Unmet:  Not Applicable  Comments: Pt able to follow exercise prescription today without complaint.  Will continue to monitor for progression.   Dr. Emily Filbert is Medical Director for [place] and [name].

## 2018-01-07 ENCOUNTER — Ambulatory Visit (INDEPENDENT_AMBULATORY_CARE_PROVIDER_SITE_OTHER): Payer: Medicare Other | Admitting: Pharmacist

## 2018-01-07 ENCOUNTER — Other Ambulatory Visit: Payer: Self-pay | Admitting: Nurse Practitioner

## 2018-01-07 DIAGNOSIS — J432 Centrilobular emphysema: Secondary | ICD-10-CM

## 2018-01-07 DIAGNOSIS — I480 Paroxysmal atrial fibrillation: Secondary | ICD-10-CM | POA: Diagnosis not present

## 2018-01-07 DIAGNOSIS — Z5181 Encounter for therapeutic drug level monitoring: Secondary | ICD-10-CM | POA: Diagnosis not present

## 2018-01-07 LAB — POCT INR: INR: 1.8

## 2018-01-07 NOTE — Progress Notes (Signed)
Daily Session Note  Patient Details  Name: TWANIA BUJAK MRN: [id number] Date of Birth: 1945/11/23 Referring Provider:     Pulmonary Rehab from 09/17/2017 in [facility] Cardiac and Pulmonary Rehab  Referring Provider  Simonne Maffucci MD      Encounter Date: 01/07/2018  Check In: Session Check In - 01/07/18 1136      Check-In   Location  [facility]-Cardiac & Pulmonary Rehab    Staff Present  Earlean Shawl, BS, ACSM CEP, Exercise Physiologist;Amanda Oletta Darter, BA, ACSM CEP, Exercise Physiologist;Isidora Laham Flavia Shipper    Supervising physician immediately available to respond to emergencies  [name] immediately available ER MD    Physician(s)  Dr. Reita Cliche and Mariea Clonts    Medication changes reported      No    Fall or balance concerns reported     No    Warm-up and Cool-down  Performed as group-led instruction    Resistance Training Performed  Yes    VAD Patient?  No      VAD patient   Has back up controller?  No      Pain Assessment   Currently in Pain?  No/denies          Social History   Tobacco Use  Smoking Status Former Smoker  . Packs/day: 1.00  . Years: 40.00  . Pack years: 40.00  . Types: Cigarettes  . Last attempt to quit: 09/15/2007  . Years since quitting: 10.3  Smokeless Tobacco Never Used    Goals Met:  Proper associated with RPD/PD & O2 Sat Independence with exercise equipment Exercise tolerated well No report of cardiac concerns or symptoms Strength training completed today  Goals Unmet:  Not Applicable  Comments:  Cadynce graduated today from  rehab with 36 sessions completed.  Details of the patient's exercise prescription and what She needs to do in order to continue the prescription and progress were discussed with patient.  Patient was given a copy of prescription and goals.  Patient verbalized understanding.  Truc plans to continue to exercise by walking and using the elliptical at home.   Dr. Emily Filbert is Medical Director for [place] and [name].

## 2018-01-07 NOTE — Progress Notes (Signed)
Discharge Progress Report  Patient Details  Name: [name] MRN: [id number] Date of Birth: 06/11/1946 Referring Provider:     Pulmonary Rehab from 09/17/2017 in [facility]  Referring Provider  Simonne Maffucci MD       Number of Visits: 36/36  Reason for Discharge:  Patient reached a stable level of exercise. Patient independent in their exercise. Patient has met program and personal goals.  Smoking History:  Social History   Tobacco Use  Smoking Status Former Smoker  . Packs/day: 1.00  . Years: 40.00  . Pack years: 40.00  . Types: Cigarettes  . Last attempt to quit: 09/15/2007  . Years since quitting: 10.3  Smokeless Tobacco Never Used    Diagnosis:  Centrilobular emphysema ([place])  ADL [facility]: Pulmonary Assessment Scores    Row Name 09/17/17 1450 11/19/17 1145 12/26/17 1131     ADL [facility]   ADL Phase  Entry  Mid  Exit   SOB Score total  51  48  23   Rest  0  0  0   Walk  3  2  0   Stairs  '4  4  2   '$ Bath  0  0  0   Dress  0  0  0   Shop  2  2  0     CAT Score   CAT Score  12  -  13     mMRC Score   mMRC Score  1  -  -   Row Name 01/02/18 1225         ADL [facility]   ADL Phase  Exit       mMRC Score   mMRC Score  0.5        Initial Exercise Prescription: Initial Exercise Prescription - 09/17/17 1600      Date of Initial Exercise RX and Referring Provider   Date  09/17/17    Referring Provider  Simonne Maffucci MD      Treadmill   MPH  2    Grade  0    Minutes  15    METs  2.67      NuStep   Level  1    SPM  80    Minutes  15    METs  2.5      REL-XR   Level  1    Speed  50    Minutes  15    METs  2.5      Prescription Details   Frequency (times per week)  3    Duration  Progress to 45 minutes of aerobic exercise without signs/symptoms of physical distress      Intensity   THRR 40-80% of Max Heartrate  86-127    Ratings of Perceived Exertion  11-13    Perceived Dyspnea  0-4      Progression    Progression  Continue to progress workloads to maintain intensity without signs/symptoms of physical distress.      Resistance Training   Training Prescription  Yes    Weight  3 lbs    Reps  10-15       Discharge Exercise Prescription (Final Exercise Prescription Changes): Exercise Prescription Changes - 12/25/17 1400      Response to Exercise   Blood Pressure (Admit)  120/72    Blood Pressure (Exit)  116/66    Heart Rate (Admit)  69 bpm    Heart Rate (Exercise)  94 bpm  Heart Rate (Exit)  89 bpm    Oxygen Saturation (Admit)  97 %    Oxygen Saturation (Exercise)  95 %    Oxygen Saturation (Exit)  98 %    Rating of Perceived Exertion (Exercise)  15    Perceived Dyspnea (Exercise)  3    Symptoms  none    Duration  Continue with 45 min of aerobic exercise without signs/symptoms of physical distress.    Intensity  THRR unchanged      Progression   Progression  Continue to progress workloads to maintain intensity without signs/symptoms of physical distress.    Average METs  4.34      Resistance Training   Training Prescription  Yes    Weight  4 lbs    Reps  10-15      Interval Training   Interval Training  No      Treadmill   MPH  2.9    Grade  0.5    Minutes  15    METs  3.42      NuStep   Level  4    SPM  107    Minutes  15    METs  4      REL-XR   Level  4    Speed  69    Minutes  15    METs  5.6      Home Exercise Plan   Plans to continue exercise at  Home (comment) walking at track and mall    Frequency  Add 3 additional days to program exercise sessions. continue to walk    Initial Home Exercises Provided  10/03/17       Functional Capacity: 6 Minute Walk    Row Name 09/17/17 1605 01/02/18 1222       6 Minute Walk   Phase  Initial  Discharge    Distance  1400 feet  1650 feet    Distance % Change  -  17.8 %    Distance Feet Change  -  250 ft    Walk Time  6 minutes  6 minutes    # of Rest Breaks  0  0    MPH  2.65  3.13    METS  2.75   3.26    RPE  13  15    Perceived Dyspnea   2  3    VO2 Peak  9.63  11.41    Symptoms  Yes (comment)  Yes (comment)    Comments  SOB  SOB    Resting HR  46 bpm  69 bpm    Resting BP  116/64  124/66    Resting Oxygen Saturation   100 %  98 %    Exercise Oxygen Saturation  during 6 min walk  94 %  88 %    Max Ex. HR  74 bpm  92 bpm    Max Ex. BP  144/60  126/64    2 Minute Post BP  132/70  120/60      Interval HR   1 Minute HR  64  70    2 Minute HR  74  - signal loss    3 Minute HR  - error  - signal loss    4 Minute HR  - error  71    5 Minute HR  - error  70    6 Minute HR  67  92    2 Minute Post HR  50  86    Interval Heart Rate?  Yes pulse oximeter stopped working halfway through walk test  Yes      Interval Oxygen   Interval Oxygen?  Yes  Yes    Baseline Oxygen Saturation %  100 %  98 %    1 Minute Oxygen Saturation %  100 %  91 %    1 Minute Liters of Oxygen  0 L Room Air  0 L Room Air    2 Minute Oxygen Saturation %  94 %  92 %    2 Minute Liters of Oxygen  0 L  0 L    3 Minute Oxygen Saturation %  - error  92 %    3 Minute Liters of Oxygen  0 L  0 L    4 Minute Oxygen Saturation %  - error  88 %    4 Minute Liters of Oxygen  0 L  0 L    5 Minute Oxygen Saturation %  - error  88 %    5 Minute Liters of Oxygen  0 L  0 L    6 Minute Oxygen Saturation %  94 %  89 %    6 Minute Liters of Oxygen  0 L  0 L    2 Minute Post Oxygen Saturation %  98 %  96 %    2 Minute Post Liters of Oxygen  0 L  0 L       Psychological, QOL, Others - Outcomes: PHQ 2/9: Depression screen [facility] 2/9 12/26/2017 09/17/2017  Decreased Interest 0 0  Down, Depressed, Hopeless 0 0  PHQ - 2 Score 0 0  Altered sleeping 0 0  Tired, decreased energy 1 1  Change in appetite 0 0  Feeling bad or failure about yourself  0 0  Trouble concentrating 0 1  Moving slowly or fidgety/restless 0 0  Suicidal thoughts 0 0  PHQ-9 Score 1 2  Difficult doing work/chores Not difficult at all Not difficult at all   Some recent data might be hidden    Quality of Life:   Personal Goals: Goals established at orientation with interventions provided to work toward goal. Personal Goals and Risk Factors at Admission - 09/17/17 1507      Core Components/Risk Factors/Patient Goals on Admission    Weight Management  Yes;Weight Loss    Intervention  Weight Management: Develop a combined nutrition and exercise program designed to reach desired caloric intake, while maintaining appropriate intake of nutrient and fiber, sodium and fats, and appropriate energy expenditure required for the weight goal.;Weight Management: Provide education and appropriate resources to help participant work on and attain dietary goals.;Weight Management/Obesity: Establish reasonable short term and long term weight goals.    Admit Weight  148 lb 8 oz (67.4 kg)    Goal Weight: Short Term  141 lb (64 kg)    Goal Weight: Long Term  136 lb (61.7 kg)    Expected Outcomes  Short Term: Continue to assess and modify interventions until short term weight is achieved;Long Term: Adherence to nutrition and physical activity/exercise program aimed toward attainment of established weight goal;Weight Maintenance: Understanding of the daily nutrition guidelines, which includes 25-35% calories from fat, 7% or less cal from saturated fats, less than '200mg'$  cholesterol, less than 1.5gm of sodium, & 5 or more servings of fruits and vegetables daily;Weight Loss: Understanding of general recommendations for a balanced deficit meal plan, which promotes 1-2 lb weight loss per  week and includes a negative energy balance of [phone] kcal/d;Understanding recommendations for meals to include 15-35% energy as protein, 25-35% energy from fat, 35-60% energy from carbohydrates, less than '200mg'$  of dietary cholesterol, 20-35 gm of total fiber daily;Understanding of distribution of calorie intake throughout the day with the consumption of 4-5 meals/snacks    Improve shortness of  breath with ADL's  Yes    Intervention  Provide education, individualized exercise plan and daily activity instruction to help decrease symptoms of SOB with activities of daily living.    Expected Outcomes  Short Term: Achieves a reduction of symptoms when performing activities of daily living.    Heart Failure  Yes    Intervention  Provide a combined exercise and nutrition program that is supplemented with education, support and counseling about heart failure. Directed toward relieving symptoms such as shortness of breath, decreased exercise tolerance, and extremity edema.    Expected Outcomes  Improve functional capacity of life;Short term: Attendance in program 2-3 days a week with increased exercise capacity. Reported lower sodium intake. Reported increased fruit and vegetable intake. Reports medication compliance.;Short term: Daily weights obtained and reported for increase. Utilizing diuretic protocols set by physician.;Long term: Adoption of self-care skills and reduction of barriers for early signs and symptoms recognition and intervention leading to self-care maintenance.    Hypertension  Yes    Intervention  Provide education on lifestyle modifcations including regular physical activity/exercise, weight management, moderate sodium restriction and increased consumption of fresh fruit, vegetables, and low fat dairy, alcohol moderation, and smoking cessation.;Monitor prescription use compliance.    Expected Outcomes  Long Term: Maintenance of blood pressure at goal levels.;Short Term: Continued assessment and intervention until BP is < 140/35m HG in hypertensive participants. < 130/841mHG in hypertensive participants with diabetes, heart failure or chronic kidney disease.    Lipids  Yes takes medication    Intervention  Provide education and support for participant on nutrition & aerobic/resistive exercise along with prescribed medications to achieve LDL '70mg'$ , HDL >'40mg'$ .    Expected Outcomes   Long Term: Cholesterol controlled with medications as prescribed, with individualized exercise RX and with personalized nutrition plan. Value goals: LDL < '70mg'$ , HDL > 40 mg.;Short Term: Participant states understanding of desired cholesterol values and is compliant with medications prescribed. Participant is following exercise prescription and nutrition guidelines.        Personal Goals Discharge: Goals and Risk Factor Review    Row Name 10/19/17 1217 11/09/17 1146 12/19/17 1150 01/04/18 1232       Core Components/Risk Factors/Patient Goals Review   Personal Goals Review  Improve shortness of breath with ADL's;Lipids;Hypertension;Heart Failure;Stress;Weight Management/Obesity  Improve shortness of breath with ADL's;Hypertension;Lipids;Heart Failure;Weight Management/Obesity;Stress  Improve shortness of breath with ADL's;Hypertension;Lipids;Heart Failure;Weight Management/Obesity  Improve shortness of breath with ADL's;Hypertension;Lipids;Heart Failure;Weight Management/Obesity    Review  [name]'s Lipids back in October where within normal limits. She is taking her medication as directed for her cholesterol. Her blood pressure has improved slightly since the begining of the program. She feels like her strength has improved and has vacuumed recently and she said she did not get as short of breath.  [name] still taking her cholesterol medicine and eating healthier to improve numbers. Her blood pressure and heart rate are doing well. Her stamina and strength have greatly improved since the beginning of the program. Stress level has improved. She has been taking B6 and limiting sodium to help her heart failure symptoms.   [name] doing well with  her weight.  She fluctuates between 147-150 lbs.  She weighs daily and has her plan set if she goes over her normal.  She has continued to watch her sodium intake and has not had any symptoms of her heart failure.  She had a follow with both cardiologist and  pulmonolgist.  Heart is good and she has another CT scan of her lungs scheduled in a few weeks.  Breathing is getting better and her saturations have been good.  She is able to do more at home without SOB.  Her strength has helped and she impressed her daughter moving around chairs. [name]'s blood pressures have been good  and she has continued to check it at home.  She has not had a lipid panel checked but they did increase her simastatin.   She is planning to conintue to monitor rhythm and blood pressure.  She has been able to do more at home with less SOB.  She continues to try to build more speed while doing more.  Her weight has been steady.  No heart failure symptoms    Expected Outcomes  Short: continue to attend [name] Regularly. Long: Improve blood pressure readings further.  Short: continue to attend rehab and education. Long: continue to improve SOB and stamina, take what she has learned and apply at home.   Short: Continue to stay on top of weight and heart failure.  Long: Continue to get stronger and improved SOB.   Short: Continue to stay on top of heart failure. Long: Continue to work on risk factors.        Exercise Goals and Review: Exercise Goals    Row Name 09/17/17 1610             Exercise Goals   Increase Physical Activity  Yes       Intervention  Provide advice, education, support and counseling about physical activity/exercise needs.;Develop an individualized exercise prescription for aerobic and resistive training based on initial evaluation findings, risk stratification, comorbidities and participant's personal goals.       Expected Outcomes  Achievement of increased cardiorespiratory fitness and enhanced flexibility, muscular endurance and strength shown through measurements of functional capacity and personal statement of participant.       Increase Strength and Stamina  Yes       Intervention  Provide advice, education, support and counseling about physical  activity/exercise needs.;Develop an individualized exercise prescription for aerobic and resistive training based on initial evaluation findings, risk stratification, comorbidities and participant's personal goals.       Expected Outcomes  Achievement of increased cardiorespiratory fitness and enhanced flexibility, muscular endurance and strength shown through measurements of functional capacity and personal statement of participant.       Able to understand and use rate of perceived exertion (RPE) scale  Yes       Intervention  Provide education and explanation on how to use RPE scale       Expected Outcomes  Short Term: Able to use RPE daily in rehab to express subjective intensity level;Long Term:  Able to use RPE to guide intensity level when exercising independently       Able to understand and use Dyspnea scale  Yes       Intervention  Provide education and explanation on how to use Dyspnea scale       Expected Outcomes  Short Term: Able to use Dyspnea scale daily in rehab to express subjective sense of shortness of breath during exertion;Long Term:  Able to use Dyspnea scale to guide intensity level when exercising independently       Knowledge and understanding of Target Heart Rate Range (THRR)  Yes       Intervention  Provide education and explanation of THRR including how the numbers were predicted and where they are located for reference       Expected Outcomes  Short Term: Able to state/look up THRR;Long Term: Able to use THRR to govern intensity when exercising independently;Short Term: Able to use daily as guideline for intensity in rehab       Able to check pulse independently  Yes       Intervention  Provide education and demonstration on how to check pulse in carotid and radial arteries.;Review the importance of being able to check your own pulse for safety during independent exercise       Expected Outcomes  Short Term: Able to explain why pulse checking is important during independent  exercise;Long Term: Able to check pulse independently and accurately       Understanding of Exercise Prescription  Yes       Intervention  Provide education, explanation, and written materials on patient's individual exercise prescription       Expected Outcomes  Long Term: Able to explain home exercise prescription to exercise independently;Short Term: Able to explain program exercise prescription          Nutrition & Weight - Outcomes: Pre Biometrics - 09/17/17 1611      Pre Biometrics   Height  5' 3.6" (1.615 m)    Weight  148 lb 8 oz (67.4 kg)    Waist Circumference  32.25 inches    Hip Circumference  41.25 inches    Waist to Hip Ratio  0.78 %    BMI (Calculated)  25.83      Post Biometrics - 01/02/18 1224       Post  Biometrics   Height  5' 3.6" (1.615 m)    Weight  148 lb (67.1 kg)    Waist Circumference  30 inches    Hip Circumference  39 inches    Waist to Hip Ratio  0.77 %    BMI (Calculated)  25.74       Nutrition: Nutrition Therapy & Goals - 10/19/17 1209      Nutrition Therapy   RD appointment deferred  Yes       Nutrition Discharge: Nutrition Assessments - 12/26/17 1134      MEDFICTS Scores   Pre Score  13       Education Questionnaire Score: Knowledge Questionnaire Score - 12/26/17 1131      Knowledge Questionnaire Score   Pre Score  17/18    Post Score  15/18 REVIEWED with patient       Goals reviewed with patient; copy given to patient.

## 2018-01-07 NOTE — Patient Instructions (Signed)
Description   Take 2 tablets today, then continue on same dosage 1 tablet daily except 1.5 tablet on Tuesdays and Thursdays.  Recheck in 3 weeks.

## 2018-01-07 NOTE — Progress Notes (Signed)
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       Intervention  Provide education and explanation on how to use Dyspnea scale       Expected Outcomes  Short Term: Able to use Dyspnea scale daily in rehab to express subjective sense of shortness of breath during exertion;Long Term: Able to use Dyspnea scale to guide intensity level when exercising independently       Knowledge and understanding of Target Heart Rate Range (THRR)  Yes       Intervention  Provide education and explanation of THRR including how the numbers were predicted and where they are located for reference       Expected Outcomes  Short Term: Able to state/look up THRR;Long Term: Able to use THRR to govern intensity when exercising independently;Short Term: Able to use daily as guideline for intensity in rehab       Able to check pulse independently  Yes  Intervention  Provide education and demonstration on how to check pulse in carotid and  radial arteries.;Review the importance of being able to check your own pulse for safety during independent exercise       Expected Outcomes  Short Term: Able to explain why pulse checking is important during independent exercise;Long Term: Able to check pulse independently and accurately       Understanding of Exercise Prescription  Yes       Intervention  Provide education, explanation, and written materials on patient's individual exercise prescription       Expected Outcomes  Long Term: Able to explain home exercise prescription to exercise independently;Short Term: Able to explain program exercise prescription          Exercise Goals Re-Evaluation : Exercise Goals Re-Evaluation    Row Name 09/21/17 1150 10/01/17 1630 10/03/17 1235 10/16/17 1530 10/31/17 1423     Exercise Goal Re-Evaluation   Exercise Goals Review  Understanding of Exercise Prescription;Knowledge and understanding of Target Heart Rate Range (THRR);Able to understand and use Dyspnea scale;Able to understand and use rate of perceived exertion (RPE) scale  Increase Physical Activity;Increase Strength and Stamina;Understanding of Exercise Prescription  Increase Physical Activity;Understanding of Exercise Prescription;Knowledge and understanding of Target Heart Rate Range (THRR);Able to understand and use rate of perceived exertion (RPE) scale;Able to check pulse independently  Increase Physical Activity;Increase Strength and Stamina;Understanding of Exercise Prescription  Increase Physical Activity;Increase Strength and Stamina;Understanding of Exercise Prescription   Comments  Reviewed RPE scale, THR and program prescription with pt today.  Pt voiced understanding and was given a copy of goals to take home.   Chelcey is off to a good start with rehab.  She has been doing well and attending only two days a week.  She has already moved up to level two on the NuStep.  Will continue to monitor her progress.   Reviewed home exercise with pt  today.  Pt plans to continue walking at the track and mall for exercise.  She is already exercising daily but need to work on increasing to 30 minutes of continuous cardio. Michaelle is going to start adding a minute each time she walks.  Reviewed THR, pulse, RPE, sign and symptoms, and when to call 911 or MD.  Also discussed weather considerations and indoor options.  Pt voiced understanding.  Amber has been doing well in rehab.  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  She has been out since 11/26 but hopes to return tomorrow.  She is planning to increase her speed on the treadmill at her next visit.  We will continue to monitor her progress.   Princes is doing well in rehab.  She is now up to 2.5 mph on the treadmill.  We will continue to monitor her progression.    Expected Outcomes  Short: Use RPE daily to regulate intensity.  Long: Follow program prescription in THR.  Short: Move up workloads.  Long: Continue to increase exercise at home.   Short: Start increasing time to 30 min by adding in one minute each time she goes.  Long: Continue to exercise independently  Short: Return to rehab and be able to pick back up to workloads again.  Long: Continue to work on increasing exercise time at home.   Short: Increase workloads on NuStep and XR.  Long: Continue to exercise at home.    [place] Name 11/14/17 1408 11/27/17 1605 12/12/17 1504 12/19/17 1148 12/25/17 1416  Exercise Goal Re-Evaluation   Exercise Goals Review  Increase Physical Activity;Increase Strength and Stamina;Understanding of Exercise Prescription  Increase Physical Activity;Increase Strength and Stamina;Understanding of Exercise Prescription  Increase Physical Activity;Increase Strength and Stamina;Understanding of Exercise Prescription  Increase Physical Activity;Increase Strength and Stamina;Understanding of Exercise Prescription  Increase Physical Activity;Increase Strength and Stamina;Understanding of Exercise  Prescription   Comments  Delaine continues to do well in rehab.  Today, she came in wearing a boot as her ankle had still been hurting. She has an appointment with an orthopedic doctor tomorrow to discuss her injury and exercise going forward. We will wait to see what the doctor advises tomorrow.   Bryelle has been doing well in rehab.  She wears her boot into class and switches to a shoe with a brace for exercise.  She says that it is starting to feel better.  She is now on level 3 on the XR!  We will continue to monitor her progression.   Stefani continues to do well in rehab.  She is stilling wearing her boot and has started to do some PT with her foot as well.  Today, she asked to hold off on the recumbent bike as she felt that it is bothering her ankle.  We will continue to monitor her progress.   Sadia now is exercising without the brace and is out the boot!!  She was able to do the treadmill earlier this week and walked on the treadmill at home at 3.0 mph.  It is feeling better and overall she is feeling better as well with more strength and stamina.   Carriann is starting to near graduation.  We will be doing her post 6MWT next week!  She is back on the treadmill full time and tolerating it well.  We will continue to monitor her progress.    Expected Outcomes  Short: If able to exercise, continue to work on increasing workloads.  Long: Continue to exercise at home on off days.  Short: Try to increase workload on arm ergometer.  Long: Continue to exercise independently.   Short: Try to find a third piece of equipment that will work long term for her possibly try the BioStep.  Long: Continue to exercise independently.   Short: Back on treadmill!!  Long: Continue to exercise independently.   Short: Improve post 6MWT  Long: Continue to exercise after graduation   [place] Name 01/04/18 1230             Exercise Goal Re-Evaluation   Comments  Aleyna will be graduating on Monday.  She is planning to join  the [facility] as it is just a mile from home.  She is trying to figure out her routine.  Possibly 3 days a week at the [facility] and 4 days on the track. She wants to continue to do her ankle exericses as well.       Expected Outcomes  Short: Graduate  Long: Continue to exercise at [facility]          Discharge Exercise Prescription (Final Exercise Prescription Changes): Exercise Prescription Changes - 12/25/17 1400      Response to Exercise   Blood Pressure (Admit)  120/72    Blood Pressure (Exit)  116/66    Heart Rate (Admit)  69 bpm    Heart Rate (Exercise)  94 bpm    Heart Rate (Exit)  89 bpm    Oxygen Saturation (Admit)  97 %    Oxygen Saturation (Exercise)  95 %  Oxygen Saturation (Exit)  98 %    Rating of Perceived Exertion (Exercise)  15    Perceived Dyspnea (Exercise)  3    Symptoms  none    Duration  Continue with 45 min of aerobic exercise without signs/symptoms of physical distress.    Intensity  THRR unchanged      Progression   Progression  Continue to progress workloads to maintain intensity without signs/symptoms of physical distress.    Average METs  4.34      Resistance Training   Training Prescription  Yes    Weight  4 lbs    Reps  10-15      Interval Training   Interval Training  No      Treadmill   MPH  2.9    Grade  0.5    Minutes  15    METs  3.42      NuStep   Level  4    SPM  107    Minutes  15    METs  4      REL-XR   Level  4    Speed  69    Minutes  15    METs  5.6      Home Exercise Plan   Plans to continue exercise at  Home (comment) walking at track and mall    Frequency  Add 3 additional days to program exercise sessions. continue to walk    Initial Home Exercises Provided  10/03/17       Nutrition:  Target Goals: Understanding of nutrition guidelines, daily intake of sodium <1553m, cholesterol <2045m calories 30% from fat and 7% or less from saturated fats, daily to have 5 or more servings of fruits and vegetables.  Biometrics: Pre  Biometrics - 09/17/17 1611      Pre Biometrics   Height  5' 3.6" (1.615 m)    Weight  148 lb 8 oz (67.4 kg)    Waist Circumference  32.25 inches    Hip Circumference  41.25 inches    Waist to Hip Ratio  0.78 %    BMI (Calculated)  25.83      Post Biometrics - 01/02/18 1224       Post  Biometrics   Height  5' 3.6" (1.615 m)    Weight  148 lb (67.1 kg)    Waist Circumference  30 inches    Hip Circumference  39 inches    Waist to Hip Ratio  0.77 %    BMI (Calculated)  25.74       Nutrition Therapy Plan and Nutrition Goals: Nutrition Therapy & Goals - 10/19/17 1209      Nutrition Therapy   RD appointment deferred  Yes       Nutrition Assessments: Nutrition Assessments - 12/26/17 1134      MEDFICTS Scores   Pre Score  13       Nutrition Goals Re-Evaluation: Nutrition Goals Re-Evaluation    Row Name 10/19/17 1210 11/09/17 1154 12/10/17 1245         Goals   Current Weight  150 lb (68 kg)  147 lb 1.6 oz (66.7 kg)  150 lb (68 kg)     Nutrition Goal  Eat a heart healthy diet.  Continue to eat a heart healthy, low sodium diet  lose some weight.     Comment  She has deferred the dietician appointment at this time. When she had her heart attack she has changed her eating  habits 10 years ago.  She came to the nutrition classes but does not want to meet with a dietician one on one at this time. She feels comfortable reading food labels and understanding what is good for her diagnosis   She mostly follows a heart healthy diet. She has not ate any beef for 10 years. She admits to eating banana bread and angel food cake so she had put on a couple pounds.     Expected Outcome  Short: lose a few pounds. Long: eat healthy and exercise to maintain weight loss  Short: continue to lose a few pounds to get to goal wait of 140lb. Long: continue to eat healthy and understand diet recommendations for diagnosis   Short: work on losing a few pounds. Long: lose and maintain weight independently         Nutrition Goals Discharge (Final Nutrition Goals Re-Evaluation): Nutrition Goals Re-Evaluation - 12/10/17 1245      Goals   Current Weight  150 lb (68 kg)    Nutrition Goal  lose some weight.    Comment  She mostly follows a heart healthy diet. She has not ate any beef for 10 years. She admits to eating banana bread and angel food cake so she had put on a couple pounds.    Expected Outcome  Short: work on losing a few pounds. Long: lose and maintain weight independently       Psychosocial: Target Goals: Acknowledge presence or absence of significant depression and/or stress, maximize coping skills, provide positive support system. Participant is able to verbalize types and ability to use techniques and skills needed for reducing stress and depression.   Initial Review & Psychosocial Screening: Initial Psych Review & Screening - 09/17/17 1502      Initial Review   Current issues with  None Identified      Family Dynamics   Good Support System?  Yes    Comments  her two kids and grandchildren are great for support      Barriers   Psychosocial barriers to participate in program  There are no identifiable barriers or psychosocial needs.;The patient should benefit from training in stress management and relaxation.      Screening Interventions   Interventions  Encouraged to exercise;Program counselor consult;Provide feedback about the scores to participant;To provide support and resources with identified psychosocial needs;Yes    Expected Outcomes  Short Term goal: Utilizing psychosocial counselor, staff and physician to assist with identification of specific Stressors or current issues interfering with healing process. Setting desired goal for each stressor or current issue identified.;Long Term Goal: Stressors or current issues are controlled or eliminated.;Short Term goal: Identification and review with participant of any Quality of Life or Depression concerns found by scoring the  questionnaire.;Long Term goal: The participant improves quality of Life and PHQ9 Scores as seen by post scores and/or verbalization of changes       Quality of Life Scores:  Scores of 19 and below usually indicate a poorer quality of life in these areas.  A difference of  2-3 points is a clinically meaningful difference.  A difference of 2-3 points in the total score of the Quality of Life Index has been associated with significant improvement in overall quality of life, self-image, physical symptoms, and general health in studies assessing change in quality of life.  PHQ-9: Recent Review Flowsheet Data    Depression screen [facility] 2/9 12/26/2017 09/17/2017   Decreased Interest 0 0   Down, Depressed,  Hopeless 0 0   PHQ - 2 Score 0 0   Altered sleeping 0 0   Tired, decreased energy 1 1   Change in appetite 0 0   Feeling bad or failure about yourself  0 0   Trouble concentrating 0 1   Moving slowly or fidgety/restless 0 0   Suicidal thoughts 0 0   PHQ-9 Score 1 2   Difficult doing work/chores Not difficult at all Not difficult at all     Interpretation of Total Score  Total Score Depression Severity:  1-4 = Minimal depression, 5-9 = Mild depression, 10-14 = Moderate depression, 15-19 = Moderately severe depression, 20-27 = Severe depression   Psychosocial Evaluation and Intervention: Psychosocial Evaluation - 10/29/17 1251      Psychosocial Evaluation & Interventions   Interventions  Encouraged to exercise with the program and follow exercise prescription;Relaxation education;Stress management education    Comments  Counselor met with Ms. [name]) today for initial psychosocial evaluation.  She is a 72 year old who had lower left lob lumpectomy for lung cancer this past August.  She has a strong support system with a daughter and grandchildren close by and active involvement in her local church.  Jazalynn has had multiple health issues over the past 10 years with CABGx3 in 2008; a  ruptured colon in 2015; and subsequent hernea repairs after that.  She sleeps well and has a good appetite.  Windi denies a history of depression or anxiety and states her mood is generally  positive most of the time.  Debora says her health is her primary stressor.  She has goals to learn to cope with her SOB and to increase her stamina and strength while in this program.  She also would like to get back to walking 2 miles a day as she was prior to her last Surgery in August.      Expected Outcomes  Jorryn will benefit from consistent exercise to achieve her stated goals.  The educational and psychoeducational components will be helpful in learning more about her condition and coping more positively.      Continue Psychosocial Services   Follow up required by staff       Psychosocial Re-Evaluation: Psychosocial Re-Evaluation    [place] Name 10/19/17 1213 11/09/17 1157 12/19/17 1200         Psychosocial Re-Evaluation   Current issues with  Current Stress Concerns  Current Stress Concerns  Current Stress Concerns     Comments  She gets stressed out when she is short of breath. She has an ankle injury that has slowed her down recently but is working around it. Modelle is happy in general. She has projects to work on that she is behind on due to her surgeries in the past .  She reports that her stress level is down even more since staff last checked in, even in spite of the holidays. She is looking forward to a new year with hopefully minimal health issues.   Kary has been doing well in rehab.  She has started a new inhaler and she is nervous about it flipping her into afib. She has been doing well overall.  She is out of her boot and ready to move more.  She is planning to get out on the golf course again.  She has been enjoying the nice weather that we are having and ready to get back outside and be active again.  Her biggest problem has been that she  is still sore from the drainage tubes.  Overall  she feels good!!  She is nearing graduation!     Expected Outcomes  Short: continue to exercise in [name]. Long: Maintain an exercise routine to keep stress minimized.  Short: continue to exercise in [name]. Long: Maintain an exercise routine and apply lessons from class on deep breathing and destressing.   Short: Continue to maintain positive attitude and get outside again.  Long: Enjoying being out and about again.      Interventions  Encouraged to attend Pulmonary Rehabilitation for the exercise  Encouraged to attend Pulmonary Rehabilitation for the exercise  Encouraged to attend Pulmonary Rehabilitation for the exercise     Continue Psychosocial Services   Follow up required by staff  Follow up required by staff  Follow up required by staff        Psychosocial Discharge (Final Psychosocial Re-Evaluation): Psychosocial Re-Evaluation - 12/19/17 1200      Psychosocial Re-Evaluation   Current issues with  Current Stress Concerns    Comments  Beckie has been doing well in rehab.  She has started a new inhaler and she is nervous about it flipping her into afib. She has been doing well overall.  She is out of her boot and ready to move more.  She is planning to get out on the golf course again.  She has been enjoying the nice weather that we are having and ready to get back outside and be active again.  Her biggest problem has been that she is still sore from the drainage tubes.  Overall she feels good!!  She is nearing graduation!    Expected Outcomes  Short: Continue to maintain positive attitude and get outside again.  Long: Enjoying being out and about again.     Interventions  Encouraged to attend Pulmonary Rehabilitation for the exercise    Continue Psychosocial Services   Follow up required by staff       Education: Education Goals: Education classes will be provided on a weekly basis, covering required topics. Participant will state understanding/return demonstration of topics  presented.  Learning Barriers/Preferences: Learning Barriers/Preferences - 09/17/17 1504      Learning Barriers/Preferences   Learning Barriers  Sight wears Glasses    Learning Preferences  None       Education Topics:  Initial Evaluation Education: - Verbal, written and demonstration of respiratory meds, oximetry and breathing techniques. Instruction on use of nebulizers and MDIs and importance of monitoring MDI activations.   Pulmonary Rehab from 01/02/2018 in [facility] Cardiac and Pulmonary Rehab  Date  09/17/17  Educator  [facility]  Instruction Review Code  1- Verbalizes Understanding      General Nutrition Guidelines/Fats and Fiber: -Group instruction provided by verbal, written material, models and posters to present the general guidelines for heart healthy nutrition. Gives an explanation and review of dietary fats and fiber.   Pulmonary Rehab from 01/02/2018 in [facility] Cardiac and Pulmonary Rehab  Date  12/10/17  Educator  CR  Instruction Review Code  1- Verbalizes Understanding      Controlling Sodium/Reading Food Labels: -Group verbal and written material supporting the discussion of sodium use in heart healthy nutrition. Review and explanation with models, verbal and written materials for utilization of the food label.   Pulmonary Rehab from 01/02/2018 in [facility] Cardiac and Pulmonary Rehab  Date  12/17/17  Educator  CR  Instruction Review Code  1- Verbalizes Understanding      Exercise Physiology & General Exercise  Guidelines: - Group verbal and written instruction with models to review the exercise physiology of the cardiovascular system and associated critical values. Provides general exercise guidelines with specific guidelines to those with heart or lung disease.    Pulmonary Rehab from 01/02/2018 in [facility] Cardiac and Pulmonary Rehab  Date  11/16/17  Educator  [facility]  Instruction Review Code  1- Verbalizes Understanding      Aerobic Exercise & Resistance Training: - Gives group  verbal and written instruction on the various components of exercise. Focuses on aerobic and resistive training programs and the benefits of this training and how to safely progress through these programs.   Pulmonary Rehab from 01/02/2018 in [facility] Cardiac and Pulmonary Rehab  Date  11/30/17  Educator  AS  Instruction Review Code  1- Verbalizes Understanding      Flexibility, Balance, Mind/Body Relaxation: Provides group verbal/written instruction on the benefits of flexibility and balance training, including mind/body exercise modes such as yoga, pilates and tai chi.  Demonstration and skill practice provided.   Pulmonary Rehab from 01/02/2018 in [facility] Cardiac and Pulmonary Rehab  Date  12/26/17  Educator  AS  Instruction Review Code  1- Verbalizes Understanding      Stress and Anxiety: - Provides group verbal and written instruction about the health risks of elevated stress and causes of high stress.  Discuss the correlation between heart/lung disease and anxiety and treatment options. Review healthy ways to manage with stress and anxiety.   Depression: - Provides group verbal and written instruction on the correlation between heart/lung disease and depressed mood, treatment options, and the stigmas associated with seeking treatment.   Pulmonary Rehab from 01/02/2018 in [facility] Cardiac and Pulmonary Rehab  Date  01/02/18  Educator  [facility]  Instruction Review Code  1- Verbalizes Understanding      Exercise & Equipment Safety: - Individual verbal instruction and demonstration of equipment use and safety with use of the equipment.   Pulmonary Rehab from 01/02/2018 in [facility] Cardiac and Pulmonary Rehab  Date  09/17/17  Educator  [facility]  Instruction Review Code  1- Verbalizes Understanding      Infection Prevention: - Provides verbal and written material to individual with discussion of infection control including proper hand washing and proper equipment cleaning during exercise session.    Pulmonary Rehab from 01/02/2018 in [facility] Cardiac and Pulmonary Rehab  Date  09/17/17  Educator  [facility]  Instruction Review Code  1- Verbalizes Understanding      Falls Prevention: - Provides verbal and written material to individual with discussion of falls prevention and safety.   Pulmonary Rehab from 01/02/2018 in [facility] Cardiac and Pulmonary Rehab  Date  09/17/17  Educator  [facility]  Instruction Review Code  1- Verbalizes Understanding      Diabetes: - Individual verbal and written instruction to review signs/symptoms of diabetes, desired ranges of glucose level fasting, after meals and with exercise. Advice that pre and post exercise glucose checks will be done for 3 sessions at entry of program.   Chronic Lung Diseases: - Group verbal and written instruction to review updates, respiratory medications, advancements in procedures and treatments. Discuss use of supplemental oxygen including available portable oxygen systems, continuous and intermittent flow rates, concentrators, personal use and safety guidelines. Review proper use of inhaler and spacers. Provide informative websites for self-education.    Pulmonary Rehab from 01/02/2018 in [facility] Cardiac and Pulmonary Rehab  Date  11/14/17  Educator  [facility]  Instruction Review Code  1- Verbalizes Understanding  Energy Conservation: - Provide group verbal and written instruction for methods to conserve energy, plan and organize activities. Instruct on pacing techniques, use of adaptive equipment and posture/positioning to relieve shortness of breath.   Triggers and Exacerbations: - Group verbal and written instruction to review types of environmental triggers and ways to prevent exacerbations. Discuss weather changes, air quality and the benefits of nasal washing. Review warning signs and symptoms to help prevent infections. Discuss techniques for effective airway clearance, coughing, and vibrations.   Pulmonary Rehab from 01/02/2018 in [facility]  Cardiac and Pulmonary Rehab  Date  11/28/17  Educator  [facility]  Instruction Review Code  1- Verbalizes Understanding      AED/CPR: - Group verbal and written instruction with the use of models to demonstrate the basic use of the AED with the basic ABC's of resuscitation.   Pulmonary Rehab from 01/02/2018 in [facility] Cardiac and Pulmonary Rehab  Date  11/02/17  Educator  [facility]  Instruction Review Code  1- Actuary and Physiology of the Lungs: - Group verbal and written instruction with the use of models to provide basic lung anatomy and physiology related to function, structure and complications of lung disease.   Anatomy & Physiology of the Heart: - Group verbal and written instruction and models provide basic cardiac anatomy and physiology, with the coronary electrical and arterial systems. Review of Valvular disease and Heart Failure   Pulmonary Rehab from 01/02/2018 in [facility] Cardiac and Pulmonary Rehab  Date  12/12/17  Educator  [facility]  Instruction Review Code  1- Verbalizes Understanding      Cardiac Medications: - Group verbal and written instruction to review commonly prescribed medications for heart disease. Reviews the medication, class of the drug, and side effects.   Pulmonary Rehab from 01/02/2018 in [facility] Cardiac and Pulmonary Rehab  Date  12/28/17  Educator  [facility]  Instruction Review Code  1- Verbalizes Understanding      Know Your Numbers and Risk Factors: -Group verbal and written instruction about important numbers in your health.  Discussion of what are risk factors and how they play a role in the disease process.  Review of Cholesterol, Blood Pressure, Diabetes, and BMI and the role they play in your overall health.   Pulmonary Rehab from 01/02/2018 in [facility] Cardiac and Pulmonary Rehab  Date  11/21/17  Educator  [facility]  Instruction Review Code  1- Verbalizes Understanding      Sleep Hygiene: -Provides group verbal and written instruction about how  sleep can affect your health.  Define sleep hygiene, discuss sleep cycles and impact of sleep habits. Review good sleep hygiene tips.    Other: -Provides group and verbal instruction on various topics (see comments)   Pulmonary Rehab from 01/02/2018 in [facility] Cardiac and Pulmonary Rehab  Date  12/05/17  Educator  [facility]  Instruction Review Code  1- Verbalizes Understanding [SLEEP]       Knowledge Questionnaire Score: Knowledge Questionnaire Score - 12/26/17 1131      Knowledge Questionnaire Score   Pre Score  17/18    Post Score  15/18 REVIEWED with patient        Core Components/Risk Factors/Patient Goals at Admission: Personal Goals and Risk Factors at Admission - 09/17/17 1507      Core Components/Risk Factors/Patient Goals on Admission    Weight Management  Yes;Weight Loss    Intervention  Weight Management: Develop a combined nutrition and exercise program designed to reach desired caloric intake,  while maintaining appropriate intake of nutrient and fiber, sodium and fats, and appropriate energy expenditure required for the weight goal.;Weight Management: Provide education and appropriate resources to help participant work on and attain dietary goals.;Weight Management/Obesity: Establish reasonable short term and long term weight goals.    Admit Weight  148 lb 8 oz (67.4 kg)    Goal Weight: Short Term  141 lb (64 kg)    Goal Weight: Long Term  136 lb (61.7 kg)    Expected Outcomes  Short Term: Continue to assess and modify interventions until short term weight is achieved;Long Term: Adherence to nutrition and physical activity/exercise program aimed toward attainment of established weight goal;Weight Maintenance: Understanding of the daily nutrition guidelines, which includes 25-35% calories from fat, 7% or less cal from saturated fats, less than 265m cholesterol, less than 1.5gm of sodium, & 5 or more servings of fruits and vegetables daily;Weight Loss: Understanding of general  recommendations for a balanced deficit meal plan, which promotes 1-2 lb weight loss per week and includes a negative energy balance of [phone] kcal/d;Understanding recommendations for meals to include 15-35% energy as protein, 25-35% energy from fat, 35-60% energy from carbohydrates, less than 2043mof dietary cholesterol, 20-35 gm of total fiber daily;Understanding of distribution of calorie intake throughout the day with the consumption of 4-5 meals/snacks    Improve shortness of breath with ADL's  Yes    Intervention  Provide education, individualized exercise plan and daily activity instruction to help decrease symptoms of SOB with activities of daily living.    Expected Outcomes  Short Term: Achieves a reduction of symptoms when performing activities of daily living.    Heart Failure  Yes    Intervention  Provide a combined exercise and nutrition program that is supplemented with education, support and counseling about heart failure. Directed toward relieving symptoms such as shortness of breath, decreased exercise tolerance, and extremity edema.    Expected Outcomes  Improve functional capacity of life;Short term: Attendance in program 2-3 days a week with increased exercise capacity. Reported lower sodium intake. Reported increased fruit and vegetable intake. Reports medication compliance.;Short term: Daily weights obtained and reported for increase. Utilizing diuretic protocols set by physician.;Long term: Adoption of self-care skills and reduction of barriers for early signs and symptoms recognition and intervention leading to self-care maintenance.    Hypertension  Yes    Intervention  Provide education on lifestyle modifcations including regular physical activity/exercise, weight management, moderate sodium restriction and increased consumption of fresh fruit, vegetables, and low fat dairy, alcohol moderation, and smoking cessation.;Monitor prescription use compliance.    Expected Outcomes  Long  Term: Maintenance of blood pressure at goal levels.;Short Term: Continued assessment and intervention until BP is < 140/9060mG in hypertensive participants. < 130/94m41m in hypertensive participants with diabetes, heart failure or chronic kidney disease.    Lipids  Yes takes medication    Intervention  Provide education and support for participant on nutrition & aerobic/resistive exercise along with prescribed medications to achieve LDL <70mg93mL >40mg.13mExpected Outcomes  Long Term: Cholesterol controlled with medications as prescribed, with individualized exercise RX and with personalized nutrition plan. Value goals: LDL < 70mg, 38m> 40 mg.;Short Term: Participant states understanding of desired cholesterol values and is compliant with medications prescribed. Participant is following exercise prescription and nutrition guidelines.       Core Components/Risk Factors/Patient Goals Review:  Goals and Risk Factor Review    Row Name 10/19/17 1217 11/09/17 1146  12/19/17 1150 01/04/18 1232       Core Components/Risk Factors/Patient Goals Review   Personal Goals Review  Improve shortness of breath with ADL's;Lipids;Hypertension;Heart Failure;Stress;Weight Management/Obesity  Improve shortness of breath with ADL's;Hypertension;Lipids;Heart Failure;Weight Management/Obesity;Stress  Improve shortness of breath with ADL's;Hypertension;Lipids;Heart Failure;Weight Management/Obesity  Improve shortness of breath with ADL's;Hypertension;Lipids;Heart Failure;Weight Management/Obesity    Review  Ela's Lipids back in October where within normal limits. She is taking her medication as directed for her cholesterol. Her blood pressure has improved slightly since the begining of the program. She feels like her strength has improved and has vacuumed recently and she said she did not get as short of breath.  James is still taking her cholesterol medicine and eating healthier to improve numbers. Her blood pressure  and heart rate are doing well. Her stamina and strength have greatly improved since the beginning of the program. Stress level has improved. She has been taking B6 and limiting sodium to help her heart failure symptoms.   Aliah is doing well with her weight.  She fluctuates between 147-150 lbs.  She weighs daily and has her plan set if she goes over her normal.  She has continued to watch her sodium intake and has not had any symptoms of her heart failure.  She had a follow with both cardiologist and pulmonolgist.  Heart is good and she has another CT scan of her lungs scheduled in a few weeks.  Breathing is getting better and her saturations have been good.  She is able to do more at home without SOB.  Her strength has helped and she impressed her daughter moving around chairs. Barabara's blood pressures have been good  and she has continued to check it at home.  She has not had a lipid panel checked but they did increase her simastatin.   She is planning to conintue to monitor rhythm and blood pressure.  She has been able to do more at home with less SOB.  She continues to try to build more speed while doing more.  Her weight has been steady.  No heart failure symptoms    Expected Outcomes  Short: continue to attend [name] Regularly. Long: Improve blood pressure readings further.  Short: continue to attend rehab and education. Long: continue to improve SOB and stamina, take what she has learned and apply at home.   Short: Continue to stay on top of weight and heart failure.  Long: Continue to get stronger and improved SOB.   Short: Continue to stay on top of heart failure. Long: Continue to work on risk factors.        Core Components/Risk Factors/Patient Goals at Discharge (Final Review):  Goals and Risk Factor Review - 01/04/18 1232      Core Components/Risk Factors/Patient Goals Review   Personal Goals Review  Improve shortness of breath with ADL's;Hypertension;Lipids;Heart Failure;Weight  Management/Obesity    Review  She is planning to conintue to monitor rhythm and blood pressure.  She has been able to do more at home with less SOB.  She continues to try to build more speed while doing more.  Her weight has been steady.  No heart failure symptoms    Expected Outcomes  Short: Continue to stay on top of heart failure. Long: Continue to work on risk factors.        ITP Comments: ITP Comments    Row Name 09/17/17 1515 10/01/17 0849 10/29/17 0832 11/26/17 0832 12/24/17 0932   ITP Comments  Medical Evaluation completed.  Chart sent for review and changes to Dr. Emily Filbert Director of [place]. Diagnosis can be found in [facility] encounter 09/17/17  30 day review completed. ITP sent to Dr. Emily Filbert Director of [place]. Continue with ITP unless changes are made by physician.    30 day review completed. ITP sent to Dr. Emily Filbert Director of [place]. Continue with ITP unless changes are made by physician.    30 day review completed. ITP sent to Dr. Emily Filbert Director of [place]. Continue with ITP unless changes are made by physician.  30 day review completed. ITP sent to Dr. Emily Filbert Director of [place]. Continue with ITP unless changes are made by physician.   Row Name 01/07/18 1138           ITP Comments  Nadalyn graduated today from  rehab with 36 sessions completed.  Details of the patient's exercise prescription and what She needs to do in order to continue the prescription and progress were discussed with patient.  Patient was given a copy of prescription and goals.  Patient verbalized understanding.  Raynisha plans to continue to exercise by walking and using the elliptical at home.          Comments: Discharge ITP

## 2018-01-07 NOTE — Patient Instructions (Signed)
Discharge Patient Instructions  Patient Details  Name: [name] MRN: [id number] Date of Birth: [birth year] Referring Provider:  Juanito Doom, MD   Number of Visits: 36/36  Reason for Discharge:  Patient reached a stable level of exercise. Patient independent in their exercise. Patient has met program and personal goals.  Smoking History:  Social History   Tobacco Use  Smoking Status Former Smoker  . Packs/day: 1.00  . Years: 40.00  . Pack years: 40.00  . Types: Cigarettes  . Last attempt to quit: 09/15/2007  . Years since quitting: 10.3  Smokeless Tobacco Never Used    Diagnosis:  Centrilobular emphysema ([place])  Initial Exercise Prescription: Initial Exercise Prescription - 09/17/17 1600      Date of Initial Exercise RX and Referring Provider   Date  09/17/17    Referring Provider  Simonne Maffucci MD      Treadmill   MPH  2    Grade  0    Minutes  15    METs  2.67      NuStep   Level  1    SPM  80    Minutes  15    METs  2.5      REL-XR   Level  1    Speed  50    Minutes  15    METs  2.5      Prescription Details   Frequency (times per week)  3    Duration  Progress to 45 minutes of aerobic exercise without signs/symptoms of physical distress      Intensity   THRR 40-80% of Max Heartrate  86-127    Ratings of Perceived Exertion  11-13    Perceived Dyspnea  0-4      Progression   Progression  Continue to progress workloads to maintain intensity without signs/symptoms of physical distress.      Resistance Training   Training Prescription  Yes    Weight  3 lbs    Reps  10-15       Discharge Exercise Prescription (Final Exercise Prescription Changes): Exercise Prescription Changes - 12/25/17 1400      Response to Exercise   Blood Pressure (Admit)  120/72    Blood Pressure (Exit)  116/66    Heart Rate (Admit)  69 bpm    Heart Rate (Exercise)  94 bpm    Heart Rate (Exit)  89 bpm    Oxygen Saturation (Admit)  97 %    Oxygen  Saturation (Exercise)  95 %    Oxygen Saturation (Exit)  98 %    Rating of Perceived Exertion (Exercise)  15    Perceived Dyspnea (Exercise)  3    Symptoms  none    Duration  Continue with 45 min of aerobic exercise without signs/symptoms of physical distress.    Intensity  THRR unchanged      Progression   Progression  Continue to progress workloads to maintain intensity without signs/symptoms of physical distress.    Average METs  4.34      Resistance Training   Training Prescription  Yes    Weight  4 lbs    Reps  10-15      Interval Training   Interval Training  No      Treadmill   MPH  2.9    Grade  0.5    Minutes  15    METs  3.42      NuStep   Level  4  SPM  107    Minutes  15    METs  4      REL-XR   Level  4    Speed  69    Minutes  15    METs  5.6      Home Exercise Plan   Plans to continue exercise at  Home (comment) [place] track [place] mall    Frequency  Add 3 additional days to program exercise sessions. continue to walk    Initial Home Exercises Provided  10/03/17       Functional Capacity: 6 Minute Walk    Row Name 09/17/17 1605 01/02/18 1222       6 Minute Walk   Phase  Initial  Discharge    Distance  1400 feet  1650 feet    Distance % Change  -  17.8 %    Distance Feet Change  -  250 ft    Walk Time  6 minutes  6 minutes    # of Rest Breaks  0  0    MPH  2.65  3.13    METS  2.75  3.26    RPE  13  15    Perceived Dyspnea   2  3    VO2 Peak  9.63  11.41    Symptoms  Yes (comment)  Yes (comment)    Comments  SOB  SOB    Resting HR  46 bpm  69 bpm    Resting BP  116/64  124/66    Resting Oxygen Saturation   100 %  98 %    Exercise Oxygen Saturation  during 6 min walk  94 %  88 %    Max Ex. HR  74 bpm  92 bpm    Max Ex. BP  144/60  126/64    2 Minute Post BP  132/70  120/60      Interval HR   1 Minute HR  64  70    2 Minute HR  74  - signal loss    3 Minute HR  - error  - signal loss    4 Minute HR  - error  71    5 Minute HR   - error  70    6 Minute HR  67  92    2 Minute Post HR  50  86    Interval Heart Rate?  Yes pulse oximeter stopped working halfway through walk test  Yes      Interval Oxygen   Interval Oxygen?  Yes  Yes    Baseline Oxygen Saturation %  100 %  98 %    1 Minute Oxygen Saturation %  100 %  91 %    1 Minute Liters of Oxygen  0 L Room Air  0 L Room Air    2 Minute Oxygen Saturation %  94 %  92 %    2 Minute Liters of Oxygen  0 L  0 L    3 Minute Oxygen Saturation %  - error  92 %    3 Minute Liters of Oxygen  0 L  0 L    4 Minute Oxygen Saturation %  - error  88 %    4 Minute Liters of Oxygen  0 L  0 L    5 Minute Oxygen Saturation %  - error  88 %    5 Minute Liters of Oxygen  0 L  0  L    6 Minute Oxygen Saturation %  94 %  89 %    6 Minute Liters of Oxygen  0 L  0 L    2 Minute Post Oxygen Saturation %  98 %  96 %    2 Minute Post Liters of Oxygen  0 L  0 L       Quality of Life:   Personal Goals: Goals established at orientation with interventions provided to work toward goal. Personal Goals and Risk Factors at Admission - 09/17/17 1507      Core Components/Risk Factors/Patient Goals on Admission    Weight Management  Yes;Weight Loss    Intervention  Weight Management: Develop a combined nutrition and exercise program designed to reach desired caloric intake, while maintaining appropriate intake of nutrient and fiber, sodium and fats, and appropriate energy expenditure required for the weight goal.;Weight Management: Provide education and appropriate resources to help participant work on and attain dietary goals.;Weight Management/Obesity: Establish reasonable short term and long term weight goals.    Admit Weight  148 lb 8 oz (67.4 kg)    Goal Weight: Short Term  141 lb (64 kg)    Goal Weight: Long Term  136 lb (61.7 kg)    Expected Outcomes  Short Term: Continue to assess and modify interventions until short term weight is achieved;Long Term: Adherence to nutrition and physical  activity/exercise program aimed toward attainment of established weight goal;Weight Maintenance: Understanding of the daily nutrition guidelines, which includes 25-35% calories from fat, 7% or less cal from saturated fats, less than 233m cholesterol, less than 1.5gm of sodium, & 5 or more servings of fruits and vegetables daily;Weight Loss: Understanding of general recommendations for a balanced deficit meal plan, which promotes 1-2 lb weight loss per week and includes a negative energy balance of [phone] kcal/d;Understanding recommendations for meals to include 15-35% energy as protein, 25-35% energy from fat, 35-60% energy from carbohydrates, less than 2035mof dietary cholesterol, 20-35 gm of total fiber daily;Understanding of distribution of calorie intake throughout the day with the consumption of 4-5 meals/snacks    Improve shortness of breath with ADL's  Yes    Intervention  Provide education, individualized exercise plan and daily activity instruction to help decrease symptoms of SOB with activities of daily living.    Expected Outcomes  Short Term: Achieves a reduction of symptoms when performing activities of daily living.    Heart Failure  Yes    Intervention  Provide a combined exercise and nutrition program that is supplemented with education, support and counseling about heart failure. Directed toward relieving symptoms such as shortness of breath, decreased exercise tolerance, and extremity edema.    Expected Outcomes  Improve functional capacity of life;Short term: Attendance in program 2-3 days a week with increased exercise capacity. Reported lower sodium intake. Reported increased fruit and vegetable intake. Reports medication compliance.;Short term: Daily weights obtained and reported for increase. Utilizing diuretic protocols set by physician.;Long term: Adoption of self-care skills and reduction of barriers for early signs and symptoms recognition and intervention leading to self-care  maintenance.    Hypertension  Yes    Intervention  Provide education on lifestyle modifcations including regular physical activity/exercise, weight management, moderate sodium restriction and increased consumption of fresh fruit, vegetables, and low fat dairy, alcohol moderation, and smoking cessation.;Monitor prescription use compliance.    Expected Outcomes  Long Term: Maintenance of blood pressure at goal levels.;Short Term: Continued assessment and intervention until BP is < 140/9037m  HG in hypertensive participants. < 130/38m HG in hypertensive participants with diabetes, heart failure or chronic kidney disease.    Lipids  Yes takes medication    Intervention  Provide education and support for participant on nutrition & aerobic/resistive exercise along with prescribed medications to achieve LDL <715m HDL >4061m   Expected Outcomes  Long Term: Cholesterol controlled with medications as prescribed, with individualized exercise RX and with personalized nutrition plan. Value goals: LDL < 31m64mDL > 40 mg.;Short Term: Participant states understanding of desired cholesterol values and is compliant with medications prescribed. Participant is following exercise prescription and nutrition guidelines.        Personal Goals Discharge: Goals and Risk Factor Review - 01/04/18 1232      Core Components/Risk Factors/Patient Goals Review   Personal Goals Review  Improve shortness of breath with ADL's;Hypertension;Lipids;Heart Failure;Weight Management/Obesity    Review  She is planning to conintue to monitor rhythm and blood pressure.  She has been able to do more at home with less SOB.  She continues to try to build more speed while doing more.  Her weight has been steady.  No heart failure symptoms    Expected Outcomes  Short: Continue to stay on top of heart failure. Long: Continue to work on risk factors.        Exercise Goals and Review: Exercise Goals    Row Name 09/17/17 1610              Exercise Goals   Increase Physical Activity  Yes       Intervention  Provide advice, education, support and counseling about physical activity/exercise needs.;Develop an individualized exercise prescription for aerobic and resistive training based on initial evaluation findings, risk stratification, comorbidities and participant's personal goals.       Expected Outcomes  Achievement of increased cardiorespiratory fitness and enhanced flexibility, muscular endurance and strength shown through measurements of functional capacity and personal statement of participant.       Increase Strength and Stamina  Yes       Intervention  Provide advice, education, support and counseling about physical activity/exercise needs.;Develop an individualized exercise prescription for aerobic and resistive training based on initial evaluation findings, risk stratification, comorbidities and participant's personal goals.       Expected Outcomes  Achievement of increased cardiorespiratory fitness and enhanced flexibility, muscular endurance and strength shown through measurements of functional capacity and personal statement of participant.       Able to understand and use rate of perceived exertion (RPE) scale  Yes       Intervention  Provide education and explanation on how to use RPE scale       Expected Outcomes  Short Term: Able to use RPE daily in rehab to express subjective intensity level;Long Term:  Able to use RPE to guide intensity level when exercising independently       Able to understand and use Dyspnea scale  Yes       Intervention  Provide education and explanation on how to use Dyspnea scale       Expected Outcomes  Short Term: Able to use Dyspnea scale daily in rehab to express subjective sense of shortness of breath during exertion;Long Term: Able to use Dyspnea scale to guide intensity level when exercising independently       Knowledge and understanding of Target Heart Rate Range (THRR)  Yes        Intervention  Provide education and explanation of THRR including how  the numbers were predicted and where they are located for reference       Expected Outcomes  Short Term: Able to state/look up THRR;Long Term: Able to use THRR to govern intensity when exercising independently;Short Term: Able to use daily as guideline for intensity in rehab       Able to check pulse independently  Yes       Intervention  Provide education and demonstration on how to check pulse in carotid and radial arteries.;Review the importance of being able to check your own pulse for safety during independent exercise       Expected Outcomes  Short Term: Able to explain why pulse checking is important during independent exercise;Long Term: Able to check pulse independently and accurately       Understanding of Exercise Prescription  Yes       Intervention  Provide education, explanation, and written materials on patient's individual exercise prescription       Expected Outcomes  Long Term: Able to explain home exercise prescription to exercise independently;Short Term: Able to explain program exercise prescription          Nutrition & Weight - Outcomes: Pre Biometrics - 09/17/17 1611      Pre Biometrics   Height  5' 3.6" (1.615 m)    Weight  148 lb 8 oz (67.4 kg)    Waist Circumference  32.25 inches    Hip Circumference  41.25 inches    Waist to Hip Ratio  0.78 %    BMI (Calculated)  25.83      Post Biometrics - 01/02/18 1224       Post  Biometrics   Height  5' 3.6" (1.615 m)    Weight  148 lb (67.1 kg)    Waist Circumference  30 inches    Hip Circumference  39 inches    Waist to Hip Ratio  0.77 %    BMI (Calculated)  25.74       Nutrition: Nutrition Therapy & Goals - 10/19/17 1209      Nutrition Therapy   RD appointment deferred  Yes       Nutrition Discharge: Nutrition Assessments - 12/26/17 1134      MEDFICTS Scores   Pre Score  13       Education Questionnaire Score: Knowledge  Questionnaire Score - 12/26/17 1131      Knowledge Questionnaire Score   Pre Score  17/18    Post Score  15/18 REVIEWED with patient       Goals reviewed with patient; copy given to patient.

## 2018-01-08 ENCOUNTER — Telehealth: Payer: Self-pay | Admitting: Internal Medicine

## 2018-01-08 NOTE — Telephone Encounter (Signed)
Left message for patient to call back  

## 2018-01-08 NOTE — Telephone Encounter (Signed)
Patient asking about her ASA. She states that she recently saw Dr. Caryl Comes and she states that Dr. Caryl Comes was going to ask Dr. Claiborne Billings if the patient needed to remain on ASA or if she could stop it. Made patient aware that I would forward the information to Dr. Claiborne Billings and Dr. Caryl Comes for review and recommendation.

## 2018-01-08 NOTE — Telephone Encounter (Signed)
New message  Patient has questions about staying on Aspirin. Patient is wanting Dr Claiborne Billings and Dr Caryl Comes to agree on the usage of Aspirin.  Patient also has questions about the level and dosage on Coumadin.  Please call  Pt c/o medication issue:  1. Name of Medication: aspirin EC 81 MG tablet and Coumadin  2. How are you currently taking this medication (dosage and times per day)? As prescribed  3. Are you having a reaction (difficulty breathing--STAT)? No  4. What is your medication issue? Patient wants to know if she needs to continue aspirin. Questions about Coumadin level

## 2018-01-08 NOTE — Telephone Encounter (Signed)
Follow Up:; ° ° °Returning your call. °

## 2018-01-08 NOTE — Telephone Encounter (Signed)
Returned call to pt and addressed Coumadin dosage question as related to her vit K intake.  See anticoagulation note addendum in Epic, dosage increased to account for increased vit K intake in diet.  Pt awaiting call back from [place] in regards to ASA.

## 2018-01-09 ENCOUNTER — Telehealth: Payer: Self-pay | Admitting: Cardiovascular Disease

## 2018-01-09 NOTE — Telephone Encounter (Signed)
Returned call to patient. Explained that PCSK9 inhibitors are alternatives to traditional oral antihyperlipidemics but that her LDL of 74 from Oct 2018 would like not qualify her for this medication. She was advised to have her lab work done to recheck her cholesterol - plans to have it done early next week.   Will route to MD as Juluis Rainier since patient was inquiring about these meds

## 2018-01-09 NOTE — Telephone Encounter (Signed)
With CAD and PVD, would continue low-dose baby aspirin with warfarin

## 2018-01-09 NOTE — Telephone Encounter (Signed)
New Message   Per pt she heard about a new shot that would replace simvastatin (ZOCOR) 20 MG tablet  , and she just wanted to check with Dr. Claiborne Billings about it and see if she would be a good candidate for it. Requesting call back from nurse

## 2018-01-09 NOTE — Telephone Encounter (Signed)
Called and made patient aware of recommendations to continue taking a baby ASA 81 mg QD and coumadin, Patient verbalized understanding and thanked me for the call.

## 2018-01-10 DIAGNOSIS — E785 Hyperlipidemia, unspecified: Secondary | ICD-10-CM | POA: Diagnosis not present

## 2018-01-10 DIAGNOSIS — Z79899 Other long term (current) drug therapy: Secondary | ICD-10-CM | POA: Diagnosis not present

## 2018-01-11 DIAGNOSIS — M8000XG Age-related osteoporosis with current pathological fracture, unspecified site, subsequent encounter for fracture with delayed healing: Secondary | ICD-10-CM | POA: Insufficient documentation

## 2018-01-11 LAB — COMPREHENSIVE METABOLIC PANEL
ALT: 16 IU/L (ref 0–32)
AST: 20 IU/L (ref 0–40)
Albumin/Globulin Ratio: 2.2 (ref 1.2–2.2)
Albumin: 4.3 g/dL (ref 3.5–4.8)
Alkaline Phosphatase: 46 IU/L (ref 39–117)
BUN/Creatinine Ratio: 16 (ref 12–28)
BUN: 10 mg/dL (ref 8–27)
Bilirubin Total: 0.3 mg/dL (ref 0.0–1.2)
CO2: 24 mmol/L (ref 20–29)
Calcium: 8.8 mg/dL (ref 8.7–10.3)
Chloride: 101 mmol/L (ref 96–106)
Creatinine, Ser: 0.62 mg/dL (ref 0.57–1.00)
GFR calc Af Amer: 103 mL/min/{1.73_m2} (ref >59)
GFR calc non Af Amer: 90 mL/min/{1.73_m2} (ref >59)
Globulin, Total: 2 g/dL (ref 1.5–4.5)
Glucose: 97 mg/dL (ref 65–99)
Potassium: 4.6 mmol/L (ref 3.5–5.2)
Sodium: 140 mmol/L (ref 134–144)
Total Protein: 6.3 g/dL (ref 6.0–8.5)

## 2018-01-11 LAB — LIPID PANEL
Chol/HDL Ratio: 2.7 ratio (ref 0.0–4.4)
Cholesterol, Total: 178 mg/dL (ref 100–199)
HDL: 67 mg/dL (ref >39)
LDL Calculated: 97 mg/dL (ref 0–99)
Triglycerides: 69 mg/dL (ref 0–149)
VLDL Cholesterol Cal: 14 mg/dL (ref 5–40)

## 2018-01-11 NOTE — Telephone Encounter (Signed)
ok 

## 2018-01-14 ENCOUNTER — Telehealth: Payer: Self-pay | Admitting: Acute Care

## 2018-01-14 NOTE — Telephone Encounter (Signed)
Pt states that she was reviewing her imaging reports on Mychart and noticed that her CT scans have noted a stable 48mm nodule that she was not made aware of.  I advised pt that the imaging showed that it is stable and that annual imaging was recommended.  Pt is requesting sooner imaging d/t her recent lobectomy d/t lung cancer.  Pt denies any new breathing complaints.  I advised that BQ is out of the office currently and could address this upon return on 3/11 as pt is no longer enrolled in lung nodule clinic.  Pt expressed understanding.  BQ please advise.

## 2018-01-21 NOTE — Telephone Encounter (Signed)
Called pt and advised message from the provider. Pt understood and verbalized understanding. Nothing further is needed.    

## 2018-01-21 NOTE — Telephone Encounter (Signed)
We will discuss how to follow this when I see her back in July.  Based on its small size there is nothing to do differently between now and then.

## 2018-01-25 ENCOUNTER — Telehealth: Payer: Self-pay | Admitting: Cardiovascular Disease

## 2018-01-25 NOTE — Telephone Encounter (Signed)
New message    Pt is calling stating that she was on amiodarone after lung surgery until September and it messed up her system. She said she thinks that simvastatin and amiodarone don't mix well.

## 2018-01-25 NOTE — Telephone Encounter (Signed)
Okay to hold simvastatin until Monday. We can discuss therapy further during her coumadin appointment on Monday 3/18  Amiodarone to be adjusted by MD

## 2018-01-28 ENCOUNTER — Ambulatory Visit (INDEPENDENT_AMBULATORY_CARE_PROVIDER_SITE_OTHER): Payer: Medicare Other | Admitting: *Deleted

## 2018-01-28 DIAGNOSIS — Z5181 Encounter for therapeutic drug level monitoring: Secondary | ICD-10-CM | POA: Diagnosis not present

## 2018-01-28 DIAGNOSIS — I480 Paroxysmal atrial fibrillation: Secondary | ICD-10-CM | POA: Diagnosis not present

## 2018-01-28 LAB — POCT INR: INR: 2.8

## 2018-01-28 NOTE — Patient Instructions (Signed)
Description   Today only take 1/2 tablet, then continue taking 1 tablet daily except 1.5 tablet on Tuesdays, Thursdays, and Saturdays.  Recheck in 2 weeks.

## 2018-01-30 NOTE — Telephone Encounter (Signed)
Talked to [name] today.  She is not on amiodarone anymore. Noticed increased leg pain after change from simvastatin 20mg  to 40mg  daily.  Instructed to HOLD simvastatin for 1 week, then resume 20mg  daily if symptoms improved. Will talk to patient about additional options for cholesterol management during next INR appointment

## 2018-02-11 ENCOUNTER — Ambulatory Visit (INDEPENDENT_AMBULATORY_CARE_PROVIDER_SITE_OTHER): Payer: Medicare Other | Admitting: Pharmacist

## 2018-02-11 DIAGNOSIS — Z5181 Encounter for therapeutic drug level monitoring: Secondary | ICD-10-CM

## 2018-02-11 DIAGNOSIS — I480 Paroxysmal atrial fibrillation: Secondary | ICD-10-CM

## 2018-02-11 LAB — POCT INR: INR: 2.3

## 2018-02-21 ENCOUNTER — Other Ambulatory Visit: Payer: Self-pay | Admitting: *Deleted

## 2018-02-21 DIAGNOSIS — E785 Hyperlipidemia, unspecified: Secondary | ICD-10-CM

## 2018-02-21 DIAGNOSIS — Z79899 Other long term (current) drug therapy: Secondary | ICD-10-CM

## 2018-02-27 ENCOUNTER — Ambulatory Visit: Payer: Medicare Other | Admitting: Surgery

## 2018-03-11 ENCOUNTER — Ambulatory Visit (INDEPENDENT_AMBULATORY_CARE_PROVIDER_SITE_OTHER): Payer: Medicare Other | Admitting: Pharmacist Clinician (PhC)/ Clinical Pharmacy Specialist

## 2018-03-11 DIAGNOSIS — Z5181 Encounter for therapeutic drug level monitoring: Secondary | ICD-10-CM

## 2018-03-11 DIAGNOSIS — Z7901 Long term (current) use of anticoagulants: Secondary | ICD-10-CM | POA: Diagnosis not present

## 2018-03-11 DIAGNOSIS — I480 Paroxysmal atrial fibrillation: Secondary | ICD-10-CM

## 2018-03-11 DIAGNOSIS — I483 Typical atrial flutter: Secondary | ICD-10-CM | POA: Diagnosis not present

## 2018-03-11 LAB — POCT INR: INR: 3.2

## 2018-03-17 ENCOUNTER — Other Ambulatory Visit: Payer: Self-pay | Admitting: Cardiovascular Disease

## 2018-03-18 NOTE — Telephone Encounter (Signed)
REFILL 

## 2018-03-27 ENCOUNTER — Other Ambulatory Visit: Payer: Self-pay

## 2018-03-27 ENCOUNTER — Encounter: Payer: Self-pay | Admitting: Surgery

## 2018-03-27 ENCOUNTER — Ambulatory Visit (INDEPENDENT_AMBULATORY_CARE_PROVIDER_SITE_OTHER): Payer: Medicare Other | Admitting: Surgery

## 2018-03-27 VITALS — BP 116/56 | HR 62 | Resp 16 | Ht 63.0 in | Wt 148.4 lb

## 2018-03-27 DIAGNOSIS — I2583 Coronary atherosclerosis due to lipid rich plaque: Secondary | ICD-10-CM | POA: Diagnosis not present

## 2018-03-27 DIAGNOSIS — Z902 Acquired absence of lung [part of]: Secondary | ICD-10-CM

## 2018-03-27 DIAGNOSIS — I2510 Atherosclerotic heart disease of native coronary artery without angina pectoris: Secondary | ICD-10-CM

## 2018-03-28 DIAGNOSIS — H25811 Combined forms of age-related cataract, right eye: Secondary | ICD-10-CM | POA: Diagnosis not present

## 2018-03-28 DIAGNOSIS — H2512 Age-related nuclear cataract, left eye: Secondary | ICD-10-CM | POA: Diagnosis not present

## 2018-03-28 DIAGNOSIS — H04123 Dry eye syndrome of bilateral lacrimal glands: Secondary | ICD-10-CM | POA: Diagnosis not present

## 2018-03-28 DIAGNOSIS — M3501 Sicca syndrome with keratoconjunctivitis: Secondary | ICD-10-CM | POA: Diagnosis not present

## 2018-03-29 ENCOUNTER — Encounter: Payer: Self-pay | Admitting: Surgery

## 2018-03-29 NOTE — Progress Notes (Signed)
HPI:  Patient returns for surveillance follow-up having undergone left muscle-sparing thoracotomy and left lower lobectomyon 07/02/2017.  Her postoperative recovery was somewhat slow due to moderate COPD but she has attended pulmonary rehab and has significantly improved.  She states she is now walking around without shortness of breath.  She denies any headaches or visual changes.  She has had no muscle or bone pain.  She has mild discomfort anterior and below the left thoracotomy incision.  She denies any cough or sputum production.    Current Outpatient Medications  Medication Sig Dispense Refill  . acetaminophen (TYLENOL) 500 MG tablet Take 500 mg by mouth every 8 (eight) hours as needed for mild pain or moderate pain.    [name] albuterol (PROAIR HFA) 108 (90 Base) MCG/ACT inhaler Inhale 1-2 puffs into the lungs every 6 (six) hours as needed for wheezing or shortness of breath. 1 Inhaler 3  . aspirin EC 81 MG tablet Take 81 mg by mouth at bedtime.    . Calcium Carb-Cholecalciferol (CALTRATE 600+D) 600-800 MG-UNIT TABS Take 1 tablet by mouth daily.     . Cholecalciferol (VITAMIN D-3) 5000 UNITS TABS Take 5,000 Units by mouth daily.     [name] ezetimibe (ZETIA) 10 MG tablet TAKE 1 TABLET(10 MG) BY MOUTH EVERY EVENING 30 tablet 2  . hydrocortisone valerate cream (WESTCORT) 0.2 % Apply 1 application topically 3 (three) times a week. On random days - for eczema in ear    . INCRUSE ELLIPTA 62.5 MCG/INH AEPB INHALE 1 PUFF INTO THE LUNGS DAILY 60 each 5  . isosorbide mononitrate (IMDUR) 30 MG 24 hr tablet TAKE 1 TABLET(30 MG) BY MOUTH TWICE DAILY 60 tablet 6  . lisinopril (PRINIVIL,ZESTRIL) 20 MG tablet TAKE 1 TABLET(20 MG) BY MOUTH TWICE DAILY 60 tablet 11  . metoprolol succinate (TOPROL XL) 25 MG 24 hr tablet Take 1 tablet (25 mg total) by mouth daily. 90 tablet 3  . metoprolol tartrate (LOPRESSOR) 25 MG tablet Take 1 tablet (25 mg total) by mouth daily as needed. 30 tablet 3  . nitroGLYCERIN  (NITROSTAT) 0.4 MG SL tablet Place 1 tablet (0.4 mg total) under the tongue every 5 (five) minutes x 3 doses as needed for chest pain. 25 tablet 1  . NON FORMULARY Place 1 drop into both eyes 4 (four) times daily.    [name] omega-3 acid ethyl esters (LOVAZA) 1 g capsule TAKE 1 CAPSULE BY MOUTH EVERY DAY AT NOON 30 capsule 11  . Polyethyl Glycol-Propyl Glycol (SYSTANE PRESERVATIVE FREE OP) Place 1 drop into both eyes 2 (two) times daily.     [name] pyridOXINE (VITAMIN B-6) 100 MG tablet Take 100 mg by mouth daily.    . simvastatin (ZOCOR) 20 MG tablet Take 20 mg by mouth daily.    . valACYclovir (VALTREX) 500 MG tablet Take 500 mg by mouth daily as needed. For cold sores    . warfarin (COUMADIN) 2.5 MG tablet Take 1.25-2.5 mg by mouth daily. 1 tablet daily except 1/2 tablet each Sunday Tuesday and Thursday    . warfarin (COUMADIN) 2.5 MG tablet Take 1 to 1.5 tablets daily as directed by coumadin clinic 135 tablet 0   No current facility-administered medications for this visit.      Physical Exam: BP (!) 116/56 (BP Location: Right Arm, Patient Position: Sitting, Cuff Size: Normal)   Pulse 62   Resp 16   Ht 5\' 3"  (1.6 m)   Wt 148 lb 6.4 oz (67.3 kg)  SpO2 98% Comment: RA  BMI 26.29 kg/m  She looks well. There is no cervical or supraclavicular adenopathy. Lungs are clear. Cardiac exam shows a regular rate and rhythm with normal heart sounds. The left thoracotomy scar looks normal.  Diagnostic Tests:  CLINICAL DATA:  Followup left lung carcinoma. Approximately 6 months status post left lower lobectomy. Smoker.  EXAM: CT CHEST WITHOUT CONTRAST  TECHNIQUE: Multidetector CT imaging of the chest was performed following the standard protocol without IV contrast.  COMPARISON:  05/23/2017  FINDINGS: Cardiovascular: No acute findings. Aortic and coronary artery atherosclerosis. Prior CABG.  Mediastinum/Nodes: No masses or pathologically enlarged lymph nodes identified on this  unenhanced exam.  Lungs/Pleura: Expected postop changes from left lower lobectomy since prior exam. Small left pleural effusion. Mild to moderate emphysema again noted. Stable 4 mm pulmonary nodule in the right upper lobe on image 42/3.  Upper Abdomen: Normal adrenal glands. Stable tiny left hepatic lobe cyst.  Musculoskeletal: No suspicious bone lesions. Several old thoracic vertebral compression fracture deformities are again noted.  IMPRESSION: Expected postop changes from left lower lobectomy. No evidence of recurrent or metastatic carcinoma.  Stable 4 mm right upper lobe pulmonary nodule. Recommend continued yearly followup with low-dose lung cancer screening chest CT.  Aortic Atherosclerosis (ICD10-I70.0) and Emphysema (ICD10-J43.9).   Electronically Signed   By: Earle Gell M.D.   On: 12/24/2017 15:06   Impression:  She is doing well overall and CT scan of the chest from February 2019 shows no evidence of recurrent or metastatic carcinoma.  I reviewed the CT images with her and answered her questions.  Plan:  I will plan to see her back in February 2020 with a CT scan of the chest for continued surveillance.  I spent 15 minutes performing this established patient evaluation and > 50% of this time was spent face to face counseling and coordinating the care of this patient's lung cancer surveillance.   Gaye Pollack, MD [facility] [phone]

## 2018-04-09 ENCOUNTER — Ambulatory Visit (INDEPENDENT_AMBULATORY_CARE_PROVIDER_SITE_OTHER): Payer: Medicare Other | Admitting: Pharmacist Clinician (PhC)/ Clinical Pharmacy Specialist

## 2018-04-09 DIAGNOSIS — Z5181 Encounter for therapeutic drug level monitoring: Secondary | ICD-10-CM

## 2018-04-09 DIAGNOSIS — I483 Typical atrial flutter: Secondary | ICD-10-CM

## 2018-04-09 DIAGNOSIS — I480 Paroxysmal atrial fibrillation: Secondary | ICD-10-CM

## 2018-04-09 LAB — POCT INR: INR: 4.3 — AB (ref 2.0–3.0)

## 2018-04-09 NOTE — Patient Instructions (Signed)
Description   No warfarin today Tuesday May 28, then only 1/2 tablet Wednesday May 29.. After that take 1 tablet daily except 1.5 tablets each Tuesday and Saturday.  Repeat INR in 2 weeks

## 2018-04-13 ENCOUNTER — Other Ambulatory Visit: Payer: Self-pay | Admitting: Cardiovascular Disease

## 2018-04-16 NOTE — Telephone Encounter (Signed)
Rx(s) sent to pharmacy electronically.  

## 2018-04-22 DIAGNOSIS — Z872 Personal history of diseases of the skin and subcutaneous tissue: Secondary | ICD-10-CM | POA: Diagnosis not present

## 2018-04-22 DIAGNOSIS — D2261 Melanocytic nevi of right upper limb, including shoulder: Secondary | ICD-10-CM | POA: Diagnosis not present

## 2018-04-22 DIAGNOSIS — Z09 Encounter for follow-up examination after completed treatment for conditions other than malignant neoplasm: Secondary | ICD-10-CM | POA: Diagnosis not present

## 2018-04-22 DIAGNOSIS — D692 Other nonthrombocytopenic purpura: Secondary | ICD-10-CM | POA: Diagnosis not present

## 2018-04-22 DIAGNOSIS — D2272 Melanocytic nevi of left lower limb, including hip: Secondary | ICD-10-CM | POA: Diagnosis not present

## 2018-04-22 DIAGNOSIS — D2271 Melanocytic nevi of right lower limb, including hip: Secondary | ICD-10-CM | POA: Diagnosis not present

## 2018-04-22 DIAGNOSIS — L821 Other seborrheic keratosis: Secondary | ICD-10-CM | POA: Diagnosis not present

## 2018-04-22 DIAGNOSIS — Z85828 Personal history of other malignant neoplasm of skin: Secondary | ICD-10-CM | POA: Diagnosis not present

## 2018-04-22 DIAGNOSIS — D225 Melanocytic nevi of trunk: Secondary | ICD-10-CM | POA: Diagnosis not present

## 2018-04-22 DIAGNOSIS — Z08 Encounter for follow-up examination after completed treatment for malignant neoplasm: Secondary | ICD-10-CM | POA: Diagnosis not present

## 2018-04-22 DIAGNOSIS — D2262 Melanocytic nevi of left upper limb, including shoulder: Secondary | ICD-10-CM | POA: Diagnosis not present

## 2018-04-23 ENCOUNTER — Ambulatory Visit (INDEPENDENT_AMBULATORY_CARE_PROVIDER_SITE_OTHER): Payer: Medicare Other | Admitting: Pharmacist Clinician (PhC)/ Clinical Pharmacy Specialist

## 2018-04-23 DIAGNOSIS — Z5181 Encounter for therapeutic drug level monitoring: Secondary | ICD-10-CM | POA: Diagnosis not present

## 2018-04-23 DIAGNOSIS — I483 Typical atrial flutter: Secondary | ICD-10-CM

## 2018-04-23 DIAGNOSIS — I480 Paroxysmal atrial fibrillation: Secondary | ICD-10-CM | POA: Diagnosis not present

## 2018-04-23 LAB — POCT INR: INR: 3.4 — AB (ref 2.0–3.0)

## 2018-04-25 ENCOUNTER — Other Ambulatory Visit: Payer: Self-pay | Admitting: Cardiovascular Disease

## 2018-05-08 ENCOUNTER — Ambulatory Visit (INDEPENDENT_AMBULATORY_CARE_PROVIDER_SITE_OTHER): Payer: Medicare Other | Admitting: Pharmacist Clinician (PhC)/ Clinical Pharmacy Specialist

## 2018-05-08 DIAGNOSIS — Z5181 Encounter for therapeutic drug level monitoring: Secondary | ICD-10-CM

## 2018-05-08 DIAGNOSIS — Z7901 Long term (current) use of anticoagulants: Secondary | ICD-10-CM

## 2018-05-08 DIAGNOSIS — I480 Paroxysmal atrial fibrillation: Secondary | ICD-10-CM | POA: Diagnosis not present

## 2018-05-08 LAB — POCT INR: INR: 4 — AB (ref 2.0–3.0)

## 2018-05-13 ENCOUNTER — Ambulatory Visit: Payer: Medicare Other | Admitting: Adult Health

## 2018-05-21 ENCOUNTER — Ambulatory Visit (INDEPENDENT_AMBULATORY_CARE_PROVIDER_SITE_OTHER): Payer: Medicare Other | Admitting: Pharmacist Clinician (PhC)/ Clinical Pharmacy Specialist

## 2018-05-21 DIAGNOSIS — I480 Paroxysmal atrial fibrillation: Secondary | ICD-10-CM | POA: Diagnosis not present

## 2018-05-21 DIAGNOSIS — Z5181 Encounter for therapeutic drug level monitoring: Secondary | ICD-10-CM

## 2018-05-21 DIAGNOSIS — I483 Typical atrial flutter: Secondary | ICD-10-CM | POA: Diagnosis not present

## 2018-05-21 LAB — POCT INR: INR: 3.6 — AB (ref 2.0–3.0)

## 2018-05-21 NOTE — Patient Instructions (Signed)
Description   No warfarin today Wednesday July 9, then decrease dose to 1 tablet daily except 1/2 tablet each Sunday. Repeat INR in 2 weeks

## 2018-05-30 DIAGNOSIS — H2512 Age-related nuclear cataract, left eye: Secondary | ICD-10-CM | POA: Diagnosis not present

## 2018-05-30 DIAGNOSIS — H02003 Unspecified entropion of right eye, unspecified eyelid: Secondary | ICD-10-CM | POA: Diagnosis not present

## 2018-05-30 DIAGNOSIS — M3501 Sicca syndrome with keratoconjunctivitis: Secondary | ICD-10-CM | POA: Diagnosis not present

## 2018-05-30 DIAGNOSIS — H2511 Age-related nuclear cataract, right eye: Secondary | ICD-10-CM | POA: Diagnosis not present

## 2018-06-03 ENCOUNTER — Ambulatory Visit (INDEPENDENT_AMBULATORY_CARE_PROVIDER_SITE_OTHER): Payer: Medicare Other | Admitting: Pharmacist Clinician (PhC)/ Clinical Pharmacy Specialist

## 2018-06-03 DIAGNOSIS — Z5181 Encounter for therapeutic drug level monitoring: Secondary | ICD-10-CM | POA: Diagnosis not present

## 2018-06-03 DIAGNOSIS — I480 Paroxysmal atrial fibrillation: Secondary | ICD-10-CM

## 2018-06-03 DIAGNOSIS — I483 Typical atrial flutter: Secondary | ICD-10-CM | POA: Diagnosis not present

## 2018-06-03 LAB — POCT INR: INR: 2.8 (ref 2.0–3.0)

## 2018-06-05 ENCOUNTER — Other Ambulatory Visit: Payer: Self-pay | Admitting: Cardiovascular Disease

## 2018-06-05 NOTE — Telephone Encounter (Signed)
Rx request sent to pharmacy.  

## 2018-06-06 ENCOUNTER — Encounter: Payer: Self-pay | Admitting: Pulmonary Disease

## 2018-06-06 ENCOUNTER — Other Ambulatory Visit: Payer: Medicare Other

## 2018-06-06 ENCOUNTER — Ambulatory Visit (INDEPENDENT_AMBULATORY_CARE_PROVIDER_SITE_OTHER): Payer: Medicare Other | Admitting: Pulmonary Disease

## 2018-06-06 VITALS — BP 116/74 | HR 49 | Ht 63.0 in | Wt 148.0 lb

## 2018-06-06 DIAGNOSIS — M546 Pain in thoracic spine: Secondary | ICD-10-CM

## 2018-06-06 DIAGNOSIS — I2583 Coronary atherosclerosis due to lipid rich plaque: Secondary | ICD-10-CM

## 2018-06-06 DIAGNOSIS — R911 Solitary pulmonary nodule: Secondary | ICD-10-CM | POA: Diagnosis not present

## 2018-06-06 DIAGNOSIS — I2510 Atherosclerotic heart disease of native coronary artery without angina pectoris: Secondary | ICD-10-CM | POA: Diagnosis not present

## 2018-06-06 DIAGNOSIS — C4492 Squamous cell carcinoma of skin, unspecified: Secondary | ICD-10-CM

## 2018-06-06 DIAGNOSIS — IMO0002 Reserved for concepts with insufficient information to code with codable children: Secondary | ICD-10-CM

## 2018-06-06 DIAGNOSIS — J432 Centrilobular emphysema: Secondary | ICD-10-CM | POA: Diagnosis not present

## 2018-06-06 DIAGNOSIS — R0602 Shortness of breath: Secondary | ICD-10-CM

## 2018-06-06 LAB — BASIC METABOLIC PANEL
BUN: 12 mg/dL (ref 6–23)
CO2: 30 mEq/L (ref 19–32)
Calcium: 8.9 mg/dL (ref 8.4–10.5)
Chloride: 99 mEq/L (ref 96–112)
Creatinine, Ser: 0.65 mg/dL (ref 0.40–1.20)
GFR: 94.72 mL/min (ref >60.00)
Glucose, Bld: 136 mg/dL — ABNORMAL HIGH (ref 70–99)
Potassium: 3.7 mEq/L (ref 3.5–5.1)
Sodium: 135 mEq/L (ref 135–145)

## 2018-06-06 MED ORDER — UMECLIDINIUM-VILANTEROL 62.5-25 MCG/INH IN AEPB: 1.0000 | INHALATION_SPRAY | Freq: Every day | RESPIRATORY_TRACT | 0 refills | Status: AC

## 2018-06-06 NOTE — Patient Instructions (Signed)
COPD, worsening shortness of breath: Use albuterol as needed for chest tightness wheezing or shortness of breath Take the sample of Anoro 1 puff daily, while taking this do not take Incruise.  After you have taken Anoro for a week call me to let me know if you think it is helpful and then I can change her prescription Continue to stay active Practice good hand hygiene Get a flu shot in the fall  Back pain, new: Given your history of malignancy we will arrange for a CT scan to make sure there is not something serious causing this Otherwise, follow-up with your primary care physician.  Follow-up with me in 3 to 4 months or sooner if needed

## 2018-06-06 NOTE — Addendum Note (Signed)
Addended by: Maryanna Shape A on: 06/06/2018 02:36 PM   Modules accepted: Orders
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
## 2018-06-07 ENCOUNTER — Ambulatory Visit (INDEPENDENT_AMBULATORY_CARE_PROVIDER_SITE_OTHER)
Admission: RE | Admit: 2018-06-07 | Discharge: 2018-06-07 | Disposition: A | Discharge status: Home / Self Care | Payer: Medicare Other | Source: Ambulatory Visit | Attending: Pulmonary Disease | Admitting: Pulmonary Disease

## 2018-06-07 DIAGNOSIS — C4492 Squamous cell carcinoma of skin, unspecified: Secondary | ICD-10-CM

## 2018-06-07 DIAGNOSIS — IMO0002 Reserved for concepts with insufficient information to code with codable children: Secondary | ICD-10-CM

## 2018-06-07 MED ORDER — IOPAMIDOL (ISOVUE-300) INJECTION 61%
100.0000 mL | Freq: Once | INTRAVENOUS | Status: AC | PRN
  Administered 2018-06-07: 80 mL via INTRAVENOUS

## 2018-06-11 ENCOUNTER — Telehealth: Payer: Self-pay | Admitting: Pulmonary Disease

## 2018-06-11 MED ORDER — UMECLIDINIUM-VILANTEROL 62.5-25 MCG/INH IN AEPB: 1.0000 | INHALATION_SPRAY | Freq: Every day | RESPIRATORY_TRACT | 3 refills | Status: DC

## 2018-06-11 MED ORDER — UMECLIDINIUM-VILANTEROL 62.5-25 MCG/INH IN AEPB: 1.0000 | INHALATION_SPRAY | Freq: Every day | RESPIRATORY_TRACT | 0 refills | Status: DC

## 2018-06-11 NOTE — Telephone Encounter (Signed)
Patient had requested another sample of Anoro and prescription sent to pharmacy. Patient stated that since starting Anoro, she feels that it works better. Her back has not hurt as bad, she feels that she is breathing better and easier. Sample placed up front and new prescription sent to preferred pharmacy. Patient stated understanding.  Nothing further at this time. Per BQ last OV 06/06/18. Take the sample of Anoro 1 puff daily, while taking this do not take Incruise.  After you have taken Anoro for a week call me to let me know if you think it is helpful and then I can change her prescription

## 2018-06-11 NOTE — Telephone Encounter (Signed)
Patient returned phone call. °

## 2018-06-11 NOTE — Addendum Note (Signed)
Addended by: Elton Sin on: 06/11/2018 10:39 AM   Modules accepted: Orders

## 2018-06-17 DIAGNOSIS — G8929 Other chronic pain: Secondary | ICD-10-CM | POA: Diagnosis not present

## 2018-06-17 DIAGNOSIS — Z0000 Encounter for general adult medical examination without abnormal findings: Secondary | ICD-10-CM | POA: Diagnosis not present

## 2018-06-17 DIAGNOSIS — R739 Hyperglycemia, unspecified: Secondary | ICD-10-CM | POA: Diagnosis not present

## 2018-06-17 DIAGNOSIS — I10 Essential (primary) hypertension: Secondary | ICD-10-CM | POA: Diagnosis not present

## 2018-06-17 DIAGNOSIS — E785 Hyperlipidemia, unspecified: Secondary | ICD-10-CM | POA: Diagnosis not present

## 2018-06-17 DIAGNOSIS — H9193 Unspecified hearing loss, bilateral: Secondary | ICD-10-CM | POA: Diagnosis not present

## 2018-06-17 DIAGNOSIS — M546 Pain in thoracic spine: Secondary | ICD-10-CM | POA: Diagnosis not present

## 2018-06-19 DIAGNOSIS — E785 Hyperlipidemia, unspecified: Secondary | ICD-10-CM | POA: Diagnosis not present

## 2018-06-19 DIAGNOSIS — R739 Hyperglycemia, unspecified: Secondary | ICD-10-CM | POA: Diagnosis not present

## 2018-07-03 ENCOUNTER — Ambulatory Visit (INDEPENDENT_AMBULATORY_CARE_PROVIDER_SITE_OTHER): Payer: Medicare Other | Admitting: Pharmacist Clinician (PhC)/ Clinical Pharmacy Specialist

## 2018-07-03 DIAGNOSIS — I480 Paroxysmal atrial fibrillation: Secondary | ICD-10-CM | POA: Diagnosis not present

## 2018-07-03 DIAGNOSIS — Z5181 Encounter for therapeutic drug level monitoring: Secondary | ICD-10-CM

## 2018-07-03 LAB — POCT INR: INR: 2.7 (ref 2.0–3.0)

## 2018-07-04 ENCOUNTER — Other Ambulatory Visit: Payer: Self-pay | Admitting: Cardiovascular Disease

## 2018-07-10 DIAGNOSIS — H6121 Impacted cerumen, right ear: Secondary | ICD-10-CM | POA: Diagnosis not present

## 2018-07-10 DIAGNOSIS — H903 Sensorineural hearing loss, bilateral: Secondary | ICD-10-CM | POA: Diagnosis not present

## 2018-07-18 DIAGNOSIS — L720 Epidermal cyst: Secondary | ICD-10-CM | POA: Diagnosis not present

## 2018-07-18 DIAGNOSIS — L923 Foreign body granuloma of the skin and subcutaneous tissue: Secondary | ICD-10-CM | POA: Diagnosis not present

## 2018-07-31 ENCOUNTER — Ambulatory Visit (INDEPENDENT_AMBULATORY_CARE_PROVIDER_SITE_OTHER): Payer: Medicare Other | Admitting: Pharmacist Clinician (PhC)/ Clinical Pharmacy Specialist

## 2018-07-31 DIAGNOSIS — Z5181 Encounter for therapeutic drug level monitoring: Secondary | ICD-10-CM

## 2018-07-31 DIAGNOSIS — I480 Paroxysmal atrial fibrillation: Secondary | ICD-10-CM

## 2018-07-31 LAB — POCT INR: INR: 2.2 (ref 2.0–3.0)

## 2018-08-01 ENCOUNTER — Other Ambulatory Visit: Payer: Self-pay | Admitting: Cardiovascular Disease

## 2018-08-02 ENCOUNTER — Other Ambulatory Visit: Payer: Self-pay | Admitting: Cardiovascular Disease

## 2018-08-02 MED ORDER — ISOSORBIDE MONONITRATE ER 30 MG PO TB24: 30.0000 mg | ORAL_TABLET | Freq: Two times a day (BID) | ORAL | 1 refills | Status: DC

## 2018-08-02 NOTE — Telephone Encounter (Signed)
Rx(s) sent to pharmacy electronically.  

## 2018-08-02 NOTE — Telephone Encounter (Signed)
New message    *STAT* If patient is at the pharmacy, call can be transferred to refill team.   1. Which medications need to be refilled? (please list name of each medication and dose if known) isosorbide mononitrate (IMDUR) 30 MG 24 hr tablet  2. Which pharmacy/location (including street and city if local pharmacy) is medication to be sent to?[facility] [place], [place]  3. Do they need a 30 day or 90 day supply? [place]

## 2018-08-06 DIAGNOSIS — H25811 Combined forms of age-related cataract, right eye: Secondary | ICD-10-CM | POA: Diagnosis not present

## 2018-08-06 DIAGNOSIS — H2512 Age-related nuclear cataract, left eye: Secondary | ICD-10-CM | POA: Diagnosis not present

## 2018-08-12 ENCOUNTER — Telehealth: Payer: Self-pay | Admitting: Acute Care

## 2018-08-12 NOTE — Telephone Encounter (Signed)
Spoke with pt and advised that due to lung cancer dx with lobectomy 20 2018 she will not qualify for lung cancer screening until she has been cancer free for 5 years.  Dr Lake Bells follows pt closely and will monitor if any symptoms arise.  Pt verbalized understanding.  Nothing further needed.

## 2018-08-13 ENCOUNTER — Ambulatory Visit (INDEPENDENT_AMBULATORY_CARE_PROVIDER_SITE_OTHER): Payer: Medicare Other | Admitting: Cardiovascular Disease

## 2018-08-13 ENCOUNTER — Encounter: Payer: Self-pay | Admitting: Cardiovascular Disease

## 2018-08-13 ENCOUNTER — Ambulatory Visit (HOSPITAL_COMMUNITY)
Admission: RE | Admit: 2018-08-13 | Discharge: 2018-08-13 | Disposition: A | Discharge status: Home / Self Care | Payer: Medicare Other | Source: Ambulatory Visit | Attending: Cardiovascular Disease | Admitting: Cardiovascular Disease

## 2018-08-13 VITALS — BP 100/50 | HR 52 | Ht 63.0 in | Wt 149.0 lb

## 2018-08-13 DIAGNOSIS — R0989 Other specified symptoms and signs involving the circulatory and respiratory systems: Secondary | ICD-10-CM

## 2018-08-13 DIAGNOSIS — I2510 Atherosclerotic heart disease of native coronary artery without angina pectoris: Secondary | ICD-10-CM

## 2018-08-13 DIAGNOSIS — E785 Hyperlipidemia, unspecified: Secondary | ICD-10-CM | POA: Diagnosis not present

## 2018-08-13 DIAGNOSIS — Z7901 Long term (current) use of anticoagulants: Secondary | ICD-10-CM

## 2018-08-13 DIAGNOSIS — I480 Paroxysmal atrial fibrillation: Secondary | ICD-10-CM

## 2018-08-13 DIAGNOSIS — R0609 Other forms of dyspnea: Secondary | ICD-10-CM

## 2018-08-13 DIAGNOSIS — R0600 Dyspnea, unspecified: Secondary | ICD-10-CM

## 2018-08-13 DIAGNOSIS — J432 Centrilobular emphysema: Secondary | ICD-10-CM | POA: Diagnosis not present

## 2018-08-13 DIAGNOSIS — I252 Old myocardial infarction: Secondary | ICD-10-CM

## 2018-08-13 DIAGNOSIS — I739 Peripheral vascular disease, unspecified: Secondary | ICD-10-CM | POA: Diagnosis not present

## 2018-08-13 MED ORDER — ROSUVASTATIN CALCIUM 10 MG PO TABS: 10.0000 mg | ORAL_TABLET | Freq: Every day | ORAL | 3 refills | Status: DC
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
## 2018-08-13 NOTE — Patient Instructions (Addendum)
Medication Instructions:  STOP simvastatin START rosuvastatin (Crestor) 10 mg daily Repatha- per pharmD  Testing/Procedures: Your physician has requested that you have a upper extremity arterial duplex. This test is an ultrasound of the arteries in the arms. It looks at arterial blood flow in the arms. Allow one hour for Upper Arterial scans. There are no restrictions or special instructions  Your physician has requested that you have a carotid duplex. This test is an ultrasound of the carotid arteries in your neck. It looks at blood flow through these arteries that supply the brain with blood. Allow one hour for this exam. There are no restrictions or special instructions.  Follow-Up: 3 months with Dr. Claiborne Billings  Any Other Special Instructions Will Be Listed Below (If Applicable).     If you need a refill on your cardiac medications before your next appointment, please call your pharmacy.

## 2018-08-14 ENCOUNTER — Other Ambulatory Visit: Payer: Self-pay | Admitting: *Deleted

## 2018-08-14 ENCOUNTER — Ambulatory Visit (INDEPENDENT_AMBULATORY_CARE_PROVIDER_SITE_OTHER): Payer: Medicare Other | Admitting: Pulmonary Disease

## 2018-08-14 ENCOUNTER — Encounter: Payer: Self-pay | Admitting: Pulmonary Disease

## 2018-08-14 VITALS — BP 138/70 | HR 58 | Ht 63.0 in | Wt 149.2 lb

## 2018-08-14 DIAGNOSIS — I2510 Atherosclerotic heart disease of native coronary artery without angina pectoris: Secondary | ICD-10-CM

## 2018-08-14 DIAGNOSIS — J432 Centrilobular emphysema: Secondary | ICD-10-CM

## 2018-08-14 DIAGNOSIS — Z87891 Personal history of nicotine dependence: Secondary | ICD-10-CM

## 2018-08-14 DIAGNOSIS — R400 Somnolence: Secondary | ICD-10-CM | POA: Diagnosis not present

## 2018-08-14 DIAGNOSIS — I739 Peripheral vascular disease, unspecified: Secondary | ICD-10-CM

## 2018-08-14 DIAGNOSIS — I2583 Coronary atherosclerosis due to lipid rich plaque: Secondary | ICD-10-CM | POA: Diagnosis not present
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
## 2018-08-14 NOTE — Progress Notes (Signed)
vas 

## 2018-08-14 NOTE — Patient Instructions (Signed)
COPD: Continue [place] daily Please get a high-dose flu shot Practice good hand hygiene Stay active Try using albuterol prior to exercise to see if it helps with shortness of breath and stamina [place]  History of lung cancer: Continue follow-up with thoracic surgery  Increasing fatigue, daytime somnolence: New problem We will arrange for a home sleep test because I fear you may have obstructive sleep apnea  We will see you back in 4 to 6 weeks to go over the results of the sleep study, nurse practitioner visit okay if I am not available

## 2018-08-15 ENCOUNTER — Encounter (HOSPITAL_COMMUNITY): Payer: Medicare Other

## 2018-08-16 ENCOUNTER — Other Ambulatory Visit: Payer: Self-pay | Admitting: Cardiovascular Disease

## 2018-08-16 NOTE — Telephone Encounter (Signed)
Rx request sent to pharmacy.  

## 2018-08-20 ENCOUNTER — Encounter: Payer: Self-pay | Admitting: Cardiovascular Disease

## 2018-08-20 DIAGNOSIS — H25811 Combined forms of age-related cataract, right eye: Secondary | ICD-10-CM | POA: Diagnosis not present

## 2018-08-20 DIAGNOSIS — H2511 Age-related nuclear cataract, right eye: Secondary | ICD-10-CM | POA: Diagnosis not present

## 2018-08-21 ENCOUNTER — Telehealth: Payer: Self-pay | Admitting: *Deleted

## 2018-08-21 NOTE — Telephone Encounter (Signed)
   [facility] Pre-operative Risk Assessment    Request for surgical clearance:  1. What type of surgery is being performed? Cataract    2. When is this surgery scheduled? 08/20/18? And 09/24/18   3. What type of clearance is required (medical clearance vs. Pharmacy clearance to hold med vs. Both)? medical  4. Are there any medications that need to be held prior to surgery and how long?    5. Practice name and name of physician performing surgery? [facility] Dr. Bennie Dallas   6. What is your office phone number [phone]    7.   What is your office fax number [phone]  8.   Anesthesia type (None, local, MAC, general) ?    [name] [name] 08/21/2018, 12:45 PM  _________________________________________________________________   (provider comments below)

## 2018-08-21 NOTE — Telephone Encounter (Signed)
   Primary Cardiologist: Shelva Majestic, MD  Chart reviewed as part of pre-operative protocol coverage. Cataract extractions are recognized in guidelines as low risk surgeries that do not typically require specific preoperative testing or holding of blood thinner therapy. Therefore, given past medical history and time since last visit, based on ACC/AHA guidelines, WADIE MATTIE would be at acceptable risk for the planned procedure without further cardiovascular testing.   I will route this recommendation to the requesting party via Epic fax function and remove from pre-op pool.  Please call with questions.  [place], [place] 08/21/2018, 4:26 PM

## 2018-08-23 ENCOUNTER — Other Ambulatory Visit: Payer: Self-pay | Admitting: Cardiovascular Disease

## 2018-08-23 DIAGNOSIS — I998 Other disorder of circulatory system: Secondary | ICD-10-CM

## 2018-08-23 DIAGNOSIS — I739 Peripheral vascular disease, unspecified: Secondary | ICD-10-CM

## 2018-08-26 ENCOUNTER — Telehealth: Payer: Self-pay | Admitting: Pulmonary Disease

## 2018-08-26 NOTE — Telephone Encounter (Signed)
Called and spoke with pt who stated she had received a call stating to her when to pick up the machine for the sleep study but she cannot remember when she was supposed to come by.  [facility], please advise on this for pt. Thanks!

## 2018-08-27 ENCOUNTER — Ambulatory Visit (HOSPITAL_COMMUNITY)
Admission: RE | Admit: 2018-08-27 | Discharge: 2018-08-27 | Disposition: A | Discharge status: Home / Self Care | Payer: Medicare Other | Source: Ambulatory Visit | Attending: Cardiovascular Disease | Admitting: Cardiovascular Disease

## 2018-08-27 ENCOUNTER — Ambulatory Visit (INDEPENDENT_AMBULATORY_CARE_PROVIDER_SITE_OTHER): Payer: Medicare Other | Admitting: Pharmacist Clinician (PhC)/ Clinical Pharmacy Specialist

## 2018-08-27 ENCOUNTER — Ambulatory Visit (HOSPITAL_BASED_OUTPATIENT_CLINIC_OR_DEPARTMENT_OTHER)
Admission: RE | Admit: 2018-08-27 | Discharge: 2018-08-27 | Disposition: A | Discharge status: Home / Self Care | Payer: Medicare Other | Source: Ambulatory Visit | Attending: Cardiovascular Disease | Admitting: Cardiovascular Disease

## 2018-08-27 DIAGNOSIS — I998 Other disorder of circulatory system: Secondary | ICD-10-CM | POA: Diagnosis not present

## 2018-08-27 DIAGNOSIS — R0989 Other specified symptoms and signs involving the circulatory and respiratory systems: Secondary | ICD-10-CM | POA: Diagnosis not present

## 2018-08-27 DIAGNOSIS — I739 Peripheral vascular disease, unspecified: Secondary | ICD-10-CM | POA: Diagnosis not present

## 2018-08-27 DIAGNOSIS — I480 Paroxysmal atrial fibrillation: Secondary | ICD-10-CM | POA: Diagnosis not present

## 2018-08-27 DIAGNOSIS — Z5181 Encounter for therapeutic drug level monitoring: Secondary | ICD-10-CM

## 2018-08-27 DIAGNOSIS — I483 Typical atrial flutter: Secondary | ICD-10-CM

## 2018-08-27 LAB — POCT INR: INR: 2.5 (ref 2.0–3.0)

## 2018-08-27 NOTE — Telephone Encounter (Signed)
Pt is scheduled for 10/17 at 2:30 with Vallarie Mare.  Called pt & gave her appt info.  Nothing further needed.

## 2018-08-28 NOTE — Telephone Encounter (Signed)
Nothing needed at this time.  

## 2018-08-29 ENCOUNTER — Other Ambulatory Visit: Payer: Self-pay | Admitting: Pharmacist Clinician (PhC)/ Clinical Pharmacy Specialist

## 2018-08-29 MED ORDER — ALIROCUMAB 75 MG/ML ~~LOC~~ SOPN: 75.0000 mg | PEN_INJECTOR | SUBCUTANEOUS | 12 refills | Status: DC

## 2018-09-02 ENCOUNTER — Telehealth: Payer: Self-pay | Admitting: Cardiovascular Disease

## 2018-09-02 NOTE — Telephone Encounter (Signed)
Follow Up:     Please call, she would like somebody to explain her doppler results from 08-13-18.

## 2018-09-02 NOTE — Telephone Encounter (Signed)
Results were explained to the patient regarding her recent cardiology testing.

## 2018-09-03 DIAGNOSIS — G4733 Obstructive sleep apnea (adult) (pediatric): Secondary | ICD-10-CM | POA: Diagnosis not present

## 2018-09-04 ENCOUNTER — Other Ambulatory Visit: Payer: Self-pay | Admitting: *Deleted

## 2018-09-04 DIAGNOSIS — R400 Somnolence: Secondary | ICD-10-CM

## 2018-09-05 ENCOUNTER — Telehealth: Payer: Self-pay | Admitting: Pulmonary Disease

## 2018-09-05 DIAGNOSIS — G4733 Obstructive sleep apnea (adult) (pediatric): Secondary | ICD-10-CM | POA: Diagnosis not present

## 2018-09-05 NOTE — Telephone Encounter (Signed)
Called and spoke with pt who stated she had questions in regards to different diagnoses of herself.  I advised pt that Lazaro Arms, NP could be able to answer any questions she has at that [place]. Pt expressed understanding. Nothing further needed.

## 2018-09-06 ENCOUNTER — Telehealth: Payer: Self-pay | Admitting: Pulmonary Disease

## 2018-09-06 DIAGNOSIS — G4733 Obstructive sleep apnea (adult) (pediatric): Secondary | ICD-10-CM

## 2018-09-06 DIAGNOSIS — R400 Somnolence: Secondary | ICD-10-CM

## 2018-09-06 NOTE — Telephone Encounter (Signed)
OK to Rx CPAP per Dr. Ander Slade recommendation

## 2018-09-06 NOTE — Telephone Encounter (Signed)
Dr. Ander Slade has reviewed the home sleep test this showed very mild sleep apnea.No significant oxygen desaturations.   Recommendations   Sleep posting omtimization by encouraging sleep in a lateral position elevating the head of the bed by 30 degrees may help.  Cpap therapy maybe considered if patient has significant daytime symptoms that can be ascribed to be an effect of sleep disordered breathing.   Auto - CPAP with the settings auto 5 to 15. If chosen treatment. Regular exercises will help premote good  Quality sleep .   Advise against driving while sleepy & against medication with sedative side effects.    I will route  this to Doctor McQuaid to advise.

## 2018-09-09 NOTE — Telephone Encounter (Signed)
Called and spoke to patient, made aware of HST results, okay to place order for cpap. 2 month f/u appt made. Voiced understanding. Nothing further is needed at this time.

## 2018-09-12 ENCOUNTER — Other Ambulatory Visit: Payer: Self-pay | Admitting: Cardiovascular Disease

## 2018-09-12 NOTE — Telephone Encounter (Signed)
Rx has been sent to the pharmacy electronically. ° °

## 2018-09-16 DIAGNOSIS — Z23 Encounter for immunization: Secondary | ICD-10-CM | POA: Diagnosis not present

## 2018-09-18 ENCOUNTER — Ambulatory Visit (INDEPENDENT_AMBULATORY_CARE_PROVIDER_SITE_OTHER): Payer: Medicare Other | Admitting: Nurse Practitioner

## 2018-09-18 ENCOUNTER — Telehealth: Payer: Self-pay | Admitting: Cardiovascular Disease

## 2018-09-18 ENCOUNTER — Encounter: Payer: Self-pay | Admitting: Nurse Practitioner

## 2018-09-18 VITALS — BP 126/70 | HR 60 | Ht 63.0 in | Wt 147.6 lb

## 2018-09-18 DIAGNOSIS — G4733 Obstructive sleep apnea (adult) (pediatric): Secondary | ICD-10-CM

## 2018-09-18 MED ORDER — EZETIMIBE 10 MG PO TABS: ORAL_TABLET | ORAL | 3 refills | Status: DC

## 2018-09-18 NOTE — Patient Instructions (Addendum)
Discussed recent HST and treatment options  Order CPAP AutoSet 5-15 cmH20  Mask of choice - mask fitting  Discussion: Obesity. - discussed how weight can impact sleep and risk for sleep disordered breathing - discussed options to assist with weight loss: combination of diet modification, cardiovascular and strength training exercises   Cardiovascular risk. - had an extensive discussion regarding the adverse health consequences related to untreated sleep disordered breathing - specifically discussed the risks for hypertension, coronary artery disease, cardiac dysrhythmias, cerebrovascular disease, and diabetes - lifestyle modification discussed   Safe driving practices. - discussed how sleep disruption can increase risk of accidents, particularly when driving - safe driving practices were discussed   Therapies for obstructive sleep apnea. - if the sleep study shows significant sleep apnea, then various therapies for treatment were reviewed: CPAP, oral appliance, and surgical interventions     CPAP and BiPAP Information CPAP and BiPAP are methods of helping a person breathe with the use of air pressure. CPAP stands for "continuous positive airway pressure." BiPAP stands for "bi-level positive airway pressure." In both methods, air is blown through your nose or mouth and into your air passages to help you breathe well. CPAP and BiPAP use different amounts of pressure to blow air. With CPAP, the amount of pressure stays the same while you breathe in and out. With BiPAP, the amount of pressure is increased when you breathe in (inhale) so that you can take larger breaths. Your health care provider will recommend whether CPAP or BiPAP would be more helpful for you. Why are CPAP and BiPAP treatments used? CPAP or BiPAP can be helpful if you have:  Sleep apnea.  Chronic obstructive pulmonary disease (COPD).  Heart failure.  Medical conditions that weaken the muscles of the chest including  muscular dystrophy, or neurological diseases such as amyotrophic lateral sclerosis (ALS).  Other problems that cause breathing to be weak, abnormal, or difficult.  CPAP is most commonly used for obstructive sleep apnea (OSA) to keep the airways from collapsing when the muscles relax during sleep. How is CPAP or BiPAP administered? Both CPAP and BiPAP are provided by a small machine with a flexible plastic tube that attaches to a plastic mask. You wear the mask. Air is blown through the mask into your nose or mouth. The amount of pressure that is used to blow the air can be adjusted on the machine. Your health care provider will determine the pressure setting that should be used based on your individual needs. When should CPAP or BiPAP be used? In most cases, the mask only needs to be worn during sleep. Generally, the mask needs to be worn throughout the night and during any daytime naps. People with certain medical conditions may also need to wear the mask at other times when they are awake. Follow instructions from your health care provider about when to use the machine. What are some tips for using the mask?  Because the mask needs to be snug, some people feel trapped or closed-in (claustrophobic) when first using the mask. If you feel this way, you may need to get used to the mask. One way to do this is by holding the mask loosely over your nose or mouth and then gradually applying the mask more snugly. You can also gradually increase the amount of time that you use the mask.  Masks are available in various types and sizes. Some fit over your mouth and nose while others fit over just your nose. If your  mask does not fit well, talk with your health care provider about getting a different one.  If you are using a mask that fits over your nose and you tend to breathe through your mouth, a chin strap may be applied to help keep your mouth closed.  The CPAP and BiPAP machines have alarms that may  sound if the mask comes off or develops a leak.  If you have trouble with the mask, it is very important that you talk with your health care provider about finding a way to make the mask easier to tolerate. Do not stop using the mask. Stopping the use of the mask could have a negative impact on your health. What are some tips for using the machine?  Place your CPAP or BiPAP machine on a secure table or stand near an electrical outlet.  Know where the on/off switch is located on the machine.  Follow instructions from your health care provider about how to set the pressure on your machine and when you should use it.  Do not eat or drink while the CPAP or BiPAP machine is on. Food or fluids could get pushed into your lungs by the pressure of the CPAP or BiPAP.  Do not smoke. Tobacco smoke residue can damage the machine.  For home use, CPAP and BiPAP machines can be rented or purchased through home health care companies. Many different brands of machines are available. Renting a machine before purchasing may help you find out which particular machine works well for you.  Keep the CPAP or BiPAP machine and attachments clean. Ask your health care provider for specific instructions. Get help right away if:  You have redness or open areas around your nose or mouth where the mask fits.  You have trouble using the CPAP or BiPAP machine.  You cannot tolerate wearing the CPAP or BiPAP mask.  You have pain, discomfort, and bloating in your abdomen. Summary  CPAP and BiPAP are methods of helping a person breathe with the use of air pressure.  Both CPAP and BiPAP are provided by a small machine with a flexible plastic tube that attaches to a plastic mask.  If you have trouble with the mask, it is very important that you talk with your health care provider about finding a way to make the mask easier to tolerate. This information is not intended to replace advice given to you by your health care  provider. Make sure you discuss any questions you have with your health care provider. Document Released: 07/28/2004 Document Revised: 09/18/2016 Document Reviewed: 09/18/2016 Elsevier Interactive Patient Education  2017 [place].  Follow up 1 month after getting CPAP

## 2018-09-18 NOTE — Telephone Encounter (Signed)
Returned call to patient, she needs new rx for Zetia sent to [facility] in [place].     New rx sent. Patient aware and verbalized understanding.

## 2018-09-18 NOTE — Progress Notes (Signed)
Reviewed, agree 

## 2018-09-18 NOTE — Assessment & Plan Note (Addendum)
Patient Instructions  Discussed recent HST and treatment options  Order CPAP AutoSet 5-15 cmH20  Mask of choice - mask fitting  Discussion: Obesity. - discussed how weight can impact sleep and risk for sleep disordered breathing - discussed options to assist with weight loss: combination of diet modification, cardiovascular and strength training exercises   Cardiovascular risk. - had an extensive discussion regarding the adverse health consequences related to untreated sleep disordered breathing - specifically discussed the risks for hypertension, coronary artery disease, cardiac dysrhythmias, cerebrovascular disease, and diabetes - lifestyle modification discussed   Safe driving practices. - discussed how sleep disruption can increase risk of accidents, particularly when driving - safe driving practices were discussed   Therapies for obstructive sleep apnea. - if the sleep study shows significant sleep apnea, then various therapies for treatment were reviewed: CPAP, oral appliance, and surgical interventions     CPAP and BiPAP Information CPAP and BiPAP are methods of helping a person breathe with the use of air pressure. CPAP stands for "continuous positive airway pressure." BiPAP stands for "bi-level positive airway pressure." In both methods, air is blown through your nose or mouth and into your air passages to help you breathe well. CPAP and BiPAP use different amounts of pressure to blow air. With CPAP, the amount of pressure stays the same while you breathe in and out. With BiPAP, the amount of pressure is increased when you breathe in (inhale) so that you can take larger breaths. Your health care provider will recommend whether CPAP or BiPAP would be more helpful for you. Why are CPAP and BiPAP treatments used? CPAP or BiPAP can be helpful if you have:  Sleep apnea.  Chronic obstructive pulmonary disease (COPD).  Heart failure.  Medical conditions that weaken the muscles  of the chest including muscular dystrophy, or neurological diseases such as amyotrophic lateral sclerosis (ALS).  Other problems that cause breathing to be weak, abnormal, or difficult.  CPAP is most commonly used for obstructive sleep apnea (OSA) to keep the airways from collapsing when the muscles relax during sleep. How is CPAP or BiPAP administered? Both CPAP and BiPAP are provided by a small machine with a flexible plastic tube that attaches to a plastic mask. You wear the mask. Air is blown through the mask into your nose or mouth. The amount of pressure that is used to blow the air can be adjusted on the machine. Your health care provider will determine the pressure setting that should be used based on your individual needs. When should CPAP or BiPAP be used? In most cases, the mask only needs to be worn during sleep. Generally, the mask needs to be worn throughout the night and during any daytime naps. People with certain medical conditions may also need to wear the mask at other times when they are awake. Follow instructions from your health care provider about when to use the machine. What are some tips for using the mask?  Because the mask needs to be snug, some people feel trapped or closed-in (claustrophobic) when first using the mask. If you feel this way, you may need to get used to the mask. One way to do this is by holding the mask loosely over your nose or mouth and then gradually applying the mask more snugly. You can also gradually increase the amount of time that you use the mask.  Masks are available in various types and sizes. Some fit over your mouth and nose while others fit over just your  nose. If your mask does not fit well, talk with your health care provider about getting a different one.  If you are using a mask that fits over your nose and you tend to breathe through your mouth, a chin strap may be applied to help keep your mouth closed.  The CPAP and BiPAP machines  have alarms that may sound if the mask comes off or develops a leak.  If you have trouble with the mask, it is very important that you talk with your health care provider about finding a way to make the mask easier to tolerate. Do not stop using the mask. Stopping the use of the mask could have a negative impact on your health. What are some tips for using the machine?  Place your CPAP or BiPAP machine on a secure table or stand near an electrical outlet.  Know where the on/off switch is located on the machine.  Follow instructions from your health care provider about how to set the pressure on your machine and when you should use it.  Do not eat or drink while the CPAP or BiPAP machine is on. Food or fluids could get pushed into your lungs by the pressure of the CPAP or BiPAP.  Do not smoke. Tobacco smoke residue can damage the machine.  For home use, CPAP and BiPAP machines can be rented or purchased through home health care companies. Many different brands of machines are available. Renting a machine before purchasing may help you find out which particular machine works well for you.  Keep the CPAP or BiPAP machine and attachments clean. Ask your health care provider for specific instructions. Get help right away if:  You have redness or open areas around your nose or mouth where the mask fits.  You have trouble using the CPAP or BiPAP machine.  You cannot tolerate wearing the CPAP or BiPAP mask.  You have pain, discomfort, and bloating in your abdomen. Summary  CPAP and BiPAP are methods of helping a person breathe with the use of air pressure.  Both CPAP and BiPAP are provided by a small machine with a flexible plastic tube that attaches to a plastic mask.  If you have trouble with the mask, it is very important that you talk with your health care provider about finding a way to make the mask easier to tolerate. This information is not intended to replace advice given to you by  your health care provider. Make sure you discuss any questions you have with your health care provider. Document Released: 07/28/2004 Document Revised: 09/18/2016 Document Reviewed: 09/18/2016 Elsevier Interactive Patient Education  2017 [place].  Follow up 1 month after getting CPAP

## 2018-09-18 NOTE — Telephone Encounter (Signed)
° °  Pt c/o medication issue:  1. Name of Medication: ezetimibe (ZETIA) 10 MG tablet  2. How are you currently taking this medication (dosage and times per day)? As written  3. Are you having a reaction (difficulty breathing--STAT)? No   4. What is your medication issue?  Should patient continue Zetia and Crestor. Please call
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
## 2018-09-20 ENCOUNTER — Telehealth: Payer: Self-pay | Admitting: Pulmonary Disease

## 2018-09-20 DIAGNOSIS — G4733 Obstructive sleep apnea (adult) (pediatric): Secondary | ICD-10-CM

## 2018-09-20 NOTE — Telephone Encounter (Signed)
Spoke with pt, she states her order was sent to [facility] for a CPAP. She states she cannot have an appt to get training for her Cpap until 10/06/2018.[name] She also stated [facility] was calling the wrong number and left a message about another patient. They called her daughter's number instead of hers and she just doesn't want to go with them. Can we write an order for another DME company other than [facility]? Please advise if it's ok to switch DME company.   Assessment & Plan:   OSA (obstructive sleep apnea) Patient Instructions  Discussed recent HST and treatment options  Order CPAP AutoSet 5-15 cmH20  Mask of choice - mask fitting  Discussion: Obesity. - discussed how weight can impact sleep and risk for sleep disordered breathing - discussed options to assist with weight loss: combination of diet modification, cardiovascular and strength training exercises  Cardiovascular risk. - had an extensive discussion regarding the adverse health consequences related to untreated sleep disordered breathing - specifically discussed the risks for hypertension, coronary artery disease, cardiac dysrhythmias, cerebrovascular disease, and diabetes - lifestyle modification discussed  Safe driving practices. - discussed how sleep disruption can increase risk of accidents, particularly when driving - safe driving practices were discussed  Therapies for obstructive sleep apnea. - if the sleep study shows significant sleep apnea, then various therapies for treatment were reviewed: CPAP, oral appliance, and surgical interventions

## 2018-09-23 ENCOUNTER — Ambulatory Visit (INDEPENDENT_AMBULATORY_CARE_PROVIDER_SITE_OTHER): Payer: Medicare Other | Admitting: Pharmacist Clinician (PhC)/ Clinical Pharmacy Specialist

## 2018-09-23 ENCOUNTER — Other Ambulatory Visit: Payer: Self-pay | Admitting: Pharmacist Clinician (PhC)/ Clinical Pharmacy Specialist

## 2018-09-23 DIAGNOSIS — I483 Typical atrial flutter: Secondary | ICD-10-CM | POA: Diagnosis not present

## 2018-09-23 DIAGNOSIS — Z5181 Encounter for therapeutic drug level monitoring: Secondary | ICD-10-CM

## 2018-09-23 DIAGNOSIS — I480 Paroxysmal atrial fibrillation: Secondary | ICD-10-CM

## 2018-09-23 LAB — POCT INR: INR: 3.1 — AB (ref 2.0–3.0)

## 2018-09-23 MED ORDER — WARFARIN SODIUM 2.5 MG PO TABS: ORAL_TABLET | ORAL | 1 refills | Status: DC

## 2018-09-23 NOTE — Telephone Encounter (Signed)
Yes. Please switch to which ever DME company she would prefer.

## 2018-09-23 NOTE — Telephone Encounter (Signed)
Ok order placed. PCC's can you call pt after you have picked another DME company for her. Thanks

## 2018-09-23 NOTE — Telephone Encounter (Signed)
Left message for patient to call back for clarification.

## 2018-09-23 NOTE — Telephone Encounter (Signed)
The order that was placed is a little confusing.  It is asking [place] to switch from  [facility] to another dme but there is a message on the order for [place] to send to [facility].  If we need to change from [facility] there shouldn't be a note for them on the order??  Which are we supposed to do?

## 2018-09-23 NOTE — Telephone Encounter (Signed)
Patient called needing a new DME other than [facility] for her C-pap machine.( Pt # [phone])

## 2018-09-24 DIAGNOSIS — H2512 Age-related nuclear cataract, left eye: Secondary | ICD-10-CM | POA: Diagnosis not present

## 2018-09-24 DIAGNOSIS — H25812 Combined forms of age-related cataract, left eye: Secondary | ICD-10-CM | POA: Diagnosis not present

## 2018-09-25 ENCOUNTER — Telehealth: Payer: Self-pay | Admitting: Cardiovascular Disease

## 2018-09-25 NOTE — Telephone Encounter (Signed)
Attempted to contact pt. I did not receive an answer. I have left a message for pt to return our call.  

## 2018-09-25 NOTE — Telephone Encounter (Signed)
Patient returned call, CB is [phone].  She states she is wanting to go through another DME besides [facility] for CPAP order.  She has no preference for DME, just does not want [facility].

## 2018-09-25 NOTE — Telephone Encounter (Signed)
New message   *STAT* If patient is at the pharmacy, call can be transferred to refill team.   1. Which medications need to be refilled? (please list name of each medication and dose if known)  warfarin (COUMADIN) 2.5 MG tablet  2. Which pharmacy/location (including street and city if local pharmacy) is medication to be sent to?[facility] [place], [place]  3. Do they need a 30 day or 90 day supply? [place]

## 2018-09-25 NOTE — Telephone Encounter (Signed)
Re-route to refills

## 2018-09-25 NOTE — Telephone Encounter (Signed)
Called and spoke with patient. She will be going with [facility]. Order to D/C from [facility] and new order sent in for [facility]. Nothing further needed.

## 2018-09-25 NOTE — Telephone Encounter (Signed)
Done after INR check this week

## 2018-09-30 DIAGNOSIS — R7309 Other abnormal glucose: Secondary | ICD-10-CM | POA: Diagnosis not present

## 2018-10-04 ENCOUNTER — Other Ambulatory Visit: Payer: Self-pay | Admitting: Internal Medicine

## 2018-10-07 ENCOUNTER — Telehealth: Payer: Self-pay | Admitting: Cardiovascular Disease

## 2018-10-07 NOTE — Telephone Encounter (Signed)
New Message  Pt states she only has one pill left  *STAT* If patient is at the pharmacy, call can be transferred to refill team.   1. Which medications need to be refilled? (please list name of each medication and dose if known) metoprolol succinate (TOPROL-XL) 25 MG 24 hr tablet  2. Which pharmacy/location (including street and city if local pharmacy) is medication to be sent to?  [facility] [place], [place]  3. Do they need a 30 day or 90 day supply? [place]

## 2018-10-08 ENCOUNTER — Telehealth: Payer: Self-pay | Admitting: Physician Assistant

## 2018-10-08 MED ORDER — METOPROLOL TARTRATE 25 MG PO TABS: 25.0000 mg | ORAL_TABLET | Freq: Every day | ORAL | 12 refills | Status: DC | PRN

## 2018-10-08 NOTE — Telephone Encounter (Signed)
Patient called because she had gone back into atrial fibrillation.  At first her heart rate was 125, but she had taken an extra metoprolol, and now it is down into the 90s.  Her blood pressure is stable.  She is not particularly symptomatic with it.  At first she was concerned, but now is feeling more comfortable since her heart rate is improving.  She recently had cataract surgery, but has had no bleeding issues and did not have to hold her Coumadin for this.  At this time, she is comfortable and has no additional concerns.  Rosaria Ferries, PA-C 10/08/2018 8:05 PM [place] [phone]

## 2018-10-09 ENCOUNTER — Other Ambulatory Visit: Payer: Self-pay | Admitting: Pulmonary Disease

## 2018-10-09 DIAGNOSIS — M3501 Sicca syndrome with keratoconjunctivitis: Secondary | ICD-10-CM | POA: Diagnosis not present

## 2018-10-09 DIAGNOSIS — Z961 Presence of intraocular lens: Secondary | ICD-10-CM | POA: Diagnosis not present

## 2018-10-16 ENCOUNTER — Telehealth: Payer: Self-pay | Admitting: Internal Medicine

## 2018-10-16 NOTE — Telephone Encounter (Signed)
Pt called to report that she has felt that she is in and out of afib the last few days more than usual.. Only lasting 15-30 minutes each time... She has some sob and a little dizziness but not significant according to her.. She is taking all of her meds.. She has taken her prn metoprolol and it helps some but it seems to return.  I advised her that if she starts feeling worse to let us know but I will forward to Dr. Caryl Comes for his recommendations but most important that she continues to keep taking her meds especially her coumadin.  Pt verbalized understanding and agrees.

## 2018-10-16 NOTE — Telephone Encounter (Signed)
Ann May be most helpful to think about having her seen in afib clinic as I am going to be out next week and Friday is the short day  Butch Penny would wonder about triggers, as her past hx has been complicated but otherwise may need AAD   Thanks steve

## 2018-10-16 NOTE — Telephone Encounter (Signed)
Patient c/o Palpitations:  High priority if patient c/o lightheadedness, shortness of breath, or chest pain  1) How long have you had palpitations/irregular HR/ Afib? Are you having the symptoms now? Started last Wednesday--yes, started on Monday around and today around 4:00-  2) Are you currently experiencing lightheadedness, SOB or CP? Only when she exerts herself, sensitive to light 3) Do you have a history of afib (atrial fibrillation) or irregular heart rhythm? yes  4) Have you checked your BP or HR? (document readings if available): a little on the high side, but when in Afib it is low  5) Are you experiencing any other symptoms? extremely fatigued

## 2018-10-17 NOTE — Telephone Encounter (Signed)
Pt agreed to see Roderic Palau next week 12/11@1045 . She was c/o hypotension when she takes her metoprolol. I advised her to hold her lisinopril at the times she needs to take her metoprolol. Pt verbalized understanding and had no additional questions.

## 2018-10-18 ENCOUNTER — Other Ambulatory Visit: Payer: Self-pay | Admitting: Family Medicine

## 2018-10-18 ENCOUNTER — Other Ambulatory Visit (HOSPITAL_COMMUNITY): Payer: Self-pay | Admitting: Family Medicine

## 2018-10-18 DIAGNOSIS — M546 Pain in thoracic spine: Principal | ICD-10-CM

## 2018-10-18 DIAGNOSIS — G8929 Other chronic pain: Secondary | ICD-10-CM

## 2018-10-21 ENCOUNTER — Ambulatory Visit (INDEPENDENT_AMBULATORY_CARE_PROVIDER_SITE_OTHER): Payer: Medicare Other | Admitting: Pharmacist Clinician (PhC)/ Clinical Pharmacy Specialist

## 2018-10-21 DIAGNOSIS — I483 Typical atrial flutter: Secondary | ICD-10-CM

## 2018-10-21 DIAGNOSIS — Z5181 Encounter for therapeutic drug level monitoring: Secondary | ICD-10-CM | POA: Diagnosis not present

## 2018-10-21 DIAGNOSIS — I480 Paroxysmal atrial fibrillation: Secondary | ICD-10-CM | POA: Diagnosis not present

## 2018-10-21 LAB — POCT INR: INR: 2.3 (ref 2.0–3.0)

## 2018-10-21 NOTE — Patient Instructions (Signed)
Description   Continue with 1 tablet daily except 1/2 tablet each Sunday. Repeat INR in 4 weeks

## 2018-10-23 ENCOUNTER — Encounter (HOSPITAL_COMMUNITY): Payer: Self-pay | Admitting: Nurse Practitioner

## 2018-10-23 ENCOUNTER — Ambulatory Visit (HOSPITAL_COMMUNITY)
Admission: RE | Admit: 2018-10-23 | Discharge: 2018-10-23 | Disposition: A | Discharge status: Home / Self Care | Payer: Medicare Other | Source: Ambulatory Visit | Attending: Nurse Practitioner | Admitting: Nurse Practitioner

## 2018-10-23 VITALS — BP 132/88 | HR 58 | Ht 63.0 in | Wt 148.0 lb

## 2018-10-23 DIAGNOSIS — Z951 Presence of aortocoronary bypass graft: Secondary | ICD-10-CM | POA: Diagnosis not present

## 2018-10-23 DIAGNOSIS — I2510 Atherosclerotic heart disease of native coronary artery without angina pectoris: Secondary | ICD-10-CM | POA: Diagnosis not present

## 2018-10-23 DIAGNOSIS — Z888 Allergy status to other drugs, medicaments and biological substances status: Secondary | ICD-10-CM | POA: Insufficient documentation

## 2018-10-23 DIAGNOSIS — Z7901 Long term (current) use of anticoagulants: Secondary | ICD-10-CM | POA: Diagnosis not present

## 2018-10-23 DIAGNOSIS — Z91040 Latex allergy status: Secondary | ICD-10-CM | POA: Insufficient documentation

## 2018-10-23 DIAGNOSIS — I252 Old myocardial infarction: Secondary | ICD-10-CM | POA: Diagnosis not present

## 2018-10-23 DIAGNOSIS — Z79899 Other long term (current) drug therapy: Secondary | ICD-10-CM | POA: Diagnosis not present

## 2018-10-23 DIAGNOSIS — Z881 Allergy status to other antibiotic agents status: Secondary | ICD-10-CM | POA: Insufficient documentation

## 2018-10-23 DIAGNOSIS — Z87891 Personal history of nicotine dependence: Secondary | ICD-10-CM | POA: Diagnosis not present

## 2018-10-23 DIAGNOSIS — I10 Essential (primary) hypertension: Secondary | ICD-10-CM | POA: Diagnosis not present

## 2018-10-23 DIAGNOSIS — I4510 Unspecified right bundle-branch block: Secondary | ICD-10-CM | POA: Diagnosis not present

## 2018-10-23 DIAGNOSIS — I480 Paroxysmal atrial fibrillation: Secondary | ICD-10-CM | POA: Diagnosis not present

## 2018-10-23 DIAGNOSIS — J449 Chronic obstructive pulmonary disease, unspecified: Secondary | ICD-10-CM | POA: Insufficient documentation

## 2018-10-23 DIAGNOSIS — Z7982 Long term (current) use of aspirin: Secondary | ICD-10-CM | POA: Diagnosis not present

## 2018-10-23 DIAGNOSIS — Z8249 Family history of ischemic heart disease and other diseases of the circulatory system: Secondary | ICD-10-CM | POA: Insufficient documentation

## 2018-10-23 DIAGNOSIS — R001 Bradycardia, unspecified: Secondary | ICD-10-CM | POA: Diagnosis not present

## 2018-10-23 NOTE — Progress Notes (Signed)
error 
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
## 2018-10-29 ENCOUNTER — Ambulatory Visit (INDEPENDENT_AMBULATORY_CARE_PROVIDER_SITE_OTHER): Payer: Medicare Other | Admitting: Pharmacist

## 2018-10-29 DIAGNOSIS — I480 Paroxysmal atrial fibrillation: Secondary | ICD-10-CM | POA: Diagnosis not present

## 2018-10-29 DIAGNOSIS — Z5181 Encounter for therapeutic drug level monitoring: Secondary | ICD-10-CM

## 2018-10-29 LAB — POCT INR: INR: 3.5 — AB (ref 2.0–3.0)

## 2018-10-29 NOTE — Patient Instructions (Signed)
Description   Take only 1/2 tablet today then continue with 1 tablet daily except 1/2 tablet each Sunday. Repeat INR in 1 week for possible Tikosyn admission.

## 2018-10-30 ENCOUNTER — Ambulatory Visit (HOSPITAL_COMMUNITY)
Admission: RE | Admit: 2018-10-30 | Discharge: 2018-10-30 | Disposition: A | Discharge status: Home / Self Care | Payer: Medicare Other | Source: Ambulatory Visit | Attending: Family Medicine | Admitting: Family Medicine

## 2018-10-30 DIAGNOSIS — J90 Pleural effusion, not elsewhere classified: Secondary | ICD-10-CM | POA: Insufficient documentation

## 2018-10-30 DIAGNOSIS — G8929 Other chronic pain: Secondary | ICD-10-CM

## 2018-10-30 DIAGNOSIS — M546 Pain in thoracic spine: Secondary | ICD-10-CM | POA: Diagnosis not present

## 2018-10-31 ENCOUNTER — Other Ambulatory Visit: Payer: Self-pay | Admitting: Pulmonary Disease

## 2018-11-04 ENCOUNTER — Ambulatory Visit (INDEPENDENT_AMBULATORY_CARE_PROVIDER_SITE_OTHER): Payer: Medicare Other | Admitting: Pharmacist

## 2018-11-04 DIAGNOSIS — I480 Paroxysmal atrial fibrillation: Secondary | ICD-10-CM | POA: Diagnosis not present

## 2018-11-04 DIAGNOSIS — Z5181 Encounter for therapeutic drug level monitoring: Secondary | ICD-10-CM | POA: Diagnosis not present

## 2018-11-04 LAB — POCT INR: INR: 3.8 — AB (ref 2.0–3.0)

## 2018-11-09 DIAGNOSIS — J069 Acute upper respiratory infection, unspecified: Secondary | ICD-10-CM | POA: Diagnosis not present

## 2018-11-11 ENCOUNTER — Ambulatory Visit: Payer: Medicare Other | Admitting: Pulmonary Disease

## 2018-11-11 ENCOUNTER — Ambulatory Visit (INDEPENDENT_AMBULATORY_CARE_PROVIDER_SITE_OTHER): Payer: Medicare Other | Admitting: Pharmacist Clinician (PhC)/ Clinical Pharmacy Specialist

## 2018-11-11 DIAGNOSIS — I480 Paroxysmal atrial fibrillation: Secondary | ICD-10-CM | POA: Diagnosis not present

## 2018-11-11 DIAGNOSIS — Z5181 Encounter for therapeutic drug level monitoring: Secondary | ICD-10-CM | POA: Diagnosis not present

## 2018-11-11 LAB — POCT INR: INR: 2.4 (ref 2.0–3.0)

## 2018-11-11 NOTE — Patient Instructions (Signed)
Description   Continue with 1 tablet daily except 1/2 tablet each Sunday. Repeat INR in 1 week for possible Tikosyn admission.

## 2018-11-18 DIAGNOSIS — M3500 Sicca syndrome, unspecified: Secondary | ICD-10-CM | POA: Diagnosis not present

## 2018-11-18 DIAGNOSIS — M810 Age-related osteoporosis without current pathological fracture: Secondary | ICD-10-CM | POA: Diagnosis not present

## 2018-11-18 NOTE — Addendum Note (Signed)
Encounter addended by: Sherran Needs, NP on: 11/18/2018 2:41 PM  Actions taken: Clinical Note Signed

## 2018-11-21 ENCOUNTER — Other Ambulatory Visit: Payer: Self-pay | Admitting: Surgery

## 2018-11-21 ENCOUNTER — Encounter: Payer: Self-pay | Admitting: Cardiovascular Disease

## 2018-11-21 ENCOUNTER — Ambulatory Visit (INDEPENDENT_AMBULATORY_CARE_PROVIDER_SITE_OTHER): Payer: Medicare Other | Admitting: Cardiovascular Disease

## 2018-11-21 ENCOUNTER — Ambulatory Visit (INDEPENDENT_AMBULATORY_CARE_PROVIDER_SITE_OTHER): Payer: Medicare Other | Admitting: Pharmacist

## 2018-11-21 VITALS — BP 168/82 | HR 49 | Ht 63.0 in | Wt 149.0 lb

## 2018-11-21 DIAGNOSIS — J432 Centrilobular emphysema: Secondary | ICD-10-CM | POA: Diagnosis not present

## 2018-11-21 DIAGNOSIS — G4733 Obstructive sleep apnea (adult) (pediatric): Secondary | ICD-10-CM | POA: Diagnosis not present

## 2018-11-21 DIAGNOSIS — E785 Hyperlipidemia, unspecified: Secondary | ICD-10-CM

## 2018-11-21 DIAGNOSIS — R0609 Other forms of dyspnea: Secondary | ICD-10-CM

## 2018-11-21 DIAGNOSIS — I2510 Atherosclerotic heart disease of native coronary artery without angina pectoris: Secondary | ICD-10-CM

## 2018-11-21 DIAGNOSIS — Z7901 Long term (current) use of anticoagulants: Secondary | ICD-10-CM

## 2018-11-21 DIAGNOSIS — I480 Paroxysmal atrial fibrillation: Secondary | ICD-10-CM

## 2018-11-21 DIAGNOSIS — C3432 Malignant neoplasm of lower lobe, left bronchus or lung: Secondary | ICD-10-CM | POA: Diagnosis not present

## 2018-11-21 DIAGNOSIS — I252 Old myocardial infarction: Secondary | ICD-10-CM

## 2018-11-21 DIAGNOSIS — C3490 Malignant neoplasm of unspecified part of unspecified bronchus or lung: Secondary | ICD-10-CM

## 2018-11-21 DIAGNOSIS — I739 Peripheral vascular disease, unspecified: Secondary | ICD-10-CM | POA: Diagnosis not present

## 2018-11-21 DIAGNOSIS — Z5181 Encounter for therapeutic drug level monitoring: Secondary | ICD-10-CM | POA: Diagnosis not present

## 2018-11-21 DIAGNOSIS — R0600 Dyspnea, unspecified: Secondary | ICD-10-CM

## 2018-11-21 LAB — POCT INR: INR: 3.1 — AB (ref 2.0–3.0)

## 2018-11-21 MED ORDER — LISINOPRIL 20 MG PO TABS: ORAL_TABLET | ORAL | 1 refills | Status: DC
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
## 2018-11-21 NOTE — Patient Instructions (Signed)
Medication Instructions:  The current medical regimen is effective;  continue present plan and medications.  If you need a refill on your cardiac medications before your next appointment, please call your pharmacy.   Testing/Procedures: Echocardiogram - Your physician has requested that you have an echocardiogram. Echocardiography is a painless test that uses sound waves to create images of your heart. It provides your doctor with information about the size and shape of your heart and how well your heart's chambers and valves are working. This procedure takes approximately one hour. There are no restrictions for this procedure. This will be performed at our [facility] location - [address], [address].   Follow-Up: At [facility], you and your health needs are our priority.  As part of our continuing mission to provide you with exceptional heart care, we have created designated Provider Care Teams.  These Care Teams include your primary Cardiologist (physician) and Advanced Practice Providers (APPs -  Physician Assistants and Nurse Practitioners) who all work together to provide you with the care you need, when you need it. You will need a follow up appointment in 4 months.  Please call our office 2 months in advance to schedule this appointment.  You may see Shelva Majestic, MD or one of the following Advanced Practice Providers on your designated Care Team: [place], [place] . Fabian Sharp, PA-C

## 2018-11-23 ENCOUNTER — Encounter: Payer: Self-pay | Admitting: Cardiovascular Disease

## 2018-11-26 IMAGING — CR DG CHEST 2V
2 series · 2 of 2 positions shown · non-contrast
Comparison: Radiographs April 20, 2017.

CLINICAL DATA: Shortness of breath after exertion.

EXAM:
CHEST  2 VIEW

[w chest pa]
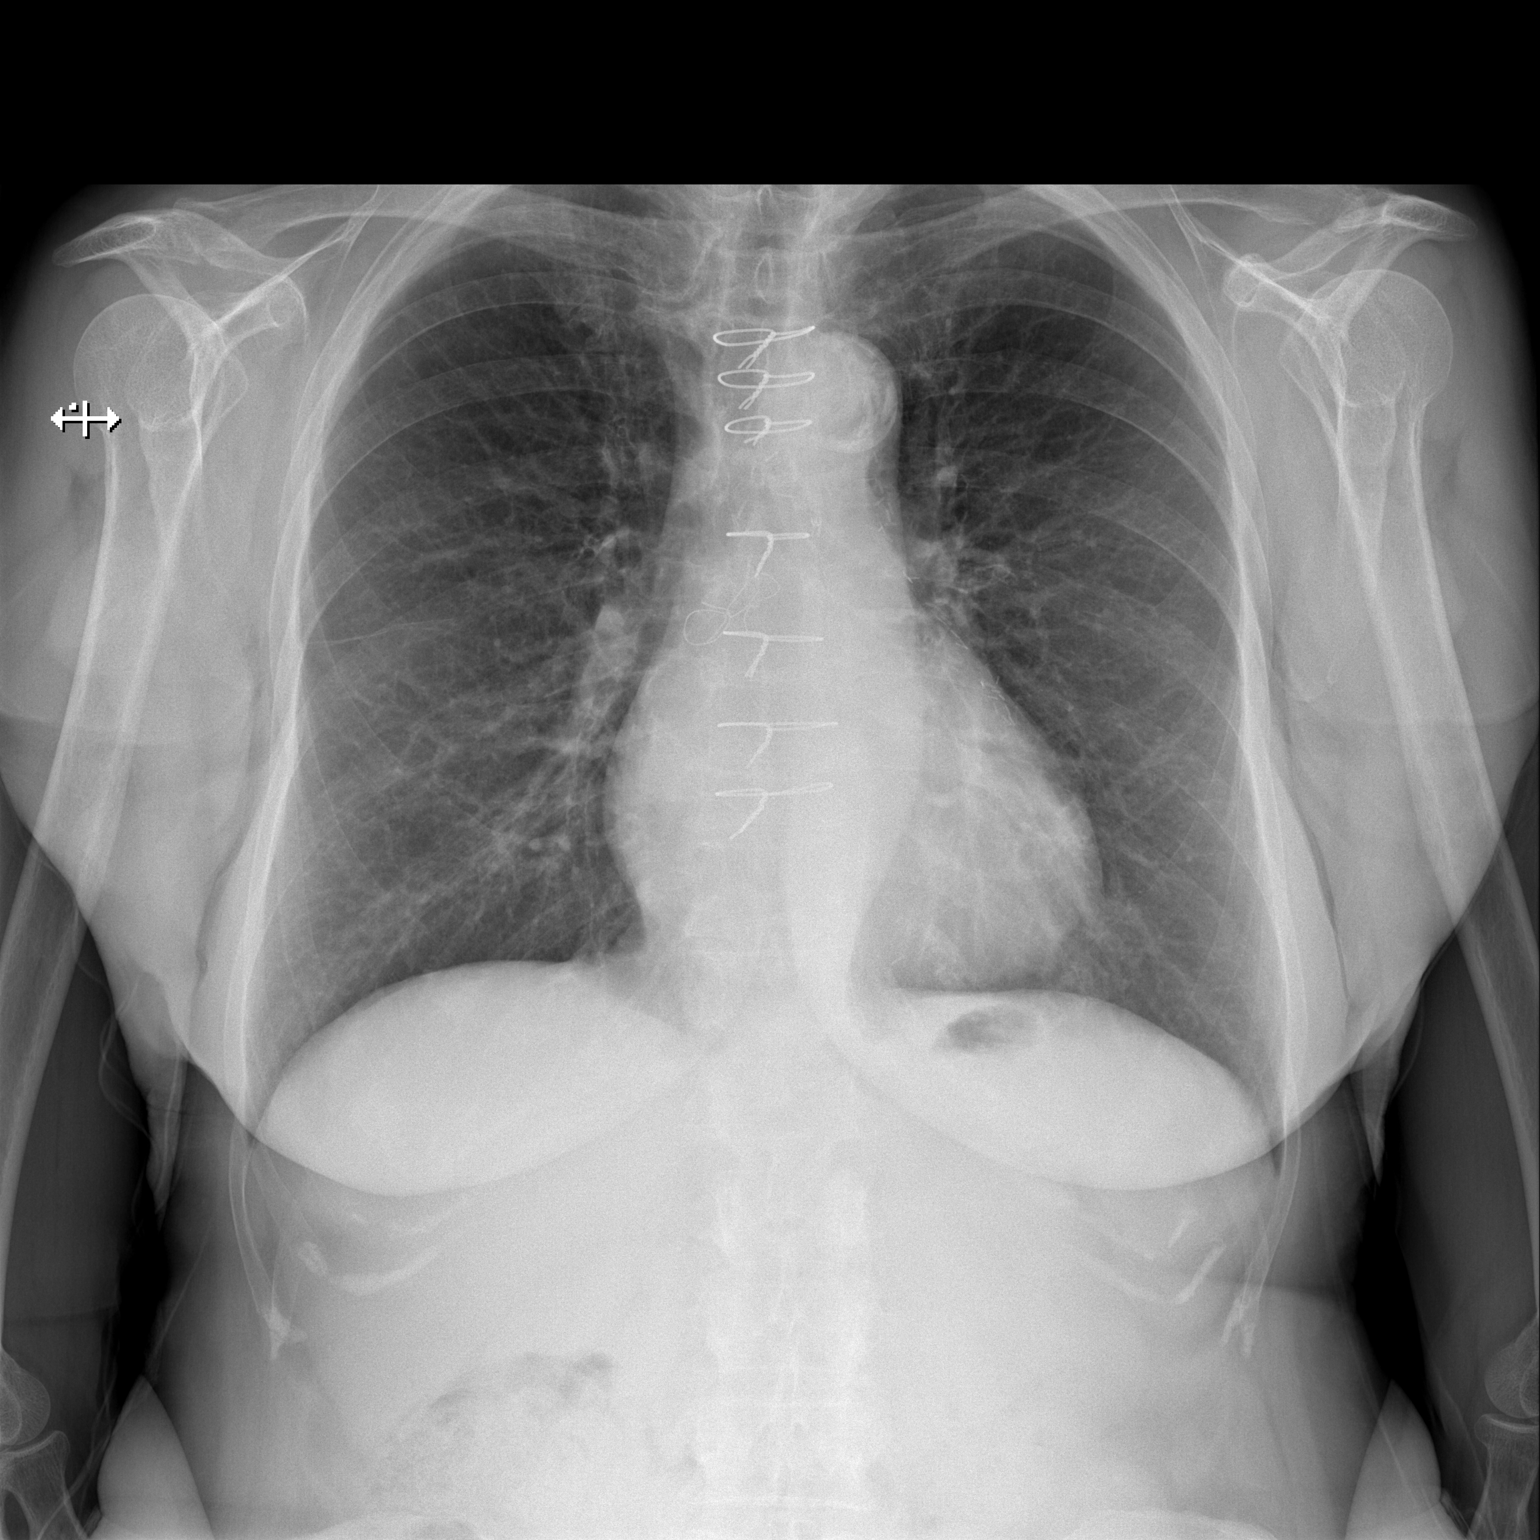

[w chest lat]
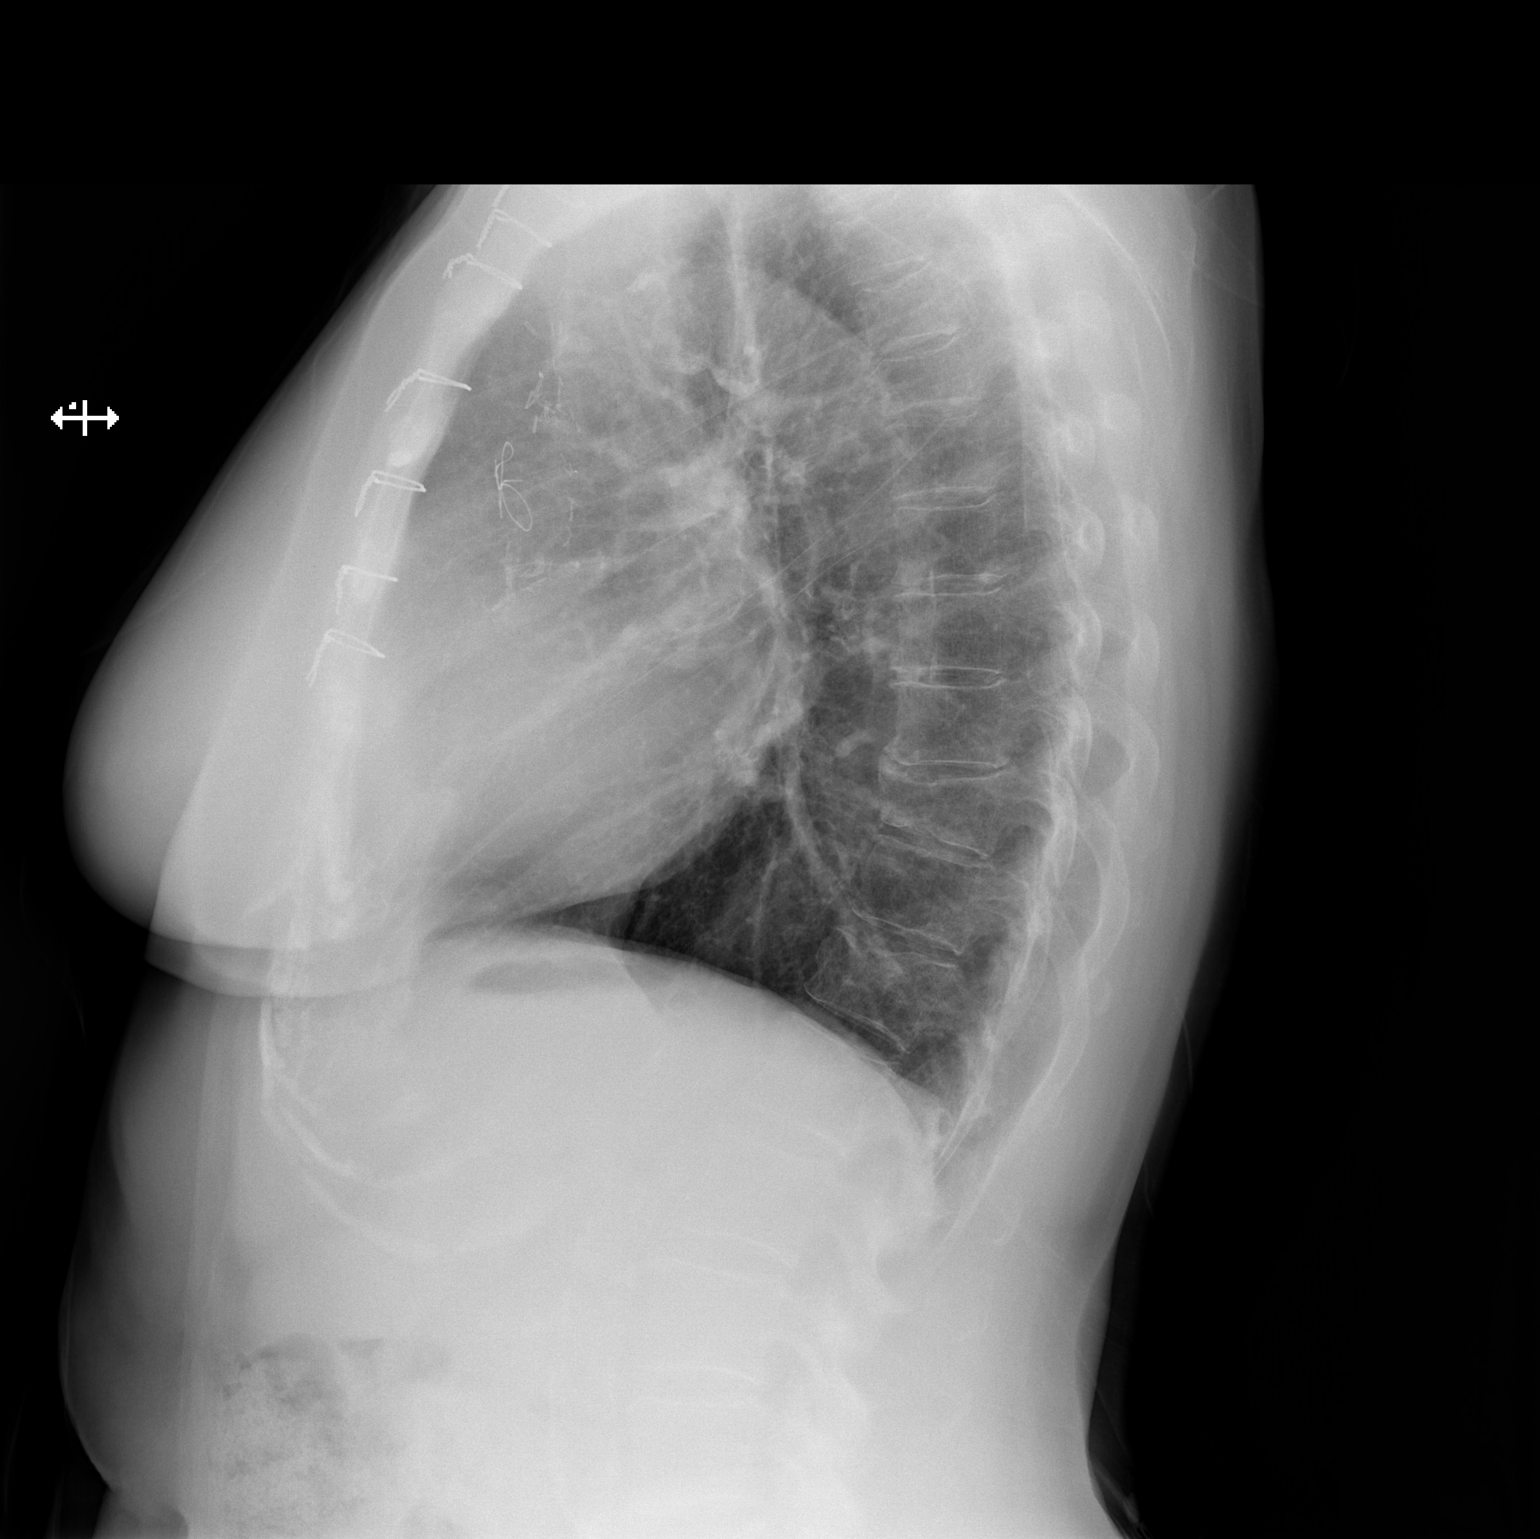

[2 of 2 positions shown; findings below may reference images not displayed]

FINDINGS: The heart size and mediastinal contours are within normal limits.
Both lungs are clear. Atherosclerosis of thoracic aorta is noted.
Sternotomy wires are noted. No pneumothorax or pleural effusion is
noted. The visualized skeletal structures are unremarkable.
IMPRESSION: No active cardiopulmonary disease.  Aortic atherosclerosis.

## 2018-11-27 ENCOUNTER — Ambulatory Visit (HOSPITAL_COMMUNITY): Payer: Medicare Other | Attending: Internal Medicine

## 2018-11-27 ENCOUNTER — Other Ambulatory Visit: Payer: Self-pay

## 2018-11-27 DIAGNOSIS — I480 Paroxysmal atrial fibrillation: Secondary | ICD-10-CM | POA: Insufficient documentation

## 2018-11-28 DIAGNOSIS — M8588 Other specified disorders of bone density and structure, other site: Secondary | ICD-10-CM | POA: Diagnosis not present

## 2018-11-29 ENCOUNTER — Telehealth: Payer: Self-pay | Admitting: Cardiovascular Disease

## 2018-11-29 DIAGNOSIS — I10 Essential (primary) hypertension: Secondary | ICD-10-CM

## 2018-11-29 IMAGING — DX DG CHEST 1V PORT
1 series · 1 of 1 positions shown · non-contrast
Comparison: PA and chest 06/29/2017.

CLINICAL DATA: Status post left lower lobectomy today.
Postoperative imaging.

EXAM:
PORTABLE CHEST 1 VIEW

[chest ap]
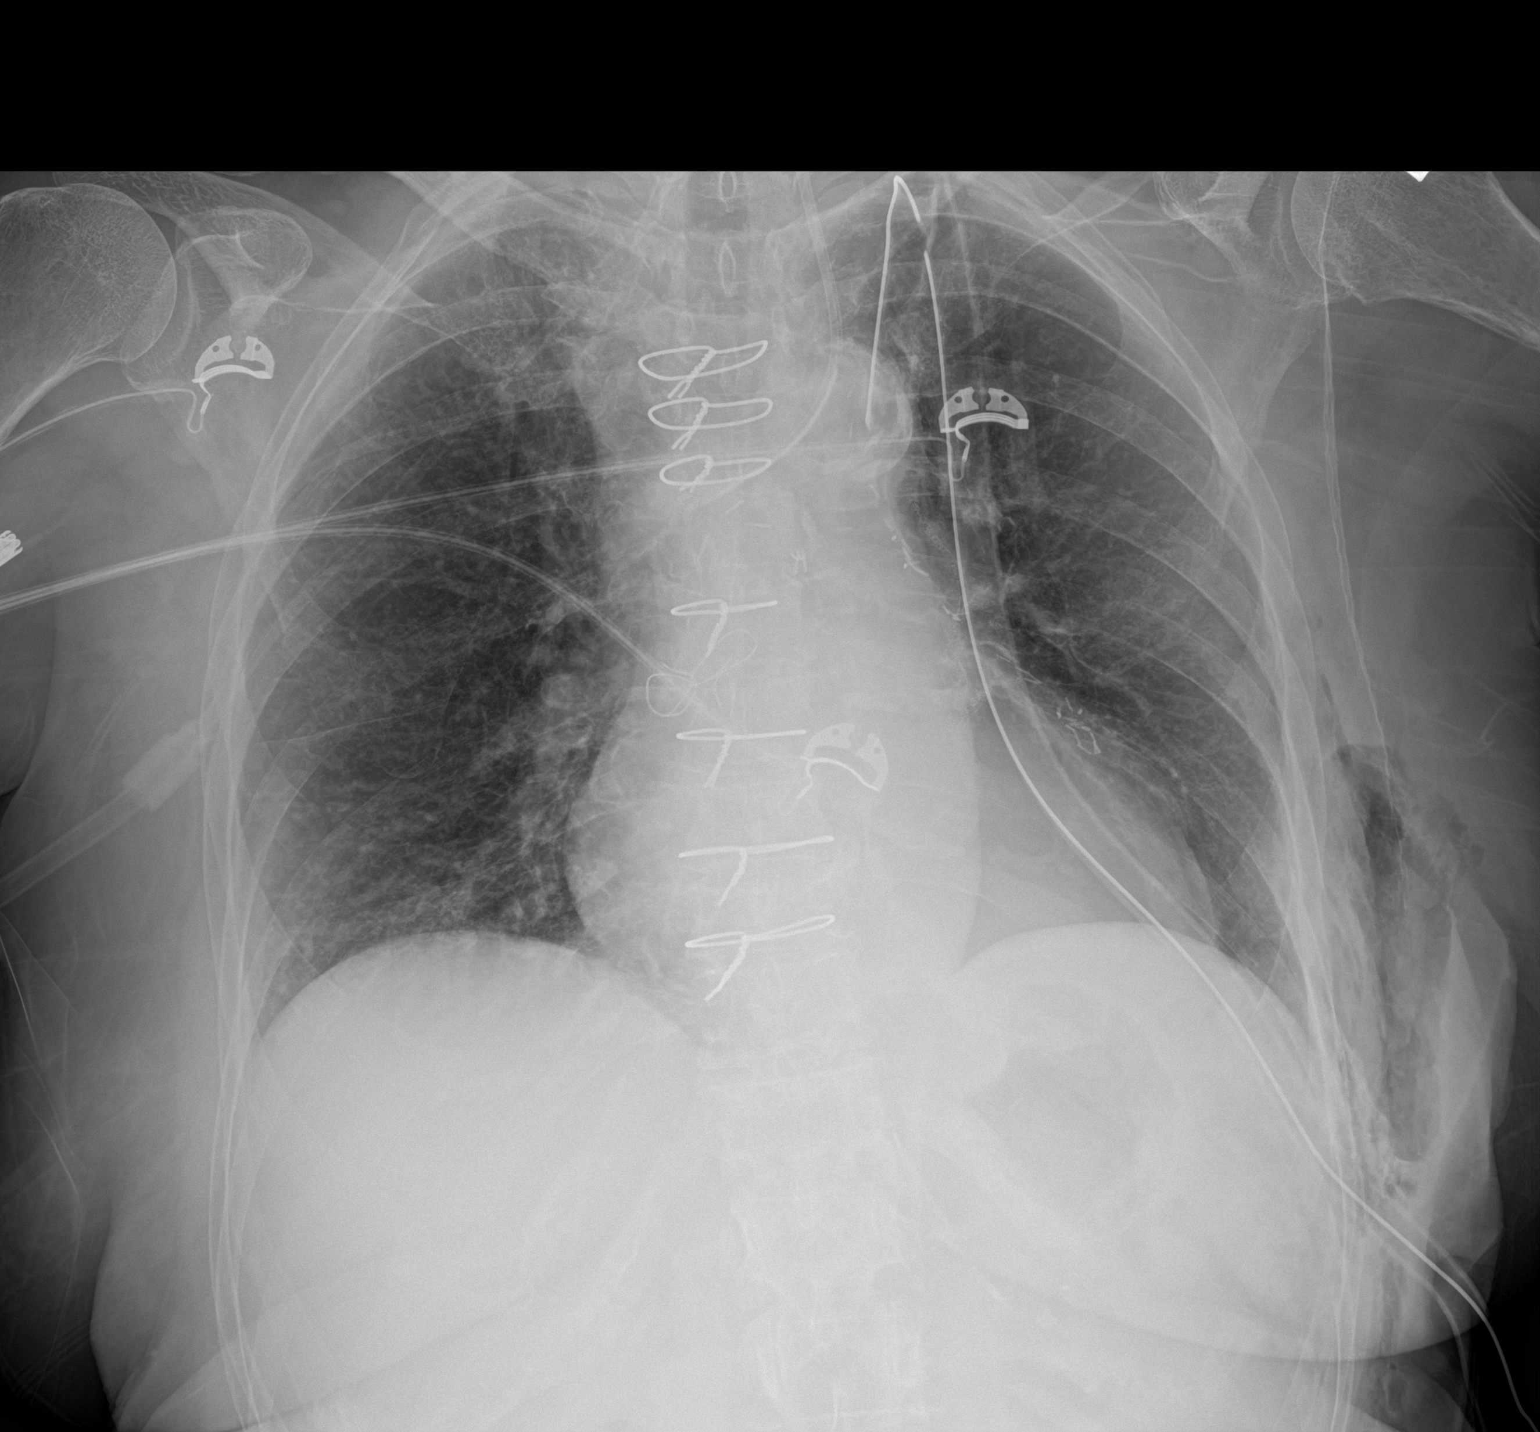

[1 of 1 positions shown; findings below may reference images not displayed]

FINDINGS: New left IJ catheter is in place with the tip at the brachiocephalic
confluence. The patient also has a new left chest tube. There is
subcutaneous air along the left chest wall. No pneumothorax. The
lungs are clear. Heart size is upper normal. The patient is status
post CABG.
IMPRESSION: Negative for pneumothorax with a left chest tube in place.

Left IJ catheter tip projects the brachiocephalic confluence.

## 2018-11-29 NOTE — Telephone Encounter (Signed)
Left message to call back  

## 2018-11-29 NOTE — Telephone Encounter (Signed)
Pt returning call. Informed pt of DOD Bridget Loveless, MD recommendation to start spironolactone 25 mg daily and come in for BMET on Monday 1/20. Pt states that she does not want to take any diuretic d/t her Hx of Sjogren's syndrome. Pt plans to present to in-office [facility] on 1/20 for BMET. Advised pt to continue to keep daily log of BP. Will route to Claiborne Billings, MD nurse since out of office until 1/28. Will also route to pharmD since pt has appt in coumadin clinic on 1/24

## 2018-11-29 NOTE — Telephone Encounter (Signed)
Recommend starting spironolactone 25 mg daily and BMET on Monday (forward results to triage or Dr. Evette Georges inbox coverage as I will also be away).  Please also call patient in 2 weeks and if blood pressures are still elevated >140/90, plan to increase dose to spironolactone 50 mg daily.

## 2018-11-29 NOTE — Telephone Encounter (Signed)
Patient is calling about questions to her blood pressure medication and lab work.

## 2018-11-29 NOTE — Telephone Encounter (Signed)
Patient seen 11/21/2018 and her blood pressure was elevated at that visit. 168/82. She is currently taking Toprol 25 mg twice a day and Lisinopril 20 mg twice a day. She checks her blood pressure most days and has been running high for the last couple of months. She had cataract surgery and Afib couple of months ago and that is when this started. Her systolic blood pressure has been running in the 130's-150's HR upper 50's to lower 60's with 2 readings in the 170's.  Today blood pressure 176/72 a couple of hours after her medications. She did take an extra Lisinopril 10 mg. She is concerned with her elevated blood pressure readings and feels these readings are higher than her goal of 120's. She is trying to get used to using CPAP which has been difficult for her. Will forward to Dr Margaretann Loveless DOD since Dr Claiborne Billings out of the office until 12/10/18

## 2018-11-30 IMAGING — DX DG CHEST 1V PORT
1 series · 1 of 1 positions shown · non-contrast
Comparison: 07/02/2017

CLINICAL DATA: Status post left lobectomy

EXAM:
PORTABLE CHEST 1 VIEW

[chest ap]
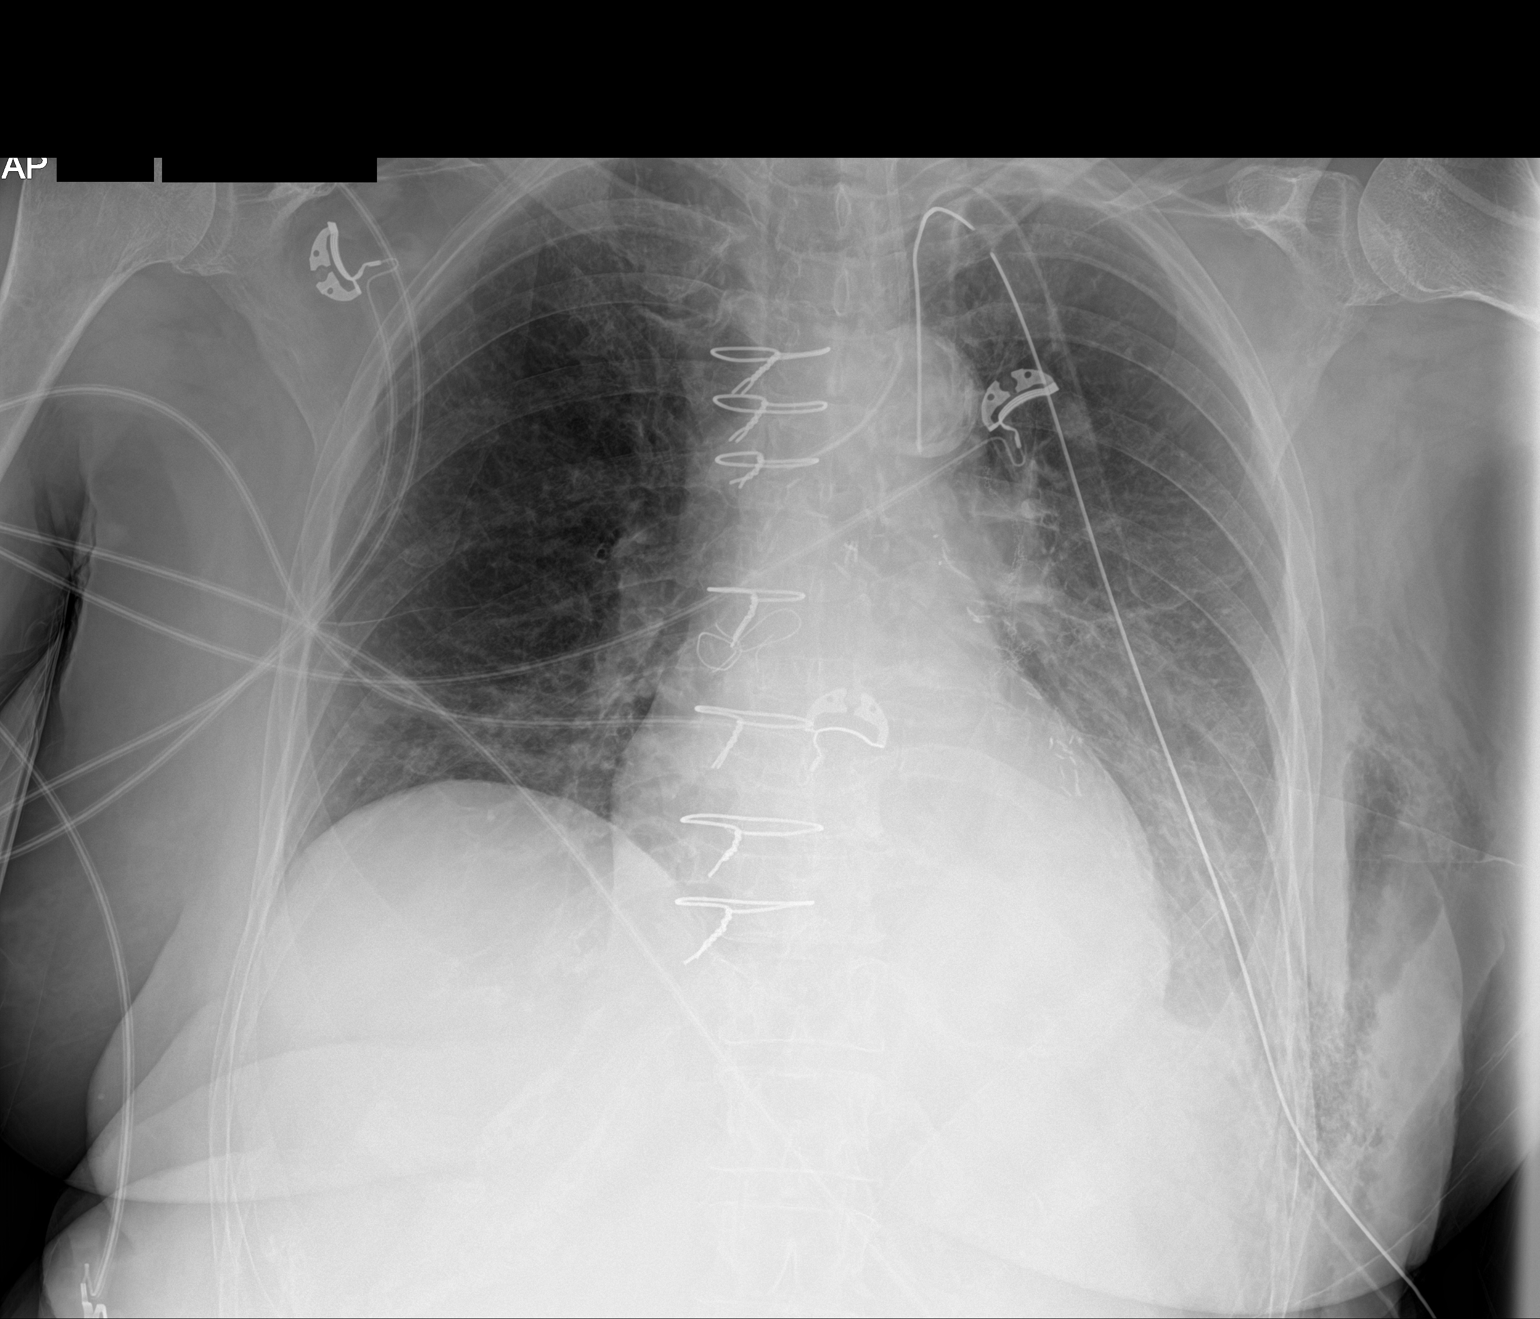

[1 of 1 positions shown; findings below may reference images not displayed]

FINDINGS: Cardiac shadow is stable. Postsurgical changes are again seen.
Left-sided thoracostomy tube and left jugular central line are again
seen and stable. Aortic calcifications are again noted. Lungs are
well aerated bilaterally. Persistent subcutaneous emphysema is noted
without evidence of pneumothorax. Some increased density is noted in
the left medial lung base which may represent early atelectasis.
IMPRESSION: Likely early atelectasis in the left lung base. No other focal
abnormality is noted.

## 2018-12-01 IMAGING — DX DG CHEST 1V PORT
1 series · 1 of 1 positions shown · non-contrast
Comparison: 07/04/2017

CLINICAL DATA: Chest pain, bleeding

EXAM:
PORTABLE CHEST 1 VIEW

[chest ap]
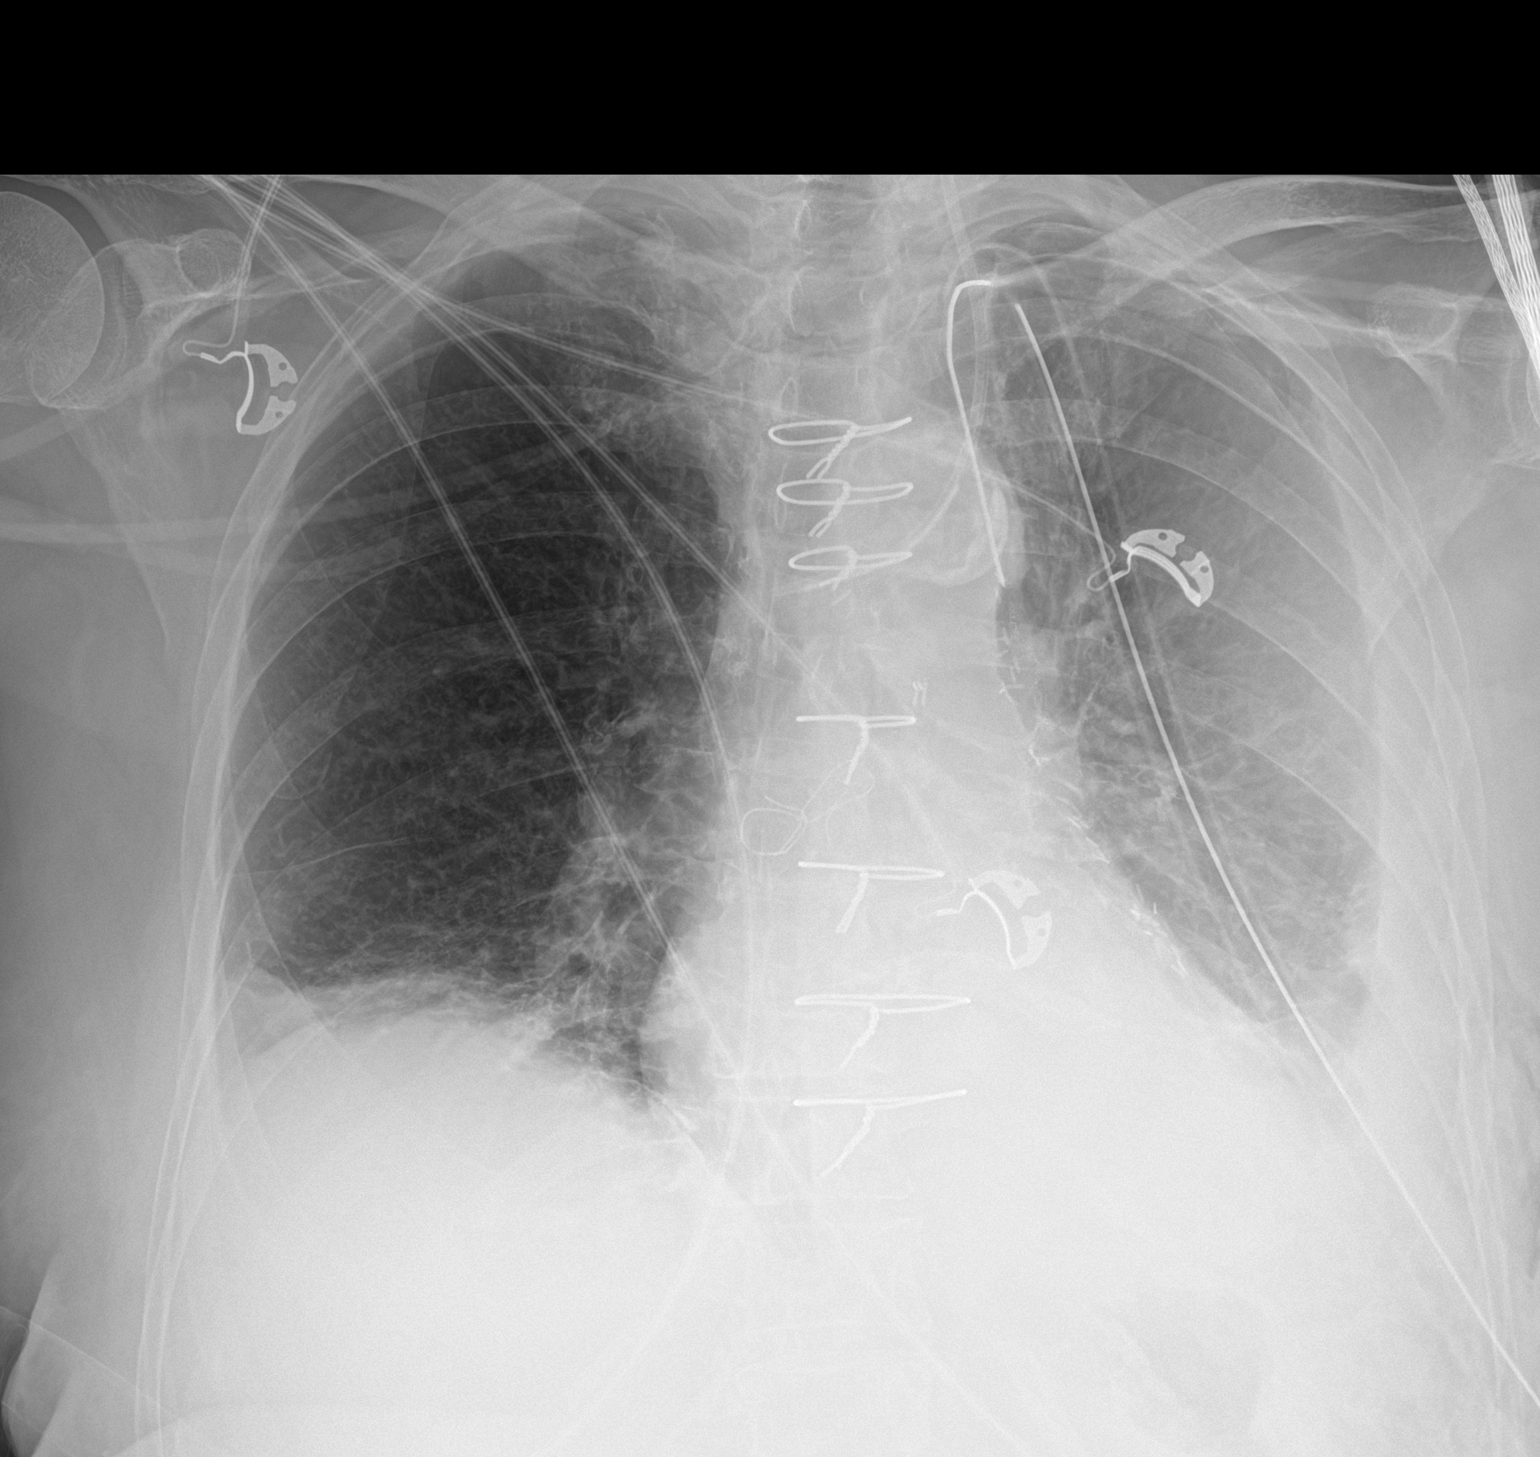

[1 of 1 positions shown; findings below may reference images not displayed]

FINDINGS: Left chest tube remains in place. Decreasing size of the scratched
at decreasing left pleural effusion, now moderate. There is a small
left apical and lateral pneumothorax, 5-10%. Bibasilar atelectasis
or infiltrates are stable. Prior CABG.
IMPRESSION: Left chest tube remains in place with decreasing left effusion but
small left pneumothorax not noted.

Bibasilar atelectasis or infiltrates.

## 2018-12-02 NOTE — Telephone Encounter (Signed)
LMTCB

## 2018-12-02 NOTE — Telephone Encounter (Signed)
Recommendation:  1. STOP spironolactone  2. Start amlodipine 2.5mg  daily (no allergy of intolerance to amlodipine noted).  3. Monitor BP twice daily and keep records.  4. Will check BP during warfarin visit and schedule pharmacist visit if needed.

## 2018-12-03 DIAGNOSIS — Z1231 Encounter for screening mammogram for malignant neoplasm of breast: Secondary | ICD-10-CM | POA: Diagnosis not present

## 2018-12-03 NOTE — Telephone Encounter (Signed)
Returned call to patient.She stated she is afraid to try a new medication.Stated she never took spironolactone.She does not want to take amlodipine.Stated her B/P is better.She will continue to monitor.She will bring a list of readings to appointment with pharmacy 12/06/18 at 8:30 am.

## 2018-12-04 ENCOUNTER — Other Ambulatory Visit: Payer: Self-pay | Admitting: Cardiovascular Disease

## 2018-12-06 ENCOUNTER — Ambulatory Visit (INDEPENDENT_AMBULATORY_CARE_PROVIDER_SITE_OTHER): Payer: Medicare Other | Admitting: *Deleted

## 2018-12-06 DIAGNOSIS — Z5181 Encounter for therapeutic drug level monitoring: Secondary | ICD-10-CM

## 2018-12-06 DIAGNOSIS — I480 Paroxysmal atrial fibrillation: Secondary | ICD-10-CM | POA: Diagnosis not present

## 2018-12-06 LAB — POCT INR: INR: 5 — AB (ref 2.0–3.0)

## 2018-12-06 NOTE — Patient Instructions (Signed)
Description   Skip today and tomorrow's dose, then change dose to 1 tablet daily except 1/2 tablet on Sundays and Thursdays. . Repeat INR in 7-10 days.

## 2018-12-11 ENCOUNTER — Encounter: Payer: Self-pay | Admitting: Internal Medicine

## 2018-12-11 ENCOUNTER — Ambulatory Visit (INDEPENDENT_AMBULATORY_CARE_PROVIDER_SITE_OTHER): Payer: Medicare Other | Admitting: Internal Medicine

## 2018-12-11 VITALS — BP 164/90 | HR 59 | Ht 62.0 in | Wt 148.6 lb

## 2018-12-11 DIAGNOSIS — I2510 Atherosclerotic heart disease of native coronary artery without angina pectoris: Secondary | ICD-10-CM | POA: Diagnosis not present

## 2018-12-11 DIAGNOSIS — I480 Paroxysmal atrial fibrillation: Secondary | ICD-10-CM | POA: Diagnosis not present

## 2018-12-11 DIAGNOSIS — I252 Old myocardial infarction: Secondary | ICD-10-CM | POA: Diagnosis not present

## 2018-12-11 DIAGNOSIS — I10 Essential (primary) hypertension: Secondary | ICD-10-CM | POA: Diagnosis not present
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
## 2018-12-11 NOTE — Patient Instructions (Addendum)
Medication Instructions:  Your physician recommends that you continue on your current medications as directed. Please refer to the Current Medication list given to you today.  Labwork: None ordered.  Testing/Procedures: None ordered.  Follow-Up: Your physician wants you to follow-up in: as needed with Dr. Allred.       Any Other Special Instructions Will Be Listed Below (If Applicable).  If you need a refill on your cardiac medications before your next appointment, please call your pharmacy.   

## 2018-12-12 ENCOUNTER — Telehealth: Payer: Self-pay | Admitting: Cardiovascular Disease

## 2018-12-12 NOTE — Telephone Encounter (Signed)
Okay to stop aspirin as long as her Coumadin is being maintained therapeutically.  Should reduce potential bleed risk

## 2018-12-12 NOTE — Telephone Encounter (Signed)
New message     Pt stated that she has questions about aspirin and INR. That's all she will tell me

## 2018-12-12 NOTE — Telephone Encounter (Signed)
Dr Rayann Heman suggest stopping ASA 81mg  daily, but patient will like DR [facility] input before making any changes. She has been taking ASA for 11 years.  She is also taking metoprolol 37.5mg  daily (25mg  in AM and 12.5mg  in PM).

## 2018-12-13 NOTE — Telephone Encounter (Signed)
Called patient, advised of MD note to stop aspirin. Patient verbalized understanding.

## 2018-12-16 ENCOUNTER — Telehealth: Payer: Self-pay | Admitting: Cardiovascular Disease

## 2018-12-16 MED ORDER — METOPROLOL SUCCINATE ER 25 MG PO TB24: ORAL_TABLET | ORAL | 3 refills | Status: DC

## 2018-12-16 NOTE — Telephone Encounter (Signed)
Message routed to Dr.Kelly's nurse Almyra Free, LPN

## 2018-12-16 NOTE — Telephone Encounter (Signed)
New Message         Pt c/o medication issue:  1. Name of Medication: Metoprolol Succinate 25 mg  2. How are you currently taking this medication (dosage and times per day)? 1 1/2  3. Are you having a reaction (difficulty breathing--STAT)? No   4. What is your medication issue? Patient is now taking 1 1/2 and she needs a new written Rx so that insurance will pay for it.       *STAT* If patient is at the pharmacy, call can be transferred to refill team.   1. Which medications need to be refilled? (please list name of each medication and dose if known) Metorprolol  2. Which pharmacy/location (including street and city if local pharmacy) is medication to be sent to?[facility] in [place] on [address]    3. Do they need a 30 day or 90 day supply? [place]

## 2018-12-16 NOTE — Telephone Encounter (Signed)
New Message         Patient is calling today to get a pre-auth Rx for "Lovaza" New drug plan new # [phone]

## 2018-12-16 NOTE — Telephone Encounter (Signed)
Called patient to notify that I will work on her PA for Lovaza.

## 2018-12-17 ENCOUNTER — Ambulatory Visit (INDEPENDENT_AMBULATORY_CARE_PROVIDER_SITE_OTHER): Payer: Medicare Other | Admitting: Pharmacist

## 2018-12-17 DIAGNOSIS — Z5181 Encounter for therapeutic drug level monitoring: Secondary | ICD-10-CM

## 2018-12-17 DIAGNOSIS — I480 Paroxysmal atrial fibrillation: Secondary | ICD-10-CM | POA: Diagnosis not present

## 2018-12-17 LAB — POCT INR: INR: 2.9 (ref 2.0–3.0)

## 2018-12-17 NOTE — Telephone Encounter (Signed)
Copy of new card available

## 2018-12-19 NOTE — Telephone Encounter (Signed)
Received new insurance card, submitted PA request for medication.  Awaiting approval.

## 2018-12-19 NOTE — Telephone Encounter (Signed)
PA was sent to plan via covermymeds, should receive a fax with response.

## 2018-12-24 ENCOUNTER — Ambulatory Visit (INDEPENDENT_AMBULATORY_CARE_PROVIDER_SITE_OTHER): Payer: Medicare Other | Admitting: Pulmonary Disease

## 2018-12-24 ENCOUNTER — Encounter: Payer: Self-pay | Admitting: Pulmonary Disease

## 2018-12-24 VITALS — BP 136/78 | HR 60 | Ht 62.0 in | Wt 152.0 lb

## 2018-12-24 DIAGNOSIS — Z9989 Dependence on other enabling machines and devices: Secondary | ICD-10-CM

## 2018-12-24 DIAGNOSIS — G4733 Obstructive sleep apnea (adult) (pediatric): Secondary | ICD-10-CM | POA: Diagnosis not present

## 2018-12-24 NOTE — Patient Instructions (Signed)
Obstructive sleep apnea with intolerance to CPAP at present  As we discussed, give the CPAP another try If still not tolerated, we will refer you to a dentist for evaluation for an oral device for the treatment of sleep apnea  I will see you back in the office in about 6 weeks   Living With Sleep Apnea Sleep apnea is a condition in which breathing pauses or becomes shallow during sleep. Sleep apnea is most commonly caused by a collapsed or blocked airway. People with sleep apnea snore loudly and have times when they gasp and stop breathing for 10 seconds or more during sleep. This happens over and over during the night. This disrupts your sleep and keeps your body from getting the rest that it needs, which can cause tiredness and lack of energy (fatigue) during the day. The breaks in breathing also interrupt the deep sleep that you need to feel rested. Even if you do not completely wake up from the gaps in breathing, your sleep may not be restful. You may also have a headache in the morning and low energy during the day, and you may feel anxious or depressed. How can sleep apnea affect me? Sleep apnea increases your chances of extreme tiredness during the day (daytime fatigue). It can also increase your risk for health conditions, such as:  Heart attack.  Stroke.  Diabetes.  Heart failure.  Irregular heartbeat.  High blood pressure. If you have daytime fatigue as a result of sleep apnea, you may be more likely to:  Perform poorly at school or work.  Fall asleep while driving.  Have difficulty with attention.  Develop depression or anxiety.  Become severely overweight (obese).  Have sexual dysfunction. What actions can I take to manage sleep apnea? Sleep apnea treatment   If you were given a device to open your airway while you sleep, use it only as told by your health care provider. You may be given: ? An oral appliance. This is a custom-made mouthpiece that shifts your  lower jaw forward. ? A continuous positive airway pressure (CPAP) device. This device blows air through a mask when you breathe out (exhale). ? A nasal expiratory positive airway pressure (EPAP) device. This device has valves that you put into each nostril. ? A bi-level positive airway pressure (BPAP) device. This device blows air through a mask when you breathe in (inhale) and breathe out (exhale).  You may need surgery if other treatments do not work for you. Sleep habits  Go to sleep and wake up at the same time every day. This helps set your internal clock (circadian rhythm) for sleeping. ? If you stay up later than usual, such as on weekends, try to get up in the morning within 2 hours of your normal wake time.  Try to get at least 7-9 hours of sleep each night.  Stop computer, tablet, and mobile phone use a few hours before bedtime.  Do not take long naps during the day. If you nap, limit it to 30 minutes.  Have a relaxing bedtime routine. Reading or listening to music may relax you and help you sleep.  Use your bedroom only for sleep. ? Keep your television and computer out of your bedroom. ? Keep your bedroom cool, dark, and quiet. ? Use a supportive mattress and pillows.  Follow your health care provider's instructions for other changes to sleep habits. Nutrition  Do not eat heavy meals in the evening.  Do not have caffeine in  the later part of the day. The effects of caffeine can last for more than 5 hours.  Follow your health care provider's or dietitian's instructions for any diet changes. Lifestyle      Do not drink alcohol before bedtime. Alcohol can cause you to fall asleep at first, but then it can cause you to wake up in the middle of the night and have trouble getting back to sleep.  Do not use any products that contain nicotine or tobacco, such as cigarettes and e-cigarettes. If you need help quitting, ask your health care provider. Medicines  Take  over-the-counter and prescription medicines only as told by your health care provider.  Do not use over-the-counter sleep medicine. You can become dependent on this medicine, and it can make sleep apnea worse.  Do not use medicines, such as sedatives and narcotics, unless told by your health care provider. Activity  Exercise on most days, but avoid exercising in the evening. Exercising near bedtime can interfere with sleeping.  If possible, spend time outside every day. Natural light helps regulate your circadian rhythm. General information  Lose weight if you need to, and maintain a healthy weight.  Keep all follow-up visits as told by your health care provider. This is important.  If you are having surgery, make sure to tell your health care provider that you have sleep apnea. You may need to bring your device with you. Where to find more information Learn more about sleep apnea and daytime fatigue from:  American Sleep Association: sleepassociation.[place]: sleepfoundation.org  National Heart, Lung, and Blood Institute: https://www.hartman-hill.biz/ Summary  Sleep apnea can cause daytime fatigue and other serious health conditions.  Both sleep apnea and daytime fatigue can be bad for your health and well-being.  You may need to wear a device while sleeping to help keep your airway open.  If you are having surgery, make sure to tell your health care provider that you have sleep apnea. You may need to bring your device with you.  Making changes to sleep habits, diet, lifestyle, and activity can help you manage sleep apnea. This information is not intended to replace advice given to you by your health care provider. Make sure you discuss any questions you have with your health care provider. Document Released: 01/24/2018 Document Revised: 07/02/2018 Document Reviewed: 01/24/2018 Elsevier Interactive Patient Education  [employer].
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
## 2018-12-25 ENCOUNTER — Encounter: Payer: Self-pay | Admitting: Surgery

## 2018-12-25 ENCOUNTER — Other Ambulatory Visit: Payer: Self-pay

## 2018-12-25 ENCOUNTER — Ambulatory Visit
Admission: RE | Admit: 2018-12-25 | Discharge: 2018-12-25 | Disposition: A | Discharge status: Home / Self Care | Payer: Medicare Other | Source: Ambulatory Visit | Attending: Surgery | Admitting: Surgery

## 2018-12-25 ENCOUNTER — Ambulatory Visit (INDEPENDENT_AMBULATORY_CARE_PROVIDER_SITE_OTHER): Payer: Medicare Other | Admitting: Surgery

## 2018-12-25 VITALS — BP 150/72 | HR 58 | Resp 18 | Ht 62.0 in | Wt 150.2 lb

## 2018-12-25 DIAGNOSIS — Z85118 Personal history of other malignant neoplasm of bronchus and lung: Secondary | ICD-10-CM | POA: Diagnosis not present

## 2018-12-25 DIAGNOSIS — I252 Old myocardial infarction: Secondary | ICD-10-CM | POA: Diagnosis not present

## 2018-12-25 DIAGNOSIS — I2510 Atherosclerotic heart disease of native coronary artery without angina pectoris: Secondary | ICD-10-CM

## 2018-12-25 DIAGNOSIS — J90 Pleural effusion, not elsewhere classified: Secondary | ICD-10-CM | POA: Diagnosis not present

## 2018-12-25 DIAGNOSIS — C3490 Malignant neoplasm of unspecified part of unspecified bronchus or lung: Secondary | ICD-10-CM

## 2018-12-25 DIAGNOSIS — Z902 Acquired absence of lung [part of]: Secondary | ICD-10-CM | POA: Diagnosis not present

## 2018-12-26 ENCOUNTER — Encounter: Payer: Self-pay | Admitting: Surgery

## 2018-12-26 ENCOUNTER — Telehealth: Payer: Self-pay | Admitting: Cardiovascular Disease

## 2018-12-26 NOTE — Telephone Encounter (Signed)
If her insurance will pay, change [place] to the [facility]

## 2018-12-26 NOTE — Telephone Encounter (Signed)
New Message:     May would like to know which alternate can patient use in the place of her Omega 3 Acid? The choices are  Fenofibrate, Niacin ER, and Vascepa. Which one Dr Claiborne Billings decdes, she will need a prescription sent in for it please.

## 2018-12-26 NOTE — Progress Notes (Signed)
HPI:  The patient returns for routine surveillance follow-up having undergone left muscle-sparing thoracotomy and left lower lobectomy on 07/03/2007 for a T1, N0, N0 squamous cell carcinoma of the lung.  She had lymphovascular invasion but negative resection margins.  Her postoperative recovery was slow due to moderate COPD.  She improved with pulmonary rehab and continues to be followed by Dr. Lake Bells.  She has had continued problems with paroxysmal atrial fibrillation and is followed in the atrial fibrillation clinic.  She denies any headache or visual changes.  She has had some back pain and underwent an MRI of the spine in December 2019 showing multiple remote and healed endplate/compression fractures and some noncompressive disc protrusions.  There was no evidence of metastatic bone disease.  She has been eating and maintaining her weight.  She denies any cough or hemoptysis.  Current Outpatient Medications  Medication Sig Dispense Refill  . acetaminophen (TYLENOL) 500 MG tablet Take 500 mg by mouth every 8 (eight) hours as needed for mild pain or moderate pain.    . Calcium Carb-Cholecalciferol (CALTRATE 600+D) 600-800 MG-UNIT TABS Take 1 tablet by mouth daily.     . Cholecalciferol (VITAMIN D-3) 5000 UNITS TABS Take 5,000 Units by mouth daily.     [name] ezetimibe (ZETIA) 10 MG tablet TAKE 1 TABLET(10 MG) BY MOUTH EVERY EVENING 90 tablet 3  . hydrocortisone valerate cream (WESTCORT) 0.2 % Apply 1 application topically 3 (three) times a week. On random days - for eczema in ear    . INCRUSE ELLIPTA 62.5 MCG/INH AEPB INHALE 1 PUFF INTO THE LUNGS DAILY 30 each 11  . isosorbide mononitrate (IMDUR) 30 MG 24 hr tablet Take 1 tablet (30 mg total) by mouth 2 (two) times daily. 180 tablet 1  . lisinopril (PRINIVIL,ZESTRIL) 20 MG tablet TAKE 1 TABLET(20 MG) BY MOUTH TWICE DAILY 180 tablet 1  . metoprolol succinate (TOPROL-XL) 25 MG 24 hr tablet TAKE 1.5 TABLET BY MOUTH DAILY 90 tablet 3  . metoprolol  tartrate (LOPRESSOR) 25 MG tablet Take 1 tablet (25 mg total) by mouth daily as needed. 30 tablet 12  . nitroGLYCERIN (NITROSTAT) 0.4 MG SL tablet Place 1 tablet (0.4 mg total) under the tongue every 5 (five) minutes x 3 doses as needed for chest pain. 25 tablet 1  . NON FORMULARY Place 1 drop into both eyes 4 (four) times daily.    [name] omega-3 acid ethyl esters (LOVAZA) 1 g capsule TAKE 1 CAPSULE BY MOUTH EVERY DAY AT NOON 30 capsule 11  . pyridOXINE (VITAMIN B-6) 100 MG tablet Take 100 mg by mouth daily.    . valACYclovir (VALTREX) 500 MG tablet Take 500 mg by mouth daily as needed. For cold sores    . warfarin (COUMADIN) 2.5 MG tablet TAKE 1 TO 1 AND 1/2 TABLETS BY MOUTH DAILY AS DIRECTED BY COUMADIN CLINIC 135 tablet 0  . rosuvastatin (CRESTOR) 10 MG tablet Take 1 tablet (10 mg total) by mouth daily. 90 tablet 3   No current facility-administered medications for this visit.      Physical Exam: BP (!) 150/72 (BP Location: Left Arm, Patient Position: Sitting, Cuff Size: Normal)   Pulse (!) 58   Resp 18   Ht 5\' 2"  (1.575 m)   Wt 150 lb 3.2 oz (68.1 kg)   SpO2 97% Comment: RA  BMI 27.47 kg/m  She looks well. There is no cervical or supraclavicular adenopathy. Cardiac exam shows a regular rate and rhythm with normal heart  sounds. Lung exam is clear. The left thoracotomy scar is well-healed and there are no skin lesions.  Diagnostic Tests:  CLINICAL DATA:  Prior left lower lobectomy on 07/02/2017 for lung cancer. Colon resection. Hernia repair.  EXAM: CT CHEST WITHOUT CONTRAST  TECHNIQUE: Multidetector CT imaging of the chest was performed following the standard protocol without IV contrast.  COMPARISON:  12/24/2017  FINDINGS: Cardiovascular: Advanced aortic and branch vessel atherosclerosis. Tortuous thoracic aorta. Normal heart size, without pericardial effusion. Median sternotomy for CABG.  Mediastinum/Nodes: No supraclavicular adenopathy. No mediastinal  or definite hilar adenopathy, given limitations of unenhanced CT.  Lungs/Pleura: Small left pleural effusion is similar.  Moderate bullous type emphysema.  Left lower lobectomy. Subpleural 4 mm right upper lobe pulmonary nodule is unchanged on image 40/8.  Upper Abdomen: Segment 3 hepatic cyst. Normal imaged portions of the spleen, stomach, pancreas, gallbladder, adrenal glands, kidneys. Abdominal aortic and branch vessel atherosclerosis.  Musculoskeletal: Multiple superior endplate irregularities throughout the thoracic spine are similar. No ventral canal encroachment.  IMPRESSION: 1. Status post left lower lobectomy, without recurrent or metastatic disease. 2. Similar small left pleural effusion.  Aortic Atherosclerosis (ICD10-I70.0) and Emphysema (ICD10-J43.9).   Electronically Signed   By: Abigail Miyamoto M.D.   On: 12/25/2018 14:32   Impression:  She is doing well 1/2 years following lung cancer resection with no evidence of recurrent or metastatic disease on her chest CT.  I reviewed the CT images with her and her daughter answered their questions.  Plan:  I will plan to see her back in 6 months with a CT scan of the chest.  . I spent 15 minutes performing this established patient evaluation and > 50% of this time was spent face to face counseling and coordinating the surveillance of this patient's prior lung cancer.   Gaye Pollack, MD [facility] [phone]

## 2018-12-28 DIAGNOSIS — K449 Diaphragmatic hernia without obstruction or gangrene: Secondary | ICD-10-CM | POA: Insufficient documentation

## 2018-12-31 MED ORDER — ICOSAPENT ETHYL 1 G PO CAPS: 1.0000 g | ORAL_CAPSULE | Freq: Two times a day (BID) | ORAL | 2 refills | Status: DC

## 2018-12-31 NOTE — Telephone Encounter (Signed)
Received notification that patient needed to switch, okay per MD to switch to Vascepa.  Patient was notified.

## 2018-12-31 NOTE — Telephone Encounter (Signed)
Vascepa should be 1 gm twice daily

## 2018-12-31 NOTE — Telephone Encounter (Signed)
Switched patient to medication and dose.  Sent to pharmacy

## 2018-12-31 NOTE — Telephone Encounter (Signed)
Called and notified patient.

## 2018-12-31 NOTE — Addendum Note (Signed)
Addended by: Caprice Beaver T on: 12/31/2018 11:07 AM   Modules accepted: Orders

## 2018-12-31 NOTE — Telephone Encounter (Signed)
Can you please advise on the dose of this?? Thank you!

## 2019-01-01 ENCOUNTER — Ambulatory Visit (INDEPENDENT_AMBULATORY_CARE_PROVIDER_SITE_OTHER): Payer: Medicare Other | Admitting: Pharmacist

## 2019-01-01 DIAGNOSIS — I480 Paroxysmal atrial fibrillation: Secondary | ICD-10-CM | POA: Diagnosis not present

## 2019-01-01 DIAGNOSIS — Z5181 Encounter for therapeutic drug level monitoring: Secondary | ICD-10-CM | POA: Diagnosis not present

## 2019-01-01 DIAGNOSIS — R7309 Other abnormal glucose: Secondary | ICD-10-CM | POA: Diagnosis not present

## 2019-01-01 LAB — POCT INR: INR: 2.2 (ref 2.0–3.0)

## 2019-01-06 ENCOUNTER — Other Ambulatory Visit: Payer: Self-pay | Admitting: Pulmonary Disease

## 2019-01-08 ENCOUNTER — Encounter: Payer: Self-pay | Admitting: Internal Medicine

## 2019-01-08 ENCOUNTER — Telehealth: Payer: Self-pay

## 2019-01-08 ENCOUNTER — Ambulatory Visit (INDEPENDENT_AMBULATORY_CARE_PROVIDER_SITE_OTHER): Payer: Medicare Other | Admitting: Internal Medicine

## 2019-01-08 VITALS — BP 120/62 | HR 60 | Ht 62.0 in | Wt 150.0 lb

## 2019-01-08 DIAGNOSIS — J449 Chronic obstructive pulmonary disease, unspecified: Secondary | ICD-10-CM

## 2019-01-08 DIAGNOSIS — C3432 Malignant neoplasm of lower lobe, left bronchus or lung: Secondary | ICD-10-CM

## 2019-01-08 DIAGNOSIS — I252 Old myocardial infarction: Secondary | ICD-10-CM

## 2019-01-08 DIAGNOSIS — I480 Paroxysmal atrial fibrillation: Secondary | ICD-10-CM | POA: Diagnosis not present

## 2019-01-08 DIAGNOSIS — I4892 Unspecified atrial flutter: Secondary | ICD-10-CM

## 2019-01-08 DIAGNOSIS — I2510 Atherosclerotic heart disease of native coronary artery without angina pectoris: Secondary | ICD-10-CM

## 2019-01-08 NOTE — Telephone Encounter (Signed)
Pt was returning a call to the office stating that they weren't started on the praluent due to the unaffordable cost. I advised the pt that we have several ways to get them help and they stated that they woud like to try it. They also mentioned that they have had an insurance card change. So I will get the epa for praluent 75mg  started with that new insurance and see what happens

## 2019-01-08 NOTE — Telephone Encounter (Signed)
Called pt left msg regarding the expiration of the praluent 75 mg PA and needing labs for renewal. Instructed the pt to call us back and let us know how she is handling the med and get a lipid and hepatic panel scheduled

## 2019-01-08 NOTE — Patient Instructions (Signed)
Medication Instructions:  Your physician recommends that you continue on your current medications as directed. Please refer to the Current Medication list given to you today.  Labwork: None ordered.  Testing/Procedures: None ordered.  Follow-Up: Your physician recommends that you schedule a follow-up appointment in:   6 months with Dr. Klein  Any Other Special Instructions Will Be Listed Below (If Applicable).     If you need a refill on your cardiac medications before your next appointment, please call your pharmacy.  
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
## 2019-01-14 ENCOUNTER — Other Ambulatory Visit: Payer: Self-pay

## 2019-01-14 MED ORDER — ALIROCUMAB 75 MG/ML ~~LOC~~ SOAJ: 75.0000 mg | SUBCUTANEOUS | 6 refills | Status: DC

## 2019-01-14 NOTE — Telephone Encounter (Signed)
Called and spoke with pt regarding the praluent med approved for 75mg 

## 2019-01-16 ENCOUNTER — Other Ambulatory Visit: Payer: Self-pay | Admitting: Nurse Practitioner

## 2019-01-16 DIAGNOSIS — M810 Age-related osteoporosis without current pathological fracture: Secondary | ICD-10-CM | POA: Diagnosis not present

## 2019-01-23 DIAGNOSIS — M810 Age-related osteoporosis without current pathological fracture: Secondary | ICD-10-CM | POA: Diagnosis not present

## 2019-01-23 DIAGNOSIS — M791 Myalgia, unspecified site: Secondary | ICD-10-CM | POA: Diagnosis not present

## 2019-01-28 ENCOUNTER — Telehealth: Payer: Self-pay | Admitting: Cardiovascular Disease

## 2019-01-28 NOTE — Telephone Encounter (Signed)
Pt called stating that her insurance will not cover Lovaza and requesting if medication can be changed to something different. Advised pt that per phone conversation on 2/18 Dr. Claiborne Billings stated it was ok to switch from Lovaza to [place]. Pt states Vascepa was never started and on 2/26 it was taking of med list by Dr. Olin Pia office. Will route to MD to advise if he would like pt to continue with plan to switch to Vascepa.

## 2019-01-28 NOTE — Telephone Encounter (Signed)
Patient states her insurance won't cover the Omega 3. She would like for a nurse to call her.

## 2019-01-29 MED ORDER — ICOSAPENT ETHYL 1 G PO CAPS: 1.0000 | ORAL_CAPSULE | Freq: Two times a day (BID) | ORAL | 1 refills | Status: DC

## 2019-01-29 NOTE — Telephone Encounter (Signed)
Please proceed with Vascepa if covered by insurance.  Vascepa is a superior product with proven cardiovascular protection.  Otherwise, we will need to change therapy to OTC omega-3 las a last resource.

## 2019-01-29 NOTE — Telephone Encounter (Signed)
Can you please advise, this was settled back on other messages, but patient never picked up medication. Still okay to continue with plan?  Thanks!

## 2019-01-29 NOTE — Telephone Encounter (Signed)
Submitted Vascepa to pharmacy.

## 2019-01-31 ENCOUNTER — Other Ambulatory Visit: Payer: Self-pay | Admitting: Cardiovascular Disease

## 2019-01-31 ENCOUNTER — Telehealth: Payer: Self-pay | Admitting: *Deleted

## 2019-01-31 NOTE — Telephone Encounter (Signed)
1. Have you recently travelled abroad or to [place], [place], or [place]? No 2. Do you currently have a fever? No 3. Have you been in contact with someone that is currently pending confirmation of COVID19 testing or has been confirmed to have the COVID19 virus? No 4. Are you currently experiencing fatigue or cough?No 5. Are you currently experiencing new or worsening shortness of breath at rest or with minimal activity? No 6. Have you been in contact with someone that was recently sick with fever/cough/fatigue? No   **A score of 4 or more should result in cancellation of the pts cardiology appt  **A score of 2 should be provided a mask prior to admission into the lobby  **TRAVEL to a high risk area or contact with a confirmed case should stay at home, away from confirmed patient, monitor symptoms, and reach out to PCP for evisit, additional testing.   **ALL PTS WITH FEVER SHOULD BE REFERRED TO PCP FOR EVISIT  Pt. Advised that we are restricting visitors at this time and request that only patients present for check-in prior to their appointment. All other visitors should remain in their car. If necessary, only one visitor may come with the patient into the building. For everyone's safety, all patients and visitors entering our practice area should expect to be screened again prior to entering our waiting area.

## 2019-01-31 NOTE — Telephone Encounter (Signed)
Already address.

## 2019-01-31 NOTE — Telephone Encounter (Signed)
°*  STAT* If patient is at the pharmacy, call can be transferred to refill team.   1. Which medications need to be refilled? (please list name of each medication and dose if known) Isosorbide 30 mg   2. Which pharmacy/location (including street and city if local pharmacy) is medication to be sent to? [facility] [employer] [address]. Lorina Rabon   3. Do they need a 30 day or 90 day supply? [place]

## 2019-02-03 ENCOUNTER — Other Ambulatory Visit: Payer: Self-pay

## 2019-02-03 ENCOUNTER — Ambulatory Visit (INDEPENDENT_AMBULATORY_CARE_PROVIDER_SITE_OTHER): Payer: Medicare Other | Admitting: *Deleted

## 2019-02-03 DIAGNOSIS — Z5181 Encounter for therapeutic drug level monitoring: Secondary | ICD-10-CM | POA: Diagnosis not present

## 2019-02-03 DIAGNOSIS — I480 Paroxysmal atrial fibrillation: Secondary | ICD-10-CM

## 2019-02-03 LAB — POCT INR: INR: 2.9 (ref 2.0–3.0)

## 2019-02-05 ENCOUNTER — Ambulatory Visit: Payer: Medicare Other | Admitting: Pulmonary Disease

## 2019-02-21 ENCOUNTER — Telehealth: Payer: Self-pay

## 2019-02-21 NOTE — Telephone Encounter (Signed)

## 2019-02-24 ENCOUNTER — Other Ambulatory Visit: Payer: Self-pay

## 2019-02-24 ENCOUNTER — Ambulatory Visit (INDEPENDENT_AMBULATORY_CARE_PROVIDER_SITE_OTHER): Payer: Medicare Other | Admitting: Pharmacist Clinician (PhC)/ Clinical Pharmacy Specialist

## 2019-02-24 DIAGNOSIS — I483 Typical atrial flutter: Secondary | ICD-10-CM

## 2019-02-24 DIAGNOSIS — I480 Paroxysmal atrial fibrillation: Secondary | ICD-10-CM | POA: Diagnosis not present

## 2019-02-24 DIAGNOSIS — Z5181 Encounter for therapeutic drug level monitoring: Secondary | ICD-10-CM

## 2019-02-24 LAB — POCT INR: INR: 2.2 (ref 2.0–3.0)

## 2019-03-05 ENCOUNTER — Telehealth: Payer: Self-pay

## 2019-03-05 NOTE — Telephone Encounter (Signed)
Received fax from [facility]- that the patients, [name] was not on formulary. And that she had not tried atleast two drugs that are on the formulary.Praluent injection is approved.  Will route to PharmD.

## 2019-03-17 ENCOUNTER — Telehealth: Payer: Self-pay | Admitting: Pulmonary Disease

## 2019-03-17 NOTE — Telephone Encounter (Signed)
Returned call to patient re: Forteo therapy. Pt started the drug about 2 months ago and now has questions after researching the drug She read about possible bone cancer in NIH study in some patients. Due to her having nodule on her right lung she is now having concerns? Please advise.

## 2019-03-17 NOTE — Telephone Encounter (Signed)
This looks like a medication that was prescribed by her PCP. Please have her call PCP office with question. Thanks.

## 2019-03-17 NOTE — Telephone Encounter (Signed)
Called and spoke with patient regarding [place] recommendations and response to pt's concerns Advised pt she will need to contact her PCP She verbalized understanding and will call PCP today Nothing further needed at this time.

## 2019-03-19 ENCOUNTER — Telehealth: Payer: Self-pay

## 2019-03-19 NOTE — Telephone Encounter (Signed)
Received documentation that praluent 75 mg/ml was approved by Medicare. The end date is until further notice.   Will scan into chart.

## 2019-03-20 ENCOUNTER — Other Ambulatory Visit (HOSPITAL_COMMUNITY): Payer: Self-pay | Admitting: Cardiovascular Disease

## 2019-03-20 ENCOUNTER — Encounter: Payer: Self-pay | Admitting: Pharmacist Clinician (PhC)/ Clinical Pharmacy Specialist

## 2019-03-20 DIAGNOSIS — I739 Peripheral vascular disease, unspecified: Secondary | ICD-10-CM

## 2019-03-24 NOTE — Telephone Encounter (Signed)
Pt updated. Pt state she has an appointment with Dr. Claiborne Billings on Wednesday and will discuss with him at that time.

## 2019-03-24 NOTE — Telephone Encounter (Signed)
Returned call to patient no answer.Left message on personal voice mail Praluent 75 mg approved by medicare.

## 2019-03-24 NOTE — Telephone Encounter (Signed)
Follow up    Patient is calling back in reference to [place].

## 2019-03-25 ENCOUNTER — Telehealth: Payer: Self-pay

## 2019-03-25 ENCOUNTER — Telehealth: Payer: Self-pay | Admitting: Cardiovascular Disease

## 2019-03-25 DIAGNOSIS — L239 Allergic contact dermatitis, unspecified cause: Secondary | ICD-10-CM | POA: Diagnosis not present

## 2019-03-25 NOTE — Telephone Encounter (Signed)
LMOM FOR PRESCREEN

## 2019-03-25 NOTE — Telephone Encounter (Signed)
Mych[place], smartphone, consent ( mychart msg 03/19/19) pre reg 03/25/19 AF

## 2019-03-26 ENCOUNTER — Telehealth (INDEPENDENT_AMBULATORY_CARE_PROVIDER_SITE_OTHER): Payer: Medicare Other | Admitting: Cardiovascular Disease

## 2019-03-26 ENCOUNTER — Other Ambulatory Visit: Payer: Self-pay

## 2019-03-26 ENCOUNTER — Ambulatory Visit (INDEPENDENT_AMBULATORY_CARE_PROVIDER_SITE_OTHER): Payer: Medicare Other | Admitting: Pharmacist

## 2019-03-26 ENCOUNTER — Encounter: Payer: Self-pay | Admitting: Cardiovascular Disease

## 2019-03-26 VITALS — BP 137/63 | HR 66 | Ht 62.0 in | Wt 148.0 lb

## 2019-03-26 DIAGNOSIS — Z7901 Long term (current) use of anticoagulants: Secondary | ICD-10-CM

## 2019-03-26 DIAGNOSIS — J449 Chronic obstructive pulmonary disease, unspecified: Secondary | ICD-10-CM | POA: Diagnosis not present

## 2019-03-26 DIAGNOSIS — I480 Paroxysmal atrial fibrillation: Secondary | ICD-10-CM

## 2019-03-26 DIAGNOSIS — Z5181 Encounter for therapeutic drug level monitoring: Secondary | ICD-10-CM | POA: Diagnosis not present

## 2019-03-26 DIAGNOSIS — G4733 Obstructive sleep apnea (adult) (pediatric): Secondary | ICD-10-CM

## 2019-03-26 DIAGNOSIS — I2510 Atherosclerotic heart disease of native coronary artery without angina pectoris: Secondary | ICD-10-CM

## 2019-03-26 DIAGNOSIS — J432 Centrilobular emphysema: Secondary | ICD-10-CM

## 2019-03-26 DIAGNOSIS — I10 Essential (primary) hypertension: Secondary | ICD-10-CM | POA: Diagnosis not present

## 2019-03-26 DIAGNOSIS — E785 Hyperlipidemia, unspecified: Secondary | ICD-10-CM

## 2019-03-26 LAB — POCT INR: INR: 3.1 — AB (ref 2.0–3.0)
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
## 2019-03-26 NOTE — Patient Instructions (Signed)
Medication Instructions:  The current medical regimen is effective;  continue present plan and medications.  If you need a refill on your cardiac medications before your next appointment, please call your pharmacy.   Lab work: TSH, MAG, LIPID, CBC, CMET If you have labs (blood work) drawn today and your tests are completely normal, you will receive your results only by: [name] MyChart Message (if you have MyChart) OR . A paper copy in the mail If you have any lab test that is abnormal or we need to change your treatment, we will call you to review the results.  Follow-Up: At [facility], you and your health needs are our priority.  As part of our continuing mission to provide you with exceptional heart care, we have created designated Provider Care Teams.  These Care Teams include your primary Cardiologist (physician) and Advanced Practice Providers (APPs -  Physician Assistants and Nurse Practitioners) who all work together to provide you with the care you need, when you need it. You will need a follow up appointment in 2-3 months. You may see Shelva Majestic, MD or one of the following Advanced Practice Providers on your designated Care Team: [place], [place] . Fabian Sharp, PA-C

## 2019-03-27 ENCOUNTER — Telehealth: Payer: Self-pay | Admitting: Cardiovascular Disease

## 2019-03-27 NOTE — Telephone Encounter (Signed)
New Message    Patient would like a nurse to call her to explain lab work and ABI study ordered by the doctor.

## 2019-03-27 NOTE — Telephone Encounter (Signed)
° °  Patient calling to complain that she doesn't like the process of ordering labs. Patient angry, stating our process doesn't work when labs are ordered. Advised patient of lab location and hours, and fasting is needed for lipid panel Patient requesting lab orders be mailed to her.

## 2019-03-27 NOTE — Telephone Encounter (Signed)
Pt states she was upset because no one informed her about how she is suppose to have her labs drawn. Informed pt that she is to come in the office anytime Mon-Fri 8 am to 4:30 pm. Pt requested that lab slip be mail to her.  Pt also state she was unaware that she was scheduled for an ABI on 04/22/19. Per chart review, order is placed for pt to have ABI in Oct. and the appointment scheduled on 6/9 is for her INR check. Pt voiced understanding.

## 2019-03-28 ENCOUNTER — Other Ambulatory Visit: Payer: Self-pay | Admitting: Cardiovascular Disease

## 2019-03-28 MED ORDER — NITROGLYCERIN 0.4 MG SL SUBL: 0.4000 mg | SUBLINGUAL_TABLET | SUBLINGUAL | 1 refills | Status: DC | PRN

## 2019-03-28 NOTE — Telephone Encounter (Signed)
°*  STAT* If patient is at the pharmacy, call can be transferred to refill team.   1. Which medications need to be refilled? (please list name of each medication and dose if known)  nitroGLYCERIN (NITROSTAT) 0.4 MG SL tablet  2. Which pharmacy/location (including street and city if local pharmacy) is medication to be sent to?    [facility] [place], [place]      3. Do they need a 30 day or 90 day supply? 30   rx is expired

## 2019-04-02 ENCOUNTER — Telehealth: Payer: Self-pay | Admitting: Pharmacist

## 2019-04-02 NOTE — Telephone Encounter (Signed)
Talked to [name] today about PASS program and Praluent 75mg  potential side effects.  Patient is currently recovering from allergic reaction to another medication.   Patient will call back to get 1 sample of Praluent 75mg  once recovered. Plan to apply for PASS program if able to tolerate Praluent sample.

## 2019-04-15 ENCOUNTER — Telehealth: Payer: Self-pay | Admitting: Pharmacist

## 2019-04-15 NOTE — Telephone Encounter (Signed)

## 2019-04-21 DIAGNOSIS — Z872 Personal history of diseases of the skin and subcutaneous tissue: Secondary | ICD-10-CM | POA: Diagnosis not present

## 2019-04-21 DIAGNOSIS — L821 Other seborrheic keratosis: Secondary | ICD-10-CM | POA: Diagnosis not present

## 2019-04-21 DIAGNOSIS — X32XXXA Exposure to sunlight, initial encounter: Secondary | ICD-10-CM | POA: Diagnosis not present

## 2019-04-21 DIAGNOSIS — Z85828 Personal history of other malignant neoplasm of skin: Secondary | ICD-10-CM | POA: Diagnosis not present

## 2019-04-21 DIAGNOSIS — L570 Actinic keratosis: Secondary | ICD-10-CM | POA: Diagnosis not present

## 2019-04-21 DIAGNOSIS — D2371 Other benign neoplasm of skin of right lower limb, including hip: Secondary | ICD-10-CM | POA: Diagnosis not present

## 2019-04-21 DIAGNOSIS — Z08 Encounter for follow-up examination after completed treatment for malignant neoplasm: Secondary | ICD-10-CM | POA: Diagnosis not present

## 2019-04-21 DIAGNOSIS — Z09 Encounter for follow-up examination after completed treatment for conditions other than malignant neoplasm: Secondary | ICD-10-CM | POA: Diagnosis not present

## 2019-04-23 DIAGNOSIS — H04123 Dry eye syndrome of bilateral lacrimal glands: Secondary | ICD-10-CM | POA: Diagnosis not present

## 2019-04-24 ENCOUNTER — Other Ambulatory Visit: Payer: Self-pay

## 2019-04-24 ENCOUNTER — Ambulatory Visit (INDEPENDENT_AMBULATORY_CARE_PROVIDER_SITE_OTHER): Payer: Medicare Other | Admitting: Pharmacist

## 2019-04-24 DIAGNOSIS — Z5181 Encounter for therapeutic drug level monitoring: Secondary | ICD-10-CM | POA: Diagnosis not present

## 2019-04-24 DIAGNOSIS — I739 Peripheral vascular disease, unspecified: Secondary | ICD-10-CM

## 2019-04-24 DIAGNOSIS — I480 Paroxysmal atrial fibrillation: Secondary | ICD-10-CM

## 2019-04-24 LAB — POCT INR: INR: 2.9 (ref 2.0–3.0)

## 2019-05-19 ENCOUNTER — Telehealth: Payer: Self-pay

## 2019-05-19 ENCOUNTER — Other Ambulatory Visit: Payer: Self-pay | Admitting: Surgery

## 2019-05-19 DIAGNOSIS — C3490 Malignant neoplasm of unspecified part of unspecified bronchus or lung: Secondary | ICD-10-CM

## 2019-05-19 NOTE — Telephone Encounter (Signed)

## 2019-05-26 ENCOUNTER — Ambulatory Visit (INDEPENDENT_AMBULATORY_CARE_PROVIDER_SITE_OTHER): Payer: Medicare Other | Admitting: *Deleted

## 2019-05-26 ENCOUNTER — Other Ambulatory Visit: Payer: Self-pay

## 2019-05-26 DIAGNOSIS — I480 Paroxysmal atrial fibrillation: Secondary | ICD-10-CM | POA: Diagnosis not present

## 2019-05-26 DIAGNOSIS — I10 Essential (primary) hypertension: Secondary | ICD-10-CM | POA: Diagnosis not present

## 2019-05-26 DIAGNOSIS — E785 Hyperlipidemia, unspecified: Secondary | ICD-10-CM | POA: Diagnosis not present

## 2019-05-26 DIAGNOSIS — Z5181 Encounter for therapeutic drug level monitoring: Secondary | ICD-10-CM | POA: Diagnosis not present

## 2019-05-26 LAB — POCT INR: INR: 3.1 — AB (ref 2.0–3.0)

## 2019-05-26 NOTE — Patient Instructions (Signed)
Description   HOLD warfarin dose today ONLY, Continue taking 1 tablet daily except 1/2 tablet on Sundays, Tuesdays, and Thursdays. Continue stable serving of greens.Repeat INR in 2 weeks.

## 2019-05-27 LAB — CBC
Hematocrit: 38.8 % (ref 34.0–46.6)
Hemoglobin: 13.3 g/dL (ref 11.1–15.9)
MCH: 30.9 pg (ref 26.6–33.0)
MCHC: 34.3 g/dL (ref 31.5–35.7)
MCV: 90 fL (ref 79–97)
Platelets: 184 10*3/uL (ref 150–450)
RBC: 4.31 x10E6/uL (ref 3.77–5.28)
RDW: 13.5 % (ref 11.7–15.4)
WBC: 12.9 10*3/uL — ABNORMAL HIGH (ref 3.4–10.8)

## 2019-05-27 LAB — COMPREHENSIVE METABOLIC PANEL
ALT: 15 IU/L (ref 0–32)
AST: 18 IU/L (ref 0–40)
Albumin/Globulin Ratio: 3.1 — ABNORMAL HIGH (ref 1.2–2.2)
Albumin: 4.7 g/dL (ref 3.7–4.7)
Alkaline Phosphatase: 59 IU/L (ref 39–117)
BUN/Creatinine Ratio: 22 (ref 12–28)
BUN: 15 mg/dL (ref 8–27)
Bilirubin Total: 0.3 mg/dL (ref 0.0–1.2)
CO2: 23 mmol/L (ref 20–29)
Calcium: 9.8 mg/dL (ref 8.7–10.3)
Chloride: 97 mmol/L (ref 96–106)
Creatinine, Ser: 0.67 mg/dL (ref 0.57–1.00)
GFR calc Af Amer: 100 mL/min/{1.73_m2} (ref >59)
GFR calc non Af Amer: 87 mL/min/{1.73_m2} (ref >59)
Globulin, Total: 1.5 g/dL (ref 1.5–4.5)
Glucose: 101 mg/dL — ABNORMAL HIGH (ref 65–99)
Potassium: 4.9 mmol/L (ref 3.5–5.2)
Sodium: 135 mmol/L (ref 134–144)
Total Protein: 6.2 g/dL (ref 6.0–8.5)

## 2019-05-27 LAB — TSH: TSH: 1.95 u[IU]/mL (ref 0.450–4.500)

## 2019-05-27 LAB — LIPID PANEL
Chol/HDL Ratio: 2.4 ratio (ref 0.0–4.4)
Cholesterol, Total: 162 mg/dL (ref 100–199)
HDL: 67 mg/dL (ref >39)
LDL Calculated: 79 mg/dL (ref 0–99)
Triglycerides: 78 mg/dL (ref 0–149)
VLDL Cholesterol Cal: 16 mg/dL (ref 5–40)

## 2019-05-27 LAB — MAGNESIUM: Magnesium: 1.8 mg/dL (ref 1.6–2.3)

## 2019-05-28 ENCOUNTER — Telehealth: Payer: Self-pay | Admitting: Cardiovascular Disease

## 2019-05-28 NOTE — Telephone Encounter (Signed)
Called patient, and advised of lab work and answered all questions patient had at this time.

## 2019-05-28 NOTE — Telephone Encounter (Signed)
New Message    Patient would like a nurse to call her to go over lab results.  Specifically white cell count and she would like to know what it was last time as well.

## 2019-06-01 ENCOUNTER — Other Ambulatory Visit: Payer: Self-pay

## 2019-06-01 ENCOUNTER — Encounter (HOSPITAL_COMMUNITY): Payer: Self-pay

## 2019-06-01 ENCOUNTER — Emergency Department (HOSPITAL_COMMUNITY): Payer: Medicare Other

## 2019-06-01 ENCOUNTER — Emergency Department (HOSPITAL_COMMUNITY)
Admission: EM | Admit: 2019-06-01 | Discharge: 2019-06-01 | Disposition: A | Discharge status: Home / Self Care | Payer: Medicare Other | Attending: Emergency Medicine | Admitting: Emergency Medicine

## 2019-06-01 DIAGNOSIS — Z7901 Long term (current) use of anticoagulants: Secondary | ICD-10-CM | POA: Insufficient documentation

## 2019-06-01 DIAGNOSIS — R42 Dizziness and giddiness: Secondary | ICD-10-CM | POA: Insufficient documentation

## 2019-06-01 DIAGNOSIS — R110 Nausea: Secondary | ICD-10-CM | POA: Diagnosis not present

## 2019-06-01 DIAGNOSIS — I10 Essential (primary) hypertension: Secondary | ICD-10-CM | POA: Insufficient documentation

## 2019-06-01 DIAGNOSIS — Z87891 Personal history of nicotine dependence: Secondary | ICD-10-CM | POA: Diagnosis not present

## 2019-06-01 DIAGNOSIS — Z951 Presence of aortocoronary bypass graft: Secondary | ICD-10-CM | POA: Diagnosis not present

## 2019-06-01 DIAGNOSIS — R197 Diarrhea, unspecified: Secondary | ICD-10-CM

## 2019-06-01 DIAGNOSIS — Z85118 Personal history of other malignant neoplasm of bronchus and lung: Secondary | ICD-10-CM | POA: Diagnosis not present

## 2019-06-01 DIAGNOSIS — R0789 Other chest pain: Secondary | ICD-10-CM | POA: Insufficient documentation

## 2019-06-01 DIAGNOSIS — Z79899 Other long term (current) drug therapy: Secondary | ICD-10-CM | POA: Diagnosis not present

## 2019-06-01 DIAGNOSIS — I480 Paroxysmal atrial fibrillation: Secondary | ICD-10-CM | POA: Diagnosis not present

## 2019-06-01 DIAGNOSIS — D72829 Elevated white blood cell count, unspecified: Secondary | ICD-10-CM | POA: Diagnosis not present

## 2019-06-01 DIAGNOSIS — R000 Tachycardia, unspecified: Secondary | ICD-10-CM | POA: Diagnosis not present

## 2019-06-01 DIAGNOSIS — I259 Chronic ischemic heart disease, unspecified: Secondary | ICD-10-CM | POA: Insufficient documentation

## 2019-06-01 DIAGNOSIS — R0602 Shortness of breath: Secondary | ICD-10-CM | POA: Diagnosis not present

## 2019-06-01 DIAGNOSIS — R079 Chest pain, unspecified: Secondary | ICD-10-CM | POA: Diagnosis not present

## 2019-06-01 LAB — CBC
HCT: 42.4 % (ref 36.0–46.0)
Hemoglobin: 14.2 g/dL (ref 12.0–15.0)
MCH: 30.8 pg (ref 26.0–34.0)
MCHC: 33.5 g/dL (ref 30.0–36.0)
MCV: 92 fL (ref 80.0–100.0)
Platelets: 184 10*3/uL (ref 150–400)
RBC: 4.61 MIL/uL (ref 3.87–5.11)
RDW: 13.6 % (ref 11.5–15.5)
WBC: 17.5 10*3/uL — ABNORMAL HIGH (ref 4.0–10.5)
nRBC: 0 % (ref 0.0–0.2)

## 2019-06-01 LAB — LACTIC ACID, PLASMA
Lactic Acid, Venous: 1 mmol/L (ref 0.5–1.9)
Lactic Acid, Venous: 0.8 mmol/L (ref 0.5–1.9)

## 2019-06-01 LAB — URINALYSIS, ROUTINE W REFLEX MICROSCOPIC
Bacteria, UA: NONE SEEN
Bilirubin Urine: NEGATIVE
Glucose, UA: NEGATIVE mg/dL
Ketones, ur: NEGATIVE mg/dL
Nitrite: NEGATIVE
Protein, ur: NEGATIVE mg/dL
Specific Gravity, Urine: 1.009 (ref 1.005–1.030)
pH: 5 (ref 5.0–8.0)

## 2019-06-01 LAB — BASIC METABOLIC PANEL
Anion gap: 9 (ref 5–15)
BUN: 16 mg/dL (ref 8–23)
CO2: 25 mmol/L (ref 22–32)
Calcium: 9.4 mg/dL (ref 8.9–10.3)
Chloride: 99 mmol/L (ref 98–111)
Creatinine, Ser: 0.88 mg/dL (ref 0.44–1.00)
GFR calc Af Amer: >60 mL/min (ref >60)
GFR calc non Af Amer: >60 mL/min (ref >60)
Glucose, Bld: 122 mg/dL — ABNORMAL HIGH (ref 70–99)
Potassium: 4.9 mmol/L (ref 3.5–5.1)
Sodium: 133 mmol/L — ABNORMAL LOW (ref 135–145)

## 2019-06-01 LAB — TROPONIN I (HIGH SENSITIVITY)
Troponin I (High Sensitivity): 2 ng/L (ref <18)
Troponin I (High Sensitivity): 3 ng/L (ref <18)

## 2019-06-01 LAB — PROTIME-INR
INR: 2.2 — ABNORMAL HIGH (ref 0.8–1.2)
Prothrombin Time: 23.9 seconds — ABNORMAL HIGH (ref 11.4–15.2)

## 2019-06-01 LAB — D-DIMER, QUANTITATIVE: D-Dimer, Quant: 1.04 ug/mL-FEU — ABNORMAL HIGH (ref 0.00–0.50)

## 2019-06-01 MED ORDER — IOHEXOL 350 MG/ML SOLN
75.0000 mL | Freq: Once | INTRAVENOUS | Status: AC | PRN
  Administered 2019-06-01: 75 mL via INTRAVENOUS

## 2019-06-01 MED ORDER — SODIUM CHLORIDE 0.9 % IV BOLUS
500.0000 mL | Freq: Once | INTRAVENOUS | Status: AC
  Administered 2019-06-01: 500 mL via INTRAVENOUS

## 2019-06-01 MED ORDER — SODIUM CHLORIDE 0.9 % IV BOLUS: 1000.0000 mL | Freq: Once | INTRAVENOUS | Status: DC

## 2019-06-01 NOTE — ED Notes (Signed)
Pt transported to XRay 

## 2019-06-01 NOTE — ED Provider Notes (Signed)
  Face-to-face evaluation   History: Patient is here for concerned that she has cancer recurrence.  She noticed a swollen and possible tender lymph node on the left side of her neck below her ear, yesterday.  Today she had an episode of abdominal bloating followed by diarrhea, and associated diaphoresis, all of which was transient.  There is been no nausea or vomiting.  She is worried that she might be having another heart attack, as well.  Physical exam: Alert, calm and cooperative.  Mouth is moist there are no oral lesions.  Neck is without palpable adenopathy, deformity or swelling.  Chest is nontender to palpation.  Abdomen soft and nontender to palpation.   Medical screening examination/treatment/procedure(s) were conducted as a shared visit with non-physician practitioner(s) and myself.  I personally evaluated the patient during the encounter    Daleen Bo, MD 06/02/19 1100

## 2019-06-01 NOTE — ED Notes (Signed)
Patient is alert and orientedx4.  Patient was explained discharge instructions and they understood them with no questions.   
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
## 2019-06-01 NOTE — ED Triage Notes (Signed)
Similar symptoms as when she had MI in 2008 and 2015 when colon ruptured, nausea, vomiting, diarrhea all suddenly and then near syncopal episode.

## 2019-06-01 NOTE — Discharge Instructions (Addendum)
Work-up today was vastly reassuring.  Your WBC today was 17.  This finding is nonspecific.  Infectious work-up was negative today.  Your blood clot lab work was elevated so a CT was obtained of your chest.  This showed stable postsurgical changes but no pulmonary embolism or concerning findings on your chest.  The cause of your symptoms is still unclear.  Your blood pressure dropped when you stood up today which can explain your lightheadedness.  This may be from mild dehydration.  Stay well hydrated, drink plenty of fluids.  Follow-up with your primary care doctor in 2 to 3 days for reevaluation of your symptoms to ensure that they have improved.  Return to the ER for fever greater than 100, chest pain or shortness of breath with exertion, vomiting, worsening diarrhea, abdominal pain

## 2019-06-02 LAB — URINE CULTURE

## 2019-06-03 DIAGNOSIS — R197 Diarrhea, unspecified: Secondary | ICD-10-CM | POA: Diagnosis not present

## 2019-06-04 ENCOUNTER — Other Ambulatory Visit: Payer: Self-pay | Admitting: Family Medicine

## 2019-06-04 DIAGNOSIS — R197 Diarrhea, unspecified: Secondary | ICD-10-CM

## 2019-06-04 DIAGNOSIS — R519 Headache, unspecified: Secondary | ICD-10-CM

## 2019-06-04 DIAGNOSIS — G8929 Other chronic pain: Secondary | ICD-10-CM

## 2019-06-04 DIAGNOSIS — R1084 Generalized abdominal pain: Secondary | ICD-10-CM

## 2019-06-04 DIAGNOSIS — R42 Dizziness and giddiness: Secondary | ICD-10-CM

## 2019-06-04 DIAGNOSIS — R6884 Jaw pain: Secondary | ICD-10-CM

## 2019-06-10 DIAGNOSIS — L98499 Non-pressure chronic ulcer of skin of other sites with unspecified severity: Secondary | ICD-10-CM | POA: Diagnosis not present

## 2019-06-10 DIAGNOSIS — L923 Foreign body granuloma of the skin and subcutaneous tissue: Secondary | ICD-10-CM | POA: Diagnosis not present

## 2019-06-11 ENCOUNTER — Ambulatory Visit (INDEPENDENT_AMBULATORY_CARE_PROVIDER_SITE_OTHER): Payer: Medicare Other | Admitting: Pharmacist

## 2019-06-11 ENCOUNTER — Other Ambulatory Visit: Payer: Self-pay

## 2019-06-11 DIAGNOSIS — J301 Allergic rhinitis due to pollen: Secondary | ICD-10-CM | POA: Diagnosis not present

## 2019-06-11 DIAGNOSIS — R5383 Other fatigue: Secondary | ICD-10-CM | POA: Diagnosis not present

## 2019-06-11 DIAGNOSIS — R6889 Other general symptoms and signs: Secondary | ICD-10-CM | POA: Diagnosis not present

## 2019-06-11 DIAGNOSIS — D72829 Elevated white blood cell count, unspecified: Secondary | ICD-10-CM | POA: Diagnosis not present

## 2019-06-11 DIAGNOSIS — I480 Paroxysmal atrial fibrillation: Secondary | ICD-10-CM

## 2019-06-11 DIAGNOSIS — Z5181 Encounter for therapeutic drug level monitoring: Secondary | ICD-10-CM | POA: Diagnosis not present

## 2019-06-11 DIAGNOSIS — R8290 Unspecified abnormal findings in urine: Secondary | ICD-10-CM | POA: Diagnosis not present

## 2019-06-11 DIAGNOSIS — R5381 Other malaise: Secondary | ICD-10-CM | POA: Diagnosis not present

## 2019-06-11 DIAGNOSIS — E871 Hypo-osmolality and hyponatremia: Secondary | ICD-10-CM | POA: Diagnosis not present

## 2019-06-11 DIAGNOSIS — K219 Gastro-esophageal reflux disease without esophagitis: Secondary | ICD-10-CM | POA: Diagnosis not present

## 2019-06-11 DIAGNOSIS — R109 Unspecified abdominal pain: Secondary | ICD-10-CM | POA: Diagnosis not present

## 2019-06-11 LAB — POCT INR: INR: 2.5 (ref 2.0–3.0)

## 2019-06-13 ENCOUNTER — Ambulatory Visit
Admission: RE | Admit: 2019-06-13 | Discharge: 2019-06-13 | Disposition: A | Discharge status: Home / Self Care | Payer: Medicare Other | Source: Ambulatory Visit | Attending: Family Medicine | Admitting: Family Medicine

## 2019-06-13 ENCOUNTER — Other Ambulatory Visit: Payer: Self-pay

## 2019-06-13 DIAGNOSIS — R197 Diarrhea, unspecified: Secondary | ICD-10-CM | POA: Insufficient documentation

## 2019-06-13 DIAGNOSIS — K5900 Constipation, unspecified: Secondary | ICD-10-CM | POA: Diagnosis not present

## 2019-06-13 DIAGNOSIS — R6884 Jaw pain: Secondary | ICD-10-CM | POA: Diagnosis not present

## 2019-06-13 DIAGNOSIS — R42 Dizziness and giddiness: Secondary | ICD-10-CM | POA: Diagnosis not present

## 2019-06-13 DIAGNOSIS — R519 Headache, unspecified: Secondary | ICD-10-CM

## 2019-06-13 DIAGNOSIS — R1084 Generalized abdominal pain: Secondary | ICD-10-CM | POA: Insufficient documentation

## 2019-06-13 DIAGNOSIS — R51 Headache: Secondary | ICD-10-CM | POA: Insufficient documentation

## 2019-06-13 DIAGNOSIS — G8929 Other chronic pain: Secondary | ICD-10-CM

## 2019-06-13 MED ORDER — IOHEXOL 300 MG/ML  SOLN
100.0000 mL | Freq: Once | INTRAMUSCULAR | Status: AC | PRN
  Administered 2019-06-13: 100 mL via INTRAVENOUS

## 2019-06-19 ENCOUNTER — Other Ambulatory Visit: Payer: Self-pay

## 2019-06-19 DIAGNOSIS — E538 Deficiency of other specified B group vitamins: Secondary | ICD-10-CM | POA: Diagnosis not present

## 2019-06-19 DIAGNOSIS — R5383 Other fatigue: Secondary | ICD-10-CM | POA: Diagnosis not present

## 2019-06-19 DIAGNOSIS — I10 Essential (primary) hypertension: Secondary | ICD-10-CM | POA: Diagnosis not present

## 2019-06-19 DIAGNOSIS — Z0000 Encounter for general adult medical examination without abnormal findings: Secondary | ICD-10-CM | POA: Diagnosis not present

## 2019-06-19 DIAGNOSIS — R5381 Other malaise: Secondary | ICD-10-CM | POA: Diagnosis not present

## 2019-06-19 DIAGNOSIS — D72820 Lymphocytosis (symptomatic): Secondary | ICD-10-CM | POA: Diagnosis not present

## 2019-06-19 DIAGNOSIS — Z23 Encounter for immunization: Secondary | ICD-10-CM | POA: Diagnosis not present

## 2019-06-20 ENCOUNTER — Inpatient Hospital Stay: Payer: Medicare Other | Attending: Oncology | Admitting: Oncology

## 2019-06-20 ENCOUNTER — Encounter: Payer: Self-pay | Admitting: Oncology

## 2019-06-20 ENCOUNTER — Other Ambulatory Visit: Payer: Self-pay

## 2019-06-20 ENCOUNTER — Inpatient Hospital Stay: Payer: Medicare Other

## 2019-06-20 VITALS — BP 152/76 | HR 57 | Temp 97.1°F | Resp 20 | Ht 64.37 in | Wt 151.6 lb

## 2019-06-20 DIAGNOSIS — I480 Paroxysmal atrial fibrillation: Secondary | ICD-10-CM | POA: Insufficient documentation

## 2019-06-20 DIAGNOSIS — F419 Anxiety disorder, unspecified: Secondary | ICD-10-CM | POA: Insufficient documentation

## 2019-06-20 DIAGNOSIS — I2510 Atherosclerotic heart disease of native coronary artery without angina pectoris: Secondary | ICD-10-CM | POA: Insufficient documentation

## 2019-06-20 DIAGNOSIS — I110 Hypertensive heart disease with heart failure: Secondary | ICD-10-CM | POA: Insufficient documentation

## 2019-06-20 DIAGNOSIS — Z7901 Long term (current) use of anticoagulants: Secondary | ICD-10-CM | POA: Diagnosis not present

## 2019-06-20 DIAGNOSIS — E785 Hyperlipidemia, unspecified: Secondary | ICD-10-CM | POA: Insufficient documentation

## 2019-06-20 DIAGNOSIS — D72820 Lymphocytosis (symptomatic): Secondary | ICD-10-CM | POA: Diagnosis not present

## 2019-06-20 DIAGNOSIS — Z79899 Other long term (current) drug therapy: Secondary | ICD-10-CM | POA: Insufficient documentation

## 2019-06-20 DIAGNOSIS — I252 Old myocardial infarction: Secondary | ICD-10-CM | POA: Diagnosis not present

## 2019-06-20 DIAGNOSIS — G4733 Obstructive sleep apnea (adult) (pediatric): Secondary | ICD-10-CM | POA: Diagnosis not present

## 2019-06-20 DIAGNOSIS — Z87891 Personal history of nicotine dependence: Secondary | ICD-10-CM | POA: Insufficient documentation

## 2019-06-20 DIAGNOSIS — Z933 Colostomy status: Secondary | ICD-10-CM | POA: Insufficient documentation

## 2019-06-20 DIAGNOSIS — E7800 Pure hypercholesterolemia, unspecified: Secondary | ICD-10-CM | POA: Insufficient documentation

## 2019-06-20 LAB — CBC WITH DIFFERENTIAL/PLATELET
Abs Immature Granulocytes: 0.06 10*3/uL (ref 0.00–0.07)
Basophils Absolute: 0.1 10*3/uL (ref 0.0–0.1)
Basophils Relative: 1 %
Eosinophils Absolute: 0.1 10*3/uL (ref 0.0–0.5)
Eosinophils Relative: 1 %
HCT: 38.3 % (ref 36.0–46.0)
Hemoglobin: 13 g/dL (ref 12.0–15.0)
Immature Granulocytes: 0 %
Lymphocytes Relative: 53 %
Lymphs Abs: 7.7 10*3/uL — ABNORMAL HIGH (ref 0.7–4.0)
MCH: 30.9 pg (ref 26.0–34.0)
MCHC: 33.9 g/dL (ref 30.0–36.0)
MCV: 91 fL (ref 80.0–100.0)
Monocytes Absolute: 1.2 10*3/uL — ABNORMAL HIGH (ref 0.1–1.0)
Monocytes Relative: 8 %
Neutro Abs: 5.4 10*3/uL (ref 1.7–7.7)
Neutrophils Relative %: 37 %
Platelets: 180 10*3/uL (ref 150–400)
RBC: 4.21 MIL/uL (ref 3.87–5.11)
RDW: 13.6 % (ref 11.5–15.5)
WBC: 14.5 10*3/uL — ABNORMAL HIGH (ref 4.0–10.5)
nRBC: 0 % (ref 0.0–0.2)

## 2019-06-20 NOTE — Progress Notes (Signed)
Patient is referred by PCP for elevated WBC.  Has history of lung cancer in 2018 and had lower left lobe of lung removed.  She was not referred to oncology at that time.

## 2019-06-23 ENCOUNTER — Encounter: Payer: Self-pay | Admitting: Oncology

## 2019-06-23 LAB — COMP PANEL: LEUKEMIA/LYMPHOMA: Immunophenotypic Profile: 35
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
## 2019-06-26 DIAGNOSIS — E538 Deficiency of other specified B group vitamins: Secondary | ICD-10-CM | POA: Diagnosis not present

## 2019-06-30 ENCOUNTER — Telehealth: Payer: Self-pay | Admitting: *Deleted

## 2019-06-30 NOTE — Telephone Encounter (Signed)
Will call her today

## 2019-06-30 NOTE — Telephone Encounter (Signed)
Patient called stating she is trying to understand her results, asking if it is a new marker or old marker. She reports she has an appointment Wednesday to see her Thoracic surgeon who removed part of her lung 2 years ago and would like to understand these lab results before she sees him. Please return her call [phone]  Flow cytometry panel-leukemia/lymphoma work-up Order: [id number] Status:  Edited Result - FINAL Visible to patient:  Yes (MyChart) Next appt:  07/02/2019 at 11:30 AM in Cardiothoracic Surgery Gaye Pollack, MD) Dx:  Lymphocytosis Component 10d ago  PATH INTERP XXX-IMP Comment   Comment: (NOTE)  The phenotype is most consistent with a diagnosis of chronic  lymphocytic  leukemia/small lymphocytic lymphoma (CLL/SLL), CD38-   ANNOTATION COMMENT IMP Comment VC   Comment: (NOTE)  CLL FISH testing and IGHV mutation status is recommended for prognosis  assessment initial comprehensive workup of CLL patients. Clinical  correlation is recommended.   CLINICAL INFO Comment VC   Comment: (NOTE)  Lymphocytosis  Accompanying CBC dated 06/20/2019 shows:  WBC count 14.5, Neu 5.4, Lym 7.7, Mon 1.2.   Specimen Type Comment   Comment: Peripheral blood  ASSESSMENT OF LEUKOCYTES Comment   Comment: (NOTE)  A monoclonal B cell population is detected with lambda light chain  restriction.  There is no loss of, or aberrant expression of, the pan T cell  antigens to  suggest a neoplastic T cell process.  CD4:CD8 ratio 1.5  No circulating blasts are detected.  Rare neutrophils show left-shifted maturation.There is no  immunophenotypic  evidence of abnormal myeloid maturation.  Analysis of the leukocyte population shows: granulocytes 44%,  monocytes 3%,  lymphocytes 53%, blasts <0.1%, B cells 36%, T cells 14%, NK cells 3%.   % Viable Cells Comment VC   Comment: 95%  Immunophenotypic Profile 35% of total cells (Phenotype below) VC   Comment: Comment  Abnormal cell population: present    ANALYSIS AND GATING STRATEGY Comment   Comment: 8 color analysis with CD45/SSC gating  IMMUNOPHENOTYPING STUDY Comment   Comment: (NOTE)  CD2    (-)      CD3    (-)  CD4    (-)      CD5    (+)  CD7    (-)      CD8    (-)  CD10   (-)      CD11b   (+)  CD13   (-)      CD14   (-)  CD16   (-)      CD19   (+)  CD20   (+) Dim    CD22   (+) Dim  CD23   (+) Bright   CD33   (-)  CD34   (-)      CD38   (-)  CD45   (+)      CD56   (-)  CD57   (-)      CD103   (-)  CD117   (-)      FMC-7   (-)  HLA-DR  (+)      KAPPA   (-)  LAMBDA  (+) Dim    CD64   (-)   PATHOLOGIST NAME Comment   Comment: Henrietta Hoover, M.D.  COMMENT: Comment VC   Comment: (NOTE)  Each antibody in this assay was utilized to assess for potential  abnormalities of studied cell populations or to characterize  identified abnormalities.  This test was developed and its  performance characteristics  determined by [facility]. It has not been cleared or approved by the  U.S. Food and Drug Administration.  The FDA has determined that such clearance or approval is not  necessary. This test is used for clinical purposes. It should not  be regarded as investigational or for research.  Performed At: -[facility]  [place], [place] [id number]  Katina Degree MDPhD [id number]  Performed At: [facility]  [address] [place], [place] [id number]  Katina Degree MDPhD [id number]   Resulting Agency [facility] CLIN LAB      Specimen Collected: 06/20/19 15:04 Last Resulted: 06/23/19 17:35     Lab Flowsheet   Order Details   View Encounter   Lab and Collection Details   Routing   Result History     VC=Value has a corrected status       Other Results from 06/20/2019  Contains abnormal data CBC with Differential/Platelet Order: [id number]  Status:   Final result Visible to patient:  Yes (MyChart) Next appt:  07/02/2019 at 11:30 AM in Cardiothoracic Surgery Gaye Pollack, MD) Dx:  Lymphocytosis  Ref Range & Units 10d ago 4wk ago 52moago  WBC 4.0 - 10.5 K/uL 14.5High   17.5High   12.9High  R   RBC 3.87 - 5.11 MIL/uL 4.21  4.61  4.31 R   Hemoglobin 12.0 - 15.0 g/dL 13.0  14.2  13.3 R   HCT 36.0 - 46.0 % 38.3  42.4  38.8 R   MCV 80.0 - 100.0 fL 91.0  92.0  90 R   MCH 26.0 - 34.0 pg 30.9  30.8  30.9 R   MCHC 30.0 - 36.0 g/dL 33.9  33.5  34.3 R   RDW 11.5 - 15.5 % 13.6  13.6  13.5 R   Platelets 150 - 400 K/uL 180  184  184 R   nRBC 0.0 - 0.2 % 0.0  0.0 CM    Neutrophils Relative % % 37     Neutro Abs 1.7 - 7.7 K/uL 5.4     Lymphocytes Relative % 53     Lymphs Abs 0.7 - 4.0 K/uL 7.7High      Monocytes Relative % 8     Monocytes Absolute 0.1 - 1.0 K/uL 1.2High      Eosinophils Relative % 1     Eosinophils Absolute 0.0 - 0.5 K/uL 0.1     Basophils Relative % 1     Basophils Absolute 0.0 - 0.1 K/uL 0.1     Immature Granulocytes % 0     Abs Immature Granulocytes 0.00 - 0.07 K/uL 0.06     Comment: [facility] [address] [place] [address] Resulting Agency  [facility]      Specimen Collected: 06/20/19 15:04 Last Resulted: 06/20/19 15:19

## 2019-06-30 NOTE — Telephone Encounter (Signed)
Informed patient that Dr Janese Banks will call her later today

## 2019-07-02 ENCOUNTER — Other Ambulatory Visit: Payer: Self-pay

## 2019-07-02 ENCOUNTER — Encounter: Payer: Self-pay | Admitting: Surgery

## 2019-07-02 ENCOUNTER — Other Ambulatory Visit: Payer: Medicare Other

## 2019-07-02 ENCOUNTER — Ambulatory Visit (INDEPENDENT_AMBULATORY_CARE_PROVIDER_SITE_OTHER): Payer: Medicare Other | Admitting: Surgery

## 2019-07-02 VITALS — BP 135/69 | HR 60 | Temp 97.6°F | Resp 20 | Ht 63.0 in | Wt 152.0 lb

## 2019-07-02 DIAGNOSIS — Z85118 Personal history of other malignant neoplasm of bronchus and lung: Secondary | ICD-10-CM | POA: Diagnosis not present

## 2019-07-02 DIAGNOSIS — Z902 Acquired absence of lung [part of]: Secondary | ICD-10-CM | POA: Diagnosis not present

## 2019-07-02 DIAGNOSIS — I2510 Atherosclerotic heart disease of native coronary artery without angina pectoris: Secondary | ICD-10-CM

## 2019-07-02 NOTE — Progress Notes (Signed)
HPI:  The patient returns for routine surveillance follow-up having undergone left muscle-sparing thoracotomy and left lower lobectomy on 07/02/2017 for a T1, N0, N0 squamous cell carcinoma of the lung.  She had lymphovascular invasion but negative resection margins.  Since I last saw her she said that she was diagnosed with CLL but it is early stage and does not require any treatment.  She has also been diagnosed with B12 deficiency and is getting injections and oral replacement for that.  She has an atypical chest pain a few weeks ago and had a CTA of the chest which ruled out pulmonary embolism.  She denies any cough or hemoptysis.  She has some mild chronic shortness of breath due to COPD.  She has been eating and maintaining her weight.  She denies any headache or visual changes.  She has been fatigued over the past several months.  Current Outpatient Medications  Medication Sig Dispense Refill  . acetaminophen (TYLENOL) 500 MG tablet Take 500 mg by mouth every 8 (eight) hours as needed for mild pain or moderate pain.    . Calcium Carb-Cholecalciferol (CALTRATE 600+D) 600-800 MG-UNIT TABS Take 1 tablet by mouth daily.     . Cholecalciferol (VITAMIN D-3) 5000 UNITS TABS Take 5,000 Units by mouth daily.     [name] co-enzyme Q-10 30 MG capsule Take 100 mg by mouth daily.    [name] ezetimibe (ZETIA) 10 MG tablet TAKE 1 TABLET(10 MG) BY MOUTH EVERY EVENING 90 tablet 3  . hydrocortisone valerate cream (WESTCORT) 0.2 % Apply 1 application topically 3 (three) times a week. On random days - for eczema in ear    . INCRUSE ELLIPTA 62.5 MCG/INH AEPB INHALE 1 PUFF INTO THE LUNGS DAILY 30 each 11  . isosorbide mononitrate (IMDUR) 30 MG 24 hr tablet TAKE 1 TABLET(30 MG) BY MOUTH TWICE DAILY 180 tablet 1  . lisinopril (PRINIVIL,ZESTRIL) 20 MG tablet TAKE 1 TABLET(20 MG) BY MOUTH TWICE DAILY 180 tablet 1  . metoprolol succinate (TOPROL-XL) 25 MG 24 hr tablet TAKE 1.5 TABLET BY MOUTH DAILY (Patient taking  differently: 25 mg daily. TAKE 1 TABLET BY MOUTH DAILY) 90 tablet 3  . nitroGLYCERIN (NITROSTAT) 0.4 MG SL tablet Place 1 tablet (0.4 mg total) under the tongue every 5 (five) minutes x 3 doses as needed for chest pain. 25 tablet 1  . NON FORMULARY Place 1 drop into both eyes 4 (four) times daily.    [name] pyridOXINE (VITAMIN B-6) 100 MG tablet Take 100 mg by mouth daily.    . valACYclovir (VALTREX) 500 MG tablet Take 500 mg by mouth daily as needed. For cold sores    . VENTOLIN HFA 108 (90 Base) MCG/ACT inhaler INHALE 1 TO 2 PUFFS INTO THE LUNGS EVERY 6 HOURS AS NEEDED FOR WHEEZING OR SHORTNESS OF BREATH 18 g 3  . warfarin (COUMADIN) 2.5 MG tablet TAKE 1 TO 1 AND 1/2 TABLETS BY MOUTH DAILY AS DIRECTED BY COUMADIN CLINIC (Patient taking differently: 2.5 mg. TAKE 1/2 TAB  TO 1 TAB TABLETS BY MOUTH DAILY AS DIRECTED BY COUMADIN CLINIC) 135 tablet 0  . rosuvastatin (CRESTOR) 10 MG tablet Take 1 tablet (10 mg total) by mouth daily. 90 tablet 3  . triamcinolone ointment (KENALOG) 0.1 % APP AA ON FACE BID UNTIL CLEAR. APP A COOL COMPRESS PRN     No current facility-administered medications for this visit.      Physical Exam: BP 135/69   Pulse 60   Temp 97.6  F (36.4 C) (Skin)   Resp 20   Ht 5\' 3"  (1.6 m)   Wt 152 lb (68.9 kg)   SpO2 95%   BMI 26.93 kg/m   She looks well. There is no cervical or supraclavicular adenopathy. Lung exam is clear. Cardiac exam shows regular rate and rhythm with normal heart sounds.  Diagnostic Tests:  CLINICAL DATA:  PE suspected. Atypical chest pain. History of remote left lung cancer status post VATS.  EXAM: CT ANGIOGRAPHY CHEST WITH CONTRAST  TECHNIQUE: Multidetector CT imaging of the chest was performed using the standard protocol during bolus administration of intravenous contrast. Multiplanar CT image reconstructions and MIPs were obtained to evaluate the vascular anatomy.  CONTRAST:  60mL OMNIPAQUE IOHEXOL 350 MG/ML SOLN  COMPARISON:  CT  chest dated December 25, 2018.  FINDINGS: Cardiovascular: Contrast injection is sufficient to demonstrate satisfactory opacification of the pulmonary arteries to the segmental level. There is no pulmonary embolus. The main pulmonary artery is within normal limits for size. There is no CT evidence of acute right heart strain. The visualized aorta is normal. Heart size is normal, without pericardial effusion. Atherosclerotic changes are noted of the thoracic aorta. Extensive coronary artery calcifications are noted.  Mediastinum/Nodes:  --there are few prominent right hilar lymph nodes that appear grossly stable when compared to prior study. There is no significant mediastinal adenopathy.  --No axillary lymphadenopathy.  --No supraclavicular lymphadenopathy.  --Normal thyroid gland.  --The esophagus is unremarkable  Lungs/Pleura: Patient is status post left lower lobe lobectomy. There is a small left-sided pleural effusion, similar to prior study. Moderate emphysematous changes are noted bilaterally. There is no pneumothorax. The trachea is grossly unremarkable.  Upper Abdomen: No acute abnormality.  Musculoskeletal: There is stable height loss of several thoracic vertebral bodies. There is no acute displaced fracture there are old healed left-sided rib fractures.  Review of the MIP images confirms the above findings.  IMPRESSION: 1. No PE. 2. Stable postsurgical changes as detailed above with a persistent small but stable left-sided pleural effusion.  Aortic Atherosclerosis (ICD10-I70.0) and Emphysema (ICD10-J43.9).   Electronically Signed   By: Constance Holster M.D.   On: 06/01/2019 19:34   Impression:  She is doing well 2 years following lung cancer resection.  CT scan of the chest shows no evidence of recurrent or metastatic lung cancer.  There is a small but stable left pleural effusion which is unchanged.  There was a tiny 4 mm subpleural  nodule that had been noted on her CT scan of the chest in February 2020.  This was not commented on on her recent CT scan but is present and unchanged.  I reviewed the CT images with her and answered her questions.  Plan:  I will see her back in 6 months with a CT scan of the chest without contrast for lung cancer surveillance.   I spent 15 minutes performing this established patient evaluation and > 50% of this time was spent face to face counseling and coordinating the surveillance of her resected lung cancer.    Gaye Pollack, MD [facility] [phone]

## 2019-07-03 DIAGNOSIS — E538 Deficiency of other specified B group vitamins: Secondary | ICD-10-CM | POA: Diagnosis not present

## 2019-07-03 DIAGNOSIS — Z23 Encounter for immunization: Secondary | ICD-10-CM | POA: Diagnosis not present

## 2019-07-03 DIAGNOSIS — L923 Foreign body granuloma of the skin and subcutaneous tissue: Secondary | ICD-10-CM | POA: Diagnosis not present

## 2019-07-03 DIAGNOSIS — S90859A Superficial foreign body, unspecified foot, initial encounter: Secondary | ICD-10-CM | POA: Diagnosis not present

## 2019-07-08 ENCOUNTER — Ambulatory Visit (INDEPENDENT_AMBULATORY_CARE_PROVIDER_SITE_OTHER): Payer: Medicare Other | Admitting: Internal Medicine

## 2019-07-08 ENCOUNTER — Encounter: Payer: Self-pay | Admitting: Internal Medicine

## 2019-07-08 ENCOUNTER — Other Ambulatory Visit: Payer: Self-pay

## 2019-07-08 VITALS — BP 132/86 | HR 68 | Ht 63.0 in | Wt 151.0 lb

## 2019-07-08 DIAGNOSIS — I480 Paroxysmal atrial fibrillation: Secondary | ICD-10-CM | POA: Diagnosis not present

## 2019-07-08 DIAGNOSIS — I483 Typical atrial flutter: Secondary | ICD-10-CM | POA: Diagnosis not present

## 2019-07-08 DIAGNOSIS — R001 Bradycardia, unspecified: Secondary | ICD-10-CM | POA: Diagnosis not present

## 2019-07-08 DIAGNOSIS — I2510 Atherosclerotic heart disease of native coronary artery without angina pectoris: Secondary | ICD-10-CM

## 2019-07-08 MED ORDER — ROSUVASTATIN CALCIUM 10 MG PO TABS: 10.0000 mg | ORAL_TABLET | Freq: Every day | ORAL | 3 refills | Status: DC

## 2019-07-08 MED ORDER — ROSUVASTATIN CALCIUM 10 MG PO TABS: 10.0000 mg | ORAL_TABLET | ORAL | 3 refills | Status: DC

## 2019-07-08 NOTE — Patient Instructions (Signed)
Medication Instructions:  DECREASE CRESTOR to twice weekly  Labwork: None  Testing/Procedures: None  Follow-Up: Your provider wants you to follow-up in: 1 year with Dr. Caryl Comes. You will receive a reminder letter in the mail two months in advance. If you don't receive a letter, please call our office to schedule the follow-up appointment.    Any Other Special Instructions Will Be Listed Below (If Applicable).     If you need a refill on your cardiac medications before your next appointment, please call your pharmacy.
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
## 2019-07-10 DIAGNOSIS — Z9049 Acquired absence of other specified parts of digestive tract: Secondary | ICD-10-CM | POA: Diagnosis not present

## 2019-07-10 DIAGNOSIS — E538 Deficiency of other specified B group vitamins: Secondary | ICD-10-CM | POA: Diagnosis not present

## 2019-07-10 DIAGNOSIS — R109 Unspecified abdominal pain: Secondary | ICD-10-CM | POA: Diagnosis not present

## 2019-07-10 DIAGNOSIS — Z8719 Personal history of other diseases of the digestive system: Secondary | ICD-10-CM | POA: Diagnosis not present

## 2019-07-10 DIAGNOSIS — Z9889 Other specified postprocedural states: Secondary | ICD-10-CM | POA: Diagnosis not present

## 2019-07-10 DIAGNOSIS — R1084 Generalized abdominal pain: Secondary | ICD-10-CM | POA: Diagnosis not present

## 2019-07-14 ENCOUNTER — Ambulatory Visit (INDEPENDENT_AMBULATORY_CARE_PROVIDER_SITE_OTHER): Payer: Medicare Other | Admitting: Cardiovascular Disease

## 2019-07-14 ENCOUNTER — Other Ambulatory Visit: Payer: Self-pay

## 2019-07-14 ENCOUNTER — Ambulatory Visit (INDEPENDENT_AMBULATORY_CARE_PROVIDER_SITE_OTHER): Payer: Medicare Other | Admitting: *Deleted

## 2019-07-14 VITALS — BP 110/66 | HR 59 | Temp 97.2°F | Ht 63.0 in | Wt 151.8 lb

## 2019-07-14 DIAGNOSIS — C9110 Chronic lymphocytic leukemia of B-cell type not having achieved remission: Secondary | ICD-10-CM

## 2019-07-14 DIAGNOSIS — I252 Old myocardial infarction: Secondary | ICD-10-CM | POA: Diagnosis not present

## 2019-07-14 DIAGNOSIS — E785 Hyperlipidemia, unspecified: Secondary | ICD-10-CM | POA: Diagnosis not present

## 2019-07-14 DIAGNOSIS — J449 Chronic obstructive pulmonary disease, unspecified: Secondary | ICD-10-CM

## 2019-07-14 DIAGNOSIS — Z5181 Encounter for therapeutic drug level monitoring: Secondary | ICD-10-CM | POA: Diagnosis not present

## 2019-07-14 DIAGNOSIS — I480 Paroxysmal atrial fibrillation: Secondary | ICD-10-CM

## 2019-07-14 DIAGNOSIS — I10 Essential (primary) hypertension: Secondary | ICD-10-CM | POA: Diagnosis not present

## 2019-07-14 DIAGNOSIS — G4733 Obstructive sleep apnea (adult) (pediatric): Secondary | ICD-10-CM

## 2019-07-14 DIAGNOSIS — I2510 Atherosclerotic heart disease of native coronary artery without angina pectoris: Secondary | ICD-10-CM

## 2019-07-14 DIAGNOSIS — Z7901 Long term (current) use of anticoagulants: Secondary | ICD-10-CM

## 2019-07-14 LAB — POCT INR: INR: 2.4 (ref 2.0–3.0)

## 2019-07-14 MED ORDER — EZETIMIBE 10 MG PO TABS: ORAL_TABLET | ORAL | 3 refills | Status: DC
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
## 2019-07-14 NOTE — Patient Instructions (Signed)
Description   Continue taking 1 tablet daily except 1/2 tablet on Sundays, Tuesdays, and Thursdays. Continue stable serving of greens.Repeat INR in 4 weeks.

## 2019-07-14 NOTE — Patient Instructions (Signed)
Medication Instructions:  The current medical regimen is effective;  continue present plan and medications.  If you need a refill on your cardiac medications before your next appointment, please call your pharmacy.   Lab work: CMET, LIPID  Attached are the lab orders that are needed before your upcoming appointment, please come in 3 months anytime to have your labs drawn.   They are fasting labs, so nothing to eat or drink after midnight.  Lab hours: 8:00-4:00 lunch hours 12:45-1:45   If you have labs (blood work) drawn today and your tests are completely normal, you will receive your results only by: [name] MyChart Message (if you have MyChart) OR . A paper copy in the mail If you have any lab test that is abnormal or we need to change your treatment, we will call you to review the results.  Follow-Up: At [facility], you and your health needs are our priority.  As part of our continuing mission to provide you with exceptional heart care, we have created designated Provider Care Teams.  These Care Teams include your primary Cardiologist (physician) and Advanced Practice Providers (APPs -  Physician Assistants and Nurse Practitioners) who all work together to provide you with the care you need, when you need it. You will need a follow up appointment in 6 months.  Please call our office 2 months in advance to schedule this appointment.  You may see Shelva Majestic, MD or one of the following Advanced Practice Providers on your designated Care Team: [place], [place] . Fabian Sharp, PA-C

## 2019-07-15 ENCOUNTER — Other Ambulatory Visit: Payer: Self-pay | Admitting: Internal Medicine

## 2019-07-15 MED ORDER — METOPROLOL SUCCINATE ER 25 MG PO TB24: 25.0000 mg | ORAL_TABLET | Freq: Every day | ORAL | 11 refills | Status: DC

## 2019-07-15 NOTE — Telephone Encounter (Signed)
Pt's medication was sent to pt's pharmacy as requested. Confirmation received.  °

## 2019-07-16 ENCOUNTER — Encounter: Payer: Self-pay | Admitting: Cardiovascular Disease

## 2019-07-16 DIAGNOSIS — E538 Deficiency of other specified B group vitamins: Secondary | ICD-10-CM | POA: Diagnosis not present

## 2019-07-17 ENCOUNTER — Telehealth: Payer: Self-pay | Admitting: Cardiovascular Disease

## 2019-07-17 MED ORDER — PRALUENT 75 MG/ML ~~LOC~~ SOAJ: 75.0000 mg | SUBCUTANEOUS | 6 refills | Status: DC

## 2019-07-17 NOTE — Telephone Encounter (Signed)
Pt called and stated that they do wanna try the praluent. I sent the rx and gave them instructions for healthwell and I will mail out pt assistance forms as well.

## 2019-07-17 NOTE — Telephone Encounter (Signed)
Any advice? Thank you!

## 2019-07-17 NOTE — Telephone Encounter (Signed)
Spoke with pt and she is interested in taking something to lower LDL [name] in past checked on [employer] and pt was told she was covered but per pt would be in the donut hole quicker and med would be expensive Will forward to [place] to see if there is some sort of assistance program or if there is another med that would help./cy

## 2019-07-17 NOTE — Telephone Encounter (Signed)
New Message:   Please call, concerning a new medicine that  Dr Claiborne Billings wants her to start on.

## 2019-07-22 DIAGNOSIS — C9110 Chronic lymphocytic leukemia of B-cell type not having achieved remission: Secondary | ICD-10-CM | POA: Insufficient documentation

## 2019-07-23 DIAGNOSIS — C9110 Chronic lymphocytic leukemia of B-cell type not having achieved remission: Secondary | ICD-10-CM | POA: Diagnosis not present

## 2019-07-23 DIAGNOSIS — I10 Essential (primary) hypertension: Secondary | ICD-10-CM | POA: Diagnosis not present

## 2019-07-23 DIAGNOSIS — L820 Inflamed seborrheic keratosis: Secondary | ICD-10-CM | POA: Diagnosis not present

## 2019-07-23 DIAGNOSIS — L821 Other seborrheic keratosis: Secondary | ICD-10-CM | POA: Diagnosis not present

## 2019-07-23 DIAGNOSIS — Z87891 Personal history of nicotine dependence: Secondary | ICD-10-CM | POA: Diagnosis not present

## 2019-07-23 DIAGNOSIS — R5383 Other fatigue: Secondary | ICD-10-CM | POA: Diagnosis not present

## 2019-07-23 DIAGNOSIS — D485 Neoplasm of uncertain behavior of skin: Secondary | ICD-10-CM | POA: Diagnosis not present

## 2019-07-23 DIAGNOSIS — R5381 Other malaise: Secondary | ICD-10-CM | POA: Diagnosis not present

## 2019-07-23 DIAGNOSIS — E538 Deficiency of other specified B group vitamins: Secondary | ICD-10-CM | POA: Diagnosis not present

## 2019-07-24 ENCOUNTER — Telehealth: Payer: Self-pay

## 2019-07-24 NOTE — Telephone Encounter (Signed)
Pt hasn't started it yet and after talking to pcp thinks risk outweighs benefits will remove from spreadsheet.

## 2019-07-24 NOTE — Telephone Encounter (Signed)
Called and lmomed the patient regarding calling us back to let us know if they have filled out the pt assistance forms or contacted healthwell... awaiting a callback from patient

## 2019-08-01 DIAGNOSIS — R194 Change in bowel habit: Secondary | ICD-10-CM | POA: Diagnosis not present

## 2019-08-06 ENCOUNTER — Telehealth: Payer: Self-pay | Admitting: Cardiovascular Disease

## 2019-08-06 DIAGNOSIS — Z23 Encounter for immunization: Secondary | ICD-10-CM | POA: Diagnosis not present

## 2019-08-06 NOTE — Telephone Encounter (Signed)
   [facility] Pre-operative Risk Assessment    Request for surgical clearance:  1. What type of surgery is being performed? Colonoscopy    2. When is this surgery scheduled? 09/15/2019   3. What type of clearance is required (medical clearance vs. Pharmacy clearance to hold med vs. Both)? Both   4. Are there any medications that need to be held prior to surgery and how long? Warfarin   5. Practice name and name of physician performing surgery?  Dr. Michail Sermon @ [facility]   6. What is your office phone number [phone]    7.   What is your office fax number [phone]  8.   Anesthesia type (None, local, MAC, general) ? Propofol    Sheral Apley M 08/06/2019, 10:17 AM  _________________________________________________________________   (provider comments below)

## 2019-08-06 NOTE — Telephone Encounter (Signed)
Clinical pharmacist to review coumadin 

## 2019-08-07 NOTE — Telephone Encounter (Signed)
   Primary Cardiologist: Shelva Majestic, MD  Chart reviewed as part of pre-operative protocol coverage. Patient was contacted 08/07/2019 in reference to pre-operative risk assessment for pending surgery as outlined below.  [name] was last seen on 07/14/19 by Dr. Claiborne Billings.  Since that day, [name] has done well.  She can complete more than 4.0 METS without anginal complaints. She walks at least 1 mile per day.   Per our clinical pharmacist: Patient with diagnosis of afib on warfarin for anticoagulation.    Procedure: Colonoscopy Date of procedure: 09/15/2019  CHADS2-VASc score of  6 (CHF, HTN, AGE,  CAD, AGE, female)  Per office protocol, patient can hold warfarin for 5 days prior to procedure.    Patient will NOT need lovenox bridge  Therefore, based on ACC/AHA guidelines, the patient would be at acceptable risk for the planned procedure without further cardiovascular testing.   I will route this recommendation to the requesting party via Epic fax function and remove from pre-op pool.  Please call with questions.  Ledora Bottcher, PA 08/07/2019, 5:53 PM

## 2019-08-07 NOTE — Telephone Encounter (Addendum)
Patient with diagnosis of afib on warfarin for anticoagulation.    Procedure: Colonoscopy  Date of procedure: 09/15/2019  CHADS2-VASc score of  6 (CHF, HTN, AGE,  CAD, AGE, female)  Per office protocol, patient can hold warfarin for 5 days prior to procedure.    Patient will NOT need lovenox bridge

## 2019-08-08 ENCOUNTER — Other Ambulatory Visit: Payer: Self-pay | Admitting: Cardiovascular Disease

## 2019-08-08 MED ORDER — ISOSORBIDE MONONITRATE ER 30 MG PO TB24: ORAL_TABLET | ORAL | 3 refills | Status: DC

## 2019-08-08 MED ORDER — ISOSORBIDE MONONITRATE ER 30 MG PO TB24: 30.0000 mg | ORAL_TABLET | Freq: Two times a day (BID) | ORAL | 3 refills | Status: DC

## 2019-08-08 NOTE — Telephone Encounter (Signed)
Called patient to inform her that she would need to hold her Warfarin 5 days prior to her colonoscopy scheduled for 09/15/19. Patient stated that she has some concerns about being sedated and going into Afib. She says that even with light sedation like she had with her cataract surgery in 2019 she went into Afib. She would like for someone to call her to address her concerns.

## 2019-08-08 NOTE — Telephone Encounter (Signed)
I spent 35 min on the phone with [name]. All questions addressed.

## 2019-08-08 NOTE — Telephone Encounter (Signed)
Rx(s) sent to pharmacy electronically.  

## 2019-08-08 NOTE — Telephone Encounter (Signed)
New message   *STAT* If patient is at the pharmacy, call can be transferred to refill team.   1. Which medications need to be refilled? (please list name of each medication and dose if known) isosorbide mononitrate (IMDUR) 30 MG 24 hr tablet  2. Which pharmacy/location (including street and city if local pharmacy) is medication to be sent to?[facility] [place], [place]  3. Do they need a 30 day or 90 day supply? [place]

## 2019-08-08 NOTE — Telephone Encounter (Signed)
Pt's medication was sent to pt's pharmacy as requested. Confirmation received.  °

## 2019-08-11 ENCOUNTER — Other Ambulatory Visit: Payer: Self-pay

## 2019-08-11 ENCOUNTER — Ambulatory Visit (INDEPENDENT_AMBULATORY_CARE_PROVIDER_SITE_OTHER): Payer: Medicare Other | Admitting: Pharmacist Clinician (PhC)/ Clinical Pharmacy Specialist

## 2019-08-11 DIAGNOSIS — Z5181 Encounter for therapeutic drug level monitoring: Secondary | ICD-10-CM | POA: Diagnosis not present

## 2019-08-11 DIAGNOSIS — I480 Paroxysmal atrial fibrillation: Secondary | ICD-10-CM

## 2019-08-11 DIAGNOSIS — I483 Typical atrial flutter: Secondary | ICD-10-CM | POA: Diagnosis not present

## 2019-08-11 LAB — POCT INR: INR: 1.8 — AB (ref 2.0–3.0)

## 2019-08-11 NOTE — Patient Instructions (Signed)
Take 1.5 tablets today Monday Sept 28, then continue taking 1 tablet daily except 1/2 tablet on Sundays, Tuesdays, and Thursdays. Repeat INR in 4 weeks.

## 2019-08-13 ENCOUNTER — Other Ambulatory Visit: Payer: Self-pay | Admitting: Cardiovascular Disease

## 2019-08-22 DIAGNOSIS — E538 Deficiency of other specified B group vitamins: Secondary | ICD-10-CM | POA: Diagnosis not present

## 2019-08-22 DIAGNOSIS — R739 Hyperglycemia, unspecified: Secondary | ICD-10-CM | POA: Diagnosis not present

## 2019-09-03 ENCOUNTER — Telehealth: Payer: Self-pay | Admitting: Pulmonary Disease

## 2019-09-03 NOTE — Telephone Encounter (Signed)
LMTCB

## 2019-09-04 ENCOUNTER — Other Ambulatory Visit: Payer: Self-pay

## 2019-09-04 ENCOUNTER — Encounter: Payer: Self-pay | Admitting: Pulmonary Disease

## 2019-09-04 ENCOUNTER — Ambulatory Visit (INDEPENDENT_AMBULATORY_CARE_PROVIDER_SITE_OTHER): Payer: Medicare Other | Admitting: Pulmonary Disease

## 2019-09-04 VITALS — BP 134/78 | HR 69 | Ht 63.0 in | Wt 153.2 lb

## 2019-09-04 DIAGNOSIS — G4733 Obstructive sleep apnea (adult) (pediatric): Secondary | ICD-10-CM

## 2019-09-04 DIAGNOSIS — J432 Centrilobular emphysema: Secondary | ICD-10-CM

## 2019-09-04 DIAGNOSIS — I5022 Chronic systolic (congestive) heart failure: Secondary | ICD-10-CM

## 2019-09-04 DIAGNOSIS — Z933 Colostomy status: Secondary | ICD-10-CM

## 2019-09-04 DIAGNOSIS — I25118 Atherosclerotic heart disease of native coronary artery with other forms of angina pectoris: Secondary | ICD-10-CM

## 2019-09-04 DIAGNOSIS — Z87891 Personal history of nicotine dependence: Secondary | ICD-10-CM | POA: Diagnosis not present

## 2019-09-04 NOTE — Patient Instructions (Signed)
Obstructive sleep apnea -Not able to tolerate CPAP -History of TMJ will preclude an oral device  Chronic obstructive pulmonary disease -Continue graded exercises -Continue inhaler use  Call with significant concerns  We will see you in a year

## 2019-09-04 NOTE — Telephone Encounter (Signed)
Called and spoke to patient. Patient stated that she saw Dr. Ander Slade and attempted use of CPAP but has been unable to tolerate it. Patient would like to talk about what alternatives there may be. Patient stated new diagnosis of chronic lymphocytic leukemia and would also like to discuss ways to keep her lungs healthy.  Scheduled patient for OV with Dr. Jenetta Downer. Nothing further needed at this time.
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
## 2019-09-05 ENCOUNTER — Other Ambulatory Visit: Payer: Self-pay | Admitting: Pharmacist Clinician (PhC)/ Clinical Pharmacy Specialist

## 2019-09-05 ENCOUNTER — Other Ambulatory Visit: Payer: Self-pay

## 2019-09-05 MED ORDER — WARFARIN SODIUM 2.5 MG PO TABS: 2.5000 mg | ORAL_TABLET | Freq: Every day | ORAL | 1 refills | Status: DC

## 2019-09-05 NOTE — Telephone Encounter (Signed)
Refill

## 2019-09-08 ENCOUNTER — Ambulatory Visit (INDEPENDENT_AMBULATORY_CARE_PROVIDER_SITE_OTHER): Payer: Medicare Other | Admitting: Pharmacist

## 2019-09-08 ENCOUNTER — Other Ambulatory Visit: Payer: Self-pay

## 2019-09-08 DIAGNOSIS — Z5181 Encounter for therapeutic drug level monitoring: Secondary | ICD-10-CM

## 2019-09-08 DIAGNOSIS — I480 Paroxysmal atrial fibrillation: Secondary | ICD-10-CM

## 2019-09-08 LAB — POCT INR: INR: 2.6 (ref 2.0–3.0)

## 2019-09-10 DIAGNOSIS — Z1159 Encounter for screening for other viral diseases: Secondary | ICD-10-CM | POA: Diagnosis not present

## 2019-09-15 DIAGNOSIS — K5730 Diverticulosis of large intestine without perforation or abscess without bleeding: Secondary | ICD-10-CM | POA: Diagnosis not present

## 2019-09-15 DIAGNOSIS — Z86010 Personal history of colonic polyps: Secondary | ICD-10-CM | POA: Diagnosis not present

## 2019-09-15 DIAGNOSIS — K640 First degree hemorrhoids: Secondary | ICD-10-CM | POA: Diagnosis not present

## 2019-09-17 ENCOUNTER — Telehealth: Payer: Self-pay | Admitting: *Deleted

## 2019-09-17 DIAGNOSIS — D72820 Lymphocytosis (symptomatic): Secondary | ICD-10-CM

## 2019-09-17 NOTE — Telephone Encounter (Signed)
Pt called to see if she will have labs on Friday visit and if the labs needs to be done sooner than md appt.  According to Dr. Janese Banks note she needs CBC/d. It will be done on Friday when she comes 2 pm and then see dr Janese Banks after the labs. Called pt to let her know this and the results of cbc/d will be completed at time of MD visits. Pt is happy with news and will be here Friday 2 pm for lab and see md

## 2019-09-18 ENCOUNTER — Other Ambulatory Visit: Payer: Self-pay

## 2019-09-18 ENCOUNTER — Encounter: Payer: Self-pay | Admitting: Oncology

## 2019-09-18 NOTE — Progress Notes (Signed)
Patient pre screened for office appointment, no questions or concerns today. 

## 2019-09-19 ENCOUNTER — Inpatient Hospital Stay (HOSPITAL_BASED_OUTPATIENT_CLINIC_OR_DEPARTMENT_OTHER): Payer: Medicare Other | Admitting: Oncology

## 2019-09-19 ENCOUNTER — Inpatient Hospital Stay: Payer: Medicare Other | Attending: Oncology

## 2019-09-19 ENCOUNTER — Other Ambulatory Visit: Payer: Self-pay

## 2019-09-19 VITALS — BP 129/75 | HR 65 | Temp 98.5°F | Resp 16 | Wt 154.1 lb

## 2019-09-19 DIAGNOSIS — I739 Peripheral vascular disease, unspecified: Secondary | ICD-10-CM | POA: Insufficient documentation

## 2019-09-19 DIAGNOSIS — M8580 Other specified disorders of bone density and structure, unspecified site: Secondary | ICD-10-CM | POA: Diagnosis not present

## 2019-09-19 DIAGNOSIS — I480 Paroxysmal atrial fibrillation: Secondary | ICD-10-CM | POA: Insufficient documentation

## 2019-09-19 DIAGNOSIS — I2510 Atherosclerotic heart disease of native coronary artery without angina pectoris: Secondary | ICD-10-CM | POA: Diagnosis not present

## 2019-09-19 DIAGNOSIS — E7800 Pure hypercholesterolemia, unspecified: Secondary | ICD-10-CM | POA: Diagnosis not present

## 2019-09-19 DIAGNOSIS — I252 Old myocardial infarction: Secondary | ICD-10-CM | POA: Insufficient documentation

## 2019-09-19 DIAGNOSIS — J449 Chronic obstructive pulmonary disease, unspecified: Secondary | ICD-10-CM | POA: Insufficient documentation

## 2019-09-19 DIAGNOSIS — F17210 Nicotine dependence, cigarettes, uncomplicated: Secondary | ICD-10-CM | POA: Insufficient documentation

## 2019-09-19 DIAGNOSIS — C9110 Chronic lymphocytic leukemia of B-cell type not having achieved remission: Secondary | ICD-10-CM | POA: Insufficient documentation

## 2019-09-19 DIAGNOSIS — Z79899 Other long term (current) drug therapy: Secondary | ICD-10-CM | POA: Diagnosis not present

## 2019-09-19 DIAGNOSIS — Z7901 Long term (current) use of anticoagulants: Secondary | ICD-10-CM | POA: Diagnosis not present

## 2019-09-19 DIAGNOSIS — I10 Essential (primary) hypertension: Secondary | ICD-10-CM | POA: Insufficient documentation

## 2019-09-19 DIAGNOSIS — D72820 Lymphocytosis (symptomatic): Secondary | ICD-10-CM

## 2019-09-19 LAB — CBC WITH DIFFERENTIAL/PLATELET
Abs Immature Granulocytes: 0.05 10*3/uL (ref 0.00–0.07)
Basophils Absolute: 0.1 10*3/uL (ref 0.0–0.1)
Basophils Relative: 0 %
Eosinophils Absolute: 0.1 10*3/uL (ref 0.0–0.5)
Eosinophils Relative: 0 %
HCT: 38.9 % (ref 36.0–46.0)
Hemoglobin: 13.1 g/dL (ref 12.0–15.0)
Immature Granulocytes: 0 %
Lymphocytes Relative: 56 %
Lymphs Abs: 7.7 10*3/uL — ABNORMAL HIGH (ref 0.7–4.0)
MCH: 30.7 pg (ref 26.0–34.0)
MCHC: 33.7 g/dL (ref 30.0–36.0)
MCV: 91.1 fL (ref 80.0–100.0)
Monocytes Absolute: 0.6 10*3/uL (ref 0.1–1.0)
Monocytes Relative: 4 %
Neutro Abs: 5.7 10*3/uL (ref 1.7–7.7)
Neutrophils Relative %: 40 %
Platelets: 222 10*3/uL (ref 150–400)
RBC: 4.27 MIL/uL (ref 3.87–5.11)
RDW: 13 % (ref 11.5–15.5)
Smear Review: NORMAL
WBC Morphology: ABNORMAL
WBC: 14.2 10*3/uL — ABNORMAL HIGH (ref 4.0–10.5)
nRBC: 0 % (ref 0.0–0.2)
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
## 2019-09-24 ENCOUNTER — Telehealth: Payer: Self-pay | Admitting: *Deleted

## 2019-09-24 NOTE — Telephone Encounter (Signed)
Patient called asking that her lab results be released to My CH[facility]

## 2019-09-24 NOTE — Telephone Encounter (Signed)
Called pt and let her know that the results have not been released and I will check with Janese Banks in am. Also she can't see th info that she usually gets when she gets paper print out. I told her that I would print it and mail it to her. I went over her cbc lab results and put that in the same envelope in the mail

## 2019-09-25 ENCOUNTER — Ambulatory Visit: Payer: Medicare Other | Admitting: Pulmonary Disease

## 2019-10-06 ENCOUNTER — Ambulatory Visit (INDEPENDENT_AMBULATORY_CARE_PROVIDER_SITE_OTHER): Payer: Medicare Other | Admitting: Pharmacist Clinician (PhC)/ Clinical Pharmacy Specialist

## 2019-10-06 ENCOUNTER — Other Ambulatory Visit: Payer: Self-pay

## 2019-10-06 DIAGNOSIS — Z5181 Encounter for therapeutic drug level monitoring: Secondary | ICD-10-CM | POA: Diagnosis not present

## 2019-10-06 DIAGNOSIS — I480 Paroxysmal atrial fibrillation: Secondary | ICD-10-CM

## 2019-10-06 LAB — POCT INR: INR: 2 (ref 2.0–3.0)

## 2019-10-27 DIAGNOSIS — K5730 Diverticulosis of large intestine without perforation or abscess without bleeding: Secondary | ICD-10-CM | POA: Diagnosis not present

## 2019-10-27 DIAGNOSIS — K648 Other hemorrhoids: Secondary | ICD-10-CM | POA: Diagnosis not present

## 2019-10-27 DIAGNOSIS — R1032 Left lower quadrant pain: Secondary | ICD-10-CM | POA: Diagnosis not present

## 2019-10-29 LAB — COMPREHENSIVE METABOLIC PANEL
ALT: 19 IU/L (ref 0–32)
AST: 19 IU/L (ref 0–40)
Albumin/Globulin Ratio: 2.3 — ABNORMAL HIGH (ref 1.2–2.2)
Albumin: 4.2 g/dL (ref 3.7–4.7)
Alkaline Phosphatase: 55 IU/L (ref 39–117)
BUN/Creatinine Ratio: 23 (ref 12–28)
BUN: 17 mg/dL (ref 8–27)
Bilirubin Total: 0.4 mg/dL (ref 0.0–1.2)
CO2: 25 mmol/L (ref 20–29)
Calcium: 9 mg/dL (ref 8.7–10.3)
Chloride: 97 mmol/L (ref 96–106)
Creatinine, Ser: 0.74 mg/dL (ref 0.57–1.00)
GFR calc Af Amer: 92 mL/min/{1.73_m2} (ref >59)
GFR calc non Af Amer: 80 mL/min/{1.73_m2} (ref >59)
Globulin, Total: 1.8 g/dL (ref 1.5–4.5)
Glucose: 93 mg/dL (ref 65–99)
Potassium: 4.8 mmol/L (ref 3.5–5.2)
Sodium: 136 mmol/L (ref 134–144)
Total Protein: 6 g/dL (ref 6.0–8.5)

## 2019-10-29 LAB — LIPID PANEL
Chol/HDL Ratio: 2.9 ratio (ref 0.0–4.4)
Cholesterol, Total: 181 mg/dL (ref 100–199)
HDL: 62 mg/dL (ref >39)
LDL Chol Calc (NIH): 104 mg/dL — ABNORMAL HIGH (ref 0–99)
Triglycerides: 81 mg/dL (ref 0–149)
VLDL Cholesterol Cal: 15 mg/dL (ref 5–40)

## 2019-11-11 ENCOUNTER — Telehealth: Payer: Self-pay | Admitting: Pulmonary Disease

## 2019-11-11 DIAGNOSIS — J432 Centrilobular emphysema: Secondary | ICD-10-CM

## 2019-11-11 DIAGNOSIS — R0602 Shortness of breath: Secondary | ICD-10-CM

## 2019-11-11 MED ORDER — INCRUSE ELLIPTA 62.5 MCG/INH IN AEPB: 1.0000 | INHALATION_SPRAY | Freq: Every day | RESPIRATORY_TRACT | 11 refills | Status: DC

## 2019-11-11 NOTE — Telephone Encounter (Signed)
Patient last seen 09/04/19 by Dr. Ander Slade. Prescription sent to pharmacy. Nothing further needed at this time.

## 2019-11-13 ENCOUNTER — Telehealth: Payer: Self-pay | Admitting: Internal Medicine

## 2019-11-13 NOTE — Telephone Encounter (Signed)
*  STAT* If patient is at the pharmacy, call can be transferred to refill team.   1. Which medications need to be refilled? (please list name of each medication and dose if known) rosuvastatin (CRESTOR) 10 MG tablet [[id number]]    2. Which pharmacy/location (including street and city if local pharmacy) is medication to be sent to?[id number] [place], [place]  [address] [place], [place] [address]   3. Do they need a 30 day or 90 day supply? 30 day supply

## 2019-11-13 NOTE — Telephone Encounter (Signed)
Patient stated that Dr. Claiborne Billings increased Rosuvastatin to every other day. Not Dr. Caryl Comes.

## 2019-11-17 ENCOUNTER — Other Ambulatory Visit: Payer: Self-pay

## 2019-11-17 ENCOUNTER — Ambulatory Visit (INDEPENDENT_AMBULATORY_CARE_PROVIDER_SITE_OTHER): Payer: Medicare Other | Admitting: Pharmacist Clinician (PhC)/ Clinical Pharmacy Specialist

## 2019-11-17 DIAGNOSIS — Z5181 Encounter for therapeutic drug level monitoring: Secondary | ICD-10-CM | POA: Diagnosis not present

## 2019-11-17 DIAGNOSIS — I480 Paroxysmal atrial fibrillation: Secondary | ICD-10-CM

## 2019-11-17 LAB — POCT INR: INR: 3.1 — AB (ref 2.0–3.0)

## 2019-11-17 NOTE — Patient Instructions (Signed)
Take just 1/2 tablet today Monday January 4, then continue taking 1 tablet daily except 1/2 tablet on Sundays, Tuesdays, and Thursdays. Repeat INR in 4 weeks.

## 2019-11-19 ENCOUNTER — Telehealth: Payer: Self-pay | Admitting: Cardiovascular Disease

## 2019-11-19 NOTE — Telephone Encounter (Signed)
Returned call to patient who reports she was not calling about the issue as noted below. She reports she went to pick up ezetimibe Rx and is OK with generic. No further assistance needed

## 2019-11-19 NOTE — Telephone Encounter (Signed)
Pt calling stating that she would like a prescription sent in for Zetia, name brand only. Pt would like a call back concerning this matter. Please address

## 2019-11-24 ENCOUNTER — Telehealth: Payer: Self-pay | Admitting: *Deleted

## 2019-11-24 NOTE — Telephone Encounter (Signed)
Yes to covid vaccine. Allergy to pfizer drugs does not preclude getting covid vaccine. Allergic reactions are possible and cannot be ruled out however. Benefits of vaccine outweigh risks. Ok to continue eye drops

## 2019-11-24 NOTE — Telephone Encounter (Signed)
Call returned to patient and advised of physician response

## 2019-11-24 NOTE — Telephone Encounter (Signed)
Patient called reporting that she is scheduled to get the [place] vaccine tomorrow and she is asking if she is immunocompromised from her CLL and if she should take the pfizer vaccine since she has had anaphylactic reaction to several other pfizer drugs. She is also askingif Dr Janese Banks thinks it is alright to continue with her specially formulated eye drops made from her blood since she has CLL which she gets from [facility]. Please advise

## 2019-12-15 ENCOUNTER — Ambulatory Visit (INDEPENDENT_AMBULATORY_CARE_PROVIDER_SITE_OTHER): Payer: Medicare Other | Admitting: Pharmacist

## 2019-12-15 ENCOUNTER — Other Ambulatory Visit: Payer: Self-pay

## 2019-12-15 DIAGNOSIS — I480 Paroxysmal atrial fibrillation: Secondary | ICD-10-CM

## 2019-12-15 DIAGNOSIS — Z5181 Encounter for therapeutic drug level monitoring: Secondary | ICD-10-CM | POA: Diagnosis not present

## 2019-12-15 LAB — POCT INR: INR: 2 (ref 2.0–3.0)

## 2019-12-22 ENCOUNTER — Other Ambulatory Visit: Payer: Self-pay | Admitting: Cardiovascular Disease

## 2019-12-22 MED ORDER — ROSUVASTATIN CALCIUM 10 MG PO TABS: 10.0000 mg | ORAL_TABLET | Freq: Every day | ORAL | 1 refills | Status: DC

## 2019-12-22 NOTE — Telephone Encounter (Signed)
*  STAT* If patient is at the pharmacy, call can be transferred to refill team.   1. Which medications need to be refilled? (please list name of each medication and dose if known)  rosuvastatin (CRESTOR) 10 MG tablet  2. Which pharmacy/location (including street and city if local pharmacy) is medication to be sent to? [facility] [place], [place]  3. Do they need a 30 day or 90 day supply? 90 day   Patient is taking medication daily instead of 1 tablet two times a week and is now currently out of medication.

## 2019-12-22 NOTE — Telephone Encounter (Signed)
Rx has been sent to the pharmacy electronically. ° °

## 2020-01-07 ENCOUNTER — Ambulatory Visit: Payer: Medicare Other | Admitting: Surgery

## 2020-01-07 ENCOUNTER — Other Ambulatory Visit: Payer: Self-pay

## 2020-01-07 ENCOUNTER — Other Ambulatory Visit: Payer: Self-pay | Admitting: Surgery

## 2020-01-07 ENCOUNTER — Ambulatory Visit (INDEPENDENT_AMBULATORY_CARE_PROVIDER_SITE_OTHER): Payer: Medicare Other | Admitting: Surgery

## 2020-01-07 ENCOUNTER — Encounter: Payer: Self-pay | Admitting: Surgery

## 2020-01-07 ENCOUNTER — Ambulatory Visit
Admission: RE | Admit: 2020-01-07 | Discharge: 2020-01-07 | Disposition: A | Discharge status: Home / Self Care | Payer: Medicare Other | Source: Ambulatory Visit | Attending: Surgery | Admitting: Surgery

## 2020-01-07 VITALS — BP 134/76 | HR 70 | Temp 97.7°F | Resp 20 | Ht 63.0 in | Wt 156.0 lb

## 2020-01-07 DIAGNOSIS — Z85118 Personal history of other malignant neoplasm of bronchus and lung: Secondary | ICD-10-CM

## 2020-01-07 DIAGNOSIS — R590 Localized enlarged lymph nodes: Secondary | ICD-10-CM

## 2020-01-07 DIAGNOSIS — C3490 Malignant neoplasm of unspecified part of unspecified bronchus or lung: Secondary | ICD-10-CM

## 2020-01-08 ENCOUNTER — Ambulatory Visit (INDEPENDENT_AMBULATORY_CARE_PROVIDER_SITE_OTHER): Payer: Medicare Other | Admitting: Pulmonary Disease

## 2020-01-08 ENCOUNTER — Encounter: Payer: Self-pay | Admitting: Pulmonary Disease

## 2020-01-08 ENCOUNTER — Encounter: Payer: Self-pay | Admitting: Surgery

## 2020-01-08 VITALS — BP 114/68 | HR 69 | Ht 63.0 in | Wt 155.2 lb

## 2020-01-08 DIAGNOSIS — R0602 Shortness of breath: Secondary | ICD-10-CM

## 2020-01-08 DIAGNOSIS — Z85118 Personal history of other malignant neoplasm of bronchus and lung: Secondary | ICD-10-CM

## 2020-01-08 DIAGNOSIS — Z9889 Other specified postprocedural states: Secondary | ICD-10-CM | POA: Diagnosis not present

## 2020-01-08 DIAGNOSIS — J432 Centrilobular emphysema: Secondary | ICD-10-CM

## 2020-01-08 DIAGNOSIS — Z902 Acquired absence of lung [part of]: Secondary | ICD-10-CM | POA: Diagnosis not present

## 2020-01-08 MED ORDER — INCRUSE ELLIPTA 62.5 MCG/INH IN AEPB: 1.0000 | INHALATION_SPRAY | Freq: Every day | RESPIRATORY_TRACT | 11 refills | Status: DC
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
## 2020-01-08 NOTE — Progress Notes (Signed)
HPI:  The patient returns for routine surveillance follow-up having undergone left muscle-sparing thoracotomy and left lower lobectomy on 07/02/2017 for a T1, N0, N0 squamous cell carcinoma of the lung. She had lymphovascular invasion but negative resection margins. She has CLL but it is early stage and does not require any treatment.  This is followed by Dr. Janese Banks with medical oncology. She has also been diagnosed with B12 deficiency and is getting injections and oral replacement for that. Since I last saw her she said that she has been feeling well.  She she has some chronic left-sided chest pain since surgery.  She also has chronic shortness of breath due to COPD and subsequent lobectomy and has been followed by pulmonary medicine.  She denies any headaches or visual changes.  She has had no cough or hemoptysis.  Current Outpatient Medications  Medication Sig Dispense Refill  . acetaminophen (TYLENOL) 500 MG tablet Take 500 mg by mouth every 8 (eight) hours as needed for mild pain or moderate pain.    . Calcium Carb-Cholecalciferol (CALTRATE 600+D) 600-800 MG-UNIT TABS Take 1 tablet by mouth daily.     . Cholecalciferol (VITAMIN D-3) 5000 UNITS TABS Take 5,000 Units by mouth daily.     . Coenzyme Q10 (CO Q-10 PO) Take by mouth daily.    [name] ezetimibe (ZETIA) 10 MG tablet TAKE 1 TABLET(10 MG) BY MOUTH EVERY EVENING (Patient not taking: Reported on 01/08/2020) 90 tablet 3  . hydrocortisone valerate cream (WESTCORT) 0.2 % Apply 1 application topically 3 (three) times a week. On random days - for eczema in ear    . isosorbide mononitrate (IMDUR) 30 MG 24 hr tablet Take 1 tablet (30 mg total) by mouth 2 (two) times daily. 180 tablet 3  . lisinopril (ZESTRIL) 20 MG tablet TAKE 1 TABLET(20 MG) BY MOUTH TWICE DAILY 180 tablet 1  . metoprolol succinate (TOPROL-XL) 25 MG 24 hr tablet Take 1 tablet (25 mg total) by mouth daily. 30 tablet 11  . nitroGLYCERIN (NITROSTAT) 0.4 MG SL tablet Place 1 tablet (0.4  mg total) under the tongue every 5 (five) minutes x 3 doses as needed for chest pain. 25 tablet 1  . NON FORMULARY Place 1 drop into both eyes 4 (four) times daily.    [name] pyridOXINE (VITAMIN B-6) 100 MG tablet Take 100 mg by mouth daily.    . rosuvastatin (CRESTOR) 10 MG tablet Take 1 tablet (10 mg total) by mouth daily. 90 tablet 1  . umeclidinium bromide (INCRUSE ELLIPTA) 62.5 MCG/INH AEPB Inhale 1 puff into the lungs daily. 30 each 11  . valACYclovir (VALTREX) 500 MG tablet Take 500 mg by mouth daily as needed. For cold sores    . vitamin B-12 (CYANOCOBALAMIN) 500 MCG tablet 500 mcg daily. Subligual    . warfarin (COUMADIN) 2.5 MG tablet Take 1 tablet (2.5 mg total) by mouth daily. Takes 1 tablet daily except 1/2 tablet on Sundays, Tuesdays and Thursdays. 90 tablet 1   No current facility-administered medications for this visit.     Physical Exam: BP 134/76   Pulse 70   Temp 97.7 F (36.5 C) (Skin)   Resp 20   Ht 5\' 3"  (1.6 m)   Wt 156 lb (70.8 kg)   SpO2 95% Comment: RA  BMI 27.63 kg/m  She looks well. There is no cervical or supraclavicular adenopathy.  I cannot feel any left axillary adenopathy although she said this area has been tender recently. Cardiac exam shows a  regular rate and rhythm with normal heart sounds. Lung exam is clear. The left chest scar looks fine.   Diagnostic Tests:  CLINICAL DATA:  Small cell lung cancer, follow-up.  EXAM: CT CHEST WITHOUT CONTRAST  TECHNIQUE: Multidetector CT imaging of the chest was performed following the standard protocol without IV contrast.  COMPARISON:  CT angiography of the chest from 06/01/2019  FINDINGS: Cardiovascular: Calcified atherosclerotic changes throughout the thoracic aorta. No sign of aneurysm in the chest. Post CABG. No pericardial effusion. Heart size stable and normal. Central pulmonary vasculature is normal caliber. Vascular structures with limited assessment due to lack of intravenous  contrast.  Mediastinum/Nodes: Thoracic inlet structures are normal. No signs of mediastinal lymphadenopathy. No signs of hilar lymphadenopathy.  Enlarging lymph nodes in the left axilla largest 1.3 cm short axis. This lymph node does not appear to have a discrete fatty hilum. Other lymph nodes show similar enlargement but retain a fatty hilum.  Lungs/Pleura: Signs of pulmonary emphysema similar to previous exams  Small amount of basilar pleural fluid in the left chest is similar following previous left lower lobectomy. Airways are patent.  Upper Abdomen: No acute findings in the upper abdomen.  Musculoskeletal: Signs of median sternotomy. No sign of destructive bone process or acute bone finding.  IMPRESSION: 1. Enlarging lymph nodes in the left axilla, largest measuring 1.3 cm short axis. This lymph node does not appear to have a discrete fatty hilum and is concerning for metastatic adenopathy. PET evaluation may be helpful to select lesion for biopsy. Alternatively focused ultrasound could be performed of this area for further assessment. Reactive nodal changes are also considered though there is no explanation for reactive changes on the current scan. 2. Stable changes of left lower lobectomy with small amount of basilar pleural fluid. 3. No other acute findings.  Aortic Atherosclerosis (ICD10-I70.0).  Emphysema (ICD10-J43.9).   Electronically Signed   By: Zetta Bills M.D.   On: 01/07/2020 15:19  Impression:  She is about 2-1/2 years out from resection of an early stage squamous cell carcinoma of the lung.  Her CT scan today shows no evidence of intrathoracic metastases or recurrence.  There is been some enlargement of the left axillary lymph nodes with the most prominent measuring 1.3 cm.  I cannot feel this but she said that it is tender in that area.  I would think this would be an unlikely place for recurrent lung cancer although not impossible.  I think  it would be best to proceed with a PET scan to evaluate this further and look for any other areas of hypermetabolism.  If this lymph node has hypermetabolic activity on PET scan it should be suitable for ultrasound-guided needle biopsy.  I reviewed the CT images with her and answered all of her questions.  Plan:  She will be scheduled for a PET scan and I will see her back after that has been completed to review the results with her and make further plans.  I spent 20 minutes performing this established patient evaluation and > 50% of this time was spent face to face counseling and coordinating the surveillance of this patient's prior lung cancer.    Gaye Pollack, MD [facility] [phone]

## 2020-01-08 NOTE — Patient Instructions (Addendum)
Thank you for visiting Dr. Valeta Harms at [facility]. Today we recommend the following:  Meds ordered this encounter  Medications  . umeclidinium bromide (INCRUSE ELLIPTA) 62.5 MCG/INH AEPB    Sig: Inhale 1 puff into the lungs daily.    Dispense:  30 each    Refill:  11   Return in about 6 months (around 07/07/2020).    Please do your part to reduce the spread of COVID-19.

## 2020-01-13 ENCOUNTER — Ambulatory Visit: Payer: Medicare Other | Admitting: Cardiovascular Disease

## 2020-01-14 ENCOUNTER — Telehealth: Payer: Self-pay

## 2020-01-14 NOTE — Telephone Encounter (Signed)
Spoke with pt to try to reschedule yesterday's canceled appt. Unable to come in tomorrow. Notified that we would try to find the next available day for her. Pt also states she was supposed to have an INR checked yesterday as well. Pt states she will call Erasmo Downer to reschedule this. Will route to coumadin clinic to make aware.

## 2020-01-14 NOTE — Telephone Encounter (Signed)
lmomed to reschedule appt

## 2020-01-16 ENCOUNTER — Encounter (HOSPITAL_COMMUNITY): Payer: Medicare Other

## 2020-01-20 ENCOUNTER — Other Ambulatory Visit: Payer: Self-pay | Admitting: Cardiovascular Disease

## 2020-01-20 NOTE — Telephone Encounter (Signed)
*  STAT* If patient is at the pharmacy, call can be transferred to refill team.   1. Which medications need to be refilled? (please list name of each medication and dose if known)  metoprolol succinate (TOPROL-XL) 25 MG 24 hr tablet  2. Which pharmacy/location (including street and city if local pharmacy) is medication to be sent to?  [facility] [place], [place]  3. Do they need a 30 day or 90 day supply? 30 with refills   Pt states she had plenty of extra metoprolol from previous rx, so she did  not fill the initial rx written by Dr. Caryl Comes on 07-15-19. She will call the pharmacy to make sure that rx is still active, but just in case the pharmacy asks she may need a new rx

## 2020-01-21 ENCOUNTER — Ambulatory Visit (INDEPENDENT_AMBULATORY_CARE_PROVIDER_SITE_OTHER): Payer: Medicare Other | Admitting: Pharmacist

## 2020-01-21 ENCOUNTER — Other Ambulatory Visit: Payer: Self-pay

## 2020-01-21 ENCOUNTER — Ambulatory Visit: Payer: Medicare Other | Admitting: Surgery

## 2020-01-21 ENCOUNTER — Telehealth: Payer: Self-pay | Admitting: Cardiovascular Disease

## 2020-01-21 DIAGNOSIS — Z5181 Encounter for therapeutic drug level monitoring: Secondary | ICD-10-CM

## 2020-01-21 DIAGNOSIS — I480 Paroxysmal atrial fibrillation: Secondary | ICD-10-CM | POA: Diagnosis not present

## 2020-01-21 LAB — POCT INR: INR: 1.5 — AB (ref 2.0–3.0)

## 2020-01-21 NOTE — Telephone Encounter (Signed)
Pt called because she could not see Dr. Claiborne Billings 01-13-20 due to an emergency on his end. She was told her appointment would be rescheduled, but there is nothing on her MyChart. She would like Dr. Evette Georges nurse to call her and let her know when the missed appointment will be rescheduled

## 2020-01-21 NOTE — Telephone Encounter (Signed)
Lm to call back ./cy 

## 2020-01-22 NOTE — Telephone Encounter (Signed)
I spoke to the patient and discussed that her appointment from 3/2 with Dr Claiborne Billings and [facility] that were cancelled due to Dr Claiborne Billings emergency were rescheduled for 4/19.    She asked that the 3/2 appointments show rescheduled and not "No Show".  I told her that we would correct that.

## 2020-01-30 ENCOUNTER — Encounter: Payer: Self-pay | Admitting: Cardiovascular Disease

## 2020-01-30 ENCOUNTER — Encounter: Payer: Medicare Other | Admitting: Pharmacist

## 2020-01-30 ENCOUNTER — Ambulatory Visit (INDEPENDENT_AMBULATORY_CARE_PROVIDER_SITE_OTHER): Payer: Medicare Other | Admitting: Cardiovascular Disease

## 2020-01-30 ENCOUNTER — Other Ambulatory Visit: Payer: Self-pay

## 2020-01-30 DIAGNOSIS — I480 Paroxysmal atrial fibrillation: Secondary | ICD-10-CM

## 2020-01-30 DIAGNOSIS — Z7901 Long term (current) use of anticoagulants: Secondary | ICD-10-CM | POA: Diagnosis not present

## 2020-01-30 DIAGNOSIS — E785 Hyperlipidemia, unspecified: Secondary | ICD-10-CM

## 2020-01-30 DIAGNOSIS — I252 Old myocardial infarction: Secondary | ICD-10-CM | POA: Diagnosis not present

## 2020-01-30 DIAGNOSIS — I2510 Atherosclerotic heart disease of native coronary artery without angina pectoris: Secondary | ICD-10-CM

## 2020-01-30 DIAGNOSIS — C9110 Chronic lymphocytic leukemia of B-cell type not having achieved remission: Secondary | ICD-10-CM

## 2020-01-30 DIAGNOSIS — C3432 Malignant neoplasm of lower lobe, left bronchus or lung: Secondary | ICD-10-CM

## 2020-01-30 NOTE — Patient Instructions (Signed)
Medication Instructions:  CONTINUE WITH CURRENT MEDICATIONS. NO CHANGES. *If you need a refill on your cardiac medications before your next appointment, please call your pharmacy*   Lab Work: FASTING LAB WORK: CBC CMET TSH LIPID  If you have labs (blood work) drawn today and your tests are completely normal, you will receive your results only by: [name] MyChart Message (if you have MyChart) OR . A paper copy in the mail If you have any lab test that is abnormal or we need to change your treatment, we will call you to review the results.  Follow-Up: At [facility], you and your health needs are our priority.  As part of our continuing mission to provide you with exceptional heart care, we have created designated Provider Care Teams.  These Care Teams include your primary Cardiologist (physician) and Advanced Practice Providers (APPs -  Physician Assistants and Nurse Practitioners) who all work together to provide you with the care you need, when you need it.  We recommend signing up for the patient portal called "MyChart".  Sign up information is provided on this After Visit Summary.  MyChart is used to connect with patients for Virtual Visits (Telemedicine).  Patients are able to view lab/test results, encounter notes, upcoming appointments, etc.  Non-urgent messages can be sent to your provider as well.   To learn more about what you can do with MyChart, go to [email or web address].    Your next appointment:   6 month(s)  The format for your next appointment:   In Person  Provider:   Shelva Majestic, MD

## 2020-01-30 NOTE — Progress Notes (Signed)
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 Ellipta and Ventolin as needed for her COPD.  She has normal systolic function on echocardiography with EF 63% by 3D volume.  There was mildly increased RV systolic pressure at 40 mm.  Since she has been off Zetia for 3 months I am recommending laboratory in the fasting state.  Adjustments to her medications will be made.  I will see her in 6 months for reevaluation or sooner as necessary.   Troy Sine, MD, [facility]  02/01/2020 10:20 AM

## 2020-02-01 ENCOUNTER — Encounter: Payer: Self-pay | Admitting: Cardiovascular Disease

## 2020-02-02 ENCOUNTER — Other Ambulatory Visit: Payer: Self-pay

## 2020-02-02 ENCOUNTER — Encounter (HOSPITAL_COMMUNITY)
Admission: RE | Admit: 2020-02-02 | Discharge: 2020-02-02 | Disposition: A | Discharge status: Home / Self Care | Payer: Medicare Other | Source: Ambulatory Visit | Attending: Surgery | Admitting: Surgery

## 2020-02-02 DIAGNOSIS — R590 Localized enlarged lymph nodes: Secondary | ICD-10-CM | POA: Diagnosis not present

## 2020-02-02 LAB — GLUCOSE, CAPILLARY: Glucose-Capillary: 94 mg/dL (ref 70–99)

## 2020-02-02 MED ORDER — FLUDEOXYGLUCOSE F - 18 (FDG) INJECTION
8.2900 | Freq: Once | INTRAVENOUS | Status: AC
  Administered 2020-02-02: 8.29 via INTRAVENOUS

## 2020-02-02 NOTE — Progress Notes (Signed)
This encounter was created in error - please disregard.

## 2020-02-04 ENCOUNTER — Ambulatory Visit (INDEPENDENT_AMBULATORY_CARE_PROVIDER_SITE_OTHER): Payer: Medicare Other | Admitting: Surgery

## 2020-02-04 ENCOUNTER — Ambulatory Visit (INDEPENDENT_AMBULATORY_CARE_PROVIDER_SITE_OTHER): Payer: Medicare Other | Admitting: Pharmacist Clinician (PhC)/ Clinical Pharmacy Specialist

## 2020-02-04 ENCOUNTER — Other Ambulatory Visit: Payer: Self-pay | Admitting: Pharmacist Clinician (PhC)/ Clinical Pharmacy Specialist

## 2020-02-04 ENCOUNTER — Other Ambulatory Visit: Payer: Self-pay

## 2020-02-04 ENCOUNTER — Encounter: Payer: Medicare Other | Admitting: Surgery

## 2020-02-04 ENCOUNTER — Encounter: Payer: Self-pay | Admitting: Surgery

## 2020-02-04 VITALS — BP 114/72 | HR 64 | Temp 96.8°F | Resp 16 | Ht 63.0 in | Wt 153.0 lb

## 2020-02-04 DIAGNOSIS — Z85118 Personal history of other malignant neoplasm of bronchus and lung: Secondary | ICD-10-CM | POA: Diagnosis not present

## 2020-02-04 DIAGNOSIS — Z5181 Encounter for therapeutic drug level monitoring: Secondary | ICD-10-CM | POA: Diagnosis not present

## 2020-02-04 DIAGNOSIS — I2510 Atherosclerotic heart disease of native coronary artery without angina pectoris: Secondary | ICD-10-CM | POA: Diagnosis not present

## 2020-02-04 DIAGNOSIS — I480 Paroxysmal atrial fibrillation: Secondary | ICD-10-CM

## 2020-02-04 DIAGNOSIS — Z902 Acquired absence of lung [part of]: Secondary | ICD-10-CM | POA: Diagnosis not present

## 2020-02-04 DIAGNOSIS — I252 Old myocardial infarction: Secondary | ICD-10-CM

## 2020-02-04 DIAGNOSIS — R590 Localized enlarged lymph nodes: Secondary | ICD-10-CM

## 2020-02-04 LAB — POCT INR: INR: 1.7 — AB (ref 2.0–3.0)

## 2020-02-04 MED ORDER — WARFARIN SODIUM 2.5 MG PO TABS: ORAL_TABLET | ORAL | 1 refills | Status: DC

## 2020-02-04 NOTE — Progress Notes (Signed)
HPI:  The patient returns today for review of her PET scan results for work-up of recently enlarging left axillary lymphadenopathy.  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 She had lymphovascular invasion but negative resection margins.She has CLL but it is early stage and does not require any treatment.  This is followed by Dr. Janese Banks with medical oncology.  She underwent COVID-19 vaccination with the Moderna vaccine in the left arm prior to developing the lymphadenopathy.  She had not noticed any masses but did complain of some discomfort in her left axilla.  She continues to feel well.  Current Outpatient Medications  Medication Sig Dispense Refill  . acetaminophen (TYLENOL) 500 MG tablet Take 500 mg by mouth every 8 (eight) hours as needed for mild pain or moderate pain.    [name] albuterol (VENTOLIN HFA) 108 (90 Base) MCG/ACT inhaler Inhale 1 to 2 puffs as needed before exercise.    . Calcium Carb-Cholecalciferol (CALTRATE 600+D) 600-800 MG-UNIT TABS Take 1 tablet by mouth daily.     . cholecalciferol (VITAMIN D) 25 MCG (1000 UNIT) tablet     . Coenzyme Q10 (CO Q-10 PO) Take by mouth daily.    . Cyanocobalamin (VITAMIN B-12) 1000 MCG SUBL     . docusate sodium (COLACE) 100 MG capsule     . hydrocortisone valerate cream (WESTCORT) 0.2 % Apply 1 application topically 3 (three) times a week. On random days - for eczema in ear    . isosorbide mononitrate (IMDUR) 30 MG 24 hr tablet Take 1 tablet (30 mg total) by mouth 2 (two) times daily. 180 tablet 3  . lisinopril (ZESTRIL) 20 MG tablet TAKE 1 TABLET(20 MG) BY MOUTH TWICE DAILY 180 tablet 1  . metoprolol succinate (TOPROL-XL) 25 MG 24 hr tablet     . nitroGLYCERIN (NITROSTAT) 0.4 MG SL tablet Place 1 tablet (0.4 mg total) under the tongue every 5 (five) minutes x 3 doses as needed for chest pain. 25 tablet 1  . NON FORMULARY Place 1 drop into both  eyes 4 (four) times daily.    [name] pyridOXINE (VITAMIN B-6) 100 MG tablet Take 100 mg by mouth daily.    . rosuvastatin (CRESTOR) 10 MG tablet Take 1 tablet (10 mg total) by mouth daily. 90 tablet 1  . umeclidinium bromide (INCRUSE ELLIPTA) 62.5 MCG/INH AEPB Inhale 1 puff into the lungs daily. 30 each 11  . valACYclovir (VALTREX) 1000 MG tablet     . warfarin (COUMADIN) 2.5 MG tablet Take 1/2 to 1 tablet by mouth daily as directed 90 tablet 1   No current facility-administered medications for this visit.     Physical Exam: BP 114/72 (BP Location: Left Arm, Patient Position: Sitting, Cuff Size: Normal)   Pulse 64   Temp (!) 96.8 F (36 C)   Resp 16   Ht 5\' 3"  (1.6 m)   Wt 153 lb (69.4 kg)   SpO2 97% Comment: RA  BMI 27.10 kg/m    Diagnostic Tests:  CLINICAL DATA:  Subsequent treatment strategy for non-small cell lung carcinoma with LEFT upper lobectomy. Enlarged lymph nodes in the axilla. COVID vaccine in the LEFT arm. Second vaccination 12/22/2019.  EXAM: NUCLEAR MEDICINE PET SKULL BASE TO THIGH  TECHNIQUE: 8.3 mCi F-18 FDG was injected intravenously. Full-ring PET imaging was performed from the skull base to thigh after the radiotracer. CT data was obtained and used for attenuation correction  and anatomic localization.  Fasting blood glucose: 94 mg/dl  COMPARISON:  Or chest CT 01/07/2020  FINDINGS: Mediastinal blood pool activity: SUV max 2.8  Liver activity: SUV max 4.7  NECK: No hypermetabolic lymph nodes in the neck.  Incidental CT findings: none  CHEST:  Mild metabolic activity associated with mildly enlarged LEFT axillary lymph nodes. For example 10 mm node (image 53/4) with SUV max equal 2.2. This node compares to 12 mm on comparison CT from 01/07/2020. A similar node measuring 7 mm short axis compares to 7 mm (image 49/4) with SUV max equal 1.6. Both of these lymph nodes have metabolic activity less than background blood pool activity.  No  hypermetabolic mediastinal lymph nodes. No hypermetabolic RIGHT axillary nodes.  Incidental CT findings:  Review of the lung parenchyma demonstrates volume loss on LEFT consistent with prior surgery.  ABDOMEN/PELVIS: No abnormal hypermetabolic activity within the liver, pancreas, adrenal glands, or spleen. No hypermetabolic lymph nodes in the abdomen or pelvis.  Incidental CT findings: Uterus and adnexa normal.  SKELETON: No focal hypermetabolic activity to suggest skeletal metastasis.  Incidental CT findings: none  IMPRESSION: 1. Mild metabolic activity associated with minimally enlarged LEFT axial lymph nodes. The degree of enlargement appears slightly less than comparison exam several weeks prior. Favor adenopathy related to COVID vaccination rather than metastatic lung cancer or lymphoproliferative disorder. 2. No evidence of lung cancer recurrence or metastasis elsewhere on the skull base to thigh FDG PET scan   Electronically Signed   By: Suzy Bouchard M.D.   On: 02/02/2020 15:26   Impression: The PET scan shows mild metabolic activity associated with the left axillary lymphadenopathy but less than the background blood pool activity.  The previously seen 12 mm lymph node has decreased in size to 10 mm.  The previously seen 7 mm lymph node is unchanged in size.  There is no evidence of lung cancer recurrence or metastasis elsewhere.  Given the time course of this adenopathy developing shortly after COVID-19 vaccination the left arm and the fact that it has mild metabolic activity I agree with radiology that is most likely related to the vaccination rather than metastatic lung cancer or her lymphoproliferative disorder.  I reviewed the PET scan images with her and answered all of her questions.  I recommended that she have a CT scan of the chest in 6 months for lung cancer surveillance.  Plan:  I will see her back in 6 months with a CT scan of the chest.  I  spent 15 minutes performing this established patient evaluation and > 50% of this time was spent face to face counseling and coordinating the care of this patient's aortic aneurysm.    Gaye Pollack, MD [facility] [phone]

## 2020-02-21 ENCOUNTER — Other Ambulatory Visit: Payer: Self-pay | Admitting: Cardiovascular Disease

## 2020-02-25 ENCOUNTER — Ambulatory Visit (INDEPENDENT_AMBULATORY_CARE_PROVIDER_SITE_OTHER): Payer: Medicare Other | Admitting: Pharmacist Clinician (PhC)/ Clinical Pharmacy Specialist

## 2020-02-25 ENCOUNTER — Other Ambulatory Visit: Payer: Self-pay

## 2020-02-25 DIAGNOSIS — Z5181 Encounter for therapeutic drug level monitoring: Secondary | ICD-10-CM

## 2020-02-25 DIAGNOSIS — I480 Paroxysmal atrial fibrillation: Secondary | ICD-10-CM | POA: Diagnosis not present

## 2020-02-25 LAB — POCT INR: INR: 2.8 (ref 2.0–3.0)

## 2020-03-01 ENCOUNTER — Ambulatory Visit: Payer: Medicare Other | Admitting: Cardiovascular Disease

## 2020-03-16 ENCOUNTER — Other Ambulatory Visit: Payer: Self-pay

## 2020-03-16 ENCOUNTER — Inpatient Hospital Stay (HOSPITAL_BASED_OUTPATIENT_CLINIC_OR_DEPARTMENT_OTHER): Payer: Medicare Other | Admitting: Oncology

## 2020-03-16 ENCOUNTER — Telehealth: Payer: Self-pay | Admitting: *Deleted

## 2020-03-16 ENCOUNTER — Encounter: Payer: Self-pay | Admitting: Oncology

## 2020-03-16 ENCOUNTER — Inpatient Hospital Stay: Payer: Medicare Other | Attending: Oncology

## 2020-03-16 VITALS — BP 142/64 | HR 61 | Temp 97.8°F | Resp 20 | Wt 156.0 lb

## 2020-03-16 DIAGNOSIS — I252 Old myocardial infarction: Secondary | ICD-10-CM

## 2020-03-16 DIAGNOSIS — I2510 Atherosclerotic heart disease of native coronary artery without angina pectoris: Secondary | ICD-10-CM | POA: Diagnosis not present

## 2020-03-16 DIAGNOSIS — M79605 Pain in left leg: Secondary | ICD-10-CM | POA: Diagnosis not present

## 2020-03-16 DIAGNOSIS — I70413 Atherosclerosis of autologous vein bypass graft(s) of the extremities with intermittent claudication, bilateral legs: Secondary | ICD-10-CM | POA: Diagnosis not present

## 2020-03-16 DIAGNOSIS — M79604 Pain in right leg: Secondary | ICD-10-CM | POA: Diagnosis not present

## 2020-03-16 DIAGNOSIS — R590 Localized enlarged lymph nodes: Secondary | ICD-10-CM | POA: Insufficient documentation

## 2020-03-16 DIAGNOSIS — I739 Peripheral vascular disease, unspecified: Secondary | ICD-10-CM | POA: Insufficient documentation

## 2020-03-16 DIAGNOSIS — Z85118 Personal history of other malignant neoplasm of bronchus and lung: Secondary | ICD-10-CM | POA: Insufficient documentation

## 2020-03-16 DIAGNOSIS — Z79899 Other long term (current) drug therapy: Secondary | ICD-10-CM | POA: Insufficient documentation

## 2020-03-16 DIAGNOSIS — C9110 Chronic lymphocytic leukemia of B-cell type not having achieved remission: Secondary | ICD-10-CM | POA: Diagnosis not present

## 2020-03-16 LAB — CBC WITH DIFFERENTIAL/PLATELET
Abs Immature Granulocytes: 0.1 10*3/uL — ABNORMAL HIGH (ref 0.00–0.07)
Basophils Absolute: 0.1 10*3/uL (ref 0.0–0.1)
Basophils Relative: 0 %
Eosinophils Absolute: 0.1 10*3/uL (ref 0.0–0.5)
Eosinophils Relative: 0 %
HCT: 40.2 % (ref 36.0–46.0)
Hemoglobin: 13.6 g/dL (ref 12.0–15.0)
Immature Granulocytes: 1 %
Lymphocytes Relative: 59 %
Lymphs Abs: 8.1 10*3/uL — ABNORMAL HIGH (ref 0.7–4.0)
MCH: 31.3 pg (ref 26.0–34.0)
MCHC: 33.8 g/dL (ref 30.0–36.0)
MCV: 92.4 fL (ref 80.0–100.0)
Monocytes Absolute: 0.6 10*3/uL (ref 0.1–1.0)
Monocytes Relative: 5 %
Neutro Abs: 4.8 10*3/uL (ref 1.7–7.7)
Neutrophils Relative %: 35 %
Platelets: 224 10*3/uL (ref 150–400)
RBC: 4.35 MIL/uL (ref 3.87–5.11)
RDW: 13.2 % (ref 11.5–15.5)
WBC: 13.8 10*3/uL — ABNORMAL HIGH (ref 4.0–10.5)
nRBC: 0 % (ref 0.0–0.2)

## 2020-03-16 NOTE — Telephone Encounter (Signed)
I  Called and spoke to pt about who she was wanting for vascular and she states it is Dr. Quay Burow. The staff says that there is Dr. Fletcher Anon from the same office in [place] that he comes to [place] 4 days a week. I called the pt back and she wants to go to [place] to see Dr.berry. I sent referral in for cardiology and in notes said an appt for vascular which was what I was told to do through Dr. Kennon Holter office staff. I told pt that I ddi refe. And the offie will contact her with appt and that was what I was told also

## 2020-03-16 NOTE — Progress Notes (Signed)
Patient here today for follow up regarding CLL.

## 2020-03-19 ENCOUNTER — Telehealth: Payer: Self-pay | Admitting: Pulmonary Disease

## 2020-03-19 MED ORDER — ALBUTEROL SULFATE HFA 108 (90 BASE) MCG/ACT IN AERS: 2.0000 | INHALATION_SPRAY | RESPIRATORY_TRACT | 1 refills | Status: DC | PRN
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
## 2020-03-19 NOTE — Telephone Encounter (Signed)
Rx for albuterol was refilled  Left detailed msg letting the pt know that this was done

## 2020-03-24 ENCOUNTER — Other Ambulatory Visit: Payer: Self-pay

## 2020-03-24 ENCOUNTER — Ambulatory Visit (INDEPENDENT_AMBULATORY_CARE_PROVIDER_SITE_OTHER): Payer: Medicare Other | Admitting: Pharmacist

## 2020-03-24 DIAGNOSIS — I480 Paroxysmal atrial fibrillation: Secondary | ICD-10-CM | POA: Diagnosis not present

## 2020-03-24 DIAGNOSIS — Z5181 Encounter for therapeutic drug level monitoring: Secondary | ICD-10-CM | POA: Diagnosis not present

## 2020-03-24 LAB — POCT INR: INR: 2 (ref 2.0–3.0)

## 2020-03-29 ENCOUNTER — Other Ambulatory Visit: Payer: Self-pay

## 2020-03-29 ENCOUNTER — Encounter: Payer: Self-pay | Admitting: Adult Health

## 2020-03-29 ENCOUNTER — Other Ambulatory Visit (INDEPENDENT_AMBULATORY_CARE_PROVIDER_SITE_OTHER): Payer: Medicare Other

## 2020-03-29 ENCOUNTER — Ambulatory Visit: Payer: Medicare Other | Admitting: Pulmonary Disease

## 2020-03-29 ENCOUNTER — Ambulatory Visit (INDEPENDENT_AMBULATORY_CARE_PROVIDER_SITE_OTHER): Payer: Medicare Other | Admitting: Adult Health

## 2020-03-29 ENCOUNTER — Ambulatory Visit (INDEPENDENT_AMBULATORY_CARE_PROVIDER_SITE_OTHER): Payer: Medicare Other

## 2020-03-29 VITALS — BP 132/70 | HR 62 | Ht 63.0 in | Wt 158.0 lb

## 2020-03-29 DIAGNOSIS — I2510 Atherosclerotic heart disease of native coronary artery without angina pectoris: Secondary | ICD-10-CM

## 2020-03-29 DIAGNOSIS — R0602 Shortness of breath: Secondary | ICD-10-CM

## 2020-03-29 DIAGNOSIS — J432 Centrilobular emphysema: Secondary | ICD-10-CM | POA: Diagnosis not present

## 2020-03-29 DIAGNOSIS — I252 Old myocardial infarction: Secondary | ICD-10-CM | POA: Diagnosis not present

## 2020-03-29 DIAGNOSIS — I5022 Chronic systolic (congestive) heart failure: Secondary | ICD-10-CM

## 2020-03-29 DIAGNOSIS — I272 Pulmonary hypertension, unspecified: Secondary | ICD-10-CM | POA: Diagnosis not present

## 2020-03-29 LAB — BASIC METABOLIC PANEL
BUN: 17 mg/dL (ref 6–23)
CO2: 27 mEq/L (ref 19–32)
Calcium: 9.2 mg/dL (ref 8.4–10.5)
Chloride: 101 mEq/L (ref 96–112)
Creatinine, Ser: 0.71 mg/dL (ref 0.40–1.20)
GFR: 80.09 mL/min (ref >60.00)
Glucose, Bld: 98 mg/dL (ref 70–99)
Potassium: 4.4 mEq/L (ref 3.5–5.1)
Sodium: 137 mEq/L (ref 135–145)

## 2020-03-29 NOTE — Assessment & Plan Note (Signed)
Notable mild to moderate pulmonary hypertension on last 2D echo.  Does not appear to be in volume overload.  Labs and chest x-ray are pending.  O2 saturations are good in the office today.  If symptoms persist may need further evaluation for pulmonary hypertension as a contributing factor for her dyspnea
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
## 2020-03-29 NOTE — Patient Instructions (Signed)
Chest xray and labs .  Continue on INCRUSE 1 puff daily .  Follow up with Dr. Valeta Harms in 3 months and As needed   Follow up with Dr Gwenlyn Found tomorrow as planned.  Please contact office for sooner follow up if symptoms do not improve or worsen or seek emergency care

## 2020-03-29 NOTE — Assessment & Plan Note (Signed)
Appears euvolemic on exam with no evidence of volume overload.  Check labs today.  Continue on current regimen.  Follow-up with cardiology as planned.

## 2020-03-29 NOTE — Assessment & Plan Note (Signed)
COPD-increased symptoms with shortness of breath unclear etiology.  Does not appear to be having exacerbation.  No evidence of volume overload on exam however will check labs with be met and BNP.  Check chest x-ray today. She has having significant leg pain with ambulation suspect she may have some claudication symptoms.  She is encouraged to follow-up with vascular specialist tomorrow as planned  Plan  Patient Instructions  Chest xray and labs .  Continue on INCRUSE 1 puff daily .  Follow up with Dr. Valeta Harms in 3 months and As needed   Follow up with Dr Gwenlyn Found tomorrow as planned.  Please contact office for sooner follow up if symptoms do not improve or worsen or seek emergency care

## 2020-03-30 ENCOUNTER — Encounter: Payer: Self-pay | Admitting: Cardiovascular Disease

## 2020-03-30 ENCOUNTER — Ambulatory Visit (INDEPENDENT_AMBULATORY_CARE_PROVIDER_SITE_OTHER): Payer: Medicare Other | Admitting: Cardiovascular Disease

## 2020-03-30 VITALS — BP 172/90 | HR 61 | Ht 63.0 in | Wt 155.4 lb

## 2020-03-30 DIAGNOSIS — I739 Peripheral vascular disease, unspecified: Secondary | ICD-10-CM | POA: Diagnosis not present

## 2020-03-30 DIAGNOSIS — I252 Old myocardial infarction: Secondary | ICD-10-CM | POA: Diagnosis not present

## 2020-03-30 DIAGNOSIS — I2510 Atherosclerotic heart disease of native coronary artery without angina pectoris: Secondary | ICD-10-CM

## 2020-03-30 DIAGNOSIS — I480 Paroxysmal atrial fibrillation: Secondary | ICD-10-CM | POA: Diagnosis not present

## 2020-03-30 LAB — BRAIN NATRIURETIC PEPTIDE: Pro B Natriuretic peptide (BNP): 129 pg/mL — ABNORMAL HIGH (ref 0.0–100.0)

## 2020-03-30 NOTE — Assessment & Plan Note (Signed)
[name] returns today for follow-up of PAD.  I last saw her 09/07/2017.  At that time her lower extremity arterial Dopplers performed 08/13/2017 revealed a right ABI of 0.84 and a left of 0.72.  There were no obvious focal lesions I recommended conservative therapy.  Over the last several months has had progressive lifestyle limiting claudication left slightly greater than right and 1/2 mile.  We will repeat lower extremity arterial Doppler studies to further evaluate.

## 2020-03-30 NOTE — Progress Notes (Signed)
03/30/2020 [name]   [birth year]  [id number]  Primary Physician [name], MD Primary Cardiologist: Lorretta Harp MD Garret Reddish, [place], [place]  HPI:  [name] is a 74 y.o.  divorced, mother of 2, grandmother to grandchildren referred by Dr. Claiborne Billings for peripheral vascular evaluation because of lifestyle limiting claudication.  I last saw her in the office 09/07/2017.  She has a history of coronary artery disease status post bypass grafting 3 2008. She has COPD with 40 pack years tobacco abuse having quit 10 years ago as well as history of hypertension and hyperlipidemia. She's also had squamous cell carcinoma surgically removed with a VATS by Dr. Caffie Pinto 07/02/17 with some perioperative A. fib. She denies respiratory shortness of breath. She does complain of somewhat limiting claudication. She did have remote Dopplers in 2011 revealed normal ABIs however most recent Dopplers performed 08/13/17 revealed a right ABI of 0.84 and a left ABI of .72. She did have mild to moderate iliac disease.  Since I saw her back several years ago she has developed CLL.  She sees Dr. Claiborne Billings a regular basis.  She is limited somewhat by shortness of breath from her COPD but she also has progressive lifestyle limiting claudication.   Current Meds  Medication Sig  . acetaminophen (TYLENOL) 500 MG tablet Take 500 mg by mouth every 8 (eight) hours as needed for mild pain or moderate pain.  [name] albuterol (VENTOLIN HFA) 108 (90 Base) MCG/ACT inhaler Inhale 2 puffs into the lungs every 4 (four) hours as needed for wheezing or shortness of breath. Inhale 1 to 2 puffs as needed before exercise.  . Calcium Carb-Cholecalciferol (CALTRATE 600+D) 600-800 MG-UNIT TABS Take 1 tablet by mouth daily.   . cholecalciferol (VITAMIN D) 25 MCG (1000 UNIT) tablet   . Coenzyme Q10 (CO Q-10 PO) Take by mouth daily.  . Cyanocobalamin (VITAMIN B-12) 1000 MCG SUBL   . docusate sodium (COLACE) 100 MG capsule   . hydrocortisone  valerate cream (WESTCORT) 0.2 % Apply 1 application topically 3 (three) times a week. On random days - for eczema in ear  . isosorbide mononitrate (IMDUR) 30 MG 24 hr tablet Take 1 tablet (30 mg total) by mouth 2 (two) times daily.  [name] lisinopril (ZESTRIL) 20 MG tablet TAKE 1 TABLET(20 MG) BY MOUTH TWICE DAILY  . metoprolol succinate (TOPROL-XL) 25 MG 24 hr tablet   . nitroGLYCERIN (NITROSTAT) 0.4 MG SL tablet Place 1 tablet (0.4 mg total) under the tongue every 5 (five) minutes x 3 doses as needed for chest pain.  . NON FORMULARY Place 1 drop into both eyes 4 (four) times daily.  [name] pyridOXINE (VITAMIN B-6) 100 MG tablet Take 100 mg by mouth daily.  . rosuvastatin (CRESTOR) 10 MG tablet Take 1 tablet (10 mg total) by mouth daily.  [name] umeclidinium bromide (INCRUSE ELLIPTA) 62.5 MCG/INH AEPB Inhale 1 puff into the lungs daily.  . valACYclovir (VALTREX) 1000 MG tablet   . warfarin (COUMADIN) 2.5 MG tablet Take 1/2 to 1 tablet by mouth daily as directed     Allergies  Allergen Reactions  . Amiodarone Other (See Comments)    angioedema  . Clindamycin/Lincomycin Swelling    TROUBLE SWALLOWING......SEVERE CHEST PAIN  . Doxycycline     blistering  . Sulfa Antibiotics Photosensitivity, Rash and Other (See Comments)    Red, burning rash & paralysis Burning Rash  . Lipitor [Atorvastatin] Other (See Comments)    MYALGIAS LEG PAIN  . Phenergan [  Promethazine Hcl] Other (See Comments)    Nervous Leg / Restless Leg Syndrome  . Reclast [Zoledronic Acid] Other (See Comments)    Flu symptoms - made pt very sick, and had inflammation  In her eye  . Ketorolac Nausea Only    headache  . Promethazine Other (See Comments)    Restless leg syndrome Uncontrolled leg shaking  . Diltiazem Other (See Comments)    Weakness on oral Dilt  . Latex Rash    Social History   Socioeconomic History  . Marital status: Divorced    Spouse name: Not on file  . Number of children: 2  . Years of education: Not on  file  . Highest education level: Not on file  Occupational History  . Occupation: Retired  Tobacco Use  . Smoking status: Former Smoker    Packs/day: 1.00    Years: 40.00    Pack years: 40.00    Types: Cigarettes    Quit date: 09/15/2007    Years since quitting: 12.5  . Smokeless tobacco: Never Used  Substance and Sexual Activity  . Alcohol use: Yes    Alcohol/week: 6.0 standard drinks    Types: 6 Glasses of wine per week  . Drug use: No  . Sexual activity: Never  Other Topics Concern  . Not on file  Social History Narrative   She lives in [place], [place]. Her daughter helps with her care.    Social Determinants of Health   Financial Resource Strain:   . Difficulty of Paying Living Expenses:   Food Insecurity:   . Worried About Charity fundraiser in the Last Year:   . Arboriculturist in the Last Year:   Transportation Needs:   . Film/video editor (Medical):   [name] Lack of Transportation (Non-Medical):   Physical Activity:   . Days of Exercise per Week:   . Minutes of Exercise per Session:   Stress:   . Feeling of Stress :   Social Connections:   . Frequency of Communication with Friends and Family:   . Frequency of Social Gatherings with Friends and Family:   . Attends Religious Services:   . Active Member of Clubs or Organizations:   . Attends Archivist Meetings:   [name] Marital Status:   Intimate Partner Violence:   . Fear of Current or Ex-Partner:   . Emotionally Abused:   [name] Physically Abused:   . Sexually Abused:      Review of Systems: General: negative for chills, fever, night sweats or weight changes.  Cardiovascular: negative for chest pain, dyspnea on exertion, edema, orthopnea, palpitations, paroxysmal nocturnal dyspnea or shortness of breath Dermatological: negative for rash Respiratory: negative for cough or wheezing Urologic: negative for hematuria Abdominal: negative for nausea, vomiting, diarrhea, bright red blood per rectum, melena,  or hematemesis Neurologic: negative for visual changes, syncope, or dizziness All other systems reviewed and are otherwise negative except as noted above.    Blood pressure (!) 172/90, pulse 61, height 5\' 3"  (1.6 m), weight 155 lb 6.4 oz (70.5 kg).  General appearance: alert and no distress Neck: no adenopathy, no carotid bruit, no JVD, supple, symmetrical, trachea midline and thyroid not enlarged, symmetric, no tenderness/mass/nodules Lungs: clear to auscultation bilaterally Heart: regular rate and rhythm, S1, S2 normal, no murmur, click, rub or gallop Extremities: extremities normal, atraumatic, no cyanosis or edema Pulses: 2+ and symmetric Skin: Skin color, texture, turgor normal. No rashes or lesions Neurologic: Alert and oriented X  3, normal strength and tone. Normal symmetric reflexes. Normal coordination and gait  EKG sinus rhythm at 61 with nonspecific ST and T wave changes.  I personally reviewed this EKG.  ASSESSMENT AND PLAN:   Claudication in peripheral vascular disease ([place]) [name] returns today for follow-up of PAD.  I last saw her 09/07/2017.  At that time her lower extremity arterial Dopplers performed 08/13/2017 revealed a right ABI of 0.84 and a left of 0.72.  There were no obvious focal lesions I recommended conservative therapy.  Over the last several months has had progressive lifestyle limiting claudication left slightly greater than right and 1/2 mile.  We will repeat lower extremity arterial Doppler studies to further evaluate.      Lorretta Harp MD FACP,FACC,FAHA, [facility] 03/30/2020 4:47 PM

## 2020-03-30 NOTE — Patient Instructions (Addendum)
Medication Instructions:  Your physician recommends that you continue on your current medications as directed. Please refer to the Current Medication list given to you today.  *If you need a refill on your cardiac medications before your next appointment, please call your pharmacy*  Lab Work: NONE ordered at this time of appointment   If you have labs (blood work) drawn today and your tests are completely normal, you will receive your results only by: [name] MyChart Message (if you have MyChart) OR . A paper copy in the mail If you have any lab test that is abnormal or we need to change your treatment, we will call you to review the results.  Testing/Procedures: Your physician has requested that you have a lower extremity arterial exercise duplex. During this test, exercise and ultrasound are used to evaluate arterial blood flow in the legs. Allow one hour for this exam. There are no restrictions or special instructions.   Please schedule for 1-2 weeks    Your physician has requested that you have an ankle brachial index (ABI). During this test an ultrasound and blood pressure cuff are used to evaluate the arteries that supply the arms and legs with blood. Allow thirty minutes for this exam. There are no restrictions or special instructions.   Please schedule for 1-2 weeks   Follow-Up: At [facility], you and your health needs are our priority.  As part of our continuing mission to provide you with exceptional heart care, we have created designated Provider Care Teams.  These Care Teams include your primary Cardiologist (physician) and Advanced Practice Providers (APPs -  Physician Assistants and Nurse Practitioners) who all work together to provide you with the care you need, when you need it.  We recommend signing up for the patient portal called "MyChart".  Sign up information is provided on this After Visit Summary.  MyChart is used to connect with patients for Virtual Visits  (Telemedicine).  Patients are able to view lab/test results, encounter notes, upcoming appointments, etc.  Non-urgent messages can be sent to your provider as well.   To learn more about what you can do with MyChart, go to [email or web address].    Your next appointment:   3-4 week(s)  The format for your next appointment:   In Person  Provider:   Quay Burow, MD  Other Instructions

## 2020-03-31 ENCOUNTER — Telehealth: Payer: Self-pay | Admitting: Adult Health

## 2020-03-31 NOTE — Progress Notes (Signed)
PCCM: thanks for seeing her Garner Nash, DO [facility] 03/31/2020 6:19 PM

## 2020-03-31 NOTE — Telephone Encounter (Signed)
Called and spoke with pt letting her know that I was going to send TP the info from her and she verbalized understanding.  Routing this to TP as an [place].

## 2020-04-10 ENCOUNTER — Other Ambulatory Visit: Payer: Self-pay | Admitting: Cardiovascular Disease

## 2020-04-21 ENCOUNTER — Encounter (HOSPITAL_COMMUNITY): Payer: Medicare Other

## 2020-04-21 ENCOUNTER — Ambulatory Visit (INDEPENDENT_AMBULATORY_CARE_PROVIDER_SITE_OTHER): Payer: Medicare Other | Admitting: Pharmacist Clinician (PhC)/ Clinical Pharmacy Specialist

## 2020-04-21 ENCOUNTER — Ambulatory Visit (HOSPITAL_COMMUNITY)
Admission: RE | Admit: 2020-04-21 | Discharge: 2020-04-21 | Disposition: A | Discharge status: Home / Self Care | Payer: Medicare Other | Source: Ambulatory Visit | Attending: Internal Medicine | Admitting: Internal Medicine

## 2020-04-21 ENCOUNTER — Other Ambulatory Visit: Payer: Self-pay

## 2020-04-21 DIAGNOSIS — I483 Typical atrial flutter: Secondary | ICD-10-CM

## 2020-04-21 DIAGNOSIS — Z5181 Encounter for therapeutic drug level monitoring: Secondary | ICD-10-CM | POA: Diagnosis not present

## 2020-04-21 DIAGNOSIS — I480 Paroxysmal atrial fibrillation: Secondary | ICD-10-CM

## 2020-04-21 DIAGNOSIS — I739 Peripheral vascular disease, unspecified: Secondary | ICD-10-CM | POA: Diagnosis not present

## 2020-04-21 LAB — POCT INR: INR: 2.8 (ref 2.0–3.0)

## 2020-04-28 LAB — CBC
Hematocrit: 39.1 % (ref 34.0–46.6)
Hemoglobin: 13.2 g/dL (ref 11.1–15.9)
MCH: 31.1 pg (ref 26.6–33.0)
MCHC: 33.8 g/dL (ref 31.5–35.7)
MCV: 92 fL (ref 79–97)
Platelets: 203 10*3/uL (ref 150–450)
RBC: 4.24 x10E6/uL (ref 3.77–5.28)
RDW: 13.1 % (ref 11.7–15.4)
WBC: 13.2 10*3/uL — ABNORMAL HIGH (ref 3.4–10.8)

## 2020-04-28 LAB — COMPREHENSIVE METABOLIC PANEL
ALT: 15 IU/L (ref 0–32)
AST: 21 IU/L (ref 0–40)
Albumin/Globulin Ratio: 2.3 — ABNORMAL HIGH (ref 1.2–2.2)
Albumin: 4.2 g/dL (ref 3.7–4.7)
Alkaline Phosphatase: 53 IU/L (ref 48–121)
BUN/Creatinine Ratio: 17 (ref 12–28)
BUN: 12 mg/dL (ref 8–27)
Bilirubin Total: 0.4 mg/dL (ref 0.0–1.2)
CO2: 25 mmol/L (ref 20–29)
Calcium: 9.2 mg/dL (ref 8.7–10.3)
Chloride: 99 mmol/L (ref 96–106)
Creatinine, Ser: 0.7 mg/dL (ref 0.57–1.00)
GFR calc Af Amer: 98 mL/min/{1.73_m2} (ref >59)
GFR calc non Af Amer: 85 mL/min/{1.73_m2} (ref >59)
Globulin, Total: 1.8 g/dL (ref 1.5–4.5)
Glucose: 92 mg/dL (ref 65–99)
Potassium: 4.7 mmol/L (ref 3.5–5.2)
Sodium: 138 mmol/L (ref 134–144)
Total Protein: 6 g/dL (ref 6.0–8.5)

## 2020-04-28 LAB — LIPID PANEL
Chol/HDL Ratio: 2.5 ratio (ref 0.0–4.4)
Cholesterol, Total: 180 mg/dL (ref 100–199)
HDL: 71 mg/dL (ref >39)
LDL Chol Calc (NIH): 94 mg/dL (ref 0–99)
Triglycerides: 79 mg/dL (ref 0–149)
VLDL Cholesterol Cal: 15 mg/dL (ref 5–40)

## 2020-04-28 LAB — TSH: TSH: 2.04 u[IU]/mL (ref 0.450–4.500)

## 2020-05-04 ENCOUNTER — Encounter: Payer: Self-pay | Admitting: Cardiovascular Disease

## 2020-05-04 ENCOUNTER — Ambulatory Visit (INDEPENDENT_AMBULATORY_CARE_PROVIDER_SITE_OTHER): Payer: Medicare Other | Admitting: Cardiovascular Disease

## 2020-05-04 ENCOUNTER — Other Ambulatory Visit: Payer: Self-pay

## 2020-05-04 VITALS — BP 128/78 | HR 53 | Ht 63.0 in | Wt 157.2 lb

## 2020-05-04 DIAGNOSIS — I252 Old myocardial infarction: Secondary | ICD-10-CM

## 2020-05-04 DIAGNOSIS — I739 Peripheral vascular disease, unspecified: Secondary | ICD-10-CM

## 2020-05-04 DIAGNOSIS — Z01812 Encounter for preprocedural laboratory examination: Secondary | ICD-10-CM | POA: Diagnosis not present

## 2020-05-04 DIAGNOSIS — Z7901 Long term (current) use of anticoagulants: Secondary | ICD-10-CM

## 2020-05-04 DIAGNOSIS — I2510 Atherosclerotic heart disease of native coronary artery without angina pectoris: Secondary | ICD-10-CM

## 2020-05-04 NOTE — Progress Notes (Signed)
05/04/2020 [name]   05-06-1946  [id number]  Primary Physician [name], MD Primary Cardiologist: Lorretta Harp MD Garret Reddish, [place], [place]  HPI:  [name] is a 74 y.o.    divorced, mother of 2, grandmother to grandchildren referred by Dr. Claiborne Billings for peripheral vascular evaluation because of lifestyle limiting claudication.  I last saw her in the office 03/30/2020.  She has a history of coronary artery disease status post bypass grafting 3 2008. She has COPD with 40 pack years tobacco abuse having quit 10 years ago as well as history of hypertension and hyperlipidemia. She's also had squamous cell carcinoma surgically removed with a VATS by Dr. Caffie Pinto 07/02/17 with some perioperative A. fib. She denies respiratory shortness of breath. She does complain of somewhat limiting claudication. She did have remote Dopplers in 2011 revealed normal ABIs however most recent Dopplers performed 08/13/17 revealed a right ABI of 0.84 and a leftABI of .72.She did have mild to moderate iliac disease.   She is limited somewhat by shortness of breath from her COPD but she also has progressive lifestyle limiting claudication.  Dopplers performed 04/22/2020 revealed a right ABI of 1.01 a left of 0.83 with high-frequency signals in both iliac arteries.  She wishes to proceed with angiography and endovascular therapy for lifestyle limiting claudication.   Current Meds  Medication Sig  . acetaminophen (TYLENOL) 500 MG tablet Take 500 mg by mouth every 8 (eight) hours as needed for mild pain or moderate pain.  [name] albuterol (VENTOLIN HFA) 108 (90 Base) MCG/ACT inhaler Inhale 2 puffs into the lungs every 4 (four) hours as needed for wheezing or shortness of breath. Inhale 1 to 2 puffs as needed before exercise.  . Calcium Carb-Cholecalciferol (CALTRATE 600+D) 600-800 MG-UNIT TABS Take 1 tablet by mouth daily.   . cholecalciferol (VITAMIN D) 25 MCG (1000 UNIT) tablet   . Coenzyme Q10 (CO Q-10 PO)  Take by mouth daily.  . Cyanocobalamin (VITAMIN B-12) 1000 MCG SUBL   . docusate sodium (COLACE) 100 MG capsule   . fluorometholone (FML) 0.1 % ophthalmic suspension SMARTSIG:In Eye(s)  . hydrocortisone valerate cream (WESTCORT) 0.2 % Apply 1 application topically 3 (three) times a week. On random days - for eczema in ear  . isosorbide mononitrate (IMDUR) 30 MG 24 hr tablet Take 1 tablet (30 mg total) by mouth 2 (two) times daily.  [name] lisinopril (ZESTRIL) 20 MG tablet TAKE 1 TABLET(20 MG) BY MOUTH TWICE DAILY  . metoprolol succinate (TOPROL-XL) 25 MG 24 hr tablet   . nitroGLYCERIN (NITROSTAT) 0.4 MG SL tablet Place 1 tablet (0.4 mg total) under the tongue every 5 (five) minutes x 3 doses as needed for chest pain.  . NON FORMULARY Place 1 drop into both eyes 4 (four) times daily.  [name] pyridOXINE (VITAMIN B-6) 100 MG tablet Take 100 mg by mouth daily.  . rosuvastatin (CRESTOR) 10 MG tablet TAKE 1 TABLET(10 MG) BY MOUTH DAILY  . umeclidinium bromide (INCRUSE ELLIPTA) 62.5 MCG/INH AEPB Inhale 1 puff into the lungs daily.  . valACYclovir (VALTREX) 1000 MG tablet   . warfarin (COUMADIN) 2.5 MG tablet Take 1/2 to 1 tablet by mouth daily as directed     Allergies  Allergen Reactions  . Amiodarone Other (See Comments)    angioedema  . Clindamycin/Lincomycin Swelling    TROUBLE SWALLOWING......SEVERE CHEST PAIN  . Doxycycline     blistering  . Sulfa Antibiotics Photosensitivity, Rash and Other (See Comments)  Red, burning rash & paralysis Burning Rash  . Lipitor [Atorvastatin] Other (See Comments)    MYALGIAS LEG PAIN  . Phenergan [Promethazine Hcl] Other (See Comments)    Nervous Leg / Restless Leg Syndrome  . Reclast [Zoledronic Acid] Other (See Comments)    Flu symptoms - made pt very sick, and had inflammation  In her eye  . Ketorolac Nausea Only    headache  . Promethazine Other (See Comments)    Restless leg syndrome Uncontrolled leg shaking  . Diltiazem Other (See Comments)     Weakness on oral Dilt  . Latex Rash    Social History   Socioeconomic History  . Marital status: Divorced    Spouse name: Not on file  . Number of children: 2  . Years of education: Not on file  . Highest education level: Not on file  Occupational History  . Occupation: Retired  Tobacco Use  . Smoking status: Former Smoker    Packs/day: 1.00    Years: 40.00    Pack years: 40.00    Types: Cigarettes    Quit date: 09/15/2007    Years since quitting: 12.6  . Smokeless tobacco: Never Used  Vaping Use  . Vaping Use: Never used  Substance and Sexual Activity  . Alcohol use: Yes    Alcohol/week: 6.0 standard drinks    Types: 6 Glasses of wine per week  . Drug use: No  . Sexual activity: Never  Other Topics Concern  . Not on file  Social History Narrative   She lives in [place], [place]. Her daughter helps with her care.    Social Determinants of Health   Financial Resource Strain:   . Difficulty of Paying Living Expenses:   Food Insecurity:   . Worried About Charity fundraiser in the Last Year:   . Arboriculturist in the Last Year:   Transportation Needs:   . Film/video editor (Medical):   [name] Lack of Transportation (Non-Medical):   Physical Activity:   . Days of Exercise per Week:   . Minutes of Exercise per Session:   Stress:   . Feeling of Stress :   Social Connections:   . Frequency of Communication with Friends and Family:   . Frequency of Social Gatherings with Friends and Family:   . Attends Religious Services:   . Active Member of Clubs or Organizations:   . Attends Archivist Meetings:   [name] Marital Status:   Intimate Partner Violence:   . Fear of Current or Ex-Partner:   . Emotionally Abused:   [name] Physically Abused:   . Sexually Abused:      Review of Systems: General: negative for chills, fever, night sweats or weight changes.  Cardiovascular: negative for chest pain, dyspnea on exertion, edema, orthopnea, palpitations, paroxysmal  nocturnal dyspnea or shortness of breath Dermatological: negative for rash Respiratory: negative for cough or wheezing Urologic: negative for hematuria Abdominal: negative for nausea, vomiting, diarrhea, bright red blood per rectum, melena, or hematemesis Neurologic: negative for visual changes, syncope, or dizziness All other systems reviewed and are otherwise negative except as noted above.    Blood pressure 128/78, pulse (!) 53, height 5\' 3"  (1.6 m), weight 157 lb 3.2 oz (71.3 kg), SpO2 99 %.  General appearance: alert and no distress Neck: no adenopathy, no carotid bruit, no JVD, supple, symmetrical, trachea midline and thyroid not enlarged, symmetric, no tenderness/mass/nodules Lungs: clear to auscultation bilaterally Heart: regular rate and rhythm, S1,  S2 normal, no murmur, click, rub or gallop Extremities: extremities normal, atraumatic, no cyanosis or edema Pulses: 2+ and symmetric Skin: Skin color, texture, turgor normal. No rashes or lesions Neurologic: Alert and oriented X 3, normal strength and tone. Normal symmetric reflexes. Normal coordination and gait  EKG not performed today  ASSESSMENT AND PLAN:   Claudication in peripheral vascular disease ([place]) [name] returns a for follow-up of her Doppler studies performed because of known iliac disease and claudication.  She says her claudication is progressively getting worse and is lifestyle limiting.  She had Dopplers performed 04/22/2020 revealing a right ABI of 1.01 on the left 0.83.  She does have bilateral iliac disease by duplex ultrasound.  She wishes to proceed with outpatient peripheral angiography with endovascular therapy.  She is on Coumadin for PAF will need to stop that 4 days prior.      Lorretta Harp MD FACP,FACC,FAHA, [facility] 05/04/2020 4:14 PM

## 2020-05-04 NOTE — Patient Instructions (Addendum)
    [address] [place] [address] [place] [address] Dept: [phone] Loc: [place]  05/04/2020  You are scheduled for a Peripheral Angiogram on Thursday, July 1 with Dr. Quay Burow.  1. Please arrive at the [facility] (Main Entrance A) at [facility]: [address] [place], [place] [address] at 9:30 am (This time is two hours before your procedure to ensure your preparation). Free valet parking service is available.   Special note: Every effort is made to have your procedure done on time. Please understand that emergencies sometimes delay scheduled procedures.  2. Diet: Do not eat solid foods after midnight.  The patient may have clear liquids until 5am upon the day of the procedure.  3. Labs: You will need to have blood drawn on Thursday, June 24 or Friday June 25th at [facility]  Open: 8am - 5pm (Lunch 12:30 - 1:30)   Phone: [phone]. You do not need to be fasting.  4. Medication instructions in preparation for your procedure:   Contrast Allergy: No  Stop taking Coumadin (Warfarin) on Monday, June 28.  LAST DOSE Sunday June 27th  Hold Lisinopril AM of procedure  On the morning of your procedure, take your Aspirin and any morning medicines NOT listed above.  You may use sips of water.  5. Plan for one night stay--bring personal belongings. 6. Bring a current list of your medications and current insurance cards. 7. You MUST have a responsible person to drive you home. 8. Someone MUST be with you the first 24 hours after you arrive home or your discharge will be delayed. 9. Please wear clothes that are easy to get on and off and wear slip-on shoes.  Thank you for allowing [place] to care for you!   -- [facility]    Your physician has requested that you have a lower extremity arterial  duplex 1 week after procedure. This test is an ultrasound of the arteries in the legs. It looks at arterial blood flow in the legs. Allow one hour for Lower Arterial scans. There are no restrictions or special instructions  Follow up with Dr. Gwenlyn Found 2 weeks after procedure

## 2020-05-04 NOTE — Assessment & Plan Note (Signed)
[name] returns a for follow-up of her Doppler studies performed because of known iliac disease and claudication.  She says her claudication is progressively getting worse and is lifestyle limiting.  She had Dopplers performed 04/22/2020 revealing a right ABI of 1.01 on the left 0.83.  She does have bilateral iliac disease by duplex ultrasound.  She wishes to proceed with outpatient peripheral angiography with endovascular therapy.  She is on Coumadin for PAF will need to stop that 4 days prior.

## 2020-05-04 NOTE — H&P (View-Only) (Signed)
05/04/2020 [name]   1946/02/24  [id number]  Primary Physician Bridget Pink, MD Primary Cardiologist: Bridget Harp MD [name], [name], [place]  HPI:  [name] is a 74 y.o.    divorced, mother of 2, grandmother to grandchildren referred by Dr. Claiborne [name] for peripheral vascular evaluation because of lifestyle limiting claudication.  I last saw her in the office 03/30/2020.  She has a history of coronary artery disease status post bypass grafting 3 2008. She has COPD with 40 pack years tobacco abuse having quit 10 years ago as well as history of hypertension and hyperlipidemia. She's also had squamous cell carcinoma surgically removed with a VATS by Dr. Caffie [name] 07/02/17 with some perioperative A. fib. She denies respiratory shortness of breath. She does complain of somewhat limiting claudication. She did have remote Dopplers in 2011 revealed normal ABIs however most recent Dopplers performed 08/13/17 revealed a right ABI of 0.84 and a leftABI of .72.She did have mild to moderate iliac disease.   She is limited somewhat by shortness of breath from her COPD but she also has progressive lifestyle limiting claudication.  Dopplers performed 04/22/2020 revealed a right ABI of 1.01 a left of 0.83 with high-frequency signals in both iliac arteries.  She wishes to proceed with angiography and endovascular therapy for lifestyle limiting claudication.   Current Meds  Medication Sig  . acetaminophen (TYLENOL) 500 MG tablet Take 500 mg by mouth every 8 (eight) hours as needed for mild pain or moderate pain.  [name] albuterol (VENTOLIN HFA) 108 (90 Base) MCG/ACT inhaler Inhale 2 puffs into the lungs every 4 (four) hours as needed for wheezing or shortness of breath. Inhale 1 to 2 puffs as needed before exercise.  . Calcium Carb-Cholecalciferol (CALTRATE 600+D) 600-800 MG-UNIT TABS Take 1 tablet by mouth daily.   . cholecalciferol (VITAMIN D) 25 MCG (1000 UNIT) tablet   . Coenzyme Q10 (CO Q-10 PO)  Take by mouth daily.  . Cyanocobalamin (VITAMIN B-12) 1000 MCG SUBL   . docusate sodium (COLACE) 100 MG capsule   . fluorometholone (FML) 0.1 % ophthalmic suspension SMARTSIG:In Eye(s)  . hydrocortisone valerate cream (WESTCORT) 0.2 % Apply 1 application topically 3 (three) times a week. On random days - for eczema in ear  . isosorbide mononitrate (IMDUR) 30 MG 24 hr tablet Take 1 tablet (30 mg total) by mouth 2 (two) times daily.  [name] lisinopril (ZESTRIL) 20 MG tablet TAKE 1 TABLET(20 MG) BY MOUTH TWICE DAILY  . metoprolol succinate (TOPROL-XL) 25 MG 24 hr tablet   . nitroGLYCERIN (NITROSTAT) 0.4 MG SL tablet Place 1 tablet (0.4 mg total) under the tongue every 5 (five) minutes x 3 doses as needed for chest pain.  . NON FORMULARY Place 1 drop into both eyes 4 (four) times daily.  [name] pyridOXINE (VITAMIN B-6) 100 MG tablet Take 100 mg by mouth daily.  . rosuvastatin (CRESTOR) 10 MG tablet TAKE 1 TABLET(10 MG) BY MOUTH DAILY  . umeclidinium bromide (INCRUSE ELLIPTA) 62.5 MCG/INH AEPB Inhale 1 puff into the lungs daily.  . valACYclovir (VALTREX) 1000 MG tablet   . warfarin (COUMADIN) 2.5 MG tablet Take 1/2 to 1 tablet by mouth daily as directed     Allergies  Allergen Reactions  . Amiodarone Other (See Comments)    angioedema  . Clindamycin/Lincomycin Swelling    TROUBLE SWALLOWING......SEVERE CHEST PAIN  . Doxycycline     blistering  . Sulfa Antibiotics Photosensitivity, Rash and Other (See Comments)  Red, burning rash & paralysis Burning Rash  . Lipitor [Atorvastatin] Other (See Comments)    MYALGIAS LEG PAIN  . Phenergan [Promethazine Hcl] Other (See Comments)    Nervous Leg / Restless Leg Syndrome  . Reclast [Zoledronic Acid] Other (See Comments)    Flu symptoms - made pt very sick, and had inflammation  In her eye  . Ketorolac Nausea Only    headache  . Promethazine Other (See Comments)    Restless leg syndrome Uncontrolled leg shaking  . Diltiazem Other (See Comments)     Weakness on oral Dilt  . Latex Rash    Social History   Socioeconomic History  . Marital status: Divorced    Spouse name: Not on file  . Number of children: 2  . Years of education: Not on file  . Highest education level: Not on file  Occupational History  . Occupation: Retired  Tobacco Use  . Smoking status: Former Smoker    Packs/day: 1.00    Years: 40.00    Pack years: 40.00    Types: Cigarettes    Quit date: 09/15/2007    Years since quitting: 12.6  . Smokeless tobacco: Never Used  Vaping Use  . Vaping Use: Never used  Substance and Sexual Activity  . Alcohol use: Yes    Alcohol/week: 6.0 standard drinks    Types: 6 Glasses of wine per week  . Drug use: No  . Sexual activity: Never  Other Topics Concern  . Not on file  Social History Narrative   She lives in [place], [place]. Her daughter helps with her care.    Social Determinants of Health   Financial Resource Strain:   . Difficulty of Paying Living Expenses:   Food Insecurity:   . Worried About Charity fundraiser in the Last Year:   . Arboriculturist in the Last Year:   Transportation Needs:   . Film/video editor (Medical):   [name] Lack of Transportation (Non-Medical):   Physical Activity:   . Days of Exercise per Week:   . Minutes of Exercise per Session:   Stress:   . Feeling of Stress :   Social Connections:   . Frequency of Communication with Friends and Family:   . Frequency of Social Gatherings with Friends and Family:   . Attends Religious Services:   . Active Member of Clubs or Organizations:   . Attends Archivist Meetings:   [name] Marital Status:   Intimate Partner Violence:   . Fear of Current or Ex-Partner:   . Emotionally Abused:   [name] Physically Abused:   . Sexually Abused:      Review of Systems: General: negative for chills, fever, night sweats or weight changes.  Cardiovascular: negative for chest pain, dyspnea on exertion, edema, orthopnea, palpitations, paroxysmal  nocturnal dyspnea or shortness of breath Dermatological: negative for rash Respiratory: negative for cough or wheezing Urologic: negative for hematuria Abdominal: negative for nausea, vomiting, diarrhea, bright red blood per rectum, melena, or hematemesis Neurologic: negative for visual changes, syncope, or dizziness All other systems reviewed and are otherwise negative except as noted above.    Blood pressure 128/78, pulse (!) 53, height 5\' 3"  (1.6 m), weight 157 lb 3.2 oz (71.3 kg), SpO2 99 %.  General appearance: alert and no distress Neck: no adenopathy, no carotid bruit, no JVD, supple, symmetrical, trachea midline and thyroid not enlarged, symmetric, no tenderness/mass/nodules Lungs: clear to auscultation bilaterally Heart: regular rate and rhythm, S1,  S2 normal, no murmur, click, rub or gallop Extremities: extremities normal, atraumatic, no cyanosis or edema Pulses: 2+ and symmetric Skin: Skin color, texture, turgor normal. No rashes or lesions Neurologic: Alert and oriented X 3, normal strength and tone. Normal symmetric reflexes. Normal coordination and gait  EKG not performed today  ASSESSMENT AND PLAN:   Claudication in peripheral vascular disease ([place]) [name] returns a for follow-up of her Doppler studies performed because of known iliac disease and claudication.  She says her claudication is progressively getting worse and is lifestyle limiting.  She had Dopplers performed 04/22/2020 revealing a right ABI of 1.01 on the left 0.83.  She does have bilateral iliac disease by duplex ultrasound.  She wishes to proceed with outpatient peripheral angiography with endovascular therapy.  She is on Coumadin for PAF will need to stop that 4 days prior.      Bridget Harp MD FACP,FACC,FAHA, [facility] 05/04/2020 4:14 PM

## 2020-05-05 ENCOUNTER — Telehealth: Payer: Self-pay | Admitting: Cardiovascular Disease

## 2020-05-05 ENCOUNTER — Other Ambulatory Visit: Payer: Self-pay | Admitting: *Deleted

## 2020-05-05 DIAGNOSIS — I739 Peripheral vascular disease, unspecified: Secondary | ICD-10-CM

## 2020-05-05 NOTE — Telephone Encounter (Signed)
Spoke with patient regarding follow up doppler appointment ((post angiogram) scheduled 05/27/20 at 1:00pm and follow up appointment with Dr. Gwenlyn Found scheduled 06/01/20 at 1:30 pm.  Patient voiced her understanding.

## 2020-05-05 NOTE — Telephone Encounter (Signed)
Spoke with the patient. She was calling with a few more questions concerning the procedure.  She wanted to know the last day she should hold Coumadin. She has been advised that per Dr. Kennon Holter note she should hold it 4 days prior.  She also wanted Dr. Gwenlyn Found to know that in 2016 she had a mesh placed to repair her hernia. She wanted to know if that would interfere with the procedure. She has been advised that Dr. Gwenlyn Found would be in the artery therefore it should not. She would still like for him to be aware that the mesh is present.  She will get her labs completed today or tomorrow.

## 2020-05-05 NOTE — Telephone Encounter (Signed)
Patient is calling to confirm procedure scheduled for 05/13/20. She states she would also like to discuss instructions for procedure. Please call.

## 2020-05-05 NOTE — Telephone Encounter (Signed)
Returned the call to the patient. She was calling to let Dr. Gwenlyn Found know that she has confirmed the procedure date with her daughter and would like to proceed with the angiogram.   Instructions for the procedure have been gone over with the patient. She has been advised to call back if anything further is needed.

## 2020-05-05 NOTE — Telephone Encounter (Signed)
New Message:   Please call, pt says she still have some more questions about her procedure on 05-13-20.

## 2020-05-07 ENCOUNTER — Telehealth: Payer: Self-pay

## 2020-05-07 NOTE — Telephone Encounter (Signed)
Spoke to patient advised PV cath has been moved to 11:30 on 7/1.Arrive at 9:30 am.She forget to mention when she saw Dr.Berry last week she has Raynaud's.She also is concerned she has had a incisional hernia repair in the past and has mesh near iliac artery.Advised I will send message to Dr.Berry to make him aware.

## 2020-05-08 LAB — BASIC METABOLIC PANEL
BUN/Creatinine Ratio: 29 — ABNORMAL HIGH (ref 12–28)
BUN: 23 mg/dL (ref 8–27)
CO2: 23 mmol/L (ref 20–29)
Calcium: 9 mg/dL (ref 8.7–10.3)
Chloride: 100 mmol/L (ref 96–106)
Creatinine, Ser: 0.78 mg/dL (ref 0.57–1.00)
GFR calc Af Amer: 86 mL/min/{1.73_m2} (ref >59)
GFR calc non Af Amer: 75 mL/min/{1.73_m2} (ref >59)
Glucose: 119 mg/dL — ABNORMAL HIGH (ref 65–99)
Potassium: 4.5 mmol/L (ref 3.5–5.2)
Sodium: 137 mmol/L (ref 134–144)

## 2020-05-08 LAB — CBC
Hematocrit: 39.5 % (ref 34.0–46.6)
Hemoglobin: 13.4 g/dL (ref 11.1–15.9)
MCH: 32 pg (ref 26.6–33.0)
MCHC: 33.9 g/dL (ref 31.5–35.7)
MCV: 94 fL (ref 79–97)
Platelets: 194 10*3/uL (ref 150–450)
RBC: 4.19 x10E6/uL (ref 3.77–5.28)
RDW: 13.2 % (ref 11.7–15.4)
WBC: 12.8 10*3/uL — ABNORMAL HIGH (ref 3.4–10.8)

## 2020-05-08 LAB — PROTIME-INR
INR: 1.7 — ABNORMAL HIGH (ref 0.9–1.2)
Prothrombin Time: 17.7 s — ABNORMAL HIGH (ref 9.1–12.0)

## 2020-05-09 NOTE — Telephone Encounter (Signed)
Noted. Not an issue.  JJB

## 2020-05-10 ENCOUNTER — Telehealth: Payer: Self-pay | Admitting: Adult Health

## 2020-05-10 NOTE — Telephone Encounter (Signed)
New Message:    Please call, pt says she still have somee questions about her procedure on Thursday.

## 2020-05-10 NOTE — Telephone Encounter (Signed)
Spoke with the patient and went over instructions regarding medications prior to her procedure. Patient has stopped taking coumadin, she will hold lisinopril the morning of and will take an 81 mg aspirin. Patient verbalized understanding.  Patient also wants to make sure Dr. Gwenlyn Found is aware of the mesh that she has from a hernia repair. I advised her that Dr. Gwenlyn Found is already aware and that it would not be an issue.  Patient would also like Dr. Gwenlyn Found to know that she has Raynaud's syndrome.  She also states that she would like to know the percentage of blockage that is present because they were not able to tell on the ultrasound.

## 2020-05-10 NOTE — Telephone Encounter (Signed)
Spoke with pt, states that TP had asked her to follow up with her after seeing her cardiologist, so she wanted to keep TP in the loop: Pt has a blockage in her aorta, is scheduled with Dr. Gwenlyn Found to undergo an abdominal aortogram with doppler on 7/1.  I advised that I would let TP know, but TP was out of town this week and wouldn't be back in office until 7/7. Pt expressed understanding.  I offered a televisit with another NP to discuss this issue further but pt declined stating that wasn't necessary.  Pt would like to speak to TP directly when TP returns to clinic.    Forwarding message to TP as FYI.

## 2020-05-11 ENCOUNTER — Telehealth: Payer: Self-pay

## 2020-05-11 ENCOUNTER — Ambulatory Visit: Payer: Medicare Other | Admitting: Cardiovascular Disease

## 2020-05-11 NOTE — Telephone Encounter (Signed)
LMTCB for her pre- PV Procedure call:    Pt contacted pre-catheterization scheduled at [facility] for: 05/13/20 Verified arrival time and place: [facility]) at: 9:30 am   No solid food after midnight prior to cath, clear liquids until 5 AM day of procedure.   AM meds can be  taken pre-cath with sips of water including: ASA 81 mg Should be holding her Coumadin, Lisinopril a.m. of the procedure.   Confirmed patient has responsible adult to drive home post procedure and observe 24 hours after arriving home:   You are allowed ONE visitor in the waiting room during your procedure. Both you and your visitor must wear masks.      COVID-19 Pre-Screening Questions:  . In the past 7 to 10 days have you had a new cough, shortness of breath, headache, congestion, fever (100 or greater) unexplained body aches, new sore throat, or sudden loss of taste or sense of smell? [name] In the past 7 to 10 days have you been around anyone with known Covid 19?

## 2020-05-12 NOTE — Telephone Encounter (Signed)
Dopplers show blockages in both iliac arteries.

## 2020-05-12 NOTE — Telephone Encounter (Signed)
Returned call to pt. She states that her question is not about labs/LFT she is asking if Dr Gwenlyn Found is doing the procedure on just the left LE or both. If he is doing both, Why?? Tried to re-assure pt concerns but, she wishes for me to forward to JB for his explanation. Will forward for JB advice.

## 2020-05-12 NOTE — Telephone Encounter (Signed)
Lm for pt to call back will send her a My Chart and close out this call.

## 2020-05-12 NOTE — Telephone Encounter (Signed)
Pt notified, informed of JB message.  Pt had more questions, informed pt that she should discuss these questions at that time.She states that she call and was connected with the Cath lab and was told that JB would be using new equipment. She states that she does not want to be a [place] pig and may cancel procedure. Informed that will be fine and to discuss this one again tomorrow. Verbalized understanding.

## 2020-05-12 NOTE — Telephone Encounter (Signed)
Follow Up  Patient is calling in to get a better understanding of the procedure and how the dye will affect her BUN/Creatine level. Please call back to discuss.

## 2020-05-13 ENCOUNTER — Encounter (HOSPITAL_COMMUNITY)
Admission: RE | Disposition: A | Discharge status: Home / Self Care | Payer: Self-pay | Source: Home / Self Care | Attending: Cardiovascular Disease

## 2020-05-13 ENCOUNTER — Other Ambulatory Visit: Payer: Self-pay

## 2020-05-13 ENCOUNTER — Inpatient Hospital Stay (HOSPITAL_COMMUNITY)
Admission: RE | Admit: 2020-05-13 | Discharge: 2020-05-16 | DRG: 270 | Disposition: A | Discharge status: Home / Self Care | Payer: Medicare Other | Attending: Cardiovascular Disease | Admitting: Cardiovascular Disease

## 2020-05-13 ENCOUNTER — Other Ambulatory Visit: Payer: Self-pay | Admitting: *Deleted

## 2020-05-13 DIAGNOSIS — I10 Essential (primary) hypertension: Secondary | ICD-10-CM | POA: Diagnosis present

## 2020-05-13 DIAGNOSIS — I70213 Atherosclerosis of native arteries of extremities with intermittent claudication, bilateral legs: Principal | ICD-10-CM | POA: Diagnosis present

## 2020-05-13 DIAGNOSIS — I959 Hypotension, unspecified: Secondary | ICD-10-CM | POA: Diagnosis not present

## 2020-05-13 DIAGNOSIS — I25118 Atherosclerotic heart disease of native coronary artery with other forms of angina pectoris: Secondary | ICD-10-CM | POA: Diagnosis present

## 2020-05-13 DIAGNOSIS — J449 Chronic obstructive pulmonary disease, unspecified: Secondary | ICD-10-CM | POA: Diagnosis present

## 2020-05-13 DIAGNOSIS — Z8249 Family history of ischemic heart disease and other diseases of the circulatory system: Secondary | ICD-10-CM

## 2020-05-13 DIAGNOSIS — D62 Acute posthemorrhagic anemia: Secondary | ICD-10-CM

## 2020-05-13 DIAGNOSIS — E785 Hyperlipidemia, unspecified: Secondary | ICD-10-CM | POA: Diagnosis present

## 2020-05-13 DIAGNOSIS — I700 Atherosclerosis of aorta: Secondary | ICD-10-CM | POA: Diagnosis present

## 2020-05-13 DIAGNOSIS — Z7901 Long term (current) use of anticoagulants: Secondary | ICD-10-CM

## 2020-05-13 DIAGNOSIS — K661 Hemoperitoneum: Secondary | ICD-10-CM

## 2020-05-13 DIAGNOSIS — K683 Retroperitoneal hematoma: Secondary | ICD-10-CM

## 2020-05-13 DIAGNOSIS — I4892 Unspecified atrial flutter: Secondary | ICD-10-CM

## 2020-05-13 DIAGNOSIS — Z79899 Other long term (current) drug therapy: Secondary | ICD-10-CM

## 2020-05-13 DIAGNOSIS — Z87891 Personal history of nicotine dependence: Secondary | ICD-10-CM

## 2020-05-13 DIAGNOSIS — I480 Paroxysmal atrial fibrillation: Secondary | ICD-10-CM | POA: Diagnosis present

## 2020-05-13 DIAGNOSIS — Z951 Presence of aortocoronary bypass graft: Secondary | ICD-10-CM

## 2020-05-13 DIAGNOSIS — I739 Peripheral vascular disease, unspecified: Secondary | ICD-10-CM | POA: Diagnosis present

## 2020-05-13 DIAGNOSIS — I97618 Postprocedural hemorrhage and hematoma of a circulatory system organ or structure following other circulatory system procedure: Secondary | ICD-10-CM | POA: Diagnosis not present

## 2020-05-13 HISTORY — PX: PERIPHERAL VASCULAR INTERVENTION: CATH118257

## 2020-05-13 HISTORY — PX: ABDOMINAL AORTOGRAM W/LOWER EXTREMITY: CATH118223

## 2020-05-13 LAB — PROTIME-INR
INR: 1.2 (ref 0.8–1.2)
Prothrombin Time: 14.4 seconds (ref 11.4–15.2)

## 2020-05-13 LAB — CBC
HCT: 30.1 % — ABNORMAL LOW (ref 36.0–46.0)
Hemoglobin: 9.9 g/dL — ABNORMAL LOW (ref 12.0–15.0)
MCH: 31 pg (ref 26.0–34.0)
MCHC: 32.9 g/dL (ref 30.0–36.0)
MCV: 94.4 fL (ref 80.0–100.0)
Platelets: 154 10*3/uL (ref 150–400)
RBC: 3.19 MIL/uL — ABNORMAL LOW (ref 3.87–5.11)
RDW: 13.2 % (ref 11.5–15.5)
WBC: 13.8 10*3/uL — ABNORMAL HIGH (ref 4.0–10.5)
nRBC: 0 % (ref 0.0–0.2)

## 2020-05-13 LAB — POCT ACTIVATED CLOTTING TIME
Activated Clotting Time: 312 seconds
Activated Clotting Time: 252 seconds
Activated Clotting Time: 329 seconds
Activated Clotting Time: 230 seconds
Activated Clotting Time: 164 seconds
Activated Clotting Time: 180 seconds

## 2020-05-13 SURGERY — ABDOMINAL AORTOGRAM W/LOWER EXTREMITY
Anesthesia: LOCAL

## 2020-05-13 MED ORDER — NITROGLYCERIN 0.4 MG SL SUBL: 0.4000 mg | SUBLINGUAL_TABLET | SUBLINGUAL | Status: DC | PRN

## 2020-05-13 MED ORDER — LIDOCAINE HCL (PF) 1 % IJ SOLN
INTRAMUSCULAR | Status: AC
  Filled 2020-05-13: qty 30

## 2020-05-13 MED ORDER — METOPROLOL TARTRATE 25 MG PO TABS: 25.0000 mg | ORAL_TABLET | Freq: Every day | ORAL | Status: DC | PRN

## 2020-05-13 MED ORDER — DOCUSATE SODIUM 100 MG PO CAPS
200.0000 mg | ORAL_CAPSULE | Freq: Every evening | ORAL | Status: DC
  Administered 2020-05-14 – 2020-05-15 (×2): 200 mg via ORAL
  Filled 2020-05-13 (×2): qty 2

## 2020-05-13 MED ORDER — IODIXANOL 320 MG/ML IV SOLN
INTRAVENOUS | Status: DC | PRN
  Administered 2020-05-13: 175 mL via INTRA_ARTERIAL

## 2020-05-13 MED ORDER — SODIUM CHLORIDE 0.9 % IV SOLN: 250.0000 mL | INTRAVENOUS | Status: DC | PRN

## 2020-05-13 MED ORDER — ASPIRIN 81 MG PO CHEW: 81.0000 mg | CHEWABLE_TABLET | ORAL | Status: DC

## 2020-05-13 MED ORDER — HEPARIN SODIUM (PORCINE) 1000 UNIT/ML IJ SOLN
INTRAMUSCULAR | Status: AC
  Filled 2020-05-13: qty 1

## 2020-05-13 MED ORDER — MIDAZOLAM HCL 5 MG/5ML IJ SOLN
INTRAMUSCULAR | Status: DC | PRN
  Administered 2020-05-13 (×4): 1 mg via INTRAVENOUS

## 2020-05-13 MED ORDER — SODIUM CHLORIDE 0.9% FLUSH: 3.0000 mL | INTRAVENOUS | Status: DC | PRN

## 2020-05-13 MED ORDER — FENTANYL CITRATE (PF) 100 MCG/2ML IJ SOLN
INTRAMUSCULAR | Status: DC | PRN
  Administered 2020-05-13 (×3): 25 ug via INTRAVENOUS

## 2020-05-13 MED ORDER — METOPROLOL SUCCINATE ER 25 MG PO TB24: 25.0000 mg | ORAL_TABLET | Freq: Every day | ORAL | Status: DC

## 2020-05-13 MED ORDER — FLUTICASONE PROPIONATE 50 MCG/ACT NA SUSP
1.0000 | Freq: Every evening | NASAL | Status: DC | PRN
  Filled 2020-05-13: qty 16

## 2020-05-13 MED ORDER — SODIUM CHLORIDE 0.9 % IV SOLN
INTRAVENOUS | Status: AC | PRN
  Administered 2020-05-13: 250 mL/h via INTRAVENOUS

## 2020-05-13 MED ORDER — ISOSORBIDE MONONITRATE ER 30 MG PO TB24
30.0000 mg | ORAL_TABLET | Freq: Two times a day (BID) | ORAL | Status: DC
  Filled 2020-05-13: qty 1

## 2020-05-13 MED ORDER — SODIUM CHLORIDE 0.9 % WEIGHT BASED INFUSION
3.0000 mL/kg/h | INTRAVENOUS | Status: DC
  Administered 2020-05-13: 3 mL/kg/h via INTRAVENOUS

## 2020-05-13 MED ORDER — HEPARIN SODIUM (PORCINE) 1000 UNIT/ML IJ SOLN
INTRAMUSCULAR | Status: DC | PRN
  Administered 2020-05-13: 4000 [IU] via INTRAVENOUS
  Administered 2020-05-13: 8000 [IU] via INTRAVENOUS

## 2020-05-13 MED ORDER — ONDANSETRON HCL 4 MG/2ML IJ SOLN
INTRAMUSCULAR | Status: AC
  Filled 2020-05-13: qty 2

## 2020-05-13 MED ORDER — VITAMIN B-6 100 MG PO TABS
100.0000 mg | ORAL_TABLET | Freq: Every day | ORAL | Status: DC
  Administered 2020-05-14 – 2020-05-16 (×3): 100 mg via ORAL
  Filled 2020-05-13 (×3): qty 1

## 2020-05-13 MED ORDER — SODIUM CHLORIDE 0.9% FLUSH
3.0000 mL | Freq: Two times a day (BID) | INTRAVENOUS | Status: DC
  Administered 2020-05-14 – 2020-05-16 (×3): 3 mL via INTRAVENOUS

## 2020-05-13 MED ORDER — CLOPIDOGREL BISULFATE 300 MG PO TABS
ORAL_TABLET | ORAL | Status: AC
  Filled 2020-05-13: qty 1

## 2020-05-13 MED ORDER — MORPHINE SULFATE (PF) 2 MG/ML IV SOLN
INTRAVENOUS | Status: AC
  Filled 2020-05-13: qty 1

## 2020-05-13 MED ORDER — ASPIRIN EC 81 MG PO TBEC: 81.0000 mg | DELAYED_RELEASE_TABLET | Freq: Every day | ORAL | Status: DC

## 2020-05-13 MED ORDER — ACETAMINOPHEN 325 MG PO TABS
ORAL_TABLET | ORAL | Status: AC
  Filled 2020-05-13: qty 2

## 2020-05-13 MED ORDER — NITROGLYCERIN IN D5W 200-5 MCG/ML-% IV SOLN
INTRAVENOUS | Status: AC
  Filled 2020-05-13: qty 250

## 2020-05-13 MED ORDER — FENTANYL CITRATE (PF) 100 MCG/2ML IJ SOLN
INTRAMUSCULAR | Status: AC
  Filled 2020-05-13: qty 2

## 2020-05-13 MED ORDER — VIPERSLIDE LUBRICANT OPTIME: TOPICAL | Status: DC | PRN

## 2020-05-13 MED ORDER — MORPHINE SULFATE (PF) 2 MG/ML IV SOLN
2.0000 mg | INTRAVENOUS | Status: DC | PRN
  Administered 2020-05-13: 2 mg via INTRAVENOUS
  Filled 2020-05-13: qty 1

## 2020-05-13 MED ORDER — MIDAZOLAM HCL 5 MG/5ML IJ SOLN
INTRAMUSCULAR | Status: AC
  Filled 2020-05-13: qty 5

## 2020-05-13 MED ORDER — ACETAMINOPHEN 325 MG PO TABS
650.0000 mg | ORAL_TABLET | ORAL | Status: DC | PRN
  Administered 2020-05-13: 650 mg via ORAL
  Administered 2020-05-13: 325 mg via ORAL
  Administered 2020-05-15 – 2020-05-16 (×3): 650 mg via ORAL
  Filled 2020-05-13 (×4): qty 2

## 2020-05-13 MED ORDER — SODIUM CHLORIDE 0.9% FLUSH
3.0000 mL | Freq: Two times a day (BID) | INTRAVENOUS | Status: DC
  Administered 2020-05-14 – 2020-05-15 (×4): 3 mL via INTRAVENOUS

## 2020-05-13 MED ORDER — ONDANSETRON HCL 4 MG/2ML IJ SOLN
4.0000 mg | Freq: Four times a day (QID) | INTRAMUSCULAR | Status: DC | PRN
  Administered 2020-05-13: 4 mg via INTRAVENOUS

## 2020-05-13 MED ORDER — CLOPIDOGREL BISULFATE 300 MG PO TABS
ORAL_TABLET | ORAL | Status: DC | PRN
  Administered 2020-05-13: 300 mg via ORAL

## 2020-05-13 MED ORDER — VALACYCLOVIR HCL 500 MG PO TABS
2000.0000 mg | ORAL_TABLET | Freq: Every day | ORAL | Status: DC | PRN
  Filled 2020-05-13: qty 4

## 2020-05-13 MED ORDER — VITAMIN B-12 1000 MCG PO TABS
1000.0000 ug | ORAL_TABLET | Freq: Every day | ORAL | Status: DC
  Administered 2020-05-14 – 2020-05-16 (×3): 1000 ug via ORAL
  Filled 2020-05-13 (×3): qty 1

## 2020-05-13 MED ORDER — LABETALOL HCL 5 MG/ML IV SOLN: 10.0000 mg | INTRAVENOUS | Status: DC | PRN

## 2020-05-13 MED ORDER — HEPARIN (PORCINE) IN NACL 1000-0.9 UT/500ML-% IV SOLN
INTRAVENOUS | Status: DC | PRN
  Administered 2020-05-13 (×2): 500 mL

## 2020-05-13 MED ORDER — COQ10 100 MG PO CAPS: 100.0000 mg | ORAL_CAPSULE | Freq: Every day | ORAL | Status: DC

## 2020-05-13 MED ORDER — VERAPAMIL HCL 2.5 MG/ML IV SOLN
INTRAVENOUS | Status: AC
  Filled 2020-05-13: qty 2

## 2020-05-13 MED ORDER — HYDRALAZINE HCL 20 MG/ML IJ SOLN: 5.0000 mg | INTRAMUSCULAR | Status: DC | PRN

## 2020-05-13 MED ORDER — VITAMIN D3 25 MCG (1000 UNIT) PO TABS
1000.0000 [IU] | ORAL_TABLET | Freq: Every day | ORAL | Status: DC
  Administered 2020-05-14 – 2020-05-16 (×3): 1000 [IU] via ORAL
  Filled 2020-05-13 (×6): qty 1

## 2020-05-13 MED ORDER — LIDOCAINE HCL (PF) 1 % IJ SOLN
INTRAMUSCULAR | Status: DC | PRN
  Administered 2020-05-13 (×2): 30 mL via INTRADERMAL

## 2020-05-13 MED ORDER — CLOPIDOGREL BISULFATE 75 MG PO TABS: 75.0000 mg | ORAL_TABLET | Freq: Every day | ORAL | Status: DC

## 2020-05-13 MED ORDER — MIDAZOLAM HCL 2 MG/2ML IJ SOLN: INTRAMUSCULAR | Status: DC | PRN

## 2020-05-13 MED ORDER — ACETAMINOPHEN 500 MG PO TABS: 1000.0000 mg | ORAL_TABLET | Freq: Three times a day (TID) | ORAL | Status: DC | PRN

## 2020-05-13 MED ORDER — ALBUTEROL SULFATE (2.5 MG/3ML) 0.083% IN NEBU: 3.0000 mL | INHALATION_SOLUTION | RESPIRATORY_TRACT | Status: DC | PRN

## 2020-05-13 MED ORDER — SODIUM CHLORIDE 0.9 % IV SOLN: INTRAVENOUS | Status: DC

## 2020-05-13 MED ORDER — SODIUM CHLORIDE 0.9 % WEIGHT BASED INFUSION
1.0000 mL/kg/h | INTRAVENOUS | Status: DC
  Administered 2020-05-13: 1 mL/kg/h via INTRAVENOUS

## 2020-05-13 MED ORDER — HEPARIN (PORCINE) IN NACL 1000-0.9 UT/500ML-% IV SOLN
INTRAVENOUS | Status: AC
  Filled 2020-05-13: qty 1000

## 2020-05-13 MED ORDER — LISINOPRIL 20 MG PO TABS: 20.0000 mg | ORAL_TABLET | Freq: Every day | ORAL | Status: DC

## 2020-05-13 SURGICAL SUPPLY — 30 items
BALLN MUSTANG 5.0X20 75 (BALLOONS) ×3
BALLOON MUSTANG 5.0X20 75 (BALLOONS) ×2 IMPLANT
CATH ANGIO 5F PIGTAIL 65CM (CATHETERS) ×3 IMPLANT
CATH NAVICROSS ANG 65CM (CATHETERS) ×2 IMPLANT
CATH STRAIGHT 5FR 65CM (CATHETERS) ×3 IMPLANT
CATHETER NAVICROSS ANG 65CM (CATHETERS) ×3
DEVICE CONTINUOUS FLUSH (MISCELLANEOUS) ×3 IMPLANT
DIAMONDBACK SOLID OAS 2.0MM (CATHETERS) ×3
GUIDEWIRE ANGLED .035X150CM (WIRE) ×3 IMPLANT
KIT ENCORE 26 ADVANTAGE (KITS) ×6 IMPLANT
KIT MICROPUNCTURE NIT STIFF (SHEATH) ×3 IMPLANT
KIT PV (KITS) ×3 IMPLANT
LUBRICANT VIPERSLIDE CORONARY (MISCELLANEOUS) ×3 IMPLANT
SHEATH BRITE TIP 7FR 35CM (SHEATH) ×6 IMPLANT
SHEATH PINNACLE 5F 10CM (SHEATH) ×6 IMPLANT
SHEATH PINNACLE 7F 10CM (SHEATH) ×6 IMPLANT
SHEATH PROBE COVER 6X72 (BAG) ×3 IMPLANT
STENT VIABAHN 8X29X80 VBX (Permanent Stent) ×3 IMPLANT
STENT VIABAHN 8X39X80 VBX (Permanent Stent) ×3 IMPLANT
STOPCOCK MORSE 400PSI 3WAY (MISCELLANEOUS) ×3 IMPLANT
SYR MEDRAD MARK 7 150ML (SYRINGE) ×3 IMPLANT
SYSTEM DIMNDBCK SLD OAS 2.0MM (CATHETERS) ×2 IMPLANT
TAPE VIPERTRACK RADIOPAQ (MISCELLANEOUS) ×4 IMPLANT
TAPE VIPERTRACK RADIOPAQUE (MISCELLANEOUS) ×6
TRANSDUCER W/STOPCOCK (MISCELLANEOUS) ×3 IMPLANT
TRAY PV CATH (CUSTOM PROCEDURE TRAY) ×3 IMPLANT
TUBING CIL FLEX 10 FLL-RA (TUBING) ×3 IMPLANT
WIRE AMPLATZ SS-J .035X180CM (WIRE) ×3 IMPLANT
WIRE HITORQ VERSACORE ST 145CM (WIRE) ×9 IMPLANT
WIRE VIPER ADVANCE .017X335CM (WIRE) ×3 IMPLANT

## 2020-05-13 NOTE — Progress Notes (Signed)
Rt femoral arterial sheath aspirated and removed.  Manual pressure held for 20 mins, hemostasis achieved.  Site level 0. Brusing prior to sheath pull, distal to insertion, brusing noted to be medial to lateral at right groin site.  Right distal DP pulse present. Tegaderm dressing applied. Instructions given to pt.    Left femoral arterial sheath aspirated and removed.  Manual pressure held for 20 mins, hemostasis achieved. Hematoma borders present and marked prior to sheath pull. Site level 0, hematoma borders not present after sheath removal.  Brusing prior to sheath pull, distal to insertion, brusing noted to be medial to lateral at left groin site. Left distal DP pulse present. Tegaderm dressing applied.    Bedrest for 6 hrs, begins at 1810.

## 2020-05-13 NOTE — Consult Note (Signed)
Paged for asx hypotension. Patient generally concerned that BP is much lower than baseline preprocedure (sBP 130s at home, occasionally 90s). D/c HTN meds (imdur 30 mg bid, lisinopril 20 mg daily, toprol XL 25 mg PO daily). She has not received any HTN meds since admission today. Exam with b/l ecchymosis over recent femoral sheath sites. Sheaths removed at 1724 with ACT <180 (164). No preprocedural labs, prior Hb (12.8) on 05/07/20, Hb ranges (12-14) in past. She does report mild (3/10 severity) band like back pain that is new although she does say that having to lay still in bed may be contributing. Will obtain labs (CBC, BMP, INR, T&S) and if significant Hb drop then CT scan. Otherwise will continue to monitor overnight.

## 2020-05-13 NOTE — Interval H&P Note (Signed)
History and Physical Interval Note:  05/13/2020 11:18 AM  [name]  has presented today for surgery, with the diagnosis of pad.  The various methods of treatment have been discussed with the patient and family. After consideration of risks, benefits and other options for treatment, the patient has consented to  Procedure(s): ABDOMINAL AORTOGRAM W/LOWER EXTREMITY (N/A) as a surgical intervention.  The patient's history has been reviewed, patient examined, no change in status, stable for surgery.  I have reviewed the patient's chart and labs.  Questions were answered to the patient's satisfaction.     Quay Burow

## 2020-05-14 ENCOUNTER — Other Ambulatory Visit: Payer: Self-pay

## 2020-05-14 ENCOUNTER — Telehealth: Payer: Self-pay | Admitting: Cardiovascular Disease

## 2020-05-14 ENCOUNTER — Encounter (HOSPITAL_COMMUNITY): Payer: Self-pay | Admitting: Cardiovascular Disease

## 2020-05-14 ENCOUNTER — Ambulatory Visit (HOSPITAL_COMMUNITY): Payer: Medicare Other

## 2020-05-14 DIAGNOSIS — Z7901 Long term (current) use of anticoagulants: Secondary | ICD-10-CM | POA: Diagnosis not present

## 2020-05-14 DIAGNOSIS — I480 Paroxysmal atrial fibrillation: Secondary | ICD-10-CM | POA: Diagnosis present

## 2020-05-14 DIAGNOSIS — J449 Chronic obstructive pulmonary disease, unspecified: Secondary | ICD-10-CM | POA: Diagnosis present

## 2020-05-14 DIAGNOSIS — D62 Acute posthemorrhagic anemia: Secondary | ICD-10-CM | POA: Diagnosis not present

## 2020-05-14 DIAGNOSIS — I70213 Atherosclerosis of native arteries of extremities with intermittent claudication, bilateral legs: Secondary | ICD-10-CM | POA: Diagnosis present

## 2020-05-14 DIAGNOSIS — Z951 Presence of aortocoronary bypass graft: Secondary | ICD-10-CM | POA: Diagnosis not present

## 2020-05-14 DIAGNOSIS — I4892 Unspecified atrial flutter: Secondary | ICD-10-CM | POA: Diagnosis present

## 2020-05-14 DIAGNOSIS — I771 Stricture of artery: Secondary | ICD-10-CM | POA: Insufficient documentation

## 2020-05-14 DIAGNOSIS — I25118 Atherosclerotic heart disease of native coronary artery with other forms of angina pectoris: Secondary | ICD-10-CM | POA: Diagnosis present

## 2020-05-14 DIAGNOSIS — I739 Peripheral vascular disease, unspecified: Secondary | ICD-10-CM | POA: Diagnosis present

## 2020-05-14 DIAGNOSIS — I10 Essential (primary) hypertension: Secondary | ICD-10-CM | POA: Diagnosis present

## 2020-05-14 DIAGNOSIS — I97618 Postprocedural hemorrhage and hematoma of a circulatory system organ or structure following other circulatory system procedure: Secondary | ICD-10-CM | POA: Diagnosis not present

## 2020-05-14 DIAGNOSIS — Z8249 Family history of ischemic heart disease and other diseases of the circulatory system: Secondary | ICD-10-CM | POA: Diagnosis not present

## 2020-05-14 DIAGNOSIS — I959 Hypotension, unspecified: Secondary | ICD-10-CM | POA: Diagnosis not present

## 2020-05-14 DIAGNOSIS — R58 Hemorrhage, not elsewhere classified: Secondary | ICD-10-CM | POA: Diagnosis not present

## 2020-05-14 DIAGNOSIS — K661 Hemoperitoneum: Secondary | ICD-10-CM | POA: Diagnosis not present

## 2020-05-14 DIAGNOSIS — E785 Hyperlipidemia, unspecified: Secondary | ICD-10-CM | POA: Diagnosis present

## 2020-05-14 DIAGNOSIS — Z87891 Personal history of nicotine dependence: Secondary | ICD-10-CM | POA: Diagnosis not present

## 2020-05-14 DIAGNOSIS — Z79899 Other long term (current) drug therapy: Secondary | ICD-10-CM | POA: Diagnosis not present

## 2020-05-14 DIAGNOSIS — I700 Atherosclerosis of aorta: Secondary | ICD-10-CM | POA: Diagnosis present

## 2020-05-14 LAB — CBC
HCT: 30.6 % — ABNORMAL LOW (ref 36.0–46.0)
HCT: 27.3 % — ABNORMAL LOW (ref 36.0–46.0)
HCT: 27.6 % — ABNORMAL LOW (ref 36.0–46.0)
Hemoglobin: 10.1 g/dL — ABNORMAL LOW (ref 12.0–15.0)
Hemoglobin: 9 g/dL — ABNORMAL LOW (ref 12.0–15.0)
Hemoglobin: 9.2 g/dL — ABNORMAL LOW (ref 12.0–15.0)
MCH: 31.9 pg (ref 26.0–34.0)
MCH: 31.4 pg (ref 26.0–34.0)
MCH: 31.7 pg (ref 26.0–34.0)
MCHC: 33 g/dL (ref 30.0–36.0)
MCHC: 33 g/dL (ref 30.0–36.0)
MCHC: 33.3 g/dL (ref 30.0–36.0)
MCV: 96.5 fL (ref 80.0–100.0)
MCV: 95.1 fL (ref 80.0–100.0)
MCV: 95.2 fL (ref 80.0–100.0)
Platelets: 163 10*3/uL (ref 150–400)
Platelets: 144 10*3/uL — ABNORMAL LOW (ref 150–400)
Platelets: 138 10*3/uL — ABNORMAL LOW (ref 150–400)
RBC: 3.17 MIL/uL — ABNORMAL LOW (ref 3.87–5.11)
RBC: 2.87 MIL/uL — ABNORMAL LOW (ref 3.87–5.11)
RBC: 2.9 MIL/uL — ABNORMAL LOW (ref 3.87–5.11)
RDW: 13.3 % (ref 11.5–15.5)
RDW: 13.2 % (ref 11.5–15.5)
RDW: 13.4 % (ref 11.5–15.5)
WBC: 13.6 10*3/uL — ABNORMAL HIGH (ref 4.0–10.5)
WBC: 11.7 10*3/uL — ABNORMAL HIGH (ref 4.0–10.5)
WBC: 12.2 10*3/uL — ABNORMAL HIGH (ref 4.0–10.5)
nRBC: 0 % (ref 0.0–0.2)
nRBC: 0 % (ref 0.0–0.2)
nRBC: 0 % (ref 0.0–0.2)

## 2020-05-14 LAB — TYPE AND SCREEN
ABO/RH(D): O POS
Antibody Screen: NEGATIVE

## 2020-05-14 LAB — BASIC METABOLIC PANEL
Anion gap: 7 (ref 5–15)
Anion gap: 7 (ref 5–15)
BUN: 13 mg/dL (ref 8–23)
BUN: 19 mg/dL (ref 8–23)
CO2: 21 mmol/L — ABNORMAL LOW (ref 22–32)
CO2: 23 mmol/L (ref 22–32)
Calcium: 7.5 mg/dL — ABNORMAL LOW (ref 8.9–10.3)
Calcium: 7.6 mg/dL — ABNORMAL LOW (ref 8.9–10.3)
Chloride: 105 mmol/L (ref 98–111)
Chloride: 106 mmol/L (ref 98–111)
Creatinine, Ser: 0.64 mg/dL (ref 0.44–1.00)
Creatinine, Ser: 0.87 mg/dL (ref 0.44–1.00)
GFR calc Af Amer: >60 mL/min (ref >60)
GFR calc Af Amer: >60 mL/min (ref >60)
GFR calc non Af Amer: >60 mL/min (ref >60)
GFR calc non Af Amer: >60 mL/min (ref >60)
Glucose, Bld: 125 mg/dL — ABNORMAL HIGH (ref 70–99)
Glucose, Bld: 215 mg/dL — ABNORMAL HIGH (ref 70–99)
Potassium: 3.6 mmol/L (ref 3.5–5.1)
Potassium: 4.1 mmol/L (ref 3.5–5.1)
Sodium: 133 mmol/L — ABNORMAL LOW (ref 135–145)
Sodium: 136 mmol/L (ref 135–145)

## 2020-05-14 LAB — MRSA PCR SCREENING: MRSA by PCR: NEGATIVE

## 2020-05-14 LAB — PROTIME-INR
INR: 1.2 (ref 0.8–1.2)
Prothrombin Time: 15.1 seconds (ref 11.4–15.2)

## 2020-05-14 MED ORDER — CLOPIDOGREL BISULFATE 75 MG PO TABS
75.0000 mg | ORAL_TABLET | Freq: Every day | ORAL | Status: DC
  Administered 2020-05-14 – 2020-05-16 (×3): 75 mg via ORAL
  Filled 2020-05-14 (×3): qty 1

## 2020-05-14 MED ORDER — HYDROMORPHONE HCL 1 MG/ML IJ SOLN
1.0000 mg | Freq: Once | INTRAMUSCULAR | Status: AC
  Administered 2020-05-14: 1 mg via INTRAVENOUS
  Filled 2020-05-14: qty 1

## 2020-05-14 MED ORDER — CHLORHEXIDINE GLUCONATE CLOTH 2 % EX PADS
6.0000 | MEDICATED_PAD | Freq: Every day | CUTANEOUS | Status: DC
  Administered 2020-05-14 – 2020-05-15 (×2): 6 via TOPICAL

## 2020-05-14 MED ORDER — SODIUM CHLORIDE 0.9% FLUSH: 10.0000 mL | INTRAVENOUS | Status: DC | PRN

## 2020-05-14 MED ORDER — POTASSIUM CHLORIDE CRYS ER 20 MEQ PO TBCR
40.0000 meq | EXTENDED_RELEASE_TABLET | Freq: Once | ORAL | Status: AC
  Administered 2020-05-14: 40 meq via ORAL
  Filled 2020-05-14: qty 2

## 2020-05-14 MED ORDER — HYDROMORPHONE HCL 1 MG/ML IJ SOLN
0.5000 mg | INTRAMUSCULAR | Status: DC | PRN
  Administered 2020-05-14: 0.5 mg via INTRAVENOUS
  Filled 2020-05-14 (×2): qty 0.5

## 2020-05-14 MED ORDER — LIDOCAINE HCL (PF) 1 % IJ SOLN
INTRAMUSCULAR | Status: AC
  Filled 2020-05-14: qty 5

## 2020-05-14 MED ORDER — SODIUM CHLORIDE 0.9 % IV SOLN: INTRAVENOUS | Status: AC

## 2020-05-14 NOTE — Consult Note (Addendum)
CT A/P revealed fairly extensive L RP bleed. Patient with 7/10 pain now and worsening. MAPs still high 41s s/p IVFs. Will move to 2H for closer monitoring. Needs additional access (has 20/22 ga PIVs) and will need A line for closer monitoring of BP. T&S active if needs transfusion. Recheck Hb 0400. DAPT d/c, accepting risk of issues with new stent vs ongoing bleed.

## 2020-05-14 NOTE — Consult Note (Addendum)
Mr. Desrosier without pain, dilaudid given at 0545, was not having relief with prn morphine. Ordered for ongoing prn dilaudid. MAPs much improved, >80, sBP 160s. Overall appearing much better. Initially had some mild tingling of fingers post A line placement, has since resolved. Repeat Hb with 1 gm drop (10.1->9.0), s/p 500 cc IVFs, may be somewhat dilutional as all cell lines down. Ordered to trend q4h for now. Hopefully can avoid repeat CT and transfusion if stabilizes.

## 2020-05-14 NOTE — Consult Note (Signed)
BP variable and not consistent on manual cuff. Both PIVs infiltrated. Veins small but IV team able to get RUE 20 ga midline. L radial A line placed and BP initially low but stabilized out at 130s/50s (MAPs 70s) after IVFs. Repeat CBC sent and pending. If continues to drop will get additional IV access and discuss contrasted CT to assess for active extravasation.

## 2020-05-14 NOTE — Procedures (Signed)
[place] b/l wrists and both arteries diminutive but L radial larger than R radial with doppler flow. A line placed with hydrophilic wire via seldinger into L wrist.

## 2020-05-14 NOTE — Progress Notes (Addendum)
Progress Note  Patient Name: [name] Date of Encounter: 05/14/2020  [facility] Cardiologist: Shelva Majestic, MD   Subjective   Left flank pain improved this morning.  Inpatient Medications    Scheduled Meds: . Chlorhexidine Gluconate Cloth  6 each Topical Daily  . cholecalciferol  1,000 Units Oral Q1200  . clopidogrel  75 mg Oral Daily  . docusate sodium  200 mg Oral QPM  . lidocaine (PF)      . pyridOXINE  100 mg Oral Q1200  . sodium chloride flush  3 mL Intravenous Q12H  . sodium chloride flush  3 mL Intravenous Q12H  . vitamin B-12  1,000 mcg Oral Q1200   Continuous Infusions: . sodium chloride     PRN Meds: sodium chloride, acetaminophen, albuterol, fluticasone, HYDROmorphone (DILAUDID) injection, nitroGLYCERIN, ondansetron (ZOFRAN) IV, sodium chloride flush, sodium chloride flush, valACYclovir   Vital Signs    Vitals:   05/14/20 0600 05/14/20 0700 05/14/20 0751 05/14/20 0808  BP:    (!) 142/48  Pulse: (!) 59 (!) 58  65  Resp: '10 12  18  '$ Temp:   98.1 F (36.7 C)   TempSrc:   Oral   SpO2: (!) 87% 99%  100%  Weight:      Height:        Intake/Output Summary (Last 24 hours) at 05/14/2020 0934 Last data filed at 05/14/2020 0928 Gross per 24 hour  Intake 320 ml  Output 1050 ml  Net -730 ml   Last 3 Weights 05/14/2020 05/13/2020 05/04/2020  Weight (lbs) 160 lb 15 oz 155 lb 157 lb 3.2 oz  Weight (kg) 73 kg 70.308 kg 71.305 kg      Telemetry    Normal sinus rhythm, occasional PVCs- Personally Reviewed  ECG      Physical Exam   GEN: No acute distress.   Neck: No JVD Cardiac: RRR, no murmurs, rubs, or gallops.  Respiratory: Clear to auscultation bilaterally. GI: Soft, nontender, non-distended  MS:  Bruising and hematoma noted at both groins, extending both medially and laterally from the access site.  Left groin bruising seems more extensive.  Both are tender to touch Neuro:  Nonfocal  Psych: Normal affect   Labs    High Sensitivity Troponin:   No results for input(s): TROPONINIHS in the last 720 hours.    Chemistry Recent Labs  Lab 05/07/20 1503 05/13/20 2333 05/14/20 0731  NA 137 133* 136  K 4.5 4.1 3.6  CL 100 105 106  CO2 23 21* 23  GLUCOSE 119* 215* 125*  BUN '23 19 13  '$ CREATININE 0.78 0.87 0.64  CALCIUM 9.0 7.6* 7.5*  GFRNONAA 75 >60 >60  GFRAA 86 >60 >60  ANIONGAP  --  7 7     Hematology Recent Labs  Lab 05/13/20 2333 05/14/20 0337 05/14/20 0731  WBC 13.8* 13.6* 11.7*  RBC 3.19* 3.17* 2.87*  HGB 9.9* 10.1* 9.0*  HCT 30.1* 30.6* 27.3*  MCV 94.4 96.5 95.1  MCH 31.0 31.9 31.4  MCHC 32.9 33.0 33.0  RDW 13.2 13.3 13.2  PLT 154 163 144*    BNPNo results for input(s): BNP, PROBNP in the last 168 hours.   DDimer No results for input(s): DDIMER in the last 168 hours.   Radiology    CT ABDOMEN PELVIS WO CONTRAST  Result Date: 05/14/2020 CLINICAL DATA:  Concern for retroperitoneal hematoma with recent bilateral femoral artery access. EXAM: CT ABDOMEN AND PELVIS WITHOUT CONTRAST TECHNIQUE: Multidetector CT imaging of the abdomen and  pelvis was performed following the standard protocol without IV contrast. COMPARISON:  PET-CT 02/02/2020, CT chest 01/07/2020, CT abdomen and pelvis 06/13/2019 FINDINGS: Lower chest: Chronic small left pleural effusion is similar to comparison. Some adjacent passive atelectatic changes. Additional bandlike areas of scarring and/or atelectasis in the lung bases. Normal heart size. No pericardial effusion. Hepatobiliary: Stable subcentimeter hypoattenuating focus in the anterior left lobe liver is unchanged from prior and statistically likely benign. No worrisome focal liver abnormality is seen. Layering hyperdense contrast media within the gallbladder compatible with vicarious extravasation of contrast. Otherwise normal. No visible calcified gallstones. No biliary ductal dilatation. Pancreas: Unremarkable. No pancreatic ductal dilatation or surrounding inflammatory changes. Spleen: Normal  in size without focal abnormality. Adrenals/Urinary Tract: Normal adrenal glands. Excreted contrast material noted with collecting system. Kidneys are normal size. No worrisome renal lesions. No urolithiasis or hydronephrosis. Urinary bladder contains dependently layering excreted contrast media as well without acute bladder abnormality. Small amount of stranding along the anterior bladder is more likely related to the retroperitoneal hemorrhage detailed below. Stomach/Bowel: Distal esophagus, stomach and duodenal sweep are unremarkable. No small bowel wall thickening or dilatation. No evidence of obstruction. The appendix is surgically absent. No colonic dilatation or wall thickening. Vascular/Lymphatic: Extensive atherosclerotic calcification throughout the native aorta and iliac arteries with distal aortobiiliac stenting. Luminal evaluation precluded in the absence of contrast media. Mild stranding is noted adjacent the right femoral artery access site with overlying compression dressing. More extensive stranding and indeed retroperitoneal hemorrhage is seen extending from the left femoral access site into the sub peritoneal space and space of Retzius anteriorly with additional intermediate attenuation fluid in the presacral space posteriorly and tracking along the left pelvic sidewall and iliac more superiorly. No suspicious or enlarged lymph nodes in the included lymphatic chains. Reproductive: Calcified uterine fibroids. No concerning adnexal lesions. Other: Stranding about the access sites as above. Left retroperitoneal hemorrhage, also as above. No bowel containing hernias. Musculoskeletal: Multilevel degenerative changes are present in the imaged portions of the spine. Stable Schmorl's node formations and endplate deformities [id number]. no new acute osseous abnormality. IMPRESSION: 1. Extensive stranding and retroperitoneal hemorrhage extending from the left femoral access site into the subperitoneal space  and Space of Retzius anteriorly, presacral space posteriorly and tracking along the left pelvic sidewall and iliac more superiorly. Overlying compression dressing is in place at this time. 2. Mild stranding adjacent the right femoral artery access site with overlying compression dressing. 3. Distal aortoiliac stenting. Luminal evaluation precluded in the absence of contrast media. Excreted contrast media within the gallbladder lumen and urinary collecting system likely related to recent procedure. 4. Chronic small left pleural effusion. 5. Calcified uterine fibroids. 6. Aortic Atherosclerosis (ICD10-I70.0). Electronically Signed   By: Lovena Le M.D.   On: 05/14/2020 01:51   PERIPHERAL VASCULAR CATHETERIZATION  Result Date: 05/13/2020  [id number] LOCATION:  FACILITY: [facility] PHYSICIAN: Quay Burow, M.D. 1946/07/18 DATE OF PROCEDURE:  05/13/2020 DATE OF DISCHARGE: PV Angiogram/Intervention History obtained from chart review.GABBRIELLA PRESSWOOD is a 74 y.o.  divorced, mother of 2, grandmother to grandchildren referred by Dr. Claiborne Billings for peripheral vascular evaluation because of lifestyle limiting claudication.I last saw her in the office 03/30/2020. She has a history of coronary artery disease status post bypass grafting 3 2008. She has COPD with 40 pack years tobacco abuse having quit 10 years ago as well as history of hypertension and hyperlipidemia. She's also had squamous cell carcinoma surgically removed with a VATS by Dr. Caffie Pinto 07/02/17 with some perioperative  A. fib. She denies respiratory shortness of breath. She does complain of somewhat limiting claudication. She did have remote Dopplers in 2011 revealed normal ABIs however most recent Dopplers performed 08/13/17 revealed a right ABI of 0.84 and a leftABI of .72.She did have mild to moderate iliac disease.  She is limited somewhat by shortness of breath from her COPD but she also has progressive lifestyle limiting claudication.  Dopplers performed  04/22/2020 revealed a right ABI of 1.01 a left of 0.83 with high-frequency signals in both iliac arteries.  She wishes to proceed with angiography and endovascular therapy for lifestyle limiting claudication Pre Procedure Diagnosis: Peripheral arterial disease/claudication Post Procedure Diagnosis: Peripheral arterial disease/claudication Operators: Dr. Quay Burow Procedures Performed:  1.  Ultrasound-guided right and left common femoral access  2.  Abdominal aortogram/bilateral iliac angiogram  3.  Diamondback orbital rotational atherectomy of the right and left common iliac artery ostia  4.  VBX covered stenting of the right and left common iliac artery ostia using "kissing stent technique" PROCEDURE DESCRIPTION: The patient was brought to the second floor [facility] cath lab in the the postabsorptive state. She was premedicated with IV Versed and fentanyl. Her right and left groins were prepped and shaved in usual sterile fashion. Xylocaine 1% was used for local anesthesia. A 5 French sheath was inserted into the right and left common femoral arteries using standard Seldinger technique.  Ultrasound was used to identify the vessel and obtain access.  A digital image of the ultrasound was captured and placed in the patient's chart.  A 5 French pigtail catheters placed in the distal abdominal aorta.  Distal abdominal aortography and bilateral iliac angiography were performed.  Isovue dye was used for the entirety of the case.  Retrograde aortic pressures monitored during the case.  Angiographic Data: 1: Abdominal aorta-moderately atherosclerotic 2: Left lower extremity-90% calcified ostial left common iliac artery stenosis, 40 to 50% calcified distal left common iliac artery stenosis. 3: Right lower extremity-80% calcified ostial right common iliac artery stenosis   Ms. Tipping has high-grade calcified ostial bilateral iliac stenoses responsible for her claudication.  We will proceed with diamondback  orbital rotational atherectomy followed by VBX covered stenting using "kissing stent technique". Procedure Description: The 5 French sheath in both common femoral arteries which were exchanged over an 035 wires for 7 French Brite tip sheaths.  The patient received a total of 12,000 as of heparin with an ACT of 329.  A total of 175 cc of contrast was administered to the patient. I initially crossed the ostium of the left common iliac artery with an 014 Viper wire and performed orbital atherectomy with a 2 mm bur up 220,000 RPMs.  I then performed the same procedure on the ostium of the right common iliac artery.  I then placed 2035 versa core wires across the iliac ostia and a 8 mm x 39 mm VBX stent across the ostium of the left common iliac artery, 8 mm x 29 mm VBX stent across the ostium of the right.  I carefully aligned them so they were above the stenoses rebuilding the carina and deployed at 8 atm for approximately 30 seconds (7.5 mm).  This did result in some abdominal pain which was why I did not go up to nominal pressures which would have been 8 mm).  Completion angiography with the pigtail catheter revealed reduction of 90% bilateral calcified iliac artery stenosis 0% residual.  Patient tolerated procedure well.  The bright tip sheaths were  then exchanged over a 3 5 wire for short 7 French sheath which were secured in place.  Patient received 300 mg of p.o. Plavix.  She left the lab in stable condition. Final Impression: Successful diamondback orbital rotational atherectomy, PTA and covered stenting using VBX covered stents using kissing stent technique.  The sheath will be removed once ACT falls below 170 and pressure held.  Patient be hydrated overnight.  Coumadin will be restarted in the morning.  She will be treated with low-dose aspirin Plavix and Coumadin for 1 month after which aspirin will be discontinued.  She will be discharged in the morning as she remains clinically stable overnight.  We will  obtain aortoiliac Doppler studies in our [facility] line office next week and I will see her back 2 weeks thereafter. Quay Burow. MD, [facility] 05/13/2020 1:43 PM    Cardiac Studies   PV procedure results reviewed  Patient Profile     74 y.o. female who received bilateral kissing iliac stents for claudication  Assessment & Plan    PAD: Status post intervention, with left retroperitoneal bleed.  Blood pressure seems to have stabilized.  We will check 1 more CBC at noon today.  If stable, will hold off on any further CBC until tomorrow.  Should be able to discontinue arterial line at that point.  We will start to mobilize her somewhat.  For now to start with having the head of her bed up.  Continue to use Dilaudid for pain control.  This seemed to work better than morphine according to the notes.  Paroxysmal atrial fibrillation/paroxysmal atrial flutter: She has been on Coumadin for this in the past.  I discussed this with Dr. Gwenlyn Found.  She is in sinus rhythm.  We will hold off on restarting Coumadin for about a week.  Aspirin and Plavix were both stopped last night after the bleeding issue started.  Will restart clopidogrel today.  No aspirin.  She will add back warfarin in a week.  If her hemoglobin and blood pressure are stable later this afternoon, could consider moving her back to 6 E.  Discharge will be dependent on pain control, and how easily we can get her ambulating.  I discussed the plan at length with her daughter.  She will be in to visit later today.  For questions or updates, please contact [place] Please consult [email or web address] for contact info under        Signed, Larae Grooms, MD  05/14/2020, 9:34 AM    Hbg stable.  A-line removed.  Patient still having some flank pain but improved.  BP stable.  Will continue to monitor in 2H.  Check CBC in AM. Will have to get her up with PT and see how she feels before considering discharge.  Continue clopidogrel with plans to restart  Coumadin in a week, and not use aspirin at all at this time.    Jettie Booze, MD

## 2020-05-14 NOTE — Significant Event (Signed)
Rapid Response Event Note  Assisted with transfer of pt from 6E30 to 2H07 d/t RP bleed with 4gr hbg drop and SBP 90-110s. Pt transported with 6E RN, MD, and RRT.     Dillard Essex

## 2020-05-14 NOTE — Consult Note (Signed)
Hb stable (10.1) at 0337 from (9.9) at 2333.

## 2020-05-14 NOTE — Consult Note (Signed)
MAPs mid 50s, 500 cc IVFs over 2h, EF preserved. Hb with 4 gm drop from prior (bl 13.1-13.6), today (9.9) post procedure. Given hypotension, back pain different from bl, recent procedure, and on DAPT with new onset anemia will get non con CT A/P to assess for RP/femoral bleed.

## 2020-05-14 NOTE — Telephone Encounter (Signed)
Spoke with patient regarding appointment for aorta/iliac doppler scheduled 05/27/20 at 1:00pm----pt advised not to eat ,uch  Prior to her appointment.  I will also mail the information to the patient.  She voiced her understanding.

## 2020-05-15 LAB — BASIC METABOLIC PANEL
Anion gap: 4 — ABNORMAL LOW (ref 5–15)
BUN: 11 mg/dL (ref 8–23)
CO2: 25 mmol/L (ref 22–32)
Calcium: 7.9 mg/dL — ABNORMAL LOW (ref 8.9–10.3)
Chloride: 107 mmol/L (ref 98–111)
Creatinine, Ser: 0.53 mg/dL (ref 0.44–1.00)
GFR calc Af Amer: >60 mL/min (ref >60)
GFR calc non Af Amer: >60 mL/min (ref >60)
Glucose, Bld: 116 mg/dL — ABNORMAL HIGH (ref 70–99)
Potassium: 4.1 mmol/L (ref 3.5–5.1)
Sodium: 136 mmol/L (ref 135–145)

## 2020-05-15 LAB — CBC
HCT: 26.5 % — ABNORMAL LOW (ref 36.0–46.0)
Hemoglobin: 8.6 g/dL — ABNORMAL LOW (ref 12.0–15.0)
MCH: 31.3 pg (ref 26.0–34.0)
MCHC: 32.5 g/dL (ref 30.0–36.0)
MCV: 96.4 fL (ref 80.0–100.0)
Platelets: 124 10*3/uL — ABNORMAL LOW (ref 150–400)
RBC: 2.75 MIL/uL — ABNORMAL LOW (ref 3.87–5.11)
RDW: 13.2 % (ref 11.5–15.5)
WBC: 9.7 10*3/uL (ref 4.0–10.5)
nRBC: 0 % (ref 0.0–0.2)

## 2020-05-15 NOTE — Progress Notes (Signed)
Pt w/BP's 141/61, HR 72, then 152/53, HR 79. Pt c/o headache.  Pt given ordered tylenol. Pt and daughter stating BP and HR are very high for her and adamantly request RN call MD.  MD on call paged and call returned promptly. No new orders received. Pt and family updated.  Will continue to monitor pt closely.

## 2020-05-15 NOTE — Progress Notes (Signed)
Progress Note  Patient Name: [name] Date of Encounter: 05/15/2020  Primary Cardiologist:   Shelva Majestic, MD   Subjective   Doing better.  Pain is improved.  Ambulated around unit.    Inpatient Medications    Scheduled Meds: . Chlorhexidine Gluconate Cloth  6 each Topical Daily  . cholecalciferol  1,000 Units Oral Q1200  . clopidogrel  75 mg Oral Daily  . docusate sodium  200 mg Oral QPM  . pyridOXINE  100 mg Oral Q1200  . sodium chloride flush  3 mL Intravenous Q12H  . sodium chloride flush  3 mL Intravenous Q12H  . vitamin B-12  1,000 mcg Oral Q1200   Continuous Infusions: . sodium chloride     PRN Meds: sodium chloride, acetaminophen, albuterol, fluticasone, HYDROmorphone (DILAUDID) injection, nitroGLYCERIN, ondansetron (ZOFRAN) IV, sodium chloride flush, sodium chloride flush, valACYclovir   Vital Signs    Vitals:   05/15/20 0400 05/15/20 0500 05/15/20 0600 05/15/20 0700  BP: (!) 93/37 (!) 95/40 (!) 96/41 (!) 114/48  Pulse: (!) 59 (!) 55 (!) 57 70  Resp: _0 Temp:      TempSrc:      SpO2: 100% 100% 100% 97%  Weight:      Height:        Intake/Output Summary (Last 24 hours) at 05/15/2020 0755 Last data filed at 05/15/2020 0700 Gross per 24 hour  Intake 1040 ml  Output 2050 ml  Net -1010 ml   Filed Weights   05/13/20 1022 05/14/20 0400  Weight: 70.3 kg 73 kg    Telemetry    NSR - Personally Reviewed  ECG    NA - Personally Reviewed  Physical Exam   GEN: No acute distress.   Neck: No  JVD Cardiac: RRR, no murmurs, rubs, or gallops.  Respiratory: Clear  to auscultation bilaterally. GI: Soft, nontender, non-distended  MS: No  edema; No deformity. Bilateral femoral access sites without bleeding but with ecchymosis  Neuro:  Nonfocal  Psych: Normal affect   Labs    Chemistry Recent Labs  Lab 05/13/20 2333 05/14/20 0731 05/15/20 0521  NA 133* 136 136  K 4.1 3.6 4.1  CL 105 106 107  CO2 21* 23 25  GLUCOSE 215* 125* 116*    BUN _1 CREATININE 0.87 0.64 0.53  CALCIUM 7.6* 7.5* 7.9*  GFRNONAA >60 >60 >60  GFRAA >60 >60 >60  ANIONGAP 7 7 4*     Hematology Recent Labs  Lab 05/14/20 0731 05/14/20 1125 05/15/20 0521  WBC 11.7* 12.2* 9.7  RBC 2.87* 2.90* 2.75*  HGB 9.0* 9.2* 8.6*  HCT 27.3* 27.6* 26.5*  MCV 95.1 95.2 96.4  MCH 31.4 31.7 31.3  MCHC 33.0 33.3 32.5  RDW 13.2 13.4 13.2  PLT 144* 138* 124*    Cardiac EnzymesNo results for input(s): TROPONINI in the last 168 hours. No results for input(s): TROPIPOC in the last 168 hours.   BNPNo results for input(s): BNP, PROBNP in the last 168 hours.   DDimer No results for input(s): DDIMER in the last 168 hours.   Radiology    CT ABDOMEN PELVIS WO CONTRAST  Result Date: 05/14/2020 CLINICAL DATA:  Concern for retroperitoneal hematoma with recent bilateral femoral artery access. EXAM: CT ABDOMEN AND PELVIS WITHOUT CONTRAST TECHNIQUE: Multidetector CT imaging of the abdomen and pelvis was performed following the standard protocol without IV contrast. COMPARISON:  PET-CT 02/02/2020, CT chest 01/07/2020, CT abdomen and pelvis 06/13/2019 FINDINGS: Lower chest:  Chronic small left pleural effusion is similar to comparison. Some adjacent passive atelectatic changes. Additional bandlike areas of scarring and/or atelectasis in the lung bases. Normal heart size. No pericardial effusion. Hepatobiliary: Stable subcentimeter hypoattenuating focus in the anterior left lobe liver is unchanged from prior and statistically likely benign. No worrisome focal liver abnormality is seen. Layering hyperdense contrast media within the gallbladder compatible with vicarious extravasation of contrast. Otherwise normal. No visible calcified gallstones. No biliary ductal dilatation. Pancreas: Unremarkable. No pancreatic ductal dilatation or surrounding inflammatory changes. Spleen: Normal in size without focal abnormality. Adrenals/Urinary Tract: Normal adrenal glands. Excreted  contrast material noted with collecting system. Kidneys are normal size. No worrisome renal lesions. No urolithiasis or hydronephrosis. Urinary bladder contains dependently layering excreted contrast media as well without acute bladder abnormality. Small amount of stranding along the anterior bladder is more likely related to the retroperitoneal hemorrhage detailed below. Stomach/Bowel: Distal esophagus, stomach and duodenal sweep are unremarkable. No small bowel wall thickening or dilatation. No evidence of obstruction. The appendix is surgically absent. No colonic dilatation or wall thickening. Vascular/Lymphatic: Extensive atherosclerotic calcification throughout the native aorta and iliac arteries with distal aortobiiliac stenting. Luminal evaluation precluded in the absence of contrast media. Mild stranding is noted adjacent the right femoral artery access site with overlying compression dressing. More extensive stranding and indeed retroperitoneal hemorrhage is seen extending from the left femoral access site into the sub peritoneal space and space of Retzius anteriorly with additional intermediate attenuation fluid in the presacral space posteriorly and tracking along the left pelvic sidewall and iliac more superiorly. No suspicious or enlarged lymph nodes in the included lymphatic chains. Reproductive: Calcified uterine fibroids. No concerning adnexal lesions. Other: Stranding about the access sites as above. Left retroperitoneal hemorrhage, also as above. No bowel containing hernias. Musculoskeletal: Multilevel degenerative changes are present in the imaged portions of the spine. Stable Schmorl's node formations and endplate deformities [id number]. no new acute osseous abnormality. IMPRESSION: 1. Extensive stranding and retroperitoneal hemorrhage extending from the left femoral access site into the subperitoneal space and Space of Retzius anteriorly, presacral space posteriorly and tracking along the left  pelvic sidewall and iliac more superiorly. Overlying compression dressing is in place at this time. 2. Mild stranding adjacent the right femoral artery access site with overlying compression dressing. 3. Distal aortoiliac stenting. Luminal evaluation precluded in the absence of contrast media. Excreted contrast media within the gallbladder lumen and urinary collecting system likely related to recent procedure. 4. Chronic small left pleural effusion. 5. Calcified uterine fibroids. 6. Aortic Atherosclerosis (ICD10-I70.0). Electronically Signed   By: Lovena Le M.D.   On: 05/14/2020 01:51   PERIPHERAL VASCULAR CATHETERIZATION  Result Date: 05/13/2020  [id number] LOCATION:  FACILITY: [facility] PHYSICIAN: Quay Burow, M.D. Nov 23, 1945 DATE OF PROCEDURE:  05/13/2020 DATE OF DISCHARGE: PV Angiogram/Intervention History obtained from chart review.[name] is a 74 y.o.  divorced, mother of 2, grandmother to grandchildren referred by Dr. Claiborne Billings for peripheral vascular evaluation because of lifestyle limiting claudication.I last saw her in the office 03/30/2020. She has a history of coronary artery disease status post bypass grafting 3 2008. She has COPD with 40 pack years tobacco abuse having quit 10 years ago as well as history of hypertension and hyperlipidemia. She's also had squamous cell carcinoma surgically removed with a VATS by Dr. Caffie Pinto 07/02/17 with some perioperative A. fib. She denies respiratory shortness of breath. She does complain of somewhat limiting claudication. She did have remote Dopplers in 2011 revealed normal ABIs  however most recent Dopplers performed 08/13/17 revealed a right ABI of 0.84 and a leftABI of .72.She did have mild to moderate iliac disease.  She is limited somewhat by shortness of breath from her COPD but she also has progressive lifestyle limiting claudication.  Dopplers performed 04/22/2020 revealed a right ABI of 1.01 a left of 0.83 with high-frequency signals in both iliac  arteries.  She wishes to proceed with angiography and endovascular therapy for lifestyle limiting claudication Pre Procedure Diagnosis: Peripheral arterial disease/claudication Post Procedure Diagnosis: Peripheral arterial disease/claudication Operators: Dr. Quay Burow Procedures Performed:  1.  Ultrasound-guided right and left common femoral access  2.  Abdominal aortogram/bilateral iliac angiogram  3.  Diamondback orbital rotational atherectomy of the right and left common iliac artery ostia  4.  VBX covered stenting of the right and left common iliac artery ostia using "kissing stent technique" PROCEDURE DESCRIPTION: The patient was brought to the second floor [facility] cath lab in the the postabsorptive state. She was premedicated with IV Versed and fentanyl. Her right and left groins were prepped and shaved in usual sterile fashion. Xylocaine 1% was used for local anesthesia. A 5 French sheath was inserted into the right and left common femoral arteries using standard Seldinger technique.  Ultrasound was used to identify the vessel and obtain access.  A digital image of the ultrasound was captured and placed in the patient's chart.  A 5 French pigtail catheters placed in the distal abdominal aorta.  Distal abdominal aortography and bilateral iliac angiography were performed.  Isovue dye was used for the entirety of the case.  Retrograde aortic pressures monitored during the case.  Angiographic Data: 1: Abdominal aorta-moderately atherosclerotic 2: Left lower extremity-90% calcified ostial left common iliac artery stenosis, 40 to 50% calcified distal left common iliac artery stenosis. 3: Right lower extremity-80% calcified ostial right common iliac artery stenosis   Ms. Birenbaum has high-grade calcified ostial bilateral iliac stenoses responsible for her claudication.  We will proceed with diamondback orbital rotational atherectomy followed by VBX covered stenting using "kissing stent technique".  Procedure Description: The 5 French sheath in both common femoral arteries which were exchanged over an 035 wires for 7 French Brite tip sheaths.  The patient received a total of 12,000 as of heparin with an ACT of 329.  A total of 175 cc of contrast was administered to the patient. I initially crossed the ostium of the left common iliac artery with an 014 Viper wire and performed orbital atherectomy with a 2 mm bur up 220,000 RPMs.  I then performed the same procedure on the ostium of the right common iliac artery.  I then placed 2035 versa core wires across the iliac ostia and a 8 mm x 39 mm VBX stent across the ostium of the left common iliac artery, 8 mm x 29 mm VBX stent across the ostium of the right.  I carefully aligned them so they were above the stenoses rebuilding the carina and deployed at 8 atm for approximately 30 seconds (7.5 mm).  This did result in some abdominal pain which was why I did not go up to nominal pressures which would have been 8 mm).  Completion angiography with the pigtail catheter revealed reduction of 90% bilateral calcified iliac artery stenosis 0% residual.  Patient tolerated procedure well.  The bright tip sheaths were then exchanged over a 3 5 wire for short 7 French sheath which were secured in place.  Patient received 300 mg of p.o. Plavix.  She left the lab in stable condition. Final Impression: Successful diamondback orbital rotational atherectomy, PTA and covered stenting using VBX covered stents using kissing stent technique.  The sheath will be removed once ACT falls below 170 and pressure held.  Patient be hydrated overnight.  Coumadin will be restarted in the morning.  She will be treated with low-dose aspirin Plavix and Coumadin for 1 month after which aspirin will be discontinued.  She will be discharged in the morning as she remains clinically stable overnight.  We will obtain aortoiliac Doppler studies in our [facility] line office next week and I will see her back 2  weeks thereafter. Quay Burow. MD, [facility] 05/13/2020 1:43 PM    Cardiac Studies   See PV procedure results.    CT:  IMPRESSION: 1. Extensive stranding and retroperitoneal hemorrhage extending from the left femoral access site into the subperitoneal space and Space of Retzius anteriorly, presacral space posteriorly and tracking along the left pelvic sidewall and iliac more superiorly. Overlying compression dressing is in place at this time. 2. Mild stranding adjacent the right femoral artery access site with overlying compression dressing. 3. Distal aortoiliac stenting. Luminal evaluation precluded in the absence of contrast media. Excreted contrast media within the gallbladder lumen and urinary collecting system likely related to recent procedure. 4. Chronic small left pleural effusion. 5. Calcified uterine fibroids. 6. Aortic Atherosclerosis (ICD10-I70.0).   Patient Profile     73 y.o. female 74 y.o. female who received bilateral kissing iliac stents for claudication  Assessment & Plan     PAD:  Status post retroperitoneal bleed.  Started Plavix.  Avoiding warfarin for now.  ASA stopped.    Paroxysmal atrial fibrillation/paroxysmal atrial flutter:   Discussed with Dr. Gwenlyn Found previously.  No warfarin for one week post bleed.    Bleed.   Hgb down to 8.6.   Will watch one more day in the hospital.  Ambulate.  Transfer to 4e.      For questions or updates, please contact [place] Please consult [email or web address] for contact info under Cardiology/STEMI.   Signed, Minus Breeding, MD  05/15/2020, 7:55 AM

## 2020-05-15 NOTE — Progress Notes (Signed)
Pt expressing continued concern regarding her BP and HR.  BP set to cycle every hour and pt reassured she will be closely monitored.

## 2020-05-16 ENCOUNTER — Other Ambulatory Visit: Payer: Self-pay | Admitting: Nurse Practitioner

## 2020-05-16 DIAGNOSIS — D62 Acute posthemorrhagic anemia: Secondary | ICD-10-CM

## 2020-05-16 DIAGNOSIS — K661 Hemoperitoneum: Secondary | ICD-10-CM

## 2020-05-16 LAB — CBC
HCT: 27.9 % — ABNORMAL LOW (ref 36.0–46.0)
Hemoglobin: 9.4 g/dL — ABNORMAL LOW (ref 12.0–15.0)
MCH: 31.5 pg (ref 26.0–34.0)
MCHC: 33.7 g/dL (ref 30.0–36.0)
MCV: 93.6 fL (ref 80.0–100.0)
Platelets: 145 10*3/uL — ABNORMAL LOW (ref 150–400)
RBC: 2.98 MIL/uL — ABNORMAL LOW (ref 3.87–5.11)
RDW: 13.2 % (ref 11.5–15.5)
WBC: 9.7 10*3/uL (ref 4.0–10.5)
nRBC: 0.2 % (ref 0.0–0.2)

## 2020-05-16 MED ORDER — WARFARIN SODIUM 2.5 MG PO TABS: 1.2500 mg | ORAL_TABLET | ORAL | Status: DC

## 2020-05-16 MED ORDER — BISACODYL 5 MG PO TBEC
10.0000 mg | DELAYED_RELEASE_TABLET | Freq: Every day | ORAL | Status: DC | PRN
  Administered 2020-05-16: 10 mg via ORAL
  Filled 2020-05-16: qty 2

## 2020-05-16 MED ORDER — METOPROLOL SUCCINATE ER 25 MG PO TB24
25.0000 mg | ORAL_TABLET | Freq: Every day | ORAL | Status: DC
  Administered 2020-05-16: 25 mg via ORAL
  Filled 2020-05-16: qty 1

## 2020-05-16 MED ORDER — CLOPIDOGREL BISULFATE 75 MG PO TABS: 75.0000 mg | ORAL_TABLET | Freq: Every day | ORAL | 6 refills | Status: DC

## 2020-05-16 NOTE — Discharge Summary (Addendum)
Discharge Summary    Patient ID: [name] MRN: [id number]; DOB: 1946/10/23  Admit date: 05/13/2020 Discharge date: 05/16/2020  Primary Care Provider: [name], MD  Primary Cardiologist: Shelva Majestic, MD  Primary Electrophysiologist:  None   Discharge Diagnoses    Principal Problem:   Claudication in peripheral vascular disease [facility])  **s/p bilateral ostial common iliac artery stenting this admission.  Active Problems:   PAD (peripheral artery disease) (HCC)   Retroperitoneal hematoma   Acute blood loss anemia  **In setting of Left retroperitoneal bleed.   Atherosclerosis of native coronary artery of native heart with stable angina pectoris ([place])   Essential hypertension   Long term current use of anticoagulant therapy   Hyperlipidemia LDL goal <70   PAF (paroxysmal atrial fibrillation) (HCC)   Paroxysmal atrial flutter (HCC)   Diagnostic Studies/Procedures    Peripheral Vascular Angiography & Stenting 7.1.2021  Angiographic Data:    1: Abdominal aorta-moderately atherosclerotic 2: Left lower extremity-90% calcified ostial left common iliac artery stenosis, 40 to 50% calcified distal left common iliac artery stenosis. 3: Right lower extremity-80% calcified ostial right common iliac artery stenosis   IMPRESSION: [name] has high-grade calcified ostial bilateral iliac stenoses responsible for her claudication.  We will proceed with diamondback orbital rotational atherectomy followed by VBX covered stenting using "kissing stent technique".  Procedures Performed:               1.  Ultrasound-guided right and left common femoral access               2.  Abdominal aortogram/bilateral iliac angiogram               3.  Diamondback orbital rotational atherectomy of the right and left common iliac artery ostia               4.  VBX covered stenting of the right and left common iliac artery ostia using "kissing stent technique"  _____________   History of Present  Illness     [name] is a 74 y.o. female with w/ a h/o CAD s/p prior CABG, PAF/flutter on coumadin, HTN, HL, remote tobacco abuse, COPD, and PAD with claudication.  She was recently evaluated with ABI's in the setting of lifestyle limiting claudication and was found to have an ABI of 1.01 on the right and 0.83 on the left.  High-frequency signals were noted in bilateral iliac arteries.  Decision was made to pursue peripheral angiography.  Hospital Course     Consultants: None   Pt presented to the [facility] peripheral vascular laboratory on 05/13/2020 and underwent peripheral angiography revealing severe bilateral ostial common iliac disease as outlined above.  She then underwent successful diamondback orbital rotational atherectomy followed by VBX covered stenting using a "kissing stent technique."  Ms. Faucett tolerated the procedure well but post-procedure she was noted to have relative hypotension with blood pressures in the 90's.  She also complained of mild lower back pain.  CBC was evaluated and returned with a drop in H/H to 9.9/30.1 (13.4/39.5 pre-procedure).  CT of the abdomen and pelvis was performed and revealed extensive stranding and retroperitoneal hemorrhage extending form the left femoral access site into the subperitoneal space, space of Retzius anteriorly, presacral space posteriorly, and tracking along the left pelvic sidewall and iliac more superiorly.  Mild stranding adjacent to the right femoral artery access site was also noted.  In this setting, she was transferred to the ICU and treated with IVF.  She did complain of groin and back pain, which was treated w/ prn dilaudid.  Serial H/H were followed and remained stable, thus she did not require transfusion.  BPs improved with IVF and home antihypertensives were held.  She has remained hemodynamically stable over the past 48 hrs and H/H remain stable and slightly improved at 9.4/27.9 this morning (nadir 806/26.5 on 05/15/2020).   She cont to have bilateral groin tenderness however, the sites are without bleeding or bruits.  Both sites are soft.  She has ambulated without difficulty and is felt to be stable for discharge this AM.  In the setting of L RP bleed, we have discontinued aspirin, and her warfarin therapy is currently on hold with a plan to resume on 7/9.  She will be discharged on plavix 75mg  daily and I have arranged for a follow-up CBC on 7/8.  Did the patient have an acute coronary syndrome (MI, NSTEMI, STEMI, etc) this admission?:  No                               Did the patient have a percutaneous coronary intervention (stent / angioplasty)?:  No.   _____________  Discharge Vitals Blood pressure (!) 157/70, pulse 71, temperature 97.6 F (36.4 C), temperature source Oral, resp. rate 15, height 5\' 3"  (1.6 m), weight 73 kg, SpO2 97 %.  Filed Weights   05/13/20 1022 05/14/20 0400  Weight: 70.3 kg 73 kg    Labs & Radiologic Studies    CBC Recent Labs    05/15/20 0521 05/16/20 0540  WBC 9.7 9.7  HGB 8.6* 9.4*  HCT 26.5* 27.9*  MCV 96.4 93.6  PLT 124* 841*   Basic Metabolic Panel Recent Labs    05/14/20 0731 05/15/20 0521  NA 136 136  K 3.6 4.1  CL 106 107  CO2 23 25  GLUCOSE 125* 116*  BUN 13 11  CREATININE 0.64 0.53  CALCIUM 7.5* 7.9*  ____________  CT ABDOMEN PELVIS WO CONTRAST  Result Date: 05/14/2020 CLINICAL DATA:  Concern for retroperitoneal hematoma with recent bilateral femoral artery access. EXAM: CT ABDOMEN AND PELVIS WITHOUT CONTRAST TECHNIQUE: Multidetector CT imaging of the abdomen and pelvis was performed following the standard protocol without IV contrast. COMPARISON:  PET-CT 02/02/2020, CT chest 01/07/2020, CT abdomen and pelvis 06/13/2019 FINDINGS: Lower chest: Chronic small left pleural effusion is similar to comparison. Some adjacent passive atelectatic changes. Additional bandlike areas of scarring and/or atelectasis in the lung bases. Normal heart size. No pericardial  effusion. Hepatobiliary: Stable subcentimeter hypoattenuating focus in the anterior left lobe liver is unchanged from prior and statistically likely benign. No worrisome focal liver abnormality is seen. Layering hyperdense contrast media within the gallbladder compatible with vicarious extravasation of contrast. Otherwise normal. No visible calcified gallstones. No biliary ductal dilatation. Pancreas: Unremarkable. No pancreatic ductal dilatation or surrounding inflammatory changes. Spleen: Normal in size without focal abnormality. Adrenals/Urinary Tract: Normal adrenal glands. Excreted contrast material noted with collecting system. Kidneys are normal size. No worrisome renal lesions. No urolithiasis or hydronephrosis. Urinary bladder contains dependently layering excreted contrast media as well without acute bladder abnormality. Small amount of stranding along the anterior bladder is more likely related to the retroperitoneal hemorrhage detailed below. Stomach/Bowel: Distal esophagus, stomach and duodenal sweep are unremarkable. No small bowel wall thickening or dilatation. No evidence of obstruction. The appendix is surgically absent. No colonic dilatation or wall thickening. Vascular/Lymphatic: Extensive atherosclerotic calcification throughout the native  aorta and iliac arteries with distal aortobiiliac stenting. Luminal evaluation precluded in the absence of contrast media. Mild stranding is noted adjacent the right femoral artery access site with overlying compression dressing. More extensive stranding and indeed retroperitoneal hemorrhage is seen extending from the left femoral access site into the sub peritoneal space and space of Retzius anteriorly with additional intermediate attenuation fluid in the presacral space posteriorly and tracking along the left pelvic sidewall and iliac more superiorly. No suspicious or enlarged lymph nodes in the included lymphatic chains. Reproductive: Calcified uterine  fibroids. No concerning adnexal lesions. Other: Stranding about the access sites as above. Left retroperitoneal hemorrhage, also as above. No bowel containing hernias. Musculoskeletal: Multilevel degenerative changes are present in the imaged portions of the spine. Stable Schmorl's node formations and endplate deformities [id number]. no new acute osseous abnormality. IMPRESSION: 1. Extensive stranding and retroperitoneal hemorrhage extending from the left femoral access site into the subperitoneal space and Space of Retzius anteriorly, presacral space posteriorly and tracking along the left pelvic sidewall and iliac more superiorly. Overlying compression dressing is in place at this time. 2. Mild stranding adjacent the right femoral artery access site with overlying compression dressing. 3. Distal aortoiliac stenting. Luminal evaluation precluded in the absence of contrast media. Excreted contrast media within the gallbladder lumen and urinary collecting system likely related to recent procedure. 4. Chronic small left pleural effusion. 5. Calcified uterine fibroids. 6. Aortic Atherosclerosis (ICD10-I70.0). Electronically Signed   By: Lovena Le M.D.   On: 05/14/2020 01:51   Disposition   Pt is being discharged home today in good condition.  Follow-up Plans & Appointments     Follow-up Information     Lorretta Harp, MD Follow up on 06/01/2020.   Specialties: Cardiology, Radiology Why: 1:30 PM Contact information: [address] [place] [address] [place] Follow up on 05/27/2020.   Specialty: Cardiology Why: 1:15 PM for lower extremity doppler studies. Contact information: [address] [id number] [place] [phone]        [facility] Follow up on 05/20/2020.   Specialty: Cardiology Why: follow up lab only - complete blood count - between 8a &  3p. Contact information: [address] [place] [phone]                  Discharge Medications   Allergies as of 05/16/2020       Reactions   Amiodarone Other (See Comments)   angioedema   Clindamycin/lincomycin Swelling   TROUBLE SWALLOWING......SEVERE CHEST PAIN   Doxycycline Other (See Comments)   blistering   Sulfa Antibiotics Photosensitivity, Rash, Other (See Comments)   Red, burning rash & paralysis Burning Rash   Lipitor [atorvastatin] Other (See Comments)   MYALGIAS LEG PAIN   Phenergan [promethazine Hcl] Other (See Comments)   Nervous Leg / Restless Leg Syndrome   Reclast [zoledronic Acid] Other (See Comments)   Flu symptoms - made pt very sick, and had inflammation  In her eye   Ketorolac Nausea Only   headache   Diltiazem Other (See Comments)   Weakness on oral Dilt   Latex Rash        Medication List     TAKE these medications    acetaminophen 500 MG tablet Commonly known as: TYLENOL Take 1,000 mg by mouth every 8 (eight) hours as needed (pain.).   albuterol  108 (90 Base) MCG/ACT inhaler Commonly known as: Ventolin HFA Inhale 2 puffs into the lungs every 4 (four) hours as needed for wheezing or shortness of breath. Inhale 1 to 2 puffs as needed before exercise.   CALTRATE 600+D3 PO Take 1 tablet by mouth daily at 12 noon.   cholecalciferol 25 MCG (1000 UNIT) tablet Commonly known as: VITAMIN D Take 1,000 Units by mouth daily at 12 noon.   clopidogrel 75 MG tablet Commonly known as: PLAVIX Take 1 tablet (75 mg total) by mouth daily. Start taking on: May 17, 2020   CoQ10 100 MG Caps Take 100 mg by mouth daily at 12 noon.   docusate sodium 100 MG capsule Commonly known as: COLACE Take 200 mg by mouth every evening.   fluorometholone 0.1 % ophthalmic suspension Commonly known as: FML SMARTSIG:In Eye(s)   fluticasone 50 MCG/ACT nasal spray Commonly known as: FLONASE Place 1 spray into  both nostrils at bedtime as needed for allergies or rhinitis.   hydrocortisone valerate cream 0.2 % Commonly known as: WESTCORT Apply 1 application topically 3 (three) times a week. On random days - for eczema in ear   Incruse Ellipta 62.5 MCG/INH Aepb Generic drug: umeclidinium bromide Inhale 1 puff into the lungs daily. What changed: when to take this   isosorbide mononitrate 30 MG 24 hr tablet Commonly known as: IMDUR Take 1 tablet (30 mg total) by mouth 2 (two) times daily.   lisinopril 20 MG tablet Commonly known as: ZESTRIL TAKE 1 TABLET(20 MG) BY MOUTH TWICE DAILY What changed: See the new instructions.   metoprolol succinate 25 MG 24 hr tablet Commonly known as: TOPROL-XL Take 25 mg by mouth daily.   metoprolol tartrate 25 MG tablet Commonly known as: LOPRESSOR Take 25 mg by mouth daily as needed (afib).   nitroGLYCERIN 0.4 MG SL tablet Commonly known as: NITROSTAT Place 1 tablet (0.4 mg total) under the tongue every 5 (five) minutes x 3 doses as needed for chest pain.   NON FORMULARY Place 1 drop into both eyes in the morning and at bedtime.   pyridOXINE 100 MG tablet Commonly known as: VITAMIN B-6 Take 100 mg by mouth daily at 12 noon.   rosuvastatin 10 MG tablet Commonly known as: CRESTOR TAKE 1 TABLET(10 MG) BY MOUTH DAILY What changed: See the new instructions.   Valtrex 1000 MG tablet Generic drug: valACYclovir Take 2,000 mg by mouth daily as needed (fever blisters/cold sores.).   vitamin B-12 500 MCG tablet Commonly known as: CYANOCOBALAMIN Take 1,000 mcg by mouth daily at 12 noon.   warfarin 2.5 MG tablet Commonly known as: COUMADIN Take as directed. If you are unsure how to take this medication, talk to your nurse or doctor. Original instructions: Take 0.5-1 tablets (1.25-2.5 mg total) by mouth See admin instructions. **HOLD UNTIL 7/9, THEN RESUME AT PRIOR DOSE** Take 0.5 tablet (1.25 mg) by mouth on Mondays & take 1 tablet (2.5 mg) by mouth on  Sundays, Tuesdays, Wednesdays, Thursdays, Fridays, & Saturdays at night. Start taking on: May 21, 2020 What changed:  how much to take how to take this when to take this additional instructions These instructions start on May 21, 2020. If you are unsure what to do until then, ask your doctor or other care provider.         Outstanding Labs/Studies   F/u CBC on Thursday 7/8.  Duration of Discharge Encounter   Greater than 30 minutes including physician time.  Signed, Murray Hodgkins, NP  05/16/2020, 11:37 AM  Patient seen and examined.  Plan as discussed in my rounding note for today and outlined above. Jeneen Rinks [facility]  05/16/2020  11:42 AM

## 2020-05-16 NOTE — Discharge Instructions (Signed)
**  PLEASE REMEMBER TO BRING ALL OF YOUR MEDICATIONS TO EACH OF YOUR FOLLOW-UP OFFICE VISITS.  Groin Site Care Refer to this sheet in the next few weeks. These instructions provide you with information on caring for yourself after your procedure. Your caregiver may also give you more specific instructions. Your treatment has been planned according to current medical practices, but problems sometimes occur. Call your caregiver if you have any problems or questions after your procedure. HOME CARE INSTRUCTIONS  You may shower 24 hours after the procedure. Remove the bandage (dressing) and gently wash the site with plain soap and water. Gently pat the site dry.   Do not apply powder or lotion to the site.   Do not sit in a bathtub, swimming pool, or whirlpool for 5 to 7 days.   No bending, squatting, or lifting anything over 10 pounds (4.5 kg) as directed by your caregiver.   Inspect the site at least twice daily.   Do not drive home if you are discharged the same day of the procedure. Have someone else drive you.  What to expect:  Any bruising will usually fade within 1 to 2 weeks.   Blood that collects in the tissue (hematoma) may be painful to the touch. It should usually decrease in size and tenderness within 1 to 2 weeks.  SEEK IMMEDIATE MEDICAL CARE IF:  You have unusual pain at the groin site or down the affected leg.   You have redness, warmth, swelling, or pain at the groin site.   You have drainage (other than a small amount of blood on the dressing).   You have chills.   You have a fever or persistent symptoms for more than 72 hours.   You have a fever and your symptoms suddenly get worse.   Your leg becomes pale, cool, tingly, or numb.  You have heavy bleeding from the site. Hold pressure on the site. [name]

## 2020-05-16 NOTE — Progress Notes (Signed)
D/C instructions given to patient. Medications and wound care reviewed. IV removed, clean and intact. All questions answered. Daughter to escort pt home.  Clyde Canterbury, RN

## 2020-05-16 NOTE — Progress Notes (Signed)
Patient c/o her back hurting just below the medial part of her back wait area. Pt asked for tylenol which is administered, and  Pt also c/o not having a bowel movement since admission. we'll continue to monitor.

## 2020-05-16 NOTE — Progress Notes (Signed)
Pt ambulated in hallway with walker 492ft. Tolerated well. Returned to [employer]. Will continue to monitor.  Clyde Canterbury, RN

## 2020-05-16 NOTE — Progress Notes (Addendum)
Progress Note  Patient Name: [name] Date of Encounter: 05/16/2020  Primary Cardiologist: Shelva Majestic, MD/ PV - J. Gwenlyn Found, MD   Subjective   Still w/ bilat groin tenderness, but overall feeling better.  She had low back pain earlier this AM, which has since resolved.  She ambulated down the hall yesterday w/o difficulty but would like to walk more this AM.  Inpatient Medications    Scheduled Meds: . Chlorhexidine Gluconate Cloth  6 each Topical Daily  . cholecalciferol  1,000 Units Oral Q1200  . clopidogrel  75 mg Oral Daily  . docusate sodium  200 mg Oral QPM  . pyridOXINE  100 mg Oral Q1200  . sodium chloride flush  3 mL Intravenous Q12H  . sodium chloride flush  3 mL Intravenous Q12H  . vitamin B-12  1,000 mcg Oral Q1200   Continuous Infusions: . sodium chloride     PRN Meds: sodium chloride, acetaminophen, albuterol, fluticasone, HYDROmorphone (DILAUDID) injection, nitroGLYCERIN, ondansetron (ZOFRAN) IV, sodium chloride flush, sodium chloride flush, valACYclovir   Vital Signs    Vitals:   05/15/20 2306 05/16/20 0526 05/16/20 0527 05/16/20 0531  BP: (!) 140/53  138/66   Pulse: 71 72 70   Resp: 20 16 18    Temp: 98.1 F (36.7 C) 98.5 F (36.9 C)  98.5 F (36.9 C)  TempSrc: Oral Oral    SpO2: 98% 99% 99%   Weight:      Height:        Intake/Output Summary (Last 24 hours) at 05/16/2020 0834 Last data filed at 05/15/2020 1134 Gross per 24 hour  Intake 203 ml  Output --  Net 203 ml   Filed Weights   05/13/20 1022 05/14/20 0400  Weight: 70.3 kg 73 kg    Physical Exam   GEN: Well nourished, well developed, in no acute distress.  HEENT: Grossly normal.  Neck: Supple, no JVD, carotid bruits, or masses. Cardiac: RRR, no murmurs, rubs, or gallops. No clubbing, cyanosis, edema.  Radials 2+ bilat, DP/PT non-palpable.  Bilat L>R groin ecchymosis extending medially.  L groin ecchymosis extends laterally to L flank as well.  Both groins are tender to light  touch.  No current bleeding, bruit, hematoma. Respiratory:  Respirations regular and unlabored, clear to auscultation bilaterally. GI: Soft, nontender, nondistended, BS + x 4. MS: no deformity or atrophy. Skin: warm and dry, no rash. Neuro:  Strength and sensation are intact. Psych: AAOx3.  Normal affect.  Labs    Chemistry Recent Labs  Lab 05/13/20 2333 05/14/20 0731 05/15/20 0521  NA 133* 136 136  K 4.1 3.6 4.1  CL 105 106 107  CO2 21* 23 25  GLUCOSE 215* 125* 116*  BUN 19 13 11   CREATININE 0.87 0.64 0.53  CALCIUM 7.6* 7.5* 7.9*  GFRNONAA >60 >60 >60  GFRAA >60 >60 >60  ANIONGAP 7 7 4*     Hematology Recent Labs  Lab 05/14/20 1125 05/15/20 0521 05/16/20 0540  WBC 12.2* 9.7 9.7  RBC 2.90* 2.75* 2.98*  HGB 9.2* 8.6* 9.4*  HCT 27.6* 26.5* 27.9*  MCV 95.2 96.4 93.6  MCH 31.7 31.3 31.5  MCHC 33.3 32.5 33.7  RDW 13.4 13.2 13.2  PLT 138* 124* 145*    Radiology    CT ABDOMEN PELVIS WO CONTRAST  Result Date: 05/14/2020 CLINICAL DATA:  Concern for retroperitoneal hematoma with recent bilateral femoral artery access. EXAM: CT ABDOMEN AND PELVIS WITHOUT CONTRAST TECHNIQUE: Multidetector CT imaging of the abdomen and pelvis was  performed following the standard protocol without IV contrast. COMPARISON:  PET-CT 02/02/2020, CT chest 01/07/2020, CT abdomen and pelvis 06/13/2019 FINDINGS: Lower chest: Chronic small left pleural effusion is similar to comparison. Some adjacent passive atelectatic changes. Additional bandlike areas of scarring and/or atelectasis in the lung bases. Normal heart size. No pericardial effusion. Hepatobiliary: Stable subcentimeter hypoattenuating focus in the anterior left lobe liver is unchanged from prior and statistically likely benign. No worrisome focal liver abnormality is seen. Layering hyperdense contrast media within the gallbladder compatible with vicarious extravasation of contrast. Otherwise normal. No visible calcified gallstones. No biliary  ductal dilatation. Pancreas: Unremarkable. No pancreatic ductal dilatation or surrounding inflammatory changes. Spleen: Normal in size without focal abnormality. Adrenals/Urinary Tract: Normal adrenal glands. Excreted contrast material noted with collecting system. Kidneys are normal size. No worrisome renal lesions. No urolithiasis or hydronephrosis. Urinary bladder contains dependently layering excreted contrast media as well without acute bladder abnormality. Small amount of stranding along the anterior bladder is more likely related to the retroperitoneal hemorrhage detailed below. Stomach/Bowel: Distal esophagus, stomach and duodenal sweep are unremarkable. No small bowel wall thickening or dilatation. No evidence of obstruction. The appendix is surgically absent. No colonic dilatation or wall thickening. Vascular/Lymphatic: Extensive atherosclerotic calcification throughout the native aorta and iliac arteries with distal aortobiiliac stenting. Luminal evaluation precluded in the absence of contrast media. Mild stranding is noted adjacent the right femoral artery access site with overlying compression dressing. More extensive stranding and indeed retroperitoneal hemorrhage is seen extending from the left femoral access site into the sub peritoneal space and space of Retzius anteriorly with additional intermediate attenuation fluid in the presacral space posteriorly and tracking along the left pelvic sidewall and iliac more superiorly. No suspicious or enlarged lymph nodes in the included lymphatic chains. Reproductive: Calcified uterine fibroids. No concerning adnexal lesions. Other: Stranding about the access sites as above. Left retroperitoneal hemorrhage, also as above. No bowel containing hernias. Musculoskeletal: Multilevel degenerative changes are present in the imaged portions of the spine. Stable Schmorl's node formations and endplate deformities [id number]. no new acute osseous abnormality. IMPRESSION: 1.  Extensive stranding and retroperitoneal hemorrhage extending from the left femoral access site into the subperitoneal space and Space of Retzius anteriorly, presacral space posteriorly and tracking along the left pelvic sidewall and iliac more superiorly. Overlying compression dressing is in place at this time. 2. Mild stranding adjacent the right femoral artery access site with overlying compression dressing. 3. Distal aortoiliac stenting. Luminal evaluation precluded in the absence of contrast media. Excreted contrast media within the gallbladder lumen and urinary collecting system likely related to recent procedure. 4. Chronic small left pleural effusion. 5. Calcified uterine fibroids. 6. Aortic Atherosclerosis (ICD10-I70.0). Electronically Signed   By: Lovena Le M.D.   On: 05/14/2020 01:51   Telemetry    RSR w/ prolonged run of ventricular bigeminy yesterday afternoon - Personally Reviewed  Cardiac Studies   PV Angio w/ PTA 7.1.2021  Procedures Performed:               1.  Ultrasound-guided right and left common femoral access               2.  Abdominal aortogram/bilateral iliac angiogram               3.  Diamondback orbital rotational atherectomy of the right and left common iliac artery ostia               4.  VBX covered stenting of the right  and left common iliac artery ostia using "kissing stent technique" _____________   Patient Profile     74 y.o. female w/ a h/o CAD s/p CABG x 3 (2008), HTN, HL, PAF/Fl on coumadin,  remote tob abuse, COPD, and PAD, who was admitted 7/1 following bilateral common iliac artery stenting complicated by L retroperitoneal bleed.  Assessment & Plan    1.  PAD:  S/p peripheral angio w/ severe ostial bilateral common iliac dzs now s/p stenting ("kissing stent technique").  Post-procedure course complicated by L RP bleed, though H/H stable this AM.  She ambulated some yesterday w/o recurrent claudication.  Ambulate this AM w/ plan for d/c and f/u  aortoiliac dopplers next week and office f/u in 2 wks.  Cont plavix.  Warfarin currently on hold but will be resumed 7/9.  No ASA in setting of bleed.  2.  L retroperitoneal hemorrhage:  In setting of above.  Groins remain soft but tender.  No bruit/bleeding noted.  H/H stable.  Ambulate.  As previously noted, will cont plavix but hold warfarin for 1 week.  ASA d/c'd.  3.  PAF/Flutter:  Maintaining sinus rhythm.   blocker has been on hold in setting of relative hypotension assoc RP bleed.  Pressures now stable and will resume  blocker.  No warfarin x 1 wk.  Will look to resume 7/9.  4.  CAD:  No chest pain.  Will resume home meds including  blocker, imdur, acei, and statin.  ASA d/c'd in setting of need for plavix and warfarin.  5.  Essential HTN:  bp's stable.  Resume home doses of  blocker and acei.  6.  HL:  LDL 79 last July. Cont statin - will need outpt f/u and potentially a more potent dose (on crestor 10 daily).  7.  Ventricular bigeminy:  Short run of ventricular bigeminy yesterday.  She noted palpitations.   blocker has been on hold - will resume.  8.  COPD:  No active wheezing.  Signed, Murray Hodgkins, NP  05/16/2020, 8:34 AM    For questions or updates, please contact   Please consult [email or web address] for contact info under Cardiology/STEMI.  History and all data above reviewed.  Patient examined.  I agree with the findings as above.   Very long discussion with the patient today.  She ambulated but was disappointed that she did have have complete resolution of her leg discomfor that brought her in.  She does not have any acute swelling.  Legs felt heavy.  Good distal pulses.  Bilateral groin ecchymosis are stable.  Hgb is stable.  Otherwise no acute findings or complaints. The patient exam reveals COR:RRR  ,  Lungs: Clear  ,  Abd: Positive bowel sounds, no rebound no guarding, Ext No edema,  Bilateral ecchymosis without active bleeding  .  All available labs, radiology testing,  previous records reviewed. Agree with documented assessment and plan.   Retroperitoneal hematoma:  Back pain has resolved for the most part.  Hgb is improved.  No acute findings on exam or history.  OK to discharge with close office follow up and labs.   Long discussion with the patient around this.  All of her questions answered.  I went through the details of the angiogram and procedure.    Jeneen Rinks Raylan Hanton  10:49 AM  05/16/2020

## 2020-05-17 ENCOUNTER — Telehealth: Payer: Self-pay | Admitting: Medical

## 2020-05-17 DIAGNOSIS — D62 Acute posthemorrhagic anemia: Secondary | ICD-10-CM

## 2020-05-17 NOTE — Telephone Encounter (Signed)
I received a call from the patient regarding low Bps. She was discharged yesterday from the hospital where she underwent bilateral ostial common iliac artery stenting, admission was complicated by Acute blood loss/RP bleed with Hgb down to 8.6. Hgb on discharge was 9.7 and pressures were still soft. She was discharged with her home BP meds, lisinopril and Metoprolol. She held her Lisinopril last night for low BP, but was unsure the exact number. Today at 2PM BP 92/34 and later 98/52. Most recent was 103/47. Patient reports she is also feeling dizzy and lightheaded and having trouble getting her balance when she stands up. I said it was okay to hold BP meds if [id number] or [id number]<73 or she is symptomatic, and call 911 for syncope. Continue to take Bps and hopefully will continue to slowly improve. Has lab work ordered for this week. She denies CP or worsening SOB. Pt is wondering if she needs to be seen sooner in the office. I informed her I will let Dr. Gwenlyn Found know.   Melynda Krzywicki Kathlen Mody, PA-C

## 2020-05-18 LAB — CBC
Hematocrit: 27.5 % — ABNORMAL LOW (ref 34.0–46.6)
Hemoglobin: 9.3 g/dL — ABNORMAL LOW (ref 11.1–15.9)
MCH: 32.1 pg (ref 26.6–33.0)
MCHC: 33.8 g/dL (ref 31.5–35.7)
MCV: 95 fL (ref 79–97)
Platelets: 217 10*3/uL (ref 150–450)
RBC: 2.9 x10E6/uL — ABNORMAL LOW (ref 3.77–5.28)
RDW: 13.1 % (ref 11.7–15.4)
WBC: 12.1 10*3/uL — ABNORMAL HIGH (ref 3.4–10.8)

## 2020-05-18 NOTE — Telephone Encounter (Signed)
Spoke with patient and she updated me on her recent procedure Has follow-up next month with our office Please contact office for sooner follow up if symptoms do not improve or worsen or seek emergency care

## 2020-05-18 NOTE — Addendum Note (Signed)
Addended by: Cristopher Estimable on: 05/18/2020 02:11 PM   Modules accepted: Orders

## 2020-05-18 NOTE — Telephone Encounter (Signed)
Have her come back to see me in the office this Friday please.  JJB

## 2020-05-18 NOTE — Telephone Encounter (Addendum)
Spoke with pt, she will make sure to bring all her medications with her to her follow up appointment. Order for cbc placed at patients request. Patient reports blood pressure running 94/38 and 96/32. The patient has not taken lisinopril today but has taken the metoprolol. She will continue to monitor her bp.

## 2020-05-18 NOTE — Telephone Encounter (Signed)
[name] is calling requesting a nurse call [name] in regards to her hypotension to discuss medications prior to her appt. [name] also states she is wanting [place]'s hemoglobin checked, but would like to get the labs done in [place]. She states the best number to reach [place] at is [phone]. Please advise.

## 2020-05-19 ENCOUNTER — Telehealth: Payer: Self-pay | Admitting: *Deleted

## 2020-05-19 NOTE — Telephone Encounter (Signed)
Patient called to leave a message for Dr Janese Banks. First she wants to thank Dr Janese Banks for the referral to Dr Gwenlyn Found. 2 An update: she had a 90% blockage of both  The left and right Iliac and they were stinted. She did have some bleeding post procedure and is now a little anemic, but she is home  Now. She states she will see Dr Janese Banks in August as planned
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
## 2020-05-20 ENCOUNTER — Ambulatory Visit (INDEPENDENT_AMBULATORY_CARE_PROVIDER_SITE_OTHER): Payer: Medicare Other | Admitting: General Practice

## 2020-05-20 ENCOUNTER — Encounter: Payer: Self-pay | Admitting: General Practice

## 2020-05-20 ENCOUNTER — Other Ambulatory Visit: Payer: Self-pay

## 2020-05-20 VITALS — BP 134/80 | HR 78 | Ht 63.0 in | Wt 157.0 lb

## 2020-05-20 DIAGNOSIS — R58 Hemorrhage, not elsewhere classified: Secondary | ICD-10-CM

## 2020-05-20 DIAGNOSIS — I10 Essential (primary) hypertension: Secondary | ICD-10-CM | POA: Diagnosis not present

## 2020-05-20 DIAGNOSIS — E785 Hyperlipidemia, unspecified: Secondary | ICD-10-CM

## 2020-05-20 DIAGNOSIS — I739 Peripheral vascular disease, unspecified: Secondary | ICD-10-CM | POA: Diagnosis not present

## 2020-05-20 DIAGNOSIS — Z79899 Other long term (current) drug therapy: Secondary | ICD-10-CM

## 2020-05-20 DIAGNOSIS — K5900 Constipation, unspecified: Secondary | ICD-10-CM

## 2020-05-20 MED ORDER — EZETIMIBE 10 MG PO TABS
10.0000 mg | ORAL_TABLET | Freq: Every day | ORAL | 3 refills | Status: DC
Start: 2020-05-20 — End: 2021-03-14

## 2020-05-20 NOTE — Patient Instructions (Signed)
Medication Instructions:  START ZETIA 10MG  IN THE EVENING  TAKE YOUR METOPROLOL 12.5MG  TWICE DAILY *If you need a refill on your cardiac medications before your next appointment, please call your pharmacy*  Lab Work: CBC TODAY AND IN 8 WEEKS(ABOUT SEPT 2) FASTING CHOLESTEROL AND LFT If you have labs (blood work) drawn today and your tests are completely normal, you will receive your results only by:  [place] (if you have MyChart) OR A paper copy in the mail.  If you have any lab test that is abnormal or we need to change your treatment, we will call you to review the results. You may go to any [facility] that is convenient for you however, we do have a lab in our office that is able to assist you. You DO NOT need an appointment for our lab. The lab is open 8:00am and closes at 4:00pm. Lunch 12:45 - 1:45pm.  Special Instructions  PLEASE READ AND FOLLOW SALTY 6-ATTACHED  Follow-Up: Your next appointment:  KEEP SCHEDULED APPOINTMENTS   In Person with Quay Burow, MD  At [facility], you and your health needs are our priority.  As part of our continuing mission to provide you with exceptional heart care, we have created designated Provider Care Teams.  These Care Teams include your primary Cardiologist (physician) and Advanced Practice Providers (APPs -  Physician Assistants and Nurse Practitioners) who all work together to provide you with the care you need, when you need it.

## 2020-05-21 ENCOUNTER — Ambulatory Visit (HOSPITAL_COMMUNITY)
Admission: RE | Admit: 2020-05-21 | Discharge: 2020-05-21 | Disposition: A | Discharge status: Home / Self Care | Payer: Medicare Other | Source: Ambulatory Visit | Attending: Cardiovascular Disease | Admitting: Cardiovascular Disease

## 2020-05-21 ENCOUNTER — Encounter: Payer: Self-pay | Admitting: Cardiovascular Disease

## 2020-05-21 ENCOUNTER — Ambulatory Visit (INDEPENDENT_AMBULATORY_CARE_PROVIDER_SITE_OTHER): Payer: Medicare Other | Admitting: Cardiovascular Disease

## 2020-05-21 ENCOUNTER — Telehealth: Payer: Self-pay | Admitting: *Deleted

## 2020-05-21 VITALS — BP 126/70 | HR 74 | Ht 63.0 in | Wt 157.0 lb

## 2020-05-21 DIAGNOSIS — R0989 Other specified symptoms and signs involving the circulatory and respiratory systems: Secondary | ICD-10-CM

## 2020-05-21 DIAGNOSIS — I252 Old myocardial infarction: Secondary | ICD-10-CM

## 2020-05-21 DIAGNOSIS — I2510 Atherosclerotic heart disease of native coronary artery without angina pectoris: Secondary | ICD-10-CM

## 2020-05-21 DIAGNOSIS — I739 Peripheral vascular disease, unspecified: Secondary | ICD-10-CM | POA: Diagnosis not present

## 2020-05-21 LAB — CBC
Hematocrit: 33 % — ABNORMAL LOW (ref 34.0–46.6)
Hemoglobin: 10.9 g/dL — ABNORMAL LOW (ref 11.1–15.9)
MCH: 32.2 pg (ref 26.6–33.0)
MCHC: 33 g/dL (ref 31.5–35.7)
MCV: 97 fL (ref 79–97)
Platelets: 290 10*3/uL (ref 150–450)
RBC: 3.39 x10E6/uL — ABNORMAL LOW (ref 3.77–5.28)
RDW: 13 % (ref 11.7–15.4)
WBC: 14.3 10*3/uL — ABNORMAL HIGH (ref 3.4–10.8)

## 2020-05-21 MED ORDER — SLOW FE 142 (45 FE) MG PO TBCR: EXTENDED_RELEASE_TABLET | ORAL | 1 refills | Status: DC

## 2020-05-21 NOTE — Addendum Note (Signed)
Addended by: Alvina Filbert B on: 05/21/2020 10:09 AM   Modules accepted: Orders

## 2020-05-21 NOTE — Progress Notes (Signed)
05/21/2020 [name]   21-Dec-1945  [id number]  Primary Physician [name], MD Primary Cardiologist: Lorretta Harp MD Garret Reddish, [place], [place]  HPI:  [name] is a 74 y.o.   divorced, mother of 2, grandmother to grandchildren referred by Dr. Claiborne Billings for peripheral vascular evaluation because of lifestyle limiting claudication.I last saw her in the office  05/04/2020. She has a history of coronary artery disease status post bypass grafting 3 2008. She has COPD with 40 pack years tobacco abuse having quit 10 years ago as well as history of hypertension and hyperlipidemia. She's also had squamous cell carcinoma surgically removed with a VATS by Dr. Caffie Pinto 07/02/17 with some perioperative A. fib. She denies respiratory shortness of breath. She does complain of somewhat limiting claudication. She did have remote Dopplers in 2011 revealed normal ABIs however most recent Dopplers performed 08/13/17 revealed a right ABI of 0.84 and a leftABI of .72.She did have mild to moderate iliac disease.  She is limited somewhat by shortness of breath from her COPD but she also has progressive lifestyle limiting claudication.  Dopplers performed 04/22/2020 revealed a right ABI of 1.01 a left of 0.83 with high-frequency signals in both iliac arteries.  Based on this, we decided to proceed with angiography potential endovascular therapy for lifestyle limiting claudication.  I performed angiography on 05/13/2020 revealing high-grade calcified ostial bilateral iliac stenoses.  I performed orbital atherectomy, PTA and covered stenting using "kissing stent technique of both iliac arteries with excellent angiographic result.  Unfortunately, that night she developed back pain and hypotension.  CT scan showed a retroperitoneal bleed.  She was transferred to the ICU where she was observed for the next several days.  Hemoglobin stabilized.  She ambulated prior to discharge on the fourth.  She says that her legs  feel "heavy" but there is no edema.  She does have a significant ecchymoses in both groins, suprapubic area and flanks.  Her hemoglobin was 10.9 yesterday.   Current Meds  Medication Sig  . acetaminophen (TYLENOL) 500 MG tablet Take 1,000 mg by mouth every 8 (eight) hours as needed (pain.).   [name] albuterol (VENTOLIN HFA) 108 (90 Base) MCG/ACT inhaler Inhale 2 puffs into the lungs every 4 (four) hours as needed for wheezing or shortness of breath. Inhale 1 to 2 puffs as needed before exercise.  . Calcium Carb-Cholecalciferol (CALTRATE 600+D3 PO) Take 1 tablet by mouth daily at 12 noon.  . cholecalciferol (VITAMIN D) 25 MCG (1000 UNIT) tablet Take 1,000 Units by mouth daily at 12 noon.   . clopidogrel (PLAVIX) 75 MG tablet Take 1 tablet (75 mg total) by mouth daily.  . Coenzyme Q10 (COQ10) 100 MG CAPS Take 100 mg by mouth daily at 12 noon.  . docusate sodium (COLACE) 100 MG capsule Take 200 mg by mouth every evening.   . ezetimibe (ZETIA) 10 MG tablet Take 1 tablet (10 mg total) by mouth daily.  . fluticasone (FLONASE) 50 MCG/ACT nasal spray Place 1 spray into both nostrils at bedtime as needed for allergies or rhinitis.  . hydrocortisone valerate cream (WESTCORT) 0.2 % Apply 1 application topically 3 (three) times a week. On random days - for eczema in ear  . isosorbide mononitrate (IMDUR) 30 MG 24 hr tablet Take 1 tablet (30 mg total) by mouth 2 (two) times daily.  [name] lisinopril (ZESTRIL) 20 MG tablet TAKE 1 TABLET(20 MG) BY MOUTH TWICE DAILY (Patient taking differently: Take 20 mg by mouth in  the morning and at bedtime. )  . metoprolol succinate (TOPROL-XL) 25 MG 24 hr tablet Take 12.5 mg by mouth 2 (two) times daily.  . metoprolol tartrate (LOPRESSOR) 25 MG tablet Take 25 mg by mouth daily as needed (afib).  . nitroGLYCERIN (NITROSTAT) 0.4 MG SL tablet Place 1 tablet (0.4 mg total) under the tongue every 5 (five) minutes x 3 doses as needed for chest pain.  . NON FORMULARY Place 1 drop into both  eyes in the morning and at bedtime.   . pyridOXINE (VITAMIN B-6) 100 MG tablet Take 100 mg by mouth daily at 12 noon.  . rosuvastatin (CRESTOR) 10 MG tablet TAKE 1 TABLET(10 MG) BY MOUTH DAILY (Patient taking differently: Take 10 mg by mouth every evening. )  . umeclidinium bromide (INCRUSE ELLIPTA) 62.5 MCG/INH AEPB Inhale 1 puff into the lungs daily. (Patient taking differently: Inhale 1 puff into the lungs at bedtime. )  . valACYclovir (VALTREX) 1000 MG tablet Take 2,000 mg by mouth daily as needed (fever blisters/cold sores.).   [name] vitamin B-12 (CYANOCOBALAMIN) 500 MCG tablet Take 1,000 mcg by mouth daily at 12 noon.  . warfarin (COUMADIN) 2.5 MG tablet Take 0.5-1 tablets (1.25-2.5 mg total) by mouth See admin instructions. **HOLD UNTIL 7/9, THEN RESUME AT PRIOR DOSE** Take 0.5 tablet (1.25 mg) by mouth on Mondays & take 1 tablet (2.5 mg) by mouth on Sundays, Tuesdays, Wednesdays, Thursdays, Fridays, & Saturdays at night.     Allergies  Allergen Reactions  . Amiodarone Other (See Comments)    angioedema  . Clindamycin/Lincomycin Swelling    TROUBLE SWALLOWING......SEVERE CHEST PAIN  . Doxycycline Other (See Comments)    blistering  . Sulfa Antibiotics Photosensitivity, Rash and Other (See Comments)    Red, burning rash & paralysis Burning Rash  . Lipitor [Atorvastatin] Other (See Comments)    MYALGIAS LEG PAIN  . Phenergan [Promethazine Hcl] Other (See Comments)    Nervous Leg / Restless Leg Syndrome  . Reclast [Zoledronic Acid] Other (See Comments)    Flu symptoms - made pt very sick, and had inflammation  In her eye  . Ketorolac Nausea Only    headache  . Diltiazem Other (See Comments)    Weakness on oral Dilt  . Latex Rash    Social History   Socioeconomic History  . Marital status: Divorced    Spouse name: Not on file  . Number of children: 2  . Years of education: Not on file  . Highest education level: Not on file  Occupational History  . Occupation: Retired    Tobacco Use  . Smoking status: Former Smoker    Packs/day: 1.00    Years: 40.00    Pack years: 40.00    Types: Cigarettes    Quit date: 09/15/2007    Years since quitting: 12.6  . Smokeless tobacco: Never Used  Vaping Use  . Vaping Use: Never used  Substance and Sexual Activity  . Alcohol use: Yes    Alcohol/week: 6.0 standard drinks    Types: 6 Glasses of wine per week  . Drug use: No  . Sexual activity: Never  Other Topics Concern  . Not on file  Social History Narrative   She lives in [place], [place]. Her daughter helps with her care.    Social Determinants of Health   Financial Resource Strain:   . Difficulty of Paying Living Expenses:   Food Insecurity:   . Worried About Charity fundraiser in the Last Year:   .  Ran Out of Food in the Last Year:   Transportation Needs:   . Film/video editor (Medical):   [name] Lack of Transportation (Non-Medical):   Physical Activity:   . Days of Exercise per Week:   . Minutes of Exercise per Session:   Stress:   . Feeling of Stress :   Social Connections:   . Frequency of Communication with Friends and Family:   . Frequency of Social Gatherings with Friends and Family:   . Attends Religious Services:   . Active Member of Clubs or Organizations:   . Attends Archivist Meetings:   [name] Marital Status:   Intimate Partner Violence:   . Fear of Current or Ex-Partner:   . Emotionally Abused:   [name] Physically Abused:   . Sexually Abused:      Review of Systems: General: negative for chills, fever, night sweats or weight changes.  Cardiovascular: negative for chest pain, dyspnea on exertion, edema, orthopnea, palpitations, paroxysmal nocturnal dyspnea or shortness of breath Dermatological: negative for rash Respiratory: negative for cough or wheezing Urologic: negative for hematuria Abdominal: negative for nausea, vomiting, diarrhea, bright red blood per rectum, melena, or hematemesis Neurologic: negative for visual  changes, syncope, or dizziness All other systems reviewed and are otherwise negative except as noted above.    Blood pressure 126/70, pulse 74, height 5\' 3"  (1.6 m), weight 157 lb (71.2 kg).  General appearance: alert and no distress Neck: no adenopathy, no carotid bruit, no JVD, supple, symmetrical, trachea midline and thyroid not enlarged, symmetric, no tenderness/mass/nodules Lungs: clear to auscultation bilaterally Heart: regular rate and rhythm, S1, S2 normal, no murmur, click, rub or gallop Extremities: No edema but extensive ecchymosis. Pulses: 2+ and symmetric Skin: Extensive ecchymosis in her groin, suprapubic area and flank Neurologic: Alert and oriented X 3, normal strength and tone. Normal symmetric reflexes. Normal coordination and gait  EKG not performed today  ASSESSMENT AND PLAN:   PAD (peripheral artery disease) (HCC) History of PAD status post bilateral calcified ostial iliac artery orbital atherectomy followed by VBX covered stenting using "kissing stent technique on 05/13/2020.  Femoral access was ultrasound-guided on both sides.  She had anterior wall punctures.  She had excellent angiographic result.  Her groins were stable at the end of the procedure.  Unfortunately, that night she developed back pain and hypotension.  CT scan showed retroperitoneal bleed.  She was transferred to the ICU for observation.  Hemoglobin remained stable.  She was discharged home on 05/16/2020.  She does have diffuse ecchymosis in her groin area, suprapubic area and flanks.  She says her legs feel "heavy".  She has 2+ pedal pulses bilaterally.  She does have a left femoral bruit.  We will check duplex today to make sure she does not have a pseudoaneurysm.  She is scheduled for aortoiliac and lower extremity arterial Dopplers next week.  We will put her on iron replacement therapy for her anemia.  Given the extreme extent of her ecchymosis I have decided to withhold her Coumadin restart until  05/31/2020.  She remains on Plavix for her recent stent implants.  I will see her back in 4 to 6 weeks for follow-up.      Lorretta Harp MD FACP,FACC,FAHA, [facility] 05/21/2020 9:23 AM

## 2020-05-21 NOTE — Assessment & Plan Note (Signed)
History of PAD status post bilateral calcified ostial iliac artery orbital atherectomy followed by VBX covered stenting using "kissing stent technique on 05/13/2020.  Femoral access was ultrasound-guided on both sides.  She had anterior wall punctures.  She had excellent angiographic result.  Her groins were stable at the end of the procedure.  Unfortunately, that night she developed back pain and hypotension.  CT scan showed retroperitoneal bleed.  She was transferred to the ICU for observation.  Hemoglobin remained stable.  She was discharged home on 05/16/2020.  She does have diffuse ecchymosis in her groin area, suprapubic area and flanks.  She says her legs feel "heavy".  She has 2+ pedal pulses bilaterally.  She does have a left femoral bruit.  We will check duplex today to make sure she does not have a pseudoaneurysm.  She is scheduled for aortoiliac and lower extremity arterial Dopplers next week.  We will put her on iron replacement therapy for her anemia.  Given the extreme extent of her ecchymosis I have decided to withhold her Coumadin restart until 05/31/2020.  She remains on Plavix for her recent stent implants.  I will see her back in 4 to 6 weeks for follow-up.

## 2020-05-21 NOTE — Telephone Encounter (Signed)
Patient in the office today to see Dr Gwenlyn Found who wanted to start iron daily but asked that I check with Pharm D. Discussed with Claiborne Billings D and will have patient start Slow Fe 45 twice daily with food.  Advised patient, verbalized understanding

## 2020-05-21 NOTE — Patient Instructions (Signed)
Medication Instructions:  RESUME COUMADIN 05/31/2020  WILL CALL YOU WITH THE IRON DOSE   *If you need a refill on your cardiac medications before your next appointment, please call your pharmacy*  Lab Work: NONE   Testing/Procedures: RETURN AT 12:30 TODAY FOR SCAN   Follow-Up: At [facility], you and your health needs are our priority.  As part of our continuing mission to provide you with exceptional heart care, we have created designated Provider Care Teams.  These Care Teams include your primary Cardiologist (physician) and Advanced Practice Providers (APPs -  Physician Assistants and Nurse Practitioners) who all work together to provide you with the care you need, when you need it.  We recommend signing up for the patient portal called "MyChart".  Sign up information is provided on this After Visit Summary.  MyChart is used to connect with patients for Virtual Visits (Telemedicine).  Patients are able to view lab/test results, encounter notes, upcoming appointments, etc.  Non-urgent messages can be sent to your provider as well.   To learn more about what you can do with MyChart, go to [email or web address].    Your next appointment:   4 TO 6  week(s)  The format for your next appointment:   In Person  Provider:   You may see DR Gwenlyn Found or one of the following Advanced Practice Providers on your designated Care Team:    Kerin Ransom, PA-C  [place], [place]  Coletta Memos, [place]

## 2020-05-27 ENCOUNTER — Ambulatory Visit (HOSPITAL_COMMUNITY)
Admission: RE | Admit: 2020-05-27 | Discharge: 2020-05-27 | Disposition: A | Discharge status: Home / Self Care | Payer: Medicare Other | Source: Ambulatory Visit | Attending: Cardiovascular Disease | Admitting: Cardiovascular Disease

## 2020-05-27 ENCOUNTER — Other Ambulatory Visit: Payer: Self-pay

## 2020-05-27 ENCOUNTER — Ambulatory Visit (HOSPITAL_BASED_OUTPATIENT_CLINIC_OR_DEPARTMENT_OTHER)
Admit: 2020-05-27 | Discharge: 2020-05-27 | Disposition: A | Discharge status: Home / Self Care | Payer: Medicare Other | Attending: Cardiovascular Disease | Admitting: Cardiovascular Disease

## 2020-05-27 ENCOUNTER — Other Ambulatory Visit: Payer: Self-pay | Admitting: Cardiovascular Disease

## 2020-05-27 DIAGNOSIS — I739 Peripheral vascular disease, unspecified: Secondary | ICD-10-CM

## 2020-05-27 DIAGNOSIS — Z95828 Presence of other vascular implants and grafts: Secondary | ICD-10-CM

## 2020-06-01 ENCOUNTER — Encounter: Payer: Self-pay | Admitting: Cardiovascular Disease

## 2020-06-01 ENCOUNTER — Other Ambulatory Visit: Payer: Self-pay

## 2020-06-01 ENCOUNTER — Ambulatory Visit (INDEPENDENT_AMBULATORY_CARE_PROVIDER_SITE_OTHER): Payer: Medicare Other | Admitting: Cardiovascular Disease

## 2020-06-01 ENCOUNTER — Ambulatory Visit: Payer: Medicare Other | Admitting: Cardiovascular Disease

## 2020-06-01 VITALS — BP 148/78 | HR 52 | Ht 63.0 in | Wt 155.2 lb

## 2020-06-01 DIAGNOSIS — I739 Peripheral vascular disease, unspecified: Secondary | ICD-10-CM | POA: Diagnosis not present

## 2020-06-01 DIAGNOSIS — I252 Old myocardial infarction: Secondary | ICD-10-CM

## 2020-06-01 DIAGNOSIS — I2510 Atherosclerotic heart disease of native coronary artery without angina pectoris: Secondary | ICD-10-CM | POA: Diagnosis not present

## 2020-06-01 DIAGNOSIS — D62 Acute posthemorrhagic anemia: Secondary | ICD-10-CM | POA: Diagnosis not present

## 2020-06-01 LAB — CBC
Hematocrit: 36.8 % (ref 34.0–46.6)
Hemoglobin: 12.6 g/dL (ref 11.1–15.9)
MCH: 32.7 pg (ref 26.6–33.0)
MCHC: 34.2 g/dL (ref 31.5–35.7)
MCV: 96 fL (ref 79–97)
Platelets: 265 10*3/uL (ref 150–450)
RBC: 3.85 x10E6/uL (ref 3.77–5.28)
RDW: 13.2 % (ref 11.7–15.4)
WBC: 10.3 10*3/uL (ref 3.4–10.8)

## 2020-06-01 NOTE — Progress Notes (Signed)
06/01/2020 [name]   [birth year]  [id number]  Primary Physician [name], MD Primary Cardiologist: Lorretta Harp MD Garret Reddish, [place], [place]  HPI:  [name] is a 74 y.o.  divorced, mother of 2, grandmother to grandchildren referred by Dr. Claiborne Billings for peripheral vascular evaluation because of lifestyle limiting claudication.I last saw her in the office  05/21/2020. She has a history of coronary artery disease status post bypass grafting 3 2008. She has COPD with 40 pack years tobacco abuse having quit 10 years ago as well as history of hypertension and hyperlipidemia. She's also had squamous cell carcinoma surgically removed with a VATS by Dr. Caffie Pinto 07/02/17 with some perioperative A. fib. She denies respiratory shortness of breath. She does complain of somewhat limiting claudication. She did have remote Dopplers in 2011 revealed normal ABIs however most recent Dopplers performed 08/13/17 revealed a right ABI of 0.84 and a leftABI of .72.She did have mild to moderate iliac disease.  She is limited somewhat by shortness of breath from her COPD but she also has progressive lifestyle limiting claudication.Dopplers performed 04/22/2020 revealed a right ABI of 1.01 a left of 0.83 with high-frequency signals in both iliac arteries.  Based on this, we decided to proceed with angiography potential endovascular therapy for lifestyle limiting claudication.  I performed angiography on 05/13/2020 revealing high-grade calcified ostial bilateral iliac stenoses.  I performed orbital atherectomy, PTA and covered stenting using "kissing stent technique of both iliac arteries with excellent angiographic result.  Unfortunately, that night she developed back pain and hypotension.  CT scan showed a retroperitoneal bleed.  She was transferred to the ICU where she was observed for the next several days.  Hemoglobin stabilized.  She ambulated prior to discharge on the fourth.  She says that her  legs feel "heavy" but there is no edema.  She does have a significant ecchymoses in both groins, suprapubic area and flanks.  Her hemoglobin was 10.9 yesterday.  Since I saw her 11 days ago her claudication has gotten somewhat better.  Her ecchymosis has resolved.  Her Dopplers performed on 05/29/2020 have normalized with normal ABIs and velocities.  She is scheduled to restart her Coumadin today.  Her major complaints are of dyspnea on exertion.    No outpatient medications have been marked as taking for the 06/01/20 encounter (Office Visit) with Lorretta Harp, MD.     Allergies  Allergen Reactions  . Amiodarone Other (See Comments)    angioedema  . Clindamycin/Lincomycin Swelling    TROUBLE SWALLOWING......SEVERE CHEST PAIN  . Doxycycline Other (See Comments)    blistering  . Sulfa Antibiotics Photosensitivity, Rash and Other (See Comments)    Red, burning rash & paralysis Burning Rash  . Lipitor [Atorvastatin] Other (See Comments)    MYALGIAS LEG PAIN  . Phenergan [Promethazine Hcl] Other (See Comments)    Nervous Leg / Restless Leg Syndrome  . Reclast [Zoledronic Acid] Other (See Comments)    Flu symptoms - made pt very sick, and had inflammation  In her eye  . Ketorolac Nausea Only    headache  . Diltiazem Other (See Comments)    Weakness on oral Dilt  . Latex Rash    Social History   Socioeconomic History  . Marital status: Divorced    Spouse name: Not on file  . Number of children: 2  . Years of education: Not on file  . Highest education level: Not on file  Occupational History  .  Occupation: Retired  Tobacco Use  . Smoking status: Former Smoker    Packs/day: 1.00    Years: 40.00    Pack years: 40.00    Types: Cigarettes    Quit date: 09/15/2007    Years since quitting: 12.7  . Smokeless tobacco: Never Used  Vaping Use  . Vaping Use: Never used  Substance and Sexual Activity  . Alcohol use: Yes    Alcohol/week: 6.0 standard drinks    Types: 6  Glasses of wine per week  . Drug use: No  . Sexual activity: Never  Other Topics Concern  . Not on file  Social History Narrative   She lives in [place], [place]. Her daughter helps with her care.    Social Determinants of Health   Financial Resource Strain:   . Difficulty of Paying Living Expenses:   Food Insecurity:   . Worried About Charity fundraiser in the Last Year:   . Arboriculturist in the Last Year:   Transportation Needs:   . Film/video editor (Medical):   [name] Lack of Transportation (Non-Medical):   Physical Activity:   . Days of Exercise per Week:   . Minutes of Exercise per Session:   Stress:   . Feeling of Stress :   Social Connections:   . Frequency of Communication with Friends and Family:   . Frequency of Social Gatherings with Friends and Family:   . Attends Religious Services:   . Active Member of Clubs or Organizations:   . Attends Archivist Meetings:   [name] Marital Status:   Intimate Partner Violence:   . Fear of Current or Ex-Partner:   . Emotionally Abused:   [name] Physically Abused:   . Sexually Abused:      Review of Systems: General: negative for chills, fever, night sweats or weight changes.  Cardiovascular: negative for chest pain, dyspnea on exertion, edema, orthopnea, palpitations, paroxysmal nocturnal dyspnea or shortness of breath Dermatological: negative for rash Respiratory: negative for cough or wheezing Urologic: negative for hematuria Abdominal: negative for nausea, vomiting, diarrhea, bright red blood per rectum, melena, or hematemesis Neurologic: negative for visual changes, syncope, or dizziness All other systems reviewed and are otherwise negative except as noted above.    Blood pressure (!) 148/78, pulse (!) 52, height 5\' 3"  (1.6 m), weight 155 lb 3.2 oz (70.4 kg), SpO2 99 %.  General appearance: alert and no distress Neck: no adenopathy, no carotid bruit, no JVD, supple, symmetrical, trachea midline and thyroid not  enlarged, symmetric, no tenderness/mass/nodules Lungs: clear to auscultation bilaterally Heart: regular rate and rhythm, S1, S2 normal, no murmur, click, rub or gallop Extremities: extremities normal, atraumatic, no cyanosis or edema Pulses: 2+ and symmetric Skin: Skin color, texture, turgor normal. No rashes or lesions Neurologic: Alert and oriented X 3, normal strength and tone. Normal symmetric reflexes. Normal coordination and gait  EKG not performed today  ASSESSMENT AND PLAN:   PAD (peripheral artery disease) (HCC) Status post bilateral orbital atherectomy, PTA and covered stenting of high-grade calcified ostial iliac stenosis using "kissing stent technique on 05/13/2020.  This is complicated by a left retroperitoneal hematoma.  I saw her on 7/9 and at that point her hemoglobin was 10.9.  She is scheduled to restart her Coumadin because of PAF.  She is on Plavix.  Her follow-up Doppler studies performed 05/29/2020 revealed normal ABIs and normal velocities in her iliac arteries.  She thinks that her legs are feeling somewhat better.  The ecchymoses in her suprapubic area groin and thighs have almost completely resolved.  Her major complaints now are of dyspnea.  I am going to recheck a CBC today.      Lorretta Harp MD FACP,FACC,FAHA, [facility] 06/01/2020 2:48 PM

## 2020-06-01 NOTE — Assessment & Plan Note (Signed)
Status post bilateral orbital atherectomy, PTA and covered stenting of high-grade calcified ostial iliac stenosis using "kissing stent technique on 05/13/2020.  This is complicated by a left retroperitoneal hematoma.  I saw her on 7/9 and at that point her hemoglobin was 10.9.  She is scheduled to restart her Coumadin because of PAF.  She is on Plavix.  Her follow-up Doppler studies performed 05/29/2020 revealed normal ABIs and normal velocities in her iliac arteries.  She thinks that her legs are feeling somewhat better.  The ecchymoses in her suprapubic area groin and thighs have almost completely resolved.  Her major complaints now are of dyspnea.  I am going to recheck a CBC today.

## 2020-06-01 NOTE — Patient Instructions (Addendum)
Medication Instructions:  STOP FERROUS SULFATE *If you need a refill on your cardiac medications before your next appointment, please call your pharmacy*   Lab Work: Your physician recommends that you HAVE LAB WORK TODAY If you have labs (blood work) drawn today and your tests are completely normal, you will receive your results only by: [name] MyChart Message (if you have MyChart) OR . A paper copy in the mail If you have any lab test that is abnormal or we need to change your treatment, we will call you to review the results   Follow-Up: At [facility], you and your health needs are our priority.  As part of our continuing mission to provide you with exceptional heart care, we have created designated Provider Care Teams.  These Care Teams include your primary Cardiologist (physician) and Advanced Practice Providers (APPs -  Physician Assistants and Nurse Practitioners) who all work together to provide you with the care you need, when you need it.  We recommend signing up for the patient portal called "MyChart".  Sign up information is provided on this After Visit Summary.  MyChart is used to connect with patients for Virtual Visits (Telemedicine).  Patients are able to view lab/test results, encounter notes, upcoming appointments, etc.  Non-urgent messages can be sent to your provider as well.   To learn more about what you can do with MyChart, go to [email or web address].    Your next appointment:   3 month(s)  The format for your next appointment:   In Person  Provider:   Quay Burow, MD

## 2020-06-16 ENCOUNTER — Ambulatory Visit (INDEPENDENT_AMBULATORY_CARE_PROVIDER_SITE_OTHER): Payer: Medicare Other

## 2020-06-16 ENCOUNTER — Encounter: Payer: Self-pay | Admitting: Pulmonary Disease

## 2020-06-16 ENCOUNTER — Ambulatory Visit (INDEPENDENT_AMBULATORY_CARE_PROVIDER_SITE_OTHER): Payer: Medicare Other | Admitting: Pulmonary Disease

## 2020-06-16 ENCOUNTER — Other Ambulatory Visit: Payer: Self-pay

## 2020-06-16 VITALS — BP 128/70 | HR 66 | Temp 98.2°F | Ht 63.0 in | Wt 154.4 lb

## 2020-06-16 DIAGNOSIS — J432 Centrilobular emphysema: Secondary | ICD-10-CM | POA: Diagnosis not present

## 2020-06-16 DIAGNOSIS — Z5181 Encounter for therapeutic drug level monitoring: Secondary | ICD-10-CM | POA: Diagnosis not present

## 2020-06-16 DIAGNOSIS — Z9889 Other specified postprocedural states: Secondary | ICD-10-CM

## 2020-06-16 DIAGNOSIS — I5022 Chronic systolic (congestive) heart failure: Secondary | ICD-10-CM

## 2020-06-16 DIAGNOSIS — Z85118 Personal history of other malignant neoplasm of bronchus and lung: Secondary | ICD-10-CM

## 2020-06-16 DIAGNOSIS — I480 Paroxysmal atrial fibrillation: Secondary | ICD-10-CM

## 2020-06-16 LAB — POCT INR: INR: 1.8 — AB (ref 2.0–3.0)

## 2020-06-16 MED ORDER — ANORO ELLIPTA 62.5-25 MCG/INH IN AEPB
1.0000 | INHALATION_SPRAY | Freq: Every day | RESPIRATORY_TRACT | 0 refills | Status: DC
Start: 2020-06-16 — End: 2020-07-12

## 2020-06-16 MED ORDER — UMECLIDINIUM-VILANTEROL 62.5-25 MCG/INH IN AEPB: 1.0000 | INHALATION_SPRAY | Freq: Every day | RESPIRATORY_TRACT | 6 refills | Status: DC

## 2020-06-16 NOTE — Patient Instructions (Signed)
Take 1.5 tablets today and then continue with 1 tablet daily except 1/2 tablet on Mondays. Repeat INR in 2 weeks.

## 2020-06-16 NOTE — Patient Instructions (Addendum)
Thank you for visiting Dr. Valeta Harms at [facility]. Today we recommend the following:  Meds ordered this encounter  Medications  . umeclidinium-vilanterol (ANORO ELLIPTA) 62.5-25 MCG/INH AEPB    Sig: Inhale 1 puff into the lungs daily.    Dispense:  1 each    Refill:  6   Trial of Anoro Ellipta, samples today if we have them.  Let us know how you are doing with this.   Return in about 6 weeks (around 07/28/2020) for with Rexene Edison, NP .  Review inhaler regimen.     Please do your part to reduce the spread of COVID-19.
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
## 2020-06-22 ENCOUNTER — Ambulatory Visit: Payer: Medicare Other | Admitting: Cardiovascular Disease

## 2020-06-30 ENCOUNTER — Other Ambulatory Visit: Payer: Self-pay

## 2020-06-30 ENCOUNTER — Ambulatory Visit (INDEPENDENT_AMBULATORY_CARE_PROVIDER_SITE_OTHER): Payer: Medicare Other

## 2020-06-30 DIAGNOSIS — I480 Paroxysmal atrial fibrillation: Secondary | ICD-10-CM | POA: Diagnosis not present

## 2020-06-30 DIAGNOSIS — Z5181 Encounter for therapeutic drug level monitoring: Secondary | ICD-10-CM | POA: Diagnosis not present

## 2020-06-30 LAB — POCT INR: INR: 1.8 — AB (ref 2.0–3.0)

## 2020-06-30 NOTE — Patient Instructions (Signed)
Take 1.5 tablets today and then increase to 1 tablet daily.  Repeat INR in 2 weeks.

## 2020-07-05 ENCOUNTER — Other Ambulatory Visit: Payer: Self-pay | Admitting: *Deleted

## 2020-07-05 DIAGNOSIS — Z85118 Personal history of other malignant neoplasm of bronchus and lung: Secondary | ICD-10-CM

## 2020-07-12 ENCOUNTER — Other Ambulatory Visit: Payer: Self-pay

## 2020-07-12 ENCOUNTER — Ambulatory Visit (INDEPENDENT_AMBULATORY_CARE_PROVIDER_SITE_OTHER): Payer: Medicare Other | Admitting: Cardiovascular Disease

## 2020-07-12 ENCOUNTER — Ambulatory Visit (INDEPENDENT_AMBULATORY_CARE_PROVIDER_SITE_OTHER): Payer: Medicare Other

## 2020-07-12 ENCOUNTER — Encounter: Payer: Self-pay | Admitting: Cardiovascular Disease

## 2020-07-12 DIAGNOSIS — I2510 Atherosclerotic heart disease of native coronary artery without angina pectoris: Secondary | ICD-10-CM | POA: Diagnosis not present

## 2020-07-12 DIAGNOSIS — I480 Paroxysmal atrial fibrillation: Secondary | ICD-10-CM | POA: Diagnosis not present

## 2020-07-12 DIAGNOSIS — Z5181 Encounter for therapeutic drug level monitoring: Secondary | ICD-10-CM | POA: Diagnosis not present

## 2020-07-12 DIAGNOSIS — I739 Peripheral vascular disease, unspecified: Secondary | ICD-10-CM | POA: Diagnosis not present

## 2020-07-12 DIAGNOSIS — I252 Old myocardial infarction: Secondary | ICD-10-CM

## 2020-07-12 DIAGNOSIS — E785 Hyperlipidemia, unspecified: Secondary | ICD-10-CM

## 2020-07-12 DIAGNOSIS — Z7901 Long term (current) use of anticoagulants: Secondary | ICD-10-CM

## 2020-07-12 DIAGNOSIS — C9110 Chronic lymphocytic leukemia of B-cell type not having achieved remission: Secondary | ICD-10-CM

## 2020-07-12 LAB — POCT INR: INR: 2.5 (ref 2.0–3.0)

## 2020-07-12 MED ORDER — AMLODIPINE BESYLATE 2.5 MG PO TABS
2.5000 mg | ORAL_TABLET | Freq: Every day | ORAL | 3 refills | Status: DC
Start: 2020-07-12 — End: 2020-07-13

## 2020-07-12 NOTE — Patient Instructions (Signed)
Continue taking 1 tablet daily.  Repeat INR in 4 weeks.

## 2020-07-12 NOTE — Progress Notes (Signed)
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 test in 2015 which was normal.  A follow-up nuclear perfusion study in April 2018 was unchanged and continued to show normal perfusion and LV function.  She has a history of paroxysmal atrial fibrillation and had previously undergone cardioversion.   She is on warfarin for anticoagulation due to the cost of DOACs. She was  found to have a spiculated hypermetabolic lung nodule on CT which had progressed in size and underwent a VATS procedure for resection.  This was complicated by postoperative bleeding 2 days later, requiring surgical exploration.  She again developed postoperative atrial fibrillation and had been maintained on amiodarone , but this ultimately was discontinued due to bradycardia.  Since I last saw her she developed progressive claudication symptoms and was found to have high-grade calcified bilateral iliac stenoses and underwent orbital atherectomy, PTA, and covered stenting using kissing technique both iliac arteries.  She subsequently developed a retroperitoneal a later that night but ultimately hemoglobin stabilized.  She has been seen in follow-up by Dr. Alvester Chou.  Her claudication is much better following her procedure.  Time she does experience some mild tightness in her calves.  Her blood pressure today is elevated despite taking isosorbide 30 mg twice a day, metoprolol succinate 25 mg daily (but she has been  taking 12.5 twice daily) in addition to lisinopril 20 mg.  I have suggested the addition of amlodipine and since her blood pressure is elevated in the morning we will initiate this at 2.5 mg at bedtime.  She is on Plavix and is now back on warfarin 2.5 mg.  She does have easy bruisability she is not having anginal symptoms.  She was wondering ultimately about the possibility of changing to Eliquis in the future.  However since she is still on Plavix with her recent procedure.  IfPlavix is to be ultimately discontinued  warfarin can be changed to Eliquis.  She is maintaining sinus rhythm.  She is now back on Zetia 10 mg and low-dose rosuvastatin for hyperlipidemia with an LDL cholesterol at 94 2 months ago.  She will be undergoing a follow-up CT of her chest on September 8 with office visit with Dr. [name].  She is followed by Dr.Rao for her CLL. I will see her in 6 months for follow-up evaluation    Troy Sine, MD, [facility]  07/14/2020 7:26 PM

## 2020-07-12 NOTE — Patient Instructions (Signed)
Medication Instructions:  BEGIN TAKING 2.5MG  OF AMLODIPINE AT BEDTIME *If you need a refill on your cardiac medications before your next appointment, please call your pharmacy*   Follow-Up: At [facility], you and your health needs are our priority.  As part of our continuing mission to provide you with exceptional heart care, we have created designated Provider Care Teams.  These Care Teams include your primary Cardiologist (physician) and Advanced Practice Providers (APPs -  Physician Assistants and Nurse Practitioners) who all work together to provide you with the care you need, when you need it.  We recommend signing up for the patient portal called "MyChart".  Sign up information is provided on this After Visit Summary.  MyChart is used to connect with patients for Virtual Visits (Telemedicine).  Patients are able to view lab/test results, encounter notes, upcoming appointments, etc.  Non-urgent messages can be sent to your provider as well.   To learn more about what you can do with MyChart, go to [email or web address].    Your next appointment:   6 month(s)  The format for your next appointment:   In Person  Provider:   Shelva Majestic, MD

## 2020-07-13 ENCOUNTER — Other Ambulatory Visit: Payer: Self-pay

## 2020-07-13 ENCOUNTER — Telehealth: Payer: Self-pay | Admitting: Cardiovascular Disease

## 2020-07-13 MED ORDER — AMLODIPINE BESYLATE 2.5 MG PO TABS: 2.5000 mg | ORAL_TABLET | Freq: Every day | ORAL | 0 refills | Status: DC

## 2020-07-13 NOTE — Telephone Encounter (Signed)
Called and spoke with pt she states that some of her main medications had not been added to her list. Pt states she is taking lisinopril 20mg  twice a day, coumadin 2.5mg  daily, and metoprolol succinate 25mg  (1/2 tab in the morning and 1/2 tab in the evening). meds updated on pt's list.  Pt also would like to know if she needs to continue taking the isosorbide 30mg  twice a day? Notified I would send this message to Dr.Kelly to review. Pt verbalized understanding with no other questions at this time.

## 2020-07-13 NOTE — Telephone Encounter (Signed)
Pt c/o medication issue:  1. Name of Medication:  Metoprolol Succinate Coumadin Lisinopril   2. How are you currently taking this medication (dosage and times per day)? N/A  3. Are you having a reaction (difficulty breathing--STAT)? No   4. What is your medication issue? Patient states the pharmacy is unable to fill medication because prescription is missing information. She states she is requesting to discuss this with Dr. Evette Georges nurse specifically.   Patient states she would also prefer to have a 30 day supply of amLODipine (NORVASC) 2.5 MG tablet medication sent to [place], [place]. She states she will start out with 30 days because this is her first time trying the medication.

## 2020-07-14 ENCOUNTER — Encounter: Payer: Self-pay | Admitting: Cardiovascular Disease

## 2020-07-14 NOTE — Telephone Encounter (Signed)
Okay to continue isosorbide 30 mg twice daily.  Amlodipine 2.5 mg at bedtime was added at her office visit

## 2020-07-15 NOTE — Telephone Encounter (Signed)
Called and spoke with pt, advised of Dr.Kelly's recommendations. Pt states she is to continue her lisinopril twice a day, her isosorbide twice a day, and her amlodipine 2.5mg  at night. Advised that yes she is to continue her medications as prescribed. Pt verbalized understanding with no other questions at this time.

## 2020-07-21 ENCOUNTER — Other Ambulatory Visit: Payer: Self-pay

## 2020-07-21 ENCOUNTER — Ambulatory Visit (INDEPENDENT_AMBULATORY_CARE_PROVIDER_SITE_OTHER): Payer: Medicare Other | Admitting: Surgery

## 2020-07-21 ENCOUNTER — Encounter: Payer: Self-pay | Admitting: Surgery

## 2020-07-21 ENCOUNTER — Ambulatory Visit
Admission: RE | Admit: 2020-07-21 | Discharge: 2020-07-21 | Disposition: A | Discharge status: Home / Self Care | Payer: Medicare Other | Source: Ambulatory Visit | Attending: Surgery | Admitting: Surgery

## 2020-07-21 VITALS — BP 144/77 | HR 60 | Temp 97.3°F | Resp 20 | Ht 63.0 in | Wt 155.0 lb

## 2020-07-21 DIAGNOSIS — I2510 Atherosclerotic heart disease of native coronary artery without angina pectoris: Secondary | ICD-10-CM

## 2020-07-21 DIAGNOSIS — C3432 Malignant neoplasm of lower lobe, left bronchus or lung: Secondary | ICD-10-CM | POA: Diagnosis not present

## 2020-07-21 DIAGNOSIS — I252 Old myocardial infarction: Secondary | ICD-10-CM | POA: Diagnosis not present

## 2020-07-21 DIAGNOSIS — Z85118 Personal history of other malignant neoplasm of bronchus and lung: Secondary | ICD-10-CM

## 2020-07-21 NOTE — Progress Notes (Signed)
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 She had lymphovascular invasion but negative resection margins.ShehasCLL but it is early stage and does not require any treatment.This is followed by Dr. Quincy Sheehan medical oncology.   When I saw her in February 2021 a CT of the chest showed some enlargement of the left axillary lymph nodes with the most prominent one measuring 1.3 cm.  We decided to do a PET scan to evaluate that further and it showed mild metabolic activity associated with the left axillary lymphadenopathy but less than the background blood pool activity.  The previously seen 12 mm lymph node to decrease in size 10 mm.  It was felt the most likely this lymphadenopathy is related to her recent Moderna COVID-19 vaccine given in the left arm.  Since I last saw her in March 2021 she underwent placement of bilateral common iliac artery stents on 05/13/2020 for disabling claudication.  This was associated with a postoperative retroperitoneal bleed which she recovered from.  She says that her left leg feels fine but she is starting to develop some cramping in her right leg with ambulation.  She denies any cough or sputum production.  She has some chronic shortness of breath related to COPD but is unchanged.  Her main complaint today is bruising related to being on Plavix and Coumadin.  Current Outpatient Medications  Medication Sig Dispense Refill  . acetaminophen (TYLENOL) 500 MG tablet Take 1,000 mg by mouth every 8 (eight) hours as needed (pain.).     [name] amLODipine (NORVASC) 2.5 MG tablet Take 1 tablet (2.5 mg total) by mouth daily. 30 tablet 0  . Calcium Carb-Cholecalciferol (CALTRATE 600+D3 PO) Take 1 tablet by mouth daily at 12 noon.    . clopidogrel (PLAVIX) 75 MG tablet Take 1 tablet (75 mg total) by mouth daily. 30 tablet 6  . Coenzyme Q10 (COQ10) 100  MG CAPS Take 100 mg by mouth daily at 12 noon.    . docusate sodium (COLACE) 100 MG capsule Take 200 mg by mouth every evening.     . ezetimibe (ZETIA) 10 MG tablet Take 1 tablet (10 mg total) by mouth daily. 90 tablet 3  . fluticasone (FLONASE) 50 MCG/ACT nasal spray Place 1 spray into both nostrils at bedtime as needed for allergies or rhinitis.    . hydrocortisone valerate cream (WESTCORT) 0.2 % Apply 1 application topically 3 (three) times a week. On random days - for eczema in ear    . isosorbide mononitrate (IMDUR) 30 MG 24 hr tablet Take 1 tablet (30 mg total) by mouth 2 (two) times daily. 180 tablet 3  . lisinopril (ZESTRIL) 20 MG tablet Take 20 mg by mouth in the morning and at bedtime.    . metoprolol succinate (TOPROL-XL) 25 MG 24 hr tablet Take 25 mg by mouth daily. Take 1/2 tab in the morning and 1/2 tab in the evening    . metoprolol tartrate (LOPRESSOR) 25 MG tablet Take 25 mg by mouth daily as needed (afib).    . nitroGLYCERIN (NITROSTAT) 0.4 MG SL tablet Place 1 tablet (0.4 mg total) under the tongue every 5 (five) minutes x 3 doses as needed for chest pain. 25 tablet 1  . NON FORMULARY Place 1 drop into both eyes in the morning and at bedtime.     . pyridOXINE (VITAMIN B-6) 100 MG tablet  Take 100 mg by mouth daily at 12 noon.    . rosuvastatin (CRESTOR) 10 MG tablet TAKE 1 TABLET(10 MG) BY MOUTH DAILY (Patient taking differently: Take 10 mg by mouth every evening. ) 90 tablet 1  . umeclidinium-vilanterol (ANORO ELLIPTA) 62.5-25 MCG/INH AEPB Inhale 1 puff into the lungs daily. 1 each 6  . valACYclovir (VALTREX) 1000 MG tablet Take 2,000 mg by mouth daily as needed (fever blisters/cold sores.).     [name] vitamin B-12 (CYANOCOBALAMIN) 500 MCG tablet Take 1,000 mcg by mouth daily at 12 noon.    . warfarin (COUMADIN) 2.5 MG tablet Take 2.5 mg by mouth daily.     No current facility-administered medications for this visit.     Physical Exam: BP (!) 144/77 (BP Location: Right Arm,  Patient Position: Sitting, Cuff Size: Normal)   Pulse 60   Temp (!) 97.3 F (36.3 C)   Resp 20   Ht 5\' 3"  (1.6 m)   Wt 155 lb (70.3 kg)   SpO2 95% Comment: RA  BMI 27.46 kg/m  She looks well. There is no cervical or supraclavicular adenopathy. Cardiac exam shows regular rate and rhythm with normal heart sounds. Lung exam is clear.   Diagnostic Tests:  CLINICAL DATA:  Lung cancer.  EXAM: CT CHEST WITHOUT CONTRAST  TECHNIQUE: Multidetector CT imaging of the chest was performed following the standard protocol without IV contrast.  COMPARISON:  CT abdomen pelvis 05/14/2020 PET 02/02/2020 and CT chest 01/07/2020.  FINDINGS: Cardiovascular: Atherosclerotic calcification of the aorta and aortic valve. Heart is at the upper limits of normal in size. No pericardial effusion.  Mediastinum/Nodes: Mediastinal lymph nodes are not enlarged by CT size criteria. Hilar regions are difficult to evaluate without IV contrast. Axillary lymph nodes measure up to 11 mm on the left, unchanged. Esophagus is unremarkable.  Lungs/Pleura: Centrilobular and paraseptal emphysema. 3 mm peripheral right upper lobe nodule (8/37), unchanged. Left lower lobectomy. Chronic small left pleural effusion. Airway is otherwise unremarkable.  Upper Abdomen: Subcentimeter low-attenuation lesion in the left hepatic lobe is too small to characterize but stable and a cyst is favored. Visualized portions of the liver, gallbladder, adrenal glands, kidneys, spleen, pancreas, stomach and bowel are otherwise unremarkable with exception of a small hiatal hernia.  Musculoskeletal: Degenerative changes in the spine. Median sternotomy. No worrisome lytic or sclerotic lesions. Upper and lower thoracic compression deformities are unchanged.  IMPRESSION: 1. No evidence of recurrent or metastatic disease. 2. Chronic small left pleural effusion. 3.  Aortic atherosclerosis (ICD10-I70.0). 4.  Emphysema  (ICD10-J43.9).   Electronically Signed   By: Lorin Picket M.D.   On: 07/21/2020 11:24   Impression:  She is now 3 years out from lung cancer resection with no evidence of recurrent or metastatic disease.  I reviewed the CT images with her and answered her questions.  Plan:  I will see her back in 6 months with a CT scan of the chest without contrast for lung cancer surveillance.  I spent 20 minutes performing this established patient evaluation and > 50% of this time was spent face to face counseling and coordinating the surveillance of her previously resected lung cancer.    Gaye Pollack, MD [facility] [phone]

## 2020-07-23 ENCOUNTER — Telehealth: Payer: Self-pay | Admitting: Cardiovascular Disease

## 2020-07-23 DIAGNOSIS — D62 Acute posthemorrhagic anemia: Secondary | ICD-10-CM

## 2020-07-23 NOTE — Telephone Encounter (Signed)
Pt is called and wanted to know if Dr. Claiborne Billings still required her to take an ultrasound. Please call

## 2020-07-23 NOTE — Telephone Encounter (Signed)
Called and spoke with pt. She states her question was for Dr.Berry. she states she was initially supposed to have follow up labs on September 2nd or 3rd to follow up on her hemoglobin following her complications in the hospital but there were no orders for this. She was also told she would have another ultrasound for her aorta/ivc/iliac post procedure and she had not heard anything yet about this. Reviewed her last [place] and that Dr.Berry said to repeat in 6 months. Pt states she is concerned though because she is still having issues with her R leg and is having pain. She denies any issues with the L leg. She also states that she "needs more instructions with these tests as far as with having issues with constipation" she reports she is also a "colon pt"  Notified I would send this message to Dr.Berry to review and advise on. Pt verbalized understanding with no other questions at this time.

## 2020-07-24 NOTE — Telephone Encounter (Signed)
Her post procedure dopplers were performed 7/17 and looked fine. She should have repeat Ao Iliac dopplers 6 months from then. Her last HGB ws on 7/20 and her HGB was 10.9. I would like to see another CBC. I can see her back in the office sometime in Oct or Nov for F/U

## 2020-07-26 NOTE — Telephone Encounter (Signed)
Called and spoke with pt, reviewed Dr.Berry's recommendations. Pt has appt already scheduled on 08/24/20 at 2:00pm. Notified I would send lab slip for repeat lab. Pt verbalized understanding with no other questions at this time.

## 2020-07-28 ENCOUNTER — Ambulatory Visit: Payer: Medicare Other | Admitting: Adult Health

## 2020-08-03 ENCOUNTER — Ambulatory Visit (INDEPENDENT_AMBULATORY_CARE_PROVIDER_SITE_OTHER): Payer: Medicare Other | Admitting: Adult Health

## 2020-08-03 ENCOUNTER — Other Ambulatory Visit: Payer: Self-pay

## 2020-08-03 ENCOUNTER — Encounter: Payer: Self-pay | Admitting: Adult Health

## 2020-08-03 DIAGNOSIS — J449 Chronic obstructive pulmonary disease, unspecified: Secondary | ICD-10-CM

## 2020-08-03 DIAGNOSIS — I252 Old myocardial infarction: Secondary | ICD-10-CM

## 2020-08-03 DIAGNOSIS — I2510 Atherosclerotic heart disease of native coronary artery without angina pectoris: Secondary | ICD-10-CM

## 2020-08-03 DIAGNOSIS — Z902 Acquired absence of lung [part of]: Secondary | ICD-10-CM

## 2020-08-03 NOTE — Assessment & Plan Note (Signed)
Improved symptom burden on ANORO.  She is doing well , remains active w/ no significant flares.    Plan  Patient Instructions  Continue on Anoro daily Activity as tolerated Follow-up in 6 months and as needed with Dr Valeta Harms

## 2020-08-03 NOTE — Assessment & Plan Note (Signed)
Previous lung cancer s/p lobectomy - serial CT chest w/ recent scan showing no evidence of recurrence.

## 2020-08-03 NOTE — Progress Notes (Signed)
Thanks for seeing her Darvin Neighbours [facility] 08/03/2020 6:45 PM
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
## 2020-08-03 NOTE — Patient Instructions (Signed)
Continue on Anoro daily Activity as tolerated Follow-up in 6 months and as needed with Dr Valeta Harms

## 2020-08-09 ENCOUNTER — Telehealth: Payer: Self-pay | Admitting: Cardiovascular Disease

## 2020-08-09 ENCOUNTER — Other Ambulatory Visit: Payer: Self-pay

## 2020-08-09 ENCOUNTER — Ambulatory Visit (INDEPENDENT_AMBULATORY_CARE_PROVIDER_SITE_OTHER): Payer: Medicare Other

## 2020-08-09 DIAGNOSIS — Z5181 Encounter for therapeutic drug level monitoring: Secondary | ICD-10-CM

## 2020-08-09 DIAGNOSIS — I480 Paroxysmal atrial fibrillation: Secondary | ICD-10-CM

## 2020-08-09 LAB — POCT INR: INR: 3.2 — AB (ref 2.0–3.0)

## 2020-08-09 NOTE — Telephone Encounter (Signed)
Patient states for a few weeks she has been having sharp pain in her abdomen where her stent was put in. She states it happens more when leaning down while sitting. She states it is not chest pain. She states she also has bruising all over her body, but has no other symptoms.  ?

## 2020-08-09 NOTE — Telephone Encounter (Signed)
Returned call to pt she states that this is in her right groin, she states that she has been having "twinges in her right groin since her procedure in July" she states that she has been complaining about this since before the procedure then she was in the ICU and this "pain" continues. Pt had a pseudoaneurysm and was in the ICU and has been having problems "ever since". Informed pt that there are no sooner appt's available before her current appt. She wants to "have this taken care of this week" informed pt that Dr Gwenlyn Found is not here today but we will forward messages from triage to him. She also states that she is having bruising that is worse "all over her body". She insists that this is not from her coumadin or Plavix.   Informed pt if this pain worsens or any redness or swelling she is to go directly to there ER, verbalizes understanding.  Please advise

## 2020-08-09 NOTE — Patient Instructions (Signed)
Hold today and then Continue taking 1 tablet daily.  Repeat INR in 3 weeks. Eat more greens

## 2020-08-09 NOTE — Telephone Encounter (Signed)
Happy to see [name] back sometime the next several weeks

## 2020-08-12 LAB — CBC
Hematocrit: 41.1 % (ref 34.0–46.6)
Hemoglobin: 14 g/dL (ref 11.1–15.9)
MCH: 31.5 pg (ref 26.6–33.0)
MCHC: 34.1 g/dL (ref 31.5–35.7)
MCV: 93 fL (ref 79–97)
Platelets: 178 10*3/uL (ref 150–450)
RBC: 4.44 x10E6/uL (ref 3.77–5.28)
RDW: 12.7 % (ref 11.7–15.4)
WBC: 12 10*3/uL — ABNORMAL HIGH (ref 3.4–10.8)

## 2020-08-24 ENCOUNTER — Other Ambulatory Visit: Payer: Self-pay

## 2020-08-24 ENCOUNTER — Ambulatory Visit (INDEPENDENT_AMBULATORY_CARE_PROVIDER_SITE_OTHER): Payer: Medicare Other | Admitting: Pharmacist

## 2020-08-24 ENCOUNTER — Encounter: Payer: Self-pay | Admitting: Cardiovascular Disease

## 2020-08-24 ENCOUNTER — Ambulatory Visit (INDEPENDENT_AMBULATORY_CARE_PROVIDER_SITE_OTHER): Payer: Medicare Other | Admitting: Cardiovascular Disease

## 2020-08-24 VITALS — BP 124/66 | HR 73 | Ht 63.0 in | Wt 157.0 lb

## 2020-08-24 DIAGNOSIS — I2510 Atherosclerotic heart disease of native coronary artery without angina pectoris: Secondary | ICD-10-CM | POA: Diagnosis not present

## 2020-08-24 DIAGNOSIS — I480 Paroxysmal atrial fibrillation: Secondary | ICD-10-CM

## 2020-08-24 DIAGNOSIS — Z5181 Encounter for therapeutic drug level monitoring: Secondary | ICD-10-CM

## 2020-08-24 DIAGNOSIS — I252 Old myocardial infarction: Secondary | ICD-10-CM | POA: Diagnosis not present

## 2020-08-24 DIAGNOSIS — I739 Peripheral vascular disease, unspecified: Secondary | ICD-10-CM | POA: Diagnosis not present

## 2020-08-24 LAB — POCT INR: INR: 3 (ref 2.0–3.0)

## 2020-08-24 NOTE — Patient Instructions (Signed)
Medication Instructions:  Continue current medications  *If you need a refill on your cardiac medications before your next appointment, please call your pharmacy*   Lab Work: None Ordered   Testing/Procedures: Your physician has requested that you have an Aortic Illac test in January 2022   Follow-Up: At [facility], you and your health needs are our priority.  As part of our continuing mission to provide you with exceptional heart care, we have created designated Provider Care Teams.  These Care Teams include your primary Cardiologist (physician) and Advanced Practice Providers (APPs -  Physician Assistants and Nurse Practitioners) who all work together to provide you with the care you need, when you need it.  We recommend signing up for the patient portal called "MyChart".  Sign up information is provided on this After Visit Summary.  MyChart is used to connect with patients for Virtual Visits (Telemedicine).  Patients are able to view lab/test results, encounter notes, upcoming appointments, etc.  Non-urgent messages can be sent to your provider as well.   To learn more about what you can do with MyChart, go to [email or web address].    Your next appointment:   6 month(s)  The format for your next appointment:   In Person  Provider:   You may see Quay Burow, MD or one of the following Advanced Practice Providers on your designated Care Team:    Kerin Ransom, PA-C  [place], [place]  Coletta Memos, [place]

## 2020-08-24 NOTE — Assessment & Plan Note (Signed)
History of PAD status post bilateral iliac orbital atherectomy, PTA and covered stenting by myself 05/13/2020 for lifestyle limiting claudication.  Her Doppler studies performed 05/27/2020 revealed normal ABIs with widely patent iliac arteries.  Her procedure was complicated by retroperitoneal hematoma lengthening her stay to 4 days.  She was severely anemic post procedure but her hemoglobin is come back up to 14.  She complains of nonvascular pain in her right lower quadrant but this is slowly getting better as well.  We talked about duration of antiplatelet therapy.  Currently she is on Coumadin and Plavix.  I think we can safely stop the Plavix 1 January.

## 2020-08-24 NOTE — Progress Notes (Signed)
08/24/2020 [name]   18-Dec-1945  [id number]  Primary Physician Bridget Pink, MD Primary Cardiologist: Bridget Harp MD [name], [place], [place]  HPI:  [name] is a 74 y.o.  divorced, mother of 2, grandmother to grandchildren referred by Dr. Claiborne [name] for peripheral vascular evaluation because of lifestyle limiting claudication.I last saw her in the office 06/01/2020. She has a history of coronary artery disease status post bypass grafting 3 2008. She has COPD with 40 pack years tobacco abuse having quit 10 years ago as well as history of hypertension and hyperlipidemia. She's also had squamous cell carcinoma surgically removed with a VATS by Dr. Caffie [name] 07/02/17 with some perioperative A. fib. She denies respiratory shortness of breath. She does complain of somewhat limiting claudication. She did have remote Dopplers in 2011 revealed normal ABIs however most recent Dopplers performed 08/13/17 revealed a right ABI of 0.84 and a leftABI of .72.She did have mild to moderate iliac disease.  She is limited somewhat by shortness of breath from her COPD but she also has progressive lifestyle limiting claudication.Dopplers performed 04/22/2020 revealed a right ABI of 1.01 a left of 0.83 with high-frequency signals in both iliac arteries.Based on this, we decided to proceed with angiography potential endovascular therapy for lifestyle limiting claudication.  I performed angiography on 05/13/2020 revealing high-grade calcified ostial bilateral iliac stenoses. I performed orbital atherectomy, PTA and covered stenting using "kissing stent technique of both iliac arteries with excellent angiographic result. Unfortunately, that night she developed back pain and hypotension. CT scan showed a retroperitoneal bleed. She was transferred to the ICU where she was observed for the next several days. Hemoglobin stabilized. She ambulated prior to discharge on the fourth. She says that her  legs feel "heavy" but there is no edema. She does have a significant ecchymoses in both groins, suprapubic area and flanks. Her hemoglobin was 10.9 yesterday.    Her Dopplers performed on 05/29/2020 have normalized with normal ABIs and velocities.    She restarted her Coumadin since I saw her last.  Her hemoglobin has come up to 14.  Her claudication has resolved although she has been complaining of right lower quadrant discomfort which does not sound vascular.  She is complaining of excessive ecchymosis probably related to the combination of Coumadin and Plavix.  Told her she can discontinue Plavix after the first the year.   Current Meds  Medication Sig  . acetaminophen (TYLENOL) 500 MG tablet Take 1,000 mg by mouth every 8 (eight) hours as needed (pain.).   [name] amLODipine (NORVASC) 2.5 MG tablet Take 1 tablet (2.5 mg total) by mouth daily.  . Calcium Carb-Cholecalciferol (CALTRATE 600+D3 PO) Take 1 tablet by mouth daily at 12 noon.  . clopidogrel (PLAVIX) 75 MG tablet Take 1 tablet (75 mg total) by mouth daily.  . Coenzyme Q10 (COQ10) 100 MG CAPS Take 100 mg by mouth daily at 12 noon.  . docusate sodium (COLACE) 100 MG capsule Take 200 mg by mouth every evening.   . fluticasone (FLONASE) 50 MCG/ACT nasal spray Place 1 spray into both nostrils at bedtime as needed for allergies or rhinitis.  . hydrocortisone valerate cream (WESTCORT) 0.2 % Apply 1 application topically 3 (three) times a week. On random days - for eczema in ear  . isosorbide mononitrate (IMDUR) 30 MG 24 hr tablet Take 1 tablet (30 mg total) by mouth 2 (two) times daily.  [name] lisinopril (ZESTRIL) 20 MG tablet Take 20 mg by mouth in  the morning and at bedtime.  . metoprolol succinate (TOPROL-XL) 25 MG 24 hr tablet Take 25 mg by mouth daily. Take 1/2 tab in the morning and 1/2 tab in the evening  . metoprolol tartrate (LOPRESSOR) 25 MG tablet Take 25 mg by mouth daily as needed (afib).  . nitroGLYCERIN (NITROSTAT) 0.4 MG SL tablet  Place 1 tablet (0.4 mg total) under the tongue every 5 (five) minutes x 3 doses as needed for chest pain.  . NON FORMULARY Place 1 drop into both eyes in the morning and at bedtime.   . pyridOXINE (VITAMIN B-6) 100 MG tablet Take 100 mg by mouth daily at 12 noon.  . rosuvastatin (CRESTOR) 10 MG tablet TAKE 1 TABLET(10 MG) BY MOUTH DAILY (Patient taking differently: Take 10 mg by mouth every evening. )  . umeclidinium-vilanterol (ANORO ELLIPTA) 62.5-25 MCG/INH AEPB Inhale 1 puff into the lungs daily.  . valACYclovir (VALTREX) 1000 MG tablet Take 2,000 mg by mouth daily as needed (fever blisters/cold sores.).   [name] vitamin B-12 (CYANOCOBALAMIN) 500 MCG tablet Take 1,000 mcg by mouth daily at 12 noon.  . warfarin (COUMADIN) 2.5 MG tablet Take 2.5 mg by mouth daily.     Allergies  Allergen Reactions  . Amiodarone Other (See Comments)    angioedema  . Clindamycin/Lincomycin Swelling    TROUBLE SWALLOWING......SEVERE CHEST PAIN  . Doxycycline Other (See Comments)    blistering  . Sulfa Antibiotics Photosensitivity, Rash and Other (See Comments)    Red, burning rash & paralysis Burning Rash  . Lipitor [Atorvastatin] Other (See Comments)    MYALGIAS LEG PAIN  . Phenergan [Promethazine Hcl] Other (See Comments)    Nervous Leg / Restless Leg Syndrome  . Reclast [Zoledronic Acid] Other (See Comments)    Flu symptoms - made pt very sick, and had inflammation  In her eye  . Ketorolac Nausea Only    headache  . Diltiazem Other (See Comments)    Weakness on oral Dilt  . Latex Rash    Social History   Socioeconomic History  . Marital status: Divorced    Spouse name: Not on file  . Number of children: 2  . Years of education: Not on file  . Highest education level: Not on file  Occupational History  . Occupation: Retired  Tobacco Use  . Smoking status: Former Smoker    Packs/day: 1.00    Years: 40.00    Pack years: 40.00    Types: Cigarettes    Quit date: 09/15/2007    Years since  quitting: 12.9  . Smokeless tobacco: Never Used  Vaping Use  . Vaping Use: Never used  Substance and Sexual Activity  . Alcohol use: Yes    Alcohol/week: 6.0 standard drinks    Types: 6 Glasses of wine per week  . Drug use: No  . Sexual activity: Never  Other Topics Concern  . Not on file  Social History Narrative   She lives in [place], [place]. Her daughter helps with her care.    Social Determinants of Health   Financial Resource Strain:   . Difficulty of Paying Living Expenses: Not on file  Food Insecurity:   . Worried About Charity fundraiser in the Last Year: Not on file  . Ran Out of Food in the Last Year: Not on file  Transportation Needs:   . Lack of Transportation (Medical): Not on file  . Lack of Transportation (Non-Medical): Not on file  Physical Activity:   . Days  of Exercise per Week: Not on file  . Minutes of Exercise per Session: Not on file  Stress:   . Feeling of Stress : Not on file  Social Connections:   . Frequency of Communication with Friends and Family: Not on file  . Frequency of Social Gatherings with Friends and Family: Not on file  . Attends Religious Services: Not on file  . Active Member of Clubs or Organizations: Not on file  . Attends Archivist Meetings: Not on file  . Marital Status: Not on file  Intimate Partner Violence:   . Fear of Current or Ex-Partner: Not on file  . Emotionally Abused: Not on file  . Physically Abused: Not on file  . Sexually Abused: Not on file     Review of Systems: General: negative for chills, fever, night sweats or weight changes.  Cardiovascular: negative for chest pain, dyspnea on exertion, edema, orthopnea, palpitations, paroxysmal nocturnal dyspnea or shortness of breath Dermatological: negative for rash Respiratory: negative for cough or wheezing Urologic: negative for hematuria Abdominal: negative for nausea, vomiting, diarrhea, bright red blood per rectum, melena, or  hematemesis Neurologic: negative for visual changes, syncope, or dizziness All other systems reviewed and are otherwise negative except as noted above.    Blood pressure 124/66, pulse 73, height 5\' 3"  (1.6 m), weight 157 lb (71.2 kg), SpO2 98 %.  General appearance: alert and no distress Neck: no adenopathy, no JVD, supple, symmetrical, trachea midline, thyroid not enlarged, symmetric, no tenderness/mass/nodules and Soft left carotid bruit Lungs: clear to auscultation bilaterally Heart: regular rate and rhythm, S1, S2 normal, no murmur, click, rub or gallop Extremities: extremities normal, atraumatic, no cyanosis or edema Pulses: 2+ and symmetric Skin: Skin color, texture, turgor normal. No rashes or lesions Neurologic: Alert and oriented X 3, normal strength and tone. Normal symmetric reflexes. Normal coordination and gait  EKG not performed today  ASSESSMENT AND PLAN:   PAD (peripheral artery disease) (HCC) History of PAD status post bilateral iliac orbital atherectomy, PTA and covered stenting by myself 05/13/2020 for lifestyle limiting claudication.  Her Doppler studies performed 05/27/2020 revealed normal ABIs with widely patent iliac arteries.  Her procedure was complicated by retroperitoneal hematoma lengthening her stay to 4 days.  She was severely anemic post procedure but her hemoglobin is come back up to 14.  She complains of nonvascular pain in her right lower quadrant but this is slowly getting better as well.  We talked about duration of antiplatelet therapy.  Currently she is on Coumadin and Plavix.  I think we can safely stop the Plavix 1 January.      Bridget Harp MD FACP,FACC,FAHA, [facility] 08/24/2020 1:52 PM

## 2020-09-06 ENCOUNTER — Other Ambulatory Visit: Payer: Self-pay | Admitting: Cardiovascular Disease

## 2020-09-08 ENCOUNTER — Other Ambulatory Visit: Payer: Self-pay

## 2020-09-08 ENCOUNTER — Ambulatory Visit (INDEPENDENT_AMBULATORY_CARE_PROVIDER_SITE_OTHER): Payer: Medicare Other | Admitting: Pharmacist Clinician (PhC)/ Clinical Pharmacy Specialist

## 2020-09-08 DIAGNOSIS — Z5181 Encounter for therapeutic drug level monitoring: Secondary | ICD-10-CM | POA: Diagnosis not present

## 2020-09-08 DIAGNOSIS — I480 Paroxysmal atrial fibrillation: Secondary | ICD-10-CM | POA: Diagnosis not present

## 2020-09-08 LAB — POCT INR: INR: 2.4 (ref 2.0–3.0)

## 2020-09-09 DIAGNOSIS — Z800 Family history of malignant neoplasm of digestive organs: Secondary | ICD-10-CM | POA: Insufficient documentation

## 2020-09-09 DIAGNOSIS — Z86010 Personal history of colonic polyps: Secondary | ICD-10-CM | POA: Insufficient documentation

## 2020-09-09 DIAGNOSIS — Z7901 Long term (current) use of anticoagulants: Secondary | ICD-10-CM | POA: Insufficient documentation

## 2020-09-09 DIAGNOSIS — K5730 Diverticulosis of large intestine without perforation or abscess without bleeding: Secondary | ICD-10-CM | POA: Insufficient documentation

## 2020-09-10 ENCOUNTER — Encounter: Payer: Self-pay | Admitting: Oncology

## 2020-09-10 ENCOUNTER — Inpatient Hospital Stay: Payer: Medicare Other

## 2020-09-10 ENCOUNTER — Other Ambulatory Visit: Payer: Self-pay

## 2020-09-10 ENCOUNTER — Inpatient Hospital Stay: Payer: Medicare Other | Attending: Oncology | Admitting: Oncology

## 2020-09-10 VITALS — BP 130/69 | HR 55 | Temp 97.1°F | Resp 16 | Wt 157.4 lb

## 2020-09-10 DIAGNOSIS — Z85118 Personal history of other malignant neoplasm of bronchus and lung: Secondary | ICD-10-CM | POA: Diagnosis not present

## 2020-09-10 DIAGNOSIS — M8580 Other specified disorders of bone density and structure, unspecified site: Secondary | ICD-10-CM | POA: Insufficient documentation

## 2020-09-10 DIAGNOSIS — E7800 Pure hypercholesterolemia, unspecified: Secondary | ICD-10-CM | POA: Diagnosis not present

## 2020-09-10 DIAGNOSIS — Z7901 Long term (current) use of anticoagulants: Secondary | ICD-10-CM | POA: Diagnosis not present

## 2020-09-10 DIAGNOSIS — C9110 Chronic lymphocytic leukemia of B-cell type not having achieved remission: Secondary | ICD-10-CM

## 2020-09-10 DIAGNOSIS — Z79899 Other long term (current) drug therapy: Secondary | ICD-10-CM | POA: Insufficient documentation

## 2020-09-10 DIAGNOSIS — Z85828 Personal history of other malignant neoplasm of skin: Secondary | ICD-10-CM | POA: Insufficient documentation

## 2020-09-10 DIAGNOSIS — I110 Hypertensive heart disease with heart failure: Secondary | ICD-10-CM | POA: Diagnosis not present

## 2020-09-10 DIAGNOSIS — M3500 Sicca syndrome, unspecified: Secondary | ICD-10-CM | POA: Diagnosis not present

## 2020-09-10 DIAGNOSIS — I509 Heart failure, unspecified: Secondary | ICD-10-CM | POA: Insufficient documentation

## 2020-09-10 DIAGNOSIS — Z87891 Personal history of nicotine dependence: Secondary | ICD-10-CM | POA: Insufficient documentation

## 2020-09-10 DIAGNOSIS — Z933 Colostomy status: Secondary | ICD-10-CM | POA: Diagnosis not present

## 2020-09-10 DIAGNOSIS — I2510 Atherosclerotic heart disease of native coronary artery without angina pectoris: Secondary | ICD-10-CM

## 2020-09-10 DIAGNOSIS — I480 Paroxysmal atrial fibrillation: Secondary | ICD-10-CM | POA: Insufficient documentation

## 2020-09-10 DIAGNOSIS — J449 Chronic obstructive pulmonary disease, unspecified: Secondary | ICD-10-CM | POA: Diagnosis not present

## 2020-09-10 DIAGNOSIS — E785 Hyperlipidemia, unspecified: Secondary | ICD-10-CM | POA: Insufficient documentation

## 2020-09-10 DIAGNOSIS — I252 Old myocardial infarction: Secondary | ICD-10-CM | POA: Diagnosis not present

## 2020-09-10 LAB — CBC WITH DIFFERENTIAL/PLATELET
Abs Immature Granulocytes: 0.05 10*3/uL (ref 0.00–0.07)
Basophils Absolute: 0.1 10*3/uL (ref 0.0–0.1)
Basophils Relative: 0 %
Eosinophils Absolute: 0.1 10*3/uL (ref 0.0–0.5)
Eosinophils Relative: 1 %
HCT: 39.2 % (ref 36.0–46.0)
Hemoglobin: 13.5 g/dL (ref 12.0–15.0)
Immature Granulocytes: 0 %
Lymphocytes Relative: 59 %
Lymphs Abs: 6.9 10*3/uL — ABNORMAL HIGH (ref 0.7–4.0)
MCH: 30.8 pg (ref 26.0–34.0)
MCHC: 34.4 g/dL (ref 30.0–36.0)
MCV: 89.5 fL (ref 80.0–100.0)
Monocytes Absolute: 0.6 10*3/uL (ref 0.1–1.0)
Monocytes Relative: 5 %
Neutro Abs: 4.1 10*3/uL (ref 1.7–7.7)
Neutrophils Relative %: 35 %
Platelets: 162 10*3/uL (ref 150–400)
RBC: 4.38 MIL/uL (ref 3.87–5.11)
RDW: 13.1 % (ref 11.5–15.5)
WBC: 11.7 10*3/uL — ABNORMAL HIGH (ref 4.0–10.5)
nRBC: 0 % (ref 0.0–0.2)

## 2020-09-10 LAB — COMPREHENSIVE METABOLIC PANEL
ALT: 18 U/L (ref 0–44)
AST: 20 U/L (ref 15–41)
Albumin: 4 g/dL (ref 3.5–5.0)
Alkaline Phosphatase: 45 U/L (ref 38–126)
Anion gap: 6 (ref 5–15)
BUN: 16 mg/dL (ref 8–23)
CO2: 27 mmol/L (ref 22–32)
Calcium: 8.6 mg/dL — ABNORMAL LOW (ref 8.9–10.3)
Chloride: 100 mmol/L (ref 98–111)
Creatinine, Ser: 0.56 mg/dL (ref 0.44–1.00)
GFR, Estimated: >60 mL/min (ref >60)
Glucose, Bld: 97 mg/dL (ref 70–99)
Potassium: 4.3 mmol/L (ref 3.5–5.1)
Sodium: 133 mmol/L — ABNORMAL LOW (ref 135–145)
Total Bilirubin: 0.7 mg/dL (ref 0.3–1.2)
Total Protein: 6.3 g/dL — ABNORMAL LOW (ref 6.5–8.1)

## 2020-09-13 ENCOUNTER — Other Ambulatory Visit: Payer: Self-pay | Admitting: Cardiovascular Disease
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
## 2020-09-14 ENCOUNTER — Telehealth: Payer: Self-pay | Admitting: *Deleted

## 2020-09-14 NOTE — Telephone Encounter (Signed)
I will call her today

## 2020-09-14 NOTE — Telephone Encounter (Signed)
Patient called stating that she has about 7 questions regarding her lab results and that she does not see Dr Janese Banks for a while. She is asking if she needs to make an appointment to get these answered or if she should just send a My Chart message with the questions. Please advise your preference

## 2020-09-17 ENCOUNTER — Ambulatory Visit: Payer: Medicare Other | Admitting: Oncology

## 2020-09-17 ENCOUNTER — Other Ambulatory Visit: Payer: Medicare Other

## 2020-09-29 ENCOUNTER — Ambulatory Visit (INDEPENDENT_AMBULATORY_CARE_PROVIDER_SITE_OTHER): Payer: Medicare Other

## 2020-09-29 DIAGNOSIS — I480 Paroxysmal atrial fibrillation: Secondary | ICD-10-CM | POA: Diagnosis not present

## 2020-09-29 DIAGNOSIS — Z5181 Encounter for therapeutic drug level monitoring: Secondary | ICD-10-CM

## 2020-09-29 LAB — POCT INR: INR: 3.7 — AB (ref 2.0–3.0)

## 2020-09-29 NOTE — Patient Instructions (Signed)
Hold dose today and then continue warfarin dose 2.5mg  daily except for 1.25mg  every Tuesday. Repeat INR in 3 weeks.

## 2020-10-11 DIAGNOSIS — T7840XA Allergy, unspecified, initial encounter: Secondary | ICD-10-CM | POA: Insufficient documentation

## 2020-10-11 DIAGNOSIS — M810 Age-related osteoporosis without current pathological fracture: Secondary | ICD-10-CM | POA: Insufficient documentation

## 2020-10-11 DIAGNOSIS — H04123 Dry eye syndrome of bilateral lacrimal glands: Secondary | ICD-10-CM | POA: Insufficient documentation

## 2020-10-11 DIAGNOSIS — S22009A Unspecified fracture of unspecified thoracic vertebra, initial encounter for closed fracture: Secondary | ICD-10-CM | POA: Insufficient documentation

## 2020-10-11 DIAGNOSIS — C4490 Unspecified malignant neoplasm of skin, unspecified: Secondary | ICD-10-CM | POA: Insufficient documentation

## 2020-10-11 DIAGNOSIS — M6530 Trigger finger, unspecified finger: Secondary | ICD-10-CM | POA: Insufficient documentation

## 2020-10-11 DIAGNOSIS — N950 Postmenopausal bleeding: Secondary | ICD-10-CM | POA: Insufficient documentation

## 2020-10-12 ENCOUNTER — Encounter: Payer: Self-pay | Admitting: Podiatry

## 2020-10-12 ENCOUNTER — Ambulatory Visit (INDEPENDENT_AMBULATORY_CARE_PROVIDER_SITE_OTHER): Payer: Medicare Other | Admitting: Podiatry

## 2020-10-12 ENCOUNTER — Other Ambulatory Visit: Payer: Self-pay

## 2020-10-12 DIAGNOSIS — S90112A Contusion of left great toe without damage to nail, initial encounter: Secondary | ICD-10-CM | POA: Diagnosis not present
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
## 2020-10-13 ENCOUNTER — Telehealth: Payer: Self-pay | Admitting: *Deleted

## 2020-10-13 NOTE — Telephone Encounter (Signed)
Patient called stating that Dr Janese Banks told her that there is only one blood test that has not been checked yet. She is asking what that test is and would like for it to be done. Please advise

## 2020-10-14 NOTE — Telephone Encounter (Signed)
I do not recall anything specific. It is possible that I mentioned checking her thyroid with next set of labs. Last one was checked in June and was normal. She does not need any particular test for cll at this time

## 2020-10-15 ENCOUNTER — Ambulatory Visit (INDEPENDENT_AMBULATORY_CARE_PROVIDER_SITE_OTHER): Payer: Medicare Other

## 2020-10-15 ENCOUNTER — Other Ambulatory Visit: Payer: Self-pay

## 2020-10-15 DIAGNOSIS — Z5181 Encounter for therapeutic drug level monitoring: Secondary | ICD-10-CM

## 2020-10-15 DIAGNOSIS — I480 Paroxysmal atrial fibrillation: Secondary | ICD-10-CM

## 2020-10-15 LAB — POCT INR: INR: 2.6 (ref 2.0–3.0)

## 2020-10-15 NOTE — Patient Instructions (Signed)
continue warfarin dose 2.5mg  daily except for 1.25mg  every Tuesday. Repeat INR in 5 weeks.

## 2020-10-18 ENCOUNTER — Telehealth: Payer: Self-pay | Admitting: *Deleted

## 2020-10-18 MED ORDER — CIPROFLOXACIN HCL 500 MG PO TABS: 500.0000 mg | ORAL_TABLET | Freq: Two times a day (BID) | ORAL | 0 refills | Status: DC

## 2020-10-18 MED ORDER — LINEZOLID 600 MG PO TABS: 600.0000 mg | ORAL_TABLET | Freq: Two times a day (BID) | ORAL | 0 refills | Status: DC

## 2020-10-18 NOTE — Telephone Encounter (Signed)
We can do zyvox and cipro

## 2020-10-18 NOTE — Addendum Note (Signed)
Addended by: Boneta Lucks on: 10/18/2020 11:11 AM   Modules accepted: Orders

## 2020-10-18 NOTE — Telephone Encounter (Signed)
"  Dr. Posey Pronto had offered me a prescription for Doxycycline and I told him I had a reaction.  I couldn't remember the reaction.  It wasn't severe.  I had a blister.  So, I'm willing to try it or something else.  My toe isn't getting any better and it actually hurts all the way through my toe.  It actually feels worse.  I use [facility] at the corner of [address]."

## 2020-10-20 ENCOUNTER — Telehealth: Payer: Self-pay | Admitting: *Deleted

## 2020-10-20 NOTE — Telephone Encounter (Signed)
Patient called to request the name of the tests that were not done for CLL. She stated she had called for this information last week. Writer reviewed Dr. Elroy Channel note from 10/13/2020 with patient stating that no further testing  For CLL was planned at this time and encouraged to call back if she had further questions. Left Message.

## 2020-10-21 ENCOUNTER — Other Ambulatory Visit: Payer: Self-pay

## 2020-10-21 ENCOUNTER — Encounter: Payer: Self-pay | Admitting: Podiatry

## 2020-10-21 ENCOUNTER — Ambulatory Visit (INDEPENDENT_AMBULATORY_CARE_PROVIDER_SITE_OTHER): Payer: Medicare Other | Admitting: Podiatry

## 2020-10-21 DIAGNOSIS — I739 Peripheral vascular disease, unspecified: Secondary | ICD-10-CM | POA: Diagnosis not present

## 2020-10-21 DIAGNOSIS — S90112A Contusion of left great toe without damage to nail, initial encounter: Secondary | ICD-10-CM | POA: Diagnosis not present
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
## 2020-10-22 ENCOUNTER — Ambulatory Visit (INDEPENDENT_AMBULATORY_CARE_PROVIDER_SITE_OTHER): Payer: Medicare Other

## 2020-10-22 DIAGNOSIS — I739 Peripheral vascular disease, unspecified: Secondary | ICD-10-CM | POA: Diagnosis not present

## 2020-10-26 ENCOUNTER — Telehealth: Payer: Self-pay | Admitting: *Deleted

## 2020-10-26 NOTE — Telephone Encounter (Signed)
Patient called reporting that she did not get a return call last week about a test that has not been done yet form [facility], She is also requesting an referral to a CLL specialist at [facility] a Dr Lanette Hampshire just to establish in case she progresses in the future.

## 2020-10-26 NOTE — Telephone Encounter (Signed)
Can you place the referral to [facility]?

## 2020-10-27 ENCOUNTER — Other Ambulatory Visit: Payer: Self-pay

## 2020-10-27 DIAGNOSIS — C9110 Chronic lymphocytic leukemia of B-cell type not having achieved remission: Secondary | ICD-10-CM

## 2020-11-01 ENCOUNTER — Ambulatory Visit (INDEPENDENT_AMBULATORY_CARE_PROVIDER_SITE_OTHER): Payer: Medicare Other

## 2020-11-01 ENCOUNTER — Other Ambulatory Visit: Payer: Self-pay

## 2020-11-01 DIAGNOSIS — Z5181 Encounter for therapeutic drug level monitoring: Secondary | ICD-10-CM | POA: Diagnosis not present

## 2020-11-01 DIAGNOSIS — I480 Paroxysmal atrial fibrillation: Secondary | ICD-10-CM

## 2020-11-01 LAB — POCT INR: INR: 2.7 (ref 2.0–3.0)

## 2020-11-01 NOTE — Patient Instructions (Signed)
continue warfarin dose 2.5mg  daily except for 1.25mg  every Tuesday. Repeat INR in 5 weeks. Eat greens over the next 2 days

## 2020-11-03 ENCOUNTER — Telehealth: Payer: Self-pay | Admitting: *Deleted

## 2020-11-03 ENCOUNTER — Telehealth: Payer: Self-pay | Admitting: Cardiovascular Disease

## 2020-11-03 NOTE — Telephone Encounter (Signed)
Called to reiterate instructions for preparation before ultrasound of aorta, IVC and iliacs. Instructions also sent to patient via MyChart. Patient verbalizes understanding and appreciation for the call.

## 2020-11-03 NOTE — Telephone Encounter (Signed)
I called [facility] and spoke to new pt. Coordinator _ Amy and she said that she left a message for pt. Yest. To have her call back and she will arrange an appt. I then called pt and she was on her mobile phone and I told her the message above and she did not have the number. I texted to her phone the phone number and who to ask for to scheduled new patient appt. She was thankful for the information and will do that.

## 2020-11-03 NOTE — Telephone Encounter (Signed)
Referral was made and I called today to check on status of appt. I was told by Amy that she called her yest. Got her voicemail and left a message to call her back for a new patient appt. I called pt today and she did not listen to her messages and she was in a store and could not take the number. I texted the patient the details that amy gave me so patient can call and get appt. Pt. Agreeable to this plan

## 2020-11-03 NOTE — Telephone Encounter (Signed)
Patient is requesting to review instructions for doppler on 11/17/19. She states she has specific questions regarding the fasting and the foods to avoid 24 hours prior. Please call.

## 2020-11-04 ENCOUNTER — Telehealth: Payer: Self-pay | Admitting: Cardiovascular Disease

## 2020-11-04 NOTE — Telephone Encounter (Signed)
Can DC plavix on 11/13/20

## 2020-11-04 NOTE — Telephone Encounter (Signed)
Patient calling to schedule an appointment with Dr. Gwenlyn Found. She states she needs to see him a few weeks after her dopplers 11/16/2020, but I did not see anything until 01/04/2021. I offered to schedule her with a PA, but she declined. She would like to know if she can be worked in to see Dr. Gwenlyn Found.

## 2020-11-04 NOTE — Telephone Encounter (Signed)
Pt stating that her documented follow up recommendation with Dr. Gwenlyn Found for 6 months after office visit on 08/24/20 is incorrect, and she should be seeing him in January. Pt will only make appointment with Dr. Gwenlyn Found. Advised pt that the earliest available appointment is in February, and appointment was made for January 04, 2021 at 3:45 p.m.  Pt is also wondering whether she will be able to discontinue her Plavix soon. She requests that Dr. Gwenlyn Found personally speak with the patient to communicate his decision, which she prefers to be made after the results of her ultrasounds come back. Advised the pt that I would let Dr. Gwenlyn Found know of her request, and assured the pt that the recommendation would not have to wait until her office visit.

## 2020-11-04 NOTE — Telephone Encounter (Signed)
Pt c/o medication issue:  1. Name of Medication: clopidogrel (PLAVIX) 75 MG tablet  2. How are you currently taking this medication (dosage and times per day)? 1 tablet daily  3. Are you having a reaction (difficulty breathing--STAT)? no  4. What is your medication issue? Received message from Judson Roch stating the patient was recommended to follow up with Dr. Gwenlyn Found in 6 months and that if she needed to be seen sooner than this after receiving her test results, she would be contacted to schedule sooner. Called patient back to inform her. Patient states she also needs to know if she needs to continue or stop taking the medication plavis. She states she was told that she would need to stay on the medication for 6 months and she started it in July. Patient was frustrated and states she needs to see Dr. Gwenlyn Found sooner than April, because 6 months from July is January. She states she needs to speak with him.

## 2020-11-09 ENCOUNTER — Ambulatory Visit (INDEPENDENT_AMBULATORY_CARE_PROVIDER_SITE_OTHER): Payer: Medicare Other | Admitting: Podiatry

## 2020-11-09 ENCOUNTER — Other Ambulatory Visit: Payer: Self-pay

## 2020-11-09 ENCOUNTER — Encounter: Payer: Self-pay | Admitting: Podiatry

## 2020-11-09 DIAGNOSIS — I739 Peripheral vascular disease, unspecified: Secondary | ICD-10-CM

## 2020-11-09 DIAGNOSIS — S90112A Contusion of left great toe without damage to nail, initial encounter: Secondary | ICD-10-CM
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
## 2020-11-16 ENCOUNTER — Ambulatory Visit (HOSPITAL_BASED_OUTPATIENT_CLINIC_OR_DEPARTMENT_OTHER)
Admission: RE | Admit: 2020-11-16 | Discharge: 2020-11-16 | Disposition: A | Discharge status: Home / Self Care | Payer: Medicare Other | Source: Ambulatory Visit | Attending: Cardiovascular Disease | Admitting: Cardiovascular Disease

## 2020-11-16 ENCOUNTER — Ambulatory Visit (HOSPITAL_COMMUNITY)
Admission: RE | Admit: 2020-11-16 | Discharge: 2020-11-16 | Disposition: A | Discharge status: Home / Self Care | Payer: Medicare Other | Source: Ambulatory Visit | Attending: Cardiovascular Disease | Admitting: Cardiovascular Disease

## 2020-11-16 ENCOUNTER — Other Ambulatory Visit: Payer: Self-pay

## 2020-11-16 ENCOUNTER — Other Ambulatory Visit (HOSPITAL_COMMUNITY): Payer: Self-pay | Admitting: Cardiovascular Disease

## 2020-11-16 DIAGNOSIS — I739 Peripheral vascular disease, unspecified: Secondary | ICD-10-CM | POA: Diagnosis not present

## 2020-11-16 DIAGNOSIS — Z95828 Presence of other vascular implants and grafts: Secondary | ICD-10-CM

## 2020-11-17 ENCOUNTER — Other Ambulatory Visit: Payer: Self-pay

## 2020-11-17 NOTE — Progress Notes (Signed)
Plavix d/c from pt's medication list.

## 2020-11-26 ENCOUNTER — Ambulatory Visit (INDEPENDENT_AMBULATORY_CARE_PROVIDER_SITE_OTHER): Payer: Medicare Other

## 2020-11-26 ENCOUNTER — Other Ambulatory Visit: Payer: Self-pay

## 2020-11-26 DIAGNOSIS — I480 Paroxysmal atrial fibrillation: Secondary | ICD-10-CM

## 2020-11-26 DIAGNOSIS — Z5181 Encounter for therapeutic drug level monitoring: Secondary | ICD-10-CM

## 2020-11-26 LAB — POCT INR: INR: 2.1 (ref 2.0–3.0)

## 2020-11-26 NOTE — Patient Instructions (Signed)
continue warfarin dose 2.5mg  daily except for 1.25mg  every Tuesday. Repeat INR in 4 weeks.

## 2020-11-30 ENCOUNTER — Ambulatory Visit (INDEPENDENT_AMBULATORY_CARE_PROVIDER_SITE_OTHER): Payer: Medicare Other | Admitting: Podiatry

## 2020-11-30 ENCOUNTER — Other Ambulatory Visit: Payer: Self-pay

## 2020-11-30 ENCOUNTER — Encounter: Payer: Self-pay | Admitting: Podiatry

## 2020-11-30 DIAGNOSIS — L603 Nail dystrophy: Secondary | ICD-10-CM | POA: Diagnosis not present

## 2020-11-30 DIAGNOSIS — I739 Peripheral vascular disease, unspecified: Secondary | ICD-10-CM

## 2020-11-30 DIAGNOSIS — B351 Tinea unguium: Secondary | ICD-10-CM

## 2020-12-01 ENCOUNTER — Encounter: Payer: Self-pay | Admitting: Podiatry
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
## 2020-12-07 ENCOUNTER — Other Ambulatory Visit: Payer: Self-pay

## 2020-12-07 MED ORDER — ROSUVASTATIN CALCIUM 10 MG PO TABS: ORAL_TABLET | ORAL | 3 refills | Status: DC

## 2020-12-09 DIAGNOSIS — Z9049 Acquired absence of other specified parts of digestive tract: Secondary | ICD-10-CM | POA: Diagnosis not present

## 2020-12-09 DIAGNOSIS — Z86010 Personal history of colonic polyps: Secondary | ICD-10-CM | POA: Diagnosis not present

## 2020-12-09 DIAGNOSIS — K5730 Diverticulosis of large intestine without perforation or abscess without bleeding: Secondary | ICD-10-CM | POA: Diagnosis not present

## 2020-12-09 DIAGNOSIS — Z7901 Long term (current) use of anticoagulants: Secondary | ICD-10-CM | POA: Diagnosis not present

## 2020-12-09 DIAGNOSIS — Z800 Family history of malignant neoplasm of digestive organs: Secondary | ICD-10-CM | POA: Diagnosis not present

## 2020-12-10 DIAGNOSIS — Z1231 Encounter for screening mammogram for malignant neoplasm of breast: Secondary | ICD-10-CM | POA: Diagnosis not present

## 2020-12-15 ENCOUNTER — Other Ambulatory Visit: Payer: Self-pay | Admitting: *Deleted

## 2020-12-15 DIAGNOSIS — Z08 Encounter for follow-up examination after completed treatment for malignant neoplasm: Secondary | ICD-10-CM

## 2020-12-20 ENCOUNTER — Ambulatory Visit (INDEPENDENT_AMBULATORY_CARE_PROVIDER_SITE_OTHER): Payer: Medicare Other

## 2020-12-20 ENCOUNTER — Other Ambulatory Visit: Payer: Self-pay

## 2020-12-20 DIAGNOSIS — I480 Paroxysmal atrial fibrillation: Secondary | ICD-10-CM | POA: Diagnosis not present

## 2020-12-20 DIAGNOSIS — Z5181 Encounter for therapeutic drug level monitoring: Secondary | ICD-10-CM

## 2020-12-20 LAB — POCT INR: INR: 2.2 (ref 2.0–3.0)

## 2020-12-20 NOTE — Patient Instructions (Signed)
continue warfarin dose 2.5mg  daily except for 1.25mg  every Tuesday. Repeat INR in 3 weeks.

## 2020-12-22 ENCOUNTER — Encounter: Payer: Self-pay | Admitting: *Deleted

## 2020-12-22 DIAGNOSIS — I10 Essential (primary) hypertension: Secondary | ICD-10-CM | POA: Diagnosis not present

## 2020-12-22 DIAGNOSIS — E785 Hyperlipidemia, unspecified: Secondary | ICD-10-CM | POA: Diagnosis not present

## 2020-12-22 DIAGNOSIS — I739 Peripheral vascular disease, unspecified: Secondary | ICD-10-CM | POA: Diagnosis not present

## 2020-12-22 DIAGNOSIS — I2510 Atherosclerotic heart disease of native coronary artery without angina pectoris: Secondary | ICD-10-CM | POA: Diagnosis not present

## 2020-12-24 DIAGNOSIS — R0609 Other forms of dyspnea: Secondary | ICD-10-CM | POA: Diagnosis not present

## 2020-12-24 DIAGNOSIS — I350 Nonrheumatic aortic (valve) stenosis: Secondary | ICD-10-CM | POA: Diagnosis not present

## 2020-12-24 DIAGNOSIS — M3500 Sicca syndrome, unspecified: Secondary | ICD-10-CM | POA: Diagnosis not present

## 2020-12-24 DIAGNOSIS — Z9049 Acquired absence of other specified parts of digestive tract: Secondary | ICD-10-CM | POA: Diagnosis not present

## 2020-12-24 DIAGNOSIS — I272 Pulmonary hypertension, unspecified: Secondary | ICD-10-CM | POA: Diagnosis not present

## 2020-12-24 DIAGNOSIS — R5383 Other fatigue: Secondary | ICD-10-CM | POA: Diagnosis not present

## 2020-12-24 DIAGNOSIS — Z951 Presence of aortocoronary bypass graft: Secondary | ICD-10-CM | POA: Diagnosis not present

## 2020-12-24 DIAGNOSIS — I2510 Atherosclerotic heart disease of native coronary artery without angina pectoris: Secondary | ICD-10-CM | POA: Diagnosis not present

## 2020-12-24 DIAGNOSIS — Z602 Problems related to living alone: Secondary | ICD-10-CM | POA: Diagnosis not present

## 2020-12-24 DIAGNOSIS — Z87891 Personal history of nicotine dependence: Secondary | ICD-10-CM | POA: Diagnosis not present

## 2020-12-24 DIAGNOSIS — Z7901 Long term (current) use of anticoagulants: Secondary | ICD-10-CM | POA: Diagnosis not present

## 2020-12-24 DIAGNOSIS — E785 Hyperlipidemia, unspecified: Secondary | ICD-10-CM | POA: Diagnosis not present

## 2020-12-24 DIAGNOSIS — Z79899 Other long term (current) drug therapy: Secondary | ICD-10-CM | POA: Diagnosis not present

## 2020-12-24 DIAGNOSIS — G4733 Obstructive sleep apnea (adult) (pediatric): Secondary | ICD-10-CM | POA: Diagnosis not present

## 2020-12-24 DIAGNOSIS — C9110 Chronic lymphocytic leukemia of B-cell type not having achieved remission: Secondary | ICD-10-CM | POA: Diagnosis not present

## 2020-12-24 DIAGNOSIS — D72820 Lymphocytosis (symptomatic): Secondary | ICD-10-CM | POA: Diagnosis not present

## 2020-12-24 DIAGNOSIS — R109 Unspecified abdominal pain: Secondary | ICD-10-CM | POA: Diagnosis not present

## 2020-12-24 DIAGNOSIS — J449 Chronic obstructive pulmonary disease, unspecified: Secondary | ICD-10-CM | POA: Diagnosis not present

## 2020-12-24 DIAGNOSIS — G4730 Sleep apnea, unspecified: Secondary | ICD-10-CM | POA: Diagnosis not present

## 2020-12-24 DIAGNOSIS — I7300 Raynaud's syndrome without gangrene: Secondary | ICD-10-CM | POA: Diagnosis not present

## 2020-12-24 DIAGNOSIS — Z85118 Personal history of other malignant neoplasm of bronchus and lung: Secondary | ICD-10-CM | POA: Diagnosis not present

## 2020-12-24 DIAGNOSIS — I480 Paroxysmal atrial fibrillation: Secondary | ICD-10-CM | POA: Diagnosis not present

## 2020-12-24 DIAGNOSIS — Z902 Acquired absence of lung [part of]: Secondary | ICD-10-CM | POA: Diagnosis not present

## 2020-12-24 DIAGNOSIS — Z7902 Long term (current) use of antithrombotics/antiplatelets: Secondary | ICD-10-CM | POA: Diagnosis not present

## 2020-12-24 DIAGNOSIS — I252 Old myocardial infarction: Secondary | ICD-10-CM | POA: Diagnosis not present

## 2020-12-27 ENCOUNTER — Telehealth: Payer: Self-pay | Admitting: *Deleted

## 2020-12-27 NOTE — Telephone Encounter (Signed)
   [facility] Pre-operative Risk Assessment    HEARTCARE STAFF: - Please ensure there is not already an duplicate clearance open for this procedure. - Under Visit Info/Reason for Call, type in Other and utilize the format Clearance MM/DD/YY or Clearance TBD. Do not use dashes or single digits. - If request is for dental extraction, please clarify the # of teeth to be extracted.  Request for surgical clearance:  1. What type of surgery is being performed? COLONOSCOPY    2. When is this surgery scheduled? 02/02/21   3. What type of clearance is required (medical clearance vs. Pharmacy clearance to hold med vs. Both)?   4. Are there any medications that need to be held prior to surgery and how long?COUMADIN    5. Practice name and name of physician performing surgery? [facility] DR Tula Nakayama MD    6. What is the office phone number? [phone]    7.   What is the office fax number? [phone]   8.   Anesthesia type (None, local, MAC, general) ? MONITORED ANESTHESIA

## 2020-12-28 ENCOUNTER — Ambulatory Visit: Payer: Medicare Other | Admitting: Podiatry

## 2020-12-28 NOTE — Telephone Encounter (Signed)
Patient with diagnosis of afib on warfarin for anticoagulation.    Procedure: COLONOSCOPY Date of procedure: 02/02/21  CHA2DS2-VASc Score = 6  This indicates a 9.7% annual risk of stroke. The patient's score is based upon: CHF History: Yes HTN History: Yes Diabetes History: No Stroke History: No Vascular Disease History: Yes Age Score: 2 Gender Score: 1     Per office protocol, patient can hold warfarin for 5 days prior to procedure.   Patient will NOT need bridging with Lovenox (enoxaparin) around procedure.

## 2020-12-28 NOTE — Telephone Encounter (Signed)
Will route to PharmD for rec's re: holding anticoagulation. Richardson Dopp, PA-C    12/28/2020 4:05 PM

## 2020-12-30 ENCOUNTER — Other Ambulatory Visit: Payer: Self-pay

## 2020-12-30 ENCOUNTER — Ambulatory Visit (INDEPENDENT_AMBULATORY_CARE_PROVIDER_SITE_OTHER): Payer: Medicare Other | Admitting: Podiatry

## 2020-12-30 ENCOUNTER — Encounter: Payer: Self-pay | Admitting: Podiatry

## 2020-12-30 DIAGNOSIS — B351 Tinea unguium: Secondary | ICD-10-CM | POA: Diagnosis not present

## 2020-12-30 NOTE — Telephone Encounter (Signed)
   Primary Cardiologist: Shelva Majestic, MD  Chart reviewed as part of pre-operative protocol coverage. Given past medical history and time since last visit, based on ACC/AHA guidelines, [name] would be at acceptable risk for the planned procedure without further cardiovascular testing.   Per pharmacy:  Patient with diagnosis of afib on warfarin for anticoagulation.    Procedure: COLONOSCOPY Date of procedure: 02/02/21  CHA2DS2-VASc Score = 6  This indicates a 9.7% annual risk of stroke. The patient's score is based upon: CHF History: Yes HTN History: Yes Diabetes History: No Stroke History: No Vascular Disease History: Yes Age Score: 2 Gender Score: 1   Per office protocol, patient can hold warfarin for 5 days prior to procedure. Patient will NOT need bridging with Lovenox (enoxaparin) around procedure.  I will route this recommendation to the requesting party via Epic fax function and remove from pre-op pool.  Please call with questions.  Kathyrn Drown, NP 12/30/2020, 8:47 AM

## 2020-12-31 ENCOUNTER — Encounter: Payer: Self-pay | Admitting: Podiatry
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
## 2021-01-04 ENCOUNTER — Other Ambulatory Visit: Payer: Self-pay

## 2021-01-04 ENCOUNTER — Telehealth: Payer: Self-pay | Admitting: Cardiovascular Disease

## 2021-01-04 ENCOUNTER — Other Ambulatory Visit: Payer: Self-pay | Admitting: Internal Medicine

## 2021-01-04 ENCOUNTER — Encounter: Payer: Self-pay | Admitting: Cardiovascular Disease

## 2021-01-04 ENCOUNTER — Ambulatory Visit (INDEPENDENT_AMBULATORY_CARE_PROVIDER_SITE_OTHER): Payer: Medicare Other | Admitting: Cardiovascular Disease

## 2021-01-04 VITALS — BP 134/78 | HR 61 | Ht 63.0 in | Wt 159.0 lb

## 2021-01-04 DIAGNOSIS — I739 Peripheral vascular disease, unspecified: Secondary | ICD-10-CM

## 2021-01-04 DIAGNOSIS — I480 Paroxysmal atrial fibrillation: Secondary | ICD-10-CM | POA: Diagnosis not present

## 2021-01-04 DIAGNOSIS — I252 Old myocardial infarction: Secondary | ICD-10-CM | POA: Diagnosis not present

## 2021-01-04 DIAGNOSIS — I2510 Atherosclerotic heart disease of native coronary artery without angina pectoris: Secondary | ICD-10-CM

## 2021-01-04 MED ORDER — METOPROLOL SUCCINATE ER 25 MG PO TB24: ORAL_TABLET | ORAL | 1 refills | Status: DC

## 2021-01-04 MED ORDER — AMLODIPINE BESYLATE 2.5 MG PO TABS: 2.5000 mg | ORAL_TABLET | Freq: Every day | ORAL | 1 refills | Status: DC

## 2021-01-04 NOTE — Patient Instructions (Signed)
Medication Instructions:  Your physician recommends that you continue on your current medications as directed. Please refer to the Current Medication list given to you today.  *If you need a refill on your cardiac medications before your next appointment, please call your pharmacy*  Testing/Procedures: Your physician has requested that you have an abdominal aorta duplex. During this test, an ultrasound is used to evaluate the aorta. Allow 30 minutes for this exam. Do not eat after midnight the day before and avoid carbonated beverages  Your physician has requested that you have a lower extremity arterial duplex. This test is an ultrasound of the arteries in the legs. It looks at arterial blood flow in the legs. Allow one hour for Lower Arterial scans. There are no restrictions or special instructions  -These procedures are done at [facility]. 2nd Floor. To be done in July of 2022.  Follow-Up: At [facility], you and your health needs are our priority.  As part of our continuing mission to provide you with exceptional heart care, we have created designated Provider Care Teams.  These Care Teams include your primary Cardiologist (physician) and Advanced Practice Providers (APPs -  Physician Assistants and Nurse Practitioners) who all work together to provide you with the care you need, when you need it.  We recommend signing up for the patient portal called "MyChart".  Sign up information is provided on this After Visit Summary.  MyChart is used to connect with patients for Virtual Visits (Telemedicine).  Patients are able to view lab/test results, encounter notes, upcoming appointments, etc.  Non-urgent messages can be sent to your provider as well.   To learn more about what you can do with MyChart, go to [email or web address].    Your next appointment:   12 month(s)  The format for your next appointment:   In Person  Provider:   Quay Burow, MD

## 2021-01-04 NOTE — Telephone Encounter (Signed)
Pt c/o medication issue:  1. Name of Medication:    amLODipine (NORVASC) 2.5 MG tablet(Expired) metoprolol succinate (TOPROL-XL) 25 MG 24 hr tablet   2. How are you currently taking this medication (dosage and times per day)?  As written  3. Are you having a reaction (difficulty breathing--STAT)?  No    4. What is your medication issue? Prescriptions needs to be sent to new pharmacy at [place], [place]

## 2021-01-04 NOTE — Telephone Encounter (Signed)
Prescriptions sent to pharmacy

## 2021-01-04 NOTE — Progress Notes (Signed)
01/04/2021 [name]   01-29-1946  [id number]  Primary Physician [name], MD Primary Cardiologist: Lorretta Harp MD Garret Reddish, [place], [place]  HPI:  [name] is a 75 y.o.  divorced, mother of 2, grandmother to grandchildren referred by Dr. Claiborne Billings for peripheral vascular evaluation because of lifestyle limiting claudication.I last saw her in the office  08/24/2020. She has a history of coronary artery disease status post bypass grafting 3 2008. She has COPD with 40 pack years tobacco abuse having quit 10 years ago as well as history of hypertension and hyperlipidemia. She's also had squamous cell carcinoma surgically removed with a VATS by Dr. Caffie Pinto 07/02/17 with some perioperative A. fib. She denies respiratory shortness of breath. She does complain of somewhat limiting claudication. She did have remote Dopplers in 2011 revealed normal ABIs however most recent Dopplers performed 08/13/17 revealed a right ABI of 0.84 and a leftABI of .72.She did have mild to moderate iliac disease.  She is limited somewhat by shortness of breath from her COPD but she also has progressive lifestyle limiting claudication.Dopplers performed 04/22/2020 revealed a right ABI of 1.01 a left of 0.83 with high-frequency signals in both iliac arteries.Based on this, we decided to proceed with angiography potential endovascular therapy for lifestyle limiting claudication.  I performed angiography on 05/13/2020 revealing high-grade calcified ostial bilateral iliac stenoses. I performed orbital atherectomy, PTA and covered stenting using "kissing stent technique of both iliac arteries with excellent angiographic result. Unfortunately, that night she developed back pain and hypotension. CT scan showed a retroperitoneal bleed. She was transferred to the ICU where she was observed for the next several days. Hemoglobin stabilized. She ambulated prior to discharge on the fourth. She says that her  legs feel "heavy" but there is no edema. She does have a significant ecchymoses in both groins, suprapubic area and flanks. Her hemoglobin was 10.9 yesterday.   Her Dopplers performed on 05/29/2020 have normalized with normal ABIs and velocities.   She restarted her Coumadin since I saw her last.  Her hemoglobin has come up to 14.  Her claudication has resolved although she has been complaining of right lower quadrant discomfort which does not sound vascular.  She is complaining of excessive ecchymosis probably related to the combination of Coumadin and Plavix.   Since I saw her 3 months ago she has complained of some mild right calf claudication.  Her right TBI is mildly reduced as well.  She denies chest pain or shortness of breath.   Current Meds  Medication Sig  . acetaminophen (TYLENOL) 500 MG tablet Take 1,000 mg by mouth every 8 (eight) hours as needed (pain.).   [name] amLODipine (NORVASC) 2.5 MG tablet Take 1 tablet (2.5 mg total) by mouth daily.  . Calcium Carb-Cholecalciferol (CALTRATE 600+D3 PO) Take 1 tablet by mouth daily at 12 noon.  . Carboxymethylcellulose Sodium (THERATEARS) 0.25 % SOLN   . Cholecalciferol (CVS D3) 25 MCG (1000 UT) capsule   . docusate sodium (COLACE) 100 MG capsule Take 200 mg by mouth every evening.   . ezetimibe (ZETIA) 10 MG tablet Take 1 tablet (10 mg total) by mouth daily.  . hydrocortisone cream 0.5 % hydrocortisone 0.5 % topical cream  APPLY A THIN LAYER TO THE AFFECTED AREA(S) BY TOPICAL ROUTE 2 TIMES PER DAY  . hydrocortisone valerate cream (WESTCORT) 0.2 % Apply 1 application topically 3 (three) times a week. On random days - for eczema in ear  . isosorbide mononitrate (  IMDUR) 30 MG 24 hr tablet TAKE 1 TABLET(30 MG) BY MOUTH TWICE DAILY  . lisinopril (ZESTRIL) 20 MG tablet Take 20 mg by mouth in the morning and at bedtime.  . metoprolol succinate (TOPROL-XL) 25 MG 24 hr tablet TAKE 1 TABLET(25 MG) BY MOUTH DAILY  . metoprolol tartrate (LOPRESSOR) 25  MG tablet Take 25 mg by mouth daily as needed (afib).  . nitroGLYCERIN (NITROSTAT) 0.4 MG SL tablet Place 1 tablet (0.4 mg total) under the tongue every 5 (five) minutes x 3 doses as needed for chest pain.  . polyethylene glycol (MIRALAX / GLYCOLAX) 17 g packet   . pyridOXINE (VITAMIN B-6) 100 MG tablet Take 100 mg by mouth daily at 12 noon.  . rosuvastatin (CRESTOR) 10 MG tablet TAKE 1 TABLET(10 MG) BY MOUTH DAILY  . umeclidinium-vilanterol (ANORO ELLIPTA) 62.5-25 MCG/INH AEPB Inhale 1 puff into the lungs daily.  . valACYclovir (VALTREX) 1000 MG tablet Take 2,000 mg by mouth daily as needed (fever blisters/cold sores.).   [name] vitamin B-12 (CYANOCOBALAMIN) 500 MCG tablet Take 1,000 mcg by mouth daily at 12 noon.  . warfarin (COUMADIN) 2.5 MG tablet TAKE 1/2 TO 1 TABLET BY MOUTH DAILY AS DIRECTED     Allergies  Allergen Reactions  . Amiodarone Other (See Comments)    angioedema  . Clindamycin/Lincomycin Swelling    TROUBLE SWALLOWING......SEVERE CHEST PAIN  . Doxycycline Other (See Comments)    blistering  . Sulfa Antibiotics Photosensitivity, Rash and Other (See Comments)    Red, burning rash & paralysis Burning Rash  . Lipitor [Atorvastatin] Other (See Comments)    MYALGIAS LEG PAIN  . Phenergan [Promethazine Hcl] Other (See Comments)    Nervous Leg / Restless Leg Syndrome  . Reclast [Zoledronic Acid] Other (See Comments)    Flu symptoms - made pt very sick, and had inflammation  In her eye  . Cephalexin   . Ketorolac Nausea Only    headache  . Diltiazem Other (See Comments)    Weakness on oral Dilt  . Latex Rash    Social History   Socioeconomic History  . Marital status: Divorced    Spouse name: Not on file  . Number of children: 2  . Years of education: Not on file  . Highest education level: Not on file  Occupational History  . Occupation: Retired  Tobacco Use  . Smoking status: Former Smoker    Packs/day: 1.00    Years: 40.00    Pack years: 40.00    Types:  Cigarettes    Quit date: 09/15/2007    Years since quitting: 13.3  . Smokeless tobacco: Never Used  Vaping Use  . Vaping Use: Never used  Substance and Sexual Activity  . Alcohol use: Yes    Alcohol/week: 6.0 standard drinks    Types: 6 Glasses of wine per week  . Drug use: No  . Sexual activity: Never  Other Topics Concern  . Not on file  Social History Narrative   She lives in [place], [place]. Her daughter helps with her care.    Social Determinants of Health   Financial Resource Strain: Not on file  Food Insecurity: Not on file  Transportation Needs: Not on file  Physical Activity: Not on file  Stress: Not on file  Social Connections: Not on file  Intimate Partner Violence: Not on file     Review of Systems: General: negative for chills, fever, night sweats or weight changes.  Cardiovascular: negative for chest pain, dyspnea on exertion,  edema, orthopnea, palpitations, paroxysmal nocturnal dyspnea or shortness of breath Dermatological: negative for rash Respiratory: negative for cough or wheezing Urologic: negative for hematuria Abdominal: negative for nausea, vomiting, diarrhea, bright red blood per rectum, melena, or hematemesis Neurologic: negative for visual changes, syncope, or dizziness All other systems reviewed and are otherwise negative except as noted above.    Blood pressure 134/78, pulse 61, height 5\' 3"  (1.6 m), weight 159 lb (72.1 kg).  General appearance: alert and no distress Neck: no adenopathy, no carotid bruit, no JVD, supple, symmetrical, trachea midline and thyroid not enlarged, symmetric, no tenderness/mass/nodules Lungs: clear to auscultation bilaterally Heart: regular rate and rhythm, S1, S2 normal, no murmur, click, rub or gallop Extremities: extremities normal, atraumatic, no cyanosis or edema Pulses: 2+ and symmetric Skin: Skin color, texture, turgor normal. No rashes or lesions Neurologic: Alert and oriented X 3, normal strength and tone.  Normal symmetric reflexes. Normal coordination and gait  EKG sinus rhythm at 61 with incomplete right bundle branch block.  I personally reviewed this EKG.  ASSESSMENT AND PLAN:   PAD (peripheral artery disease) ([place]) [name] returns a for follow-up of her PAD.  She had orbital atherectomy, PTA and covered stenting of both iliac arteries using "kissing stent technique" 11/17/7260 (complicated by retroperitoneal bleed which ultimately stabilized.  Her claudication improved.  Her most recent Doppler studies performed 11/16/2020 revealed normal ABIs bilaterally with normal iliac velocities.      Lorretta Harp MD FACP,FACC,FAHA, [facility] 01/04/2021 4:29 PM

## 2021-01-04 NOTE — Assessment & Plan Note (Signed)
[name] returns a for follow-up of her PAD.  She had orbital atherectomy, PTA and covered stenting of both iliac arteries using "kissing stent technique" 0/05/2256 (complicated by retroperitoneal bleed which ultimately stabilized.  Her claudication improved.  Her most recent Doppler studies performed 11/16/2020 revealed normal ABIs bilaterally with normal iliac velocities.

## 2021-01-10 ENCOUNTER — Other Ambulatory Visit: Payer: Medicare Other

## 2021-01-10 ENCOUNTER — Encounter: Payer: Self-pay | Admitting: Cardiovascular Disease

## 2021-01-10 ENCOUNTER — Ambulatory Visit (INDEPENDENT_AMBULATORY_CARE_PROVIDER_SITE_OTHER): Payer: Medicare Other | Admitting: Cardiovascular Disease

## 2021-01-10 ENCOUNTER — Other Ambulatory Visit: Payer: Self-pay

## 2021-01-10 ENCOUNTER — Ambulatory Visit (INDEPENDENT_AMBULATORY_CARE_PROVIDER_SITE_OTHER): Payer: Medicare Other

## 2021-01-10 DIAGNOSIS — I480 Paroxysmal atrial fibrillation: Secondary | ICD-10-CM

## 2021-01-10 DIAGNOSIS — I739 Peripheral vascular disease, unspecified: Secondary | ICD-10-CM | POA: Diagnosis not present

## 2021-01-10 DIAGNOSIS — I2510 Atherosclerotic heart disease of native coronary artery without angina pectoris: Secondary | ICD-10-CM | POA: Diagnosis not present

## 2021-01-10 DIAGNOSIS — C9110 Chronic lymphocytic leukemia of B-cell type not having achieved remission: Secondary | ICD-10-CM | POA: Diagnosis not present

## 2021-01-10 DIAGNOSIS — Z7901 Long term (current) use of anticoagulants: Secondary | ICD-10-CM

## 2021-01-10 DIAGNOSIS — I252 Old myocardial infarction: Secondary | ICD-10-CM

## 2021-01-10 DIAGNOSIS — Z5181 Encounter for therapeutic drug level monitoring: Secondary | ICD-10-CM | POA: Diagnosis not present

## 2021-01-10 DIAGNOSIS — I10 Essential (primary) hypertension: Secondary | ICD-10-CM

## 2021-01-10 DIAGNOSIS — E785 Hyperlipidemia, unspecified: Secondary | ICD-10-CM | POA: Diagnosis not present

## 2021-01-10 LAB — POCT INR: INR: 2.6 (ref 2.0–3.0)

## 2021-01-10 MED ORDER — AMLODIPINE BESYLATE 5 MG PO TABS: 5.0000 mg | ORAL_TABLET | Freq: Every day | ORAL | 1 refills | Status: DC

## 2021-01-10 MED ORDER — ROSUVASTATIN CALCIUM 20 MG PO TABS: 20.0000 mg | ORAL_TABLET | Freq: Every day | ORAL | 1 refills | Status: DC

## 2021-01-10 NOTE — Progress Notes (Signed)
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 disease with hx of myocardial infarct and hx of CABG   2. Essential hypertension   3. Hyperlipidemia LDL goal <70   4. Paroxysmal atrial fibrillation (HCC)   5. Long term current use of anticoagulant therapy   6. CLL (chronic lymphocytic leukemia) ([place]): stage 0   7. Claudication in peripheral vascular disease [facility])     ASSESSMENT AND PLAN: Ms. Tallman is a 75 year-old white female who underwent CABG revascularization surgery in November 2008.  Prior to surgery she had smoked for over 30 years.  She has not required supplemental nitroglycerin use for her coronary obstructive disease.  A nuclear stress test in 2015 was normal.  A follow-up nuclear perfusion study in April 2018 was unchanged and continued to show normal perfusion and LV function.  She has a history of paroxysmal atrial fibrillation and had previously undergone cardioversion.   She is on warfarin for anticoagulation due to the cost of DOACs. She was  found to have a spiculated hypermetabolic lung nodule on CT which had progressed in size and underwent a VATS procedure for resection.  This was complicated by postoperative bleeding 2 days later, requiring surgical exploration.  She again developed postoperative atrial fibrillation and had been maintained on amiodarone , but this ultimately was discontinued due to bradycardia.  She developed progressive claudication symptoms and was found to have  high-grade calcified bilateral iliac stenoses and underwent orbital atherectomy, PTA, and covered stenting using kissing technique both iliac arteries.  She subsequently developed a retroperitoneal bleed later that night but ultimately hemoglobin stabilized.  She has been seen in follow-up by Dr. Alvester Chou.  Her claudication is much better following her procedure.  However she continues to experience some mild right calf claudication symptoms.  When I last saw her, she was noticing morning blood pressure elevation and low-dose amlodipine 2.5 mg was added at bedtime.  Her blood pressure today continues to be elevated on recheck by me and I have suggested that she titrate her amlodipine to 5 mg.  She will continue with her present dose of isosorbide 30 mg twice a day, lisinopril 20 mg, metoprolol succinate 25 mg daily and she has a prescription to take tartrate on a as needed basis.  She continues to be on Zetia and rosuvastatin 10 mg for hyperlipidemia.  LDL cholesterol in June 2021 was 94.  I have suggested she further titrate rosuvastatin to 20 mg.  She has experienced some vague left lateral chest discomfort.  Since it has been 4 years since her last nuclear assessment almost 14 years since her CABG revascularization I will schedule her for follow-up Lexiscan Myoview study.  I will also schedule her for 2D echo Doppler evaluation particularly with exertional shortness of breath.  I reviewed her most recent evaluation at [facility] concerning her CLL.  In 6 weeks she will monitor goal repeat fasting laboratory with a comprehensive metabolic panel, CBC, lipid studies and TSH.  I will see her in 2 months for reevaluation.  Troy Sine, MD, [name]  01/17/2021 3:21 PM

## 2021-01-10 NOTE — Patient Instructions (Addendum)
Medication Instructions:  Increase Crestor 20 mg daily  Increase Amlodipine 5 mg daily   *If you need a refill on your cardiac medications before your next appointment, please call your pharmacy*   Lab Work: CBC, LIPID, CMET in 6 weeks (come fasting, no lab appointment needed)  If you have labs (blood work) drawn today and your tests are completely normal, you will receive your results only by: [name] MyChart Message (if you have MyChart) OR . A paper copy in the mail If you have any lab test that is abnormal or we need to change your treatment, we will call you to review the results.  Testing:  Your physician has requested that you have a lexiscan myoview. A cardiac stress test is a cardiological test that measures the heart's ability to respond to external stress in a controlled clinical environment. The stress response is induced by intravenous pharmacological stimulation.   Echocardiogram - Your physician has requested that you have an echocardiogram. Echocardiography is a painless test that uses sound waves to create images of your heart. It provides your doctor with information about the size and shape of your heart and how well your heart's chambers and valves are working. This procedure takes approximately one hour. There are no restrictions for this procedure. This will be performed at our [facility] location - [address], [address].    Follow-Up: At [facility], you and your health needs are our priority.  As part of our continuing mission to provide you with exceptional heart care, we have created designated Provider Care Teams.  These Care Teams include your primary Cardiologist (physician) and Advanced Practice Providers (APPs -  Physician Assistants and Nurse Practitioners) who all work together to provide you with the care you need, when you need it.  We recommend signing up for the patient portal called "MyChart".  Sign up information is provided on this After Visit  Summary.  MyChart is used to connect with patients for Virtual Visits (Telemedicine).  Patients are able to view lab/test results, encounter notes, upcoming appointments, etc.  Non-urgent messages can be sent to your provider as well.   To learn more about what you can do with MyChart, go to [email or web address].    Your next appointment:   2 month(s) after testing   The format for your next appointment:   In Person  Provider:   Shelva Majestic, MD

## 2021-01-10 NOTE — Patient Instructions (Signed)
continue warfarin dose 2.5mg  daily except for 1.25mg  every Tuesday. Repeat INR in 4 weeks. Colonoscopy 3/23;  HOLD Coumadin 3/18, 3/19, 3/20, 3/21, 3/22;

## 2021-01-11 ENCOUNTER — Other Ambulatory Visit: Payer: Self-pay | Admitting: Cardiovascular Disease

## 2021-01-17 ENCOUNTER — Encounter: Payer: Self-pay | Admitting: Cardiovascular Disease

## 2021-01-18 DIAGNOSIS — I2510 Atherosclerotic heart disease of native coronary artery without angina pectoris: Secondary | ICD-10-CM | POA: Diagnosis not present

## 2021-01-18 DIAGNOSIS — R0600 Dyspnea, unspecified: Secondary | ICD-10-CM | POA: Diagnosis not present

## 2021-01-18 DIAGNOSIS — G4730 Sleep apnea, unspecified: Secondary | ICD-10-CM | POA: Diagnosis not present

## 2021-01-18 DIAGNOSIS — R5383 Other fatigue: Secondary | ICD-10-CM | POA: Diagnosis not present

## 2021-01-18 DIAGNOSIS — C9110 Chronic lymphocytic leukemia of B-cell type not having achieved remission: Secondary | ICD-10-CM | POA: Diagnosis not present

## 2021-01-18 DIAGNOSIS — K5730 Diverticulosis of large intestine without perforation or abscess without bleeding: Secondary | ICD-10-CM | POA: Diagnosis not present

## 2021-01-18 DIAGNOSIS — R1012 Left upper quadrant pain: Secondary | ICD-10-CM | POA: Diagnosis not present

## 2021-01-18 DIAGNOSIS — Z9049 Acquired absence of other specified parts of digestive tract: Secondary | ICD-10-CM | POA: Diagnosis not present

## 2021-01-19 ENCOUNTER — Ambulatory Visit
Admission: RE | Admit: 2021-01-19 | Discharge: 2021-01-19 | Disposition: A | Discharge status: Home / Self Care | Payer: Medicare Other | Source: Ambulatory Visit | Attending: Surgery | Admitting: Surgery

## 2021-01-19 ENCOUNTER — Other Ambulatory Visit: Payer: Self-pay

## 2021-01-19 ENCOUNTER — Encounter: Payer: Self-pay | Admitting: Surgery

## 2021-01-19 ENCOUNTER — Ambulatory Visit (INDEPENDENT_AMBULATORY_CARE_PROVIDER_SITE_OTHER): Payer: Medicare Other | Admitting: Surgery

## 2021-01-19 VITALS — BP 128/71 | HR 61 | Resp 20 | Ht 63.0 in | Wt 158.0 lb

## 2021-01-19 DIAGNOSIS — D2271 Melanocytic nevi of right lower limb, including hip: Secondary | ICD-10-CM | POA: Diagnosis not present

## 2021-01-19 DIAGNOSIS — L538 Other specified erythematous conditions: Secondary | ICD-10-CM | POA: Diagnosis not present

## 2021-01-19 DIAGNOSIS — Z85118 Personal history of other malignant neoplasm of bronchus and lung: Secondary | ICD-10-CM

## 2021-01-19 DIAGNOSIS — I2510 Atherosclerotic heart disease of native coronary artery without angina pectoris: Secondary | ICD-10-CM | POA: Diagnosis not present

## 2021-01-19 DIAGNOSIS — I252 Old myocardial infarction: Secondary | ICD-10-CM | POA: Diagnosis not present

## 2021-01-19 DIAGNOSIS — Z08 Encounter for follow-up examination after completed treatment for malignant neoplasm: Secondary | ICD-10-CM

## 2021-01-19 DIAGNOSIS — Z902 Acquired absence of lung [part of]: Secondary | ICD-10-CM | POA: Diagnosis not present

## 2021-01-19 DIAGNOSIS — L570 Actinic keratosis: Secondary | ICD-10-CM | POA: Diagnosis not present

## 2021-01-19 DIAGNOSIS — B001 Herpesviral vesicular dermatitis: Secondary | ICD-10-CM | POA: Diagnosis not present

## 2021-01-19 DIAGNOSIS — D2262 Melanocytic nevi of left upper limb, including shoulder: Secondary | ICD-10-CM | POA: Diagnosis not present

## 2021-01-19 DIAGNOSIS — X32XXXA Exposure to sunlight, initial encounter: Secondary | ICD-10-CM | POA: Diagnosis not present

## 2021-01-19 DIAGNOSIS — D2261 Melanocytic nevi of right upper limb, including shoulder: Secondary | ICD-10-CM | POA: Diagnosis not present

## 2021-01-19 DIAGNOSIS — D0461 Carcinoma in situ of skin of right upper limb, including shoulder: Secondary | ICD-10-CM | POA: Diagnosis not present

## 2021-01-19 DIAGNOSIS — Z85828 Personal history of other malignant neoplasm of skin: Secondary | ICD-10-CM | POA: Diagnosis not present

## 2021-01-19 DIAGNOSIS — R918 Other nonspecific abnormal finding of lung field: Secondary | ICD-10-CM | POA: Diagnosis not present

## 2021-01-19 DIAGNOSIS — D225 Melanocytic nevi of trunk: Secondary | ICD-10-CM | POA: Diagnosis not present

## 2021-01-19 DIAGNOSIS — D485 Neoplasm of uncertain behavior of skin: Secondary | ICD-10-CM | POA: Diagnosis not present

## 2021-01-22 NOTE — Progress Notes (Signed)
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 She had lymphovascular invasion but negative resection margins.ShehasCLL but it is early stage and does not require any treatment. When I saw her in February 2021 a CT of the chest showed some enlargement of the left axillary lymph nodes with the most prominent one measuring 1.3 cm.  We decided to do a PET scan to evaluate that further and it showed mild metabolic activity associated with the left axillary lymphadenopathy but less than the background blood pool activity.  The previously seen 12 mm lymph node had decreased in size to 10 mm.  It was felt that most likely this lymphadenopathy was related to her recent Moderna COVID-19 vaccine given in the left arm.  She continues to do fairly well overall.  She has chronic shortness of breath related to COPD that is fairly stable.  Current Outpatient Medications  Medication Sig Dispense Refill  . acetaminophen (TYLENOL) 500 MG tablet Take 1,000 mg by mouth every 8 (eight) hours as needed (pain.).     [name] amLODipine (NORVASC) 2.5 MG tablet TAKE 1 TABLET BY MOUTH EVERY DAY 30 tablet 6  . Calcium Carb-Cholecalciferol (CALTRATE 600+D3 PO) Take 1 tablet by mouth daily at 12 noon.    . Carboxymethylcellulose Sodium (THERATEARS) 0.25 % SOLN     . Cholecalciferol ([employer] D3) 25 MCG (1000 UT) capsule     . docusate sodium (COLACE) 100 MG capsule Take 200 mg by mouth every evening.     . hydrocortisone valerate cream (WESTCORT) 0.2 % Apply 1 application topically 3 (three) times a week. On random days - for eczema in ear    . isosorbide mononitrate (IMDUR) 30 MG 24 hr tablet TAKE 1 TABLET(30 MG) BY MOUTH TWICE DAILY 180 tablet 3  . lisinopril (ZESTRIL) 20 MG tablet Take 20 mg by mouth in the morning and at bedtime.    . metoprolol succinate (TOPROL-XL) 25 MG 24 hr tablet TAKE 1  TABLET(25 MG) BY MOUTH DAILY 90 tablet 1  . metoprolol tartrate (LOPRESSOR) 25 MG tablet Take 25 mg by mouth daily as needed (afib).    . nitroGLYCERIN (NITROSTAT) 0.4 MG SL tablet Place 1 tablet (0.4 mg total) under the tongue every 5 (five) minutes x 3 doses as needed for chest pain. 25 tablet 1  . polyethylene glycol (MIRALAX / GLYCOLAX) 17 g packet     . pyridOXINE (VITAMIN B-6) 100 MG tablet Take 100 mg by mouth daily at 12 noon.    . rosuvastatin (CRESTOR) 20 MG tablet Take 1 tablet (20 mg total) by mouth daily. 90 tablet 1  . umeclidinium-vilanterol (ANORO ELLIPTA) 62.5-25 MCG/INH AEPB Inhale 1 puff into the lungs daily. 1 each 6  . valACYclovir (VALTREX) 1000 MG tablet Take 2,000 mg by mouth daily as needed (fever blisters/cold sores.).     [name] vitamin B-12 (CYANOCOBALAMIN) 500 MCG tablet Take 1,000 mcg by mouth daily at 12 noon.    . warfarin (COUMADIN) 2.5 MG tablet TAKE 1/2 TO 1 TABLET BY MOUTH DAILY AS DIRECTED 90 tablet 1  . amLODipine (NORVASC) 5 MG tablet Take 1 tablet (5 mg total) by mouth daily. 90 tablet 1  . ezetimibe (ZETIA) 10 MG tablet Take 1 tablet (10 mg total) by mouth daily. 90 tablet 3   No current facility-administered medications for this visit.     Physical  Exam: BP 128/71 (BP Location: Left Arm, Patient Position: Sitting)   Pulse 61   Resp 20   Ht 5\' 3"  (1.6 m)   Wt 158 lb (71.7 kg)   SpO2 96% Comment: RA  BMI 27.99 kg/m  She looks well. There is no cervical or supraclavicular adenopathy. Cardiac exam shows a regular rate and rhythm with normal heart sounds. Lung exam is clear.  Diagnostic Tests:  Narrative & Impression  CLINICAL DATA:  Lung cancer.  EXAM: CT CHEST WITHOUT CONTRAST  TECHNIQUE: Multidetector CT imaging of the chest was performed following the standard protocol without IV contrast.  COMPARISON:  07/21/2020.  FINDINGS: Cardiovascular: Atherosclerotic calcification of the aorta and aortic valve. Heart is at the upper limits  of normal in size. No pericardial effusion.  Mediastinum/Nodes: No pathologically enlarged mediastinal or axillary lymph nodes. Hilar regions are difficult to definitively evaluate without IV contrast. Esophagus is grossly unremarkable.  Lungs/Pleura: Left lower lobectomy. Centrilobular and paraseptal emphysema. Pulmonary nodules measure 3 mm or less in size, unchanged. Lungs are otherwise clear. Small left pleural effusion, unchanged. Airway is otherwise unremarkable.  Upper Abdomen: Subcentimeter low-attenuation lesion in the left hepatic lobe is too small to characterize but unchanged and likely a cyst. Visualized portions of the liver, gallbladder, adrenal glands, kidneys, spleen, pancreas, stomach and bowel are otherwise unremarkable with the exception of a small hiatal hernia.  Musculoskeletal: Scattered compression deformities are unchanged. Thoracotomy changes on the left.  IMPRESSION: 1. No evidence of recurrent or metastatic disease. 2. Chronic small left pleural effusion, stable. 3.  Aortic atherosclerosis (ICD10-I70.0). 4.  Emphysema (ICD10-J43.9).   Electronically Signed   By: Lorin Picket M.D.   On: 01/19/2021 13:18      Impression:  She is doing well 3-1/2 years following her surgery.  CT scan of the chest shows no evidence of recurrent or metastatic lung cancer.  She has a chronic small left pleural effusion that is unchanged.  The other tiny pulmonary nodules are unchanged.  I reviewed the CT images with her and answered her questions.  I recommend that she have a follow-up CT in 6 months.  Plan:  She will return to see me in 6 months with a CT scan of the chest.  I spent 20 minutes performing this established patient evaluation and > 50% of this time was spent face to face counseling and coordinating the surveillance of her previously resected lung cancer.    Gaye Pollack, MD [facility] [phone]

## 2021-01-24 ENCOUNTER — Telehealth: Payer: Self-pay | Admitting: Cardiovascular Disease

## 2021-01-24 ENCOUNTER — Telehealth: Payer: Self-pay

## 2021-01-24 NOTE — Telephone Encounter (Signed)
Faxed in [employer] form to the service center

## 2021-01-24 NOTE — Telephone Encounter (Signed)
She was started on a very low dose of amlodipine. We can try using amlodipine 5mg  daily (max daily dose 10mg  daily) and we cna check her BP in office while on coumadin clinic follow up.

## 2021-01-24 NOTE — Telephone Encounter (Signed)
[name] is calling requesting to speak with Almyra Free in regards to their previous conversation. Please advise.

## 2021-01-24 NOTE — Telephone Encounter (Signed)
Called in and left msg on our machine while we were at lunch and she stated that she feels bad while taking the amlodipine and would like to speak with a pharmd. She also has a colonoscopy scheduled and will be off coumadin for 5 days and is worried bp may interfere with it as well. Will route to pharmd pool

## 2021-01-24 NOTE — Telephone Encounter (Signed)
Pt c/o medication issue:  1. Name of Medication: amLODipine (NORVASC) 2.5 MG tablet  2. How are you currently taking this medication (dosage and times per day)? As directed   3. Are you having a reaction (difficulty breathing--STAT)?   4. What is your medication issue? PT believes this medication is not working for her . She wanted to know what Dr. Claiborne Billings would recommend to help control her BP better, especially since she has to stop taking her coumadin 01/27/21  Pt c/o BP issue: STAT if pt c/o blurred vision, one-sided weakness or slurred speech  1. What are your last 5 BP readings?  01/24/21: 147/79 HR 54 01/23/21: 147/60 HR 54    156/65 HR 52    166/72 HR 54  01/22/21: 142/74 HR 54    139/73 HR 55 2. Are you having any other symptoms (ex. Dizziness, headache, blurred vision, passed out)? Occasional headache, occasional dizziness   3. What is your BP issue? Pt feels medication is not controlling her BP well. Pt wants to know what Dr. Claiborne Billings recommends

## 2021-01-24 NOTE — Telephone Encounter (Signed)
Patient called back stating that she cancelled her colonoscopy until after she has her stress test and echo, she states she just is not feeling right about her blood pressure issues and wants to hear from [facility]-  I advised I would send another message and try to have him review it tonight after his clinic if he could. Patient was thankful for the help received today.   She states she does not want to take amlodipine any longer and wants to switch.

## 2021-01-24 NOTE — Telephone Encounter (Signed)
Called patient, she denies chest pain, no changes in shortness of breath. Patient mentions being very tired- and having no energy.  Her blood pressures are below and would like to know if she can change, as she does not think that the Amlodipine is working- she states it is not doing enough and keeping it low.  Patient advised a message was sent to MD to advise, but he is in clinic today and may not respond quickly- I advised I would send a message over to our PharmD team and have them review.   Patient thankful for call back.

## 2021-01-24 NOTE — Telephone Encounter (Signed)
Patient states she is already on the 5 mg- Dr.Kelly increased it recently.  She states she does not want to increase the amlodipine anymore- and does NOT like the way it makes her feel.

## 2021-01-25 DIAGNOSIS — D0460 Carcinoma in situ of skin of unspecified upper limb, including shoulder: Secondary | ICD-10-CM | POA: Insufficient documentation

## 2021-01-25 MED ORDER — SPIRONOLACTONE 25 MG PO TABS: 12.5000 mg | ORAL_TABLET | Freq: Every day | ORAL | 1 refills | Status: DC

## 2021-01-25 NOTE — Telephone Encounter (Signed)
Will stop amlodipine and start low dose spironolactone daily  Plan to repeat BMET in 2 weeks, and adjust as needed.   Patient requesting to see DR Caryl Comes, I will send message to DR Klein's office to contact patient to schedule.

## 2021-01-26 ENCOUNTER — Ambulatory Visit (INDEPENDENT_AMBULATORY_CARE_PROVIDER_SITE_OTHER): Payer: Medicare Other | Admitting: Pulmonary Disease

## 2021-01-26 ENCOUNTER — Other Ambulatory Visit: Payer: Self-pay

## 2021-01-26 ENCOUNTER — Encounter: Payer: Self-pay | Admitting: Pulmonary Disease

## 2021-01-26 VITALS — BP 128/72 | HR 67 | Temp 97.3°F | Ht 63.0 in | Wt 159.5 lb

## 2021-01-26 DIAGNOSIS — Z902 Acquired absence of lung [part of]: Secondary | ICD-10-CM | POA: Diagnosis not present

## 2021-01-26 DIAGNOSIS — J432 Centrilobular emphysema: Secondary | ICD-10-CM | POA: Diagnosis not present

## 2021-01-26 DIAGNOSIS — J449 Chronic obstructive pulmonary disease, unspecified: Secondary | ICD-10-CM | POA: Diagnosis not present

## 2021-01-26 DIAGNOSIS — Z85118 Personal history of other malignant neoplasm of bronchus and lung: Secondary | ICD-10-CM | POA: Diagnosis not present

## 2021-01-26 MED ORDER — TRELEGY ELLIPTA 100-62.5-25 MCG/INH IN AEPB: 1.0000 | INHALATION_SPRAY | Freq: Every day | RESPIRATORY_TRACT | 3 refills | Status: DC

## 2021-01-26 MED ORDER — ALBUTEROL SULFATE HFA 108 (90 BASE) MCG/ACT IN AERS: 2.0000 | INHALATION_SPRAY | Freq: Four times a day (QID) | RESPIRATORY_TRACT | 6 refills | Status: DC | PRN

## 2021-01-26 MED ORDER — TRELEGY ELLIPTA 100-62.5-25 MCG/INH IN AEPB: 1.0000 | INHALATION_SPRAY | Freq: Every day | RESPIRATORY_TRACT | 0 refills | Status: DC
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
## 2021-01-26 NOTE — Patient Instructions (Addendum)
Thank you for visiting Dr. Valeta Harms at [facility]. Today we recommend the following:  Orders Placed This Encounter  Procedures  . Pulmonary Function Test   Meds ordered this encounter  Medications  . Fluticasone-Umeclidin-Vilant (TRELEGY ELLIPTA) 100-62.5-25 MCG/INH AEPB    Sig: Inhale 1 puff into the lungs daily.    Dispense:  90 each    Refill:  3  . albuterol (VENTOLIN HFA) 108 (90 Base) MCG/ACT inhaler    Sig: Inhale 2 puffs into the lungs every 6 (six) hours as needed for wheezing or shortness of breath.    Dispense:  8 g    Refill:  6   Return in about 3 weeks (around 02/16/2021) for with APP or Dr. Valeta Harms. to review PFT results and new inhaler regimen    Please do your part to reduce the spread of COVID-19.

## 2021-01-26 NOTE — Addendum Note (Signed)
Addended by: Caprice Beaver T on: 01/26/2021 04:34 PM   Modules accepted: Orders

## 2021-01-27 DIAGNOSIS — R1012 Left upper quadrant pain: Secondary | ICD-10-CM | POA: Diagnosis not present

## 2021-01-27 DIAGNOSIS — Z9049 Acquired absence of other specified parts of digestive tract: Secondary | ICD-10-CM | POA: Diagnosis not present

## 2021-01-27 DIAGNOSIS — K5730 Diverticulosis of large intestine without perforation or abscess without bleeding: Secondary | ICD-10-CM | POA: Diagnosis not present

## 2021-01-27 DIAGNOSIS — C8593 Non-Hodgkin lymphoma, unspecified, intra-abdominal lymph nodes: Secondary | ICD-10-CM | POA: Diagnosis not present

## 2021-01-27 DIAGNOSIS — C9110 Chronic lymphocytic leukemia of B-cell type not having achieved remission: Secondary | ICD-10-CM | POA: Diagnosis not present

## 2021-01-28 NOTE — Addendum Note (Signed)
Addended by: Shelva Majestic A on: 01/28/2021 12:26 PM   Modules accepted: Orders

## 2021-01-28 NOTE — Telephone Encounter (Signed)
Can see if patient can slightly increase her at her amlodipine from 5 mg up to 7.5 mg daily.  In the past she may not have tolerated a much higher dose of amlodipine

## 2021-01-28 NOTE — Telephone Encounter (Signed)
Patient refuses to increase amlodipine and states she no longer wants to take that.  Please advise,. Thank you!

## 2021-01-31 ENCOUNTER — Telehealth: Payer: Self-pay | Admitting: Cardiovascular Disease

## 2021-01-31 ENCOUNTER — Telehealth: Payer: Self-pay | Admitting: Pulmonary Disease

## 2021-01-31 NOTE — Telephone Encounter (Signed)
Pt c/o swelling: STAT is pt has developed SOB within 24 hours  1) How much weight have you gained and in what time span? 1-2 lbs in 4-5 days  2) If swelling, where is the swelling located? Ankles and legs   3) Are you currently taking a fluid pill? Yes, spironolactone (ALDACTONE) 25 MG tablet  4) Are you currently SOB? No   5) Do you have a log of your daily weights (if so, list)? No   6) Have you gained 3 pounds in a day or 5 pounds in a week? No   7) Have you traveled recently? No   Patient states her legs are also in a lot of pain.

## 2021-01-31 NOTE — Telephone Encounter (Signed)
I reviewed the CT scan from [facility].  There is nothing there that I am concerned about.  She has diffuse atherosclerosis and the CT scan showed that as well.  With regards to her spironolactone, please have her come in and see an APP sometime in the next week or 2 to evaluate.  JJB

## 2021-01-31 NOTE — Telephone Encounter (Signed)
Ok thanks Garner Nash, DO [facility] 01/31/2021 11:43 AM

## 2021-01-31 NOTE — Telephone Encounter (Signed)
Spoke with pt who state despite Pharm D adding spironolactone to medication regimen her blood pressure continues to remain elevated. She report BP today is 160/77. Pt also stated her legs and feet feel really tight and heavy but don't show any signs of swelling. Pt state last night she also had awful cramps in both legs. Pt stat she does not believe a diuretic is the best option for her and requesting an additional blood pressure medication.   Pt also requesting Dr. Gwenlyn Found review recent abdominal CT scan from [facility] under care everywhere. She stat the report noted the following;   There are extensive atherosclerotic calcifications of the abdominal  aorta and multiple branch vessels including the superior mesenteric, renal, and iliac arteries.

## 2021-01-31 NOTE — Telephone Encounter (Signed)
Called and spoke with patient. She stated that at the 01/26/21 visit with BI, he recommended that she start taking Trelegy 144mcg due to her increased SOB. She has decided to remain on Anoro until his PFT on 02/22/21 with TP. Stated that she read about some of the side effects and with her having high blood pressure, she feels this is best for her. She also stated that she has not had any problems with SOB since her OV.   I advised her that I would update her chart and send a message to [facility] as a FYI.   Nothing further needed at time of call.

## 2021-02-01 NOTE — Telephone Encounter (Signed)
Follow Up:     Pt is returning Alisha's call from this morning

## 2021-02-01 NOTE — Telephone Encounter (Signed)
Pt updated with MD's recommendations and voiced she will call back later today to schedule appointment.

## 2021-02-01 NOTE — Telephone Encounter (Signed)
Appointment scheduled for 3/23 with Dr. Gwenlyn Found to discuss concerns.

## 2021-02-02 ENCOUNTER — Ambulatory Visit: Admission: RE | Admit: 2021-02-02 | Payer: Medicare Other | Source: Home / Self Care | Admitting: Internal Medicine

## 2021-02-02 ENCOUNTER — Telehealth (HOSPITAL_COMMUNITY): Payer: Self-pay | Admitting: *Deleted

## 2021-02-02 ENCOUNTER — Encounter: Admission: RE | Payer: Self-pay | Source: Home / Self Care

## 2021-02-02 ENCOUNTER — Telehealth (HOSPITAL_COMMUNITY): Payer: Self-pay | Admitting: Radiology

## 2021-02-02 ENCOUNTER — Encounter: Payer: Self-pay | Admitting: Cardiovascular Disease

## 2021-02-02 ENCOUNTER — Ambulatory Visit (INDEPENDENT_AMBULATORY_CARE_PROVIDER_SITE_OTHER): Payer: Medicare Other | Admitting: Cardiovascular Disease

## 2021-02-02 ENCOUNTER — Other Ambulatory Visit: Payer: Self-pay

## 2021-02-02 VITALS — BP 138/65 | HR 63 | Ht 63.0 in | Wt 157.8 lb

## 2021-02-02 DIAGNOSIS — I739 Peripheral vascular disease, unspecified: Secondary | ICD-10-CM

## 2021-02-02 DIAGNOSIS — I2510 Atherosclerotic heart disease of native coronary artery without angina pectoris: Secondary | ICD-10-CM | POA: Diagnosis not present

## 2021-02-02 DIAGNOSIS — I252 Old myocardial infarction: Secondary | ICD-10-CM | POA: Diagnosis not present

## 2021-02-02 SURGERY — COLONOSCOPY
Anesthesia: General

## 2021-02-02 NOTE — Telephone Encounter (Signed)
Patient given detailed instructions per Myocardial Perfusion Study Information Sheet for the test on 02/04/2021 at 10:45. Patient notified to arrive 15 minutes early and that it is imperative to arrive on time for appointment to keep from having the test rescheduled.  If you need to cancel or reschedule your appointment, please call the office within 24 hours of your appointment. . Patient verbalized understanding.EHK

## 2021-02-02 NOTE — Progress Notes (Signed)
[name] presents today because of concerns over recent abdominal CT scan performed at [facility] 01/27/2021 that showed Athar sclerotic calcification lesions of the aorta and branch vessels.  I did perform bilateral iliac artery orbital atherectomy, PTA and stenting using covered stents 05/13/2020 with marked improvement of claudication symptoms and her Doppler studies.  It is not surprising to me that she has aortoiliac atherosclerotic changes and calcification given her diffuse atherosclerosis although I do not think these are clinically relevant at this time.  I had a long discussion with the patient and her daughter-in-law regarding this and thoroughly explained the results of the CT scan.  Lorretta Harp, M.D., [place], [facility], [place]ver[place] [address] [place], [place]  [address]  [phone] 02/02/2021 12:37 PM

## 2021-02-02 NOTE — Telephone Encounter (Signed)
Called patient to go over instructions for stress test on 02/04/21.  Patient was at a doctor's appointment and will call back to get instructions.

## 2021-02-02 NOTE — Patient Instructions (Signed)

## 2021-02-03 NOTE — Telephone Encounter (Signed)
All concerns addressed at ov on 3/23 with Dr. Gwenlyn Found.

## 2021-02-04 ENCOUNTER — Ambulatory Visit (HOSPITAL_COMMUNITY): Payer: Medicare Other | Attending: Cardiology

## 2021-02-04 ENCOUNTER — Other Ambulatory Visit: Payer: Self-pay

## 2021-02-04 ENCOUNTER — Ambulatory Visit (HOSPITAL_BASED_OUTPATIENT_CLINIC_OR_DEPARTMENT_OTHER): Payer: Medicare Other

## 2021-02-04 DIAGNOSIS — I480 Paroxysmal atrial fibrillation: Secondary | ICD-10-CM

## 2021-02-04 LAB — MYOCARDIAL PERFUSION IMAGING
LV dias vol: 78 mL (ref 46–106)
LV sys vol: 30 mL
Peak HR: 85 {beats}/min
Rest HR: 53 {beats}/min
SDS: 0
SRS: 2
SSS: 2
TID: 1.08

## 2021-02-04 LAB — ECHOCARDIOGRAM COMPLETE
Area-P 1/2: 2.66 cm2
S' Lateral: 2.9 cm

## 2021-02-04 MED ORDER — TECHNETIUM TC 99M TETROFOSMIN IV KIT
30.9000 | PACK | Freq: Once | INTRAVENOUS | Status: AC | PRN
  Administered 2021-02-04: 30.9 via INTRAVENOUS
  Filled 2021-02-04: qty 31

## 2021-02-04 MED ORDER — TECHNETIUM TC 99M TETROFOSMIN IV KIT
10.2000 | PACK | Freq: Once | INTRAVENOUS | Status: AC | PRN
  Administered 2021-02-04: 10.2 via INTRAVENOUS
  Filled 2021-02-04: qty 11

## 2021-02-04 MED ORDER — REGADENOSON 0.4 MG/5ML IV SOLN
0.4000 mg | Freq: Once | INTRAVENOUS | Status: AC
  Administered 2021-02-04: 0.4 mg via INTRAVENOUS

## 2021-02-07 DIAGNOSIS — G4730 Sleep apnea, unspecified: Secondary | ICD-10-CM | POA: Diagnosis not present

## 2021-02-09 ENCOUNTER — Other Ambulatory Visit: Payer: Self-pay

## 2021-02-09 ENCOUNTER — Ambulatory Visit (INDEPENDENT_AMBULATORY_CARE_PROVIDER_SITE_OTHER): Payer: Medicare Other

## 2021-02-09 DIAGNOSIS — Z5181 Encounter for therapeutic drug level monitoring: Secondary | ICD-10-CM

## 2021-02-09 DIAGNOSIS — I480 Paroxysmal atrial fibrillation: Secondary | ICD-10-CM | POA: Diagnosis not present

## 2021-02-09 LAB — POCT INR: INR: 2.5 (ref 2.0–3.0)

## 2021-02-09 NOTE — Patient Instructions (Signed)
continue warfarin dose 2.5mg  daily except for 1.25mg  every Tuesday. Repeat INR in 5 weeks.

## 2021-02-14 ENCOUNTER — Telehealth: Payer: Self-pay | Admitting: Pulmonary Disease

## 2021-02-14 MED ORDER — UMECLIDINIUM-VILANTEROL 62.5-25 MCG/INH IN AEPB: 1.0000 | INHALATION_SPRAY | Freq: Every day | RESPIRATORY_TRACT | 3 refills | Status: DC

## 2021-02-14 NOTE — Telephone Encounter (Signed)
Called and spoke with patient to let her know I would send in refill of Anoro for her. She verified pharamcy. Nothing further needed at this time.

## 2021-02-15 DIAGNOSIS — E785 Hyperlipidemia, unspecified: Secondary | ICD-10-CM | POA: Diagnosis not present

## 2021-02-15 DIAGNOSIS — R5381 Other malaise: Secondary | ICD-10-CM | POA: Diagnosis not present

## 2021-02-15 DIAGNOSIS — C9110 Chronic lymphocytic leukemia of B-cell type not having achieved remission: Secondary | ICD-10-CM | POA: Diagnosis not present

## 2021-02-15 DIAGNOSIS — R5383 Other fatigue: Secondary | ICD-10-CM | POA: Diagnosis not present

## 2021-02-15 DIAGNOSIS — D689 Coagulation defect, unspecified: Secondary | ICD-10-CM | POA: Diagnosis not present

## 2021-02-15 DIAGNOSIS — I10 Essential (primary) hypertension: Secondary | ICD-10-CM | POA: Diagnosis not present

## 2021-02-16 DIAGNOSIS — D0461 Carcinoma in situ of skin of right upper limb, including shoulder: Secondary | ICD-10-CM | POA: Diagnosis not present

## 2021-02-18 ENCOUNTER — Other Ambulatory Visit: Payer: Self-pay

## 2021-02-18 ENCOUNTER — Other Ambulatory Visit
Admission: RE | Admit: 2021-02-18 | Discharge: 2021-02-18 | Disposition: A | Discharge status: Home / Self Care | Payer: Medicare Other | Source: Ambulatory Visit | Attending: Adult Health | Admitting: Adult Health

## 2021-02-18 DIAGNOSIS — Z20822 Contact with and (suspected) exposure to covid-19: Secondary | ICD-10-CM | POA: Insufficient documentation

## 2021-02-18 DIAGNOSIS — Z01812 Encounter for preprocedural laboratory examination: Secondary | ICD-10-CM | POA: Insufficient documentation

## 2021-02-18 LAB — SARS CORONAVIRUS 2 (TAT 6-24 HRS): SARS Coronavirus 2: NEGATIVE

## 2021-02-22 ENCOUNTER — Encounter: Payer: Self-pay | Admitting: Adult Health

## 2021-02-22 ENCOUNTER — Other Ambulatory Visit: Payer: Self-pay

## 2021-02-22 ENCOUNTER — Ambulatory Visit (INDEPENDENT_AMBULATORY_CARE_PROVIDER_SITE_OTHER): Payer: Medicare Other | Admitting: Pulmonary Disease

## 2021-02-22 ENCOUNTER — Ambulatory Visit (INDEPENDENT_AMBULATORY_CARE_PROVIDER_SITE_OTHER): Payer: Medicare Other | Admitting: Adult Health

## 2021-02-22 DIAGNOSIS — I252 Old myocardial infarction: Secondary | ICD-10-CM | POA: Diagnosis not present

## 2021-02-22 DIAGNOSIS — Z902 Acquired absence of lung [part of]: Secondary | ICD-10-CM

## 2021-02-22 DIAGNOSIS — J449 Chronic obstructive pulmonary disease, unspecified: Secondary | ICD-10-CM | POA: Diagnosis not present

## 2021-02-22 DIAGNOSIS — I2510 Atherosclerotic heart disease of native coronary artery without angina pectoris: Secondary | ICD-10-CM | POA: Diagnosis not present

## 2021-02-22 LAB — PULMONARY FUNCTION TEST
DL/VA % pred: 87 %
DL/VA: 3.64 ml/min/mmHg/L
DLCO cor % pred: 70 %
DLCO cor: 12.6 ml/min/mmHg
DLCO unc % pred: 71 %
DLCO unc: 12.75 ml/min/mmHg
FEF 25-75 Post: 0.84 L/sec
FEF 25-75 Pre: 0.69 L/sec
FEF2575-%Change-Post: 22 %
FEF2575-%Pred-Post: 55 %
FEF2575-%Pred-Pre: 45 %
FEV1-%Change-Post: 5 %
FEV1-%Pred-Post: 76 %
FEV1-%Pred-Pre: 71 %
FEV1-Post: 1.45 L
FEV1-Pre: 1.38 L
FEV1FVC-%Change-Post: 0 %
FEV1FVC-%Pred-Pre: 84 %
FEV6-%Change-Post: 5 %
FEV6-%Pred-Post: 94 %
FEV6-%Pred-Pre: 89 %
FEV6-Post: 2.3 L
FEV6-Pre: 2.18 L
FEV6FVC-%Pred-Post: 105 %
FEV6FVC-%Pred-Pre: 105 %
FVC-%Change-Post: 5 %
FVC-%Pred-Post: 89 %
FVC-%Pred-Pre: 85 %
FVC-Post: 2.3 L
FVC-Pre: 2.18 L
Post FEV1/FVC ratio: 63 %
Post FEV6/FVC ratio: 100 %
Pre FEV1/FVC ratio: 63 %
Pre FEV6/FVC Ratio: 100 %
RV % pred: 95 %
RV: 2.13 L
TLC % pred: 96 %
TLC: 4.63 L

## 2021-02-22 NOTE — Assessment & Plan Note (Signed)
Continue with follow-up with hematology

## 2021-02-22 NOTE — Patient Instructions (Addendum)
Continue on Anoro daily Activity as tolerated Albuterol Inhaler As needed   Follow-up in 6 months and as needed with Dr Valeta Harms
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
## 2021-02-22 NOTE — Patient Instructions (Signed)
Full PFT performed today. °

## 2021-02-22 NOTE — Assessment & Plan Note (Signed)
COPD appears stable.  Pulmonary function testing shows stable moderate airflow obstruction.  She had no perceived benefit with Trelegy versus Anoro.  She is to remain on Anoro for now.  Continue with activity as tolerated.  Plan  Patient Instructions  Continue on Anoro daily Activity as tolerated Albuterol Inhaler As needed   Follow-up in 6 months and as needed with Dr Valeta Harms

## 2021-02-22 NOTE — Progress Notes (Signed)
Full PFT performed today. °

## 2021-02-23 NOTE — Progress Notes (Signed)
Thanks for seeing her Garner Nash, DO [facility] 02/23/2021 10:50 AM

## 2021-02-26 ENCOUNTER — Other Ambulatory Visit: Payer: Self-pay | Admitting: Cardiovascular Disease

## 2021-02-28 ENCOUNTER — Telehealth: Payer: Self-pay | Admitting: Adult Health

## 2021-02-28 ENCOUNTER — Telehealth: Payer: Self-pay | Admitting: Cardiovascular Disease

## 2021-02-28 MED ORDER — NITROGLYCERIN 0.4 MG SL SUBL: 0.4000 mg | SUBLINGUAL_TABLET | SUBLINGUAL | 2 refills | Status: DC | PRN

## 2021-02-28 NOTE — Telephone Encounter (Signed)
Called and left a detailed message for the patient informing her that a Rx for Nitroglycerin has been sent to her pharmacy on file which is [facility] in [place] on [address] and to give our office a call if she has any questions.

## 2021-02-28 NOTE — Telephone Encounter (Signed)
*  STAT* If patient is at the pharmacy, call can be transferred to refill team.   1. Which medications need to be refilled? (please list name of each medication and dose if known) nitroGLYCERIN (NITROSTAT) 0.4 MG SL tablet  2. Which pharmacy/location (including street and city if local pharmacy) is medication to be sent to? [facility] [place], [address] [facility] [address]  3. Do they need a 30 day or 90 day supply? [place]

## 2021-02-28 NOTE — Telephone Encounter (Signed)
Called and spoke with pt and she is aware that TP is out of the office until Monday.  She is fine with waiting until TP comes back to discuss this with TP.    Pt stated that TP mentioned to her at her last OV that she may have a touch of asthma.  The pt stated that she had never been told that before so she wanted to speak with TP about this.

## 2021-03-08 NOTE — Telephone Encounter (Signed)
She has COPD , Her PFT supported that she has Moderate COPD .  Sorry if Asthma was mentioned it is more supportive of COPD .

## 2021-03-08 NOTE — Telephone Encounter (Signed)
I called and spoke with patient regarding [name] recs. She verbalized understanding and nothing further was needed.

## 2021-03-14 ENCOUNTER — Encounter: Payer: Self-pay | Admitting: Cardiovascular Disease

## 2021-03-14 ENCOUNTER — Ambulatory Visit (INDEPENDENT_AMBULATORY_CARE_PROVIDER_SITE_OTHER): Payer: Medicare Other

## 2021-03-14 ENCOUNTER — Other Ambulatory Visit: Payer: Self-pay

## 2021-03-14 ENCOUNTER — Ambulatory Visit (INDEPENDENT_AMBULATORY_CARE_PROVIDER_SITE_OTHER): Payer: Medicare Other | Admitting: Cardiovascular Disease

## 2021-03-14 VITALS — BP 120/76 | HR 51 | Ht 63.0 in | Wt 160.0 lb

## 2021-03-14 DIAGNOSIS — I739 Peripheral vascular disease, unspecified: Secondary | ICD-10-CM | POA: Diagnosis not present

## 2021-03-14 DIAGNOSIS — C9110 Chronic lymphocytic leukemia of B-cell type not having achieved remission: Secondary | ICD-10-CM | POA: Diagnosis not present

## 2021-03-14 DIAGNOSIS — I252 Old myocardial infarction: Secondary | ICD-10-CM

## 2021-03-14 DIAGNOSIS — Z5181 Encounter for therapeutic drug level monitoring: Secondary | ICD-10-CM

## 2021-03-14 DIAGNOSIS — I2510 Atherosclerotic heart disease of native coronary artery without angina pectoris: Secondary | ICD-10-CM | POA: Diagnosis not present

## 2021-03-14 DIAGNOSIS — I10 Essential (primary) hypertension: Secondary | ICD-10-CM

## 2021-03-14 DIAGNOSIS — I480 Paroxysmal atrial fibrillation: Secondary | ICD-10-CM | POA: Diagnosis not present

## 2021-03-14 DIAGNOSIS — J449 Chronic obstructive pulmonary disease, unspecified: Secondary | ICD-10-CM | POA: Diagnosis not present

## 2021-03-14 DIAGNOSIS — Z7901 Long term (current) use of anticoagulants: Secondary | ICD-10-CM | POA: Diagnosis not present

## 2021-03-14 DIAGNOSIS — E785 Hyperlipidemia, unspecified: Secondary | ICD-10-CM

## 2021-03-14 LAB — POCT INR: INR: 3.2 — AB (ref 2.0–3.0)

## 2021-03-14 MED ORDER — EZETIMIBE 10 MG PO TABS: 10.0000 mg | ORAL_TABLET | Freq: Every day | ORAL | 3 refills | Status: DC

## 2021-03-14 NOTE — Patient Instructions (Signed)
Hold tonight only and then continue warfarin dose 2.5mg  daily except for 1.25mg  every Tuesday. Repeat INR in 6 weeks.

## 2021-03-14 NOTE — Patient Instructions (Signed)
Medication Instructions:   No changes  *If you need a refill on your cardiac medications before your next appointment, please call your pharmacy*   Lab Work: Not needed    Testing/Procedures: Not needed   Follow-Up: At [facility], you and your health needs are our priority.  As part of our continuing mission to provide you with exceptional heart care, we have created designated Provider Care Teams.  These Care Teams include your primary Cardiologist (physician) and Advanced Practice Providers (APPs -  Physician Assistants and Nurse Practitioners) who all work together to provide you with the care you need, when you need it.     Your next appointment:   6 month(s)  The format for your next appointment:   In Person  Provider:   Shelva Majestic, MD

## 2021-03-15 ENCOUNTER — Encounter: Payer: Self-pay | Admitting: Cardiovascular Disease

## 2021-03-15 NOTE — Progress Notes (Signed)
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  'CGsXY$ ALT 0 - 44 U/L  $'18 15 19  'p$ Alk Phosphatase 38 - 126 U/L 45 53 55  Total Bilirubin 0.3 - 1.2 mg/dL 0.7 0.4 0.4  Bilirubin, Direct 0.0 - 0.5 mg/dL - - -   CBC Latest Ref Rng & Units 09/10/2020 08/11/2020 06/01/2020  WBC 4.0 - 10.5 K/uL 11.7(H) 12.0(H) 10.3  Hemoglobin 12.0 - 15.0 g/dL 13.5 14.0 12.6  Hematocrit 36.0 - 46.0 % 39.2 41.1 36.8  Platelets 150 - 400 K/uL 162 178 265   Lab Results  Component Value Date   MCV 89.5 09/10/2020   MCV 93 08/11/2020   MCV 96 06/01/2020   Lipid Panel     Component Value Date/Time   CHOL 180 04/27/2020 0928   CHOL 168 09/05/2013 0938   TRIG 79 04/27/2020 0928   TRIG 49 09/05/2013 0938   HDL 71 04/27/2020 0928   HDL 79 09/05/2013 0938   CHOLHDL 2.5 04/27/2020 0928   CHOLHDL 2.6 10/26/2016 1140   VLDL 22 10/26/2016 1140   LDLCALC 94 04/27/2020 0928   LDLCALC 79 09/05/2013 0938     RADIOLOGY: No results found.  IMPRESSION:  1. Coronary artery disease with hx of myocardial infarct and hx of CABG   2. Essential hypertension   3. Hyperlipidemia LDL goal <70   4. Paroxysmal atrial fibrillation (HCC)   5. Long term current use of anticoagulant therapy   6. PVD (peripheral vascular disease) ([place])   7. Claudication in peripheral vascular disease ([place])   8. Chronic obstructive pulmonary disease, unspecified COPD type ([place])   9. CLL (chronic lymphocytic leukemia) ([place]): stage 0     ASSESSMENT AND PLAN: Ms. Placzek is a 75 year-old white  female who underwent CABG revascularization surgery in November 2008.  Prior to surgery she had smoked for over 30 years.  She has not required supplemental nitroglycerin use for her coronary obstructive disease.  A nuclear stress test in 2015 was normal.  A follow-up nuclear perfusion study in April 2018 was unchanged and continued to show normal perfusion and LV function.  When seen in February 2022 she had experienced some left lateral chest discomfort.  A repeat Lexiscan Myoview study in March 2022 remained stable and unchanged and showed normal perfusion without scar or ischemia.  She has a history of paroxysmal atrial fibrillation and had previously undergone cardioversion.   She is on warfarin for anticoagulation due to the cost of DOACs. She was  found to have a spiculated hypermetabolic lung nodule on CT which had progressed in size and underwent a VATS procedure for resection.  This was complicated by postoperative bleeding 2 days later, requiring surgical exploration.  She again developed postoperative atrial fibrillation and had been maintained on amiodarone , but this ultimately was discontinued due to bradycardia.  She developed progressive claudication symptoms and was found to have high-grade calcified bilateral iliac stenoses and underwent orbital atherectomy, PTA, and covered stenting using kissing technique both iliac arteries.  She subsequently developed a retroperitoneal bleed later that night but ultimately hemoglobin stabilized.  She has had follow-up evaluations with Dr.Berry and has had some residual right calf claudication symptomatology.  This has remained stable.  She is now walking at least a half a mile a day.  Currently she is not having anginal symptomatology.  She self discontinued her amlodipine since she felt this significantly made her more fatigued as well as contributing to some edema.  Her blood pressure today off amlodipine remained stable on isosorbide 30 mg twice a day,  lisinopril 20 mg  twice a day, metoprolol succinate which apparently she is taking 12.5 mg twice a day rather than 25 mg daily.  She continues to be on combination therapy with rosuvastatin 20 mg and Zetia 10 mg with target LDL less than 70.  She continues to be on warfarin for anticoagulation and is unaware of any recurrent atrial fibrillation.  She has COPD and  recently underwent pulmonary function studies and was seen by pulmonary who felt she was stable.  She has been followed at [name] for her CLL currently not requiring treatment.  I will see her in 6 months for reevaluation or sooner as needed.  Troy Sine, MD, [employer]  03/15/2021 9:44 AM

## 2021-03-18 ENCOUNTER — Telehealth: Payer: Self-pay | Admitting: Cardiovascular Disease

## 2021-03-18 NOTE — Telephone Encounter (Signed)
Patient called to ask if her bp does go up and she take extra 10mg  of lisinopril (ZESTRIL) 20 MG tablet. She stated the her PCP told her she should be able to take more. Please advise

## 2021-03-18 NOTE — Telephone Encounter (Signed)
Called patient back. She states that she would like to know if at times when her blood pressure is high can she take an extra lisinopril- I asked patient when she was having higher blood pressure as her visit on Monday with Dr.Kelly blood pressure was good. She states that it is always good in the evenings, but higher 140/high 80's in the morning. I did advise patient to keep a blood pressure log and check at different times during the day and let us know what those readings are so we can see if any other adjustments need to be made.   Patient also states that her office note states that she had edema (she states this is not correct and would like to have it removed from her note) I advised I would route to MD to advise.   Patient thankful for call back, would call back next week with her blood pressure readings.

## 2021-03-23 NOTE — Telephone Encounter (Signed)
In my last office note on the physical exam it is noted that she did not have any edema. At present we will continue with her current regimen of lisinopril.  Continue to monitor blood pressure.  If her blood pressure is consistently staying over 140 mg throughout the day then she can take an extra 10 mg but otherwise I will continue her present regimen

## 2021-03-24 NOTE — Telephone Encounter (Signed)
Pt called back and said she is unable to send the blood pressure readings on MYchart because it's a long message, Pt states she will hand deliver it tomorrow.

## 2021-03-24 NOTE — Telephone Encounter (Signed)
Pt informed of Dr. Evette Georges recommendations and state she is currently drafting up a mychart message with BP readings and will wait for further instructions once he review results.

## 2021-03-25 ENCOUNTER — Other Ambulatory Visit: Payer: Self-pay

## 2021-03-25 DIAGNOSIS — Z85118 Personal history of other malignant neoplasm of bronchus and lung: Secondary | ICD-10-CM | POA: Insufficient documentation

## 2021-03-25 DIAGNOSIS — Y9301 Activity, walking, marching and hiking: Secondary | ICD-10-CM | POA: Diagnosis not present

## 2021-03-25 DIAGNOSIS — S161XXA Strain of muscle, fascia and tendon at neck level, initial encounter: Secondary | ICD-10-CM | POA: Diagnosis not present

## 2021-03-25 DIAGNOSIS — J3489 Other specified disorders of nose and nasal sinuses: Secondary | ICD-10-CM | POA: Diagnosis not present

## 2021-03-25 DIAGNOSIS — S199XXA Unspecified injury of neck, initial encounter: Secondary | ICD-10-CM | POA: Diagnosis not present

## 2021-03-25 DIAGNOSIS — J984 Other disorders of lung: Secondary | ICD-10-CM | POA: Diagnosis not present

## 2021-03-25 DIAGNOSIS — Y92009 Unspecified place in unspecified non-institutional (private) residence as the place of occurrence of the external cause: Secondary | ICD-10-CM | POA: Insufficient documentation

## 2021-03-25 DIAGNOSIS — I6523 Occlusion and stenosis of bilateral carotid arteries: Secondary | ICD-10-CM | POA: Diagnosis not present

## 2021-03-25 DIAGNOSIS — Z79899 Other long term (current) drug therapy: Secondary | ICD-10-CM | POA: Diagnosis not present

## 2021-03-25 DIAGNOSIS — Z87891 Personal history of nicotine dependence: Secondary | ICD-10-CM | POA: Diagnosis not present

## 2021-03-25 DIAGNOSIS — I10 Essential (primary) hypertension: Secondary | ICD-10-CM | POA: Insufficient documentation

## 2021-03-25 DIAGNOSIS — I252 Old myocardial infarction: Secondary | ICD-10-CM | POA: Insufficient documentation

## 2021-03-25 DIAGNOSIS — S0990XA Unspecified injury of head, initial encounter: Secondary | ICD-10-CM | POA: Diagnosis not present

## 2021-03-25 DIAGNOSIS — Z951 Presence of aortocoronary bypass graft: Secondary | ICD-10-CM | POA: Diagnosis not present

## 2021-03-25 DIAGNOSIS — W1830XA Fall on same level, unspecified, initial encounter: Secondary | ICD-10-CM | POA: Insufficient documentation

## 2021-03-25 DIAGNOSIS — I25810 Atherosclerosis of coronary artery bypass graft(s) without angina pectoris: Secondary | ICD-10-CM | POA: Diagnosis not present

## 2021-03-25 DIAGNOSIS — J449 Chronic obstructive pulmonary disease, unspecified: Secondary | ICD-10-CM | POA: Diagnosis not present

## 2021-03-25 DIAGNOSIS — G9389 Other specified disorders of brain: Secondary | ICD-10-CM | POA: Diagnosis not present

## 2021-03-25 DIAGNOSIS — Z85828 Personal history of other malignant neoplasm of skin: Secondary | ICD-10-CM | POA: Diagnosis not present

## 2021-03-25 DIAGNOSIS — Z91040 Latex allergy status: Secondary | ICD-10-CM | POA: Insufficient documentation

## 2021-03-25 DIAGNOSIS — J439 Emphysema, unspecified: Secondary | ICD-10-CM | POA: Diagnosis not present

## 2021-03-26 ENCOUNTER — Encounter (HOSPITAL_COMMUNITY): Payer: Self-pay

## 2021-03-26 ENCOUNTER — Emergency Department (HOSPITAL_COMMUNITY)
Admission: EM | Admit: 2021-03-26 | Discharge: 2021-03-26 | Disposition: A | Discharge status: Home / Self Care | Payer: Medicare Other | Attending: Emergency Medicine | Admitting: Emergency Medicine

## 2021-03-26 ENCOUNTER — Other Ambulatory Visit: Payer: Self-pay

## 2021-03-26 ENCOUNTER — Emergency Department (HOSPITAL_COMMUNITY): Payer: Medicare Other

## 2021-03-26 DIAGNOSIS — J439 Emphysema, unspecified: Secondary | ICD-10-CM | POA: Diagnosis not present

## 2021-03-26 DIAGNOSIS — S161XXA Strain of muscle, fascia and tendon at neck level, initial encounter: Secondary | ICD-10-CM

## 2021-03-26 DIAGNOSIS — G9389 Other specified disorders of brain: Secondary | ICD-10-CM | POA: Diagnosis not present

## 2021-03-26 DIAGNOSIS — I6523 Occlusion and stenosis of bilateral carotid arteries: Secondary | ICD-10-CM | POA: Diagnosis not present

## 2021-03-26 DIAGNOSIS — S0990XA Unspecified injury of head, initial encounter: Secondary | ICD-10-CM

## 2021-03-26 DIAGNOSIS — J3489 Other specified disorders of nose and nasal sinuses: Secondary | ICD-10-CM | POA: Diagnosis not present

## 2021-03-26 DIAGNOSIS — W19XXXA Unspecified fall, initial encounter: Secondary | ICD-10-CM

## 2021-03-26 DIAGNOSIS — Y92009 Unspecified place in unspecified non-institutional (private) residence as the place of occurrence of the external cause: Secondary | ICD-10-CM

## 2021-03-26 DIAGNOSIS — J984 Other disorders of lung: Secondary | ICD-10-CM | POA: Diagnosis not present

## 2021-03-26 DIAGNOSIS — S199XXA Unspecified injury of neck, initial encounter: Secondary | ICD-10-CM | POA: Diagnosis not present

## 2021-03-26 LAB — PROTIME-INR
INR: 1.8 — ABNORMAL HIGH (ref 0.8–1.2)
Prothrombin Time: 21 seconds — ABNORMAL HIGH (ref 11.4–15.2)
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
## 2021-03-26 NOTE — ED Provider Notes (Signed)
MSE was initiated and I personally evaluated the patient and placed orders (if any) at  12:13 AM on Mar 26, 2021.  Patient to ED after mechanical fall around 5:00 pm today. On coumadin. No LOC, nausea. Reports mild neck pain. She presents at this time because she noticed swelling over her right eye. No significant headache. No other injury.   Today's Vitals   03/26/21 0005 03/26/21 0008 03/26/21 0009  BP: (!) 191/80    Pulse: 62    Resp: 19    Temp: 97.9 F (36.6 C)    TempSrc: Oral    SpO2: 100%    Weight:   72.6 kg  Height:   5\' 3"  (1.6 m)  PainSc:  6     Body mass index is 28.35 kg/m.  Mild eyebrow swelling on right. FROM of the eye without pain. No bony deformity. No midline neck tenderness. No chest tenderness No abdominal tenderness Moves all extremities without pain or limitation.  The patient appears stable so that the remainder of the MSE may be completed by another provider.   Charlann Lange, PA-C 03/26/21 0015    Orpah Greek, MD 03/26/21 [phone]

## 2021-03-26 NOTE — ED Triage Notes (Signed)
Patient arrives from home, level 2 fall on Coumadin, reports she fell around 5 pm striking her R head, denies LOC, reports pain over R eye

## 2021-03-26 NOTE — ED Notes (Signed)
The pt fell 1700 today  Since 2100 she has had some soreness in her forehead and she thinks some sl swelling  She stumbled and fell at home when she stumbled

## 2021-03-26 NOTE — ED Notes (Signed)
Pt's phone given to security to pick up. Pt aware

## 2021-03-29 ENCOUNTER — Telehealth: Payer: Self-pay | Admitting: *Deleted

## 2021-03-29 ENCOUNTER — Other Ambulatory Visit: Payer: Self-pay | Admitting: Cardiovascular Disease

## 2021-03-29 DIAGNOSIS — K5904 Chronic idiopathic constipation: Secondary | ICD-10-CM | POA: Diagnosis not present

## 2021-03-29 DIAGNOSIS — I2510 Atherosclerotic heart disease of native coronary artery without angina pectoris: Secondary | ICD-10-CM | POA: Diagnosis not present

## 2021-03-29 DIAGNOSIS — Z23 Encounter for immunization: Secondary | ICD-10-CM | POA: Diagnosis not present

## 2021-03-29 DIAGNOSIS — Z800 Family history of malignant neoplasm of digestive organs: Secondary | ICD-10-CM | POA: Diagnosis not present

## 2021-03-29 DIAGNOSIS — K5730 Diverticulosis of large intestine without perforation or abscess without bleeding: Secondary | ICD-10-CM | POA: Diagnosis not present

## 2021-03-29 DIAGNOSIS — Z9049 Acquired absence of other specified parts of digestive tract: Secondary | ICD-10-CM | POA: Diagnosis not present

## 2021-03-29 NOTE — Telephone Encounter (Signed)
   [facility] Pre-operative Risk Assessment    Patient Name: [name]  DOB: 10-23-1946  MRN: [id number]     Request for surgical clearance 1. What type of surgery is being performed?  colonoscopy    2. When is this surgery scheduled? 06/22/21 At [facility]  3. What type of clearance is required (medical clearance vs. Pharmacy clearance to hold med vs. Both)? both  4. Are there any medications that need to be held prior to surgery and how long?warfarin   5. Practice name and name of physician performing surgery?  [facility] gastro[facility]ology dept; Dr Kathline Magic MD  6. What is the office phone number?  [phone]   7.   What is the office fax number? [phone]  8.   Anesthesia type (None, local, MAC, general) ? monitored   Raiford Simmonds 03/29/2021, 5:37 PM  _________________________________________________________________   (provider comments below)

## 2021-03-30 ENCOUNTER — Ambulatory Visit (INDEPENDENT_AMBULATORY_CARE_PROVIDER_SITE_OTHER): Payer: Medicare Other | Admitting: Internal Medicine

## 2021-03-30 ENCOUNTER — Encounter: Payer: Self-pay | Admitting: Internal Medicine

## 2021-03-30 ENCOUNTER — Other Ambulatory Visit: Payer: Self-pay

## 2021-03-30 VITALS — BP 148/78 | HR 67 | Ht 63.0 in | Wt 160.0 lb

## 2021-03-30 DIAGNOSIS — I2510 Atherosclerotic heart disease of native coronary artery without angina pectoris: Secondary | ICD-10-CM

## 2021-03-30 DIAGNOSIS — I480 Paroxysmal atrial fibrillation: Secondary | ICD-10-CM | POA: Diagnosis not present

## 2021-03-30 DIAGNOSIS — I252 Old myocardial infarction: Secondary | ICD-10-CM

## 2021-03-30 DIAGNOSIS — I4892 Unspecified atrial flutter: Secondary | ICD-10-CM | POA: Diagnosis not present

## 2021-03-30 DIAGNOSIS — R001 Bradycardia, unspecified: Secondary | ICD-10-CM

## 2021-03-30 MED ORDER — HYDRALAZINE HCL 10 MG PO TABS
10.0000 mg | ORAL_TABLET | Freq: Three times a day (TID) | ORAL | 0 refills | Status: DC
Start: 2021-03-30 — End: 2021-04-18

## 2021-03-30 MED ORDER — CARVEDILOL 6.25 MG PO TABS: 6.2500 mg | ORAL_TABLET | Freq: Two times a day (BID) | ORAL | 3 refills | Status: DC

## 2021-03-30 NOTE — Telephone Encounter (Signed)
BP log received and will be placed in Dr. Evette Georges folder for review.

## 2021-03-30 NOTE — Patient Instructions (Addendum)
Medication Instructions:  Your physician has recommended you make the following change in your medication:   ** Stop Metoprolol Succinate  **  Begin Carvedilol 6.25mg  - 1 tablet by mouth twice daily  ** Take Hyrdalazine 10mg  - 1 tablet by mouth every 8 hours as needed  *If you need a refill on your cardiac medications before your next appointment, please call your pharmacy*   Lab Work: None ordered.  If you have labs (blood work) drawn today and your tests are completely normal, you will receive your results only by: [name] MyChart Message (if you have MyChart) OR . A paper copy in the mail If you have any lab test that is abnormal or we need to change your treatment, we will call you to review the results.   Testing/Procedures: None ordered.    Follow-Up: At [facility], you and your health needs are our priority.  As part of our continuing mission to provide you with exceptional heart care, we have created designated Provider Care Teams.  These Care Teams include your primary Cardiologist (physician) and Advanced Practice Providers (APPs -  Physician Assistants and Nurse Practitioners) who all work together to provide you with the care you need, when you need it.  We recommend signing up for the patient portal called "MyChart".  Sign up information is provided on this After Visit Summary.  MyChart is used to connect with patients for Virtual Visits (Telemedicine).  Patients are able to view lab/test results, encounter notes, upcoming appointments, etc.  Non-urgent messages can be sent to your provider as well.   To learn more about what you can do with MyChart, go to [email or web address].    Your next appointment:   12 month(s)  The format for your next appointment:   In Person  Provider:   Dr Caryl Comes

## 2021-03-30 NOTE — Telephone Encounter (Signed)
Pt has appt 03/30/2021 with Dr. Caryl Comes. Will route to him to address clearance at OV today. Then, will route to PharmD for rec's on holding anticoagulation. Richardson Dopp, PA-C    03/30/2021 1:41 PM
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
## 2021-03-31 ENCOUNTER — Other Ambulatory Visit: Payer: Self-pay

## 2021-03-31 MED ORDER — WARFARIN SODIUM 2.5 MG PO TABS: ORAL_TABLET | ORAL | 0 refills | Status: DC

## 2021-04-01 ENCOUNTER — Telehealth: Payer: Self-pay | Admitting: Internal Medicine

## 2021-04-01 NOTE — Telephone Encounter (Signed)
Pt c/o medication issue:  1. Name of Medication:   carvedilol (COREG) 6.25 MG tablet  lisinopril (ZESTRIL) 20 MG tablet   2. How are you currently taking this medication (dosage and times per day)? 1 tablet twice a day of both medications  3. Are you having a reaction (difficulty breathing--STAT)? No  4. What is your medication issue? Patient states she was started on carvedilol yesterday and has been feeling weak and dizzy. She states she has also been having headaches. She states her BP got down to 96/something yesterday and stayed around 111/60. She states she feels like her BP is high, but when she checks it it is low. She states this morning it was 131/67 and a few minutes ago it was 124/63 HR 66. She states she is also taking lisinopril. She states yesterday she did not take her evening dose of the carvedilol, because her BP was so low and she was "afraid she would not wake up". She would like to know if her medications need to be adjusted. She states she is not having any other symptoms.

## 2021-04-01 NOTE — Telephone Encounter (Signed)
Spoke with the patient who states that she started on the carvedilol 6.25 mg twice daily and has not been tolerating it. She states that yesterday she felt very weak and dizzy. Her BP got as low as 96/? But average was around 110-120/?. She states that she didn't feel like she was going to pass out but felt very unsteady. She did not take her evening dose last night. This AM her BP was 132/63 and HR 66. She took both carvedilol and lisinopril today. Recent blood pressure was 124/63 and HR 56. She is still having some dizziness and weakness. Advised patient to ensure she is staying hydrated and okay to hold evening dose if still symptomatic. Per Dr. Olin Pia note patient can try labetalol if intolerant to carvedilol. Will send to Dr. Caryl Comes for advisement.

## 2021-04-04 NOTE — Telephone Encounter (Signed)
Spoke with the patient and advised her on recommendations per Dr. Caryl Comes. Patient states that she has not taken her carvedilol since last Friday and does not want to get back on it. She states that she feels much better off of it and does not want to take it anymore. Her systolic blood pressure has been 130s-140s and heart rate in the 60s.

## 2021-04-04 NOTE — Telephone Encounter (Signed)
[name]  good am Could have her try dropping her lisinopril to 10 mg daily and see how her HR and her ( high) BP does Thanks SK

## 2021-04-06 NOTE — Telephone Encounter (Signed)
Will route to PharmD for rec's re: holding anticoagulation. Richardson Dopp, PA-C    04/06/2021 10:54 AM

## 2021-04-06 NOTE — Telephone Encounter (Signed)
   Name: [name] DOB: 07/22/1946  MRN: [id number]  Primary Cardiologist: Shelva Majestic, MD EP:  Virl Axe, MD   Chart reviewed as part of pre-operative protocol coverage.   75 y.o. female with . Coronary artery disease s/p CABG in 2008 . Myoview 3/22: low risk  . Peripheral arterial disease  . S/p bilat iliac artery stenting in 04/2020 . Paroxysmal atrial fibrillation  . Atrial flutter  . Squamous cell lung CA  . COPD . Chronic Lymphocytic Leukemia   . Echocardiogram 3/22: normal EF, no sig valve dz  Last OV:  03/14/21 with Dr. Claiborne Billings Procedure:  Colo  Rx:  Hold warfarin  RCRI:  Perioperative Risk of Major Cardiac Event is (%): 0.9 (low risk)   Patient was contacted 04/06/2021 in reference to pre-operative risk assessment for pending surgery as outlined below.  She has not been having chest pain.  She has chronic shortness of breath without change.  We reviewed recommendations from the pharmacy team.  I also explained that she can proceed with the colonoscopy at acceptable risk.    The patient is concerned that it is too early to be cleared for her procedure.  She would like to follow up with someone regarding recommendations when it gets closer to the procedure.  Therefore, I have not sent in notes to GI yet.  Will leave in preop pool so that patient can be contacted early July to confirm stability of symptoms, review recommendations for holding anticoagulation and fax notes to GI.  Please call with questions. Richardson Dopp, PA-C 04/06/2021, 4:18 PM

## 2021-04-06 NOTE — Telephone Encounter (Signed)
Patient with diagnosis of afib on warfarin for anticoagulation.    Procedure: colonoscopy Date of procedure: 06/22/21  CHA2DS2-VASc Score = 6  This indicates a 9.7% annual risk of stroke. The patient's score is based upon: CHF History: Yes HTN History: Yes Diabetes History: No Stroke History: No Vascular Disease History: Yes Age Score: 2 Gender Score: 1     CrCl 76 ml/min Platelet count 222  Per office protocol, patient can hold warfarin for 5 days prior to procedure.   Patient will NOT need bridging with Lovenox (enoxaparin) around procedure.

## 2021-04-07 NOTE — Telephone Encounter (Signed)
Spoke with the patient and advised her that per Dr. Caryl Comes it is okay for her to be off of carvedilol. Patient will monitor her BP and report back next week.

## 2021-04-07 NOTE — Telephone Encounter (Signed)
Concerns addressed at ov on 5/18 with Dr. Caryl Comes.

## 2021-04-18 ENCOUNTER — Encounter: Payer: Self-pay | Admitting: Cardiovascular Disease

## 2021-04-18 ENCOUNTER — Other Ambulatory Visit: Payer: Self-pay

## 2021-04-18 ENCOUNTER — Ambulatory Visit (INDEPENDENT_AMBULATORY_CARE_PROVIDER_SITE_OTHER): Payer: Medicare Other | Admitting: Cardiovascular Disease

## 2021-04-18 ENCOUNTER — Telehealth: Payer: Self-pay | Admitting: Cardiovascular Disease

## 2021-04-18 VITALS — BP 118/72 | HR 64 | Ht 63.0 in | Wt 159.0 lb

## 2021-04-18 DIAGNOSIS — I252 Old myocardial infarction: Secondary | ICD-10-CM

## 2021-04-18 DIAGNOSIS — Z7901 Long term (current) use of anticoagulants: Secondary | ICD-10-CM

## 2021-04-18 DIAGNOSIS — C9110 Chronic lymphocytic leukemia of B-cell type not having achieved remission: Secondary | ICD-10-CM

## 2021-04-18 DIAGNOSIS — I4892 Unspecified atrial flutter: Secondary | ICD-10-CM

## 2021-04-18 DIAGNOSIS — I739 Peripheral vascular disease, unspecified: Secondary | ICD-10-CM

## 2021-04-18 DIAGNOSIS — E785 Hyperlipidemia, unspecified: Secondary | ICD-10-CM | POA: Diagnosis not present

## 2021-04-18 DIAGNOSIS — I2510 Atherosclerotic heart disease of native coronary artery without angina pectoris: Secondary | ICD-10-CM | POA: Diagnosis not present

## 2021-04-18 DIAGNOSIS — I10 Essential (primary) hypertension: Secondary | ICD-10-CM

## 2021-04-18 DIAGNOSIS — J449 Chronic obstructive pulmonary disease, unspecified: Secondary | ICD-10-CM | POA: Diagnosis not present

## 2021-04-18 MED ORDER — DOXAZOSIN MESYLATE 1 MG PO TABS: 1.0000 mg | ORAL_TABLET | Freq: Every day | ORAL | 6 refills | Status: DC

## 2021-04-18 NOTE — Patient Instructions (Signed)
Medication Instructions:  BEGIN doxazosin (Cardura) 1mg  daily at bedtime.   *If you need a refill on your cardiac medications before your next appointment, please call your pharmacy*   Lab Work: None ordered.     Testing/Procedures: None ordered.    Follow-Up: At [facility], you and your health needs are our priority.  As part of our continuing mission to provide you with exceptional heart care, we have created designated Provider Care Teams.  These Care Teams include your primary Cardiologist (physician) and Advanced Practice Providers (APPs -  Physician Assistants and Nurse Practitioners) who all work together to provide you with the care you need, when you need it.  We recommend signing up for the patient portal called "MyChart".  Sign up information is provided on this After Visit Summary.  MyChart is used to connect with patients for Virtual Visits (Telemedicine).  Patients are able to view lab/test results, encounter notes, upcoming appointments, etc.  Non-urgent messages can be sent to your provider as well.   To learn more about what you can do with MyChart, go to [email or web address].    Your next appointment:   4-6 month(s)  The format for your next appointment:   In Person  Provider:   Shelva Majestic, MD

## 2021-04-18 NOTE — Telephone Encounter (Signed)
Patient states she is returning a call. She states it was not the appointment reminder and that Dr. Evette Georges nurse was going to call her back about getting something done. I did not see any notes.

## 2021-04-18 NOTE — Telephone Encounter (Signed)
Routed to [employer] who is covering for Dr. Claiborne Billings today

## 2021-04-18 NOTE — Telephone Encounter (Signed)
This RN called patient back, this RN had attempted a call earlier but call had been dropped. This RN was calling per Ms. Cibrian's request to call when her BP log was given to Dr. Claiborne Billings for review. This RN printed BP log present in 04/14/2021 MyChart message for Dr. Claiborne Billings, as patient requested. Patient verbalized thanks and understanding. All questions/concerns addressed at this time.

## 2021-04-19 DIAGNOSIS — R5383 Other fatigue: Secondary | ICD-10-CM | POA: Diagnosis not present

## 2021-04-19 DIAGNOSIS — C9110 Chronic lymphocytic leukemia of B-cell type not having achieved remission: Secondary | ICD-10-CM | POA: Diagnosis not present

## 2021-04-19 DIAGNOSIS — I2510 Atherosclerotic heart disease of native coronary artery without angina pectoris: Secondary | ICD-10-CM | POA: Diagnosis not present

## 2021-04-21 ENCOUNTER — Other Ambulatory Visit: Payer: Self-pay

## 2021-04-24 ENCOUNTER — Encounter: Payer: Self-pay | Admitting: Cardiovascular Disease

## 2021-04-24 NOTE — Progress Notes (Signed)
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 interval 427 ms.  Prior September 2015 ECG (independently read by me): Normal sinus rhythm at 60.  Incomplete right bundle branch block.  Prior ECG : Sinus bradycardia 54 beats per minute. Mild RV conduction delay.   LABS:  BMP Latest Ref Rng & Units 09/10/2020 05/15/2020 05/14/2020  Glucose 70 - 99 mg/dL 97 116(H) 125(H)  BUN 8 - 23 mg/dL 16 11 13  Creatinine 0.44 - 1.00 mg/dL 0.56 0.53 0.64  BUN/Creat Ratio 12 - 28 - - -  Sodium 135 - 145 mmol/L 133(L) 136 136  Potassium 3.5 - 5.1 mmol/L 4.3 4.1 3.6  Chloride 98 - 111 mmol/L 100 107 106  CO2 22 - 32 mmol/L 27 25 23  Calcium 8.9 - 10.3 mg/dL 8.6(L) 7.9(L) 7.5(L)   Hepatic Function Latest Ref Rng & Units 09/10/2020 04/27/2020 10/28/2019  Total Protein 6.5 - 8.1 g/dL 6.3(L) 6.0 6.0  Albumin 3.5 - 5.0 g/dL 4.0 4.2 4.2  AST 15 - 41 U/L 20 21 19  ALT 0 - 44 U/L 18 15 19  Alk Phosphatase 38 - 126 U/L 45 53 55  Total Bilirubin 0.3 - 1.2 mg/dL 0.7 0.4 0.4  Bilirubin, Direct 0.0 - 0.5 mg/dL - - -   CBC Latest Ref Rng & Units 09/10/2020 08/11/2020 06/01/2020  WBC 4.0 - 10.5 K/uL 11.7(H) 12.0(H) 10.3  Hemoglobin 12.0 - 15.0 g/dL 13.5 14.0 12.6  Hematocrit 36.0 - 46.0 % 39.2 41.1 36.8  Platelets 150 - 400 K/uL 162 178 265   Lab  Results  Component Value Date   MCV 89.5 09/10/2020   MCV 93 08/11/2020   MCV 96 06/01/2020   Lipid Panel     Component Value Date/Time   CHOL 180 04/27/2020 0928   CHOL 168 09/05/2013 0938   TRIG 79 04/27/2020 0928   TRIG 49 09/05/2013 0938   HDL 71 04/27/2020 0928   HDL 79 09/05/2013 0938   CHOLHDL 2.5 04/27/2020 0928   CHOLHDL 2.6 10/26/2016 1140   VLDL 22 10/26/2016 1140   LDLCALC 94 04/27/2020 0928   LDLCALC 79 09/05/2013 0938     RADIOLOGY: No results found.  IMPRESSION:  1. Coronary artery disease with history of myocardial infarction with history of CABG   2. Essential hypertension   3. Paroxysmal atrial flutter (HCC)   4. Long term current use of anticoagulant therapy   5. Hyperlipidemia LDL goal <70   6. CLL (chronic lymphocytic leukemia) (HCC): stage 0   7. Peripheral arterial disease (HCC)   8. Claudication in peripheral vascular disease (HCC)   9. Chronic obstructive pulmonary disease, unspecified COPD type (HCC)     ASSESSMENT AND PLAN: [name] is a 74 year-old white female who underwent CABG revascularization surgery in November 2008.  Prior to surgery she had smoked for over 30 years.  She has not required supplemental nitroglycerin use for her coronary obstructive disease.  A nuclear stress test in 2015 was normal.  A follow-up nuclear perfusion study in April 2018 was unchanged and continued to show normal perfusion and LV function.  When seen in February 2022 she had experienced some left lateral chest discomfort.  A repeat Lexiscan Myoview study in March 2022 remained stable and unchanged and showed normal perfusion without scar or ischemia.  She has a history of paroxysmal atrial fibrillation and had previously undergone   cardioversion.   She is on warfarin for anticoagulation due to the cost of DOACs. She was  found to have a spiculated hypermetabolic lung nodule on CT which had progressed in size and underwent a VATS procedure for resection.  This was  complicated by postoperative bleeding 2 days later, requiring surgical exploration.  She again developed postoperative atrial fibrillation and had been maintained on amiodarone , but this ultimately was discontinued due to bradycardia.  She developed progressive claudication symptoms and was found to have high-grade calcified bilateral iliac stenoses and underwent orbital atherectomy, PTA, and covered stenting using kissing technique both iliac arteries.  She subsequently developed a retroperitoneal bleed later that night but ultimately hemoglobin stabilized.  She has had follow-up evaluations with Dr.Berry and has had some residual right calf claudication symptomatology.  This has remained stable.  She is now walking at least a half a mile a day.  Currently she is not having anginal symptomatology.  She has not had any recurrent atrial fibrillation.  She continues to be on warfarin for anticoagulation.  Over the last several months, her medications have been adjusted.  She is no longer taking amlodipine.  She had seen Dr. Caryl Comes who recommended carvedilol in place of metoprolol succinate but apparently she did not tolerate this.  She also did not tolerate spironolactone, previous Cardizem, and as needed hydralazine.  She brought with her blood pressure log which at times shows stable blood pressure but other times blood pressure lability with systolics in the 196Q.  She continues to be on lisinopril 20 mg twice a day, metoprolol succinate 25 mg twice a day, and isosorbide 30 mg twice a day.  She is not having anginal symptoms.  With her blood pressure lability and intolerance to numerous medications I have suggested a trial of low-dose doxazosin to take 1 mg at bedtime.  Depending upon her response this can further be titrated to 2 mg or higher if necessary.  She continues to be on Zetia and rosuvastatin 20 mg for hyperlipidemia with target LDL less than 70.  In the past she had been approved for PCSK9 inhibition  but did not initiate therapy due to cost.  She is following at [facility] hematology for her CLL which apparently has been stable.  She sees Dr. [name] in follow-up of her squamous cell carcinoma status post VATS procedure.  She has not had recent significant complaints from her Sjogren's syndrome.  I will see her in 4 to 6 months for follow-up evaluation or sooner as needed.  Troy Sine, MD, [facility]  04/24/2021 1:47 PM

## 2021-04-25 ENCOUNTER — Other Ambulatory Visit: Payer: Self-pay

## 2021-04-25 ENCOUNTER — Ambulatory Visit (INDEPENDENT_AMBULATORY_CARE_PROVIDER_SITE_OTHER): Payer: Medicare Other

## 2021-04-25 DIAGNOSIS — Z5181 Encounter for therapeutic drug level monitoring: Secondary | ICD-10-CM

## 2021-04-25 DIAGNOSIS — I480 Paroxysmal atrial fibrillation: Secondary | ICD-10-CM

## 2021-04-25 LAB — POCT INR: INR: 3.1 — AB (ref 2.0–3.0)

## 2021-04-25 NOTE — Patient Instructions (Signed)
Hold tonight only and then continue warfarin dose 2.5mg  daily except for 1.25mg  every Tuesday. Repeat INR in 6 weeks. Be consistent with greens.

## 2021-04-27 ENCOUNTER — Telehealth: Payer: Self-pay | Admitting: Cardiovascular Disease

## 2021-04-27 NOTE — Telephone Encounter (Signed)
*  STAT* If patient is at the pharmacy, call can be transferred to refill team.   1. Which medications need to be refilled? (please list name of each medication and dose if known) metoprolol tartrate (LOPRESSOR) 25 MG tablet  2. Which pharmacy/location (including street and city if local pharmacy) is medication to be sent to?[facility] [place], [address] AT [facility] [address] [place]   3. Do they need a 30 day or 90 day supply? [place]

## 2021-04-28 MED ORDER — METOPROLOL TARTRATE 25 MG PO TABS: 25.0000 mg | ORAL_TABLET | Freq: Every day | ORAL | 11 refills | Status: DC | PRN

## 2021-04-28 NOTE — Telephone Encounter (Signed)
Let patient know that refills has been sent to the pharmacy for her Metoprolol.

## 2021-05-03 ENCOUNTER — Ambulatory Visit: Payer: Medicare Other

## 2021-05-10 ENCOUNTER — Ambulatory Visit: Payer: Medicare Other | Attending: Family

## 2021-05-10 ENCOUNTER — Other Ambulatory Visit: Payer: Self-pay

## 2021-05-10 VITALS — BP 112/60 | HR 64 | Resp 17 | Ht 63.0 in | Wt 160.0 lb

## 2021-05-10 DIAGNOSIS — R278 Other lack of coordination: Secondary | ICD-10-CM | POA: Insufficient documentation

## 2021-05-10 DIAGNOSIS — R262 Difficulty in walking, not elsewhere classified: Secondary | ICD-10-CM | POA: Insufficient documentation

## 2021-05-10 DIAGNOSIS — R269 Unspecified abnormalities of gait and mobility: Secondary | ICD-10-CM | POA: Insufficient documentation

## 2021-05-10 DIAGNOSIS — M6281 Muscle weakness (generalized): Secondary | ICD-10-CM | POA: Insufficient documentation
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
## 2021-05-12 ENCOUNTER — Other Ambulatory Visit: Payer: Self-pay

## 2021-05-12 ENCOUNTER — Ambulatory Visit: Payer: Medicare Other

## 2021-05-12 DIAGNOSIS — R269 Unspecified abnormalities of gait and mobility: Secondary | ICD-10-CM | POA: Diagnosis not present

## 2021-05-12 DIAGNOSIS — R262 Difficulty in walking, not elsewhere classified: Secondary | ICD-10-CM

## 2021-05-12 DIAGNOSIS — M6281 Muscle weakness (generalized): Secondary | ICD-10-CM | POA: Diagnosis not present

## 2021-05-12 DIAGNOSIS — R278 Other lack of coordination: Secondary | ICD-10-CM | POA: Diagnosis not present
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
## 2021-05-12 NOTE — Addendum Note (Signed)
Addended by: Lewis Moccasin on: 05/12/2021 05:38 PM   Modules accepted: Orders

## 2021-05-13 NOTE — Telephone Encounter (Signed)
   Primary Cardiologist: Shelva Majestic, MD  Chart reviewed as part of pre-operative protocol coverage. Given past medical history and time since last visit, based on ACC/AHA guidelines, KENECIA BARREN would be at acceptable risk for the planned procedure without further cardiovascular testing.   Patient with diagnosis of afib on warfarin for anticoagulation.     Procedure: colonoscopy Date of procedure: 06/22/21   CHA2DS2-VASc Score = 6  This indicates a 9.7% annual risk of stroke. The patient's score is based upon: CHF History: Yes HTN History: Yes Diabetes History: No Stroke History: No Vascular Disease History: Yes Age Score: 2 Gender Score: 1     CrCl 76 ml/min Platelet count 222   Per office protocol, patient can hold warfarin for 5 days prior to procedure.   Patient will NOT need bridging with Lovenox (enoxaparin) around procedure.  I will route this recommendation to the requesting party via Epic fax function and remove from pre-op pool.  Please call with questions.  Jossie Ng. Johnjoseph Rolfe NP-C    05/13/2021, 1:05 PM [facility] [address] Office [phone] Fax [phone]

## 2021-05-17 DIAGNOSIS — Z20822 Contact with and (suspected) exposure to covid-19: Secondary | ICD-10-CM | POA: Diagnosis not present

## 2021-05-21 ENCOUNTER — Other Ambulatory Visit: Payer: Self-pay | Admitting: Cardiovascular Disease

## 2021-05-23 ENCOUNTER — Telehealth: Payer: Self-pay | Admitting: Cardiovascular Disease

## 2021-05-23 NOTE — Telephone Encounter (Signed)
Patient would like someone to call her to go over pre-procedure instructions for her test Wednesday 05-25-21

## 2021-05-23 NOTE — Telephone Encounter (Signed)
Returned call to patient.I spoke to [facility] scheduler and you are not scheduled on 7/13 to have any dopplers.She stated your lower ext doppler and abd aortogram will be scheduled in 11/2021.She will call or mail you a letter 1 month before to get you scheduled.Advised to ask for instructions when appointment is made.

## 2021-05-23 NOTE — Telephone Encounter (Signed)
Spoke to patient she stated she received a letter stating she is scheduled 7/13 at 9:00 am for lower ext arterial dopplers with abdominal aortogram.She was not given any instructions.Advised I do not see she has that appointment.I will check with [facility] scheduler and call you back.

## 2021-05-24 ENCOUNTER — Other Ambulatory Visit: Payer: Self-pay

## 2021-05-24 ENCOUNTER — Ambulatory Visit: Payer: Medicare Other | Attending: Family

## 2021-05-24 DIAGNOSIS — M6281 Muscle weakness (generalized): Secondary | ICD-10-CM | POA: Diagnosis not present

## 2021-05-24 DIAGNOSIS — R278 Other lack of coordination: Secondary | ICD-10-CM | POA: Insufficient documentation

## 2021-05-24 DIAGNOSIS — R262 Difficulty in walking, not elsewhere classified: Secondary | ICD-10-CM | POA: Diagnosis not present

## 2021-05-24 DIAGNOSIS — R269 Unspecified abnormalities of gait and mobility: Secondary | ICD-10-CM | POA: Diagnosis not present
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
## 2021-05-25 ENCOUNTER — Inpatient Hospital Stay (HOSPITAL_COMMUNITY): Admission: RE | Admit: 2021-05-25 | Payer: Medicare Other | Source: Ambulatory Visit

## 2021-05-26 ENCOUNTER — Other Ambulatory Visit: Payer: Self-pay

## 2021-05-26 ENCOUNTER — Ambulatory Visit: Payer: Medicare Other | Admitting: Physical Therapy

## 2021-05-26 ENCOUNTER — Encounter: Payer: Self-pay | Admitting: Physical Therapy

## 2021-05-26 ENCOUNTER — Other Ambulatory Visit: Payer: Self-pay | Admitting: Cardiovascular Disease

## 2021-05-26 DIAGNOSIS — M6281 Muscle weakness (generalized): Secondary | ICD-10-CM | POA: Diagnosis not present

## 2021-05-26 DIAGNOSIS — R262 Difficulty in walking, not elsewhere classified: Secondary | ICD-10-CM

## 2021-05-26 DIAGNOSIS — R269 Unspecified abnormalities of gait and mobility: Secondary | ICD-10-CM

## 2021-05-26 DIAGNOSIS — R278 Other lack of coordination: Secondary | ICD-10-CM | POA: Diagnosis not present
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
## 2021-05-27 ENCOUNTER — Telehealth: Payer: Self-pay | Admitting: Pulmonary Disease

## 2021-05-27 MED ORDER — ALBUTEROL SULFATE HFA 108 (90 BASE) MCG/ACT IN AERS: 2.0000 | INHALATION_SPRAY | Freq: Four times a day (QID) | RESPIRATORY_TRACT | 5 refills | Status: DC | PRN

## 2021-05-27 MED ORDER — UMECLIDINIUM-VILANTEROL 62.5-25 MCG/INH IN AEPB: 1.0000 | INHALATION_SPRAY | Freq: Every day | RESPIRATORY_TRACT | 6 refills | Status: DC

## 2021-05-27 NOTE — Telephone Encounter (Signed)
Called and spoke with patient. She stated that she needed a refill on her Anoro and albuterol. She has rarely used the albuterol and asked for it to be removed from her list back in May but she recently discovered that her albuterol was expired.   I have refilled her Anoro and albuterol.   Nothing further needed at time of call.

## 2021-05-31 ENCOUNTER — Other Ambulatory Visit: Payer: Self-pay

## 2021-05-31 ENCOUNTER — Ambulatory Visit: Payer: Medicare Other

## 2021-05-31 DIAGNOSIS — R262 Difficulty in walking, not elsewhere classified: Secondary | ICD-10-CM | POA: Diagnosis not present

## 2021-05-31 DIAGNOSIS — R269 Unspecified abnormalities of gait and mobility: Secondary | ICD-10-CM | POA: Diagnosis not present

## 2021-05-31 DIAGNOSIS — M6281 Muscle weakness (generalized): Secondary | ICD-10-CM

## 2021-05-31 DIAGNOSIS — R278 Other lack of coordination: Secondary | ICD-10-CM | POA: Diagnosis not present
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
## 2021-06-02 ENCOUNTER — Ambulatory Visit: Payer: Medicare Other | Admitting: Physical Therapy

## 2021-06-02 ENCOUNTER — Encounter: Payer: Self-pay | Admitting: Physical Therapy

## 2021-06-02 ENCOUNTER — Other Ambulatory Visit: Payer: Self-pay

## 2021-06-02 DIAGNOSIS — R269 Unspecified abnormalities of gait and mobility: Secondary | ICD-10-CM

## 2021-06-02 DIAGNOSIS — M6281 Muscle weakness (generalized): Secondary | ICD-10-CM | POA: Diagnosis not present

## 2021-06-02 DIAGNOSIS — R278 Other lack of coordination: Secondary | ICD-10-CM | POA: Diagnosis not present

## 2021-06-02 DIAGNOSIS — R262 Difficulty in walking, not elsewhere classified: Secondary | ICD-10-CM | POA: Diagnosis not present
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
## 2021-06-07 ENCOUNTER — Ambulatory Visit: Payer: Medicare Other

## 2021-06-07 ENCOUNTER — Other Ambulatory Visit: Payer: Self-pay

## 2021-06-07 DIAGNOSIS — M6281 Muscle weakness (generalized): Secondary | ICD-10-CM | POA: Diagnosis not present

## 2021-06-07 DIAGNOSIS — R278 Other lack of coordination: Secondary | ICD-10-CM | POA: Diagnosis not present

## 2021-06-07 DIAGNOSIS — R262 Difficulty in walking, not elsewhere classified: Secondary | ICD-10-CM | POA: Diagnosis not present

## 2021-06-07 DIAGNOSIS — R269 Unspecified abnormalities of gait and mobility: Secondary | ICD-10-CM | POA: Diagnosis not present
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
## 2021-06-08 ENCOUNTER — Ambulatory Visit (INDEPENDENT_AMBULATORY_CARE_PROVIDER_SITE_OTHER): Payer: Medicare Other

## 2021-06-08 DIAGNOSIS — Z5181 Encounter for therapeutic drug level monitoring: Secondary | ICD-10-CM | POA: Diagnosis not present

## 2021-06-08 DIAGNOSIS — I480 Paroxysmal atrial fibrillation: Secondary | ICD-10-CM | POA: Diagnosis not present

## 2021-06-08 LAB — POCT INR: INR: 2.5 (ref 2.0–3.0)

## 2021-06-08 NOTE — Patient Instructions (Signed)
continue warfarin dose 2.5mg  daily except for 1.25mg  every Tuesday. Repeat INR in 3 weeks. Be consistent with greens.  Hold 5 days prior to Colonoscopy 8/5-8/9

## 2021-06-09 ENCOUNTER — Encounter: Payer: Self-pay | Admitting: Physical Therapy

## 2021-06-09 ENCOUNTER — Other Ambulatory Visit: Payer: Self-pay

## 2021-06-09 ENCOUNTER — Ambulatory Visit: Payer: Medicare Other | Admitting: Physical Therapy

## 2021-06-09 DIAGNOSIS — M6281 Muscle weakness (generalized): Secondary | ICD-10-CM | POA: Diagnosis not present

## 2021-06-09 DIAGNOSIS — R278 Other lack of coordination: Secondary | ICD-10-CM | POA: Diagnosis not present

## 2021-06-09 DIAGNOSIS — R262 Difficulty in walking, not elsewhere classified: Secondary | ICD-10-CM | POA: Diagnosis not present

## 2021-06-09 DIAGNOSIS — R269 Unspecified abnormalities of gait and mobility: Secondary | ICD-10-CM | POA: Diagnosis not present
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
## 2021-06-09 NOTE — Patient Instructions (Signed)
Access Code: [email or web address] URL: [email or web address] Date: 06/09/2021 Prepared by: Blanche East  Exercises Standing Shoulder Shrugs with Dumbbells - 1 x daily - 3 x weekly - 3 sets - 10 reps - 2 hold Seated Shoulder Flexion with Dumbbells - 1 x daily - 3 x weekly - 3 sets - 10 reps - 2 hold Seated Shoulder Abduction with Dumbbells - Thumbs Up - 1 x daily - 3 x weekly - 3 sets - 10 reps - 2 hold Seated Shoulder Horizontal Abduction with Dumbbells - Thumbs Up - 1 x daily - 3 x weekly - 3 sets - 10 reps - 2 hold Seated Shoulder Row with Anchored Resistance - 1 x daily - 3 x weekly - 3 sets - 10 reps - 2 hold Seated Bicep Curls Supinated with Dumbbells - 1 x daily - 3 x weekly - 3 sets - 10 reps - 2 hold Single Arm Overhead Triceps Extension - 1 x daily - 3 x weekly - 3 sets - 10 reps - 2 hold Supine Transversus Abdominis Bracing - Hands on Stomach - 1 x daily - 7 x weekly - 2 sets - 10 reps Hooklying Sequential Leg March and Lower - 1 x daily - 7 x weekly - 3 sets - 10 reps - 2 sec hold Hooklying Single Leg March - 1 x daily - 7 x weekly - 1 sets - 10 reps - 2-5 hold Correct Standing Posture - 1 x daily - 7 x weekly - 1 sets - 1 reps - 1 min hold Wall Quarter Squat - 1 x daily - 7 x weekly - 3 sets - 10 reps Heel Raises with Counter Support - 1 x daily - 7 x weekly - 3 sets - 10 reps - 2 sec hold Side Stepping with Resistance at Ankles and Counter Support - 1 x daily - 7 x weekly - 1 sets - 5 reps

## 2021-06-10 ENCOUNTER — Telehealth: Payer: Self-pay

## 2021-06-10 NOTE — Telephone Encounter (Signed)
I spoke to the patient and she will begin holding Warfarin on 8/4-8/9.  She states that since she takes Warfarin in the evening, she believes that she needs to hold these dates instead of 5 days 8/5-8/9.

## 2021-06-13 DIAGNOSIS — I10 Essential (primary) hypertension: Secondary | ICD-10-CM | POA: Diagnosis not present

## 2021-06-13 DIAGNOSIS — E785 Hyperlipidemia, unspecified: Secondary | ICD-10-CM | POA: Diagnosis not present

## 2021-06-14 ENCOUNTER — Other Ambulatory Visit: Payer: Self-pay | Admitting: *Deleted

## 2021-06-14 ENCOUNTER — Encounter: Payer: Self-pay | Admitting: Physical Therapy

## 2021-06-14 ENCOUNTER — Other Ambulatory Visit: Payer: Self-pay

## 2021-06-14 ENCOUNTER — Ambulatory Visit: Payer: Medicare Other | Attending: Family | Admitting: Physical Therapy

## 2021-06-14 DIAGNOSIS — Z85118 Personal history of other malignant neoplasm of bronchus and lung: Secondary | ICD-10-CM

## 2021-06-14 DIAGNOSIS — R278 Other lack of coordination: Secondary | ICD-10-CM

## 2021-06-14 DIAGNOSIS — M6281 Muscle weakness (generalized): Secondary | ICD-10-CM

## 2021-06-14 DIAGNOSIS — Z08 Encounter for follow-up examination after completed treatment for malignant neoplasm: Secondary | ICD-10-CM

## 2021-06-14 DIAGNOSIS — R262 Difficulty in walking, not elsewhere classified: Secondary | ICD-10-CM

## 2021-06-14 DIAGNOSIS — R269 Unspecified abnormalities of gait and mobility: Secondary | ICD-10-CM | POA: Diagnosis not present
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
## 2021-06-16 ENCOUNTER — Other Ambulatory Visit: Payer: Self-pay

## 2021-06-16 ENCOUNTER — Encounter: Payer: Self-pay | Admitting: Physical Therapy

## 2021-06-16 ENCOUNTER — Ambulatory Visit: Payer: Medicare Other | Admitting: Physical Therapy

## 2021-06-16 DIAGNOSIS — R262 Difficulty in walking, not elsewhere classified: Secondary | ICD-10-CM

## 2021-06-16 DIAGNOSIS — R278 Other lack of coordination: Secondary | ICD-10-CM | POA: Diagnosis not present

## 2021-06-16 DIAGNOSIS — M6281 Muscle weakness (generalized): Secondary | ICD-10-CM

## 2021-06-16 DIAGNOSIS — R269 Unspecified abnormalities of gait and mobility: Secondary | ICD-10-CM | POA: Diagnosis not present
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
## 2021-06-21 ENCOUNTER — Encounter: Payer: Self-pay | Admitting: Internal Medicine

## 2021-06-21 ENCOUNTER — Ambulatory Visit: Payer: Medicare Other | Admitting: Physical Therapy

## 2021-06-22 ENCOUNTER — Ambulatory Visit
Admission: RE | Admit: 2021-06-22 | Discharge: 2021-06-22 | Disposition: A | Discharge status: Home / Self Care | Payer: Medicare Other | Attending: Internal Medicine | Admitting: Internal Medicine

## 2021-06-22 ENCOUNTER — Encounter
Admission: RE | Disposition: A | Discharge status: Home / Self Care | Payer: Self-pay | Source: Home / Self Care | Attending: Internal Medicine

## 2021-06-22 ENCOUNTER — Other Ambulatory Visit: Payer: Self-pay

## 2021-06-22 ENCOUNTER — Encounter: Payer: Self-pay | Admitting: Internal Medicine

## 2021-06-22 ENCOUNTER — Ambulatory Visit: Payer: Medicare Other | Admitting: Certified Registered Nurse Anesthetist

## 2021-06-22 DIAGNOSIS — Z800 Family history of malignant neoplasm of digestive organs: Secondary | ICD-10-CM | POA: Insufficient documentation

## 2021-06-22 DIAGNOSIS — J449 Chronic obstructive pulmonary disease, unspecified: Secondary | ICD-10-CM | POA: Insufficient documentation

## 2021-06-22 DIAGNOSIS — Z1211 Encounter for screening for malignant neoplasm of colon: Secondary | ICD-10-CM | POA: Insufficient documentation

## 2021-06-22 DIAGNOSIS — K648 Other hemorrhoids: Secondary | ICD-10-CM | POA: Diagnosis not present

## 2021-06-22 DIAGNOSIS — Z86010 Personal history of colonic polyps: Secondary | ICD-10-CM | POA: Insufficient documentation

## 2021-06-22 DIAGNOSIS — Z881 Allergy status to other antibiotic agents status: Secondary | ICD-10-CM | POA: Insufficient documentation

## 2021-06-22 DIAGNOSIS — Z91040 Latex allergy status: Secondary | ICD-10-CM | POA: Diagnosis not present

## 2021-06-22 DIAGNOSIS — Z79899 Other long term (current) drug therapy: Secondary | ICD-10-CM | POA: Insufficient documentation

## 2021-06-22 DIAGNOSIS — Z888 Allergy status to other drugs, medicaments and biological substances status: Secondary | ICD-10-CM | POA: Diagnosis not present

## 2021-06-22 DIAGNOSIS — K5730 Diverticulosis of large intestine without perforation or abscess without bleeding: Secondary | ICD-10-CM | POA: Insufficient documentation

## 2021-06-22 DIAGNOSIS — K5900 Constipation, unspecified: Secondary | ICD-10-CM | POA: Diagnosis not present

## 2021-06-22 DIAGNOSIS — Z882 Allergy status to sulfonamides status: Secondary | ICD-10-CM | POA: Insufficient documentation

## 2021-06-22 DIAGNOSIS — K640 First degree hemorrhoids: Secondary | ICD-10-CM | POA: Diagnosis not present

## 2021-06-22 DIAGNOSIS — Z85118 Personal history of other malignant neoplasm of bronchus and lung: Secondary | ICD-10-CM | POA: Insufficient documentation

## 2021-06-22 DIAGNOSIS — E7800 Pure hypercholesterolemia, unspecified: Secondary | ICD-10-CM | POA: Diagnosis not present

## 2021-06-22 DIAGNOSIS — Z980 Intestinal bypass and anastomosis status: Secondary | ICD-10-CM | POA: Insufficient documentation

## 2021-06-22 HISTORY — DX: Personal history of colon polyps, unspecified: Z86.0100

## 2021-06-22 HISTORY — PX: COLONOSCOPY WITH PROPOFOL: SHX5780

## 2021-06-22 HISTORY — DX: Personal history of colonic polyps: Z86.010

## 2021-06-22 HISTORY — DX: Raynaud's syndrome without gangrene: I73.00

## 2021-06-22 HISTORY — DX: Family history of malignant neoplasm of digestive organs: Z80.0

## 2021-06-22 SURGERY — COLONOSCOPY WITH PROPOFOL
Anesthesia: General

## 2021-06-22 MED ORDER — SODIUM CHLORIDE 0.9 % IV SOLN: INTRAVENOUS | Status: DC

## 2021-06-22 MED ORDER — PROPOFOL 500 MG/50ML IV EMUL
INTRAVENOUS | Status: DC | PRN
  Administered 2021-06-22: 100 ug/kg/min via INTRAVENOUS

## 2021-06-22 MED ORDER — LIDOCAINE HCL (CARDIAC) PF 100 MG/5ML IV SOSY
PREFILLED_SYRINGE | INTRAVENOUS | Status: DC | PRN
  Administered 2021-06-22: 50 mg via INTRAVENOUS

## 2021-06-22 MED ORDER — PROPOFOL 10 MG/ML IV BOLUS
INTRAVENOUS | Status: DC | PRN
  Administered 2021-06-22: 100 mg via INTRAVENOUS

## 2021-06-22 NOTE — Interval H&P Note (Signed)
History and Physical Interval Note:  06/22/2021 2:32 PM  [name]  has presented today for surgery, with the diagnosis of FM HX CC.  The various methods of treatment have been discussed with the patient and family. After consideration of risks, benefits and other options for treatment, the patient has consented to  Procedure(s): COLONOSCOPY WITH PROPOFOL (N/A) as a surgical intervention.  The patient's history has been reviewed, patient examined, no change in status, stable for surgery.  I have reviewed the patient's chart and labs.  Questions were answered to the patient's satisfaction.     [place], [place]
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
## 2021-06-22 NOTE — Transfer of Care (Signed)
Immediate Anesthesia Transfer of Care Note  Patient: [name]  Procedure(s) Performed: COLONOSCOPY WITH PROPOFOL  Patient Location: PACU  Anesthesia Type:General  Level of Consciousness: drowsy  Airway & Oxygen Therapy: Patient Spontanous Breathing  Post-op Assessment: Report given to RN and Post -op Vital signs reviewed and stable  Post vital signs: Reviewed and stable  Last Vitals:  Vitals Value Taken Time  BP 94/50 06/22/21 1531  Temp    Pulse 55 06/22/21 1532  Resp 19 06/22/21 1532  SpO2 100 % 06/22/21 1532  Vitals shown include unvalidated device data.  Last Pain:  Vitals:   06/22/21 1422  TempSrc: Temporal  PainSc: 3          Complications: No notable events documented.

## 2021-06-22 NOTE — Op Note (Signed)
[facility] Gastroenterology Patient Name: [name] Procedure Date: 06/22/2021 3:05 PM MRN: [id number] Account #: [id number] Date of Birth: Sep 30, 1946 Admit Type: Outpatient Age: 75 Room: [facility] ENDO ROOM 2 Gender: Female Note Status: Finalized Procedure:             Colonoscopy Indications:           Screening in patient at increased risk: Family history                         of 1st-degree relative with colorectal cancer Providers:             Benay Pike. Airabella Barley MD, MD Medicines:             Propofol per Anesthesia Complications:         No immediate complications. Procedure:             Pre-Anesthesia Assessment:                        - The risks and benefits of the procedure and the                         sedation options and risks were discussed with the                         patient. All questions were answered and informed                         consent was obtained.                        - Patient identification and proposed procedure were                         verified prior to the procedure by the nurse. The                         procedure was verified in the procedure room.                        - ASA Grade Assessment: III - A patient with severe                         systemic disease.                        - After reviewing the risks and benefits, the patient                         was deemed in satisfactory condition to undergo the                         procedure.                        After obtaining informed consent, the colonoscope was                         passed under direct vision. Throughout the procedure,  the patient's blood pressure, pulse, and oxygen                         saturations were monitored continuously. The                         Colonoscope was introduced through the anus and                         advanced to the the cecum, identified by appendiceal                          orifice and ileocecal valve. The colonoscopy was                         performed without difficulty. The patient tolerated                         the procedure well. The quality of the bowel                         preparation was good. The ileocecal valve, appendiceal                         orifice, and rectum were photographed. Findings:      The perianal and digital rectal examinations were normal. Pertinent       negatives include normal sphincter tone and no palpable rectal lesions.      Non-bleeding internal hemorrhoids were found during retroflexion. The       hemorrhoids were Grade I (internal hemorrhoids that do not prolapse).      Many small and large-mouthed diverticula were found in the entire colon.      There was evidence of a prior end-to-side colo-colonic anastomosis in       the sigmoid colon. This was patent and was characterized by healthy       appearing mucosa. The anastomosis was traversed.      The exam was otherwise without abnormality. Impression:            - Non-bleeding internal hemorrhoids.                        - Diverticulosis in the entire examined colon.                        - Patent end-to-side colo-colonic anastomosis,                         characterized by healthy appearing mucosa.                        - The examination was otherwise normal.                        - No specimens collected. Recommendation:        - Patient has a contact number available for                         emergencies. The signs and symptoms of potential  delayed complications were discussed with the patient.                         Return to normal activities tomorrow. Written                         discharge instructions were provided to the patient.                        - Resume previous diet.                        - Continue present medications.                        - No repeat colonoscopy due to current age (24 years                          or older) and the absence of colonic polyps.                        - You do NOT require further colon cancer screening                         measures (Annual stool testing (i.e. hemoccult, FIT,                         cologuard), sigmoidoscopy, colonoscopy or CT                         colonography). You should share this recommendation                         with your Primary Care provider.                        - Return to GI office PRN.                        - The findings and recommendations were discussed with                         the patient. Procedure Code(s):     --- Professional ---                        J1941, Colorectal cancer screening; colonoscopy on                         individual at high risk Diagnosis Code(s):     --- Professional ---                        K57.30, Diverticulosis of large intestine without                         perforation or abscess without bleeding                        Z98.0, Intestinal bypass and anastomosis status  K64.0, First degree hemorrhoids                        Z80.0, Family history of malignant neoplasm of                         digestive organs CPT copyright 2019 [employer]. All rights reserved. The codes documented in this report are preliminary and upon coder review may  be revised to meet current compliance requirements. Efrain Sella MD, MD 06/22/2021 3:31:23 PM This report has been signed electronically. Number of Addenda: 0 Note Initiated On: 06/22/2021 3:05 PM Scope Withdrawal Time: 0 hours 2 minutes 11 seconds  Total Procedure Duration: 0 hours 5 minutes 53 seconds  Estimated Blood Loss:  Estimated blood loss: none.      [facility]
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
## 2021-06-23 ENCOUNTER — Encounter: Payer: Self-pay | Admitting: Internal Medicine

## 2021-06-23 ENCOUNTER — Ambulatory Visit: Payer: Medicare Other

## 2021-06-27 ENCOUNTER — Other Ambulatory Visit: Payer: Self-pay

## 2021-06-27 ENCOUNTER — Ambulatory Visit (INDEPENDENT_AMBULATORY_CARE_PROVIDER_SITE_OTHER): Payer: Medicare Other

## 2021-06-27 DIAGNOSIS — I480 Paroxysmal atrial fibrillation: Secondary | ICD-10-CM | POA: Diagnosis not present

## 2021-06-27 DIAGNOSIS — Z5181 Encounter for therapeutic drug level monitoring: Secondary | ICD-10-CM | POA: Diagnosis not present

## 2021-06-27 LAB — POCT INR: INR: 1.7 — AB (ref 2.0–3.0)

## 2021-06-27 NOTE — Patient Instructions (Signed)
Take 1.5 tablets tonight only and then continue warfarin dose 2.5mg  daily except for 1.25mg  every Tuesday. Repeat INR in 2 weeks.

## 2021-06-27 NOTE — Anesthesia Postprocedure Evaluation (Signed)
Anesthesia Post Note  Patient: [name]  Procedure(s) Performed: COLONOSCOPY WITH PROPOFOL  Patient location during evaluation: PACU Anesthesia Type: General Level of consciousness: awake and alert Pain management: pain level controlled Vital Signs Assessment: post-procedure vital signs reviewed and stable Respiratory status: spontaneous breathing, nonlabored ventilation, respiratory function stable and patient connected to nasal cannula oxygen Cardiovascular status: blood pressure returned to baseline and stable Postop Assessment: no apparent nausea or vomiting Anesthetic complications: no   No notable events documented.   Last Vitals:  Vitals:   06/22/21 1540 06/22/21 1550  BP: 126/61 (!) 148/68  Pulse:    Resp:    Temp:    SpO2:      Last Pain:  Vitals:   06/23/21 0717  TempSrc:   PainSc: 0-No pain                 Molli Barrows

## 2021-06-28 ENCOUNTER — Ambulatory Visit: Payer: Medicare Other | Admitting: Physical Therapy

## 2021-06-29 ENCOUNTER — Telehealth: Payer: Self-pay | Admitting: Internal Medicine

## 2021-06-29 ENCOUNTER — Telehealth: Payer: Self-pay | Admitting: Cardiovascular Disease

## 2021-06-29 NOTE — Telephone Encounter (Signed)
Pt c/o medication issue:  1. Name of Medication:  Metoprolol Succinate 25 MG's  2. How are you currently taking this medication (dosage and times per day)? 1 tablet daily  3. Are you having a reaction (difficulty breathing--STAT)? No   4. What is your medication issue? Patient is requesting this medication be added to her current medication list due to seeing it is not listed on my chart. States she takes this daily and has for years.

## 2021-06-29 NOTE — Telephone Encounter (Signed)
   Pt is requesting to speak with Dr. Olin Pia nurse. She just said she needs to talk to her

## 2021-06-29 NOTE — Telephone Encounter (Signed)
Left message to call back   Per chart review, metoprolol succinate has not been on med list since 03/30/21 when stopped by Dr. Caryl Comes   Dr. Claiborne Billings note from 04/18/21 "She saw Dr. Caryl Comes on Mar 30, 2021.  At that time she was still having blood pressure issues.  He recommended she discontinue metoprolol succinate and initiate carvedilol 6.25 mg twice a day.  I gave her a prescription for hydralazine 10 mg to take as needed 1 tablet every 8 hours depending upon blood pressure.  Her ECG showed sinus rhythm at 67 bpm.  She is being followed at [facility] hematology for CLL and sees Dr. Pennelope Bracken.   She has not tolerated amlodipine, Cardizem, carvedilol, spironolactone, and hydralazine.  With her inability to tolerate carvedilol, she resumed taking metoprolol succinate 25 mg twice a day. "

## 2021-06-29 NOTE — Telephone Encounter (Signed)
Spoke with the patient who states that she needs few things updated in her chart. She states that she is taking metoprolol succinate 12.5 mg twice daily. She states that Dr. Caryl Comes stopped this medication and started her on carvedilol. She was unable to tolerate carvedilol so she went back on metoprolol. She also reports that her allergic reaction to amiodarone is wrong. She requests that it be updated to specify that her tongue and throat became swollen. Patient's medications and allergies have been updated.

## 2021-06-30 ENCOUNTER — Other Ambulatory Visit: Payer: Self-pay

## 2021-06-30 ENCOUNTER — Ambulatory Visit: Payer: Medicare Other

## 2021-06-30 DIAGNOSIS — M6281 Muscle weakness (generalized): Secondary | ICD-10-CM | POA: Diagnosis not present

## 2021-06-30 DIAGNOSIS — R262 Difficulty in walking, not elsewhere classified: Secondary | ICD-10-CM | POA: Diagnosis not present

## 2021-06-30 DIAGNOSIS — R8290 Unspecified abnormal findings in urine: Secondary | ICD-10-CM | POA: Diagnosis not present

## 2021-06-30 DIAGNOSIS — R278 Other lack of coordination: Secondary | ICD-10-CM | POA: Diagnosis not present

## 2021-06-30 DIAGNOSIS — R269 Unspecified abnormalities of gait and mobility: Secondary | ICD-10-CM | POA: Diagnosis not present
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
## 2021-07-01 NOTE — Telephone Encounter (Signed)
Patient spoke w/triage nurse from Dr. Olin Pia office on 06/29/21 and addressed same issue.

## 2021-07-04 ENCOUNTER — Other Ambulatory Visit: Payer: Self-pay | Admitting: Cardiovascular Disease

## 2021-07-05 ENCOUNTER — Ambulatory Visit: Payer: Medicare Other | Admitting: Physical Therapy

## 2021-07-05 DIAGNOSIS — Z961 Presence of intraocular lens: Secondary | ICD-10-CM | POA: Diagnosis not present

## 2021-07-05 DIAGNOSIS — M3501 Sicca syndrome with keratoconjunctivitis: Secondary | ICD-10-CM | POA: Diagnosis not present

## 2021-07-06 ENCOUNTER — Other Ambulatory Visit: Payer: Self-pay | Admitting: Cardiovascular Disease

## 2021-07-07 ENCOUNTER — Ambulatory Visit: Payer: Medicare Other | Admitting: Physical Therapy

## 2021-07-07 ENCOUNTER — Other Ambulatory Visit: Payer: Self-pay

## 2021-07-07 ENCOUNTER — Encounter: Payer: Self-pay | Admitting: Physical Therapy

## 2021-07-07 DIAGNOSIS — R269 Unspecified abnormalities of gait and mobility: Secondary | ICD-10-CM

## 2021-07-07 DIAGNOSIS — M6281 Muscle weakness (generalized): Secondary | ICD-10-CM

## 2021-07-07 DIAGNOSIS — R262 Difficulty in walking, not elsewhere classified: Secondary | ICD-10-CM

## 2021-07-07 DIAGNOSIS — R278 Other lack of coordination: Secondary | ICD-10-CM | POA: Diagnosis not present
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
## 2021-07-07 NOTE — Patient Instructions (Signed)
Access Code: [email or web address] URL: [email or web address] Date: 07/07/2021 Prepared by: Blanche East  Exercises Standing Shoulder Shrugs with Dumbbells - 1 x daily - 3 x weekly - 3 sets - 10 reps - 2 hold Seated Shoulder Flexion with Dumbbells - 1 x daily - 3 x weekly - 3 sets - 10 reps - 2 hold Seated Shoulder Abduction with Dumbbells - Thumbs Up - 1 x daily - 3 x weekly - 3 sets - 10 reps - 2 hold Seated Shoulder Horizontal Abduction with Dumbbells - Thumbs Up - 1 x daily - 3 x weekly - 3 sets - 10 reps - 2 hold Seated Shoulder Row with Anchored Resistance - 1 x daily - 3 x weekly - 3 sets - 10 reps - 2 hold Seated Bicep Curls Supinated with Dumbbells - 1 x daily - 3 x weekly - 3 sets - 10 reps - 2 hold Single Arm Overhead Triceps Extension - 1 x daily - 3 x weekly - 3 sets - 10 reps - 2 hold Correct Standing Posture - 1 x daily - 7 x weekly - 1 sets - 1 reps - 1 min hold Wall Quarter Squat - 1 x daily - 7 x weekly - 3 sets - 10 reps Heel Raises with Counter Support - 1 x daily - 7 x weekly - 3 sets - 10 reps - 2 sec hold Side Stepping with Resistance at Ankles and Counter Support - 1 x daily - 7 x weekly - 1 sets - 5 reps Standing Abdominal Wall Shortening and Diastasis Recti Correction with Box Lift - 1 x daily - 4 x weekly - 1 sets - 10 reps - 5 sec hold Standing March with Counter Support - 1 x daily - 4 x weekly - 1 sets - 10 reps

## 2021-07-11 ENCOUNTER — Telehealth: Payer: Self-pay | Admitting: Cardiovascular Disease

## 2021-07-11 NOTE — Telephone Encounter (Signed)
   Pt is requesting to switch from Dr. Claiborne Billings to Dr. Phineas Inches, she requested a female doctor.

## 2021-07-12 ENCOUNTER — Ambulatory Visit: Payer: Medicare Other | Admitting: Physical Therapy

## 2021-07-13 ENCOUNTER — Other Ambulatory Visit: Payer: Self-pay

## 2021-07-13 ENCOUNTER — Ambulatory Visit (INDEPENDENT_AMBULATORY_CARE_PROVIDER_SITE_OTHER): Payer: Medicare Other

## 2021-07-13 DIAGNOSIS — I480 Paroxysmal atrial fibrillation: Secondary | ICD-10-CM | POA: Diagnosis not present

## 2021-07-13 DIAGNOSIS — Z5181 Encounter for therapeutic drug level monitoring: Secondary | ICD-10-CM

## 2021-07-13 LAB — POCT INR: INR: 2.8 (ref 2.0–3.0)

## 2021-07-13 NOTE — Patient Instructions (Signed)
continue warfarin dose 2.5mg  daily except for 1.25mg  every Tuesday. Repeat INR in 6 weeks.  Eat greens tonight or tomorrow.

## 2021-07-14 ENCOUNTER — Encounter: Payer: Self-pay | Admitting: Physical Therapy

## 2021-07-14 ENCOUNTER — Ambulatory Visit: Payer: Medicare Other | Attending: Family | Admitting: Physical Therapy

## 2021-07-14 DIAGNOSIS — M6281 Muscle weakness (generalized): Secondary | ICD-10-CM | POA: Insufficient documentation

## 2021-07-14 DIAGNOSIS — R262 Difficulty in walking, not elsewhere classified: Secondary | ICD-10-CM | POA: Diagnosis not present

## 2021-07-14 DIAGNOSIS — R278 Other lack of coordination: Secondary | ICD-10-CM | POA: Diagnosis not present

## 2021-07-14 DIAGNOSIS — R269 Unspecified abnormalities of gait and mobility: Secondary | ICD-10-CM | POA: Diagnosis not present
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
## 2021-07-14 NOTE — Telephone Encounter (Signed)
ok 

## 2021-07-14 NOTE — Patient Instructions (Addendum)
Access Code: [email or web address] URL: [email or web address] Date: 07/14/2021 Prepared by: Blanche East  Exercises Seated Upper Trapezius Stretch - 1 x daily - 3 x weekly - 1 sets - 3 reps - 20-30 sec hold Seated Cervical Rotation AROM - 1 x daily - 3 x weekly - 1 sets - 10 reps Standing Shoulder Shrugs with Dumbbells - 1 x daily - 3 x weekly - 2 sets - 10 reps - 2 hold Seated Shoulder Flexion with Dumbbells - 1 x daily - 3 x weekly - 2 sets - 10 reps - 2 hold Seated Shoulder Abduction with Dumbbells - Thumbs Up - 1 x daily - 3 x weekly - 2 sets - 10 reps - 2 hold Seated Shoulder Horizontal Abduction with Dumbbells - Thumbs Up - 1 x daily - 3 x weekly - 2 sets - 10 reps - 2 hold Seated Shoulder Row with Anchored Resistance - 1 x daily - 3 x weekly - 2 sets - 10 reps - 2 hold Seated Bicep Curls Supinated with Dumbbells - 1 x daily - 3 x weekly - 2 sets - 10 reps - 2 hold Single Arm Overhead Triceps Extension - 1 x daily - 3 x weekly - 2 sets - 10 reps - 2 hold Correct Standing Posture - 1 x daily - 4 x weekly - 1 sets - 1 reps - 1 min hold Heel Raises with Counter Support - 1 x daily - 4 x weekly - 2 sets - 10 reps - 2 sec hold Side Stepping with Resistance at Ankles and Counter Support - 1 x daily - 4 x weekly - 1 sets - 5 reps Standing Abdominal Wall Shortening and Diastasis Recti Correction with Box Lift - 1 x daily - 4 x weekly - 1 sets - 10 reps - 5 sec hold Standing March with Counter Support - 1 x daily - 4 x weekly - 2 sets - 10 reps Forward Step Up with Counter Support - 1 x daily - 7 x weekly - 1 sets - 10 reps Seated Passive Cervical Retraction - 1 x daily - 4 x weekly - 1 sets - 5-10 reps - 5 sec hold

## 2021-07-17 ENCOUNTER — Other Ambulatory Visit: Payer: Self-pay | Admitting: Cardiovascular Disease

## 2021-07-19 ENCOUNTER — Ambulatory Visit: Payer: Medicare Other

## 2021-07-20 ENCOUNTER — Other Ambulatory Visit: Payer: Self-pay

## 2021-07-20 ENCOUNTER — Ambulatory Visit (INDEPENDENT_AMBULATORY_CARE_PROVIDER_SITE_OTHER): Payer: Medicare Other | Admitting: Physician Assistant

## 2021-07-20 ENCOUNTER — Ambulatory Visit
Admission: RE | Admit: 2021-07-20 | Discharge: 2021-07-20 | Disposition: A | Discharge status: Home / Self Care | Payer: Medicare Other | Source: Ambulatory Visit | Attending: Surgery | Admitting: Surgery

## 2021-07-20 VITALS — BP 145/75 | HR 69 | Resp 20 | Ht 63.0 in | Wt 159.0 lb

## 2021-07-20 DIAGNOSIS — J90 Pleural effusion, not elsewhere classified: Secondary | ICD-10-CM | POA: Diagnosis not present

## 2021-07-20 DIAGNOSIS — Z08 Encounter for follow-up examination after completed treatment for malignant neoplasm: Secondary | ICD-10-CM | POA: Diagnosis not present

## 2021-07-20 DIAGNOSIS — I700 Atherosclerosis of aorta: Secondary | ICD-10-CM | POA: Diagnosis not present

## 2021-07-20 DIAGNOSIS — Z85118 Personal history of other malignant neoplasm of bronchus and lung: Secondary | ICD-10-CM | POA: Diagnosis not present

## 2021-07-20 DIAGNOSIS — I252 Old myocardial infarction: Secondary | ICD-10-CM

## 2021-07-20 DIAGNOSIS — J439 Emphysema, unspecified: Secondary | ICD-10-CM | POA: Diagnosis not present

## 2021-07-20 DIAGNOSIS — C3490 Malignant neoplasm of unspecified part of unspecified bronchus or lung: Secondary | ICD-10-CM | POA: Diagnosis not present

## 2021-07-20 DIAGNOSIS — I2510 Atherosclerotic heart disease of native coronary artery without angina pectoris: Secondary | ICD-10-CM

## 2021-07-20 NOTE — Progress Notes (Signed)
HPI:  The patient is a 75 year old woman who underwent left muscle-sparing thoracotomy and left lower lobectomy on 07/02/2017 for a T1, N0, N0 squamous cell carcinoma of the lung.  She had lymphovascular invasion but negative resection margins. She has CLL but it is early stage and does not require any treatment. When I saw her in February 2021 a CT of the chest showed some enlargement of the left axillary lymph nodes with the most prominent one measuring 1.3 cm.  We decided to do a PET scan to evaluate that further and it showed mild metabolic activity associated with the left axillary lymphadenopathy but less than the background blood pool activity.  The previously seen 12 mm lymph node had decreased in size to 10 mm.  It was felt that most likely this lymphadenopathy was related to her recent Moderna COVID-19 vaccine given in the left arm.  She continues to do fairly well overall.  She has chronic shortness of breath related to COPD that is fairly stable.  Emergency CABG in 2008 with Dr. [name], this was when she quit smoking.     Current Outpatient Medications  Medication Sig Dispense Refill   acetaminophen (TYLENOL) 500 MG tablet Take 1,000 mg by mouth every 8 (eight) hours as needed (pain.).      amLODipine (NORVASC) 2.5 MG tablet TAKE 1 TABLET BY MOUTH EVERY DAY 30 tablet 6   Calcium Carb-Cholecalciferol (CALTRATE 600+D3 PO) Take 1 tablet by mouth daily at 12 noon.     Carboxymethylcellulose Sodium (THERATEARS) 0.25 % SOLN      Cholecalciferol (CVS D3) 25 MCG (1000 UT) capsule      docusate sodium (COLACE) 100 MG capsule Take 200 mg by mouth every evening.      hydrocortisone valerate cream (WESTCORT) 0.2 % Apply 1 application topically 3 (three) times a week. On random days - for eczema in ear     isosorbide mononitrate (IMDUR) 30 MG 24 hr tablet TAKE 1 TABLET(30 MG) BY MOUTH TWICE DAILY 180 tablet 3   lisinopril (ZESTRIL) 20 MG tablet Take 20 mg by mouth in the morning and at  bedtime.     metoprolol succinate (TOPROL-XL) 25 MG 24 hr tablet TAKE 1 TABLET(25 MG) BY MOUTH DAILY 90 tablet 1   metoprolol tartrate (LOPRESSOR) 25 MG tablet Take 25 mg by mouth daily as needed (afib).     nitroGLYCERIN (NITROSTAT) 0.4 MG SL tablet Place 1 tablet (0.4 mg total) under the tongue every 5 (five) minutes x 3 doses as needed for chest pain. 25 tablet 1   polyethylene glycol (MIRALAX / GLYCOLAX) 17 g packet      pyridOXINE (VITAMIN B-6) 100 MG tablet Take 100 mg by mouth daily at 12 noon.     rosuvastatin (CRESTOR) 20 MG tablet Take 1 tablet (20 mg total) by mouth daily. 90 tablet 1   umeclidinium-vilanterol (ANORO ELLIPTA) 62.5-25 MCG/INH AEPB Inhale 1 puff into the lungs daily. 1 each 6   valACYclovir (VALTREX) 1000 MG tablet Take 2,000 mg by mouth daily as needed (fever blisters/cold sores.).      vitamin B-12 (CYANOCOBALAMIN) 500 MCG tablet Take 1,000 mcg by mouth daily at 12 noon.     warfarin (COUMADIN) 2.5 MG tablet TAKE 1/2 TO 1 TABLET BY MOUTH DAILY AS DIRECTED 90 tablet 1   amLODipine (NORVASC) 5 MG tablet Take 1 tablet (5 mg total) by mouth daily. 90 tablet 1   ezetimibe (ZETIA) 10 MG tablet Take 1 tablet (10  mg total) by mouth daily. 90 tablet 3   No current facility-administered medications for this visit.     Physical Exam: Vitals:   07/20/21 1148  BP: (!) 145/75  Pulse: 69  Resp: 20  SpO2: 94%    She looks well. There is no cervical or supraclavicular adenopathy. Cardiac exam shows a regular rate and rhythm with normal heart sounds. Lung exam is clear.  Diagnostic Tests:  CLINICAL DATA:  Lung cancer surveillance.  History of leukemia.   EXAM: CT CHEST WITHOUT CONTRAST   TECHNIQUE: Multidetector CT imaging of the chest was performed following the standard protocol without IV contrast.   COMPARISON:  01/19/2021.   FINDINGS: Cardiovascular: Atherosclerotic calcification of the aorta and aortic valve. Heart is at the upper limits of normal in  size. No pericardial effusion.   Mediastinum/Nodes: Subcentimeter low-attenuation lesion in the right thyroid. No follow-up recommended. (Ref: J Am Coll Radiol. 2015 Feb;12(2): 143-50).Mediastinal and axillary lymph nodes are not enlarged by CT size criteria. Hilar regions are difficult to definitively evaluate without IV contrast. Esophagus is grossly unremarkable.   Lungs/Pleura: Centrilobular and paraseptal emphysema. Left lower lobectomy. Similar small left pleural effusion with pleuroparenchymal scarring in the lower left hemithorax. Lungs are otherwise clear. Airway is otherwise unremarkable.   Upper Abdomen: Subcentimeter low-attenuation lesion in the left hepatic lobe is too small to characterize but is unchanged and is likely a cyst. Visualized portions of the liver, gallbladder, adrenal glands, kidneys, spleen, pancreas, stomach and bowel are otherwise unremarkable with the exception of a small hiatal hernia.   Musculoskeletal: Degenerative changes in the spine. Scattered thoracic compression deformities are unchanged. Old left rib fracture or thoracotomy defect. There may be an old right first rib fracture. No worrisome lytic or sclerotic lesions.   IMPRESSION: 1. No evidence of recurrent or metastatic disease. 2. Small left pleural effusion, unchanged. 3.  Aortic atherosclerosis (ICD10-I70.0). 4.  Emphysema (ICD10-J43.9).     Electronically Signed   By: Lorin Picket M.D.   On: 07/20/2021 11:31  Narrative & Impression  CLINICAL DATA:  Lung cancer.   EXAM: CT CHEST WITHOUT CONTRAST   TECHNIQUE: Multidetector CT imaging of the chest was performed following the standard protocol without IV contrast.   COMPARISON:  07/21/2020.   FINDINGS: Cardiovascular: Atherosclerotic calcification of the aorta and aortic valve. Heart is at the upper limits of normal in size. No pericardial effusion.   Mediastinum/Nodes: No pathologically enlarged mediastinal  or axillary lymph nodes. Hilar regions are difficult to definitively evaluate without IV contrast. Esophagus is grossly unremarkable.   Lungs/Pleura: Left lower lobectomy. Centrilobular and paraseptal emphysema. Pulmonary nodules measure 3 mm or less in size, unchanged. Lungs are otherwise clear. Small left pleural effusion, unchanged. Airway is otherwise unremarkable.   Upper Abdomen: Subcentimeter low-attenuation lesion in the left hepatic lobe is too small to characterize but unchanged and likely a cyst. Visualized portions of the liver, gallbladder, adrenal glands, kidneys, spleen, pancreas, stomach and bowel are otherwise unremarkable with the exception of a small hiatal hernia.   Musculoskeletal: Scattered compression deformities are unchanged. Thoracotomy changes on the left.   IMPRESSION: 1. No evidence of recurrent or metastatic disease. 2. Chronic small left pleural effusion, stable. 3.  Aortic atherosclerosis (ICD10-I70.0). 4.  Emphysema (ICD10-J43.9).     Electronically Signed   By: Lorin Picket M.D.   On: 01/19/2021 13:18       Impression:  She is doing well 4 years following her surgery.  CT scan of the chest  shows no evidence of recurrent or metastatic lung cancer.  She has a chronic small left pleural effusion that is unchanged.  The other tiny pulmonary nodules are unchanged.  I reviewed the CT images with her and answered her questions.  I recommend that she have a follow-up CT in 6 months.    Plan:  She will return to see me in 6 months with a CT scan of the chest.  I spent 20 minutes performing this established patient evaluation and > 50% of this time was spent face to face counseling and coordinating the surveillance of her previously resected lung cancer.    Nicholes Rough, PA-C [facility] [phone]

## 2021-07-21 ENCOUNTER — Ambulatory Visit: Payer: Medicare Other

## 2021-07-21 DIAGNOSIS — R262 Difficulty in walking, not elsewhere classified: Secondary | ICD-10-CM | POA: Diagnosis not present

## 2021-07-21 DIAGNOSIS — R269 Unspecified abnormalities of gait and mobility: Secondary | ICD-10-CM | POA: Diagnosis not present

## 2021-07-21 DIAGNOSIS — M6281 Muscle weakness (generalized): Secondary | ICD-10-CM

## 2021-07-21 DIAGNOSIS — R278 Other lack of coordination: Secondary | ICD-10-CM | POA: Diagnosis not present
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
## 2021-07-26 ENCOUNTER — Ambulatory Visit: Payer: Medicare Other

## 2021-07-28 ENCOUNTER — Other Ambulatory Visit: Payer: Self-pay

## 2021-07-28 ENCOUNTER — Ambulatory Visit: Payer: Medicare Other

## 2021-07-28 DIAGNOSIS — M6281 Muscle weakness (generalized): Secondary | ICD-10-CM

## 2021-07-28 DIAGNOSIS — R262 Difficulty in walking, not elsewhere classified: Secondary | ICD-10-CM | POA: Diagnosis not present

## 2021-07-28 DIAGNOSIS — R278 Other lack of coordination: Secondary | ICD-10-CM | POA: Diagnosis not present

## 2021-07-28 DIAGNOSIS — R269 Unspecified abnormalities of gait and mobility: Secondary | ICD-10-CM | POA: Diagnosis not present
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
## 2021-08-02 ENCOUNTER — Ambulatory Visit: Payer: Medicare Other

## 2021-08-04 ENCOUNTER — Ambulatory Visit: Payer: Medicare Other

## 2021-08-09 ENCOUNTER — Ambulatory Visit: Payer: Medicare Other

## 2021-08-11 ENCOUNTER — Ambulatory Visit: Payer: Medicare Other

## 2021-08-16 DIAGNOSIS — I350 Nonrheumatic aortic (valve) stenosis: Secondary | ICD-10-CM | POA: Diagnosis not present

## 2021-08-16 DIAGNOSIS — Z23 Encounter for immunization: Secondary | ICD-10-CM | POA: Diagnosis not present

## 2021-08-16 DIAGNOSIS — Z9049 Acquired absence of other specified parts of digestive tract: Secondary | ICD-10-CM | POA: Diagnosis not present

## 2021-08-16 DIAGNOSIS — R5383 Other fatigue: Secondary | ICD-10-CM | POA: Diagnosis not present

## 2021-08-16 DIAGNOSIS — I7300 Raynaud's syndrome without gangrene: Secondary | ICD-10-CM | POA: Diagnosis not present

## 2021-08-16 DIAGNOSIS — M3500 Sicca syndrome, unspecified: Secondary | ICD-10-CM | POA: Diagnosis not present

## 2021-08-16 DIAGNOSIS — J449 Chronic obstructive pulmonary disease, unspecified: Secondary | ICD-10-CM | POA: Diagnosis not present

## 2021-08-16 DIAGNOSIS — I272 Pulmonary hypertension, unspecified: Secondary | ICD-10-CM | POA: Diagnosis not present

## 2021-08-16 DIAGNOSIS — Z87891 Personal history of nicotine dependence: Secondary | ICD-10-CM | POA: Diagnosis not present

## 2021-08-16 DIAGNOSIS — I2510 Atherosclerotic heart disease of native coronary artery without angina pectoris: Secondary | ICD-10-CM | POA: Diagnosis not present

## 2021-08-16 DIAGNOSIS — C9110 Chronic lymphocytic leukemia of B-cell type not having achieved remission: Secondary | ICD-10-CM | POA: Diagnosis not present

## 2021-08-16 DIAGNOSIS — E785 Hyperlipidemia, unspecified: Secondary | ICD-10-CM | POA: Diagnosis not present

## 2021-08-16 DIAGNOSIS — I480 Paroxysmal atrial fibrillation: Secondary | ICD-10-CM | POA: Diagnosis not present

## 2021-08-23 ENCOUNTER — Ambulatory Visit (INDEPENDENT_AMBULATORY_CARE_PROVIDER_SITE_OTHER): Payer: Medicare Other | Admitting: Internal Medicine

## 2021-08-23 ENCOUNTER — Encounter: Payer: Self-pay | Admitting: Internal Medicine

## 2021-08-23 ENCOUNTER — Ambulatory Visit (INDEPENDENT_AMBULATORY_CARE_PROVIDER_SITE_OTHER): Payer: Medicare Other | Admitting: *Deleted

## 2021-08-23 ENCOUNTER — Other Ambulatory Visit: Payer: Self-pay

## 2021-08-23 VITALS — BP 128/72 | HR 58 | Ht 63.0 in | Wt 158.2 lb

## 2021-08-23 DIAGNOSIS — C9110 Chronic lymphocytic leukemia of B-cell type not having achieved remission: Secondary | ICD-10-CM | POA: Diagnosis not present

## 2021-08-23 DIAGNOSIS — I480 Paroxysmal atrial fibrillation: Secondary | ICD-10-CM

## 2021-08-23 DIAGNOSIS — I252 Old myocardial infarction: Secondary | ICD-10-CM

## 2021-08-23 DIAGNOSIS — I2510 Atherosclerotic heart disease of native coronary artery without angina pectoris: Secondary | ICD-10-CM

## 2021-08-23 DIAGNOSIS — Z5181 Encounter for therapeutic drug level monitoring: Secondary | ICD-10-CM | POA: Diagnosis not present

## 2021-08-23 LAB — POCT INR: INR: 3.1 — AB (ref 2.0–3.0)

## 2021-08-23 NOTE — Patient Instructions (Signed)
Medication Instructions:  Your physician recommends that you continue on your current medications as directed. Please refer to the Current Medication list given to you today.  *If you need a refill on your cardiac medications before your next appointment, please call your pharmacy*   Follow-Up: At [facility], you and your health needs are our priority.  As part of our continuing mission to provide you with exceptional heart care, we have created designated Provider Care Teams.  These Care Teams include your primary Cardiologist (physician) and Advanced Practice Providers (APPs -  Physician Assistants and Nurse Practitioners) who all work together to provide you with the care you need, when you need it.  We recommend signing up for the patient portal called "MyChart".  Sign up information is provided on this After Visit Summary.  MyChart is used to connect with patients for Virtual Visits (Telemedicine).  Patients are able to view lab/test results, encounter notes, upcoming appointments, etc.  Non-urgent messages can be sent to your provider as well.   To learn more about what you can do with MyChart, go to [email or web address].    Your next appointment:   6 month(s)  The format for your next appointment:   In Person  Provider:   Dr. Phineas Inches
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
## 2021-08-23 NOTE — Patient Instructions (Signed)
Description    Hold warfarin, then continue warfarin dose 2.5mg  daily except for 1.25mg  every Tuesday. Repeat INR in 3 weeks.  Eat greens tonight or tomorrow.

## 2021-08-24 ENCOUNTER — Ambulatory Visit: Payer: Medicare Other | Admitting: Cardiovascular Disease

## 2021-08-26 ENCOUNTER — Other Ambulatory Visit (HOSPITAL_COMMUNITY): Payer: Self-pay | Admitting: Cardiovascular Disease

## 2021-08-26 ENCOUNTER — Other Ambulatory Visit (HOSPITAL_BASED_OUTPATIENT_CLINIC_OR_DEPARTMENT_OTHER): Payer: Self-pay | Admitting: Cardiovascular Disease

## 2021-08-26 DIAGNOSIS — Z95828 Presence of other vascular implants and grafts: Secondary | ICD-10-CM

## 2021-08-26 DIAGNOSIS — I739 Peripheral vascular disease, unspecified: Secondary | ICD-10-CM

## 2021-08-31 DIAGNOSIS — C9110 Chronic lymphocytic leukemia of B-cell type not having achieved remission: Secondary | ICD-10-CM | POA: Diagnosis not present

## 2021-08-31 DIAGNOSIS — I10 Essential (primary) hypertension: Secondary | ICD-10-CM | POA: Diagnosis not present

## 2021-08-31 DIAGNOSIS — M546 Pain in thoracic spine: Secondary | ICD-10-CM | POA: Diagnosis not present

## 2021-08-31 DIAGNOSIS — Z0000 Encounter for general adult medical examination without abnormal findings: Secondary | ICD-10-CM | POA: Diagnosis not present

## 2021-08-31 DIAGNOSIS — E785 Hyperlipidemia, unspecified: Secondary | ICD-10-CM | POA: Diagnosis not present

## 2021-08-31 DIAGNOSIS — G8929 Other chronic pain: Secondary | ICD-10-CM | POA: Diagnosis not present

## 2021-08-31 DIAGNOSIS — K5909 Other constipation: Secondary | ICD-10-CM | POA: Diagnosis not present

## 2021-09-08 ENCOUNTER — Other Ambulatory Visit: Payer: Self-pay | Admitting: Cardiovascular Disease

## 2021-09-12 ENCOUNTER — Ambulatory Visit: Payer: Medicare Other | Admitting: Oncology

## 2021-09-12 ENCOUNTER — Other Ambulatory Visit: Payer: Medicare Other

## 2021-09-13 ENCOUNTER — Ambulatory Visit (INDEPENDENT_AMBULATORY_CARE_PROVIDER_SITE_OTHER): Payer: Medicare Other | Admitting: *Deleted

## 2021-09-13 ENCOUNTER — Other Ambulatory Visit: Payer: Self-pay

## 2021-09-13 DIAGNOSIS — Z5181 Encounter for therapeutic drug level monitoring: Secondary | ICD-10-CM

## 2021-09-13 DIAGNOSIS — I480 Paroxysmal atrial fibrillation: Secondary | ICD-10-CM | POA: Diagnosis not present

## 2021-09-13 LAB — POCT INR: INR: 3.5 — AB (ref 2.0–3.0)

## 2021-09-13 NOTE — Patient Instructions (Signed)
Description   Hold warfarin today and then START taking warfarin 1 tablet daily except for a 1/2 on Tuesday and Saturday. Recheck INR in 2 weeks. [facility] [phone]

## 2021-09-20 ENCOUNTER — Telehealth: Payer: Self-pay | Admitting: Internal Medicine

## 2021-09-20 NOTE — Telephone Encounter (Signed)
Pt called questioning why does she have two test ordered under Dr. Kennon Holter name  ABI & Aorta/IVC/iliaics when she just saw Dr. Harl Bowie. Pt advised she see Dr. Harl Bowie as her general cardiologist and Dr. Gwenlyn Found for PV. Pt verbalized understanding.

## 2021-09-20 NOTE — Telephone Encounter (Signed)
Patient is questioning why Dr. Gwenlyn Found order a test for her.  She saw Dr. Harl Bowie on 10/11, the orders were place on 10/14.

## 2021-09-21 DIAGNOSIS — L570 Actinic keratosis: Secondary | ICD-10-CM | POA: Diagnosis not present

## 2021-09-21 DIAGNOSIS — D2272 Melanocytic nevi of left lower limb, including hip: Secondary | ICD-10-CM | POA: Diagnosis not present

## 2021-09-21 DIAGNOSIS — D2262 Melanocytic nevi of left upper limb, including shoulder: Secondary | ICD-10-CM | POA: Diagnosis not present

## 2021-09-21 DIAGNOSIS — D2261 Melanocytic nevi of right upper limb, including shoulder: Secondary | ICD-10-CM | POA: Diagnosis not present

## 2021-09-21 DIAGNOSIS — Z85828 Personal history of other malignant neoplasm of skin: Secondary | ICD-10-CM | POA: Diagnosis not present

## 2021-09-21 DIAGNOSIS — X32XXXA Exposure to sunlight, initial encounter: Secondary | ICD-10-CM | POA: Diagnosis not present

## 2021-09-21 DIAGNOSIS — L821 Other seborrheic keratosis: Secondary | ICD-10-CM | POA: Diagnosis not present

## 2021-09-27 DIAGNOSIS — Z23 Encounter for immunization: Secondary | ICD-10-CM | POA: Diagnosis not present

## 2021-09-30 ENCOUNTER — Other Ambulatory Visit: Payer: Self-pay

## 2021-09-30 ENCOUNTER — Ambulatory Visit (INDEPENDENT_AMBULATORY_CARE_PROVIDER_SITE_OTHER): Payer: Medicare Other

## 2021-09-30 DIAGNOSIS — Z5181 Encounter for therapeutic drug level monitoring: Secondary | ICD-10-CM

## 2021-09-30 DIAGNOSIS — I480 Paroxysmal atrial fibrillation: Secondary | ICD-10-CM | POA: Diagnosis not present

## 2021-09-30 LAB — POCT INR: INR: 2.5 (ref 2.0–3.0)

## 2021-09-30 NOTE — Patient Instructions (Signed)
Continue warfarin 1 tablet daily except for a 1/2 on Tuesday and Saturday. Recheck INR in 5 weeks. [facility] [phone]

## 2021-10-10 ENCOUNTER — Ambulatory Visit: Payer: Medicare Other | Admitting: Cardiovascular Disease

## 2021-10-31 ENCOUNTER — Ambulatory Visit (INDEPENDENT_AMBULATORY_CARE_PROVIDER_SITE_OTHER): Payer: Medicare Other

## 2021-10-31 ENCOUNTER — Other Ambulatory Visit: Payer: Self-pay

## 2021-10-31 DIAGNOSIS — I480 Paroxysmal atrial fibrillation: Secondary | ICD-10-CM | POA: Diagnosis not present

## 2021-10-31 DIAGNOSIS — Z5181 Encounter for therapeutic drug level monitoring: Secondary | ICD-10-CM

## 2021-10-31 LAB — POCT INR: INR: 2.1 (ref 2.0–3.0)

## 2021-10-31 NOTE — Patient Instructions (Signed)
Continue warfarin 1 tablet daily except for a 1/2 on Tuesday and Saturday. Recheck INR in 5 weeks. [facility] [phone]

## 2021-11-13 DIAGNOSIS — Z20828 Contact with and (suspected) exposure to other viral communicable diseases: Secondary | ICD-10-CM | POA: Diagnosis not present

## 2021-11-15 ENCOUNTER — Telehealth: Payer: Self-pay | Admitting: Cardiovascular Disease

## 2021-11-15 ENCOUNTER — Telehealth: Payer: Self-pay | Admitting: Internal Medicine

## 2021-11-15 NOTE — Telephone Encounter (Signed)
Discussed with Overton Mam NP  Will have patient take  Metoprolol tart 25 mg at 10:30 pm today and 10:30 am tomorrow if HR >60 and BP  >110/60  Take it easy today, stay hydrated, change positions slowly, and keep appointment tomorrow Advised patient, verbalized understanding

## 2021-11-15 NOTE — Telephone Encounter (Signed)
STAT if HR is under 50 or over 120 (normal HR is 60-100 beats per minute)  What is your heart rate? 94  Do you have a log of your heart rate readings (document readings)? 121/60; 94; 138/78; 116; 136/90; 120; 97/69; 134; 68/56; 134  Do you have any other symptoms? Back pain in center between shoulder blades; exhausted, short-winded

## 2021-11-15 NOTE — Telephone Encounter (Signed)
Spoke with patient and she was at grocery store 12/30 went she went into Afib She has not had any Afib in about 3 years HR has been elevated since, readings below   121/60; 94; 138/78; 116; 136/90; 120; 97/69; 134; 68/56; 134  She took Metoprolol 25 mg around 10:30 am and 11:30 am blood pressure 121/60 HR 94  She had one Metoprolol 1/1 yesterday 4:00 pm, 3:30 am 1/3 and 10:30 am   SBP prior to Afib had started going up to 150's   She has had back pain, shortness of breath with lifting anything, feeling fatigue. Has not missed any doses of Warfarin   Scheduled appointment with Dr Harl Bowie for tomorrow

## 2021-11-15 NOTE — Telephone Encounter (Incomplete)
error                                                                     Do you have a log of your heart rate readings (document readings)? No   Do you have any other symptoms? ***

## 2021-11-16 ENCOUNTER — Emergency Department (HOSPITAL_COMMUNITY): Payer: Medicare Other

## 2021-11-16 ENCOUNTER — Ambulatory Visit (INDEPENDENT_AMBULATORY_CARE_PROVIDER_SITE_OTHER): Payer: Medicare Other | Admitting: Internal Medicine

## 2021-11-16 ENCOUNTER — Inpatient Hospital Stay (HOSPITAL_COMMUNITY)
Admission: EM | Admit: 2021-11-16 | Discharge: 2021-11-20 | DRG: 309 | Disposition: A | Discharge status: Home / Self Care | Payer: Medicare Other | Attending: Cardiovascular Disease | Admitting: Cardiovascular Disease

## 2021-11-16 ENCOUNTER — Encounter: Payer: Self-pay | Admitting: Internal Medicine

## 2021-11-16 ENCOUNTER — Other Ambulatory Visit: Payer: Self-pay

## 2021-11-16 VITALS — BP 108/60 | HR 134 | Ht 63.0 in | Wt 161.4 lb

## 2021-11-16 DIAGNOSIS — I252 Old myocardial infarction: Secondary | ICD-10-CM

## 2021-11-16 DIAGNOSIS — Z800 Family history of malignant neoplasm of digestive organs: Secondary | ICD-10-CM

## 2021-11-16 DIAGNOSIS — Z7901 Long term (current) use of anticoagulants: Secondary | ICD-10-CM

## 2021-11-16 DIAGNOSIS — M8580 Other specified disorders of bone density and structure, unspecified site: Secondary | ICD-10-CM | POA: Diagnosis present

## 2021-11-16 DIAGNOSIS — C9110 Chronic lymphocytic leukemia of B-cell type not having achieved remission: Secondary | ICD-10-CM | POA: Diagnosis present

## 2021-11-16 DIAGNOSIS — I4892 Unspecified atrial flutter: Secondary | ICD-10-CM | POA: Diagnosis not present

## 2021-11-16 DIAGNOSIS — Z20822 Contact with and (suspected) exposure to covid-19: Secondary | ICD-10-CM | POA: Diagnosis present

## 2021-11-16 DIAGNOSIS — Z882 Allergy status to sulfonamides status: Secondary | ICD-10-CM

## 2021-11-16 DIAGNOSIS — I480 Paroxysmal atrial fibrillation: Secondary | ICD-10-CM | POA: Diagnosis present

## 2021-11-16 DIAGNOSIS — Z85118 Personal history of other malignant neoplasm of bronchus and lung: Secondary | ICD-10-CM | POA: Diagnosis not present

## 2021-11-16 DIAGNOSIS — R569 Unspecified convulsions: Secondary | ICD-10-CM | POA: Diagnosis present

## 2021-11-16 DIAGNOSIS — I340 Nonrheumatic mitral (valve) insufficiency: Secondary | ICD-10-CM | POA: Diagnosis not present

## 2021-11-16 DIAGNOSIS — I483 Typical atrial flutter: Principal | ICD-10-CM | POA: Diagnosis present

## 2021-11-16 DIAGNOSIS — E7800 Pure hypercholesterolemia, unspecified: Secondary | ICD-10-CM | POA: Diagnosis not present

## 2021-11-16 DIAGNOSIS — Z951 Presence of aortocoronary bypass graft: Secondary | ICD-10-CM

## 2021-11-16 DIAGNOSIS — Z91040 Latex allergy status: Secondary | ICD-10-CM

## 2021-11-16 DIAGNOSIS — Z888 Allergy status to other drugs, medicaments and biological substances status: Secondary | ICD-10-CM | POA: Diagnosis not present

## 2021-11-16 DIAGNOSIS — I2510 Atherosclerotic heart disease of native coronary artery without angina pectoris: Secondary | ICD-10-CM | POA: Diagnosis present

## 2021-11-16 DIAGNOSIS — Z8249 Family history of ischemic heart disease and other diseases of the circulatory system: Secondary | ICD-10-CM

## 2021-11-16 DIAGNOSIS — J449 Chronic obstructive pulmonary disease, unspecified: Secondary | ICD-10-CM | POA: Diagnosis not present

## 2021-11-16 DIAGNOSIS — R0789 Other chest pain: Secondary | ICD-10-CM | POA: Diagnosis not present

## 2021-11-16 DIAGNOSIS — I4891 Unspecified atrial fibrillation: Secondary | ICD-10-CM | POA: Diagnosis not present

## 2021-11-16 DIAGNOSIS — Z881 Allergy status to other antibiotic agents status: Secondary | ICD-10-CM

## 2021-11-16 DIAGNOSIS — I10 Essential (primary) hypertension: Secondary | ICD-10-CM | POA: Diagnosis not present

## 2021-11-16 DIAGNOSIS — R079 Chest pain, unspecified: Secondary | ICD-10-CM

## 2021-11-16 DIAGNOSIS — E785 Hyperlipidemia, unspecified: Secondary | ICD-10-CM | POA: Diagnosis present

## 2021-11-16 DIAGNOSIS — I7300 Raynaud's syndrome without gangrene: Secondary | ICD-10-CM | POA: Diagnosis present

## 2021-11-16 DIAGNOSIS — Z85828 Personal history of other malignant neoplasm of skin: Secondary | ICD-10-CM

## 2021-11-16 DIAGNOSIS — L309 Dermatitis, unspecified: Secondary | ICD-10-CM | POA: Diagnosis present

## 2021-11-16 DIAGNOSIS — I484 Atypical atrial flutter: Secondary | ICD-10-CM | POA: Diagnosis present

## 2021-11-16 DIAGNOSIS — J90 Pleural effusion, not elsewhere classified: Secondary | ICD-10-CM | POA: Diagnosis not present

## 2021-11-16 DIAGNOSIS — R002 Palpitations: Secondary | ICD-10-CM | POA: Diagnosis not present

## 2021-11-16 LAB — CBC WITH DIFFERENTIAL/PLATELET
Abs Immature Granulocytes: 0.08 10*3/uL — ABNORMAL HIGH (ref 0.00–0.07)
Basophils Absolute: 0.1 10*3/uL (ref 0.0–0.1)
Basophils Relative: 0 %
Eosinophils Absolute: 0.1 10*3/uL (ref 0.0–0.5)
Eosinophils Relative: 0 %
HCT: 43.8 % (ref 36.0–46.0)
Hemoglobin: 14.9 g/dL (ref 12.0–15.0)
Immature Granulocytes: 1 %
Lymphocytes Relative: 58 %
Lymphs Abs: 9.3 10*3/uL — ABNORMAL HIGH (ref 0.7–4.0)
MCH: 32.4 pg (ref 26.0–34.0)
MCHC: 34 g/dL (ref 30.0–36.0)
MCV: 95.2 fL (ref 80.0–100.0)
Monocytes Absolute: 0.7 10*3/uL (ref 0.1–1.0)
Monocytes Relative: 5 %
Neutro Abs: 5.7 10*3/uL (ref 1.7–7.7)
Neutrophils Relative %: 36 %
Platelets: 214 10*3/uL (ref 150–400)
RBC: 4.6 MIL/uL (ref 3.87–5.11)
RDW: 12.9 % (ref 11.5–15.5)
WBC: 16 10*3/uL — ABNORMAL HIGH (ref 4.0–10.5)
nRBC: 0 % (ref 0.0–0.2)

## 2021-11-16 LAB — BASIC METABOLIC PANEL
Anion gap: 8 (ref 5–15)
BUN: 14 mg/dL (ref 8–23)
CO2: 26 mmol/L (ref 22–32)
Calcium: 9.3 mg/dL (ref 8.9–10.3)
Chloride: 102 mmol/L (ref 98–111)
Creatinine, Ser: 0.75 mg/dL (ref 0.44–1.00)
GFR, Estimated: >60 mL/min (ref >60)
Glucose, Bld: 110 mg/dL — ABNORMAL HIGH (ref 70–99)
Potassium: 4.7 mmol/L (ref 3.5–5.1)
Sodium: 136 mmol/L (ref 135–145)

## 2021-11-16 LAB — URINALYSIS, ROUTINE W REFLEX MICROSCOPIC
Bilirubin Urine: NEGATIVE
Glucose, UA: NEGATIVE mg/dL
Ketones, ur: NEGATIVE mg/dL
Nitrite: NEGATIVE
Protein, ur: NEGATIVE mg/dL
Specific Gravity, Urine: 1.004 — ABNORMAL LOW (ref 1.005–1.030)
pH: 6 (ref 5.0–8.0)

## 2021-11-16 LAB — BRAIN NATRIURETIC PEPTIDE: B Natriuretic Peptide: 255.3 pg/mL — ABNORMAL HIGH (ref 0.0–100.0)

## 2021-11-16 LAB — TROPONIN I (HIGH SENSITIVITY)
Troponin I (High Sensitivity): 5 ng/L (ref <18)
Troponin I (High Sensitivity): 5 ng/L (ref <18)

## 2021-11-16 LAB — RESP PANEL BY RT-PCR (FLU A&B, COVID) ARPGX2
Influenza A by PCR: NEGATIVE
Influenza B by PCR: NEGATIVE
SARS Coronavirus 2 by RT PCR: NEGATIVE

## 2021-11-16 LAB — PROTIME-INR
INR: 2.1 — ABNORMAL HIGH (ref 0.8–1.2)
Prothrombin Time: 23.2 seconds — ABNORMAL HIGH (ref 11.4–15.2)

## 2021-11-16 MED ORDER — SODIUM CHLORIDE 0.9 % IV BOLUS
500.0000 mL | Freq: Once | INTRAVENOUS | Status: AC
Start: 2021-11-16 — End: 2021-11-16
  Administered 2021-11-16: 500 mL via INTRAVENOUS

## 2021-11-16 MED ORDER — ACETAMINOPHEN 500 MG PO TABS: 1000.0000 mg | ORAL_TABLET | Freq: Three times a day (TID) | ORAL | Status: DC | PRN

## 2021-11-16 MED ORDER — ONDANSETRON HCL 4 MG/2ML IJ SOLN: 4.0000 mg | Freq: Four times a day (QID) | INTRAMUSCULAR | Status: DC | PRN

## 2021-11-16 MED ORDER — DILTIAZEM HCL-DEXTROSE 125-5 MG/125ML-% IV SOLN (PREMIX)
5.0000 mg/h | INTRAVENOUS | Status: DC
  Filled 2021-11-16: qty 125

## 2021-11-16 MED ORDER — ROSUVASTATIN CALCIUM 20 MG PO TABS
20.0000 mg | ORAL_TABLET | Freq: Every day | ORAL | Status: DC
  Administered 2021-11-16 – 2021-11-19 (×4): 20 mg via ORAL
  Filled 2021-11-16 (×5): qty 1

## 2021-11-16 MED ORDER — VITAMIN B-6 100 MG PO TABS
100.0000 mg | ORAL_TABLET | Freq: Every day | ORAL | Status: DC
  Administered 2021-11-17 – 2021-11-19 (×3): 100 mg via ORAL
  Filled 2021-11-16 (×4): qty 1

## 2021-11-16 MED ORDER — METOPROLOL SUCCINATE ER 25 MG PO TB24
25.0000 mg | ORAL_TABLET | Freq: Every day | ORAL | Status: DC
  Administered 2021-11-16: 25 mg via ORAL
  Filled 2021-11-16: qty 1

## 2021-11-16 MED ORDER — VITAMIN B-12 1000 MCG PO TABS
1000.0000 ug | ORAL_TABLET | Freq: Every day | ORAL | Status: DC
Start: 2021-11-17 — End: 2021-11-20
  Administered 2021-11-17 – 2021-11-19 (×3): 1000 ug via ORAL
  Filled 2021-11-16 (×3): qty 1

## 2021-11-16 MED ORDER — UMECLIDINIUM-VILANTEROL 62.5-25 MCG/ACT IN AEPB
1.0000 | INHALATION_SPRAY | Freq: Every day | RESPIRATORY_TRACT | Status: DC
  Administered 2021-11-17 – 2021-11-19 (×3): 1 via RESPIRATORY_TRACT
  Filled 2021-11-16: qty 14

## 2021-11-16 MED ORDER — VITAMIN D 25 MCG (1000 UNIT) PO TABS
1000.0000 [IU] | ORAL_TABLET | Freq: Every day | ORAL | Status: DC
  Administered 2021-11-16 – 2021-11-20 (×5): 1000 [IU] via ORAL
  Filled 2021-11-16 (×7): qty 1

## 2021-11-16 MED ORDER — VALACYCLOVIR HCL 500 MG PO TABS
500.0000 mg | ORAL_TABLET | Freq: Every day | ORAL | Status: DC
  Administered 2021-11-16 – 2021-11-19 (×4): 500 mg via ORAL
  Filled 2021-11-16 (×6): qty 1

## 2021-11-16 MED ORDER — POLYVINYL ALCOHOL 1.4 % OP SOLN
1.0000 [drp] | Freq: Three times a day (TID) | OPHTHALMIC | Status: DC
Start: 2021-11-16 — End: 2021-11-20
  Administered 2021-11-17 – 2021-11-20 (×9): 1 [drp] via OPHTHALMIC
  Filled 2021-11-16: qty 15

## 2021-11-16 MED ORDER — DILTIAZEM HCL-DEXTROSE 125-5 MG/125ML-% IV SOLN (PREMIX): 5.0000 mg/h | INTRAVENOUS | Status: DC

## 2021-11-16 MED ORDER — DOCUSATE SODIUM 100 MG PO CAPS: 200.0000 mg | ORAL_CAPSULE | Freq: Every day | ORAL | Status: DC | PRN

## 2021-11-16 MED ORDER — NITROGLYCERIN 0.4 MG SL SUBL: 0.4000 mg | SUBLINGUAL_TABLET | SUBLINGUAL | Status: DC | PRN

## 2021-11-16 MED ORDER — DILTIAZEM LOAD VIA INFUSION
10.0000 mg | Freq: Once | INTRAVENOUS | Status: DC
  Filled 2021-11-16: qty 10

## 2021-11-16 MED ORDER — LISINOPRIL 20 MG PO TABS
20.0000 mg | ORAL_TABLET | Freq: Two times a day (BID) | ORAL | Status: DC
  Administered 2021-11-16 – 2021-11-17 (×2): 20 mg via ORAL
  Filled 2021-11-16 (×3): qty 1

## 2021-11-16 MED ORDER — ISOSORBIDE MONONITRATE ER 30 MG PO TB24
30.0000 mg | ORAL_TABLET | Freq: Two times a day (BID) | ORAL | Status: DC
  Administered 2021-11-16 – 2021-11-20 (×8): 30 mg via ORAL
  Filled 2021-11-16 (×8): qty 1

## 2021-11-16 MED ORDER — POLYETHYLENE GLYCOL 3350 17 G PO PACK
17.0000 g | PACK | Freq: Every day | ORAL | Status: DC
  Administered 2021-11-16 – 2021-11-18 (×3): 17 g via ORAL
  Filled 2021-11-16 (×5): qty 1

## 2021-11-16 MED ORDER — ALBUTEROL SULFATE HFA 108 (90 BASE) MCG/ACT IN AERS: 2.0000 | INHALATION_SPRAY | Freq: Four times a day (QID) | RESPIRATORY_TRACT | Status: DC | PRN

## 2021-11-16 MED ORDER — EZETIMIBE 10 MG PO TABS
10.0000 mg | ORAL_TABLET | Freq: Every day | ORAL | Status: DC
Start: 2021-11-16 — End: 2021-11-20
  Administered 2021-11-16 – 2021-11-20 (×5): 10 mg via ORAL
  Filled 2021-11-16 (×6): qty 1

## 2021-11-16 MED ORDER — ACETAMINOPHEN 325 MG PO TABS
650.0000 mg | ORAL_TABLET | ORAL | Status: DC | PRN
  Administered 2021-11-17 – 2021-11-20 (×6): 650 mg via ORAL
  Filled 2021-11-16 (×6): qty 2

## 2021-11-16 MED ORDER — ALBUTEROL SULFATE (2.5 MG/3ML) 0.083% IN NEBU: 2.5000 mg | INHALATION_SOLUTION | Freq: Four times a day (QID) | RESPIRATORY_TRACT | Status: DC | PRN
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
## 2021-11-16 NOTE — ED Triage Notes (Signed)
PT c/o of high heart rate since Friday. Per pt, they were seen by PCP given metoprolol for a flutter but did not resolve. PCP told pt to be seen today.  Hx: afib, CABAGE,

## 2021-11-16 NOTE — Telephone Encounter (Signed)
Patient seen today 1/4 with Dr. Harl Bowie in office and referred to Emergency Room for direct admission.

## 2021-11-16 NOTE — ED Notes (Signed)
RN paged Cardiology MD regarding pt going back into afib, HR 140s
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
## 2021-11-16 NOTE — ED Notes (Signed)
Pt back into NSR on the monitor, HR 95-105

## 2021-11-16 NOTE — ED Provider Notes (Signed)
[facility] EMERGENCY DEPARTMENT Provider Note   CSN: [id number] Arrival date & time: 11/16/21  1237     History  Chief Complaint  Patient presents with   Irregular Heart Beat    [name] is a 76 y.o. female.  The history is provided by the patient.  Chest Pain Pain location:  L chest and R chest Pain quality: pressure   Pain radiates to:  Upper back Pain severity:  Mild Onset quality:  Gradual Duration:  5 days Timing:  Intermittent Progression:  Waxing and waning Chronicity:  New Relieved by:  Rest Worsened by:  Exertion Associated symptoms: fatigue and weakness   Associated symptoms: no abdominal pain, no back pain, no cough, no fever, no palpitations, no shortness of breath and no vomiting   Risk factors: coronary artery disease, high cholesterol and hypertension   Risk factors: no prior DVT/PE       Home Medications Prior to Admission medications   Medication Sig Start Date End Date Taking? Authorizing Provider  acetaminophen (TYLENOL) 500 MG tablet Take 1,000 mg by mouth every 8 (eight) hours as needed (pain.).    Yes [provider]  albuterol (VENTOLIN HFA) 108 (90 Base) MCG/ACT inhaler Inhale 2 puffs into the lungs every 6 (six) hours as needed for wheezing or shortness of breath. 05/27/21  Yes Icard, Octavio Graves, DO  Calcium Carb-Cholecalciferol (CALTRATE 600+D3 PO) Take 1 tablet by mouth daily at 12 noon.   Yes [provider]  Carboxymethylcellulose Sodium (THERATEARS) 0.25 % SOLN Place 1 drop into both eyes 3 (three) times daily. 08/24/20  Yes [provider]  Cholecalciferol ([employer]S D3) 25 MCG (1000 UT) capsule Take 1,000 Units by mouth daily. 08/13/12  Yes [provider]  docusate sodium (COLACE) 100 MG capsule Take 200 mg by mouth daily as needed for mild constipation. 10/03/19  Yes [provider]  ezetimibe (ZETIA) 10 MG tablet Take 1 tablet (10 mg total) by mouth daily. 03/14/21 11/16/21 Yes  Troy Sine, MD  hydrocortisone valerate cream (WESTCORT) 0.2 % Apply 1 application topically as needed (for eczema).   Yes [provider]  isosorbide mononitrate (IMDUR) 30 MG 24 hr tablet TAKE 1 TABLET BY MOUTH TWICE DAILY Patient taking differently: 30 mg daily. 09/08/21  Yes Troy Sine, MD  lisinopril (ZESTRIL) 20 MG tablet TAKE 1 TABLET BY MOUTH TWICE DAILY Patient taking differently: Take 20 mg by mouth daily. 03/30/21  Yes Troy Sine, MD  metoprolol tartrate (LOPRESSOR) 25 MG tablet Take 1 tablet (25 mg total) by mouth daily as needed (afib). Patient taking differently: Take 25 mg by mouth daily as needed (afib). Per patient, she takes more than one a day as needed for Afib 04/28/21  Yes Troy Sine, MD  nitroGLYCERIN (NITROSTAT) 0.4 MG SL tablet Place 1 tablet (0.4 mg total) under the tongue every 5 (five) minutes x 3 doses as needed for chest pain. 02/28/21  Yes Troy Sine, MD  polyethylene glycol (MIRALAX / GLYCOLAX) 17 g packet Take 17 g by mouth daily. 07/14/20  Yes [provider]  pyridOXINE (VITAMIN B-6) 100 MG tablet Take 100 mg by mouth daily at 12 noon.   Yes [provider]  rosuvastatin (CRESTOR) 20 MG tablet TAKE 1 TABLET(20 MG) BY MOUTH DAILY Patient taking differently: Take 20 mg by mouth daily. 07/19/21  Yes Troy Sine, MD  umeclidinium-vilanterol (ANORO ELLIPTA) 62.5-25 MCG/INH AEPB Inhale 1 puff into the lungs daily. 05/27/21  Yes Icard, Bradley L, DO  valACYclovir (VALTREX) 500 MG tablet Take 500 mg by mouth daily. 08/16/21  Yes [provider]  vitamin B-12 (CYANOCOBALAMIN) 500 MCG tablet Take 1,000 mcg by mouth daily at 12 noon.   Yes [provider]  warfarin (COUMADIN) 2.5 MG tablet TAKE 1 TO 2 TABLETS BY MOUTH DAILY AS DIRECTED BY COUMADIN CLINIC Patient taking differently: Take 1.25-2.5 mg by mouth See admin instructions. Take 2.5 mg tablet by mouth every day except on Tuesdays and Saturdays take 1.25  mg by mouth per patient 07/06/21  Yes Troy Sine, MD  metoprolol succinate (TOPROL-XL) 25 MG 24 hr tablet TAKE 1 TABLET(25 MG) BY MOUTH DAILY Patient taking differently: Take 25 mg by mouth daily. 07/04/21   Troy Sine, MD      Allergies    Amiodarone, Amlodipine, Clindamycin/lincomycin, Doxycycline, Sulfa antibiotics, Lipitor [atorvastatin], Phenergan [promethazine hcl], Reclast [zoledronic acid], Carvedilol, Cephalexin, Ketorolac, Diltiazem, and Latex    Review of Systems   Review of Systems  Constitutional:  Positive for fatigue. Negative for chills and fever.  HENT:  Negative for ear pain and sore throat.   Eyes:  Negative for pain and visual disturbance.  Respiratory:  Negative for cough and shortness of breath.   Cardiovascular:  Positive for chest pain. Negative for palpitations.  Gastrointestinal:  Negative for abdominal pain and vomiting.  Genitourinary:  Negative for dysuria and hematuria.  Musculoskeletal:  Negative for arthralgias and back pain.  Skin:  Negative for color change and rash.  Neurological:  Positive for weakness. Negative for seizures and syncope.  All other systems reviewed and are negative.  Physical Exam Updated Vital Signs BP 139/72    Pulse 70    Temp 98.1 F (36.7 C) (Oral)    Resp (!) 21    SpO2 99%  Physical Exam Vitals and nursing note reviewed.  Constitutional:      General: She is not in acute distress.    Appearance: She is well-developed. She is not ill-appearing.  HENT:     Head: Normocephalic and atraumatic.     Nose: Nose normal.     Mouth/Throat:     Mouth: Mucous membranes are moist.  Eyes:     Extraocular Movements: Extraocular movements intact.     Conjunctiva/sclera: Conjunctivae normal.     Pupils: Pupils are equal, round, and reactive to light.  Cardiovascular:     Rate and Rhythm: Tachycardia present. Rhythm irregular.     Heart sounds: No murmur heard. Pulmonary:     Effort: Pulmonary effort is normal. No  respiratory distress.     Breath sounds: Normal breath sounds.  Abdominal:     Palpations: Abdomen is soft.     Tenderness: There is no abdominal tenderness.  Musculoskeletal:        General: No swelling. Normal range of motion.     Cervical back: Normal range of motion and neck supple.     Right lower leg: No edema.     Left lower leg: No edema.  Skin:    General: Skin is warm and dry.     Capillary Refill: Capillary refill takes less than 2 seconds.  Neurological:     General: No focal deficit present.     Mental Status: She is alert.  Psychiatric:        Mood and Affect: Mood normal.    ED Results / Procedures / Treatments   Labs (all labs ordered are listed, but only abnormal results  are displayed) Labs Reviewed  CBC WITH DIFFERENTIAL/PLATELET - Abnormal; Notable for the following components:      Result Value   WBC 16.0 (*)    All other components within normal limits  BASIC METABOLIC PANEL - Abnormal; Notable for the following components:   Glucose, Bld 110 (*)    All other components within normal limits  RESP PANEL BY RT-PCR (FLU A&B, COVID) ARPGX2  PROTIME-INR  URINALYSIS, ROUTINE W REFLEX MICROSCOPIC  BRAIN NATRIURETIC PEPTIDE  TROPONIN I (HIGH SENSITIVITY)  TROPONIN I (HIGH SENSITIVITY)    EKG EKG Interpretation  Date/Time:  Wednesday November 16 2021 12:55:11 EST Ventricular Rate:  135 PR Interval:    QRS Duration: 98 QT Interval:  340 QTC Calculation: 510 R Axis:   48 Text Interpretation: Atrial flutter with 2:1 A-V conduction Possible Inferior infarct , age undetermined Cannot rule out Anterior infarct , age undetermined Abnormal ECG When compared with ECG of 01-Jun-2019 12:13, PREVIOUS ECG IS PRESENT Confirmed by Lennice Sites [phone]) on 11/16/2021 3:31:52 PM  Radiology DG Chest 1 View  Result Date: 11/16/2021 CLINICAL DATA:  Palpitations EXAM: CHEST  1 VIEW COMPARISON:  03/29/2020 FINDINGS: Status post median sternotomy and CABG. Unchanged cardiac and  mediastinal contours. Aortic atherosclerosis. Unchanged small left pleural effusion. No right pleural effusion. Mild interstitial prominence, without focal pulmonary opacity. No acute osseous abnormality. IMPRESSION: Small chronic left pleural effusion, which appear stable compared to prior exams. No acute cardiopulmonary process. Electronically Signed   By: Merilyn Baba M.D.   On: 11/16/2021 13:49    Procedures Procedures    Medications Ordered in ED Medications  sodium chloride 0.9 % bolus 500 mL (500 mLs Intravenous New Bag/Given 11/16/21 1603)    ED Course/ Medical Decision Making/ A&P                           Medical Decision Making  JULIANA BOLING is a 76 year old female with history of hypertension, high cholesterol, CAD status post CABG, paroxysmal A. fib on Coumadin who presents to the ED with chest pain, rapid heart rate.  Patient was sent from cardiology office for admission for ACS rule out and for further management of her atrial fibrillation.  I reviewed cardiology note from today.  Currently she is in atrial fibrillation with RVR however while in the room with the patient she spontaneously converted over to a normal sinus rhythm.  She is not having active chest pain but over the last 5 days she has been having exertional chest pain.  Upon chart review she did have CABG about 10 years ago and has not had an ischemic work-up since.  She has been compliant with taking her medications including taking additional doses of metoprolol.  Overall EKG does not show any new ischemic changes.  Blood tests have been ordered to evaluate for ACS including troponin.  Chest x-ray has also been ordered.  Have low suspicion for pulmonary embolism given that she is on Coumadin and no respiratory symptoms or hypoxia.  Have less suspicion for dissection or pneumonia/infectious process.  4:22 PM cardiology has been consulted and they will come down to the ED to admit the patient.  Patient has been  reevaluated and she maintains normal sinus rhythm.  Checks x-ray reviewed and interpreted by me shows small chronic left pleural effusion.  Otherwise no pneumonia or pneumothorax.  Lab work reviewed and interpreted by me is overall unremarkable.  Troponin is normal at 5.  She has no significant anemia, electrolyte abnormality, kidney injury otherwise.  Cardiology to admit for further ACS rule out.  This was discussed with family member at the bedside as well.  Given her multiple cardiac risk factors and comorbidities she will require admission for further work-up.  This chart was dictated using voice recognition software.  Despite best efforts to proofread,  errors can occur which can change the documentation meaning.         Final Clinical Impression(s) / ED Diagnoses Final diagnoses:  Atrial fibrillation with RVR ([place])  Chest pain, unspecified type    Rx / DC Orders ED Discharge Orders     None         Lennice Sites, DO 11/16/21 1622

## 2021-11-16 NOTE — ED Notes (Signed)
Pt has allergy to diltiazem in the the chart. Allergy is weakness to oral diltiazem. RN verified with cards MD and was told to administer medicine. Pt is nervous about taking medication and refused. Cardiology MD paged with update. HR continues to remain in the 140s.

## 2021-11-16 NOTE — ED Provider Triage Note (Signed)
Emergency Medicine Provider Triage Evaluation Note  ROSAMARY BOUDREAU , a 76 y.o. female  was evaluated in triage.  Pt complains of palpitations x5 days.  Patient seen by cardiology today and sent to the ED for troponin x3 and IV Dilt or metoprolol. She is currently on Warfarin which she has been compliant with. She admits to intermittent left-sided sharp chest pain. Patient has a history of paroxysmal A. fib/flutter.  Per chart review, patient has angioedema symptoms with amiodarone.  Review of Systems  Positive: palpitations Negative: SOB  Physical Exam  BP 128/90 (BP Location: Right Arm)    Pulse (!) 137    Temp 98.1 F (36.7 C) (Oral)    Resp 20    SpO2 96%  Gen:   Awake, no distress   Resp:  Normal effort  MSK:   Moves extremities without difficulty  Other:    Medical Decision Making  Medically screening exam initiated at 1:17 PM.  Appropriate orders placed.  SHAKORA NORDQUIST was informed that the remainder of the evaluation will be completed by another provider, this initial triage assessment does not replace that evaluation, and the importance of remaining in the ED until their evaluation is complete.  Tachycardic in triage Informed, Charge RN, karen need for room due to HR in 130s-140s. Cardiac labs ordered per cardiology recommendations   Karie Kirks 11/16/21 1323
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
## 2021-11-16 NOTE — Patient Instructions (Signed)
Medication Instructions:  No Changes In Medications at this time.  *If you need a refill on your cardiac medications before your next appointment, please call your pharmacy*  Follow-Up: At [facility], you and your health needs are our priority.  As part of our continuing mission to provide you with exceptional heart care, we have created designated Provider Care Teams.  These Care Teams include your primary Cardiologist (physician) and Advanced Practice Providers (APPs -  Physician Assistants and Nurse Practitioners) who all work together to provide you with the care you need, when you need it.  Your next appointment:   [name]   The format for your next appointment:   In Person  Provider:   Dr. Harl Bowie {  Other Instructions PLEASE REPORT DIRECTLY TO ER [facility] [place]

## 2021-11-17 ENCOUNTER — Observation Stay (HOSPITAL_COMMUNITY): Payer: Medicare Other

## 2021-11-17 ENCOUNTER — Encounter (HOSPITAL_COMMUNITY): Payer: Self-pay | Admitting: Physician Assistant

## 2021-11-17 DIAGNOSIS — Z7901 Long term (current) use of anticoagulants: Secondary | ICD-10-CM | POA: Diagnosis not present

## 2021-11-17 DIAGNOSIS — Z91040 Latex allergy status: Secondary | ICD-10-CM | POA: Diagnosis not present

## 2021-11-17 DIAGNOSIS — Z85118 Personal history of other malignant neoplasm of bronchus and lung: Secondary | ICD-10-CM | POA: Diagnosis not present

## 2021-11-17 DIAGNOSIS — I4891 Unspecified atrial fibrillation: Secondary | ICD-10-CM

## 2021-11-17 DIAGNOSIS — I483 Typical atrial flutter: Secondary | ICD-10-CM | POA: Diagnosis present

## 2021-11-17 DIAGNOSIS — Z85828 Personal history of other malignant neoplasm of skin: Secondary | ICD-10-CM | POA: Diagnosis not present

## 2021-11-17 DIAGNOSIS — M8580 Other specified disorders of bone density and structure, unspecified site: Secondary | ICD-10-CM | POA: Diagnosis present

## 2021-11-17 DIAGNOSIS — L309 Dermatitis, unspecified: Secondary | ICD-10-CM | POA: Diagnosis present

## 2021-11-17 DIAGNOSIS — I10 Essential (primary) hypertension: Secondary | ICD-10-CM | POA: Diagnosis present

## 2021-11-17 DIAGNOSIS — I252 Old myocardial infarction: Secondary | ICD-10-CM | POA: Diagnosis not present

## 2021-11-17 DIAGNOSIS — Z800 Family history of malignant neoplasm of digestive organs: Secondary | ICD-10-CM | POA: Diagnosis not present

## 2021-11-17 DIAGNOSIS — Z8249 Family history of ischemic heart disease and other diseases of the circulatory system: Secondary | ICD-10-CM | POA: Diagnosis not present

## 2021-11-17 DIAGNOSIS — I480 Paroxysmal atrial fibrillation: Secondary | ICD-10-CM | POA: Diagnosis present

## 2021-11-17 DIAGNOSIS — I4892 Unspecified atrial flutter: Secondary | ICD-10-CM | POA: Diagnosis present

## 2021-11-17 DIAGNOSIS — I2510 Atherosclerotic heart disease of native coronary artery without angina pectoris: Secondary | ICD-10-CM | POA: Diagnosis present

## 2021-11-17 DIAGNOSIS — Z888 Allergy status to other drugs, medicaments and biological substances status: Secondary | ICD-10-CM | POA: Diagnosis not present

## 2021-11-17 DIAGNOSIS — Z951 Presence of aortocoronary bypass graft: Secondary | ICD-10-CM | POA: Diagnosis not present

## 2021-11-17 DIAGNOSIS — I272 Pulmonary hypertension, unspecified: Secondary | ICD-10-CM | POA: Insufficient documentation

## 2021-11-17 DIAGNOSIS — Z882 Allergy status to sulfonamides status: Secondary | ICD-10-CM | POA: Diagnosis not present

## 2021-11-17 DIAGNOSIS — Z881 Allergy status to other antibiotic agents status: Secondary | ICD-10-CM | POA: Diagnosis not present

## 2021-11-17 DIAGNOSIS — I7300 Raynaud's syndrome without gangrene: Secondary | ICD-10-CM | POA: Diagnosis present

## 2021-11-17 DIAGNOSIS — E7800 Pure hypercholesterolemia, unspecified: Secondary | ICD-10-CM | POA: Diagnosis present

## 2021-11-17 DIAGNOSIS — I340 Nonrheumatic mitral (valve) insufficiency: Secondary | ICD-10-CM | POA: Diagnosis present

## 2021-11-17 DIAGNOSIS — C9110 Chronic lymphocytic leukemia of B-cell type not having achieved remission: Secondary | ICD-10-CM | POA: Diagnosis present

## 2021-11-17 DIAGNOSIS — J449 Chronic obstructive pulmonary disease, unspecified: Secondary | ICD-10-CM | POA: Diagnosis present

## 2021-11-17 DIAGNOSIS — R569 Unspecified convulsions: Secondary | ICD-10-CM | POA: Diagnosis present

## 2021-11-17 DIAGNOSIS — Z20822 Contact with and (suspected) exposure to covid-19: Secondary | ICD-10-CM | POA: Diagnosis present

## 2021-11-17 LAB — PROTIME-INR
INR: 2.1 — ABNORMAL HIGH (ref 0.8–1.2)
Prothrombin Time: 23.2 seconds — ABNORMAL HIGH (ref 11.4–15.2)

## 2021-11-17 LAB — LIPID PANEL
Cholesterol: 123 mg/dL (ref 0–200)
HDL: 44 mg/dL (ref >40)
LDL Cholesterol: 59 mg/dL (ref 0–99)
Total CHOL/HDL Ratio: 2.8 RATIO
Triglycerides: 98 mg/dL (ref <150)
VLDL: 20 mg/dL (ref 0–40)

## 2021-11-17 LAB — ECHOCARDIOGRAM COMPLETE
AR max vel: 1.58 cm2
AV Area VTI: 1.55 cm2
AV Area mean vel: 1.41 cm2
AV Mean grad: 4 mmHg
AV Peak grad: 7.7 mmHg
Ao pk vel: 1.39 m/s
Area-P 1/2: 6.37 cm2
Height: 63 in
S' Lateral: 2.9 cm
Weight: 2582.4 oz

## 2021-11-17 LAB — CBC
HCT: 39.6 % (ref 36.0–46.0)
Hemoglobin: 13.4 g/dL (ref 12.0–15.0)
MCH: 32.5 pg (ref 26.0–34.0)
MCHC: 33.8 g/dL (ref 30.0–36.0)
MCV: 96.1 fL (ref 80.0–100.0)
Platelets: 177 10*3/uL (ref 150–400)
RBC: 4.12 MIL/uL (ref 3.87–5.11)
RDW: 12.8 % (ref 11.5–15.5)
WBC: 14.9 10*3/uL — ABNORMAL HIGH (ref 4.0–10.5)
nRBC: 0.3 % — ABNORMAL HIGH (ref 0.0–0.2)

## 2021-11-17 LAB — BASIC METABOLIC PANEL
Anion gap: 7 (ref 5–15)
BUN: 21 mg/dL (ref 8–23)
CO2: 22 mmol/L (ref 22–32)
Calcium: 8.7 mg/dL — ABNORMAL LOW (ref 8.9–10.3)
Chloride: 104 mmol/L (ref 98–111)
Creatinine, Ser: 0.82 mg/dL (ref 0.44–1.00)
GFR, Estimated: >60 mL/min (ref >60)
Glucose, Bld: 112 mg/dL — ABNORMAL HIGH (ref 70–99)
Potassium: 3.9 mmol/L (ref 3.5–5.1)
Sodium: 133 mmol/L — ABNORMAL LOW (ref 135–145)

## 2021-11-17 LAB — MAGNESIUM: Magnesium: 1.9 mg/dL (ref 1.7–2.4)

## 2021-11-17 LAB — TSH: TSH: 2.235 u[IU]/mL (ref 0.350–4.500)

## 2021-11-17 MED ORDER — DILTIAZEM LOAD VIA INFUSION
10.0000 mg | Freq: Once | INTRAVENOUS | Status: AC
  Administered 2021-11-17: 10 mg via INTRAVENOUS
  Filled 2021-11-17: qty 10

## 2021-11-17 MED ORDER — SODIUM CHLORIDE 0.9% FLUSH
3.0000 mL | Freq: Two times a day (BID) | INTRAVENOUS | Status: DC
  Administered 2021-11-17 – 2021-11-19 (×5): 3 mL via INTRAVENOUS

## 2021-11-17 MED ORDER — POTASSIUM CHLORIDE CRYS ER 20 MEQ PO TBCR
40.0000 meq | EXTENDED_RELEASE_TABLET | Freq: Once | ORAL | Status: AC
  Administered 2021-11-17: 40 meq via ORAL
  Filled 2021-11-17: qty 2

## 2021-11-17 MED ORDER — METOPROLOL TARTRATE 12.5 MG HALF TABLET
12.5000 mg | ORAL_TABLET | Freq: Four times a day (QID) | ORAL | Status: DC
  Administered 2021-11-17 (×3): 12.5 mg via ORAL
  Filled 2021-11-17 (×4): qty 1

## 2021-11-17 MED ORDER — SODIUM CHLORIDE 0.9% FLUSH: 3.0000 mL | INTRAVENOUS | Status: DC | PRN

## 2021-11-17 MED ORDER — SODIUM CHLORIDE 0.9 % IV SOLN: 250.0000 mL | INTRAVENOUS | Status: DC | PRN

## 2021-11-17 MED ORDER — WARFARIN - PHARMACIST DOSING INPATIENT: Freq: Every day | Status: DC

## 2021-11-17 MED ORDER — SODIUM CHLORIDE 0.9 % IV SOLN: INTRAVENOUS | Status: AC

## 2021-11-17 MED ORDER — WARFARIN SODIUM 3 MG PO TABS
3.0000 mg | ORAL_TABLET | Freq: Once | ORAL | Status: AC
  Administered 2021-11-17: 3 mg via ORAL
  Filled 2021-11-17: qty 1

## 2021-11-17 MED ORDER — MAGNESIUM SULFATE 2 GM/50ML IV SOLN
2.0000 g | Freq: Once | INTRAVENOUS | Status: AC
  Administered 2021-11-17: 2 g via INTRAVENOUS
  Filled 2021-11-17: qty 50

## 2021-11-17 MED ORDER — DOFETILIDE 500 MCG PO CAPS
500.0000 ug | ORAL_CAPSULE | Freq: Two times a day (BID) | ORAL | Status: DC
  Administered 2021-11-17 – 2021-11-19 (×4): 500 ug via ORAL
  Filled 2021-11-17 (×4): qty 1

## 2021-11-17 MED ORDER — DILTIAZEM HCL-DEXTROSE 125-5 MG/125ML-% IV SOLN (PREMIX)
5.0000 mg/h | INTRAVENOUS | Status: DC
  Administered 2021-11-17: 5 mg/h via INTRAVENOUS

## 2021-11-17 NOTE — ED Notes (Signed)
Breakfast tray delivered to pt at this time. Pt sitting on side of bed to eat. Tolerating well.

## 2021-11-17 NOTE — Progress Notes (Signed)
Pt concerned about tikosyn and metropolol interaction. Called pharmacy and was advised to hold metropolol due to HR being n the 50's. I held 2200 dose and page on-call physician cardiology/STEMI.

## 2021-11-17 NOTE — ED Notes (Signed)
Pt BP 85/65. Diltiazem stopped and MD paged. HR remains in the 130s
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
## 2021-11-17 NOTE — Progress Notes (Incomplete)
°  Echocardiogram 2D Echocardiogram has been performed.  Merrie Roof F 11/17/2021, 11:01 AM

## 2021-11-17 NOTE — Progress Notes (Signed)
Mobility Specialist Progress Note    11/17/21 1648  Mobility  Activity Ambulated in hall  Level of Assistance Standby assist, set-up cues, supervision of patient - no hands on  Assistive Device None  Distance Ambulated (ft) 410 ft  Mobility Ambulated independently in hallway  Mobility Response Tolerated fair  Mobility performed by Mobility specialist  $Mobility charge 1 Mobility   Pt received in bed and agreeable. No complaints on walk but pt was SOB from talking during. Returned to sitting EOB with call bell in reach and daughter present.   [facility] Mobility Specialist  M.S. Primary Phone: [phone] M.S. Secondary Phone: [phone]

## 2021-11-17 NOTE — TOC Progression Note (Addendum)
Transition of Care [facility]) - Progression Note    Patient Details  Name: [name] MRN: [id number] Date of Birth: 03-04-1946  Transition of Care [facility]) CM/SW Contact  Zenon Mayo, RN Phone Number: 11/17/2021, 2:43 PM  Clinical Narrative:     Transition of Care [facility]) Screening Note   Patient Details  Name: [name] Date of Birth: 09/15/1946   Transition of Care [facility]) CM/SW Contact:    Zenon Mayo, RN Phone Number: 11/17/2021, 2:43 PM    Transition of Care Department [facility]) has reviewed patient and no TOC needs have been identified at this time. We will continue to monitor patient advancement through interdisciplinary progression rounds. If new patient transition needs arise, please place a TOC consult.   Tikosyn initiation, benefit check in progress.         Expected Discharge Plan and Services                                                 Social Determinants of Health (SDOH) Interventions    Readmission Risk Interventions No flowsheet data found.

## 2021-11-17 NOTE — ED Notes (Signed)
Cardiology at bedside.

## 2021-11-17 NOTE — ED Notes (Signed)
Echo at bedside

## 2021-11-17 NOTE — Progress Notes (Signed)
Pharmacy: Dofetilide (Tikosyn) - Initial Consult Assessment and Electrolyte Replacement  Pharmacy consulted to assist in monitoring and replacing electrolytes in this 76 y.o. female admitted on 11/16/2021 undergoing dofetilide initiation. First dofetilide dose: planned 1/5 pm   Assessment:  Patient Exclusion Criteria: If any screening criteria checked as "Yes", then  patient  should NOT receive dofetilide until criteria item is corrected.  If Yes please indicate correction plan.  YES  NO Patient  Exclusion Criteria Correction Plan   []   [x]   Baseline QTc interval is greater than or equal to 440 msec. IF above YES box checked dofetilide contraindicated unless patient has ICD; then may proceed if QTc 500-550 msec or with known ventricular conduction abnormalities may proceed with QTc 550-600 msec. QTc =  444 per EP     []   [x]   Patient is known or suspected to have a digoxin level greater than 2 ng/ml: Lab Results  Component Value Date   DIGOXIN 0.8 02/22/2013   Not currently on digoxin    []   [x]   Creatinine clearance less than 20 ml/min (calculated using Cockcroft-Gault, actual body weight and serum creatinine): Estimated Creatinine Clearance: 55.1 mL/min (by C-G formula based on SCr of 0.82 mg/dL).     []   [x]  Patient has received drugs known to prolong the QT intervals within the last 48 hours (phenothiazines, tricyclics or tetracyclic antidepressants, erythromycin, H-1 antihistamines, cisapride, fluoroquinolones, azithromycin, ondansetron).   Updated information on QT prolonging agents is available to be searched on the following database:QT prolonging agents     []   [x]   Patient received a dose of hydrochlorothiazide (O[place]ic) alone or in any combination including triamterene (Dyazide, Maxzide) in the last 48 hours.    []   [x]  Patient received a medication known to increase dofetilide plasma concentrations prior to initial dofetilide dose:  Trimethoprim (Primsol,  Proloprim) in the last 36 hours Verapamil (Calan, Verelan) in the last 36 hours or a sustained release dose in the last 72 hours Megestrol (Megace) in the last 5 days  Cimetidine (Tagamet) in the last 6 hours Ketoconazole (Nizoral) in the last 24 hours Itraconazole (Sporanox) in the last 48 hours  Prochlorperazine (Compazine) in the last 36 hours     []   [x]   Patient is known to have a history of torsades de pointes; congenital or acquired long QT syndromes.    []   [x]   Patient has received a Class 1 antiarrhythmic with less than 2 half-lives since last dose. (Disopyramide, Quinidine, Procainamide, Lidocaine, Mexiletine, Flecainide, Propafenone)    []   [x]   Patient has received amiodarone therapy in the past 3 months or amiodarone level is greater than 0.3 ng/ml.    Patient has been appropriately anticoagulated with warfarin INR 2.1   Labs:    Component Value Date/Time   K 3.9 11/17/2021 0255   K 4.2 06/28/2014 1753   MG 1.9 11/17/2021 0255   MG 1.7 (L) 06/28/2014 1753     Plan: Potassium: K 3.8-3.9:  Hold Tikosyn initiation and give KCl 40 mEq po x1 then begin Tikosyn at least 2hr after KCl dose - do not need to recheck K   Magnesium: Mg 1.8-2: Give Mg 2 gm IV x1 to prevent Mg from dropping below 1.8 - do not need to recheck Mg. Appropriate to initiate [place].D. CPP, BCPS Clinical Pharmacist [phone] 11/17/2021 5:01 PM
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
## 2021-11-18 ENCOUNTER — Other Ambulatory Visit (HOSPITAL_COMMUNITY): Payer: Self-pay

## 2021-11-18 ENCOUNTER — Telehealth: Payer: Self-pay | Admitting: Physician Assistant

## 2021-11-18 DIAGNOSIS — R569 Unspecified convulsions: Secondary | ICD-10-CM | POA: Diagnosis not present

## 2021-11-18 DIAGNOSIS — C9110 Chronic lymphocytic leukemia of B-cell type not having achieved remission: Secondary | ICD-10-CM | POA: Diagnosis not present

## 2021-11-18 DIAGNOSIS — I4892 Unspecified atrial flutter: Secondary | ICD-10-CM | POA: Diagnosis not present

## 2021-11-18 DIAGNOSIS — Z20822 Contact with and (suspected) exposure to covid-19: Secondary | ICD-10-CM | POA: Diagnosis not present

## 2021-11-18 DIAGNOSIS — I483 Typical atrial flutter: Secondary | ICD-10-CM | POA: Diagnosis not present

## 2021-11-18 LAB — MAGNESIUM: Magnesium: 2.5 mg/dL — ABNORMAL HIGH (ref 1.7–2.4)

## 2021-11-18 LAB — BASIC METABOLIC PANEL
Anion gap: 7 (ref 5–15)
BUN: 17 mg/dL (ref 8–23)
CO2: 22 mmol/L (ref 22–32)
Calcium: 8 mg/dL — ABNORMAL LOW (ref 8.9–10.3)
Chloride: 105 mmol/L (ref 98–111)
Creatinine, Ser: 0.78 mg/dL (ref 0.44–1.00)
GFR, Estimated: >60 mL/min (ref >60)
Glucose, Bld: 98 mg/dL (ref 70–99)
Potassium: 4.2 mmol/L (ref 3.5–5.1)
Sodium: 134 mmol/L — ABNORMAL LOW (ref 135–145)

## 2021-11-18 LAB — PROTIME-INR
INR: 1.9 — ABNORMAL HIGH (ref 0.8–1.2)
Prothrombin Time: 21.4 seconds — ABNORMAL HIGH (ref 11.4–15.2)

## 2021-11-18 MED ORDER — METOPROLOL TARTRATE 12.5 MG HALF TABLET
12.5000 mg | ORAL_TABLET | Freq: Three times a day (TID) | ORAL | Status: DC
Start: 2021-11-18 — End: 2021-11-19
  Administered 2021-11-18 – 2021-11-19 (×3): 12.5 mg via ORAL
  Filled 2021-11-18 (×4): qty 1

## 2021-11-18 MED ORDER — WARFARIN SODIUM 2.5 MG PO TABS
2.5000 mg | ORAL_TABLET | Freq: Once | ORAL | Status: AC
  Administered 2021-11-18: 2.5 mg via ORAL
  Filled 2021-11-18: qty 1

## 2021-11-18 NOTE — Telephone Encounter (Signed)
° °  Pt said, [name] visited her this morning, they got interrupted and she is trying to make a decision. She's requesting if [name] can call her to continue their discussion

## 2021-11-18 NOTE — Progress Notes (Signed)
For her Tikosyn fill over the weekend   Insurance cost is prohibitive Good Rx cost is affordable,   I found:  Dofetilide 162mcg IN STOCK currently at [facility] on [address], [place]  Dofetilide  269mcg IN STOCK currently at [facility] on [address], [place]  Dofetilide 560mcg IN STOCK current at her [facility] on [address].  I placed the Good Rx coupon for all options in her chart Discussed with the patient and her daughter  Afib clinic and EP follow up is in place  [place], [place]
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
## 2021-11-18 NOTE — Plan of Care (Signed)

## 2021-11-18 NOTE — Progress Notes (Addendum)
Progress Note  Patient Name: [name] Date of Encounter: 11/18/2021  [facility] Cardiologist: Shelva Majestic, MD   Subjective   Anxious with a lot of questions and concerns this AM, though physically feeling better Can appreciate that she is not in afib   Inpatient Medications    Scheduled Meds:  cholecalciferol  1,000 Units Oral Daily   dofetilide  500 mcg Oral BID   ezetimibe  10 mg Oral Daily   isosorbide mononitrate  30 mg Oral BID   metoprolol tartrate  12.5 mg Oral TID   polyethylene glycol  17 g Oral Daily   polyvinyl alcohol  1 drop Both Eyes TID   pyridOXINE  100 mg Oral Q1200   rosuvastatin  20 mg Oral Daily   sodium chloride flush  3 mL Intravenous Q12H   umeclidinium-vilanterol  1 puff Inhalation Daily   valACYclovir  500 mg Oral Daily   vitamin B-12  1,000 mcg Oral Q1200   warfarin  2.5 mg Oral ONCE-1600   Warfarin - Pharmacist Dosing Inpatient   Does not apply q1600   Continuous Infusions:  sodium chloride     PRN Meds: sodium chloride, acetaminophen, albuterol, docusate sodium, nitroGLYCERIN, sodium chloride flush   Vital Signs    Vitals:   11/17/21 2304 11/18/21 0430 11/18/21 0725 11/18/21 0800  BP: 137/63 (!) 99/43 (!) 129/57   Pulse: (!) 57 66    Resp:   16   Temp:  97.8 F (36.6 C) 97.7 F (36.5 C)   TempSrc:  Oral    SpO2:  94%  94%  Weight:      Height:        Intake/Output Summary (Last 24 hours) at 11/18/2021 0846 Last data filed at 11/18/2021 0300 Gross per 24 hour  Intake 290 ml  Output --  Net 290 ml   Last 3 Weights 11/17/2021 11/16/2021 08/23/2021  Weight (lbs) 159 lb 11.2 oz 161 lb 6.4 oz 158 lb 3.2 oz  Weight (kg) 72.439 kg 73.211 kg 71.759 kg      Telemetry    SB/SR 50's mostly some 60's, in/out briefly this AM PAFib 110's - Personally Reviewed  ECG    SB 52bpm, manually measured AT 558ms, QTc 465ms - Personally Reviewed  Physical Exam   GEN: No acute distress.   Neck: No JVD Cardiac: irreg-irreg, no  murmurs, rubs, or gallops.  Respiratory: CTA b/l. GI: Soft, nontender, non-distended  MS: No edema; advanced atrophy Neuro:  Nonfocal  Psych: Normal affect   Labs    High Sensitivity Troponin:   Recent Labs  Lab 11/16/21 1323 11/16/21 1604  TROPONINIHS 5 5     Chemistry Recent Labs  Lab 11/16/21 1323 11/17/21 0255 11/18/21 0026  NA 136 133* 134*  K 4.7 3.9 4.2  CL 102 104 105  CO2 26 22 22   GLUCOSE 110* 112* 98  BUN 14 21 17   CREATININE 0.75 0.82 0.78  CALCIUM 9.3 8.7* 8.0*  MG  --  1.9 2.5*  GFRNONAA >60 >60 >60  ANIONGAP 8 7 7     Lipids  Recent Labs  Lab 11/17/21 0255  CHOL 123  TRIG 98  HDL 44  LDLCALC 59  CHOLHDL 2.8    Hematology Recent Labs  Lab 11/16/21 1323 11/17/21 0255  WBC 16.0* 14.9*  RBC 4.60 4.12  HGB 14.9 13.4  HCT 43.8 39.6  MCV 95.2 96.1  MCH 32.4 32.5  MCHC 34.0 33.8  RDW 12.9 12.8  PLT 214  177   Thyroid  Recent Labs  Lab 11/17/21 0002  TSH 2.235    BNP Recent Labs  Lab 11/16/21 1604  BNP 255.3*    DDimer No results for input(s): DDIMER in the last 168 hours.   Radiology      Cardiac Studies   11/17/21: TTE IMPRESSIONS   1. Left ventricular ejection fraction, by estimation, is 65 to 70%. The  left ventricle has normal function. The left ventricle has no regional  wall motion abnormalities. Left ventricular diastolic parameters are  consistent with Grade II diastolic  dysfunction (pseudonormalization). Elevated left atrial pressure.   2. Right ventricular systolic function is normal. The right ventricular  size is normal. There is moderately elevated pulmonary artery systolic  pressure.   3. Left atrial size was severely dilated.   4. Aneurysmal dilitation of intratrial septum with septum bulgiing toward  right atrium consistent with increased L atrial pressures.   5. Mild mitral valve regurgitation.   6. Tricuspid valve regurgitation is mild to moderate.   7. The aortic valve is tricuspid. Aortic valve  regurgitation is not  visualized. Aortic valve sclerosis is present, with no evidence of aortic  valve stenosis.    02/04/21: stress myoview The left ventricular ejection fraction is normal (55-65%). Nuclear stress EF: 61%. There was no ST segment deviation noted during stress. The study is normal. This is a low risk study.   02/04/21: TTE IMPRESSIONS   1. Left ventricular ejection fraction, by estimation, is 55 to 60%. The  left ventricle has normal function. The left ventricle has no regional  wall motion abnormalities. Left ventricular diastolic parameters were  normal.   2. Right ventricular systolic function is normal. The right ventricular  size is normal. There is normal pulmonary artery systolic pressure.   3. Left atrial size was moderately dilated.   4. The mitral valve is normal in structure. Mild mitral valve  regurgitation. No evidence of mitral stenosis.   5. The aortic valve is tricuspid. Aortic valve regurgitation is not  visualized. Mild to moderate aortic valve sclerosis/calcification is  present, without any evidence of aortic stenosis.   6. The inferior vena cava is normal in size with greater than 50%  respiratory variability, suggesting right atrial pressure of 3 mmHg.  Patient Profile     76 y.o. female  with a hx of CAD (CABG 2008), COPD, smoker, Lung Ca (had a lung nodule identified on CT that was spiculated she underwent a VATs procedure s/p lobectomy, no hx of radiation she was diagnoesed with squamous cell carcinoma. It was not in the lymph nodes. There is a right lung lesion that is being followed. She gets CT scans every 6 months), PAD(high-grade calcified bilateral iliac stenoses and underwent orbital atherectomy, PTA, and covered stenting using kissing technique both iliac arteries July 3491 complicated by retroperitoneal bleed), October 2022, she reports diagnosis with CLL and watching and waiting. She is managed at [facility], Sjogren's, and Afib admitted with  CP and AFib and AFlutter w/RVR   Afib and AAD hx Diagnosed 2018 Both AFib and flutter are described by dr. Caryl Comes Prior discussion via AFib clnic did not want to pursue ablation or Tikosyn    Amiodarone remotely angioedema patient confirms this   On warfarin, [place] cost prohibitive   Assessment & Plan    Paroxysmal Afib, Aflutter (looks atypical) CHA2DS2Vasc is 5, on warfarin 1st episode in years (by symptoms) Tikosyn load is in progress K+ 4.2 Mag 2.5 Creat 0.78 (69Calc  CreatCl) QT has lengthened some, though in review with Dr. Caryl Comes, remains acceptable, will watch closely  INR 1.9 (2.1 yesterday) d/w RPH   She is in/out of AFib as I am with her this AM Discussed at length lopressor concerns, her AFib currently 110's, resting SR 50's, I will continue the lopressor for now with hold parameters that the patient is comfortable with and discussed with the RN. She has been on Toprol 25mg  daily chronically without brady issues  2. CAD HS Trop neg Continue home regime Low risks tress test march 2022  3. SOB Chronic at her baseline currently She does not look volume OL  For questions or updates, please contact [place] Please consult [email or web address] for contact info under        Signed, Baldwin Jamaica, PA-C  11/18/2021, 8:46 AM    Atrial flutter typical and atypical//Afib  QT prolongation  Sinus bradycardia  Dofetilide for above  CAD w remote bypass surgery  chest pain with AFlut, but neg myoview 2/22  Lengthy discussion regarding the role of dofetilide to try to reduce the risk of recurrence of her atrial arrhythmias.  Has had both fibrillation and flutter and has been reluctant to undertake ablation particular related to the risks of recurrence I think of the atrial fibrillation.  Her QTc remains borderline and I a suspect that she will need a lower than 500 mcg twice daily dose.  Now that we have the [employer], the cost of dofetilide is about $8 a  month per prescription so 375 twice daily is no longer cost precluding  The in and out of atrial fibrillation this morning following the first dose I do not think has impact on the likely benefit of the drug.  With her bradycardia, however, if she is able to sustain sinus rhythm I would be inclined to discontinue the metoprolol as the more recent data suggest that its benefit in chronic stable coronary disease is not significant.  I am bothered by the amount of chest discomfort she had with her atrial fibrillation/flutter and the issues of adequate sensitivity of her Myoview scanning.  We will reach out to Dr. Leda Gauze to have him think with me as to a how best to elucidate this.  It may be catheterization given her known coronary disease and bypass about 15 years ago however he may be dissuaded by the fact that her troponins were normal

## 2021-11-18 NOTE — Progress Notes (Signed)
Pharmacy: Dofetilide (Tikosyn) - Follow Up Assessment and Electrolyte Replacement  Pharmacy consulted to assist in monitoring and replacing electrolytes in this 76 y.o. female admitted on 11/16/2021 undergoing dofetilide initiation. First dofetilide dose: 500  mcg po bid started 11/17/2021 at 20:16 (time of first dose)  Labs:    Component Value Date/Time   K 4.2 11/18/2021 0026   K 4.2 06/28/2014 1753   MG 2.5 (H) 11/18/2021 0026   MG 1.7 (L) 06/28/2014 1753     Plan: Potassium: K >/= 4: No additional supplementation needed  Magnesium: Mg > 2: No additional supplementation needed   Thank you for allowing pharmacy to participate in this patient's care   Vaughan Basta BS, PharmD, BCPS Clinical Pharmacist 11/18/2021  8:23 AM

## 2021-11-18 NOTE — TOC Benefit Eligibility Note (Addendum)
Patient Teacher, English as a foreign language completed.    The patient is currently admitted and upon discharge could be taking dofetilide (Tikosyn) 500 mcg.  The current 30 day co-pay is, $168.64.   The patient is currently admitted and upon discharge could be taking dofetilide (Tikosyn) 250  mcg.  The current 30 day co-pay is, $192.58.   The patient is currently admitted and upon discharge could be taking dofetilide (Tikosyn) 125 mcg.  The current 30 day co-pay is, $166.73.   The patient is insured through [place], [place] Patient Advocate Specialist [place] Patient Advocate Team Direct Number: [phone]  Fax: [phone]

## 2021-11-18 NOTE — Progress Notes (Signed)
Heart went up to 130's a-fib after coming out from the bathroom, dose of lopressor given. Continue to monitor

## 2021-11-18 NOTE — Progress Notes (Signed)
Pt refused metoprolol stating her HR is normal. Paged [facility] night coverage.

## 2021-11-19 DIAGNOSIS — Z20822 Contact with and (suspected) exposure to covid-19: Secondary | ICD-10-CM | POA: Diagnosis not present

## 2021-11-19 DIAGNOSIS — I4892 Unspecified atrial flutter: Secondary | ICD-10-CM | POA: Diagnosis not present

## 2021-11-19 LAB — BASIC METABOLIC PANEL
Anion gap: 6 (ref 5–15)
BUN: 17 mg/dL (ref 8–23)
CO2: 23 mmol/L (ref 22–32)
Calcium: 8.5 mg/dL — ABNORMAL LOW (ref 8.9–10.3)
Chloride: 105 mmol/L (ref 98–111)
Creatinine, Ser: 0.69 mg/dL (ref 0.44–1.00)
GFR, Estimated: >60 mL/min (ref >60)
Glucose, Bld: 121 mg/dL — ABNORMAL HIGH (ref 70–99)
Potassium: 4 mmol/L (ref 3.5–5.1)
Sodium: 134 mmol/L — ABNORMAL LOW (ref 135–145)

## 2021-11-19 LAB — PROTIME-INR
INR: 1.9 — ABNORMAL HIGH (ref 0.8–1.2)
Prothrombin Time: 21.6 seconds — ABNORMAL HIGH (ref 11.4–15.2)

## 2021-11-19 LAB — PATHOLOGIST SMEAR REVIEW

## 2021-11-19 LAB — MAGNESIUM: Magnesium: 2.2 mg/dL (ref 1.7–2.4)

## 2021-11-19 MED ORDER — DOFETILIDE 500 MCG PO CAPS
500.0000 ug | ORAL_CAPSULE | Freq: Two times a day (BID) | ORAL | Status: DC
  Administered 2021-11-19: 500 ug via ORAL
  Filled 2021-11-19 (×2): qty 1

## 2021-11-19 MED ORDER — DOFETILIDE 250 MCG PO CAPS: 250.0000 ug | ORAL_CAPSULE | Freq: Two times a day (BID) | ORAL | Status: DC

## 2021-11-19 MED ORDER — METHOCARBAMOL 500 MG PO TABS
500.0000 mg | ORAL_TABLET | Freq: Four times a day (QID) | ORAL | Status: DC | PRN
  Administered 2021-11-19: 500 mg via ORAL
  Filled 2021-11-19: qty 1

## 2021-11-19 MED ORDER — METOPROLOL TARTRATE 25 MG PO TABS
25.0000 mg | ORAL_TABLET | Freq: Three times a day (TID) | ORAL | Status: DC
  Administered 2021-11-19 – 2021-11-20 (×3): 25 mg via ORAL
  Filled 2021-11-19 (×3): qty 1

## 2021-11-19 MED ORDER — WARFARIN SODIUM 4 MG PO TABS: 4.0000 mg | ORAL_TABLET | Freq: Once | ORAL | Status: DC

## 2021-11-19 MED ORDER — WARFARIN SODIUM 4 MG PO TABS
4.0000 mg | ORAL_TABLET | Freq: Once | ORAL | Status: AC
  Administered 2021-11-19: 4 mg via ORAL
  Filled 2021-11-19: qty 1

## 2021-11-19 NOTE — Progress Notes (Signed)
Progress Note  Patient Name: [name] Date of Encounter: 11/19/2021  [facility] Cardiologist: Shelva Majestic, MD   Subjective   Unfortunate went back into atrial fibrillation.  Feeling palpitations and mild shortness of breath.  States that she would prefer to avoid further medication management and would like ablation.  Inpatient Medications    Scheduled Meds:  cholecalciferol  1,000 Units Oral Daily   dofetilide  250 mcg Oral BID   ezetimibe  10 mg Oral Daily   isosorbide mononitrate  30 mg Oral BID   metoprolol tartrate  12.5 mg Oral TID   polyethylene glycol  17 g Oral Daily   polyvinyl alcohol  1 drop Both Eyes TID   pyridOXINE  100 mg Oral Q1200   rosuvastatin  20 mg Oral Daily   sodium chloride flush  3 mL Intravenous Q12H   umeclidinium-vilanterol  1 puff Inhalation Daily   valACYclovir  500 mg Oral Daily   vitamin B-12  1,000 mcg Oral Q1200   Warfarin - Pharmacist Dosing Inpatient   Does not apply q1600   Continuous Infusions:  sodium chloride     PRN Meds: sodium chloride, acetaminophen, albuterol, docusate sodium, nitroGLYCERIN, sodium chloride flush   Vital Signs    Vitals:   11/18/21 2255 11/19/21 0323 11/19/21 0735 11/19/21 0808  BP: (!) 157/71 (!) 109/52 139/71 (!) 178/83  Pulse: (!) 57 62 88 78  Resp: 14 20 18    Temp: (!) 97.3 F (36.3 C) 97.6 F (36.4 C) 97.6 F (36.4 C)   TempSrc: Oral Oral Oral   SpO2:  98%    Weight:      Height:        Intake/Output Summary (Last 24 hours) at 11/19/2021 0841 Last data filed at 11/19/2021 0813 Gross per 24 hour  Intake 203 ml  Output --  Net 203 ml    Last 3 Weights 11/17/2021 11/16/2021 08/23/2021  Weight (lbs) 159 lb 11.2 oz 161 lb 6.4 oz 158 lb 3.2 oz  Weight (kg) 72.439 kg 73.211 kg 71.759 kg      Telemetry    Atrial fibrillation-personally reviewed  ECG    Sinus rhythm, QTC 488 ms-personally reviewed  Physical Exam   GEN: Well nourished, well developed, in no acute distress   HEENT: normal  Neck: no JVD, carotid bruits, or masses Cardiac: tachycardic, irregular; no murmurs, rubs, or gallops,no edema  Respiratory:  clear to auscultation bilaterally, normal work of breathing GI: soft, nontender, nondistended, + BS MS: no deformity or atrophy  Skin: warm and dry Neuro:  Strength and sensation are intact Psych: euthymic mood, full affect   Labs    High Sensitivity Troponin:   Recent Labs  Lab 11/16/21 1323 11/16/21 1604  TROPONINIHS 5 5      Chemistry Recent Labs  Lab 11/17/21 0255 11/18/21 0026 11/19/21 0048  NA 133* 134* 134*  K 3.9 4.2 4.0  CL 104 105 105  CO2 22 22 23   GLUCOSE 112* 98 121*  BUN 21 17 17   CREATININE 0.82 0.78 0.69  CALCIUM 8.7* 8.0* 8.5*  MG 1.9 2.5* 2.2  GFRNONAA >60 >60 >60  ANIONGAP 7 7 6      Lipids  Recent Labs  Lab 11/17/21 0255  CHOL 123  TRIG 98  HDL 44  LDLCALC 59  CHOLHDL 2.8     Hematology Recent Labs  Lab 11/16/21 1323 11/17/21 0255  WBC 16.0* 14.9*  RBC 4.60 4.12  HGB 14.9 13.4  HCT 43.8  39.6  MCV 95.2 96.1  MCH 32.4 32.5  MCHC 34.0 33.8  RDW 12.9 12.8  PLT 214 177    Thyroid  Recent Labs  Lab 11/17/21 0002  TSH 2.235     BNP Recent Labs  Lab 11/16/21 1604  BNP 255.3*     DDimer No results for input(s): DDIMER in the last 168 hours.   Radiology      Cardiac Studies   11/17/21: TTE IMPRESSIONS   1. Left ventricular ejection fraction, by estimation, is 65 to 70%. The  left ventricle has normal function. The left ventricle has no regional  wall motion abnormalities. Left ventricular diastolic parameters are  consistent with Grade II diastolic  dysfunction (pseudonormalization). Elevated left atrial pressure.   2. Right ventricular systolic function is normal. The right ventricular  size is normal. There is moderately elevated pulmonary artery systolic  pressure.   3. Left atrial size was severely dilated.   4. Aneurysmal dilitation of intratrial septum with septum  bulgiing toward  right atrium consistent with increased L atrial pressures.   5. Mild mitral valve regurgitation.   6. Tricuspid valve regurgitation is mild to moderate.   7. The aortic valve is tricuspid. Aortic valve regurgitation is not  visualized. Aortic valve sclerosis is present, with no evidence of aortic  valve stenosis.    02/04/21: stress myoview The left ventricular ejection fraction is normal (55-65%). Nuclear stress EF: 61%. There was no ST segment deviation noted during stress. The study is normal. This is a low risk study.   02/04/21: TTE IMPRESSIONS   1. Left ventricular ejection fraction, by estimation, is 55 to 60%. The  left ventricle has normal function. The left ventricle has no regional  wall motion abnormalities. Left ventricular diastolic parameters were  normal.   2. Right ventricular systolic function is normal. The right ventricular  size is normal. There is normal pulmonary artery systolic pressure.   3. Left atrial size was moderately dilated.   4. The mitral valve is normal in structure. Mild mitral valve  regurgitation. No evidence of mitral stenosis.   5. The aortic valve is tricuspid. Aortic valve regurgitation is not  visualized. Mild to moderate aortic valve sclerosis/calcification is  present, without any evidence of aortic stenosis.   6. The inferior vena cava is normal in size with greater than 50%  respiratory variability, suggesting right atrial pressure of 3 mmHg.  Patient Profile     76 y.o. female  with a hx of CAD (CABG 2008), COPD, smoker, Lung Ca (had a lung nodule identified on CT that was spiculated she underwent a VATs procedure s/p lobectomy, no hx of radiation she was diagnoesed with squamous cell carcinoma. It was not in the lymph nodes. There is a right lung lesion that is being followed. She gets CT scans every 6 months), PAD(high-grade calcified bilateral iliac stenoses and underwent orbital atherectomy, PTA, and covered  stenting using kissing technique both iliac arteries July 5573 complicated by retroperitoneal bleed), October 2022, she reports diagnosis with CLL and watching and waiting. She is managed at [facility], Sjogren's, and Afib admitted with CP and AFib and AFlutter w/RVR   Afib and AAD hx Diagnosed 2018 Both AFib and flutter are described by dr. Caryl Comes Prior discussion via AFib clnic did not want to pursue ablation or Tikosyn    Amiodarone remotely angioedema patient confirms this   On warfarin, [place] cost prohibitive   Assessment & Plan    1.  Paroxysmal atrial fibrillation/typical  and atypical flutter: Currently on warfarin.  CHA2DS2-VASc of 5.  Being loaded on dofetilide.  QTC is remained stable.  We Kaydie Petsch continue the current dose.  Unfortunately she has had more frequent episodes of atrial fibrillation.  She would prefer ablation.  We Penni Penado continue dofetilide load to see if we can keep her in rhythm until ablation which would likely be in March.  2.  Coronary artery disease: High-sensitivity troponin negative.  Lower stress test March 2022.  Continue to monitor.  3.  Shortness of breath: Chronic at her baseline.  No obvious volume overload.   For questions or updates, please contact [place] Please consult [email or web address] for contact info under        Signed, Benedicto Capozzi Meredith Leeds, MD  11/19/2021, 8:41 AM

## 2021-11-19 NOTE — Progress Notes (Signed)
Pts CrCl is 78  Qtc is 0.48

## 2021-11-19 NOTE — Progress Notes (Signed)
Pharmacy: Dofetilide (Tikosyn) - Follow Up Assessment and Electrolyte Replacement  Pharmacy consulted to assist in monitoring and replacing electrolytes in this 76 y.o. female admitted on 11/16/2021 undergoing dofetilide initiation. First dofetilide dose: 500  mcg po bid started 11/17/2021.   Labs:    Component Value Date/Time   K 4.0 11/19/2021 0048   K 4.2 06/28/2014 1753   MG 2.2 11/19/2021 0048   MG 1.7 (L) 06/28/2014 1753     Plan: Potassium: K >/= 4: No additional supplementation needed  Magnesium: Mg > 2: No additional supplementation needed   Thank you for allowing pharmacy to participate in this patient's care   Erin Hearing PharmD., BCPS Clinical Pharmacist 11/19/2021 3:41 PM

## 2021-11-19 NOTE — Plan of Care (Signed)
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
## 2021-11-20 DIAGNOSIS — I4892 Unspecified atrial flutter: Secondary | ICD-10-CM | POA: Diagnosis not present

## 2021-11-20 LAB — BASIC METABOLIC PANEL
Anion gap: 10 (ref 5–15)
BUN: 13 mg/dL (ref 8–23)
CO2: 25 mmol/L (ref 22–32)
Calcium: 8.8 mg/dL — ABNORMAL LOW (ref 8.9–10.3)
Chloride: 103 mmol/L (ref 98–111)
Creatinine, Ser: 0.69 mg/dL (ref 0.44–1.00)
GFR, Estimated: >60 mL/min (ref >60)
Glucose, Bld: 94 mg/dL (ref 70–99)
Potassium: 3.8 mmol/L (ref 3.5–5.1)
Sodium: 138 mmol/L (ref 135–145)

## 2021-11-20 LAB — PROTIME-INR
INR: 2 — ABNORMAL HIGH (ref 0.8–1.2)
Prothrombin Time: 23.1 seconds — ABNORMAL HIGH (ref 11.4–15.2)

## 2021-11-20 LAB — MAGNESIUM: Magnesium: 1.8 mg/dL (ref 1.7–2.4)

## 2021-11-20 MED ORDER — METOPROLOL TARTRATE 25 MG PO TABS: 25.0000 mg | ORAL_TABLET | Freq: Three times a day (TID) | ORAL | 1 refills | Status: DC

## 2021-11-20 MED ORDER — POTASSIUM CHLORIDE CRYS ER 20 MEQ PO TBCR
30.0000 meq | EXTENDED_RELEASE_TABLET | Freq: Once | ORAL | Status: AC
  Administered 2021-11-20: 30 meq via ORAL
  Filled 2021-11-20: qty 1

## 2021-11-20 MED ORDER — MAGNESIUM OXIDE -MG SUPPLEMENT 400 (240 MG) MG PO TABS
800.0000 mg | ORAL_TABLET | Freq: Once | ORAL | Status: AC
  Administered 2021-11-20: 800 mg via ORAL
  Filled 2021-11-20: qty 2

## 2021-11-20 NOTE — Discharge Summary (Addendum)
Discharge Summary    Patient ID: [name] MRN: [id number]; DOB: Jul 15, 1946  Admit date: 11/16/2021 Discharge date: 11/20/2021  PCP:  [name], MD   [facility] Providers Cardiologist:  Shelva Majestic, MD  Electrophysiologist:  Virl Axe, MD  {   Discharge Diagnoses    Principal Problem:   Atrial flutter with rapid ventricular response [facility]) Active Problems:   Long term current use of anticoagulant therapy   Essential hypertension   Hyperlipidemia LDL goal <70  Diagnostic Studies/Procedures    Echo: 11/17/2021  IMPRESSIONS     1. Left ventricular ejection fraction, by estimation, is 65 to 70%. The  left ventricle has normal function. The left ventricle has no regional  wall motion abnormalities. Left ventricular diastolic parameters are  consistent with Grade II diastolic  dysfunction (pseudonormalization). Elevated left atrial pressure.   2. Right ventricular systolic function is normal. The right ventricular  size is normal. There is moderately elevated pulmonary artery systolic  pressure.   3. Left atrial size was severely dilated.   4. Aneurysmal dilitation of intratrial septum with septum bulgiing toward  right atrium consistent with increased L atrial pressures.   5. Mild mitral valve regurgitation.   6. Tricuspid valve regurgitation is mild to moderate.   7. The aortic valve is tricuspid. Aortic valve regurgitation is not  visualized. Aortic valve sclerosis is present, with no evidence of aortic  valve stenosis.   FINDINGS   Left Ventricle: Left ventricular ejection fraction, by estimation, is 65  to 70%. The left ventricle has normal function. The left ventricle has no  regional wall motion abnormalities. The left ventricular internal cavity  size was normal in size. There is   no left ventricular hypertrophy. Left ventricular diastolic parameters  are consistent with Grade II diastolic dysfunction (pseudonormalization).  Elevated left atrial  pressure.   Right Ventricle: The right ventricular size is normal. Right vetricular  wall thickness was not assessed. Right ventricular systolic function is  normal. There is moderately elevated pulmonary artery systolic pressure.  The tricuspid regurgitant velocity is   3.37 m/s, and with an assumed right atrial pressure of 3 mmHg, the  estimated right ventricular systolic pressure is 42.5 mmHg.   Left Atrium: Left atrial size was severely dilated.   Right Atrium: Right atrial size was normal in size.   Pericardium: There is no evidence of pericardial effusion.   Mitral Valve: Mild mitral valve regurgitation.   Tricuspid Valve: The tricuspid valve is normal in structure. Tricuspid  valve regurgitation is mild to moderate.   Aortic Valve: The aortic valve is tricuspid. Aortic valve regurgitation is  not visualized. Aortic valve sclerosis is present, with no evidence of  aortic valve stenosis. Aortic valve mean gradient measures 4.0 mmHg.  Aortic valve peak gradient measures 7.7   mmHg. Aortic valve area, by VTI measures 1.55 cm.   Pulmonic Valve: The pulmonic valve was not well visualized. Pulmonic valve  regurgitation is not visualized.   Aorta: The aortic root is normal in size and structure.   IAS/Shunts: No atrial level shunt detected by color flow Doppler.  _____________   History of Present Illness     [name] is a 76 y.o. female with a hx of CAD (CABG 2008), COPD, smoker, Lung Ca (had a lung nodule identified on CT that was spiculated she underwent a VATs procedure s/p lobectomy, no hx of radiation she was diagnoesed with squamous cell carcinoma. It was not in the lymph nodes. There  is a right lung lesion that is being followed. She gets CT scans every 6 months), PAD(high-grade calcified bilateral iliac stenoses and underwent orbital atherectomy, PTA, and covered stenting using kissing technique both iliac arteries July 9678 complicated by retroperitoneal bleed),  October 2022, she reports diagnosis with CLL and watching and waiting. She is managed at [facility], [facility], and Afib who presented with atrial flutter with RVR.   [name] had been dealing with intermittent episode of palpitation with substernal chest pressure as well as back pressure.  She felt severely fatigued.  Symptoms on and off since Friday prior to admission.  Minimal exertion caused her to have elevated heart rate.  Felt like she was running a marathon.  Took extra metoprolol without improvement.  Seen by Dr. Harl Bowie in clinic the day of admission. Noted in atrial flutter with rapid ventricular rate.  Sent to ER for further evaluation.   In ER, she was spontaneously converted to sinus rhythm.  She was worried that minimal activity would cause her to have palpitation, chest pressure and back pressure.   Patient reported since his lobectomy she was walking 2 miles every day.  However, lately she only able to walk about half a mile.  She felt severely fatigued.  She thought this was due to CLL.  She also reported unusual exertional limitation for past few weeks. Occasional chest pressure.   High-sensitivity troponin 5 WBC 16 Potassium 4.7 Serum creatinine normal INR 2.1 Chest x-ray: Small chronic left pleural effusion, which appear stable compared to prior exams. No acute cardiopulmonary process.  Hospital Course     Consultants: EP  Paroxysmal Afib/typical and atypical aflutter: she was admitted and evaluated by EP. Limited options as she had tried amiodarone in the past, known structural disease. Recommendations for tikosyn load which was started. She was in and out of atrial fib during admission. Metoprolol was increased to metoprolol 25mg  TID. She was seen by Dr. Curt Bears and noted she would like to pursue ablation. Attempts were made to continue on Tikosyn load, but patient reported back pain and weakness which she attributed to the tikosyn start. Therefore tikosyn was stopped with  continuation of metoprolol 25mg  TID -- continue coumadin per home dose -- recheck INR 1/12 or 1/13 -- follow up in the afib clinic   CAD s/p CABG '08: did have some chest pain on admission. hsTn negative. Resolved prior to discharge -- continue medical therapy  HTN: Stable -- metoprolol 25mg  TID -- lisinopril held at DC  HLD: on statin and Zetia  Patient was seen by Dr. Curt Bears and deemed stable for discharge home. Follow up in the Afib clinic arranged. Medications sent to Pharmacy of choice.   Did the patient have an acute coronary syndrome (MI, NSTEMI, STEMI, etc) this admission?:  No                               Did the patient have a percutaneous coronary intervention (stent / angioplasty)?:  No.   ____________  Discharge Vitals Blood pressure (!) 144/65, pulse 62, temperature 97.6 F (36.4 C), temperature source Oral, resp. rate 19, height 5\' 3"  (1.6 m), weight 72.4 kg, SpO2 93 %.  Filed Weights   11/17/21 1744  Weight: 72.4 kg    Labs & Radiologic Studies    CBC No results for input(s): WBC, NEUTROABS, HGB, HCT, MCV, PLT in the last 72 hours. Basic Metabolic Panel Recent Labs  11/19/21 0048 11/20/21 0055  NA 134* 138  K 4.0 3.8  CL 105 103  CO2 23 25  GLUCOSE 121* 94  BUN 17 13  CREATININE 0.69 0.69  CALCIUM 8.5* 8.8*  MG 2.2 1.8   Liver Function Tests No results for input(s): AST, ALT, ALKPHOS, BILITOT, PROT, ALBUMIN in the last 72 hours. No results for input(s): LIPASE, AMYLASE in the last 72 hours. High Sensitivity Troponin:   Recent Labs  Lab 11/16/21 1323 11/16/21 1604  TROPONINIHS 5 5    BNP Invalid input(s): POCBNP D-Dimer No results for input(s): DDIMER in the last 72 hours. Hemoglobin A1C No results for input(s): HGBA1C in the last 72 hours. Fasting Lipid Panel No results for input(s): CHOL, HDL, LDLCALC, TRIG, CHOLHDL, LDLDIRECT in the last 72 hours. Thyroid Function Tests No results for input(s): TSH, T4TOTAL, T3FREE, THYROIDAB in  the last 72 hours.  Invalid input(s): FREET3 _____________  DG Chest 1 View  Result Date: 11/16/2021 CLINICAL DATA:  Palpitations EXAM: CHEST  1 VIEW COMPARISON:  03/29/2020 FINDINGS: Status post median sternotomy and CABG. Unchanged cardiac and mediastinal contours. Aortic atherosclerosis. Unchanged small left pleural effusion. No right pleural effusion. Mild interstitial prominence, without focal pulmonary opacity. No acute osseous abnormality. IMPRESSION: Small chronic left pleural effusion, which appear stable compared to prior exams. No acute cardiopulmonary process. Electronically Signed   By: Merilyn Baba M.D.   On: 11/16/2021 13:49   ECHOCARDIOGRAM COMPLETE  Result Date: 11/17/2021    ECHOCARDIOGRAM REPORT   Patient Name:   [name] Date of Exam: 11/17/2021 Medical Rec #:  [id number]       Height:       63.0 in Accession #:    [id number]      Weight:       161.4 lb Date of Birth:  December 09, 1945       BSA:          1.765 m Patient Age:    12 years        BP:           120/60 mmHg Patient Gender: F               HR:           71 bpm. Exam Location:  Inpatient Procedure: 2D Echo, Cardiac Doppler and Color Doppler Indications:    Atrial fibrillation  History:        Patient has prior history of Echocardiogram examinations, most                 recent 02/05/2019. Previous Myocardial Infarction and CAD, Prior                 CABG, Lung Cancer; Risk Factors:Hypertension, Dyslipidemia and                 Former Smoker.  Sonographer:    Merrie Roof RDCS Referring Phys: [id number] [place]  1. Left ventricular ejection fraction, by estimation, is 65 to 70%. The left ventricle has normal function. The left ventricle has no regional wall motion abnormalities. Left ventricular diastolic parameters are consistent with Grade II diastolic dysfunction (pseudonormalization). Elevated left atrial pressure.  2. Right ventricular systolic function is normal. The right ventricular size is normal. There  is moderately elevated pulmonary artery systolic pressure.  3. Left atrial size was severely dilated.  4. Aneurysmal dilitation of intratrial septum with septum bulgiing toward right atrium consistent with increased L atrial pressures.  5. Mild  mitral valve regurgitation.  6. Tricuspid valve regurgitation is mild to moderate.  7. The aortic valve is tricuspid. Aortic valve regurgitation is not visualized. Aortic valve sclerosis is present, with no evidence of aortic valve stenosis. FINDINGS  Left Ventricle: Left ventricular ejection fraction, by estimation, is 65 to 70%. The left ventricle has normal function. The left ventricle has no regional wall motion abnormalities. The left ventricular internal cavity size was normal in size. There is  no left ventricular hypertrophy. Left ventricular diastolic parameters are consistent with Grade II diastolic dysfunction (pseudonormalization). Elevated left atrial pressure. Right Ventricle: The right ventricular size is normal. Right vetricular wall thickness was not assessed. Right ventricular systolic function is normal. There is moderately elevated pulmonary artery systolic pressure. The tricuspid regurgitant velocity is  3.37 m/s, and with an assumed right atrial pressure of 3 mmHg, the estimated right ventricular systolic pressure is 64.3 mmHg. Left Atrium: Left atrial size was severely dilated. Right Atrium: Right atrial size was normal in size. Pericardium: There is no evidence of pericardial effusion. Mitral Valve: Mild mitral valve regurgitation. Tricuspid Valve: The tricuspid valve is normal in structure. Tricuspid valve regurgitation is mild to moderate. Aortic Valve: The aortic valve is tricuspid. Aortic valve regurgitation is not visualized. Aortic valve sclerosis is present, with no evidence of aortic valve stenosis. Aortic valve mean gradient measures 4.0 mmHg. Aortic valve peak gradient measures 7.7  mmHg. Aortic valve area, by VTI measures 1.55 cm. Pulmonic  Valve: The pulmonic valve was not well visualized. Pulmonic valve regurgitation is not visualized. Aorta: The aortic root is normal in size and structure. IAS/Shunts: No atrial level shunt detected by color flow Doppler.  LEFT VENTRICLE PLAX 2D LVIDd:         4.60 cm   Diastology LVIDs:         2.90 cm   LV e' medial:    7.29 cm/s LV PW:         1.00 cm   LV E/e' medial:  15.0 LV IVS:        1.00 cm   LV e' lateral:   9.57 cm/s LVOT diam:     1.90 cm   LV E/e' lateral: 11.4 LV SV:         47 LV SV Index:   27 LVOT Area:     2.84 cm  RIGHT VENTRICLE RV Basal diam:  4.60 cm RV Mid diam:    3.70 cm LEFT ATRIUM              Index        RIGHT ATRIUM           Index LA diam:        4.70 cm  2.66 cm/m   RA Area:     12.10 cm LA Vol (A2C):   105.0 ml 59.48 ml/m  RA Volume:   23.70 ml  13.43 ml/m LA Vol (A4C):   110.0 ml 62.32 ml/m LA Biplane Vol: 108.0 ml 61.18 ml/m  AORTIC VALVE AV Area (Vmax):    1.58 cm AV Area (Vmean):   1.41 cm AV Area (VTI):     1.55 cm AV Vmax:           139.00 cm/s AV Vmean:          98.900 cm/s AV VTI:            0.306 m AV Peak Grad:      7.7 mmHg AV Mean Grad:  4.0 mmHg LVOT Vmax:         77.70 cm/s LVOT Vmean:        49.300 cm/s LVOT VTI:          0.167 m LVOT/AV VTI ratio: 0.55  AORTA Ao Root diam: 2.80 cm MITRAL VALVE                TRICUSPID VALVE MV Area (PHT): 6.37 cm     TR Peak grad:   45.4 mmHg MV Decel Time: 119 msec     TR Vmax:        337.00 cm/s MV E velocity: 109.00 cm/s MV A velocity: 39.80 cm/s   SHUNTS MV E/A ratio:  2.74         Systemic VTI:  0.17 m                             Systemic Diam: 1.90 cm Dorris Carnes MD Electronically signed by Dorris Carnes MD Signature Date/Time: 11/17/2021/3:13:41 PM    Final    Disposition   Pt is being discharged home today in good condition.  Follow-up Plans & Appointments     Follow-up Information     Fenton, Berton [place], PA Follow up on 11/24/2021.   Specialty: Cardiology Why: at 8:30am for your follow up appt Contact  information: [place] [address] [phone]         [facility] Follow up.   Specialty: Cardiology Why: Need repeat INR check by 1/12-1/13 Contact information: [address] [place] [phone]               Discharge Instructions     Call MD for:  difficulty breathing, headache or visual disturbances   Complete by: As directed    Call MD for:  persistant dizziness or light-headedness   Complete by: As directed    Call MD for:  redness, tenderness, or signs of infection (pain, swelling, redness, odor or green/yellow discharge around incision site)   Complete by: As directed    Diet - low sodium heart healthy   Complete by: As directed    Increase activity slowly   Complete by: As directed        Discharge Medications   Allergies as of 11/20/2021       Reactions   Amiodarone Anaphylaxis, Other (See Comments)   Angioedema, swelling of throat and tongue   Amlodipine Other (See Comments)   Other reaction(s): Unknown Pt states that she just can not tolerate, states blood pressure goes up and down and experiences leg heaviness.   Clindamycin/lincomycin Swelling   TROUBLE SWALLOWING......SEVERE CHEST PAIN   Doxycycline Other (See Comments)   blistering   Sulfa Antibiotics Photosensitivity, Rash, Other (See Comments)   Red, burning rash & paralysis Burning Rash   Lipitor [atorvastatin] Other (See Comments)   MYALGIAS LEG PAIN   Phenergan [promethazine Hcl] Other (See Comments)   Nervous Leg / Restless Leg Syndrome   Reclast [zoledronic Acid] Other (See Comments)   Flu symptoms - made pt very sick, and had inflammation  In her eye   Carvedilol Other (See Comments)   Per patient was "wiped out"  for 2 days after taking    Cephalexin    Ketorolac Nausea Only   headache   Diltiazem Other (See Comments)   Weakness on oral Dilt   Latex Rash  Medication List     STOP taking these  medications    lisinopril 20 MG tablet Commonly known as: ZESTRIL   metoprolol succinate 25 MG 24 hr tablet Commonly known as: TOPROL-XL       TAKE these medications    acetaminophen 500 MG tablet Commonly known as: TYLENOL Take 1,000 mg by mouth every 8 (eight) hours as needed (pain.).   albuterol 108 (90 Base) MCG/ACT inhaler Commonly known as: VENTOLIN HFA Inhale 2 puffs into the lungs every 6 (six) hours as needed for wheezing or shortness of breath.   CALTRATE 600+D3 PO Take 1 tablet by mouth daily at 12 noon.   [employer] D3 25 MCG (1000 UT) capsule Generic drug: Cholecalciferol Take 1,000 Units by mouth daily.   docusate sodium 100 MG capsule Commonly known as: COLACE Take 200 mg by mouth daily as needed for mild constipation.   ezetimibe 10 MG tablet Commonly known as: ZETIA Take 1 tablet (10 mg total) by mouth daily.   hydrocortisone valerate cream 0.2 % Commonly known as: WESTCORT Apply 1 application topically as needed (for eczema).   isosorbide mononitrate 30 MG 24 hr tablet Commonly known as: IMDUR TAKE 1 TABLET BY MOUTH TWICE DAILY What changed:  how to take this when to take this   metoprolol tartrate 25 MG tablet Commonly known as: LOPRESSOR Take 1 tablet (25 mg total) by mouth 3 (three) times daily. What changed:  when to take this reasons to take this   nitroGLYCERIN 0.4 MG SL tablet Commonly known as: NITROSTAT Place 1 tablet (0.4 mg total) under the tongue every 5 (five) minutes x 3 doses as needed for chest pain.   polyethylene glycol 17 g packet Commonly known as: MIRALAX / GLYCOLAX Take 17 g by mouth daily.   pyridOXINE 100 MG tablet Commonly known as: VITAMIN B-6 Take 100 mg by mouth daily at 12 noon.   rosuvastatin 20 MG tablet Commonly known as: CRESTOR TAKE 1 TABLET(20 MG) BY MOUTH DAILY What changed: See the new instructions.   Theratears 0.25 % Soln Generic drug: Carboxymethylcellulose Sodium Place 1 drop into both eyes 3  (three) times daily.   umeclidinium-vilanterol 62.5-25 MCG/INH Aepb Commonly known as: ANORO ELLIPTA Inhale 1 puff into the lungs daily.   valACYclovir 500 MG tablet Commonly known as: VALTREX Take 500 mg by mouth daily.   vitamin B-12 500 MCG tablet Commonly known as: CYANOCOBALAMIN Take 1,000 mcg by mouth daily at 12 noon.   warfarin 2.5 MG tablet Commonly known as: COUMADIN Take as directed. If you are unsure how to take this medication, talk to your nurse or doctor. Original instructions: TAKE 1 TO 2 TABLETS BY MOUTH DAILY AS DIRECTED BY COUMADIN CLINIC What changed: See the new instructions.        Outstanding Labs/Studies   INR check 1/12-1/13  Duration of Discharge Encounter   Greater than 30 minutes including physician time.  Signed, Reino Bellis, NP 11/20/2021, 10:53 AM  I have seen and examined this patient with Reino Bellis.  Agree with above, note added to reflect my findings.  Regular rhythm, no murmurs, lungs clear, no edema, no JVD.  Patient initially presented to the hospital with atrial flutter.  She converted to sinus rhythm in the emergency room.  It was thought that a rhythm control strategy would be most beneficial.  She was loaded on dofetilide.  Unfortunately she developed severe back and hip pain which she felt was due to to the dofetilide.  Plan  for discharge today.  She Tomi Grandpre follow-up in clinic for discussion of ablation.   Gabriellah Rabel M. Sanaa Zilberman MD 11/20/2021 12:30 PM

## 2021-11-21 ENCOUNTER — Telehealth: Payer: Self-pay

## 2021-11-21 NOTE — Telephone Encounter (Signed)
I spoke to patient and scheduled her for an INR appointment 1/12 @ 9:30.

## 2021-11-22 DIAGNOSIS — I25709 Atherosclerosis of coronary artery bypass graft(s), unspecified, with unspecified angina pectoris: Secondary | ICD-10-CM | POA: Diagnosis not present

## 2021-11-22 DIAGNOSIS — I110 Hypertensive heart disease with heart failure: Secondary | ICD-10-CM | POA: Diagnosis not present

## 2021-11-22 DIAGNOSIS — I5022 Chronic systolic (congestive) heart failure: Secondary | ICD-10-CM | POA: Diagnosis not present

## 2021-11-22 DIAGNOSIS — J449 Chronic obstructive pulmonary disease, unspecified: Secondary | ICD-10-CM | POA: Diagnosis not present

## 2021-11-22 DIAGNOSIS — I482 Chronic atrial fibrillation, unspecified: Secondary | ICD-10-CM | POA: Diagnosis not present

## 2021-11-24 ENCOUNTER — Encounter (HOSPITAL_COMMUNITY): Payer: Self-pay | Admitting: Physician Assistant

## 2021-11-24 ENCOUNTER — Ambulatory Visit (INDEPENDENT_AMBULATORY_CARE_PROVIDER_SITE_OTHER): Payer: Medicare Other

## 2021-11-24 ENCOUNTER — Ambulatory Visit (HOSPITAL_COMMUNITY)
Admission: RE | Admit: 2021-11-24 | Discharge: 2021-11-24 | Disposition: A | Discharge status: Home / Self Care | Payer: Medicare Other | Source: Ambulatory Visit | Attending: Physician Assistant | Admitting: Physician Assistant

## 2021-11-24 ENCOUNTER — Other Ambulatory Visit: Payer: Self-pay

## 2021-11-24 VITALS — BP 144/82 | HR 50 | Ht 63.0 in | Wt 161.4 lb

## 2021-11-24 DIAGNOSIS — Z79899 Other long term (current) drug therapy: Secondary | ICD-10-CM | POA: Diagnosis not present

## 2021-11-24 DIAGNOSIS — I484 Atypical atrial flutter: Secondary | ICD-10-CM

## 2021-11-24 DIAGNOSIS — Z85118 Personal history of other malignant neoplasm of bronchus and lung: Secondary | ICD-10-CM | POA: Diagnosis not present

## 2021-11-24 DIAGNOSIS — Z7901 Long term (current) use of anticoagulants: Secondary | ICD-10-CM | POA: Insufficient documentation

## 2021-11-24 DIAGNOSIS — I10 Essential (primary) hypertension: Secondary | ICD-10-CM | POA: Diagnosis not present

## 2021-11-24 DIAGNOSIS — I739 Peripheral vascular disease, unspecified: Secondary | ICD-10-CM | POA: Insufficient documentation

## 2021-11-24 DIAGNOSIS — Z951 Presence of aortocoronary bypass graft: Secondary | ICD-10-CM | POA: Insufficient documentation

## 2021-11-24 DIAGNOSIS — Z87891 Personal history of nicotine dependence: Secondary | ICD-10-CM | POA: Diagnosis not present

## 2021-11-24 DIAGNOSIS — D6869 Other thrombophilia: Secondary | ICD-10-CM | POA: Insufficient documentation

## 2021-11-24 DIAGNOSIS — J449 Chronic obstructive pulmonary disease, unspecified: Secondary | ICD-10-CM | POA: Insufficient documentation

## 2021-11-24 DIAGNOSIS — Z5181 Encounter for therapeutic drug level monitoring: Secondary | ICD-10-CM

## 2021-11-24 DIAGNOSIS — M3500 Sicca syndrome, unspecified: Secondary | ICD-10-CM | POA: Insufficient documentation

## 2021-11-24 DIAGNOSIS — I480 Paroxysmal atrial fibrillation: Secondary | ICD-10-CM | POA: Insufficient documentation

## 2021-11-24 DIAGNOSIS — I2510 Atherosclerotic heart disease of native coronary artery without angina pectoris: Secondary | ICD-10-CM | POA: Insufficient documentation

## 2021-11-24 DIAGNOSIS — I4892 Unspecified atrial flutter: Secondary | ICD-10-CM | POA: Diagnosis not present

## 2021-11-24 LAB — POCT INR: INR: 2.5 (ref 2.0–3.0)
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
## 2021-11-24 NOTE — Patient Instructions (Signed)
Continue warfarin 1 tablet daily except for a 1/2 on Tuesday and Saturday. Recheck INR in 4 weeks. [facility] Clinic [phone]

## 2021-11-28 ENCOUNTER — Telehealth: Payer: Self-pay | Admitting: Cardiology

## 2021-11-28 NOTE — Telephone Encounter (Signed)
Pt aware 3/29 spot help for her ablation. Aware I will be in touch about instructions, but it may be several weeks/February before she hears back from me. Patient verbalized understanding and agreeable to plan.

## 2021-11-28 NOTE — Telephone Encounter (Signed)
Patient states she was supposed to hear back about scheduling an ablation with Dr. Curt Bears.

## 2021-11-30 ENCOUNTER — Telehealth: Payer: Self-pay | Admitting: Cardiovascular Disease

## 2021-11-30 MED ORDER — EZETIMIBE 10 MG PO TABS: 10.0000 mg | ORAL_TABLET | Freq: Every day | ORAL | 1 refills | Status: DC

## 2021-11-30 NOTE — Telephone Encounter (Signed)
Refill sent to pharmacy.   

## 2021-11-30 NOTE — Telephone Encounter (Signed)
°*  STAT* If patient is at the pharmacy, call can be transferred to refill team.   1. Which medications need to be refilled? (please list name of each medication and dose if known)  ezetimibe (ZETIA) 10 MG tablet (Expired)  2. Which pharmacy/location (including street and city if local pharmacy) is medication to be sent to? [facility] [address] - [place], [place] [address]  3. Do they need a 30 day or 90 day supply?  90 day supply   Patient states she needs today

## 2021-12-01 ENCOUNTER — Ambulatory Visit (HOSPITAL_COMMUNITY)
Admission: RE | Admit: 2021-12-01 | Discharge: 2021-12-01 | Disposition: A | Discharge status: Home / Self Care | Payer: Medicare Other | Source: Ambulatory Visit | Attending: Internal Medicine | Admitting: Internal Medicine

## 2021-12-01 ENCOUNTER — Ambulatory Visit (HOSPITAL_BASED_OUTPATIENT_CLINIC_OR_DEPARTMENT_OTHER)
Admission: RE | Admit: 2021-12-01 | Discharge: 2021-12-01 | Disposition: A | Discharge status: Home / Self Care | Payer: Medicare Other | Source: Ambulatory Visit | Attending: Internal Medicine | Admitting: Internal Medicine

## 2021-12-01 ENCOUNTER — Other Ambulatory Visit: Payer: Self-pay

## 2021-12-01 DIAGNOSIS — Z95828 Presence of other vascular implants and grafts: Secondary | ICD-10-CM | POA: Insufficient documentation

## 2021-12-01 DIAGNOSIS — I739 Peripheral vascular disease, unspecified: Secondary | ICD-10-CM | POA: Insufficient documentation

## 2021-12-09 DIAGNOSIS — Z1152 Encounter for screening for COVID-19: Secondary | ICD-10-CM | POA: Diagnosis not present

## 2021-12-09 DIAGNOSIS — Z20822 Contact with and (suspected) exposure to covid-19: Secondary | ICD-10-CM | POA: Diagnosis not present

## 2021-12-09 NOTE — Telephone Encounter (Signed)
Returned call. Pt just wanted to confirm date held for 3/29 and that she was on wait list for sooner procedure if opening comes up, pt advised she is on wait list. Aware I will be in touch at later date for more review of procedure information. Patient verbalized understanding and agreeable to plan.

## 2021-12-12 DIAGNOSIS — Z1231 Encounter for screening mammogram for malignant neoplasm of breast: Secondary | ICD-10-CM | POA: Diagnosis not present

## 2021-12-14 ENCOUNTER — Other Ambulatory Visit: Payer: Self-pay | Admitting: Cardiovascular Disease

## 2021-12-15 ENCOUNTER — Telehealth: Payer: Self-pay | Admitting: Cardiovascular Disease

## 2021-12-15 MED ORDER — LISINOPRIL 20 MG PO TABS: ORAL_TABLET | ORAL | 2 refills | Status: DC

## 2021-12-15 NOTE — Telephone Encounter (Signed)
Called spoke to patient. Reviewing  patient recent hospitalization .  Lisinopril 20 mg and metoprolol succinate 25 mg discontinue  and patient to continue with metoprolol tartrate  25 mg   three times a day.   Patient states  since being out the hospital . She stopped taking metoprolol tartrate because she was no longer in afib. She states she was only to use  for short acting if in afib. Patient states DR Harl Bowie is her cardiologist. She will being having abalation in [facility] 2023.   Patient states she is taking Metoprolol succinate  12.5 mg twice a day  ( which equal 25 mg daily)  per patient , that works for me ( Dr Caryl Comes said continue while she was int the hospital )   Patient restarted taking Lisinopril  due there blood pressure increasing  Reading given to RN were    152/64 169/69  161/76   all these reading were from yesterday.  Patient states she take lisinopril 10 mg (1/2 tablet in the morning)  and 20 mg  at bedtime.  Per patient , she was doing this prior to the recent hospitalization. She states she has been doing this for years.  Patient states blood pressure has been in range with this dosing . She states her blood range is usually 948'A 165'V systolic.   RN informed patient will need to defer to Dr  Harl Bowie to refill Lisinopril.   Will contact her when RN receive a response.patient verbalized understanding.

## 2021-12-15 NOTE — Telephone Encounter (Signed)
Pt c/o medication issue:  1. Name of Medication: Lisinopril 20 mg  2. How are you currently taking this medication (dosage and times per day)? Twice a day  3. Are you having a reaction (difficulty breathing--STAT)? no  4. What is your medication issue? Patient states the medication was changed in the hospital an needs the new prescription sent to her pharmacy. She says her new pharamcy is [facility] on [address] in [place].

## 2021-12-16 ENCOUNTER — Telehealth: Payer: Self-pay | Admitting: Internal Medicine

## 2021-12-16 NOTE — Telephone Encounter (Signed)
Patient had testing done to monitor her Aorta. She was told based on the test results there was some blurry images, it may have been gas .  She did have a hernia repaired and has mesh in her abdomen. She was told that a CTA or an MRI would get a better picture.  She wanted to know what our office recommends

## 2021-12-16 NOTE — Telephone Encounter (Signed)
Please see other telephone encounter. Patient scheduled to see Dr. Harl Bowie on 2/10

## 2021-12-16 NOTE — Telephone Encounter (Signed)
Spoke to patient she stated she would like appointment to see Dr.Branch.Stated she has not seen her since she was discharged from hospital.Stated she changed care to [place].She has several questions to ask Dr.Branch about recent aorta/iliac dopplers.Appointment scheduled with Dr.Branch 2/10 at 1:40 pm.

## 2021-12-20 DIAGNOSIS — C9110 Chronic lymphocytic leukemia of B-cell type not having achieved remission: Secondary | ICD-10-CM | POA: Diagnosis not present

## 2021-12-20 DIAGNOSIS — I480 Paroxysmal atrial fibrillation: Secondary | ICD-10-CM | POA: Diagnosis not present

## 2021-12-20 DIAGNOSIS — C3432 Malignant neoplasm of lower lobe, left bronchus or lung: Secondary | ICD-10-CM | POA: Diagnosis not present

## 2021-12-21 ENCOUNTER — Telehealth: Payer: Self-pay | Admitting: Pulmonary Disease

## 2021-12-21 MED ORDER — ANORO ELLIPTA 62.5-25 MCG/ACT IN AEPB
1.0000 | INHALATION_SPRAY | Freq: Every day | RESPIRATORY_TRACT | 1 refills | Status: DC
Start: 2021-12-21 — End: 2022-02-22

## 2021-12-21 NOTE — Telephone Encounter (Signed)
I am sending in a refill to the pharmacy. Can we make a follow up for the patient with Dr. Valeta Harms or Tammy Parrett?

## 2021-12-22 ENCOUNTER — Other Ambulatory Visit: Payer: Self-pay | Admitting: Surgery

## 2021-12-22 DIAGNOSIS — Z85118 Personal history of other malignant neoplasm of bronchus and lung: Secondary | ICD-10-CM

## 2021-12-22 DIAGNOSIS — Z902 Acquired absence of lung [part of]: Secondary | ICD-10-CM

## 2021-12-23 ENCOUNTER — Encounter: Payer: Self-pay | Admitting: Internal Medicine

## 2021-12-23 ENCOUNTER — Ambulatory Visit (INDEPENDENT_AMBULATORY_CARE_PROVIDER_SITE_OTHER): Payer: Medicare Other

## 2021-12-23 ENCOUNTER — Ambulatory Visit (INDEPENDENT_AMBULATORY_CARE_PROVIDER_SITE_OTHER): Payer: Medicare Other | Admitting: Internal Medicine

## 2021-12-23 ENCOUNTER — Other Ambulatory Visit: Payer: Self-pay

## 2021-12-23 VITALS — BP 126/78 | HR 80 | Resp 20 | Ht 63.0 in | Wt 159.8 lb

## 2021-12-23 DIAGNOSIS — K661 Hemoperitoneum: Secondary | ICD-10-CM

## 2021-12-23 DIAGNOSIS — I771 Stricture of artery: Secondary | ICD-10-CM | POA: Diagnosis not present

## 2021-12-23 DIAGNOSIS — I739 Peripheral vascular disease, unspecified: Secondary | ICD-10-CM | POA: Diagnosis not present

## 2021-12-23 DIAGNOSIS — Z95828 Presence of other vascular implants and grafts: Secondary | ICD-10-CM | POA: Diagnosis not present

## 2021-12-23 DIAGNOSIS — I480 Paroxysmal atrial fibrillation: Secondary | ICD-10-CM | POA: Diagnosis not present

## 2021-12-23 DIAGNOSIS — Z5181 Encounter for therapeutic drug level monitoring: Secondary | ICD-10-CM

## 2021-12-23 LAB — POCT INR: INR: 2.9 (ref 2.0–3.0)

## 2021-12-23 MED ORDER — LISINOPRIL 20 MG PO TABS: ORAL_TABLET | ORAL | 3 refills | Status: DC

## 2021-12-23 NOTE — Patient Instructions (Signed)
TAKE 0.5 TABLET TONIGHT and then Continue warfarin 1 tablet daily except for a 1/2 on Tuesday and Saturday. Recheck INR in 4 weeks. [facility] [phone]

## 2021-12-23 NOTE — Patient Instructions (Signed)
Medication Instructions:  RESTART: Lisinopril 10mg  in the morning and 20mg  in the evening *If you need a refill on your cardiac medications before your next appointment, please call your pharmacy*   Testing/Procedures: CT ABDOMEN PELVIS WITH or WO- SOMEONE WILL CONTACT YOU TO SCHEDULE THIS AT [facility]   Follow-Up: At [facility], you and your health needs are our priority.  As part of our continuing mission to provide you with exceptional heart care, we have created designated Provider Care Teams.  These Care Teams include your primary Cardiologist (physician) and Advanced Practice Providers (APPs -  Physician Assistants and Nurse Practitioners) who all work together to provide you with the care you need, when you need it.  Your next appointment:   6 month(s)  The format for your next appointment:   In Person  Provider:   Janina Mayo, MD
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
## 2021-12-26 ENCOUNTER — Telehealth: Payer: Self-pay

## 2021-12-26 NOTE — Telephone Encounter (Signed)
Pt called in and stated that had questions regarding leqvio. Also concerned regarding heart rate being tachycardia. She was concerned as to why she was removed from our list of leqvio pts. She stated once a while back that she didn't wish to introduce any new injectable medications but since denied that statement. I offered to have her a sign up form ready for her visit with coumadin but she wants to discuss further with kristin alvstad. Routing to her.

## 2021-12-27 ENCOUNTER — Ambulatory Visit (INDEPENDENT_AMBULATORY_CARE_PROVIDER_SITE_OTHER): Payer: Medicare Other | Admitting: Internal Medicine

## 2021-12-27 ENCOUNTER — Other Ambulatory Visit: Payer: Self-pay

## 2021-12-27 ENCOUNTER — Encounter: Payer: Self-pay | Admitting: Internal Medicine

## 2021-12-27 VITALS — BP 114/94 | HR 129 | Ht 63.0 in | Wt 159.0 lb

## 2021-12-27 DIAGNOSIS — I480 Paroxysmal atrial fibrillation: Secondary | ICD-10-CM | POA: Diagnosis not present

## 2021-12-27 DIAGNOSIS — I10 Essential (primary) hypertension: Secondary | ICD-10-CM

## 2021-12-27 DIAGNOSIS — I484 Atypical atrial flutter: Secondary | ICD-10-CM

## 2021-12-27 MED ORDER — DILTIAZEM HCL ER COATED BEADS 120 MG PO CP24: 120.0000 mg | ORAL_CAPSULE | Freq: Every day | ORAL | 6 refills | Status: DC
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
## 2021-12-27 NOTE — Patient Instructions (Addendum)
Medication Instructions:  - Your physician has recommended you make the following change in your medication:   1) CHANGE Metoprolol tartrate 25 mg: - take 1 tablet by mouth TWICE daily (or every 12 hours)  2) START Diltiazem 120 mg: - take 1 capsule by mouth ONCE daily   *If you need a refill on your cardiac medications before your next appointment, please call your pharmacy*   Lab Work: - none ordered  If you have labs (blood work) drawn today and your tests are completely normal, you will receive your results only by: MyChart Message (if you have MyChart) OR A paper copy in the mail If you have any lab test that is abnormal or we need to change your treatment, we will call you to review the results.   Testing/Procedures: - none ordered   Follow-Up: At [facility], you and your health needs are our priority.  As part of our continuing mission to provide you with exceptional heart care, we have created designated Provider Care Teams.  These Care Teams include your primary Cardiologist (physician) and Advanced Practice Providers (APPs -  Physician Assistants and Nurse Practitioners) who all work together to provide you with the care you need, when you need it.  We recommend signing up for the patient portal called "MyChart".  Sign up information is provided on this After Visit Summary.  MyChart is used to connect with patients for Virtual Visits (Telemedicine).  Patients are able to view lab/test results, encounter notes, upcoming appointments, etc.  Non-urgent messages can be sent to your provider as well.   To learn more about what you can do with MyChart, go to [email or web address].    Your next appointment:   Pending your procedure with Dr. Curt Bears  The format for your next appointment:   In Person  Provider:   Virl Axe, MD    Other Instructions  Diltiazem Extended-Release Capsules or Tablets What is this medication? DILTIAZEM (dil TYE a zem) treats high  blood pressure and prevents chest pain (angina). It works by relaxing the blood vessels, which helps decrease the amount of work your heart has to do. It belongs to a group of medications called calcium channel blockers. This medicine may be used for other purposes; ask your health care provider or pharmacist if you have questions. COMMON BRAND NAME(S): Cardizem CD, Cardizem LA, Cardizem SR, Cartia XT, Dilacor XR, Dilt-CD, Diltia XT, Diltzac, Matzim LA, Rema Fendt, TIADYLT ER, Tiamate, Tiazac What should I tell my care team before I take this medication? They need to know if you have any of these conditions: Heart attack Heart disease Irregular heartbeat or rhythm Low blood pressure An unusual or allergic reaction to diltiazem, other medications, foods, dyes, or preservatives Pregnant or trying to get pregnant Breast-feeding How should I use this medication? Take this medication by mouth. Take it as directed on the prescription label at the same time every day. Do not cut, crush or chew this medication. Swallow the capsules whole. You can take it with or without food. If it upsets your stomach, take it with food. Keep taking it unless your care team tells you to stop. Talk to your care team about the use of this medication in children. Special care may be needed. Overdosage: If you think you have taken too much of this medicine contact a poison control center or emergency room at once. NOTE: This medicine is only for you. Do not share this medicine with others. What if I miss a  dose? If you miss a dose, take it as soon as you can. If it is almost time for your next dose, take only that dose. Do not take double or extra doses. What may interact with this medication? Do not take this medication with any of the following: Cisapride [place] Pimozide Ranolazine Red yeast rice This medication may also interact with the following: Buspirone Carbamazepine Cimetidine Cyclosporine Digoxin Local  anesthetics or general anesthetics Lovastatin Medications for anxiety or difficulty sleeping like midazolam and triazolam Medications for high blood pressure or heart problems Quinidine Rifampin, rifabutin, or rifapentine This list may not describe all possible interactions. Give your health care provider a list of all the medicines, herbs, non-prescription drugs, or dietary supplements you use. Also tell them if you smoke, drink alcohol, or use illegal drugs. Some items may interact with your medicine. What should I watch for while using this medication? Visit your care team for regular checks on your progress. Check your blood pressure as directed. Ask your care team what your blood pressure should be. Do not treat yourself for coughs, colds, or pain while you are using this medication without asking your care team for advice. Some medications may increase your blood pressure. This medication may cause serious skin reactions. They can happen weeks to months after starting the medication. Contact your care team right away if you notice fevers or flu-like symptoms with a rash. The rash may be red or purple and then turn into blisters or peeling of the skin. Or, you might notice a red rash with swelling of the face, lips or lymph nodes in your neck or under your arms. You may get drowsy or dizzy. Do not drive, use machinery, or do anything that needs mental alertness until you know how this medication affects you. Do not stand up or sit up quickly, especially if you are an older patient. This reduces the risk of dizzy or fainting spells. What side effects may I notice from receiving this medication? Side effects that you should report to your care team as soon as possible: Allergic reactions--skin rash, itching, hives, swelling of the face, lips, tongue, or throat Heart failure--shortness of breath, swelling of the ankles, feet, or hands, sudden weight gain, unusual weakness or fatigue Slow  heartbeat--dizziness, feeling faint or lightheaded, trouble breathing, unusual weakness or fatigue Liver injury--right upper belly pain, loss of appetite, nausea, light-colored stool, dark yellow or brown urine, yellowing skin or eyes, unusual weakness or fatigue Low blood pressure--dizziness, feeling faint or lightheaded, blurry vision Redness, blistering, peeling, or loosening of the skin, including inside the mouth Side effects that usually do not require medical attention (report to your care team if they continue or are bothersome): Constipation Facial flushing, redness Headache This list may not describe all possible side effects. Call your doctor for medical advice about side effects. You may report side effects to FDA at 1-800-FDA-1088. Where should I keep my medication? Keep out of the reach of children and pets. Store at room temperature between 20 and 25 degrees C (68 and 77 degrees F). Protect from moisture. Keep the container tightly closed. Throw away any unused medication after the expiration date. NOTE: This sheet is a summary. It may not cover all possible information. If you have questions about this medicine, talk to your doctor, pharmacist, or health care provider.  2022 Elsevier/Gold Standard (2021-07-19 00:00:00)

## 2021-12-27 NOTE — Telephone Encounter (Signed)
Patient is scheduled for 01/18/2022 at 3:45pm with Dr. Valeta Harms. Nothing further needed.

## 2021-12-27 NOTE — Telephone Encounter (Signed)
Spoke with patient.  She wants to proceed with Leqvio, but not until after she gets ablation, tentatively scheduled for March.  Advised patient to let us know when she is ready, can let us know when she comes in for an INR check.  Patient voiced understanding.

## 2021-12-28 ENCOUNTER — Telehealth: Payer: Self-pay | Admitting: Internal Medicine

## 2021-12-28 NOTE — Telephone Encounter (Signed)
Called patient, she was just questioning when the CTA would be scheduled.  Advised she should be getting a call soon, this can take time.   Patient also wanted Dr.Branch to know that she is taking Imdur 30 mg (twice daily) not once daily as prescribed. She has been meaning to tell Dr.Branch at each visit but forgets to mention- she states Dr.Kelly increased it a while back. (I was unable to find record of this) however, she was advised that it should be once daily but I would message Dr.Branch to see what she recommends.   Thanks!

## 2021-12-28 NOTE — Telephone Encounter (Signed)
Calling to speak the nurse bout changes that were made at her last appt. Please advise

## 2021-12-29 ENCOUNTER — Encounter: Payer: Self-pay | Admitting: Internal Medicine

## 2021-12-31 ENCOUNTER — Other Ambulatory Visit: Payer: Self-pay | Admitting: Cardiovascular Disease

## 2021-12-31 ENCOUNTER — Other Ambulatory Visit: Payer: Self-pay | Admitting: Internal Medicine

## 2022-01-02 ENCOUNTER — Encounter: Payer: Self-pay | Admitting: Internal Medicine

## 2022-01-03 ENCOUNTER — Other Ambulatory Visit: Payer: Self-pay | Admitting: *Deleted

## 2022-01-03 ENCOUNTER — Other Ambulatory Visit (HOSPITAL_COMMUNITY): Payer: Self-pay | Admitting: Cardiovascular Disease

## 2022-01-03 ENCOUNTER — Telehealth: Payer: Self-pay | Admitting: Internal Medicine

## 2022-01-03 ENCOUNTER — Telehealth: Payer: Self-pay | Admitting: Cardiology

## 2022-01-03 DIAGNOSIS — I739 Peripheral vascular disease, unspecified: Secondary | ICD-10-CM

## 2022-01-03 NOTE — Telephone Encounter (Signed)
Patient is calling because she still hasn't heard about scheduling her CTA of her Abdomen.  She really needs to speak to nurse for a clarification on the way it was going to be submitted.

## 2022-01-03 NOTE — Telephone Encounter (Signed)
She said she couldn't sent message on MyChart as he is not listed as one of her doctor's.

## 2022-01-03 NOTE — Telephone Encounter (Signed)
Left message to call back  

## 2022-01-03 NOTE — Telephone Encounter (Signed)
Spoke with patient and asked her if she wanted me to have central scheduling call her or if she wanted the number. She wanted to call them, so I gave her the number.

## 2022-01-04 DIAGNOSIS — I739 Peripheral vascular disease, unspecified: Secondary | ICD-10-CM

## 2022-01-04 NOTE — Telephone Encounter (Addendum)
Pt would like some hospital notes revised as some of the statements made in notes are incorrect, such as she walks mile and half per day or that she is a current smoker.  Pt aware I will send her a mychart message where she can list the discrepancies she would like corrected and then will forward to MD for review.  Verified ablation date is still held for 3/29 and will be in touch.  Patient verbalized understanding and agreeable to plan.

## 2022-01-04 NOTE — Telephone Encounter (Signed)
Patient returning call.

## 2022-01-10 DIAGNOSIS — M3501 Sicca syndrome with keratoconjunctivitis: Secondary | ICD-10-CM | POA: Diagnosis not present

## 2022-01-10 DIAGNOSIS — Z961 Presence of intraocular lens: Secondary | ICD-10-CM | POA: Diagnosis not present

## 2022-01-11 ENCOUNTER — Ambulatory Visit (HOSPITAL_COMMUNITY)
Admission: RE | Admit: 2022-01-11 | Discharge: 2022-01-11 | Disposition: A | Discharge status: Home / Self Care | Payer: Medicare Other | Source: Ambulatory Visit | Attending: Internal Medicine | Admitting: Internal Medicine

## 2022-01-11 ENCOUNTER — Ambulatory Visit (INDEPENDENT_AMBULATORY_CARE_PROVIDER_SITE_OTHER): Payer: Medicare Other

## 2022-01-11 ENCOUNTER — Other Ambulatory Visit: Payer: Self-pay

## 2022-01-11 DIAGNOSIS — Z5181 Encounter for therapeutic drug level monitoring: Secondary | ICD-10-CM

## 2022-01-11 DIAGNOSIS — K661 Hemoperitoneum: Secondary | ICD-10-CM | POA: Insufficient documentation

## 2022-01-11 DIAGNOSIS — I480 Paroxysmal atrial fibrillation: Secondary | ICD-10-CM | POA: Diagnosis not present

## 2022-01-11 DIAGNOSIS — I771 Stricture of artery: Secondary | ICD-10-CM | POA: Insufficient documentation

## 2022-01-11 DIAGNOSIS — J90 Pleural effusion, not elsewhere classified: Secondary | ICD-10-CM | POA: Diagnosis not present

## 2022-01-11 DIAGNOSIS — I739 Peripheral vascular disease, unspecified: Secondary | ICD-10-CM | POA: Diagnosis not present

## 2022-01-11 DIAGNOSIS — Z95828 Presence of other vascular implants and grafts: Secondary | ICD-10-CM | POA: Diagnosis not present

## 2022-01-11 LAB — POCT INR: INR: 2.5 (ref 2.0–3.0)

## 2022-01-11 LAB — POCT I-STAT CREATININE: Creatinine, Ser: 0.7 mg/dL (ref 0.44–1.00)

## 2022-01-11 MED ORDER — SODIUM CHLORIDE (PF) 0.9 % IJ SOLN
INTRAMUSCULAR | Status: AC
  Filled 2022-01-11: qty 50

## 2022-01-11 MED ORDER — IOHEXOL 350 MG/ML SOLN
100.0000 mL | Freq: Once | INTRAVENOUS | Status: AC | PRN
  Administered 2022-01-11: 100 mL via INTRAVENOUS

## 2022-01-11 NOTE — Patient Instructions (Signed)
Continue warfarin 1 tablet daily except for a 1/2 on Tuesday and Saturday. Recheck INR in 1 week. [facility] [phone];  Avoid greens. ?

## 2022-01-12 ENCOUNTER — Encounter: Payer: Self-pay | Admitting: Internal Medicine

## 2022-01-12 NOTE — Telephone Encounter (Signed)
Called back to discuss CT results ?

## 2022-01-12 NOTE — Telephone Encounter (Signed)
Per Dr. Harl Bowie- she called and spoke with patient regarding results.  ?

## 2022-01-16 ENCOUNTER — Other Ambulatory Visit: Payer: Self-pay | Admitting: Cardiovascular Disease

## 2022-01-18 ENCOUNTER — Other Ambulatory Visit: Payer: Self-pay

## 2022-01-18 ENCOUNTER — Encounter: Payer: Self-pay | Admitting: Pulmonary Disease

## 2022-01-18 ENCOUNTER — Ambulatory Visit (INDEPENDENT_AMBULATORY_CARE_PROVIDER_SITE_OTHER): Payer: Medicare Other

## 2022-01-18 ENCOUNTER — Ambulatory Visit (INDEPENDENT_AMBULATORY_CARE_PROVIDER_SITE_OTHER): Payer: Medicare Other | Admitting: Pulmonary Disease

## 2022-01-18 VITALS — BP 128/72 | HR 57 | Temp 97.7°F | Ht 63.0 in | Wt 159.8 lb

## 2022-01-18 DIAGNOSIS — I480 Paroxysmal atrial fibrillation: Secondary | ICD-10-CM

## 2022-01-18 DIAGNOSIS — Z85118 Personal history of other malignant neoplasm of bronchus and lung: Secondary | ICD-10-CM | POA: Diagnosis not present

## 2022-01-18 DIAGNOSIS — Z902 Acquired absence of lung [part of]: Secondary | ICD-10-CM | POA: Diagnosis not present

## 2022-01-18 DIAGNOSIS — J432 Centrilobular emphysema: Secondary | ICD-10-CM | POA: Diagnosis not present

## 2022-01-18 DIAGNOSIS — Z5181 Encounter for therapeutic drug level monitoring: Secondary | ICD-10-CM

## 2022-01-18 DIAGNOSIS — J449 Chronic obstructive pulmonary disease, unspecified: Secondary | ICD-10-CM | POA: Diagnosis not present

## 2022-01-18 DIAGNOSIS — I5022 Chronic systolic (congestive) heart failure: Secondary | ICD-10-CM | POA: Diagnosis not present

## 2022-01-18 LAB — POCT INR: INR: 4.6 — AB (ref 2.0–3.0)

## 2022-01-18 NOTE — Patient Instructions (Signed)
Thank you for visiting Dr. Valeta Harms at [facility]. ?Today we recommend the following: ? ?Continue Anoro daily  ?Albuterol as needed  ? ?Return in about 1 year (around 01/19/2023), or if symptoms worsen or fail to improve, for with APP or Dr. Valeta Harms. ? ? ? ?Please do your part to reduce the spread of COVID-19.  ? ?

## 2022-01-18 NOTE — Patient Instructions (Signed)
HOLD TODAY ONLY and then Continue warfarin 1 tablet daily except for a 1/2 on Tuesday and Saturday. Recheck INR in 1 week. [facility] [phone];   ?
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
## 2022-01-24 ENCOUNTER — Telehealth: Payer: Self-pay | Admitting: Internal Medicine

## 2022-01-24 MED ORDER — ROSUVASTATIN CALCIUM 20 MG PO TABS
20.0000 mg | ORAL_TABLET | Freq: Every day | ORAL | 3 refills | Status: DC
Start: 2022-01-24 — End: 2023-04-16

## 2022-01-24 NOTE — Telephone Encounter (Signed)
?*  STAT* If patient is at the pharmacy, call can be transferred to refill team. ? ? ?1. Which medications need to be refilled? (please list name of each medication and dose if known) rosuvastatin (CRESTOR) 20 MG tablet ? ?2. Which pharmacy/location (including street and city if local pharmacy) is medication to be sent to? [facility] ([facility] [place] [facility] [place] ? ?3. Do they need a 30 day or 90 day supply?  90 day ? ?Has two pills left.  ?

## 2022-01-24 NOTE — Telephone Encounter (Signed)
RX SENT

## 2022-01-25 ENCOUNTER — Other Ambulatory Visit: Payer: Self-pay

## 2022-01-25 ENCOUNTER — Ambulatory Visit (INDEPENDENT_AMBULATORY_CARE_PROVIDER_SITE_OTHER): Payer: Medicare Other

## 2022-01-25 DIAGNOSIS — I480 Paroxysmal atrial fibrillation: Secondary | ICD-10-CM

## 2022-01-25 DIAGNOSIS — Z5181 Encounter for therapeutic drug level monitoring: Secondary | ICD-10-CM | POA: Diagnosis not present

## 2022-01-25 LAB — POCT INR: INR: 2.7 (ref 2.0–3.0)

## 2022-01-25 NOTE — Patient Instructions (Signed)
Continue warfarin 1 tablet daily except for a 1/2 on Tuesday and Saturday. Recheck INR in 1 week. [facility] [phone];  Ablation 3/29 ?

## 2022-01-26 ENCOUNTER — Encounter: Payer: Self-pay | Admitting: Internal Medicine

## 2022-01-26 ENCOUNTER — Telehealth: Payer: Self-pay | Admitting: Internal Medicine
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
## 2022-01-26 NOTE — Telephone Encounter (Signed)
Spoke with the patient who has a lot of questions about her CT scan. She would like to discuss some of these findings with Dr. [name] who she will see on Wednesday. I advised her that he will be able to see the results of her test in her chart. Advised patient to bring her concerns to her appointment with Dr. Harl Bowie on Monday 3/20 to discuss. She is going to call Dr. Vivi Martens office as well.  ?

## 2022-01-26 NOTE — Telephone Encounter (Signed)
Patient is requesting to speak with Dr. Nelly Laurence nurse. She states she discussed being referred to a vascular surgeon in the past and she will be seeing a thoracic surgeon on 3/22. She would like to know if this is something that needs to be discussed with the thoracic surgeon. Patient requesting a call to discuss in further detail.  ?

## 2022-01-27 ENCOUNTER — Other Ambulatory Visit: Payer: Self-pay

## 2022-01-27 ENCOUNTER — Encounter: Payer: Self-pay | Admitting: Vascular Surgery

## 2022-01-27 ENCOUNTER — Ambulatory Visit (INDEPENDENT_AMBULATORY_CARE_PROVIDER_SITE_OTHER): Payer: Medicare Other | Admitting: Vascular Surgery

## 2022-01-27 VITALS — BP 132/73 | HR 53 | Temp 97.8°F | Resp 20 | Ht 63.0 in | Wt 160.0 lb

## 2022-01-27 DIAGNOSIS — I708 Atherosclerosis of other arteries: Secondary | ICD-10-CM | POA: Diagnosis not present

## 2022-01-30 ENCOUNTER — Encounter: Payer: Self-pay | Admitting: Internal Medicine

## 2022-01-30 ENCOUNTER — Ambulatory Visit (INDEPENDENT_AMBULATORY_CARE_PROVIDER_SITE_OTHER): Payer: Medicare Other | Admitting: Internal Medicine

## 2022-01-30 ENCOUNTER — Other Ambulatory Visit: Payer: Self-pay

## 2022-01-30 ENCOUNTER — Telehealth: Payer: Self-pay

## 2022-01-30 VITALS — BP 144/68 | HR 61 | Ht 63.0 in | Wt 159.0 lb

## 2022-01-30 DIAGNOSIS — I480 Paroxysmal atrial fibrillation: Secondary | ICD-10-CM

## 2022-01-30 DIAGNOSIS — I739 Peripheral vascular disease, unspecified: Secondary | ICD-10-CM | POA: Diagnosis not present

## 2022-01-30 MED ORDER — OMEGA-3-ACID ETHYL ESTERS 1 G PO CAPS: 1.0000 g | ORAL_CAPSULE | Freq: Two times a day (BID) | ORAL | 6 refills | Status: DC
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
## 2022-01-30 NOTE — Patient Instructions (Addendum)
Medication Instructions:  ?START: LOVAZA- 1gram TWICE DAILY  ?*If you need a refill on your cardiac medications before your next appointment, please call your pharmacy* ? ?Follow-Up: ?At [facility], you and your health needs are our priority.  As part of our continuing mission to provide you with exceptional heart care, we have created designated Provider Care Teams.  These Care Teams include your primary Cardiologist (physician) and Advanced Practice Providers (APPs -  Physician Assistants and Nurse Practitioners) who all work together to provide you with the care you need, when you need it. ? ?Your next appointment:   ?6 month(s) ? ?The format for your next appointment:   ?In Person ? ?Provider:   ?Janina Mayo, MD   ?

## 2022-01-30 NOTE — Telephone Encounter (Signed)
Patient contacted the office Thursday, 3/16 requesting she speak with Dr. [name] in regards to a recent finding of "severely enlarged atrium" on her ECHO that was done. She states that she is to have a cardiac ablation done soon and she did not know if it would be safe to have this done knowing of her enlarged atrium. She wanted to know if this is any added risk and what could be done.  ? ?Advised patient that it is best to speak with her Cardiologist at her new patient appointment for further evaluation and possible treatment of her new health findings. If needed, Dr. [name] could be consulted. She acknowledged receipt.  ?

## 2022-01-30 NOTE — Telephone Encounter (Signed)
Patient wishing to switch over PV care from Dr. Gwenlyn Found to Dr. Fletcher Anon- Will forward to MD's for review.  ?

## 2022-01-31 ENCOUNTER — Telehealth: Payer: Self-pay

## 2022-01-31 ENCOUNTER — Telehealth: Payer: Self-pay | Admitting: *Deleted

## 2022-01-31 ENCOUNTER — Encounter: Payer: Self-pay | Admitting: Internal Medicine

## 2022-01-31 DIAGNOSIS — I480 Paroxysmal atrial fibrillation: Secondary | ICD-10-CM

## 2022-01-31 MED ORDER — OMEGA-3-ACID ETHYL ESTERS 1 G PO CAPS: 1.0000 g | ORAL_CAPSULE | Freq: Two times a day (BID) | ORAL | 6 refills | Status: DC

## 2022-01-31 NOTE — Telephone Encounter (Signed)
The pt called in and stated that she had some concerns regarding a prescription. She didn't indicate which one but upon looking in her chart she has been inquiring about lovaza and asked to speak to kristin alvstad rph so I will route to her.  ?

## 2022-01-31 NOTE — Telephone Encounter (Signed)
Spent 30 minutes on the phone with pt. ?Aware I will send procedure instructions via mychart ?Pt understands blood work will be performed at [place]. ?Patient verbalized understanding and agreeable to plan.  ? ?

## 2022-01-31 NOTE — Progress Notes (Deleted)
? ?   ?[address] ?      [place] [address] ?            [phone]   ? ?   ?HPI: ?This is a 76 year old female with a past medical history of remote tobacco abuse, squamous cell carcinoma of LLL T1b, N0, M0 with lymphovascular  ?invasion but negative resection margins. (s/p left muscle sparing thoracotomy, LLL by Dr. [name] on 07/02/2017), PVD, seizures, Sjogrens, hyperlipidemia, ?COPD, a flutter (s/p DCCV 2018), and emergent CABG x 3 08' by Dr. [name], and CLL (early stage and does not require any treatment). She states she fatigue from CLL, but has no other complaints today. ?She had atrial fibrillation in this past December. She is going to have an ablation next Wednesday.  ?She presents today, however,  for further lung cancer surveillance. ? ?Current Outpatient Medications  ?Medication Sig Dispense Refill  ? acetaminophen (TYLENOL) 500 MG tablet Take 1,000 mg by mouth every 8 (eight) hours as needed (pain.).     ? albuterol (VENTOLIN HFA) 108 (90 Base) MCG/ACT inhaler Inhale 2 puffs into the lungs every 6 (six) hours as needed for wheezing or shortness of breath. 8 g 5  ? Calcium Carb-Cholecalciferol (CALTRATE 600+D3 PO) Take 1 tablet by mouth daily at 12 noon.    ? Carboxymethylcellulose Sodium (THERATEARS) 0.25 % SOLN Place 1 drop into both eyes 3 (three) times daily.    ? Cholecalciferol (CVS D3) 25 MCG (1000 UT) capsule Take 1,000 Units by mouth daily.    ? diltiazem (CARDIZEM CD) 120 MG 24 hr capsule Take 1 capsule (120 mg total) by mouth daily. 30 capsule 6  ? ezetimibe (ZETIA) 10 MG tablet Take 1 tablet (10 mg total) by mouth daily. 90 tablet 1  ? hydrocortisone valerate cream (WESTCORT) 0.2 % Apply 1 application topically as needed (for eczema).    ? isosorbide mononitrate (IMDUR) 30 MG 24 hr tablet Take 1 tablet by mouth once daily 30 tablet 3  ? lisinopril (ZESTRIL) 20 MG tablet Take 10 mg in the morning and 20 mg in the  bedtime 35 tablet 3  ? metoprolol tartrate (LOPRESSOR) 25  MG tablet 12.5 mg. Take 1 tablet (25 mg) by mouth twice daily as needed for heart rates > 100    ? nitroGLYCERIN (NITROSTAT) 0.4 MG SL tablet Place 1 tablet (0.4 mg total) under the tongue every 5 (five) minutes x 3 doses as needed for chest pain. 25 tablet 2  ? omega-3 acid ethyl esters (LOVAZA) 1 g capsule Take 1 capsule (1 g total) by mouth 2 (two) times daily. 60 capsule 6  ? pyridOXINE (VITAMIN B-6) 100 MG tablet Take 100 mg by mouth daily at 12 noon.    ? rosuvastatin (CRESTOR) 20 MG tablet Take 1 tablet (20 mg total) by mouth daily. 180 tablet 3  ? umeclidinium-vilanterol (ANORO ELLIPTA) 62.5-25 MCG/ACT AEPB Inhale 1 puff into the lungs daily. 60 each 1  ? valACYclovir (VALTREX) 500 MG tablet Take 500 mg by mouth daily.    ? vitamin B-12 (CYANOCOBALAMIN) 500 MCG tablet Take 1,000 mcg by mouth daily at 12 noon.    ? warfarin (COUMADIN) 2.5 MG tablet TAKE ONE TABLET BY MOUTH ONCE DAILY OR AS DIRECTED 30 tablet 0  ?Vital Signs: ?Vitals:  ? 02/01/22 1359  ?BP: (!) 104/58  ?Pulse: 61  ?Resp: 20  ?SpO2: 92%  ?  ?Physical Exam: ?CV-RRR, no murmur ?Pulmonary-Clear to auscultation bilaterally ?Extremities-no LE  edema ? ?Diagnostic Tests: ?CLINICAL DATA:  Follow-up lung cancer. Status post left lower lobectomy. History of CLL. ?  ?EXAM: ?CT CHEST WITHOUT CONTRAST ?  ?TECHNIQUE: ?Multidetector CT imaging of the chest was performed following the standard protocol without IV contrast. ?  ?RADIATION DOSE REDUCTION: This exam was performed according to the departmental dose-optimization program which includes automated ?exposure control, adjustment of the mA and/or kV according to patient size and/or use of iterative reconstruction technique. ?  ?COMPARISON:  07/20/2021 and 07/21/2020. ?  ?FINDINGS: ?Cardiovascular: Normal caliber of the thoracic aorta with diffuse calcifications. Post CABG changes. Native coronary arteries are heavily calcified. Heart size is normal without significant pericardial fluid. ?   ?Mediastinum/Nodes: Small right supraclavicular lymph nodes have not significantly changed. Structure in the right lower neck on sequence 2 image 3 likely represents a vascular structure based on previous ?cervical spine CT. There may be a stable 6 mm low-density right thyroid nodule that does not meet criteria for additional imaging. ?Subcarinal tissue measures 9 mm in the short axis and stable. No significant lymph node enlargement in the chest. Slightly prominent ?left axillary lymph nodes are stable. Index left axillary lymph node measures 9 mm in short axis on sequence 2 image 35 and stable. Small right axillary lymph nodes are similar to the previous examination. ?Again noted is a small hiatal hernia. ?  ?Lungs/Pleura: Centrilobular emphysema. Chronic small left pleural effusion which has minimally changed in size. Subtle peripheral nodule in the right upper lobe on series 8, image 40 measures ?approximately 2 mm and this is stable since 07/21/2020. Additional punctate peripheral nodule in the right upper lobe on series 8, image 32 is stable since 2021. Postsurgical changes from a left ?lower lobectomy. Stable scarring at the lateral left lung base. No new airspace disease or lung consolidation. No new suspicious pulmonary nodules. ?  ?Upper Abdomen: Small hypodensity in the anterior left hepatic lobe likely represents a cyst. Stable mild fullness in left renal pelvis. ?No acute upper abdominal findings. ?  ?Musculoskeletal: No acute or suspicious osseous abnormalities. ?  ?IMPRESSION: ?1. No acute chest abnormality. ?2. No change in the tiny pulmonary nodules in the right upper lung. ?These nodules are stable since 2021. ?3. Stable postoperative changes from a left lower lobectomy. Chronic ?small left pleural effusion has minimally changed. ?4. Aortic Atherosclerosis (ICD10-I70.0) and Emphysema (ICD10-J43.9). ?5. Small hiatal hernia. ?  ?  ?Electronically Signed ?  By: Markus Daft M.D. ?  On: 02/01/2022  13:00 ? ?Impression and Plan: ?She is doing well 4 1/2  years following her surgery. We discussed the findings of the CT scan of the chest which shows no change in the tiny pulmonary nodules in the right upper lung (stable since ?2021).  She has a chronic, small left pleural effusion that is minimally changed. She has aortic atherosclerosis, emphysema, and a small hiatal hernia. ?As discussed with Dr. [name], patient will follow up with CT of the chest in one year for further lung cancer surveillance. 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 As discussed with surgeon, then conveyed to patient, this is not something that requires surgical intervention. She has changed cardiologist's from Dr. Claiborne Billings to Dr. Phineas Inches. ? ? ?Nani Skillern, PA-C ?[facility] ?[phone] ? ?

## 2022-02-01 ENCOUNTER — Ambulatory Visit (INDEPENDENT_AMBULATORY_CARE_PROVIDER_SITE_OTHER): Payer: Medicare Other | Admitting: Physician Assistant

## 2022-02-01 ENCOUNTER — Other Ambulatory Visit: Payer: Self-pay

## 2022-02-01 ENCOUNTER — Ambulatory Visit
Admission: RE | Admit: 2022-02-01 | Discharge: 2022-02-01 | Disposition: A | Discharge status: Home / Self Care | Payer: Medicare Other | Source: Ambulatory Visit | Attending: Surgery | Admitting: Surgery

## 2022-02-01 VITALS — BP 104/58 | HR 61 | Resp 20 | Ht 63.0 in | Wt 158.0 lb

## 2022-02-01 DIAGNOSIS — C3490 Malignant neoplasm of unspecified part of unspecified bronchus or lung: Secondary | ICD-10-CM | POA: Diagnosis not present

## 2022-02-01 DIAGNOSIS — Z85118 Personal history of other malignant neoplasm of bronchus and lung: Secondary | ICD-10-CM

## 2022-02-01 DIAGNOSIS — J439 Emphysema, unspecified: Secondary | ICD-10-CM | POA: Diagnosis not present

## 2022-02-01 DIAGNOSIS — Z902 Acquired absence of lung [part of]: Secondary | ICD-10-CM

## 2022-02-01 DIAGNOSIS — J90 Pleural effusion, not elsewhere classified: Secondary | ICD-10-CM | POA: Diagnosis not present

## 2022-02-01 NOTE — Patient Instructions (Signed)
Patient will return to have CT scan of chest again in one year ?

## 2022-02-01 NOTE — Progress Notes (Signed)
? ?   ?[name] ?      [place] [address] ?            [phone]   ? ?   ?HPI: ?This is a 76 year old female with a past medical history of remote tobacco abuse, squamous cell carcinoma of LLL T1b, N0, M0 with lymphovascular invasion but negative resection margins. (s/p left muscle sparing thoracotomy, LLL by Dr. [name] on 07/02/2017), PVD, seizures, Sjogrens, hyperlipidemia, ?COPD, a flutter (s/p DCCV 2018), and emergent CABG x 3 08' by Dr. [name], and CLL (early stage and does not require any treatment). She states she fatigue from CLL, but has no other complaints today. ?She had atrial fibrillation in this past December. She is going to have an ablation next Wednesday.  ?She presents today, however,  for further lung cancer surveillance. ? ?Current Outpatient Medications  ?Medication Sig Dispense Refill  ? acetaminophen (TYLENOL) 500 MG tablet Take 1,000 mg by mouth every 8 (eight) hours as needed (pain.).     ? albuterol (VENTOLIN HFA) 108 (90 Base) MCG/ACT inhaler Inhale 2 puffs into the lungs every 6 (six) hours as needed for wheezing or shortness of breath. 8 g 5  ? Calcium Carb-Cholecalciferol (CALTRATE 600+D3 PO) Take 1 tablet by mouth daily at 12 noon.    ? Carboxymethylcellulose Sodium (THERATEARS) 0.25 % SOLN Place 1 drop into both eyes 3 (three) times daily.    ? Cholecalciferol (CVS D3) 25 MCG (1000 UT) capsule Take 1,000 Units by mouth daily.    ? diltiazem (CARDIZEM CD) 120 MG 24 hr capsule Take 1 capsule (120 mg total) by mouth daily. 30 capsule 6  ? ezetimibe (ZETIA) 10 MG tablet Take 1 tablet (10 mg total) by mouth daily. 90 tablet 1  ? hydrocortisone valerate cream (WESTCORT) 0.2 % Apply 1 application topically as needed (for eczema).    ? isosorbide mononitrate (IMDUR) 30 MG 24 hr tablet Take 1 tablet by mouth once daily 30 tablet 3  ? lisinopril (ZESTRIL) 20 MG tablet Take 10 mg in the morning and 20 mg in the  bedtime 35 tablet 3  ? metoprolol tartrate (LOPRESSOR) 25  MG tablet 12.5 mg. Take 1 tablet (25 mg) by mouth twice daily as needed for heart rates > 100    ? nitroGLYCERIN (NITROSTAT) 0.4 MG SL tablet Place 1 tablet (0.4 mg total) under the tongue every 5 (five) minutes x 3 doses as needed for chest pain. 25 tablet 2  ? omega-3 acid ethyl esters (LOVAZA) 1 g capsule Take 1 capsule (1 g total) by mouth 2 (two) times daily. 60 capsule 6  ? pyridOXINE (VITAMIN B-6) 100 MG tablet Take 100 mg by mouth daily at 12 noon.    ? rosuvastatin (CRESTOR) 20 MG tablet Take 1 tablet (20 mg total) by mouth daily. 180 tablet 3  ? umeclidinium-vilanterol (ANORO ELLIPTA) 62.5-25 MCG/ACT AEPB Inhale 1 puff into the lungs daily. 60 each 1  ? valACYclovir (VALTREX) 500 MG tablet Take 500 mg by mouth daily.    ? vitamin B-12 (CYANOCOBALAMIN) 500 MCG tablet Take 1,000 mcg by mouth daily at 12 noon.    ? warfarin (COUMADIN) 2.5 MG tablet TAKE ONE TABLET BY MOUTH ONCE DAILY OR AS DIRECTED 30 tablet 0  ?Vital Signs: ?Vitals:  ? 02/01/22 1359  ?BP: (!) 104/58  ?Pulse: 61  ?Resp: 20  ?SpO2: 92%  ?  ?Physical Exam: ?CV-RRR, no murmur ?Pulmonary-Clear to auscultation bilaterally ?Extremities-no LE edema ? ?  Diagnostic Tests: ?CLINICAL DATA:  Follow-up lung cancer. Status post left lower lobectomy. History of CLL. ?  ?EXAM: ?CT CHEST WITHOUT CONTRAST ?  ?TECHNIQUE: ?Multidetector CT imaging of the chest was performed following the standard protocol without IV contrast. ?  ?RADIATION DOSE REDUCTION: This exam was performed according to the departmental dose-optimization program which includes automated ?exposure control, adjustment of the mA and/or kV according to patient size and/or use of iterative reconstruction technique. ?  ?COMPARISON:  07/20/2021 and 07/21/2020. ?  ?FINDINGS: ?Cardiovascular: Normal caliber of the thoracic aorta with diffuse calcifications. Post CABG changes. Native coronary arteries are heavily calcified. Heart size is normal without significant pericardial fluid. ?   ?Mediastinum/Nodes: Small right supraclavicular lymph nodes have not significantly changed. Structure in the right lower neck on sequence 2 image 3 likely represents a vascular structure based on previous ?cervical spine CT. There may be a stable 6 mm low-density right thyroid nodule that does not meet criteria for additional imaging. ?Subcarinal tissue measures 9 mm in the short axis and stable. No significant lymph node enlargement in the chest. Slightly prominent ?left axillary lymph nodes are stable. Index left axillary lymph node measures 9 mm in short axis on sequence 2 image 35 and stable. Small right axillary lymph nodes are similar to the previous examination. ?Again noted is a small hiatal hernia. ?  ?Lungs/Pleura: Centrilobular emphysema. Chronic small left pleural effusion which has minimally changed in size. Subtle peripheral nodule in the right upper lobe on series 8, image 40 measures ?approximately 2 mm and this is stable since 07/21/2020. Additional punctate peripheral nodule in the right upper lobe on series 8, image 32 is stable since 2021. Postsurgical changes from a left ?lower lobectomy. Stable scarring at the lateral left lung base. No new airspace disease or lung consolidation. No new suspicious pulmonary nodules. ?  ?Upper Abdomen: Small hypodensity in the anterior left hepatic lobe likely represents a cyst. Stable mild fullness in left renal pelvis. ?No acute upper abdominal findings. ?  ?Musculoskeletal: No acute or suspicious osseous abnormalities. ?  ?IMPRESSION: ?1. No acute chest abnormality. ?2. No change in the tiny pulmonary nodules in the right upper lung. ?These nodules are stable since 2021. ?3. Stable postoperative changes from a left lower lobectomy. Chronic ?small left pleural effusion has minimally changed. ?4. Aortic Atherosclerosis (ICD10-I70.0) and Emphysema (ICD10-J43.9). ?5. Small hiatal hernia. ?  ?  ?Electronically Signed ?  By: Markus Daft M.D. ?  On: 02/01/2022  13:00 ? ?Impression and Plan: ?She is doing well 4 1/2  years following her surgery. We discussed the findings of the CT scan of the chest which shows no change in the tiny pulmonary nodules in the right upper lung (stable since ?2021).  She has a chronic, small left pleural effusion that is minimally changed. She has aortic atherosclerosis, emphysema, and a small hiatal hernia. ?As discussed with Dr. [name], patient will follow up with CT of the chest in one year for further lung cancer surveillance. 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 As discussed with surgeon, then conveyed to patient, this is not something that requires surgical intervention. She has changed cardiologist's from Dr. Claiborne Billings to Dr. Phineas Inches. ? ? ?Bridget Skillern, PA-C ?[facility] ?[phone] ? ?

## 2022-02-01 NOTE — Telephone Encounter (Signed)
Returned call to patient.  She wanted to know about using OTC fish oil to prevent dry eyes.  Concerned because heard it increased risk of AF and she is scheduled for ablation in the near future.  Advised to hold off for now and review with MD after procedure.  ?

## 2022-02-01 NOTE — Telephone Encounter (Signed)
Fine

## 2022-02-02 ENCOUNTER — Ambulatory Visit (INDEPENDENT_AMBULATORY_CARE_PROVIDER_SITE_OTHER): Payer: Medicare Other | Admitting: Cardiology

## 2022-02-02 ENCOUNTER — Encounter: Payer: Self-pay | Admitting: Cardiology

## 2022-02-02 ENCOUNTER — Telehealth (HOSPITAL_COMMUNITY): Payer: Self-pay | Admitting: *Deleted

## 2022-02-02 ENCOUNTER — Ambulatory Visit (INDEPENDENT_AMBULATORY_CARE_PROVIDER_SITE_OTHER): Payer: Medicare Other

## 2022-02-02 VITALS — BP 130/92 | HR 129 | Ht 63.0 in | Wt 158.6 lb

## 2022-02-02 DIAGNOSIS — Z01812 Encounter for preprocedural laboratory examination: Secondary | ICD-10-CM | POA: Diagnosis not present

## 2022-02-02 DIAGNOSIS — Z5181 Encounter for therapeutic drug level monitoring: Secondary | ICD-10-CM | POA: Diagnosis not present

## 2022-02-02 DIAGNOSIS — I480 Paroxysmal atrial fibrillation: Secondary | ICD-10-CM

## 2022-02-02 LAB — BASIC METABOLIC PANEL
BUN/Creatinine Ratio: 25 (ref 12–28)
BUN: 17 mg/dL (ref 8–27)
CO2: 26 mmol/L (ref 20–29)
Calcium: 9.5 mg/dL (ref 8.7–10.3)
Chloride: 101 mmol/L (ref 96–106)
Creatinine, Ser: 0.67 mg/dL (ref 0.57–1.00)
Glucose: 97 mg/dL (ref 70–99)
Potassium: 4.4 mmol/L (ref 3.5–5.2)
Sodium: 139 mmol/L (ref 134–144)
eGFR: 90 mL/min/{1.73_m2} (ref >59)

## 2022-02-02 LAB — CBC
Hematocrit: 43.4 % (ref 34.0–46.6)
Hemoglobin: 14.5 g/dL (ref 11.1–15.9)
MCH: 31.2 pg (ref 26.6–33.0)
MCHC: 33.4 g/dL (ref 31.5–35.7)
MCV: 93 fL (ref 79–97)
Platelets: 194 10*3/uL (ref 150–450)
RBC: 4.65 x10E6/uL (ref 3.77–5.28)
RDW: 13.9 % (ref 11.7–15.4)
WBC: 17.9 10*3/uL — ABNORMAL HIGH (ref 3.4–10.8)

## 2022-02-02 LAB — POCT INR: INR: 3.3 — AB (ref 2.0–3.0)

## 2022-02-02 NOTE — Telephone Encounter (Signed)
Reaching out to patient to offer assistance regarding upcoming cardiac imaging study; pt verbalizes understanding of appt date/time, parking situation and where to check in, pre-test NPO status, and verified current allergies; name and call back number provided for further questions should they arise ? ?Gordy Clement RN Navigator Cardiac Imaging ?[facility] ?[phone] office ?[phone] cell ? ?Patient to arrive at 1pm for her 1:30pm scan. ?
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
## 2022-02-02 NOTE — Patient Instructions (Signed)
Medication Instructions:  ?Your physician recommends that you continue on your current medications as directed. Please refer to the Current Medication list given to you today. ? ?*If you need a refill on your cardiac medications before your next appointment, please call your pharmacy* ? ? ?Lab Work: ?CBC and BMET today ? ?If you have labs (blood work) drawn today and your tests are completely normal, you will receive your results only by: ?MyChart Message (if you have MyChart) OR ?A paper copy in the mail ?If you have any lab test that is abnormal or we need to change your treatment, we will call you to review the results. ? ? ?Testing/Procedures: ?None ordered. ? ? ? ?Follow-Up: ?At [facility], you and your health needs are our priority.  As part of our continuing mission to provide you with exceptional heart care, we have created designated Provider Care Teams.  These Care Teams include your primary Cardiologist (physician) and Advanced Practice Providers (APPs -  Physician Assistants and Nurse Practitioners) who all work together to provide you with the care you need, when you need it. ? ?We recommend signing up for the patient portal called "MyChart".  Sign up information is provided on this After Visit Summary.  MyChart is used to connect with patients for Virtual Visits (Telemedicine).  Patients are able to view lab/test results, encounter notes, upcoming appointments, etc.  Non-urgent messages can be sent to your provider as well.   ?To learn more about what you can do with MyChart, go to [email or web address].   ? ?Your next appointment:   ?As scheduled ?

## 2022-02-02 NOTE — Patient Instructions (Signed)
TAKE 0.5 TABLET TONIGHT ONLY and then Continue warfarin 1 tablet daily except for a 1/2 on Tuesday and Saturday. Recheck INR in 3 weeks. [facility] [phone];  Ablation 3/29.  Eat greens tonight. ?
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
## 2022-02-03 ENCOUNTER — Other Ambulatory Visit: Payer: Self-pay | Admitting: Cardiovascular Disease

## 2022-02-06 ENCOUNTER — Encounter: Payer: Self-pay | Admitting: Cardiology

## 2022-02-06 ENCOUNTER — Ambulatory Visit (HOSPITAL_COMMUNITY)
Admission: RE | Admit: 2022-02-06 | Discharge: 2022-02-06 | Disposition: A | Discharge status: Home / Self Care | Payer: Medicare Other | Source: Ambulatory Visit | Attending: Cardiology | Admitting: Cardiology

## 2022-02-06 ENCOUNTER — Other Ambulatory Visit: Payer: Self-pay

## 2022-02-06 ENCOUNTER — Telehealth: Payer: Self-pay

## 2022-02-06 DIAGNOSIS — I480 Paroxysmal atrial fibrillation: Secondary | ICD-10-CM | POA: Insufficient documentation

## 2022-02-06 MED ORDER — IOHEXOL 350 MG/ML SOLN
80.0000 mL | Freq: Once | INTRAVENOUS | Status: AC | PRN
  Administered 2022-02-06: 80 mL via INTRAVENOUS

## 2022-02-06 NOTE — Telephone Encounter (Signed)
Spoke with patient and informed of provider's response. Patient verbalized understanding and will follow up with cardiology as planned.  ?

## 2022-02-06 NOTE — Telephone Encounter (Signed)
-----   Message from Broadus John, MD sent at 02/03/2022  6:47 PM EDT ----- ?Regarding: RE: Question about the need for surgery ?Contact: [phone] ?Nope. I spoke to Dr. Gwenlyn Found - No need for surgery. Continue follow up scheduled. ? ?Thank you ? ?----- Message ----- ?From: Nicholas Lose, RN ?Sent: 02/03/2022  11:08 AM EDT ?To: Broadus John, MD ?Subject: FW: Question about the need for surgery       ? ?Please see below message and advise. Thanks ? ?Ke[place] ?----- Message ----- ?From: Leilani Merl ?Sent: 02/03/2022   9:56 AM EDT ?To: [facility] Clinical [facility] ?Subject: Question about the need for surgery           ? ?Mrs. Stiff saw Dr. Virl Cagey on 3/17 ? ?She understands that she does not need urgent surgery.  She wonders if she needs to consider non-urgent surgery as things stand now (if nothing changes). ? ? ? ?

## 2022-02-06 NOTE — Telephone Encounter (Signed)
Appointment made for 5/16 with Dr. Fletcher Anon ?

## 2022-02-07 ENCOUNTER — Telehealth: Payer: Self-pay | Admitting: Cardiology

## 2022-02-07 NOTE — Telephone Encounter (Signed)
Spoke with pt and discussed her questions.  Explained why she shouldn't drink or take her medications. Pt verbalized understanding and was appreciative for explanation.   ?

## 2022-02-07 NOTE — Pre-Procedure Instructions (Signed)
Instructed patient on the following items: ?Arrival time 0930 ?Nothing to eat or drink after midnight ?No meds AM of procedure ?Responsible person to drive you home and stay with you for 24 hrs ? ?Have you missed any doses of anti-coagulant Coumadin- hasn't missed any doses ? ?   ?

## 2022-02-07 NOTE — Telephone Encounter (Signed)
Patient had questions regarding her upcoming Ablation tomorrow (02/08/22). ? ?Patient wants to know if she can drink water at all after midnight. Her appointment is not scheduled until 9:30 am ?Patient is concerned about holding metoprolol and lisinopril and that it could raise her BP and HR prior to the procedure.  ?

## 2022-02-08 ENCOUNTER — Other Ambulatory Visit: Payer: Self-pay

## 2022-02-08 ENCOUNTER — Ambulatory Visit (HOSPITAL_BASED_OUTPATIENT_CLINIC_OR_DEPARTMENT_OTHER): Payer: Medicare Other | Admitting: Certified Registered Nurse Anesthetist

## 2022-02-08 ENCOUNTER — Ambulatory Visit (HOSPITAL_COMMUNITY): Payer: Medicare Other | Admitting: Certified Registered Nurse Anesthetist

## 2022-02-08 ENCOUNTER — Encounter (HOSPITAL_COMMUNITY): Payer: Self-pay | Admitting: Cardiology

## 2022-02-08 ENCOUNTER — Ambulatory Visit (HOSPITAL_COMMUNITY)
Admission: RE | Admit: 2022-02-08 | Discharge: 2022-02-09 | Disposition: A | Discharge status: Home / Self Care | Payer: Medicare Other | Attending: Cardiology | Admitting: Cardiology

## 2022-02-08 ENCOUNTER — Encounter (HOSPITAL_COMMUNITY)
Admission: RE | Disposition: A | Discharge status: Home / Self Care | Payer: Self-pay | Source: Home / Self Care | Attending: Cardiology

## 2022-02-08 DIAGNOSIS — C9110 Chronic lymphocytic leukemia of B-cell type not having achieved remission: Secondary | ICD-10-CM | POA: Insufficient documentation

## 2022-02-08 DIAGNOSIS — I480 Paroxysmal atrial fibrillation: Secondary | ICD-10-CM | POA: Diagnosis not present

## 2022-02-08 DIAGNOSIS — J449 Chronic obstructive pulmonary disease, unspecified: Secondary | ICD-10-CM | POA: Insufficient documentation

## 2022-02-08 DIAGNOSIS — I7300 Raynaud's syndrome without gangrene: Secondary | ICD-10-CM | POA: Diagnosis not present

## 2022-02-08 DIAGNOSIS — Z8669 Personal history of other diseases of the nervous system and sense organs: Secondary | ICD-10-CM | POA: Insufficient documentation

## 2022-02-08 DIAGNOSIS — F419 Anxiety disorder, unspecified: Secondary | ICD-10-CM | POA: Insufficient documentation

## 2022-02-08 DIAGNOSIS — I2510 Atherosclerotic heart disease of native coronary artery without angina pectoris: Secondary | ICD-10-CM | POA: Insufficient documentation

## 2022-02-08 DIAGNOSIS — Z87891 Personal history of nicotine dependence: Secondary | ICD-10-CM | POA: Insufficient documentation

## 2022-02-08 DIAGNOSIS — I484 Atypical atrial flutter: Secondary | ICD-10-CM | POA: Diagnosis not present

## 2022-02-08 DIAGNOSIS — I483 Typical atrial flutter: Secondary | ICD-10-CM | POA: Insufficient documentation

## 2022-02-08 DIAGNOSIS — Z951 Presence of aortocoronary bypass graft: Secondary | ICD-10-CM | POA: Diagnosis not present

## 2022-02-08 DIAGNOSIS — I4819 Other persistent atrial fibrillation: Secondary | ICD-10-CM | POA: Diagnosis present

## 2022-02-08 DIAGNOSIS — Z7901 Long term (current) use of anticoagulants: Secondary | ICD-10-CM | POA: Insufficient documentation

## 2022-02-08 DIAGNOSIS — I4891 Unspecified atrial fibrillation: Secondary | ICD-10-CM

## 2022-02-08 DIAGNOSIS — I10 Essential (primary) hypertension: Secondary | ICD-10-CM | POA: Insufficient documentation

## 2022-02-08 DIAGNOSIS — G4730 Sleep apnea, unspecified: Secondary | ICD-10-CM | POA: Diagnosis not present

## 2022-02-08 DIAGNOSIS — Z79899 Other long term (current) drug therapy: Secondary | ICD-10-CM | POA: Insufficient documentation

## 2022-02-08 DIAGNOSIS — E7800 Pure hypercholesterolemia, unspecified: Secondary | ICD-10-CM | POA: Diagnosis not present

## 2022-02-08 HISTORY — PX: ATRIAL FIBRILLATION ABLATION: EP1191

## 2022-02-08 LAB — POCT ACTIVATED CLOTTING TIME
Activated Clotting Time: 0 seconds
Activated Clotting Time: >1000 seconds
Activated Clotting Time: >1000 seconds
Activated Clotting Time: >1000 seconds
Activated Clotting Time: >1000 seconds
Activated Clotting Time: 167 seconds
Activated Clotting Time: 672 seconds

## 2022-02-08 LAB — APTT: aPTT: 39 seconds — ABNORMAL HIGH (ref 24–36)

## 2022-02-08 LAB — HEPATIC FUNCTION PANEL
ALT: 19 U/L (ref 0–44)
AST: 24 U/L (ref 15–41)
Albumin: 3.2 g/dL — ABNORMAL LOW (ref 3.5–5.0)
Alkaline Phosphatase: 38 U/L (ref 38–126)
Bilirubin, Direct: 0.1 mg/dL (ref 0.0–0.2)
Indirect Bilirubin: 0.5 mg/dL (ref 0.3–0.9)
Total Bilirubin: 0.6 mg/dL (ref 0.3–1.2)
Total Protein: 5.1 g/dL — ABNORMAL LOW (ref 6.5–8.1)

## 2022-02-08 LAB — PROTIME-INR
INR: 2.6 — ABNORMAL HIGH (ref 0.8–1.2)
Prothrombin Time: 28.1 seconds — ABNORMAL HIGH (ref 11.4–15.2)

## 2022-02-08 LAB — CBC
HCT: 37.1 % (ref 36.0–46.0)
Hemoglobin: 12.5 g/dL (ref 12.0–15.0)
MCH: 31.5 pg (ref 26.0–34.0)
MCHC: 33.7 g/dL (ref 30.0–36.0)
MCV: 93.5 fL (ref 80.0–100.0)
Platelets: 150 10*3/uL (ref 150–400)
RBC: 3.97 MIL/uL (ref 3.87–5.11)
RDW: 12.8 % (ref 11.5–15.5)
WBC: 16.3 10*3/uL — ABNORMAL HIGH (ref 4.0–10.5)
nRBC: 0 % (ref 0.0–0.2)

## 2022-02-08 LAB — DIC (DISSEMINATED INTRAVASCULAR COAGULATION)PANEL
D-Dimer, Quant: 0.3 ug/mL-FEU (ref 0.00–0.50)
Fibrinogen: 317 mg/dL (ref 210–475)
INR: 3 — ABNORMAL HIGH (ref 0.8–1.2)
Platelets: 137 10*3/uL — ABNORMAL LOW (ref 150–400)
Prothrombin Time: 31 seconds — ABNORMAL HIGH (ref 11.4–15.2)
Smear Review: NONE SEEN
aPTT: 47 seconds — ABNORMAL HIGH (ref 24–36)

## 2022-02-08 LAB — ANTITHROMBIN III: AntiThromb III Func: 106 % (ref 75–120)

## 2022-02-08 SURGERY — ATRIAL FIBRILLATION ABLATION
Anesthesia: General

## 2022-02-08 MED ORDER — EPHEDRINE SULFATE-NACL 50-0.9 MG/10ML-% IV SOSY
PREFILLED_SYRINGE | INTRAVENOUS | Status: DC | PRN
  Administered 2022-02-08: 5 mg via INTRAVENOUS

## 2022-02-08 MED ORDER — DOCUSATE SODIUM 100 MG PO CAPS
200.0000 mg | ORAL_CAPSULE | Freq: Every day | ORAL | Status: DC
Start: 2022-02-08 — End: 2022-02-09
  Administered 2022-02-08 – 2022-02-09 (×2): 200 mg via ORAL
  Filled 2022-02-08 (×2): qty 2

## 2022-02-08 MED ORDER — ACETAMINOPHEN 325 MG PO TABS
650.0000 mg | ORAL_TABLET | ORAL | Status: DC | PRN
  Administered 2022-02-08 (×2): 650 mg via ORAL
  Filled 2022-02-08 (×2): qty 2

## 2022-02-08 MED ORDER — HEPARIN SODIUM (PORCINE) 1000 UNIT/ML IJ SOLN
INTRAMUSCULAR | Status: DC | PRN
  Administered 2022-02-08: 1000 [IU] via INTRAVENOUS

## 2022-02-08 MED ORDER — ONDANSETRON HCL 4 MG/2ML IJ SOLN
INTRAMUSCULAR | Status: DC | PRN
  Administered 2022-02-08: 4 mg via INTRAVENOUS

## 2022-02-08 MED ORDER — ROSUVASTATIN CALCIUM 20 MG PO TABS
20.0000 mg | ORAL_TABLET | Freq: Every day | ORAL | Status: DC
  Administered 2022-02-08 – 2022-02-09 (×2): 20 mg via ORAL
  Filled 2022-02-08 (×2): qty 1

## 2022-02-08 MED ORDER — VALACYCLOVIR HCL 500 MG PO TABS
500.0000 mg | ORAL_TABLET | Freq: Every day | ORAL | Status: DC
  Administered 2022-02-08 – 2022-02-09 (×2): 500 mg via ORAL
  Filled 2022-02-08 (×2): qty 1

## 2022-02-08 MED ORDER — SODIUM CHLORIDE 0.9 % IV SOLN: 250.0000 mL | INTRAVENOUS | Status: DC | PRN

## 2022-02-08 MED ORDER — ALBUTEROL SULFATE (2.5 MG/3ML) 0.083% IN NEBU: 3.0000 mL | INHALATION_SOLUTION | Freq: Four times a day (QID) | RESPIRATORY_TRACT | Status: DC | PRN

## 2022-02-08 MED ORDER — PROPOFOL 10 MG/ML IV BOLUS
INTRAVENOUS | Status: DC | PRN
  Administered 2022-02-08: 140 mg via INTRAVENOUS

## 2022-02-08 MED ORDER — OMEGA-3-ACID ETHYL ESTERS 1 G PO CAPS: 1.0000 g | ORAL_CAPSULE | Freq: Two times a day (BID) | ORAL | Status: DC

## 2022-02-08 MED ORDER — SODIUM CHLORIDE 0.9% FLUSH: 3.0000 mL | INTRAVENOUS | Status: DC | PRN

## 2022-02-08 MED ORDER — UMECLIDINIUM-VILANTEROL 62.5-25 MCG/ACT IN AEPB
1.0000 | INHALATION_SPRAY | Freq: Every day | RESPIRATORY_TRACT | Status: DC
  Administered 2022-02-08: 1 via RESPIRATORY_TRACT
  Filled 2022-02-08: qty 14

## 2022-02-08 MED ORDER — ROCURONIUM BROMIDE 10 MG/ML (PF) SYRINGE
PREFILLED_SYRINGE | INTRAVENOUS | Status: DC | PRN
  Administered 2022-02-08: 70 mg via INTRAVENOUS

## 2022-02-08 MED ORDER — SALINE SPRAY 0.65 % NA SOLN
1.0000 | NASAL | Status: DC | PRN
  Administered 2022-02-09: 1 via NASAL
  Filled 2022-02-08: qty 44

## 2022-02-08 MED ORDER — ONDANSETRON HCL 4 MG/2ML IJ SOLN: 4.0000 mg | Freq: Four times a day (QID) | INTRAMUSCULAR | Status: DC | PRN

## 2022-02-08 MED ORDER — SODIUM CHLORIDE 0.9% FLUSH: 3.0000 mL | Freq: Two times a day (BID) | INTRAVENOUS | Status: DC

## 2022-02-08 MED ORDER — LIDOCAINE 2% (20 MG/ML) 5 ML SYRINGE
INTRAMUSCULAR | Status: DC | PRN
Start: 2022-02-08 — End: 2022-02-08
  Administered 2022-02-08: 40 mg via INTRAVENOUS

## 2022-02-08 MED ORDER — METOPROLOL TARTRATE 12.5 MG HALF TABLET
12.5000 mg | ORAL_TABLET | Freq: Two times a day (BID) | ORAL | Status: DC
Start: 2022-02-08 — End: 2022-02-09
  Administered 2022-02-08 – 2022-02-09 (×2): 12.5 mg via ORAL
  Filled 2022-02-08 (×2): qty 1

## 2022-02-08 MED ORDER — OYSTER SHELL CALCIUM/D3 500-5 MG-MCG PO TABS
1.0000 | ORAL_TABLET | Freq: Every day | ORAL | Status: DC
  Filled 2022-02-08: qty 1

## 2022-02-08 MED ORDER — SODIUM CHLORIDE 0.9 % IV SOLN: INTRAVENOUS | Status: DC

## 2022-02-08 MED ORDER — UMECLIDINIUM-VILANTEROL 62.5-25 MCG/ACT IN AEPB
1.0000 | INHALATION_SPRAY | Freq: Every evening | RESPIRATORY_TRACT | Status: DC
  Filled 2022-02-08: qty 14

## 2022-02-08 MED ORDER — ACETAMINOPHEN 500 MG PO TABS: 1000.0000 mg | ORAL_TABLET | Freq: Three times a day (TID) | ORAL | Status: DC | PRN

## 2022-02-08 MED ORDER — PROTAMINE SULFATE 10 MG/ML IV SOLN
INTRAVENOUS | Status: DC | PRN
  Administered 2022-02-08 (×4): 20 mg via INTRAVENOUS

## 2022-02-08 MED ORDER — HEPARIN (PORCINE) IN NACL 1000-0.9 UT/500ML-% IV SOLN
INTRAVENOUS | Status: AC
  Filled 2022-02-08: qty 2500

## 2022-02-08 MED ORDER — NITROGLYCERIN 0.4 MG SL SUBL: 0.4000 mg | SUBLINGUAL_TABLET | SUBLINGUAL | Status: DC | PRN

## 2022-02-08 MED ORDER — ISOSORBIDE MONONITRATE ER 30 MG PO TB24
30.0000 mg | ORAL_TABLET | Freq: Every day | ORAL | Status: DC
  Administered 2022-02-08 – 2022-02-09 (×2): 30 mg via ORAL
  Filled 2022-02-08 (×2): qty 1

## 2022-02-08 MED ORDER — FENTANYL CITRATE (PF) 250 MCG/5ML IJ SOLN
INTRAMUSCULAR | Status: DC | PRN
  Administered 2022-02-08: 50 ug via INTRAVENOUS

## 2022-02-08 MED ORDER — HEPARIN SODIUM (PORCINE) 1000 UNIT/ML IJ SOLN
INTRAMUSCULAR | Status: DC | PRN
  Administered 2022-02-08: 14000 [IU] via INTRAVENOUS

## 2022-02-08 MED ORDER — DEXAMETHASONE SODIUM PHOSPHATE 10 MG/ML IJ SOLN
INTRAMUSCULAR | Status: DC | PRN
Start: 2022-02-08 — End: 2022-02-08
  Administered 2022-02-08: 10 mg via INTRAVENOUS

## 2022-02-08 MED ORDER — VITAMIN B-6 100 MG PO TABS
100.0000 mg | ORAL_TABLET | Freq: Every day | ORAL | Status: DC
  Filled 2022-02-08: qty 1

## 2022-02-08 MED ORDER — GUAIFENESIN-DM 100-10 MG/5ML PO SYRP
15.0000 mL | ORAL_SOLUTION | ORAL | Status: DC | PRN
  Administered 2022-02-08: 15 mL via ORAL
  Filled 2022-02-08: qty 15

## 2022-02-08 MED ORDER — EZETIMIBE 10 MG PO TABS
10.0000 mg | ORAL_TABLET | Freq: Every day | ORAL | Status: DC
  Administered 2022-02-08 – 2022-02-09 (×2): 10 mg via ORAL
  Filled 2022-02-08 (×2): qty 1

## 2022-02-08 MED ORDER — HEPARIN (PORCINE) IN NACL 1000-0.9 UT/500ML-% IV SOLN
INTRAVENOUS | Status: DC | PRN
  Administered 2022-02-08 (×4): 500 mL

## 2022-02-08 MED ORDER — VITAMIN B-12 1000 MCG PO TABS: 1000.0000 ug | ORAL_TABLET | Freq: Every day | ORAL | Status: DC

## 2022-02-08 MED ORDER — LISINOPRIL 20 MG PO TABS
20.0000 mg | ORAL_TABLET | Freq: Every day | ORAL | Status: DC
  Administered 2022-02-08 – 2022-02-09 (×2): 20 mg via ORAL
  Filled 2022-02-08 (×2): qty 1

## 2022-02-08 MED ORDER — PHENYLEPHRINE HCL-NACL 20-0.9 MG/250ML-% IV SOLN
INTRAVENOUS | Status: DC | PRN
  Administered 2022-02-08: 25 ug/min via INTRAVENOUS

## 2022-02-08 MED ORDER — VITAMIN D 25 MCG (1000 UNIT) PO TABS
1000.0000 [IU] | ORAL_TABLET | Freq: Every day | ORAL | Status: DC
  Administered 2022-02-08 – 2022-02-09 (×2): 1000 [IU] via ORAL
  Filled 2022-02-08 (×2): qty 1

## 2022-02-08 MED ORDER — SUGAMMADEX SODIUM 200 MG/2ML IV SOLN
INTRAVENOUS | Status: DC | PRN
Start: 2022-02-08 — End: 2022-02-08
  Administered 2022-02-08: 150 mg via INTRAVENOUS

## 2022-02-08 SURGICAL SUPPLY — 17 items
CATH 8FR REPROCESSED SOUNDSTAR (CATHETERS) ×2 IMPLANT
CATH 8FR SOUNDSTAR REPROCESSED (CATHETERS) IMPLANT
CATH OCTARAY 2.0 F 3-3-3-3-3 (CATHETERS) ×1 IMPLANT
CATH SMTCH THERMOCOOL SF DF (CATHETERS) ×1 IMPLANT
CATH WEB BI DIR CSDF CRV REPRO (CATHETERS) ×1 IMPLANT
CLOSURE PERCLOSE PROSTYLE (VASCULAR PRODUCTS) ×4 IMPLANT
COVER SWIFTLINK CONNECTOR (BAG) ×2 IMPLANT
KIT VERSACROSS STEERABLE D1 (CATHETERS) ×1 IMPLANT
PACK EP LATEX FREE (CUSTOM PROCEDURE TRAY) ×2
PACK EP LF (CUSTOM PROCEDURE TRAY) ×1 IMPLANT
PAD DEFIB RADIO PHYSIO CONN (PAD) ×2 IMPLANT
PATCH CARTO3 (PAD) ×1 IMPLANT
SHEATH CARTO VIZIGO SM CVD (SHEATH) ×1 IMPLANT
SHEATH PINNACLE 7F 10CM (SHEATH) ×1 IMPLANT
SHEATH PINNACLE 8F 10CM (SHEATH) ×2 IMPLANT
SHEATH PINNACLE 9F 10CM (SHEATH) ×1 IMPLANT
TUBING SMART ABLATE COOLFLOW (TUBING) ×1 IMPLANT

## 2022-02-08 NOTE — Interval H&P Note (Signed)
History and Physical Interval Note: ? ?02/08/2022 ?10:59 AM ? ?[name]  has presented today for surgery, with the diagnosis of afib.  The various methods of treatment have been discussed with the patient and family. After consideration of risks, benefits and other options for treatment, the patient has consented to  Procedure(s): ?[place] (N/A) as a surgical intervention.  The patient's history has been reviewed, patient examined, no change in status, stable for surgery.  I have reviewed the patient's chart and labs.  Questions were answered to the patient's satisfaction.   ? ? ?[name] [name] [name] ? ? ?

## 2022-02-08 NOTE — Anesthesia Preprocedure Evaluation (Addendum)
Anesthesia Evaluation  ?Patient identified by MRN, date of birth, ID band ?Patient awake ? ? ? ?Reviewed: ?Allergy & Precautions, NPO status , Patient's Chart, lab work & pertinent test results ? ?Airway ?Mallampati: II ? ?TM Distance: >3 FB ?Neck ROM: Full ? ? ? Dental ? ?(+) Teeth Intact, Dental Advisory Given ?  ?Pulmonary ?sleep apnea , COPD,  COPD inhaler, former smoker,  ?  ?breath sounds clear to auscultation ? ? ? ? ? ? Cardiovascular ?hypertension, Pt. on medications and Pt. on home beta blockers ?+ CAD, + CABG and + Peripheral Vascular Disease  ?+ dysrhythmias Atrial Fibrillation  ?Rhythm:Regular Rate:Normal ? ?Echo: ??1. Left ventricular ejection fraction, by estimation, is 65 to 70%. The  ?left ventricle has normal function. The left ventricle has no regional  ?wall motion abnormalities. Left ventricular diastolic parameters are  ?consistent with Grade II diastolic  ?dysfunction (pseudonormalization). Elevated left atrial pressure.  ??2. Right ventricular systolic function is normal. The right ventricular  ?size is normal. There is moderately elevated pulmonary artery systolic  ?pressure.  ??3. Left atrial size was severely dilated.  ??4. Aneurysmal dilitation of intratrial septum with septum bulgiing toward  ?right atrium consistent with increased L atrial pressures.  ??5. Mild mitral valve regurgitation.  ??6. Tricuspid valve regurgitation is mild to moderate.  ??7. The aortic valve is tricuspid. Aortic valve regurgitation is not  ?visualized. Aortic valve sclerosis is present, with no evidence of aortic  ?valve stenosis.  ?  ?Neuro/Psych ? Headaches, Seizures -,  Anxiety   ? GI/Hepatic ?negative GI ROS, Neg liver ROS,   ?Endo/Other  ?negative endocrine ROS ? Renal/GU ?negative Renal ROS  ? ?  ?Musculoskeletal ? ?(+) Arthritis ,  ? Abdominal ?Normal abdominal exam  (+)   ?Peds ? Hematology ?negative hematology ROS ?(+)   ?Anesthesia Other Findings ? ?  Reproductive/Obstetrics ? ?  ? ? ? ? ? ? ? ? ? ? ? ? ? ?  ?  ? ? ? ? ? ? ? ?Anesthesia Physical ?Anesthesia Plan ? ?ASA: 3 ? ?Anesthesia Plan: General  ? ?Post-op Pain Management:   ? ?Induction: Intravenous ? ?PONV Risk Score and Plan: Ondansetron and Treatment may vary due to age or medical condition ? ?Airway Management Planned: Oral ETT ? ?Additional Equipment: None ? ?Intra-op Plan:  ? ?Post-operative Plan: Extubation in OR ? ?Informed Consent: I have reviewed the patients History and Physical, chart, labs and discussed the procedure including the risks, benefits and alternatives for the proposed anesthesia with the patient or authorized representative who has indicated his/her understanding and acceptance.  ? ? ? ?Dental advisory given ? ?Plan Discussed with: CRNA ? ?Anesthesia Plan Comments:   ? ? ? ? ? ? ?Anesthesia Quick Evaluation ? ?

## 2022-02-08 NOTE — Transfer of Care (Signed)
Immediate Anesthesia Transfer of Care Note ? ?Patient: [name] ? ?Procedure(s) Performed: ATRIAL FIBRILLATION ABLATION ? ?Patient Location: Cath Lab ? ?Anesthesia Type:General ? ?Level of Consciousness: drowsy ? ?Airway & Oxygen Therapy: Patient Spontanous Breathing ? ?Post-op Assessment: Report given to RN and Post -op Vital signs reviewed and stable ? ?Post vital signs: Reviewed and stable ? ?Last Vitals:  ?Vitals Value Taken Time  ?BP 114/32 02/08/22 1441  ?Temp 36.1 ?C 02/08/22 1445  ?Pulse 67 02/08/22 1445  ?Resp 24 02/08/22 1445  ?SpO2 94 % 02/08/22 1445  ?Vitals shown include unvalidated device data. ? ?Last Pain:  ?Vitals:  ? 02/08/22 1425  ?TempSrc:   ?PainSc: 2   ?   ? ?  ? ?Complications: There were no known notable events for this encounter. ?

## 2022-02-08 NOTE — Anesthesia Procedure Notes (Signed)
Procedure Name: Intubation ?Date/Time: 02/08/2022 11:51 AM ?Performed by: Carolan Clines, CRNA ?Pre-anesthesia Checklist: Patient identified, Emergency Drugs available, Suction available and Patient being monitored ?Patient Re-evaluated:Patient Re-evaluated prior to induction ?Oxygen Delivery Method: Circle System Utilized ?Preoxygenation: Pre-oxygenation with 100% oxygen ?Induction Type: IV induction ?Ventilation: Mask ventilation without difficulty ?Laryngoscope Size: Glidescope and 3 ?Grade View: Grade I ?Tube type: Oral ?Tube size: 7.0 mm ?Number of attempts: 2 ?Airway Equipment and Method: Rigid stylet and Video-laryngoscopy ?Placement Confirmation: ETT inserted through vocal cords under direct vision, positive ETCO2 and breath sounds checked- equal and bilateral ?Secured at: 22 cm ?Tube secured with: Tape ?Dental Injury: Teeth and Oropharynx as per pre-operative assessment  ?Comments: DL x 1 with Mac 3, grade III view. Atraumatic oral intubation with GlideGo 3, grade I view. ? ? ? ? ?

## 2022-02-09 ENCOUNTER — Encounter (HOSPITAL_COMMUNITY): Payer: Self-pay | Admitting: Cardiology

## 2022-02-09 DIAGNOSIS — C9110 Chronic lymphocytic leukemia of B-cell type not having achieved remission: Secondary | ICD-10-CM | POA: Diagnosis not present

## 2022-02-09 DIAGNOSIS — G4730 Sleep apnea, unspecified: Secondary | ICD-10-CM | POA: Diagnosis not present

## 2022-02-09 DIAGNOSIS — I484 Atypical atrial flutter: Secondary | ICD-10-CM | POA: Diagnosis not present

## 2022-02-09 DIAGNOSIS — F419 Anxiety disorder, unspecified: Secondary | ICD-10-CM | POA: Diagnosis not present

## 2022-02-09 DIAGNOSIS — I2510 Atherosclerotic heart disease of native coronary artery without angina pectoris: Secondary | ICD-10-CM | POA: Diagnosis not present

## 2022-02-09 DIAGNOSIS — I483 Typical atrial flutter: Secondary | ICD-10-CM | POA: Diagnosis not present

## 2022-02-09 DIAGNOSIS — J449 Chronic obstructive pulmonary disease, unspecified: Secondary | ICD-10-CM | POA: Diagnosis not present

## 2022-02-09 DIAGNOSIS — I4819 Other persistent atrial fibrillation: Secondary | ICD-10-CM

## 2022-02-09 DIAGNOSIS — I480 Paroxysmal atrial fibrillation: Secondary | ICD-10-CM | POA: Diagnosis not present

## 2022-02-09 DIAGNOSIS — I7300 Raynaud's syndrome without gangrene: Secondary | ICD-10-CM | POA: Diagnosis not present

## 2022-02-09 DIAGNOSIS — I10 Essential (primary) hypertension: Secondary | ICD-10-CM | POA: Diagnosis not present

## 2022-02-09 DIAGNOSIS — E7800 Pure hypercholesterolemia, unspecified: Secondary | ICD-10-CM | POA: Diagnosis not present

## 2022-02-09 DIAGNOSIS — Z87891 Personal history of nicotine dependence: Secondary | ICD-10-CM | POA: Diagnosis not present

## 2022-02-09 NOTE — Anesthesia Postprocedure Evaluation (Signed)
Anesthesia Post Note ? ?Patient: [name] ? ?Procedure(s) Performed: ATRIAL FIBRILLATION ABLATION ? ?  ? ?Patient location during evaluation: PACU ?Anesthesia Type: General ?Level of consciousness: sedated and patient cooperative ?Pain management: pain level controlled ?Vital Signs Assessment: post-procedure vital signs reviewed and stable ?Respiratory status: spontaneous breathing ?Cardiovascular status: stable ?Anesthetic complications: no ? ? ?There were no known notable events for this encounter. ? ?Last Vitals:  ?Vitals:  ? 02/09/22 0804 02/09/22 0848  ?BP: (!) 116/55 (!) 116/55  ?Pulse: 71 70  ?Resp: 18   ?Temp: 36.7 ?C   ?SpO2: 98%   ?  ?Last Pain:  ?Vitals:  ? 02/09/22 0804  ?TempSrc: Oral  ?PainSc:   ? ? ?  ?  ?  ?  ?  ?  ? ?Nolon Nations ? ? ? ? ?

## 2022-02-09 NOTE — Discharge Summary (Addendum)
? ? ?ELECTROPHYSIOLOGY PROCEDURE DISCHARGE SUMMARY  ? ? ?Patient ID: [name],  ?MRN: [id number], DOB/AGE: 03/11/1946 76 y.o. ? ?Admit date: 02/08/2022 ?Discharge date: 02/09/2022 ? ?Primary Care Physician: [name], MD  ?Primary Cardiologist: Janina Mayo, MD  ?Electrophysiologist: Dr. Curt Bears ? ?Primary Discharge Diagnosis:  ?Paroxysmal Atrial Fibrillation ? ?Secondary Discharge Diagnosis:  ?CAD ?HTN ?HLD ? ?Procedures This Admission:  ?1.  Electrophysiology study and radiofrequency catheter ablation of Atrial Fibrillation on 02/08/2022 by Dr. Curt Bears.   ?This study demonstrated:   ?i. Atrial fibrillation upon presentation.   ?ii. Successful electrical isolation and anatomical encircling of all four pulmonary veins with radiofrequency current.  A WACA approach was used ?iii. Additional left atrial ablation was performed with a standard box lesion created along the posterior wall of the left atrium ?iv. Atrial fibrillation successfully cardioverted to sinus rhythm. ?v. No early apparent complications. ? ?Brief HPI: ?[name] is a 76 y.o. female with a history of paroxysmal Atrial Fibrillation.  They have failed medical therapy with tikosyn. Risks, benefits, and alternatives to catheter ablation of Atrial Fibrillation were reviewed with the patient who wished to proceed.  The patient had been on uninterrupted [place] with stable INR and did not require TEE.  ? ?Hospital Course:  ?The patient was admitted and underwent EPS/RFCA of Atrial Fibrillation with details as outlined above. She was monitored overnight given suprisingly elevated ACTs during procedure.  They were monitored on telemetry overnight which demonstrated NSR.  Groin was without complication on the day of discharge.  The patient was examined and considered to be stable for discharge.  Wound care and restrictions were reviewed with the patient.  The patient Dotsie Gillette be seen back by Adline Peals, PA in 4 weeks and Dr. Curt Bears in 12 weeks for  post ablation follow up.  ? ?This patients CHA2DS2-VASc Score and unadjusted Ischemic Stroke Rate (% per year) is equal to 9.7 % stroke rate/year from a score of 6 ?Above score calculated as 1 point each if present [CHF, HTN, DM, Vascular=MI/PAD/Aortic Plaque, Age if 76-74, or Female] ?Above score calculated as 2 points each if present [Age > 75, or Stroke/TIA/TE]  ?  ?   ? ?Physical Exam: ?Vitals:  ? 02/08/22 2057 02/09/22 2585 02/09/22 0443 02/09/22 0804  ?BP:  (!) 112/46 (!) 94/45 (!) 116/55  ?Pulse:  (!) 59 63 71  ?Resp:  18 18 18   ?Temp:  97.7 ?F (36.5 ?C) 97.8 ?F (36.6 ?C) 98.1 ?F (36.7 ?C)  ?TempSrc:  Oral Oral Oral  ?SpO2: 99% 97% 96% 98%  ?Weight:      ?Height:      ? ? ?GEN- The patient is well appearing, alert and oriented x 3 today.   ?HEENT: normocephalic, atraumatic; sclera clear, conjunctiva pink; hearing intact; oropharynx clear; neck supple  ?Lungs- Clear to ausculation bilaterally, normal work of breathing.  No wheezes, rales, rhonchi ?Heart- Regular rate and rhythm, no murmurs, rubs or gallops  ?GI- soft, non-tender, non-distended, bowel sounds present  ?Extremities- no clubbing, cyanosis, or edema; DP/PT/radial pulses 2+ bilaterally, groin without hematoma/bruit ?MS- no significant deformity or atrophy ?Skin- warm and dry, no rash or lesion ?Psych- euthymic mood, full affect ?Neuro- strength and sensation are intact ? ? ?Labs: ?  ?Lab Results  ?Component Value Date  ? WBC 16.3 (H) 02/08/2022  ? HGB 12.5 02/08/2022  ? HCT 37.1 02/08/2022  ? MCV 93.5 02/08/2022  ? PLT 150 02/08/2022  ? PLT 137 (L) 02/08/2022  ?  ?Recent  Labs  ?Lab 02/02/22 ?1311 02/08/22 ?1359  ?NA 139  --   ?K 4.4  --   ?CL 101  --   ?CO2 26  --   ?BUN 17  --   ?CREATININE 0.67  --   ?CALCIUM 9.5  --   ?PROT  --  5.1*  ?BILITOT  --  0.6  ?ALKPHOS  --  38  ?ALT  --  19  ?AST  --  24  ?GLUCOSE 97  --   ? ? ? ?Discharge Medications:  ?Allergies as of 02/09/2022   ? ?   Reactions  ? Amiodarone Anaphylaxis, Other (See Comments)  ?  Angioedema, swelling of throat and tongue  ? Amlodipine Other (See Comments)  ? Pt states that she just can not tolerate, states blood pressure goes up and down and experiences leg heaviness.  ? Clindamycin/lincomycin Swelling  ? TROUBLE SWALLOWING......SEVERE CHEST PAIN  ? Doxycycline Other (See Comments)  ? blistering  ? Sulfa Antibiotics Rash, Other (See Comments)  ? Red, burning rash & paralysis ?Burning Rash  ? Lipitor [atorvastatin] Other (See Comments)  ? MYALGIAS ?LEG PAIN  ? Phenergan [promethazine Hcl] Other (See Comments)  ? Nervous Leg / Restless Leg Syndrome  ? Reclast [zoledronic Acid] Other (See Comments)  ? Flu symptoms - made pt very sick, and had inflammation in her eye  ? Carvedilol Other (See Comments)  ? Per patient was "wiped out"  for 2 days after taking   ? Cephalexin   ? Unknown reaction   ? Dofetilide   ? Muscle Pain ?Lumbar pain and effects walking  ? Ketorolac Nausea Only  ? headache  ? Diltiazem Other (See Comments)  ? Weakness on oral Dilt, tolerating 120 mg currently  ? Latex Rash  ? ?  ? ?  ?Medication List  ?  ? ?TAKE these medications   ? ?acetaminophen 500 MG tablet ?Commonly known as: TYLENOL ?Take 1,000 mg by mouth every 8 (eight) hours as needed (pain.). ?  ?albuterol 108 (90 Base) MCG/ACT inhaler ?Commonly known as: VENTOLIN HFA ?Inhale 2 puffs into the lungs every 6 (six) hours as needed for wheezing or shortness of breath. ?What changed: how much to take ?  ?Anoro Ellipta 62.5-25 MCG/ACT Aepb ?Generic drug: umeclidinium-vilanterol ?Inhale 1 puff into the lungs daily. ?What changed: when to take this ?  ?CALTRATE 600+D3 PO ?Take 1 tablet by mouth daily at 12 noon. ?  ?cholecalciferol 25 MCG (1000 UNIT) tablet ?Commonly known as: VITAMIN D3 ?Take 1,000 Units by mouth daily. ?  ?diltiazem 120 MG 24 hr capsule ?Commonly known as: CARDIZEM CD ?Take 1 capsule (120 mg total) by mouth daily. ?What changed: when to take this ?  ?docusate sodium 100 MG capsule ?Commonly known as:  COLACE ?Take 200 mg by mouth daily. ?  ?ezetimibe 10 MG tablet ?Commonly known as: ZETIA ?Take 1 tablet (10 mg total) by mouth daily. ?  ?hydrocortisone valerate cream 0.2 % ?Commonly known as: WESTCORT ?Apply 1 application topically as needed (for eczema). ?  ?isosorbide mononitrate 30 MG 24 hr tablet ?Commonly known as: IMDUR ?Take 1 tablet by mouth once daily ?  ?lisinopril 20 MG tablet ?Commonly known as: ZESTRIL ?Take 20 mg by mouth in the morning and at bedtime. ?  ?metoprolol tartrate 25 MG tablet ?Commonly known as: LOPRESSOR ?Take 12.5 mg by mouth 2 (two) times daily. ?  ?nitroGLYCERIN 0.4 MG SL tablet ?Commonly known as: NITROSTAT ?Place 1 tablet (0.4 mg total) under the tongue  every 5 (five) minutes x 3 doses as needed for chest pain. ?  ?omega-3 acid ethyl esters 1 g capsule ?Commonly known as: LOVAZA ?Take 1 capsule (1 g total) by mouth 2 (two) times daily. ?  ?pyridOXINE 100 MG tablet ?Commonly known as: VITAMIN B-6 ?Take 100 mg by mouth daily at 12 noon. ?  ?rosuvastatin 20 MG tablet ?Commonly known as: CRESTOR ?Take 1 tablet (20 mg total) by mouth daily. ?  ?THERATEARS PF OP ?Place 1 drop into both eyes 3 (three) times daily. ?  ?valACYclovir 500 MG tablet ?Commonly known as: VALTREX ?Take 500 mg by mouth daily. ?  ?vitamin B-12 1000 MCG tablet ?Commonly known as: CYANOCOBALAMIN ?Take 1,000 mcg by mouth daily at 12 noon. ?  ?VITAMIN C PO ?Take 1 tablet by mouth daily as needed (immune support). ?  ?warfarin 2.5 MG tablet ?Commonly known as: COUMADIN ?Take as directed. If you are unsure how to take this medication, talk to your nurse or doctor. ?Original instructions: TAKE 1-2 TABLETS DAILY or as PRESCRIBED by CLINIC ?What changed: additional instructions ?  ? ?  ? ? ?Disposition:  ? ? Follow-up Information   ? ? [facility].   ?Specialty: Cardiology ?Why: on 4/26 at 1000 am for post ablation follow up ?Contact information: ?[address] ?[id number] mc ?[place] ?[id number] ? ?  ?  ? ?  ?  ? ?  ? ? ?Duration of Discharge Encounter: Greater than 30 minutes including physician time. ? ?Signed, ?Shirley Friar, PA-C  ?02/09/2022 ?8:23 A

## 2022-02-09 NOTE — Discharge Instructions (Signed)
Cardiac Ablation, Care After  This sheet gives you information about how to care for yourself after your procedure. Your health care provider may also give you more specific instructions. If you have problems or questions, contact your health care provider. What can I expect after the procedure? After the procedure, it is common to have:  Bruising around your puncture site.  Tenderness around your puncture site.  Skipped heartbeats.  Tiredness (fatigue).  Follow these instructions at home: Puncture site care   Follow instructions from your health care provider about how to take care of your puncture site. Make sure you: ? If present, leave stitches (sutures), skin glue, or adhesive strips in place. These skin closures may need to stay in place for up to 2 weeks. If adhesive strip edges start to loosen and curl up, you may trim the loose edges. Do not remove adhesive strips completely unless your health care provider tells you to do that. ? If a large square bandage is present, this may be removed 24 hours after surgery.   Check your puncture site every day for signs of infection. Check for: ? Redness, swelling, or pain. ? Fluid or blood. If your puncture site starts to bleed, lie down on your back, apply firm pressure to the area, and contact your health care provider. ? Warmth. ? Pus or a bad smell. Driving  Do not drive for at least 4 days after your procedure or however long your health care provider recommends. (Do not resume driving if you have previously been instructed not to drive for other health reasons.)  Do not drive or use heavy machinery while taking prescription pain medicine. Activity  Avoid activities that take a lot of effort for at least 7 days after your procedure.  Do not lift anything that is heavier than 5 lb (4.5 kg) for one week.   No sexual activity for 1 week.   Return to your normal activities as told by your health care provider. Ask your health  care provider what activities are safe for you. General instructions  Take over-the-counter and prescription medicines only as told by your health care provider.  Do not use any products that contain nicotine or tobacco, such as cigarettes and e-cigarettes. If you need help quitting, ask your health care provider.  You may shower after 24 hours, but Do not take baths, swim, or use a hot tub for 1 week.   Do not drink alcohol for 24 hours after your procedure.  Keep all follow-up visits as told by your health care provider. This is important. Contact a health care provider if:  You have redness, mild swelling, or pain around your puncture site.  You have fluid or blood coming from your puncture site that stops after applying firm pressure to the area.  Your puncture site feels warm to the touch.  You have pus or a bad smell coming from your puncture site.  You have a fever.  You have chest pain or discomfort that spreads to your neck, jaw, or arm.  You are sweating a lot.  You feel nauseous.  You have a fast or irregular heartbeat.  You have shortness of breath.  You are dizzy or light-headed and feel the need to lie down.  You have pain or numbness in the arm or leg closest to your puncture site. Get help right away if:  Your puncture site suddenly swells.  Your puncture site is bleeding and the bleeding does not stop after   applying firm pressure to the area. These symptoms may represent a serious problem that is an emergency. Do not wait to see if the symptoms will go away. Get medical help right away. Call your local emergency services (911 in the [place] Do not drive yourself to the hospital. Summary  After the procedure, it is normal to have bruising and tenderness at the puncture site in your groin, neck, or forearm.  Check your puncture site every day for signs of infection.  Get help right away if your puncture site is bleeding and the bleeding does not stop  after applying firm pressure to the area. This is a medical emergency. This information is not intended to replace advice given to you by your health care provider. Make sure you discuss any questions you have with your health care provider.   

## 2022-02-10 ENCOUNTER — Ambulatory Visit (INDEPENDENT_AMBULATORY_CARE_PROVIDER_SITE_OTHER): Payer: Medicare Other

## 2022-02-10 DIAGNOSIS — Z5181 Encounter for therapeutic drug level monitoring: Secondary | ICD-10-CM | POA: Diagnosis not present

## 2022-02-10 DIAGNOSIS — I480 Paroxysmal atrial fibrillation: Secondary | ICD-10-CM | POA: Diagnosis not present

## 2022-02-10 LAB — POCT INR: INR: 3.2 — AB (ref 2.0–3.0)

## 2022-02-10 NOTE — Patient Instructions (Signed)
HOLD TONIGHT ONLY and then Continue warfarin 1 tablet daily except for a 1/2 on Tuesday and Saturday. Recheck INR in 3 weeks. [facility] [phone];   ?

## 2022-02-13 ENCOUNTER — Telehealth: Payer: Self-pay

## 2022-02-13 NOTE — Telephone Encounter (Signed)
Patient called and would like to discuss, with a pharmacist a study she read read regarding prunes and improving osteoporosis.  Please call to further discuss.  Thank you ?

## 2022-02-13 NOTE — Telephone Encounter (Signed)
Returned a call to pt to schedule leqvio injections who then proceeded to say that she would like to wait 2-3 months before scheduling and she will call us back to get scheduled ?

## 2022-02-13 NOTE — Telephone Encounter (Signed)
Patient would like to schedule Inclisiran injections ?

## 2022-02-21 ENCOUNTER — Telehealth: Payer: Self-pay | Admitting: Cardiology

## 2022-02-21 NOTE — Telephone Encounter (Signed)
Discussed with Adline Peals PA will stop Diltiazem and see if swelling improves. If no improvement she will let us know.  ?

## 2022-02-21 NOTE — Telephone Encounter (Signed)
Pt c/o swelling: STAT is pt has developed SOB within 24 hours ? ?If swelling, where is the swelling located? Swelling in ankles , and her feet and legs feel heavy  ? ?How much weight have you gained and in what time span? Na  ? ?Have you gained 3 pounds in a day or 5 pounds in a week? Na  ? ?Do you have a log of your daily weights (if so, list)? No  ? ?Are you currently taking a fluid pill? No She can  not take them.   ? ?Are you currently SOB? Yes just more short of breath then normal when she goes out and walks for cardio . She was put on diltiazem (CARDIZEM CD) 120 MG 24 hr capsule [[id number]] .   ? ?Have you traveled recently? No  ? ?She is wondering if the the change is the new meds ?She stated she is has been having a little fluttering at night since the ablation  ? ?Best number [phone]  ?

## 2022-02-22 ENCOUNTER — Telehealth: Payer: Self-pay | Admitting: Pulmonary Disease

## 2022-02-22 MED ORDER — ANORO ELLIPTA 62.5-25 MCG/ACT IN AEPB: 1.0000 | INHALATION_SPRAY | Freq: Every day | RESPIRATORY_TRACT | 5 refills | Status: DC

## 2022-02-22 NOTE — Telephone Encounter (Signed)
Rx for Anoro has been sent to the pharmacy for pt. Called and spoke with pt letting her know this had been done and she verbalized understanding. Nothing further needed. ?

## 2022-02-27 DIAGNOSIS — Z20822 Contact with and (suspected) exposure to covid-19: Secondary | ICD-10-CM | POA: Diagnosis not present

## 2022-03-01 DIAGNOSIS — I10 Essential (primary) hypertension: Secondary | ICD-10-CM | POA: Diagnosis not present

## 2022-03-01 DIAGNOSIS — R600 Localized edema: Secondary | ICD-10-CM | POA: Diagnosis not present

## 2022-03-01 DIAGNOSIS — C9110 Chronic lymphocytic leukemia of B-cell type not having achieved remission: Secondary | ICD-10-CM | POA: Diagnosis not present

## 2022-03-01 DIAGNOSIS — I2510 Atherosclerotic heart disease of native coronary artery without angina pectoris: Secondary | ICD-10-CM | POA: Diagnosis not present

## 2022-03-01 DIAGNOSIS — D689 Coagulation defect, unspecified: Secondary | ICD-10-CM | POA: Diagnosis not present

## 2022-03-02 ENCOUNTER — Ambulatory Visit (INDEPENDENT_AMBULATORY_CARE_PROVIDER_SITE_OTHER): Payer: Medicare Other

## 2022-03-02 DIAGNOSIS — I480 Paroxysmal atrial fibrillation: Secondary | ICD-10-CM | POA: Diagnosis not present

## 2022-03-02 DIAGNOSIS — Z5181 Encounter for therapeutic drug level monitoring: Secondary | ICD-10-CM | POA: Diagnosis not present

## 2022-03-02 LAB — POCT INR: INR: 1.8 — AB (ref 2.0–3.0)

## 2022-03-02 NOTE — Patient Instructions (Signed)
TAKE 1.5 TABLETS TONIGHT ONLY and then Continue warfarin 1 tablet daily except for a 1/2 on Tuesday and Saturday. Recheck INR in 3 weeks. [facility] [phone];   ?

## 2022-03-07 ENCOUNTER — Other Ambulatory Visit: Payer: Self-pay | Admitting: *Deleted

## 2022-03-07 MED ORDER — ALBUTEROL SULFATE HFA 108 (90 BASE) MCG/ACT IN AERS: 2.0000 | INHALATION_SPRAY | Freq: Four times a day (QID) | RESPIRATORY_TRACT | 5 refills | Status: DC | PRN

## 2022-03-08 ENCOUNTER — Encounter (HOSPITAL_COMMUNITY): Payer: Self-pay | Admitting: Physician Assistant

## 2022-03-08 ENCOUNTER — Ambulatory Visit (HOSPITAL_COMMUNITY)
Admission: RE | Admit: 2022-03-08 | Discharge: 2022-03-08 | Disposition: A | Discharge status: Home / Self Care | Payer: Medicare Other | Source: Ambulatory Visit | Attending: Physician Assistant | Admitting: Physician Assistant

## 2022-03-08 VITALS — BP 136/78 | HR 60 | Ht 63.0 in | Wt 157.6 lb

## 2022-03-08 DIAGNOSIS — J449 Chronic obstructive pulmonary disease, unspecified: Secondary | ICD-10-CM | POA: Diagnosis not present

## 2022-03-08 DIAGNOSIS — F17200 Nicotine dependence, unspecified, uncomplicated: Secondary | ICD-10-CM | POA: Insufficient documentation

## 2022-03-08 DIAGNOSIS — M3500 Sicca syndrome, unspecified: Secondary | ICD-10-CM | POA: Insufficient documentation

## 2022-03-08 DIAGNOSIS — I480 Paroxysmal atrial fibrillation: Secondary | ICD-10-CM | POA: Diagnosis not present

## 2022-03-08 DIAGNOSIS — Z951 Presence of aortocoronary bypass graft: Secondary | ICD-10-CM | POA: Insufficient documentation

## 2022-03-08 DIAGNOSIS — I4891 Unspecified atrial fibrillation: Secondary | ICD-10-CM | POA: Diagnosis not present

## 2022-03-08 DIAGNOSIS — Z7901 Long term (current) use of anticoagulants: Secondary | ICD-10-CM | POA: Diagnosis not present

## 2022-03-08 DIAGNOSIS — I2510 Atherosclerotic heart disease of native coronary artery without angina pectoris: Secondary | ICD-10-CM | POA: Insufficient documentation

## 2022-03-08 DIAGNOSIS — I4892 Unspecified atrial flutter: Secondary | ICD-10-CM | POA: Diagnosis not present

## 2022-03-08 DIAGNOSIS — I739 Peripheral vascular disease, unspecified: Secondary | ICD-10-CM | POA: Insufficient documentation

## 2022-03-08 DIAGNOSIS — D6869 Other thrombophilia: Secondary | ICD-10-CM

## 2022-03-08 DIAGNOSIS — I10 Essential (primary) hypertension: Secondary | ICD-10-CM | POA: Insufficient documentation

## 2022-03-08 DIAGNOSIS — Z01810 Encounter for preprocedural cardiovascular examination: Secondary | ICD-10-CM | POA: Diagnosis not present
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
## 2022-03-13 ENCOUNTER — Telehealth (HOSPITAL_COMMUNITY): Payer: Self-pay | Admitting: *Deleted

## 2022-03-13 DIAGNOSIS — Z20822 Contact with and (suspected) exposure to covid-19: Secondary | ICD-10-CM | POA: Diagnosis not present

## 2022-03-13 MED ORDER — SPIRONOLACTONE 25 MG PO TABS: 12.5000 mg | ORAL_TABLET | Freq: Every day | ORAL | 0 refills | Status: DC | PRN

## 2022-03-13 NOTE — Telephone Encounter (Signed)
Pt continues with lower extremity swelling. Per Adline Peals PA will use PRN spironolactone 12.5mg  daily for the next 3 days. Pt in agreement.  ?

## 2022-03-15 ENCOUNTER — Other Ambulatory Visit: Payer: Self-pay | Admitting: Internal Medicine

## 2022-03-17 ENCOUNTER — Telehealth: Payer: Self-pay | Admitting: Cardiovascular Disease

## 2022-03-17 NOTE — Telephone Encounter (Signed)
Spoke to patient . Reviewed patient's [facility]  chart. ?  Cardioversion Procedure - 01/04/2017   ( under  the tab-- surgeries )  ? ?Patient verbalized understanding ?

## 2022-03-17 NOTE — Telephone Encounter (Signed)
Patient called and wanted to know when the last time she had a cardioversion...patient is saying she thought back in 2018 ?

## 2022-03-22 ENCOUNTER — Other Ambulatory Visit: Payer: Self-pay | Admitting: Internal Medicine

## 2022-03-23 ENCOUNTER — Ambulatory Visit (INDEPENDENT_AMBULATORY_CARE_PROVIDER_SITE_OTHER): Payer: Medicare Other

## 2022-03-23 DIAGNOSIS — Z5181 Encounter for therapeutic drug level monitoring: Secondary | ICD-10-CM | POA: Diagnosis not present

## 2022-03-23 DIAGNOSIS — I480 Paroxysmal atrial fibrillation: Secondary | ICD-10-CM

## 2022-03-23 LAB — POCT INR: INR: 1.8 — AB (ref 2.0–3.0)

## 2022-03-23 NOTE — Patient Instructions (Signed)
TAKE 2 TABLETS TONIGHT ONLY and then Continue warfarin 1 tablet daily except for a 1/2 on Tuesday and Saturday. Recheck INR in 3 weeks. [facility] Clinic [phone];   ?

## 2022-03-24 ENCOUNTER — Encounter: Payer: Self-pay | Admitting: *Deleted

## 2022-03-27 ENCOUNTER — Telehealth: Payer: Self-pay | Admitting: Cardiovascular Disease

## 2022-03-27 NOTE — Telephone Encounter (Signed)
Called pt. She states she had to decrease Lisinopril to 10 mg in the am and 20 mg at night but since the ablation she has done back t normal and had increased her dose back to 20 mg in the am and 20 mg at night. Asked pt if a provider told her to increased back to 20 mg twice daily. She states "no, but I have been a heart patient for years and I know not to take the regular dose if my blood pressure is too low."   Pt wants a prescription for Lisinopril 20 mg in the morning and 20 mg at night. Will get message to Dr. Harl Bowie for review.  ?

## 2022-03-27 NOTE — Telephone Encounter (Signed)
Pt c/o medication issue: ? ?1. Name of Medication: lisinopril (ZESTRIL) 20 MG tablet ? ?2. How are you currently taking this medication (dosage and times per day)? TAKE 1/2 TABLET (10 MG) IN THE MORNING AND 1 TABLET (20 MG) AT BEDTIME ? ?3. Are you having a reaction (difficulty breathing--STAT)? no ? ?4. What is your medication issue? Patient states this is the dosage for the old prescription. ? ?

## 2022-03-28 ENCOUNTER — Encounter: Payer: Self-pay | Admitting: Internal Medicine

## 2022-03-28 ENCOUNTER — Ambulatory Visit: Payer: Medicare Other | Admitting: Cardiovascular Disease

## 2022-03-30 ENCOUNTER — Telehealth: Payer: Self-pay | Admitting: Cardiology

## 2022-03-30 NOTE — Telephone Encounter (Signed)
Pt would like a callback regarding previous visits that insurance has questions about. Please advise

## 2022-03-30 NOTE — Telephone Encounter (Signed)
Super\visor reached out to the billing department who advised: The message can be forwarded to the patient accounting Supervisor Carolann Littler and the patient can call [phone]  Called the patient with the above information and will forward message to [facility]. Patient verbalized understanding.

## 2022-03-30 NOTE — Telephone Encounter (Signed)
Spoke with the patient who reports that when she had her ablation done on March 29th she had some complications that caused her to stay overnight. She states that she was given several medications in hospital that she is being charged for. She states that it is due to the claims being filed as outpatient. She states that the file needs to be reclaimed as inpatient since she had to stay overnight and it was medically necessary. She states that there was $370 worth of medication that she is being charged for. She states one of them was Tylenol but not sure what the other were. Advised that I would send this information over to the appropriate person to assist her.

## 2022-04-05 DIAGNOSIS — Z20828 Contact with and (suspected) exposure to other viral communicable diseases: Secondary | ICD-10-CM | POA: Diagnosis not present

## 2022-04-07 ENCOUNTER — Telehealth: Payer: Self-pay | Admitting: Cardiology

## 2022-04-07 NOTE — Telephone Encounter (Signed)
Spent over 13 min with pt. Pt calling in to report she is receiving a bill to pay for medications d/t having to stay overnight after her ablation in March. States she pays a lot for her supplemental plan and has "never had to pay for her medications when staying at the hospital. She states the insurance told her it is because Dr. Curt Bears labeled it as an outpatient procedure/medications --- she needs him to change this to an inpatient occurrence/procedure. States she had to stay overnight due to "coagulation problems and breathing problems" in which she received breathing treatments. Explained that I will discuss this with Dr. Curt Bears and will see if he wants to just discuss this via phone call/mychart/OV. Pt aware I will call her back next week once reviewed with Dr. Curt Bears. Patient verbalized understanding and agreeable to plan.

## 2022-04-07 NOTE — Telephone Encounter (Signed)
Patient requests a call back to discuss her procedure 3/29.

## 2022-04-11 ENCOUNTER — Ambulatory Visit (INDEPENDENT_AMBULATORY_CARE_PROVIDER_SITE_OTHER): Payer: Medicare Other | Admitting: Pharmacist Clinician (PhC)/ Clinical Pharmacy Specialist

## 2022-04-11 ENCOUNTER — Ambulatory Visit (INDEPENDENT_AMBULATORY_CARE_PROVIDER_SITE_OTHER): Payer: Medicare Other | Admitting: Cardiovascular Disease

## 2022-04-11 ENCOUNTER — Encounter: Payer: Self-pay | Admitting: Cardiovascular Disease

## 2022-04-11 VITALS — BP 140/66 | HR 68 | Ht 63.0 in | Wt 158.2 lb

## 2022-04-11 DIAGNOSIS — Z5181 Encounter for therapeutic drug level monitoring: Secondary | ICD-10-CM | POA: Diagnosis not present

## 2022-04-11 DIAGNOSIS — I2510 Atherosclerotic heart disease of native coronary artery without angina pectoris: Secondary | ICD-10-CM

## 2022-04-11 DIAGNOSIS — E785 Hyperlipidemia, unspecified: Secondary | ICD-10-CM

## 2022-04-11 DIAGNOSIS — Z20828 Contact with and (suspected) exposure to other viral communicable diseases: Secondary | ICD-10-CM | POA: Diagnosis not present

## 2022-04-11 DIAGNOSIS — I10 Essential (primary) hypertension: Secondary | ICD-10-CM | POA: Diagnosis not present

## 2022-04-11 DIAGNOSIS — I480 Paroxysmal atrial fibrillation: Secondary | ICD-10-CM | POA: Diagnosis not present

## 2022-04-11 DIAGNOSIS — I739 Peripheral vascular disease, unspecified: Secondary | ICD-10-CM | POA: Diagnosis not present

## 2022-04-11 LAB — POCT INR: INR: 2.2 (ref 2.0–3.0)
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
## 2022-04-11 NOTE — Patient Instructions (Signed)
Medication Instructions:  No changes *If you need a refill on your cardiac medications before your next appointment, please call your pharmacy*   Lab Work: None ordered If you have labs (blood work) drawn today and your tests are completely normal, you will receive your results only by: [place] (if you have MyChart) OR A paper copy in the mail If you have any lab test that is abnormal or we need to change your treatment, we will call you to review the results.   Testing/Procedures: None ordered   Follow-Up: At [facility], you and your health needs are our priority.  As part of our continuing mission to provide you with exceptional heart care, we have created designated Provider Care Teams.  These Care Teams include your primary Cardiologist (physician) and Advanced Practice Providers (APPs -  Physician Assistants and Nurse Practitioners) who all work together to provide you with the care you need, when you need it.  We recommend signing up for the patient portal called "MyChart".  Sign up information is provided on this After Visit Summary.  MyChart is used to connect with patients for Virtual Visits (Telemedicine).  Patients are able to view lab/test results, encounter notes, upcoming appointments, etc.  Non-urgent messages can be sent to your provider as well.   To learn more about what you can do with MyChart, go to [email or web address].    Your next appointment:   6 month(s)  The format for your next appointment:   In Person  Provider:   Dr. Fletcher Anon  Important Information About Sugar

## 2022-04-12 ENCOUNTER — Encounter: Payer: Self-pay | Admitting: *Deleted

## 2022-04-13 ENCOUNTER — Encounter: Payer: Self-pay | Admitting: Cardiology

## 2022-04-19 DIAGNOSIS — R791 Abnormal coagulation profile: Secondary | ICD-10-CM | POA: Diagnosis not present

## 2022-04-19 DIAGNOSIS — C3432 Malignant neoplasm of lower lobe, left bronchus or lung: Secondary | ICD-10-CM | POA: Diagnosis not present

## 2022-04-19 DIAGNOSIS — I2510 Atherosclerotic heart disease of native coronary artery without angina pectoris: Secondary | ICD-10-CM | POA: Diagnosis not present

## 2022-04-19 DIAGNOSIS — I480 Paroxysmal atrial fibrillation: Secondary | ICD-10-CM | POA: Diagnosis not present

## 2022-04-19 DIAGNOSIS — C9110 Chronic lymphocytic leukemia of B-cell type not having achieved remission: Secondary | ICD-10-CM | POA: Diagnosis not present

## 2022-04-20 ENCOUNTER — Telehealth: Payer: Self-pay

## 2022-04-20 NOTE — Telephone Encounter (Signed)
Patient left a message and would like to talk with you about some lab results she got recently.  Please call to discuss.  Thank you

## 2022-04-24 NOTE — Telephone Encounter (Signed)
Returned call to patient - could not recall what question was.  Will call back if recalls

## 2022-04-26 ENCOUNTER — Encounter: Payer: Self-pay | Admitting: Internal Medicine

## 2022-04-26 ENCOUNTER — Ambulatory Visit (INDEPENDENT_AMBULATORY_CARE_PROVIDER_SITE_OTHER): Payer: Medicare Other | Admitting: Internal Medicine

## 2022-04-26 VITALS — BP 124/78 | HR 67 | Ht 63.0 in | Wt 157.2 lb

## 2022-04-26 DIAGNOSIS — I2510 Atherosclerotic heart disease of native coronary artery without angina pectoris: Secondary | ICD-10-CM

## 2022-04-26 MED ORDER — LISINOPRIL 20 MG PO TABS: 20.0000 mg | ORAL_TABLET | Freq: Two times a day (BID) | ORAL | 11 refills | Status: DC
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
## 2022-04-26 NOTE — Patient Instructions (Signed)
Medication Instructions:  OKAY TO TAKE LISINOPRIL 20mg  TWICE DAILY  *If you need a refill on your cardiac medications before your next appointment, please call your pharmacy*  Lab Work: None Ordered At This Time.  If you have labs (blood work) drawn today and your tests are completely normal, you will receive your results only by: [place] (if you have MyChart) OR A paper copy in the mail If you have any lab test that is abnormal or we need to change your treatment, we will call you to review the results.  Testing/Procedures: None Ordered At This Time.   Follow-Up: At [facility], you and your health needs are our priority.  As part of our continuing mission to provide you with exceptional heart care, we have created designated Provider Care Teams.  These Care Teams include your primary Cardiologist (physician) and Advanced Practice Providers (APPs -  Physician Assistants and Nurse Practitioners) who all work together to provide you with the care you need, when you need it.  Your next appointment:   1 year(s)  The format for your next appointment:   In Person  Provider:   Janina Mayo, MD

## 2022-04-28 ENCOUNTER — Encounter: Payer: Self-pay | Admitting: Internal Medicine

## 2022-04-29 ENCOUNTER — Other Ambulatory Visit: Payer: Self-pay | Admitting: Internal Medicine

## 2022-05-04 ENCOUNTER — Telehealth: Payer: Self-pay | Admitting: Internal Medicine

## 2022-05-04 NOTE — Telephone Encounter (Signed)
Patient states that she was reviewing her insurance and on her blue cross and blue shield summary she noticed that it showed Dr. Claiborne Billings ordered chemistries on 01/18/22 and 03/23/22. She has not seen Dr. Claiborne Billings in a year and wants to know what this is. Please advise.

## 2022-05-04 NOTE — Telephone Encounter (Signed)
Patient requesting to switch from Dr. Harl Bowie to Dr. Johney Frame.

## 2022-05-08 ENCOUNTER — Encounter: Payer: Self-pay | Admitting: Cardiology

## 2022-05-08 ENCOUNTER — Ambulatory Visit (INDEPENDENT_AMBULATORY_CARE_PROVIDER_SITE_OTHER): Payer: Medicare Other | Admitting: Cardiology

## 2022-05-08 VITALS — BP 144/80 | HR 64 | Ht 63.0 in | Wt 156.8 lb

## 2022-05-08 DIAGNOSIS — I2510 Atherosclerotic heart disease of native coronary artery without angina pectoris: Secondary | ICD-10-CM | POA: Diagnosis not present

## 2022-05-08 DIAGNOSIS — I480 Paroxysmal atrial fibrillation: Secondary | ICD-10-CM | POA: Diagnosis not present

## 2022-05-08 DIAGNOSIS — D6869 Other thrombophilia: Secondary | ICD-10-CM

## 2022-05-11 ENCOUNTER — Encounter: Payer: Self-pay | Admitting: Cardiology

## 2022-05-11 ENCOUNTER — Ambulatory Visit (INDEPENDENT_AMBULATORY_CARE_PROVIDER_SITE_OTHER): Payer: Medicare Other

## 2022-05-11 DIAGNOSIS — Z5181 Encounter for therapeutic drug level monitoring: Secondary | ICD-10-CM | POA: Diagnosis not present

## 2022-05-11 DIAGNOSIS — I480 Paroxysmal atrial fibrillation: Secondary | ICD-10-CM | POA: Diagnosis not present

## 2022-05-11 LAB — POCT INR: INR: 1.8 — AB (ref 2.0–3.0)

## 2022-05-11 NOTE — Patient Instructions (Signed)
Description   Take 1.5 tablets today and then continue warfarin 1 tablet daily except for a 1/2 on Tuesday and Saturday. Recheck INR in 4 weeks. [facility] [phone];

## 2022-05-15 NOTE — Telephone Encounter (Signed)
Patient following up, please advise. Thank you :)

## 2022-05-29 ENCOUNTER — Telehealth (HOSPITAL_COMMUNITY): Payer: Self-pay | Admitting: Internal Medicine

## 2022-05-29 MED ORDER — EZETIMIBE 10 MG PO TABS: 10.0000 mg | ORAL_TABLET | Freq: Every day | ORAL | 1 refills | Status: DC

## 2022-05-29 NOTE — Telephone Encounter (Signed)
*  STAT* If patient is at the pharmacy, call can be transferred to refill team.   1. Which medications need to be refilled? (please list name of each medication and dose if known)   ezetimibe (ZETIA) 10 MG tablet (Expired)  2. Which pharmacy/location (including street and city if local pharmacy) is medication to be sent to?  [place] ([place] [place] [address]  3. Do they need a 30 day or 90 day supply?   90 day   Patient stated she has only 2 tablets left.
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
## 2022-06-07 ENCOUNTER — Ambulatory Visit (INDEPENDENT_AMBULATORY_CARE_PROVIDER_SITE_OTHER): Payer: Medicare Other

## 2022-06-07 DIAGNOSIS — I480 Paroxysmal atrial fibrillation: Secondary | ICD-10-CM

## 2022-06-07 DIAGNOSIS — Z5181 Encounter for therapeutic drug level monitoring: Secondary | ICD-10-CM

## 2022-06-07 LAB — POCT INR: INR: 1.8 — AB (ref 2.0–3.0)

## 2022-06-07 NOTE — Patient Instructions (Signed)
Take 1.5 tablets today and then continue warfarin 1 tablet daily except for a 1/2 on Tuesday and Saturday. Recheck INR in 4 weeks. [facility] [phone];

## 2022-06-08 ENCOUNTER — Ambulatory Visit (INDEPENDENT_AMBULATORY_CARE_PROVIDER_SITE_OTHER): Payer: Medicare Other | Admitting: Cardiology

## 2022-06-08 ENCOUNTER — Encounter: Payer: Self-pay | Admitting: Cardiology

## 2022-06-08 VITALS — BP 126/70 | HR 69 | Ht 63.0 in | Wt 156.6 lb

## 2022-06-08 DIAGNOSIS — D6869 Other thrombophilia: Secondary | ICD-10-CM | POA: Diagnosis not present

## 2022-06-08 DIAGNOSIS — I739 Peripheral vascular disease, unspecified: Secondary | ICD-10-CM

## 2022-06-08 DIAGNOSIS — I10 Essential (primary) hypertension: Secondary | ICD-10-CM | POA: Diagnosis not present

## 2022-06-08 DIAGNOSIS — I2510 Atherosclerotic heart disease of native coronary artery without angina pectoris: Secondary | ICD-10-CM

## 2022-06-08 DIAGNOSIS — E785 Hyperlipidemia, unspecified: Secondary | ICD-10-CM

## 2022-06-08 DIAGNOSIS — I480 Paroxysmal atrial fibrillation: Secondary | ICD-10-CM

## 2022-06-08 DIAGNOSIS — I771 Stricture of artery: Secondary | ICD-10-CM | POA: Diagnosis not present

## 2022-06-08 MED ORDER — FUROSEMIDE 20 MG PO TABS: 20.0000 mg | ORAL_TABLET | Freq: Every day | ORAL | 3 refills | Status: DC | PRN

## 2022-06-08 NOTE — Patient Instructions (Signed)
Medication Instructions:  Your physician has recommended you make the following change in your medication: 1) START taking Lasix (furosemide) 20 mg as needed for swelling.  *If you need a refill on your cardiac medications before your next appointment, please call your pharmacy*  Follow-Up: At [facility], you and your health needs are our priority.  As part of our continuing mission to provide you with exceptional heart care, we have created designated Provider Care Teams.  These Care Teams include your primary Cardiologist (physician) and Advanced Practice Providers (APPs -  Physician Assistants and Nurse Practitioners) who all work together to provide you with the care you need, when you need it.  Your next appointment:   6 month(s)  The format for your next appointment:   In Person  Provider:   Gwyndolyn Kaufman, MD   Important Information About Sugar

## 2022-06-11 ENCOUNTER — Encounter: Payer: Self-pay | Admitting: Cardiology

## 2022-06-11 DIAGNOSIS — Z79899 Other long term (current) drug therapy: Secondary | ICD-10-CM

## 2022-06-11 DIAGNOSIS — E785 Hyperlipidemia, unspecified: Secondary | ICD-10-CM

## 2022-06-11 DIAGNOSIS — I2510 Atherosclerotic heart disease of native coronary artery without angina pectoris: Secondary | ICD-10-CM

## 2022-06-11 DIAGNOSIS — I10 Essential (primary) hypertension: Secondary | ICD-10-CM

## 2022-06-13 ENCOUNTER — Telehealth: Payer: Self-pay | Admitting: *Deleted

## 2022-06-13 NOTE — Addendum Note (Signed)
Addended by: Nuala Alpha on: 06/13/2022 07:33 AM   Modules accepted: Orders

## 2022-06-13 NOTE — Telephone Encounter (Signed)
Returned call to the pt and she stated she has been eating 6-8 large prunes daily and will continue but thinks this has been keeping her INR low, then she stated she was taking cinnamon but stopped it years ago. Advised that she should continue normal warfarin regime and intake and when we see her again if INR is still low than we will change her INR dose at that time. She verbalized understanding.

## 2022-06-26 ENCOUNTER — Other Ambulatory Visit: Payer: Medicare Other

## 2022-06-26 DIAGNOSIS — Z79899 Other long term (current) drug therapy: Secondary | ICD-10-CM | POA: Diagnosis not present

## 2022-06-26 DIAGNOSIS — E785 Hyperlipidemia, unspecified: Secondary | ICD-10-CM

## 2022-06-26 DIAGNOSIS — I2510 Atherosclerotic heart disease of native coronary artery without angina pectoris: Secondary | ICD-10-CM

## 2022-06-26 DIAGNOSIS — I10 Essential (primary) hypertension: Secondary | ICD-10-CM | POA: Diagnosis not present

## 2022-06-26 LAB — LIPID PANEL
Chol/HDL Ratio: 2.4 ratio (ref 0.0–4.4)
Cholesterol, Total: 159 mg/dL (ref 100–199)
HDL: 65 mg/dL (ref >39)
LDL Chol Calc (NIH): 81 mg/dL (ref 0–99)
Triglycerides: 66 mg/dL (ref 0–149)
VLDL Cholesterol Cal: 13 mg/dL (ref 5–40)

## 2022-06-26 LAB — BASIC METABOLIC PANEL
BUN/Creatinine Ratio: 19 (ref 12–28)
BUN: 13 mg/dL (ref 8–27)
CO2: 24 mmol/L (ref 20–29)
Calcium: 8.7 mg/dL (ref 8.7–10.3)
Chloride: 97 mmol/L (ref 96–106)
Creatinine, Ser: 0.69 mg/dL (ref 0.57–1.00)
Glucose: 92 mg/dL (ref 70–99)
Potassium: 4.5 mmol/L (ref 3.5–5.2)
Sodium: 133 mmol/L — ABNORMAL LOW (ref 134–144)
eGFR: 89 mL/min/{1.73_m2} (ref >59)

## 2022-06-27 ENCOUNTER — Telehealth: Payer: Self-pay | Admitting: Pulmonary Disease

## 2022-06-27 NOTE — Telephone Encounter (Signed)
Called patient and she does have some questions on regards to pulmonary hypertension. She is wondering if by chance if she has this.   Please advise  I did inform her that the only way pulmonary htn can be diagnosed in on an echocardiogram and that I know this due to me working with a doctor that specializes in pulmonary htn.

## 2022-06-27 NOTE — Telephone Encounter (Signed)
Called and spoke with patient and got her set up for a new pt appt with MH for Pulm HTN. Nothing further needed

## 2022-06-28 ENCOUNTER — Telehealth: Payer: Self-pay | Admitting: Cardiovascular Disease

## 2022-06-28 NOTE — Telephone Encounter (Signed)
Pt is calling to endorse that she called her Pulmonologist today to discuss her history of Pulmonary HTN and to ask when exactly was she diagnosed with this.   She states she's had this for years, but wanted to discuss this more in depth with them, to figure out exactly when she was diagnosed with this and treatment going forth.  She said she spoke with Dr. Juline Patch RN and she was able to assist her in getting an appt with a Pulmonary HTN Specialist in their clinic, to further discuss.  Pt will be seeing Dr. Silas Flood in Sept for further discussion and management of her Pulm HTN.    Pt just wanted to let us know and was very appreciative for all the assistance we provided in guiding her to reach out to them.

## 2022-06-28 NOTE — Telephone Encounter (Signed)
Returned the call to the patient. She stated she was calling to speak with Ivy. Message sent.

## 2022-06-28 NOTE — Telephone Encounter (Signed)
Patient called to talk with Dr. Fletcher Anon or nurse. Would not disclose what the phone call was for

## 2022-07-05 ENCOUNTER — Ambulatory Visit (INDEPENDENT_AMBULATORY_CARE_PROVIDER_SITE_OTHER): Payer: Medicare Other

## 2022-07-05 DIAGNOSIS — L309 Dermatitis, unspecified: Secondary | ICD-10-CM | POA: Diagnosis not present

## 2022-07-05 DIAGNOSIS — L570 Actinic keratosis: Secondary | ICD-10-CM | POA: Diagnosis not present

## 2022-07-05 DIAGNOSIS — Z5181 Encounter for therapeutic drug level monitoring: Secondary | ICD-10-CM | POA: Diagnosis not present

## 2022-07-05 DIAGNOSIS — I480 Paroxysmal atrial fibrillation: Secondary | ICD-10-CM

## 2022-07-05 DIAGNOSIS — D2371 Other benign neoplasm of skin of right lower limb, including hip: Secondary | ICD-10-CM | POA: Diagnosis not present

## 2022-07-05 DIAGNOSIS — Z85828 Personal history of other malignant neoplasm of skin: Secondary | ICD-10-CM | POA: Diagnosis not present

## 2022-07-05 DIAGNOSIS — L821 Other seborrheic keratosis: Secondary | ICD-10-CM | POA: Diagnosis not present

## 2022-07-05 DIAGNOSIS — X32XXXA Exposure to sunlight, initial encounter: Secondary | ICD-10-CM | POA: Diagnosis not present

## 2022-07-05 DIAGNOSIS — B351 Tinea unguium: Secondary | ICD-10-CM | POA: Diagnosis not present

## 2022-07-05 LAB — POCT INR: INR: 1.7 — AB (ref 2.0–3.0)

## 2022-07-05 NOTE — Patient Instructions (Signed)
Take 1.5 tablets today and then increase to 1 tablet daily. Recheck INR in 2 weeks. [facility] [phone];

## 2022-07-13 DIAGNOSIS — Z961 Presence of intraocular lens: Secondary | ICD-10-CM | POA: Diagnosis not present

## 2022-07-13 DIAGNOSIS — M3501 Sicca syndrome with keratoconjunctivitis: Secondary | ICD-10-CM | POA: Diagnosis not present

## 2022-07-19 DIAGNOSIS — C9110 Chronic lymphocytic leukemia of B-cell type not having achieved remission: Secondary | ICD-10-CM | POA: Diagnosis not present

## 2022-07-24 ENCOUNTER — Ambulatory Visit: Payer: Medicare Other | Attending: Cardiovascular Disease

## 2022-07-24 ENCOUNTER — Other Ambulatory Visit: Payer: Self-pay | Admitting: Internal Medicine

## 2022-07-24 ENCOUNTER — Encounter: Payer: Self-pay | Admitting: Pulmonary Disease

## 2022-07-24 ENCOUNTER — Ambulatory Visit (INDEPENDENT_AMBULATORY_CARE_PROVIDER_SITE_OTHER): Payer: Medicare Other | Admitting: Pulmonary Disease

## 2022-07-24 VITALS — BP 118/62 | HR 63 | Ht 63.0 in | Wt 154.0 lb

## 2022-07-24 DIAGNOSIS — Z5181 Encounter for therapeutic drug level monitoring: Secondary | ICD-10-CM | POA: Insufficient documentation

## 2022-07-24 DIAGNOSIS — I2729 Other secondary pulmonary hypertension: Secondary | ICD-10-CM | POA: Diagnosis not present

## 2022-07-24 DIAGNOSIS — I480 Paroxysmal atrial fibrillation: Secondary | ICD-10-CM | POA: Diagnosis not present

## 2022-07-24 LAB — POCT INR: INR: 2.8 (ref 2.0–3.0)

## 2022-07-24 NOTE — Patient Instructions (Signed)
Nice to meet you  We discussed pulmonary hypertension and the many different conditions that can contribute to this diagnosis.  Based on review of your serial echocardiograms dating back to 2015, I think is most likely that the largest contributor to pulmonary hypertension and you as group 2 disease or fluid backup from the left side of the heart.  In addition, there could be a small contribution from emphysema although I think this is minor.  Especially based on your breathing tests more recently in 2022.  Based on all the information available to me, I do not think additional testing such as right heart catheterization will be beneficial as I do not feel medications are pulmonary vasodilators will be very helpful.  Is quite possible these medications will make her symptoms worse.  To further evaluate and round out testing for pulmonary hypertension, I have ordered a high-res CT scan of your chest to evaluate for scarring in the lungs or other causes of pulmonary hypertension as well as a nuclear medicine ventilation/perfusion scan to check for possible blood clots in the past.  I think it is unlikely this will show anything but I think it is worth making sure.  Return to clinic in 2 months or sooner as needed with Dr. Silas Flood

## 2022-07-24 NOTE — Patient Instructions (Signed)
CONTINUE 1 tablet daily. Recheck INR in 3 weeks. [facility] [phone];
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
## 2022-07-25 ENCOUNTER — Telehealth: Payer: Self-pay | Admitting: Pulmonary Disease

## 2022-07-25 DIAGNOSIS — I2729 Other secondary pulmonary hypertension: Secondary | ICD-10-CM

## 2022-07-25 NOTE — Telephone Encounter (Signed)
Called and spoke with Butch Penny, [place].  Order placed for NM perfusion needs to be changed and signed off by Dr. Silas Flood before patient appointment.  Patient has been scheduled HRCT and NM 08/09/22.  New order NM perf particulate [id number] placed.     Message routed to Dr. Silas Flood to sign off requested NM order.

## 2022-07-31 ENCOUNTER — Other Ambulatory Visit: Payer: Medicare Other

## 2022-08-01 ENCOUNTER — Telehealth: Payer: Self-pay | Admitting: Cardiology

## 2022-08-01 DIAGNOSIS — E785 Hyperlipidemia, unspecified: Secondary | ICD-10-CM

## 2022-08-01 DIAGNOSIS — Z79899 Other long term (current) drug therapy: Secondary | ICD-10-CM

## 2022-08-01 DIAGNOSIS — I2510 Atherosclerotic heart disease of native coronary artery without angina pectoris: Secondary | ICD-10-CM

## 2022-08-01 NOTE — Telephone Encounter (Signed)
Will send this message to Dr. Johney Frame to make her aware of this plan.

## 2022-08-01 NOTE — Telephone Encounter (Signed)
Pt calling to ask if she could have her lipids rechecked sometime soon.  She last had them done back on 8/14 (see detailed result note from then) where she wanted to work on lifestyle modifications have have them checked again, before altering any cholesterol meds, as Dr. Johney Frame advised her to at that time.  Pt states she wants to have them rechecked in 3 months from the last lipid check on 8/14, even though this was not ordered to be done at that time frame.  She wants to have her lipids checked on the week of 11/13 at a [facility] in [place], then schedule a follow-up appt with Dr. Johney Frame for the following week, to further discuss her lipid results and management.  She only wants to see Dr. Johney Frame and is aware that 3 month lipid check not indicated based off of 8/14 lipid results.   Informed the pt that I will place an order for her to have her lipids rechecked on the week of 11/13 at a [facility] near her.  Order placed and released in the system.  Pt aware to go to a [facility] that week to have this done, and make sure she goes fasting.  Order placed and released in the system for [facility] to do on the pt for the week of 11/13.  Scheduled her a follow-up appt with Dr. Johney Frame for the following week on 10/02/22 at 1:20 pm.  She is aware to arrive 15 mins prior to that appt.   Pt verbalized understanding and agrees with this plan.  Pt was more than gracious for all the assistance provided.

## 2022-08-01 NOTE — Telephone Encounter (Signed)
Patient stated she will need order for the lab work to go to [facility] on [address] in [place].

## 2022-08-02 DIAGNOSIS — Z23 Encounter for immunization: Secondary | ICD-10-CM | POA: Diagnosis not present

## 2022-08-02 NOTE — Telephone Encounter (Signed)
Cristiana, Yochim - 08/01/2022  4:15 PM Freada Bergeron, MD  Sent: Tue August 01, 2022  7:39 PM  To: Nuala Alpha, LPN          Message  Thank you so much for letting me know!

## 2022-08-04 ENCOUNTER — Other Ambulatory Visit: Payer: Self-pay | Admitting: Internal Medicine

## 2022-08-08 ENCOUNTER — Ambulatory Visit: Payer: Medicare Other | Admitting: Internal Medicine

## 2022-08-09 ENCOUNTER — Other Ambulatory Visit: Payer: Medicare Other

## 2022-08-09 ENCOUNTER — Encounter
Admission: RE | Admit: 2022-08-09 | Discharge: 2022-08-09 | Disposition: A | Discharge status: Home / Self Care | Payer: Medicare Other | Source: Ambulatory Visit | Attending: Pulmonary Disease | Admitting: Pulmonary Disease

## 2022-08-09 ENCOUNTER — Ambulatory Visit
Admission: RE | Admit: 2022-08-09 | Discharge: 2022-08-09 | Disposition: A | Discharge status: Home / Self Care | Payer: Medicare Other | Source: Ambulatory Visit | Attending: Pulmonary Disease | Admitting: Pulmonary Disease

## 2022-08-09 DIAGNOSIS — I700 Atherosclerosis of aorta: Secondary | ICD-10-CM | POA: Diagnosis not present

## 2022-08-09 DIAGNOSIS — J90 Pleural effusion, not elsewhere classified: Secondary | ICD-10-CM | POA: Diagnosis not present

## 2022-08-09 DIAGNOSIS — I2729 Other secondary pulmonary hypertension: Secondary | ICD-10-CM | POA: Insufficient documentation

## 2022-08-09 DIAGNOSIS — I270 Primary pulmonary hypertension: Secondary | ICD-10-CM | POA: Diagnosis not present

## 2022-08-09 DIAGNOSIS — K449 Diaphragmatic hernia without obstruction or gangrene: Secondary | ICD-10-CM | POA: Diagnosis not present

## 2022-08-09 DIAGNOSIS — J432 Centrilobular emphysema: Secondary | ICD-10-CM | POA: Diagnosis not present

## 2022-08-09 MED ORDER — TECHNETIUM TO 99M ALBUMIN AGGREGATED
4.0000 | Freq: Once | INTRAVENOUS | Status: AC | PRN
  Administered 2022-08-09: 4.38 via INTRAVENOUS

## 2022-08-10 NOTE — Progress Notes (Signed)
No evidence of pulmonary embolus which was expected but is good news!

## 2022-08-10 NOTE — Progress Notes (Signed)
No evidence of scarring or fibrosis in the lung - which is good news!

## 2022-08-14 ENCOUNTER — Telehealth: Payer: Self-pay | Admitting: Cardiology

## 2022-08-14 NOTE — Telephone Encounter (Signed)
Patient had a nuclear test done and patient would like for dr. Johney Frame to look over it and give her a call. Please advise

## 2022-08-14 NOTE — Telephone Encounter (Signed)
Imane, Burrough - 08/14/2022 11:35 AM Freada Bergeron, MD  Sent: Mon August 14, 2022  3:12 PM  To: Nuala Alpha, LPN          Message  It looks like the test was negative for any evidence of blood clots in her lungs. That is great news    The patient has been notified of the result and verbalized understanding.  All questions (if any) were answered. Nuala Alpha, LPN 92/07/2445 2:86 PM

## 2022-08-14 NOTE — Telephone Encounter (Signed)
Dr. Johney Frame, pt had a nuclear Pulmonary Perfusion test done by her Pulmonologist on 9/27.  Dr. Silas Flood her Pulmonologist resulted this test, and everything came back within-normal-limits. Below is copied result from this test.  Pt is calling today to ask if you would also review the results and advise.   Please review and advise.    Thanks!   Lanier Clam, MD  08/10/2022  5:01 PM EDT     No evidence of pulmonary embolus which was expected but is good news!

## 2022-08-15 DIAGNOSIS — Z23 Encounter for immunization: Secondary | ICD-10-CM | POA: Diagnosis not present

## 2022-08-16 ENCOUNTER — Ambulatory Visit: Payer: Medicare Other | Attending: Cardiovascular Disease

## 2022-08-16 DIAGNOSIS — I480 Paroxysmal atrial fibrillation: Secondary | ICD-10-CM | POA: Diagnosis not present

## 2022-08-16 DIAGNOSIS — Z5181 Encounter for therapeutic drug level monitoring: Secondary | ICD-10-CM

## 2022-08-16 LAB — POCT INR: INR: 2.8 (ref 2.0–3.0)

## 2022-08-16 NOTE — Patient Instructions (Signed)
CONTINUE 1 tablet daily. Recheck INR in 4 weeks. [facility]in Clinic [phone];  Eat greens tonight;

## 2022-08-17 ENCOUNTER — Other Ambulatory Visit: Payer: Self-pay | Admitting: Internal Medicine

## 2022-08-24 ENCOUNTER — Telehealth: Payer: Self-pay | Admitting: Cardiology

## 2022-08-24 DIAGNOSIS — I480 Paroxysmal atrial fibrillation: Secondary | ICD-10-CM

## 2022-08-24 MED ORDER — WARFARIN SODIUM 2.5 MG PO TABS: ORAL_TABLET | ORAL | 2 refills | Status: DC

## 2022-08-24 NOTE — Telephone Encounter (Signed)
Prescription refill request received for warfarin Lov: 06/08/22 [name]) Next INR check:09/13/22  Warfarin tablet strength: 2.5mg   Appropriate dose and refill sent to requested pharmacy.

## 2022-08-24 NOTE — Telephone Encounter (Signed)
Pt c/o medication issue:  1. Name of Medication: warfarin (COUMADIN) 2.5 MG tablet  2. How are you currently taking this medication (dosage and times per day)? TAKE 1 TO 2 TABLETS BY MOUTH ONCE DAILY OR AS PRESCRIBED BY CLINIC  3. Are you having a reaction (difficulty breathing--STAT)? No   4. What is your medication issue? Patient needs a new 3 refill, 30 day prescription sent to  [facility] (N), [place] [address]

## 2022-09-04 ENCOUNTER — Telehealth: Payer: Self-pay | Admitting: Pulmonary Disease

## 2022-09-04 DIAGNOSIS — E785 Hyperlipidemia, unspecified: Secondary | ICD-10-CM | POA: Diagnosis not present

## 2022-09-04 DIAGNOSIS — Z0000 Encounter for general adult medical examination without abnormal findings: Secondary | ICD-10-CM | POA: Diagnosis not present

## 2022-09-04 DIAGNOSIS — I480 Paroxysmal atrial fibrillation: Secondary | ICD-10-CM | POA: Diagnosis not present

## 2022-09-04 DIAGNOSIS — I2510 Atherosclerotic heart disease of native coronary artery without angina pectoris: Secondary | ICD-10-CM | POA: Diagnosis not present

## 2022-09-04 DIAGNOSIS — C9110 Chronic lymphocytic leukemia of B-cell type not having achieved remission: Secondary | ICD-10-CM | POA: Diagnosis not present

## 2022-09-04 DIAGNOSIS — I10 Essential (primary) hypertension: Secondary | ICD-10-CM | POA: Diagnosis not present

## 2022-09-04 MED ORDER — ANORO ELLIPTA 62.5-25 MCG/ACT IN AEPB: 1.0000 | INHALATION_SPRAY | Freq: Every day | RESPIRATORY_TRACT | 5 refills | Status: DC

## 2022-09-04 NOTE — Telephone Encounter (Signed)
Called and spoke to patient and went over that she needed her refill of Anoro sent in. Verified pharmacy with patient. Nothing further needed

## 2022-09-05 ENCOUNTER — Ambulatory Visit: Payer: Medicare Other | Admitting: Cardiology

## 2022-09-07 ENCOUNTER — Telehealth: Payer: Self-pay | Admitting: Cardiology

## 2022-09-07 MED ORDER — METOPROLOL SUCCINATE ER 25 MG PO TB24: 25.0000 mg | ORAL_TABLET | Freq: Every day | ORAL | 2 refills | Status: DC

## 2022-09-07 NOTE — Telephone Encounter (Signed)
*  STAT* If patient is at the pharmacy, call can be transferred to refill team.   1. Which medications need to be refilled? (please list name of each medication and dose if known)   metoprolol succinate (TOPROL-XL) 25 MG 24 hr tablet    2. Which pharmacy/location (including street and city if local pharmacy) is medication to be sent to? [facility] (N), [place] - [address]  3. Do they need a 30 day or 90 day supply?  90 day

## 2022-09-07 NOTE — Telephone Encounter (Signed)
Pt's medication was sent to pt's pharmacy as requested. Confirmation received.  °

## 2022-09-13 ENCOUNTER — Ambulatory Visit: Payer: Medicare Other | Attending: Cardiovascular Disease

## 2022-09-13 DIAGNOSIS — I480 Paroxysmal atrial fibrillation: Secondary | ICD-10-CM

## 2022-09-13 DIAGNOSIS — Z5181 Encounter for therapeutic drug level monitoring: Secondary | ICD-10-CM | POA: Diagnosis not present

## 2022-09-13 LAB — POCT INR: INR: 3 (ref 2.0–3.0)

## 2022-09-13 NOTE — Patient Instructions (Signed)
DECREASE TO 1 TABLET DAILY, EXCEPT 0.5 TABLET ON WEDNESDAY. Recheck INR in 4 weeks. Coumadin Clinic [phone];

## 2022-09-25 ENCOUNTER — Telehealth: Payer: Self-pay | Admitting: Cardiology

## 2022-09-25 ENCOUNTER — Ambulatory Visit (INDEPENDENT_AMBULATORY_CARE_PROVIDER_SITE_OTHER): Payer: Medicare Other | Admitting: Pulmonary Disease

## 2022-09-25 ENCOUNTER — Encounter: Payer: Self-pay | Admitting: Pulmonary Disease

## 2022-09-25 ENCOUNTER — Other Ambulatory Visit: Payer: Self-pay

## 2022-09-25 VITALS — BP 126/62 | HR 64 | Wt 154.6 lb

## 2022-09-25 DIAGNOSIS — I2729 Other secondary pulmonary hypertension: Secondary | ICD-10-CM

## 2022-09-25 DIAGNOSIS — I480 Paroxysmal atrial fibrillation: Secondary | ICD-10-CM

## 2022-09-25 MED ORDER — WARFARIN SODIUM 2.5 MG PO TABS: ORAL_TABLET | ORAL | 2 refills | Status: DC

## 2022-09-25 MED ORDER — ALBUTEROL SULFATE HFA 108 (90 BASE) MCG/ACT IN AERS: 2.0000 | INHALATION_SPRAY | Freq: Four times a day (QID) | RESPIRATORY_TRACT | 5 refills | Status: DC | PRN

## 2022-09-25 NOTE — Telephone Encounter (Signed)
 *  STAT* If patient is at the pharmacy, call can be transferred to refill team.   1. Which medications need to be refilled? (please list name of each medication and dose if known)   warfarin (COUMADIN) 2.5 MG tablet    2. Which pharmacy/location (including street and city if local pharmacy) is medication to be sent to?  [facility] (N), [place] - [address]    3. Do they need a 30 day or 90 day supply? 90 days

## 2022-09-25 NOTE — Patient Instructions (Signed)
Nice to see you again  I ordered a heart ultrasound or echocardiogram to reevaluate her screen for pulmonary hypertension now several months from your ablation  If it indicates the pressures remain high, I will send a message and have you scheduled for a right heart catheterization.  We will meet in clinic to discuss the results.  Return to clinic in 2 months or sooner as needed with Dr. Silas Flood
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
## 2022-09-26 ENCOUNTER — Encounter (HOSPITAL_COMMUNITY): Payer: Self-pay

## 2022-09-26 ENCOUNTER — Other Ambulatory Visit (HOSPITAL_COMMUNITY): Payer: Medicare Other

## 2022-09-27 ENCOUNTER — Ambulatory Visit (HOSPITAL_COMMUNITY): Payer: Medicare Other | Attending: Pulmonary Disease

## 2022-09-27 DIAGNOSIS — I2729 Other secondary pulmonary hypertension: Secondary | ICD-10-CM | POA: Diagnosis not present

## 2022-09-27 LAB — ECHOCARDIOGRAM COMPLETE
Area-P 1/2: 4.36 cm2
S' Lateral: 2.9 cm

## 2022-09-28 DIAGNOSIS — I2510 Atherosclerotic heart disease of native coronary artery without angina pectoris: Secondary | ICD-10-CM | POA: Diagnosis not present

## 2022-09-28 DIAGNOSIS — E785 Hyperlipidemia, unspecified: Secondary | ICD-10-CM | POA: Diagnosis not present

## 2022-09-28 DIAGNOSIS — Z79899 Other long term (current) drug therapy: Secondary | ICD-10-CM | POA: Diagnosis not present

## 2022-09-28 DIAGNOSIS — I252 Old myocardial infarction: Secondary | ICD-10-CM | POA: Diagnosis not present

## 2022-09-28 LAB — LIPID PANEL
Chol/HDL Ratio: 2.4 ratio (ref 0.0–4.4)
Cholesterol, Total: 151 mg/dL (ref 100–199)
HDL: 62 mg/dL (ref >39)
LDL Chol Calc (NIH): 76 mg/dL (ref 0–99)
Triglycerides: 67 mg/dL (ref 0–149)
VLDL Cholesterol Cal: 13 mg/dL (ref 5–40)
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
## 2022-10-02 ENCOUNTER — Encounter: Payer: Self-pay | Admitting: Cardiology

## 2022-10-02 ENCOUNTER — Telehealth: Payer: Self-pay | Admitting: Cardiology

## 2022-10-02 ENCOUNTER — Ambulatory Visit: Payer: Medicare Other | Attending: Cardiology | Admitting: Cardiology

## 2022-10-02 VITALS — BP 124/68 | HR 66 | Ht 63.0 in | Wt 154.0 lb

## 2022-10-02 DIAGNOSIS — I10 Essential (primary) hypertension: Secondary | ICD-10-CM | POA: Diagnosis not present

## 2022-10-02 DIAGNOSIS — I739 Peripheral vascular disease, unspecified: Secondary | ICD-10-CM

## 2022-10-02 DIAGNOSIS — I701 Atherosclerosis of renal artery: Secondary | ICD-10-CM | POA: Diagnosis not present

## 2022-10-02 DIAGNOSIS — I771 Stricture of artery: Secondary | ICD-10-CM | POA: Diagnosis not present

## 2022-10-02 DIAGNOSIS — I480 Paroxysmal atrial fibrillation: Secondary | ICD-10-CM

## 2022-10-02 DIAGNOSIS — D6869 Other thrombophilia: Secondary | ICD-10-CM | POA: Diagnosis not present

## 2022-10-02 DIAGNOSIS — E785 Hyperlipidemia, unspecified: Secondary | ICD-10-CM

## 2022-10-02 DIAGNOSIS — I2510 Atherosclerotic heart disease of native coronary artery without angina pectoris: Secondary | ICD-10-CM

## 2022-10-02 NOTE — Telephone Encounter (Signed)
Spoke with the pt and endorsed to her that we will keep the renal US scheduled for 12/5 as is, and if Dr. Fletcher Anon feels that this is not necessary at next weeks visit with him, then he can always cancel this test accordingly thereafter.   Pt verbalized understanding and agrees with this plan.

## 2022-10-02 NOTE — Patient Instructions (Addendum)
Medication Instructions:   Your physician recommends that you continue on your current medications as directed. Please refer to the Current Medication list given to you today.  *If you need a refill on your cardiac medications before your next appointment, please call your pharmacy*   Lab Work:  IN [place] YOU--CHECK LIPIDS--PLEASE MAKE SURE YOU GO FASTING TO THIS LAB APPOINTMENT  If you have labs (blood work) drawn today and your tests are completely normal, you will receive your results only by: [place] (if you have MyChart) OR A paper copy in the mail If you have any lab test that is abnormal or we need to change your treatment, we will call you to review the results.   Testing/Procedures:  Your physician has requested that you have a renal artery duplex. During this test, an ultrasound is used to evaluate blood flow to the kidneys. Allow one hour for this exam. Do not eat after midnight the day before and avoid carbonated beverages. Take your medications as you usually do.    Follow-Up: At [facility], you and your health needs are our priority.  As part of our continuing mission to provide you with exceptional heart care, we have created designated Provider Care Teams.  These Care Teams include your primary Cardiologist (physician) and Advanced Practice Providers (APPs -  Physician Assistants and Nurse Practitioners) who all work together to provide you with the care you need, when you need it.  We recommend signing up for the patient portal called "MyChart".  Sign up information is provided on this After Visit Summary.  MyChart is used to connect with patients for Virtual Visits (Telemedicine).  Patients are able to view lab/test results, encounter notes, upcoming appointments, etc.  Non-urgent messages can be sent to your provider as well.   To learn more about what you can do with MyChart, go to [email or web address].    Your next  appointment:   6 month(s)  The format for your next appointment:   In Person  Provider:   Freada Bergeron, MD     Important Information About Sugar

## 2022-10-02 NOTE — Progress Notes (Signed)
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  patient portal called "MyChart".  Sign up information is provided on this After Visit Summary.  MyChart is used to connect with patients for Virtual Visits (Telemedicine).  Patients are able to view lab/test results, encounter notes, upcoming appointments, etc.  Non-urgent messages can be sent to your provider as well.   To learn more about what you can do with MyChart, go to [email or web address].    Your next appointment:   6 month(s)  The format for your next appointment:   In Person  Provider:   Freada Bergeron, MD     Important Information About [place] as a scribe for Freada Bergeron, MD.,have documented all relevant  documentation on the behalf of Freada Bergeron, MD,as directed by  Freada Bergeron, MD while in the presence of Freada Bergeron, MD.   I, Freada Bergeron, MD, have reviewed all documentation for this visit. The documentation on 10/02/22 for the exam, diagnosis, procedures, and orders are all accurate and complete.   Signed, Freada Bergeron, MD  10/02/2022 2:06 PM    [place]

## 2022-10-02 NOTE — Telephone Encounter (Signed)
Patient called stating she has an appointment with Dr. Fletcher Anon next week and he may be ordering a CT test next week.  Patient stated her primary doctor does not recommend she do ultrasound.  Patient would like a call back to discuss these tests as she does not want to duplicate tests.

## 2022-10-10 ENCOUNTER — Ambulatory Visit: Payer: Medicare Other | Attending: Cardiovascular Disease | Admitting: Cardiovascular Disease

## 2022-10-10 ENCOUNTER — Encounter: Payer: Self-pay | Admitting: Cardiovascular Disease

## 2022-10-10 ENCOUNTER — Ambulatory Visit (INDEPENDENT_AMBULATORY_CARE_PROVIDER_SITE_OTHER): Payer: Medicare Other

## 2022-10-10 VITALS — BP 130/70 | HR 65 | Ht 63.0 in | Wt 154.8 lb

## 2022-10-10 DIAGNOSIS — I480 Paroxysmal atrial fibrillation: Secondary | ICD-10-CM | POA: Diagnosis not present

## 2022-10-10 DIAGNOSIS — Z5181 Encounter for therapeutic drug level monitoring: Secondary | ICD-10-CM

## 2022-10-10 DIAGNOSIS — I701 Atherosclerosis of renal artery: Secondary | ICD-10-CM | POA: Insufficient documentation

## 2022-10-10 DIAGNOSIS — I739 Peripheral vascular disease, unspecified: Secondary | ICD-10-CM | POA: Diagnosis not present

## 2022-10-10 DIAGNOSIS — I10 Essential (primary) hypertension: Secondary | ICD-10-CM | POA: Diagnosis not present

## 2022-10-10 DIAGNOSIS — E785 Hyperlipidemia, unspecified: Secondary | ICD-10-CM | POA: Diagnosis not present

## 2022-10-10 LAB — POCT INR: INR: 2.4 (ref 2.0–3.0)

## 2022-10-10 NOTE — Patient Instructions (Signed)
CONTINUE 1 TABLET DAILY, EXCEPT 0.5 TABLET ON WEDNESDAY. Recheck INR in 4 weeks. [facility] [phone];

## 2022-10-10 NOTE — Patient Instructions (Signed)
Medication Instructions:  No changes *If you need a refill on your cardiac medications before your next appointment, please call your pharmacy*   Lab Work: None ordered If you have labs (blood work) drawn today and your tests are completely normal, you will receive your results only by: [place] (if you have MyChart) OR A paper copy in the mail If you have any lab test that is abnormal or we need to change your treatment, we will call you to review the results.   Testing/Procedures: Your physician has requested that you have a renal artery duplex in January. During this test, an ultrasound is used to evaluate blood flow to the kidneys. Take your medications as you usually do. This will take place at [place], [address].  No food after 11PM the night before.  Water is OK. (Don't drink liquids if you have been instructed not to for ANOTHER test). Avoid foods that produce bowel gas, for 24 hours prior to exam (see below). No breakfast, no chewing gum, no smoking or carbonated beverages. Patient may take morning medications with water. Come in for test at least 15 minutes early to register.  Your physician has requested that you have an ankle brachial index (ABI) in January.   During this test an ultrasound and blood pressure cuff are used to evaluate the arteries that supply the arms and legs with blood. Allow thirty minutes for this exam. There are no restrictions or special instructions. This will take place at [place], [address].   Your physician has requested that you have an Aorta/Iliac Duplex in January.  This will be take place at [place], [address].  No food after 11PM the night before.  Water is OK. (Don't drink liquids if you have been instructed not to for ANOTHER test) Avoid foods that produce bowel gas, for 24 hours prior to exam (see below). No breakfast, no chewing gum, no smoking or carbonated beverages. Patient may take morning  medications with water. Come in for test at least 15 minutes early to register.    Follow-Up: At [facility], you and your health needs are our priority.  As part of our continuing mission to provide you with exceptional heart care, we have created designated Provider Care Teams.  These Care Teams include your primary Cardiologist (physician) and Advanced Practice Providers (APPs -  Physician Assistants and Nurse Practitioners) who all work together to provide you with the care you need, when you need it.  We recommend signing up for the patient portal called "MyChart".  Sign up information is provided on this After Visit Summary.  MyChart is used to connect with patients for Virtual Visits (Telemedicine).  Patients are able to view lab/test results, encounter notes, upcoming appointments, etc.  Non-urgent messages can be sent to your provider as well.   To learn more about what you can do with MyChart, go to [email or web address].    Your next appointment:   6 month(s)  The format for your next appointment:   In Person  Provider:   Dr. Fletcher Anon
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
## 2022-10-11 ENCOUNTER — Encounter: Payer: Self-pay | Admitting: Cardiology

## 2022-10-12 DIAGNOSIS — H02883 Meibomian gland dysfunction of right eye, unspecified eyelid: Secondary | ICD-10-CM | POA: Insufficient documentation

## 2022-10-12 DIAGNOSIS — H0100A Unspecified blepharitis right eye, upper and lower eyelids: Secondary | ICD-10-CM | POA: Diagnosis not present

## 2022-10-12 DIAGNOSIS — M3501 Sicca syndrome with keratoconjunctivitis: Secondary | ICD-10-CM | POA: Diagnosis not present

## 2022-10-12 DIAGNOSIS — H0100B Unspecified blepharitis left eye, upper and lower eyelids: Secondary | ICD-10-CM | POA: Diagnosis not present

## 2022-10-17 ENCOUNTER — Encounter (HOSPITAL_COMMUNITY): Payer: Medicare Other

## 2022-10-18 DIAGNOSIS — E785 Hyperlipidemia, unspecified: Secondary | ICD-10-CM | POA: Diagnosis not present

## 2022-10-18 DIAGNOSIS — E538 Deficiency of other specified B group vitamins: Secondary | ICD-10-CM | POA: Diagnosis not present

## 2022-10-18 DIAGNOSIS — M810 Age-related osteoporosis without current pathological fracture: Secondary | ICD-10-CM | POA: Diagnosis not present

## 2022-10-18 DIAGNOSIS — I10 Essential (primary) hypertension: Secondary | ICD-10-CM | POA: Diagnosis not present

## 2022-10-21 ENCOUNTER — Encounter: Payer: Self-pay | Admitting: Pulmonary Disease

## 2022-10-24 ENCOUNTER — Other Ambulatory Visit (HOSPITAL_COMMUNITY): Payer: Self-pay | Admitting: Internal Medicine

## 2022-10-24 ENCOUNTER — Telehealth: Payer: Self-pay | Admitting: Cardiology

## 2022-10-24 DIAGNOSIS — C9110 Chronic lymphocytic leukemia of B-cell type not having achieved remission: Secondary | ICD-10-CM | POA: Diagnosis not present

## 2022-10-24 MED ORDER — EZETIMIBE 10 MG PO TABS: 10.0000 mg | ORAL_TABLET | Freq: Every day | ORAL | 3 refills | Status: DC

## 2022-10-24 MED ORDER — LISINOPRIL 20 MG PO TABS: 20.0000 mg | ORAL_TABLET | Freq: Two times a day (BID) | ORAL | 3 refills | Status: DC

## 2022-10-24 NOTE — Telephone Encounter (Signed)
Pt's medications were sent to pt's pharmacy as requested. Confirmation received.  

## 2022-10-24 NOTE — Telephone Encounter (Signed)
*  STAT* If patient is at the pharmacy, call can be transferred to refill team.   1. Which medications need to be refilled? (please list name of each medication and dose if known)  ezetimibe (ZETIA) 10 MG tablet    lisinopril (ZESTRIL) 20 MG tablet    2. Which pharmacy/location (including street and city if local pharmacy) is medication to be sent to?  [facility] [place] [address]    3. Do they need a 30 day or 90 day supply? [place]

## 2022-10-31 ENCOUNTER — Ambulatory Visit (HOSPITAL_COMMUNITY): Payer: Medicare Other | Admitting: Nurse Practitioner

## 2022-11-03 ENCOUNTER — Ambulatory Visit (HOSPITAL_COMMUNITY)
Admission: RE | Admit: 2022-11-03 | Discharge: 2022-11-03 | Disposition: A | Discharge status: Home / Self Care | Payer: Medicare Other | Source: Ambulatory Visit | Attending: Nurse Practitioner | Admitting: Nurse Practitioner

## 2022-11-03 ENCOUNTER — Encounter (HOSPITAL_COMMUNITY): Payer: Self-pay | Admitting: Nurse Practitioner

## 2022-11-03 VITALS — BP 142/62 | HR 66 | Ht 63.0 in | Wt 154.0 lb

## 2022-11-03 DIAGNOSIS — I4819 Other persistent atrial fibrillation: Secondary | ICD-10-CM | POA: Diagnosis not present

## 2022-11-03 DIAGNOSIS — Z7901 Long term (current) use of anticoagulants: Secondary | ICD-10-CM | POA: Insufficient documentation

## 2022-11-03 DIAGNOSIS — I2510 Atherosclerotic heart disease of native coronary artery without angina pectoris: Secondary | ICD-10-CM | POA: Insufficient documentation

## 2022-11-03 DIAGNOSIS — Z951 Presence of aortocoronary bypass graft: Secondary | ICD-10-CM | POA: Insufficient documentation

## 2022-11-03 DIAGNOSIS — I10 Essential (primary) hypertension: Secondary | ICD-10-CM | POA: Insufficient documentation

## 2022-11-03 DIAGNOSIS — I4892 Unspecified atrial flutter: Secondary | ICD-10-CM | POA: Insufficient documentation

## 2022-11-03 DIAGNOSIS — J449 Chronic obstructive pulmonary disease, unspecified: Secondary | ICD-10-CM | POA: Insufficient documentation

## 2022-11-03 DIAGNOSIS — Z85118 Personal history of other malignant neoplasm of bronchus and lung: Secondary | ICD-10-CM | POA: Diagnosis not present

## 2022-11-03 DIAGNOSIS — F17200 Nicotine dependence, unspecified, uncomplicated: Secondary | ICD-10-CM | POA: Insufficient documentation

## 2022-11-03 DIAGNOSIS — I480 Paroxysmal atrial fibrillation: Secondary | ICD-10-CM | POA: Diagnosis not present

## 2022-11-03 DIAGNOSIS — D6869 Other thrombophilia: Secondary | ICD-10-CM
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
## 2022-11-08 ENCOUNTER — Telehealth: Payer: Self-pay | Admitting: Cardiology

## 2022-11-08 ENCOUNTER — Ambulatory Visit: Payer: Medicare Other | Attending: Cardiovascular Disease

## 2022-11-08 DIAGNOSIS — Z5181 Encounter for therapeutic drug level monitoring: Secondary | ICD-10-CM

## 2022-11-08 DIAGNOSIS — I480 Paroxysmal atrial fibrillation: Secondary | ICD-10-CM

## 2022-11-08 LAB — POCT INR: INR: 3.3 — AB (ref 2.0–3.0)

## 2022-11-08 NOTE — Patient Instructions (Signed)
HOLD TODAY ONLY AND THEN CONTINUE 1 TABLET DAILY, EXCEPT 0.5 TABLET ON WEDNESDAY. Recheck INR in 3 weeks. [facility] [phone];

## 2022-11-08 NOTE — Telephone Encounter (Signed)
Pt is calling to speak to Frederik Schmidt, RN today in regards to appt she had with him 12/27 at 1:30 P.M. in regards to trial medication she is wanting to start.

## 2022-11-10 NOTE — Telephone Encounter (Signed)
I spoke to patient and she will keep [place] informed on the [place] Trial.

## 2022-11-16 ENCOUNTER — Ambulatory Visit (HOSPITAL_COMMUNITY)
Admission: RE | Admit: 2022-11-16 | Discharge: 2022-11-16 | Disposition: A | Discharge status: Home / Self Care | Payer: Medicare Other | Source: Ambulatory Visit | Attending: Cardiovascular Disease | Admitting: Cardiovascular Disease

## 2022-11-16 DIAGNOSIS — I739 Peripheral vascular disease, unspecified: Secondary | ICD-10-CM

## 2022-11-21 ENCOUNTER — Encounter: Payer: Self-pay | Admitting: Cardiovascular Disease

## 2022-11-21 ENCOUNTER — Ambulatory Visit: Payer: Medicare Other | Admitting: Pulmonary Disease

## 2022-11-21 ENCOUNTER — Other Ambulatory Visit: Payer: Self-pay | Admitting: Internal Medicine

## 2022-11-21 DIAGNOSIS — I480 Paroxysmal atrial fibrillation: Secondary | ICD-10-CM

## 2022-11-21 NOTE — Telephone Encounter (Signed)
Prescription refill request received for warfarin Lov: 11/03/22 [name])  Next INR check: 11/27/22 Warfarin tablet strength: 2.5mg    Appropriate dose and refill sent to requested pharmacy.

## 2022-11-23 ENCOUNTER — Encounter: Payer: Self-pay | Admitting: Pulmonary Disease

## 2022-11-23 ENCOUNTER — Encounter: Payer: Self-pay | Admitting: *Deleted

## 2022-11-23 ENCOUNTER — Ambulatory Visit (INDEPENDENT_AMBULATORY_CARE_PROVIDER_SITE_OTHER): Payer: Medicare Other | Admitting: Pulmonary Disease

## 2022-11-23 VITALS — BP 126/68 | HR 70 | Ht 63.0 in | Wt 154.8 lb

## 2022-11-23 DIAGNOSIS — J432 Centrilobular emphysema: Secondary | ICD-10-CM
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
## 2022-11-23 NOTE — Patient Instructions (Signed)
No changes to medicine  We will plan to get a repeat echocardiogram in November  Return to clinic in 6 months or sooner as needed

## 2022-11-27 ENCOUNTER — Ambulatory Visit (INDEPENDENT_AMBULATORY_CARE_PROVIDER_SITE_OTHER): Payer: Medicare Other

## 2022-11-27 ENCOUNTER — Ambulatory Visit (HOSPITAL_BASED_OUTPATIENT_CLINIC_OR_DEPARTMENT_OTHER)
Admission: RE | Admit: 2022-11-27 | Discharge: 2022-11-27 | Disposition: A | Discharge status: Home / Self Care | Payer: Medicare Other | Source: Ambulatory Visit | Attending: Cardiovascular Disease | Admitting: Cardiovascular Disease

## 2022-11-27 ENCOUNTER — Ambulatory Visit (HOSPITAL_COMMUNITY)
Admission: RE | Admit: 2022-11-27 | Discharge: 2022-11-27 | Disposition: A | Discharge status: Home / Self Care | Payer: Medicare Other | Source: Ambulatory Visit | Attending: Cardiology | Admitting: Cardiology

## 2022-11-27 ENCOUNTER — Other Ambulatory Visit: Payer: Self-pay | Admitting: Cardiovascular Disease

## 2022-11-27 DIAGNOSIS — I739 Peripheral vascular disease, unspecified: Secondary | ICD-10-CM | POA: Diagnosis not present

## 2022-11-27 DIAGNOSIS — I701 Atherosclerosis of renal artery: Secondary | ICD-10-CM

## 2022-11-27 DIAGNOSIS — I480 Paroxysmal atrial fibrillation: Secondary | ICD-10-CM | POA: Diagnosis not present

## 2022-11-27 DIAGNOSIS — I10 Essential (primary) hypertension: Secondary | ICD-10-CM

## 2022-11-27 DIAGNOSIS — E785 Hyperlipidemia, unspecified: Secondary | ICD-10-CM

## 2022-11-27 DIAGNOSIS — Z5181 Encounter for therapeutic drug level monitoring: Secondary | ICD-10-CM

## 2022-11-27 DIAGNOSIS — Z95820 Peripheral vascular angioplasty status with implants and grafts: Secondary | ICD-10-CM | POA: Diagnosis not present

## 2022-11-27 LAB — VAS US ABI WITH/WO TBI
Left ABI: 0.95
Right ABI: 1.16

## 2022-11-27 LAB — POCT INR: INR: 1.8 — AB (ref 2.0–3.0)

## 2022-11-27 NOTE — Patient Instructions (Signed)
TAKE 1.5 TABLETS TODAY ONLY THEN CONTINUE TO 1 TABLET DAILY, EXCEPT 0.5 TABLET ON WEDNESDAY. Recheck INR in 4 weeks. [facility] [phone];

## 2022-11-30 ENCOUNTER — Other Ambulatory Visit: Payer: Self-pay | Admitting: *Deleted

## 2022-11-30 ENCOUNTER — Encounter: Payer: Self-pay | Admitting: Pulmonary Disease

## 2022-11-30 DIAGNOSIS — I739 Peripheral vascular disease, unspecified: Secondary | ICD-10-CM

## 2022-11-30 NOTE — Telephone Encounter (Signed)
Dr. Judeth Horn please advise on pt's message regarding her PFT. Thanks.

## 2022-12-12 ENCOUNTER — Other Ambulatory Visit: Payer: Self-pay | Admitting: Surgery

## 2022-12-12 DIAGNOSIS — C3492 Malignant neoplasm of unspecified part of left bronchus or lung: Secondary | ICD-10-CM

## 2022-12-13 DIAGNOSIS — Z1231 Encounter for screening mammogram for malignant neoplasm of breast: Secondary | ICD-10-CM | POA: Diagnosis not present

## 2022-12-21 ENCOUNTER — Encounter (HOSPITAL_COMMUNITY): Payer: Self-pay | Admitting: *Deleted

## 2022-12-25 DIAGNOSIS — D225 Melanocytic nevi of trunk: Secondary | ICD-10-CM | POA: Diagnosis not present

## 2022-12-25 DIAGNOSIS — L298 Other pruritus: Secondary | ICD-10-CM | POA: Diagnosis not present

## 2022-12-25 DIAGNOSIS — L538 Other specified erythematous conditions: Secondary | ICD-10-CM | POA: Diagnosis not present

## 2022-12-25 DIAGNOSIS — D485 Neoplasm of uncertain behavior of skin: Secondary | ICD-10-CM | POA: Diagnosis not present

## 2022-12-25 DIAGNOSIS — D2262 Melanocytic nevi of left upper limb, including shoulder: Secondary | ICD-10-CM | POA: Diagnosis not present

## 2022-12-25 DIAGNOSIS — Z85828 Personal history of other malignant neoplasm of skin: Secondary | ICD-10-CM | POA: Diagnosis not present

## 2022-12-25 DIAGNOSIS — L570 Actinic keratosis: Secondary | ICD-10-CM | POA: Diagnosis not present

## 2022-12-25 DIAGNOSIS — L820 Inflamed seborrheic keratosis: Secondary | ICD-10-CM | POA: Diagnosis not present

## 2022-12-25 DIAGNOSIS — D0461 Carcinoma in situ of skin of right upper limb, including shoulder: Secondary | ICD-10-CM | POA: Diagnosis not present

## 2022-12-25 DIAGNOSIS — D2261 Melanocytic nevi of right upper limb, including shoulder: Secondary | ICD-10-CM | POA: Diagnosis not present

## 2022-12-28 ENCOUNTER — Ambulatory Visit: Payer: Medicare Other | Attending: Cardiology | Admitting: *Deleted

## 2022-12-28 DIAGNOSIS — I480 Paroxysmal atrial fibrillation: Secondary | ICD-10-CM | POA: Diagnosis not present

## 2022-12-28 DIAGNOSIS — Z5181 Encounter for therapeutic drug level monitoring: Secondary | ICD-10-CM | POA: Diagnosis not present

## 2022-12-28 LAB — POCT INR: INR: 2.5 (ref 2.0–3.0)

## 2022-12-28 NOTE — Patient Instructions (Signed)
Description   CONTINUE TAKING WARFARIN 1 TABLET DAILY, EXCEPT 0.5 TABLET ON WEDNESDAY. Recheck INR in 4 weeks. [facility] [phone];

## 2023-01-02 ENCOUNTER — Ambulatory Visit: Payer: Medicare Other | Attending: Cardiovascular Disease | Admitting: Cardiovascular Disease

## 2023-01-02 VITALS — BP 152/70 | HR 62 | Ht 63.0 in | Wt 153.8 lb

## 2023-01-02 DIAGNOSIS — I701 Atherosclerosis of renal artery: Secondary | ICD-10-CM | POA: Diagnosis not present

## 2023-01-02 DIAGNOSIS — E785 Hyperlipidemia, unspecified: Secondary | ICD-10-CM | POA: Diagnosis not present

## 2023-01-02 DIAGNOSIS — I480 Paroxysmal atrial fibrillation: Secondary | ICD-10-CM | POA: Diagnosis not present

## 2023-01-02 DIAGNOSIS — I10 Essential (primary) hypertension: Secondary | ICD-10-CM | POA: Insufficient documentation

## 2023-01-02 DIAGNOSIS — I739 Peripheral vascular disease, unspecified: Secondary | ICD-10-CM | POA: Diagnosis not present

## 2023-01-02 NOTE — Patient Instructions (Signed)
Medication Instructions:  No changes *If you need a refill on your cardiac medications before your next appointment, please call your pharmacy*   Lab Work: Your provider would like for you to have the following labs today: LPa  If you have labs (blood work) drawn today and your tests are completely normal, you will receive your results only by: [employer] (if you have MyChart) OR A paper copy in the mail If you have any lab test that is abnormal or we need to change your treatment, we will call you to review the results.   Testing/Procedures: None ordered   Follow-Up: At [facility], you and your health needs are our priority.  As part of our continuing mission to provide you with exceptional heart care, we have created designated Provider Care Teams.  These Care Teams include your primary Cardiologist (physician) and Advanced Practice Providers (APPs -  Physician Assistants and Nurse Practitioners) who all work together to provide you with the care you need, when you need it.  We recommend signing up for the patient portal called "MyChart".  Sign up information is provided on this After Visit Summary.  MyChart is used to connect with patients for Virtual Visits (Telemedicine).  Patients are able to view lab/test results, encounter notes, upcoming appointments, etc.  Non-urgent messages can be sent to your provider as well.   To learn more about what you can do with MyChart, go to [email or web address].    Your next appointment:   6 month(s)  Provider:   Dr. Fletcher Anon
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
## 2023-01-03 DIAGNOSIS — D0461 Carcinoma in situ of skin of right upper limb, including shoulder: Secondary | ICD-10-CM | POA: Diagnosis not present

## 2023-01-04 ENCOUNTER — Telehealth: Payer: Self-pay | Admitting: Cardiovascular Disease

## 2023-01-04 LAB — LIPOPROTEIN A (LPA): Lipoprotein (a): 234.6 nmol/L — ABNORMAL HIGH (ref <75.0)

## 2023-01-04 NOTE — Telephone Encounter (Signed)
Patient made aware of results and verbalized understanding.  Results: Her LP(a) is elevated.  I sent a message to the research office to see if she qualifies for any study but none are available at this time and we can let him know if we start enrolling.

## 2023-01-04 NOTE — Telephone Encounter (Signed)
Pt is calling in regards to lab results. Requesting return call.

## 2023-01-24 ENCOUNTER — Ambulatory Visit: Payer: Medicare Other | Attending: Cardiovascular Disease

## 2023-01-24 DIAGNOSIS — Z5181 Encounter for therapeutic drug level monitoring: Secondary | ICD-10-CM

## 2023-01-24 DIAGNOSIS — I480 Paroxysmal atrial fibrillation: Secondary | ICD-10-CM

## 2023-01-24 LAB — POCT INR: INR: 2.5 (ref 2.0–3.0)

## 2023-01-24 NOTE — Patient Instructions (Signed)
Description   CONTINUE TAKING WARFARIN 1 TABLET DAILY, EXCEPT 0.5 TABLET ON WEDNESDAYS. Recheck INR in 4 weeks. [facility] [phone];

## 2023-02-07 ENCOUNTER — Ambulatory Visit (INDEPENDENT_AMBULATORY_CARE_PROVIDER_SITE_OTHER): Payer: Medicare Other | Admitting: Surgery

## 2023-02-07 ENCOUNTER — Ambulatory Visit
Admission: RE | Admit: 2023-02-07 | Discharge: 2023-02-07 | Disposition: A | Discharge status: Home / Self Care | Payer: Medicare Other | Source: Ambulatory Visit | Attending: Surgery | Admitting: Surgery

## 2023-02-07 ENCOUNTER — Encounter (INDEPENDENT_AMBULATORY_CARE_PROVIDER_SITE_OTHER): Payer: Self-pay

## 2023-02-07 ENCOUNTER — Encounter: Payer: Self-pay | Admitting: Surgery

## 2023-02-07 VITALS — BP 163/75 | HR 64 | Resp 20 | Ht 63.0 in | Wt 153.0 lb

## 2023-02-07 DIAGNOSIS — Z85118 Personal history of other malignant neoplasm of bronchus and lung: Secondary | ICD-10-CM | POA: Diagnosis not present

## 2023-02-07 DIAGNOSIS — C3490 Malignant neoplasm of unspecified part of unspecified bronchus or lung: Secondary | ICD-10-CM | POA: Diagnosis not present

## 2023-02-07 DIAGNOSIS — R918 Other nonspecific abnormal finding of lung field: Secondary | ICD-10-CM | POA: Diagnosis not present

## 2023-02-07 DIAGNOSIS — J439 Emphysema, unspecified: Secondary | ICD-10-CM | POA: Diagnosis not present

## 2023-02-07 DIAGNOSIS — J90 Pleural effusion, not elsewhere classified: Secondary | ICD-10-CM | POA: Diagnosis not present

## 2023-02-07 DIAGNOSIS — C3492 Malignant neoplasm of unspecified part of left bronchus or lung: Secondary | ICD-10-CM

## 2023-02-07 DIAGNOSIS — R911 Solitary pulmonary nodule: Secondary | ICD-10-CM | POA: Diagnosis not present

## 2023-02-07 DIAGNOSIS — I700 Atherosclerosis of aorta: Secondary | ICD-10-CM | POA: Diagnosis not present

## 2023-02-07 NOTE — Progress Notes (Signed)
HPI:  The patient is a 77 year old woman who underwent left muscle-sparing thoracotomy and left lower lobectomy on 07/02/2017 for a T1, N0, N0 squamous cell carcinoma of the lung.  She had lymphovascular invasion but negative resection margins. She has CLL but it is early stage and does not require any treatment. When I saw her in February 2021 a CT of the chest showed some enlargement of the left axillary lymph nodes with the most prominent one measuring 1.3 cm.  We decided to do a PET scan to evaluate that further and it showed mild metabolic activity associated with the left axillary lymphadenopathy but less than the background blood pool activity.  The previously seen 12 mm lymph node had decreased in size to 10 mm.  It was felt that most likely this lymphadenopathy was related to her recent Moderna COVID-19 vaccine given in the left arm. She was last seen by one of our PA's in March 2023 and was doing well with no sign of recurrent or metastatic lung cancer.  She underwent an ablation for paroxysmal atrial fibrillation by Dr. Curt Bears on 02/09/2022.  She continues to feel well overall.  She has some chronic shortness of breath and is followed by Dr. Silas Flood from pulmonary medicine for pulmonary hypertension.  Current Outpatient Medications  Medication Sig Dispense Refill   acetaminophen (TYLENOL) 500 MG tablet Take 1,000 mg by mouth every 8 (eight) hours as needed (pain.).      albuterol (VENTOLIN HFA) 108 (90 Base) MCG/ACT inhaler Inhale 2 puffs into the lungs every 6 (six) hours as needed for wheezing or shortness of breath. 8 g 5   Ascorbic Acid (VITAMIN C PO) Take 1 tablet by mouth daily as needed (immune support).     Carboxymethylcellulose Sodium (THERATEARS OP) Apply 3 drops to eye in the morning, at noon, in the evening, and at bedtime.     cholecalciferol (VITAMIN D3) 25 MCG (1000 UNIT) tablet Take 1,000 Units by mouth daily.     co-enzyme Q-10 30 MG capsule Take 30 mg by mouth daily.      docusate sodium (COLACE) 100 MG capsule Take 200 mg by mouth daily. Take 1 tablet daily     ezetimibe (ZETIA) 10 MG tablet Take 1 tablet (10 mg total) by mouth daily. 90 tablet 3   hydrocortisone valerate cream (WESTCORT) 0.2 % Apply 1 application topically as needed (for eczema).     isosorbide mononitrate (IMDUR) 30 MG 24 hr tablet Take 1 tablet by mouth once daily 90 tablet 3   lisinopril (ZESTRIL) 20 MG tablet Take 1 tablet (20 mg total) by mouth in the morning and at bedtime. 180 tablet 3   metoprolol succinate (TOPROL-XL) 25 MG 24 hr tablet Take 1 tablet (25 mg total) by mouth daily. 90 tablet 2   metoprolol tartrate (LOPRESSOR) 25 MG tablet Take 25 mg by mouth as needed.     nitroGLYCERIN (NITROSTAT) 0.4 MG SL tablet Place 1 tablet (0.4 mg total) under the tongue every 5 (five) minutes x 3 doses as needed for chest pain. 25 tablet 2   pyridOXINE (VITAMIN B-6) 100 MG tablet Take 100 mg by mouth daily at 12 noon.     rosuvastatin (CRESTOR) 20 MG tablet Take 1 tablet (20 mg total) by mouth daily. 180 tablet 3   umeclidinium-vilanterol (ANORO ELLIPTA) 62.5-25 MCG/ACT AEPB Inhale 1 puff into the lungs daily. 60 each 5   valACYclovir (VALTREX) 500 MG tablet Take 500 mg by mouth daily.  vitamin B-12 (CYANOCOBALAMIN) 1000 MCG tablet Take 1,000 mcg by mouth daily at 12 noon.     warfarin (COUMADIN) 2.5 MG tablet TAKE 1/2 TABLET TO 1 TABLET BY MOUTH ONCE DAILY OR AS PRESCRIBED BY CLINIC 90 tablet 2   No current facility-administered medications for this visit.     Physical Exam: BP (!) 163/75   Pulse 64   Resp 20   Ht 5\' 3"  (1.6 m)   Wt 153 lb (69.4 kg)   SpO2 95% Comment: RA  BMI 27.10 kg/m  She looks well. Cardiac exam shows regular rate and rhythm with normal heart sounds.  There is no murmur. Lungs are clear. There is no peripheral edema.  Diagnostic Tests:  Narrative & Impression  CLINICAL DATA:  Small cell lung cancer; * Tracking Code: BO *   EXAM: CT CHEST WITHOUT  CONTRAST   TECHNIQUE: Multidetector CT imaging of the chest was performed following the standard protocol without IV contrast.   RADIATION DOSE REDUCTION: This exam was performed according to the departmental dose-optimization program which includes automated exposure control, adjustment of the mA and/or kV according to patient size and/or use of iterative reconstruction technique.   COMPARISON:  Multiple priors, most recent chest CT dated August 09, 2022   FINDINGS: Cardiovascular: Normal heart size. No pericardial effusion. Normal caliber thoracic aorta with severe calcified plaque. Severe left main and three-vessel coronary artery calcifications status post CABG.   Mediastinum/Nodes: Small hiatal hernia. Thyroid is unremarkable. No enlarged lymph nodes seen in the chest.   Lungs/Pleura: Central airways are patent. Prior left lower lobectomy. Moderate centrilobular emphysema. Stable small scattered pulmonary nodules. Reference right upper lobe nodules measuring 3 mm on series 5, image 34 and 3 mm on image 43. Stable small left pleural effusion.   Upper Abdomen: No acute abnormality.   Musculoskeletal: Prior median sternotomy. Stable mild-to-moderate compression deformities of the thoracic spine. No aggressive appearing osseous lesions.   IMPRESSION: 1. Prior left lower lobectomy with no evidence of recurrent or metastatic disease. 2. Stable small left pleural effusion. 3. Stable small solid right upper lobe pulmonary nodules. 4. Aortic Atherosclerosis (ICD10-I70.0) and Emphysema (ICD10-J43.9).     Electronically Signed   By: Yetta Glassman M.D.   On: 02/07/2023 11:22      Impression:  She is doing well 5-1/2 years following lung cancer resection without evidence of recurrent or metastatic disease.  There is a stable small pleural effusion on the left side which has been present for years and unchanged.  There are stable small solid right upper lobe pulmonary  nodules measuring about 3 mm.  I reviewed the CT images with her and answered her questions.  Plan:  I will plan to see her back in 1 year with a CT scan of the chest without contrast for lung cancer surveillance.  I spent 15 minutes performing this established patient evaluation and > 50% of this time was spent face to face counseling and coordinating the surveillance of her previously resected lung cancer.   Gaye Pollack, MD [facility] [phone]

## 2023-02-12 ENCOUNTER — Encounter: Payer: Self-pay | Admitting: Surgery

## 2023-02-21 ENCOUNTER — Ambulatory Visit: Payer: Medicare Other | Attending: Cardiovascular Disease

## 2023-02-21 DIAGNOSIS — Z5181 Encounter for therapeutic drug level monitoring: Secondary | ICD-10-CM

## 2023-02-21 DIAGNOSIS — I480 Paroxysmal atrial fibrillation: Secondary | ICD-10-CM

## 2023-02-21 LAB — POCT INR: INR: 2.2 (ref 2.0–3.0)

## 2023-02-21 NOTE — Patient Instructions (Signed)
Description   CONTINUE TAKING WARFARIN 1 TABLET DAILY, EXCEPT 0.5 TABLET ON WEDNESDAYS. Recheck INR in 5 weeks. Coumadin Clinic [phone];

## 2023-02-27 DIAGNOSIS — C9110 Chronic lymphocytic leukemia of B-cell type not having achieved remission: Secondary | ICD-10-CM | POA: Diagnosis not present

## 2023-03-06 ENCOUNTER — Other Ambulatory Visit: Payer: Self-pay | Admitting: Pulmonary Disease

## 2023-03-10 ENCOUNTER — Ambulatory Visit (HOSPITAL_COMMUNITY)
Admission: EM | Admit: 2023-03-10 | Discharge: 2023-03-10 | Disposition: A | Discharge status: Home / Self Care | Payer: Medicare Other | Attending: Internal Medicine | Admitting: Internal Medicine

## 2023-03-10 ENCOUNTER — Encounter (HOSPITAL_COMMUNITY): Payer: Self-pay

## 2023-03-10 DIAGNOSIS — L089 Local infection of the skin and subcutaneous tissue, unspecified: Secondary | ICD-10-CM

## 2023-03-10 DIAGNOSIS — S90424A Blister (nonthermal), right lesser toe(s), initial encounter: Secondary | ICD-10-CM

## 2023-03-10 DIAGNOSIS — D849 Immunodeficiency, unspecified: Secondary | ICD-10-CM | POA: Diagnosis not present

## 2023-03-10 MED ORDER — MUPIROCIN 2 % EX OINT: 1.0000 | TOPICAL_OINTMENT | Freq: Two times a day (BID) | CUTANEOUS | 0 refills | Status: DC

## 2023-03-10 MED ORDER — AMOXICILLIN-POT CLAVULANATE 875-125 MG PO TABS: 1.0000 | ORAL_TABLET | Freq: Two times a day (BID) | ORAL | 0 refills | Status: DC

## 2023-03-10 NOTE — Discharge Instructions (Signed)
Apply mupirocin ointment to your wound on your toe twice a day for the next 7 days. Gently cleanse the wound with soap and water, do not scrub wound.  Take Augmentin antibiotic twice daily for 7 days with food.  If you start noticing any new or worsening signs of infection such as redness, swelling, white/yellow drainage, or severe pain, please return to urgent care. If your symptoms are severe, please go to the ER. Schedule an appointment with your primary care provider in the next 1-2 weeks for re-check to make sure this is getting better.  I hope you feel better!

## 2023-03-10 NOTE — ED Triage Notes (Signed)
Blister on the right big toe. Onset Monday night. Patient states she thinks this is infected now. Started with a Sunburn on Sunday that blistered Monday night. Patient has chronic lymphocytic leukemia stage 0.
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
## 2023-03-12 DIAGNOSIS — E871 Hypo-osmolality and hyponatremia: Secondary | ICD-10-CM | POA: Diagnosis not present

## 2023-03-13 DIAGNOSIS — I25709 Atherosclerosis of coronary artery bypass graft(s), unspecified, with unspecified angina pectoris: Secondary | ICD-10-CM | POA: Diagnosis not present

## 2023-03-13 DIAGNOSIS — I2729 Other secondary pulmonary hypertension: Secondary | ICD-10-CM | POA: Diagnosis not present

## 2023-03-13 DIAGNOSIS — Z933 Colostomy status: Secondary | ICD-10-CM | POA: Diagnosis not present

## 2023-03-13 DIAGNOSIS — J432 Centrilobular emphysema: Secondary | ICD-10-CM | POA: Diagnosis not present

## 2023-03-13 DIAGNOSIS — I480 Paroxysmal atrial fibrillation: Secondary | ICD-10-CM | POA: Diagnosis not present

## 2023-03-13 DIAGNOSIS — C9110 Chronic lymphocytic leukemia of B-cell type not having achieved remission: Secondary | ICD-10-CM | POA: Diagnosis not present

## 2023-03-13 DIAGNOSIS — I10 Essential (primary) hypertension: Secondary | ICD-10-CM | POA: Diagnosis not present

## 2023-03-28 ENCOUNTER — Ambulatory Visit: Payer: Medicare Other | Attending: Cardiovascular Disease

## 2023-03-28 DIAGNOSIS — I480 Paroxysmal atrial fibrillation: Secondary | ICD-10-CM | POA: Diagnosis not present

## 2023-03-28 DIAGNOSIS — Z5181 Encounter for therapeutic drug level monitoring: Secondary | ICD-10-CM | POA: Diagnosis not present

## 2023-03-28 LAB — POCT INR: INR: 3.2 — AB (ref 2.0–3.0)

## 2023-03-28 NOTE — Patient Instructions (Signed)
HOLD TODAY ONLY THEN CONTINUE TAKING WARFARIN 1 TABLET DAILY, EXCEPT 0.5 TABLET ON WEDNESDAYS. Recheck INR in 3 weeks. [facility]mad[facility] [phone];

## 2023-04-03 ENCOUNTER — Ambulatory Visit (INDEPENDENT_AMBULATORY_CARE_PROVIDER_SITE_OTHER): Payer: Medicare Other | Admitting: Pulmonary Disease

## 2023-04-03 ENCOUNTER — Encounter: Payer: Self-pay | Admitting: Pulmonary Disease

## 2023-04-03 VITALS — BP 110/58 | HR 72 | Temp 97.6°F | Ht 63.0 in | Wt 158.2 lb

## 2023-04-03 DIAGNOSIS — R0609 Other forms of dyspnea: Secondary | ICD-10-CM

## 2023-04-03 MED ORDER — SEREVENT DISKUS 50 MCG/ACT IN AEPB
1.0000 | INHALATION_SPRAY | Freq: Two times a day (BID) | RESPIRATORY_TRACT | 11 refills | Status: DC
Start: 2023-04-03 — End: 2023-05-10

## 2023-04-03 NOTE — Patient Instructions (Addendum)
Nice to see you   Stop Anoro  Use Serovent 1 puff twice a day  Continue albuterol 2 puff as needed or before exertion  We will repeat a heart ultrasound or echocardiogram  Return to clinic in 6 months or sooner as needed
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
## 2023-04-05 ENCOUNTER — Ambulatory Visit (HOSPITAL_COMMUNITY): Payer: Medicare Other | Attending: Cardiology

## 2023-04-05 DIAGNOSIS — R0609 Other forms of dyspnea: Secondary | ICD-10-CM | POA: Diagnosis not present

## 2023-04-05 LAB — ECHOCARDIOGRAM COMPLETE
Area-P 1/2: 4.58 cm2
S' Lateral: 2.68 cm

## 2023-04-05 NOTE — Progress Notes (Signed)
Echocardiogram overall compared results over the last several years is unchanged.  The estimated pressure in the pulmonary arteries is low but higher than it was at last check, 6 months ago, but overall unchanged over the last several years.

## 2023-04-06 DIAGNOSIS — I10 Essential (primary) hypertension: Secondary | ICD-10-CM | POA: Diagnosis not present

## 2023-04-06 DIAGNOSIS — D6869 Other thrombophilia: Secondary | ICD-10-CM | POA: Diagnosis not present

## 2023-04-06 DIAGNOSIS — I701 Atherosclerosis of renal artery: Secondary | ICD-10-CM | POA: Diagnosis not present

## 2023-04-06 DIAGNOSIS — I2510 Atherosclerotic heart disease of native coronary artery without angina pectoris: Secondary | ICD-10-CM | POA: Diagnosis not present

## 2023-04-06 DIAGNOSIS — I771 Stricture of artery: Secondary | ICD-10-CM | POA: Diagnosis not present

## 2023-04-06 DIAGNOSIS — E785 Hyperlipidemia, unspecified: Secondary | ICD-10-CM | POA: Diagnosis not present

## 2023-04-06 DIAGNOSIS — I480 Paroxysmal atrial fibrillation: Secondary | ICD-10-CM | POA: Diagnosis not present

## 2023-04-06 DIAGNOSIS — I739 Peripheral vascular disease, unspecified: Secondary | ICD-10-CM | POA: Diagnosis not present

## 2023-04-06 LAB — LIPID PANEL
Chol/HDL Ratio: 2.3 ratio (ref 0.0–4.4)
Cholesterol, Total: 162 mg/dL (ref 100–199)
HDL: 71 mg/dL (ref >39)
LDL Chol Calc (NIH): 78 mg/dL (ref 0–99)
Triglycerides: 64 mg/dL (ref 0–149)
VLDL Cholesterol Cal: 13 mg/dL (ref 5–40)

## 2023-04-10 ENCOUNTER — Ambulatory Visit: Payer: Medicare Other | Admitting: Cardiovascular Disease
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
## 2023-04-13 ENCOUNTER — Ambulatory Visit: Payer: Medicare Other | Attending: Cardiology | Admitting: Cardiology

## 2023-04-13 ENCOUNTER — Encounter: Payer: Self-pay | Admitting: Cardiology

## 2023-04-13 VITALS — BP 138/64 | HR 71 | Ht 63.0 in | Wt 154.8 lb

## 2023-04-13 DIAGNOSIS — Z789 Other specified health status: Secondary | ICD-10-CM

## 2023-04-13 DIAGNOSIS — I2510 Atherosclerotic heart disease of native coronary artery without angina pectoris: Secondary | ICD-10-CM | POA: Diagnosis not present

## 2023-04-13 DIAGNOSIS — I10 Essential (primary) hypertension: Secondary | ICD-10-CM | POA: Diagnosis not present

## 2023-04-13 DIAGNOSIS — Z79899 Other long term (current) drug therapy: Secondary | ICD-10-CM | POA: Diagnosis not present

## 2023-04-13 DIAGNOSIS — I739 Peripheral vascular disease, unspecified: Secondary | ICD-10-CM | POA: Diagnosis not present

## 2023-04-13 DIAGNOSIS — D6869 Other thrombophilia: Secondary | ICD-10-CM

## 2023-04-13 DIAGNOSIS — I701 Atherosclerosis of renal artery: Secondary | ICD-10-CM | POA: Diagnosis not present

## 2023-04-13 DIAGNOSIS — E785 Hyperlipidemia, unspecified: Secondary | ICD-10-CM | POA: Diagnosis not present

## 2023-04-13 DIAGNOSIS — I4819 Other persistent atrial fibrillation: Secondary | ICD-10-CM | POA: Diagnosis not present

## 2023-04-13 DIAGNOSIS — I480 Paroxysmal atrial fibrillation: Secondary | ICD-10-CM

## 2023-04-13 DIAGNOSIS — R0609 Other forms of dyspnea: Secondary | ICD-10-CM

## 2023-04-13 NOTE — Patient Instructions (Signed)
Medication Instructions:   Your physician recommends that you continue on your current medications as directed. Please refer to the Current Medication list given to you today.  *If you need a refill on your cardiac medications before your next appointment, please call your pharmacy*   You have been referred to [facility] TO SEE THE PHARMACIST FOR MANAGEMENT OF LIPIDS AND STATIN INTOLERANCE    Follow-Up:  AS SCHEDULED WITH DR. Kirke Corin

## 2023-04-16 ENCOUNTER — Encounter: Payer: Self-pay | Admitting: Cardiology

## 2023-04-16 ENCOUNTER — Other Ambulatory Visit: Payer: Self-pay | Admitting: Internal Medicine

## 2023-04-16 ENCOUNTER — Encounter: Payer: Self-pay | Admitting: Pulmonary Disease

## 2023-04-16 NOTE — Telephone Encounter (Signed)
Dr. Judeth Horn, please advise on pt email:   Dr. Judeth Horn, I think I remember you recommended that I do the Ventolin in the mornings before the Serevent.  As I said, I usually do the Ventolin 10-15 minutes before going to the track to walk.  So are you recommending that I inhale the Serevent sometime after the Ventolin but before I walk or inhale the Serevent sometime after I walk.   My breathing is not as good so far on the Serevent as it was on Anoro Ellipta. However, I have noticed a marked increase in energy.  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  Since inhaling just the Servent, I am not procrastinating and my stamina has improved, interestingly enough, in spite of the worse breathing problems during exertion.  Also, my BP is lower.   If you have any additional recommendations, I will appreciate them - just trying to fine tune a few things.  After my visit with Dr.Pemberton, she has ordered lab work to determine if a new injection might help lower cholesterol and maybe get rid of the bad calf pain that I'm having again when I walk. Thank you, [name]

## 2023-04-18 ENCOUNTER — Ambulatory Visit: Payer: Medicare Other | Attending: Cardiovascular Disease

## 2023-04-18 DIAGNOSIS — I480 Paroxysmal atrial fibrillation: Secondary | ICD-10-CM | POA: Diagnosis not present

## 2023-04-18 DIAGNOSIS — Z5181 Encounter for therapeutic drug level monitoring: Secondary | ICD-10-CM | POA: Diagnosis not present

## 2023-04-18 LAB — POCT INR: INR: 2.9 (ref 2.0–3.0)

## 2023-04-18 NOTE — Patient Instructions (Signed)
HOLD TODAY ONLY THEN CONTINUE TAKING WARFARIN 1 TABLET DAILY, EXCEPT 0.5 TABLET ON WEDNESDAYS. Recheck INR in 3 weeks. [facility] [phone];   

## 2023-04-20 NOTE — Telephone Encounter (Signed)
Pt is scheduled to see Dr Elberta Fortis 05/30/2023 at 10am.

## 2023-04-23 NOTE — Telephone Encounter (Signed)
Message sent you a update -   I have only used the Anoro again the last two nights and I didn't walk or do anything strenuous yesterday. I will walk a mile or so today. I would think it'll make a big difference after a few days. I didn't have complaints about my breathing with Anoro; it was some of the side effects I was concerned about. I'll let you know after some exercise. Thank you Dr. Judeth Horn.

## 2023-05-09 ENCOUNTER — Ambulatory Visit: Payer: Medicare Other | Attending: Cardiovascular Disease

## 2023-05-09 DIAGNOSIS — I480 Paroxysmal atrial fibrillation: Secondary | ICD-10-CM | POA: Diagnosis not present

## 2023-05-09 DIAGNOSIS — Z5181 Encounter for therapeutic drug level monitoring: Secondary | ICD-10-CM | POA: Insufficient documentation

## 2023-05-09 LAB — POCT INR: INR: 2.3 (ref 2.0–3.0)

## 2023-05-09 NOTE — Patient Instructions (Signed)
CONTINUE TAKING WARFARIN 1 TABLET DAILY, EXCEPT 0.5 TABLET ON WEDNESDAYS. Recheck INR in 4 weeks. Coumadin Clinic [phone];

## 2023-05-10 ENCOUNTER — Encounter: Payer: Self-pay | Admitting: Pharmacist

## 2023-05-10 ENCOUNTER — Telehealth: Payer: Self-pay | Admitting: Cardiology

## 2023-05-10 ENCOUNTER — Ambulatory Visit: Payer: Medicare Other | Attending: Cardiology | Admitting: Pharmacist

## 2023-05-10 DIAGNOSIS — R1032 Left lower quadrant pain: Secondary | ICD-10-CM | POA: Insufficient documentation

## 2023-05-10 DIAGNOSIS — T466X5A Adverse effect of antihyperlipidemic and antiarteriosclerotic drugs, initial encounter: Secondary | ICD-10-CM

## 2023-05-10 DIAGNOSIS — E785 Hyperlipidemia, unspecified: Secondary | ICD-10-CM

## 2023-05-10 DIAGNOSIS — I739 Peripheral vascular disease, unspecified: Secondary | ICD-10-CM

## 2023-05-10 DIAGNOSIS — G720 Drug-induced myopathy: Secondary | ICD-10-CM

## 2023-05-10 DIAGNOSIS — K648 Other hemorrhoids: Secondary | ICD-10-CM | POA: Insufficient documentation

## 2023-05-10 DIAGNOSIS — I700 Atherosclerosis of aorta: Secondary | ICD-10-CM

## 2023-05-10 DIAGNOSIS — I25118 Atherosclerotic heart disease of native coronary artery with other forms of angina pectoris: Secondary | ICD-10-CM

## 2023-05-10 DIAGNOSIS — K625 Hemorrhage of anus and rectum: Secondary | ICD-10-CM | POA: Insufficient documentation
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
## 2023-05-10 NOTE — Telephone Encounter (Signed)
Pt is calling to ask Dr. Shari Prows what other medication she discussed with her at the last OV, that would help lower her LpA.   Pt states she saw Laural Golden Pharmacist with Lipid clinic today and he was also going to have the pt reach out to Dr. Shari Prows about this.   Pt states it was not Leqvia, repatha, or praluent.  Pt states she believes Dr. Shari Prows may have mentioned it being a trial medication.   Pt is intolerant to statins.  Pt would like for Dr. Shari Prows to advise on the name of the medication she mentioned at last OV and get back with Laural Golden [facility] about this, so that he can further assist the pt with this matter.   Pt did see Laural Golden [facility] today at our lipid clinic at [facility].  See note referenced from that visit.   Pt is aware that I will route this message to Dr. Shari Prows to further review and advise on.  Pt is aware that once Dr. Shari Prows advises, a triage nurse will follow-up with her accordingly thereafter, for I will be out of the office for the next week. Also advised her she can check in with Laural Golden as well, for he can review what Dr. Shari Prows recommended.   Pt verbalized understanding and agrees with this plan.      1. Hyperlipidemia - Patient last LDL 78 which is above goal of <55. Aggressive goal due to PAD. Advised that the only injectable cholesterol lowering medications I was aware of were Leqvio, Praluent, and repatha. Patient reports none of these are what she would like. Thought perhaps patient was interested in enrolling in a trial. However she reports she will contact the office to find out which medication she should try.   Continue rosuvastatin 20mg  daily Continue ezetimibe 10mg  daily   Laural Golden, PharmD, BCACP, CDCES, CPP [address], [address] [place], [place], [address] Phone: [phone], Fax: [phone]

## 2023-05-10 NOTE — Telephone Encounter (Signed)
Pt calling to follow up regarding the LPa. Please advise

## 2023-05-11 NOTE — Telephone Encounter (Signed)
Pavero, Cristal Deer, [facility]  Loa Socks, LPN; Meriam Sprague, MD6 hours ago (9:08 AM)    Happy to email [facility] enrollment form to patient if she is willing to proceed    Meriam Sprague, MD  Loa Socks, LPN; Cheree Ditto, [facility]; [facility] Triage20 hours ago (7:19 PM)   HP I was hoping she was amenable to either repatha, praluent or inclisiran. Those are the only approved medications on the market that have shown reduction in Lp(a). The trials for the Lp(a) targeting meds are ongoing and those medications are not yet available. Sorry for the confusion.   Called and relayed above information to patient per [facility] permission on voicemail. Provided her the name, encouraged her to look Leqvio up and if wishing to proceed, call office back.

## 2023-05-14 ENCOUNTER — Encounter: Payer: Self-pay | Admitting: Cardiology

## 2023-05-14 ENCOUNTER — Telehealth: Payer: Self-pay | Admitting: Cardiology

## 2023-05-14 NOTE — Telephone Encounter (Signed)
Spoke with pt who is reporting she received a text message from the office that she is unable to access from her phone again.  Advised we do not have ability to text pt's messages however we can send them through MyChart.  She is requesting the information from the voicemail Sarah left her last week be resent.  Lars Mage, RN    05/11/23  4:02 PM Note  Pavero, Cristal Deer, RPH  Loa Socks, LPN; Meriam Sprague, MD6 hours ago (9:08 AM)      Happy to email [facility] enrollment form to patient if she is willing to proceed     Meriam Sprague, MD  Loa Socks, LPN; Cheree Ditto, [facility]; [facility] Triage20 hours ago (7:19 PM)    HP I was hoping she was amenable to either repatha, praluent or inclisiran. Those are the only approved medications on the market that have shown reduction in Lp(a). The trials for the Lp(a) targeting meds are ongoing and those medications are not yet available. Sorry for the confusion.    Called and relayed above information to patient per [facility] permission on voicemail. Provided her the name, encouraged her to look Leqvio up and if wishing to proceed, call office back.      Copied and pasted this information into a MyChart message and sent to pt as requested.  She will call back once she reviews the information regarding Leqvio.

## 2023-05-14 NOTE — Telephone Encounter (Signed)
Follow Up:      Patient said she was returning call  or text from Maralyn Sago from [email or web address]

## 2023-05-30 ENCOUNTER — Ambulatory Visit: Payer: Medicare Other | Attending: Cardiology | Admitting: Cardiology

## 2023-05-30 ENCOUNTER — Encounter: Payer: Self-pay | Admitting: Cardiology

## 2023-05-30 VITALS — BP 126/74 | HR 61 | Ht 63.0 in | Wt 154.4 lb

## 2023-05-30 DIAGNOSIS — D6869 Other thrombophilia: Secondary | ICD-10-CM | POA: Diagnosis not present

## 2023-05-30 DIAGNOSIS — I480 Paroxysmal atrial fibrillation: Secondary | ICD-10-CM

## 2023-05-30 DIAGNOSIS — I10 Essential (primary) hypertension: Secondary | ICD-10-CM

## 2023-05-30 DIAGNOSIS — I2510 Atherosclerotic heart disease of native coronary artery without angina pectoris: Secondary | ICD-10-CM | POA: Diagnosis not present

## 2023-05-30 NOTE — Patient Instructions (Signed)
 Medication Instructions:  °Your physician recommends that you continue on your current medications as directed. Please refer to the Current Medication list given to you today. ° °*If you need a refill on your cardiac medications before your next appointment, please call your pharmacy* ° ° °Lab Work: °None ordered ° ° °Testing/Procedures: °None ordered ° ° °Follow-Up: °At [facility], you and your health needs are our priority.  As part of our continuing mission to provide you with exceptional heart care, we have created designated Provider Care Teams.  These Care Teams include your primary Cardiologist (physician) and Advanced Practice Providers (APPs -  Physician Assistants and Nurse Practitioners) who all work together to provide you with the care you need, when you need it. ° °Your next appointment:   °1 year(s) ° °The format for your next appointment:   °In Person ° °Provider:   °Will Camnitz, MD ° ° ° °Thank you for choosing [facility]!! ° ° °Sherri Price, RN °[phone] ° ° ° °

## 2023-05-30 NOTE — Progress Notes (Signed)
  Electrophysiology Office Note:   Date:  05/30/2023  ID:  [name], [name] Apr 09, 1946, MRN [id number]  Primary Cardiologist: Meriam Sprague, MD Electrophysiologist: Sherryl Manges, MD      History of Present Illness:   [name] is a 77 y.o. female with h/o atrial fibrillation/flutter, coronary artery disease post CABG, COPD, CLL, lung cancer postresection seen today for routine electrophysiology followup.  Since last being seen in our clinic the patient reports doing well.  She has noted no further episodes of atrial fibrillation or atrial flutter.  She is able to do all her daily activities without restriction.  She has had no chest pain or shortness of breath.  She does note intermittent palpitations that lasts a second at a time that feel like a skipped beat but otherwise has no acute complaints.  she denies chest pain, palpitations, dyspnea, PND, orthopnea, nausea, vomiting, dizziness, syncope, edema, weight gain, or early satiety.   Review of systems complete and found to be negative unless listed in HPI.   EP Information / Studies Reviewed:    EKG is ordered today. Personal review as below.  EKG Interpretation Date/Time:  Wednesday May 30 2023 09:49:57 EDT Ventricular Rate:  61 PR Interval:  162 QRS Duration:  86 QT Interval:  418 QTC Calculation: 420 R Axis:   21  Text Interpretation: Normal sinus rhythm Cannot rule out Anterior infarct , age undetermined When compared with ECG of 03-Nov-2022 13:59, No significant change was found Confirmed by Jhonatan Lomeli ([id number]) on 05/30/2023 10:07:57 AM     Risk Assessment/Calculations:    CHA2DS2-VASc Score = 5   This indicates a 7.2% annual risk of stroke. The patient's score is based upon: CHF History: 0 HTN History: 1 Diabetes History: 0 Stroke History: 0 Vascular Disease History: 1 Age Score: 2 Gender Score: 1             Physical Exam:   VS:  BP 126/74   Pulse 61   Ht 5\' 3"  (1.6 m)   Wt 154 lb 6.4 oz  (70 kg)   SpO2 98%   BMI 27.35 kg/m    Wt Readings from Last 3 Encounters:  05/30/23 154 lb 6.4 oz (70 kg)  04/13/23 154 lb 12.8 oz (70.2 kg)  04/03/23 158 lb 3.2 oz (71.8 kg)     GEN: Well nourished, well developed in no acute distress NECK: No JVD; No carotid bruits CARDIAC: Regular rate and rhythm, no murmurs, rubs, gallops RESPIRATORY:  Clear to auscultation without rales, wheezing or rhonchi  ABDOMEN: Soft, non-tender, non-distended EXTREMITIES:  No edema; No deformity   ASSESSMENT AND PLAN:    1.  Paroxysmal atrial fibrillation/typical and atypical atrial flutter: CHA2DS2-VASc of 4.  Currently on warfarin.  Post ablation 02/09/2022.  Has remained in sinus rhythm.  Jalisa Sacco continue with current management.  2.  Coronary artery disease: Post CABG in 2008.  No current chest pain.  3.  Hypertension: Currently well-controlled  4.  Hyperlipidemia: Continue statin and Zetia per primary cardiology  5.  Secondary hypercoagulable state: Currently on warfarin for atrial fibrillation  Follow up with Afib Clinic in 12 months  Signed, Yale Golla Jorja Loa, MD

## 2023-06-04 ENCOUNTER — Other Ambulatory Visit: Payer: Self-pay | Admitting: *Deleted

## 2023-06-04 MED ORDER — METOPROLOL SUCCINATE ER 25 MG PO TB24: 25.0000 mg | ORAL_TABLET | Freq: Every day | ORAL | 3 refills | Status: DC

## 2023-06-06 ENCOUNTER — Ambulatory Visit: Payer: Medicare Other | Attending: Cardiovascular Disease

## 2023-06-06 DIAGNOSIS — Z5181 Encounter for therapeutic drug level monitoring: Secondary | ICD-10-CM | POA: Insufficient documentation

## 2023-06-06 DIAGNOSIS — I480 Paroxysmal atrial fibrillation: Secondary | ICD-10-CM | POA: Diagnosis not present

## 2023-06-06 LAB — POCT INR: INR: 2.7 (ref 2.0–3.0)

## 2023-06-06 NOTE — Patient Instructions (Signed)
CONTINUE TAKING WARFARIN 1 TABLET DAILY, EXCEPT 0.5 TABLET ON WEDNESDAYS. Recheck INR in 4 weeks. Coumadin Clinic [phone];

## 2023-06-12 DIAGNOSIS — L821 Other seborrheic keratosis: Secondary | ICD-10-CM | POA: Diagnosis not present

## 2023-06-12 DIAGNOSIS — D2271 Melanocytic nevi of right lower limb, including hip: Secondary | ICD-10-CM | POA: Diagnosis not present

## 2023-06-12 DIAGNOSIS — D2272 Melanocytic nevi of left lower limb, including hip: Secondary | ICD-10-CM | POA: Diagnosis not present

## 2023-06-12 DIAGNOSIS — D225 Melanocytic nevi of trunk: Secondary | ICD-10-CM | POA: Diagnosis not present

## 2023-06-12 DIAGNOSIS — D2262 Melanocytic nevi of left upper limb, including shoulder: Secondary | ICD-10-CM | POA: Diagnosis not present

## 2023-06-12 DIAGNOSIS — Z85828 Personal history of other malignant neoplasm of skin: Secondary | ICD-10-CM | POA: Diagnosis not present

## 2023-06-25 DIAGNOSIS — R5383 Other fatigue: Secondary | ICD-10-CM | POA: Diagnosis not present

## 2023-06-25 DIAGNOSIS — C9110 Chronic lymphocytic leukemia of B-cell type not having achieved remission: Secondary | ICD-10-CM | POA: Diagnosis not present

## 2023-07-04 ENCOUNTER — Ambulatory Visit: Payer: Medicare Other | Attending: Cardiology

## 2023-07-04 DIAGNOSIS — Z5181 Encounter for therapeutic drug level monitoring: Secondary | ICD-10-CM | POA: Insufficient documentation

## 2023-07-04 DIAGNOSIS — I480 Paroxysmal atrial fibrillation: Secondary | ICD-10-CM | POA: Diagnosis not present

## 2023-07-04 LAB — POCT INR: INR: 5.3 — AB (ref 2.0–3.0)

## 2023-07-04 NOTE — Patient Instructions (Addendum)
Description   Hold today's dose and tomorrow's dose and then START TAKING WARFARIN 1 TABLET DAILY, EXCEPT 0.5 TABLET ON MONDAYS AND WEDNESDAYS.  Recheck INR in 1 week.  Coumadin Clinic [phone]

## 2023-07-11 ENCOUNTER — Ambulatory Visit: Payer: Medicare Other | Attending: Cardiovascular Disease

## 2023-07-11 DIAGNOSIS — Z5181 Encounter for therapeutic drug level monitoring: Secondary | ICD-10-CM

## 2023-07-11 DIAGNOSIS — I480 Paroxysmal atrial fibrillation: Secondary | ICD-10-CM

## 2023-07-11 LAB — POCT INR: INR: 2.3 (ref 2.0–3.0)

## 2023-07-11 NOTE — Patient Instructions (Signed)
Continue 1 TABLET DAILY, EXCEPT 0.5 TABLET ON MONDAYS AND WEDNESDAYS.  Recheck INR in 3 weeks.  Coumadin Clinic [phone]

## 2023-07-17 ENCOUNTER — Ambulatory Visit: Payer: Medicare Other | Attending: Cardiovascular Disease | Admitting: Cardiovascular Disease

## 2023-07-17 ENCOUNTER — Encounter: Payer: Self-pay | Admitting: Cardiovascular Disease

## 2023-07-17 VITALS — BP 116/72 | HR 58 | Ht 63.0 in | Wt 155.8 lb

## 2023-07-17 DIAGNOSIS — I10 Essential (primary) hypertension: Secondary | ICD-10-CM | POA: Diagnosis not present

## 2023-07-17 DIAGNOSIS — R5381 Other malaise: Secondary | ICD-10-CM | POA: Diagnosis not present

## 2023-07-17 DIAGNOSIS — E785 Hyperlipidemia, unspecified: Secondary | ICD-10-CM | POA: Diagnosis not present

## 2023-07-17 DIAGNOSIS — I739 Peripheral vascular disease, unspecified: Secondary | ICD-10-CM | POA: Insufficient documentation

## 2023-07-17 DIAGNOSIS — C9110 Chronic lymphocytic leukemia of B-cell type not having achieved remission: Secondary | ICD-10-CM | POA: Diagnosis not present

## 2023-07-17 DIAGNOSIS — I701 Atherosclerosis of renal artery: Secondary | ICD-10-CM | POA: Insufficient documentation

## 2023-07-17 DIAGNOSIS — I480 Paroxysmal atrial fibrillation: Secondary | ICD-10-CM | POA: Diagnosis not present

## 2023-07-17 DIAGNOSIS — I2510 Atherosclerotic heart disease of native coronary artery without angina pectoris: Secondary | ICD-10-CM | POA: Diagnosis not present

## 2023-07-17 DIAGNOSIS — Z79899 Other long term (current) drug therapy: Secondary | ICD-10-CM | POA: Diagnosis not present

## 2023-07-17 DIAGNOSIS — R799 Abnormal finding of blood chemistry, unspecified: Secondary | ICD-10-CM | POA: Diagnosis not present

## 2023-07-17 DIAGNOSIS — R5383 Other fatigue: Secondary | ICD-10-CM | POA: Diagnosis not present

## 2023-07-17 DIAGNOSIS — M3501 Sicca syndrome with keratoconjunctivitis: Secondary | ICD-10-CM | POA: Diagnosis not present

## 2023-07-17 DIAGNOSIS — D6869 Other thrombophilia: Secondary | ICD-10-CM | POA: Diagnosis not present

## 2023-07-17 DIAGNOSIS — M810 Age-related osteoporosis without current pathological fracture: Secondary | ICD-10-CM | POA: Diagnosis not present

## 2023-07-17 NOTE — Patient Instructions (Addendum)
Medication Instructions:  Take 81 mg baby aspirin for the 4 days that you hold the coumadin for the procedure  *If you need a refill on your cardiac medications before your next appointment, please call your pharmacy*   Lab Work: Your provider would like for you to return on  9/27  to have the following labs drawn: CBC, BMET and PT/INR. You do not need an appointment for the lab. Once in our office lobby there is a podium where you can sign in and ring the doorbell to alert [place] that you are here. The lab is open from 8:00 am to 4 pm; closed for lunch from 12:45pm-1:45pm.  You may also go to any of these [facility] locations:   [facility] - [address] ([facility]) - [address] [phone] [address]   [place] - [address]    [place]  - [address]    [place] - [address] - [address] [employer]  - [address] [place] - [address] Chief Technology Officer)  If you have labs (blood work) drawn today and your tests are completely normal, you will receive your results only by: [employer] (if you have MyChart) OR A paper copy in the mail If you have any lab test that is abnormal or we need to change your treatment, we will call you to review the results.   Testing/Procedures: Your physician has requested that you have a peripheral vascular angiogram. This exam is performed at the hospital. During this exam IV contrast is used to look at arterial blood flow. Please review the information sheet given for details.  Follow-Up: At [facility], you and your health needs are our priority.  As part of our continuing mission to provide you with exceptional heart care, we have created designated Provider Care Teams.  These Care Teams include your primary Cardiologist (physician) and Advanced Practice Providers (APPs -  Physician Assistants and Nurse  Practitioners) who all work together to provide you with the care you need, when you need it.  We recommend signing up for the patient portal called "MyChart".  Sign up information is provided on this After Visit Summary.  MyChart is used to connect with patients for Virtual Visits (Telemedicine).  Patients are able to view lab/test results, encounter notes, upcoming appointments, etc.  Non-urgent messages can be sent to your provider as well.   To learn more about what you can do with MyChart, go to [email or web address].    Your next appointment:   2 month(s)  Provider:   Dr. Kirke Corin  Other Instructions  [facility] DEPT OF [place]. [facility] AT [facility] [address] [place] [address] Dept: [phone] Loc: [phone]  [name]  07/17/2023  You are scheduled for a Peripheral Angiogram on Wednesday, October 2 with Dr. Lorine Bears.  1. Please arrive at the [facility] (Main Entrance A) at [facility]: [address] [place], [place] [address] at 6:30 AM (This time is 2 hour(s) before your procedure to ensure your preparation). Free valet parking service is available. You will check in at ADMITTING. The support person will be asked to wait in the waiting room.  It is OK to have someone drop you off and come back when you are ready to be discharged.  Special note: Every effort is made to have your procedure done on time. Please understand that emergencies sometimes delay scheduled procedures.  2. Diet: Do not eat solid foods after midnight.  The patient may have clear liquids until 5am upon the day of the procedure.  3. Labs: You will need to have blood drawn on 9/27. You do not need to be fasting.  4. Medication instructions in preparation for your procedure: Hold the coumadin 4 days prior to the procedure  -take the 81 mg aspirin for those 4 days  On the morning of your procedure, take  your Aspirin 81 mg and any morning medicines NOT listed above.  You may use sips of water.  5. Plan to go home the same day, you will only stay overnight if medically necessary. 6. Bring a current list of your medications and current insurance cards. 7. You MUST have a responsible person to drive you home. 8. Someone MUST be with you the first 24 hours after you arrive home or your discharge will be delayed. 9. Please wear clothes that are easy to get on and off and wear slip-on shoes.  Thank you for allowing [place] to care for you!   -- [facility]
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
## 2023-07-18 ENCOUNTER — Encounter: Payer: Self-pay | Admitting: Cardiovascular Disease

## 2023-07-23 ENCOUNTER — Telehealth: Payer: Self-pay | Admitting: Cardiovascular Disease

## 2023-07-23 DIAGNOSIS — J029 Acute pharyngitis, unspecified: Secondary | ICD-10-CM | POA: Diagnosis not present

## 2023-07-23 DIAGNOSIS — U071 COVID-19: Secondary | ICD-10-CM | POA: Diagnosis not present

## 2023-07-23 NOTE — Telephone Encounter (Signed)
I spoke with patient and informed her that Paxlovid will not interact with Warfarin.  She verbalized understanding.

## 2023-07-23 NOTE — Telephone Encounter (Signed)
Will forward this to the Coumadin Clinic.

## 2023-07-23 NOTE — Telephone Encounter (Signed)
Pt c/o medication issue:  1. Name of Medication: warfarin (COUMADIN) 2.5 MG tablet   2. How are you currently taking this medication (dosage and times per day)?   3. Are you having a reaction (difficulty breathing--STAT)?   4. What is your medication issue? Patient is calling because she is positive for COVID and was prescribed Paxlovid. She wants to know if she needs to adjust her warfarin.  Please advise.

## 2023-07-24 ENCOUNTER — Telehealth: Payer: Self-pay | Admitting: Pulmonary Disease

## 2023-07-24 NOTE — Telephone Encounter (Signed)
Spoke with the pt  She started with symptoms- cough, fever, fatigue, rhinitis 3 days ago  She is taking tylenol  She has been wheezing some and has had increased SOB  She has had to urinary issues with anoro- not steady flow  She asks for advice on covid 19 symptoms and also side effects of anoro

## 2023-07-24 NOTE — Telephone Encounter (Signed)
Patient tested positive for Covid 19. Patient having symptoms of headache, cough, fever, runny nose and mucus. Pharmacy is [facility]. Currently using Anoro and bladder is emptying slowly. Patient phone number is [phone].

## 2023-07-25 ENCOUNTER — Encounter: Payer: Self-pay | Admitting: Pulmonary Disease

## 2023-07-25 ENCOUNTER — Encounter: Payer: Self-pay | Admitting: Cardiovascular Disease

## 2023-07-25 ENCOUNTER — Telehealth (INDEPENDENT_AMBULATORY_CARE_PROVIDER_SITE_OTHER): Payer: Medicare Other | Admitting: Pulmonary Disease

## 2023-07-25 DIAGNOSIS — U071 COVID-19: Secondary | ICD-10-CM

## 2023-07-25 DIAGNOSIS — J432 Centrilobular emphysema: Secondary | ICD-10-CM

## 2023-07-25 MED ORDER — FLUTICASONE-SALMETEROL 250-50 MCG/ACT IN AEPB: 1.0000 | INHALATION_SPRAY | Freq: Two times a day (BID) | RESPIRATORY_TRACT | 3 refills | Status: DC

## 2023-07-25 NOTE — Telephone Encounter (Signed)
Error

## 2023-07-25 NOTE — Progress Notes (Signed)
Virtual Visit via Video Note  I connected with [name] on 07/25/23 at  9:30 AM EDT by a video enabled telemedicine application and verified that I am speaking with the correct person using two identifiers.  Location: Patient: Home Provider: Office - [facility] - [address] [place], [address], [place], [place] [address]  I discussed the limitations of evaluation and management by telemedicine and the availability of in person appointments. The patient expressed understanding and agreed to proceed. I also discussed with the patient that there may be a patient responsible charge related to this service. The patient expressed understanding and agreed to proceed.  Patient consented to consult via telephone: Yes People present and their role in pt care: Pt   History of Present Illness:  77 y.o. woman with suspected largely group 2 pulm HTN presents for acute visti after covid infection.  3 to 4 days now of symptoms.  Has received Paxlovid from other office.  Fatigue malaise fever.  Sore throat.  Some cough and congestion.  Slowly starting to improve.  This morning seems a bit better than yesterday.  First day she is felt a bit better.  She denies any wheezing.  We discussed expectant management at length.  Having issues with Anoro.  Some difficulty emptying bladder.  Possible anticholinergic effect of the LAMA.  She failed LAMA therapy alone.  Trelegy did not help.  Discussed trying ICS/LABA alone just in case there can be some benefit.  Prescribed Advair today discussed below.  Chief complaint:   COVID +, congestion, body aches  Observations/Objective:  Fatigued female sitting in chair, speaks in full sentences without significant dyspnea, voice sounds a bit raspy  Social History   Tobacco Use  Smoking Status Former   Current packs/day: 0.00   Average packs/day: 1 pack/day for 40.0 years (40.0 ttl pk-yrs)   Types: Cigarettes   Start date: 09/15/1967   Quit date: 09/15/2007    Years since quitting: 15.8  Smokeless Tobacco Never  Tobacco Comments   Former smoker 11/24/2021   Immunization History  Administered Date(s) Administered   Covid-19, Mrna,Vaccine(Spikevax)70yrs and older 08/15/2022   Fluad Quad(high Dose 65+) 08/16/2021   Influenza, High Dose Seasonal PF 09/09/2014, 09/06/2015, 08/30/2017, 09/16/2018, 08/06/2019   Influenza-Unspecified 09/16/2012, 09/22/2013, 09/09/2014, 09/06/2015, 09/25/2016, 09/16/2018, 08/18/2019, 08/23/2020, 08/02/2022   Moderna Sars-Covid-2 Vaccination 11/25/2019, 12/22/2019, 09/13/2020, 03/29/2021, 08/15/2022   Pneumococcal Conjugate-13 08/06/2015   Pneumococcal Polysaccharide-23 02/21/2017   Td 07/03/2019   Zoster, Live 08/13/2012, 09/16/2012    Assessment and Plan:  Acute COVID infection: Continue Paxlovid as prescribed by other provider.  No role for steroids etc. at this time.  Slowly improving.  Expect will continue improve in the coming days.  Dyspnea on exertion: Some help with Anoro both side effects, bladder slowly emptying etc. worrisome.  Will stop this at this time.  Trial Advair mid dose discus 1 puff twice a day.  Has not improved with Trelegy in the past.  No better with Serevent.  Follow Up Instructions:  Return in about 3 months (around 10/24/2023).    I discussed the assessment and treatment plan with the patient. The patient was provided an opportunity to ask questions and all were answered. The patient agreed with the plan and demonstrated an understanding of the instructions.   The patient was advised to call back or seek an in-person evaluation if the symptoms worsen or if the condition fails to improve as anticipated.  I spent 41 minutes in the care of the  patient last majority minutes was spent in time via virtual visit, also including coordination of care, review of records.   Karren Burly, MD

## 2023-07-27 NOTE — Telephone Encounter (Signed)
Looks like message was never routed when pt called the other day. Routing to Dr. Judeth Horn.

## 2023-07-30 NOTE — Telephone Encounter (Signed)
Patient was seen. 

## 2023-07-31 ENCOUNTER — Ambulatory Visit: Payer: Medicare Other | Attending: Cardiology | Admitting: Pharmacist Clinician (PhC)/ Clinical Pharmacy Specialist

## 2023-07-31 DIAGNOSIS — E785 Hyperlipidemia, unspecified: Secondary | ICD-10-CM | POA: Insufficient documentation

## 2023-07-31 NOTE — Patient Instructions (Signed)
Your Results:             Your most recent labs Goal  Total Cholesterol 162 < 200  Triglycerides 64 < 150  HDL (happy/good cholesterol) 71 > 40  LDL (lousy/bad cholesterol 78 < 55   Medication changes:  We will start the process to get Leqvio covered by your insurance.  Once the prior authorization is complete, I will call/send a MyChart message to let you know and confirm pharmacy information.   You will take one injection every 3 months for the first 2 doses, then every 6 months thereafter.  You will get a call from the [facility] center to schedule this, is 2-3 weeks,  I will reach to Medication Management about the Lp(a) trial.  You will hear from them directly.  Lab orders:  We want to repeat labs after 2-3 months.  We will send you a lab order to remind you once we get closer to that time.     Thank you for choosing [facility]

## 2023-07-31 NOTE — Progress Notes (Unsigned)
Office Visit    Patient Name: [name] Date of Encounter: 08/01/2023  Primary Care Provider:  Jerl Mina, MD Primary Cardiologist:  Meriam Sprague, MD  Chief Complaint    Hyperlipidemia   Significant Past Medical History   PAD Bilateral common iliac atherectomy, covered kissing stent placement  RAS 1-59% left RAS  (also showed 70-99% stenosis of celiac artery)  PAF CHADS2-VASc = 5, on warfarin  CAD 2008 CABG x 3  HTN Controlled on current medications  CLL 8/24 WBC 16.7     Allergies  Allergen Reactions   Amiodarone Anaphylaxis and Other (See Comments)    Angioedema, swelling of throat and tongue   Amlodipine Other (See Comments)    Pt states that she just can not tolerate, states blood pressure goes up and down and experiences leg heaviness.   Clindamycin/Lincomycin Swelling    TROUBLE SWALLOWING......SEVERE CHEST PAIN   Doxycycline Other (See Comments)    blistering   Sulfa Antibiotics Rash and Other (See Comments)    Red, burning rash & paralysis Burning Rash   Lipitor [Atorvastatin] Other (See Comments)    MYALGIAS LEG PAIN   Phenergan [Promethazine Hcl] Other (See Comments)    Nervous Leg / Restless Leg Syndrome   Reclast [Zoledronic Acid] Other (See Comments)    Flu symptoms - made pt very sick, and had inflammation in her eye   Carvedilol Other (See Comments)    Per patient was "wiped out"  for 2 days after taking     Cephalexin     Unknown reaction    Dofetilide     Muscle Pain Lumbar pain and effects walking   Ketorolac Nausea Only    headache   Diltiazem Other (See Comments)    Weakness on oral Dilt, tolerating 120 mg currently   Latex Rash    History of Present Illness    [name] is a 77 y.o. female patient of Dr Kirke Corin, in the office today to discuss options for cholesterol management.  We have had multiple discussions with Ms Vissers over the years about different options for cholesterol management.  She is currently  tolerating rosuvastatin 20 mg and ezetimibe 10, however LDL is still not at goal of < 55.   Today she notes she is interested in lowering the Lp(a).  Explained that while PCSK9 inhibitors can lower Lp(a) by as much as 20-25%, inclisiran doesn't have the same data.  When she saw Laural Golden back in June she was hoping to start a new injection, but did not know the name.  Dr. Shari Prows was consulted and stated hoped patient was amenable to PCSK9 or inclisiran.    Insurance Carrier:  BCBS - medicare supplement  LDL Cholesterol goal:  LDL < 55  Current Medications:   rosuvastatin 20 mg, ezetimibe 10 mg   Previously tried:  atorvastatin, simvastatin,  alirocumab  Family Hx: mother had htn, stents, dad had first MI at 66; 5 brothers - 4 have various ASCVD    Accessory Clinical Findings   Lab Results  Component Value Date   CHOL 162 04/06/2023   HDL 71 04/06/2023   LDLCALC 78 04/06/2023   TRIG 64 04/06/2023   CHOLHDL 2.3 04/06/2023    Lipoprotein (a)  Date/Time Value Ref Range Status  01/02/2023 11:52 AM 234.6 (H) <75.0 nmol/L Final    Comment:    **Results verified by repeat testing** Note:  Values greater than or equal to 75.0 nmol/L may  indicate an independent risk factor for CHD,        but must be evaluated with caution when applied        to non-Caucasian populations due to the        influence of genetic factors on Lp(a) across        ethnicities.     Lab Results  Component Value Date   ALT 19 02/08/2022   AST 24 02/08/2022   ALKPHOS 38 02/08/2022   BILITOT 0.6 02/08/2022   Lab Results  Component Value Date   CREATININE 0.69 06/26/2022   BUN 13 06/26/2022   NA 133 (L) 06/26/2022   K 4.5 06/26/2022   CL 97 06/26/2022   CO2 24 06/26/2022   Lab Results  Component Value Date   HGBA1C 5.7 (H) 02/21/2013    Home Medications    Current Outpatient Medications  Medication Sig Dispense Refill   acetaminophen (TYLENOL) 500 MG tablet Take 1,000 mg by mouth  every 8 (eight) hours as needed (pain.).      albuterol (VENTOLIN HFA) 108 (90 Base) MCG/ACT inhaler Inhale 2 puffs into the lungs every 6 (six) hours as needed for wheezing or shortness of breath. 8 g 5   Calcium Carbonate (CALTRATE 600 PO)      cholecalciferol (VITAMIN D3) 25 MCG (1000 UNIT) tablet Take 1,000 Units by mouth daily.     co-enzyme Q-10 30 MG capsule Take 30 mg by mouth daily.     docusate sodium (COLACE) 100 MG capsule Take 200 mg by mouth daily. Take 1 tablet daily     ezetimibe (ZETIA) 10 MG tablet Take 1 tablet (10 mg total) by mouth daily. 90 tablet 3   fluticasone-salmeterol (ADVAIR) 250-50 MCG/ACT AEPB Inhale 1 puff into the lungs in the morning and at bedtime. 1 each 3   hydrocortisone valerate cream (WESTCORT) 0.2 % Apply 1 application topically as needed (for eczema).     isosorbide mononitrate (IMDUR) 30 MG 24 hr tablet Take 1 tablet by mouth once daily 90 tablet 3   lisinopril (ZESTRIL) 20 MG tablet Take 1 tablet (20 mg total) by mouth in the morning and at bedtime. 180 tablet 3   metoprolol succinate (TOPROL-XL) 25 MG 24 hr tablet Take 1 tablet (25 mg total) by mouth daily. 90 tablet 3   metoprolol tartrate (LOPRESSOR) 25 MG tablet Take 25 mg by mouth as needed. (Patient not taking: Reported on 07/25/2023)     MIEBO 1.338 GM/ML SOLN Apply to eye. 1 drop 4 times per day in each eye     nitroGLYCERIN (NITROSTAT) 0.4 MG SL tablet Place 1 tablet (0.4 mg total) under the tongue every 5 (five) minutes x 3 doses as needed for chest pain. 25 tablet 2   pyridOXINE (VITAMIN B-6) 100 MG tablet Take 100 mg by mouth daily at 12 noon.     rosuvastatin (CRESTOR) 20 MG tablet Take 1 tablet by mouth once daily 90 tablet 3   valACYclovir (VALTREX) 500 MG tablet Take 500 mg by mouth daily.     vitamin B-12 (CYANOCOBALAMIN) 1000 MCG tablet Take 1,000 mcg by mouth daily at 12 noon.     warfarin (COUMADIN) 2.5 MG tablet TAKE 1/2 TABLET TO 1 TABLET BY MOUTH ONCE DAILY OR AS PRESCRIBED BY  CLINIC 90 tablet 2   No current facility-administered medications for this visit.     Assessment & Plan    Hyperlipidemia LDL goal <70 Assessment: Patient with ASCVD not at LDL goal  of < 55 Most recent LDL 78 on 04/06/23 Has been compliant with high intensity statin/ezetimibe : rosuvastatin 20, ezetimibe 10  Not able to tolerate statins secondary to myalgias Reviewed options for lowering LDL cholesterol - inclisiran.  Discussed mechanisms of action, dosing, side effects, potential decreases in LDL cholesterol and costs.  Also reviewed potential options for patient assistance. Discussed option of clinical trial for Lp(a) lowering.  Explained that we want to move forward with inclisiran as well.    Plan: Patient agreeable to starting Leqvio Repeat labs after:  4 months Lipid Liver function Patient signed benefits investigation form, will submit to [employer]   Phillips Hay, PharmD CPP [facility] [address]  [place], [place] [address] [phone]  08/01/2023, 10:31 AM

## 2023-08-01 ENCOUNTER — Encounter: Payer: Self-pay | Admitting: Pharmacist Clinician (PhC)/ Clinical Pharmacy Specialist

## 2023-08-01 NOTE — Assessment & Plan Note (Signed)
Assessment: Patient with ASCVD not at LDL goal of < 55 Most recent LDL 78 on 04/06/23 Has been compliant with high intensity statin/ezetimibe : rosuvastatin 20, ezetimibe 10  Not able to tolerate statins secondary to myalgias Reviewed options for lowering LDL cholesterol - inclisiran.  Discussed mechanisms of action, dosing, side effects, potential decreases in LDL cholesterol and costs.  Also reviewed potential options for patient assistance. Discussed option of clinical trial for Lp(a) lowering.  Explained that we want to move forward with inclisiran as well.    Plan: Patient agreeable to starting Leqvio Repeat labs after:  4 months Lipid Liver function Patient signed benefits investigation form, will submit to [employer]

## 2023-08-02 DIAGNOSIS — U071 COVID-19: Secondary | ICD-10-CM | POA: Diagnosis not present

## 2023-08-02 DIAGNOSIS — Z6827 Body mass index (BMI) 27.0-27.9, adult: Secondary | ICD-10-CM | POA: Diagnosis not present

## 2023-08-05 DIAGNOSIS — H1033 Unspecified acute conjunctivitis, bilateral: Secondary | ICD-10-CM | POA: Diagnosis not present

## 2023-08-06 ENCOUNTER — Telehealth: Payer: Self-pay | Admitting: Cardiovascular Disease

## 2023-08-06 NOTE — Telephone Encounter (Signed)
Spoke with patient and she requested to cancel procedure for now. She states that once she is feeling better she will give [place] a call to get it rescheduled. No further needs.

## 2023-08-06 NOTE — Telephone Encounter (Signed)
Pt called in asking to cancel her procedure she has 10/2 with Dr. Kirke Corin due to lingering symptoms of COVID. She states she does not wish to r/s right now until she is feeling better and she would like for nurse to call and confirm the procedure was cancelled.

## 2023-08-07 ENCOUNTER — Telehealth: Payer: Self-pay

## 2023-08-07 ENCOUNTER — Other Ambulatory Visit (HOSPITAL_BASED_OUTPATIENT_CLINIC_OR_DEPARTMENT_OTHER): Payer: Self-pay | Admitting: Pharmacist Clinician (PhC)/ Clinical Pharmacy Specialist

## 2023-08-07 NOTE — Telephone Encounter (Signed)
Auth Submission: NO AUTH NEEDED Site of care: Site of care: [facility] Payer: Medicare a/b, bcbs supp Medication & CPT/J Code(s) submitted: Leqvio (Inclisiran) J1306 Route of submission (phone, fax, portal): phone Phone # Fax # Auth type: Buy/Bill HB Units/visits requested: 284mg , q33months, 2 doses Reference number:  Approval from: 08/07/23 to 11/13/23

## 2023-08-07 NOTE — Progress Notes (Signed)
Leqvio order placed

## 2023-08-08 ENCOUNTER — Ambulatory Visit: Payer: Medicare Other | Attending: Internal Medicine

## 2023-08-08 DIAGNOSIS — I480 Paroxysmal atrial fibrillation: Secondary | ICD-10-CM | POA: Diagnosis not present

## 2023-08-08 DIAGNOSIS — Z5181 Encounter for therapeutic drug level monitoring: Secondary | ICD-10-CM | POA: Diagnosis not present

## 2023-08-08 LAB — POCT INR: INR: 2.8 (ref 2.0–3.0)

## 2023-08-08 NOTE — Patient Instructions (Signed)
Continue 1 TABLET DAILY, EXCEPT 0.5 TABLET ON MONDAYS AND WEDNESDAYS. Eat greens today Recheck INR in 3 weeks.  [facility] [phone]

## 2023-08-08 NOTE — Telephone Encounter (Signed)
That is fine.  She can follow-up with me in November as planned.

## 2023-08-13 ENCOUNTER — Telehealth: Payer: Self-pay | Admitting: Pharmacist Clinician (PhC)/ Clinical Pharmacy Specialist

## 2023-08-13 NOTE — Telephone Encounter (Signed)
Pt c/o medication issue:  1. Name of Medication: Leqvio   2. How are you currently taking this medication (dosage and times per day)? Not yet started   3. Are you having a reaction (difficulty breathing--STAT)?   4. What is your medication issue?  Patient is requesting call back from [facility], [facility] in regards not being eligible to receive this medication. Please advise.

## 2023-08-13 NOTE — Telephone Encounter (Signed)
Patient stated she did not qualify for the free trial  for Lpa. She states she would like a call back from [place] to discuss next steps.

## 2023-08-14 NOTE — Telephone Encounter (Signed)
Returned call, patient aware that she may not qualify for studies because of diagnosis of CLL.

## 2023-08-15 ENCOUNTER — Encounter (HOSPITAL_COMMUNITY): Payer: Self-pay

## 2023-08-15 ENCOUNTER — Other Ambulatory Visit: Payer: Self-pay | Admitting: Internal Medicine

## 2023-08-15 ENCOUNTER — Ambulatory Visit (HOSPITAL_COMMUNITY): Admit: 2023-08-15 | Payer: Medicare Other | Admitting: Cardiovascular Disease

## 2023-08-15 DIAGNOSIS — Z01812 Encounter for preprocedural laboratory examination: Secondary | ICD-10-CM

## 2023-08-15 SURGERY — ABDOMINAL AORTOGRAM W/LOWER EXTREMITY
Anesthesia: LOCAL

## 2023-08-20 ENCOUNTER — Other Ambulatory Visit: Payer: Self-pay | Admitting: Pharmacist Clinician (PhC)/ Clinical Pharmacy Specialist

## 2023-08-20 ENCOUNTER — Telehealth: Payer: Self-pay | Admitting: Cardiology

## 2023-08-20 NOTE — Telephone Encounter (Signed)
Returned call to patient - she has reached out to the infusion center and is now scheduled

## 2023-08-20 NOTE — Telephone Encounter (Signed)
Pt c/o medication issue:  1. Name of Medication:  Leqvio  2. How are you currently taking this medication (dosage and times per day)?   3. Are you having a reaction (difficulty breathing--STAT)?   4. What is your medication issue?   Patient is requesting to speak directly with Belenda Cruise if possible to schedule an appointment for [facility].

## 2023-08-21 DIAGNOSIS — M8588 Other specified disorders of bone density and structure, other site: Secondary | ICD-10-CM | POA: Diagnosis not present

## 2023-08-27 ENCOUNTER — Ambulatory Visit: Payer: Medicare Other

## 2023-08-27 ENCOUNTER — Telehealth: Payer: Self-pay | Admitting: Cardiovascular Disease

## 2023-08-27 VITALS — BP 146/72 | HR 65 | Temp 98.0°F | Resp 18 | Ht 62.0 in | Wt 153.8 lb

## 2023-08-27 DIAGNOSIS — E785 Hyperlipidemia, unspecified: Secondary | ICD-10-CM

## 2023-08-27 MED ORDER — INCLISIRAN SODIUM 284 MG/1.5ML ~~LOC~~ SOSY
284.0000 mg | PREFILLED_SYRINGE | Freq: Once | SUBCUTANEOUS | Status: AC
  Administered 2023-08-27: 284 mg via SUBCUTANEOUS
  Filled 2023-08-27: qty 1.5

## 2023-08-27 NOTE — Telephone Encounter (Signed)
No, she needs to continue the rosuvastatin and the ezetimibe.We want to get her LDL-C <55. The best way we are going to do this is with all 3 medications.

## 2023-08-27 NOTE — Telephone Encounter (Signed)
Patient states she received her first Leqvio injection today. She wanted to know if she needs to stop crestor and Zetia.
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
## 2023-08-27 NOTE — Telephone Encounter (Signed)
Spoke w/ pt.   Advised her of Melissa's recommendation. She verbalizes understanding, but will double check w/ Dr. Kirke Corin at her appt w/ him tomorrow. She is appreciative of the call.

## 2023-08-27 NOTE — Progress Notes (Signed)
Diagnosis: Hyperlipidemia  Provider:  Chilton Greathouse MD  Procedure: Injection  Leqvio (inclisiran), Dose: 284 mg, Site: subcutaneous, Number of injections: 1  Post Care: Observation period completed  Discharge: Condition: Good, Destination: Home . AVS Provided  Performed by:  Garnette Czech, RN

## 2023-08-27 NOTE — Telephone Encounter (Signed)
Patient called to talk with Dr. Kirke Corin or nurse in regards to her medication
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
## 2023-08-28 ENCOUNTER — Encounter: Payer: Self-pay | Admitting: Cardiovascular Disease

## 2023-08-28 ENCOUNTER — Ambulatory Visit: Payer: Medicare Other | Attending: Cardiovascular Disease | Admitting: Cardiovascular Disease

## 2023-08-28 VITALS — BP 126/86 | HR 60 | Ht 62.0 in | Wt 154.0 lb

## 2023-08-28 DIAGNOSIS — I10 Essential (primary) hypertension: Secondary | ICD-10-CM | POA: Diagnosis not present

## 2023-08-28 DIAGNOSIS — I739 Peripheral vascular disease, unspecified: Secondary | ICD-10-CM | POA: Diagnosis not present

## 2023-08-28 DIAGNOSIS — I480 Paroxysmal atrial fibrillation: Secondary | ICD-10-CM | POA: Insufficient documentation

## 2023-08-28 DIAGNOSIS — E785 Hyperlipidemia, unspecified: Secondary | ICD-10-CM | POA: Diagnosis not present

## 2023-08-28 NOTE — Patient Instructions (Addendum)
Medication Instructions:  Take an 81 mg Aspirin for the 4 days you do not take the Coumadin prior to the procedure  *If you need a refill on your cardiac medications before your next appointment, please call your pharmacy*   Lab Work: Your provider would like for you to have the following labs today: CBC, INR, and BMET  If you have labs (blood work) drawn today and your tests are completely normal, you will receive your results only by: MyChart Message (if you have MyChart) OR A paper copy in the mail If you have any lab test that is abnormal or we need to change your treatment, we will call you to review the results.   Testing/Procedures: Your physician has requested that you have an ankle brachial index (ABI) before 11/1. During this test an ultrasound and blood pressure cuff are used to evaluate the arteries that supply the arms and legs with blood. Allow thirty minutes for this exam. There are no restrictions or special instructions. This will take place at [address], [address].    Follow-Up: At [facility], you and your health needs are our priority.  As part of our continuing mission to provide you with exceptional heart care, we have created designated Provider Care Teams.  These Care Teams include your primary Cardiologist (physician) and Advanced Practice Providers (APPs -  Physician Assistants and Nurse Practitioners) who all work together to provide you with the care you need, when you need it.  We recommend signing up for the patient portal called "MyChart".  Sign up information is provided on this After Visit Summary.  MyChart is used to connect with patients for Virtual Visits (Telemedicine).  Patients are able to view lab/test results, encounter notes, upcoming appointments, etc.  Non-urgent messages can be sent to your provider as well.   To learn more about what you can do with MyChart, go to [email or web address].    Keep your follow up as  scheduled  Other Instructions  [facility] A DEPT OF [place]. [facility] AT [facility] [address] [place] [address] [place] [address] Dept: [phone] Loc: [phone]  [name]  08/28/2023  You are scheduled for a Peripheral Angiogram on Wednesday, November 6 with Dr. Lorine Bears.  1. Please arrive at the [facility] (Main Entrance A) at [facility]: [address] [place], [place] [address] at 6:30 AM (This time is 2 hour(s) before your procedure to ensure your preparation). Free valet parking service is available. You will check in at ADMITTING. The support person will be asked to wait in the waiting room.  It is OK to have someone drop you off and come back when you are ready to be discharged.    Special note: Every effort is made to have your procedure done on time. Please understand that emergencies sometimes delay scheduled procedures.  2. Diet: Do not eat solid foods after midnight.  The patient may have clear liquids until 5am upon the day of the procedure.  3. Labs: You will need to have blood drawn on 08/28/23.You do not need to be fasting.  4. Medication instructions in preparation for your procedure:  Hold the Coumadin 4 days before the procedure (hold 11/2-11/5 and the morning of the procedure)  -Take a 81 mg once daily the four days that you do not take the coumadin  On the morning of your procedure, take your Aspirin 81 mg and any morning  medicines NOT listed above.  You may use sips of water.  5. Plan to go home the same day, you will only stay overnight if medically necessary. 6. Bring a current list of your medications and current insurance cards. 7. You MUST have a responsible person to drive you home. 8. Someone MUST be with you the first 24 hours after you arrive home or your discharge will be delayed. 9. Please wear clothes that are easy to get on and off and wear slip-on shoes.  Thank  you for allowing [place] to care for you!   -- [place] [facility]

## 2023-08-29 ENCOUNTER — Ambulatory Visit (INDEPENDENT_AMBULATORY_CARE_PROVIDER_SITE_OTHER): Payer: Self-pay | Admitting: Internal Medicine

## 2023-08-29 ENCOUNTER — Ambulatory Visit: Payer: Medicare Other

## 2023-08-29 ENCOUNTER — Telehealth: Payer: Self-pay

## 2023-08-29 ENCOUNTER — Encounter: Payer: Self-pay | Admitting: Cardiovascular Disease

## 2023-08-29 DIAGNOSIS — I480 Paroxysmal atrial fibrillation: Secondary | ICD-10-CM | POA: Diagnosis not present

## 2023-08-29 DIAGNOSIS — Z5181 Encounter for therapeutic drug level monitoring: Secondary | ICD-10-CM

## 2023-08-29 LAB — BASIC METABOLIC PANEL
BUN/Creatinine Ratio: 24 (ref 12–28)
BUN: 15 mg/dL (ref 8–27)
CO2: 25 mmol/L (ref 20–29)
Calcium: 9 mg/dL (ref 8.7–10.3)
Chloride: 100 mmol/L (ref 96–106)
Creatinine, Ser: 0.63 mg/dL (ref 0.57–1.00)
Glucose: 100 mg/dL — ABNORMAL HIGH (ref 70–99)
Potassium: 4.8 mmol/L (ref 3.5–5.2)
Sodium: 139 mmol/L (ref 134–144)
eGFR: 90 mL/min/{1.73_m2} (ref >59)

## 2023-08-29 LAB — CBC
Hematocrit: 42.5 % (ref 34.0–46.6)
Hemoglobin: 14.1 g/dL (ref 11.1–15.9)
MCH: 32.4 pg (ref 26.6–33.0)
MCHC: 33.2 g/dL (ref 31.5–35.7)
MCV: 98 fL — ABNORMAL HIGH (ref 79–97)
Platelets: 193 10*3/uL (ref 150–450)
RBC: 4.35 x10E6/uL (ref 3.77–5.28)
RDW: 13 % (ref 11.7–15.4)
WBC: 20 10*3/uL (ref 3.4–10.8)

## 2023-08-29 LAB — PROTIME-INR
INR: 2.9 — ABNORMAL HIGH (ref 0.9–1.2)
Prothrombin Time: 30.3 s — ABNORMAL HIGH (ref 9.1–12.0)

## 2023-08-29 NOTE — Telephone Encounter (Signed)
Lpm regarding INR result

## 2023-09-03 ENCOUNTER — Ambulatory Visit (HOSPITAL_COMMUNITY)
Admission: RE | Admit: 2023-09-03 | Discharge: 2023-09-03 | Disposition: A | Discharge status: Home / Self Care | Payer: Medicare Other | Source: Ambulatory Visit | Attending: Cardiovascular Disease | Admitting: Cardiovascular Disease

## 2023-09-03 DIAGNOSIS — I739 Peripheral vascular disease, unspecified: Secondary | ICD-10-CM | POA: Insufficient documentation

## 2023-09-03 LAB — VAS US ABI WITH/WO TBI
Left ABI: 0.49
Right ABI: 0.95

## 2023-09-05 ENCOUNTER — Telehealth: Payer: Self-pay | Admitting: Cardiovascular Disease

## 2023-09-05 ENCOUNTER — Telehealth: Payer: Self-pay

## 2023-09-05 DIAGNOSIS — Z8616 Personal history of COVID-19: Secondary | ICD-10-CM | POA: Diagnosis not present

## 2023-09-05 DIAGNOSIS — L538 Other specified erythematous conditions: Secondary | ICD-10-CM | POA: Diagnosis not present

## 2023-09-05 DIAGNOSIS — L820 Inflamed seborrheic keratosis: Secondary | ICD-10-CM | POA: Diagnosis not present

## 2023-09-05 DIAGNOSIS — M7130 Other bursal cyst, unspecified site: Secondary | ICD-10-CM | POA: Diagnosis not present

## 2023-09-05 DIAGNOSIS — I10 Essential (primary) hypertension: Secondary | ICD-10-CM | POA: Diagnosis not present

## 2023-09-05 DIAGNOSIS — L2989 Other pruritus: Secondary | ICD-10-CM | POA: Diagnosis not present

## 2023-09-05 DIAGNOSIS — R051 Acute cough: Secondary | ICD-10-CM | POA: Diagnosis not present

## 2023-09-05 NOTE — Telephone Encounter (Signed)
Pt is requesting a callback from nurse Casimiro Needle regarding changes in her surgery and her being put back on coumadin. Please advise

## 2023-09-05 NOTE — Telephone Encounter (Signed)
I spoke with patient and advised her to take 1.5 tablets of Warfarin today and then continue normal dose until upcoming HOLD for procedure.  She verbalized understanding.

## 2023-09-06 ENCOUNTER — Telehealth: Payer: Self-pay | Admitting: Pulmonary Disease

## 2023-09-06 NOTE — Telephone Encounter (Signed)
Not sure it matters. If she would like to re-test that is fine. The fact that she had acute symptoms after testing negative a few weeks ago make me think the result is likely accurate.

## 2023-09-06 NOTE — Telephone Encounter (Signed)
Spoke with the pt  She states that she tested pod for Covid 07/22/23 and was tx with paxlovid  She states she tested neg about 3 wks later and felt better overall  Then 5 days ago she started having fatigue, HA, dry cough and minimal wheezing at night  She has not had fever She went to see PCP yesterday and she tested pos for covid- the also tested for flu and rsv and these were negative  PCP gave flonase and tessalon  She is asking if she needs to have another covid test done bc she feels this last one was false pos  Please advise, thanks!

## 2023-09-06 NOTE — Telephone Encounter (Signed)
PT tested pos for Covid and would like a call back. She has questions. Her # [phone]

## 2023-09-06 NOTE — Telephone Encounter (Signed)
Spoke with the pt and notified of response per Dr Judeth Horn  She verbalized understanding and nothing further needed

## 2023-09-10 DIAGNOSIS — J310 Chronic rhinitis: Secondary | ICD-10-CM | POA: Diagnosis not present

## 2023-09-12 ENCOUNTER — Encounter: Payer: Self-pay | Admitting: Cardiovascular Disease

## 2023-09-12 NOTE — Telephone Encounter (Signed)
If her COVID symptoms are improved, we should be able to proceed with her procedure next week.

## 2023-09-13 DIAGNOSIS — C9110 Chronic lymphocytic leukemia of B-cell type not having achieved remission: Secondary | ICD-10-CM | POA: Diagnosis not present

## 2023-09-13 DIAGNOSIS — E785 Hyperlipidemia, unspecified: Secondary | ICD-10-CM | POA: Diagnosis not present

## 2023-09-13 DIAGNOSIS — M3500 Sicca syndrome, unspecified: Secondary | ICD-10-CM | POA: Diagnosis not present

## 2023-09-13 DIAGNOSIS — Z0000 Encounter for general adult medical examination without abnormal findings: Secondary | ICD-10-CM | POA: Diagnosis not present

## 2023-09-13 DIAGNOSIS — I10 Essential (primary) hypertension: Secondary | ICD-10-CM | POA: Diagnosis not present

## 2023-09-13 DIAGNOSIS — I480 Paroxysmal atrial fibrillation: Secondary | ICD-10-CM | POA: Diagnosis not present

## 2023-09-13 DIAGNOSIS — I739 Peripheral vascular disease, unspecified: Secondary | ICD-10-CM | POA: Diagnosis not present

## 2023-09-14 LAB — BASIC METABOLIC PANEL
BUN/Creatinine Ratio: 27 (ref 12–28)
BUN: 16 mg/dL (ref 8–27)
CO2: 24 mmol/L (ref 20–29)
Calcium: 9.5 mg/dL (ref 8.7–10.3)
Chloride: 93 mmol/L — ABNORMAL LOW (ref 96–106)
Creatinine, Ser: 0.59 mg/dL (ref 0.57–1.00)
Glucose: 91 mg/dL (ref 70–99)
Potassium: 4 mmol/L (ref 3.5–5.2)
Sodium: 136 mmol/L (ref 134–144)
eGFR: 92 mL/min/{1.73_m2} (ref >59)

## 2023-09-14 LAB — CBC
Hematocrit: 42.2 % (ref 34.0–46.6)
Hemoglobin: 14.1 g/dL (ref 11.1–15.9)
MCH: 32.3 pg (ref 26.6–33.0)
MCHC: 33.4 g/dL (ref 31.5–35.7)
MCV: 97 fL (ref 79–97)
Platelets: 201 10*3/uL (ref 150–450)
RBC: 4.36 x10E6/uL (ref 3.77–5.28)
RDW: 12.9 % (ref 11.7–15.4)
WBC: 20.6 10*3/uL (ref 3.4–10.8)

## 2023-09-14 LAB — PROTIME-INR
INR: 2.1 — ABNORMAL HIGH (ref 0.9–1.2)
Prothrombin Time: 21.9 s — ABNORMAL HIGH (ref 9.1–12.0)

## 2023-09-17 ENCOUNTER — Encounter: Payer: Self-pay | Admitting: Cardiovascular Disease

## 2023-09-18 ENCOUNTER — Telehealth: Payer: Self-pay | Admitting: *Deleted

## 2023-09-18 NOTE — Telephone Encounter (Addendum)
Abdominal aortogram scheduled at [facility] for: Wednesday September 19, 2023 8:30 AM Arrival time [facility] Main Entrance A at: 6:30 AM  Nothing to eat after midnight prior to procedure, clear liquids until 5 AM day of procedure.  Medication instructions: -Hold:  Warfarin-pt reports none 09/15/23 until post procedure  Patient reports she started taking aspirin 81 mg daily 09/15/23 and has been taking daily since then. -Other usual morning medications can be taken with sips of water including aspirin 81 mg.  Plan to go home the same day, you will only stay overnight if medically necessary.  You must have responsible adult to drive you home.  Someone must be with you the first 24 hours after you arrive home.  Reviewed procedure instructions with patient.  Patient reports she feels well, currently no covid symptoms.

## 2023-09-19 ENCOUNTER — Other Ambulatory Visit: Payer: Self-pay

## 2023-09-19 ENCOUNTER — Encounter (HOSPITAL_COMMUNITY)
Admission: RE | Disposition: A | Discharge status: Home / Self Care | Payer: Self-pay | Source: Home / Self Care | Attending: Cardiovascular Disease

## 2023-09-19 ENCOUNTER — Ambulatory Visit (HOSPITAL_COMMUNITY)
Admission: RE | Admit: 2023-09-19 | Discharge: 2023-09-19 | Disposition: A | Discharge status: Home / Self Care | Payer: Medicare Other | Attending: Cardiovascular Disease | Admitting: Cardiovascular Disease

## 2023-09-19 DIAGNOSIS — Z7901 Long term (current) use of anticoagulants: Secondary | ICD-10-CM | POA: Insufficient documentation

## 2023-09-19 DIAGNOSIS — J449 Chronic obstructive pulmonary disease, unspecified: Secondary | ICD-10-CM | POA: Diagnosis not present

## 2023-09-19 DIAGNOSIS — C9110 Chronic lymphocytic leukemia of B-cell type not having achieved remission: Secondary | ICD-10-CM | POA: Diagnosis not present

## 2023-09-19 DIAGNOSIS — I739 Peripheral vascular disease, unspecified: Secondary | ICD-10-CM

## 2023-09-19 DIAGNOSIS — E782 Mixed hyperlipidemia: Secondary | ICD-10-CM | POA: Insufficient documentation

## 2023-09-19 DIAGNOSIS — I2510 Atherosclerotic heart disease of native coronary artery without angina pectoris: Secondary | ICD-10-CM | POA: Insufficient documentation

## 2023-09-19 DIAGNOSIS — I7300 Raynaud's syndrome without gangrene: Secondary | ICD-10-CM | POA: Insufficient documentation

## 2023-09-19 DIAGNOSIS — I70212 Atherosclerosis of native arteries of extremities with intermittent claudication, left leg: Secondary | ICD-10-CM | POA: Insufficient documentation

## 2023-09-19 DIAGNOSIS — I701 Atherosclerosis of renal artery: Secondary | ICD-10-CM | POA: Diagnosis not present

## 2023-09-19 DIAGNOSIS — Z85118 Personal history of other malignant neoplasm of bronchus and lung: Secondary | ICD-10-CM | POA: Insufficient documentation

## 2023-09-19 DIAGNOSIS — I480 Paroxysmal atrial fibrillation: Secondary | ICD-10-CM | POA: Diagnosis not present

## 2023-09-19 DIAGNOSIS — Z951 Presence of aortocoronary bypass graft: Secondary | ICD-10-CM | POA: Insufficient documentation

## 2023-09-19 DIAGNOSIS — Z87891 Personal history of nicotine dependence: Secondary | ICD-10-CM | POA: Diagnosis not present

## 2023-09-19 DIAGNOSIS — C3490 Malignant neoplasm of unspecified part of unspecified bronchus or lung: Secondary | ICD-10-CM

## 2023-09-19 DIAGNOSIS — Z79899 Other long term (current) drug therapy: Secondary | ICD-10-CM | POA: Diagnosis not present

## 2023-09-19 DIAGNOSIS — I10 Essential (primary) hypertension: Secondary | ICD-10-CM | POA: Diagnosis not present

## 2023-09-19 HISTORY — PX: ABDOMINAL AORTOGRAM W/LOWER EXTREMITY: CATH118223

## 2023-09-19 LAB — CBC
HCT: 39.2 % (ref 36.0–46.0)
Hemoglobin: 12.9 g/dL (ref 12.0–15.0)
MCH: 31.3 pg (ref 26.0–34.0)
MCHC: 32.9 g/dL (ref 30.0–36.0)
MCV: 95.1 fL (ref 80.0–100.0)
Platelets: 143 10*3/uL — ABNORMAL LOW (ref 150–400)
RBC: 4.12 MIL/uL (ref 3.87–5.11)
RDW: 13.3 % (ref 11.5–15.5)
WBC: 17.2 10*3/uL — ABNORMAL HIGH (ref 4.0–10.5)
nRBC: 0.1 % (ref 0.0–0.2)

## 2023-09-19 LAB — PROTIME-INR
INR: 1.4 — ABNORMAL HIGH (ref 0.8–1.2)
Prothrombin Time: 17.5 s — ABNORMAL HIGH (ref 11.4–15.2)

## 2023-09-19 SURGERY — ABDOMINAL AORTOGRAM W/LOWER EXTREMITY
Anesthesia: LOCAL

## 2023-09-19 MED ORDER — SODIUM CHLORIDE 0.9% FLUSH: 3.0000 mL | Freq: Two times a day (BID) | INTRAVENOUS | Status: DC

## 2023-09-19 MED ORDER — IODIXANOL 320 MG/ML IV SOLN
INTRAVENOUS | Status: DC | PRN
  Administered 2023-09-19: 80 mL via INTRA_ARTERIAL

## 2023-09-19 MED ORDER — SODIUM CHLORIDE 0.9 % IV SOLN: 250.0000 mL | INTRAVENOUS | Status: DC | PRN

## 2023-09-19 MED ORDER — SODIUM CHLORIDE 0.9 % IV SOLN: INTRAVENOUS | Status: AC

## 2023-09-19 MED ORDER — MIDAZOLAM HCL 2 MG/2ML IJ SOLN
INTRAMUSCULAR | Status: DC | PRN
  Administered 2023-09-19: 1 mg via INTRAVENOUS

## 2023-09-19 MED ORDER — SODIUM CHLORIDE 0.9 % WEIGHT BASED INFUSION
3.0000 mL/kg/h | INTRAVENOUS | Status: AC
  Administered 2023-09-19: 3 mL/kg/h via INTRAVENOUS

## 2023-09-19 MED ORDER — ASPIRIN 81 MG PO CHEW
81.0000 mg | CHEWABLE_TABLET | ORAL | Status: DC
Start: 2023-09-19 — End: 2023-09-19

## 2023-09-19 MED ORDER — LIDOCAINE HCL (PF) 1 % IJ SOLN
INTRAMUSCULAR | Status: DC | PRN
  Administered 2023-09-19: 15 mL

## 2023-09-19 MED ORDER — ONDANSETRON HCL 4 MG/2ML IJ SOLN: 4.0000 mg | Freq: Four times a day (QID) | INTRAMUSCULAR | Status: DC | PRN

## 2023-09-19 MED ORDER — FENTANYL CITRATE (PF) 100 MCG/2ML IJ SOLN
INTRAMUSCULAR | Status: DC | PRN
  Administered 2023-09-19 (×2): 25 ug via INTRAVENOUS

## 2023-09-19 MED ORDER — MIDAZOLAM HCL 2 MG/2ML IJ SOLN
INTRAMUSCULAR | Status: AC
  Filled 2023-09-19: qty 2

## 2023-09-19 MED ORDER — SODIUM CHLORIDE 0.9 % WEIGHT BASED INFUSION: 1.0000 mL/kg/h | INTRAVENOUS | Status: DC

## 2023-09-19 MED ORDER — ACETAMINOPHEN 325 MG PO TABS
650.0000 mg | ORAL_TABLET | ORAL | Status: DC | PRN
Start: 2023-09-19 — End: 2023-09-19

## 2023-09-19 MED ORDER — LIDOCAINE HCL (PF) 1 % IJ SOLN
INTRAMUSCULAR | Status: AC
  Filled 2023-09-19: qty 30

## 2023-09-19 MED ORDER — HEPARIN (PORCINE) IN NACL 1000-0.9 UT/500ML-% IV SOLN
INTRAVENOUS | Status: DC | PRN
  Administered 2023-09-19: 1000 mL

## 2023-09-19 MED ORDER — FENTANYL CITRATE (PF) 100 MCG/2ML IJ SOLN
INTRAMUSCULAR | Status: AC
  Filled 2023-09-19: qty 2

## 2023-09-19 MED ORDER — SODIUM CHLORIDE 0.9% FLUSH: 3.0000 mL | INTRAVENOUS | Status: DC | PRN

## 2023-09-19 SURGICAL SUPPLY — 11 items
CATH ANGIO 5F BER2 65CM (CATHETERS) IMPLANT
CATH ANGIO 5F PIGTAIL 65CM (CATHETERS) IMPLANT
CATH CROSS OVER TEMPO 5F (CATHETERS) IMPLANT
CATH STRAIGHT 5FR 65CM (CATHETERS) IMPLANT
KIT MICROPUNCTURE NIT STIFF (SHEATH) IMPLANT
KIT SYRINGE INJ CVI SPIKEX1 (MISCELLANEOUS) IMPLANT
SET ATX-X65L (MISCELLANEOUS) IMPLANT
SHEATH PINNACLE 5F 10CM (SHEATH) IMPLANT
SHEATH PROBE COVER 6X72 (BAG) IMPLANT
TRAY PV CATH (CUSTOM PROCEDURE TRAY) ×1 IMPLANT
WIRE HITORQ VERSACORE ST 145CM (WIRE) IMPLANT

## 2023-09-19 NOTE — Progress Notes (Signed)
Lab called stating insufficient amount of blood in pt/INR tube. Ardine Eng / cath labwas notified.

## 2023-09-19 NOTE — Progress Notes (Signed)
Up and walked and tolerated well; right groin stable, no bleeding or hematoma 

## 2023-09-19 NOTE — Progress Notes (Signed)
Site Area: Right femoral  Site prior to removal: level 0  Pressure applied for 30 minutes  Hematoma formed from time of leaving room to arriving in holding area. Manual pressure applied for 10 minutes. Area returned to a 0.  Patient tolerated well  Post pull site level 0  Post pull instructions given.  Post pull pulses present: right DP and PT, left PT only  Dressing (tegaderm and gauze) applied.  Bedrest begins @ 10:45

## 2023-09-19 NOTE — Interval H&P Note (Signed)
History and Physical Interval Note:  09/19/2023 9:24 AM  [name]  has presented today for surgery, with the diagnosis of pad.  The various methods of treatment have been discussed with the patient and family. After consideration of risks, benefits and other options for treatment, the patient has consented to  Procedure(s): ABDOMINAL AORTOGRAM W/LOWER EXTREMITY (N/A) as a surgical intervention.  The patient's history has been reviewed, patient examined, no change in status, stable for surgery.  I have reviewed the patient's chart and labs.  Questions were answered to the patient's satisfaction.     Lorine Bears

## 2023-09-20 ENCOUNTER — Encounter (HOSPITAL_COMMUNITY): Payer: Self-pay | Admitting: Cardiovascular Disease

## 2023-09-21 ENCOUNTER — Encounter (HOSPITAL_COMMUNITY): Payer: Self-pay

## 2023-09-21 ENCOUNTER — Emergency Department (HOSPITAL_COMMUNITY): Payer: Medicare Other

## 2023-09-21 ENCOUNTER — Emergency Department (HOSPITAL_COMMUNITY)
Admission: EM | Admit: 2023-09-21 | Discharge: 2023-09-21 | Disposition: A | Discharge status: Home / Self Care | Payer: Medicare Other | Attending: Emergency Medicine | Admitting: Emergency Medicine

## 2023-09-21 ENCOUNTER — Ambulatory Visit (HOSPITAL_COMMUNITY): Payer: Self-pay

## 2023-09-21 ENCOUNTER — Telehealth: Payer: Self-pay | Admitting: Cardiovascular Disease

## 2023-09-21 ENCOUNTER — Other Ambulatory Visit: Payer: Self-pay

## 2023-09-21 DIAGNOSIS — I10 Essential (primary) hypertension: Secondary | ICD-10-CM | POA: Diagnosis not present

## 2023-09-21 DIAGNOSIS — Z91040 Latex allergy status: Secondary | ICD-10-CM | POA: Diagnosis not present

## 2023-09-21 DIAGNOSIS — X58XXXA Exposure to other specified factors, initial encounter: Secondary | ICD-10-CM | POA: Insufficient documentation

## 2023-09-21 DIAGNOSIS — R1903 Right lower quadrant abdominal swelling, mass and lump: Secondary | ICD-10-CM | POA: Diagnosis present

## 2023-09-21 DIAGNOSIS — I2510 Atherosclerotic heart disease of native coronary artery without angina pectoris: Secondary | ICD-10-CM | POA: Diagnosis not present

## 2023-09-21 DIAGNOSIS — G8918 Other acute postprocedural pain: Secondary | ICD-10-CM | POA: Insufficient documentation

## 2023-09-21 DIAGNOSIS — Z951 Presence of aortocoronary bypass graft: Secondary | ICD-10-CM | POA: Insufficient documentation

## 2023-09-21 DIAGNOSIS — D72829 Elevated white blood cell count, unspecified: Secondary | ICD-10-CM | POA: Insufficient documentation

## 2023-09-21 DIAGNOSIS — S301XXA Contusion of abdominal wall, initial encounter: Secondary | ICD-10-CM | POA: Insufficient documentation

## 2023-09-21 DIAGNOSIS — Z85828 Personal history of other malignant neoplasm of skin: Secondary | ICD-10-CM | POA: Insufficient documentation

## 2023-09-21 DIAGNOSIS — R109 Unspecified abdominal pain: Secondary | ICD-10-CM | POA: Diagnosis not present

## 2023-09-21 DIAGNOSIS — J449 Chronic obstructive pulmonary disease, unspecified: Secondary | ICD-10-CM | POA: Diagnosis not present

## 2023-09-21 DIAGNOSIS — Z85118 Personal history of other malignant neoplasm of bronchus and lung: Secondary | ICD-10-CM | POA: Diagnosis not present

## 2023-09-21 DIAGNOSIS — R1030 Lower abdominal pain, unspecified: Secondary | ICD-10-CM | POA: Diagnosis not present

## 2023-09-21 LAB — COMPREHENSIVE METABOLIC PANEL
ALT: 22 U/L (ref 0–44)
AST: 25 U/L (ref 15–41)
Albumin: 3.9 g/dL (ref 3.5–5.0)
Alkaline Phosphatase: 38 U/L (ref 38–126)
Anion gap: 8 (ref 5–15)
BUN: 11 mg/dL (ref 8–23)
CO2: 27 mmol/L (ref 22–32)
Calcium: 9.5 mg/dL (ref 8.9–10.3)
Chloride: 99 mmol/L (ref 98–111)
Creatinine, Ser: 0.65 mg/dL (ref 0.44–1.00)
GFR, Estimated: >60 mL/min (ref >60)
Glucose, Bld: 96 mg/dL (ref 70–99)
Potassium: 4.7 mmol/L (ref 3.5–5.1)
Sodium: 134 mmol/L — ABNORMAL LOW (ref 135–145)
Total Bilirubin: 0.7 mg/dL (ref <1.2)
Total Protein: 6 g/dL — ABNORMAL LOW (ref 6.5–8.1)

## 2023-09-21 LAB — PROTIME-INR
INR: 1.1 (ref 0.8–1.2)
Prothrombin Time: 14.4 s (ref 11.4–15.2)

## 2023-09-21 LAB — CBC WITH DIFFERENTIAL/PLATELET
Abs Immature Granulocytes: 0 10*3/uL (ref 0.00–0.07)
Basophils Absolute: 0 10*3/uL (ref 0.0–0.1)
Basophils Relative: 0 %
Eosinophils Absolute: 0.2 10*3/uL (ref 0.0–0.5)
Eosinophils Relative: 1 %
HCT: 36.5 % (ref 36.0–46.0)
Hemoglobin: 12 g/dL (ref 12.0–15.0)
Lymphocytes Relative: 34 %
Lymphs Abs: 6.1 10*3/uL — ABNORMAL HIGH (ref 0.7–4.0)
MCH: 31.4 pg (ref 26.0–34.0)
MCHC: 32.9 g/dL (ref 30.0–36.0)
MCV: 95.5 fL (ref 80.0–100.0)
Monocytes Absolute: 0.7 10*3/uL (ref 0.1–1.0)
Monocytes Relative: 4 %
Neutro Abs: 11 10*3/uL — ABNORMAL HIGH (ref 1.7–7.7)
Neutrophils Relative %: 61 %
Platelets: 164 10*3/uL (ref 150–400)
RBC: 3.82 MIL/uL — ABNORMAL LOW (ref 3.87–5.11)
RDW: 13.3 % (ref 11.5–15.5)
Smear Review: NORMAL
WBC: 18 10*3/uL — ABNORMAL HIGH (ref 4.0–10.5)
nRBC: 0 % (ref 0.0–0.2)

## 2023-09-21 LAB — APTT: aPTT: 29 s (ref 24–36)

## 2023-09-21 MED ORDER — IOHEXOL 350 MG/ML SOLN
75.0000 mL | Freq: Once | INTRAVENOUS | Status: AC | PRN
Start: 2023-09-21 — End: 2023-09-21
  Administered 2023-09-21: 75 mL via INTRAVENOUS

## 2023-09-21 NOTE — ED Triage Notes (Signed)
Pt had arteriogram in right groin a couple of days ago. Since then pt has had increased swelling. Pt c/o weakness since procedure.

## 2023-09-21 NOTE — Telephone Encounter (Signed)
Attempted to reach the patient. She is currently in the ED

## 2023-09-21 NOTE — ED Provider Triage Note (Signed)
Emergency Medicine Provider Triage Evaluation Note  [name] , a 77 y.o. female  was evaluated in triage.  Pt who recent underwent abdominal aortogram placement on 09/19/23 on Warfarin presents with concern for more swelling and bruising over the incision site. Denies any chest pain, shortness of breath, or dizziness. Reports feeling more fatigued. Reports history of retroperitoneal bleed  Review of Systems  Positive: As above Negative: As above  Physical Exam  BP 128/75 (BP Location: Right Arm)   Pulse 65   Temp 98.1 F (36.7 C) (Oral)   Resp 17   SpO2 99%  Gen:   Awake, no distress   Resp:  Normal effort  MSK:   Moves extremities without difficulty  Other:  Significant ecchymoses across lower abdomen   Medical Decision Making  Medically screening exam initiated at 1:04 PM.  Appropriate orders placed.  [name] was informed that the remainder of the evaluation will be completed by another provider, this initial triage assessment does not replace that evaluation, and the importance of remaining in the ED until their evaluation is complete.     Arabella Merles, PA-C 09/21/23 1305

## 2023-09-21 NOTE — ED Notes (Signed)
Patient updated on plan of care.  No complaints voiced at this time.  Respirations even and unlabored.

## 2023-09-21 NOTE — ED Notes (Signed)
Patient provided with ice packs to use at home

## 2023-09-21 NOTE — Discharge Instructions (Signed)
Your CT scan showed hematoma in the right lower abdomen.  No active bleeding.  Your hemoglobin and vital signs were stable.  No signs of significant bleeding or blood loss.  I spoke to cardiology regarding your CT finding.  They recommended holding Coumadin and resuming 81 mg aspirin.  Ice the affected area about 10 to 15 minutes 4-5 times a day.  Call the cardiology clinic Monday morning to schedule a follow-up appointment.  If you have any concerning signs such as worsening swelling, severe pain, worsening lightheadedness, significant exertional dyspnea, or profound weakness please return to the emergency department.
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
## 2023-09-21 NOTE — Telephone Encounter (Signed)
Pt c/o swelling: STAT is pt has developed SOB within 24 hours  How much weight have you gained and in what time span?   If swelling, where is the swelling located? Swelling above incision site from procedure    Are you currently taking a fluid pill?   Are you currently SOB? No   Do you have a log of your daily weights (if so, list)?   Have you gained 3 pounds in a day or 5 pounds in a week?   Have you traveled recently? No   Patient had recent procedure 11/06 and states that she is having swelling above incision. States she would like to go to ED, but would like to see if she can have assistance with the wait time. Please advise.

## 2023-09-24 NOTE — Telephone Encounter (Signed)
Called patient, after speaking with primary RN, Azalee Course, PA had PV opening tomorrow at 9:15 AM. Patient verbalized understanding. Would like this spot, Dr.Arida is also in office at [facility] tomorrow during this time as well.   Thanks!

## 2023-09-24 NOTE — Telephone Encounter (Signed)
Patient is calling back, she went to the ER Friday but still in pain. She is looking to get seen to today, advise patient first available is Thursday. She ask that the nurse gives her a call back. Please advise

## 2023-09-25 ENCOUNTER — Other Ambulatory Visit: Payer: Self-pay | Admitting: Pulmonary Disease

## 2023-09-25 ENCOUNTER — Telehealth: Payer: Self-pay

## 2023-09-25 ENCOUNTER — Encounter: Payer: Self-pay | Admitting: Physician Assistant

## 2023-09-25 ENCOUNTER — Ambulatory Visit: Payer: Medicare Other | Attending: Physician Assistant | Admitting: Physician Assistant

## 2023-09-25 VITALS — BP 121/67 | HR 64 | Ht 63.0 in | Wt 156.0 lb

## 2023-09-25 DIAGNOSIS — T148XXD Other injury of unspecified body region, subsequent encounter: Secondary | ICD-10-CM

## 2023-09-25 DIAGNOSIS — I25810 Atherosclerosis of coronary artery bypass graft(s) without angina pectoris: Secondary | ICD-10-CM | POA: Diagnosis not present

## 2023-09-25 DIAGNOSIS — T148XXA Other injury of unspecified body region, initial encounter: Secondary | ICD-10-CM | POA: Insufficient documentation

## 2023-09-25 DIAGNOSIS — I480 Paroxysmal atrial fibrillation: Secondary | ICD-10-CM | POA: Diagnosis not present

## 2023-09-25 DIAGNOSIS — C9110 Chronic lymphocytic leukemia of B-cell type not having achieved remission: Secondary | ICD-10-CM | POA: Diagnosis not present

## 2023-09-25 NOTE — Patient Instructions (Addendum)
Medication Instructions:  NO CHANGES *If you need a refill on your cardiac medications before your next appointment, please call your pharmacy*   Lab Work: NO LABS If you have labs (blood work) drawn today and your tests are completely normal, you will receive your results only by: MyChart Message (if you have MyChart) OR A paper copy in the mail If you have any lab test that is abnormal or we need to change your treatment, we will call you to review the results.   Testing/Procedures: NO TESTING   Follow-Up: At [facility], you and your health needs are our priority.  As part of our continuing mission to provide you with exceptional heart care, we have created designated Provider Care Teams.  These Care Teams include your primary Cardiologist (physician) and Advanced Practice Providers (APPs -  Physician Assistants and Nurse Practitioners) who all work together to provide you with the care you need, when you need it.    Your next appointment:   KEEP UPCOMING FOLLOW UP APPOINTMENTS.  Other Instructions COUMADIN WILL CALL TO RESCHEDULE APPOINTMENT FOR A DAY NEXT WEEK.

## 2023-09-25 NOTE — Progress Notes (Signed)
Cardiology Office Note:  .   Date:  09/25/2023  ID:  Sherray, Swickard [birth year], MRN [id number] PCP: Jerl Mina, MD  [facility] Providers Cardiologist:  Meriam Sprague, MD (Inactive) Electrophysiologist:  Sherryl Manges, MD  PV Cardiologist:  Lorine Bears, MD    History of Present Illness: .   [name] is a 77 y.o. female with PMH of CAD s/p CABG 2008, A-fib/atrial flutter, Sjogren's disease, PAD, COPD, CLL under lung cancer s/p resection.  Patient has been followed by Dr. Elberta Fortis of EP service for her A-fib and underwent ablation in March 2023.  She has a history of high-grade calcified bilateral iliac disease for which she underwent orbital arthrectomy, PTA and covered stenting using kissing technique in both iliac arteries in 2021.  Postprocedure, she had a retroperitoneal bleed.  Myoview in May 2022 was normal.  She had a history of lung cancer status post VATS with lobectomy.  She was diagnosed with CLL in October 2022.  She had a CTA of the abdomen pelvis in March 2023 that showed patent common iliac artery stents with short segment of dissection in the proximal right external iliac artery as well as possible significant stenosis affecting the celiac trunk and left renal artery.  She was seen by Dr. Karin Lieu of vascular surgery for a second opinion who recommended conservative management.  Echocardiogram in November 2023 showed EF 65 to 70%, mildly reduced RVEF, normal PASP, trivial MR.  She was recently seen by Dr. Elberta Fortis in July 2024 at which time she was doing well without recurrence of atrial fibrillation or atrial flutter.  More recently, patient was seen by Dr. Kirke Corin on 08/28/2023 for left lower extremity claudication symptom.  He was suspected patient likely has SFA or popliteal disease causing her symptom.  Abdominal aortogram with lower extremity angiography was recommended.  ABI obtained on 09/03/2023 showed right ABI 0.95, left ABI has significantly decreased  to 0.49.  Lower extremity angiography was performed on 09/19/2023.  This showed patent common left iliac artery stent with no significant restenosis, severely calcified common femoral artery stenosis extending into the ostium of the profunda and a severely calcified mid to distal SFA stenosis with three-vessel runoff.  Right lower extremity angiography showed common patent iliac artery stent with no significant restenosis, moderate external iliac artery, moderate calcified SFA disease in the three-vessel runoff.  The left lower extremity disease likely will need left common femoral artery enterectomy and left SFA orbital arthrectomy in the hybrid OR.  Dr. Myra Gianotti was consulted for the upcoming procedure.  Warfarin was resumed after the procedure.  Since the procedure, she did return to the ED 2 days later on 09/21/2019 for due to swelling in the right groin after the recent arteriogram.  CT of abdomen and pelvis with contrast obtained, there was no evidence of retroperitoneal bleed or pseudoaneurysm.  It does show a hematoma and contusion.  Conservative management recommended.  Patient was at urgent add-on today for evaluation of right groin pain.  She is at her groin pain has been gradually worsening since her procedure.  Patient was seen and examined with chaperone.  She was covered and draped before she was examined. On physical exam, she has significant bruising across her entire lower abdomen above her pubic area.  The bruising above her right side is significantly tender, however there was no pain with palpation over the left lower abdominal bruising.  I reviewed the CT report with Dr. Kirke Corin who also saw the  patient.  Currently, there is low suspicion for retroperitoneal bleed or active extravasation.  We recommended continue observation.  She has been holding Coumadin since 4 days prior to her procedure, Dr. Kirke Corin is okay with her stopping the aspirin and resume Coumadin at this time.  I discussed her case  with Casimiro Needle our Coumadin clinic nurse who recommended push her tomorrow's coumadin appointment to next week.   ROS:   She denies chest pain, palpitations, dyspnea, pnd, orthopnea, n, v, dizziness, syncope, edema, weight gain, or early satiety. All other systems reviewed and are otherwise negative except as noted above.    Studies Reviewed: .        Cardiac Studies & Procedures     STRESS TESTS  MYOCARDIAL PERFUSION IMAGING 02/04/2021  Narrative  The left ventricular ejection fraction is normal (55-65%).  Nuclear stress EF: 61%.  There was no ST segment deviation noted during stress.  The study is normal.  This is a low risk study.   ECHOCARDIOGRAM  ECHOCARDIOGRAM COMPLETE 04/05/2023  Narrative ECHOCARDIOGRAM REPORT    Patient Name:   [name] Date of Exam: 04/05/2023 Medical Rec #:  [id number]       Height:       63.0 in Accession #:    [id number]      Weight:       158.2 lb Date of Birth:  25-May-1946       BSA:          1.750 m Patient Age:    20 years        BP:           110/58 mmHg Patient Gender: F               HR:           71 bpm. Exam Location:  [address]  Procedure: 2D Echo, 3D Echo, Cardiac Doppler and Color Doppler  Indications:    R06.00 Dyspnea  History:        Patient has prior history of Echocardiogram examinations, most recent 09/27/2022. CAD and Previous Myocardial Infarction, Prior CABG, Pulmonary HTN, COPD and PAD, Arrythmias:Atrial Fibrillation and Atrial Flutter, Signs/Symptoms:Dyspnea and Edema; Risk Factors:Family History of Coronary Artery Disease, Hypertension, Dyslipidemia and Former Smoker. Lung Cancer status post Left Lower Lobectomy,.  Sonographer:    Farrel Conners RDCS Referring Phys: Karren Burly  IMPRESSIONS   1. Left ventricular ejection fraction, by estimation, is 65 to 70%. The left ventricle has normal function. The left ventricle has no regional wall motion abnormalities. There is mild asymmetric  left ventricular hypertrophy of the basal-septal segment. Left ventricular diastolic parameters are indeterminate. 2. Right ventricular systolic function is normal. The right ventricular size is normal. There is mildly elevated pulmonary artery systolic pressure. The estimated right ventricular systolic pressure is 43.7 mmHg. 3. Left atrial size was moderately dilated. 4. The mitral valve is degenerative. Mild mitral valve regurgitation. Moderate mitral annular calcification. 5. The aortic valve is tricuspid. There is moderate calcification of the aortic valve. There is moderate thickening of the aortic valve. Aortic valve regurgitation is trivial. Aortic valve sclerosis/calcification is present, without any evidence of aortic stenosis. 6. The inferior vena cava is normal in size with greater than 50% respiratory variability, suggesting right atrial pressure of 3 mmHg.  Comparison(s): No significant change from prior study.  FINDINGS Left Ventricle: Left ventricular ejection fraction, by estimation, is 65 to 70%. The left ventricle has normal function. The left ventricle has no  regional wall motion abnormalities. 3D ejection fraction reviewed and evaluated as part of the interpretation. Alternate measurement of EF is felt to be most reflective of LV function. The left ventricular internal cavity size was normal in size. There is mild asymmetric left ventricular hypertrophy of the basal-septal segment. Left ventricular diastolic parameters are indeterminate.  Right Ventricle: The right ventricular size is normal. No increase in right ventricular wall thickness. Right ventricular systolic function is normal. There is mildly elevated pulmonary artery systolic pressure. The tricuspid regurgitant velocity is 3.19 m/s, and with an assumed right atrial pressure of 3 mmHg, the estimated right ventricular systolic pressure is 43.7 mmHg.  Left Atrium: Left atrial size was moderately dilated.  Right  Atrium: Right atrial size was normal in size.  Pericardium: There is no evidence of pericardial effusion.  Mitral Valve: The mitral valve is degenerative in appearance. There is mild thickening of the mitral valve leaflet(s). There is mild calcification of the mitral valve leaflet(s). Moderate mitral annular calcification. Mild mitral valve regurgitation.  Tricuspid Valve: The tricuspid valve is normal in structure. Tricuspid valve regurgitation is mild.  Aortic Valve: The aortic valve is tricuspid. There is moderate calcification of the aortic valve. There is moderate thickening of the aortic valve. Aortic valve regurgitation is trivial. Aortic valve sclerosis/calcification is present, without any evidence of aortic stenosis.  Pulmonic Valve: The pulmonic valve was normal in structure. Pulmonic valve regurgitation is trivial.  Aorta: The aortic root and ascending aorta are structurally normal, with no evidence of dilitation.  Venous: The inferior vena cava is normal in size with greater than 50% respiratory variability, suggesting right atrial pressure of 3 mmHg.  IAS/Shunts: The interatrial septum is aneurysmal. The atrial septum is grossly normal.   LEFT VENTRICLE PLAX 2D LVIDd:         4.17 cm   Diastology LVIDs:         2.68 cm   LV e' medial:    10.00 cm/s LV PW:         0.93 cm   LV E/e' medial:  8.4 LV IVS:        1.03 cm   LV e' lateral:   16.10 cm/s LVOT diam:     1.90 cm   LV E/e' lateral: 5.2 LV SV:         55 LV SV Index:   32 LVOT Area:     2.84 cm  3D Volume EF: 3D EF:        67 % LV EDV:       92 ml LV ESV:       30 ml LV SV:        62 ml  RIGHT VENTRICLE RV Basal diam:  3.90 cm RV Mid diam:    2.90 cm TAPSE (M-mode): 1.8 cm RVSP:           43.7 mmHg  LEFT ATRIUM             Index        RIGHT ATRIUM           Index LA diam:        4.87 cm 2.78 cm/m   RA Pressure: 3.00 mmHg LA Vol (A2C):   59.0 ml 33.71 ml/m  RA Area:     16.80 cm LA Vol (A4C):   68.5  ml 39.14 ml/m  RA Volume:   45.10 ml  25.77 ml/m LA Biplane Vol: 67.0 ml 38.28 ml/m AORTIC VALVE LVOT  Vmax:   82.00 cm/s LVOT Vmean:  54.750 cm/s LVOT VTI:    0.195 m  AORTA Ao Root diam: 2.60 cm Ao Asc diam:  3.20 cm  MITRAL VALVE               TRICUSPID VALVE MV Area (PHT)  cm         TR Peak grad:   40.7 mmHg MV Decel Time: 166 msec    TR Vmax:        319.00 cm/s MV E velocity: 83.83 cm/s  Estimated RAP:  3.00 mmHg MV A velocity: 57.83 cm/s  RVSP:           43.7 mmHg MV E/A ratio:  1.45 SHUNTS Systemic VTI:  0.20 m Systemic Diam: 1.90 cm  Laurance Flatten MD Electronically signed by Laurance Flatten MD Signature Date/Time: 04/05/2023/2:04:08 PM    Final             Risk Assessment/Calculations:    CHA2DS2-VASc Score = 5   This indicates a 7.2% annual risk of stroke. The patient's score is based upon: CHF History: 0 HTN History: 1 Diabetes History: 0 Stroke History: 0 Vascular Disease History: 1 Age Score: 2 Gender Score: 1            Physical Exam:   VS:  BP 121/67 (BP Location: Left Arm, Patient Position: Sitting, Cuff Size: Normal)   Pulse 64   Ht 5\' 3"  (1.6 m)   Wt 156 lb (70.8 kg)   SpO2 99%   BMI 27.63 kg/m    Wt Readings from Last 3 Encounters:  09/25/23 156 lb (70.8 kg)  09/21/23 153 lb (69.4 kg)  09/19/23 153 lb (69.4 kg)    GEN: Well nourished, well developed in no acute distress NECK: No JVD; No carotid bruits CARDIAC: RRR, no murmurs, rubs, gallops RESPIRATORY:  Clear to auscultation without rales, wheezing or rhonchi  ABDOMEN: Soft, non-tender, non-distended EXTREMITIES:  No edema; No deformity   ASSESSMENT AND PLAN: .    Post-procedural groin hematoma Pain and swelling in the right groin following arteriogram. CT of abdomen and pelvis showed no evidence of retroperitoneal bleed or pseudoaneurysm, but did show hematoma and contusion. Pain has been worsening since the procedure. -Conservative management recommended.  Seen and  examined along with Dr. Kirke Corin, we recommended continue observation. Suspect her hematoma will improve over the next few weeks -Resume Warfarin and discontinue Aspirin. -Patient is aware to continue observation and seek medical attention if her symptom worsens  Peripheral Arterial Disease (PAD) Severe disease in the left femoral artery causing claudication symptoms and nocturnal leg pain. Recent angiography showed left iliac artery stent with no significant in-stent stenosis, severely calcified femoral artery stenosis extending into the ostium of the profunda, and severely calcified mid to distal SFA stenosis with three vessel runoff. Right lower extremity angiography showed common and external iliac artery stent with no significant in-stent stenosis, moderate external iliac artery stenosis, moderate calcified SFA disease, and three vessel runoff. -Left common femoral artery endarterectomy and left SFA orbital atherectomy planned. -Follow-up with vascular surgeon and EP service as scheduled.  PAF -History of ablation by Dr. Elberta Fortis in March 2023, no significant recurrence since Patient has been off Coumadin since prior to the procedure on 09/19/2023 and has been taking Aspirin. -Resume Coumadin and discontinue Aspirin. -Check INR at Coumadin clinic visit.  CAD s/p CABG 2008: No significant chest pain  CLL: Followed by oncology/hematology service       Dispo: follow  up with Dr. Myra Gianotti and Dr. Kirke Corin  Signed, Azalee Course, [place]

## 2023-09-25 NOTE — Telephone Encounter (Signed)
Lpmtcb to reschedule INR check for [facility] 11/20

## 2023-09-26 ENCOUNTER — Ambulatory Visit: Payer: Medicare Other

## 2023-10-01 ENCOUNTER — Encounter: Payer: Self-pay | Admitting: Surgery

## 2023-10-01 ENCOUNTER — Ambulatory Visit (INDEPENDENT_AMBULATORY_CARE_PROVIDER_SITE_OTHER): Payer: Medicare Other | Admitting: Surgery

## 2023-10-01 VITALS — BP 144/74 | HR 64 | Temp 97.8°F | Resp 16 | Ht 63.0 in | Wt 155.0 lb

## 2023-10-01 DIAGNOSIS — I70213 Atherosclerosis of native arteries of extremities with intermittent claudication, bilateral legs: Secondary | ICD-10-CM
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
## 2023-10-02 ENCOUNTER — Telehealth: Payer: Self-pay

## 2023-10-02 NOTE — Telephone Encounter (Signed)
Pt called with a lot of questions regarding details of MD note from yesterday. We went through it and her past history/surgeries, as she felt there were errors. I did not see any errors on our end and tried to explain to her that she may be seeing an abbreviated version on MyChart than we are seeing. Pt was appreciative and seemed more at ease. She will call back if she has further questions/concerns.

## 2023-10-03 ENCOUNTER — Ambulatory Visit: Payer: Medicare Other | Attending: Cardiovascular Disease

## 2023-10-03 DIAGNOSIS — Z5181 Encounter for therapeutic drug level monitoring: Secondary | ICD-10-CM | POA: Diagnosis not present

## 2023-10-03 DIAGNOSIS — I480 Paroxysmal atrial fibrillation: Secondary | ICD-10-CM | POA: Insufficient documentation

## 2023-10-03 LAB — POCT INR: INR: 1.2 — AB (ref 2.0–3.0)

## 2023-10-03 NOTE — Patient Instructions (Signed)
TAKE 1 TABLET TODAY ONLY THEN  Continue 1 TABLET DAILY, EXCEPT 0.5 TABLET ON MONDAYS AND WEDNESDAYS. Recheck INR in 2 weeks.  Coumadin Clinic [phone]

## 2023-10-04 ENCOUNTER — Telehealth: Payer: Self-pay | Admitting: *Deleted

## 2023-10-04 ENCOUNTER — Other Ambulatory Visit: Payer: Self-pay | Admitting: Cardiovascular Disease

## 2023-10-04 ENCOUNTER — Other Ambulatory Visit: Payer: Self-pay

## 2023-10-04 ENCOUNTER — Telehealth: Payer: Self-pay | Admitting: Podiatry

## 2023-10-04 DIAGNOSIS — Z7901 Long term (current) use of anticoagulants: Secondary | ICD-10-CM

## 2023-10-04 DIAGNOSIS — I70213 Atherosclerosis of native arteries of extremities with intermittent claudication, bilateral legs: Secondary | ICD-10-CM

## 2023-10-04 NOTE — Telephone Encounter (Signed)
   Pre-operative Risk Assessment    Patient Name: [name]  DOB: September 12, 1946 MRN: [id number]   Last OV: Azalee Course, PA 09/25/2023 Upcoming OV: Dr. Kirke Corin 10/09/2023, pre op added to upcoming appt notes.   Request for Surgical Clearance    Procedure:   Left Common Femoral Endarterectomy/left Superficial Femoral Artery Stent Insertion  Date of Surgery:  Clearance 11/28/23                                 Surgeon:  Dr. Durene Cal Surgeon's Group or Practice Name:  [facility] Phone number:  [phone] Fax number:  [phone]   Type of Clearance Requested:   - Medical  - Pharmacy:  Hold Warfarin (Coumadin) 3 days prior.    Type of Anesthesia:  Not Indicated   Additional requests/questions:    Signed, Emmit Pomfret   10/04/2023, 1:27 PM

## 2023-10-04 NOTE — Telephone Encounter (Signed)
Patient called and stated that she would like to get a few recommendations on what kinds of creams she can get over the counter to help with some fungal infection she has, please advise  Thanks!

## 2023-10-09 ENCOUNTER — Encounter: Payer: Self-pay | Admitting: Cardiovascular Disease

## 2023-10-09 ENCOUNTER — Ambulatory Visit: Payer: Medicare Other | Attending: Cardiovascular Disease | Admitting: Cardiovascular Disease

## 2023-10-09 ENCOUNTER — Encounter: Payer: Self-pay | Admitting: Pulmonary Disease

## 2023-10-09 ENCOUNTER — Ambulatory Visit: Payer: Medicare Other | Admitting: Pulmonary Disease

## 2023-10-09 VITALS — BP 120/74 | HR 75 | Ht 63.0 in | Wt 153.0 lb

## 2023-10-09 VITALS — BP 133/72 | HR 68 | Temp 97.7°F | Ht 63.0 in | Wt 152.0 lb

## 2023-10-09 DIAGNOSIS — I25810 Atherosclerosis of coronary artery bypass graft(s) without angina pectoris: Secondary | ICD-10-CM | POA: Diagnosis not present

## 2023-10-09 DIAGNOSIS — I10 Essential (primary) hypertension: Secondary | ICD-10-CM | POA: Insufficient documentation

## 2023-10-09 DIAGNOSIS — I701 Atherosclerosis of renal artery: Secondary | ICD-10-CM | POA: Insufficient documentation

## 2023-10-09 DIAGNOSIS — I480 Paroxysmal atrial fibrillation: Secondary | ICD-10-CM | POA: Insufficient documentation

## 2023-10-09 DIAGNOSIS — K551 Chronic vascular disorders of intestine: Secondary | ICD-10-CM | POA: Diagnosis not present

## 2023-10-09 DIAGNOSIS — R0600 Dyspnea, unspecified: Secondary | ICD-10-CM | POA: Diagnosis not present

## 2023-10-09 DIAGNOSIS — J432 Centrilobular emphysema: Secondary | ICD-10-CM

## 2023-10-09 DIAGNOSIS — I739 Peripheral vascular disease, unspecified: Secondary | ICD-10-CM | POA: Insufficient documentation

## 2023-10-09 DIAGNOSIS — E785 Hyperlipidemia, unspecified: Secondary | ICD-10-CM | POA: Diagnosis not present

## 2023-10-09 NOTE — Patient Instructions (Signed)
Medication Instructions:  Hold the Coumadin 5 days prior to the procedure. During that time, take 81 mg Aspirin once daily  *If you need a refill on your cardiac medications before your next appointment, please call your pharmacy*   Lab Work: None ordered If you have labs (blood work) drawn today and your tests are completely normal, you will receive your results only by: MyChart Message (if you have MyChart) OR A paper copy in the mail If you have any lab test that is abnormal or we need to change your treatment, we will call you to review the results.   Testing/Procedures: None ordered   Follow-Up: At [facility], you and your health needs are our priority.  As part of our continuing mission to provide you with exceptional heart care, we have created designated Provider Care Teams.  These Care Teams include your primary Cardiologist (physician) and Advanced Practice Providers (APPs -  Physician Assistants and Nurse Practitioners) who all work together to provide you with the care you need, when you need it.  We recommend signing up for the patient portal called "MyChart".  Sign up information is provided on this After Visit Summary.  MyChart is used to connect with patients for Virtual Visits (Telemedicine).  Patients are able to view lab/test results, encounter notes, upcoming appointments, etc.  Non-urgent messages can be sent to your provider as well.   To learn more about what you can do with MyChart, go to [email or web address].    Your next appointment:   4 month(s)  Provider:   Dr. Kirke Corin

## 2023-10-09 NOTE — Patient Instructions (Signed)
Continue Anoro  No other inhalers  Return to clinic in 6 months or sooner as needed
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
## 2023-10-10 ENCOUNTER — Encounter: Payer: Self-pay | Admitting: Pulmonary Disease

## 2023-10-10 ENCOUNTER — Encounter: Payer: Self-pay | Admitting: Cardiovascular Disease

## 2023-10-10 DIAGNOSIS — M71371 Other bursal cyst, right ankle and foot: Secondary | ICD-10-CM | POA: Diagnosis not present

## 2023-10-10 DIAGNOSIS — Z08 Encounter for follow-up examination after completed treatment for malignant neoplasm: Secondary | ICD-10-CM | POA: Diagnosis not present

## 2023-10-10 DIAGNOSIS — D485 Neoplasm of uncertain behavior of skin: Secondary | ICD-10-CM | POA: Diagnosis not present

## 2023-10-10 DIAGNOSIS — C44722 Squamous cell carcinoma of skin of right lower limb, including hip: Secondary | ICD-10-CM | POA: Diagnosis not present

## 2023-10-10 DIAGNOSIS — Z85828 Personal history of other malignant neoplasm of skin: Secondary | ICD-10-CM | POA: Diagnosis not present

## 2023-10-13 NOTE — Telephone Encounter (Signed)
Patient with diagnosis of atrial fibrillation on warfarin for anticoagulation.    Procedure:   Left Common Femoral Endarterectomy/left Superficial Femoral Artery Stent Insertion   Date of Surgery:  Clearance 11/28/23    CHA2DS2-VASc Score = 5   This indicates a 7.2% annual risk of stroke. The patient's score is based upon: CHF History: 0 HTN History: 1 Diabetes History: 0 Stroke History: 0 Vascular Disease History: 1 Age Score: 2 Gender Score: 1    CrCl 78 Platelet count 164  Per office protocol, patient can hold warfarin for 5 days prior to procedure.   Patient will not need bridging with Lovenox (enoxaparin) around procedure.  Per chart patient is to switch to Eliquis after surgery.  **This guidance is not considered finalized until pre-operative APP has relayed final recommendations.**

## 2023-10-15 DIAGNOSIS — Z23 Encounter for immunization: Secondary | ICD-10-CM | POA: Diagnosis not present

## 2023-10-15 NOTE — Telephone Encounter (Signed)
   Primary Cardiologist: Meriam Sprague, MD (Inactive)  Chart reviewed as part of pre-operative protocol coverage. Given past medical history and time since last visit, based on ACC/AHA guidelines, [name] would be at acceptable risk for the planned procedure without further cardiovascular testing.   Patient was advised that if she develops new symptoms prior to surgery to contact our office to arrange a follow-up appointment.  He verbalized understanding.  Per office protocol, patient can hold warfarin for 5 days prior to procedure.   Patient will not need bridging with Lovenox (enoxaparin) around procedure. Per chart patient is to switch to Eliquis after surgery.  I will route this recommendation to the requesting party via Epic fax function and remove from pre-op pool.  Please call with questions.  Levi Aland, NP-C  10/15/2023, 11:16 AM [address] Office [phone] Fax [phone]

## 2023-10-17 ENCOUNTER — Telehealth: Payer: Self-pay

## 2023-10-17 ENCOUNTER — Ambulatory Visit: Payer: Medicare Other | Attending: Cardiovascular Disease

## 2023-10-17 DIAGNOSIS — Z5181 Encounter for therapeutic drug level monitoring: Secondary | ICD-10-CM | POA: Diagnosis not present

## 2023-10-17 DIAGNOSIS — I480 Paroxysmal atrial fibrillation: Secondary | ICD-10-CM | POA: Insufficient documentation

## 2023-10-17 LAB — POCT INR: INR: 1.8 — AB (ref 2.0–3.0)

## 2023-10-17 NOTE — Patient Instructions (Signed)
TAKE 1 TABLET TODAY ONLY THEN  Continue 1 TABLET DAILY, EXCEPT 0.5 TABLET ON MONDAYS AND WEDNESDAYS. Recheck INR in 5 weeks.  [facility] [phone]

## 2023-10-17 NOTE — Telephone Encounter (Signed)
Pt called to let MD know she is prone to squamous cell spots and is having one removed on right thigh next week. Stitches will be coming out at the end of Dec. She is scheduled for L COMMON FEM ENDART on January 15th and her surgeon just wanted to make sure that was enough time for MD to proceed with surgery. MD felt this is fine to proceed and pt was made aware. No further questions/concerns at this time.

## 2023-10-18 ENCOUNTER — Encounter: Payer: Self-pay | Admitting: Pharmacist

## 2023-10-18 ENCOUNTER — Encounter: Payer: Self-pay | Admitting: Cardiovascular Disease

## 2023-10-18 DIAGNOSIS — I700 Atherosclerosis of aorta: Secondary | ICD-10-CM

## 2023-10-18 DIAGNOSIS — I739 Peripheral vascular disease, unspecified: Secondary | ICD-10-CM

## 2023-10-18 DIAGNOSIS — I2510 Atherosclerotic heart disease of native coronary artery without angina pectoris: Secondary | ICD-10-CM

## 2023-10-18 DIAGNOSIS — E785 Hyperlipidemia, unspecified: Secondary | ICD-10-CM

## 2023-10-18 NOTE — Addendum Note (Signed)
Addended by: Cheree Ditto on: 10/18/2023 12:28 PM   Modules accepted: Orders

## 2023-10-19 ENCOUNTER — Telehealth: Payer: Self-pay | Admitting: Cardiovascular Disease

## 2023-10-19 NOTE — Telephone Encounter (Signed)
Spoke with pt and advised that per Dr. Kirke Corin, ok to cancel ABI and Aorta/IVC/Iliacs for now.  Pt verbalized understanding.

## 2023-10-19 NOTE — Telephone Encounter (Signed)
Patient wants a call back from RN Misty Stanley regarding if she need to have the tests scheduled on 1/17.

## 2023-10-22 DIAGNOSIS — S70921A Unspecified superficial injury of right thigh, initial encounter: Secondary | ICD-10-CM | POA: Diagnosis not present

## 2023-10-22 DIAGNOSIS — C9110 Chronic lymphocytic leukemia of B-cell type not having achieved remission: Secondary | ICD-10-CM | POA: Diagnosis not present

## 2023-10-24 DIAGNOSIS — C44722 Squamous cell carcinoma of skin of right lower limb, including hip: Secondary | ICD-10-CM | POA: Diagnosis not present

## 2023-10-29 ENCOUNTER — Encounter: Payer: Self-pay | Admitting: Pharmacist

## 2023-10-29 ENCOUNTER — Telehealth: Payer: Self-pay | Admitting: Cardiovascular Disease

## 2023-10-29 ENCOUNTER — Encounter: Payer: Self-pay | Admitting: Cardiovascular Disease

## 2023-10-29 NOTE — Telephone Encounter (Signed)
Patient requests a call back regarding procedure scheduled on 1/17.

## 2023-10-29 NOTE — Telephone Encounter (Signed)
Patient called to ensure procedure for 11/30/23 was cancelled as she received a letter about it on Saturday 10/27/23. Informed patient the procedure for 11/30/23 was indeed cancelled but she has one scheduled 11/28/23 and she was aware of that one.

## 2023-10-29 NOTE — Telephone Encounter (Signed)
Spoke to patient . Patient is aware she will need to get  instruction for Vascular and vein specialist for upcoming surgery 11/28/23  RN Routed message to office.

## 2023-11-02 ENCOUNTER — Ambulatory Visit: Payer: Medicare Other | Admitting: Pulmonary Disease

## 2023-11-13 ENCOUNTER — Encounter (HOSPITAL_COMMUNITY): Payer: Medicare Other

## 2023-11-19 ENCOUNTER — Telehealth: Payer: Self-pay | Admitting: Cardiovascular Disease

## 2023-11-19 NOTE — Telephone Encounter (Signed)
 Pt c/o medication issue:  1. Name of Medication:  Metoprolol   2. How are you currently taking this medication (dosage and times per day)?   3. Are you having a reaction (difficulty breathing--STAT)?   4. What is your medication issue?    Patient would like to speak with Dr. Renato nurse if possible. She states she has been trying to have her records corrected for 1 year regarding Metoprolol  and the correct dose. She mentions that an increased dose was sent to her pharmacy and she would like to have it corrected. Patient will discuss further when she speaks with the nurse.

## 2023-11-19 NOTE — Telephone Encounter (Signed)
 Called and spoke to patient. Verified name and DOB. Patient stated when she went to the pharmacy to pick up a prescription for her Metoprolol  she was given 50 mg instead of the  25 mg. She asked that her chart be updated to 25 mg. She was informed that we did have 25 mg on her chart and not the 50 mg. Advised her to call the pharmacy concerning her prescription. She was told to have them cancel all the 50 mg prescriptions. Patient verbalized understanding and agree.

## 2023-11-20 DIAGNOSIS — H16223 Keratoconjunctivitis sicca, not specified as Sjogren's, bilateral: Secondary | ICD-10-CM | POA: Diagnosis not present

## 2023-11-21 ENCOUNTER — Ambulatory Visit: Payer: Medicare Other | Attending: Cardiovascular Disease

## 2023-11-21 ENCOUNTER — Encounter: Payer: Self-pay | Admitting: Pharmacist

## 2023-11-21 DIAGNOSIS — I480 Paroxysmal atrial fibrillation: Secondary | ICD-10-CM | POA: Diagnosis not present

## 2023-11-21 DIAGNOSIS — Z5181 Encounter for therapeutic drug level monitoring: Secondary | ICD-10-CM | POA: Insufficient documentation

## 2023-11-21 LAB — POCT INR: INR: 2.1 (ref 2.0–3.0)

## 2023-11-21 NOTE — Patient Instructions (Signed)
 Continue 1 TABLET DAILY, EXCEPT 0.5 TABLET ON MONDAYS AND WEDNESDAYS. Recheck INR in 3 weeks. Hold Coumadin 5 days 1/10-1/14 [facility] [phone]

## 2023-11-21 NOTE — Pre-Procedure Instructions (Signed)
 Surgical Instructions   Your procedure is scheduled on November 28, 2023. Report to [facility] Main Entrance A at 6:30 A.M., then check in with the Admitting office. Any questions or running late day of surgery: call [phone]  Questions prior to your surgery date: call [phone], Monday-Friday, 8am-4pm. If you experience any cold or flu symptoms such as cough, fever, chills, shortness of breath, etc. between now and your scheduled surgery, please notify us  at the above number.     Remember:  Do not eat or drink after midnight the night before your surgery    Take these medicines the morning of surgery with A SIP OF WATER : Carboxymethylcellulose Sodium (THERATEARS) eye drops docusate sodium  (COLACE)  ezetimibe  (ZETIA )  isosorbide  mononitrate (IMDUR )  metoprolol  succinate (TOPROL -XL)  rosuvastatin  (CRESTOR )  umeclidinium-vilanterol (ANORO ELLIPTA ) inhaler Aspirin    May take these medicines IF NEEDED: acetaminophen  (TYLENOL )  albuterol  (VENTOLIN  HFA) inhaler - please bring inhaler with you morning of surgery fluticasone  (FLONASE ) nasal spray  metoprolol  tartrate (LOPRESSOR )  nitroGLYCERIN  (NITROSTAT ) - if dose taken prior to surgery, please call either of the above phone numbers   STOP taking your warfarin (COUMADIN ) five days prior to surgery. Your last dose will be January 9th.   One week prior to surgery, STOP taking any Aspirin  (unless otherwise instructed by your surgeon) Aleve, Naproxen, Ibuprofen, Motrin, Advil, Goody's, BC's, all herbal medications, fish oil, and non-prescription vitamins.                     Do NOT Smoke (Tobacco/Vaping) for 24 hours prior to your procedure.  If you use a CPAP at night, you may bring your mask/headgear for your overnight stay.   You will be asked to remove any contacts, glasses, piercing's, hearing aid's, dentures/partials prior to surgery. Please bring cases for these items if needed.    Patients discharged the day of  surgery will not be allowed to drive home, and someone needs to stay with them for 24 hours.  SURGICAL WAITING ROOM VISITATION Patients may have no more than 2 support people in the waiting area - these visitors may rotate.   Pre-op nurse will coordinate an appropriate time for 1 ADULT support person, who may not rotate, to accompany patient in pre-op.  Children under the age of 7 must have an adult with them who is not the patient and must remain in the main waiting area with an adult.  If the patient needs to stay at the hospital during part of their recovery, the visitor guidelines for inpatient rooms apply.  Please refer to the [facility] website for the visitor guidelines for any additional information.   If you received a COVID test during your pre-op visit  it is requested that you wear a mask when out in public, stay away from anyone that may not be feeling well and notify your surgeon if you develop symptoms. If you have been in contact with anyone that has tested positive in the last 10 days please notify you surgeon.      Pre-operative CHG Bathing Instructions   You can play a key role in reducing the risk of infection after surgery. Your skin needs to be as free of germs as possible. You can reduce the number of germs on your skin by washing with CHG (chlorhexidine  gluconate) soap before surgery. CHG is an antiseptic soap that kills germs and continues to kill germs even after washing.   DO NOT use if you have an  allergy to chlorhexidine /CHG or antibacterial soaps. If your skin becomes reddened or irritated, stop using the CHG and notify one of our RNs at [phone].              TAKE A SHOWER THE NIGHT BEFORE SURGERY AND THE DAY OF SURGERY    Please keep in mind the following:  DO NOT shave, including legs and underarms, 48 hours prior to surgery.   You may shave your face before/day of surgery.  Place clean sheets on your bed the night before surgery Use a clean  washcloth (not used since being washed) for each shower. DO NOT sleep with pet's night before surgery.  CHG Shower Instructions:  Wash your face and private area with normal soap. If you choose to wash your hair, wash first with your normal shampoo.  After you use shampoo/soap, rinse your hair and body thoroughly to remove shampoo/soap residue.  Turn the water  OFF and apply half the bottle of CHG soap to a CLEAN washcloth.  Apply CHG soap ONLY FROM YOUR NECK DOWN TO YOUR TOES (washing for 3-5 minutes)  DO NOT use CHG soap on face, private areas, open wounds, or sores.  Pay special attention to the area where your surgery is being performed.  If you are having back surgery, having someone wash your back for you may be helpful. Wait 2 minutes after CHG soap is applied, then you may rinse off the CHG soap.  Pat dry with a clean towel  Put on clean pajamas    Additional instructions for the day of surgery: DO NOT APPLY any lotions, deodorants, cologne, or perfumes.   Do not wear jewelry or makeup Do not wear nail polish, gel polish, artificial nails, or any other type of covering on natural nails (fingers and toes) Do not bring valuables to the hospital. [facility] is not responsible for valuables/personal belongings. Put on clean/comfortable clothes.  Please brush your teeth.  Ask your nurse before applying any prescription medications to the skin.

## 2023-11-22 ENCOUNTER — Other Ambulatory Visit: Payer: Self-pay

## 2023-11-22 ENCOUNTER — Telehealth: Payer: Self-pay | Admitting: Pharmacy Technician

## 2023-11-22 ENCOUNTER — Encounter (HOSPITAL_COMMUNITY)
Admission: RE | Admit: 2023-11-22 | Discharge: 2023-11-22 | Disposition: A | Discharge status: Home / Self Care | Payer: Medicare Other | Source: Ambulatory Visit | Attending: Surgery | Admitting: Surgery

## 2023-11-22 ENCOUNTER — Other Ambulatory Visit (HOSPITAL_COMMUNITY): Payer: Self-pay

## 2023-11-22 ENCOUNTER — Encounter (HOSPITAL_COMMUNITY): Payer: Self-pay

## 2023-11-22 ENCOUNTER — Encounter: Payer: Self-pay | Admitting: Pharmacist

## 2023-11-22 VITALS — BP 143/58 | HR 78 | Temp 97.6°F | Resp 18 | Ht 63.0 in | Wt 155.7 lb

## 2023-11-22 DIAGNOSIS — Z7901 Long term (current) use of anticoagulants: Secondary | ICD-10-CM | POA: Diagnosis not present

## 2023-11-22 DIAGNOSIS — Z01812 Encounter for preprocedural laboratory examination: Secondary | ICD-10-CM | POA: Insufficient documentation

## 2023-11-22 DIAGNOSIS — Z01818 Encounter for other preprocedural examination: Secondary | ICD-10-CM

## 2023-11-22 DIAGNOSIS — I70213 Atherosclerosis of native arteries of extremities with intermittent claudication, bilateral legs: Secondary | ICD-10-CM | POA: Insufficient documentation

## 2023-11-22 HISTORY — DX: Pulmonary hypertension, unspecified: I27.20

## 2023-11-22 HISTORY — DX: Gastro-esophageal reflux disease without esophagitis: K21.9

## 2023-11-22 HISTORY — DX: Age-related osteoporosis without current pathological fracture: M81.0

## 2023-11-22 HISTORY — DX: Personal history of other diseases of the digestive system: Z87.19

## 2023-11-22 LAB — CBC
HCT: 42.5 % (ref 36.0–46.0)
Hemoglobin: 14.2 g/dL (ref 12.0–15.0)
MCH: 32.3 pg (ref 26.0–34.0)
MCHC: 33.4 g/dL (ref 30.0–36.0)
MCV: 96.6 fL (ref 80.0–100.0)
Platelets: 173 10*3/uL (ref 150–400)
RBC: 4.4 MIL/uL (ref 3.87–5.11)
RDW: 13.1 % (ref 11.5–15.5)
WBC: 15.5 10*3/uL — ABNORMAL HIGH (ref 4.0–10.5)
nRBC: 0 % (ref 0.0–0.2)

## 2023-11-22 LAB — URINALYSIS, ROUTINE W REFLEX MICROSCOPIC
Bacteria, UA: NONE SEEN
Bilirubin Urine: NEGATIVE
Glucose, UA: NEGATIVE mg/dL
Ketones, ur: NEGATIVE mg/dL
Leukocytes,Ua: NEGATIVE
Nitrite: NEGATIVE
Protein, ur: NEGATIVE mg/dL
Specific Gravity, Urine: 1.003 — ABNORMAL LOW (ref 1.005–1.030)
pH: 6 (ref 5.0–8.0)

## 2023-11-22 LAB — COMPREHENSIVE METABOLIC PANEL
ALT: 28 U/L (ref 0–44)
AST: 31 U/L (ref 15–41)
Albumin: 4 g/dL (ref 3.5–5.0)
Alkaline Phosphatase: 45 U/L (ref 38–126)
Anion gap: 9 (ref 5–15)
BUN: 14 mg/dL (ref 8–23)
CO2: 28 mmol/L (ref 22–32)
Calcium: 9.2 mg/dL (ref 8.9–10.3)
Chloride: 102 mmol/L (ref 98–111)
Creatinine, Ser: 0.61 mg/dL (ref 0.44–1.00)
GFR, Estimated: >60 mL/min (ref >60)
Glucose, Bld: 102 mg/dL — ABNORMAL HIGH (ref 70–99)
Potassium: 4 mmol/L (ref 3.5–5.1)
Sodium: 139 mmol/L (ref 135–145)
Total Bilirubin: 0.6 mg/dL (ref 0.0–1.2)
Total Protein: 6.4 g/dL — ABNORMAL LOW (ref 6.5–8.1)

## 2023-11-22 LAB — APTT: aPTT: 32 s (ref 24–36)

## 2023-11-22 LAB — TYPE AND SCREEN
ABO/RH(D): O POS
Antibody Screen: NEGATIVE

## 2023-11-22 LAB — PROTIME-INR
INR: 1.9 — ABNORMAL HIGH (ref 0.8–1.2)
Prothrombin Time: 22.3 s — ABNORMAL HIGH (ref 11.4–15.2)

## 2023-11-22 LAB — SURGICAL PCR SCREEN
MRSA, PCR: NEGATIVE
Staphylococcus aureus: NEGATIVE

## 2023-11-22 MED ORDER — APIXABAN 5 MG PO TABS: 5.0000 mg | ORAL_TABLET | Freq: Two times a day (BID) | ORAL | 11 refills | Status: DC

## 2023-11-22 NOTE — Telephone Encounter (Signed)
 Will forward to pharmacy team to advise on aspirin pre/post procedure as well as switching to Eliquis.

## 2023-11-22 NOTE — Telephone Encounter (Signed)
 I agree that she should take aspirin 81 mg daily when she starts holding warfarin. The plan is to switch her from warfarin to Eliquis after her surgery.  Initiation of Eliquis is usually 1 to 2 days after surgery as long as there are no bleeding issues.

## 2023-11-22 NOTE — Telephone Encounter (Signed)
 Pharmacy Patient Advocate Encounter   Received notification from Patient Advice Request messages that prior authorization for repatha  is required/requested.   Insurance verification completed.   The patient is insured through [employer] .   Per test claim: PA required; PA submitted to above mentioned insurance via CoverMyMeds Key/confirmation #/EOC [id number] Status is pending

## 2023-11-22 NOTE — Telephone Encounter (Signed)
 Pharmacy Patient Advocate Encounter  Received notification from [employer] that Prior Authorization for repatha  has been APPROVED from 11/14/23 to 11/21/24. Ran test claim, Copay is $568.36- one month (DEDUCTIBLE). This test claim was processed through [facility]- copay amounts may vary at other pharmacies due to pharmacy/plan contracts, or as the patient moves through the different stages of their insurance plan.   PA #/Case ID/Reference #: [id number]

## 2023-11-22 NOTE — Progress Notes (Addendum)
 PCP - Dr. Lynwood Null Cardiologist - Dr. Deatrice Cage  PPM/ICD - denies   Chest x-ray - 11/16/20 EKG - 09/24/23 Stress Test - 02/04/21 ECHO - 04/05/23 Cardiac Cath - denies  Sleep Study - denies   DM- denies  Last dose of GLP1 agonist-  n/a   Blood Thinner Instructions: hold warfarin 5 days. Last dose 1/9 Aspirin  Instructions: start when warfarin is held   ERAS Protcol - no, NPO   COVID TEST- n/a   Anesthesia review: yes, cardiac hx  Patient denies shortness of breath, fever, cough and chest pain at PAT appointment   All instructions explained to the patient, with a verbal understanding of the material. Patient agrees to go over the instructions while at home for a better understanding. The opportunity to ask questions was provided.

## 2023-11-23 ENCOUNTER — Telehealth: Payer: Self-pay

## 2023-11-23 NOTE — Anesthesia Preprocedure Evaluation (Addendum)
 Anesthesia Evaluation  Patient identified by MRN, date of birth, ID band Patient awake    Reviewed: Allergy & Precautions, NPO status , Patient's Chart, lab work & pertinent test results, reviewed documented beta blocker date and time   Airway Mallampati: III  TM Distance: >3 FB Neck ROM: Full    Dental  (+) Dental Advisory Given, Loose, Missing,    Pulmonary sleep apnea , COPD,  COPD inhaler, neg recent URI, former smoker   Pulmonary exam normal breath sounds clear to auscultation       Cardiovascular hypertension, Pt. on medications and Pt. on home beta blockers pulmonary hypertension+ CAD, + Past MI, + CABG and + Peripheral Vascular Disease  Normal cardiovascular exam+ dysrhythmias Atrial Fibrillation  Rhythm:Regular Rate:Normal  Echo 04/05/23: 1. Left ventricular ejection fraction, by estimation, is 65 to 70%. The left ventricle has normal function. The left ventricle has no regional wall motion abnormalities. There is mild asymmetric left ventricular hypertrophy of the basal-septal segment. Left ventricular diastolic parameters are indeterminate.  2. Right ventricular systolic function is normal. The right ventricular size is normal. There is mildly elevated pulmonary artery systolic pressure. The estimated right ventricular systolic pressure is 43.7 mmHg.  3. Left atrial size was moderately dilated.  4. The mitral valve is degenerative. Mild mitral valve regurgitation. Moderate mitral annular calcification.  5. The aortic valve is tricuspid. There is moderate calcification of the aortic valve. There is moderate thickening of the aortic valve. Aortic valve regurgitation is trivial. Aortic valve sclerosis/calcification is present, without any evidence of aortic stenosis.  6. The inferior vena cava is normal in size with greater than 50% respiratory variability, suggesting right atrial pressure of 3 mmHg.  - Comparison(s): No  significant change from prior study.      Neuro/Psych  Headaches, Seizures -, Well Controlled,   Anxiety        GI/Hepatic Neg liver ROS,GERD  Controlled,,  Endo/Other  negative endocrine ROS    Renal/GU negative Renal ROS     Musculoskeletal  (+) Arthritis ,    Abdominal   Peds  Hematology  (+) Blood dyscrasia, anemia   Anesthesia Other Findings   Reproductive/Obstetrics                             Anesthesia Physical Anesthesia Plan  ASA: 4  Anesthesia Plan: General   Post-op Pain Management: Tylenol  PO (pre-op)*   Induction: Intravenous  PONV Risk Score and Plan: 4 or greater and Ondansetron , Treatment may vary due to age or medical condition and Dexamethasone   Airway Management Planned: Oral ETT  Additional Equipment: Arterial line  Intra-op Plan:   Post-operative Plan: Extubation in OR  Informed Consent: I have reviewed the patients History and Physical, chart, labs and discussed the procedure including the risks, benefits and alternatives for the proposed anesthesia with the patient or authorized representative who has indicated his/her understanding and acceptance.     Dental advisory given  Plan Discussed with: CRNA  Anesthesia Plan Comments: (PAT note written 11/23/2023 by Allison Zelenak, PA-C.  )        Anesthesia Quick Evaluation
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
## 2023-11-23 NOTE — Progress Notes (Signed)
 Cherene Julian, RN, notified of elevated WBCs

## 2023-11-23 NOTE — Telephone Encounter (Signed)
 PAT sent message to inform us that pts WBC was 15.5-pt chronically has WBC that are elevated. MD is aware and is proceeding with surgery as planned.

## 2023-11-26 ENCOUNTER — Telehealth: Payer: Self-pay

## 2023-11-26 NOTE — Telephone Encounter (Signed)
 Auth Submission: NO AUTH NEEDED Site of care: Site of care: [facility] Payer: Medicare a/b, bcbs supp Medication & CPT/J Code(s) submitted: Leqvio  (Inclisiran) J1306 Route of submission (phone, fax, portal):  Phone # Fax # Auth type: Buy/Bill HB Units/visits requested: 284mg  x 2 doses Reference number:  Approval from: 08/07/23 to 12/13/24

## 2023-11-28 ENCOUNTER — Inpatient Hospital Stay (HOSPITAL_COMMUNITY): Payer: Medicare Other | Admitting: Certified Registered Nurse Anesthetist

## 2023-11-28 ENCOUNTER — Other Ambulatory Visit: Payer: Self-pay

## 2023-11-28 ENCOUNTER — Other Ambulatory Visit (HOSPITAL_COMMUNITY): Payer: Self-pay

## 2023-11-28 ENCOUNTER — Inpatient Hospital Stay (HOSPITAL_COMMUNITY): Payer: Medicare Other

## 2023-11-28 ENCOUNTER — Inpatient Hospital Stay (HOSPITAL_COMMUNITY)
Admission: RE | Admit: 2023-11-28 | Discharge: 2023-11-29 | DRG: 272 | Disposition: A | Discharge status: Home Health | Payer: Medicare Other | Attending: Surgery | Admitting: Surgery

## 2023-11-28 ENCOUNTER — Inpatient Hospital Stay (HOSPITAL_COMMUNITY): Payer: Self-pay | Admitting: Vascular Surgery

## 2023-11-28 ENCOUNTER — Encounter (HOSPITAL_COMMUNITY)
Admission: RE | Disposition: A | Discharge status: Home Health | Payer: Self-pay | Source: Home / Self Care | Attending: Surgery

## 2023-11-28 ENCOUNTER — Encounter (HOSPITAL_COMMUNITY): Payer: Self-pay | Admitting: Surgery

## 2023-11-28 ENCOUNTER — Ambulatory Visit: Payer: Medicare Other

## 2023-11-28 DIAGNOSIS — Z85828 Personal history of other malignant neoplasm of skin: Secondary | ICD-10-CM | POA: Diagnosis not present

## 2023-11-28 DIAGNOSIS — Z79899 Other long term (current) drug therapy: Secondary | ICD-10-CM | POA: Diagnosis not present

## 2023-11-28 DIAGNOSIS — I272 Pulmonary hypertension, unspecified: Secondary | ICD-10-CM | POA: Diagnosis present

## 2023-11-28 DIAGNOSIS — Z881 Allergy status to other antibiotic agents status: Secondary | ICD-10-CM

## 2023-11-28 DIAGNOSIS — Z7901 Long term (current) use of anticoagulants: Secondary | ICD-10-CM | POA: Diagnosis not present

## 2023-11-28 DIAGNOSIS — I480 Paroxysmal atrial fibrillation: Secondary | ICD-10-CM | POA: Diagnosis present

## 2023-11-28 DIAGNOSIS — Z87891 Personal history of nicotine dependence: Secondary | ICD-10-CM | POA: Diagnosis not present

## 2023-11-28 DIAGNOSIS — Z882 Allergy status to sulfonamides status: Secondary | ICD-10-CM

## 2023-11-28 DIAGNOSIS — I7090 Unspecified atherosclerosis: Principal | ICD-10-CM | POA: Diagnosis present

## 2023-11-28 DIAGNOSIS — Z95828 Presence of other vascular implants and grafts: Secondary | ICD-10-CM | POA: Diagnosis not present

## 2023-11-28 DIAGNOSIS — Z888 Allergy status to other drugs, medicaments and biological substances status: Secondary | ICD-10-CM

## 2023-11-28 DIAGNOSIS — E785 Hyperlipidemia, unspecified: Secondary | ICD-10-CM | POA: Diagnosis not present

## 2023-11-28 DIAGNOSIS — Z951 Presence of aortocoronary bypass graft: Secondary | ICD-10-CM

## 2023-11-28 DIAGNOSIS — I70213 Atherosclerosis of native arteries of extremities with intermittent claudication, bilateral legs: Secondary | ICD-10-CM | POA: Diagnosis not present

## 2023-11-28 DIAGNOSIS — I252 Old myocardial infarction: Secondary | ICD-10-CM | POA: Diagnosis not present

## 2023-11-28 DIAGNOSIS — I739 Peripheral vascular disease, unspecified: Secondary | ICD-10-CM | POA: Diagnosis present

## 2023-11-28 DIAGNOSIS — K219 Gastro-esophageal reflux disease without esophagitis: Secondary | ICD-10-CM | POA: Diagnosis present

## 2023-11-28 DIAGNOSIS — J449 Chronic obstructive pulmonary disease, unspecified: Secondary | ICD-10-CM | POA: Diagnosis present

## 2023-11-28 DIAGNOSIS — Z91040 Latex allergy status: Secondary | ICD-10-CM | POA: Diagnosis not present

## 2023-11-28 DIAGNOSIS — Z85118 Personal history of other malignant neoplasm of bronchus and lung: Secondary | ICD-10-CM | POA: Diagnosis not present

## 2023-11-28 DIAGNOSIS — I10 Essential (primary) hypertension: Secondary | ICD-10-CM

## 2023-11-28 DIAGNOSIS — I70212 Atherosclerosis of native arteries of extremities with intermittent claudication, left leg: Principal | ICD-10-CM

## 2023-11-28 DIAGNOSIS — I2510 Atherosclerotic heart disease of native coronary artery without angina pectoris: Secondary | ICD-10-CM | POA: Diagnosis not present

## 2023-11-28 DIAGNOSIS — E7800 Pure hypercholesterolemia, unspecified: Secondary | ICD-10-CM | POA: Diagnosis present

## 2023-11-28 DIAGNOSIS — Z01818 Encounter for other preprocedural examination: Secondary | ICD-10-CM

## 2023-11-28 DIAGNOSIS — Z902 Acquired absence of lung [part of]: Secondary | ICD-10-CM

## 2023-11-28 DIAGNOSIS — F419 Anxiety disorder, unspecified: Secondary | ICD-10-CM | POA: Diagnosis present

## 2023-11-28 DIAGNOSIS — Z8249 Family history of ischemic heart disease and other diseases of the circulatory system: Secondary | ICD-10-CM | POA: Diagnosis not present

## 2023-11-28 DIAGNOSIS — L309 Dermatitis, unspecified: Secondary | ICD-10-CM | POA: Diagnosis present

## 2023-11-28 HISTORY — PX: PATCH ANGIOPLASTY: SHX6230

## 2023-11-28 HISTORY — PX: ENDARTERECTOMY FEMORAL: SHX5804

## 2023-11-28 LAB — CBC
HCT: 34.9 % — ABNORMAL LOW (ref 36.0–46.0)
Hemoglobin: 12.1 g/dL (ref 12.0–15.0)
MCH: 32.5 pg (ref 26.0–34.0)
MCHC: 34.7 g/dL (ref 30.0–36.0)
MCV: 93.8 fL (ref 80.0–100.0)
Platelets: 155 10*3/uL (ref 150–400)
RBC: 3.72 MIL/uL — ABNORMAL LOW (ref 3.87–5.11)
RDW: 13 % (ref 11.5–15.5)
WBC: 18.1 10*3/uL — ABNORMAL HIGH (ref 4.0–10.5)
nRBC: 0.2 % (ref 0.0–0.2)

## 2023-11-28 LAB — PROTIME-INR
INR: 1.1 (ref 0.8–1.2)
Prothrombin Time: 14.7 s (ref 11.4–15.2)

## 2023-11-28 LAB — CREATININE, SERUM
Creatinine, Ser: 0.62 mg/dL (ref 0.44–1.00)
GFR, Estimated: >60 mL/min (ref >60)

## 2023-11-28 LAB — APTT: aPTT: 26 s (ref 24–36)

## 2023-11-28 SURGERY — ENDARTERECTOMY, FEMORAL
Anesthesia: General | Laterality: Left

## 2023-11-28 MED ORDER — IODIXANOL 320 MG/ML IV SOLN
INTRAVENOUS | Status: DC | PRN
  Administered 2023-11-28: 25 mL

## 2023-11-28 MED ORDER — ALBUMIN HUMAN 5 % IV SOLN: INTRAVENOUS | Status: DC | PRN

## 2023-11-28 MED ORDER — FENTANYL CITRATE (PF) 100 MCG/2ML IJ SOLN
INTRAMUSCULAR | Status: AC
  Administered 2023-11-28: 25 ug via INTRAVENOUS
  Filled 2023-11-28: qty 2

## 2023-11-28 MED ORDER — FLUTICASONE PROPIONATE 50 MCG/ACT NA SUSP: 2.0000 | Freq: Every day | NASAL | Status: DC | PRN

## 2023-11-28 MED ORDER — CHLORHEXIDINE GLUCONATE CLOTH 2 % EX PADS: 6.0000 | MEDICATED_PAD | Freq: Once | CUTANEOUS | Status: DC

## 2023-11-28 MED ORDER — ACETAMINOPHEN 500 MG PO TABS: 500.0000 mg | ORAL_TABLET | Freq: Three times a day (TID) | ORAL | Status: DC | PRN

## 2023-11-28 MED ORDER — OXYCODONE-ACETAMINOPHEN 5-325 MG PO TABS
1.0000 | ORAL_TABLET | ORAL | Status: DC | PRN
Start: 2023-11-28 — End: 2023-11-29

## 2023-11-28 MED ORDER — ACETAMINOPHEN 650 MG RE SUPP: 325.0000 mg | RECTAL | Status: DC | PRN

## 2023-11-28 MED ORDER — DOCUSATE SODIUM 100 MG PO CAPS: 100.0000 mg | ORAL_CAPSULE | Freq: Every day | ORAL | Status: DC

## 2023-11-28 MED ORDER — PANTOPRAZOLE SODIUM 40 MG PO TBEC
40.0000 mg | DELAYED_RELEASE_TABLET | Freq: Every day | ORAL | Status: DC
  Filled 2023-11-28: qty 1

## 2023-11-28 MED ORDER — METOPROLOL TARTRATE 5 MG/5ML IV SOLN: 2.0000 mg | INTRAVENOUS | Status: DC | PRN

## 2023-11-28 MED ORDER — KETOROLAC TROMETHAMINE 0.5 % OP SOLN
1.0000 [drp] | Freq: Four times a day (QID) | OPHTHALMIC | Status: DC
  Filled 2023-11-28: qty 5

## 2023-11-28 MED ORDER — HYDRALAZINE HCL 20 MG/ML IJ SOLN: 5.0000 mg | INTRAMUSCULAR | Status: DC | PRN

## 2023-11-28 MED ORDER — ARTIFICIAL TEARS OPHTHALMIC OINT
TOPICAL_OINTMENT | Freq: Once | OPHTHALMIC | Status: DC
  Filled 2023-11-28: qty 3.5

## 2023-11-28 MED ORDER — MAGNESIUM SULFATE 2 GM/50ML IV SOLN: 2.0000 g | Freq: Every day | INTRAVENOUS | Status: DC | PRN

## 2023-11-28 MED ORDER — EZETIMIBE 10 MG PO TABS
10.0000 mg | ORAL_TABLET | Freq: Every day | ORAL | Status: DC
  Filled 2023-11-28 (×2): qty 1

## 2023-11-28 MED ORDER — ISOSORBIDE MONONITRATE ER 30 MG PO TB24
30.0000 mg | ORAL_TABLET | Freq: Every day | ORAL | Status: DC
  Administered 2023-11-29: 30 mg via ORAL
  Filled 2023-11-28: qty 1

## 2023-11-28 MED ORDER — DOCUSATE SODIUM 100 MG PO CAPS
100.0000 mg | ORAL_CAPSULE | Freq: Every day | ORAL | Status: DC
  Administered 2023-11-29: 100 mg via ORAL
  Filled 2023-11-28: qty 1

## 2023-11-28 MED ORDER — UMECLIDINIUM-VILANTEROL 62.5-25 MCG/ACT IN AEPB
1.0000 | INHALATION_SPRAY | Freq: Every day | RESPIRATORY_TRACT | Status: DC
  Administered 2023-11-29: 1 via RESPIRATORY_TRACT
  Filled 2023-11-28: qty 14

## 2023-11-28 MED ORDER — FENTANYL CITRATE (PF) 100 MCG/2ML IJ SOLN: 25.0000 ug | INTRAMUSCULAR | Status: DC | PRN

## 2023-11-28 MED ORDER — METOPROLOL SUCCINATE ER 25 MG PO TB24
25.0000 mg | ORAL_TABLET | Freq: Every day | ORAL | Status: DC
  Administered 2023-11-28: 25 mg via ORAL
  Filled 2023-11-28 (×2): qty 1

## 2023-11-28 MED ORDER — HEPARIN 6000 UNIT IRRIGATION SOLUTION
Status: DC | PRN
  Administered 2023-11-28: 1

## 2023-11-28 MED ORDER — ONDANSETRON HCL 4 MG/2ML IJ SOLN: 4.0000 mg | Freq: Four times a day (QID) | INTRAMUSCULAR | Status: DC | PRN

## 2023-11-28 MED ORDER — ROSUVASTATIN CALCIUM 20 MG PO TABS
20.0000 mg | ORAL_TABLET | Freq: Every day | ORAL | Status: DC
  Filled 2023-11-28: qty 1

## 2023-11-28 MED ORDER — 0.9 % SODIUM CHLORIDE (POUR BTL) OPTIME
TOPICAL | Status: DC | PRN
  Administered 2023-11-28: 1000 mL

## 2023-11-28 MED ORDER — DIPHENHYDRAMINE HCL 50 MG/ML IJ SOLN
INTRAMUSCULAR | Status: DC | PRN
  Administered 2023-11-28: 12.5 mg via INTRAVENOUS

## 2023-11-28 MED ORDER — ORAL CARE MOUTH RINSE: 15.0000 mL | Freq: Once | OROMUCOSAL | Status: AC

## 2023-11-28 MED ORDER — VITAMIN B-6 100 MG PO TABS
100.0000 mg | ORAL_TABLET | Freq: Every day | ORAL | Status: DC
  Administered 2023-11-29: 100 mg via ORAL
  Filled 2023-11-28 (×2): qty 1

## 2023-11-28 MED ORDER — PHENYLEPHRINE 80 MCG/ML (10ML) SYRINGE FOR IV PUSH (FOR BLOOD PRESSURE SUPPORT)
PREFILLED_SYRINGE | INTRAVENOUS | Status: DC | PRN
  Administered 2023-11-28 (×4): 80 ug via INTRAVENOUS

## 2023-11-28 MED ORDER — METOPROLOL TARTRATE 25 MG PO TABS: 25.0000 mg | ORAL_TABLET | Freq: Every day | ORAL | Status: DC | PRN

## 2023-11-28 MED ORDER — LIDOCAINE 2% (20 MG/ML) 5 ML SYRINGE
INTRAMUSCULAR | Status: DC | PRN
  Administered 2023-11-28: 100 mg via INTRAVENOUS

## 2023-11-28 MED ORDER — DROPERIDOL 2.5 MG/ML IJ SOLN
0.6250 mg | Freq: Once | INTRAMUSCULAR | Status: DC | PRN
Start: 2023-11-28 — End: 2023-11-28

## 2023-11-28 MED ORDER — LISINOPRIL 20 MG PO TABS
20.0000 mg | ORAL_TABLET | Freq: Two times a day (BID) | ORAL | Status: DC
  Administered 2023-11-29: 20 mg via ORAL
  Filled 2023-11-28 (×2): qty 1

## 2023-11-28 MED ORDER — TETRACAINE HCL 0.5 % OP SOLN
1.0000 [drp] | Freq: Once | OPHTHALMIC | Status: DC
  Filled 2023-11-28: qty 4

## 2023-11-28 MED ORDER — PHENOL 1.4 % MT LIQD: 1.0000 | OROMUCOSAL | Status: DC | PRN

## 2023-11-28 MED ORDER — FENTANYL CITRATE (PF) 250 MCG/5ML IJ SOLN
INTRAMUSCULAR | Status: DC | PRN
  Administered 2023-11-28 (×5): 50 ug via INTRAVENOUS

## 2023-11-28 MED ORDER — HEMOSTATIC AGENTS (NO CHARGE) OPTIME
TOPICAL | Status: DC | PRN
  Administered 2023-11-28: 1 via TOPICAL

## 2023-11-28 MED ORDER — POLYVINYL ALCOHOL 1.4 % OP SOLN
1.0000 [drp] | Freq: Three times a day (TID) | OPHTHALMIC | Status: DC
Start: 2023-11-28 — End: 2023-11-28
  Filled 2023-11-28: qty 15

## 2023-11-28 MED ORDER — PROPOFOL 10 MG/ML IV BOLUS
INTRAVENOUS | Status: DC | PRN
  Administered 2023-11-28: 120 mg via INTRAVENOUS

## 2023-11-28 MED ORDER — GUAIFENESIN-DM 100-10 MG/5ML PO SYRP: 15.0000 mL | ORAL_SOLUTION | ORAL | Status: DC | PRN

## 2023-11-28 MED ORDER — PROTAMINE SULFATE 10 MG/ML IV SOLN
INTRAVENOUS | Status: DC | PRN
  Administered 2023-11-28: 10 mg via INTRAVENOUS
  Administered 2023-11-28: 40 mg via INTRAVENOUS

## 2023-11-28 MED ORDER — PHENYLEPHRINE HCL-NACL 20-0.9 MG/250ML-% IV SOLN
INTRAVENOUS | Status: DC | PRN
  Administered 2023-11-28: 15 ug/min via INTRAVENOUS

## 2023-11-28 MED ORDER — POTASSIUM CHLORIDE CRYS ER 20 MEQ PO TBCR: 20.0000 meq | EXTENDED_RELEASE_TABLET | Freq: Every day | ORAL | Status: DC | PRN

## 2023-11-28 MED ORDER — BISACODYL 5 MG PO TBEC: 5.0000 mg | DELAYED_RELEASE_TABLET | Freq: Every day | ORAL | Status: DC | PRN

## 2023-11-28 MED ORDER — SENNOSIDES-DOCUSATE SODIUM 8.6-50 MG PO TABS: 1.0000 | ORAL_TABLET | Freq: Every evening | ORAL | Status: DC | PRN

## 2023-11-28 MED ORDER — DEXAMETHASONE SODIUM PHOSPHATE 10 MG/ML IJ SOLN
INTRAMUSCULAR | Status: DC | PRN
  Administered 2023-11-28: 10 mg via INTRAVENOUS

## 2023-11-28 MED ORDER — CHLORHEXIDINE GLUCONATE 0.12 % MT SOLN
15.0000 mL | Freq: Once | OROMUCOSAL | Status: AC
Start: 2023-11-28 — End: 2023-11-28
  Administered 2023-11-28: 15 mL via OROMUCOSAL
  Filled 2023-11-28: qty 15

## 2023-11-28 MED ORDER — HEPARIN SODIUM (PORCINE) 1000 UNIT/ML IJ SOLN
INTRAMUSCULAR | Status: DC | PRN
  Administered 2023-11-28: 7000 [IU] via INTRAVENOUS
  Administered 2023-11-28 (×2): 1000 [IU] via INTRAVENOUS

## 2023-11-28 MED ORDER — SODIUM CHLORIDE 0.9 % IV SOLN: 500.0000 mL | Freq: Once | INTRAVENOUS | Status: DC | PRN

## 2023-11-28 MED ORDER — SODIUM CHLORIDE 0.9 % IV SOLN: INTRAVENOUS | Status: DC

## 2023-11-28 MED ORDER — VANCOMYCIN HCL IN DEXTROSE 1-5 GM/200ML-% IV SOLN
1000.0000 mg | Freq: Two times a day (BID) | INTRAVENOUS | Status: AC
  Administered 2023-11-28 – 2023-11-29 (×2): 1000 mg via INTRAVENOUS
  Filled 2023-11-28 (×2): qty 200

## 2023-11-28 MED ORDER — INCLISIRAN SODIUM 284 MG/1.5ML ~~LOC~~ SOSY: 284.0000 mg | PREFILLED_SYRINGE | SUBCUTANEOUS | Status: DC

## 2023-11-28 MED ORDER — ACETAMINOPHEN 500 MG PO TABS
1000.0000 mg | ORAL_TABLET | Freq: Once | ORAL | Status: AC
  Administered 2023-11-28: 1000 mg via ORAL
  Filled 2023-11-28: qty 2

## 2023-11-28 MED ORDER — SUGAMMADEX SODIUM 200 MG/2ML IV SOLN
INTRAVENOUS | Status: DC | PRN
  Administered 2023-11-28: 200 mg via INTRAVENOUS

## 2023-11-28 MED ORDER — ROCURONIUM BROMIDE 10 MG/ML (PF) SYRINGE
PREFILLED_SYRINGE | INTRAVENOUS | Status: DC | PRN
  Administered 2023-11-28: 60 mg via INTRAVENOUS
  Administered 2023-11-28: 30 mg via INTRAVENOUS
  Administered 2023-11-28: 10 mg via INTRAVENOUS

## 2023-11-28 MED ORDER — ALBUTEROL SULFATE (2.5 MG/3ML) 0.083% IN NEBU: 2.5000 mg | INHALATION_SOLUTION | Freq: Four times a day (QID) | RESPIRATORY_TRACT | Status: DC | PRN

## 2023-11-28 MED ORDER — BSS IO SOLN
15.0000 mL | Freq: Once | INTRAOCULAR | Status: DC
  Filled 2023-11-28: qty 15

## 2023-11-28 MED ORDER — UMECLIDINIUM-VILANTEROL 62.5-25 MCG/ACT IN AEPB: 1.0000 | INHALATION_SPRAY | Freq: Every day | RESPIRATORY_TRACT | Status: DC

## 2023-11-28 MED ORDER — ACETAMINOPHEN 325 MG PO TABS: 325.0000 mg | ORAL_TABLET | ORAL | Status: DC | PRN

## 2023-11-28 MED ORDER — LACTATED RINGERS IV SOLN: INTRAVENOUS | Status: DC

## 2023-11-28 MED ORDER — HYDROCORTISONE VALERATE 0.2 % EX CREA
1.0000 | TOPICAL_CREAM | CUTANEOUS | Status: DC
  Filled 2023-11-28: qty 15

## 2023-11-28 MED ORDER — ONDANSETRON HCL 4 MG/2ML IJ SOLN
INTRAMUSCULAR | Status: DC | PRN
  Administered 2023-11-28: 4 mg via INTRAVENOUS

## 2023-11-28 MED ORDER — HYDROMORPHONE HCL 1 MG/ML IJ SOLN: 0.5000 mg | INTRAMUSCULAR | Status: DC | PRN

## 2023-11-28 MED ORDER — ACETAMINOPHEN 500 MG PO TABS
ORAL_TABLET | ORAL | Status: AC
  Filled 2023-11-28: qty 1

## 2023-11-28 MED ORDER — SODIUM CHLORIDE 0.9 % IV SOLN: INTRAVENOUS | Status: AC

## 2023-11-28 MED ORDER — NITROGLYCERIN 0.4 MG SL SUBL: 0.4000 mg | SUBLINGUAL_TABLET | SUBLINGUAL | Status: DC | PRN

## 2023-11-28 MED ORDER — PROPOFOL 10 MG/ML IV BOLUS
INTRAVENOUS | Status: AC
  Filled 2023-11-28: qty 20

## 2023-11-28 MED ORDER — HEPARIN 6000 UNIT IRRIGATION SOLUTION
Status: AC
Start: 2023-11-28 — End: ?
  Filled 2023-11-28: qty 500

## 2023-11-28 MED ORDER — VANCOMYCIN HCL IN DEXTROSE 1-5 GM/200ML-% IV SOLN
1000.0000 mg | INTRAVENOUS | Status: AC
  Administered 2023-11-28: 1000 mg via INTRAVENOUS
  Filled 2023-11-28: qty 200

## 2023-11-28 MED ORDER — HEPARIN SODIUM (PORCINE) 5000 UNIT/ML IJ SOLN
5000.0000 [IU] | Freq: Three times a day (TID) | INTRAMUSCULAR | Status: DC
  Filled 2023-11-28 (×2): qty 1

## 2023-11-28 MED ORDER — ASPIRIN 81 MG PO TBEC
81.0000 mg | DELAYED_RELEASE_TABLET | Freq: Every day | ORAL | Status: DC
  Administered 2023-11-28: 81 mg via ORAL
  Filled 2023-11-28 (×2): qty 1

## 2023-11-28 MED ORDER — GLYCOPYRROLATE 0.2 MG/ML IJ SOLN
INTRAMUSCULAR | Status: DC | PRN
  Administered 2023-11-28 (×2): .1 mg via INTRAVENOUS

## 2023-11-28 MED ORDER — FENTANYL CITRATE (PF) 250 MCG/5ML IJ SOLN
INTRAMUSCULAR | Status: AC
  Filled 2023-11-28: qty 5

## 2023-11-28 MED ORDER — VALACYCLOVIR HCL 500 MG PO TABS
500.0000 mg | ORAL_TABLET | Freq: Every evening | ORAL | Status: DC
  Administered 2023-11-28: 500 mg via ORAL
  Filled 2023-11-28 (×2): qty 1

## 2023-11-28 MED ORDER — ALUM & MAG HYDROXIDE-SIMETH 200-200-20 MG/5ML PO SUSP: 15.0000 mL | ORAL | Status: DC | PRN

## 2023-11-28 MED ORDER — HEPARIN 6000 UNIT IRRIGATION SOLUTION
Status: AC
  Filled 2023-11-28: qty 500

## 2023-11-28 MED ORDER — EPHEDRINE SULFATE-NACL 50-0.9 MG/10ML-% IV SOSY
PREFILLED_SYRINGE | INTRAVENOUS | Status: DC | PRN
  Administered 2023-11-28: 10 mg via INTRAVENOUS
  Administered 2023-11-28: 5 mg via INTRAVENOUS

## 2023-11-28 MED ORDER — VITAMIN B-12 1000 MCG PO TABS
1000.0000 ug | ORAL_TABLET | Freq: Every day | ORAL | Status: DC
Start: 2023-11-28 — End: 2023-11-29
  Administered 2023-11-29: 1000 ug via ORAL
  Filled 2023-11-28: qty 1

## 2023-11-28 SURGICAL SUPPLY — 61 items
BAG COUNTER SPONGE SURGICOUNT (BAG) ×1 IMPLANT
CANISTER SUCT 3000ML PPV (MISCELLANEOUS) ×1 IMPLANT
CATH BEACON 5 .035 65 KMP TIP (CATHETERS) IMPLANT
CATH EMB LF 4FRX80 (CATHETERS) IMPLANT
CATH OMNI FLUSH 5F 65CM (CATHETERS) ×1 IMPLANT
CATH QUICKCROSS SUPP .035X90CM (MICROCATHETER) IMPLANT
CATH SHOCKWAVE E8 5X80 (CATHETERS) IMPLANT
CLIP TI MEDIUM 24 (CLIP) ×1 IMPLANT
CLIP TI WIDE RED SMALL 24 (CLIP) ×1 IMPLANT
DCB RANGER 5.0X200 150 (BALLOONS) IMPLANT
DERMABOND ADVANCED .7 DNX12 (GAUZE/BANDAGES/DRESSINGS) ×1 IMPLANT
DEVICE TORQUE SEADRAGON GRN (MISCELLANEOUS) IMPLANT
DRSG TEGADERM 2-3/8X2-3/4 SM (GAUZE/BANDAGES/DRESSINGS) IMPLANT
ELECT REM PT RETURN 9FT ADLT (ELECTROSURGICAL) ×1
ELECTRODE REM PT RTRN 9FT ADLT (ELECTROSURGICAL) ×1 IMPLANT
GLIDEWIRE ADV .035X180CM (WIRE) IMPLANT
GLOVE SURG SS PI 7.5 STRL IVOR (GLOVE) ×3 IMPLANT
GOWN STRL REUS W/ TWL LRG LVL3 (GOWN DISPOSABLE) ×2 IMPLANT
GOWN STRL REUS W/ TWL XL LVL3 (GOWN DISPOSABLE) ×1 IMPLANT
GUIDEWIRE ANGLED .035X150CM (WIRE) IMPLANT
HEMOSTAT SNOW SURGICEL 2X4 (HEMOSTASIS) IMPLANT
KIT BASIN OR (CUSTOM PROCEDURE TRAY) ×1 IMPLANT
KIT ENCORE 26 ADVANTAGE (KITS) IMPLANT
KIT TURNOVER KIT B (KITS) ×1 IMPLANT
LOOP VESSEL MINI RED (MISCELLANEOUS) IMPLANT
NS IRRIG 1000ML POUR BTL (IV SOLUTION) ×2 IMPLANT
PACK ENDO MINOR (CUSTOM PROCEDURE TRAY) ×1 IMPLANT
PACK PERIPHERAL VASCULAR (CUSTOM PROCEDURE TRAY) ×1 IMPLANT
PAD ARMBOARD 7.5X6 YLW CONV (MISCELLANEOUS) ×2 IMPLANT
PATCH VASC XENOSURE 1X6 (Vascular Products) IMPLANT
RANGER DCB 5.0X200 150 (BALLOONS) ×1
SET COLLECT BLD 21X3/4 12 (NEEDLE) IMPLANT
SET MICROPUNCTURE 5F STIFF (MISCELLANEOUS) ×1 IMPLANT
SET WALTER ACTIVATION W/DRAPE (SET/KITS/TRAYS/PACK) IMPLANT
SHEATH BRITE TIP 7FRX11 (SHEATH) IMPLANT
SHEATH PINNACLE 5F 10CM (SHEATH) ×1 IMPLANT
SHEATH PINNACLE 8F 10CM (SHEATH) ×1 IMPLANT
SHEATH PINNACLE ST 6F 45CM (SHEATH) IMPLANT
SPONGE INTESTINAL PEANUT (DISPOSABLE) ×1 IMPLANT
STOPCOCK 4 WAY LG BORE MALE ST (IV SETS) IMPLANT
STOPCOCK MORSE 400PSI 3WAY (MISCELLANEOUS) ×1 IMPLANT
SUT ETHILON 3 0 PS 1 (SUTURE) IMPLANT
SUT MNCRL AB 4-0 PS2 18 (SUTURE) IMPLANT
SUT PROLENE 5 0 C 1 24 (SUTURE) ×1 IMPLANT
SUT PROLENE 6 0 BV (SUTURE) ×1 IMPLANT
SUT VIC AB 2-0 CT1 TAPERPNT 27 (SUTURE) ×1 IMPLANT
SUT VIC AB 3-0 SH 27X BRD (SUTURE) ×1 IMPLANT
SYR 10ML LL (SYRINGE) IMPLANT
SYR 20ML LL LF (SYRINGE) IMPLANT
SYR 3ML LL SCALE MARK (SYRINGE) IMPLANT
SYR MEDRAD MARK V 150ML (SYRINGE) ×1 IMPLANT
TOWEL GREEN STERILE (TOWEL DISPOSABLE) ×1 IMPLANT
TUBING EXTENTION W/L.L. (IV SETS) IMPLANT
TUBING HIGH PRESSURE 120CM (CONNECTOR) ×1 IMPLANT
UNDERPAD 30X36 HEAVY ABSORB (UNDERPADS AND DIAPERS) ×1 IMPLANT
WATER STERILE IRR 1000ML POUR (IV SOLUTION) ×1 IMPLANT
WIRE AMPLATZ SS-J .035X180CM (WIRE) IMPLANT
WIRE AMPLATZ SS-J .035X260CM (WIRE) IMPLANT
WIRE BENTSON .035X145CM (WIRE) ×1 IMPLANT
WIRE G V18X300 ST (WIRE) IMPLANT
WIRE SPARTACORE .014X300CM (WIRE) IMPLANT

## 2023-11-28 NOTE — Progress Notes (Signed)
 [facility]  Subjective  - Doing well over and daughter is in the room with her.   Objective (!) 155/76 64 97.6 F (36.4 C) (Oral) 18 95%  Intake/Output Summary (Last 24 hours) at 11/28/2023 1615 Last data filed at 11/28/2023 1238 Gross per 24 hour  Intake 1550 ml  Output 800 ml  Net 750 ml    Doppler biphasic DP left Left groin with ecchymosis, without hematoma Lungs non labored breathing Heart RRR  Assessment/Planning: S/P Left femoral artery endarterectomy and lithotripsy/angioplasty distal SFA stenosis  Improved inflow to the tibials Stable post op Plan PT/OT for mobility Will give ASA today and restart Eliquis  tomorrow with Plavix  per Dr. Charlotte Cookey and Dr. Anette Kehr discussion.      Rocky Cipro 11/28/2023 4:15 PM --  Laboratory Lab Results: No results for input(s): "WBC", "HGB", "HCT", "PLT" in the last 72 hours. BMET No results for input(s): "NA", "K", "CL", "CO2", "GLUCOSE", "BUN", "CREATININE", "CALCIUM " in the last 72 hours.  COAG Lab Results  Component Value Date   INR 1.1 11/28/2023   INR 1.9 (H) 11/22/2023   INR 2.1 11/21/2023   No results found for: "PTT"
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
## 2023-11-28 NOTE — Anesthesia Postprocedure Evaluation (Signed)
 Anesthesia Post Note  Patient: [name]  Procedure(s) Performed: LEFT COMMON FEMORAL ENDARTERECTOMY (Left) PATCH ANGIOPLASTY (Left) INTRAVASCULAR LITHOTRIPSY PROCEDURE (Left)     Patient location during evaluation: PACU Anesthesia Type: General Level of consciousness: sedated and patient cooperative Pain management: pain level controlled Vital Signs Assessment: post-procedure vital signs reviewed and stable Respiratory status: spontaneous breathing Cardiovascular status: stable Anesthetic complications: no   No notable events documented.  Last Vitals:  Vitals:   11/28/23 1330 11/28/23 1345  BP: (!) 123/57 (!) 128/58  Pulse: 63 (!) 59  Resp: 16 15  Temp:    SpO2: 98% 99%    Last Pain:  Vitals:   11/28/23 1345  PainSc: 7                  Librada Castronovo [employer]

## 2023-11-28 NOTE — Anesthesia Procedure Notes (Signed)
 Procedure Name: Intubation Date/Time: 11/28/2023 8:46 AM  Performed by: Pauletta Boroughs, CRNAPre-anesthesia Checklist: Patient identified, Emergency Drugs available, Suction available and Patient being monitored Patient Re-evaluated:Patient Re-evaluated prior to induction Oxygen  Delivery Method: Circle System Utilized Preoxygenation: Pre-oxygenation with 100% oxygen  Induction Type: IV induction and Cricoid Pressure applied Ventilation: Mask ventilation without difficulty Laryngoscope Size: Miller and 2 Grade View: Grade II Tube type: Oral Tube size: 7.0 mm Number of attempts: 1 Airway Equipment and Method: Stylet and Oral airway Placement Confirmation: ETT inserted through vocal cords under direct vision, positive ETCO2 and breath sounds checked- equal and bilateral Tube secured with: Tape Dental Injury: Teeth and Oropharynx as per pre-operative assessment

## 2023-11-28 NOTE — Progress Notes (Signed)
 Pt arrived to unit from PACU. VSS, A/O x 4,  CCMD called ,CHG given, pt oriented to unit,Will continue to monitor. Left groin level 0  Ardella Beaver, RN    11/28/23 1524  Vitals  Temp 97.6 F (36.4 C)  Temp Source Oral  BP (!) 155/76  MAP (mmHg) 99  BP Location Left Arm  BP Method Automatic  Patient Position (if appropriate) Lying  Pulse Rate 64  Pulse Rate Source Monitor  ECG Heart Rate 77  Resp 18  Level of Consciousness  Level of Consciousness Alert  Oxygen  Therapy  SpO2 95 %  O2 Device Nasal Cannula  O2 Flow Rate (L/min) 2 L/min  ECG Monitoring  PR interval 0.18  QRS interval 0.11  QT interval 0.42  QTc interval 0.46  CV Strip Heart Rate 76  Cardiac Rhythm NSR  Telemetry Box Number [id number]  Tele Box Verification Completed by Second Verifier Completed  Pain Assessment  Pain Scale 0-10  Pain Score 5  PCA/Epidural/Spinal Assessment  Respiratory Pattern Regular;Unlabored  Glasgow Coma Scale  Eye Opening 4  Best Verbal Response (NON-intubated) 5  Best Motor Response 6  Glasgow Coma Scale Score 15  MEWS Score  MEWS Temp 0  MEWS Systolic 0  MEWS Pulse 0  MEWS RR 0  MEWS LOC 0  MEWS Score 0  MEWS Score Color Green

## 2023-11-28 NOTE — Anesthesia Procedure Notes (Signed)
 Arterial Line Insertion Start/End1/15/2025 9:00 AM, 11/28/2023 9:05 AM Performed by: CRNA  Patient location: OR. Preanesthetic checklist: patient identified, IV checked, site marked, risks and benefits discussed, surgical consent, monitors and equipment checked, pre-op evaluation, timeout performed and anesthesia consent Right, radial was placed Catheter size: 20 G Hand hygiene performed  and maximum sterile barriers used   Attempts: 2 Procedure performed using ultrasound guided technique. Following insertion, dressing applied and Biopatch. Post procedure assessment: normal  Patient tolerated the procedure well with no immediate complications.

## 2023-11-28 NOTE — Transfer of Care (Signed)
 Immediate Anesthesia Transfer of Care Note  Patient: [name]  Procedure(s) Performed: LEFT COMMON FEMORAL ENDARTERECTOMY (Left) PATCH ANGIOPLASTY (Left) INTRAVASCULAR LITHOTRIPSY PROCEDURE (Left)  Patient Location: PACU  Anesthesia Type:General  Level of Consciousness: awake, alert , and oriented  Airway & Oxygen  Therapy: Patient Spontanous Breathing and Patient connected to nasal cannula oxygen   Post-op Assessment: Report given to RN, Post -op Vital signs reviewed and stable, and Patient moving all extremities X 4  Post vital signs: Reviewed and stable  Last Vitals:  Vitals Value Taken Time  BP 136/65 11/28/23 1245  Temp    Pulse 64 11/28/23 1253  Resp 21 11/28/23 1253  SpO2 99 % 11/28/23 1253  Vitals shown include unfiled device data.  Last Pain:  Vitals:   11/28/23 0727  PainSc: 0-No pain         Complications: No notable events documented.

## 2023-11-28 NOTE — Op Note (Signed)
 Patient name: [name] MRN: [id number] DOB: May 30, 1946 Sex: female  11/28/2023 Pre-operative Diagnosis: Left leg claudication Post-operative diagnosis:  Same Surgeon:  Gareld June Assistants:  Arnaldo Betters, PA Procedure:   #1: Left iliofemoral endarterectomy with bovine pericardial patch angioplasty   #2: Left SFA intra-arterial lithotripsy   #3: Left SFA drug-coated balloon angioplasty   #4: Left leg angiogram Anesthesia:  General Blood Loss:  minimal Specimens:  none  Findings: Heavily calcified exophytic plaque at the femoral bifurcation resulting in approximately 90% stenosis.  The plaque extended up into the external iliac artery.  This was removed for as much as I could visualize.  A bovine pericardial patch was placed.  She also had a heavily calcified left SFA greater than 90% stenosis.  This was treated with intra-arterial lithotripsy using a 5 mm balloon and subsequent 5 mm drug-coated balloon angioplasty.  Indications: This is a 78 year old female with longstanding left leg claudication which is now disabling.  Angiography shows extensive common femoral plaque as well as a high-grade lesion, greater than 90% in the left SFA.  She comes in today for intervention.  Procedure:  The patient was identified in the holding area and taken to [facility] OR ROOM 16  The patient was then placed supine on the table. general anesthesia was administered.  The patient was prepped and draped in the usual sterile fashion.  A time out was called and antibiotics were administered.  A PA was necessary to expedite the procedure and assist with technical details.  She help with exposure by providing suction and retraction.  She help with the anastomosis by following suture.  A oblique incision was made in the left groin.  Cautery was used divide subcutaneous tissue down to the femoral sheath which was opened sharply.  I exposed the common femoral artery from the inguinal ligament down to the  bifurcation.  The superficial I also expose the distal external iliac artery and ligated the crossing circumflex vein.  And 2 large profunda branches were individually isolated.  The femoral artery was heavily calcified.  Once I had adequate exposure, the patient was fully heparinized.  After the heparin  circulated, the vessels were occluded.  A #11 blade was used to make an arteriotomy which was extended longitudinally with Potts scissors.  I extended the arteriotomy down onto the superficial femoral artery for about 1 cm.  A Earlene Gleason elevator was used to perform endarterectomy.  Eversion endarterectomy was performed in the profundofemoral artery.  I was able to get a good distal endpoint on the superficial femoral artery.  I inflated a Fogarty balloon in the distal external iliac artery to remove as much plaque as I could get to.  Once this was completed, all potential embolic debris was removed.  I tested the inflow which was excellent.  Next, a bovine pericardial patch was selected and patch angioplasty was performed with running 5-0 Prolene.  Prior to completion the appropriate flushing maneuvers were performed and the anastomosis was completed.  The clamps were then released sequentially.  There was excellent pulse within the graft.  Next, a micropuncture needle was used to cannulate the femoral patch.  A 018 wire was then inserted in the superficial femoral artery followed by micropuncture sheath.  I then placed a Bentson wire and a 6 [place] sheath.  Next using a 035 quick cross catheter and a Glidewire, with some difficulty, I was ultimately able to cross the lesion in the superficial femoral artery which was heavily  calcified.  Because of the calcium , I did not think primary stenting would be sufficient and so I selected a 5 x 80 shockwave balloon and performed intra-arterial lithotripsy for a total distance of approximately 150 cm.  A total of 300 seconds of pulses were distributed throughout the  superficial femoral artery.  Next, I selected a 5 x 150 drug-coated Ranger balloon and performed drug-coated balloon angioplasty.  This was done for 3 minutes at 6 atm inflation.  Completion imaging revealed resolution of the stenosis with preserved three-vessel runoff.  Next, I removed the sheath from the patch and closed the arteriotomy with a 5-0 Prolene.  The patient had excellent profunda and superficial femoral Doppler signals.  50 mg of protamine  was administered.  Hemostasis was achieved.  The femoral sheath was reapproximated with 2-0 Vicryl.  The subcutaneous tissue was then closed with 2 additional layers of 3-0 Vicryl followed by subcuticular closure with Monocryl.  Dermabond was applied.  The patient was successfully extubated and taken recovery in stable condition.   Disposition: To PACU stable.   Reinaldo Caras, M.D., [facility] [place] Office: [phone] Pager:  [phone]

## 2023-11-29 ENCOUNTER — Other Ambulatory Visit (HOSPITAL_COMMUNITY): Payer: Self-pay

## 2023-11-29 ENCOUNTER — Encounter: Payer: Self-pay | Admitting: Cardiovascular Disease

## 2023-11-29 ENCOUNTER — Encounter (HOSPITAL_COMMUNITY): Payer: Self-pay | Admitting: Surgery

## 2023-11-29 ENCOUNTER — Other Ambulatory Visit: Payer: Self-pay | Admitting: Physician Assistant

## 2023-11-29 LAB — CBC
HCT: 31.2 % — ABNORMAL LOW (ref 36.0–46.0)
Hemoglobin: 10.6 g/dL — ABNORMAL LOW (ref 12.0–15.0)
MCH: 32.1 pg (ref 26.0–34.0)
MCHC: 34 g/dL (ref 30.0–36.0)
MCV: 94.5 fL (ref 80.0–100.0)
Platelets: 139 10*3/uL — ABNORMAL LOW (ref 150–400)
RBC: 3.3 MIL/uL — ABNORMAL LOW (ref 3.87–5.11)
RDW: 13.1 % (ref 11.5–15.5)
WBC: 14.1 10*3/uL — ABNORMAL HIGH (ref 4.0–10.5)
nRBC: 0 % (ref 0.0–0.2)

## 2023-11-29 LAB — BASIC METABOLIC PANEL
Anion gap: 8 (ref 5–15)
BUN: 12 mg/dL (ref 8–23)
CO2: 22 mmol/L (ref 22–32)
Calcium: 8 mg/dL — ABNORMAL LOW (ref 8.9–10.3)
Chloride: 104 mmol/L (ref 98–111)
Creatinine, Ser: 0.63 mg/dL (ref 0.44–1.00)
GFR, Estimated: >60 mL/min (ref >60)
Glucose, Bld: 132 mg/dL — ABNORMAL HIGH (ref 70–99)
Potassium: 3.8 mmol/L (ref 3.5–5.1)
Sodium: 134 mmol/L — ABNORMAL LOW (ref 135–145)

## 2023-11-29 LAB — LIPID PANEL
Cholesterol: 79 mg/dL (ref 0–200)
HDL: 43 mg/dL (ref >40)
LDL Cholesterol: 22 mg/dL (ref 0–99)
Total CHOL/HDL Ratio: 1.8 {ratio}
Triglycerides: 70 mg/dL (ref <150)
VLDL: 14 mg/dL (ref 0–40)

## 2023-11-29 LAB — POCT ACTIVATED CLOTTING TIME
Activated Clotting Time: 302 s
Activated Clotting Time: 239 s
Activated Clotting Time: 222 s

## 2023-11-29 MED ORDER — CLOPIDOGREL BISULFATE 75 MG PO TABS
75.0000 mg | ORAL_TABLET | Freq: Every day | ORAL | 0 refills | Status: DC
  Filled 2023-11-29 – 2023-11-30 (×2): qty 30, 30d supply, fill #0

## 2023-11-29 MED ORDER — OXYCODONE-ACETAMINOPHEN 5-325 MG PO TABS: 1.0000 | ORAL_TABLET | ORAL | 0 refills | Status: DC | PRN

## 2023-11-29 MED ORDER — CLOPIDOGREL BISULFATE 75 MG PO TABS: 75.0000 mg | ORAL_TABLET | Freq: Every day | ORAL | 0 refills | Status: DC

## 2023-11-29 MED ORDER — HYDROMORPHONE HCL 1 MG/ML IJ SOLN: 0.5000 mg | INTRAMUSCULAR | Status: DC | PRN

## 2023-11-29 MED ORDER — OXYCODONE-ACETAMINOPHEN 5-325 MG PO TABS
1.0000 | ORAL_TABLET | Freq: Four times a day (QID) | ORAL | 0 refills | Status: DC | PRN
  Filled 2023-11-29: qty 30, 7d supply, fill #0

## 2023-11-29 NOTE — TOC Transition Note (Signed)
Transition of Care (TOC) - Discharge Note Donn Pierini RN, BSN Transitions of Care Unit 4E- RN Case Manager See Treatment Team for direct phone #   Patient Details  Name: ANACRISTINA PORTANOVA MRN: [id number] Date of Birth: [birth year]  Transition of Care [facility]) CM/SW Contact:  Darrold Span, RN Phone Number: 11/29/2023, 2:22 PM   Clinical Narrative:    Pt stable for transition home today, Per PT eval- no noted HH or DME needs. Cm received notification from [facility] liaison that V[facility] office made referral for [facility] needs- liaison has spoken with pt and pt indicated she would be staying with daughter Address: [address] [place] [address] Pt open to [facility].  [facility] will follow up with pt for [facility] needs and schedule if needed per office protocol.   Daughter to transport home, No further TOC needs noted.    Final next level of care: Home w Home Health Services Barriers to Discharge: No Barriers Identified   Patient Goals and CMS Choice Patient states their goals for this hospitalization and ongoing recovery are:: return home   Choice offered to / list presented to :  ([facility] office referral)      Discharge Placement                 Home w/ [facility]      Discharge Plan and Services Additional resources added to the After Visit Summary for     Discharge Planning Services: CM Consult Post Acute Care Choice: Home Health          DME Arranged: N/A DME Agency: NA         HH Agency: [facility] Date HH Agency Contacted: 11/29/23 Time HH Agency Contacted: 1422 Representative spoke with at [facility] Agency: Aggie Cosier  Social Drivers of Health (SDOH) Interventions SDOH Screenings   Food Insecurity: Patient Declined (11/29/2023)  Housing: Patient Declined (11/29/2023)  Transportation Needs: Patient Declined (11/29/2023)  Utilities: Not At Risk (11/29/2023)  Depression (PHQ2-9): Low Risk  (12/25/2018)  Financial Resource Strain: Patient Declined (09/13/2023)   Received  from [facility]  Social Connections: Patient Declined (11/29/2023)  Tobacco Use: Medium Risk (11/28/2023)     Readmission Risk Interventions    11/29/2023    2:22 PM  Readmission Risk Prevention Plan  Transportation Screening Complete  Home Care Screening Complete  Medication Review (RN CM) Complete

## 2023-11-29 NOTE — Progress Notes (Signed)
OT Cancellation Note  Patient Details Name: [name] MRN: [id number] DOB: 1946-09-28   Cancelled Treatment:    Reason Eval/Treat Not Completed: OT screened, no needs identified, will sign off  Galen Manila 11/29/2023, 11:06 AM

## 2023-11-29 NOTE — Progress Notes (Signed)
PHARMACIST LIPID MONITORING   [name] is a 78 y.o. female admitted on 11/28/2023 with left leg claudication.  Pharmacy has been consulted to optimize lipid-lowering therapy with the indication of secondary prevention for clinical ASCVD.  Recent Labs:  Lipid Panel (last 6 months):   Lab Results  Component Value Date   CHOL 79 11/29/2023   TRIG 70 11/29/2023   HDL 43 11/29/2023   CHOLHDL 1.8 11/29/2023   VLDL 14 11/29/2023   LDLCALC 22 11/29/2023    Hepatic function panel (last 6 months):   Lab Results  Component Value Date   AST 31 11/22/2023   ALT 28 11/22/2023   ALKPHOS 45 11/22/2023   BILITOT 0.6 11/22/2023   01/02/23 Lipoprotein (a) 234.6  SCr (since admission):   Serum creatinine: 0.63 mg/dL 16/10/96 0454 Estimated creatinine clearance: 53.7 mL/min  Current therapy and lipid therapy tolerance Current lipid-lowering therapy: Crestor 20 + Zetia Previous lipid-lowering therapies (if applicable): n/a Documented or reported allergies or intolerances to lipid-lowering therapies (if applicable): n/a  Assessment:   Patient is already followed at lipid clinic - started on Leqvio 08/27/23. Lipid clinic working with patient on cost/approval for possible switch to Repatha as outpatient.   Plan: Continue follow-up with lipid clinic. No further additions to be made at this time.   Link Snuffer, PharmD, BCPS, BCCCP Please refer to [facility] for [facility] Pharmacy numbers 11/29/2023, 7:27 AM

## 2023-11-29 NOTE — Discharge Instructions (Signed)
 [facility] [place]  Discharge instructions  Lower Extremity Bypass Surgery  Please refer to the following instruction for your post-procedure care. Your surgeon or physician assistant will discuss any changes with you.  Activity  You are encouraged to walk as much as you can. You can slowly return to normal activities during the month after your surgery. Avoid strenuous activity and heavy lifting until your doctor tells you it's OK. Avoid activities such as vacuuming or swinging a golf club. Do not drive until your doctor give the OK and you are no longer taking prescription pain medications. It is also normal to have difficulty with sleep habits, eating and bowel movement after surgery. These will go away with time.  Bathing/Showering  You may shower after you go home. Do not soak in a bathtub, hot tub, or swim until the incision heals completely.  Incision Care  Clean your incision with mild soap and water. Shower every day. Pat the area dry with a clean towel. You do not need a bandage unless otherwise instructed. Do not apply any ointments or creams to your incision. If you have open wounds you will be instructed how to care for them or a visiting nurse may be arranged for you. If you have staples or sutures along your incision they will be removed at your post-op appointment. You may have skin glue on your incision. Do not peel it off. It will come off on its own in about one week. If you have a great deal of moisture in your groin, use a gauze help keep this area dry.  Diet  Resume your normal diet. There are no special food restrictions following this procedure. A low fat/ low cholesterol diet is recommended for all patients with vascular disease. In order to heal from your surgery, it is CRITICAL to get adequate nutrition. Your body requires vitamins, minerals, and protein. Vegetables are the best source of vitamins and minerals. Vegetables also provide the  perfect balance of protein. Processed food has little nutritional value, so try to avoid this.  Medications  Resume taking all your medications unless your doctor or nurse practitioner tells you not to. If your incision is causing pain, you may take over-the-counter pain relievers such as acetaminophen (Tylenol). If you were prescribed a stronger pain medication, please aware these medication can cause nausea and constipation. Prevent nausea by taking the medication with a snack or meal. Avoid constipation by drinking plenty of fluids and eating foods with high amount of fiber, such as fruits, vegetables, and grains. Take Colase 100 mg (an over-the-counter stool softener) twice a day as needed for constipation. Do not take Tylenol if you are taking prescription pain medications.  Follow Up  Our office will schedule a follow up appointment 2-3 weeks following discharge.  Please call us immediately for any of the following conditions  Severe or worsening pain in your legs or feet while at rest or while walking Increase pain, redness, warmth, or drainage (pus) from your incision site(s) Fever of 101 degree or higher The swelling in your leg with the bypass suddenly worsens and becomes more painful than when you were in the hospital If you have been instructed to feel your graft pulse then you should do so every day. If you can no longer feel this pulse, call the office immediately. Not all patients are given this instruction.  Leg swelling is common after leg bypass surgery.  The swelling should improve over a few months   following surgery. To improve the swelling, you may elevate your legs above the level of your heart while you are sitting or resting. Your surgeon or physician assistant may ask you to apply an ACE wrap or wear compression (TED) stockings to help to reduce swelling.  Reduce your risk of vascular disease  Stop smoking. If you would like help call [facility] at 1-800-QUIT-NOW  (1-800-784-8669) or [place] at 336-586-4000.  Manage your cholesterol Maintain a desired weight Control your diabetes weight Control your diabetes Keep your blood pressure down  If you have any questions, please call the office at [phone]   

## 2023-11-29 NOTE — Progress Notes (Addendum)
[facility] [place]  Subjective  - doing well she has walked to the bathroom.   Objective 124/60 71 97.8 F (36.6 C) (Oral) 17 99%  Intake/Output Summary (Last 24 hours) at 11/29/2023 0739 Last data filed at 11/29/2023 7829 Gross per 24 hour  Intake 1550 ml  Output 1350 ml  Net 200 ml   Doppler biphasic DP left Left groin soft with ecchymosis, without hematoma Lungs non labored breathing Heart RRR Ambulating   Assessment/Planning: POD # 1  S/P Left femoral artery endarterectomy and lithotripsy/angioplasty distal SFA stenosis   Improved inflow Left LE Doppler biphasic DP Will discharge on ASA, Plavix for 1 month and Eliquis for Afib.   F/U in 2-3 weeks for incision check   [name] 11/29/2023 7:39 AM --  Laboratory Lab Results: Recent Labs    11/28/23 1610 11/29/23 0430  WBC 18.1* 14.1*  HGB 12.1 10.6*  HCT 34.9* 31.2*  PLT 155 139*   BMET Recent Labs    11/28/23 1610 11/29/23 0430  NA  --  134*  K  --  3.8  CL  --  104  CO2  --  22  GLUCOSE  --  132*  BUN  --  12  CREATININE 0.62 0.63  CALCIUM  --  8.0*    COAG Lab Results  Component Value Date   INR 1.1 11/28/2023   INR 1.9 (H) 11/22/2023   INR 2.1 11/21/2023   No results found for: "PTT"

## 2023-11-29 NOTE — Evaluation (Signed)
Physical Therapy Brief Evaluation and Discharge Note Patient Details Name: [name] MRN: [id number] DOB: 1946-10-18 Today's Date: 11/29/2023   History of Present Illness  Pt is 78 year old presented to [facility] on  11/28/23 for LLE endarterectomy and angioplasty for lt leg claudication. PMH - CABG, COPD, lung CA, afib, stenting bilateral common iliac arteries, Sjogren's  Clinical Impression  Pt doing well with mobility and no further PT needed.  Ready for dc from PT standpoint. Instructed in repeated sit to stand and walking backwards with support of countertop for continued strengthening and balance.         PT Assessment Patient does not need any further PT services  Assistance Needed at Discharge  PRN    Equipment Recommendations None recommended by PT  Recommendations for Other Services       Precautions/Restrictions Precautions Precautions: None Restrictions Weight Bearing Restrictions Per Provider Order: No        Mobility  Bed Mobility       General bed mobility comments: up in chair  Transfers Overall transfer level: Modified independent Equipment used: None                    Ambulation/Gait Ambulation/Gait assistance: Modified independent (Device/Increase time) Gait Distance (Feet): 350 Feet Assistive device: None Gait Pattern/deviations: Step-through pattern, Decreased stride length Gait Speed: Below normal General Gait Details: Steady gait  Home Activity Instructions    Stairs Stairs: Yes Stairs assistance: Supervision Stair Management: One rail Left, Forwards, Step to pattern Number of Stairs: 3    Modified Rankin (Stroke Patients Only)        Balance Overall balance assessment: Mild deficits observed, not formally tested                        Pertinent Vitals/Pain   Pain Assessment Pain Assessment: Faces Faces Pain Scale: Hurts a little bit Pain Location: LLE Pain Descriptors / Indicators: Sore Pain  Intervention(s): Limited activity within patient's tolerance     Home Living Family/patient expects to be discharged to:: Private residence Living Arrangements: Children Available Help at Discharge: Family Home Environment: Stairs to enter;Rail - right  Landscape architect of Steps: 3 Home Equipment: None        Prior Function Level of Independence: Independent      UE/LE Assessment   UE ROM/Strength/Tone/Coordination: WFL    LE ROM/Strength/Tone/Coordination: [facility]      Communication   Communication Communication: No apparent difficulties     Cognition Overall Cognitive Status: Appears within functional limits for tasks assessed/performed       General Comments      Exercises     Assessment/Plan    PT Problem List         PT Visit Diagnosis Other abnormalities of gait and mobility (R26.89)    No Skilled PT Patient is modified independent with all activity/mobility   Co-evaluation                AMPAC 6 Clicks Help needed turning from your back to your side while in a flat bed without using bedrails?: None Help needed moving from lying on your back to sitting on the side of a flat bed without using bedrails?: None Help needed moving to and from a bed to a chair (including a wheelchair)?: None Help needed standing up from a chair using your arms (e.g., wheelchair or bedside chair)?: None Help needed to walk in hospital room?: None Help needed climbing  3-5 steps with a railing? : A Little 6 Click Score: 23      End of Session   Activity Tolerance: Patient tolerated treatment well Patient left: in chair;with call bell/phone within reach Nurse Communication: Mobility status PT Visit Diagnosis: Other abnormalities of gait and mobility (R26.89)     Time: 4782-9562 PT Time Calculation (min) (ACUTE ONLY): 14 min  Charges:   PT Evaluation $PT Eval Low Complexity: 1 Low      [facility] PT Acute Rehabilitation Services Office  [phone]   Angelina Ok [facility]  11/29/2023, 10:34 AM

## 2023-11-30 ENCOUNTER — Encounter (HOSPITAL_COMMUNITY): Payer: Medicare Other

## 2023-11-30 ENCOUNTER — Other Ambulatory Visit (HOSPITAL_COMMUNITY): Payer: Self-pay

## 2023-11-30 ENCOUNTER — Telehealth: Payer: Self-pay

## 2023-11-30 ENCOUNTER — Encounter: Payer: Self-pay | Admitting: Cardiovascular Disease

## 2023-11-30 ENCOUNTER — Encounter: Payer: Self-pay | Admitting: Surgery

## 2023-11-30 DIAGNOSIS — Z7982 Long term (current) use of aspirin: Secondary | ICD-10-CM | POA: Diagnosis not present

## 2023-11-30 DIAGNOSIS — I4892 Unspecified atrial flutter: Secondary | ICD-10-CM | POA: Diagnosis not present

## 2023-11-30 DIAGNOSIS — J449 Chronic obstructive pulmonary disease, unspecified: Secondary | ICD-10-CM | POA: Diagnosis not present

## 2023-11-30 DIAGNOSIS — Z79899 Other long term (current) drug therapy: Secondary | ICD-10-CM | POA: Diagnosis not present

## 2023-11-30 DIAGNOSIS — Z7901 Long term (current) use of anticoagulants: Secondary | ICD-10-CM | POA: Diagnosis not present

## 2023-11-30 DIAGNOSIS — I7090 Unspecified atherosclerosis: Secondary | ICD-10-CM | POA: Diagnosis not present

## 2023-11-30 DIAGNOSIS — G8929 Other chronic pain: Secondary | ICD-10-CM | POA: Diagnosis not present

## 2023-11-30 DIAGNOSIS — F419 Anxiety disorder, unspecified: Secondary | ICD-10-CM | POA: Diagnosis not present

## 2023-11-30 DIAGNOSIS — I10 Essential (primary) hypertension: Secondary | ICD-10-CM | POA: Diagnosis not present

## 2023-11-30 DIAGNOSIS — M549 Dorsalgia, unspecified: Secondary | ICD-10-CM | POA: Diagnosis not present

## 2023-11-30 DIAGNOSIS — Z85118 Personal history of other malignant neoplasm of bronchus and lung: Secondary | ICD-10-CM | POA: Diagnosis not present

## 2023-11-30 DIAGNOSIS — Z87891 Personal history of nicotine dependence: Secondary | ICD-10-CM | POA: Diagnosis not present

## 2023-11-30 DIAGNOSIS — I2510 Atherosclerotic heart disease of native coronary artery without angina pectoris: Secondary | ICD-10-CM | POA: Diagnosis not present

## 2023-11-30 DIAGNOSIS — I480 Paroxysmal atrial fibrillation: Secondary | ICD-10-CM | POA: Diagnosis not present

## 2023-11-30 DIAGNOSIS — E7800 Pure hypercholesterolemia, unspecified: Secondary | ICD-10-CM | POA: Diagnosis not present

## 2023-11-30 DIAGNOSIS — M3500 Sicca syndrome, unspecified: Secondary | ICD-10-CM | POA: Diagnosis not present

## 2023-11-30 DIAGNOSIS — M5030 Other cervical disc degeneration, unspecified cervical region: Secondary | ICD-10-CM | POA: Diagnosis not present

## 2023-11-30 DIAGNOSIS — I739 Peripheral vascular disease, unspecified: Secondary | ICD-10-CM | POA: Diagnosis not present

## 2023-11-30 DIAGNOSIS — Z48812 Encounter for surgical aftercare following surgery on the circulatory system: Secondary | ICD-10-CM | POA: Diagnosis not present

## 2023-11-30 NOTE — Telephone Encounter (Signed)
Returned pt's [facility] nurse Marchelle Folks, with [facility] call regarding pt's leg below incision. They attached a picture of area below incision, which looks red and APP thought it may be some kind of allergic reaction to Chlorhexadine. Pt states she has very sensitive skin and reacts to a lot of things. She has been advised to keep area clean and dry and apply Hydrocortisone ointment to it, but not on incision. Pt verbalized understanding and will call us back should anything change/worsen.

## 2023-11-30 NOTE — Telephone Encounter (Signed)
Caller: Pt  Concern: mild nose bleeding, dried blood  Location: nose  Description:  noticed today  Treatments:  Instructed pt to use saline nasal spray, humidifier in bedroom, manual pressure and ice on nose if heavier bleeding occurs  Resolution: Instructed to call back if symptoms perist

## 2023-12-03 ENCOUNTER — Encounter: Payer: Self-pay | Admitting: Surgery

## 2023-12-03 ENCOUNTER — Ambulatory Visit (INDEPENDENT_AMBULATORY_CARE_PROVIDER_SITE_OTHER): Payer: Medicare Other | Admitting: Surgery

## 2023-12-03 ENCOUNTER — Other Ambulatory Visit (HOSPITAL_COMMUNITY): Payer: Self-pay

## 2023-12-03 VITALS — BP 131/76 | HR 73 | Temp 98.0°F | Resp 20 | Wt 153.0 lb

## 2023-12-03 DIAGNOSIS — I4892 Unspecified atrial flutter: Secondary | ICD-10-CM | POA: Diagnosis not present

## 2023-12-03 DIAGNOSIS — I480 Paroxysmal atrial fibrillation: Secondary | ICD-10-CM | POA: Diagnosis not present

## 2023-12-03 DIAGNOSIS — I70213 Atherosclerosis of native arteries of extremities with intermittent claudication, bilateral legs: Secondary | ICD-10-CM

## 2023-12-03 DIAGNOSIS — J449 Chronic obstructive pulmonary disease, unspecified: Secondary | ICD-10-CM | POA: Diagnosis not present

## 2023-12-03 DIAGNOSIS — I739 Peripheral vascular disease, unspecified: Secondary | ICD-10-CM | POA: Diagnosis not present

## 2023-12-03 DIAGNOSIS — Z48812 Encounter for surgical aftercare following surgery on the circulatory system: Secondary | ICD-10-CM | POA: Diagnosis not present

## 2023-12-03 DIAGNOSIS — M3500 Sicca syndrome, unspecified: Secondary | ICD-10-CM | POA: Diagnosis not present

## 2023-12-03 NOTE — Progress Notes (Signed)
Patient name: [name] MRN: [id number] DOB: 05-Jul-1946 Sex: female  REASON FOR VISIT:    Postop add on  HISTORY OF PRESENT ILLNESS:   DAESIA CREEDEN is a 78 y.o. female who suffers from left claudication.  She has undergone bilateral iliac stenting by Dr. Kirke Corin.  She was found to have heavily calcified common femoral disease on the left and a high-grade left superficial femoral artery stenosis.  On 11/28/2023 she underwent left iliofemoral endarterectomy with pericardial patch angioplasty followed by left SFA lithotripsy and drug-coated balloon angioplasty.  She called in with a rash below her incision as well as nosebleed.  We plan for triple therapy for a month and then switching to Eliquis and aspirin.   The patient suffers from atrial flutter. She has a history of coronary artery disease, status post CABG. She does have COPD secondary to tobacco abuse. She is a former smoker. She has a history of lung cancer treated surgically in 2018 she is on Coumadin for atrial fibrillation. She takes Crestor for hypercholesterolemia.    CURRENT MEDICATIONS:    Current Outpatient Medications  Medication Sig Dispense Refill   acetaminophen (TYLENOL) 500 MG tablet Take 500-1,000 mg by mouth every 8 (eight) hours as needed for mild pain (pain score 1-3), moderate pain (pain score 4-6) or headache.     albuterol (VENTOLIN HFA) 108 (90 Base) MCG/ACT inhaler Inhale 2 puffs into the lungs every 6 (six) hours as needed for wheezing or shortness of breath. 8 g 5   apixaban (ELIQUIS) 5 MG TABS tablet Take 1 tablet (5 mg total) by mouth 2 (two) times daily. 60 tablet 11   aspirin EC 81 MG tablet Take 81 mg by mouth daily. Swallow whole.  Pt started taking when stopped COUMADIN on 11/21/22     Calcium Carbonate (CALTRATE 600 PO) Take 600 mg by mouth daily.     Carboxymethylcellulose Sodium (THERATEARS) 0.25 % SOLN Place 1-2 drops into both eyes 3 (three) times  daily.     Cholecalciferol (VITAMIN D) 50 MCG (2000 UT) CAPS Take 2,000 Units by mouth daily.     clopidogrel (PLAVIX) 75 MG tablet Take 1 tablet (75 mg total) by mouth daily. 30 tablet 0   Coenzyme Q10 100 MG TABS Take 100 mg by mouth daily.     docusate sodium (COLACE) 100 MG capsule Take 100 mg by mouth daily.     ezetimibe (ZETIA) 10 MG tablet Take 1 tablet (10 mg total) by mouth daily. 90 tablet 3   fluticasone (FLONASE) 50 MCG/ACT nasal spray Place 2 sprays into both nostrils daily as needed for rhinitis.     hydrocortisone valerate cream (WESTCORT) 0.2 % Apply 1 application  topically 3 (three) times a week. Right ear  for eczema     inclisiran (LEQVIO) 284 MG/1.5ML SOSY injection Inject 284 mg into the skin every 6 (six) months.     isosorbide mononitrate (IMDUR) 30 MG 24 hr tablet Take 1 tablet (30 mg total) by mouth daily. 90 tablet 3   lisinopril (ZESTRIL) 20 MG tablet Take 1 tablet (20 mg total) by mouth in the morning and at bedtime. 180 tablet 3   metoprolol succinate (TOPROL-XL) 25 MG 24 hr tablet Take 1 tablet (25 mg total) by mouth daily. 90 tablet 3   metoprolol tartrate (LOPRESSOR) 25 MG tablet Take 25 mg by mouth daily as needed (Afib/ a flutter).     nitroGLYCERIN (NITROSTAT) 0.4 MG SL tablet Place 1 tablet (0.4 mg  total) under the tongue every 5 (five) minutes x 3 doses as needed for chest pain. 25 tablet 2   oxyCODONE-acetaminophen (PERCOCET/ROXICET) 5-325 MG tablet Take 1 tablet by mouth every 6 (six) hours as needed (7-8 severe pain). 30 tablet 0   pyridOXINE (VITAMIN B-6) 100 MG tablet Take 100 mg by mouth daily.     rosuvastatin (CRESTOR) 20 MG tablet Take 1 tablet by mouth once daily 90 tablet 3   umeclidinium-vilanterol (ANORO ELLIPTA) 62.5-25 MCG/ACT AEPB Inhale 1 puff by mouth once daily 60 each 11   valACYclovir (VALTREX) 500 MG tablet Take 500 mg by mouth every evening.     vitamin B-12 (CYANOCOBALAMIN) 1000 MCG tablet Take 1,000 mcg by mouth daily.     No  current facility-administered medications for this visit.    REVIEW OF SYSTEMS:   [X]  denotes positive finding, [ ]  denotes negative finding Cardiac  Comments:  Chest pain or chest pressure:    Shortness of breath upon exertion:    Short of breath when lying flat:    Irregular heart rhythm:    Constitutional    Fever or chills:      PHYSICAL EXAM:   Vitals:   12/03/23 1141  BP: 131/76  Pulse: 73  Resp: 20  Temp: 98 F (36.7 C)  SpO2: 97%  Weight: 153 lb (69.4 kg)    GENERAL: The patient is a well-nourished female, in no acute distress. The vital signs are documented above. CARDIOVASCULAR: There is a regular rate and rhythm. PULMONARY: Non-labored respirations Left groin incision is healing nicely.  She does have some resolving hematoma and ecchymosis.  The rash appears to be resolved  STUDIES:   None   MEDICAL ISSUES:   Overall, I am very pleased with her progress.  She has a palpable posterior tibial pulse on the left.  The groin incision is healing nicely.  I do not see any concern for her rash.  There is no evidence of infection.  I will have her follow-up for surveillance imaging in 3 months  She is having nosebleeds and so I will stop her Plavix today.  She will continue 81 mg aspirin and Eliquis.  She will reestablish follow-up with Dr. Kirke Corin as well  Durene Cal, IV, MD, [facility] [phone] Pager [phone]

## 2023-12-04 ENCOUNTER — Encounter: Payer: Self-pay | Admitting: Surgery

## 2023-12-04 ENCOUNTER — Other Ambulatory Visit: Payer: Self-pay

## 2023-12-04 MED ORDER — EZETIMIBE 10 MG PO TABS: 10.0000 mg | ORAL_TABLET | Freq: Every day | ORAL | 3 refills | Status: DC

## 2023-12-05 ENCOUNTER — Ambulatory Visit: Payer: Medicare Other
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
## 2023-12-06 ENCOUNTER — Telehealth: Payer: Self-pay

## 2023-12-06 DIAGNOSIS — J449 Chronic obstructive pulmonary disease, unspecified: Secondary | ICD-10-CM | POA: Diagnosis not present

## 2023-12-06 DIAGNOSIS — I739 Peripheral vascular disease, unspecified: Secondary | ICD-10-CM | POA: Diagnosis not present

## 2023-12-06 DIAGNOSIS — Z48812 Encounter for surgical aftercare following surgery on the circulatory system: Secondary | ICD-10-CM | POA: Diagnosis not present

## 2023-12-06 DIAGNOSIS — M3500 Sicca syndrome, unspecified: Secondary | ICD-10-CM | POA: Diagnosis not present

## 2023-12-06 DIAGNOSIS — I480 Paroxysmal atrial fibrillation: Secondary | ICD-10-CM | POA: Diagnosis not present

## 2023-12-06 DIAGNOSIS — I4892 Unspecified atrial flutter: Secondary | ICD-10-CM | POA: Diagnosis not present

## 2023-12-06 NOTE — Telephone Encounter (Signed)
Physical therapist Angie @ [facility]  requested 1 week x 3 sessions

## 2023-12-06 NOTE — Telephone Encounter (Signed)
PT Angie @ [facility] called for orders on PT treatment plan

## 2023-12-10 DIAGNOSIS — R8290 Unspecified abnormal findings in urine: Secondary | ICD-10-CM | POA: Diagnosis not present

## 2023-12-11 DIAGNOSIS — I4892 Unspecified atrial flutter: Secondary | ICD-10-CM | POA: Diagnosis not present

## 2023-12-11 DIAGNOSIS — J449 Chronic obstructive pulmonary disease, unspecified: Secondary | ICD-10-CM | POA: Diagnosis not present

## 2023-12-11 DIAGNOSIS — I739 Peripheral vascular disease, unspecified: Secondary | ICD-10-CM | POA: Diagnosis not present

## 2023-12-11 DIAGNOSIS — I480 Paroxysmal atrial fibrillation: Secondary | ICD-10-CM | POA: Diagnosis not present

## 2023-12-11 DIAGNOSIS — Z48812 Encounter for surgical aftercare following surgery on the circulatory system: Secondary | ICD-10-CM | POA: Diagnosis not present

## 2023-12-11 DIAGNOSIS — M3500 Sicca syndrome, unspecified: Secondary | ICD-10-CM | POA: Diagnosis not present

## 2023-12-12 ENCOUNTER — Other Ambulatory Visit: Payer: Self-pay

## 2023-12-12 ENCOUNTER — Telehealth: Payer: Self-pay | Admitting: Cardiovascular Disease

## 2023-12-12 ENCOUNTER — Encounter: Payer: Self-pay | Admitting: Cardiovascular Disease

## 2023-12-12 ENCOUNTER — Encounter: Payer: Self-pay | Admitting: Pulmonary Disease

## 2023-12-12 ENCOUNTER — Ambulatory Visit: Payer: Medicare Other

## 2023-12-12 VITALS — BP 147/68 | HR 65 | Temp 97.5°F | Resp 16 | Ht 63.0 in | Wt 156.0 lb

## 2023-12-12 DIAGNOSIS — E785 Hyperlipidemia, unspecified: Secondary | ICD-10-CM

## 2023-12-12 DIAGNOSIS — I2510 Atherosclerotic heart disease of native coronary artery without angina pectoris: Secondary | ICD-10-CM

## 2023-12-12 DIAGNOSIS — I739 Peripheral vascular disease, unspecified: Secondary | ICD-10-CM

## 2023-12-12 MED ORDER — LISINOPRIL 20 MG PO TABS: 20.0000 mg | ORAL_TABLET | Freq: Two times a day (BID) | ORAL | 3 refills | Status: DC

## 2023-12-12 MED ORDER — INCLISIRAN SODIUM 284 MG/1.5ML ~~LOC~~ SOSY: 284.0000 mg | PREFILLED_SYRINGE | Freq: Once | SUBCUTANEOUS | Status: DC

## 2023-12-12 NOTE — Telephone Encounter (Signed)
Patient had surgery 2 weeks ago. Was scheduled for Leqvio injection today but declined due to her LDL being at 22.  Plans to start Repatha in about 3 months. Knows that PA was approved.

## 2023-12-12 NOTE — Progress Notes (Signed)
Patient presented today for second Leqvio injection appointment. Patient requested information about her recent lipid panel results drawn November 29, 2023. Results read verbatim from patient's chart and patient also discussed with pharmacist Ihor Austin, [facility]. Patient expressed concern that second injection might lower her lipid levels too much and requested that nursing staff contact cardiologist's office to confirm ok to give Leqvio today. This RN called ordering provider's office [facility] [place], [phone]) and spoke with Jasmine December, RN. Jasmine December relayed question to cardiology pharmacist, Cheree Ditto, [facility]. Per pharmacist, ok to give second dose of Leqvio today if patient desired; pharmacist also reiterated that patient was also eligible for Repatha. Message relayed to patient, who verbalized understanding. Patient decided that she would prefer to speak with her cardiologist before receiving second dose of Leqvio. No injection administered today.  Wyvonne Lenz, RN

## 2023-12-12 NOTE — Telephone Encounter (Signed)
leqvio 2nd dose 1st lowered lipid  ldl 22 11/29/23

## 2023-12-12 NOTE — Telephone Encounter (Signed)
Pt c/o medication issue:  1. Name of Medication:   inclisiran (LEQVIO) 284 MG/1.5ML SOSY injection   2. How are you currently taking this medication (dosage and times per day)?   3. Are you having a reaction (difficulty breathing--STAT)?   4. What is your medication issue?   Patient is following-up on this medication.

## 2023-12-12 NOTE — Telephone Encounter (Signed)
Erin  at infusion  center is calling  for patient . Patient is there to get second  infusion. Patient wanted to know if she should do the infusion today.   RN reviewed  information with   Laural Golden, RPH. Per Thayer Ohm, aware of LDL . The infusion will  reduce LDL so more ,but it is up to patient if she would like to continue with the infusion , if she decide not ,she can start the Repatha  ( per Thayer Ohm it has been approve) the patient will have to let us know which decide.  Denny Peon states she understands and will relay message to patient.

## 2023-12-14 DIAGNOSIS — Z856 Personal history of leukemia: Secondary | ICD-10-CM | POA: Diagnosis not present

## 2023-12-14 DIAGNOSIS — Z1231 Encounter for screening mammogram for malignant neoplasm of breast: Secondary | ICD-10-CM | POA: Diagnosis not present

## 2023-12-14 MED ORDER — REPATHA SURECLICK 140 MG/ML ~~LOC~~ SOAJ: 1.0000 mL | SUBCUTANEOUS | 1 refills | Status: DC

## 2023-12-14 NOTE — Addendum Note (Signed)
Addended by: Cheree Ditto on: 12/14/2023 10:20 AM   Modules accepted: Orders

## 2023-12-15 DIAGNOSIS — Z23 Encounter for immunization: Secondary | ICD-10-CM | POA: Diagnosis not present

## 2023-12-20 ENCOUNTER — Encounter: Payer: Self-pay | Admitting: Pharmacist

## 2023-12-21 ENCOUNTER — Other Ambulatory Visit: Payer: Self-pay | Admitting: Surgery

## 2023-12-21 DIAGNOSIS — C3432 Malignant neoplasm of lower lobe, left bronchus or lung: Secondary | ICD-10-CM

## 2023-12-25 ENCOUNTER — Other Ambulatory Visit: Payer: Self-pay

## 2023-12-25 DIAGNOSIS — I70213 Atherosclerosis of native arteries of extremities with intermittent claudication, bilateral legs: Secondary | ICD-10-CM

## 2023-12-31 ENCOUNTER — Encounter: Payer: Self-pay | Admitting: Surgery

## 2024-01-03 ENCOUNTER — Telehealth: Payer: Self-pay

## 2024-01-03 NOTE — Telephone Encounter (Signed)
Triage/Advice: -response to pt message in MyChart, referencing a new rash since surgery which she thinks could be related to Eliquis.  Pt reports it started the day after getting home.  The rash incorporates approx 50% of her thigh, that it was bright red but now has calmed down.  It as not spread in area nor in any other area.  Symptomatically she is experiencing a skin burning which could be associated with a soap or lotion which she is advised to use a mild soap, no lotions, or creams.  She started using a mild body soap and will try some hydrocortisone to see if it gets better.   -pt understands that if rash does not continue to improve to call the office

## 2024-01-09 ENCOUNTER — Encounter: Payer: Self-pay | Admitting: Surgery

## 2024-01-10 ENCOUNTER — Ambulatory Visit (INDEPENDENT_AMBULATORY_CARE_PROVIDER_SITE_OTHER): Payer: Medicare Other

## 2024-01-10 ENCOUNTER — Encounter: Payer: Self-pay | Admitting: Surgery

## 2024-01-10 VITALS — BP 173/75 | HR 55 | Temp 97.2°F | Ht 63.0 in | Wt 156.0 lb

## 2024-01-10 DIAGNOSIS — I70213 Atherosclerosis of native arteries of extremities with intermittent claudication, bilateral legs: Secondary | ICD-10-CM

## 2024-01-10 NOTE — Progress Notes (Signed)
 POST OPERATIVE OFFICE NOTE    CC:  F/u for surgery  HPI:  This is a 78 y.o. female who is s/p left Iliofemoral endarterectomy with bovine pericardial patch angioplasty, left SFA intra-arterial lithotripsy, left SFA DCB angioplasty and LLE Angiogram on 11/28/23 by Dr. Myra Gianotti.   This was performed for LLE disabling claudication.   Pt returns today as triage visit with concerns about her left groin incision feeling " rope like". She says she had not really noticed it until last week once the Dermabond finally came off. It has not been tender. She otherwise has been very pleased with how she has healed. She says she has not been ambulating much but did walk 1/2 mile yesterday and had no pain on ambulation in her legs. She did have to stop 1 time but that was due to being short winded. She is hopeful to slowly build back up her walking regimen. She was on triple therapy with Eliquis, Plavix and Aspirin but due to some nose bleeding she was advised to stop the Plavix. She is therefore medically managed on Eliquis, Aspirin, statin and Repatha.   Allergies  Allergen Reactions   Amiodarone Anaphylaxis and Other (See Comments)    Angioedema, swelling of throat and tongue   Amlodipine Other (See Comments)    Pt states that she just can not tolerate, states blood pressure goes up and down and experiences leg heaviness.   Clindamycin/Lincomycin Swelling    TROUBLE SWALLOWING......SEVERE CHEST PAIN   Doxycycline Other (See Comments)    blistering   Sulfa Antibiotics Rash and Other (See Comments)    Red, burning rash & paralysis Burning Rash   Lipitor [Atorvastatin] Other (See Comments)    MYALGIAS LEG PAIN   Phenergan [Promethazine Hcl] Other (See Comments)    Nervous Leg / Restless Leg Syndrome   Reclast [Zoledronic Acid] Other (See Comments)    Flu symptoms - made pt very sick, and had inflammation in her eye   Carvedilol Other (See Comments)    Per patient was "wiped out"  for 2 days after  taking     Cephalexin     Unknown reaction    Dofetilide     Muscle Pain Lumbar pain and effects walking   Ketorolac Nausea Only    headache   Diltiazem Other (See Comments)    Weakness on oral Dilt, tolerating 120 mg currently   Latex Rash    Current Outpatient Medications  Medication Sig Dispense Refill   albuterol (VENTOLIN HFA) 108 (90 Base) MCG/ACT inhaler Inhale 2 puffs into the lungs every 6 (six) hours as needed for wheezing or shortness of breath. 8 g 5   apixaban (ELIQUIS) 5 MG TABS tablet Take 1 tablet (5 mg total) by mouth 2 (two) times daily. 60 tablet 11   aspirin EC 81 MG tablet Take 81 mg by mouth daily. Swallow whole.  Pt started taking when stopped COUMADIN on 11/21/22     Calcium Carbonate (CALTRATE 600 PO) Take 600 mg by mouth daily.     Carboxymethylcellulose Sodium (THERATEARS) 0.25 % SOLN Place 1-2 drops into both eyes 3 (three) times daily.     Cholecalciferol (VITAMIN D) 50 MCG (2000 UT) CAPS Take 2,000 Units by mouth daily.     clopidogrel (PLAVIX) 75 MG tablet Take 1 tablet (75 mg total) by mouth daily. 30 tablet 0   Coenzyme Q10 100 MG TABS Take 100 mg by mouth daily.     docusate sodium (COLACE) 100 MG capsule  Take 100 mg by mouth daily.     Evolocumab (REPATHA SURECLICK) 140 MG/ML SOAJ Inject 140 mg into the skin every 14 (fourteen) days. 6 mL 1   ezetimibe (ZETIA) 10 MG tablet Take 1 tablet (10 mg total) by mouth daily. 90 tablet 3   fluticasone (FLONASE) 50 MCG/ACT nasal spray Place 2 sprays into both nostrils daily as needed for rhinitis.     hydrocortisone valerate cream (WESTCORT) 0.2 % Apply 1 application  topically 3 (three) times a week. Right ear  for eczema     isosorbide mononitrate (IMDUR) 30 MG 24 hr tablet Take 1 tablet (30 mg total) by mouth daily. 90 tablet 3   lisinopril (ZESTRIL) 20 MG tablet Take 1 tablet (20 mg total) by mouth in the morning and at bedtime. 180 tablet 3   metoprolol succinate (TOPROL-XL) 25 MG 24 hr tablet Take 1 tablet  (25 mg total) by mouth daily. 90 tablet 3   metoprolol tartrate (LOPRESSOR) 25 MG tablet Take 25 mg by mouth daily as needed (Afib/ a flutter).     nitroGLYCERIN (NITROSTAT) 0.4 MG SL tablet Place 1 tablet (0.4 mg total) under the tongue every 5 (five) minutes x 3 doses as needed for chest pain. 25 tablet 2   pyridOXINE (VITAMIN B-6) 100 MG tablet Take 100 mg by mouth daily.     rosuvastatin (CRESTOR) 20 MG tablet Take 1 tablet by mouth once daily 90 tablet 3   umeclidinium-vilanterol (ANORO ELLIPTA) 62.5-25 MCG/ACT AEPB Inhale 1 puff by mouth once daily 60 each 11   valACYclovir (VALTREX) 500 MG tablet Take 500 mg by mouth every evening.     vitamin B-12 (CYANOCOBALAMIN) 1000 MCG tablet Take 1,000 mcg by mouth daily.     No current facility-administered medications for this visit.     ROS:  See HPI  Physical Exam:  Vitals:   01/10/24 0857  BP: (!) 173/75  Pulse: (!) 55  Temp: (!) 97.2 F (36.2 C)  SpO2: 97%    General: very pleasant, in no acute distress Cardiac: regular Lungs: non labored Incision:  left groin incision well healed, there is firm ridge along incision, I suspect this is scar tissue/ healing ridge, non tender, no swelling, no hematoma, no erythema Extremities:  BLE well perfused and warm 2+ femoral pulses, Doppler DP/PT/ pero signals bilaterally Neuro: alert and oriented Abdomen:  soft  Assessment/Plan:  This is a 78 y.o. female who is s/p: left Iliofemoral endarterectomy with bovine pericardial patch angioplasty, left SFA intra-arterial lithotripsy, left SFA DCB angioplasty and LLE Angiogram on 11/28/23 by Dr. Myra Gianotti.   This was performed for LLE disabling claudication.  - Provided reassurance that her groin incision is healing very well. No concerning clinical exam findings - encourage walking regimen - Continue Aspirin,Eliquis, Statin, Repatha - She has appointment arrange don 4/21 with non invasive studies. She will keep this appointment. She knows to call  however for earlier follow up if any concerns   Nathanial Rancher, [facility] [phone]   Clinic MD:  Karin Lieu

## 2024-01-11 ENCOUNTER — Other Ambulatory Visit: Payer: Self-pay

## 2024-01-11 DIAGNOSIS — R319 Hematuria, unspecified: Secondary | ICD-10-CM

## 2024-01-16 ENCOUNTER — Encounter: Payer: Self-pay | Admitting: Urology

## 2024-01-16 ENCOUNTER — Ambulatory Visit (INDEPENDENT_AMBULATORY_CARE_PROVIDER_SITE_OTHER): Payer: Medicare Other | Admitting: Urology

## 2024-01-16 VITALS — BP 150/72 | Ht 63.0 in | Wt 155.0 lb

## 2024-01-16 DIAGNOSIS — R399 Unspecified symptoms and signs involving the genitourinary system: Secondary | ICD-10-CM

## 2024-01-16 DIAGNOSIS — R319 Hematuria, unspecified: Secondary | ICD-10-CM
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
## 2024-01-23 DIAGNOSIS — Z0184 Encounter for antibody response examination: Secondary | ICD-10-CM | POA: Diagnosis not present

## 2024-01-24 ENCOUNTER — Other Ambulatory Visit: Payer: Self-pay

## 2024-01-24 ENCOUNTER — Telehealth: Payer: Self-pay | Admitting: Cardiovascular Disease

## 2024-01-24 MED ORDER — METOPROLOL SUCCINATE ER 25 MG PO TB24: 25.0000 mg | ORAL_TABLET | Freq: Every day | ORAL | 3 refills | Status: DC

## 2024-01-24 MED ORDER — NITROGLYCERIN 0.4 MG SL SUBL: 0.4000 mg | SUBLINGUAL_TABLET | SUBLINGUAL | 2 refills | Status: AC | PRN

## 2024-01-24 NOTE — Telephone Encounter (Signed)
*  STAT* If patient is at the pharmacy, call can be transferred to refill team.   1. Which medications need to be refilled? (please list name of each medication and dose if known) metoprolol tartrate (LOPRESSOR) 25 MG tablet   nitroGLYCERIN (NITROSTAT) 0.4 MG SL tablet   2. Which pharmacy/location (including street and city if local pharmacy) is medication to be sent to? [id number] - [place] [address] [place] - [address]   3. Do they need a 30 day or 90 day supply? 90

## 2024-01-29 ENCOUNTER — Ambulatory Visit: Payer: Medicare Other | Attending: Cardiovascular Disease | Admitting: Cardiovascular Disease

## 2024-01-29 ENCOUNTER — Encounter: Payer: Self-pay | Admitting: Cardiovascular Disease

## 2024-01-29 VITALS — BP 144/76 | HR 69 | Wt 157.0 lb

## 2024-01-29 DIAGNOSIS — I480 Paroxysmal atrial fibrillation: Secondary | ICD-10-CM | POA: Diagnosis not present

## 2024-01-29 DIAGNOSIS — E785 Hyperlipidemia, unspecified: Secondary | ICD-10-CM | POA: Diagnosis not present

## 2024-01-29 DIAGNOSIS — I701 Atherosclerosis of renal artery: Secondary | ICD-10-CM

## 2024-01-29 DIAGNOSIS — K551 Chronic vascular disorders of intestine: Secondary | ICD-10-CM

## 2024-01-29 DIAGNOSIS — I10 Essential (primary) hypertension: Secondary | ICD-10-CM | POA: Diagnosis not present

## 2024-01-29 DIAGNOSIS — I739 Peripheral vascular disease, unspecified: Secondary | ICD-10-CM

## 2024-01-29 DIAGNOSIS — I25810 Atherosclerosis of coronary artery bypass graft(s) without angina pectoris: Secondary | ICD-10-CM

## 2024-01-29 NOTE — Patient Instructions (Signed)
 Medication Instructions:  No changes *If you need a refill on your cardiac medications before your next appointment, please call your pharmacy*   Lab Work: None ordered If you have labs (blood work) drawn today and your tests are completely normal, you will receive your results only by: MyChart Message (if you have MyChart) OR A paper copy in the mail If you have any lab test that is abnormal or we need to change your treatment, we will call you to review the results.   Testing/Procedures: None ordered   Follow-Up: At [facility], you and your health needs are our priority.  As part of our continuing mission to provide you with exceptional heart care, we have created designated Provider Care Teams.  These Care Teams include your primary Cardiologist (physician) and Advanced Practice Providers (APPs -  Physician Assistants and Nurse Practitioners) who all work together to provide you with the care you need, when you need it.  We recommend signing up for the patient portal called "MyChart".  Sign up information is provided on this After Visit Summary.  MyChart is used to connect with patients for Virtual Visits (Telemedicine).  Patients are able to view lab/test results, encounter notes, upcoming appointments, etc.  Non-urgent messages can be sent to your provider as well.   To learn more about what you can do with MyChart, go to [email or web address].    Your next appointment:   6 month(s)  Provider:   Dr. Kirke Corin
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
## 2024-01-30 ENCOUNTER — Encounter: Payer: Self-pay | Admitting: Pulmonary Disease

## 2024-01-30 ENCOUNTER — Telehealth: Payer: Self-pay

## 2024-01-30 ENCOUNTER — Encounter: Payer: Self-pay | Admitting: Cardiovascular Disease

## 2024-01-30 DIAGNOSIS — E785 Hyperlipidemia, unspecified: Secondary | ICD-10-CM

## 2024-01-30 DIAGNOSIS — U071 COVID-19: Secondary | ICD-10-CM

## 2024-01-30 DIAGNOSIS — I25810 Atherosclerosis of coronary artery bypass graft(s) without angina pectoris: Secondary | ICD-10-CM

## 2024-01-30 NOTE — Telephone Encounter (Signed)
 Yes - referral to pulm rehab at [facility], diagnosis is covid 24 - thanks!

## 2024-01-30 NOTE — Telephone Encounter (Signed)
 Patient called stating she reviewed her office notes from office visit on 01/16/2024 and would like to get the following corrected please-May have undergone a urethral dilation more than 10 to 15 years ago-This needs to be changed to 30+ years instead.The 10 to 15 years time frame was when she had colon surgery and other procedures that were related to that.

## 2024-01-31 NOTE — Telephone Encounter (Addendum)
 Called spoke to patient for clarification of what is needed.  1) patient states Dr Shari Prows had advised her to stay with Dr Kirke Corin as Primary cardiologist and PV cardiologist. The information corrected on her _ Patient care team  2)  The pharmacy team handles cholesterol medication Repatha.  This question will be defer to the team to see when you need to recheck your cholesterol level. She only took on e dose of Leviqo in Dec . She receive assistance for Repatha ,so she started Repatha    Patient voiced understanding

## 2024-02-01 NOTE — Telephone Encounter (Signed)
 Familiar with patient. Recommend updating fasting lipid panel in 3 months. Can be drawn at [facility], [facility]     01/31/24  2:20 PM

## 2024-02-01 NOTE — Telephone Encounter (Signed)
 Called spoke to patient . She is aware  that she can recheck  Lipid panel in 3 months (June 2025). She is aware that lipid panel has entered into the system   She verbalized understanding.

## 2024-02-04 ENCOUNTER — Other Ambulatory Visit: Payer: Self-pay | Admitting: *Deleted

## 2024-02-04 DIAGNOSIS — I272 Pulmonary hypertension, unspecified: Secondary | ICD-10-CM

## 2024-02-06 ENCOUNTER — Ambulatory Visit: Payer: Medicare Other | Admitting: Surgery

## 2024-02-06 ENCOUNTER — Ambulatory Visit
Admission: RE | Admit: 2024-02-06 | Discharge: 2024-02-06 | Disposition: A | Discharge status: Home / Self Care | Payer: Medicare Other | Source: Ambulatory Visit | Attending: Surgery | Admitting: Surgery

## 2024-02-06 DIAGNOSIS — J439 Emphysema, unspecified: Secondary | ICD-10-CM | POA: Diagnosis not present

## 2024-02-06 DIAGNOSIS — I700 Atherosclerosis of aorta: Secondary | ICD-10-CM | POA: Diagnosis not present

## 2024-02-06 DIAGNOSIS — Z85118 Personal history of other malignant neoplasm of bronchus and lung: Secondary | ICD-10-CM | POA: Diagnosis not present

## 2024-02-06 DIAGNOSIS — J941 Fibrothorax: Secondary | ICD-10-CM | POA: Diagnosis not present

## 2024-02-06 DIAGNOSIS — C3432 Malignant neoplasm of lower lobe, left bronchus or lung: Secondary | ICD-10-CM

## 2024-02-08 NOTE — Telephone Encounter (Signed)
 Patient called again today wanting to know an update on getting the notes corrected from her visit on 3/19.

## 2024-02-13 ENCOUNTER — Encounter: Payer: Self-pay | Admitting: Surgery

## 2024-02-13 ENCOUNTER — Ambulatory Visit: Payer: Medicare Other | Admitting: Surgery

## 2024-02-13 ENCOUNTER — Ambulatory Visit (INDEPENDENT_AMBULATORY_CARE_PROVIDER_SITE_OTHER): Payer: Medicare Other | Admitting: Surgery

## 2024-02-13 VITALS — BP 140/77 | HR 74 | Resp 18 | Ht 63.0 in | Wt 155.0 lb

## 2024-02-13 DIAGNOSIS — Z85118 Personal history of other malignant neoplasm of bronchus and lung: Secondary | ICD-10-CM | POA: Diagnosis not present

## 2024-02-13 NOTE — Progress Notes (Signed)
 HPI:  The patient is a 78 year old woman who underwent left muscle-sparing thoracotomy and left lower lobectomy on 07/02/2017 for a T1, N0, N0 squamous cell carcinoma of the lung.  She had lymphovascular invasion but negative resection margins. She has CLL but it is early stage and does not require any treatment. When I saw her in February 2021 a CT of the chest showed some enlargement of the left axillary lymph nodes with the most prominent one measuring 1.3 cm.  We decided to do a PET scan to evaluate that further and it showed mild metabolic activity associated with the left axillary lymphadenopathy but less than the background blood pool activity.  The previously seen 12 mm lymph node had decreased in size to 10 mm.  It was felt that most likely this lymphadenopathy was related to her recent Moderna COVID-19 vaccine given in the left arm.  She underwent an ablation for paroxysmal atrial fibrillation by Dr. Elberta Fortis on 02/09/2022.  She recently underwent left iliofemoral endarterectomy with pericardial patch angioplasty and left SFA lithotripsy and drug-coated balloon angioplasty by Dr. Myra Gianotti on 11/28/2023 for disabling claudication.  She continues to fairly feel well overall.  She does not feel like her breathing is as good as it was prior to her vascular surgery.  She has some chronic shortness of breath and is followed by Dr. Judeth Horn from pulmonary medicine for COPD and pulmonary hypertension.   Current Outpatient Medications  Medication Sig Dispense Refill   Acetaminophen Extra Strength 500 MG CAPS Take 500 mg by mouth as needed.     albuterol (VENTOLIN HFA) 108 (90 Base) MCG/ACT inhaler Inhale 2 puffs into the lungs every 6 (six) hours as needed for wheezing or shortness of breath. 8 g 5   apixaban (ELIQUIS) 5 MG TABS tablet Take 1 tablet (5 mg total) by mouth 2 (two) times daily. 60 tablet 11   aspirin EC 81 MG tablet Take 81 mg by mouth daily. Swallow whole.  Pt started taking when  stopped COUMADIN on 11/21/22     Calcium Carbonate (CALTRATE 600 PO) Take 600 mg by mouth daily.     Carboxymethylcellulose Sodium (THERATEARS) 0.25 % SOLN Place 1-2 drops into both eyes 3 (three) times daily.     Cholecalciferol (VITAMIN D) 50 MCG (2000 UT) CAPS Take 2,000 Units by mouth daily.     Coenzyme Q10 100 MG TABS Take 100 mg by mouth daily.     docusate sodium (COLACE) 100 MG capsule Take 100 mg by mouth daily.     Evolocumab (REPATHA SURECLICK) 140 MG/ML SOAJ 140 mg.     ezetimibe (ZETIA) 10 MG tablet Take 1 tablet (10 mg total) by mouth daily. 90 tablet 3   hydrocortisone valerate cream (WESTCORT) 0.2 % Apply 1 application  topically 3 (three) times a week. Right ear  for eczema     isosorbide mononitrate (IMDUR) 30 MG 24 hr tablet Take 1 tablet (30 mg total) by mouth daily. 90 tablet 3   lisinopril (ZESTRIL) 20 MG tablet Take 1 tablet (20 mg total) by mouth in the morning and at bedtime. 180 tablet 3   metoprolol succinate (TOPROL-XL) 25 MG 24 hr tablet Take 1 tablet (25 mg total) by mouth daily. 90 tablet 3   metoprolol tartrate (LOPRESSOR) 25 MG tablet Take 25 mg by mouth daily as needed (Afib/ a flutter).     nitroGLYCERIN (NITROSTAT) 0.4 MG SL tablet Place 1 tablet (0.4 mg total) under the tongue every 5 (  five) minutes x 3 doses as needed for chest pain. 25 tablet 2   pyridOXINE (VITAMIN B-6) 100 MG tablet Take 100 mg by mouth daily.     rosuvastatin (CRESTOR) 20 MG tablet Take 1 tablet by mouth once daily (Patient taking differently: Take 20 mg by mouth at bedtime.) 90 tablet 3   umeclidinium-vilanterol (ANORO ELLIPTA) 62.5-25 MCG/ACT AEPB Inhale 1 puff by mouth once daily (Patient taking differently: Inhale 1 puff into the lungs at bedtime.) 60 each 11   valACYclovir (VALTREX) 500 MG tablet Take 500 mg by mouth every evening.     vitamin B-12 (CYANOCOBALAMIN) 1000 MCG tablet Take 1,000 mcg by mouth daily.     No current facility-administered medications for this visit.      Physical Exam: BP (!) 140/77   Pulse 74   Resp 18   Ht 5\' 3"  (1.6 m)   Wt 155 lb (70.3 kg)   SpO2 95% Comment: RA  BMI 27.46 kg/m  She looks well. There is no cervical or supraclavicular adenopathy. Cardiac exam shows a regular rate and rhythm with normal heart sounds. Lungs are clear.  Diagnostic Tests:  Narrative & Impression  CLINICAL DATA:  Non-small cell lung cancer.  * Tracking Code: BO *   EXAM: CT CHEST WITHOUT CONTRAST   TECHNIQUE: Multidetector CT imaging of the chest was performed following the standard protocol without IV contrast.   RADIATION DOSE REDUCTION: This exam was performed according to the departmental dose-optimization program which includes automated exposure control, adjustment of the mA and/or kV according to patient size and/or use of iterative reconstruction technique.   COMPARISON:  02/07/2023.   FINDINGS: Cardiovascular: Atherosclerotic calcification of the aorta and aortic valve. Heart is enlarged. No pericardial effusion.   Mediastinum/Nodes: No pathologically enlarged mediastinal or axillary lymph nodes. Hilar regions are difficult to definitively evaluate without IV contrast. Esophagus is grossly unremarkable.   Lungs/Pleura: Centrilobular and paraseptal emphysema. Calcified granulomas. Left lower lobectomy. Small left fibrothorax with adjacent scarring in the left upper lobe. Lungs are otherwise clear. Airway is unremarkable.   Upper Abdomen: Small left hepatic lobe cyst. No specific follow-up necessary. Small hiatal hernia. Visualized portions of the liver, gallbladder, adrenal glands, kidneys, spleen, pancreas, stomach and bowel are otherwise grossly unremarkable. No upper abdominal adenopathy.   Musculoskeletal: Degenerative changes in the spine. T3, T4, T10 and T12 compression fractures, old. Old left thoracotomies. No worrisome lytic or sclerotic lesions.   IMPRESSION: 1. No evidence of recurrent or metastatic  disease. 2. Small left fibrothorax. 3.  Aortic atherosclerosis (ICD10-I70.0). 4.  Emphysema (ICD10-J43.9).     Electronically Signed   By: Leanna Battles M.D.   On: 02/06/2024 15:42      Impression:  She is now 6 and half years following her lobectomy for lung cancer without evidence of recurrent or metastatic disease on CT.  The small left fibrothorax is unchanged.  There is an unchanged 3 mm nodule in the periphery of the right upper lobe.  I reviewed the CT images with her and answered all of her questions.  Plan:  I will see her back in 1 year with a CT scan of the chest without contrast for aortic surveillance.  I spent 10 minutes performing this established patient evaluation and > 50% of this time was spent face to face counseling and coordinating the surveillance of this patient's previously resected lung cancer.   Alleen Borne, MD [facility] [phone]

## 2024-02-15 ENCOUNTER — Encounter: Payer: Self-pay | Admitting: Surgery

## 2024-02-15 ENCOUNTER — Telehealth: Payer: Self-pay

## 2024-02-15 NOTE — Telephone Encounter (Signed)
 Please have pt scheduled for Dr Judeth Horn next week if able or place on cancellation list for  " after April 7 for an assessment since having covid twice in the Fall 2024 and the femoral endarterectomy on 11/28/2023 "

## 2024-02-18 ENCOUNTER — Encounter: Payer: Self-pay | Admitting: Pulmonary Disease

## 2024-02-18 ENCOUNTER — Telehealth: Payer: Self-pay

## 2024-02-18 DIAGNOSIS — C9110 Chronic lymphocytic leukemia of B-cell type not having achieved remission: Secondary | ICD-10-CM | POA: Diagnosis not present

## 2024-02-18 NOTE — Telephone Encounter (Signed)
 Copied from CRM [id number]. Topic: Referral - Question >> Feb 15, 2024  2:46 PM [name] wrote: Reason for CRM: Patient called and wanted to inquire about her pulmonary rehab ar [facility], patient stated she has checked her MyChart.   Please advise with the status of pulm rehab referral. Routing to [facility]

## 2024-02-19 ENCOUNTER — Encounter: Payer: Self-pay | Admitting: Cardiovascular Disease

## 2024-02-20 ENCOUNTER — Ambulatory Visit: Admitting: Pulmonary Disease

## 2024-02-20 ENCOUNTER — Encounter: Payer: Self-pay | Admitting: Pulmonary Disease

## 2024-02-20 VITALS — BP 134/68 | HR 62 | Ht 63.0 in | Wt 157.0 lb

## 2024-02-20 DIAGNOSIS — J432 Centrilobular emphysema: Secondary | ICD-10-CM
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
## 2024-02-20 NOTE — Patient Instructions (Signed)
 Nice to see you again  No change in the medication  I really think pulmonary rehab will help, if there is any issue with doing this in person let me know there is a virtual option  Return to clinic in 6 months or sooner as needed with Dr. Judeth Horn

## 2024-02-21 ENCOUNTER — Encounter: Payer: Self-pay | Admitting: Pulmonary Disease

## 2024-02-21 ENCOUNTER — Encounter: Payer: Self-pay | Admitting: *Deleted

## 2024-02-21 ENCOUNTER — Encounter: Attending: Pulmonary Disease | Admitting: *Deleted

## 2024-02-21 DIAGNOSIS — I272 Pulmonary hypertension, unspecified: Secondary | ICD-10-CM | POA: Insufficient documentation

## 2024-02-21 NOTE — Addendum Note (Signed)
 Addended byVilma Meckel on: 02/21/2024 05:34 PM   Modules accepted: Orders

## 2024-02-21 NOTE — Progress Notes (Signed)
.  Virtual orientation call completed today. shehas an appointment on Date: 02/27/2024  for EP eval and gym Orientation.  Documentation of diagnosis can be found in [facility] 10/09/2023 .

## 2024-02-27 ENCOUNTER — Encounter

## 2024-02-27 VITALS — Ht 63.58 in | Wt 155.5 lb

## 2024-02-27 DIAGNOSIS — I272 Pulmonary hypertension, unspecified: Secondary | ICD-10-CM

## 2024-02-27 NOTE — Progress Notes (Signed)
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 Discharge (Final Psychosocial Re-Evaluation):   Education: Education Goals: Education classes will be provided on a weekly basis, covering required topics. Participant will state understanding/return demonstration of topics presented.  Learning Barriers/Preferences:  Learning Barriers/Preferences - 02/21/24 1612       Learning Barriers/Preferences   Learning Barriers Hearing   has hearing aids   Learning Preferences None             General Pulmonary Education Topics:  Infection Prevention: - Provides verbal and written material  to individual with discussion of infection control including proper hand washing and proper equipment cleaning during exercise session. Flowsheet Row Pulmonary Rehab from 02/27/2024 in [facility] Cardiac and Pulmonary Rehab  Date 02/27/24  Educator MB  Instruction Review  Code 1- Verbalizes Understanding       Falls Prevention: - Provides verbal and written material to individual with discussion of falls prevention and safety. Flowsheet Row Pulmonary Rehab from 02/27/2024 in [facility] Cardiac and Pulmonary Rehab  Date 02/27/24  Educator MB  Instruction Review Code 1- Verbalizes Understanding       Chronic Lung Disease Review: - Group verbal instruction with posters, models, PowerPoint presentations and videos,  to review new updates, new respiratory medications, new advancements in procedures and treatments. Providing information on websites and "800" numbers for continued self-education. Includes information about supplement oxygen, available portable oxygen systems, continuous and intermittent flow rates, oxygen safety, concentrators, and Medicare reimbursement for oxygen. Explanation of Pulmonary Drugs, including class, frequency, complications, importance of spacers, rinsing mouth after steroid MDI's, and proper cleaning methods for nebulizers. Review of basic lung anatomy and physiology related to function, structure, and complications of lung disease. Review of risk factors. Discussion about methods for diagnosing sleep apnea and types of masks and machines for OSA. Includes a review of the use of types of environmental controls: home humidity, furnaces, filters, dust mite/pet prevention, HEPA vacuums. Discussion about weather changes, air quality and the benefits of nasal washing. Instruction on Warning signs, infection symptoms, calling MD promptly, preventive modes, and value of vaccinations. Review of effective airway clearance, coughing and/or vibration techniques. Emphasizing that all should Create an Action Plan. Written material given at graduation. Flowsheet Row Pulmonary Rehab from 01/02/2018 in [facility] Cardiac and Pulmonary Rehab  Date 11/14/17  Educator [facility]  Instruction Review Code 1- Verbalizes Understanding       AED/CPR: - Group verbal and written  instruction with the use of models to demonstrate the basic use of the AED with the basic ABC's of resuscitation. Flowsheet Row Pulmonary Rehab from 01/02/2018 in [facility] Cardiac and Pulmonary Rehab  Date 11/02/17  Educator [facility]  Instruction Review Code 1- Verbalizes Understanding        Anatomy and Cardiac Procedures: - Group verbal and visual presentation and models provide information about basic cardiac anatomy and function. Reviews the testing methods done to diagnose heart disease and the outcomes of the test results. Describes the treatment choices: Medical Management, Angioplasty, or Coronary Bypass Surgery for treating various heart conditions including Myocardial Infarction, Angina, Valve Disease, and Cardiac Arrhythmias.  Written material given at graduation. Flowsheet Row Pulmonary Rehab from 01/02/2018 in [facility] Cardiac and Pulmonary Rehab  Date 12/12/17  Educator [facility]  Instruction Review Code 1- Verbalizes Understanding       Medication Safety: - Group verbal and visual instruction to review commonly prescribed medications for heart and lung disease. Reviews the medication, class of the drug, and side effects. Includes the steps to properly store meds and maintain the prescription regimen.  Written material given at graduation. Flowsheet Row Pulmonary Rehab from 01/02/2018 in [facility] Cardiac and Pulmonary Rehab  Date 12/28/17  Educator [facility]  Instruction Review Code 1- Verbalizes Understanding       Other: -Provides group and verbal instruction on various topics (see comments) Flowsheet Row Pulmonary Rehab from 01/02/2018 in [facility] Cardiac and Pulmonary Rehab  Date 12/05/17  Educator [facility]  Instruction Review Code 1- Verbalizes Understanding  [SLEEP]       Knowledge Questionnaire Score:  Core Components/Risk Factors/Patient Goals at Admission:  Personal Goals and Risk Factors at Admission - 02/27/24 1559       Core Components/Risk Factors/Patient Goals on Admission    Weight  Management Yes;Weight Loss;Weight Maintenance    Intervention Weight Management: Develop a combined nutrition and exercise program designed to reach desired caloric intake, while maintaining appropriate intake of nutrient and fiber, sodium and fats, and appropriate energy expenditure required for the weight goal.;Weight Management: Provide education and appropriate resources to help participant work on and attain dietary goals.    Admit Weight 155 lb 8 oz (70.5 kg)    Goal Weight: Short Term 154 lb (69.9 kg)    Goal Weight: Long Term 150 lb (68 kg)    Expected Outcomes Short Term: Continue to assess and modify interventions until short term weight is achieved;Long Term: Adherence to nutrition and physical activity/exercise program aimed toward attainment of established weight goal;Weight Maintenance: Understanding of the daily nutrition guidelines, which includes 25-35% calories from fat, 7% or less cal from saturated fats, less than 200mg  cholesterol, less than 1.5gm of sodium, & 5 or more servings of fruits and vegetables daily;Weight Loss: Understanding of general recommendations for a balanced deficit meal plan, which promotes 1-2 lb weight loss per week and includes a negative energy balance of [phone] kcal/d;Understanding recommendations for meals to include 15-35% energy as protein, 25-35% energy from fat, 35-60% energy from carbohydrates, less than 200mg  of dietary cholesterol, 20-35 gm of total fiber daily;Understanding of distribution of calorie intake throughout the day with the consumption of 4-5 meals/snacks    Improve shortness of breath with ADL's Yes    Intervention Provide education, individualized exercise plan and daily activity instruction to help decrease symptoms of SOB with activities of daily living.    Expected Outcomes Short Term: Improve cardiorespiratory fitness to achieve a reduction of symptoms when performing ADLs;Long Term: Be able to perform more ADLs without symptoms or  delay the onset of symptoms    Increase knowledge of respiratory medications and ability to use respiratory devices properly  Yes    Intervention Provide education and demonstration as needed of appropriate use of medications, inhalers, and oxygen therapy.    Expected Outcomes Short Term: Achieves understanding of medications use. Understands that oxygen is a medication prescribed by physician. Demonstrates appropriate use of inhaler and oxygen therapy.;Long Term: Maintain appropriate use of medications, inhalers, and oxygen therapy.    Hypertension Yes    Intervention Provide education on lifestyle modifcations including regular physical activity/exercise, weight management, moderate sodium restriction and increased consumption of fresh fruit, vegetables, and low fat dairy, alcohol moderation, and smoking cessation.;Monitor prescription use compliance.    Expected Outcomes Short Term: Continued assessment and intervention until BP is < 140/48mm HG in hypertensive participants. < 130/2mm HG in hypertensive participants with diabetes, heart failure or chronic kidney disease.;Long Term: Maintenance of blood pressure at goal levels.    Lipids Yes    Intervention Provide education and support for participant on nutrition & aerobic/resistive exercise along with prescribed medications to achieve LDL 70mg , HDL >40mg .    Expected Outcomes Short Term: Participant states understanding of desired cholesterol values and is compliant with medications prescribed. Participant is following exercise prescription and nutrition guidelines.;Long Term: Cholesterol controlled with medications as prescribed, with individualized exercise RX and with personalized nutrition plan. Value goals: LDL < 70mg , HDL > 40 mg.             Education:Diabetes - Individual verbal and written instruction to  review signs/symptoms of diabetes, desired ranges of glucose level fasting, after meals and with exercise. Acknowledge that pre  and post exercise glucose checks will be done for 3 sessions at entry of program.   Know Your Numbers and Heart Failure: - Group verbal and visual instruction to discuss disease risk factors for cardiac and pulmonary disease and treatment options.  Reviews associated critical values for Overweight/Obesity, Hypertension, Cholesterol, and Diabetes.  Discusses basics of heart failure: signs/symptoms and treatments.  Introduces Heart Failure Zone chart for action plan for heart failure.  Written material given at graduation.   Core Components/Risk Factors/Patient Goals Review:    Core Components/Risk Factors/Patient Goals at Discharge (Final Review):    ITP Comments:  ITP Comments     Row Name 02/21/24 1631 02/27/24 1539         ITP Comments Virtual orientation call completed today. shehas an appointment on Date: 02/27/2024  for EP eval and gym Orientation.  Documentation of diagnosis can be found in [facility] 10/09/2023 . Completed and gym orientation for respiratory care services. Initial ITP created and sent for review to Dr. Faud Aleskerov, Medical Director.               Comments: Initial ITP

## 2024-02-27 NOTE — Progress Notes (Signed)
 Assessment start time: 2:45 PM  Digestive issues/concerns: Diverticulitis   24-hours Recall: B: egg with yolk, 1 egg without yolk, 1 slice sourdough bread with avocado, almond milk L: sometimes skipped D: fish, veggies, fruit salad  Beverages water (48oz), almond milk Alcohol glass of wine with dinner  Education r/t nutrition plan Patient drinking mostly water, sometimes will have unsweetened tea. Has a glass or wine with dinner sometimes. She reports she has been following a mediterranean diet for years now. She tries to limit or avoid processed foods when possible. She says sometimes she skips lunch but then is very hungry come dinner time. Spoke with her about not skipping meal and instead making small meals or snacks that have high nutritional quality. Brainstormed several ideas together focusing on pairing complex carbs with protein or healthy fats. She likes fruits and veggies eating them often.    Goal 1: Eat 15-30gProtein and 30-60gCarbs at each meal. Goal 2: Read labels and reduce sodium intake to below 2300mg . Ideally 1500mg  per day.  End time 3:29 PM

## 2024-02-27 NOTE — Patient Instructions (Signed)
 Patient Instructions  Patient Details  Name: [name] MRN: [id number] Date of Birth: [birth year] Referring Provider:  Karren Burly, MD  Below are your personal goals for exercise, nutrition, and risk factors. Our goal is to help you stay on track towards obtaining and maintaining these goals. We will be discussing your progress on these goals with you throughout the program.  Initial Exercise Prescription:  Initial Exercise Prescription - 02/27/24 1500       Date of Initial Exercise RX and Referring Provider   Date 02/27/24    Referring Provider Vilma Meckel, MD      Oxygen   Maintain Oxygen Saturation 88% or higher      Treadmill   MPH 2.3    Grade 0    Minutes 15    METs 2.76      NuStep   Level 2    SPM 80    Minutes 15    METs 2.8      Arm Ergometer   Level 1    RPM 30    Minutes 15    METs 2.8      T5 Nustep   Level 2    SPM 80    Minutes 15    METs 2.8      Prescription Details   Frequency (times per week) 2    Duration Progress to 30 minutes of continuous aerobic without signs/symptoms of physical distress      Intensity   THRR 40-80% of Max Heartrate 94-125    Ratings of Perceived Exertion 11-13    Perceived Dyspnea 0-4      Progression   Progression Continue to progress workloads to maintain intensity without signs/symptoms of physical distress.      Resistance Training   Training Prescription Yes    Weight 3 lb    Reps 10-15             Exercise Goals: Frequency: Be able to perform aerobic exercise two to three times per week in program working toward 2-5 days per week of home exercise.  Intensity: Work with a perceived exertion of 11 (fairly light) - 15 (hard) while following your exercise prescription.  We will make changes to your prescription with you as you progress through the program.   Duration: Be able to do 30 to 45 minutes of continuous aerobic exercise in addition to a 5 minute warm-up and a 5 minute  cool-down routine.   Nutrition Goals: Your personal nutrition goals will be established when you do your nutrition analysis with the dietician.  The following are general nutrition guidelines to follow: Cholesterol < 200mg /day Sodium < 1500mg /day Fiber: Women over 50 yrs - 21 grams per day  Personal Goals:  Personal Goals and Risk Factors at Admission - 02/27/24 1559       Core Components/Risk Factors/Patient Goals on Admission    Weight Management Yes;Weight Loss;Weight Maintenance    Intervention Weight Management: Develop a combined nutrition and exercise program designed to reach desired caloric intake, while maintaining appropriate intake of nutrient and fiber, sodium and fats, and appropriate energy expenditure required for the weight goal.;Weight Management: Provide education and appropriate resources to help participant work on and attain dietary goals.    Admit Weight 155 lb 8 oz (70.5 kg)    Goal Weight: Short Term 154 lb (69.9 kg)    Goal Weight: Long Term 150 lb (68 kg)    Expected Outcomes Short Term: Continue to assess and modify interventions  until short term weight is achieved;Long Term: Adherence to nutrition and physical activity/exercise program aimed toward attainment of established weight goal;Weight Maintenance: Understanding of the daily nutrition guidelines, which includes 25-35% calories from fat, 7% or less cal from saturated fats, less than 200mg  cholesterol, less than 1.5gm of sodium, & 5 or more servings of fruits and vegetables daily;Weight Loss: Understanding of general recommendations for a balanced deficit meal plan, which promotes 1-2 lb weight loss per week and includes a negative energy balance of [phone] kcal/d;Understanding recommendations for meals to include 15-35% energy as protein, 25-35% energy from fat, 35-60% energy from carbohydrates, less than 200mg  of dietary cholesterol, 20-35 gm of total fiber daily;Understanding of distribution of calorie intake  throughout the day with the consumption of 4-5 meals/snacks    Improve shortness of breath with ADL's Yes    Intervention Provide education, individualized exercise plan and daily activity instruction to help decrease symptoms of SOB with activities of daily living.    Expected Outcomes Short Term: Improve cardiorespiratory fitness to achieve a reduction of symptoms when performing ADLs;Long Term: Be able to perform more ADLs without symptoms or delay the onset of symptoms    Increase knowledge of respiratory medications and ability to use respiratory devices properly  Yes    Intervention Provide education and demonstration as needed of appropriate use of medications, inhalers, and oxygen therapy.    Expected Outcomes Short Term: Achieves understanding of medications use. Understands that oxygen is a medication prescribed by physician. Demonstrates appropriate use of inhaler and oxygen therapy.;Long Term: Maintain appropriate use of medications, inhalers, and oxygen therapy.    Hypertension Yes    Intervention Provide education on lifestyle modifcations including regular physical activity/exercise, weight management, moderate sodium restriction and increased consumption of fresh fruit, vegetables, and low fat dairy, alcohol moderation, and smoking cessation.;Monitor prescription use compliance.    Expected Outcomes Short Term: Continued assessment and intervention until BP is < 140/67mm HG in hypertensive participants. < 130/57mm HG in hypertensive participants with diabetes, heart failure or chronic kidney disease.;Long Term: Maintenance of blood pressure at goal levels.    Lipids Yes    Intervention Provide education and support for participant on nutrition & aerobic/resistive exercise along with prescribed medications to achieve LDL 70mg , HDL >40mg .    Expected Outcomes Short Term: Participant states understanding of desired cholesterol values and is compliant with medications prescribed. Participant  is following exercise prescription and nutrition guidelines.;Long Term: Cholesterol controlled with medications as prescribed, with individualized exercise RX and with personalized nutrition plan. Value goals: LDL < 70mg , HDL > 40 mg.             Tobacco Use Initial Evaluation: Social History   Tobacco Use  Smoking Status Former   Current packs/day: 0.00   Average packs/day: 1 pack/day for 40.0 years (40.0 ttl pk-yrs)   Types: Cigarettes   Start date: 09/15/1967   Quit date: 09/15/2007   Years since quitting: 16.4  Smokeless Tobacco Never  Tobacco Comments   Former smoker Quit 2008    Exercise Goals and Review:  Exercise Goals     Row Name 02/27/24 1546             Exercise Goals   Increase Physical Activity Yes       Intervention Provide advice, education, support and counseling about physical activity/exercise needs.;Develop an individualized exercise prescription for aerobic and resistive training based on initial evaluation findings, risk stratification, comorbidities and participant's personal goals.  Expected Outcomes Short Term: Attend rehab on a regular basis to increase amount of physical activity.;Long Term: Add in home exercise to make exercise part of routine and to increase amount of physical activity.;Long Term: Exercising regularly at least 3-5 days a week.       Increase Strength and Stamina Yes       Intervention Provide advice, education, support and counseling about physical activity/exercise needs.;Develop an individualized exercise prescription for aerobic and resistive training based on initial evaluation findings, risk stratification, comorbidities and participant's personal goals.       Expected Outcomes Short Term: Increase workloads from initial exercise prescription for resistance, speed, and METs.;Short Term: Perform resistance training exercises routinely during rehab and add in resistance training at home;Long Term: Improve cardiorespiratory  fitness, muscular endurance and strength as measured by increased METs and functional capacity ( )       Able to understand and use rate of perceived exertion (RPE) scale Yes       Intervention Provide education and explanation on how to use RPE scale       Expected Outcomes Short Term: Able to use RPE daily in rehab to express subjective intensity level;Long Term:  Able to use RPE to guide intensity level when exercising independently       Able to understand and use Dyspnea scale Yes       Intervention Provide education and explanation on how to use Dyspnea scale       Expected Outcomes Short Term: Able to use Dyspnea scale daily in rehab to express subjective sense of shortness of breath during exertion;Long Term: Able to use Dyspnea scale to guide intensity level when exercising independently       Knowledge and understanding of Target Heart Rate Range (THRR) Yes       Intervention Provide education and explanation of THRR including how the numbers were predicted and where they are located for reference       Expected Outcomes Short Term: Able to state/look up THRR;Long Term: Able to use THRR to govern intensity when exercising independently;Short Term: Able to use daily as guideline for intensity in rehab       Able to check pulse independently Yes       Intervention Provide education and demonstration on how to check pulse in carotid and radial arteries.;Review the importance of being able to check your own pulse for safety during independent exercise       Expected Outcomes Short Term: Able to explain why pulse checking is important during independent exercise;Long Term: Able to check pulse independently and accurately       Understanding of Exercise Prescription Yes       Intervention Provide education, explanation, and written materials on patient's individual exercise prescription       Expected Outcomes Long Term: Able to explain home exercise prescription to exercise independently;Short  Term: Able to explain program exercise prescription

## 2024-02-28 ENCOUNTER — Telehealth: Payer: Self-pay

## 2024-02-28 NOTE — Telephone Encounter (Signed)
 Pharmacy please advise on holding Eliquis prior to full dental extraction and osteoplasty scheduled for TBD. Thank you.

## 2024-02-28 NOTE — Telephone Encounter (Signed)
   Pre-operative Risk Assessment    Patient Name: [name]  DOB: 1946-02-24 MRN: [id number]   Date of last office visit: 01/29/24 Date of next office visit: Not scheduled  Request for Surgical Clearance    Procedure:  Dental Extraction - Amount of Teeth to be Pulled:  Extraction of all maxillary teeth and osteoplasty  Date of Surgery:  Clearance TBD                                Surgeon:  Johnney Nam, M.D. Surgeon's Group or Practice Name:  [facility] Phone number:  [phone] Fax number:  [phone]   Type of Clearance Requested:   - Medical  - Pharmacy:  Hold Apixaban (Eliquis)     Type of Anesthesia:   Local & IV sedation   Additional requests/questions:    [name]   02/28/2024, 3:24 PM

## 2024-02-29 ENCOUNTER — Encounter: Payer: Self-pay | Admitting: Cardiovascular Disease

## 2024-03-03 ENCOUNTER — Ambulatory Visit (INDEPENDENT_AMBULATORY_CARE_PROVIDER_SITE_OTHER): Payer: Medicare Other | Admitting: Surgery

## 2024-03-03 ENCOUNTER — Encounter: Admitting: *Deleted

## 2024-03-03 ENCOUNTER — Ambulatory Visit (HOSPITAL_COMMUNITY)
Admission: RE | Admit: 2024-03-03 | Discharge: 2024-03-03 | Disposition: A | Discharge status: Home / Self Care | Payer: Medicare Other | Source: Ambulatory Visit | Attending: Surgery | Admitting: Surgery

## 2024-03-03 ENCOUNTER — Encounter: Payer: Self-pay | Admitting: Surgery

## 2024-03-03 ENCOUNTER — Ambulatory Visit (HOSPITAL_BASED_OUTPATIENT_CLINIC_OR_DEPARTMENT_OTHER)
Admission: RE | Admit: 2024-03-03 | Discharge: 2024-03-03 | Disposition: A | Discharge status: Home / Self Care | Payer: Medicare Other | Source: Ambulatory Visit | Attending: Surgery | Admitting: Surgery

## 2024-03-03 VITALS — BP 158/75 | HR 61 | Temp 97.2°F | Resp 16 | Ht 63.0 in | Wt 155.0 lb

## 2024-03-03 DIAGNOSIS — I70213 Atherosclerosis of native arteries of extremities with intermittent claudication, bilateral legs: Secondary | ICD-10-CM

## 2024-03-03 DIAGNOSIS — I272 Pulmonary hypertension, unspecified: Secondary | ICD-10-CM

## 2024-03-03 DIAGNOSIS — I70212 Atherosclerosis of native arteries of extremities with intermittent claudication, left leg: Secondary | ICD-10-CM | POA: Diagnosis not present

## 2024-03-03 LAB — VAS US ABI WITH/WO TBI
Left ABI: 0.96
Right ABI: 0.86

## 2024-03-03 NOTE — Telephone Encounter (Signed)
 Patient with diagnosis of atrial fibrillation on Eliquis  for anticoagulation.    Procedure:  Dental Extraction - Amount of Teeth to be Pulled:  Extraction of all maxillary teeth and osteoplasty   Date of Surgery:  Clearance TBD   CHA2DS2-VASc Score = 5   This indicates a 7.2% annual risk of stroke. The patient's score is based upon: CHF History: 0 HTN History: 1 Diabetes History: 0 Stroke History: 0 Vascular Disease History: 1 Age Score: 2 Gender Score: 1   CrCl 84 Platelet count 201  Patient does not require pre-op antibiotics for dental procedure.  Per office protocol, patient can hold Eliquis  for 2 days prior to procedure.   Patient will not need bridging with Lovenox  (enoxaparin ) around procedure.  **This guidance is not considered finalized until pre-operative APP has relayed final recommendations.**

## 2024-03-03 NOTE — Telephone Encounter (Signed)
She is at low risk from a cardiac standpoint.  
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
## 2024-03-03 NOTE — Progress Notes (Signed)
 Daily Session Note  Patient Details  Name: [name] MRN: [id number] Date of Birth: December 03, 1945 Referring Provider:   [facility] Pulmonary Rehab from 02/27/2024 in [facility] Cardiac and Pulmonary Rehab  Referring Provider Orbie Binder, MD       Encounter Date: 03/03/2024  Check In:  Session Check In - 03/03/24 1559       Check-In   Supervising physician immediately available to respond to emergencies See telemetry face sheet for immediately available ER MD    Location [facility]-Cardiac & Pulmonary Rehab    Staff Present Sue Em RN,BSN;Maxon Conetta BS, Exercise Physiologist;Susanne Bice, RN, BSN, CCRP;Joseph Hood RCP,RRT,BSRT    Virtual Visit No    Medication changes reported     No    Fall or balance concerns reported    No    Warm-up and Cool-down Performed on first and last piece of equipment    Resistance Training Performed Yes    VAD Patient? No    PAD/SET Patient? No      Pain Assessment   Currently in Pain? No/denies                Social History   Tobacco Use  Smoking Status Former   Current packs/day: 0.00   Average packs/day: 1 pack/day for 40.0 years (40.0 ttl pk-yrs)   Types: Cigarettes   Start date: 09/15/1967   Quit date: 09/15/2007   Years since quitting: 16.4  Smokeless Tobacco Never  Tobacco Comments   Former smoker Quit 2008    Goals Met:  Independence with exercise equipment Exercise tolerated well No report of concerns or symptoms today Strength training completed today  Goals Unmet:  Not Applicable  Comments: First full day of exercise!  Patient was oriented to gym and equipment including functions, settings, policies, and procedures.  Patient's individual exercise prescription and treatment plan were reviewed.  All starting workloads were established based on the results of the 6 minute walk test done at initial orientation visit.  The plan for exercise progression was also introduced and progression will be customized  based on patient's performance and goals.    Dr. Firman Hughes is Medical Director for [facility] Cardiac Rehabilitation.  Dr. Fuad Aleskerov is Medical Director for [facility] Pulmonary Rehabilitation.

## 2024-03-04 NOTE — Telephone Encounter (Signed)
   Patient Name: [name]  DOB: 09-11-1946 MRN: [id number]  Primary Cardiologist: Antionette Kirks, MD  Chart reviewed as part of pre-operative protocol coverage. Given past medical history and time since last visit, based on ACC/AHA guidelines, KRISTL MORIOKA is at acceptable risk for the planned procedure without further cardiovascular testing.   The patient was last seen in the office on 01/29/2024 and was doing well.  Per Dr. Alvenia Aus, "She is at low risk from a cardiac standpoint."  Patient does not require pre-op antibiotics for dental procedure.   Per office protocol, patient can hold Eliquis  for 2 days prior to procedure.   Patient will not need bridging with Lovenox  (enoxaparin ) around procedure. Please resume Eliquis  as soon as possible postprocedure, at the discretion of the surgeon.    I will route this recommendation to the requesting party via Epic fax function and remove from pre-op pool.  Please call with questions.  Jude Norton, NP 03/04/2024, 8:25 AM

## 2024-03-05 ENCOUNTER — Encounter: Admitting: *Deleted

## 2024-03-05 DIAGNOSIS — I272 Pulmonary hypertension, unspecified: Secondary | ICD-10-CM

## 2024-03-05 NOTE — Progress Notes (Signed)
 Daily Session Note  Patient Details  Name: [name] MRN: [id number] Date of Birth: [birth year] Referring Provider:   [facility] Kass Pulmonary Rehab from 02/27/2024 in [facility] Cardiac and Pulmonary Rehab  Referring Provider Orbie Binder, MD       Encounter Date: 03/05/2024  Check In:  Session Check In - 03/05/24 1533       Check-In   Supervising physician immediately available to respond to emergencies See telemetry face sheet for immediately available ER MD    Location [facility]-Cardiac & Pulmonary Rehab    Staff Present Sue Em RN,BSN;Joseph Nancey Awkward;Maud Sorenson, RN, BSN, [employer], ACSM CEP, Exercise Physiologist    Virtual Visit No    Medication changes reported     No    Fall or balance concerns reported    No    Warm-up and Cool-down Performed on first and last piece of equipment    Resistance Training Performed Yes    VAD Patient? No    PAD/SET Patient? No      Pain Assessment   Currently in Pain? No/denies                Social History   Tobacco Use  Smoking Status Former   Current packs/day: 0.00   Average packs/day: 1 pack/day for 40.0 years (40.0 ttl pk-yrs)   Types: Cigarettes   Start date: 09/15/1967   Quit date: 09/15/2007   Years since quitting: 16.4  Smokeless Tobacco Never  Tobacco Comments   Former smoker Quit 2008    Goals Met:  Independence with exercise equipment Exercise tolerated well No report of concerns or symptoms today Strength training completed today  Goals Unmet:  Not Applicable  Comments: Pt able to follow exercise prescription today without complaint.  Will continue to monitor for progression.    Dr. Firman Hughes is Medical Director for [facility] Cardiac Rehabilitation.  Dr. Fuad Aleskerov is Medical Director for [facility] Pulmonary Rehabilitation.

## 2024-03-06 ENCOUNTER — Encounter: Admitting: *Deleted

## 2024-03-06 DIAGNOSIS — I272 Pulmonary hypertension, unspecified: Secondary | ICD-10-CM | POA: Diagnosis not present

## 2024-03-06 NOTE — Progress Notes (Signed)
 Daily Session Note  Patient Details  Name: FEIGE LOWDERMILK MRN: [id number] Date of Birth: 1946-08-07 Referring Provider:   Gatt[facility] Kass Pulmonary Rehab from 02/27/2024 in [facility] Cardiac and Pulmonary Rehab  Referring Provider Orbie Binder, MD       Encounter Date: 03/06/2024  Check In:  Session Check In - 03/06/24 1351       Check-In   Supervising physician immediately available to respond to emergencies See telemetry face sheet for immediately available ER MD    Location [facility]-Cardiac & Pulmonary Rehab    Staff Present Sue Em RN,BSN;Joseph [facility] BS, Exercise Physiologist;Noah Tickle, BS, Exercise Physiologist    Virtual Visit No    Medication changes reported     No    Fall or balance concerns reported    No    Warm-up and Cool-down Performed on first and last piece of equipment    Resistance Training Performed Yes    VAD Patient? No    PAD/SET Patient? No      Pain Assessment   Currently in Pain? No/denies                Social History   Tobacco Use  Smoking Status Former   Current packs/day: 0.00   Average packs/day: 1 pack/day for 40.0 years (40.0 ttl pk-yrs)   Types: Cigarettes   Start date: 09/15/1967   Quit date: 09/15/2007   Years since quitting: 16.4  Smokeless Tobacco Never  Tobacco Comments   Former smoker Quit 2008    Goals Met:  Independence with exercise equipment Exercise tolerated well No report of concerns or symptoms today Strength training completed today  Goals Unmet:  Not Applicable  Comments: Pt able to follow exercise prescription today without complaint.  Will continue to monitor for progression.    Dr. Firman Hughes is Medical Director for [facility] Cardiac Rehabilitation.  Dr. Fuad Aleskerov is Medical Director for [facility] Pulmonary Rehabilitation.

## 2024-03-10 ENCOUNTER — Encounter: Admitting: *Deleted

## 2024-03-10 DIAGNOSIS — I272 Pulmonary hypertension, unspecified: Secondary | ICD-10-CM | POA: Diagnosis not present

## 2024-03-10 NOTE — Progress Notes (Signed)
 Daily Session Note  Patient Details  Name: [name] MRN: [id number] Date of Birth: June 21, 1946 Referring Provider:   Gattis Kass Pulmonary Rehab from 02/27/2024 in [facility] Cardiac and Pulmonary Rehab  Referring Provider Orbie Binder, MD       Encounter Date: 03/10/2024  Check In:  Session Check In - 03/10/24 1541       Check-In   Supervising physician immediately available to respond to emergencies See telemetry face sheet for immediately available ER MD    Location [facility]-Cardiac & Pulmonary Rehab    Staff Present Sue Em RN,BSN;Susanne Bice, RN, BSN, CCRP;Maxon Conetta BS, Exercise Physiologist;Noah Tickle, BS, Exercise Physiologist    Virtual Visit No    Medication changes reported     No    Fall or balance concerns reported    No    Warm-up and Cool-down Performed on first and last piece of equipment    Resistance Training Performed Yes    VAD Patient? No    PAD/SET Patient? No      Pain Assessment   Currently in Pain? No/denies                Social History   Tobacco Use  Smoking Status Former   Current packs/day: 0.00   Average packs/day: 1 pack/day for 40.0 years (40.0 ttl pk-yrs)   Types: Cigarettes   Start date: 09/15/1967   Quit date: 09/15/2007   Years since quitting: 16.4  Smokeless Tobacco Never  Tobacco Comments   Former smoker Quit 2008    Goals Met:  Independence with exercise equipment Exercise tolerated well No report of concerns or symptoms today Strength training completed today  Goals Unmet:  Not Applicable  Comments: Pt able to follow exercise prescription today without complaint.  Will continue to monitor for progression.    Dr. Firman Hughes is Medical Director for [facility] Cardiac Rehabilitation.  Dr. Fuad Aleskerov is Medical Director for [facility] Pulmonary Rehabilitation.

## 2024-03-12 ENCOUNTER — Encounter: Admitting: *Deleted

## 2024-03-12 DIAGNOSIS — I272 Pulmonary hypertension, unspecified: Secondary | ICD-10-CM | POA: Diagnosis not present

## 2024-03-12 NOTE — Progress Notes (Signed)
 Daily Session Note  Patient Details  Name: [name] MRN: [id number] Date of Birth: Jun 08, 1946 Referring Provider:   Gattis Kass Pulmonary Rehab from 02/27/2024 in [facility] Cardiac and Pulmonary Rehab  Referring Provider Orbie Binder, MD       Encounter Date: 03/12/2024  Check In:  Session Check In - 03/12/24 1545       Check-In   Supervising physician immediately available to respond to emergencies See telemetry face sheet for immediately available ER MD    Location [facility]-Cardiac & Pulmonary Rehab    Staff Present Sue Em RN,BSN;Joseph Nancey Awkward;Maud Sorenson, RN, BSN, [employer], ACSM CEP, Exercise Physiologist    Virtual Visit No    Medication changes reported     No    Fall or balance concerns reported    No    Warm-up and Cool-down Performed on first and last piece of equipment    Resistance Training Performed Yes    VAD Patient? No    PAD/SET Patient? No      Pain Assessment   Currently in Pain? No/denies                Social History   Tobacco Use  Smoking Status Former   Current packs/day: 0.00   Average packs/day: 1 pack/day for 40.0 years (40.0 ttl pk-yrs)   Types: Cigarettes   Start date: 09/15/1967   Quit date: 09/15/2007   Years since quitting: 16.5  Smokeless Tobacco Never  Tobacco Comments   Former smoker Quit 2008    Goals Met:  Independence with exercise equipment Exercise tolerated well No report of concerns or symptoms today Strength training completed today  Goals Unmet:  Not Applicable  Comments: Pt able to follow exercise prescription today without complaint.  Will continue to monitor for progression.    Dr. Firman Hughes is Medical Director for [facility] Cardiac Rehabilitation.  Dr. Fuad Aleskerov is Medical Director for [facility] Pulmonary Rehabilitation.

## 2024-03-17 ENCOUNTER — Other Ambulatory Visit: Payer: Self-pay | Admitting: *Deleted

## 2024-03-17 ENCOUNTER — Encounter

## 2024-03-17 DIAGNOSIS — I739 Peripheral vascular disease, unspecified: Secondary | ICD-10-CM

## 2024-03-18 ENCOUNTER — Encounter: Payer: Self-pay | Admitting: Surgery

## 2024-03-18 ENCOUNTER — Encounter: Payer: Self-pay | Admitting: Pharmacist Clinician (PhC)/ Clinical Pharmacy Specialist

## 2024-03-18 ENCOUNTER — Encounter: Payer: Self-pay | Admitting: Cardiology

## 2024-03-19 ENCOUNTER — Encounter

## 2024-03-19 ENCOUNTER — Encounter: Payer: Self-pay | Admitting: Cardiovascular Disease

## 2024-03-19 ENCOUNTER — Encounter: Payer: Self-pay | Admitting: *Deleted

## 2024-03-19 DIAGNOSIS — I272 Pulmonary hypertension, unspecified: Secondary | ICD-10-CM

## 2024-03-19 NOTE — Progress Notes (Signed)
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 comorbidities.;Nutrition handout(s) given to patient.    Expected Outcomes Short Term Goal: Understand basic principles of dietary content, such as calories, fat, sodium, cholesterol and nutrients.;Short Term Goal: A plan has been developed with personal nutrition goals set during dietitian appointment.;Long Term Goal: Adherence to prescribed nutrition plan.             Nutrition Assessments:  MEDIFICTS Score Key: >=70 Need to make dietary changes  40-70 Heart Healthy Diet <= 40 Therapeutic Level Cholesterol Diet   Picture Your Plate Scores: <60 Unhealthy dietary pattern with much room for improvement. 41-50 Dietary pattern unlikely to meet recommendations for good health and room for improvement. 51-60 More healthful dietary pattern, with some room for improvement.  >60 Healthy dietary pattern, although there may be  some specific behaviors that could be improved.   Nutrition Goals Re-Evaluation:   Nutrition Goals Discharge (Final Nutrition Goals Re-Evaluation):   Psychosocial: Target Goals: Acknowledge presence or absence of significant depression and/or stress, maximize coping skills, provide positive support system. Participant is able to verbalize types and ability to use techniques and skills needed for reducing stress and depression.   Education: Stress, Anxiety, and Depression - Group verbal and visual presentation to define topics covered.  Reviews how body is impacted by stress, anxiety, and depression.  Also discusses healthy ways to reduce stress and to treat/manage anxiety and depression.  Written material given at graduation. Flowsheet Row Pulmonary Rehab from 01/02/2018 in [facility] Cardiac and Pulmonary Rehab  Date 01/02/18  Educator [facility]  Instruction Review Code 1- [employer] Understanding       Education: Sleep Hygiene -Provides group verbal and written instruction about how sleep can affect your health.  Define sleep hygiene, discuss sleep cycles and impact of sleep habits. Review good sleep hygiene tips.    Initial Review & Psychosocial Screening:  Initial Psych Review & Screening - 02/21/24 1606       Initial Review   Current issues with None Identified      Family Dynamics   Good Support System? Yes   daughter , son in law.     Barriers   Psychosocial barriers to participate in program There are no identifiable barriers or psychosocial needs.      Screening Interventions   Interventions To provide support and resources with identified psychosocial needs;Provide feedback about the scores to participant;Encouraged to exercise    Expected Outcomes Short Term goal: Utilizing psychosocial counselor, staff and physician to assist with identification of specific Stressors or current issues interfering with healing process. Setting desired goal for each stressor or current issue  identified.;Long Term Goal: Stressors or current issues are controlled or eliminated.;Short Term goal: Identification and review with participant of any Quality of Life or Depression concerns found by scoring the questionnaire.;Long Term goal: The participant improves quality of Life and PHQ9 Scores as seen by post scores and/or verbalization of changes  Quality of Life Scores:  Scores of 19 and below usually indicate a poorer quality of life in these areas.  A difference of  2-3 points is a clinically meaningful difference.  A difference of 2-3 points in the total score of the Quality of Life Index has been associated with significant improvement in overall quality of life, self-image, physical symptoms, and general health in studies assessing change in quality of life.  PHQ-9: Review Flowsheet       02/27/2024 12/25/2018 12/26/2017 09/17/2017  Depression screen PHQ 2/9  Decreased Interest 2 0 0 0  Down, Depressed, Hopeless 0 0 0 0  PHQ - 2 Score 2 0 0 0  Altered sleeping 1 - 0 0  Tired, decreased energy 2 - 1 1  Change in appetite 0 - 0 0  Feeling bad or failure about yourself  0 - 0 0  Trouble concentrating 0 - 0 1  Moving slowly or fidgety/restless 0 - 0 0  Suicidal thoughts 0 - 0 0  PHQ-9 Score 5 - 1 2  Difficult doing work/chores Somewhat difficult - Not difficult at all Not difficult at all   Interpretation of Total Score  Total Score Depression Severity:  1-4 = Minimal depression, 5-9 = Mild depression, 10-14 = Moderate depression, 15-19 = Moderately severe depression, 20-27 = Severe depression   Psychosocial Evaluation and Intervention:  Psychosocial Evaluation - 02/21/24 1632       Psychosocial Evaluation & Interventions   Interventions Encouraged to exercise with the program and follow exercise prescription    Comments THere are no barriers to attending the program.   [facility] lives alone with a son that travels [place] there on the weekends. He and her  daughterand son in law are her support.  She has completed Cardiac and Pulmnary rehab programs in the past and is looking forward to starting and building back her strength and stamina.  Sh ehas had several prcedures/surgeries since her last admission in the program and has not returned to her ususal exercise regimen.  No depression, stress or other concerns are noted.  She is ready to start.    Expected Outcomes STG  attend all scheduled sessions, gradual progression with exercie, attend education sessions.  LTG conrinue her exwercise progression after discharge.    Continue Psychosocial Services  Follow up required by staff             Psychosocial Re-Evaluation:   Psychosocial Discharge (Final Psychosocial Re-Evaluation):   Education: Education Goals: Education classes will be provided on a weekly basis, covering required topics. Participant will state understanding/return demonstration of topics presented.  Learning Barriers/Preferences:  Learning Barriers/Preferences - 02/21/24 1612       Learning Barriers/Preferences   Learning Barriers Hearing   has hearing aids   Learning Preferences None             General Pulmonary Education Topics:  Infection Prevention: - Provides verbal and written material to individual with discussion of infection control including proper hand washing and proper equipment cleaning during exercise session. Flowsheet Row Pulmonary Rehab from 03/05/2024 in [facility] Cardiac and Pulmonary Rehab  Date 02/27/24  Educator MB  Instruction Review Code 1- Verbalizes Understanding       Falls Prevention: - Provides verbal and written material to individual with discussion of falls prevention and safety. Flowsheet Row Pulmonary Rehab from 03/05/2024 in [facility] Cardiac and Pulmonary Rehab  Date 02/27/24  Educator MB  Instruction Review Code 1- Verbalizes Understanding  Chronic Lung Disease Review: - Group verbal instruction with posters, models,  PowerPoint presentations and videos,  to review new updates, new respiratory medications, new advancements in procedures and treatments. Providing information on websites and "800" numbers for continued self-education. Includes information about supplement oxygen , available portable oxygen  systems, continuous and intermittent flow rates, oxygen  safety, concentrators, and Medicare reimbursement for oxygen . Explanation of Pulmonary Drugs, including class, frequency, complications, importance of spacers, rinsing mouth after steroid MDI's, and proper cleaning methods for nebulizers. Review of basic lung anatomy and physiology related to function, structure, and complications of lung disease. Review of risk factors. Discussion about methods for diagnosing sleep apnea and types of masks and machines for OSA. Includes a review of the use of types of environmental controls: home humidity, furnaces, filters, dust mite/pet prevention, HEPA vacuums. Discussion about weather changes, air quality and the benefits of nasal washing. Instruction on Warning signs, infection symptoms, calling MD promptly, preventive modes, and value of vaccinations. Review of effective airway clearance, coughing and/or vibration techniques. Emphasizing that all should Create an Action Plan. Written material given at graduation. Flowsheet Row Pulmonary Rehab from 01/02/2018 in [facility] Cardiac and Pulmonary Rehab  Date 11/14/17  Educator [facility]  Instruction Review Code 1- Verbalizes Understanding       AED/CPR: - Group verbal and written instruction with the use of models to demonstrate the basic use of the AED with the basic ABC's of resuscitation. Flowsheet Row Pulmonary Rehab from 01/02/2018 in [facility] Cardiac and Pulmonary Rehab  Date 11/02/17  Educator [facility]  Instruction Review Code 1- Verbalizes Understanding        Anatomy and Cardiac Procedures: - Group verbal and visual presentation and models provide information about basic cardiac  anatomy and function. Reviews the testing methods done to diagnose heart disease and the outcomes of the test results. Describes the treatment choices: Medical Management, Angioplasty, or Coronary Bypass Surgery for treating various heart conditions including Myocardial Infarction, Angina, Valve Disease, and Cardiac Arrhythmias.  Written material given at graduation. Flowsheet Row Pulmonary Rehab from 01/02/2018 in [facility] Cardiac and Pulmonary Rehab  Date 12/12/17  Educator [facility]  Instruction Review Code 1- Verbalizes Understanding       Medication Safety: - Group verbal and visual instruction to review commonly prescribed medications for heart and lung disease. Reviews the medication, class of the drug, and side effects. Includes the steps to properly store meds and maintain the prescription regimen.  Written material given at graduation. Flowsheet Row Pulmonary Rehab from 01/02/2018 in [facility] Cardiac and Pulmonary Rehab  Date 12/28/17  Educator [facility]  Instruction Review Code 1- Verbalizes Understanding       Other: -Provides group and verbal instruction on various topics (see comments) Flowsheet Row Pulmonary Rehab from 01/02/2018 in [facility] Cardiac and Pulmonary Rehab  Date 12/05/17  Educator [facility]  Instruction Review Code 1- Verbalizes Understanding  [SLEEP]       Knowledge Questionnaire Score:    Core Components/Risk Factors/Patient Goals at Admission:  Personal Goals and Risk Factors at Admission - 02/27/24 1559       Core Components/Risk Factors/Patient Goals on Admission    Weight Management Yes;Weight Loss;Weight Maintenance    Intervention Weight Management: Develop a combined nutrition and exercise program designed to reach desired caloric intake, while maintaining appropriate intake of nutrient and fiber, sodium and fats, and appropriate energy expenditure required for the weight goal.;Weight Management: Provide education and appropriate resources to help participant work on and attain  dietary goals.    Admit Weight  155 lb 8 oz (70.5 kg)    Goal Weight: Short Term 154 lb (69.9 kg)    Goal Weight: Long Term 150 lb (68 kg)    Expected Outcomes Short Term: Continue to assess and modify interventions until short term weight is achieved;Long Term: Adherence to nutrition and physical activity/exercise program aimed toward attainment of established weight goal;Weight Maintenance: Understanding of the daily nutrition guidelines, which includes 25-35% calories from fat, 7% or less cal from saturated fats, less than 200mg  cholesterol, less than 1.5gm of sodium, & 5 or more servings of fruits and vegetables daily;Weight Loss: Understanding of general recommendations for a balanced deficit meal plan, which promotes 1-2 lb weight loss per week and includes a negative energy balance of [phone] kcal/d;Understanding recommendations for meals to include 15-35% energy as protein, 25-35% energy from fat, 35-60% energy from carbohydrates, less than 200mg  of dietary cholesterol, 20-35 gm of total fiber daily;Understanding of distribution of calorie intake throughout the day with the consumption of 4-5 meals/snacks    Improve shortness of breath with ADL's Yes    Intervention Provide education, individualized exercise plan and daily activity instruction to help decrease symptoms of SOB with activities of daily living.    Expected Outcomes Short Term: Improve cardiorespiratory fitness to achieve a reduction of symptoms when performing ADLs;Long Term: Be able to perform more ADLs without symptoms or delay the onset of symptoms    Increase knowledge of respiratory medications and ability to use respiratory devices properly  Yes    Intervention Provide education and demonstration as needed of appropriate use of medications, inhalers, and oxygen  therapy.    Expected Outcomes Short Term: Achieves understanding of medications use. Understands that oxygen  is a medication prescribed by physician. Demonstrates  appropriate use of inhaler and oxygen  therapy.;Long Term: Maintain appropriate use of medications, inhalers, and oxygen  therapy.    Hypertension Yes    Intervention Provide education on lifestyle modifcations including regular physical activity/exercise, weight management, moderate sodium restriction and increased consumption of fresh fruit, vegetables, and low fat dairy, alcohol  moderation, and smoking cessation.;Monitor prescription use compliance.    Expected Outcomes Short Term: Continued assessment and intervention until BP is < 140/81mm HG in hypertensive participants. < 130/69mm HG in hypertensive participants with diabetes, heart failure or chronic kidney disease.;Long Term: Maintenance of blood pressure at goal levels.    Lipids Yes    Intervention Provide education and support for participant on nutrition & aerobic/resistive exercise along with prescribed medications to achieve LDL 70mg , HDL >40mg .    Expected Outcomes Short Term: Participant states understanding of desired cholesterol values and is compliant with medications prescribed. Participant is following exercise prescription and nutrition guidelines.;Long Term: Cholesterol controlled with medications as prescribed, with individualized exercise RX and with personalized nutrition plan. Value goals: LDL < 70mg , HDL > 40 mg.             Education:Diabetes - Individual verbal and written instruction to review signs/symptoms of diabetes, desired ranges of glucose level fasting, after meals and with exercise. Acknowledge that pre and post exercise glucose checks will be done for 3 sessions at entry of program.   Know Your Numbers and Heart Failure: - Group verbal and visual instruction to discuss disease risk factors for cardiac and pulmonary disease and treatment options.  Reviews associated critical values for Overweight/Obesity, Hypertension, Cholesterol, and Diabetes.  Discusses basics of heart failure: signs/symptoms and  treatments.  Introduces Heart Failure Zone chart for action plan for heart failure.  Written material given  at graduation.   Core Components/Risk Factors/Patient Goals Review:    Core Components/Risk Factors/Patient Goals at Discharge (Final Review):    ITP Comments:  ITP Comments     Row Name 02/21/24 1631 02/27/24 1539 03/03/24 1600 03/19/24 1305     ITP Comments Virtual orientation call completed today. shehas an appointment on Date: 02/27/2024  for EP eval and gym Orientation.  Documentation of diagnosis can be found in [facility] 10/09/2023 . Completed and gym orientation for respiratory care services. Initial ITP created and sent for review to Dr. Faud Aleskerov, Medical Director. First full day of exercise!  Patient was oriented to gym and equipment including functions, settings, policies, and procedures.  Patient's individual exercise prescription and treatment plan were reviewed.  All starting workloads were established based on the results of the 6 minute walk test done at initial orientation visit.  The plan for exercise progression was also introduced and progression will be customized based on patient's performance and goals. 30 Day review completed. Medical Director ITP review done, changes made as directed, and signed approval by Medical Director.    new to program             Comments:

## 2024-03-24 ENCOUNTER — Encounter: Attending: Pulmonary Disease

## 2024-03-24 DIAGNOSIS — I272 Pulmonary hypertension, unspecified: Secondary | ICD-10-CM | POA: Insufficient documentation

## 2024-03-26 ENCOUNTER — Encounter

## 2024-03-27 ENCOUNTER — Ambulatory Visit: Admitting: Pulmonary Disease

## 2024-03-27 NOTE — Telephone Encounter (Signed)
 Spoke to pt, apologized for the delay.  Advised her to discuss w/ rehab and have them fax over request that we will be glad to fill out if needed.  Pt verbalized understanding and will speak with them.

## 2024-03-31 ENCOUNTER — Encounter: Payer: Self-pay | Admitting: Pharmacist Clinician (PhC)/ Clinical Pharmacy Specialist

## 2024-03-31 ENCOUNTER — Encounter

## 2024-03-31 ENCOUNTER — Ambulatory Visit: Attending: Cardiology | Admitting: Pharmacist Clinician (PhC)/ Clinical Pharmacy Specialist

## 2024-03-31 DIAGNOSIS — E785 Hyperlipidemia, unspecified: Secondary | ICD-10-CM | POA: Insufficient documentation

## 2024-03-31 DIAGNOSIS — I25810 Atherosclerosis of coronary artery bypass graft(s) without angina pectoris: Secondary | ICD-10-CM | POA: Diagnosis not present

## 2024-03-31 NOTE — Progress Notes (Signed)
 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   Clindamycin /Lincomycin Swelling    TROUBLE SWALLOWING......SEVERE CHEST PAIN   Doxycycline  Other (See Comments)    blistering   Sulfa Antibiotics Rash and Other (See Comments)    Red, burning rash & paralysis Burning Rash   Lipitor [Atorvastatin] Other (See Comments)    MYALGIAS LEG PAIN   Phenergan [Promethazine Hcl] Other (See Comments)    Nervous Leg / Restless Leg Syndrome   Reclast  [Zoledronic  Acid] Other (See Comments)    Flu symptoms - made pt very sick, and had inflammation in her eye   Carvedilol  Other (See Comments)    Per patient was "wiped out"  for 2 days after taking     Cephalexin     Unknown reaction    Dofetilide      Muscle Pain Lumbar pain and effects walking   Ketorolac  Nausea Only    headache   Diltiazem  Other (See Comments)    Weakness on oral Dilt, tolerating 120 mg currently   Latex Rash    History of Present Illness    [name] is a 78 y.o. female patient of Dr Alvenia Aus, in the office today to discuss options for cholesterol management.  We have had multiple discussions with [name] over the years about different options for cholesterol management.  She is currently tolerating  rosuvastatin  20 mg and ezetimibe  10, however LDL is still not at goal of < 55.   Today she notes she is interested in lowering the Lp(a).  Explained that while PCSK9 inhibitors can lower Lp(a) by as much as 20-25%, inclisiran doesn't have the same data.  When she saw Chris Pavero back in June she was hoping to start a new injection, but did not know the name.  Dr. Ardell Beauvais was consulted and stated hoped patient was amenable to PCSK9 or inclisiran.    Today she is in the office because of concerns for self injection.  Reports pain with most doses and believes she may be doing something wrong.    Insurance Carrier:  [employer] - medicare supplement  LDL Cholesterol goal:  LDL < 55  Current Medications:   rosuvastatin  20 mg, ezetimibe  10 mg   Previously tried:  atorvastatin, simvastatin ,  alirocumab   Family Hx: mother had htn, stents, dad had first MI at 82; 5 brothers - 4 have various ASCVD    Accessory Clinical Findings   Lab Results  Component Value Date   CHOL 79 11/29/2023   HDL 43 11/29/2023   LDLCALC 22 11/29/2023   TRIG 70 11/29/2023   CHOLHDL 1.8 11/29/2023    Lipoprotein (a)  Date/Time Value Ref Range Status  01/02/2023 11:52 AM 234.6 (H) <75.0 nmol/L  Final    Comment:    **Results verified by repeat testing** Note:  Values greater than or equal to 75.0 nmol/L may        indicate an independent risk factor for CHD,        but must be evaluated with caution when applied        to non-Caucasian populations due to the        influence of genetic factors on Lp(a) across        ethnicities.     Lab Results  Component Value Date   ALT 28 11/22/2023   AST 31 11/22/2023   ALKPHOS 45 11/22/2023   BILITOT 0.6 11/22/2023   Lab Results  Component Value Date   CREATININE 0.63 11/29/2023   BUN 12 11/29/2023   NA 134 (L) 11/29/2023   K 3.8 11/29/2023   CL 104 11/29/2023   CO2 22 11/29/2023   Lab Results  Component Value Date   HGBA1C 5.7 (H) 02/21/2013    Home  Medications    Current Outpatient Medications  Medication Sig Dispense Refill   Acetaminophen  Extra Strength 500 MG CAPS Take 500 mg by mouth as needed.     albuterol  (VENTOLIN  HFA) 108 (90 Base) MCG/ACT inhaler Inhale 2 puffs into the lungs every 6 (six) hours as needed for wheezing or shortness of breath. 8 g 5   apixaban  (ELIQUIS ) 5 MG TABS tablet Take 1 tablet (5 mg total) by mouth 2 (two) times daily. 60 tablet 11   aspirin  EC 81 MG tablet Take 81 mg by mouth daily. Swallow whole.     Calcium  Carbonate (CALTRATE 600 PO) Take 600 mg by mouth daily.     Carboxymethylcellulose Sodium (THERATEARS) 0.25 % SOLN Place 1-2 drops into both eyes 3 (three) times daily.     Cholecalciferol  (VITAMIN D ) 50 MCG (2000 UT) CAPS Take 2,000 Units by mouth daily.     Coenzyme Q10 100 MG TABS Take 100 mg by mouth daily.     docusate sodium  (COLACE) 100 MG capsule Take 100 mg by mouth daily.     Evolocumab  (REPATHA  SURECLICK) 140 MG/ML SOAJ 140 mg.     ezetimibe  (ZETIA ) 10 MG tablet Take 1 tablet (10 mg total) by mouth daily. 90 tablet 3   hydrocortisone  valerate cream (WESTCORT ) 0.2 % Apply 1 application  topically 3 (three) times a week. Right ear  for eczema     isosorbide  mononitrate (IMDUR ) 30 MG 24 hr tablet Take 1 tablet (30 mg total) by mouth daily. 90 tablet 3   lisinopril  (ZESTRIL ) 20 MG tablet Take 1 tablet (20 mg total) by mouth in the morning and at bedtime. 180 tablet 3   metoprolol  succinate (TOPROL -XL) 25 MG 24 hr tablet Take 1 tablet (25 mg total) by mouth daily. 90 tablet 3   metoprolol  tartrate (LOPRESSOR ) 25 MG tablet Take 25 mg by mouth daily as needed (Afib/ a flutter).     nitroGLYCERIN  (NITROSTAT ) 0.4 MG SL tablet Place 1 tablet (0.4 mg total) under the tongue every 5 (five) minutes x 3 doses as needed for chest pain. 25 tablet 2   pyridOXINE  (VITAMIN B-6) 100 MG tablet Take 100 mg by mouth daily.     rosuvastatin  (CRESTOR ) 20 MG tablet Take 1 tablet by mouth once daily (Patient taking  differently: Take 20 mg by mouth at bedtime.) 90 tablet 3   triamcinolone  (NASACORT ) 55 MCG/ACT AERO nasal inhaler Place 1 spray into the nose 2 (two) times daily.  umeclidinium-vilanterol (ANORO ELLIPTA ) 62.5-25 MCG/ACT AEPB Inhale 1 puff by mouth once daily (Patient taking differently: Inhale 1 puff into the lungs at bedtime.) 60 each 11   valACYclovir  (VALTREX ) 500 MG tablet Take 500 mg by mouth every evening.     vitamin B-12 (CYANOCOBALAMIN ) 1000 MCG tablet Take 1,000 mcg by mouth daily.     No current facility-administered medications for this visit.     Assessment & Plan    Dyslipidemia Patient in office to review injection technique.  Currently doing injections in her right thigh.  Concerned about using abdomen because of mesh from previous hernia repair, and left leg because of issues after SFA stenting.  Has been using right thigh, but may be hitting muscle.   Reviewed injection technique and areas that she can use.  Explained that she should be fine to use left thigh as well as right, but to be sure aiming towards fat tissue and not muscle.  She gave herself injection in office and noted there was less pain than previous doses.    Will order Lp(a) to be done with lipid labs in 2 months.     Bricelyn Freestone, PharmD CPP [facility] [address]  [place], [place] [address] [phone]  03/31/2024, 12:08 PM

## 2024-03-31 NOTE — Assessment & Plan Note (Signed)
 Patient in office to review injection technique.  Currently doing injections in her right thigh.  Concerned about using abdomen because of mesh from previous hernia repair, and left leg because of issues after SFA stenting.  Has been using right thigh, but may be hitting muscle.   Reviewed injection technique and areas that she can use.  Explained that she should be fine to use left thigh as well as right, but to be sure aiming towards fat tissue and not muscle.  She gave herself injection in office and noted there was less pain than previous doses.    Will order Lp(a) to be done with lipid labs in 2 months.

## 2024-03-31 NOTE — Patient Instructions (Signed)
 Do injections into side of thigh, not straight on.    Repeat labs after 3 more doses - about 2 months.  Will add Lp(a) as well to see what improvement we can get with Repatha 

## 2024-04-02 ENCOUNTER — Encounter

## 2024-04-08 ENCOUNTER — Encounter: Payer: Self-pay | Admitting: Cardiovascular Disease

## 2024-04-08 ENCOUNTER — Encounter: Payer: Self-pay | Admitting: Pharmacist Clinician (PhC)/ Clinical Pharmacy Specialist

## 2024-04-09 ENCOUNTER — Encounter

## 2024-04-14 ENCOUNTER — Telehealth: Payer: Self-pay | Admitting: *Deleted

## 2024-04-14 ENCOUNTER — Encounter

## 2024-04-14 NOTE — Telephone Encounter (Signed)
 [name] called and spoke with Felipa Horsfall today.  She has had recent surgery/procedre and complications after.  She plans to return 6/23

## 2024-04-16 ENCOUNTER — Encounter: Payer: Self-pay | Admitting: *Deleted

## 2024-04-16 ENCOUNTER — Encounter

## 2024-04-16 ENCOUNTER — Other Ambulatory Visit: Payer: Self-pay | Admitting: Cardiovascular Disease

## 2024-04-16 DIAGNOSIS — I272 Pulmonary hypertension, unspecified: Secondary | ICD-10-CM

## 2024-04-16 NOTE — Progress Notes (Signed)
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 meal.    Personal Goal #2 Read labels and reduce sodium intake to below 2300mg . Ideally 1500mg  per day.    Comments Patient drinking mostly water , sometimes will have unsweetened tea. Has a glass or wine with dinner sometimes. She reports she has been following a mediterranean diet for years now. She tries to limit or avoid processed foods when possible. She says sometimes she skips lunch but then is very hungry come dinner time. Spoke with her about not skipping meal and instead making small meals or snacks that have high nutritional quality. Brainstormed several ideas together focusing on pairing complex carbs with protein or healthy fats. She likes fruits and veggies eating them often.      Intervention Plan   Intervention Prescribe, educate and counsel regarding individualized specific dietary modifications aiming towards targeted core  components such as weight, hypertension, lipid management, diabetes, heart failure and other comorbidities.;Nutrition handout(s) given to patient.    Expected Outcomes Short Term Goal: Understand basic principles of dietary content, such as calories, fat, sodium, cholesterol and nutrients.;Short Term Goal: A plan has been developed with personal nutrition goals set during dietitian appointment.;Long Term Goal: Adherence to prescribed nutrition plan.             Nutrition Assessments:  MEDIFICTS Score Key: >=70 Need to make dietary changes  40-70 Heart Healthy Diet <= 40 Therapeutic Level Cholesterol Diet   Picture Your Plate Scores: <40 Unhealthy dietary pattern with much room for improvement. 41-50 Dietary pattern unlikely to meet recommendations for good health and room for improvement. 51-60 More healthful dietary pattern, with some room for improvement.  >60 Healthy dietary pattern, although there may be some specific behaviors that could be improved.   Nutrition Goals Re-Evaluation:   Nutrition Goals Discharge (Final Nutrition Goals Re-Evaluation):   Psychosocial: Target Goals: Acknowledge presence or absence of significant depression and/or stress, maximize coping skills, provide positive support system. Participant is able to verbalize types and ability to use techniques and skills needed for reducing stress and depression.   Education: Stress, Anxiety, and Depression - Group verbal and visual presentation to define topics covered.  Reviews how body is impacted by stress, anxiety, and depression.  Also discusses healthy ways to reduce stress and to treat/manage anxiety and depression.  Written material given at graduation. Flowsheet Row Pulmonary Rehab from 01/02/2018 in [facility] Card[facility] and Pulmonary Rehab  Date 01/02/18  Educator [facility]  Instruction Review Code 1- [employer] Understanding       Education: Sleep Hygiene -Provides group verbal and written instruction about  how sleep can affect your health.  Define sleep hygiene, discuss sleep cycles and impact of sleep habits. Review good sleep hygiene tips.    Initial Review & Psychosocial Screening:  Initial Psych Review & Screening - 02/21/24 1606       Initial Review   Current issues with None Identified      Family Dynamics   Good Support System? Yes   daughter , son in law.     Barriers   Psychosocial barriers to participate in program There are no identifiable barriers or psychosocial needs.      Screening Interventions   Interventions To provide support and resources with identified psychosocial needs;Provide feedback about the scores to participant;Encouraged to exercise    Expected Outcomes  Short Term goal: Utilizing psychosocial counselor, staff and physician to assist with identification of specific Stressors or current issues interfering with healing process. Setting desired goal for each stressor or current issue identified.;Long Term Goal: Stressors or current issues are controlled or eliminated.;Short Term goal: Identification and review with participant of any Quality of Life or Depression concerns found by scoring the questionnaire.;Long Term goal: The participant improves quality of Life and PHQ9 Scores as seen by post scores and/or verbalization of changes             Quality of Life Scores:  Scores of 19 and below usually indicate a poorer quality of life in these areas.  A difference of  2-3 points is a clinically meaningful difference.  A difference of 2-3 points in the total score of the Quality of Life Index has been associated with significant improvement in overall quality of life, self-image, physical symptoms, and general health in studies assessing change in quality of life.  PHQ-9: Review Flowsheet       02/27/2024 12/25/2018 12/26/2017 09/17/2017  Depression screen PHQ 2/9  Decreased Interest 2 0 0 0  Down, Depressed, Hopeless 0 0 0 0  PHQ - 2 Score 2 0 0 0  Altered  sleeping 1 - 0 0  Tired, decreased energy 2 - 1 1  Change in appetite 0 - 0 0  Feeling bad or failure about yourself  0 - 0 0  Trouble concentrating 0 - 0 1  Moving slowly or fidgety/restless 0 - 0 0  Suicidal thoughts 0 - 0 0  PHQ-9 Score 5 - 1 2  Difficult doing work/chores Somewhat difficult - Not difficult at all Not difficult at all   Interpretation of Total Score  Total Score Depression Severity:  1-4 = Minimal depression, 5-9 = Mild depression, 10-14 = Moderate depression, 15-19 = Moderately severe depression, 20-27 = Severe depression   Psychosocial Evaluation and Intervention:  Psychosocial Evaluation - 02/21/24 1632       Psychosocial Evaluation & Interventions   Interventions Encouraged to exercise with the program and follow exercise prescription    Comments THere are no barriers to attending the program.   [facility] lives alone with a son that travels [place] there on the weekends. He and her daughterand son in law are her support.  She has completed Cardiac and Pulmnary rehab programs in the past and is looking forward to starting and building back her strength and stamina.  Sh ehas had several prcedures/surgeries since her last admission in the program and has not returned to her ususal exercise regimen.  No depression, stress or other concerns are noted.  She is ready to start.    Expected Outcomes STG  attend all scheduled sessions, gradual progression with exercie, attend education sessions.  LTG conrinue her exwercise progression after discharge.    Continue Psychosocial Services  Follow up required by staff             Psychosocial Re-Evaluation:   Psychosocial Discharge (Final Psychosocial Re-Evaluation):   Education: Education Goals: Education classes will be provided on a weekly basis, covering required topics. Participant will state understanding/return demonstration of topics presented.  Learning Barriers/Preferences:  Learning Barriers/Preferences -  02/21/24 1612       Learning Barriers/Preferences   Learning Barriers Hearing   has hearing aids   Learning Preferences None             General Pulmonary Education Topics:  Infection Prevention: - Provides verbal and written  material to individual with discussion of infection control including proper hand washing and proper equipment cleaning during exercise session. Flowsheet Row Pulmonary Rehab from 03/05/2024 in [facility] Cardiac and Pulmonary Rehab  Date 02/27/24  Educator MB  Instruction Review Code 1- Verbalizes Understanding       Falls Prevention: - Provides verbal and written material to individual with discussion of falls prevention and safety. Flowsheet Row Pulmonary Rehab from 03/05/2024 in [facility] Cardiac and Pulmonary Rehab  Date 02/27/24  Educator MB  Instruction Review Code 1- Verbalizes Understanding       Chronic Lung Disease Review: - Group verbal instruction with posters, models, PowerPoint presentations and videos,  to review new updates, new respiratory medications, new advancements in procedures and treatments. Providing information on websites and "800" numbers for continued self-education. Includes information about supplement oxygen , available portable oxygen  systems, continuous and intermittent flow rates, oxygen  safety, concentrators, and Medicare reimbursement for oxygen . Explanation of Pulmonary Drugs, including class, frequency, complications, importance of spacers, rinsing mouth after steroid MDI's, and proper cleaning methods for nebulizers. Review of basic lung anatomy and physiology related to function, structure, and complications of lung disease. Review of risk factors. Discussion about methods for diagnosing sleep apnea and types of masks and machines for OSA. Includes a review of the use of types of environmental controls: home humidity, furnaces, filters, dust mite/pet prevention, HEPA vacuums. Discussion about weather changes, air quality and the  benefits of nasal washing. Instruction on Warning signs, infection symptoms, calling MD promptly, preventive modes, and value of vaccinations. Review of effective airway clearance, coughing and/or vibration techniques. Emphasizing that all should Create an Action Plan. Written material given at graduation. Flowsheet Row Pulmonary Rehab from 01/02/2018 in [facility] Cardiac and Pulmonary Rehab  Date 11/14/17  Educator [facility]  Instruction Review Code 1- Verbalizes Understanding       AED/CPR: - Group verbal and written instruction with the use of models to demonstrate the basic use of the AED with the basic ABC's of resuscitation. Flowsheet Row Pulmonary Rehab from 01/02/2018 in [facility] Cardiac and Pulmonary Rehab  Date 11/02/17  Educator [facility]  Instruction Review Code 1- Verbalizes Understanding        Anatomy and Cardiac Procedures: - Group verbal and visual presentation and models provide information about basic cardiac anatomy and function. Reviews the testing methods done to diagnose heart disease and the outcomes of the test results. Describes the treatment choices: Medical Management, Angioplasty, or Coronary Bypass Surgery for treating various heart conditions including Myocardial Infarction, Angina, Valve Disease, and Cardiac Arrhythmias.  Written material given at graduation. Flowsheet Row Pulmonary Rehab from 01/02/2018 in [facility] Cardiac and Pulmonary Rehab  Date 12/12/17  Educator [facility]  Instruction Review Code 1- Verbalizes Understanding       Medication Safety: - Group verbal and visual instruction to review commonly prescribed medications for heart and lung disease. Reviews the medication, class of the drug, and side effects. Includes the steps to properly store meds and maintain the prescription regimen.  Written material given at graduation. Flowsheet Row Pulmonary Rehab from 01/02/2018 in [facility] Cardiac and Pulmonary Rehab  Date 12/28/17  Educator [facility]  Instruction Review Code 1- Verbalizes  Understanding       Other: -Provides group and verbal instruction on various topics (see comments) Flowsheet Row Pulmonary Rehab from 01/02/2018 in [facility] Cardiac and Pulmonary Rehab  Date 12/05/17  Educator [facility]  Instruction Review Code 1- Verbalizes Understanding  [SLEEP]       Knowledge Questionnaire Score:  Core Components/Risk Factors/Patient Goals at Admission:  Personal Goals and Risk Factors at Admission - 02/27/24 1559       Core Components/Risk Factors/Patient Goals on Admission    Weight Management Yes;Weight Loss;Weight Maintenance    Intervention Weight Management: Develop a combined nutrition and exercise program designed to reach desired caloric intake, while maintaining appropriate intake of nutrient and fiber, sodium and fats, and appropriate energy expenditure required for the weight goal.;Weight Management: Provide education and appropriate resources to help participant work on and attain dietary goals.    Admit Weight 155 lb 8 oz (70.5 kg)    Goal Weight: Short Term 154 lb (69.9 kg)    Goal Weight: Long Term 150 lb (68 kg)    Expected Outcomes Short Term: Continue to assess and modify interventions until short term weight is achieved;Long Term: Adherence to nutrition and physical activity/exercise program aimed toward attainment of established weight goal;Weight Maintenance: Understanding of the daily nutrition guidelines, which includes 25-35% calories from fat, 7% or less cal from saturated fats, less than 200mg  cholesterol, less than 1.5gm of sodium, & 5 or more servings of fruits and vegetables daily;Weight Loss: Understanding of general recommendations for a balanced deficit meal plan, which promotes 1-2 lb weight loss per week and includes a negative energy balance of [id number] kcal/d;Understanding recommendations for meals to include 15-35% energy as protein, 25-35% energy from fat, 35-60% energy from carbohydrates, less than 200mg  of dietary cholesterol, 20-35 gm  of total fiber daily;Understanding of distribution of calorie intake throughout the day with the consumption of 4-5 meals/snacks    Improve shortness of breath with ADL's Yes    Intervention Provide education, individualized exercise plan and daily activity instruction to help decrease symptoms of SOB with activities of daily living.    Expected Outcomes Short Term: Improve cardiorespiratory fitness to achieve a reduction of symptoms when performing ADLs;Long Term: Be able to perform more ADLs without symptoms or delay the onset of symptoms    Increase knowledge of respiratory medications and ability to use respiratory devices properly  Yes    Intervention Provide education and demonstration as needed of appropriate use of medications, inhalers, and oxygen  therapy.    Expected Outcomes Short Term: Achieves understanding of medications use. Understands that oxygen  is a medication prescribed by physician. Demonstrates appropriate use of inhaler and oxygen  therapy.;Long Term: Maintain appropriate use of medications, inhalers, and oxygen  therapy.    Hypertension Yes    Intervention Provide education on lifestyle modifcations including regular physical activity/exercise, weight management, moderate sodium restriction and increased consumption of fresh fruit, vegetables, and low fat dairy, alcohol  moderation, and smoking cessation.;Monitor prescription use compliance.    Expected Outcomes Short Term: Continued assessment and intervention until BP is < 140/62mm HG in hypertensive participants. < 130/72mm HG in hypertensive participants with diabetes, heart failure or chronic kidney disease.;Long Term: Maintenance of blood pressure at goal levels.    Lipids Yes    Intervention Provide education and support for participant on nutrition & aerobic/resistive exercise along with prescribed medications to achieve LDL 70mg , HDL >40mg .    Expected Outcomes Short Term: Participant states understanding of desired  cholesterol values and is compliant with medications prescribed. Participant is following exercise prescription and nutrition guidelines.;Long Term: Cholesterol controlled with medications as prescribed, with individualized exercise RX and with personalized nutrition plan. Value goals: LDL < 70mg , HDL > 40 mg.             Education:Diabetes - Individual verbal and written instruction to  review signs/symptoms of diabetes, desired ranges of glucose level fasting, after meals and with exercise. Acknowledge that pre and post exercise glucose checks will be done for 3 sessions at entry of program.   Know Your Numbers and Heart Failure: - Group verbal and visual instruction to discuss disease risk factors for cardiac and pulmonary disease and treatment options.  Reviews associated critical values for Overweight/Obesity, Hypertension, Cholesterol, and Diabetes.  Discusses basics of heart failure: signs/symptoms and treatments.  Introduces Heart Failure Zone chart for action plan for heart failure.  Written material given at graduation.   Core Components/Risk Factors/Patient Goals Review:    Core Components/Risk Factors/Patient Goals at Discharge (Final Review):    ITP Comments:  ITP Comments     Row Name 02/21/24 1631 02/27/24 1539 03/03/24 1600 03/19/24 1305 04/16/24 0938   ITP Comments Virtual orientation call completed today. shehas an appointment on Date: 02/27/2024  for EP eval and gym Orientation.  Documentation of diagnosis can be found in [facility] 10/09/2023 . Completed and gym orientation for respiratory care services. Initial ITP created and sent for review to Dr. Faud Aleskerov, Medical Director. First full day of exercise!  Patient was oriented to gym and equipment including functions, settings, policies, and procedures.  Patient's individual exercise prescription and treatment plan were reviewed.  All starting workloads were established based on the results of the 6 minute walk test  done at initial orientation visit.  The plan for exercise progression was also introduced and progression will be customized based on patient's performance and goals. 30 Day review completed. Medical Director ITP review done, changes made as directed, and signed approval by Medical Director.    new to program 30 Day review completed. Medical Director ITP review done, changes made as directed, and signed approval by Medical Director.            Comments:

## 2024-04-21 ENCOUNTER — Encounter

## 2024-04-23 ENCOUNTER — Encounter

## 2024-04-24 ENCOUNTER — Emergency Department

## 2024-04-24 ENCOUNTER — Encounter: Payer: Self-pay | Admitting: Emergency Medicine

## 2024-04-24 ENCOUNTER — Emergency Department
Admission: EM | Admit: 2024-04-24 | Discharge: 2024-04-24 | Disposition: A | Discharge status: Home / Self Care | Attending: Emergency Medicine | Admitting: Emergency Medicine

## 2024-04-24 ENCOUNTER — Other Ambulatory Visit: Payer: Self-pay

## 2024-04-24 DIAGNOSIS — Z951 Presence of aortocoronary bypass graft: Secondary | ICD-10-CM | POA: Insufficient documentation

## 2024-04-24 DIAGNOSIS — Z91040 Latex allergy status: Secondary | ICD-10-CM | POA: Diagnosis not present

## 2024-04-24 DIAGNOSIS — Z79899 Other long term (current) drug therapy: Secondary | ICD-10-CM | POA: Diagnosis not present

## 2024-04-24 DIAGNOSIS — Z85828 Personal history of other malignant neoplasm of skin: Secondary | ICD-10-CM | POA: Insufficient documentation

## 2024-04-24 DIAGNOSIS — I10 Essential (primary) hypertension: Secondary | ICD-10-CM | POA: Diagnosis not present

## 2024-04-24 DIAGNOSIS — Z7901 Long term (current) use of anticoagulants: Secondary | ICD-10-CM | POA: Insufficient documentation

## 2024-04-24 DIAGNOSIS — Z856 Personal history of leukemia: Secondary | ICD-10-CM | POA: Diagnosis not present

## 2024-04-24 DIAGNOSIS — J449 Chronic obstructive pulmonary disease, unspecified: Secondary | ICD-10-CM | POA: Insufficient documentation

## 2024-04-24 DIAGNOSIS — Z85118 Personal history of other malignant neoplasm of bronchus and lung: Secondary | ICD-10-CM | POA: Diagnosis not present

## 2024-04-24 DIAGNOSIS — S8011XA Contusion of right lower leg, initial encounter: Secondary | ICD-10-CM | POA: Diagnosis not present

## 2024-04-24 DIAGNOSIS — Z7982 Long term (current) use of aspirin: Secondary | ICD-10-CM | POA: Diagnosis not present

## 2024-04-24 DIAGNOSIS — T148XXA Other injury of unspecified body region, initial encounter: Secondary | ICD-10-CM

## 2024-04-24 DIAGNOSIS — I2510 Atherosclerotic heart disease of native coronary artery without angina pectoris: Secondary | ICD-10-CM | POA: Insufficient documentation

## 2024-04-24 DIAGNOSIS — M79604 Pain in right leg: Secondary | ICD-10-CM | POA: Insufficient documentation

## 2024-04-24 DIAGNOSIS — M7981 Nontraumatic hematoma of soft tissue: Secondary | ICD-10-CM | POA: Diagnosis not present

## 2024-04-24 NOTE — Discharge Instructions (Signed)
 You may take Tylenol  as needed for pain. Return to the ER for worsening symptoms, chest pain, dizziness or other concerns.
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
## 2024-04-24 NOTE — ED Triage Notes (Addendum)
 Pt with right lower calf pain and bruising that has become worse and more painful tonight. Hurts to rest on bed.  Pt is on eliquis 

## 2024-04-28 ENCOUNTER — Encounter

## 2024-04-30 ENCOUNTER — Encounter

## 2024-05-05 ENCOUNTER — Encounter: Payer: Self-pay | Admitting: Cardiovascular Disease

## 2024-05-05 ENCOUNTER — Encounter

## 2024-05-07 ENCOUNTER — Encounter

## 2024-05-08 DIAGNOSIS — E559 Vitamin D deficiency, unspecified: Secondary | ICD-10-CM | POA: Diagnosis not present

## 2024-05-08 DIAGNOSIS — I10 Essential (primary) hypertension: Secondary | ICD-10-CM | POA: Diagnosis not present

## 2024-05-08 DIAGNOSIS — C9110 Chronic lymphocytic leukemia of B-cell type not having achieved remission: Secondary | ICD-10-CM | POA: Diagnosis not present

## 2024-05-08 DIAGNOSIS — E785 Hyperlipidemia, unspecified: Secondary | ICD-10-CM | POA: Diagnosis not present

## 2024-05-08 DIAGNOSIS — I739 Peripheral vascular disease, unspecified: Secondary | ICD-10-CM | POA: Diagnosis not present

## 2024-05-08 DIAGNOSIS — I2510 Atherosclerotic heart disease of native coronary artery without angina pectoris: Secondary | ICD-10-CM | POA: Diagnosis not present

## 2024-05-08 DIAGNOSIS — Z933 Colostomy status: Secondary | ICD-10-CM | POA: Diagnosis not present

## 2024-05-08 DIAGNOSIS — I480 Paroxysmal atrial fibrillation: Secondary | ICD-10-CM | POA: Diagnosis not present

## 2024-05-12 ENCOUNTER — Encounter

## 2024-05-14 ENCOUNTER — Encounter: Attending: Pulmonary Disease

## 2024-05-14 DIAGNOSIS — I272 Pulmonary hypertension, unspecified: Secondary | ICD-10-CM | POA: Diagnosis not present

## 2024-05-14 NOTE — Progress Notes (Signed)
 30 Day review completed. Medical Director ITP review done, changes made as directed, and signed approval by Medical Director. ? ?

## 2024-05-14 NOTE — Progress Notes (Signed)
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 [facility] Cardiac and Pulmonary Rehab  Date 12/10/17  Educator CR  Instruction Review Code 1- Verbalizes Understanding    Biometrics:  Pre Biometrics - 02/27/24 1547       Pre Biometrics   Height 5' 3.58 (1.615 m)    Weight 155 lb 8 oz (70.5 kg)    Waist Circumference 37.5 inches    Hip Circumference 42 inches    Waist to Hip Ratio 0.89 %    BMI (  Calculated) 27.04    Single Leg Stand 5.3 seconds           Nutrition Therapy Plan and Nutrition Goals:  Nutrition Therapy & Goals - 02/27/24 1616       Nutrition Therapy   Diet Mediterranean    Protein (specify units) 70-90g    Fiber 25 grams    Whole Grain Foods 3 servings    Saturated Fats 15 max. grams    Fruits and Vegetables 5 servings/day    Sodium 2 grams      Personal Nutrition Goals   Nutrition Goal Eat 15-30gProtein and 30-60gCarbs at each meal.    Personal Goal #2 Read labels and reduce sodium intake to below 2300mg . Ideally 1500mg  per day.    Comments  Patient drinking mostly water , sometimes will have unsweetened tea. Has a glass or wine with dinner sometimes. She reports she has been following a mediterranean diet for years now. She tries to limit or avoid processed foods when possible. She says sometimes she skips lunch but then is very hungry come dinner time. Spoke with her about not skipping meal and instead making small meals or snacks that have high nutritional quality. Brainstormed several ideas together focusing on pairing complex carbs with protein or healthy fats. She likes fruits and veggies eating them often.      Intervention Plan   Intervention Prescribe, educate and counsel regarding individualized specific dietary modifications aiming towards targeted core components such as weight, hypertension, lipid management, diabetes, heart failure and other comorbidities.;Nutrition handout(s) given to patient.    Expected Outcomes Short Term Goal: Understand basic principles of dietary content, such as calories, fat, sodium, cholesterol and nutrients.;Short Term Goal: A plan has been developed with personal nutrition goals set during dietitian appointment.;Long Term Goal: Adherence to prescribed nutrition plan.          Nutrition Assessments:  MEDIFICTS Score Key: >=70 Need to make dietary changes  40-70 Heart Healthy Diet <= 40 Therapeutic Level Cholesterol Diet   Picture Your Plate Scores: <59 Unhealthy dietary pattern with much room for improvement. 41-50 Dietary pattern unlikely to meet recommendations for good health and room for improvement. 51-60 More healthful dietary pattern, with some room for improvement.  >60 Healthy dietary pattern, although there may be some specific behaviors that could be improved.   Nutrition Goals Re-Evaluation:   Nutrition Goals Discharge (Final Nutrition Goals Re-Evaluation):   Psychosocial: Target Goals: Acknowledge presence or absence of significant depression and/or stress, maximize  coping skills, provide positive support system. Participant is able to verbalize types and ability to use techniques and skills needed for reducing stress and depression.   Education: Stress, Anxiety, and Depression - Group verbal and visual presentation to define topics covered.  Reviews how body is impacted by stress, anxiety, and depression.  Also discusses healthy ways to reduce stress and to treat/manage anxiety and depression.  Written material given at graduation. Flowsheet Row Pulmonary Rehab from 01/02/2018 in [facility] Cardiac and Pulmonary Rehab  Date 01/02/18  Educator [facility]  Instruction Review Code 1- [employer] Understanding    Education: Sleep Hygiene -Provides group verbal and written instruction about how sleep can affect your health.  Define sleep hygiene, discuss sleep cycles and impact of sleep habits. Review good sleep hygiene tips.    Initial Review & Psychosocial Screening:  Initial Psych Review & Screening - 02/21/24 1606       Initial Review   Current issues with None Identified      Family Dynamics  Good Support System? Yes   daughter , son in law.     Barriers   Psychosocial barriers to participate in program There are no identifiable barriers or psychosocial needs.      Screening Interventions   Interventions To provide support and resources with identified psychosocial needs;Provide feedback about the scores to participant;Encouraged to exercise    Expected Outcomes Short Term goal: Utilizing psychosocial counselor, staff and physician to assist with identification of specific Stressors or current issues interfering with healing process. Setting desired goal for each stressor or current issue identified.;Long Term Goal: Stressors or current issues are controlled or eliminated.;Short Term goal: Identification and review with participant of any Quality of Life or Depression concerns found by scoring the questionnaire.;Long Term goal: The participant improves quality of  Life and PHQ9 Scores as seen by post scores and/or verbalization of changes          Quality of Life Scores:  Scores of 19 and below usually indicate a poorer quality of life in these areas.  A difference of  2-3 points is a clinically meaningful difference.  A difference of 2-3 points in the total score of the Quality of Life Index has been associated with significant improvement in overall quality of life, self-image, physical symptoms, and general health in studies assessing change in quality of life.  PHQ-9: Review Flowsheet       02/27/2024 12/25/2018 12/26/2017 09/17/2017  Depression screen PHQ 2/9  Decreased Interest 2 0 0 0  Down, Depressed, Hopeless 0 0 0 0  PHQ - 2 Score 2 0 0 0  Altered sleeping 1 - 0 0  Tired, decreased energy 2 - 1 1  Change in appetite 0 - 0 0  Feeling bad or failure about yourself  0 - 0 0  Trouble concentrating 0 - 0 1  Moving slowly or fidgety/restless 0 - 0 0  Suicidal thoughts 0 - 0 0  PHQ-9 Score 5 - 1 2  Difficult doing work/chores Somewhat difficult - Not difficult at all Not difficult at all   Interpretation of Total Score  Total Score Depression Severity:  1-4 = Minimal depression, 5-9 = Mild depression, 10-14 = Moderate depression, 15-19 = Moderately severe depression, 20-27 = Severe depression   Psychosocial Evaluation and Intervention:  Psychosocial Evaluation - 02/21/24 1632       Psychosocial Evaluation & Interventions   Interventions Encouraged to exercise with the program and follow exercise prescription    Comments THere are no barriers to attending the program.   [facility] lives alone with a son that travels [place] there on the weekends. He and her daughterand son in law are her support.  She has completed Cardiac and Pulmnary rehab programs in the past and is looking forward to starting and building back her strength and stamina.  Sh ehas had several prcedures/surgeries since her last admission in the program and has not returned to  her ususal exercise regimen.  No depression, stress or other concerns are noted.  She is ready to start.    Expected Outcomes STG  attend all scheduled sessions, gradual progression with exercie, attend education sessions.  LTG conrinue her exwercise progression after discharge.    Continue Psychosocial Services  Follow up required by staff          Psychosocial Re-Evaluation:   Psychosocial Discharge (Final Psychosocial Re-Evaluation):   Education: Education Goals: Education classes will be provided on a weekly basis, covering required topics. Participant will state understanding/return demonstration of  topics presented.  Learning Barriers/Preferences:  Learning Barriers/Preferences - 02/21/24 1612       Learning Barriers/Preferences   Learning Barriers Hearing   has hearing aids   Learning Preferences None          General Pulmonary Education Topics:  Infection Prevention: - Provides verbal and written material to individual with discussion of infection control including proper hand washing and proper equipment cleaning during exercise session. Flowsheet Row Pulmonary Rehab from 03/05/2024 in [facility] Cardiac and Pulmonary Rehab  Date 02/27/24  Educator MB  Instruction Review Code 1- Verbalizes Understanding    Falls Prevention: - Provides verbal and written material to individual with discussion of falls prevention and safety. Flowsheet Row Pulmonary Rehab from 03/05/2024 in [facility] Cardiac and Pulmonary Rehab  Date 02/27/24  Educator MB  Instruction Review Code 1- Verbalizes Understanding    Chronic Lung Disease Review: - Group verbal instruction with posters, models, PowerPoint presentations and videos,  to review new updates, new respiratory medications, new advancements in procedures and treatments. Providing information on websites and 800 numbers for continued self-education. Includes information about supplement oxygen , available portable oxygen  systems, continuous  and intermittent flow rates, oxygen  safety, concentrators, and Medicare reimbursement for oxygen . Explanation of Pulmonary Drugs, including class, frequency, complications, importance of spacers, rinsing mouth after steroid MDI's, and proper cleaning methods for nebulizers. Review of basic lung anatomy and physiology related to function, structure, and complications of lung disease. Review of risk factors. Discussion about methods for diagnosing sleep apnea and types of masks and machines for OSA. Includes a review of the use of types of environmental controls: home humidity, furnaces, filters, dust mite/pet prevention, HEPA vacuums. Discussion about weather changes, air quality and the benefits of nasal washing. Instruction on Warning signs, infection symptoms, calling MD promptly, preventive modes, and value of vaccinations. Review of effective airway clearance, coughing and/or vibration techniques. Emphasizing that all should Create an Action Plan. Written material given at graduation. Flowsheet Row Pulmonary Rehab from 01/02/2018 in [facility] Cardiac and Pulmonary Rehab  Date 11/14/17  Educator [facility]  Instruction Review Code 1- Verbalizes Understanding    AED/CPR: - Group verbal and written instruction with the use of models to demonstrate the basic use of the AED with the basic ABC's of resuscitation. Flowsheet Row Pulmonary Rehab from 01/02/2018 in [facility] Cardiac and Pulmonary Rehab  Date 11/02/17  Educator [facility]  Instruction Review Code 1- Verbalizes Understanding     Anatomy and Cardiac Procedures: - Group verbal and visual presentation and models provide information about basic cardiac anatomy and function. Reviews the testing methods done to diagnose heart disease and the outcomes of the test results. Describes the treatment choices: Medical Management, Angioplasty, or Coronary Bypass Surgery for treating various heart conditions including Myocardial Infarction, Angina, Valve Disease, and Cardiac  Arrhythmias.  Written material given at graduation. Flowsheet Row Pulmonary Rehab from 01/02/2018 in [facility] Cardiac and Pulmonary Rehab  Date 12/12/17  Educator [facility]  Instruction Review Code 1- Verbalizes Understanding    Medication Safety: - Group verbal and visual instruction to review commonly prescribed medications for heart and lung disease. Reviews the medication, class of the drug, and side effects. Includes the steps to properly store meds and maintain the prescription regimen.  Written material given at graduation. Flowsheet Row Pulmonary Rehab from 01/02/2018 in [facility] Cardiac and Pulmonary Rehab  Date 12/28/17  Educator [facility]  Instruction Review Code 1- Verbalizes Understanding    Other: -Provides group and verbal instruction on various topics (see comments) Flowsheet  Row Pulmonary Rehab from 01/02/2018 in [facility] Cardiac and Pulmonary Rehab  Date 12/05/17  Educator [facility]  Instruction Review Code 1- Verbalizes Understanding  [SLEEP]    Knowledge Questionnaire Score:    Core Components/Risk Factors/Patient Goals at Admission:  Personal Goals and Risk Factors at Admission - 02/27/24 1559       Core Components/Risk Factors/Patient Goals on Admission    Weight Management Yes;Weight Loss;Weight Maintenance    Intervention Weight Management: Develop a combined nutrition and exercise program designed to reach desired caloric intake, while maintaining appropriate intake of nutrient and fiber, sodium and fats, and appropriate energy expenditure required for the weight goal.;Weight Management: Provide education and appropriate resources to help participant work on and attain dietary goals.    Admit Weight 155 lb 8 oz (70.5 kg)    Goal Weight: Short Term 154 lb (69.9 kg)    Goal Weight: Long Term 150 lb (68 kg)    Expected Outcomes Short Term: Continue to assess and modify interventions until short term weight is achieved;Long Term: Adherence to nutrition and physical activity/exercise program  aimed toward attainment of established weight goal;Weight Maintenance: Understanding of the daily nutrition guidelines, which includes 25-35% calories from fat, 7% or less cal from saturated fats, less than 200mg  cholesterol, less than 1.5gm of sodium, & 5 or more servings of fruits and vegetables daily;Weight Loss: Understanding of general recommendations for a balanced deficit meal plan, which promotes 1-2 lb weight loss per week and includes a negative energy balance of [phone] kcal/d;Understanding recommendations for meals to include 15-35% energy as protein, 25-35% energy from fat, 35-60% energy from carbohydrates, less than 200mg  of dietary cholesterol, 20-35 gm of total fiber daily;Understanding of distribution of calorie intake throughout the day with the consumption of 4-5 meals/snacks    Improve shortness of breath with ADL's Yes    Intervention Provide education, individualized exercise plan and daily activity instruction to help decrease symptoms of SOB with activities of daily living.    Expected Outcomes Short Term: Improve cardiorespiratory fitness to achieve a reduction of symptoms when performing ADLs;Long Term: Be able to perform more ADLs without symptoms or delay the onset of symptoms    Increase knowledge of respiratory medications and ability to use respiratory devices properly  Yes    Intervention Provide education and demonstration as needed of appropriate use of medications, inhalers, and oxygen  therapy.    Expected Outcomes Short Term: Achieves understanding of medications use. Understands that oxygen  is a medication prescribed by physician. Demonstrates appropriate use of inhaler and oxygen  therapy.;Long Term: Maintain appropriate use of medications, inhalers, and oxygen  therapy.    Hypertension Yes    Intervention Provide education on lifestyle modifcations including regular physical activity/exercise, weight management, moderate sodium restriction and increased consumption of  fresh fruit, vegetables, and low fat dairy, alcohol  moderation, and smoking cessation.;Monitor prescription use compliance.    Expected Outcomes Short Term: Continued assessment and intervention until BP is < 140/55mm HG in hypertensive participants. < 130/52mm HG in hypertensive participants with diabetes, heart failure or chronic kidney disease.;Long Term: Maintenance of blood pressure at goal levels.    Lipids Yes    Intervention Provide education and support for participant on nutrition & aerobic/resistive exercise along with prescribed medications to achieve LDL 70mg , HDL >40mg .    Expected Outcomes Short Term: Participant states understanding of desired cholesterol values and is compliant with medications prescribed. Participant is following exercise prescription and nutrition guidelines.;Long Term: Cholesterol controlled with medications as prescribed, with individualized exercise  RX and with personalized nutrition plan. Value goals: LDL < 70mg , HDL > 40 mg.          Education:Diabetes - Individual verbal and written instruction to review signs/symptoms of diabetes, desired ranges of glucose level fasting, after meals and with exercise. Acknowledge that pre and post exercise glucose checks will be done for 3 sessions at entry of program.   Know Your Numbers and Heart Failure: - Group verbal and visual instruction to discuss disease risk factors for cardiac and pulmonary disease and treatment options.  Reviews associated critical values for Overweight/Obesity, Hypertension, Cholesterol, and Diabetes.  Discusses basics of heart failure: signs/symptoms and treatments.  Introduces Heart Failure Zone chart for action plan for heart failure.  Written material given at graduation.   Core Components/Risk Factors/Patient Goals Review:    Core Components/Risk Factors/Patient Goals at Discharge (Final Review):    ITP Comments:  ITP Comments     Row Name 02/21/24 1631 02/27/24 1539 03/03/24  1600 03/19/24 1305 04/16/24 0938   ITP Comments Virtual orientation call completed today. shehas an appointment on Date: 02/27/2024  for EP eval and gym Orientation.  Documentation of diagnosis can be found in [facility] 10/09/2023 . Completed and gym orientation for respiratory care services. Initial ITP created and sent for review to Dr. Faud Aleskerov, Medical Director. First full day of exercise!  Patient was oriented to gym and equipment including functions, settings, policies, and procedures.  Patient's individual exercise prescription and treatment plan were reviewed.  All starting workloads were established based on the results of the 6 minute walk test done at initial orientation visit.  The plan for exercise progression was also introduced and progression will be customized based on patient's performance and goals. 30 Day review completed. Medical Director ITP review done, changes made as directed, and signed approval by Medical Director.    new to program 30 Day review completed. Medical Director ITP review done, changes made as directed, and signed approval by Medical Director.    Row Name 05/14/24 0817           ITP Comments 30 Day review completed. Medical Director ITP review done, changes made as directed, and signed approval by Medical Director.          Comments: 30 day review

## 2024-05-14 NOTE — Progress Notes (Signed)
 Daily Session Note  Patient Details  Name: [name] MRN: [id number] Date of Birth: 31-Jul-1946 Referring Provider:   [name] Pulmonary Rehab from 02/27/2024 in [facility] Cardiac and Pulmonary Rehab  Referring Provider Annella Cough, MD    Encounter Date: 05/14/2024  Check In:  Session Check In - 05/14/24 1558       Check-In   Supervising physician immediately available to respond to emergencies See telemetry face sheet for immediately available ER MD    Location [facility]-Cardiac & Pulmonary Rehab    Staff Present Burnard Davenport RN,BSN,MPA;Joseph Rolinda RCP,RRT,BSRT;Kristen Coble RN,BC,MSN    Virtual Visit No    Medication changes reported     No    Fall or balance concerns reported    No    Tobacco Cessation No Change    Warm-up and Cool-down Performed on first and last piece of equipment    Resistance Training Performed Yes    VAD Patient? No    PAD/SET Patient? No      VAD patient   Has back up controller? No      Pain Assessment   Currently in Pain? No/denies             Social History   Tobacco Use  Smoking Status Former   Current packs/day: 0.00   Average packs/day: 1 pack/day for 40.0 years (40.0 ttl pk-yrs)   Types: Cigarettes   Start date: 09/15/1967   Quit date: 09/15/2007   Years since quitting: 16.6  Smokeless Tobacco Never  Tobacco Comments   Former smoker Quit 2008    Goals Met:  Independence with exercise equipment Exercise tolerated well No report of concerns or symptoms today Strength training completed today  Goals Unmet:  Not Applicable  Comments: Pt able to follow exercise prescription today without complaint.  Will continue to monitor for progression.    Dr. Oneil Pinal is Medical Director for [facility] Cardiac Rehabilitation.  Dr. Fuad Aleskerov is Medical Director for [facility] Pulmonary Rehabilitation.

## 2024-05-19 ENCOUNTER — Encounter: Admitting: *Deleted

## 2024-05-19 ENCOUNTER — Other Ambulatory Visit: Payer: Self-pay | Admitting: Cardiovascular Disease

## 2024-05-19 DIAGNOSIS — I272 Pulmonary hypertension, unspecified: Secondary | ICD-10-CM

## 2024-05-19 NOTE — Progress Notes (Signed)
 Daily Session Note  Patient Details  Name: [name] MRN: [id number] Date of Birth: 1946-02-14 Referring Provider:   [name] Pulmonary Rehab from 02/27/2024 in [facility] Cardiac and Pulmonary Rehab  Referring Provider Annella Cough, MD    Encounter Date: 05/19/2024  Check In:  Session Check In - 05/19/24 1540       Check-In   Supervising physician immediately available to respond to emergencies See telemetry face sheet for immediately available ER MD    Location [facility]-Cardiac & Pulmonary Rehab    Staff Present Hoy Rodney RN,BSN;Joseph Rolinda NORWOOD HARMAN Cecilie Delores, [place], RRT, CPFT;Kelly Dyane BS, ACSM CEP, Exercise Physiologist    Virtual Visit No    Medication changes reported     No    Fall or balance concerns reported    No    Warm-up and Cool-down Performed on first and last piece of equipment    Resistance Training Performed Yes    VAD Patient? No    PAD/SET Patient? No      VAD patient   Has back up controller? No      Pain Assessment   Currently in Pain? No/denies             Social History   Tobacco Use  Smoking Status Former   Current packs/day: 0.00   Average packs/day: 1 pack/day for 40.0 years (40.0 ttl pk-yrs)   Types: Cigarettes   Start date: 09/15/1967   Quit date: 09/15/2007   Years since quitting: 16.6  Smokeless Tobacco Never  Tobacco Comments   Former smoker Quit 2008    Goals Met:  Independence with exercise equipment Exercise tolerated well No report of concerns or symptoms today Strength training completed today  Goals Unmet:  Not Applicable  Comments: Pt able to follow exercise prescription today without complaint.  Will continue to monitor for progression.     Reviewed home exercise with pt today from 3:30 pm to 3:42 pm.  Pt plans to walk for exercise.  Reviewed THR, pulse, RPE, sign and symptoms, pulse oximetery and when to call 911 or MD.  Also discussed weather considerations and indoor options.  Pt voiced  understanding.    Dr. Oneil Pinal is Medical Director for [facility] Cardiac Rehabilitation.  Dr. Fuad Aleskerov is Medical Director for [facility] Pulmonary Rehabilitation.

## 2024-05-20 ENCOUNTER — Encounter: Payer: Self-pay | Admitting: Podiatry

## 2024-05-20 ENCOUNTER — Ambulatory Visit (INDEPENDENT_AMBULATORY_CARE_PROVIDER_SITE_OTHER): Admitting: Podiatry

## 2024-05-20 VITALS — Ht 63.0 in | Wt 155.0 lb

## 2024-05-20 DIAGNOSIS — Z79899 Other long term (current) drug therapy: Secondary | ICD-10-CM

## 2024-05-20 DIAGNOSIS — B351 Tinea unguium: Secondary | ICD-10-CM | POA: Diagnosis not present
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
## 2024-05-21 ENCOUNTER — Encounter: Admitting: *Deleted

## 2024-05-21 DIAGNOSIS — I272 Pulmonary hypertension, unspecified: Secondary | ICD-10-CM

## 2024-05-21 LAB — HEPATIC FUNCTION PANEL
ALT: 17 IU/L (ref 0–32)
AST: 23 IU/L (ref 0–40)
Albumin: 4.3 g/dL (ref 3.8–4.8)
Alkaline Phosphatase: 52 IU/L (ref 44–121)
Bilirubin Total: 0.3 mg/dL (ref 0.0–1.2)
Bilirubin, Direct: 0.17 mg/dL (ref 0.00–0.40)
Total Protein: 5.6 g/dL — ABNORMAL LOW (ref 6.0–8.5)

## 2024-05-21 MED ORDER — TERBINAFINE HCL 250 MG PO TABS: 250.0000 mg | ORAL_TABLET | Freq: Every day | ORAL | 0 refills | Status: DC

## 2024-05-21 NOTE — Addendum Note (Signed)
 Addended by: Kal Chait on: 05/21/2024 08:25 AM   Modules accepted: Orders

## 2024-05-21 NOTE — Progress Notes (Signed)
 Daily Session Note  Patient Details  Name: [name] MRN: [id number] Date of Birth: 1946-06-14 Referring Provider:   [name] from 02/27/2024 in [facility] Cardiac and Pulmonary Rehab  Referring Provider Annella Cough, MD    Encounter Date: 05/21/2024  Check In:  Session Check In - 05/21/24 1600       Check-In   Supervising physician immediately available to respond to emergencies See telemetry face sheet for immediately available ER MD    Location [facility]-Cardiac & Pulmonary Rehab    Staff Present Hoy Rodney RN,BSN;Joseph Rolinda RCP,RRT,BSRT;Kelly Bollinger RN,BSN,MPA;Kelly Dyane BS, ACSM CEP, Exercise Physiologist    Virtual Visit No    Medication changes reported     No    Fall or balance concerns reported    No    Warm-up and Cool-down Performed on first and last piece of equipment    Resistance Training Performed Yes    VAD Patient? No    PAD/SET Patient? No      VAD patient   Has back up controller? No      Pain Assessment   Currently in Pain? No/denies             Social History   Tobacco Use  Smoking Status Former   Current packs/day: 0.00   Average packs/day: 1 pack/day for 40.0 years (40.0 ttl pk-yrs)   Types: Cigarettes   Start date: 09/15/1967   Quit date: 09/15/2007   Years since quitting: 16.6  Smokeless Tobacco Never  Tobacco Comments   Former smoker Quit 2008    Goals Met:  Independence with exercise equipment Exercise tolerated well No report of concerns or symptoms today Strength training completed today  Goals Unmet:  Not Applicable  Comments: Pt able to follow exercise prescription today without complaint.  Will continue to monitor for progression.    Dr. Oneil Pinal is Medical Director for [facility] Cardiac Rehabilitation.  Dr. Fuad Aleskerov is Medical Director for [facility] Pulmonary Rehabilitation.

## 2024-05-22 ENCOUNTER — Encounter: Admitting: *Deleted

## 2024-05-22 DIAGNOSIS — I272 Pulmonary hypertension, unspecified: Secondary | ICD-10-CM | POA: Diagnosis not present

## 2024-05-22 NOTE — Progress Notes (Signed)
 Daily Session Note  Patient Details  Name: [name] MRN: [id number] Date of Birth: February 03, 1946 Referring Provider:   [name] from 02/27/2024 in [facility] Cardiac and Pulmonary Rehab  Referring Provider Annella Cough, MD    Encounter Date: 05/22/2024  Check In:  Session Check In - 05/22/24 1546       Check-In   Supervising physician immediately available to respond to emergencies See telemetry face sheet for immediately available ER MD    Location [facility]-Cardiac & Pulmonary Rehab    Staff Present Hoy Rodney RN,BSN;Joseph [facility] BS, Exercise Physiologist;Kristen Coble RN,BC,MSN    Virtual Visit No    Medication changes reported     No    Fall or balance concerns reported    No    Warm-up and Cool-down Performed on first and last piece of equipment    Resistance Training Performed Yes    VAD Patient? No    PAD/SET Patient? No      VAD patient   Has back up controller? No      Pain Assessment   Currently in Pain? No/denies             Social History   Tobacco Use  Smoking Status Former   Current packs/day: 0.00   Average packs/day: 1 pack/day for 40.0 years (40.0 ttl pk-yrs)   Types: Cigarettes   Start date: 09/15/1967   Quit date: 09/15/2007   Years since quitting: 16.6  Smokeless Tobacco Never  Tobacco Comments   Former smoker Quit 2008    Goals Met:  Independence with exercise equipment Exercise tolerated well No report of concerns or symptoms today Strength training completed today  Goals Unmet:  Not Applicable  Comments: Pt able to follow exercise prescription today without complaint.  Will continue to monitor for progression.    Dr. Oneil Pinal is Medical Director for [facility] Cardiac Rehabilitation.  Dr. Fuad Aleskerov is Medical Director for [facility] Pulmonary Rehabilitation.

## 2024-05-26 ENCOUNTER — Encounter: Admitting: *Deleted

## 2024-05-26 DIAGNOSIS — I272 Pulmonary hypertension, unspecified: Secondary | ICD-10-CM

## 2024-05-26 NOTE — Progress Notes (Signed)
 Daily Session Note  Patient Details  Name: [name] MRN: [id number] Date of Birth: 1946-10-16 Referring Provider:   [name] [facility] Rehab from 02/27/2024 in [facility] Cardiac and Pulmonary Rehab  Referring Provider Annella Cough, MD    Encounter Date: 05/26/2024  Check In:  Session Check In - 05/26/24 1537       Check-In   Supervising physician immediately available to respond to emergencies See telemetry face sheet for immediately available ER MD    Location [facility]-Cardiac & Pulmonary Rehab    Staff Present Hoy Rodney RN,BSN;Joseph Rolinda RCP,RRT,BSRT;Kelly Bollinger RN,BSN,MPA;Margaret Best, MS, Exercise Physiologist    Virtual Visit No    Medication changes reported     No    Fall or balance concerns reported    No    Warm-up and Cool-down Performed on first and last piece of equipment    Resistance Training Performed Yes    VAD Patient? No    PAD/SET Patient? No      VAD patient   Has back up controller? No      Pain Assessment   Currently in Pain? No/denies             Social History   Tobacco Use  Smoking Status Former   Current packs/day: 0.00   Average packs/day: 1 pack/day for 40.0 years (40.0 ttl pk-yrs)   Types: Cigarettes   Start date: 09/15/1967   Quit date: 09/15/2007   Years since quitting: 16.7  Smokeless Tobacco Never  Tobacco Comments   Former smoker Quit 2008    Goals Met:  Independence with exercise equipment Exercise tolerated well No report of concerns or symptoms today Strength training completed today  Goals Unmet:  Not Applicable  Comments: Pt able to follow exercise prescription today without complaint.  Will continue to monitor for progression.    Dr. Oneil Pinal is Medical Director for [facility] Cardiac Rehabilitation.  Dr. Fuad Aleskerov is Medical Director for [facility] Pulmonary Rehabilitation.

## 2024-05-28 ENCOUNTER — Ambulatory Visit: Admitting: Cardiology

## 2024-05-28 ENCOUNTER — Encounter

## 2024-05-28 DIAGNOSIS — I272 Pulmonary hypertension, unspecified: Secondary | ICD-10-CM

## 2024-05-28 NOTE — Progress Notes (Signed)
 Daily Session Note  Patient Details  Name: [name] MRN: [id number] Date of Birth: 1946/10/23 Referring Provider:   [name] [facility] Rehab from 02/27/2024 in [facility] Cardiac and Pulmonary Rehab  Referring Provider Annella Cough, MD    Encounter Date: 05/28/2024  Check In:  Session Check In - 05/28/24 1524       Check-In   Supervising physician immediately available to respond to emergencies See telemetry face sheet for immediately available ER MD    Location [facility]-Cardiac & Pulmonary Rehab    Staff Present Burnard Davenport RN,BSN,MPA;Joseph [facility] Dyane BS, ACSM CEP, Exercise Physiologist;Jason Elnor RDN,LDN    Virtual Visit No    Medication changes reported     No    Fall or balance concerns reported    No    Tobacco Cessation No Change    Warm-up and Cool-down Performed on first and last piece of equipment    Resistance Training Performed Yes    VAD Patient? No    PAD/SET Patient? No      VAD patient   Has back up controller? No      Pain Assessment   Currently in Pain? No/denies             Social History   Tobacco Use  Smoking Status Former   Current packs/day: 0.00   Average packs/day: 1 pack/day for 40.0 years (40.0 ttl pk-yrs)   Types: Cigarettes   Start date: 09/15/1967   Quit date: 09/15/2007   Years since quitting: 16.7  Smokeless Tobacco Never  Tobacco Comments   Former smoker Quit 2008    Goals Met:  Independence with exercise equipment Exercise tolerated well No report of concerns or symptoms today Strength training completed today  Goals Unmet:  Not Applicable  Comments: Pt able to follow exercise prescription today without complaint.  Will continue to monitor for progression.    Dr. Oneil Pinal is Medical Director for [facility] Cardiac Rehabilitation.  Dr. Fuad Aleskerov is Medical Director for [facility] Pulmonary Rehabilitation.

## 2024-05-29 ENCOUNTER — Encounter: Admitting: *Deleted

## 2024-05-29 DIAGNOSIS — I272 Pulmonary hypertension, unspecified: Secondary | ICD-10-CM

## 2024-05-29 NOTE — Progress Notes (Signed)
 Daily Session Note  Patient Details  Name: [name] MRN: [id number] Date of Birth: 1946-08-15 Referring Provider:   [name] [facility] from 02/27/2024 in [facility] Cardiac and Pulmonary Rehab  Referring Provider Annella Cough, MD    Encounter Date: 05/29/2024  Check In:  Session Check In - 05/29/24 1551       Check-In   Supervising physician immediately available to respond to emergencies See telemetry face sheet for immediately available ER MD    Location [facility]-Cardiac & Pulmonary Rehab    Staff Present Hoy Rodney RN,BSN;Joseph [facility] BS, Exercise Physiologist;Kristen Coble RN,BC,MSN    Virtual Visit No    Medication changes reported     No    Warm-up and Cool-down Performed on first and last piece of equipment    Resistance Training Performed Yes    VAD Patient? No    PAD/SET Patient? No      VAD patient   Has back up controller? No      Pain Assessment   Currently in Pain? No/denies             Social History   Tobacco Use  Smoking Status Former   Current packs/day: 0.00   Average packs/day: 1 pack/day for 40.0 years (40.0 ttl pk-yrs)   Types: Cigarettes   Start date: 09/15/1967   Quit date: 09/15/2007   Years since quitting: 16.7  Smokeless Tobacco Never  Tobacco Comments   Former smoker Quit 2008    Goals Met:  Independence with exercise equipment Exercise tolerated well No report of concerns or symptoms today Strength training completed today  Goals Unmet:  Not Applicable  Comments: Pt able to follow exercise prescription today without complaint.  Will continue to monitor for progression.    Dr. Oneil Pinal is Medical Director for [facility] Cardiac Rehabilitation.  Dr. Fuad Aleskerov is Medical Director for [facility] Pulmonary Rehabilitation.

## 2024-05-31 ENCOUNTER — Ambulatory Visit: Payer: Self-pay | Admitting: Cardiovascular Disease

## 2024-05-31 ENCOUNTER — Other Ambulatory Visit
Admission: RE | Admit: 2024-05-31 | Discharge: 2024-05-31 | Disposition: A | Discharge status: Home / Self Care | Attending: Cardiovascular Disease | Admitting: Cardiovascular Disease

## 2024-05-31 DIAGNOSIS — E785 Hyperlipidemia, unspecified: Secondary | ICD-10-CM | POA: Insufficient documentation

## 2024-05-31 LAB — LIPID PANEL
Cholesterol: 94 mg/dL (ref 0–200)
HDL: 69 mg/dL (ref >40)
LDL Cholesterol: 17 mg/dL (ref 0–99)
Total CHOL/HDL Ratio: 1.4 ratio
Triglycerides: 39 mg/dL (ref <150)
VLDL: 8 mg/dL (ref 0–40)

## 2024-06-02 ENCOUNTER — Encounter: Admitting: *Deleted

## 2024-06-02 DIAGNOSIS — I272 Pulmonary hypertension, unspecified: Secondary | ICD-10-CM | POA: Diagnosis not present

## 2024-06-02 LAB — LIPOPROTEIN A (LPA): Lipoprotein (a): 170.2 nmol/L — ABNORMAL HIGH (ref <75.0)

## 2024-06-02 NOTE — Progress Notes (Signed)
 Daily Session Note  Patient Details  Name: [name] MRN: [id number] Date of Birth: 08-23-1946 Referring Provider:   [name] [facility] Rehab from 02/27/2024 in [facility] Cardiac and Pulmonary Rehab  Referring Provider Annella Cough, MD    Encounter Date: 06/02/2024  Check In:  Session Check In - 06/02/24 1531       Check-In   Supervising physician immediately available to respond to emergencies See telemetry face sheet for immediately available ER MD    Location [facility]-Cardiac & Pulmonary Rehab    Staff Present Hoy Rodney RN,BSN;Joseph [facility] BS, Exercise Physiologist;Margaret Best, MS, Exercise Physiologist    Virtual Visit No    Medication changes reported     No    Fall or balance concerns reported    No    Warm-up and Cool-down Performed on first and last piece of equipment    Resistance Training Performed Yes    VAD Patient? No    PAD/SET Patient? No      VAD patient   Has back up controller? No      Pain Assessment   Currently in Pain? No/denies             Social History   Tobacco Use  Smoking Status Former   Current packs/day: 0.00   Average packs/day: 1 pack/day for 40.0 years (40.0 ttl pk-yrs)   Types: Cigarettes   Start date: 09/15/1967   Quit date: 09/15/2007   Years since quitting: 16.7  Smokeless Tobacco Never  Tobacco Comments   Former smoker Quit 2008    Goals Met:  Independence with exercise equipment Exercise tolerated well No report of concerns or symptoms today Strength training completed today  Goals Unmet:  Not Applicable  Comments: Pt able to follow exercise prescription today without complaint.  Will continue to monitor for progression.    Dr. Oneil Pinal is Medical Director for [facility] Cardiac Rehabilitation.  Dr. Fuad Aleskerov is Medical Director for [facility] Pulmonary Rehabilitation.

## 2024-06-04 ENCOUNTER — Encounter: Admitting: *Deleted

## 2024-06-04 DIAGNOSIS — I272 Pulmonary hypertension, unspecified: Secondary | ICD-10-CM | POA: Diagnosis not present

## 2024-06-04 NOTE — Progress Notes (Signed)
 Daily Session Note  Patient Details  Name: [name] MRN: [id number] Date of Birth: 12/19/1945 Referring Provider:   [name] [facility] Rehab from 02/27/2024 in [facility] Cardiac and Pulmonary Rehab  Referring Provider Annella Cough, MD    Encounter Date: 06/04/2024  Check In:  Session Check In - 06/04/24 1529       Check-In   Supervising physician immediately available to respond to emergencies See telemetry face sheet for immediately available ER MD    Location [facility]-Cardiac & Pulmonary Rehab    Staff Present Hoy Rodney RN,BSN;Joseph Rolinda RCP,RRT,BSRT;Kelly Bollinger RN,BSN,MPA;Kelly Dyane BS, ACSM CEP, Exercise Physiologist    Virtual Visit No    Medication changes reported     No    Fall or balance concerns reported    No    Warm-up and Cool-down Performed on first and last piece of equipment    Resistance Training Performed Yes    VAD Patient? No    PAD/SET Patient? No      VAD patient   Has back up controller? No      Pain Assessment   Currently in Pain? No/denies             Social History   Tobacco Use  Smoking Status Former   Current packs/day: 0.00   Average packs/day: 1 pack/day for 40.0 years (40.0 ttl pk-yrs)   Types: Cigarettes   Start date: 09/15/1967   Quit date: 09/15/2007   Years since quitting: 16.7  Smokeless Tobacco Never  Tobacco Comments   Former smoker Quit 2008    Goals Met:  Independence with exercise equipment Exercise tolerated well No report of concerns or symptoms today Strength training completed today  Goals Unmet:  Not Applicable  Comments: Pt able to follow exercise prescription today without complaint.  Will continue to monitor for progression.    Dr. Oneil Pinal is Medical Director for [facility] Cardiac Rehabilitation.  Dr. Fuad Aleskerov is Medical Director for [facility] Pulmonary Rehabilitation.

## 2024-06-05 ENCOUNTER — Encounter: Payer: Self-pay | Admitting: Pulmonary Disease

## 2024-06-05 ENCOUNTER — Encounter

## 2024-06-05 ENCOUNTER — Ambulatory Visit: Admitting: Pulmonary Disease

## 2024-06-05 DIAGNOSIS — I272 Pulmonary hypertension, unspecified: Secondary | ICD-10-CM

## 2024-06-05 NOTE — Progress Notes (Signed)
 Daily Session Note  Patient Details  Name: OMAYRA TULLOCH MRN: [id number] Date of Birth: May 28, 1946 Referring Provider:   [name] [facility] Rehab from 02/27/2024 in [facility] Cardiac and Pulmonary Rehab  Referring Provider Annella Cough, MD    Encounter Date: 06/05/2024  Check In:  Session Check In - 06/05/24 1545       Check-In   Supervising physician immediately available to respond to emergencies See telemetry face sheet for immediately available ER MD    Location [facility]-Cardiac & Pulmonary Rehab    Staff Present Josette Shallow RN,BC,MSN;Noah Tickle, BS, Exercise Physiologist;Maxon Conetta BS, Exercise Physiologist;Meredith Tressa RN,BSN    Virtual Visit No    Medication changes reported     No    Fall or balance concerns reported    No    Warm-up and Cool-down Performed on first and last piece of equipment    Resistance Training Performed Yes    VAD Patient? No    PAD/SET Patient? No      VAD patient   Has back up controller? No      Pain Assessment   Currently in Pain? No/denies    Multiple Pain Sites No             Social History   Tobacco Use  Smoking Status Former   Current packs/day: 0.00   Average packs/day: 1 pack/day for 40.0 years (40.0 ttl pk-yrs)   Types: Cigarettes   Start date: 09/15/1967   Quit date: 09/15/2007   Years since quitting: 16.7  Smokeless Tobacco Never  Tobacco Comments   Former smoker Quit 2008    Goals Met:  Independence with exercise equipment Exercise tolerated well No report of concerns or symptoms today Strength training completed today  Goals Unmet:  Not Applicable  Comments: Pt able to follow exercise prescription today without complaint.  Will continue to monitor for progression.   Dr. Oneil Pinal is Medical Director for [facility] Cardiac Rehabilitation.  Dr. Fuad Aleskerov is Medical Director for [facility] Pulmonary Rehabilitation.

## 2024-06-06 NOTE — Telephone Encounter (Signed)
 FYI

## 2024-06-09 ENCOUNTER — Encounter: Admitting: *Deleted

## 2024-06-09 DIAGNOSIS — I272 Pulmonary hypertension, unspecified: Secondary | ICD-10-CM | POA: Diagnosis not present

## 2024-06-09 NOTE — Progress Notes (Signed)
 Daily Session Note  Patient Details  Name: [name] MRN: [id number] Date of Birth: 31-Jul-1946 Referring Provider:   [name] from 02/27/2024 in [facility] Re[facility]  Referring Provider Annella Cough, MD    Encounter Date: 06/09/2024  Check In:  Session Check In - 06/09/24 1540       Check-In   Supervising physician immediately available to respond to emergencies See telemetry face sheet for immediately available ER MD    Location [facility]-Cardiac & Pulmonary Rehab    Staff Present Hoy Rodney RN,BSN;Joseph [facility] RCP,RRT,BSRT;Margaret Best, MS, Exercise Physiologist;Maxon Conetta BS, Exercise Physiologist    Virtual Visit No    Medication changes reported     No    Fall or balance concerns reported    No    Warm-up and Cool-down Performed on first and last piece of equipment    Resistance Training Performed Yes    VAD Patient? No    PAD/SET Patient? No      VAD patient   Has back up controller? No      Pain Assessment   Currently in Pain? No/denies             Social History   Tobacco Use  Smoking Status Former   Current packs/day: 0.00   Average packs/day: 1 pack/day for 40.0 years (40.0 ttl pk-yrs)   Types: Cigarettes   Start date: 09/15/1967   Quit date: 09/15/2007   Years since quitting: 16.7  Smokeless Tobacco Never  Tobacco Comments   Former smoker Quit 2008    Goals Met:  Independence with exercise equipment Exercise tolerated well No report of concerns or symptoms today Strength training completed today  Goals Unmet:  Not Applicable  Comments: Pt able to follow exercise prescription today without complaint.  Will continue to monitor for progression.    Dr. Oneil Pinal is Medical Director for [facility] Cardiac Rehabilitation.  Dr. Fuad Aleskerov is Medical Director for [facility] Pulmonary Rehabilitation.

## 2024-06-11 ENCOUNTER — Encounter: Admitting: *Deleted

## 2024-06-11 DIAGNOSIS — I272 Pulmonary hypertension, unspecified: Secondary | ICD-10-CM | POA: Diagnosis not present

## 2024-06-11 NOTE — Progress Notes (Signed)
 Daily Session Note  Patient Details  Name: [name] MRN: [id number] Date of Birth: 11/01/1946 Referring Provider:   [name] from 02/27/2024 in [facility] Cardiac and Pulmonary Rehab  Referring Provider Annella Cough, MD    Encounter Date: 06/11/2024  Check In:  Session Check In - 06/11/24 1608       Check-In   Supervising physician immediately available to respond to emergencies See telemetry face sheet for immediately available ER MD    Location [facility]-Cardiac & Pulmonary Rehab    Staff Present Hoy Rodney RN,BSN;Joseph [facility] Dyane BS, ACSM CEP, Exercise Physiologist;Kelly Bollinger [facility]    Virtual Visit No    Medication changes reported     No    Fall or balance concerns reported    No    Warm-up and Cool-down Performed on first and last piece of equipment    Resistance Training Performed Yes    VAD Patient? No    PAD/SET Patient? No      Pain Assessment   Currently in Pain? No/denies             Social History   Tobacco Use  Smoking Status Former   Current packs/day: 0.00   Average packs/day: 1 pack/day for 40.0 years (40.0 ttl pk-yrs)   Types: Cigarettes   Start date: 09/15/1967   Quit date: 09/15/2007   Years since quitting: 16.7  Smokeless Tobacco Never  Tobacco Comments   Former smoker Quit 2008    Goals Met:  Independence with exercise equipment Exercise tolerated well No report of concerns or symptoms today Strength training completed today  Goals Unmet:  Not Applicable  Comments: Pt able to follow exercise prescription today without complaint.  Will continue to monitor for progression.    Dr. Oneil Pinal is Medical Director for [facility] Cardiac Rehabilitation.  Dr. Fuad Aleskerov is Medical Director for [facility] Pulmonary Rehabilitation.

## 2024-06-11 NOTE — Progress Notes (Signed)
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 Activity;Increase Strength and Stamina;Understanding of Exercise Prescription Increase Physical Activity;Increase Strength and Stamina;Understanding of Exercise Prescription Increase Physical Activity;Increase Strength and Stamina;Understanding of Exercise Prescription   Comments Reviewed RPE and dyspnea scale, THR and program prescription with pt today.  Pt voiced understanding and was given a copy of goals to take home. [name] is off to a good start in the program. She did well during her first three sessions on the treadmill at a speed of 2.3 mph with a 1% incline. She also did well at level 3 on the T4 nustep and level 1 on both the T5 nustep and biostep. We will continue to monitor her progress in the program. [name] is doing well in rehab. She maintained her workload on the treadmill with a speed of 2.3 mph and incline of 1%. She also maintained level 3 on the T4 nustep. We will continue to monitor her progress in the program. [name] has not  attended rehab since 4/30. We will attempt to reach out to determine her progress in the program. [name] has not attended the program since 4/30. We have reached out and she states that she plans to return 6/23.   Expected Outcomes Short: Use RPE daily to regulate intensity.  Long: Follow program prescription in THR. Short: Continue to follow current exercise prescription. Long: Continue exercise to improve strength and stamina. Short: Progressively increase treadmill and T4 nustep workloads. Long: Continue exercise to improve strength and stamina. Short: return to rehab. Long: Continue exercise to improve strength and stamina. Short: return to rehab. Long: Continue exercise to improve strength and stamina.    Row Name 05/08/24 1603 05/19/24 1608 05/22/24 1549 06/04/24 1605       Exercise Goal Re-Evaluation   Exercise Goals Review Increase Physical Activity;Increase Strength and Stamina;Understanding of Exercise Prescription Increase Physical Activity;Able to understand and use rate of perceived exertion (RPE) scale;Knowledge and understanding of Target Heart Rate Range (THRR);Understanding of Exercise Prescription;Increase Strength and Stamina;Able to understand and use Dyspnea scale;Able to check pulse independently Increase Physical Activity;Understanding of Exercise Prescription;Increase Strength and Stamina Increase Physical Activity;Understanding of Exercise Prescription;Increase Strength and Stamina    Comments [name] has not attended the program since 4/30. She previously stated that she planned to return to the program on 6/23, but has not returned to the program. We will contact her to determine a new return date. We will continue to monitor her progress when she returns to the program. Reviewed home exercise with pt today from 3:30 pm to 3:42 pm.  Pt plans to walk for exercise.  Reviewed THR, pulse, RPE, sign and symptoms, pulse oximetery and when to call 911 or MD.  Also discussed weather  considerations and indoor options.  Pt voiced understanding. [name] attended one session in this 2 week review. She increased her workload on the treadmill to a speed of 2.6 mph and incline of 0.5%. She maintained level 3 on the T4 nustep. We will continue to monitor her progress in the program. [name] is doing well in rehab. She has been able to maintain her current treadmill workload at a speed of 2. and 0.5% incline. She was also able to maintain a level 3 on the T4 nustep. We will continue to monitor her progress in the program.    Expected Outcomes Short: Return to rehab when appropriate. Long: Continue exercise to  improve strength and stamina. Short: add 1-2 days of walking at home on off days of rehab. Long: maintain independent exercise routine upon graduation from rehab. Short: Attend rehab more consistently. Long: Continue exercise to improve strength and stamina. Short: Continue to follow exercise prescription. Long: Continue exercise to improve strength and stamina.       Discharge Exercise Prescription (Final Exercise Prescription Changes):  Exercise Prescription Changes - 06/04/24 1600       Response to Exercise   Blood Pressure (Admit) 122/62    Blood Pressure (Exercise) 134/64    Blood Pressure (Exit) 116/60    Heart Rate (Admit) 80 bpm    Heart Rate (Exercise) 113 bpm    Heart Rate (Exit) 86 bpm    Oxygen  Saturation (Admit) 92 %    Oxygen  Saturation (Exercise) 91 %    Oxygen  Saturation (Exit) 94 %    Rating of Perceived Exertion (Exercise) 16    Perceived Dyspnea (Exercise) 3    Symptoms none    Duration Continue with 30 min of aerobic exercise without signs/symptoms of physical distress.    Intensity THRR unchanged      Progression   Progression Continue to progress workloads to maintain intensity without signs/symptoms of physical distress.    Average METs 2.78      Resistance Training   Training Prescription Yes    Weight 3 lb    Reps 10-15      Interval  Training   Interval Training No      Treadmill   MPH 2.6    Grade 0.5    Minutes 15    METs 3.17      NuStep   Level 3    Minutes 15    METs 2.5      T5 Nustep   Level 2    Minutes 15    METs 2      Home Exercise Plan   Plans to continue exercise at Home (comment)   walking   Frequency Add 2 additional days to program exercise sessions.    Initial Home Exercises Provided 05/19/24      Oxygen    Maintain Oxygen  Saturation 88% or higher          Nutrition:  Target Goals: Understanding of nutrition guidelines, daily intake of sodium 1500mg , cholesterol 200mg , calories 30% from fat and 7% or less from saturated fats, daily to have 5 or more servings of fruits and vegetables.  Education: All About Nutrition: -Group instruction provided by verbal, written material, interactive activities, discussions, models, and posters to present general guidelines for heart healthy nutrition including fat, fiber, MyPlate, the role of sodium in heart healthy nutrition, utilization of the nutrition label, and utilization of this knowledge for meal planning. Follow up email sent as well. Written material given at graduation. Flowsheet Row Pulmonary Rehab from 01/02/2018 in [facility] Pulmonary Rehab  Date 12/10/17  Educator CR  Instruction Review Code 1- Verbalizes Understanding    Biometrics:  Pre Biometrics - 02/27/24 1547       Pre Biometrics   Height 5' 3.58 (1.615 m)    Weight 155 lb 8 oz (70.5 kg)    Waist Circumference 37.5 inches    Hip Circumference 42 inches    Waist to Hip Ratio 0.89 %    BMI (Calculated) 27.04    Single Leg Stand 5.3 seconds           Nutrition Therapy Plan and Nutrition Goals:  Nutrition Therapy &  Goals - 02/27/24 1616       Nutrition Therapy   Diet Mediterranean    Protein (specify units) 70-90g    Fiber 25 grams    Whole Grain Foods 3 servings    Saturated Fats 15 max. grams    Fruits and Vegetables 5 servings/day    Sodium 2  grams      Personal Nutrition Goals   Nutrition Goal Eat 15-30gProtein and 30-60gCarbs at each meal.    Personal Goal #2 Read labels and reduce sodium intake to below 2300mg . Ideally 1500mg  per day.    Comments Patient drinking mostly water , sometimes will have unsweetened tea. Has a glass or wine with dinner sometimes. She reports she has been following a mediterranean diet for years now. She tries to limit or avoid processed foods when possible. She says sometimes she skips lunch but then is very hungry come dinner time. Spoke with her about not skipping meal and instead making small meals or snacks that have high nutritional quality. Brainstormed several ideas together focusing on pairing complex carbs with protein or healthy fats. She likes fruits and veggies eating them often.      Intervention Plan   Intervention Prescribe, educate and counsel regarding individualized specific dietary modifications aiming towards targeted core components such as weight, hypertension, lipid management, diabetes, heart failure and other comorbidities.;Nutrition handout(s) given to patient.    Expected Outcomes Short Term Goal: Understand basic principles of dietary content, such as calories, fat, sodium, cholesterol and nutrients.;Short Term Goal: A plan has been developed with personal nutrition goals set during dietitian appointment.;Long Term Goal: Adherence to prescribed nutrition plan.          Nutrition Assessments:  MEDIFICTS Score Key: >=70 Need to make dietary changes  40-70 Heart Healthy Diet <= 40 Therapeutic Level Cholesterol Diet   Picture Your Plate Scores: <59 Unhealthy dietary pattern with much room for improvement. 41-50 Dietary pattern unlikely to meet recommendations for good health and room for improvement. 51-60 More healthful dietary pattern, with some room for improvement.  >60 Healthy dietary pattern, although there may be some specific behaviors that could be improved.    Nutrition Goals Re-Evaluation:  Nutrition Goals Re-Evaluation     Row Name 05/19/24 1614             Goals   Comment Patient was informed on why it is important to maintain a balanced diet when dealing with Respiratory issues. Explained that it takes a lot of energy to breath and when they are short of breath often they will need to have a good diet to help keep up with the calories they are expending for breathing.       Expected Outcome Short: Choose and plan snacks accordingly to patients caloric intake to improve breathing. Long: Maintain a diet independently that meets their caloric intake to aid in daily shortness of breath.          Nutrition Goals Discharge (Final Nutrition Goals Re-Evaluation):  Nutrition Goals Re-Evaluation - 05/19/24 1614       Goals   Comment Patient was informed on why it is important to maintain a balanced diet when dealing with Respiratory issues. Explained that it takes a lot of energy to breath and when they are short of breath often they will need to have a good diet to help keep up with the calories they are expending for breathing.    Expected Outcome Short: Choose and plan snacks accordingly to patients caloric intake to  improve breathing. Long: Maintain a diet independently that meets their caloric intake to aid in daily shortness of breath.          Psychosocial: Target Goals: Acknowledge presence or absence of significant depression and/or stress, maximize coping skills, provide positive support system. Participant is able to verbalize types and ability to use techniques and skills needed for reducing stress and depression.   Education: Stress, Anxiety, and Depression - Group verbal and visual presentation to define topics covered.  Reviews how body is impacted by stress, anxiety, and depression.  Also discusses healthy ways to reduce stress and to treat/manage anxiety and depression.  Written material given at graduation. Flowsheet Row  Pulmonary Rehab from 01/02/2018 in [facility] Cardiac and Pulmonary Rehab  Date 01/02/18  Educator [facility]  Instruction Review Code 1- [employer] Understanding    Education: Sleep Hygiene -Provides group verbal and written instruction about how sleep can affect your health.  Define sleep hygiene, discuss sleep cycles and impact of sleep habits. Review good sleep hygiene tips.    Initial Review & Psychosocial Screening:  Initial Psych Review & Screening - 02/21/24 1606       Initial Review   Current issues with None Identified      Family Dynamics   Good Support System? Yes   daughter , son in law.     Barriers   Psychosocial barriers to participate in program There are no identifiable barriers or psychosocial needs.      Screening Interventions   Interventions To provide support and resources with identified psychosocial needs;Provide feedback about the scores to participant;Encouraged to exercise    Expected Outcomes Short Term goal: Utilizing psychosocial counselor, staff and physician to assist with identification of specific Stressors or current issues interfering with healing process. Setting desired goal for each stressor or current issue identified.;Long Term Goal: Stressors or current issues are controlled or eliminated.;Short Term goal: Identification and review with participant of any Quality of Life or Depression concerns found by scoring the questionnaire.;Long Term goal: The participant improves quality of Life and PHQ9 Scores as seen by post scores and/or verbalization of changes          Quality of Life Scores:  Scores of 19 and below usually indicate a poorer quality of life in these areas.  A difference of  2-3 points is a clinically meaningful difference.  A difference of 2-3 points in the total score of the Quality of Life Index has been associated with significant improvement in overall quality of life, self-image, physical symptoms, and general health in studies assessing  change in quality of life.  PHQ-9: Review Flowsheet  More data may exist      05/19/2024 02/27/2024 12/25/2018 12/26/2017 09/17/2017  Depression screen PHQ 2/9  Decreased Interest 0 2 0 0 0  Down, Depressed, Hopeless 0 0 0 0 0  PHQ - 2 Score 0 2 0 0 0  Altered sleeping 1 1 - 0 0  Tired, decreased energy 1 2 - 1 1  Change in appetite 0 0 - 0 0  Feeling bad or failure about yourself  0 0 - 0 0  Trouble concentrating 0 0 - 0 1  Moving slowly or fidgety/restless 0 0 - 0 0  Suicidal thoughts 0 0 - 0 0  PHQ-9 Score 2 5 - 1 2  Difficult doing work/chores Somewhat difficult Somewhat difficult - Not difficult at all Not difficult at all   Interpretation of Total Score  Total Score Depression Severity:  1-4 = Minimal depression, 5-9 = Mild depression, 10-14 = Moderate depression, 15-19 = Moderately severe depression, 20-27 = Severe depression   Psychosocial Evaluation and Intervention:  Psychosocial Evaluation - 02/21/24 1632       Psychosocial Evaluation & Interventions   Interventions Encouraged to exercise with the program and follow exercise prescription    Comments THere are no barriers to attending the program.   [facility] lives alone with a son that travels [place] there on the weekends. He and her daughterand son in law are her support.  She has completed Cardiac and Pulmnary rehab programs in the past and is looking forward to starting and building back her strength and stamina.  Sh ehas had several prcedures/surgeries since her last admission in the program and has not returned to her ususal exercise regimen.  No depression, stress or other concerns are noted.  She is ready to start.    Expected Outcomes STG  attend all scheduled sessions, gradual progression with exercie, attend education sessions.  LTG conrinue her exwercise progression after discharge.    Continue Psychosocial Services  Follow up required by staff          Psychosocial Re-Evaluation:  Psychosocial Re-Evaluation      Row Name 05/19/24 1615             Psychosocial Re-Evaluation   Comments Reviewed patient health questionnaire (PHQ-9) with patient for follow up. Previously, patients score indicated signs/symptoms of depression.  Reviewed to see if patient is improving symptom wise while in program.  Score improved and patient states that it is because she has been able to exercise and do more.       Expected Outcomes Short: Continue to attend [name] regularly for regular exercise and social engagement. Long: Continue to improve symptoms and manage a positive mental state.       Interventions Encouraged to attend Pulmonary Rehabilitation for the exercise       Continue Psychosocial Services  Follow up required by staff          Psychosocial Discharge (Final Psychosocial Re-Evaluation):  Psychosocial Re-Evaluation - 05/19/24 1615       Psychosocial Re-Evaluation   Comments Reviewed patient health questionnaire (PHQ-9) with patient for follow up. Previously, patients score indicated signs/symptoms of depression.  Reviewed to see if patient is improving symptom wise while in program.  Score improved and patient states that it is because she has been able to exercise and do more.    Expected Outcomes Short: Continue to attend [name] regularly for regular exercise and social engagement. Long: Continue to improve symptoms and manage a positive mental state.    Interventions Encouraged to attend Pulmonary Rehabilitation for the exercise    Continue Psychosocial Services  Follow up required by staff          Education: Education Goals: Education classes will be provided on a weekly basis, covering required topics. Participant will state understanding/return demonstration of topics presented.  Learning Barriers/Preferences:  Learning Barriers/Preferences - 02/21/24 1612       Learning Barriers/Preferences   Learning Barriers Hearing   has hearing aids   Learning Preferences None           General Pulmonary Education Topics:  Infection Prevention: - Provides verbal and written material to individual with discussion of infection control including proper hand washing and proper equipment cleaning during exercise session. Flowsheet Row Pulmonary Rehab from 05/21/2024 in [facility] Cardiac and Pulmonary Rehab  Date 02/27/24  Educator MB  Instruction  Review Code 1- Verbalizes Understanding    Falls Prevention: - Provides verbal and written material to individual with discussion of falls prevention and safety. Flowsheet Row Pulmonary Rehab from 05/21/2024 in [facility] Cardiac and Pulmonary Rehab  Date 02/27/24  Educator MB  Instruction Review Code 1- Verbalizes Understanding    Chronic Lung Disease Review: - Group verbal instruction with posters, models, PowerPoint presentations and videos,  to review new updates, new respiratory medications, new advancements in procedures and treatments. Providing information on websites and 800 numbers for continued self-education. Includes information about supplement oxygen , available portable oxygen  systems, continuous and intermittent flow rates, oxygen  safety, concentrators, and Medicare reimbursement for oxygen . Explanation of Pulmonary Drugs, including class, frequency, complications, importance of spacers, rinsing mouth after steroid MDI's, and proper cleaning methods for nebulizers. Review of basic lung anatomy and physiology related to function, structure, and complications of lung disease. Review of risk factors. Discussion about methods for diagnosing sleep apnea and types of masks and machines for OSA. Includes a review of the use of types of environmental controls: home humidity, furnaces, filters, dust mite/pet prevention, HEPA vacuums. Discussion about weather changes, air quality and the benefits of nasal washing. Instruction on Warning signs, infection symptoms, calling MD promptly, preventive modes, and value of vaccinations. Review of  effective airway clearance, coughing and/or vibration techniques. Emphasizing that all should Create an Action Plan. Written material given at graduation. Flowsheet Row Pulmonary Rehab from 05/21/2024 in [facility] Cardiac and Pulmonary Rehab  Date 05/21/24  Educator [facility]  Instruction Review Code 1- Verbalizes Understanding    AED/CPR: - Group verbal and written instruction with the use of models to demonstrate the basic use of the AED with the basic ABC's of resuscitation. Flowsheet Row Pulmonary Rehab from 01/02/2018 in [facility] Cardiac and Pulmonary Rehab  Date 11/02/17  Educator [facility]  Instruction Review Code 1- Verbalizes Understanding     Anatomy and Cardiac Procedures: - Group verbal and visual presentation and models provide information about basic cardiac anatomy and function. Reviews the testing methods done to diagnose heart disease and the outcomes of the test results. Describes the treatment choices: Medical Management, Angioplasty, or Coronary Bypass Surgery for treating various heart conditions including Myocardial Infarction, Angina, Valve Disease, and Cardiac Arrhythmias.  Written material given at graduation. Flowsheet Row Pulmonary Rehab from 01/02/2018 in [facility] Cardiac and Pulmonary Rehab  Date 12/12/17  Educator [facility]  Instruction Review Code 1- Verbalizes Understanding    Medication Safety: - Group verbal and visual instruction to review commonly prescribed medications for heart and lung disease. Reviews the medication, class of the drug, and side effects. Includes the steps to properly store meds and maintain the prescription regimen.  Written material given at graduation. Flowsheet Row Pulmonary Rehab from 01/02/2018 in [facility] Cardiac and Pulmonary Rehab  Date 12/28/17  Educator [facility]  Instruction Review Code 1- Verbalizes Understanding    Other: -Provides group and verbal instruction on various topics (see comments) Flowsheet Row Pulmonary Rehab from 01/02/2018 in [facility] Cardiac and  Pulmonary Rehab  Date 12/05/17  Educator [facility]  Instruction Review Code 1- Verbalizes Understanding  [SLEEP]    Knowledge Questionnaire Score:    Core Components/Risk Factors/Patient Goals at Admission:  Personal Goals and Risk Factors at Admission - 02/27/24 1559       Core Components/Risk Factors/Patient Goals on Admission    Weight Management Yes;Weight Loss;Weight Maintenance    Intervention Weight Management: Develop a combined nutrition and exercise program designed to reach desired caloric intake, while maintaining appropriate  intake of nutrient and fiber, sodium and fats, and appropriate energy expenditure required for the weight goal.;Weight Management: Provide education and appropriate resources to help participant work on and attain dietary goals.    Admit Weight 155 lb 8 oz (70.5 kg)    Goal Weight: Short Term 154 lb (69.9 kg)    Goal Weight: Long Term 150 lb (68 kg)    Expected Outcomes Short Term: Continue to assess and modify interventions until short term weight is achieved;Long Term: Adherence to nutrition and physical activity/exercise program aimed toward attainment of established weight goal;Weight Maintenance: Understanding of the daily nutrition guidelines, which includes 25-35% calories from fat, 7% or less cal from saturated fats, less than 200mg  cholesterol, less than 1.5gm of sodium, & 5 or more servings of fruits and vegetables daily;Weight Loss: Understanding of general recommendations for a balanced deficit meal plan, which promotes 1-2 lb weight loss per week and includes a negative energy balance of [phone] kcal/d;Understanding recommendations for meals to include 15-35% energy as protein, 25-35% energy from fat, 35-60% energy from carbohydrates, less than 200mg  of dietary cholesterol, 20-35 gm of total fiber daily;Understanding of distribution of calorie intake throughout the day with the consumption of 4-5 meals/snacks    Improve shortness of breath with ADL's Yes     Intervention Provide education, individualized exercise plan and daily activity instruction to help decrease symptoms of SOB with activities of daily living.    Expected Outcomes Short Term: Improve cardiorespiratory fitness to achieve a reduction of symptoms when performing ADLs;Long Term: Be able to perform more ADLs without symptoms or delay the onset of symptoms    Increase knowledge of respiratory medications and ability to use respiratory devices properly  Yes    Intervention Provide education and demonstration as needed of appropriate use of medications, inhalers, and oxygen  therapy.    Expected Outcomes Short Term: Achieves understanding of medications use. Understands that oxygen  is a medication prescribed by physician. Demonstrates appropriate use of inhaler and oxygen  therapy.;Long Term: Maintain appropriate use of medications, inhalers, and oxygen  therapy.    Hypertension Yes    Intervention Provide education on lifestyle modifcations including regular physical activity/exercise, weight management, moderate sodium restriction and increased consumption of fresh fruit, vegetables, and low fat dairy, alcohol  moderation, and smoking cessation.;Monitor prescription use compliance.    Expected Outcomes Short Term: Continued assessment and intervention until BP is < 140/72mm HG in hypertensive participants. < 130/37mm HG in hypertensive participants with diabetes, heart failure or chronic kidney disease.;Long Term: Maintenance of blood pressure at goal levels.    Lipids Yes    Intervention Provide education and support for participant on nutrition & aerobic/resistive exercise along with prescribed medications to achieve LDL 70mg , HDL >40mg .    Expected Outcomes Short Term: Participant states understanding of desired cholesterol values and is compliant with medications prescribed. Participant is following exercise prescription and nutrition guidelines.;Long Term: Cholesterol controlled with  medications as prescribed, with individualized exercise RX and with personalized nutrition plan. Value goals: LDL < 70mg , HDL > 40 mg.          Education:Diabetes - Individual verbal and written instruction to review signs/symptoms of diabetes, desired ranges of glucose level fasting, after meals and with exercise. Acknowledge that pre and post exercise glucose checks will be done for 3 sessions at entry of program.   Know Your Numbers and Heart Failure: - Group verbal and visual instruction to discuss disease risk factors for cardiac and pulmonary disease and treatment options.  Reviews  associated critical values for Overweight/Obesity, Hypertension, Cholesterol, and Diabetes.  Discusses basics of heart failure: signs/symptoms and treatments.  Introduces Heart Failure Zone chart for action plan for heart failure.  Written material given at graduation.   Core Components/Risk Factors/Patient Goals Review:   Goals and Risk Factor Review     Row Name 05/19/24 1613             Core Components/Risk Factors/Patient Goals Review   Personal Goals Review Improve shortness of breath with ADL's       Review Spoke to patient about their shortness of breath and what they can do to improve. Patient has been informed of breathing techniques when starting the program. Patient is informed to tell staff if they have had any med changes and that certain meds they are taking or not taking can be causing shortness of breath.       Expected Outcomes Short: Attend [name] regularly to improve shortness of breath with ADL's. Long: maintain independence with ADL's          Core Components/Risk Factors/Patient Goals at Discharge (Final Review):   Goals and Risk Factor Review - 05/19/24 1613       Core Components/Risk Factors/Patient Goals Review   Personal Goals Review Improve shortness of breath with ADL's    Review Spoke to patient about their shortness of breath and what they can do to improve.  Patient has been informed of breathing techniques when starting the program. Patient is informed to tell staff if they have had any med changes and that certain meds they are taking or not taking can be causing shortness of breath.    Expected Outcomes Short: Attend [name] regularly to improve shortness of breath with ADL's. Long: maintain independence with ADL's          ITP Comments:  ITP Comments     Row Name 02/21/24 1631 02/27/24 1539 03/03/24 1600 03/19/24 1305 04/16/24 0938   ITP Comments Virtual orientation call completed today. shehas an appointment on Date: 02/27/2024  for EP eval and gym Orientation.  Documentation of diagnosis can be found in [facility] 10/09/2023 . Completed and gym orientation for respiratory care services. Initial ITP created and sent for review to Dr. Faud Aleskerov, Medical Director. First full day of exercise!  Patient was oriented to gym and equipment including functions, settings, policies, and procedures.  Patient's individual exercise prescription and treatment plan were reviewed.  All starting workloads were established based on the results of the 6 minute walk test done at initial orientation visit.  The plan for exercise progression was also introduced and progression will be customized based on patient's performance and goals. 30 Day review completed. Medical Director ITP review done, changes made as directed, and signed approval by Medical Director.    new to program 30 Day review completed. Medical Director ITP review done, changes made as directed, and signed approval by Medical Director.    Row Name 05/14/24 0817 06/11/24 0802         ITP Comments 30 Day review completed. Medical Director ITP review done, changes made as directed, and signed approval by Medical Director. 30 Day review completed. Medical Director ITP review done, changes made as directed, and signed approval by Medical Director.         Comments: 30 day review

## 2024-06-12 ENCOUNTER — Encounter

## 2024-06-13 ENCOUNTER — Encounter: Attending: Pulmonary Disease

## 2024-06-13 DIAGNOSIS — I272 Pulmonary hypertension, unspecified: Secondary | ICD-10-CM | POA: Diagnosis not present

## 2024-06-13 NOTE — Progress Notes (Signed)
 Daily Session Note  Patient Details  Name: [name] MRN: [id number] Date of Birth: 1946/07/09 Referring Provider:   [name] from 02/27/2024 in [facility] Cardiac and Pulmonary Rehab  Referring Provider Annella Cough, MD    Encounter Date: 06/13/2024  Check In:  Session Check In - 06/13/24 1108       Check-In   Supervising physician immediately available to respond to emergencies See telemetry face sheet for immediately available ER MD    Location [facility]-Cardiac & Pulmonary Rehab    Staff Present Josette Shallow RN,BC,MSN;Noah Tickle, BS, Exercise Physiologist;Maxon Conetta BS, Exercise Physiologist;Joseph Rolinda RCP,RRT,BSRT    Virtual Visit No    Medication changes reported     No    Fall or balance concerns reported    No    Warm-up and Cool-down Performed on first and last piece of equipment    Resistance Training Performed Yes    PAD/SET Patient? No      VAD patient   Has back up controller? No      Pain Assessment   Currently in Pain? No/denies    Multiple Pain Sites No             Social History   Tobacco Use  Smoking Status Former   Current packs/day: 0.00   Average packs/day: 1 pack/day for 40.0 years (40.0 ttl pk-yrs)   Types: Cigarettes   Start date: 09/15/1967   Quit date: 09/15/2007   Years since quitting: 16.7  Smokeless Tobacco Never  Tobacco Comments   Former smoker Quit 2008    Goals Met:  Independence with exercise equipment Exercise tolerated well No report of concerns or symptoms today Strength training completed today  Goals Unmet:  Not Applicable  Comments: Pt able to follow exercise prescription today without complaint.  Will continue to monitor for progression.    Dr. Oneil Pinal is Medical Director for [facility] Cardiac Rehabilitation.  Dr. Fuad Aleskerov is Medical Director for [facility] Pulmonary Rehabilitation.

## 2024-06-16 ENCOUNTER — Encounter: Admitting: *Deleted

## 2024-06-16 DIAGNOSIS — I272 Pulmonary hypertension, unspecified: Secondary | ICD-10-CM | POA: Diagnosis not present

## 2024-06-16 NOTE — Progress Notes (Signed)
 Daily Session Note  Patient Details  Name: [name] MRN: [id number] Date of Birth: 05-18-1946 Referring Provider:   [name] from 02/27/2024 in [facility] Cardiac and Pulmonary Rehab  Referring Provider Annella Cough, MD    Encounter Date: 06/16/2024  Check In:  Session Check In - 06/16/24 1620       Check-In   Supervising physician immediately available to respond to emergencies See telemetry face sheet for immediately available ER MD    Location [facility]-Cardiac & Pulmonary Rehab    Staff Present Rollene Paterson, MS, Exercise Physiologist;Laureen Delores, BS, RRT, CPFT;Nimco Bivens Tressa RN,BSN;Joseph Rolinda RCP,RRT,BSRT    Virtual Visit No    Medication changes reported     No    Fall or balance concerns reported    No    Warm-up and Cool-down Performed on first and last piece of equipment    Resistance Training Performed Yes    VAD Patient? No    PAD/SET Patient? No      Pain Assessment   Currently in Pain? No/denies             Social History   Tobacco Use  Smoking Status Former   Current packs/day: 0.00   Average packs/day: 1 pack/day for 40.0 years (40.0 ttl pk-yrs)   Types: Cigarettes   Start date: 09/15/1967   Quit date: 09/15/2007   Years since quitting: 16.7  Smokeless Tobacco Never  Tobacco Comments   Former smoker Quit 2008    Goals Met:  Independence with exercise equipment Exercise tolerated well No report of concerns or symptoms today Strength training completed today  Goals Unmet:  Not Applicable  Comments: Pt able to follow exercise prescription today without complaint.  Will continue to monitor for progression.    Dr. Oneil Pinal is Medical Director for [facility] Cardiac Rehabilitation.  Dr. Fuad Aleskerov is Medical Director for [facility] Pulmonary Rehabilitation.

## 2024-06-18 ENCOUNTER — Encounter: Admitting: *Deleted

## 2024-06-18 DIAGNOSIS — I272 Pulmonary hypertension, unspecified: Secondary | ICD-10-CM | POA: Diagnosis not present

## 2024-06-18 NOTE — Progress Notes (Signed)
 Daily Session Note  Patient Details  Name: [name] MRN: [id number] Date of Birth: 1946-10-12 Referring Provider:   [name] [facility] Re[facility] from 02/27/2024 in [facility] Cardiac and Pulmonary Rehab  Referring Provider Annella Cough, MD    Encounter Date: 06/18/2024  Check In:  Session Check In - 06/18/24 1548       Check-In   Supervising physician immediately available to respond to emergencies See telemetry face sheet for immediately available ER MD    Location [facility]-Cardiac & Pulmonary Rehab    Staff Present Burnard Davenport RN,BSN,MPA;Kyna Blahnik Tressa RN,BSN;Kelly Dyane BS, ACSM CEP, Exercise Physiologist;Joseph Rolinda RCP,RRT,BSRT    Virtual Visit No    Medication changes reported     No    Fall or balance concerns reported    No    Warm-up and Cool-down Performed on first and last piece of equipment    Resistance Training Performed Yes    VAD Patient? No    PAD/SET Patient? No      Pain Assessment   Currently in Pain? No/denies             Social History   Tobacco Use  Smoking Status Former   Current packs/day: 0.00   Average packs/day: 1 pack/day for 40.0 years (40.0 ttl pk-yrs)   Types: Cigarettes   Start date: 09/15/1967   Quit date: 09/15/2007   Years since quitting: 16.7  Smokeless Tobacco Never  Tobacco Comments   Former smoker Quit 2008    Goals Met:  Independence with exercise equipment Exercise tolerated well No report of concerns or symptoms today Strength training completed today  Goals Unmet:  Not Applicable  Comments: Pt able to follow exercise prescription today without complaint.  Will continue to monitor for progression.    Dr. Oneil Pinal is Medical Director for [facility] Cardiac Rehabilitation.  Dr. Fuad Aleskerov is Medical Director for [facility] Pulmonary Rehabilitation.

## 2024-06-19 ENCOUNTER — Encounter

## 2024-06-20 ENCOUNTER — Ambulatory Visit (HOSPITAL_BASED_OUTPATIENT_CLINIC_OR_DEPARTMENT_OTHER)
Admission: RE | Admit: 2024-06-20 | Discharge: 2024-06-20 | Disposition: A | Discharge status: Home / Self Care | Source: Ambulatory Visit | Attending: Cardiovascular Disease | Admitting: Cardiovascular Disease

## 2024-06-20 ENCOUNTER — Ambulatory Visit (HOSPITAL_COMMUNITY)
Admission: RE | Admit: 2024-06-20 | Discharge: 2024-06-20 | Disposition: A | Discharge status: Home / Self Care | Source: Ambulatory Visit | Attending: Vascular Surgery | Admitting: Vascular Surgery

## 2024-06-20 ENCOUNTER — Ambulatory Visit (HOSPITAL_BASED_OUTPATIENT_CLINIC_OR_DEPARTMENT_OTHER)
Admission: RE | Admit: 2024-06-20 | Discharge: 2024-06-20 | Disposition: A | Discharge status: Home / Self Care | Source: Ambulatory Visit | Attending: Vascular Surgery | Admitting: Vascular Surgery

## 2024-06-20 ENCOUNTER — Encounter

## 2024-06-20 DIAGNOSIS — I739 Peripheral vascular disease, unspecified: Secondary | ICD-10-CM | POA: Insufficient documentation

## 2024-06-20 DIAGNOSIS — Z95820 Peripheral vascular angioplasty status with implants and grafts: Secondary | ICD-10-CM | POA: Diagnosis not present

## 2024-06-20 LAB — VAS US ABI WITH/WO TBI
Left ABI: 1
Right ABI: 1.03

## 2024-06-23 ENCOUNTER — Encounter: Admitting: *Deleted

## 2024-06-23 DIAGNOSIS — I272 Pulmonary hypertension, unspecified: Secondary | ICD-10-CM | POA: Diagnosis not present

## 2024-06-23 NOTE — Progress Notes (Signed)
 Daily Session Note  Patient Details  Name: [name] MRN: [id number] Date of Birth: [birth year] Referring Provider:   [name] [facility] from 02/27/2024 in [facility] Cardiac and Pulmonary Rehab  Referring Provider Annella Cough, MD    Encounter Date: 06/23/2024  Check In:  Session Check In - 06/23/24 1546       Check-In   Supervising physician immediately available to respond to emergencies See telemetry face sheet for immediately available ER MD    Location [facility]-Cardiac & Pulmonary Rehab    Staff Present Hoy Rodney RN,BSN;Joseph [facility] RCP,RRT,BSRT;Margaret Best, MS, Exercise Physiologist;Maxon Conetta BS, Exercise Physiologist    Virtual Visit No    Medication changes reported     No    Fall or balance concerns reported    No    Tobacco Cessation No Change    Warm-up and Cool-down Performed on first and last piece of equipment    Resistance Training Performed Yes    VAD Patient? No    PAD/SET Patient? No      Pain Assessment   Currently in Pain? No/denies             Social History   Tobacco Use  Smoking Status Former   Current packs/day: 0.00   Average packs/day: 1 pack/day for 40.0 years (40.0 ttl pk-yrs)   Types: Cigarettes   Start date: 09/15/1967   Quit date: 09/15/2007   Years since quitting: 16.7  Smokeless Tobacco Never  Tobacco Comments   Former smoker Quit 2008    Goals Met:  Proper associated with RPD/PD & O2 Sat Independence with exercise equipment Using PLB without cueing & demonstrates good technique Exercise tolerated well No report of concerns or symptoms today Strength training completed today  Goals Unmet:  Not Applicable  Comments: Pt able to follow exercise prescription today without complaint.  Will continue to monitor for progression.    Dr. Oneil Pinal is Medical Director for [facility] Cardiac Rehabilitation.  Dr. Fuad Aleskerov is Medical Director for [facility] Pulmonary Rehabilitation.

## 2024-06-24 ENCOUNTER — Ambulatory Visit: Payer: Self-pay | Admitting: Cardiovascular Disease

## 2024-06-24 DIAGNOSIS — I739 Peripheral vascular disease, unspecified: Secondary | ICD-10-CM

## 2024-06-25 ENCOUNTER — Encounter

## 2024-06-25 DIAGNOSIS — I272 Pulmonary hypertension, unspecified: Secondary | ICD-10-CM | POA: Diagnosis not present

## 2024-06-25 NOTE — Progress Notes (Signed)
 Daily Session Note  Patient Details  Name: [name] MRN: [id number] Date of Birth: [birth year] Referring Provider:   [name]hab from 02/27/2024 in [facility] Cardiac and Pulmonary Rehab  Referring Provider Annella Cough, MD    Encounter Date: 06/25/2024  Check In:  Session Check In - 06/25/24 1527       Check-In   Supervising physician immediately available to respond to emergencies See telemetry face sheet for immediately available ER MD    Location [facility]-Cardiac & Pulmonary Rehab    Staff Present Burnard Davenport RN,BSN,MPA;Joseph [facility] RCP,RRT,BSRT;Dannon Perlow Dyane BS, ACSM CEP, Exercise Physiologist;Jason Elnor RDN,LDN;Meredith Tressa RN,BSN    Virtual Visit No    Medication changes reported     No    Fall or balance concerns reported    No    Tobacco Cessation No Change    Warm-up and Cool-down Performed on first and last piece of equipment    Resistance Training Performed Yes    VAD Patient? No    PAD/SET Patient? No      VAD patient   Has back up controller? No      Pain Assessment   Currently in Pain? No/denies             Social History   Tobacco Use  Smoking Status Former   Current packs/day: 0.00   Average packs/day: 1 pack/day for 40.0 years (40.0 ttl pk-yrs)   Types: Cigarettes   Start date: 09/15/1967   Quit date: 09/15/2007   Years since quitting: 16.7  Smokeless Tobacco Never  Tobacco Comments   Former smoker Quit 2008    Goals Met:  Proper associated with RPD/PD & O2 Sat Independence with exercise equipment Using PLB without cueing & demonstrates good technique Exercise tolerated well Personal goals reviewed No report of concerns or symptoms today Strength training completed today  Goals Unmet:  Not Applicable  Comments: Pt able to follow exercise prescription today without complaint.  Will continue to monitor for progression.    Dr. Oneil Pinal is Medical Director for [facility] Cardiac Rehabilitation.  Dr. Fuad  Aleskerov is Medical Director for [facility] Pulmonary Rehabilitation.

## 2024-06-26 ENCOUNTER — Ambulatory Visit

## 2024-06-27 ENCOUNTER — Encounter

## 2024-06-27 DIAGNOSIS — I272 Pulmonary hypertension, unspecified: Secondary | ICD-10-CM

## 2024-06-27 NOTE — Progress Notes (Signed)
 Daily Session Note  Patient Details  Name: [name] MRN: [id number] Date of Birth: [birth year] Referring Provider:   [name] [facility] Rehab from 02/27/2024 in [facility] Cardiac and Pulmonary Rehab  Referring Provider Annella Cough, MD    Encounter Date: 06/27/2024  Check In:  Session Check In - 06/27/24 1059       Check-In   Supervising physician immediately available to respond to emergencies See telemetry face sheet for immediately available ER MD    Location [facility]-Cardiac & Pulmonary Rehab    Staff Present Burnard Davenport RN,BSN,MPA;Joseph Hood RCP,RRT,BSRT;Maxon Conetta BS, Exercise Physiologist;Noah Tickle, BS, Exercise Physiologist    Virtual Visit No    Medication changes reported     No    Fall or balance concerns reported    No    Tobacco Cessation No Change    Warm-up and Cool-down Performed on first and last piece of equipment    Resistance Training Performed Yes    VAD Patient? No    PAD/SET Patient? No      VAD patient   Has back up controller? No      Pain Assessment   Currently in Pain? No/denies             Social History   Tobacco Use  Smoking Status Former   Current packs/day: 0.00   Average packs/day: 1 pack/day for 40.0 years (40.0 ttl pk-yrs)   Types: Cigarettes   Start date: 09/15/1967   Quit date: 09/15/2007   Years since quitting: 16.7  Smokeless Tobacco Never  Tobacco Comments   Former smoker Quit 2008    Goals Met:  Proper associated with RPD/PD & O2 Sat Independence with exercise equipment Using PLB without cueing & demonstrates good technique Exercise tolerated well No report of concerns or symptoms today Strength training completed today  Goals Unmet:  Not Applicable  Comments: Pt able to follow exercise prescription today without complaint.  Will continue to monitor for progression.    Dr. Oneil Pinal is Medical Director for [facility] Cardiac Rehabilitation.  Dr. Fuad Aleskerov is Medical Director for  [facility] Pulmonary Rehabilitation.

## 2024-06-30 ENCOUNTER — Encounter: Admitting: Emergency Medicine

## 2024-06-30 DIAGNOSIS — I272 Pulmonary hypertension, unspecified: Secondary | ICD-10-CM | POA: Diagnosis not present

## 2024-06-30 DIAGNOSIS — Z23 Encounter for immunization: Secondary | ICD-10-CM | POA: Diagnosis not present

## 2024-06-30 NOTE — Progress Notes (Signed)
 Daily Session Note  Patient Details  Name: [name] MRN: [id number] Date of Birth: 11/11/1946 Referring Provider:   [name] [facility] Rehab from 02/27/2024 in [facility] Cardiac and Pulmonary Rehab  Referring Provider Annella Cough, MD    Encounter Date: 06/30/2024  Check In:  Session Check In - 06/30/24 1555       Check-In   Supervising physician immediately available to respond to emergencies See telemetry face sheet for immediately available ER MD    Location [facility]-Cardiac & Pulmonary Rehab    Staff Present Maxon Conetta BS, Exercise Physiologist;Joseph Hood RCP,RRT,BSRT;Lauren Bailley [place] RN,BSN;Susanne Bice, Charity fundraiser, BSN, CCRP    Virtual Visit No    Medication changes reported     No    Fall or balance concerns reported    No    Tobacco Cessation No Change    Warm-up and Cool-down Performed on first and last piece of equipment    Resistance Training Performed Yes    VAD Patient? No    PAD/SET Patient? No      Pain Assessment   Currently in Pain? No/denies             Social History   Tobacco Use  Smoking Status Former   Current packs/day: 0.00   Average packs/day: 1 pack/day for 40.0 years (40.0 ttl pk-yrs)   Types: Cigarettes   Start date: 09/15/1967   Quit date: 09/15/2007   Years since quitting: 16.8  Smokeless Tobacco Never  Tobacco Comments   Former smoker Quit 2008    Goals Met:  Proper associated with RPD/PD & O2 Sat Independence with exercise equipment Using PLB without cueing & demonstrates good technique Exercise tolerated well No report of concerns or symptoms today Strength training completed today  Goals Unmet:  Not Applicable  Comments: Pt able to follow exercise prescription today without complaint.  Will continue to monitor for progression.    Dr. Oneil Pinal is Medical Director for [facility] Cardiac Rehabilitation.  Dr. Fuad Aleskerov is Medical Director for [facility] Pulmonary Rehabilitation.

## 2024-07-01 ENCOUNTER — Telehealth: Payer: Self-pay | Admitting: Pulmonary Disease

## 2024-07-01 ENCOUNTER — Other Ambulatory Visit: Payer: Self-pay | Admitting: Pulmonary Disease

## 2024-07-01 DIAGNOSIS — J439 Emphysema, unspecified: Secondary | ICD-10-CM

## 2024-07-01 NOTE — Telephone Encounter (Signed)
 New order placed.

## 2024-07-01 NOTE — Telephone Encounter (Signed)
 Please advise is change is okay

## 2024-07-01 NOTE — Telephone Encounter (Signed)
 Copied from CRM #[id number]. Topic: Referral - Question >> Jul 01, 2024 12:16 PM [name] wrote: Reason for CRM: Patient is asking if Dr. Annella could transfer her respiratory therapy from [facility] to [facility]. Please call patient back and advise.

## 2024-07-02 ENCOUNTER — Encounter

## 2024-07-02 NOTE — Telephone Encounter (Signed)
 Pt is aware of below message and voiced her understanding. Nothing further needed.

## 2024-07-03 ENCOUNTER — Telehealth (HOSPITAL_COMMUNITY): Payer: Self-pay

## 2024-07-03 ENCOUNTER — Encounter: Payer: Self-pay | Admitting: *Deleted

## 2024-07-03 ENCOUNTER — Encounter (HOSPITAL_COMMUNITY): Payer: Self-pay

## 2024-07-03 DIAGNOSIS — I272 Pulmonary hypertension, unspecified: Secondary | ICD-10-CM

## 2024-07-03 NOTE — Telephone Encounter (Signed)
 Called to confirm appt. Pt confirmed appt. Instructed pt on proper footwear. Gave directions along with department number.

## 2024-07-03 NOTE — Progress Notes (Signed)
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 why pulse checking is important during independent exercise;Long Term: Able to check pulse independently and accurately  Understanding of Exercise Prescription Yes       Intervention Provide education, explanation, and written materials on patient's individual exercise prescription       Expected Outcomes Long Term: Able to explain home exercise prescription to exercise independently;Short Term: Able to explain program exercise prescription          Exercise Goals Re-Evaluation :  Exercise Goals Re-Evaluation     Row Name 03/03/24 1601 03/13/24 1556 03/27/24 1526 04/11/24 1044 04/24/24 1313     Exercise Goal Re-Evaluation   Exercise Goals Review Increase Physical Activity;Able to understand and use rate of perceived exertion (RPE) scale;Knowledge and understanding of Target Heart Rate Range (THRR);Understanding of Exercise Prescription;Increase Strength and Stamina;Able to check pulse independently;Able to understand and use Dyspnea scale Increase Physical  Activity;Increase Strength and Stamina;Understanding of Exercise Prescription Increase Physical Activity;Increase Strength and Stamina;Understanding of Exercise Prescription Increase Physical Activity;Increase Strength and Stamina;Understanding of Exercise Prescription Increase Physical Activity;Increase Strength and Stamina;Understanding of Exercise Prescription   Comments Reviewed RPE and dyspnea scale, THR and program prescription with pt today.  Pt voiced understanding and was given a copy of goals to take home. [name] is off to a good start in the program. She did well during her first three sessions on the treadmill at a speed of 2.3 mph with a 1% incline. She also did well at level 3 on the T4 nustep and level 1 on both the T5 nustep and biostep. We will continue to monitor her progress in the program. [name] is doing well in rehab. She maintained her workload on the treadmill with a speed of 2.3 mph and incline of 1%. She also maintained level 3 on the T4 nustep. We will continue to monitor her progress in the program. [name] has not attended rehab since 4/30. We will attempt to reach out to determine her progress in the program. [name] has not attended the program since 4/30. We have reached out and she states that she plans to return 6/23.   Expected Outcomes Short: Use RPE daily to regulate intensity.  Long: Follow program prescription in THR. Short: Continue to follow current exercise prescription. Long: Continue exercise to improve strength and stamina. Short: Progressively increase treadmill and T4 nustep workloads. Long: Continue exercise to improve strength and stamina. Short: return to rehab. Long: Continue exercise to improve strength and stamina. Short: return to rehab. Long: Continue exercise to improve strength and stamina.    Row Name 05/08/24 1603 05/19/24 1608 05/22/24 1549 06/04/24 1605 06/19/24 1749     Exercise Goal Re-Evaluation   Exercise Goals Review Increase Physical  Activity;Increase Strength and Stamina;Understanding of Exercise Prescription Increase Physical Activity;Able to understand and use rate of perceived exertion (RPE) scale;Knowledge and understanding of Target Heart Rate Range (THRR);Understanding of Exercise Prescription;Increase Strength and Stamina;Able to understand and use Dyspnea scale;Able to check pulse independently Increase Physical Activity;Understanding of Exercise Prescription;Increase Strength and Stamina Increase Physical Activity;Understanding of Exercise Prescription;Increase Strength and Stamina Increase Physical Activity;Understanding of Exercise Prescription;Increase Strength and Stamina   Comments [name] has not attended the program since 4/30. She previously stated that she planned to return to the program on 6/23, but has not returned to the program. We will contact her to determine a new return date. We will continue to monitor her progress when she returns to the program. Reviewed home exercise with pt today from 3:30 pm to 3:42 pm.  Pt plans to walk for exercise.  Reviewed THR, pulse, RPE, sign and symptoms, pulse oximetery and  when to call 911 or MD.  Also discussed weather considerations and indoor options.  Pt voiced understanding. [name] attended one session in this 2 week review. She increased her workload on the treadmill to a speed of 2.6 mph and incline of 0.5%. She maintained level 3 on the T4 nustep. We will continue to monitor her progress in the program. [name] is doing well in rehab. She has been able to maintain her current treadmill workload at a speed of 2. and 0.5% incline. She was also able to maintain a level 3 on the T4 nustep. We will continue to monitor her progress in the program. [name] is doing well in rehab. She increased to level 4 on the T4 nustep. She added the T6 nustep at level 4. She also maintained a speed of 2.6 mph on the treadmill, but dropped the incline down to none. We will continue to monitor  her progress in the program.   Expected Outcomes Short: Return to rehab when appropriate. Long: Continue exercise to improve strength and stamina. Short: add 1-2 days of walking at home on off days of rehab. Long: maintain independent exercise routine upon graduation from rehab. Short: Attend rehab more consistently. Long: Continue exercise to improve strength and stamina. Short: Continue to follow exercise prescription. Long: Continue exercise to improve strength and stamina. Short: Progressively increase treadmill workload. Long: Continue exercise to improve strength and stamina.    Row Name 06/25/24 1542             Exercise Goal Re-Evaluation   Exercise Goals Review Increase Physical Activity;Increase Strength and Stamina;Understanding of Exercise Prescription       Comments [name] states that she has not currently added home exercise but still has the information she was given from our program. She is trying to build back up her strength by attending class regularly and is thinking about eventually adding some walking on off days of rehab when she feels ready. She has access to walking outside and a TM.       Expected Outcomes Short: add 1-2 days a week of exercise at home once she feels ready. continue to attend rehab regularly to build up strength. Long: maintain independent exercise routine upon graduation from rehab.          Discharge Exercise Prescription (Final Exercise Prescription Changes):  Exercise Prescription Changes - 06/19/24 1700       Response to Exercise   Blood Pressure (Admit) 102/60    Blood Pressure (Exit) 106/56    Heart Rate (Admit) 74 bpm    Heart Rate (Exercise) 108 bpm    Heart Rate (Exit) 78 bpm    Oxygen  Saturation (Admit) 97 %    Oxygen  Saturation (Exercise) 91 %    Oxygen  Saturation (Exit) 96 %    Rating of Perceived Exertion (Exercise) 15    Perceived Dyspnea (Exercise) 3    Symptoms none    Duration Continue with 30 min of aerobic exercise  without signs/symptoms of physical distress.    Intensity THRR unchanged      Progression   Progression Continue to progress workloads to maintain intensity without signs/symptoms of physical distress.    Average METs 2.7      Resistance Training   Training Prescription Yes    Weight 3 lb    Reps 10-15      Interval Training   Interval Training No      Treadmill   MPH 2.6    Grade 0  Minutes 15    METs 2.99      NuStep   Level 4    Minutes 15    METs 2.7      T5 Nustep   Level 4   T6   Minutes 15    METs 2.6      Home Exercise Plan   Plans to continue exercise at Home (comment)   walking   Frequency Add 2 additional days to program exercise sessions.    Initial Home Exercises Provided 05/19/24      Oxygen    Maintain Oxygen  Saturation 88% or higher          Nutrition:  Target Goals: Understanding of nutrition guidelines, daily intake of sodium 1500mg , cholesterol 200mg , calories 30% from fat and 7% or less from saturated fats, daily to have 5 or more servings of fruits and vegetables.  Education: Nutrition 1 -Group instruction provided by verbal, written material, interactive activities, discussions, models, and posters to present general guidelines for heart healthy nutrition including macronutrients, label reading, and promoting whole foods over processed counterparts. Education serves as Pensions consultant of discussion of heart healthy eating for all. Written material provided at class time.     Education: Nutrition 2 -Group instruction provided by verbal, written material, interactive activities, discussions, models, and posters to present general guidelines for heart healthy nutrition including sodium, cholesterol, and saturated fat. Providing guidance of habit forming to improve blood pressure, cholesterol, and body weight. Written material provided at class time.     Biometrics:  Pre Biometrics - 02/27/24 1547       Pre Biometrics   Height 5' 3.58  (1.615 m)    Weight 155 lb 8 oz (70.5 kg)    Waist Circumference 37.5 inches    Hip Circumference 42 inches    Waist to Hip Ratio 0.89 %    BMI (Calculated) 27.04    Single Leg Stand 5.3 seconds           Nutrition Therapy Plan and Nutrition Goals:  Nutrition Therapy & Goals - 02/27/24 1616       Nutrition Therapy   Diet Mediterranean    Protein (specify units) 70-90g    Fiber 25 grams    Whole Grain Foods 3 servings    Saturated Fats 15 max. grams    Fruits and Vegetables 5 servings/day    Sodium 2 grams      Personal Nutrition Goals   Nutrition Goal Eat 15-30gProtein and 30-60gCarbs at each meal.    Personal Goal #2 Read labels and reduce sodium intake to below 2300mg . Ideally 1500mg  per day.    Comments Patient drinking mostly water , sometimes will have unsweetened tea. Has a glass or wine with dinner sometimes. She reports she has been following a mediterranean diet for years now. She tries to limit or avoid processed foods when possible. She says sometimes she skips lunch but then is very hungry come dinner time. Spoke with her about not skipping meal and instead making small meals or snacks that have high nutritional quality. Brainstormed several ideas together focusing on pairing complex carbs with protein or healthy fats. She likes fruits and veggies eating them often.      Intervention Plan   Intervention Prescribe, educate and counsel regarding individualized specific dietary modifications aiming towards targeted core components such as weight, hypertension, lipid management, diabetes, heart failure and other comorbidities.;Nutrition handout(s) given to patient.    Expected Outcomes Short Term Goal: Understand basic principles of dietary content, such  as calories, fat, sodium, cholesterol and nutrients.;Short Term Goal: A plan has been developed with personal nutrition goals set during dietitian appointment.;Long Term Goal: Adherence to prescribed nutrition plan.           Nutrition Assessments:  MEDIFICTS Score Key: >=70 Need to make dietary changes  40-70 Heart Healthy Diet <= 40 Therapeutic Level Cholesterol Diet   Picture Your Plate Scores: <59 Unhealthy dietary pattern with much room for improvement. 41-50 Dietary pattern unlikely to meet recommendations for good health and room for improvement. 51-60 More healthful dietary pattern, with some room for improvement.  >60 Healthy dietary pattern, although there may be some specific behaviors that could be improved.   Nutrition Goals Re-Evaluation:  Nutrition Goals Re-Evaluation     Row Name 05/19/24 1614 06/30/24 1603           Goals   Comment Patient was informed on why it is important to maintain a balanced diet when dealing with Respiratory issues. Explained that it takes a lot of energy to breath and when they are short of breath often they will need to have a good diet to help keep up with the calories they are expending for breathing. Spoke with Heron about meeting protein needs, keeping saturated fat controlled. She is reading labels and likes the way she has been eating for ~17years.      Expected Outcome Short: Choose and plan snacks accordingly to patients caloric intake to improve breathing. Long: Maintain a diet independently that meets their caloric intake to aid in daily shortness of breath. STG: continue to eat healthy and meet protien needs. LTG: Maintain a diet independently that meets their caloric intake to aid in daily shortness of breath.         Nutrition Goals Discharge (Final Nutrition Goals Re-Evaluation):  Nutrition Goals Re-Evaluation - 06/30/24 1603       Goals   Comment Spoke with Heron about meeting protein needs, keeping saturated fat controlled. She is reading labels and likes the way she has been eating for ~17years.    Expected Outcome STG: continue to eat healthy and meet protien needs. LTG: Maintain a diet independently that meets their caloric intake to  aid in daily shortness of breath.          Psychosocial: Target Goals: Acknowledge presence or absence of significant depression and/or stress, maximize coping skills, provide positive support system. Participant is able to verbalize types and ability to use techniques and skills needed for reducing stress and depression.   Education: Stress, Anxiety, and Depression - Group verbal and visual presentation to define topics covered.  Reviews how body is impacted by stress, anxiety, and depression.  Also discusses healthy ways to reduce stress and to treat/manage anxiety and depression.  Written material provided at class time. Flowsheet Row Pulmonary Rehab from 01/02/2018 in [facility] Cardiac and Pul[facility] Rehab  Date 01/02/18  Educator [facility]  Instruction Review Code 1- [employer] Understanding    Education: Sleep Hygiene -Provides group verbal and written instruction about how sleep can affect your health.  Define sleep hygiene, discuss sleep cycles and impact of sleep habits. Review good sleep hygiene tips.    Initial Review & Psychosocial Screening:  Initial Psych Review & Screening - 02/21/24 1606       Initial Review   Current issues with None Identified      Family Dynamics   Good Support System? Yes   daughter , son in law.     Barriers   Psychosocial  barriers to participate in program There are no identifiable barriers or psychosocial needs.      Screening Interventions   Interventions To provide support and resources with identified psychosocial needs;Provide feedback about the scores to participant;Encouraged to exercise    Expected Outcomes Short Term goal: Utilizing psychosocial counselor, staff and physician to assist with identification of specific Stressors or current issues interfering with healing process. Setting desired goal for each stressor or current issue identified.;Long Term Goal: Stressors or current issues are controlled or eliminated.;Short Term goal:  Identification and review with participant of any Quality of Life or Depression concerns found by scoring the questionnaire.;Long Term goal: The participant improves quality of Life and PHQ9 Scores as seen by post scores and/or verbalization of changes          Quality of Life Scores:  Scores of 19 and below usually indicate a poorer quality of life in these areas.  A difference of  2-3 points is a clinically meaningful difference.  A difference of 2-3 points in the total score of the Quality of Life Index has been associated with significant improvement in overall quality of life, self-image, physical symptoms, and general health in studies assessing change in quality of life.  PHQ-9: Review Flowsheet  More data may exist      05/19/2024 02/27/2024 12/25/2018 12/26/2017 09/17/2017  Depression screen PHQ 2/9  Decreased Interest 0 2 0 0 0  Down, Depressed, Hopeless 0 0 0 0 0  PHQ - 2 Score 0 2 0 0 0  Altered sleeping 1 1 - 0 0  Tired, decreased energy 1 2 - 1 1  Change in appetite 0 0 - 0 0  Feeling bad or failure about yourself  0 0 - 0 0  Trouble concentrating 0 0 - 0 1  Moving slowly or fidgety/restless 0 0 - 0 0  Suicidal thoughts 0 0 - 0 0  PHQ-9 Score 2 5 - 1 2  Difficult doing work/chores Somewhat difficult Somewhat difficult - Not difficult at all Not difficult at all   Interpretation of Total Score  Total Score Depression Severity:  1-4 = Minimal depression, 5-9 = Mild depression, 10-14 = Moderate depression, 15-19 = Moderately severe depression, 20-27 = Severe depression   Psychosocial Evaluation and Intervention:  Psychosocial Evaluation - 02/21/24 1632       Psychosocial Evaluation & Interventions   Interventions Encouraged to exercise with the program and follow exercise prescription    Comments THere are no barriers to attending the program.   [facility] lives alone with a son that travels [place] there on the weekends. He and her daughterand son in law are her support.  She  has completed Cardiac and Pulmnary rehab programs in the past and is looking forward to starting and building back her strength and stamina.  Sh ehas had several prcedures/surgeries since her last admission in the program and has not returned to her ususal exercise regimen.  No depression, stress or other concerns are noted.  She is ready to start.    Expected Outcomes STG  attend all scheduled sessions, gradual progression with exercie, attend education sessions.  LTG conrinue her exwercise progression after discharge.    Continue Psychosocial Services  Follow up required by staff          Psychosocial Re-Evaluation:  Psychosocial Re-Evaluation     Row Name 05/19/24 1615             Psychosocial Re-Evaluation   Comments Reviewed patient health  questionnaire (PHQ-9) with patient for follow up. Previously, patients score indicated signs/symptoms of depression.  Reviewed to see if patient is improving symptom wise while in program.  Score improved and patient states that it is because she has been able to exercise and do more.       Expected Outcomes Short: Continue to attend [name] regularly for regular exercise and social engagement. Long: Continue to improve symptoms and manage a positive mental state.       Interventions Encouraged to attend Pulmonary Rehabilitation for the exercise       Continue Psychosocial Services  Follow up required by staff          Psychosocial Discharge (Final Psychosocial Re-Evaluation):  Psychosocial Re-Evaluation - 05/19/24 1615       Psychosocial Re-Evaluation   Comments Reviewed patient health questionnaire (PHQ-9) with patient for follow up. Previously, patients score indicated signs/symptoms of depression.  Reviewed to see if patient is improving symptom wise while in program.  Score improved and patient states that it is because she has been able to exercise and do more.    Expected Outcomes Short: Continue to attend [name] regularly for  regular exercise and social engagement. Long: Continue to improve symptoms and manage a positive mental state.    Interventions Encouraged to attend Pulmonary Rehabilitation for the exercise    Continue Psychosocial Services  Follow up required by staff          Education: Education Goals: Education classes will be provided on a weekly basis, covering required topics. Participant will state understanding/return demonstration of topics presented.  Learning Barriers/Preferences:  Learning Barriers/Preferences - 02/21/24 1612       Learning Barriers/Preferences   Learning Barriers Hearing   has hearing aids   Learning Preferences None          General Pulmonary Education Topics:  Infection Prevention: - Provides verbal and written material to individual with discussion of infection control including proper hand washing and proper equipment cleaning during exercise session. Flowsheet Row Pulmonary Rehab from 05/21/2024 in [facility] Cardiac and Pulmonary Rehab  Date 02/27/24  Educator MB  Instruction Review Code 1- Verbalizes Understanding    Falls Prevention: - Provides verbal and written material to individual with discussion of falls prevention and safety. Flowsheet Row Pulmonary Rehab from 05/21/2024 in [facility] Cardiac and Pulmonary Rehab  Date 02/27/24  Educator MB  Instruction Review Code 1- Verbalizes Understanding    Chronic Lung Disease Review: - Group verbal instruction with posters, models, PowerPoint presentations and videos,  to review new updates, new respiratory medications, new advancements in procedures and treatments. Providing information on websites and 800 numbers for continued self-education. Includes information about supplement oxygen , available portable oxygen  systems, continuous and intermittent flow rates, oxygen  safety, concentrators, and Medicare reimbursement for oxygen . Explanation of Pulmonary Drugs, including class, frequency, complications, importance of  spacers, rinsing mouth after steroid MDI's, and proper cleaning methods for nebulizers. Review of basic lung anatomy and physiology related to function, structure, and complications of lung disease. Review of risk factors. Discussion about methods for diagnosing sleep apnea and types of masks and machines for OSA. Includes a review of the use of types of environmental controls: home humidity, furnaces, filters, dust mite/pet prevention, HEPA vacuums. Discussion about weather changes, air quality and the benefits of nasal washing. Instruction on Warning signs, infection symptoms, calling MD promptly, preventive modes, and value of vaccinations. Review of effective airway clearance, coughing and/or vibration techniques. Emphasizing that all should Create an Action Plan.  Written material provided at class time. Flowsheet Row Pulmonary Rehab from 05/21/2024 in [facility] Cardiac and Pulmonary Rehab  Date 05/21/24  Educator [facility]  Instruction Review Code 1- Verbalizes Understanding    AED/CPR: - Group verbal and written instruction with the use of models to demonstrate the basic use of the AED with the basic ABC's of resuscitation. Flowsheet Row Pulmonary Rehab from 01/02/2018 in [facility] Cardiac and Pulmonary Rehab  Date 11/02/17  Educator [facility]  Instruction Review Code 1- Verbalizes Understanding     Tests and Procedures:  - Group verbal and visual presentation and models provide information about basic cardiac anatomy and function. Reviews the testing methods done to diagnose heart disease and the outcomes of the test results. Describes the treatment choices: Medical Management, Angioplasty, or Coronary Bypass Surgery for treating various heart conditions including Myocardial Infarction, Angina, Valve Disease, and Cardiac Arrhythmias.  Written material provided at class time. Flowsheet Row Pulmonary Rehab from 01/02/2018 in [facility] Cardiac and Pulmonary Rehab  Date 12/12/17  Educator [facility]  Instruction Review Code 1-  Verbalizes Understanding    Medication Safety: - Group verbal and visual instruction to review commonly prescribed medications for heart and lung disease. Reviews the medication, class of the drug, and side effects. Includes the steps to properly store meds and maintain the prescription regimen.  Written material given at graduation. Flowsheet Row Pulmonary Rehab from 01/02/2018 in [facility] Cardiac and Pulmonary Rehab  Date 12/28/17  Educator [facility]  Instruction Review Code 1- Verbalizes Understanding    Other: -Provides group and verbal instruction on various topics (see comments) Flowsheet Row Pulmonary Rehab from 01/02/2018 in [facility] Cardiac and Pulmonary Rehab  Date 12/05/17  Educator [facility]  Instruction Review Code 1- Verbalizes Understanding  [SLEEP]    Knowledge Questionnaire Score:    Core Components/Risk Factors/Patient Goals at Admission:  Personal Goals and Risk Factors at Admission - 02/27/24 1559       Core Components/Risk Factors/Patient Goals on Admission    Weight Management Yes;Weight Loss;Weight Maintenance    Intervention Weight Management: Develop a combined nutrition and exercise program designed to reach desired caloric intake, while maintaining appropriate intake of nutrient and fiber, sodium and fats, and appropriate energy expenditure required for the weight goal.;Weight Management: Provide education and appropriate resources to help participant work on and attain dietary goals.    Admit Weight 155 lb 8 oz (70.5 kg)    Goal Weight: Short Term 154 lb (69.9 kg)    Goal Weight: Long Term 150 lb (68 kg)    Expected Outcomes Short Term: Continue to assess and modify interventions until short term weight is achieved;Long Term: Adherence to nutrition and physical activity/exercise program aimed toward attainment of established weight goal;Weight Maintenance: Understanding of the daily nutrition guidelines, which includes 25-35% calories from fat, 7% or less cal from saturated fats,  less than 200mg  cholesterol, less than 1.5gm of sodium, & 5 or more servings of fruits and vegetables daily;Weight Loss: Understanding of general recommendations for a balanced deficit meal plan, which promotes 1-2 lb weight loss per week and includes a negative energy balance of [phone] kcal/d;Understanding recommendations for meals to include 15-35% energy as protein, 25-35% energy from fat, 35-60% energy from carbohydrates, less than 200mg  of dietary cholesterol, 20-35 gm of total fiber daily;Understanding of distribution of calorie intake throughout the day with the consumption of 4-5 meals/snacks    Improve shortness of breath with ADL's Yes    Intervention Provide education, individualized exercise plan and daily activity instruction to  help decrease symptoms of SOB with activities of daily living.    Expected Outcomes Short Term: Improve cardiorespiratory fitness to achieve a reduction of symptoms when performing ADLs;Long Term: Be able to perform more ADLs without symptoms or delay the onset of symptoms    Increase knowledge of respiratory medications and ability to use respiratory devices properly  Yes    Intervention Provide education and demonstration as needed of appropriate use of medications, inhalers, and oxygen  therapy.    Expected Outcomes Short Term: Achieves understanding of medications use. Understands that oxygen  is a medication prescribed by physician. Demonstrates appropriate use of inhaler and oxygen  therapy.;Long Term: Maintain appropriate use of medications, inhalers, and oxygen  therapy.    Hypertension Yes    Intervention Provide education on lifestyle modifcations including regular physical activity/exercise, weight management, moderate sodium restriction and increased consumption of fresh fruit, vegetables, and low fat dairy, alcohol  moderation, and smoking cessation.;Monitor prescription use compliance.    Expected Outcomes Short Term: Continued assessment and intervention  until BP is < 140/47mm HG in hypertensive participants. < 130/85mm HG in hypertensive participants with diabetes, heart failure or chronic kidney disease.;Long Term: Maintenance of blood pressure at goal levels.    Lipids Yes    Intervention Provide education and support for participant on nutrition & aerobic/resistive exercise along with prescribed medications to achieve LDL 70mg , HDL >40mg .    Expected Outcomes Short Term: Participant states understanding of desired cholesterol values and is compliant with medications prescribed. Participant is following exercise prescription and nutrition guidelines.;Long Term: Cholesterol controlled with medications as prescribed, with individualized exercise RX and with personalized nutrition plan. Value goals: LDL < 70mg , HDL > 40 mg.          Education:Diabetes - Individual verbal and written instruction to review signs/symptoms of diabetes, desired ranges of glucose level fasting, after meals and with exercise. Acknowledge that pre and post exercise glucose checks will be done for 3 sessions at entry of program.   Know Your Numbers and Heart Failure: - Group verbal and visual instruction to discuss disease risk factors for cardiac and pulmonary disease and treatment options.  Reviews associated critical values for Overweight/Obesity, Hypertension, Cholesterol, and Diabetes.  Discusses basics of heart failure: signs/symptoms and treatments.  Introduces Heart Failure Zone chart for action plan for heart failure. Written material provided at class time.   Core Components/Risk Factors/Patient Goals Review:   Goals and Risk Factor Review     Row Name 05/19/24 1613             Core Components/Risk Factors/Patient Goals Review   Personal Goals Review Improve shortness of breath with ADL's       Review Spoke to patient about their shortness of breath and what they can do to improve. Patient has been informed of breathing techniques when starting the  program. Patient is informed to tell staff if they have had any med changes and that certain meds they are taking or not taking can be causing shortness of breath.       Expected Outcomes Short: Attend [name] regularly to improve shortness of breath with ADL's. Long: maintain independence with ADL's          Core Components/Risk Factors/Patient Goals at Discharge (Final Review):   Goals and Risk Factor Review - 05/19/24 1613       Core Components/Risk Factors/Patient Goals Review   Personal Goals Review Improve shortness of breath with ADL's    Review Spoke to patient about their shortness of breath  and what they can do to improve. Patient has been informed of breathing techniques when starting the program. Patient is informed to tell staff if they have had any med changes and that certain meds they are taking or not taking can be causing shortness of breath.    Expected Outcomes Short: Attend [name] regularly to improve shortness of breath with ADL's. Long: maintain independence with ADL's          ITP Comments:  ITP Comments     Row Name 02/21/24 1631 02/27/24 1539 03/03/24 1600 03/19/24 1305 04/16/24 0938   ITP Comments Virtual orientation call completed today. shehas an appointment on Date: 02/27/2024  for EP eval and gym Orientation.  Documentation of diagnosis can be found in [facility] 10/09/2023 . Completed and gym orientation for respiratory care services. Initial ITP created and sent for review to Dr. Faud Aleskerov, Medical Director. First full day of exercise!  Patient was oriented to gym and equipment including functions, settings, policies, and procedures.  Patient's individual exercise prescription and treatment plan were reviewed.  All starting workloads were established based on the results of the 6 minute walk test done at initial orientation visit.  The plan for exercise progression was also introduced and progression will be customized based on patient's performance and  goals. 30 Day review completed. Medical Director ITP review done, changes made as directed, and signed approval by Medical Director.    new to program 30 Day review completed. Medical Director ITP review done, changes made as directed, and signed approval by Medical Director.    Row Name 05/14/24 0817 06/11/24 0802 07/03/24 1430       ITP Comments 30 Day review completed. Medical Director ITP review done, changes made as directed, and signed approval by Medical Director. 30 Day review completed. Medical Director ITP review done, changes made as directed, and signed approval by Medical Director. Patient requesting to be discharged from the program at this time. She completed 26 of 36 sessions.        Comments: Discharge ITP

## 2024-07-03 NOTE — Telephone Encounter (Signed)
 Pt insurance is active and benefits verified through Medicare a/b Co-pay 0, DED $257/$257 met, out of pocket 0/0 met, co-insurance 20%. no pre-authorization required.   72 visits 24/72 USED

## 2024-07-04 ENCOUNTER — Ambulatory Visit

## 2024-07-04 ENCOUNTER — Encounter (HOSPITAL_COMMUNITY): Payer: Self-pay

## 2024-07-04 ENCOUNTER — Encounter (HOSPITAL_COMMUNITY)
Admission: RE | Admit: 2024-07-04 | Discharge: 2024-07-04 | Disposition: A | Discharge status: Home / Self Care | Source: Ambulatory Visit | Attending: Pulmonary Disease | Admitting: Pulmonary Disease

## 2024-07-04 VITALS — BP 130/66 | Ht 63.58 in | Wt 146.8 lb

## 2024-07-04 DIAGNOSIS — I272 Pulmonary hypertension, unspecified: Secondary | ICD-10-CM | POA: Diagnosis not present

## 2024-07-04 NOTE — Progress Notes (Addendum)
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 exercises routinely during rehab and add in resistance training at home;Long Term: Improve cardiorespiratory fitness, muscular endurance and strength as measured by increased METs and functional capacity ( )      Able to understand and use rate of perceived exertion (RPE) scale Yes Yes      Intervention Provide education and explanation on how to use RPE scale Provide education and explanation on how to use RPE scale      Expected Outcomes Short Term: Able to use RPE daily in rehab to express subjective intensity level;Long Term:  Able to  use RPE to guide intensity level when exercising independently Short Term: Able to use RPE daily in rehab to express subjective intensity level;Long Term:  Able to use RPE to guide intensity level when exercising independently      Able to understand and use Dyspnea scale Yes Yes      Intervention Provide education and  explanation on how to use Dyspnea scale Provide education and explanation on how to use Dyspnea scale      Expected Outcomes Short Term: Able to use Dyspnea scale daily in rehab to express subjective sense of shortness of breath during exertion;Long Term: Able to use Dyspnea scale to guide intensity level when exercising independently Short Term: Able to use Dyspnea scale daily in rehab to express subjective sense of shortness of breath during exertion;Long Term: Able to use Dyspnea scale to guide intensity level when exercising independently      Knowledge and understanding of Target Heart Rate Range (THRR) Yes Yes      Intervention Provide education and explanation of THRR including how the numbers were predicted and where they are located for reference Provide education and explanation of THRR including how the numbers were predicted and where they are located for reference      Expected Outcomes Short Term: Able to state/look up THRR;Long Term: Able to use THRR to govern intensity when exercising independently;Short Term: Able to use daily as guideline for intensity in rehab Short Term: Able to state/look up THRR;Long Term: Able to use THRR to govern intensity when exercising independently;Short Term: Able to use daily as guideline for intensity in rehab      Able to check pulse independently Yes --      Intervention Provide education and demonstration on how to check pulse in carotid and radial arteries.;Review the importance of being able to check your own pulse for safety during independent exercise --      Expected Outcomes Short Term: Able to explain why pulse checking is important during independent exercise;Long Term: Able to check pulse independently and accurately --      Understanding of Exercise Prescription Yes Yes      Intervention Provide education, explanation, and written materials on patient's individual exercise prescription Provide education, explanation, and written materials on  patient's individual exercise prescription      Expected Outcomes Long Term: Able to explain home exercise prescription to exercise independently;Short Term: Able to explain program exercise prescription Long Term: Able to explain home exercise prescription to exercise independently;Short Term: Able to explain program exercise prescription         Exercise Goals Re-Evaluation :  Exercise Goals Re-Evaluation     Row Name 03/03/24 1601 03/13/24 1556 03/27/24 1526 04/11/24 1044 04/24/24 1313     Exercise Goal Re-Evaluation   Exercise Goals Review Increase Physical Activity;Able to understand and use rate of perceived exertion (RPE) scale;Knowledge and understanding of Target Heart Rate Range (THRR);Understanding of Exercise Prescription;Increase Strength and Stamina;Able to check pulse independently;Able to understand and use Dyspnea scale Increase Physical Activity;Increase Strength and Stamina;Understanding of Exercise Prescription Increase Physical Activity;Increase Strength and Stamina;Understanding of Exercise Prescription Increase Physical Activity;Increase Strength and Stamina;Understanding of Exercise Prescription Increase Physical Activity;Increase Strength and Stamina;Understanding of Exercise Prescription   Comments Reviewed RPE and dyspnea scale, THR and program prescription with pt today.  Pt voiced understanding and was given a copy of goals to take home. [name] is off to a good start in the program. She did well during her first three sessions on  the treadmill at a speed of 2.3 mph with a 1% incline. She also did well at level 3 on the T4 nustep and level 1 on both the T5 nustep and biostep. We will continue to monitor her progress in the program. [name] is doing well in rehab. She maintained her workload on the treadmill with a speed of 2.3 mph and incline of 1%. She also maintained level 3 on the T4 nustep. We will continue to monitor her progress in the program. [name] has not attended  rehab since 4/30. We will attempt to reach out to determine her progress in the program. [name] has not attended the program since 4/30. We have reached out and she states that she plans to return 6/23.   Expected Outcomes Short: Use RPE daily to regulate intensity.  Long: Follow program prescription in THR. Short: Continue to follow current exercise prescription. Long: Continue exercise to improve strength and stamina. Short: Progressively increase treadmill and T4 nustep workloads. Long: Continue exercise to improve strength and stamina. Short: return to rehab. Long: Continue exercise to improve strength and stamina. Short: return to rehab. Long: Continue exercise to improve strength and stamina.    Row Name 05/08/24 1603 05/19/24 1608 05/22/24 1549 06/04/24 1605 06/19/24 1749     Exercise Goal Re-Evaluation   Exercise Goals Review Increase Physical Activity;Increase Strength and Stamina;Understanding of Exercise Prescription Increase Physical Activity;Able to understand and use rate of perceived exertion (RPE) scale;Knowledge and understanding of Target Heart Rate Range (THRR);Understanding of Exercise Prescription;Increase Strength and Stamina;Able to understand and use Dyspnea scale;Able to check pulse independently Increase Physical Activity;Understanding of Exercise Prescription;Increase Strength and Stamina Increase Physical Activity;Understanding of Exercise Prescription;Increase Strength and Stamina Increase Physical Activity;Understanding of Exercise Prescription;Increase Strength and Stamina   Comments [name] has not attended the program since 4/30. She previously stated that she planned to return to the program on 6/23, but has not returned to the program. We will contact her to determine a new return date. We will continue to monitor her progress when she returns to the program. Reviewed home exercise with pt today from 3:30 pm to 3:42 pm.  Pt plans to walk for exercise.  Reviewed THR, pulse,  RPE, sign and symptoms, pulse oximetery and when to call 911 or MD.  Also discussed weather considerations and indoor options.  Pt voiced understanding. [name] attended one session in this 2 week review. She increased her workload on the treadmill to a speed of 2.6 mph and incline of 0.5%. She maintained level 3 on the T4 nustep. We will continue to monitor her progress in the program. [name] is doing well in rehab. She has been able to maintain her current treadmill workload at a speed of 2. and 0.5% incline. She was also able to maintain a level 3 on the T4 nustep. We will continue to monitor her progress in the program. [name] is doing well in rehab. She increased to level 4 on the T4 nustep. She added the T6 nustep at level 4. She also maintained a speed of 2.6 mph on the treadmill, but dropped the incline down to none. We will continue to monitor her progress in the program.   Expected Outcomes Short: Return to rehab when appropriate. Long: Continue exercise to improve strength and stamina. Short: add 1-2 days of walking at home on off days of rehab. Long: maintain independent exercise routine upon graduation from rehab. Short: Attend rehab more consistently. Long: Continue exercise to improve strength and stamina. Short: Continue  to follow exercise prescription. Long: Continue exercise to improve strength and stamina. Short: Progressively increase treadmill workload. Long: Continue exercise to improve strength and stamina.    Row Name 06/25/24 1542 07/04/24 0923           Exercise Goal Re-Evaluation   Exercise Goals Review Increase Physical Activity;Increase Strength and Stamina;Understanding of Exercise Prescription Increase Physical Activity;Increase Strength and Stamina;Understanding of Exercise Prescription;Able to understand and use Dyspnea scale;Knowledge and understanding of Target Heart Rate Range (THRR);Able to understand and use rate of perceived exertion (RPE) scale      Comments  [name] states that she has not currently added home exercise but still has the information she was given from our program. She is trying to build back up her strength by attending class regularly and is thinking about eventually adding some walking on off days of rehab when she feels ready. She has access to walking outside and a TM. [name] is transferring from [facility] to [facility].      Expected Outcomes Short: add 1-2 days a week of exercise at home once she feels ready. continue to attend rehab regularly to build up strength. Long: maintain independent exercise routine upon graduation from rehab. Through exercise at rehab and home, the patient will decrease shortness of breath with daily activities and feel confident in carrying out an exercise regimen at home.         Discharge Exercise Prescription (Final Exercise Prescription Changes):  Exercise Prescription Changes - 06/19/24 1700       Response to Exercise   Blood Pressure (Admit) 102/60    Blood Pressure (Exit) 106/56    Heart Rate (Admit) 74 bpm    Heart Rate (Exercise) 108 bpm    Heart Rate (Exit) 78 bpm    Oxygen  Saturation (Admit) 97 %    Oxygen  Saturation (Exercise) 91 %    Oxygen  Saturation (Exit) 96 %    Rating of Perceived Exertion (Exercise) 15    Perceived Dyspnea (Exercise) 3    Symptoms none    Duration Continue with 30 min of aerobic exercise without signs/symptoms of physical distress.    Intensity THRR unchanged      Progression   Progression Continue to progress workloads to maintain intensity without signs/symptoms of physical distress.    Average METs 2.7      Resistance Training   Training Prescription Yes    Weight 3 lb    Reps 10-15      Interval Training   Interval Training No      Treadmill   MPH 2.6    Grade 0    Minutes 15    METs 2.99      NuStep   Level 4    Minutes 15    METs 2.7      T5 Nustep   Level 4   T6   Minutes 15    METs 2.6      Home Exercise Plan   Plans to continue exercise  at Home (comment)   walking   Frequency Add 2 additional days to program exercise sessions.    Initial Home Exercises Provided 05/19/24      Oxygen    Maintain Oxygen  Saturation 88% or higher          Nutrition:  Target Goals: Understanding of nutrition guidelines, daily intake of sodium 1500mg , cholesterol 200mg , calories 30% from fat and 7% or less from saturated fats, daily to have 5 or more servings of fruits and vegetables.  Education: Nutrition 1 -Group instruction provided by verbal, written material, interactive activities, discussions, models, and posters to present general guidelines for heart healthy nutrition including macronutrients, label reading, and promoting whole foods over processed counterparts. Education serves as Pensions consultant of discussion of heart healthy eating for all. Written material provided at class time.     Education: Nutrition 2 -Group instruction provided by verbal, written material, interactive activities, discussions, models, and posters to present general guidelines for heart healthy nutrition including sodium, cholesterol, and saturated fat. Providing guidance of habit forming to improve blood pressure, cholesterol, and body weight. Written material provided at class time.     Biometrics:  Pre Biometrics - 02/27/24 1547       Pre Biometrics   Height 5' 3.58 (1.615 m)    Weight 70.5 kg    Waist Circumference 37.5 inches    Hip Circumference 42 inches    Waist to Hip Ratio 0.89 %    BMI (Calculated) 27.04    Single Leg Stand 5.3 seconds          Post Biometrics - 07/04/24 0910        Post  Biometrics   Height 5' 3.58 (1.615 m)    BMI (Calculated) 25.53          Nutrition Therapy Plan and Nutrition Goals:  Nutrition Therapy & Goals - 02/27/24 1616       Nutrition Therapy   Diet Mediterranean    Protein (specify units) 70-90g    Fiber 25 grams    Whole Grain Foods 3 servings    Saturated Fats 15 max. grams    Fruits and  Vegetables 5 servings/day    Sodium 2 grams      Personal Nutrition Goals   Nutrition Goal Eat 15-30gProtein and 30-60gCarbs at each meal.    Personal Goal #2 Read labels and reduce sodium intake to below 2300mg . Ideally 1500mg  per day.    Comments Patient drinking mostly water , sometimes will have unsweetened tea. Has a glass or wine with dinner sometimes. She reports she has been following a mediterranean diet for years now. She tries to limit or avoid processed foods when possible. She says sometimes she skips lunch but then is very hungry come dinner time. Spoke with her about not skipping meal and instead making small meals or snacks that have high nutritional quality. Brainstormed several ideas together focusing on pairing complex carbs with protein or healthy fats. She likes fruits and veggies eating them often.      Intervention Plan   Intervention Prescribe, educate and counsel regarding individualized specific dietary modifications aiming towards targeted core components such as weight, hypertension, lipid management, diabetes, heart failure and other comorbidities.;Nutrition handout(s) given to patient.    Expected Outcomes Short Term Goal: Understand basic principles of dietary content, such as calories, fat, sodium, cholesterol and nutrients.;Short Term Goal: A plan has been developed with personal nutrition goals set during dietitian appointment.;Long Term Goal: Adherence to prescribed nutrition plan.          Nutrition Assessments:  MEDIFICTS Score Key: >=70 Need to make dietary changes  40-70 Heart Healthy Diet <= 40 Therapeutic Level Cholesterol Diet   Picture Your Plate Scores: <59 Unhealthy dietary pattern with much room for improvement. 41-50 Dietary pattern unlikely to meet recommendations for good health and room for improvement. 51-60 More healthful dietary pattern, with some room for improvement.  >60 Healthy dietary pattern, although there may be some specific  behaviors that could be improved.  Nutrition Goals Re-Evaluation:  Nutrition Goals Re-Evaluation     Row Name 05/19/24 1614 06/30/24 1603           Goals   Comment Patient was informed on why it is important to maintain a balanced diet when dealing with Respiratory issues. Explained that it takes a lot of energy to breath and when they are short of breath often they will need to have a good diet to help keep up with the calories they are expending for breathing. Spoke with [name] about meeting protein needs, keeping saturated fat controlled. She is reading labels and likes the way she has been eating for ~17years.      Expected Outcome Short: Choose and plan snacks accordingly to patients caloric intake to improve breathing. Long: Maintain a diet independently that meets their caloric intake to aid in daily shortness of breath. STG: continue to eat healthy and meet protien needs. LTG: Maintain a diet independently that meets their caloric intake to aid in daily shortness of breath.         Nutrition Goals Discharge (Final Nutrition Goals Re-Evaluation):  Nutrition Goals Re-Evaluation - 06/30/24 1603       Goals   Comment Spoke with [name] about meeting protein needs, keeping saturated fat controlled. She is reading labels and likes the way she has been eating for ~17years.    Expected Outcome STG: continue to eat healthy and meet protien needs. LTG: Maintain a diet independently that meets their caloric intake to aid in daily shortness of breath.          Psychosocial: Target Goals: Acknowledge presence or absence of significant depression and/or stress, maximize coping skills, provide positive support system. Participant is able to verbalize types and ability to use techniques and skills needed for reducing stress and depression.   Education: Stress, Anxiety, and Depression - Group verbal and visual presentation to define topics covered.  Reviews how body is impacted by stress,  anxiety, and depression.  Also discusses healthy ways to reduce stress and to treat/manage anxiety and depression.  Written material provided at class time. Flowsheet Row Pulmonary Rehab from 01/02/2018 in [facility] Card[facility] Pul[facility] Rehab  Date 01/02/18  Educator [facility]  Instruction Review Code 1- [employer] Understanding    Education: Sleep Hygiene -Provides group verbal and written instruction about how sleep can affect your health.  Define sleep hygiene, discuss sleep cycles and impact of sleep habits. Review good sleep hygiene tips.    Initial Review & Psychosocial Screening:  Initial Psych Review & Screening - 07/04/24 0915       Initial Review   Current issues with None Identified      Family Dynamics   Good Support System? Yes    Comments 2 children and grandchildren      Barriers   Psychosocial barriers to participate in program There are no identifiable barriers or psychosocial needs.      Screening Interventions   Interventions Encouraged to exercise          Quality of Life Scores:  Scores of 19 and below usually indicate a poorer quality of life in these areas.  A difference of  2-3 points is a clinically meaningful difference.  A difference of 2-3 points in the total score of the Quality of Life Index has been associated with significant improvement in overall quality of life, self-image, physical symptoms, and general health in studies assessing change in quality of life.  PHQ-9: Review Flowsheet  More data exists  07/04/2024 05/19/2024 02/27/2024 12/25/2018 12/26/2017  Depression screen PHQ 2/9  Decreased Interest 0 0 2 0 0  Down, Depressed, Hopeless 0 0 0 0 0  PHQ - 2 Score 0 0 2 0 0  Altered sleeping 1 1 1  - 0  Tired, decreased energy 0 1 2 - 1  Change in appetite 0 0 0 - 0  Feeling bad or failure about yourself  0 0 0 - 0  Trouble concentrating 0 0 0 - 0  Moving slowly or fidgety/restless 0 0 0 - 0  Suicidal thoughts 0 0 0 - 0  PHQ-9 Score 1 2 5  - 1   Difficult doing work/chores Not difficult at all Somewhat difficult Somewhat difficult - Not difficult at all   Interpretation of Total Score  Total Score Depression Severity:  1-4 = Minimal depression, 5-9 = Mild depression, 10-14 = Moderate depression, 15-19 = Moderately severe depression, 20-27 = Severe depression   Psychosocial Evaluation and Intervention:  Psychosocial Evaluation - 07/04/24 0916       Psychosocial Evaluation & Interventions   Interventions Encouraged to exercise with the program and follow exercise prescription    Comments THere are no barriers to attending the program.    Expected Outcomes For [name] to participate in rehab free of psychosocial concerns.    Continue Psychosocial Services  Follow up required by staff          Psychosocial Re-Evaluation:  Psychosocial Re-Evaluation     Row Name 05/19/24 1615             Psychosocial Re-Evaluation   Comments Reviewed patient health questionnaire (PHQ-9) with patient for follow up. Previously, patients score indicated signs/symptoms of depression.  Reviewed to see if patient is improving symptom wise while in program.  Score improved and patient states that it is because she has been able to exercise and do more.       Expected Outcomes Short: Continue to attend [name] regularly for regular exercise and social engagement. Long: Continue to improve symptoms and manage a positive mental state.       Interventions Encouraged to attend Pulmonary Rehabilitation for the exercise       Continue Psychosocial Services  Follow up required by staff          Psychosocial Discharge (Final Psychosocial Re-Evaluation):  Psychosocial Re-Evaluation - 05/19/24 1615       Psychosocial Re-Evaluation   Comments Reviewed patient health questionnaire (PHQ-9) with patient for follow up. Previously, patients score indicated signs/symptoms of depression.  Reviewed to see if patient is improving symptom wise while in program.   Score improved and patient states that it is because she has been able to exercise and do more.    Expected Outcomes Short: Continue to attend [name] regularly for regular exercise and social engagement. Long: Continue to improve symptoms and manage a positive mental state.    Interventions Encouraged to attend Pulmonary Rehabilitation for the exercise    Continue Psychosocial Services  Follow up required by staff          Education: Education Goals: Education classes will be provided on a weekly basis, covering required topics. Participant will state understanding/return demonstration of topics presented.  Learning Barriers/Preferences:  Learning Barriers/Preferences - 07/04/24 0917       Learning Barriers/Preferences   Learning Barriers Hearing   has hearing aids   Learning Preferences None          General Pulmonary Education Topics:  Infection Prevention: - Provides verbal  and written material to individual with discussion of infection control including proper hand washing and proper equipment cleaning during exercise session. Flowsheet Row Pulmonary Rehab from 05/21/2024 in [facility] Cardiac and Pulmonary Rehab  Date 02/27/24  Educator MB  Instruction Review Code 1- Verbalizes Understanding    Falls Prevention: - Provides verbal and written material to individual with discussion of falls prevention and safety. Flowsheet Row Pulmonary Rehab from 05/21/2024 in [facility] Cardiac and Pulmonary Rehab  Date 02/27/24  Educator MB  Instruction Review Code 1- Verbalizes Understanding    Chronic Lung Disease Review: - Group verbal instruction with posters, models, PowerPoint presentations and videos,  to review new updates, new respiratory medications, new advancements in procedures and treatments. Providing information on websites and 800 numbers for continued self-education. Includes information about supplement oxygen , available portable oxygen  systems, continuous and intermittent  flow rates, oxygen  safety, concentrators, and Medicare reimbursement for oxygen . Explanation of Pulmonary Drugs, including class, frequency, complications, importance of spacers, rinsing mouth after steroid MDI's, and proper cleaning methods for nebulizers. Review of basic lung anatomy and physiology related to function, structure, and complications of lung disease. Review of risk factors. Discussion about methods for diagnosing sleep apnea and types of masks and machines for OSA. Includes a review of the use of types of environmental controls: home humidity, furnaces, filters, dust mite/pet prevention, HEPA vacuums. Discussion about weather changes, air quality and the benefits of nasal washing. Instruction on Warning signs, infection symptoms, calling MD promptly, preventive modes, and value of vaccinations. Review of effective airway clearance, coughing and/or vibration techniques. Emphasizing that all should Create an Action Plan. Written material provided at class time. Flowsheet Row Pulmonary Rehab from 05/21/2024 in [facility] Cardiac and Pulmonary Rehab  Date 05/21/24  Educator [facility]  Instruction Review Code 1- Verbalizes Understanding    AED/CPR: - Group verbal and written instruction with the use of models to demonstrate the basic use of the AED with the basic ABC's of resuscitation. Flowsheet Row Pulmonary Rehab from 01/02/2018 in [facility] Cardiac and Pulmonary Rehab  Date 11/02/17  Educator [facility]  Instruction Review Code 1- Verbalizes Understanding     Tests and Procedures:  - Group verbal and visual presentation and models provide information about basic cardiac anatomy and function. Reviews the testing methods done to diagnose heart disease and the outcomes of the test results. Describes the treatment choices: Medical Management, Angioplasty, or Coronary Bypass Surgery for treating various heart conditions including Myocardial Infarction, Angina, Valve Disease, and Cardiac Arrhythmias.  Written material  provided at class time. Flowsheet Row Pulmonary Rehab from 01/02/2018 in [facility] Cardiac and Pulmonary Rehab  Date 12/12/17  Educator [facility]  Instruction Review Code 1- Verbalizes Understanding    Medication Safety: - Group verbal and visual instruction to review commonly prescribed medications for heart and lung disease. Reviews the medication, class of the drug, and side effects. Includes the steps to properly store meds and maintain the prescription regimen.  Written material given at graduation. Flowsheet Row Pulmonary Rehab from 01/02/2018 in [facility] Cardiac and Pulmonary Rehab  Date 12/28/17  Educator [facility]  Instruction Review Code 1- Verbalizes Understanding    Other: -Provides group and verbal instruction on various topics (see comments) Flowsheet Row Pulmonary Rehab from 01/02/2018 in [facility] Cardiac and Pulmonary Rehab  Date 12/05/17  Educator [facility]  Instruction Review Code 1- Verbalizes Understanding  [SLEEP]    Knowledge Questionnaire Score:  Knowledge Questionnaire Score - 07/04/24 9047       Knowledge Questionnaire Score   Pre  Score 18/18           Core Components/Risk Factors/Patient Goals at Admission:  Personal Goals and Risk Factors at Admission - 07/04/24 0917       Core Components/Risk Factors/Patient Goals on Admission   Admit Weight --    Goal Weight: Short Term --    Goal Weight: Long Term --    Improve shortness of breath with ADL's Yes    Intervention Provide education, individualized exercise plan and daily activity instruction to help decrease symptoms of SOB with activities of daily living.    Expected Outcomes Short Term: Improve cardiorespiratory fitness to achieve a reduction of symptoms when performing ADLs;Long Term: Be able to perform more ADLs without symptoms or delay the onset of symptoms    Expected Outcomes --          Education:Diabetes - Individual verbal and written instruction to review signs/symptoms of diabetes, desired ranges of glucose  level fasting, after meals and with exercise. Acknowledge that pre and post exercise glucose checks will be done for 3 sessions at entry of program.   Know Your Numbers and Heart Failure: - Group verbal and visual instruction to discuss disease risk factors for cardiac and pulmonary disease and treatment options.  Reviews associated critical values for Overweight/Obesity, Hypertension, Cholesterol, and Diabetes.  Discusses basics of heart failure: signs/symptoms and treatments.  Introduces Heart Failure Zone chart for action plan for heart failure. Written material provided at class time.   Core Components/Risk Factors/Patient Goals Review:   Goals and Risk Factor Review     Row Name 05/19/24 1613             Core Components/Risk Factors/Patient Goals Review   Personal Goals Review Improve shortness of breath with ADL's       Review Spoke to patient about their shortness of breath and what they can do to improve. Patient has been informed of breathing techniques when starting the program. Patient is informed to tell staff if they have had any med changes and that certain meds they are taking or not taking can be causing shortness of breath.       Expected Outcomes Short: Attend [name] regularly to improve shortness of breath with ADL's. Long: maintain independence with ADL's          Core Components/Risk Factors/Patient Goals at Discharge (Final Review):   Goals and Risk Factor Review - 05/19/24 1613       Core Components/Risk Factors/Patient Goals Review   Personal Goals Review Improve shortness of breath with ADL's    Review Spoke to patient about their shortness of breath and what they can do to improve. Patient has been informed of breathing techniques when starting the program. Patient is informed to tell staff if they have had any med changes and that certain meds they are taking or not taking can be causing shortness of breath.    Expected Outcomes Short: Attend [name]  regularly to improve shortness of breath with ADL's. Long: maintain independence with ADL's          ITP Comments: Dr. Slater Staff is Medical Director for Pulmonary Rehab at [facility].

## 2024-07-04 NOTE — Progress Notes (Signed)
 Pulmonary Rehab Physical Assessment Note  Patient Details  Name: LAKEISA HENINGER MRN: [id number] Date of Birth: [birth year] Referring Provider:   [name] Pulmonary Rehab Walk Test from 07/04/2024 in [facility], V[facility]  Referring Provider [name]    Encounter Date: 07/04/2024  Well appearing, A&Ox4, NAD Eyes/Ears: + glasses, + sensorineural hearing loss   Lungs: + Clear with no wheezes, rales, rhonchi, denies chronic cough, + dyspnea on exertion Heart: Regular rate rhythm, no murmurs, no rubs, no clicks Gastrointestinal: abdomin soft, + bowel sounds in all 4 quads, denies recent weight gain or loss, endorses normal BMs Genitourinary: WNL, pt denies s/s Extremities:  +2 pulses, grip strength equal, strong, no edema, no cyanosis, no clubbing Integumentary: pt denies any rashes, open or non healing wounds Psy/Soc: Pt denies any needs or referrals Assistive devices: pt denies

## 2024-07-04 NOTE — Progress Notes (Signed)
 [name] [place] 78 y.o. female Pulmonary Rehab Orientation Note This patient who was referred to Pulmonary Rehab by Dr. Annella with the diagnosis of Pulmonary HTN arrived today in Cardiac and Pulmonary Rehab. She  arrived ambulatory with normal gait. She  does not carry portable oxygen . Per patient, [name] uses oxygen  never. Color good, skin warm and dry. Patient is oriented to time and place. Patient's medical history, psychosocial health, and medications reviewed. Psychosocial assessment reveals patient lives with family. [name] is currently retired. Patient hobbies include watching tv and researching, and reading. Patient reports her stress level is low. Areas of stress/anxiety include health. Patient does not exhibit signs of depression. PHQ2/9 score 0/1. [name] shows good  coping skills with positive outlook on life. Offered emotional support and reassurance. Will continue to monitor. Physical assessment performed by Bridget Levin RN. Please see their orientation physical assessment note. [name] reports she does take medications as prescribed. Patient states she follows a regular  diet. The patient reports no specific efforts to gain or lose weight.. Patient's weight will be monitored closely. Demonstration and practice of PLB using pulse oximeter. [name] able to return demonstration satisfactorily. Safety and hand hygiene in the exercise area reviewed with patient. [name] voices understanding of the information reviewed. Department expectations discussed with patient and achievable goals were set. The patient shows enthusiasm about attending the program and we look forward to working with Bridget. [name] completed a 6 min walk test today and is scheduled to begin exercise on 07/10/24 at 1:15 pm.  [id number] Bridget JINNY Moats, MS, ACSM-CEP

## 2024-07-07 ENCOUNTER — Encounter

## 2024-07-07 DIAGNOSIS — Z23 Encounter for immunization: Secondary | ICD-10-CM | POA: Diagnosis not present

## 2024-07-09 ENCOUNTER — Encounter

## 2024-07-10 ENCOUNTER — Encounter (HOSPITAL_COMMUNITY)
Admission: RE | Admit: 2024-07-10 | Discharge: 2024-07-10 | Disposition: A | Discharge status: Home / Self Care | Source: Ambulatory Visit | Attending: Pulmonary Disease | Admitting: Pulmonary Disease

## 2024-07-10 DIAGNOSIS — I272 Pulmonary hypertension, unspecified: Secondary | ICD-10-CM | POA: Diagnosis not present

## 2024-07-10 NOTE — Progress Notes (Signed)
 Daily Session Note  Patient Details  Name: [name] MRN: [id number] Date of Birth: 28-Aug-1946 Referring Provider:   [name] Pulmonary Rehab Walk Test from 07/04/2024 in [facility] for Heart, Vascular, & Lung Health  Referring Provider [name]    Encounter Date: 07/10/2024  Check In:  Session Check In - 07/10/24 1508       Check-In   Supervising physician immediately available to respond to emergencies [facility] MD immediately available    Physician(s) Jackee Alberts, NP    Staff Present Ronal Levin, RN, Maud Moats, MS, ACSM-CEP, Exercise Physiologist;Rochele Lueck Claudene Idelia Aris BS, ACSM-CEP, Exercise Physiologist    Virtual Visit No    Medication changes reported     No    Fall or balance concerns reported    No    Tobacco Cessation No Change    Warm-up and Cool-down Performed as group-led instruction    Resistance Training Performed Yes    VAD Patient? No    PAD/SET Patient? No      VAD patient   Has back up controller? No      Pain Assessment   Currently in Pain? No/denies    Multiple Pain Sites No          Capillary Blood Glucose: No results found. However, due to the size of the patient record, not all encounters were searched. Please check Results Review for a complete set of results.    Social History   Tobacco Use  Smoking Status Former   Current packs/day: 0.00   Average packs/day: 1 pack/day for 40.0 years (40.0 ttl pk-yrs)   Types: Cigarettes   Start date: 09/15/1967   Quit date: 09/15/2007   Years since quitting: 16.8  Smokeless Tobacco Never  Tobacco Comments   Former smoker Quit 2008    Goals Met:  Proper associated with RPD/PD & O2 Sat Independence with exercise equipment Exercise tolerated well No report of concerns or symptoms today Strength training completed today  Goals Unmet:  Not Applicable  Comments: Service time is from 1302 to 1445.    Dr. Slater Staff is Medical Director for Pulmonary  Rehab at [facility].

## 2024-07-11 ENCOUNTER — Ambulatory Visit

## 2024-07-15 ENCOUNTER — Encounter (HOSPITAL_COMMUNITY)
Admission: RE | Admit: 2024-07-15 | Discharge: 2024-07-15 | Disposition: A | Discharge status: Home / Self Care | Source: Ambulatory Visit | Attending: Pulmonary Disease | Admitting: Pulmonary Disease

## 2024-07-15 VITALS — Wt 147.0 lb

## 2024-07-15 DIAGNOSIS — I272 Pulmonary hypertension, unspecified: Secondary | ICD-10-CM | POA: Diagnosis present

## 2024-07-16 ENCOUNTER — Encounter

## 2024-07-16 NOTE — Progress Notes (Signed)
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 during exertion;Long Term: Able to use Dyspnea scale to guide intensity level when exercising independently      Knowledge and understanding of Target Heart Rate Range (THRR) Yes Yes      Intervention Provide education and explanation of THRR including how the numbers were predicted and where they are located for reference Provide education and explanation of THRR including how the numbers were predicted and where they are located for  reference      Expected Outcomes Short Term: Able to state/look up THRR;Long Term: Able to use THRR to govern intensity when exercising independently;Short Term: Able to use daily as guideline for intensity in rehab Short Term: Able to state/look up THRR;Long Term: Able to use THRR to govern intensity when exercising independently;Short Term: Able to use daily as guideline for intensity in rehab       Able to check pulse independently Yes --      Intervention Provide education and demonstration on how to check pulse in carotid and radial arteries.;Review the importance of being able to check your own pulse for safety during independent exercise --      Expected Outcomes Short Term: Able to explain why pulse checking is important during independent exercise;Long Term: Able to check pulse independently and accurately --      Understanding of Exercise Prescription Yes Yes      Intervention Provide education, explanation, and written materials on patient's individual exercise prescription Provide education, explanation, and written materials on patient's individual exercise prescription      Expected Outcomes Long Term: Able to explain home exercise prescription to exercise independently;Short Term: Able to explain program exercise prescription Long Term: Able to explain home exercise prescription to exercise independently;Short Term: Able to explain program exercise prescription         Exercise Goals Re-Evaluation :  Exercise Goals Re-Evaluation     Row Name 03/03/24 1601 03/13/24 1556 03/27/24 1526 04/11/24 1044 04/24/24 1313     Exercise Goal Re-Evaluation   Exercise Goals Review Increase Physical Activity;Able to understand and use rate of perceived exertion (RPE) scale;Knowledge and understanding of Target Heart Rate Range (THRR);Understanding of Exercise Prescription;Increase Strength and Stamina;Able to check pulse independently;Able to understand and use Dyspnea scale Increase Physical Activity;Increase Strength and Stamina;Understanding of Exercise Prescription Increase Physical Activity;Increase Strength and Stamina;Understanding of Exercise Prescription Increase Physical Activity;Increase Strength and Stamina;Understanding of Exercise Prescription Increase Physical Activity;Increase Strength and Stamina;Understanding of Exercise Prescription   Comments Reviewed RPE and dyspnea scale, THR  and program prescription with pt today.  Pt voiced understanding and was given a copy of goals to take home. Jamar is off to a good start in the program. She did well during her first three sessions on the treadmill at a speed of 2.3 mph with a 1% incline. She also did well at level 3 on the T4 nustep and level 1 on both the T5 nustep and biostep. We will continue to monitor her progress in the program. Monty is doing well in rehab. She maintained her workload on the treadmill with a speed of 2.3 mph and incline of 1%. She also maintained level 3 on the T4 nustep. We will continue to monitor her progress in the program. Jelitza has not attended rehab since 4/30. We will attempt to reach out to determine her progress in the program. Branden has not attended the program since 4/30. We have reached out and she states that she plans to return 6/23.   Expected Outcomes Short: Use RPE daily to regulate intensity.  Long: Follow program prescription in THR. Short: Continue to follow current exercise prescription. Long: Continue exercise to improve strength and stamina. Short: Progressively increase treadmill and T4 nustep workloads. Long: Continue exercise to improve strength and stamina. Short: return to rehab. Long: Continue exercise to improve strength and stamina. Short: return to rehab. Long: Continue exercise to improve strength and stamina.    Row Name 05/08/24 [phone] 05/19/24 1608  05/22/24 1549 06/04/24 1605 06/19/24 1749     Exercise Goal Re-Evaluation   Exercise Goals Review Increase Physical Activity;Increase Strength and Stamina;Understanding of Exercise Prescription Increase Physical Activity;Able to understand and use rate of perceived exertion (RPE) scale;Knowledge and understanding of Target Heart Rate Range (THRR);Understanding of Exercise Prescription;Increase Strength and Stamina;Able to understand and use Dyspnea scale;Able to check pulse independently Increase Physical Activity;Understanding of  Exercise Prescription;Increase Strength and Stamina Increase Physical Activity;Understanding of Exercise Prescription;Increase Strength and Stamina Increase Physical Activity;Understanding of Exercise Prescription;Increase Strength and Stamina   Comments Laiah has not attended the program since 4/30. She previously stated that she planned to return to the program on 6/23, but has not returned to the program. We will contact her to determine a new return date. We will continue to monitor her progress when she returns to the program. Reviewed home exercise with pt today from 3:30 pm to 3:42 pm.  Pt plans to walk for exercise.  Reviewed THR, pulse, RPE, sign and symptoms, pulse oximetery and when to call 911 or MD.  Also discussed weather considerations and indoor options.  Pt voiced understanding. Iliani attended one session in this 2 week review. She increased her workload on the treadmill to a speed of 2.6 mph and incline of 0.5%. She maintained level 3 on the T4 nustep. We will continue to monitor her progress in the program. Jerrell is doing well in rehab. She has been able to maintain her current treadmill workload at a speed of 2. and 0.5% incline. She was also able to maintain a level 3 on the T4 nustep. We will continue to monitor her progress in the program. Felisa is doing well in rehab. She increased to level 4 on the T4 nustep. She added the T6 nustep at level 4. She also maintained a speed of 2.6 mph on the treadmill, but dropped the incline down to none. We will continue to monitor her progress in the program.   Expected Outcomes Short: Return to rehab when appropriate. Long: Continue exercise to improve strength and stamina. Short: add 1-2 days of walking at home on off days of rehab. Long: maintain independent exercise routine upon graduation from rehab. Short: Attend rehab more consistently. Long: Continue exercise to improve strength and stamina. Short: Continue to follow exercise  prescription. Long: Continue exercise to improve strength and stamina. Short: Progressively increase treadmill workload. Long: Continue exercise to improve strength and stamina.    Row Name 06/25/24 1542 07/04/24 0923 07/08/24 0903         Exercise Goal Re-Evaluation   Exercise Goals Review Increase Physical Activity;Increase Strength and Stamina;Understanding of Exercise Prescription Increase Physical Activity;Increase Strength and Stamina;Understanding of Exercise Prescription;Able to understand and use Dyspnea scale;Knowledge and understanding of Target Heart Rate Range (THRR);Able to understand and use rate of perceived exertion (RPE) scale Increase Physical Activity;Increase Strength and Stamina;Understanding of Exercise Prescription;Able to understand and use Dyspnea scale;Knowledge and understanding of Target Heart Rate Range (THRR);Able to understand and use rate of perceived exertion (RPE) scale     Comments Kimberlin states that she has not currently added home exercise but still has the information she was given from our program. She is trying to build back up her strength by attending class regularly and is thinking about eventually adding some walking on off days of rehab when she feels ready. She has access to walking outside and a TM. Brittnee is transferring from [facility] to [facility]. Pt to begin exercise 8/28 here at [facility].  Will progress as tolerated.     Expected Outcomes Short: add 1-2 days a week of exercise at home once she feels ready. continue to attend rehab regularly to build up strength. Long: maintain independent exercise routine upon graduation from rehab. Through exercise at rehab and home, the patient will decrease shortness of breath with daily activities and feel confident in carrying out an exercise regimen at home. Through exercise at rehab and home, the patient will decrease shortness of breath with daily activities and feel confident in carrying out an exercise regimen at home.         Discharge Exercise Prescription (Final Exercise Prescription Changes):  Exercise Prescription Changes - 07/15/24 1500       Response to Exercise   Blood Pressure (Admit) 124/64    Blood Pressure (Exercise) 140/58    Blood Pressure (Exit) 102/54    Heart Rate (Admit) 69 bpm    Heart Rate (Exercise) 87 bpm    Heart Rate (Exit) 76 bpm    Oxygen  Saturation (Admit) 100 %    Oxygen  Saturation (Exercise) 93 %    Oxygen  Saturation (Exit) 96 %    Rating of Perceived Exertion (Exercise) 15    Perceived Dyspnea (Exercise) 3    Duration Continue with 30 min of aerobic exercise without signs/symptoms of physical distress.    Intensity THRR unchanged      Resistance Training   Training Prescription Yes    Weight blue bands    Reps 10-15    Time 10 Minutes      Treadmill   MPH 2.7    Grade 0    Minutes 15    METs 3.07      NuStep   Level 1    Minutes 15    METs 2.6          Nutrition:  Target Goals: Understanding of nutrition guidelines, daily intake of sodium 1500mg , cholesterol 200mg , calories 30% from fat and 7% or less from saturated fats, daily to have 5 or more servings of fruits and vegetables.  Biometrics:  Pre Biometrics - 02/27/24 1547       Pre Biometrics   Height 5' 3.58 (1.615 m)    Weight 70.5 kg    Waist Circumference 37.5 inches    Hip Circumference 42 inches    Waist to Hip Ratio 0.89 %    BMI (Calculated) 27.04    Single Leg Stand 5.3 seconds          Post Biometrics - 07/04/24 0910        Post  Biometrics   Height 5' 3.58 (1.615 m)    BMI (Calculated) 25.53          Nutrition Therapy Plan and Nutrition Goals:  Nutrition Therapy & Goals - 02/27/24 1616       Nutrition Therapy   Diet Mediterranean    Protein (specify units) 70-90g    Fiber 25 grams    Whole Grain Foods 3 servings    Saturated Fats 15 max. grams    Fruits and Vegetables 5 servings/day    Sodium 2 grams      Personal Nutrition Goals   Nutrition Goal Eat  15-30gProtein and 30-60gCarbs at each meal.    Personal Goal #2 Read labels and reduce sodium intake to below 2300mg . Ideally 1500mg  per day.    Comments Patient drinking mostly water , sometimes will have unsweetened tea. Has a glass or wine with dinner sometimes. She reports she has been following  a mediterranean diet for years now. She tries to limit or avoid processed foods when possible. She says sometimes she skips lunch but then is very hungry come dinner time. Spoke with her about not skipping meal and instead making small meals or snacks that have high nutritional quality. Brainstormed several ideas together focusing on pairing complex carbs with protein or healthy fats. She likes fruits and veggies eating them often.      Intervention Plan   Intervention Prescribe, educate and counsel regarding individualized specific dietary modifications aiming towards targeted core components such as weight, hypertension, lipid management, diabetes, heart failure and other comorbidities.;Nutrition handout(s) given to patient.    Expected Outcomes Short Term Goal: Understand basic principles of dietary content, such as calories, fat, sodium, cholesterol and nutrients.;Short Term Goal: A plan has been developed with personal nutrition goals set during dietitian appointment.;Long Term Goal: Adherence to prescribed nutrition plan.          Nutrition Assessments:  MEDIFICTS Score Key: >=70 Need to make dietary changes  40-70 Heart Healthy Diet <= 40 Therapeutic Level Cholesterol Diet   Picture Your Plate Scores: <59 Unhealthy dietary pattern with much room for improvement. 41-50 Dietary pattern unlikely to meet recommendations for good health and room for improvement. 51-60 More healthful dietary pattern, with some room for improvement.  >60 Healthy dietary pattern, although there may be some specific behaviors that could be improved.    Nutrition Goals Re-Evaluation:  Nutrition Goals Re-Evaluation      Row Name 05/19/24 1614 06/30/24 1603           Goals   Comment Patient was informed on why it is important to maintain a balanced diet when dealing with Respiratory issues. Explained that it takes a lot of energy to breath and when they are short of breath often they will need to have a good diet to help keep up with the calories they are expending for breathing. Spoke with Heron about meeting protein needs, keeping saturated fat controlled. She is reading labels and likes the way she has been eating for ~17years.      Expected Outcome Short: Choose and plan snacks accordingly to patients caloric intake to improve breathing. Long: Maintain a diet independently that meets their caloric intake to aid in daily shortness of breath. STG: continue to eat healthy and meet protien needs. LTG: Maintain a diet independently that meets their caloric intake to aid in daily shortness of breath.         Nutrition Goals Discharge (Final Nutrition Goals Re-Evaluation):  Nutrition Goals Re-Evaluation - 06/30/24 1603       Goals   Comment Spoke with Heron about meeting protein needs, keeping saturated fat controlled. She is reading labels and likes the way she has been eating for ~17years.    Expected Outcome STG: continue to eat healthy and meet protien needs. LTG: Maintain a diet independently that meets their caloric intake to aid in daily shortness of breath.          Psychosocial: Target Goals: Acknowledge presence or absence of significant depression and/or stress, maximize coping skills, provide positive support system. Participant is able to verbalize types and ability to use techniques and skills needed for reducing stress and depression.  Initial Review & Psychosocial Screening:  Initial Psych Review & Screening - 07/04/24 0915       Initial Review   Current issues with None Identified      Family Dynamics   Good Support System? Yes  Comments 2 children and grandchildren       Barriers   Psychosocial barriers to participate in program There are no identifiable barriers or psychosocial needs.      Screening Interventions   Interventions Encouraged to exercise          Quality of Life Scores:  Scores of 19 and below usually indicate a poorer quality of life in these areas.  A difference of  2-3 points is a clinically meaningful difference.  A difference of 2-3 points in the total score of the Quality of Life Index has been associated with significant improvement in overall quality of life, self-image, physical symptoms, and general health in studies assessing change in quality of life.  PHQ-9: Review Flowsheet  More data exists      07/04/2024 05/19/2024 02/27/2024 12/25/2018 12/26/2017  Depression screen PHQ 2/9  Decreased Interest 0 0 2 0 0  Down, Depressed, Hopeless 0 0 0 0 0  PHQ - 2 Score 0 0 2 0 0  Altered sleeping 1 1 1  - 0  Tired, decreased energy 0 1 2 - 1  Change in appetite 0 0 0 - 0  Feeling bad or failure about yourself  0 0 0 - 0  Trouble concentrating 0 0 0 - 0  Moving slowly or fidgety/restless 0 0 0 - 0  Suicidal thoughts 0 0 0 - 0  PHQ-9 Score 1 2 5  - 1  Difficult doing work/chores Not difficult at all Somewhat difficult Somewhat difficult - Not difficult at all   Interpretation of Total Score  Total Score Depression Severity:  1-4 = Minimal depression, 5-9 = Mild depression, 10-14 = Moderate depression, 15-19 = Moderately severe depression, 20-27 = Severe depression   Psychosocial Evaluation and Intervention:  Psychosocial Evaluation - 07/04/24 0916       Psychosocial Evaluation & Interventions   Interventions Encouraged to exercise with the program and follow exercise prescription    Comments THere are no barriers to attending the program.    Expected Outcomes For Hiilani to participate in rehab free of psychosocial concerns.    Continue Psychosocial Services  Follow up required by staff          Psychosocial Re-Evaluation:   Psychosocial Re-Evaluation     Row Name 05/19/24 1615 07/09/24 1611 07/11/24 1125 07/11/24 1126       Psychosocial Re-Evaluation   Current issues with -- None Identified -- None Identified    Comments Reviewed patient health questionnaire (PHQ-9) with patient for follow up. Previously, patients score indicated signs/symptoms of depression.  Reviewed to see if patient is improving symptom wise while in program.  Score improved and patient states that it is because she has been able to exercise and do more. Psychosocial monthly re-evaluation is as follows: Leili came in for orientation on 07/04/24. She scored a 0/1 on her PHQ 2-9. She denies any psychosocial barriers or concerns. She states she has good support from her children and grandchildren. She declines any referrals or additional resources. -- Psychosocial monthly re-evaluation is as follows: Toby came in for orientation on 07/04/24. She scored a 0/1 on her PHQ 2-9. She denies any psychosocial barriers or concerns. She states she has good support from her children and grandchildren. She declines any referrals or additional resources.    Expected Outcomes Short: Continue to attend [name] regularly for regular exercise and social engagement. Long: Continue to improve symptoms and manage a positive mental state. For Amaia to participate in rehab free of  psychosocial concerns. -- For Sophonie to participate in rehab free of psychosocial concerns.    Interventions Encouraged to attend Pulmonary Rehabilitation for the exercise Encouraged to attend Pulmonary Rehabilitation for the exercise -- Encouraged to attend Pulmonary Rehabilitation for the exercise    Continue Psychosocial Services  Follow up required by staff No Follow up required -- No Follow up required       Psychosocial Discharge (Final Psychosocial Re-Evaluation):  Psychosocial Re-Evaluation - 07/11/24 1126       Psychosocial Re-Evaluation   Current issues with None Identified     Comments Psychosocial monthly re-evaluation is as follows: Marley came in for orientation on 07/04/24. She scored a 0/1 on her PHQ 2-9. She denies any psychosocial barriers or concerns. She states she has good support from her children and grandchildren. She declines any referrals or additional resources.    Expected Outcomes For Avarie to participate in rehab free of psychosocial concerns.    Interventions Encouraged to attend Pulmonary Rehabilitation for the exercise    Continue Psychosocial Services  No Follow up required          Education: Education Goals: Education classes will be provided on a weekly basis, covering required topics. Participant will state understanding/return demonstration of topics presented.  Learning Barriers/Preferences:  Learning Barriers/Preferences - 07/04/24 0917       Learning Barriers/Preferences   Learning Barriers Hearing   has hearing aids   Learning Preferences None          Education Topics: Know Your Numbers Group instruction that is supported by a PowerPoint presentation. Instructor discusses importance of knowing and understanding resting, exercise, and post-exercise oxygen  saturation, heart rate, and blood pressure. Oxygen  saturation, heart rate, blood pressure, rating of perceived exertion, and dyspnea are reviewed along with a normal range for these values.    Exercise for the Pulmonary Patient Group instruction that is supported by a PowerPoint presentation. Instructor discusses benefits of exercise, core components of exercise, frequency, duration, and intensity of an exercise routine, importance of utilizing pulse oximetry during exercise, safety while exercising, and options of places to exercise outside of rehab.    MET Level  Group instruction provided by PowerPoint, verbal discussion, and written material to support subject matter. Instructor reviews what METs are and how to increase METs.    Pulmonary Medications Verbally  interactive group education provided by instructor with focus on inhaled medications and proper administration.   Anatomy and Physiology of the Respiratory System Group instruction provided by PowerPoint, verbal discussion, and written material to support subject matter. Instructor reviews respiratory cycle and anatomical components of the respiratory system and their functions. Instructor also reviews differences in obstructive and restrictive respiratory diseases with examples of each.  Flowsheet Row PULMONARY REHAB OTHER RESPIRATORY from 07/10/2024 in [facility]  Date 07/10/24  Educator RT  Instruction Review Code 1- Verbalizes Understanding    Oxygen  Safety Group instruction provided by PowerPoint, verbal discussion, and written material to support subject matter. There is an overview of "What is Oxygen " and "Why do we need it".  Instructor also reviews how to create a safe environment for oxygen  use, the importance of using oxygen  as prescribed, and the risks of noncompliance. There is a brief discussion on traveling with oxygen  and resources the patient may utilize.   Oxygen  Use Group instruction provided by PowerPoint, verbal discussion, and written material to discuss how supplemental oxygen  is prescribed and different types of oxygen  supply  systems. Resources for more information are provided.    Breathing Techniques Group instruction that is supported by demonstration and informational handouts. Instructor discusses the benefits of pursed lip and diaphragmatic breathing and detailed demonstration on how to perform both.     Risk Factor Reduction Group instruction that is supported by a PowerPoint presentation. Instructor discusses the definition of a risk factor, different risk factors for pulmonary disease, and how the heart and lungs work together.   Pulmonary Diseases Group instruction provided by PowerPoint, verbal  discussion, and written material to support subject matter. Instructor gives an overview of the different type of pulmonary diseases. There is also a discussion on risk factors and symptoms as well as ways to manage the diseases.   Stress and Energy Conservation Group instruction provided by PowerPoint, verbal discussion, and written material to support subject matter. Instructor gives an overview of stress and the impact it can have on the body. Instructor also reviews ways to reduce stress. There is also a discussion on energy conservation and ways to conserve energy throughout the day.   Warning Signs and Symptoms Group instruction provided by PowerPoint, verbal discussion, and written material to support subject matter. Instructor reviews warning signs and symptoms of stroke, heart attack, cold and flu. Instructor also reviews ways to prevent the spread of infection.   Other Education Group or individual verbal, written, or video instructions that support the educational goals of the pulmonary rehab program. Flowsheet Row Pulmonary Rehab from 01/02/2018 in [facility]  Date 12/05/17  Educator [facility]  Instruction Review Code 1- Verbalizes Understanding  [SLEEP]     Knowledge Questionnaire Score:  Knowledge Questionnaire Score - 07/04/24 9047       Knowledge Questionnaire Score   Pre Score 18/18          Core Components/Risk Factors/Patient Goals at Admission:  Personal Goals and Risk Factors at Admission - 07/04/24 0917       Core Components/Risk Factors/Patient Goals on Admission   Admit Weight --    Goal Weight: Short Term --    Goal Weight: Long Term --    Improve shortness of breath with ADL's Yes    Intervention Provide education, individualized exercise plan and daily activity instruction to help decrease symptoms of SOB with activities of daily living.    Expected Outcomes Short Term: Improve cardiorespiratory fitness to achieve a reduction of  symptoms when performing ADLs;Long Term: Be able to perform more ADLs without symptoms or delay the onset of symptoms    Expected Outcomes --          Core Components/Risk Factors/Patient Goals Review:   Goals and Risk Factor Review     Row Name 05/19/24 1613 07/09/24 1610           Core Components/Risk Factors/Patient Goals Review   Personal Goals Review Improve shortness of breath with ADL's Improve shortness of breath with ADL's;Develop more efficient breathing techniques such as purse lipped breathing and diaphragmatic breathing and practicing self-pacing with activity.      Review Spoke to patient about their shortness of breath and what they can do to improve. Patient has been informed of breathing techniques when starting the program. Patient is informed to tell staff if they have had any med changes and that certain meds they are taking or not taking can be causing shortness of breath. Monthly review of patient's Core Components/Risk Factors/Patient Goals are as follows: Unable to assess goals. Ronalda hasn't started the program yet.  She is scheduled to start tomorrow, 07/10/24.      Expected Outcomes Short: Attend [name] regularly to improve shortness of breath with ADL's. Long: maintain independence with ADL's Pt will show progress toward meeting expected goals and outcomes.         Core Components/Risk Factors/Patient Goals at Discharge (Final Review):   Goals and Risk Factor Review - 07/09/24 1610       Core Components/Risk Factors/Patient Goals Review   Personal Goals Review Improve shortness of breath with ADL's;Develop more efficient breathing techniques such as purse lipped breathing and diaphragmatic breathing and practicing self-pacing with activity.    Review Monthly review of patient's Core Components/Risk Factors/Patient Goals are as follows: Unable to assess goals. Nelline hasn't started the program yet. She is scheduled to start tomorrow, 07/10/24.    Expected  Outcomes Pt will show progress toward meeting expected goals and outcomes.          ITP Comments: Pt is making expected progress toward Pulmonary Rehab goals after completing 2 session(s). Recommend continued exercise, life style modification, education, and utilization of breathing techniques to increase stamina and strength, while also decreasing shortness of breath with exertion.  Dr. Slater Staff is Medical Director for Pulmonary Rehab at [facility].

## 2024-07-17 ENCOUNTER — Encounter (HOSPITAL_COMMUNITY)
Admission: RE | Admit: 2024-07-17 | Discharge: 2024-07-17 | Disposition: A | Discharge status: Home / Self Care | Source: Ambulatory Visit | Attending: Pulmonary Disease | Admitting: Pulmonary Disease

## 2024-07-17 DIAGNOSIS — I272 Pulmonary hypertension, unspecified: Secondary | ICD-10-CM

## 2024-07-17 NOTE — Progress Notes (Signed)
 Daily Session Note  Patient Details  Name: [name] MRN: [id number] Date of Birth: 03/26/1946 Referring Provider:   [name] Pulmonary Rehab Walk Test from 07/04/2024 in [facility] for Heart, Vascular, & Lung Health  Referring Provider [name]    Encounter Date: 07/17/2024  Check In:  Session Check In - 07/17/24 1355       Check-In   Supervising physician immediately available to respond to emergencies [facility] MD immediately available    Physician(s) Barnie Hila, NP    Location [facility]    Staff Present Ronal Levin, RN, Maud Moats, MS, ACSM-CEP, Exercise Physiologist;Casey Claudene Idelia Aris BS, ACSM-CEP, Exercise Physiologist;Bailey Elnor, MS, Exercise Physiologist    Virtual Visit No    Medication changes reported     No    Fall or balance concerns reported    No    Tobacco Cessation No Change    Warm-up and Cool-down Performed as group-led instruction    Resistance Training Performed Yes    VAD Patient? No    PAD/SET Patient? No      VAD patient   Has back up controller? No      Pain Assessment   Currently in Pain? No/denies    Multiple Pain Sites No          Capillary Blood Glucose: No results found. However, due to the size of the patient record, not all encounters were searched. Please check Results Review for a complete set of results.    Social History   Tobacco Use  Smoking Status Former   Current packs/day: 0.00   Average packs/day: 1 pack/day for 40.0 years (40.0 ttl pk-yrs)   Types: Cigarettes   Start date: 09/15/1967   Quit date: 09/15/2007   Years since quitting: 16.8  Smokeless Tobacco Never  Tobacco Comments   Former smoker Quit 2008    Goals Met:  Proper associated with RPD/PD & O2 Sat Exercise tolerated well No report of concerns or symptoms today Strength training completed today  Goals Unmet:  Not Applicable  Comments: Service time is from 1308 to 1445.    Dr.  Slater Staff is Medical Director for Pulmonary Rehab at [facility].

## 2024-07-18 ENCOUNTER — Ambulatory Visit

## 2024-07-18 DIAGNOSIS — Z23 Encounter for immunization: Secondary | ICD-10-CM | POA: Diagnosis not present

## 2024-07-21 ENCOUNTER — Encounter

## 2024-07-22 ENCOUNTER — Encounter (HOSPITAL_COMMUNITY)
Admission: RE | Admit: 2024-07-22 | Discharge: 2024-07-22 | Disposition: A | Discharge status: Home / Self Care | Source: Ambulatory Visit | Attending: Pulmonary Disease | Admitting: Pulmonary Disease

## 2024-07-22 DIAGNOSIS — I272 Pulmonary hypertension, unspecified: Secondary | ICD-10-CM

## 2024-07-22 NOTE — Progress Notes (Signed)
 Daily Session Note  Patient Details  Name: [name] MRN: [id number] Date of Birth: [birth year] Referring Provider:   [name] Pulmonary Rehab Walk Test from 07/04/2024 in [facility] for Heart, Vascular, & Lung Health  Referring Provider [name]    Encounter Date: 07/22/2024  Check In:  Session Check In - 07/22/24 1414       Check-In   Supervising physician immediately available to respond to emergencies [facility] MD immediately available    Physician(s) Josefa Moots, NP    [facility]    Staff Present Ronal Levin, RN, Maud Moats, MS, ACSM-CEP, Exercise Physiologist;Casey Claudene Idelia Aris BS, ACSM-CEP, Exercise Physiologist    Virtual Visit No    Medication changes reported     No    Fall or balance concerns reported    No    Tobacco Cessation No Change    Warm-up and Cool-down Performed as group-led instruction    Resistance Training Performed Yes    VAD Patient? No    PAD/SET Patient? No      VAD patient   Has back up controller? No      Pain Assessment   Currently in Pain? No/denies    Multiple Pain Sites No          Capillary Blood Glucose: No results found. However, due to the size of the patient record, not all encounters were searched. Please check Results Review for a complete set of results.    Social History   Tobacco Use  Smoking Status Former   Current packs/day: 0.00   Average packs/day: 1 pack/day for 40.0 years (40.0 ttl pk-yrs)   Types: Cigarettes   Start date: 09/15/1967   Quit date: 09/15/2007   Years since quitting: 16.8  Smokeless Tobacco Never  Tobacco Comments   Former smoker Quit 2008    Goals Met:  Independence with exercise equipment Exercise tolerated well No report of concerns or symptoms today Strength training completed today  Goals Unmet:  Not Applicable  Comments: Service time is from 1310 to 1450.    Dr. Slater Staff is Medical Director for Pulmonary  Rehab at [facility].

## 2024-07-23 ENCOUNTER — Encounter

## 2024-07-24 ENCOUNTER — Encounter (HOSPITAL_COMMUNITY)
Admission: RE | Admit: 2024-07-24 | Discharge: 2024-07-24 | Disposition: A | Discharge status: Home / Self Care | Source: Ambulatory Visit | Attending: Pulmonary Disease | Admitting: Pulmonary Disease

## 2024-07-24 DIAGNOSIS — I272 Pulmonary hypertension, unspecified: Secondary | ICD-10-CM

## 2024-07-24 NOTE — Progress Notes (Signed)
 Daily Session Note  Patient Details  Name: [name] MRN: [id number] Date of Birth: July 22, 1946 Referring Provider:   [name] Pulmonary Rehab Walk Test from 07/04/2024 in [facility] for Heart, Vascular, & Lung Health  Referring Provider [name]    Encounter Date: 07/24/2024  Check In:  Session Check In - 07/24/24 1335       Check-In   Supervising physician immediately available to respond to emergencies [facility] MD immediately available    Physician(s) Orren Fabry, PA    Location [facility]    Staff Present Ronal Levin, RN, Maud Moats, MS, ACSM-CEP, Exercise Physiologist;Demareon Coldwell Claudene Idelia Aris BS, ACSM-CEP, Exercise Physiologist    Virtual Visit No    Medication changes reported     No    Fall or balance concerns reported    No    Tobacco Cessation No Change    Warm-up and Cool-down Performed as group-led instruction    Resistance Training Performed Yes    VAD Patient? No    PAD/SET Patient? No      VAD patient   Has back up controller? No      Pain Assessment   Currently in Pain? No/denies    Multiple Pain Sites No          Capillary Blood Glucose: No results found. However, due to the size of the patient record, not all encounters were searched. Please check Results Review for a complete set of results.    Social History   Tobacco Use  Smoking Status Former   Current packs/day: 0.00   Average packs/day: 1 pack/day for 40.0 years (40.0 ttl pk-yrs)   Types: Cigarettes   Start date: 09/15/1967   Quit date: 09/15/2007   Years since quitting: 16.8  Smokeless Tobacco Never  Tobacco Comments   Former smoker Quit 2008    Goals Met:  Proper associated with RPD/PD & O2 Sat Independence with exercise equipment Exercise tolerated well No report of concerns or symptoms today Strength training completed today  Goals Unmet:  Not Applicable  Comments: Service time is from 1313 to 1443.    Dr. Slater Staff is Medical Director for Pulmonary Rehab at [facility].

## 2024-07-25 ENCOUNTER — Ambulatory Visit

## 2024-07-29 ENCOUNTER — Encounter (HOSPITAL_COMMUNITY)
Admission: RE | Admit: 2024-07-29 | Discharge: 2024-07-29 | Disposition: A | Discharge status: Home / Self Care | Source: Ambulatory Visit | Attending: Pulmonary Disease | Admitting: Pulmonary Disease

## 2024-07-29 ENCOUNTER — Ambulatory Visit: Attending: Cardiovascular Disease | Admitting: Cardiovascular Disease

## 2024-07-29 ENCOUNTER — Encounter: Payer: Self-pay | Admitting: Cardiovascular Disease

## 2024-07-29 VITALS — Wt 146.8 lb

## 2024-07-29 VITALS — BP 133/74 | HR 60 | Ht 63.0 in | Wt 145.6 lb

## 2024-07-29 DIAGNOSIS — I701 Atherosclerosis of renal artery: Secondary | ICD-10-CM | POA: Insufficient documentation

## 2024-07-29 DIAGNOSIS — I480 Paroxysmal atrial fibrillation: Secondary | ICD-10-CM | POA: Insufficient documentation

## 2024-07-29 DIAGNOSIS — I25810 Atherosclerosis of coronary artery bypass graft(s) without angina pectoris: Secondary | ICD-10-CM | POA: Insufficient documentation

## 2024-07-29 DIAGNOSIS — I272 Pulmonary hypertension, unspecified: Secondary | ICD-10-CM | POA: Diagnosis not present

## 2024-07-29 DIAGNOSIS — K551 Chronic vascular disorders of intestine: Secondary | ICD-10-CM | POA: Diagnosis not present

## 2024-07-29 DIAGNOSIS — I10 Essential (primary) hypertension: Secondary | ICD-10-CM | POA: Insufficient documentation

## 2024-07-29 DIAGNOSIS — I739 Peripheral vascular disease, unspecified: Secondary | ICD-10-CM | POA: Diagnosis not present

## 2024-07-29 DIAGNOSIS — E785 Hyperlipidemia, unspecified: Secondary | ICD-10-CM | POA: Insufficient documentation

## 2024-07-29 NOTE — Progress Notes (Signed)
 Daily Session Note  Patient Details  Name: [name] MRN: [id number] Date of Birth: 1946/05/03 Referring Provider:   [name] Pulmonary Rehab Walk Test from 07/04/2024 in [facility] for Heart, Vascular, & Lung Health  Referring Provider [name]    Encounter Date: 07/29/2024  Check In:  Session Check In - 07/29/24 1304       Check-In   Supervising physician immediately available to respond to emergencies [facility] MD immediately available    Physician(s) Rosabel Mose, NP    [facility]    Staff Present Ronal Levin, RN, Maud Moats, MS, ACSM-CEP, Exercise Physiologist;Casey Claudene, RT;Randi Midge BS, ACSM-CEP, Exercise Physiologist;Johnny Fayette, MS, Exercise Physiologist    Virtual Visit No    Medication changes reported     No    Fall or balance concerns reported    No    Tobacco Cessation No Change    Warm-up and Cool-down Performed as group-led instruction    Resistance Training Performed Yes    VAD Patient? No    PAD/SET Patient? No      VAD patient   Has back up controller? No      Pain Assessment   Currently in Pain? No/denies    Multiple Pain Sites No          Capillary Blood Glucose: No results found. However, due to the size of the patient record, not all encounters were searched. Please check Results Review for a complete set of results.   Exercise Prescription Changes - 07/29/24 1500       Response to Exercise   Blood Pressure (Admit) 112/66    Blood Pressure (Exercise) 158/72    Blood Pressure (Exit) 118/66    Heart Rate (Admit) 70 bpm    Heart Rate (Exercise) 92 bpm    Heart Rate (Exit) 70 bpm    Oxygen  Saturation (Admit) 97 %    Oxygen  Saturation (Exercise) 92 %    Oxygen  Saturation (Exit) 95 %    Rating of Perceived Exertion (Exercise) 14    Perceived Dyspnea (Exercise) 3    Duration Continue with 30 min of aerobic exercise without signs/symptoms of physical distress.    Intensity THRR  unchanged      Progression   Progression Continue to progress workloads to maintain intensity without signs/symptoms of physical distress.      Resistance Training   Training Prescription Yes    Weight blue bands    Reps 10-15    Time 10 Minutes      Treadmill   MPH 2.7    Grade 0    Minutes 15    METs 2.9      NuStep   Level 1    SPM 102    Minutes 15    METs 3      Oxygen    Maintain Oxygen  Saturation 88% or higher          Social History   Tobacco Use  Smoking Status Former   Current packs/day: 0.00   Average packs/day: 1 pack/day for 40.0 years (40.0 ttl pk-yrs)   Types: Cigarettes   Start date: 09/15/1967   Quit date: 09/15/2007   Years since quitting: 16.8  Smokeless Tobacco Never  Tobacco Comments   Former smoker Quit 2008    Goals Met:  Proper associated with RPD/PD & O2 Sat Exercise tolerated well No report of concerns or symptoms today Strength training completed today  Goals Unmet:  Not Applicable  Comments: Service time is from 1310 to 80.   Dr. Slater Staff is Medical Director for Pulmonary Rehab at [facility].
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
## 2024-07-29 NOTE — Patient Instructions (Addendum)
 Medication Instructions:  STOP Ezetimibe  (Zetia ) 10 mg once daily  *If you need a refill on your cardiac medications before your next appointment, please call your pharmacy*  Lab Work: None ordered If you have labs (blood work) drawn today and your tests are completely normal, you will receive your results only by: MyChart Message (if you have MyChart) OR A paper copy in the mail If you have any lab test that is abnormal or we need to change your treatment, we will call you to review the results.  Testing/Procedures: None ordered  Follow-Up: At [facility], you and your health needs are our priority.  As part of our continuing mission to provide you with exceptional heart care, our providers are all part of one team.  This team includes your primary Cardiologist (physician) and Advanced Practice Providers or APPs (Physician Assistants and Nurse Practitioners) who all work together to provide you with the care you need, when you need it.  Your next appointment:   6 month(s)  Provider:   Deatrice Cage, MD    We recommend signing up for the patient portal called MyChart.  Sign up information is provided on this After Visit Summary.  MyChart is used to connect with patients for Virtual Visits (Telemedicine).  Patients are able to view lab/test results, encounter notes, upcoming appointments, etc.  Non-urgent messages can be sent to your provider as well.   To learn more about what you can do with MyChart, go to [email or web address].

## 2024-07-30 ENCOUNTER — Encounter: Payer: Self-pay | Admitting: Pulmonary Disease

## 2024-07-30 ENCOUNTER — Ambulatory Visit (INDEPENDENT_AMBULATORY_CARE_PROVIDER_SITE_OTHER): Admitting: Pulmonary Disease

## 2024-07-30 VITALS — BP 117/69 | HR 59 | Temp 97.5°F | Ht 63.0 in | Wt 146.6 lb

## 2024-07-30 DIAGNOSIS — J439 Emphysema, unspecified: Secondary | ICD-10-CM | POA: Diagnosis not present

## 2024-07-30 DIAGNOSIS — R0609 Other forms of dyspnea: Secondary | ICD-10-CM | POA: Diagnosis not present

## 2024-07-30 DIAGNOSIS — Z87891 Personal history of nicotine dependence: Secondary | ICD-10-CM

## 2024-07-30 DIAGNOSIS — J432 Centrilobular emphysema: Secondary | ICD-10-CM

## 2024-07-30 MED ORDER — ALBUTEROL SULFATE HFA 108 (90 BASE) MCG/ACT IN AERS: 2.0000 | INHALATION_SPRAY | Freq: Four times a day (QID) | RESPIRATORY_TRACT | 5 refills | Status: AC | PRN

## 2024-07-30 MED ORDER — UMECLIDINIUM-VILANTEROL 62.5-25 MCG/ACT IN AEPB: 1.0000 | INHALATION_SPRAY | Freq: Every day | RESPIRATORY_TRACT | 11 refills | Status: AC

## 2024-07-30 NOTE — Patient Instructions (Signed)
 Nice to see you again  Continue Anoro - refilled today  Return to clinic in 6 months or sooner as needed
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
## 2024-07-31 ENCOUNTER — Encounter (HOSPITAL_COMMUNITY)
Admission: RE | Admit: 2024-07-31 | Discharge: 2024-07-31 | Disposition: A | Discharge status: Home / Self Care | Source: Ambulatory Visit | Attending: Pulmonary Disease | Admitting: Pulmonary Disease

## 2024-07-31 DIAGNOSIS — I272 Pulmonary hypertension, unspecified: Secondary | ICD-10-CM

## 2024-07-31 NOTE — Progress Notes (Signed)
 Daily Session Note  Patient Details  Name: [name] MRN: [id number] Date of Birth: 02-02-1946 Referring Provider:   [name] Pulmonary Rehab Walk Test from 07/04/2024 in [facility]  Referring Provider [name]    Encounter Date: 07/31/2024  Check In:  Session Check In - 07/31/24 1335       Check-In   Supervising physician immediately available to respond to emergencies [facility] MD immediately available    Physician(s) Damien Braver, NP    Location [facility]    Staff Present Ronal Levin, RN, Maud Moats, MS, ACSM-CEP, Exercise Physiologist;Casey Claudene Idelia Aris BS, ACSM-CEP, Exercise Physiologist    Virtual Visit No    Medication changes reported     No    Fall or balance concerns reported    No    Tobacco Cessation No Change    Warm-up and Cool-down Performed as group-led instruction    Resistance Training Performed Yes    VAD Patient? No    PAD/SET Patient? No      VAD patient   Has back up controller? No      Pain Assessment   Currently in Pain? No/denies    Multiple Pain Sites No          Capillary Blood Glucose: No results found. However, due to the size of the patient record, not all encounters were searched. Please check Results Review for a complete set of results.    Social History   Tobacco Use  Smoking Status Former   Current packs/day: 0.00   Average packs/day: 1 pack/day for 40.0 years (40.0 ttl pk-yrs)   Types: Cigarettes   Start date: 09/15/1967   Quit date: 09/15/2007   Years since quitting: 16.8  Smokeless Tobacco Never  Tobacco Comments   Former smoker Quit 2008    Goals Met:  Independence with exercise equipment Exercise tolerated well No report of concerns or symptoms today Strength training completed today  Goals Unmet:  Not Applicable  Comments: Service time is from 1314 to 1451    Dr. Slater Staff is Medical Director for Pulmonary  Rehab at [facility].

## 2024-08-01 ENCOUNTER — Telehealth: Payer: Self-pay

## 2024-08-01 ENCOUNTER — Ambulatory Visit

## 2024-08-01 NOTE — Telephone Encounter (Signed)
 Received Ohtuvayre  new start paperwork. Completed form and faxed with clinicals and insurance card copy to [facility]   Phone#: [phone] Fax#: [phone]

## 2024-08-01 NOTE — Telephone Encounter (Signed)
 Received fax from [employer] confirming receipt of Ohtuvayre  enrollment form  Patient ID: [id number]

## 2024-08-04 ENCOUNTER — Ambulatory Visit

## 2024-08-04 NOTE — Telephone Encounter (Signed)
 Received fax from [employer] with summary of benefits. Referral form for Ohtuvayre  received. Rx will be triaged to [facility].. Once benefits investigation completed, pharmacy will reach out the patient to schedule shipment. If medication is unaffordable, patient will need to express financial hardship to be referred back to [place] [facility] for patient assistance program pre-screening.   Patient ID: [id number] Pharmacy phone: [phone] [place] [facility] Phone#: [phone]

## 2024-08-05 ENCOUNTER — Encounter (HOSPITAL_COMMUNITY)
Admission: RE | Admit: 2024-08-05 | Discharge: 2024-08-05 | Disposition: A | Discharge status: Home / Self Care | Source: Ambulatory Visit | Attending: Pulmonary Disease | Admitting: Pulmonary Disease

## 2024-08-05 DIAGNOSIS — I272 Pulmonary hypertension, unspecified: Secondary | ICD-10-CM

## 2024-08-05 NOTE — Progress Notes (Signed)
 Daily Session Note  Patient Details  Name: [name] MRN: [id number] Date of Birth: February 11, 1946 Referring Provider:   [name] Pulmonary Rehab Walk Test from 07/04/2024 in [facility]  Referring Provider [name]    Encounter Date: 08/05/2024  Check In:  Session Check In - 08/05/24 1355       Check-In   Supervising physician immediately available to respond to emergencies [facility] MD immediately available    Physician(s) Damien Braver, NP    [facility]    Staff Present Ronal Levin, RN, Maud Moats, MS, ACSM-CEP, Exercise Physiologist;Casey Claudene Idelia Aris BS, ACSM-CEP, Exercise Physiologist    Virtual Visit No    Medication changes reported     No    Fall or balance concerns reported    No    Tobacco Cessation No Change    Warm-up and Cool-down Performed as group-led instruction    Resistance Training Performed Yes    VAD Patient? No    PAD/SET Patient? No      VAD patient   Has back up controller? No      Pain Assessment   Currently in Pain? No/denies    Multiple Pain Sites No          Capillary Blood Glucose: No results found. However, due to the size of the patient record, not all encounters were searched. Please check Results Review for a complete set of results.    Social History   Tobacco Use  Smoking Status Former   Current packs/day: 0.00   Average packs/day: 1 pack/day for 40.0 years (40.0 ttl pk-yrs)   Types: Cigarettes   Start date: 09/15/1967   Quit date: 09/15/2007   Years since quitting: 16.9  Smokeless Tobacco Never  Tobacco Comments   Former smoker Quit 2008    Goals Met:  Independence with exercise equipment Exercise tolerated well No report of concerns or symptoms today Strength training completed today  Goals Unmet:  Not Applicable  Comments: Service time is from 1320 to 1452    Dr. Slater Staff is Medical Director for Pulmonary  Rehab at [facility].

## 2024-08-06 ENCOUNTER — Ambulatory Visit

## 2024-08-07 ENCOUNTER — Encounter (HOSPITAL_COMMUNITY)
Admission: RE | Admit: 2024-08-07 | Discharge: 2024-08-07 | Disposition: A | Discharge status: Home / Self Care | Source: Ambulatory Visit | Attending: Pulmonary Disease | Admitting: Pulmonary Disease

## 2024-08-07 DIAGNOSIS — I272 Pulmonary hypertension, unspecified: Secondary | ICD-10-CM

## 2024-08-07 NOTE — Progress Notes (Signed)
 Daily Session Note  Patient Details  Name: [name] MRN: [id number] Date of Birth: 1946-10-06 Referring Provider:   [name] Pulmonary Rehab Walk Test from 07/04/2024 in [facility]  Referring Provider [name]    Encounter Date: 08/07/2024  Check In:  Session Check In - 08/07/24 1341       Check-In   Supervising physician immediately available to respond to emergencies [facility] MD immediately available    Physician(s) Lum Louis, NP    [facility]    Staff Present Ronal Levin, RN, BSN;Casey Claudene, RT;Randi [place] BS, ACSM-CEP, Exercise Physiologist    Virtual Visit No    Medication changes reported     No    Fall or balance concerns reported    No    Tobacco Cessation No Change    Warm-up and Cool-down Performed as group-led instruction    Resistance Training Performed Yes    VAD Patient? No    PAD/SET Patient? No      VAD patient   Has back up controller? No      Pain Assessment   Currently in Pain? No/denies    Multiple Pain Sites No          Capillary Blood Glucose: No results found. However, due to the size of the patient record, not all encounters were searched. Please check Results Review for a complete set of results.    Social History   Tobacco Use  Smoking Status Former   Current packs/day: 0.00   Average packs/day: 1 pack/day for 40.0 years (40.0 ttl pk-yrs)   Types: Cigarettes   Start date: 09/15/1967   Quit date: 09/15/2007   Years since quitting: 16.9  Smokeless Tobacco Never  Tobacco Comments   Former smoker Quit 2008    Goals Met:  Independence with exercise equipment Exercise tolerated well No report of concerns or symptoms today Strength training completed today  Goals Unmet:  Not Applicable  Comments: Service time is from 1316 to 1453    Dr. Slater Staff is Medical Director for Pulmonary Rehab at [facility].

## 2024-08-08 ENCOUNTER — Ambulatory Visit

## 2024-08-11 ENCOUNTER — Ambulatory Visit: Admitting: Cardiology

## 2024-08-11 ENCOUNTER — Ambulatory Visit

## 2024-08-11 ENCOUNTER — Ambulatory Visit: Admitting: Student

## 2024-08-12 ENCOUNTER — Encounter (HOSPITAL_COMMUNITY)
Admission: RE | Admit: 2024-08-12 | Discharge: 2024-08-12 | Disposition: A | Source: Ambulatory Visit | Attending: Pulmonary Disease | Admitting: Pulmonary Disease

## 2024-08-12 VITALS — Wt 147.0 lb

## 2024-08-12 DIAGNOSIS — I272 Pulmonary hypertension, unspecified: Secondary | ICD-10-CM

## 2024-08-12 NOTE — Progress Notes (Signed)
 Daily Session Note  Patient Details  Name: [name] MRN: [id number] Date of Birth: 07-11-1946 Referring Provider:   [name] Pulmonary Rehab Walk Test from 07/04/2024 in [facility]  Referring Provider [name]    Encounter Date: 08/12/2024  Check In:  Session Check In - 08/12/24 1421       Check-In   Supervising physician immediately available to respond to emergencies [facility] MD immediately available    Physician(s) Rosaline Skains, NP    [facility]    Staff Present Ronal Levin, RN, BSN;Casey Claudene Neita Moats, MS, ACSM-CEP, Exercise Physiologist    Virtual Visit No    Medication changes reported     No    Fall or balance concerns reported    No    Tobacco Cessation No Change    Warm-up and Cool-down Performed as group-led instruction    Resistance Training Performed Yes    VAD Patient? No    PAD/SET Patient? No      VAD patient   Has back up controller? No      Pain Assessment   Currently in Pain? No/denies    Multiple Pain Sites No          Capillary Blood Glucose: No results found. However, due to the size of the patient record, not all encounters were searched. Please check Results Review for a complete set of results.   Exercise Prescription Changes - 08/12/24 1500       Response to Exercise   Blood Pressure (Admit) 132/58    Blood Pressure (Exercise) 132/58    Blood Pressure (Exit) 114/62    Heart Rate (Admit) 67 bpm    Heart Rate (Exercise) 78 bpm    Heart Rate (Exit) 68 bpm    Oxygen  Saturation (Admit) 100 %    Oxygen  Saturation (Exercise) 97 %    Oxygen  Saturation (Exit) 97 %    Rating of Perceived Exertion (Exercise) 13    Perceived Dyspnea (Exercise) 3    Duration Continue with 30 min of aerobic exercise without signs/symptoms of physical distress.    Intensity THRR unchanged      Progression   Progression Continue to progress workloads to  maintain intensity without signs/symptoms of physical distress.      Resistance Training   Training Prescription Yes    Weight blue bands    Reps 10-15    Time 10 Minutes      Treadmill   MPH 2.2    Grade 0    Minutes 15    METs 2.5      NuStep   Level 1    SPM 94    Minutes 15    METs 2.7      Oxygen    Maintain Oxygen  Saturation 88% or higher          Social History   Tobacco Use  Smoking Status Former   Current packs/day: 0.00   Average packs/day: 1 pack/day for 40.0 years (40.0 ttl pk-yrs)   Types: Cigarettes   Start date: 09/15/1967   Quit date: 09/15/2007   Years since quitting: 16.9  Smokeless Tobacco Never  Tobacco Comments   Former smoker Quit 2008    Goals Met:  Independence with exercise equipment Exercise tolerated well No report of concerns or symptoms today Strength training completed today  Goals Unmet:  Not Applicable  Comments: Service time is from 1309 to 1434  Dr. Slater Staff is Medical Director for Pulmonary Rehab at [facility].

## 2024-08-13 ENCOUNTER — Other Ambulatory Visit: Payer: Self-pay | Admitting: Cardiology

## 2024-08-13 ENCOUNTER — Ambulatory Visit

## 2024-08-13 DIAGNOSIS — L218 Other seborrheic dermatitis: Secondary | ICD-10-CM | POA: Diagnosis not present

## 2024-08-13 DIAGNOSIS — L821 Other seborrheic keratosis: Secondary | ICD-10-CM | POA: Diagnosis not present

## 2024-08-13 DIAGNOSIS — Z09 Encounter for follow-up examination after completed treatment for conditions other than malignant neoplasm: Secondary | ICD-10-CM | POA: Diagnosis not present

## 2024-08-13 DIAGNOSIS — D0472 Carcinoma in situ of skin of left lower limb, including hip: Secondary | ICD-10-CM | POA: Diagnosis not present

## 2024-08-13 DIAGNOSIS — D485 Neoplasm of uncertain behavior of skin: Secondary | ICD-10-CM | POA: Diagnosis not present

## 2024-08-13 DIAGNOSIS — L570 Actinic keratosis: Secondary | ICD-10-CM | POA: Diagnosis not present

## 2024-08-13 DIAGNOSIS — Z85828 Personal history of other malignant neoplasm of skin: Secondary | ICD-10-CM | POA: Diagnosis not present

## 2024-08-13 DIAGNOSIS — D1801 Hemangioma of skin and subcutaneous tissue: Secondary | ICD-10-CM | POA: Diagnosis not present

## 2024-08-13 NOTE — Progress Notes (Addendum)
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 use RPE daily in rehab to express subjective intensity level;Long Term:  Able to use RPE to guide intensity level when exercising independently      Able to understand and use Dyspnea scale Yes Yes      Intervention Provide education and explanation on how to use Dyspnea scale Provide education and explanation on how to use Dyspnea scale      Expected Outcomes Short Term: Able to use Dyspnea scale daily in rehab to express subjective sense of shortness of breath during exertion;Long Term: Able to use Dyspnea scale to guide intensity level when exercising independently Short Term: Able to use Dyspnea scale daily in rehab to express subjective sense of shortness of breath during exertion;Long Term: Able to use Dyspnea scale to guide intensity level when exercising independently      Knowledge and understanding of Target Heart Rate Range (THRR)  Yes Yes      Intervention Provide education and explanation of THRR including how the numbers were predicted and where they are  located for reference Provide education and explanation of THRR including how the numbers were predicted and where they are located for reference      Expected Outcomes Short Term: Able to state/look up THRR;Long Term: Able to use THRR to govern intensity when exercising independently;Short Term: Able to use daily as guideline for intensity in rehab Short Term: Able to state/look up THRR;Long Term: Able to use THRR to govern intensity when exercising independently;Short Term: Able to use daily as guideline for intensity in rehab      Able to check pulse independently Yes --      Intervention Provide education and demonstration on how to check pulse in carotid and radial arteries.;Review the importance of being able to check your own pulse for safety during independent exercise --      Expected Outcomes Short Term: Able to explain why pulse checking is important during independent exercise;Long Term: Able to check pulse independently and accurately --      Understanding of Exercise Prescription Yes Yes      Intervention Provide education, explanation, and written materials on patient's individual exercise prescription Provide education, explanation, and written materials on patient's individual exercise prescription      Expected Outcomes Long Term: Able to explain home exercise prescription to exercise independently;Short Term: Able to explain program exercise prescription Long Term: Able to explain home exercise prescription to exercise independently;Short Term: Able to explain program exercise prescription         Exercise Goals Re-Evaluation :  Exercise Goals Re-Evaluation     Row Name 03/03/24 1601 03/13/24 1556 03/27/24 1526 04/11/24 1044 04/24/24 1313     Exercise Goal Re-Evaluation   Exercise Goals Review Increase Physical Activity;Able to understand and use rate of  perceived exertion (RPE) scale;Knowledge and understanding of Target Heart Rate Range (THRR);Understanding of Exercise Prescription;Increase Strength and Stamina;Able to check pulse independently;Able to understand and use Dyspnea scale Increase Physical Activity;Increase Strength and Stamina;Understanding of Exercise Prescription Increase Physical Activity;Increase Strength and Stamina;Understanding of Exercise Prescription Increase Physical Activity;Increase Strength and Stamina;Understanding of Exercise Prescription Increase Physical Activity;Increase Strength and Stamina;Understanding of Exercise Prescription   Comments Reviewed RPE and dyspnea scale, THR and program prescription with pt today.  Pt voiced understanding and was given a copy of goals to take home. Destenee is off to a good start in the program. She did well during her first three sessions on the treadmill at a speed of 2.3 mph with a 1% incline. She also did well at level 3 on the T4 nustep and level 1 on both the T5 nustep and biostep. We will continue to monitor her progress in the program. Yemaya is doing well in rehab. She maintained her workload on the treadmill with a speed of 2.3 mph and incline of 1%. She also maintained level 3 on the T4 nustep. We will continue to monitor her progress in the program. Jashawna has not attended rehab since 4/30. We will attempt to reach out to determine her progress in the program. Ceara has not attended the program since 4/30. We have reached out and she states that she plans to return 6/23.   Expected Outcomes Short: Use RPE daily to regulate intensity.  Long: Follow program prescription in THR. Short: Continue to follow current exercise prescription. Long: Continue exercise to improve strength and stamina. Short: Progressively increase treadmill and T4 nustep workloads. Long: Continue exercise to improve strength and stamina. Short: return to rehab. Long: Continue exercise to improve strength and  stamina.  Short: return to rehab. Long: Continue exercise to improve strength and stamina.    Row Name 05/08/24 1603 05/19/24 1608 05/22/24 1549 06/04/24 1605 06/19/24 1749     Exercise Goal Re-Evaluation   Exercise Goals Review Increase Physical Activity;Increase Strength and Stamina;Understanding of Exercise Prescription Increase Physical Activity;Able to understand and use rate of perceived exertion (RPE) scale;Knowledge and understanding of Target Heart Rate Range (THRR);Understanding of Exercise Prescription;Increase Strength and Stamina;Able to understand and use Dyspnea scale;Able to check pulse independently Increase Physical Activity;Understanding of Exercise Prescription;Increase Strength and Stamina Increase Physical Activity;Understanding of Exercise Prescription;Increase Strength and Stamina Increase Physical Activity;Understanding of Exercise Prescription;Increase Strength and Stamina   Comments Jhaniya has not attended the program since 4/30. She previously stated that she planned to return to the program on 6/23, but has not returned to the program. We will contact her to determine a new return date. We will continue to monitor her progress when she returns to the program. Reviewed home exercise with pt today from 3:30 pm to 3:42 pm.  Pt plans to walk for exercise.  Reviewed THR, pulse, RPE, sign and symptoms, pulse oximetery and when to call 911 or MD.  Also discussed weather considerations and indoor options.  Pt voiced understanding. Rashanda attended one session in this 2 week review. She increased her workload on the treadmill to a speed of 2.6 mph and incline of 0.5%. She maintained level 3 on the T4 nustep. We will continue to monitor her progress in the program. Yeira is doing well in rehab. She has been able to maintain her current treadmill workload at a speed of 2. and 0.5% incline. She was also able to maintain a level 3 on the T4 nustep. We will continue to monitor her progress  in the program. Gisselle is doing well in rehab. She increased to level 4 on the T4 nustep. She added the T6 nustep at level 4. She also maintained a speed of 2.6 mph on the treadmill, but dropped the incline down to none. We will continue to monitor her progress in the program.   Expected Outcomes Short: Return to rehab when appropriate. Long: Continue exercise to improve strength and stamina. Short: add 1-2 days of walking at home on off days of rehab. Long: maintain independent exercise routine upon graduation from rehab. Short: Attend rehab more consistently. Long: Continue exercise to improve strength and stamina. Short: Continue to follow exercise prescription. Long: Continue exercise to improve strength and stamina. Short: Progressively increase treadmill workload. Long: Continue exercise to improve strength and stamina.    Row Name 06/25/24 1542 07/04/24 0923 07/08/24 0903 08/05/24 1546       Exercise Goal Re-Evaluation   Exercise Goals Review Increase Physical Activity;Increase Strength and Stamina;Understanding of Exercise Prescription Increase Physical Activity;Increase Strength and Stamina;Understanding of Exercise Prescription;Able to understand and use Dyspnea scale;Knowledge and understanding of Target Heart Rate Range (THRR);Able to understand and use rate of perceived exertion (RPE) scale Increase Physical Activity;Increase Strength and Stamina;Understanding of Exercise Prescription;Able to understand and use Dyspnea scale;Knowledge and understanding of Target Heart Rate Range (THRR);Able to understand and use rate of perceived exertion (RPE) scale Increase Physical Activity;Increase Strength and Stamina;Understanding of Exercise Prescription;Able to understand and use Dyspnea scale;Knowledge and understanding of Target Heart Rate Range (THRR);Able to understand and use rate of perceived exertion (RPE) scale    Comments Armya states that she has not currently added home exercise but still  has the information she was given from our program. She is  trying to build back up her strength by attending class regularly and is thinking about eventually adding some walking on off days of rehab when she feels ready. She has access to walking outside and a TM. Harlee is [facility] from [facility] to [facility]. Pt to begin exercise 8/28 here at [facility]. Will progress as tolerated. Pt has completed 8 exercise sessions. She is currently exercising on the recumbent stepper for 15 min, level 1, METs 3.1. She then is walking on the treadmill for 15 min, speed 2.7, METs 2.9. She does not want to increase her resistance on the stepper, prefers to increase speed. She performs warm up and cool down standing, including squats. She is unable to use bands due to a left shoulder injury so she is using a 3 lb weight for her right shoulder/arm. Will progress as tolerated.    Expected Outcomes Short: add 1-2 days a week of exercise at home once she feels ready. continue to attend rehab regularly to build up strength. Long: maintain independent exercise routine upon graduation from rehab. Through exercise at rehab and home, the patient will decrease shortness of breath with daily activities and feel confident in carrying out an exercise regimen at home. Through exercise at rehab and home, the patient will decrease shortness of breath with daily activities and feel confident in carrying out an exercise regimen at home. Through exercise at rehab and home, the patient will decrease shortness of breath with daily activities and feel confident in carrying out an exercise regimen at home.       Discharge Exercise Prescription (Final Exercise Prescription Changes):  Exercise Prescription Changes - 08/12/24 1500       Response to Exercise   Blood Pressure (Admit) 132/58    Blood Pressure (Exercise) 132/58    Blood Pressure (Exit) 114/62    Heart Rate (Admit) 67 bpm    Heart Rate (Exercise) 78 bpm    Heart Rate (Exit) 68 bpm     Oxygen  Saturation (Admit) 100 %    Oxygen  Saturation (Exercise) 97 %    Oxygen  Saturation (Exit) 97 %    Rating of Perceived Exertion (Exercise) 13    Perceived Dyspnea (Exercise) 3    Duration Continue with 30 min of aerobic exercise without signs/symptoms of physical distress.    Intensity THRR unchanged      Progression   Progression Continue to progress workloads to maintain intensity without signs/symptoms of physical distress.      Resistance Training   Training Prescription Yes    Weight blue bands    Reps 10-15    Time 10 Minutes      Treadmill   MPH 2.2    Grade 0    Minutes 15    METs 2.5      NuStep   Level 1    SPM 94    Minutes 15    METs 2.7      Oxygen    Maintain Oxygen  Saturation 88% or higher          Nutrition:  Target Goals: Understanding of nutrition guidelines, daily intake of sodium 1500mg , cholesterol 200mg , calories 30% from fat and 7% or less from saturated fats, daily to have 5 or more servings of fruits and vegetables.  Biometrics:  Pre Biometrics - 02/27/24 1547       Pre Biometrics   Height 5' 3.58 (1.615 m)    Weight 70.5 kg    Waist Circumference 37.5 inches    Hip Circumference  42 inches    Waist to Hip Ratio 0.89 %    BMI (Calculated) 27.04    Single Leg Stand 5.3 seconds          Post Biometrics - 07/04/24 0910        Post  Biometrics   Height 5' 3.58 (1.615 m)    BMI (Calculated) 25.53          Nutrition Therapy Plan and Nutrition Goals:  Nutrition Therapy & Goals - 02/27/24 1616       Nutrition Therapy   Diet Mediterranean    Protein (specify units) 70-90g    Fiber 25 grams    Whole Grain Foods 3 servings    Saturated Fats 15 max. grams    Fruits and Vegetables 5 servings/day    Sodium 2 grams      Personal Nutrition Goals   Nutrition Goal Eat 15-30gProtein and 30-60gCarbs at each meal.    Personal Goal #2 Read labels and reduce sodium intake to below 2300mg . Ideally 1500mg  per day.    Comments  Patient drinking mostly water , sometimes will have unsweetened tea. Has a glass or wine with dinner sometimes. She reports she has been following a mediterranean diet for years now. She tries to limit or avoid processed foods when possible. She says sometimes she skips lunch but then is very hungry come dinner time. Spoke with her about not skipping meal and instead making small meals or snacks that have high nutritional quality. Brainstormed several ideas together focusing on pairing complex carbs with protein or healthy fats. She likes fruits and veggies eating them often.      Intervention Plan   Intervention Prescribe, educate and counsel regarding individualized specific dietary modifications aiming towards targeted core components such as weight, hypertension, lipid management, diabetes, heart failure and other comorbidities.;Nutrition handout(s) given to patient.    Expected Outcomes Short Term Goal: Understand basic principles of dietary content, such as calories, fat, sodium, cholesterol and nutrients.;Short Term Goal: A plan has been developed with personal nutrition goals set during dietitian appointment.;Long Term Goal: Adherence to prescribed nutrition plan.          Nutrition Assessments:  MEDIFICTS Score Key: >=70 Need to make dietary changes  40-70 Heart Healthy Diet <= 40 Therapeutic Level Cholesterol Diet   Picture Your Plate Scores: <59 Unhealthy dietary pattern with much room for improvement. 41-50 Dietary pattern unlikely to meet recommendations for good health and room for improvement. 51-60 More healthful dietary pattern, with some room for improvement.  >60 Healthy dietary pattern, although there may be some specific behaviors that could be improved.    Nutrition Goals Re-Evaluation:  Nutrition Goals Re-Evaluation     Row Name 05/19/24 1614 06/30/24 1603           Goals   Comment Patient was informed on why it is important to maintain a balanced diet when  dealing with Respiratory issues. Explained that it takes a lot of energy to breath and when they are short of breath often they will need to have a good diet to help keep up with the calories they are expending for breathing. Spoke with Heron about meeting protein needs, keeping saturated fat controlled. She is reading labels and likes the way she has been eating for ~17years.      Expected Outcome Short: Choose and plan snacks accordingly to patients caloric intake to improve breathing. Long: Maintain a diet independently that meets their caloric intake to aid in daily shortness of  breath. STG: continue to eat healthy and meet protien needs. LTG: Maintain a diet independently that meets their caloric intake to aid in daily shortness of breath.         Nutrition Goals Discharge (Final Nutrition Goals Re-Evaluation):  Nutrition Goals Re-Evaluation - 06/30/24 1603       Goals   Comment Spoke with Heron about meeting protein needs, keeping saturated fat controlled. She is reading labels and likes the way she has been eating for ~17years.    Expected Outcome STG: continue to eat healthy and meet protien needs. LTG: Maintain a diet independently that meets their caloric intake to aid in daily shortness of breath.          Psychosocial: Target Goals: Acknowledge presence or absence of significant depression and/or stress, maximize coping skills, provide positive support system. Participant is able to verbalize types and ability to use techniques and skills needed for reducing stress and depression.  Initial Review & Psychosocial Screening:  Initial Psych Review & Screening - 07/04/24 0915       Initial Review   Current issues with None Identified      Family Dynamics   Good Support System? Yes    Comments 2 children and grandchildren      Barriers   Psychosocial barriers to participate in program There are no identifiable barriers or psychosocial needs.      Screening Interventions    Interventions Encouraged to exercise          Quality of Life Scores:  Scores of 19 and below usually indicate a poorer quality of life in these areas.  A difference of  2-3 points is a clinically meaningful difference.  A difference of 2-3 points in the total score of the Quality of Life Index has been associated with significant improvement in overall quality of life, self-image, physical symptoms, and general health in studies assessing change in quality of life.  PHQ-9: Review Flowsheet  More data exists      07/04/2024 05/19/2024 02/27/2024 12/25/2018 12/26/2017  Depression screen PHQ 2/9  Decreased Interest 0 0 2 0 0  Down, Depressed, Hopeless 0 0 0 0 0  PHQ - 2 Score 0 0 2 0 0  Altered sleeping 1 1 1  - 0  Tired, decreased energy 0 1 2 - 1  Change in appetite 0 0 0 - 0  Feeling bad or failure about yourself  0 0 0 - 0  Trouble concentrating 0 0 0 - 0  Moving slowly or fidgety/restless 0 0 0 - 0  Suicidal thoughts 0 0 0 - 0  PHQ-9 Score 1 2 5  - 1  Difficult doing work/chores Not difficult at all Somewhat difficult Somewhat difficult - Not difficult at all   Interpretation of Total Score  Total Score Depression Severity:  1-4 = Minimal depression, 5-9 = Mild depression, 10-14 = Moderate depression, 15-19 = Moderately severe depression, 20-27 = Severe depression   Psychosocial Evaluation and Intervention:  Psychosocial Evaluation - 07/04/24 0916       Psychosocial Evaluation & Interventions   Interventions Encouraged to exercise with the program and follow exercise prescription    Comments THere are no barriers to attending the program.    Expected Outcomes For Tavon to participate in rehab free of psychosocial concerns.    Continue Psychosocial Services  Follow up required by staff          Psychosocial Re-Evaluation:  Psychosocial Re-Evaluation     Row Name 05/19/24 1615  07/09/24 1611 07/11/24 1125 07/11/24 1126 08/04/24 0921     Psychosocial Re-Evaluation    Current issues with -- None Identified -- None Identified None Identified   Comments Reviewed patient health questionnaire (PHQ-9) with patient for follow up. Previously, patients score indicated signs/symptoms of depression.  Reviewed to see if patient is improving symptom wise while in program.  Score improved and patient states that it is because she has been able to exercise and do more. Psychosocial monthly re-evaluation is as follows: Eleora came in for orientation on 07/04/24. She scored a 0/1 on her PHQ 2-9. She denies any psychosocial barriers or concerns. She states she has good support from her children and grandchildren. She declines any referrals or additional resources. -- Psychosocial monthly re-evaluation is as follows: Avereigh came in for orientation on 07/04/24. She scored a 0/1 on her PHQ 2-9. She denies any psychosocial barriers or concerns. She states she has good support from her children and grandchildren. She declines any referrals or additional resources. Psychosocial monthly re-evaluation is as follows: Marquesha denies any psychosocial barriers or concerns. She states she has good support from her children and grandchildren. She declines any referrals or additional resources.   Expected Outcomes Short: Continue to attend [name] regularly for regular exercise and social engagement. Long: Continue to improve symptoms and manage a positive mental state. For Ziah to participate in rehab free of psychosocial concerns. -- For Adriana to participate in rehab free of psychosocial concerns. For Lariyah to participate in rehab free of psychosocial concerns.   Interventions Encouraged to attend Pulmonary Rehabilitation for the exercise Encouraged to attend Pulmonary Rehabilitation for the exercise -- Encouraged to attend Pulmonary Rehabilitation for the exercise Encouraged to attend Pulmonary Rehabilitation for the exercise   Continue Psychosocial Services  Follow up required by staff No  Follow up required -- No Follow up required No Follow up required      Psychosocial Discharge (Final Psychosocial Re-Evaluation):  Psychosocial Re-Evaluation - 08/04/24 0921       Psychosocial Re-Evaluation   Current issues with None Identified    Comments Psychosocial monthly re-evaluation is as follows: Jazelle denies any psychosocial barriers or concerns. She states she has good support from her children and grandchildren. She declines any referrals or additional resources.    Expected Outcomes For Marimar to participate in rehab free of psychosocial concerns.    Interventions Encouraged to attend Pulmonary Rehabilitation for the exercise    Continue Psychosocial Services  No Follow up required          Education: Education Goals: Education classes will be provided on a weekly basis, covering required topics. Participant will state understanding/return demonstration of topics presented.  Learning Barriers/Preferences:  Learning Barriers/Preferences - 07/04/24 0917       Learning Barriers/Preferences   Learning Barriers Hearing   has hearing aids   Learning Preferences None          Education Topics: Know Your Numbers Group instruction that is supported by a PowerPoint presentation. Instructor discusses importance of knowing and understanding resting, exercise, and post-exercise oxygen  saturation, heart rate, and blood pressure. Oxygen  saturation, heart rate, blood pressure, rating of perceived exertion, and dyspnea are reviewed along with a normal range for these values.  Flowsheet Row PULMONARY REHAB OTHER RESPIRATORY from 07/31/2024 in [facility]  Date 07/31/24  Educator EP  Instruction Review Code 1- Verbalizes Understanding    Exercise for the Pulmonary Patient Group instruction that is  supported by a PowerPoint presentation. Instructor discusses benefits of exercise, core components of exercise, frequency,  duration, and intensity of an exercise routine, importance of utilizing pulse oximetry during exercise, safety while exercising, and options of places to exercise outside of rehab.  Flowsheet Row PULMONARY REHAB OTHER RESPIRATORY from 07/24/2024 in [facility]  Date 07/24/24  Educator EP  Instruction Review Code 1- Verbalizes Understanding    MET Level  Group instruction provided by PowerPoint, verbal discussion, and written material to support subject matter. Instructor reviews what METs are and how to increase METs.    Pulmonary Medications Verbally interactive group education provided by instructor with focus on inhaled medications and proper administration. Flowsheet Row PULMONARY REHAB OTHER RESPIRATORY from 07/17/2024 in [facility]  Date 07/17/24  Educator RT  Instruction Review Code 1- Verbalizes Understanding    Anatomy and Physiology of the Respiratory System Group instruction provided by PowerPoint, verbal discussion, and written material to support subject matter. Instructor reviews respiratory cycle and anatomical components of the respiratory system and their functions. Instructor also reviews differences in obstructive and restrictive respiratory diseases with examples of each.  Flowsheet Row PULMONARY REHAB OTHER RESPIRATORY from 07/10/2024 in [facility]  Date 07/10/24  Educator RT  Instruction Review Code 1- Verbalizes Understanding    Oxygen  Safety Group instruction provided by PowerPoint, verbal discussion, and written material to support subject matter. There is an overview of "What is Oxygen " and "Why do we need it".  Instructor also reviews how to create a safe environment for oxygen  use, the importance of using oxygen  as prescribed, and the risks of noncompliance. There is a brief discussion on  traveling with oxygen  and resources the patient may utilize. Flowsheet Row PULMONARY REHAB OTHER RESPIRATORY from 08/07/2024 in [facility] for Heart, Vascular, & Lung Health  Date 08/07/24  Educator RN  Instruction Review Code 1- Verbalizes Understanding    Oxygen  Use Group instruction provided by PowerPoint, verbal discussion, and written material to discuss how supplemental oxygen  is prescribed and different types of oxygen  supply systems. Resources for more information are provided.    Breathing Techniques Group instruction that is supported by demonstration and informational handouts. Instructor discusses the benefits of pursed lip and diaphragmatic breathing and detailed demonstration on how to perform both.     Risk Factor Reduction Group instruction that is supported by a PowerPoint presentation. Instructor discusses the definition of a risk factor, different risk factors for pulmonary disease, and how the heart and lungs work together.   Pulmonary Diseases Group instruction provided by PowerPoint, verbal discussion, and written material to support subject matter. Instructor gives an overview of the different type of pulmonary diseases. There is also a discussion on risk factors and symptoms as well as ways to manage the diseases.   Stress and Energy Conservation Group instruction provided by PowerPoint, verbal discussion, and written material to support subject matter. Instructor gives an overview of stress and the impact it can have on the body. Instructor also reviews ways to reduce stress. There is also a discussion on energy conservation and ways to conserve energy throughout the day.   Warning Signs and Symptoms Group instruction provided by PowerPoint, verbal discussion, and written material to support subject matter. Instructor reviews warning signs and symptoms of stroke, heart attack, cold and flu. Instructor also reviews ways  to prevent the spread  of infection.   Other Education Group or individual verbal, written, or video instructions that support the educational goals of the pulmonary rehab program. Flowsheet Row Pulmonary Rehab from 01/02/2018 in [facility] Cardiac and Pulmonary Rehab  Date 12/05/17  Educator [facility]  Instruction Review Code 1- Verbalizes Understanding  [SLEEP]     Knowledge Questionnaire Score:  Knowledge Questionnaire Score - 07/04/24 9047       Knowledge Questionnaire Score   Pre Score 18/18          Core Components/Risk Factors/Patient Goals at Admission:  Personal Goals and Risk Factors at Admission - 07/04/24 0917       Core Components/Risk Factors/Patient Goals on Admission   Admit Weight --    Goal Weight: Short Term --    Goal Weight: Long Term --    Improve shortness of breath with ADL's Yes    Intervention Provide education, individualized exercise plan and daily activity instruction to help decrease symptoms of SOB with activities of daily living.    Expected Outcomes Short Term: Improve cardiorespiratory fitness to achieve a reduction of symptoms when performing ADLs;Long Term: Be able to perform more ADLs without symptoms or delay the onset of symptoms    Expected Outcomes --          Core Components/Risk Factors/Patient Goals Review:   Goals and Risk Factor Review     Row Name 05/19/24 1613 07/09/24 1610 08/04/24 0922         Core Components/Risk Factors/Patient Goals Review   Personal Goals Review Improve shortness of breath with ADL's Improve shortness of breath with ADL's;Develop more efficient breathing techniques such as purse lipped breathing and diaphragmatic breathing and practicing self-pacing with activity. Improve shortness of breath with ADL's;Develop more efficient breathing techniques such as purse lipped breathing and diaphragmatic breathing and practicing self-pacing with activity.     Review Spoke to patient about their shortness of breath and what they can do to improve.  Patient has been informed of breathing techniques when starting the program. Patient is informed to tell staff if they have had any med changes and that certain meds they are taking or not taking can be causing shortness of breath. Monthly review of patient's Core Components/Risk Factors/Patient Goals are as follows: Unable to assess goals. Aubriee hasn't started the program yet. She is scheduled to start tomorrow, 07/10/24. Monthly review of patient's Core Components/Risk Factors/Patient Goals are as follows: Goal in progress for improving her shortness of breath with ADLs. Hayli is trying to build up her strength and endurance. She completed the [name] Program at [place] and continued to the [facility] last month. She is progressing on Nustep and treadmill, increasing her workload, METs, speed and incline. She is stable on room air with exertion. Goal met for developing more efficient breathing techniques such as purse lipped breathing and diaphragmatic breathing; and practicing self-pacing with activity. Leiana can perform purse lipped breathing while short of breath. She demonstrated this while performing the warmup and while exercising. She can initiate PLB on her own. She works on diaphragmatic breathing at home. Mica will continue to benefit from PR for nutrition, education, exercise, and lifestyle modification.     Expected Outcomes Short: Attend [name] regularly to improve shortness of breath with ADL's. Long: maintain independence with ADL's Pt will show progress toward meeting expected goals and outcomes. Pt will show progress toward meeting expected goals and outcomes.        Core Components/Risk Factors/Patient  Goals at Discharge (Final Review):   Goals and Risk Factor Review - 08/04/24 0922       Core Components/Risk Factors/Patient Goals Review   Personal Goals Review Improve shortness of breath with ADL's;Develop more efficient breathing techniques such as purse lipped  breathing and diaphragmatic breathing and practicing self-pacing with activity.    Review Monthly review of patient's Core Components/Risk Factors/Patient Goals are as follows: Goal in progress for improving her shortness of breath with ADLs. Derry is trying to build up her strength and endurance. She completed the [name] Program at [place] and continued to the [facility] last month. She is progressing on Nustep and treadmill, increasing her workload, METs, speed and incline. She is stable on room air with exertion. Goal met for developing more efficient breathing techniques such as purse lipped breathing and diaphragmatic breathing; and practicing self-pacing with activity. Durinda can perform purse lipped breathing while short of breath. She demonstrated this while performing the warmup and while exercising. She can initiate PLB on her own. She works on diaphragmatic breathing at home. Jonelle will continue to benefit from PR for nutrition, education, exercise, and lifestyle modification.    Expected Outcomes Pt will show progress toward meeting expected goals and outcomes.          ITP Comments:  ITP Comments     Row Name 02/21/24 1631 02/27/24 1539 03/03/24 1600 03/19/24 1305 04/16/24 0938   ITP Comments Virtual orientation call completed today. shehas an appointment on Date: 02/27/2024  for EP eval and gym Orientation.  Documentation of diagnosis can be found in [facility] 10/09/2023 . Completed and gym orientation for respiratory care services. Initial ITP created and sent for review to Dr. Faud Aleskerov, Medical Director. First full day of exercise!  Patient was oriented to gym and equipment including functions, settings, policies, and procedures.  Patient's individual exercise prescription and treatment plan were reviewed.  All starting workloads were established based on the results of the 6 minute walk test done at initial orientation visit.  The plan for exercise progression was  also introduced and progression will be customized based on patient's performance and goals. 30 Day review completed. Medical Director ITP review done, changes made as directed, and signed approval by Medical Director.    new to program 30 Day review completed. Medical Director ITP review done, changes made as directed, and signed approval by Medical Director.    Row Name 05/14/24 0817 06/11/24 0802 07/03/24 1430       ITP Comments 30 Day review completed. Medical Director ITP review done, changes made as directed, and signed approval by Medical Director. 30 Day review completed. Medical Director ITP review done, changes made as directed, and signed approval by Medical Director. Patient requesting to be discharged from the program at this time. She completed 26 of 36 sessions.        Comments: Pt is making expected progress toward Pulmonary Rehab goals after completing 10 session(s). Recommend continued exercise, life style modification, education, and utilization of breathing techniques to increase stamina and strength, while also decreasing shortness of breath with exertion.  Dr. Slater Staff is Medical Director for Pulmonary Rehab at [facility].

## 2024-08-14 ENCOUNTER — Encounter (HOSPITAL_COMMUNITY)
Admission: RE | Admit: 2024-08-14 | Discharge: 2024-08-14 | Disposition: A | Discharge status: Home / Self Care | Source: Ambulatory Visit | Attending: Pulmonary Disease | Admitting: Pulmonary Disease

## 2024-08-14 DIAGNOSIS — I272 Pulmonary hypertension, unspecified: Secondary | ICD-10-CM | POA: Diagnosis not present

## 2024-08-14 NOTE — Progress Notes (Signed)
 Daily Session Note  Patient Details  Name: YUNA PIZZOLATO MRN: [id number] Date of Birth: 07/06/1946 Referring Provider:   [name] Pulmonary Rehab Walk Test from 07/04/2024 in [facility] for Heart, Vascular, & Lung Health  Referring Provider [name]    Encounter Date: 08/14/2024  Check In:  Session Check In - 08/14/24 1331       Check-In   Supervising physician immediately available to respond to emergencies [facility] MD immediately available    Physician(s) Jackee Wyn Raddle, NP    Location [facility] Re[facility]    Staff Present Ronal Levin, RN, BSN;Halee Glynn Claudene Neita Moats, MS, ACSM-CEP, Exercise Physiologist    Virtual Visit No    Medication changes reported     No    Fall or balance concerns reported    No    Tobacco Cessation No Change    Warm-up and Cool-down Performed as group-led instruction    Resistance Training Performed Yes    VAD Patient? No    PAD/SET Patient? No      VAD patient   Has back up controller? No      Pain Assessment   Currently in Pain? No/denies    Multiple Pain Sites No          Capillary Blood Glucose: No results found. However, due to the size of the patient record, not all encounters were searched. Please check Results Review for a complete set of results.    Social History   Tobacco Use  Smoking Status Former   Current packs/day: 0.00   Average packs/day: 1 pack/day for 40.0 years (40.0 ttl pk-yrs)   Types: Cigarettes   Start date: 09/15/1967   Quit date: 09/15/2007   Years since quitting: 16.9  Smokeless Tobacco Never  Tobacco Comments   Former smoker Quit 2008    Goals Met:  Proper associated with RPD/PD & O2 Sat Independence with exercise equipment Exercise tolerated well No report of concerns or symptoms today Strength training completed today  Goals Unmet:  Not Applicable  Comments: Service time is from 1308 to 1447.    Dr. Slater Staff is Medical Director for Pulmonary  Rehab at [facility].

## 2024-08-15 ENCOUNTER — Ambulatory Visit

## 2024-08-18 ENCOUNTER — Ambulatory Visit

## 2024-08-19 ENCOUNTER — Encounter (HOSPITAL_COMMUNITY)
Admission: RE | Admit: 2024-08-19 | Discharge: 2024-08-19 | Disposition: A | Discharge status: Home / Self Care | Source: Ambulatory Visit | Attending: Pulmonary Disease | Admitting: Pulmonary Disease

## 2024-08-19 VITALS — Wt 147.7 lb

## 2024-08-19 DIAGNOSIS — I272 Pulmonary hypertension, unspecified: Secondary | ICD-10-CM | POA: Diagnosis not present

## 2024-08-19 DIAGNOSIS — C9110 Chronic lymphocytic leukemia of B-cell type not having achieved remission: Secondary | ICD-10-CM | POA: Diagnosis not present

## 2024-08-19 NOTE — Progress Notes (Signed)
 Daily Session Note  Patient Details  Name: MAKAIYA GEERDES MRN: [id number] Date of Birth: 1945-12-10 Referring Provider:   [name] Pulmonary Rehab Walk Test from 07/04/2024 in [facility] for Heart, Vascular, & Lung Health  Referring Provider [name]    Encounter Date: 08/19/2024  Check In:  Session Check In - 08/19/24 1124       Check-In   Supervising physician immediately available to respond to emergencies [facility] MD immediately available    Physician(s) Barnie Hila, NP    Location [facility]    Staff Present Ronal Levin, RN, BSN;Casey Claudene, Neita Moats, MS, ACSM-CEP, Exercise Physiologist;Joseph Lennon, RN, BSN    Virtual Visit No    Medication changes reported     No    Fall or balance concerns reported    No    Tobacco Cessation No Change    Warm-up and Cool-down Performed as group-led instruction    Resistance Training Performed Yes    VAD Patient? No    PAD/SET Patient? No      VAD patient   Has back up controller? No      Pain Assessment   Currently in Pain? No/denies    Multiple Pain Sites No          Capillary Blood Glucose: No results found. However, due to the size of the patient record, not all encounters were searched. Please check Results Review for a complete set of results.    Social History   Tobacco Use  Smoking Status Former   Current packs/day: 0.00   Average packs/day: 1 pack/day for 40.0 years (40.0 ttl pk-yrs)   Types: Cigarettes   Start date: 09/15/1967   Quit date: 09/15/2007   Years since quitting: 16.9  Smokeless Tobacco Never  Tobacco Comments   Former smoker Quit 2008    Goals Met:  Independence with exercise equipment Exercise tolerated well No report of concerns or symptoms today Strength training completed today  Goals Unmet:  Not Applicable  Comments: Service time is from 1018 to 1133    Dr. Slater Staff is Medical Director for Pulmonary Rehab at [facility].

## 2024-08-20 ENCOUNTER — Ambulatory Visit

## 2024-08-21 ENCOUNTER — Encounter (HOSPITAL_COMMUNITY): Admission: RE | Admit: 2024-08-21 | Source: Ambulatory Visit

## 2024-08-21 ENCOUNTER — Telehealth (HOSPITAL_COMMUNITY): Payer: Self-pay

## 2024-08-21 NOTE — Telephone Encounter (Signed)
 Spoke to pt about missing Pulm Rehab class today. Pt stated she has been ill and is still fatigued.

## 2024-08-26 ENCOUNTER — Telehealth (HOSPITAL_COMMUNITY): Payer: Self-pay

## 2024-08-26 ENCOUNTER — Encounter (HOSPITAL_COMMUNITY): Admission: RE | Admit: 2024-08-26 | Source: Ambulatory Visit

## 2024-08-26 NOTE — Telephone Encounter (Signed)
 Patient c/o for 1:15 PR class, no reason given.

## 2024-08-28 ENCOUNTER — Telehealth (HOSPITAL_COMMUNITY): Payer: Self-pay

## 2024-08-28 ENCOUNTER — Encounter (HOSPITAL_COMMUNITY)

## 2024-08-28 NOTE — Telephone Encounter (Signed)
 Patient c/o for 1:15 PR class at 1:33pm, states she's feeling better and will be in Tuesday.

## 2024-08-29 ENCOUNTER — Encounter: Payer: Self-pay | Admitting: Cardiology

## 2024-08-29 ENCOUNTER — Ambulatory Visit: Attending: Cardiology | Admitting: Cardiology

## 2024-08-29 ENCOUNTER — Ambulatory Visit

## 2024-08-29 VITALS — BP 129/69 | HR 69 | Ht 63.0 in | Wt 148.6 lb

## 2024-08-29 DIAGNOSIS — D6869 Other thrombophilia: Secondary | ICD-10-CM | POA: Diagnosis not present

## 2024-08-29 DIAGNOSIS — I480 Paroxysmal atrial fibrillation: Secondary | ICD-10-CM | POA: Insufficient documentation

## 2024-08-29 DIAGNOSIS — I484 Atypical atrial flutter: Secondary | ICD-10-CM | POA: Diagnosis not present

## 2024-08-29 DIAGNOSIS — I483 Typical atrial flutter: Secondary | ICD-10-CM | POA: Diagnosis not present

## 2024-08-29 DIAGNOSIS — I2510 Atherosclerotic heart disease of native coronary artery without angina pectoris: Secondary | ICD-10-CM | POA: Insufficient documentation

## 2024-08-29 DIAGNOSIS — I10 Essential (primary) hypertension: Secondary | ICD-10-CM | POA: Insufficient documentation

## 2024-08-29 NOTE — Patient Instructions (Signed)
 Medication Instructions:  Your physician recommends that you continue on your current medications as directed. Please refer to the Current Medication list given to you today.  *If you need a refill on your cardiac medications before your next appointment, please call your pharmacy*   Testing/Procedures: Event Monitor  Your physician has recommended that you wear an event monitor. Event monitors are medical devices that record the heart's electrical activity. Doctors most often us  these monitors to diagnose arrhythmias. Arrhythmias are problems with the speed or rhythm of the heartbeat. The monitor is a small, portable device. You can wear one while you do your normal daily activities. This is usually used to diagnose what is causing palpitations/syncope (passing out).  Follow-Up: At [facility], you and your health needs are our priority.  As part of our continuing mission to provide you with exceptional heart care, our providers are all part of one team.  This team includes your primary Cardiologist (physician) and Advanced Practice Providers or APPs (Physician Assistants and Nurse Practitioners) who all work together to provide you with the care you need, when you need it.  Your next appointment:   1 year  Provider:   You will follow up in the Atrial Fibrillation Clinic located at [facility]. Your provider will be: Clint R. Fenton, PA-C or Fairy Heinrich, PA-C {  ZIO XT- Long Term Monitor Instructions  Your physician has requested you wear a ZIO patch monitor for 14 days.  This is a single patch monitor. [employer] supplies one patch monitor per enrollment. Additional stickers are not available. Please do not apply patch if you will be having a Nuclear Stress Test,  Echocardiogram, Cardiac CT, MRI, or Chest Xray during the period you would be wearing the  monitor. The patch cannot be worn during these tests. You cannot remove and re-apply the  ZIO XT patch monitor.  Your  ZIO patch monitor will be mailed 3 day USPS to your address on file. It may take 3-5 days  to receive your monitor after you have been enrolled.  Once you have received your monitor, please review the enclosed instructions. Your monitor  has already been registered assigning a specific monitor serial # to you.  Billing and Patient Assistance Program Information  We have supplied [employer] with any of your insurance information on file for billing purposes. [employer] offers a sliding scale Patient Assistance Program for patients that do not have  insurance, or whose insurance does not completely cover the cost of the ZIO monitor.  You must apply for the Patient Assistance Program to qualify for this discounted rate.  To apply, please call [employer] at [phone], select option 4, select option 2, ask to apply for  Patient Assistance Program. Meredeth will ask your household income, and how many people  are in your household. They will quote your out-of-pocket cost based on that information.  [employer] will also be able to set up a 73-month, interest-free payment plan if needed.  Applying the monitor   Shave hair from upper left chest.  Hold abrader disc by orange tab. Rub abrader in 40 strokes over the upper left chest as  indicated in your monitor instructions.  Clean area with 4 enclosed alcohol  pads. Let dry.  Apply patch as indicated in monitor instructions. Patch will be placed under collarbone on left  side of chest with arrow pointing upward.  Rub patch adhesive wings for 2 minutes. Remove white label marked 1. Remove the white  label marked  2. Rub patch adhesive wings for 2 additional minutes.  While looking in a mirror, press and release button in center of patch. A small green light will  flash 3-4 times. This will be your only indicator that the monitor has been turned on.  Do not shower for the first 24 hours. You may shower after the first 24 hours.  Press the button if you feel  a symptom. You will hear a small click. Record Date, Time and  Symptom in the Patient Logbook.  When you are ready to remove the patch, follow instructions on the last 2 pages of Patient  Logbook. Stick patch monitor onto the last page of Patient Logbook.  Place Patient Logbook in the blue and white box. Use locking tab on box and tape box closed  securely. The blue and white box has prepaid postage on it. Please place it in the mailbox as  soon as possible. Your physician should have your test results approximately 7 days after the  monitor has been mailed back to [facility].  Call [facility] Customer Care at [phone] if you have questions regarding  your ZIO XT patch monitor. Call them immediately if you see an orange light blinking on your  monitor.  If your monitor falls off in less than 4 days, contact our Monitor department at [phone].  If your monitor becomes loose or falls off after 4 days call [employer] at [phone] for  suggestions on securing your monitor

## 2024-08-29 NOTE — Progress Notes (Signed)
  Electrophysiology Office Note:   Date:  08/29/2024  ID:  [name], [name] [birth year], MRN [id number]  Primary Cardiologist: Deatrice Cage, MD Primary Heart Failure: None Electrophysiologist: Elliona Doddridge Gladis Norton, MD      History of Present Illness:   [name] is a 78 y.o. female with h/o atrial fibrillation/flutter, coronary artery disease post CABG, COPD, CLL, lung cancer postresection, PAD seen today for routine electrophysiology followup.   Since last being seen in our clinic the patient reports doing for the most part well.  She has noted no further episodes of atrial fibrillation or flutter.  She does have intermittent palpitations where she feels like she has skipped beats.  Aside from that, she has no acute complaints.  she denies chest pain, dyspnea, PND, orthopnea, nausea, vomiting, dizziness, syncope, edema, weight gain, or early satiety.   Review of systems complete and found to be negative unless listed in HPI.   EP Information / Studies Reviewed:    EKG is not ordered today. EKG from 07/29/2024 reviewed which showed sinus rhythm      Risk Assessment/Calculations:    CHA2DS2-VASc Score = 5   This indicates a 7.2% annual risk of stroke. The patient's score is based upon: CHF History: 0 HTN History: 1 Diabetes History: 0 Stroke History: 0 Vascular Disease History: 1 Age Score: 2 Gender Score: 1             Physical Exam:   VS:  BP 129/69 (BP Location: Left Arm, Patient Position: Sitting, Cuff Size: Normal)   Pulse 69   Ht 5' 3 (1.6 m)   Wt 148 lb 9.6 oz (67.4 kg)   SpO2 96%   BMI 26.32 kg/m    Wt Readings from Last 3 Encounters:  08/29/24 148 lb 9.6 oz (67.4 kg)  08/26/24 147 lb 11.3 oz (67 kg)  08/12/24 147 lb 0.8 oz (66.7 kg)     GEN: Well nourished, well developed in no acute distress NECK: No JVD; No carotid bruits CARDIAC: Regular rate and rhythm, no murmurs, rubs, gallops RESPIRATORY:  Clear to auscultation without rales, wheezing or  rhonchi  ABDOMEN: Soft, non-tender, non-distended EXTREMITIES:  No edema; No deformity   ASSESSMENT AND PLAN:    1.  Paroxysmal atrial fibrillation/typical and atypical atrial flutter: Post ablation 02/09/2022.  She is having recurrent palpitations.  She does not feel this is due to atrial fibrillation.  Potentially due to PACs or PVCs.  Nannette Zill plan for 2-week monitor.  2.  Secondary hypercoagulable date: On warfarin  3.  Coronary artery disease: Post CABG in 2008.  No chest pain.  4.  Hypertension: Well-controlled  Follow up with Afib Clinic in 12 months  Signed, Alliyah Roesler Gladis Norton, MD

## 2024-08-29 NOTE — Progress Notes (Unsigned)
 Enrolled patient for a 14 day Zio XT  monitor to be mailed to patients home

## 2024-09-01 DIAGNOSIS — Z79899 Other long term (current) drug therapy: Secondary | ICD-10-CM | POA: Diagnosis not present

## 2024-09-01 DIAGNOSIS — R5383 Other fatigue: Secondary | ICD-10-CM | POA: Diagnosis not present

## 2024-09-01 DIAGNOSIS — Z23 Encounter for immunization: Secondary | ICD-10-CM | POA: Diagnosis not present

## 2024-09-01 DIAGNOSIS — I21A9 Other myocardial infarction type: Secondary | ICD-10-CM | POA: Diagnosis not present

## 2024-09-01 DIAGNOSIS — E785 Hyperlipidemia, unspecified: Secondary | ICD-10-CM | POA: Diagnosis not present

## 2024-09-01 DIAGNOSIS — I2510 Atherosclerotic heart disease of native coronary artery without angina pectoris: Secondary | ICD-10-CM | POA: Diagnosis not present

## 2024-09-01 DIAGNOSIS — E876 Hypokalemia: Secondary | ICD-10-CM | POA: Diagnosis not present

## 2024-09-01 DIAGNOSIS — E559 Vitamin D deficiency, unspecified: Secondary | ICD-10-CM | POA: Diagnosis not present

## 2024-09-02 ENCOUNTER — Encounter (HOSPITAL_COMMUNITY)
Admission: RE | Admit: 2024-09-02 | Discharge: 2024-09-02 | Disposition: A | Source: Ambulatory Visit | Attending: Pulmonary Disease | Admitting: Pulmonary Disease

## 2024-09-02 ENCOUNTER — Encounter: Payer: Self-pay | Admitting: Cardiovascular Disease

## 2024-09-02 DIAGNOSIS — I272 Pulmonary hypertension, unspecified: Secondary | ICD-10-CM

## 2024-09-02 NOTE — Progress Notes (Signed)
 Daily Session Note  Patient Details  Name: [name] MRN: [id number] Date of Birth: 04/22/1946 Referring Provider:   [name] Pulmonary Rehab Walk Test from 07/04/2024 in [facility] for Heart, Vascular, & Lung Health  Referring Provider [name]    Encounter Date: 09/02/2024  Check In:  Session Check In - 09/02/24 1421       Check-In   Supervising physician immediately available to respond to emergencies [facility] MD immediately available    Physician(s) Rosabel Mose, NP    Location [facility]    Staff Present Ronal Levin, RN, BSN;Icelyn Navarrete Claudene, RT;Randi [place] BS, ACSM-CEP, Exercise Physiologist    Virtual Visit No    Medication changes reported     No    Fall or balance concerns reported    No    Tobacco Cessation No Change    Warm-up and Cool-down Performed as group-led instruction    Resistance Training Performed Yes    VAD Patient? No    PAD/SET Patient? No      VAD patient   Has back up controller? No      Pain Assessment   Currently in Pain? No/denies    Multiple Pain Sites No          Capillary Blood Glucose: No results found. However, due to the size of the patient record, not all encounters were searched. Please check Results Review for a complete set of results.    Social History   Tobacco Use  Smoking Status Former   Current packs/day: 0.00   Average packs/day: 1 pack/day for 40.0 years (40.0 ttl pk-yrs)   Types: Cigarettes   Start date: 09/15/1967   Quit date: 09/15/2007   Years since quitting: 16.9  Smokeless Tobacco Never  Tobacco Comments   Former smoker Quit 2008    Goals Met:  Proper associated with RPD/PD & O2 Sat Independence with exercise equipment Exercise tolerated well No report of concerns or symptoms today Strength training completed today  Goals Unmet:  Not Applicable  Comments: Service time is from 1311 to 1435.    Dr. Slater Staff is Medical Director for Pulmonary Rehab at  [facility].

## 2024-09-04 ENCOUNTER — Encounter (HOSPITAL_COMMUNITY)
Admission: RE | Admit: 2024-09-04 | Discharge: 2024-09-04 | Disposition: A | Discharge status: Home / Self Care | Source: Ambulatory Visit | Attending: Pulmonary Disease | Admitting: Pulmonary Disease

## 2024-09-04 DIAGNOSIS — I272 Pulmonary hypertension, unspecified: Secondary | ICD-10-CM | POA: Diagnosis not present

## 2024-09-04 NOTE — Progress Notes (Signed)
 Daily Session Note  Patient Details  Name: [name] MRN: [id number] Date of Birth: [birth year] Referring Provider:   [name] [facility] Walk Test from 07/04/2024 in [facility]  Referring Provider [name]    Encounter Date: 09/04/2024  Check In:  Session Check In - 09/04/24 1343       Check-In   Supervising physician immediately available to respond to emergencies [facility] MD immediately available    Physician(s) Damien Braver, NP    Location [facility]    Staff Present Ronal Levin, RN, BSN;Casey Claudene, RT;Randi [facility] BS, ACSM-CEP, Exercise Physiologist;Kaylee Nicholaus, MS, ACSM-CEP, Exercise Physiologist    Virtual Visit No    Medication changes reported     No    Fall or balance concerns reported    No    Tobacco Cessation No Change    Warm-up and Cool-down Performed as group-led instruction    Resistance Training Performed Yes    VAD Patient? No    PAD/SET Patient? No      VAD patient   Has back up controller? No      Pain Assessment   Currently in Pain? No/denies    Multiple Pain Sites No          Capillary Blood Glucose: No results found. However, due to the size of the patient record, not all encounters were searched. Please check Results Review for a complete set of results.    Social History   Tobacco Use  Smoking Status Former   Current packs/day: 0.00   Average packs/day: 1 pack/day for 40.0 years (40.0 ttl pk-yrs)   Types: Cigarettes   Start date: 09/15/1967   Quit date: 09/15/2007   Years since quitting: 16.9  Smokeless Tobacco Never  Tobacco Comments   Former smoker Quit 2008    Goals Met:  Independence with exercise equipment Exercise tolerated well No report of concerns or symptoms today Strength training completed today  Goals Unmet:  Not Applicable  Comments: Service time is from 1320 to 1450    Dr. Slater Staff is Medical Director for Pulmonary  Rehab at [facility].

## 2024-09-05 ENCOUNTER — Encounter: Payer: Self-pay | Admitting: Cardiology

## 2024-09-05 NOTE — Telephone Encounter (Signed)
 Patient is following up requesting a call back regarding this matter.

## 2024-09-05 NOTE — Telephone Encounter (Signed)
 Pt aware monitor team will follow  up with her next week.  She is agreeable to plan.

## 2024-09-08 DIAGNOSIS — I10 Essential (primary) hypertension: Secondary | ICD-10-CM | POA: Diagnosis not present

## 2024-09-08 DIAGNOSIS — R7303 Prediabetes: Secondary | ICD-10-CM | POA: Diagnosis not present

## 2024-09-08 DIAGNOSIS — I480 Paroxysmal atrial fibrillation: Secondary | ICD-10-CM | POA: Diagnosis not present

## 2024-09-08 DIAGNOSIS — E785 Hyperlipidemia, unspecified: Secondary | ICD-10-CM | POA: Diagnosis not present

## 2024-09-08 DIAGNOSIS — C9110 Chronic lymphocytic leukemia of B-cell type not having achieved remission: Secondary | ICD-10-CM | POA: Diagnosis not present

## 2024-09-08 DIAGNOSIS — J449 Chronic obstructive pulmonary disease, unspecified: Secondary | ICD-10-CM | POA: Diagnosis not present

## 2024-09-08 DIAGNOSIS — I2510 Atherosclerotic heart disease of native coronary artery without angina pectoris: Secondary | ICD-10-CM | POA: Diagnosis not present

## 2024-09-09 ENCOUNTER — Encounter (HOSPITAL_COMMUNITY)
Admission: RE | Admit: 2024-09-09 | Discharge: 2024-09-09 | Disposition: A | Source: Ambulatory Visit | Attending: Pulmonary Disease | Admitting: Pulmonary Disease

## 2024-09-09 VITALS — Wt 147.7 lb

## 2024-09-09 DIAGNOSIS — I272 Pulmonary hypertension, unspecified: Secondary | ICD-10-CM

## 2024-09-09 NOTE — Addendum Note (Signed)
 Addended by: GRETEL MAEOLA CROME on: 09/09/2024 05:48 PM   Modules accepted: Orders

## 2024-09-09 NOTE — Progress Notes (Signed)
 Daily Session Note  Patient Details  Name: [name] MRN: [id number] Date of Birth: [birth year] Referring Provider:   [name] [facility] Rehab Walk Test from 07/04/2024 in [facility]  Referring Provider [name]    Encounter Date: 09/09/2024  Check In:  Session Check In - 09/09/24 1420       Check-In   Supervising physician immediately available to respond to emergencies [facility] MD immediately available    Physician(s) Lum Louis, NP    Location [facility]    Staff Present Ronal Levin, RN, BSN;Jakeira Seeman Claudene, RT;Randi Mancos BS, ACSM-CEP, Exercise Physiologist;Kaylee [place], MS, ACSM-CEP, Exercise Physiologist    Virtual Visit No    Medication changes reported     No    Fall or balance concerns reported    No    Tobacco Cessation No Change    Warm-up and Cool-down Performed as group-led instruction    Resistance Training Performed Yes    VAD Patient? No    PAD/SET Patient? No      VAD patient   Has back up controller? No      Pain Assessment   Currently in Pain? No/denies    Multiple Pain Sites No          Capillary Blood Glucose: No results found. However, due to the size of the patient record, not all encounters were searched. Please check Results Review for a complete set of results.   Exercise Prescription Changes - 09/09/24 1500       Response to Exercise   Blood Pressure (Admit) 122/78    Blood Pressure (Exercise) 138/70    Blood Pressure (Exit) 96/58    Heart Rate (Admit) 80 bpm    Heart Rate (Exercise) 84 bpm    Heart Rate (Exit) 86 bpm    Oxygen  Saturation (Admit) 99 %    Oxygen  Saturation (Exercise) 96 %    Oxygen  Saturation (Exit) 96 %    Rating of Perceived Exertion (Exercise) 16    Perceived Dyspnea (Exercise) 3    Duration Continue with 30 min of aerobic exercise without signs/symptoms of physical distress.    Intensity THRR unchanged      Progression    Progression Continue to progress workloads to maintain intensity without signs/symptoms of physical distress.      Resistance Training   Training Prescription Yes    Weight 3lb weight    Reps 10-15    Time 10 Minutes      Treadmill   MPH 2.7    Grade 0    Minutes 15    METs 2.9      NuStep   Level 1    Minutes 15    METs 3          Social History   Tobacco Use  Smoking Status Former   Current packs/day: 0.00   Average packs/day: 1 pack/day for 40.0 years (40.0 ttl pk-yrs)   Types: Cigarettes   Start date: 09/15/1967   Quit date: 09/15/2007   Years since quitting: 16.9  Smokeless Tobacco Never  Tobacco Comments   Former smoker Quit 2008    Goals Met:  Proper associated with RPD/PD & O2 Sat Independence with exercise equipment Exercise tolerated well No report of concerns or symptoms today Strength training completed today  Goals Unmet:  Not Applicable  Comments: Service time is from 1313 to 1436.    Dr. Slater Staff is Medical Director for  Pulmonary Rehab at [facility].

## 2024-09-09 NOTE — Telephone Encounter (Signed)
 Pt reports that pulmonary rehab is going to help her put it on, so she does not need a call from monitor team about this.  If something should change and she still needs help she will call office back.  She is appreciative of my follow up on this

## 2024-09-09 NOTE — Telephone Encounter (Signed)
 Pt made aware MD corrected his notes. Made aware I updated/corrected her  medication list. She thanks me for helping with this.

## 2024-09-10 ENCOUNTER — Encounter: Payer: Self-pay | Admitting: Pulmonary Disease

## 2024-09-10 ENCOUNTER — Telehealth: Payer: Self-pay

## 2024-09-10 NOTE — Progress Notes (Signed)
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 (RPE) scale;Knowledge and understanding of Target Heart Rate Range (THRR);Understanding of Exercise Prescription;Increase Strength and Stamina;Able to understand and use Dyspnea scale;Able to check pulse independently   Comments Tamyka is doing well in rehab. She maintained her workload on the treadmill with a speed of 2.3 mph and incline of 1%. She also maintained level 3 on the T4 nustep. We will continue to monitor her progress in the program. Mirely has not attended rehab since 4/30. We will attempt to reach out to determine her progress in the program. Kenyatta has not attended the program since 4/30. We have reached out and she states that she plans to return 6/23. Miakoda has not attended the program since 4/30. She previously stated that she planned to return to the program on 6/23, but has not returned to the program. We will contact her to determine a new return  date. We will continue to monitor her progress when she returns to the program. Reviewed home exercise with pt today from 3:30 pm to 3:42 pm.  Pt plans to walk for exercise.  Reviewed THR, pulse, RPE, sign and symptoms, pulse oximetery and when to call 911 or MD.  Also discussed weather considerations and indoor options.  Pt voiced understanding.   Expected Outcomes Short: Progressively increase treadmill and T4 nustep workloads. Long: Continue exercise to improve strength and stamina. Short: return to rehab. Long: Continue exercise to improve strength and stamina. Short: return to rehab. Long: Continue exercise to improve strength and stamina. Short: Return to rehab when appropriate. Long: Continue exercise to improve strength and stamina. Short: add 1-2 days of walking at home on off days of rehab. Long: maintain independent exercise routine upon graduation from rehab.    Row Name 05/22/24 1549 06/04/24 1605 06/19/24 1749 06/25/24 1542 07/04/24 0923     Exercise Goal Re-Evaluation   Exercise Goals Review Increase Physical Activity;Understanding of Exercise Prescription;Increase Strength and Stamina Increase Physical Activity;Understanding of Exercise Prescription;Increase Strength and Stamina Increase Physical Activity;Understanding of Exercise Prescription;Increase Strength and Stamina Increase Physical Activity;Increase Strength and Stamina;Understanding of Exercise Prescription Increase Physical Activity;Increase Strength and Stamina;Understanding of Exercise Prescription;Able to understand and use Dyspnea scale;Knowledge and understanding of Target Heart Rate Range (THRR);Able to understand and use rate of perceived exertion (RPE) scale   Comments Clydene attended one session in this 2 week review. She increased her workload on the treadmill to a speed of 2.6 mph and incline of 0.5%. She maintained level 3 on the T4 nustep. We will continue to monitor her progress in the program. Marijean is doing well  in rehab. She has been able to maintain her current treadmill workload at a speed of 2. and 0.5% incline. She was also able to maintain a level 3 on the T4 nustep. We will continue to monitor her progress in the program. Alisha is doing well in rehab. She increased to level 4 on the T4 nustep. She added the T6 nustep at level 4. She also maintained a speed of 2.6 mph on the treadmill, but dropped the incline down to none. We will continue to monitor her progress in the program. Soniya states that she has not currently added home exercise but still has the information she was given from our program. She is trying to build back up her strength by attending class regularly and is thinking about eventually adding some walking on off days of rehab when she feels ready. She has access to walking outside and a TM. Evany is transferring from [facility] to [facility].   Expected Outcomes Short: Attend rehab more consistently. Long: Continue exercise to improve strength and stamina. Short: Continue to follow exercise prescription. Long: Continue exercise to improve strength and stamina. Short: Progressively increase treadmill workload. Long: Continue exercise to improve strength and stamina. Short: add 1-2 days a week of exercise at home once she feels ready. continue to attend rehab regularly to build up strength. Long: maintain independent exercise routine upon graduation from rehab. Through exercise at rehab and home, the patient will decrease shortness of breath with daily activities and feel confident in carrying out an exercise regimen at home.    Row Name 07/08/24 9096 08/05/24 1546 09/02/24 1541         Exercise Goal Re-Evaluation   Exercise Goals Review Increase Physical Activity;Increase Strength and Stamina;Understanding of Exercise Prescription;Able to understand and use Dyspnea scale;Knowledge and understanding of Target Heart Rate Range (THRR);Able to understand and use rate of perceived exertion (RPE) scale  Increase Physical Activity;Increase Strength and Stamina;Understanding of Exercise Prescription;Able to understand and use Dyspnea scale;Knowledge and understanding of Target Heart Rate Range (THRR);Able to understand and use rate of perceived exertion (RPE) scale Increase Physical Activity;Increase Strength and Stamina;Understanding of Exercise Prescription;Able to understand  and use Dyspnea scale;Knowledge and understanding of Target Heart Rate Range (THRR);Able to understand and use rate of perceived exertion (RPE) scale     Comments Pt to begin exercise 8/28 here at [facility]. Will progress as tolerated. Pt has completed 8 exercise sessions. She is currently exercising on the recumbent stepper for 15 min, level 1, METs 3.1. She then is walking on the treadmill for 15 min, speed 2.7, METs 2.9. She does not want to increase her resistance on the stepper, prefers to increase speed. She performs warm up and cool down standing, including squats. She is unable to use bands due to a left shoulder injury so she is using a 3 lb weight for her right shoulder/arm. Will progress as tolerated. Pt has completed 13 exercise sessions, missing 3 sessions. She is currently exercising on the recumbent stepper for 15 min, level 1, METs 2.8. She then is walking on the treadmill for 15 min, speed 2.3, METs 2.6. She was sick last week so lighter exercise today. She does not want to increase her resistance on the stepper. She performs warm up and cool down standing, including squats. She is unable to use bands due to a left shoulder injury so she is using a 3 lb weight for her right shoulder/arm. Will progress as tolerated.     Expected Outcomes Through exercise at rehab and home, the patient will decrease shortness of breath with daily activities and feel confident in carrying out an exercise regimen at home. Through exercise at rehab and home, the patient will decrease shortness of breath with daily activities and feel confident  in carrying out an exercise regimen at home. Through exercise at rehab and home, the patient will decrease shortness of breath with daily activities and feel confident in carrying out an exercise regimen at home.        Discharge Exercise Prescription (Final Exercise Prescription Changes):  Exercise Prescription Changes - 09/09/24 1500       Response to Exercise   Blood Pressure (Admit) 122/78    Blood Pressure (Exercise) 138/70    Blood Pressure (Exit) 96/58    Heart Rate (Admit) 80 bpm    Heart Rate (Exercise) 84 bpm    Heart Rate (Exit) 86 bpm    Oxygen  Saturation (Admit) 99 %    Oxygen  Saturation (Exercise) 96 %    Oxygen  Saturation (Exit) 96 %    Rating of Perceived Exertion (Exercise) 16    Perceived Dyspnea (Exercise) 3    Duration Continue with 30 min of aerobic exercise without signs/symptoms of physical distress.    Intensity THRR unchanged      Progression   Progression Continue to progress workloads to maintain intensity without signs/symptoms of physical distress.      Resistance Training   Training Prescription Yes    Weight 3lb weight    Reps 10-15    Time 10 Minutes      Treadmill   MPH 2.7    Grade 0    Minutes 15    METs 2.9      NuStep   Level 1    Minutes 15    METs 3          Nutrition:  Target Goals: Understanding of nutrition guidelines, daily intake of sodium 1500mg , cholesterol 200mg , calories 30% from fat and 7% or less from saturated fats, daily to have 5 or more servings of fruits and vegetables.  Biometrics:   Post Biometrics - 07/04/24 9089  Post  Biometrics   Height 5' 3.58 (1.615 m)    BMI (Calculated) 25.53          Nutrition Therapy Plan and Nutrition Goals:   Nutrition Assessments:  MEDIFICTS Score Key: >=70 Need to make dietary changes  40-70 Heart Healthy Diet <= 40 Therapeutic Level Cholesterol Diet   Picture Your Plate Scores: <59 Unhealthy dietary pattern with much room for improvement. 41-50  Dietary pattern unlikely to meet recommendations for good health and room for improvement. 51-60 More healthful dietary pattern, with some room for improvement.  >60 Healthy dietary pattern, although there may be some specific behaviors that could be improved.    Nutrition Goals Re-Evaluation:  Nutrition Goals Re-Evaluation     Row Name 05/19/24 1614 06/30/24 1603           Goals   Comment Patient was informed on why it is important to maintain a balanced diet when dealing with Respiratory issues. Explained that it takes a lot of energy to breath and when they are short of breath often they will need to have a good diet to help keep up with the calories they are expending for breathing. Spoke with Heron about meeting protein needs, keeping saturated fat controlled. She is reading labels and likes the way she has been eating for ~17years.      Expected Outcome Short: Choose and plan snacks accordingly to patients caloric intake to improve breathing. Long: Maintain a diet independently that meets their caloric intake to aid in daily shortness of breath. STG: continue to eat healthy and meet protien needs. LTG: Maintain a diet independently that meets their caloric intake to aid in daily shortness of breath.         Nutrition Goals Discharge (Final Nutrition Goals Re-Evaluation):  Nutrition Goals Re-Evaluation - 06/30/24 1603       Goals   Comment Spoke with Heron about meeting protein needs, keeping saturated fat controlled. She is reading labels and likes the way she has been eating for ~17years.    Expected Outcome STG: continue to eat healthy and meet protien needs. LTG: Maintain a diet independently that meets their caloric intake to aid in daily shortness of breath.          Psychosocial: Target Goals: Acknowledge presence or absence of significant depression and/or stress, maximize coping skills, provide positive support system. Participant is able to verbalize types and  ability to use techniques and skills needed for reducing stress and depression.  Initial Review & Psychosocial Screening:  Initial Psych Review & Screening - 07/04/24 0915       Initial Review   Current issues with None Identified      Family Dynamics   Good Support System? Yes    Comments 2 children and grandchildren      Barriers   Psychosocial barriers to participate in program There are no identifiable barriers or psychosocial needs.      Screening Interventions   Interventions Encouraged to exercise          Quality of Life Scores:  Scores of 19 and below usually indicate a poorer quality of life in these areas.  A difference of  2-3 points is a clinically meaningful difference.  A difference of 2-3 points in the total score of the Quality of Life Index has been associated with significant improvement in overall quality of life, self-image, physical symptoms, and general health in studies assessing change in quality of life.  PHQ-9: Review Flowsheet  More  data exists      07/04/2024 05/19/2024 02/27/2024 12/25/2018 12/26/2017  Depression screen PHQ 2/9  Decreased Interest 0 0 2 0 0  Down, Depressed, Hopeless 0 0 0 0 0  PHQ - 2 Score 0 0 2 0 0  Altered sleeping 1 1 1  - 0  Tired, decreased energy 0 1 2 - 1  Change in appetite 0 0 0 - 0  Feeling bad or failure about yourself  0 0 0 - 0  Trouble concentrating 0 0 0 - 0  Moving slowly or fidgety/restless 0 0 0 - 0  Suicidal thoughts 0 0 0 - 0  PHQ-9 Score 1 2 5  - 1  Difficult doing work/chores Not difficult at all Somewhat difficult Somewhat difficult - Not difficult at all   Interpretation of Total Score  Total Score Depression Severity:  1-4 = Minimal depression, 5-9 = Mild depression, 10-14 = Moderate depression, 15-19 = Moderately severe depression, 20-27 = Severe depression   Psychosocial Evaluation and Intervention:  Psychosocial Evaluation - 07/04/24 0916       Psychosocial Evaluation & Interventions    Interventions Encouraged to exercise with the program and follow exercise prescription    Comments THere are no barriers to attending the program.    Expected Outcomes For Renae to participate in rehab free of psychosocial concerns.    Continue Psychosocial Services  Follow up required by staff          Psychosocial Re-Evaluation:  Psychosocial Re-Evaluation     Row Name 05/19/24 1615 07/09/24 1611 07/11/24 1125 07/11/24 1126 08/04/24 9078     Psychosocial Re-Evaluation   Current issues with -- None Identified -- None Identified None Identified   Comments Reviewed patient health questionnaire (PHQ-9) with patient for follow up. Previously, patients score indicated signs/symptoms of depression.  Reviewed to see if patient is improving symptom wise while in program.  Score improved and patient states that it is because she has been able to exercise and do more. Psychosocial monthly re-evaluation is as follows: Callan came in for orientation on 07/04/24. She scored a 0/1 on her PHQ 2-9. She denies any psychosocial barriers or concerns. She states she has good support from her children and grandchildren. She declines any referrals or additional resources. -- Psychosocial monthly re-evaluation is as follows: Chimene came in for orientation on 07/04/24. She scored a 0/1 on her PHQ 2-9. She denies any psychosocial barriers or concerns. She states she has good support from her children and grandchildren. She declines any referrals or additional resources. Psychosocial monthly re-evaluation is as follows: Ramia denies any psychosocial barriers or concerns. She states she has good support from her children and grandchildren. She declines any referrals or additional resources.   Expected Outcomes Short: Continue to attend [name] regularly for regular exercise and social engagement. Long: Continue to improve symptoms and manage a positive mental state. For Jennife to participate in rehab free of  psychosocial concerns. -- For Novella to participate in rehab free of psychosocial concerns. For Audyn to participate in rehab free of psychosocial concerns.   Interventions Encouraged to attend Pulmonary Rehabilitation for the exercise Encouraged to attend Pulmonary Rehabilitation for the exercise -- Encouraged to attend Pulmonary Rehabilitation for the exercise Encouraged to attend Pulmonary Rehabilitation for the exercise   Continue Psychosocial Services  Follow up required by staff No Follow up required -- No Follow up required No Follow up required    Row Name 09/09/24 1601  Psychosocial Re-Evaluation   Current issues with None Identified       Comments Psychosocial monthly re-evaluation is as follows: Maleiya denies any psychosocial barriers or concerns. She states she has good support from her children and grandchildren. She declines any referrals or additional resources.       Expected Outcomes For Kahlyn to participate in rehab free of psychosocial concerns.       Interventions Encouraged to attend Pulmonary Rehabilitation for the exercise       Continue Psychosocial Services  No Follow up required          Psychosocial Discharge (Final Psychosocial Re-Evaluation):  Psychosocial Re-Evaluation - 09/09/24 1601       Psychosocial Re-Evaluation   Current issues with None Identified    Comments Psychosocial monthly re-evaluation is as follows: Laparis denies any psychosocial barriers or concerns. She states she has good support from her children and grandchildren. She declines any referrals or additional resources.    Expected Outcomes For Dorthie to participate in rehab free of psychosocial concerns.    Interventions Encouraged to attend Pulmonary Rehabilitation for the exercise    Continue Psychosocial Services  No Follow up required          Education: Education Goals: Education classes will be provided on a weekly basis, covering required topics. Participant  will state understanding/return demonstration of topics presented.  Learning Barriers/Preferences:  Learning Barriers/Preferences - 07/04/24 0917       Learning Barriers/Preferences   Learning Barriers Hearing   has hearing aids   Learning Preferences None          Education Topics: Know Your Numbers Group instruction that is supported by a PowerPoint presentation. Instructor discusses importance of knowing and understanding resting, exercise, and post-exercise oxygen  saturation, heart rate, and blood pressure. Oxygen  saturation, heart rate, blood pressure, rating of perceived exertion, and dyspnea are reviewed along with a normal range for these values.  Flowsheet Row PULMONARY REHAB OTHER RESPIRATORY from 07/31/2024 in [facility] for Heart, Vascular, & Lung Health  Date 07/31/24  Educator EP  Instruction Review Code 1- Verbalizes Understanding    Exercise for the Pulmonary Patient Group instruction that is supported by a PowerPoint presentation. Instructor discusses benefits of exercise, core components of exercise, frequency, duration, and intensity of an exercise routine, importance of utilizing pulse oximetry during exercise, safety while exercising, and options of places to exercise outside of rehab.  Flowsheet Row PULMONARY REHAB OTHER RESPIRATORY from 07/24/2024 in [facility] for Heart, Vascular, & Lung Health  Date 07/24/24  Educator EP  Instruction Review Code 1- Verbalizes Understanding    MET Level  Group instruction provided by PowerPoint, verbal discussion, and written material to support subject matter. Instructor reviews what METs are and how to increase METs.    Pulmonary Medications Verbally interactive group education provided by instructor with focus on inhaled medications and proper administration. Flowsheet Row PULMONARY REHAB OTHER RESPIRATORY from 07/17/2024 in [facility] for Heart,  Vascular, & Lung Health  Date 07/17/24  Educator RT  Instruction Review Code 1- Verbalizes Understanding    Anatomy and Physiology of the Respiratory System Group instruction provided by PowerPoint, verbal discussion, and written material to support subject matter. Instructor reviews respiratory cycle and anatomical components of the respiratory system and their functions. Instructor also reviews differences in obstructive and restrictive respiratory diseases with examples of each.  Flowsheet Row PULMONARY REHAB OTHER RESPIRATORY from 07/10/2024 in [facility] Heart, Vascular, & Lung Health  Date 07/10/24  Educator RT  Instruction Review Code 1- Verbalizes Understanding    Oxygen  Safety Group instruction provided by PowerPoint, verbal discussion, and written material to support subject matter. There is an overview of "What is Oxygen " and "Why do we need it".  Instructor also reviews how to create a safe environment for oxygen  use, the importance of using oxygen  as prescribed, and the risks of noncompliance. There is a brief discussion on traveling with oxygen  and resources the patient may utilize. Flowsheet Row PULMONARY REHAB OTHER RESPIRATORY from 08/07/2024 in [facility] for Heart, Vascular, & Lung Health  Date 08/07/24  Educator RN  Instruction Review Code 1- Verbalizes Understanding    Oxygen  Use Group instruction provided by PowerPoint, verbal discussion, and written material to discuss how supplemental oxygen  is prescribed and different types of oxygen  supply systems. Resources for more information are provided.  Flowsheet Row PULMONARY REHAB OTHER RESPIRATORY from 08/14/2024 in [facility] for Heart, Vascular, & Lung Health  Date 08/14/24  Educator RT  Instruction Review Code 1- Verbalizes Understanding    Breathing Techniques Group instruction that is supported by demonstration and informational  handouts. Instructor discusses the benefits of pursed lip and diaphragmatic breathing and detailed demonstration on how to perform both.     Risk Factor Reduction Group instruction that is supported by a PowerPoint presentation. Instructor discusses the definition of a risk factor, different risk factors for pulmonary disease, and how the heart and lungs work together.   Pulmonary Diseases Group instruction provided by PowerPoint, verbal discussion, and written material to support subject matter. Instructor gives an overview of the different type of pulmonary diseases. There is also a discussion on risk factors and symptoms as well as ways to manage the diseases.   Stress and Energy Conservation Group instruction provided by PowerPoint, verbal discussion, and written material to support subject matter. Instructor gives an overview of stress and the impact it can have on the body. Instructor also reviews ways to reduce stress. There is also a discussion on energy conservation and ways to conserve energy throughout the day.   Warning Signs and Symptoms Group instruction provided by PowerPoint, verbal discussion, and written material to support subject matter. Instructor reviews warning signs and symptoms of stroke, heart attack, cold and flu. Instructor also reviews ways to prevent the spread of infection. Flowsheet Row PULMONARY REHAB OTHER RESPIRATORY from 09/04/2024 in [facility] for Heart, Vascular, & Lung Health  Date 09/04/24  Educator RN  Instruction Review Code 1- Verbalizes Understanding    Other Education Group or individual verbal, written, or video instructions that support the educational goals of the pulmonary rehab program. Flowsheet Row Pulmonary Rehab from 01/02/2018 in [facility]  Date 12/05/17  Educator [facility]  Instruction Review Code 1- Verbalizes Understanding  [SLEEP]     Knowledge Questionnaire Score:  Knowledge  Questionnaire Score - 07/04/24 9047       Knowledge Questionnaire Score   Pre Score 18/18          Core Components/Risk Factors/Patient Goals at Admission:  Personal Goals and Risk Factors at Admission - 07/04/24 0917       Core Components/Risk Factors/Patient Goals on Admission   Admit Weight --    Goal Weight: Short Term --    Goal Weight: Long Term --    Improve shortness of breath with ADL's Yes  Intervention Provide education, individualized exercise plan and daily activity instruction to help decrease symptoms of SOB with activities of daily living.    Expected Outcomes Short Term: Improve cardiorespiratory fitness to achieve a reduction of symptoms when performing ADLs;Long Term: Be able to perform more ADLs without symptoms or delay the onset of symptoms    Expected Outcomes --          Core Components/Risk Factors/Patient Goals Review:   Goals and Risk Factor Review     Row Name 05/19/24 1613 07/09/24 1610 08/04/24 0922 09/09/24 1602       Core Components/Risk Factors/Patient Goals Review   Personal Goals Review Improve shortness of breath with ADL's Improve shortness of breath with ADL's;Develop more efficient breathing techniques such as purse lipped breathing and diaphragmatic breathing and practicing self-pacing with activity. Improve shortness of breath with ADL's;Develop more efficient breathing techniques such as purse lipped breathing and diaphragmatic breathing and practicing self-pacing with activity. Improve shortness of breath with ADL's    Review Spoke to patient about their shortness of breath and what they can do to improve. Patient has been informed of breathing techniques when starting the program. Patient is informed to tell staff if they have had any med changes and that certain meds they are taking or not taking can be causing shortness of breath. Monthly review of patient's Core Components/Risk Factors/Patient Goals are as follows: Unable to assess  goals. Kadi hasn't started the program yet. She is scheduled to start tomorrow, 07/10/24. Monthly review of patient's Core Components/Risk Factors/Patient Goals are as follows: Goal in progress for improving her shortness of breath with ADLs. Allegra is trying to build up her strength and endurance. She completed the [name] at [place] and continued to the [facility] last month. She is progressing on Nustep and treadmill, increasing her workload, METs, speed and incline. She is stable on room air with exertion. Goal met for developing more efficient breathing techniques such as purse lipped breathing and diaphragmatic breathing; and practicing self-pacing with activity. Karlisa can perform purse lipped breathing while short of breath. She demonstrated this while performing the warmup and while exercising. She can initiate PLB on her own. She works on diaphragmatic breathing at home. Kamara will continue to benefit from PR for nutrition, education, exercise, and lifestyle modification. Monthly review of patient's Core Components/Risk Factors/Patient Goals are as follows: Goal in progress for improving her shortness of breath with ADLs. Taleya is trying to build up her strength and endurance. She is progressing on Nustep and treadmill, increasing her workload, METs, speed and incline. She is stable on room air with exertion. Clora will continue to benefit from PR for nutrition, education, exercise, and lifestyle modification.    Expected Outcomes Short: Attend [name] regularly to improve shortness of breath with ADL's. Long: maintain independence with ADL's Pt will show progress toward meeting expected goals and outcomes. Pt will show progress toward meeting expected goals and outcomes. Pt will show progress toward meeting expected goals and outcomes.       Core Components/Risk Factors/Patient Goals at Discharge (Final Review):   Goals and Risk Factor Review - 09/09/24 1602        Core Components/Risk Factors/Patient Goals Review   Personal Goals Review Improve shortness of breath with ADL's    Review Monthly review of patient's Core Components/Risk Factors/Patient Goals are as follows: Goal in progress for improving her shortness of breath with ADLs. Symphani is trying to build up her strength and endurance. She  is progressing on Nustep and treadmill, increasing her workload, METs, speed and incline. She is stable on room air with exertion. Brietta will continue to benefit from PR for nutrition, education, exercise, and lifestyle modification.    Expected Outcomes Pt will show progress toward meeting expected goals and outcomes.          ITP Comments: Pt is making expected progress toward Pulmonary Rehab goals after completing 15 session(s). Recommend continued exercise, life style modification, education, and utilization of breathing techniques to increase stamina and strength, while also decreasing shortness of breath with exertion.  Dr. Slater Staff is Medical Director for Pulmonary Rehab at [facility].

## 2024-09-11 ENCOUNTER — Encounter (HOSPITAL_COMMUNITY)
Admission: RE | Admit: 2024-09-11 | Discharge: 2024-09-11 | Disposition: A | Discharge status: Home / Self Care | Source: Ambulatory Visit | Attending: Pulmonary Disease | Admitting: Pulmonary Disease

## 2024-09-11 DIAGNOSIS — I272 Pulmonary hypertension, unspecified: Secondary | ICD-10-CM | POA: Diagnosis not present

## 2024-09-11 NOTE — Telephone Encounter (Signed)
 My suspicion is that the nebulizer is causing mucous to either be produced in small quantities or is perhaps causing a small amount of mucous production. If remains clear then I would not worry much about it. IF it worsens in quantify or change in color etc, then please contact us . Overall, it seems it has improved your breathing overall so I would favor continuing the [place] .

## 2024-09-11 NOTE — Telephone Encounter (Signed)
 [name] it was nice speaking with you this morning as I stated Per Dr. Annella in our conversation if anything changes please don't hesitate to reach out to us .  -  Have a wonderful weekend.

## 2024-09-11 NOTE — Progress Notes (Signed)
 Daily Session Note  Patient Details  Name: [name] MRN: [id number] Date of Birth: 1946/09/17 Referring Provider:   [name] Pulmonary Rehab Walk Test from 07/04/2024 in [facility] for Heart, Vascular, & Lung Health  Referring Provider [name]    Encounter Date: 09/11/2024  Check In:  Session Check In - 09/11/24 1436       Check-In   Supervising physician immediately available to respond to emergencies [facility] MD immediately available    Physician(s) Rosaline Bane, NP    Location [facility]    Staff Present Ronal Levin, RN, BSN;Casey Claudene, RT;Randi [employer] BS, ACSM-CEP, Exercise Physiologist;Kaylee Nicholaus, MS, ACSM-CEP, Exercise Physiologist    Virtual Visit No    Medication changes reported     No    Fall or balance concerns reported    No    Tobacco Cessation No Change    Warm-up and Cool-down Performed as group-led instruction    Resistance Training Performed Yes    VAD Patient? No      VAD patient   Has back up controller? No      Pain Assessment   Currently in Pain? No/denies    Multiple Pain Sites No          Capillary Blood Glucose: No results found. However, due to the size of the patient record, not all encounters were searched. Please check Results Review for a complete set of results.    Social History   Tobacco Use  Smoking Status Former   Current packs/day: 0.00   Average packs/day: 1 pack/day for 40.0 years (40.0 ttl pk-yrs)   Types: Cigarettes   Start date: 09/15/1967   Quit date: 09/15/2007   Years since quitting: 17.0  Smokeless Tobacco Never  Tobacco Comments   Former smoker Quit 2008    Goals Met:  Independence with exercise equipment Exercise tolerated well No report of concerns or symptoms today Strength training completed today  Goals Unmet:  Not Applicable  Comments: Service time is from 1319 to 1441    Dr. Slater Staff is Medical Director for Pulmonary Rehab at [facility].

## 2024-09-15 ENCOUNTER — Telehealth: Payer: Self-pay | Admitting: Cardiology

## 2024-09-15 NOTE — Telephone Encounter (Signed)
 Pt reports monitor caused her to having itching on her chest.  It was placed 10/30 at pulmonary rehab.  That night she reports is was causing her to itch.  The next day it was itching and she reports having chest pain which is not normal for her.  She removed it yesterday and will mail it back.  She reports still having a rash and itching but no further chest pain.  When she had chest pain it was on  the left side of her chest and into throat.  Felt like tightness.  Woke from her sleep and lasted the rest of the night.  No further side pain since she removed monitor.   Pt also reports it was no longer working because she took a shower with it on.

## 2024-09-15 NOTE — Telephone Encounter (Signed)
 Attempted to contact pt regarding her concerns.  No answer.  Left message to call back to discuss.

## 2024-09-15 NOTE — Telephone Encounter (Signed)
 Followed up with pt about issue/concern.  She placed monitor on 10/30 mid afternoon and by that evening she was itching around site and experiencing some chest discomfort as well around the area.  She wore it for 3 days but the irritation got so bad she had to take it off  (she also reports that it stopped working after it got wet, clarified she did keep her back to the water ) and called zio.  She mailed it back this afternoon. Aware that we really need closer to the 2 weeks and not sure the 3 days is going to give us  enough information.  Aware I am going to forward to our monitor team to follow up and see if option available for very sensitive skin  (states she will not wear another Zio). Aware someone will call her from that team to discuss options.  Will also discuss options for not being charged for 2 monitors if able to get a second one on that does not irritate her skin.  Is there a way to hold onto to the 3 day info from monitor until we can ensure another monitor is tolerated.  If 2nd monitor tolerated can delete the 3 day without charging/needing the information -- will discuss this further with monitor team.  Pt agreeable to above plan.

## 2024-09-15 NOTE — Telephone Encounter (Signed)
  Patient is calling in today regarding the heart monitor that Dr Inocencio ordered for her. She had a bad reaction to the adhesive and she states that she also had chest pain and shortness of breath. She states that all the symptoms stopped once she removed the monitor. She would like to speak to the nurse about the situation.

## 2024-09-16 ENCOUNTER — Encounter (HOSPITAL_COMMUNITY)
Admission: RE | Admit: 2024-09-16 | Discharge: 2024-09-16 | Disposition: A | Discharge status: Home / Self Care | Source: Ambulatory Visit | Attending: Pulmonary Disease | Admitting: Pulmonary Disease

## 2024-09-16 DIAGNOSIS — I272 Pulmonary hypertension, unspecified: Secondary | ICD-10-CM | POA: Diagnosis present

## 2024-09-16 NOTE — Progress Notes (Signed)
 Daily Session Note  Patient Details  Name: NYIA TSAO MRN: [id number] Date of Birth: 1945/11/30 Referring Provider:   [name] Pulmonary Rehab Walk Test from 07/04/2024 in [facility] for Heart, Vascular, & Lung Health  Referring Provider [name]    Encounter Date: 09/16/2024  Check In:  Session Check In - 09/16/24 1419       Check-In   Supervising physician immediately available to respond to emergencies [facility] MD immediately available    Physician(s) Rosaline Bane, NP    [facility]    Staff Present Ronal Levin, RN, BSN;Adon Gehlhausen Claudene, RT;Randi [facility] BS, ACSM-CEP, Exercise Physiologist;Kaylee Nicholaus, MS, ACSM-CEP, Exercise Physiologist    Virtual Visit No    Medication changes reported     No    Fall or balance concerns reported    No    Tobacco Cessation No Change    Warm-up and Cool-down Performed as group-led instruction    Resistance Training Performed Yes    VAD Patient? No    PAD/SET Patient? No      VAD patient   Has back up controller? No      Pain Assessment   Currently in Pain? No/denies    Multiple Pain Sites No          Capillary Blood Glucose: No results found. However, due to the size of the patient record, not all encounters were searched. Please check Results Review for a complete set of results.    Social History   Tobacco Use  Smoking Status Former   Current packs/day: 0.00   Average packs/day: 1 pack/day for 40.0 years (40.0 ttl pk-yrs)   Types: Cigarettes   Start date: 09/15/1967   Quit date: 09/15/2007   Years since quitting: 17.0  Smokeless Tobacco Never  Tobacco Comments   Former smoker Quit 2008    Goals Met:  Proper associated with RPD/PD & O2 Sat Independence with exercise equipment Exercise tolerated well No report of concerns or symptoms today Strength training completed today  Goals Unmet:  Not Applicable  Comments: Service time is from 1320 to 1430.    Dr.  Slater Staff is Medical Director for Pulmonary Rehab at [facility].

## 2024-09-16 NOTE — Telephone Encounter (Signed)
 Followed up with pt earlier this morning after speaking with monitor team.  Pt made aware if we perform/order another monitor she will be charged for a second one.  Pt reports she does not have an OOP cost for this so if it is needed we can order another  (Pre[employer]ice for sensitive skin). Aware that  I am forwarding to MD to make him aware pt was only able to wear the 2 week monitor for only 3 days d/t reaction.  Pt informed that we will see what the 3 days shows before determining in another monitor is needed. Patient verbalized understanding and agreeable to plan.

## 2024-09-18 ENCOUNTER — Encounter (HOSPITAL_COMMUNITY)
Admission: RE | Admit: 2024-09-18 | Discharge: 2024-09-18 | Disposition: A | Source: Ambulatory Visit | Attending: Pulmonary Disease | Admitting: Pulmonary Disease

## 2024-09-18 DIAGNOSIS — I272 Pulmonary hypertension, unspecified: Secondary | ICD-10-CM

## 2024-09-18 NOTE — Progress Notes (Signed)
 Daily Session Note  Patient Details  Name: NIKO JAKEL MRN: [id number] Date of Birth: May 31, 1946 Referring Provider:   [name] Pulmonary Rehab Walk Test from 07/04/2024 in [facility] for Heart, Vascular, & Lung Health  Referring Provider [name]    Encounter Date: 09/18/2024  Check In:  Session Check In - 09/18/24 1359       Check-In   Supervising physician immediately available to respond to emergencies [facility] MD immediately available    Physician(s) Orren Fabry, NP    [facility]    Staff Present Ronal Levin, RN, BSN;Casey Claudene Neita Moats, MS, ACSM-CEP, Exercise Physiologist    Virtual Visit No    Medication changes reported     No    Fall or balance concerns reported    No    Tobacco Cessation No Change    Warm-up and Cool-down Performed as group-led instruction    Resistance Training Performed Yes    VAD Patient? No    PAD/SET Patient? No      VAD patient   Has back up controller? No      Pain Assessment   Currently in Pain? No/denies    Multiple Pain Sites No          Capillary Blood Glucose: No results found. However, due to the size of the patient record, not all encounters were searched. Please check Results Review for a complete set of results.    Social History   Tobacco Use  Smoking Status Former   Current packs/day: 0.00   Average packs/day: 1 pack/day for 40.0 years (40.0 ttl pk-yrs)   Types: Cigarettes   Start date: 09/15/1967   Quit date: 09/15/2007   Years since quitting: 17.0  Smokeless Tobacco Never  Tobacco Comments   Former smoker Quit 2008    Goals Met:  Independence with exercise equipment Exercise tolerated well Queuing for purse lip breathing No report of concerns or symptoms today Strength training completed today  Goals Unmet:  Not Applicable  Comments: Service time is from 1315 to 1428    Dr. Slater Staff is Medical Director for Pulmonary Rehab at [facility]

## 2024-09-19 DIAGNOSIS — I480 Paroxysmal atrial fibrillation: Secondary | ICD-10-CM | POA: Diagnosis not present

## 2024-09-23 ENCOUNTER — Ambulatory Visit: Payer: Self-pay | Admitting: Cardiology

## 2024-09-23 ENCOUNTER — Encounter (HOSPITAL_COMMUNITY)
Admission: RE | Admit: 2024-09-23 | Discharge: 2024-09-23 | Disposition: A | Discharge status: Home / Self Care | Source: Ambulatory Visit | Attending: Pulmonary Disease | Admitting: Pulmonary Disease

## 2024-09-23 VITALS — Wt 148.6 lb

## 2024-09-23 DIAGNOSIS — I272 Pulmonary hypertension, unspecified: Secondary | ICD-10-CM | POA: Diagnosis not present

## 2024-09-23 DIAGNOSIS — I480 Paroxysmal atrial fibrillation: Secondary | ICD-10-CM

## 2024-09-23 NOTE — Progress Notes (Signed)
 Daily Session Note  Patient Details  Name: [name] MRN: [id number] Date of Birth: [birth year] Referring Provider:   [name] Pulmonary Rehab Walk Test from 07/04/2024 in [facility] for Heart, Vascular, & Lung Health  Referring Provider [name]    Encounter Date: 09/23/2024  Check In:  Session Check In - 09/23/24 1414       Check-In   Supervising physician immediately available to respond to emergencies [facility] MD immediately available    Physician(s) Barnie Hila, NP    [facility]    Staff Present Ronal Levin, RN, BSN;Casey Smith, Neita Moats, MS, ACSM-CEP, Exercise Physiologist;Randi Midge HECKLE, ACSM-CEP, Exercise Physiologist;Annedrea Violetta, RN, [place]    Virtual Visit No    Medication changes reported     No    Fall or balance concerns reported    No    Tobacco Cessation No Change    Warm-up and Cool-down Performed as group-led instruction    Resistance Training Performed Yes    VAD Patient? No    PAD/SET Patient? No      VAD patient   Has back up controller? No      Pain Assessment   Currently in Pain? No/denies    Multiple Pain Sites No          Capillary Blood Glucose: No results found. However, due to the size of the patient record, not all encounters were searched. Please check Results Review for a complete set of results.   Exercise Prescription Changes - 09/23/24 1500       Response to Exercise   Blood Pressure (Admit) 142/72    Blood Pressure (Exercise) 160/68    Blood Pressure (Exit) 118/68    Heart Rate (Admit) 78 bpm    Heart Rate (Exercise) 102 bpm    Heart Rate (Exit) 84 bpm    Oxygen  Saturation (Admit) 100 %    Oxygen  Saturation (Exercise) 95 %    Oxygen  Saturation (Exit) 98 %    Rating of Perceived Exertion (Exercise) 13    Perceived Dyspnea (Exercise) 3    Duration Continue with 30 min of aerobic exercise without signs/symptoms of physical distress.    Intensity THRR  unchanged      Progression   Progression Continue to progress workloads to maintain intensity without signs/symptoms of physical distress.      Resistance Training   Training Prescription Yes    Weight 3lb weight    Reps 10-15    Time 10 Minutes      Treadmill   MPH 2.7    Grade 0    Minutes 15    METs 2.9      NuStep   Level 1    SPM 110    Minutes 15    METs 3.4      Oxygen    Maintain Oxygen  Saturation 88% or higher          Social History   Tobacco Use  Smoking Status Former   Current packs/day: 0.00   Average packs/day: 1 pack/day for 40.0 years (40.0 ttl pk-yrs)   Types: Cigarettes   Start date: 09/15/1967   Quit date: 09/15/2007   Years since quitting: 17.0  Smokeless Tobacco Never  Tobacco Comments   Former smoker Quit 2008    Goals Met:  Independence with exercise equipment Exercise tolerated well Queuing for purse lip breathing No report of concerns or symptoms today Strength training completed today  Goals Unmet:  Not  Applicable  Comments: Service time is from 1318 to 1442    Dr. Slater Staff is Medical Director for Pulmonary Rehab at [facility].

## 2024-09-24 MED ORDER — METOPROLOL SUCCINATE ER 50 MG PO TB24: 50.0000 mg | ORAL_TABLET | Freq: Every day | ORAL | Status: DC

## 2024-09-24 NOTE — Telephone Encounter (Signed)
-----   Message from Will Woodson Camnitz sent at 09/23/2024  7:00 PM EST ----- Prolonged SVT episode Low PVC burden.  Increase toprol  xl to 50 mg daily ----- Message ----- From: Inocencio Soyla Lunger, MD Sent: 09/23/2024   5:09 PM EST To: Will Lunger Inocencio, MD

## 2024-09-24 NOTE — Telephone Encounter (Signed)
 Pt made aware of monitor findings. Will increase Toprol  to 50 mg on MAR, pt will let us  know when she needs 50 mg tablets sent in to pharmacy.  Pt agreeable to plan

## 2024-09-25 ENCOUNTER — Encounter (HOSPITAL_COMMUNITY)
Admission: RE | Admit: 2024-09-25 | Discharge: 2024-09-25 | Disposition: A | Source: Ambulatory Visit | Attending: Pulmonary Disease | Admitting: Pulmonary Disease

## 2024-09-25 DIAGNOSIS — I272 Pulmonary hypertension, unspecified: Secondary | ICD-10-CM

## 2024-09-25 NOTE — Progress Notes (Signed)
 Home Exercise Prescription I have reviewed a Home Exercise Prescription with [name].  She is not currently exercising at home but has in the past. She plans to start walking at home for 30 min. She is also planning to join the gym and exercise on the aerobic machines there. She hopes to exercise 30 min or more 5 days a week. I encouraged her to add intensity as able. The patient stated that their goals were to keep exercising and work up to walking 3 mph. We reviewed exercise guidelines, target heart rate during exercise, RPE Scale, weather conditions, endpoints for exercise, warmup and cool down. The patient is encouraged to come to me with any questions. I will continue to follow up with the patient to assist them with progression and safety.  Spent 15 min discussing home exercise plan and goals.  Nedim Oki Vidal[place], [place], ACSM-CEP 09/25/2024 3:42 PM

## 2024-09-25 NOTE — Progress Notes (Signed)
 Daily Session Note  Patient Details  Name: [name] MRN: [id number] Date of Birth: December 02, 1945 Referring Provider:   [name] Pulmonary Rehab Walk Test from 07/04/2024 in [facility] for Heart, Vascular, & Lung Health  Referring Provider [name]    Encounter Date: 09/25/2024  Check In:  Session Check In - 09/25/24 1336       Check-In   Supervising physician immediately available to respond to emergencies [facility] MD immediately available    Physician(s) Barnie Hila, NP    [facility]    Staff Present Ronal Levin, RN, BSN;Casey Claudene, Neita Moats, MS, ACSM-CEP, Exercise Physiologist;Randi Midge HECKLE, ACSM-CEP, Exercise Physiologist    Virtual Visit No    Medication changes reported     No    Fall or balance concerns reported    No    Tobacco Cessation No Change    Warm-up and Cool-down Performed as group-led instruction    Resistance Training Performed Yes    VAD Patient? No    PAD/SET Patient? No      VAD patient   Has back up controller? No      Pain Assessment   Currently in Pain? No/denies    Multiple Pain Sites No          Capillary Blood Glucose: No results found. However, due to the size of the patient record, not all encounters were searched. Please check Results Review for a complete set of results.    Social History   Tobacco Use  Smoking Status Former   Current packs/day: 0.00   Average packs/day: 1 pack/day for 40.0 years (40.0 ttl pk-yrs)   Types: Cigarettes   Start date: 09/15/1967   Quit date: 09/15/2007   Years since quitting: 17.0  Smokeless Tobacco Never  Tobacco Comments   Former smoker Quit 2008    Goals Met:  Independence with exercise equipment Exercise tolerated well Queuing for purse lip breathing No report of concerns or symptoms today Strength training completed today  Goals Unmet:  Not Applicable  Comments: Service time is from 1318 to 1446    Dr. Slater Staff is Medical Director for Pulmonary Rehab at [facility].

## 2024-09-29 ENCOUNTER — Telehealth: Payer: Self-pay | Admitting: Pharmacy Technician

## 2024-09-29 NOTE — Telephone Encounter (Signed)
 Hi scanned in media is amgen repatha  replacement request for this patient. Can someone please get the provider to sign and fax back to [phone]? Thank you

## 2024-09-29 NOTE — Telephone Encounter (Signed)
How do I sign this?

## 2024-09-29 NOTE — Telephone Encounter (Signed)
 Forwarding to Dr. Renato office to address Repatha  paperwork signature needed   (Dr. Inocencio does not order this medication)

## 2024-09-30 ENCOUNTER — Encounter (HOSPITAL_COMMUNITY)
Admission: RE | Admit: 2024-09-30 | Discharge: 2024-09-30 | Disposition: A | Discharge status: Home / Self Care | Source: Ambulatory Visit | Attending: Pulmonary Disease | Admitting: Pulmonary Disease

## 2024-09-30 DIAGNOSIS — I272 Pulmonary hypertension, unspecified: Secondary | ICD-10-CM | POA: Diagnosis not present

## 2024-09-30 NOTE — Progress Notes (Signed)
 Daily Session Note  Patient Details  Name: [name] MRN: [id number] Date of Birth: 10-16-1946 Referring Provider:   [name] Pulmonary Rehab Walk Test from 07/04/2024 in [facility] for Heart, Vascular, & Lung Health  Referring Provider [name]    Encounter Date: 09/30/2024  Check In:  Session Check In - 09/30/24 1434       Check-In   Supervising physician immediately available to respond to emergencies [facility] MD immediately available    Physician(s) Josefa Beauvais, NP    [facility]    Staff Present Ronal Levin, RN, BSN;Casey Claudene, Neita Moats, MS, ACSM-CEP, Exercise Physiologist;Randi Midge BS, ACSM-CEP, Exercise Physiologist    Virtual Visit No    Medication changes reported     No    Fall or balance concerns reported    No    Tobacco Cessation No Change    Warm-up and Cool-down Performed as group-led instruction    Resistance Training Performed Yes    VAD Patient? No    PAD/SET Patient? No      VAD patient   Has back up controller? No      Pain Assessment   Currently in Pain? No/denies    Multiple Pain Sites No          Capillary Blood Glucose: No results found. However, due to the size of the patient record, not all encounters were searched. Please check Results Review for a complete set of results.    Social History   Tobacco Use  Smoking Status Former   Current packs/day: 0.00   Average packs/day: 1 pack/day for 40.0 years (40.0 ttl pk-yrs)   Types: Cigarettes   Start date: 09/15/1967   Quit date: 09/15/2007   Years since quitting: 17.0  Smokeless Tobacco Never  Tobacco Comments   Former smoker Quit 2008    Goals Met:  Proper associated with RPD/PD & O2 Sat Independence with exercise equipment Exercise tolerated well No report of concerns or symptoms today Strength training completed today  Goals Unmet:  Not Applicable  Comments: Service time is from 1320 to 1443.    Dr. Slater Staff is Medical Director for Pulmonary Rehab at [facility].

## 2024-10-01 ENCOUNTER — Other Ambulatory Visit: Payer: Self-pay | Admitting: Pharmacist Clinician (PhC)/ Clinical Pharmacy Specialist

## 2024-10-01 MED ORDER — REPATHA SURECLICK 140 MG/ML ~~LOC~~ SOAJ: 140.0000 mg | SUBCUTANEOUS | 0 refills | Status: AC

## 2024-10-01 NOTE — Telephone Encounter (Signed)
 Hi, I printed this and put it in the mailbox here at [facility] for Dr. Darron. Thank you!

## 2024-10-01 NOTE — Telephone Encounter (Signed)
 Signed form has been successfully faxed back to the number provided.

## 2024-10-01 NOTE — Telephone Encounter (Signed)
 Faxed as requested

## 2024-10-02 ENCOUNTER — Encounter (HOSPITAL_COMMUNITY)
Admission: RE | Admit: 2024-10-02 | Discharge: 2024-10-02 | Disposition: A | Discharge status: Home / Self Care | Source: Ambulatory Visit | Attending: Pulmonary Disease | Admitting: Pulmonary Disease

## 2024-10-02 DIAGNOSIS — I272 Pulmonary hypertension, unspecified: Secondary | ICD-10-CM

## 2024-10-02 NOTE — Progress Notes (Signed)
 Daily Session Note  Patient Details  Name: [name] MRN: [id number] Date of Birth: 03-13-1946 Referring Provider:   [name] Pulmonary Rehab Walk Test from 07/04/2024 in [facility] for [facility], Vascular, [facility]  Referring Provider [name]    Encounter Date: 10/02/2024  Check In:  Session Check In - 10/02/24 1327       Check-In   Supervising physician immediately available to respond to emergencies [facility] MD immediately available    Physician(s) Orren Fabry, PA    Location [facility]    Staff Present Ronal Levin, RN, BSN;Casey Claudene, Neita Moats, MS, ACSM-CEP, Exercise Physiologist;Randi Midge BS, ACSM-CEP, Exercise Physiologist    Virtual Visit No    Medication changes reported     No    Fall or balance concerns reported    No    Tobacco Cessation No Change    Warm-up and Cool-down Performed as group-led instruction    Resistance Training Performed Yes    VAD Patient? No    PAD/SET Patient? No      VAD patient   Has back up controller? No      Pain Assessment   Currently in Pain? No/denies    Multiple Pain Sites No          Capillary Blood Glucose: No results found. However, due to the size of the patient record, not all encounters were searched. Please check Results Review for a complete set of results.    Social History   Tobacco Use  Smoking Status Former   Current packs/day: 0.00   Average packs/day: 1 pack/day for 40.0 years (40.0 ttl pk-yrs)   Types: Cigarettes   Start date: 09/15/1967   Quit date: 09/15/2007   Years since quitting: 17.0  Smokeless Tobacco Never  Tobacco Comments   Former smoker Quit 2008    Goals Met:  Proper associated with RPD/PD & O2 Sat Exercise tolerated well No report of concerns or symptoms today Strength training completed today  Goals Unmet:  Not Applicable  Comments: Service time is from 1311 to 1439.    Dr. Slater Staff is Medical Director for  Pulmonary Rehab at [facility].

## 2024-10-03 ENCOUNTER — Telehealth: Payer: Self-pay

## 2024-10-03 ENCOUNTER — Encounter: Payer: Self-pay | Admitting: Pulmonary Disease

## 2024-10-03 NOTE — Telephone Encounter (Signed)
 Mychart msg 1 : Hello Dr. Annella, The sputum has changed from clear to a light-green color and whereas I had never had sputum or a cough, I do have some coughing now too.  Neither is constant but usually once or twice a day I'll have some coughing and then at times some light-green sputum.  I don't have a sinus infection or anything else except these two symptoms.  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  I will complete the 24-session of pulmonary rehab at [facility] in [place] on Tuesday, November 25th.  That is an excellent program and staff.  I had hoped to reach 3 mph but it looks like I'll finish at around 2.6 mph on the treadmill.  It seems like breathing and stamina have greatly decreased since my one and only covid infection last September 2024.  Thank you for your guidance. [name]  Mychart msg 2 : Hello again, This morning I read some of the side effects of one new drug I've been taking this year - Repatha , which has greatly reduced my LDL and somewhat lowered my Lp(a).  It states that in a clinical trial of 599 patients, 23% over 65, 52% women, 85% white, Repatha  caused upper respiratory infection in 9.3% of participants.  This info comes from the material included with the prescription.  I started Repatha  sometime around May this year, I think, but the sputum and occasional cough didn't appear until the past few months.  I hope Repatha  isn't the cause but maybe worth noting.  Thank you. [name]  Please advise

## 2024-10-07 ENCOUNTER — Encounter (HOSPITAL_COMMUNITY)
Admission: RE | Admit: 2024-10-07 | Discharge: 2024-10-07 | Disposition: A | Source: Ambulatory Visit | Attending: Pulmonary Disease | Admitting: Pulmonary Disease

## 2024-10-07 VITALS — Wt 147.7 lb

## 2024-10-07 DIAGNOSIS — I272 Pulmonary hypertension, unspecified: Secondary | ICD-10-CM | POA: Diagnosis not present

## 2024-10-07 NOTE — Progress Notes (Signed)
 Daily Session Note  Patient Details  Name: [name] MRN: [id number] Date of Birth: December 01, 1945 Referring Provider:   [name] Pulmonary Rehab Walk Test from 07/04/2024 in [facility] for [facility], Vascular, & Lung [facility]  Referring Provider [name]    Encounter Date: 10/07/2024  Check In:  Session Check In - 10/07/24 1435       Check-In   Supervising physician immediately available to respond to emergencies [facility] MD immediately available    Physician(s) Rosabel Mose, NP    Location [facility]    Staff Present Ronal Levin, RN, BSN;Alekai Pocock Claudene, Neita Moats, MS, ACSM-CEP, Exercise Physiologist;Randi Midge BS, ACSM-CEP, Exercise Physiologist    Virtual Visit No    Medication changes reported     No    Fall or balance concerns reported    No    Tobacco Cessation No Change    Warm-up and Cool-down Performed as group-led instruction    Resistance Training Performed Yes    VAD Patient? No    PAD/SET Patient? No      VAD patient   Has back up controller? No      Pain Assessment   Currently in Pain? No/denies    Multiple Pain Sites No          Capillary Blood Glucose: No results found. However, due to the size of the patient record, not all encounters were searched. Please check Results Review for a complete set of results.   Exercise Prescription Changes - 10/07/24 1500       Response to Exercise   Blood Pressure (Admit) 130/68    Blood Pressure (Exercise) 142/70    Blood Pressure (Exit) 128/60    Heart Rate (Admit) 64 bpm    Heart Rate (Exercise) 87 bpm    Heart Rate (Exit) 77 bpm    Oxygen  Saturation (Admit) 100 %    Oxygen  Saturation (Exercise) 96 %    Oxygen  Saturation (Exit) 96 %    Rating of Perceived Exertion (Exercise) 14    Perceived Dyspnea (Exercise) 3    Duration Continue with 30 min of aerobic exercise without signs/symptoms of physical distress.    Intensity THRR unchanged      Progression    Progression Continue to progress workloads to maintain intensity without signs/symptoms of physical distress.      Resistance Training   Training Prescription Yes    Weight 3lb weight    Reps 10-15    Time 10 Minutes      Treadmill   MPH 2.9    Grade 0    Minutes 15    METs 3.1      NuStep   Level 1    SPM 114    Minutes 15    METs 3.6      Oxygen    Maintain Oxygen  Saturation 88% or higher          Social History   Tobacco Use  Smoking Status Former   Current packs/day: 0.00   Average packs/day: 1 pack/day for 40.0 years (40.0 ttl pk-yrs)   Types: Cigarettes   Start date: 09/15/1967   Quit date: 09/15/2007   Years since quitting: 17.0  Smokeless Tobacco Never  Tobacco Comments   Former smoker Quit 2008    Goals Met:  Proper associated with RPD/PD & O2 Sat [place] with exercise equipment Exercise tolerated well No report of concerns or symptoms today Strength training completed today  Goals Unmet:  Not Applicable  Comments: Service time is from 1314 to 48.    Dr. Slater Staff is Medical Director for Pulmonary Rehab at [facility].

## 2024-10-08 NOTE — Telephone Encounter (Signed)
 Another message sent in another encounter

## 2024-10-08 NOTE — Telephone Encounter (Signed)
 Dr. Judeth Horn, Please see patient message and advise.  Thank you.

## 2024-10-13 NOTE — Progress Notes (Signed)
 Discharge Progress Report  Patient Details  Name: [name] MRN: [id number] Date of Birth: Sep 01, 1946 Referring Provider:   [name] Pulmonary Rehab Walk Test from 07/04/2024 in [facility] for Heart, Vascular, & Lung Health  Referring Provider [name]     Number of Visits: 56  Reason for Discharge:  Patient has met program and personal goals.  Smoking History:  Social History   Tobacco Use  Smoking Status Former   Current packs/day: 0.00   Average packs/day: 1 pack/day for 40.0 years (40.0 ttl pk-yrs)   Types: Cigarettes   Start date: 09/15/1967   Quit date: 09/15/2007   Years since quitting: 17.0  Smokeless Tobacco Never  Tobacco Comments   Former smoker Quit 2008    Diagnosis:  Pulmonary HTN (HCC)  ADL UCSD:  Pulmonary Assessment Scores     Row Name 07/04/24 [phone] 07/04/24 0953 09/30/24 1522     ADL UCSD   ADL Phase -- Entry Exit   SOB Score total -- 42 49     CAT Score   CAT Score -- 12 13     mMRC Score   mMRC Score -- 3 1      Initial Exercise Prescription:  Initial Exercise Prescription - 07/04/24 1000       Date of Initial Exercise RX and Referring Provider   Date 07/04/24    Referring Provider [name]    Expected Discharge Date 09/30/24      Oxygen    Maintain Oxygen  Saturation 88% or higher      Treadmill   MPH 2.3    Grade 0    Minutes 15    METs 3      NuStep   Level 2    SPM 103    Minutes 15    METs 3.1      Prescription Details   Frequency (times per week) 2    Duration Progress to 30 minutes of continuous aerobic without signs/symptoms of physical distress      Intensity   THRR 40-80% of Max Heartrate 56-112    Ratings of Perceived Exertion 11-13    Perceived Dyspnea 0-4      Progression   Progression Continue to progress workloads to maintain intensity without signs/symptoms of physical distress.      Resistance Training   Training Prescription Yes    Weight blue bands    Reps  10-15          Discharge Exercise Prescription (Final Exercise Prescription Changes):  Exercise Prescription Changes - 10/07/24 1500       Response to Exercise   Blood Pressure (Admit) 130/68    Blood Pressure (Exercise) 142/70    Blood Pressure (Exit) 128/60    Heart Rate (Admit) 64 bpm    Heart Rate (Exercise) 87 bpm    Heart Rate (Exit) 77 bpm    Oxygen  Saturation (Admit) 100 %    Oxygen  Saturation (Exercise) 96 %    Oxygen  Saturation (Exit) 96 %    Rating of Perceived Exertion (Exercise) 14    Perceived Dyspnea (Exercise) 3    Duration Continue with 30 min of aerobic exercise without signs/symptoms of physical distress.    Intensity THRR unchanged      Progression   Progression Continue to progress workloads to maintain intensity without signs/symptoms of physical distress.      Resistance Training   Training Prescription Yes    Weight 3lb weight    Reps 10-15  Time 10 Minutes      Treadmill   MPH 2.9    Grade 0    Minutes 15    METs 3.1      NuStep   Level 1    SPM 114    Minutes 15    METs 3.6      Oxygen    Maintain Oxygen  Saturation 88% or higher          Functional Capacity:  6 Minute Walk     Row Name 07/04/24 1008 09/30/24 1539       6 Minute Walk   Phase Initial Discharge    Distance 1443 feet 1680 feet    Distance % Change -- 16 %    Distance Feet Change -- 237 ft    Walk Time 6 minutes 6 minutes    # of Rest Breaks 0 0    MPH 2.73 3.18    METS 2.61 3.27    RPE 12 13    Perceived Dyspnea  3 3    VO2 Peak 9.14 11.43    Symptoms No No    Resting HR 65 bpm 69 bpm    Resting BP 130/66 140/60    Resting Oxygen  Saturation  98 % 100 %    Exercise Oxygen  Saturation  during 6 min walk 92 % 94 %    Max Ex. HR 88 bpm 101 bpm    Max Ex. BP 142/62 164/64    2 Minute Post BP 130/64 160/68      Interval HR   1 Minute HR 78 75    2 Minute HR 82 79    3 Minute HR 88 85    4 Minute HR 83 95    5 Minute HR 87 96    6 Minute HR 93 101     2 Minute Post HR 71 73    Interval Heart Rate? Yes --      Interval Oxygen    Interval Oxygen ? Yes --    Baseline Oxygen  Saturation % 98 % 100 %    1 Minute Oxygen  Saturation % 99 % 97 %    1 Minute Liters of Oxygen  0 L 0 L    2 Minute Oxygen  Saturation % 95 % 97 %    2 Minute Liters of Oxygen  0 L 0 L    3 Minute Oxygen  Saturation % 93 % 94 %    3 Minute Liters of Oxygen  0 L 0 L    4 Minute Oxygen  Saturation % 92 % 96 %    4 Minute Liters of Oxygen  0 L 0 L    5 Minute Oxygen  Saturation % 93 % 96 %    5 Minute Liters of Oxygen  0 L 0 L    6 Minute Oxygen  Saturation % 95 % 97 %    6 Minute Liters of Oxygen  0 L 0 L    2 Minute Post Oxygen  Saturation % 98 % 100 %    2 Minute Post Liters of Oxygen  0 L 0 L       Psychological, QOL, Others - Outcomes: PHQ 2/9:    09/30/2024    3:22 PM 07/04/2024    9:54 AM 05/19/2024    4:15 PM 02/27/2024    4:09 PM 12/25/2018    3:09 PM  Depression screen PHQ 2/9  Decreased Interest 0 0 0 2 0  Down, Depressed, Hopeless 0 0 0 0 0  PHQ - 2 Score 0 0 0  2 0  Altered sleeping 1 1 1 1    Tired, decreased energy 1 0 1 2   Change in appetite 0 0 0 0   Feeling bad or failure about yourself  0 0 0 0   Trouble concentrating 1 0 0 0   Moving slowly or fidgety/restless 0 0 0 0   Suicidal thoughts 0 0 0 0   PHQ-9 Score 3 1  2  5     Difficult doing work/chores Somewhat difficult Not difficult at all Somewhat difficult Somewhat difficult      Data saved with a previous flowsheet row definition    Quality of Life:   Personal Goals: Goals established at orientation with interventions provided to work toward goal.  Personal Goals and Risk Factors at Admission - 07/04/24 0917       Core Components/Risk Factors/Patient Goals on Admission   Admit Weight --    Goal Weight: Short Term --    Goal Weight: Long Term --    Improve shortness of breath with ADL's Yes    Intervention Provide education, individualized exercise plan and daily activity instruction  to help decrease symptoms of SOB with activities of daily living.    Expected Outcomes Short Term: Improve cardiorespiratory fitness to achieve a reduction of symptoms when performing ADLs;Long Term: Be able to perform more ADLs without symptoms or delay the onset of symptoms    Expected Outcomes --           Personal Goals Discharge:  Goals and Risk Factor Review     Row Name 07/09/24 1610 08/04/24 9077 09/09/24 1602 10/13/24 0956       Core Components/Risk Factors/Patient Goals Review   Personal Goals Review Improve shortness of breath with ADL's;Develop more efficient breathing techniques such as purse lipped breathing and diaphragmatic breathing and practicing self-pacing with activity. Improve shortness of breath with ADL's;Develop more efficient breathing techniques such as purse lipped breathing and diaphragmatic breathing and practicing self-pacing with activity. Improve shortness of breath with ADL's Improve shortness of breath with ADL's    Review Monthly review of patient's Core Components/Risk Factors/Patient Goals are as follows: Unable to assess goals. Liviah hasn't started the program yet. She is scheduled to start tomorrow, 07/10/24. Monthly review of patient's Core Components/Risk Factors/Patient Goals are as follows: Goal in progress for improving her shortness of breath with ADLs. Anabeth is trying to build up her strength and endurance. She completed the [name] at [place] and continued to the [facility] last month. She is progressing on Nustep and treadmill, increasing her workload, METs, speed and incline. She is stable on room air with exertion. Goal met for developing more efficient breathing techniques such as purse lipped breathing and diaphragmatic breathing; and practicing self-pacing with activity. Bella can perform purse lipped breathing while short of breath. She demonstrated this while performing the warmup and while exercising. She can initiate  PLB on her own. She works on diaphragmatic breathing at home. Mirielle will continue to benefit from PR for nutrition, education, exercise, and lifestyle modification. Monthly review of patient's Core Components/Risk Factors/Patient Goals are as follows: Goal in progress for improving her shortness of breath with ADLs. Diana is trying to build up her strength and endurance. She is progressing on Nustep and treadmill, increasing her workload, METs, speed and incline. She is stable on room air with exertion. Leslee will continue to benefit from PR for nutrition, education, exercise, and lifestyle modification. Reve graduated from the [employer] on 10/07/24.  She met her goal for improving shortness of breath with ADL's. She was able to increase her workloads and MET's during the program. She also was able to increase her endurance and stamina while in the program. Kennidee always had a positive attitude and did well during the program.    Expected Outcomes Pt will show progress toward meeting expected goals and outcomes. Pt will show progress toward meeting expected goals and outcomes. Pt will show progress toward meeting expected goals and outcomes. To continue to exercise and modify her lifestyle post graduation       Exercise Goals and Review:  Exercise Goals     Row Name 07/04/24 0921             Exercise Goals   Increase Physical Activity Yes       Intervention Provide advice, education, support and counseling about physical activity/exercise needs.;Develop an individualized exercise prescription for aerobic and resistive training based on initial evaluation findings, risk stratification, comorbidities and participant's personal goals.       Expected Outcomes Short Term: Attend rehab on a regular basis to increase amount of physical activity.;Long Term: Add in home exercise to make exercise part of routine and to increase amount of physical activity.;Long Term: Exercising regularly at least  3-5 days a week.       Increase Strength and Stamina Yes       Intervention Provide advice, education, support and counseling about physical activity/exercise needs.;Develop an individualized exercise prescription for aerobic and resistive training based on initial evaluation findings, risk stratification, comorbidities and participant's personal goals.       Expected Outcomes Short Term: Increase workloads from initial exercise prescription for resistance, speed, and METs.;Short Term: Perform resistance training exercises routinely during rehab and add in resistance training at home;Long Term: Improve cardiorespiratory fitness, muscular endurance and strength as measured by increased METs and functional capacity ( )       Able to understand and use rate of perceived exertion (RPE) scale Yes       Intervention Provide education and explanation on how to use RPE scale       Expected Outcomes Short Term: Able to use RPE daily in rehab to express subjective intensity level;Long Term:  Able to use RPE to guide intensity level when exercising independently       Able to understand and use Dyspnea scale Yes       Intervention Provide education and explanation on how to use Dyspnea scale       Expected Outcomes Short Term: Able to use Dyspnea scale daily in rehab to express subjective sense of shortness of breath during exertion;Long Term: Able to use Dyspnea scale to guide intensity level when exercising independently       Knowledge and understanding of Target Heart Rate Range (THRR) Yes       Intervention Provide education and explanation of THRR including how the numbers were predicted and where they are located for reference       Expected Outcomes Short Term: Able to state/look up THRR;Long Term: Able to use THRR to govern intensity when exercising independently;Short Term: Able to use daily as guideline for intensity in rehab       Understanding of Exercise Prescription Yes       Intervention  Provide education, explanation, and written materials on patient's individual exercise prescription       Expected Outcomes Long Term: Able to explain home exercise prescription to exercise independently;Short Term: Able to explain program  exercise prescription          Exercise Goals Re-Evaluation:  Exercise Goals Re-Evaluation     Row Name 07/04/24 9076 07/08/24 0903 08/05/24 1546 09/02/24 1541 10/07/24 1615     Exercise Goal Re-Evaluation   Exercise Goals Review Increase Physical Activity;Increase Strength and Stamina;Understanding of Exercise Prescription;Able to understand and use Dyspnea scale;Knowledge and understanding of Target Heart Rate Range (THRR);Able to understand and use rate of perceived exertion (RPE) scale Increase Physical Activity;Increase Strength and Stamina;Understanding of Exercise Prescription;Able to understand and use Dyspnea scale;Knowledge and understanding of Target Heart Rate Range (THRR);Able to understand and use rate of perceived exertion (RPE) scale Increase Physical Activity;Increase Strength and Stamina;Understanding of Exercise Prescription;Able to understand and use Dyspnea scale;Knowledge and understanding of Target Heart Rate Range (THRR);Able to understand and use rate of perceived exertion (RPE) scale Increase Physical Activity;Increase Strength and Stamina;Understanding of Exercise Prescription;Able to understand and use Dyspnea scale;Knowledge and understanding of Target Heart Rate Range (THRR);Able to understand and use rate of perceived exertion (RPE) scale Increase Physical Activity;Increase Strength and Stamina;Understanding of Exercise Prescription;Able to understand and use Dyspnea scale;Knowledge and understanding of Target Heart Rate Range (THRR);Able to understand and use rate of perceived exertion (RPE) scale   Comments Samadhi is transferring from [facility] to [facility] Pt to begin exercise 8/28 here at [facility]. Will progress as tolerated. Pt has completed  8 exercise sessions. She is currently exercising on the recumbent stepper for 15 min, level 1, METs 3.1. She then is walking on the treadmill for 15 min, speed 2.7, METs 2.9. She does not want to increase her resistance on the stepper, prefers to increase speed. She performs warm up and cool down standing, including squats. She is unable to use bands due to a left shoulder injury so she is using a 3 lb weight for her right shoulder/arm. Will progress as tolerated. Pt has completed 13 exercise sessions, missing 3 sessions. She is currently exercising on the recumbent stepper for 15 min, level 1, METs 2.8. She then is walking on the treadmill for 15 min, speed 2.3, METs 2.6. She was sick last week so lighter exercise today. She does not want to increase her resistance on the stepper. She performs warm up and cool down standing, including squats. She is unable to use bands due to a left shoulder injury so she is using a 3 lb weight for her right shoulder/arm. Will progress as tolerated. Pt completed 23 exercise sessions. Her max METs were 3.6 on the stepper and 3.1 on the treadmill. She increased her 6 min walk test by 237 ft, 16 %. She enjoyed the program and was thankful. She plans to continue walking at home.   Expected Outcomes Through exercise at rehab and home, the patient will decrease shortness of breath with daily activities and feel confident in carrying out an exercise regimen at home. Through exercise at rehab and home, the patient will decrease shortness of breath with daily activities and feel confident in carrying out an exercise regimen at home. Through exercise at rehab and home, the patient will decrease shortness of breath with daily activities and feel confident in carrying out an exercise regimen at home. Through exercise at rehab and home, the patient will decrease shortness of breath with daily activities and feel confident in carrying out an exercise regimen at home. Through exercise at rehab  and home, the patient will decrease shortness of breath with daily activities and feel confident in carrying out an exercise  regimen at home.      Nutrition & Weight - Outcomes:   Post Biometrics - 07/04/24 0910        Post  Biometrics   Height 5' 3.58 (1.615 m)    BMI (Calculated) 25.53          Nutrition:   Nutrition Discharge:   Education Questionnaire Score:  Knowledge Questionnaire Score - 09/30/24 1527       Knowledge Questionnaire Score   Post Score 17/18          Goals reviewed with patient; copy given to patient.

## 2024-10-14 ENCOUNTER — Encounter: Payer: Self-pay | Admitting: Cardiology

## 2024-10-15 ENCOUNTER — Encounter: Payer: Self-pay | Admitting: Pulmonary Disease

## 2024-10-15 DIAGNOSIS — R059 Cough, unspecified: Secondary | ICD-10-CM | POA: Diagnosis not present

## 2024-10-15 DIAGNOSIS — Z03818 Encounter for observation for suspected exposure to other biological agents ruled out: Secondary | ICD-10-CM | POA: Diagnosis not present

## 2024-10-16 ENCOUNTER — Telehealth: Payer: Self-pay | Admitting: *Deleted

## 2024-10-16 NOTE — Telephone Encounter (Signed)
 NFN

## 2024-10-16 NOTE — Telephone Encounter (Signed)
 Copied from CRM #[id number]. Topic: Clinical - Medication Question >> Oct 14, 2024  1:44 PM Rozanna G wrote: Reason for CRM: Pt wanted to get a message to Meagan stated she has a bad cold and stated Perls is a capsule with a liquid in it works well for her. Stated if he wants her to come in she will be happy too.

## 2024-10-16 NOTE — Telephone Encounter (Signed)
 Received mychart msg from the pt stating:  Dr. Celina, I fortunately was able to get a spur-of-moment appt w/Dr. Valora, my primary care doctor, this morning.  Tests were done for obvious possible infections and all came back negative.  I mis-read the report and thought today's covid test was positive but it was showing results from September 2024.  Dr. Valora sent prescription for Benzonatate (Tessalon Perles) which helped me last year with bad cough during covid and I'm to call office if not better by Friday, 10/17/2024.  Today is day 4 of this sickness (headache, sinus congestion, dry hacking cough, and occasional light green sputum).  Hopeful that this will resolve quickly but I really need relief from the bad cough.  Thank you. [name]

## 2024-10-20 ENCOUNTER — Ambulatory Visit: Payer: Self-pay | Admitting: Pulmonary Disease

## 2024-10-20 NOTE — Telephone Encounter (Signed)
 I called and spoke with patient, advised her that Dr. Annella would not be back in the office until Wednesday 12/10.  I let her know I could schedule her with a NP tomorrow, however we do not have anything available today and actually changed appointments to virtual if we could d/t the weather.  She said she would just keep her appointment on Wednesday with Dr. Annella.  Nothing further needed.

## 2024-10-20 NOTE — Telephone Encounter (Signed)
 Please advise

## 2024-10-20 NOTE — Telephone Encounter (Signed)
 FYI Only or Action Required?: Action required by provider: request for appointment. Has appointment 10/22/2024, would like to be seen sooner.  Patient is followed in Pulmonology for Pulmonary HTN, last seen on 07/30/2024 by [name], Bridget SAUNDERS, MD.  Called Nurse Triage reporting Nasal Congestion.  Symptoms began several months ago.  Interventions attempted: Prescription medications: Tesslon Pearls, robitussin.  Symptoms are: unchanged.  Triage Disposition: See PCP When Office is Open (Within 3 Days)  Patient/caregiver understands and will follow disposition?:   FYI Only or Action Required?: Action required by provider: request for appointment.  Patient was last seen in primary care on 10/15/2024.  Called Nurse Triage reporting Nasal Congestion.  Symptoms began several months ago.  Interventions attempted: Prescription medications: Tesslon pearls, robitussin.  Symptoms are: unchanged.  Triage Disposition: See PCP When Office is Open (Within 3 Days)  Patient/caregiver understands and will follow disposition?:   Copied from CRM #[id number]. Topic: Clinical - Red Word Triage >> Oct 20, 2024 10:00 AM [name] wrote: Red Word that prompted transfer to Nurse Triage: Discoloration of Mucus/Excessive: Patient is having chest congestion and excessive phlegm that she cannot always get up, states it is yellowish - it was green until she stopped using her Ohtuvayre . She is experiencing a cough as well. Reason for Disposition  Cough has been present for > 3 weeks  Answer Assessment - Initial Assessment Questions Phlegm production and diarrrhea. Stopped Ohtuvayre  around 10/17/2024 because she thought this was causing productive cough and diarrhea. Saw Dr Edger, Flu, RSV test done with PCP, all negative. Has been taking Robitussin, Tesslon Pearls. Clear Nasal congestion. Wants to know if this is acute or if something has been going on in her lungs and she didn't know it. Patient has appointment  Wednesday but could like to be seen sooner. Is there another medication she can get to replace the [facility]? Worked well for her breathing but feels like it had too many side effects.  1. ONSET: When did the cough begin?      States she September, after she start Ohtuvayre . 3. SPUTUM: Describe the color of your sputum (e.g., none, dry cough; clear, white, yellow, green)     green 4. HEMOPTYSIS: Are you coughing up any blood? If Yes, ask: How much? (e.g., flecks, streaks, tablespoons, etc.)     Denies 5. DIFFICULTY BREATHING: Are you having difficulty breathing? If Yes, ask: How bad is it? (e.g., mild, moderate, severe)      More short winded with some exertion  6. FEVER: Do you have a fever? If Yes, ask: What is your temperature, how was it measured, and when did it start?     Denies 7. CARDIAC HISTORY: Do you have any history of heart disease? (e.g., heart attack, congestive heart failure)      Afib, HTN 8. LUNG HISTORY: Do you have any history of lung disease?  (e.g., pulmonary embolus, asthma, emphysema)     Hx lung cancer with part of lung gone, COPD, emphazema 10. OTHER SYMPTOMS: Do you have any other symptoms? (e.g., runny nose, wheezing, chest pain)       Nasal discharge 12. TRAVEL: Have you traveled out of the country in the last month? (e.g., travel history, exposures)       denies  Answer Assessment - Initial Assessment Questions Had diarrhea that went away when she stopped the [place].  1. DIARRHEA SEVERITY: How bad is the diarrhea? How many more stools have you had in the past 24 hours than normal?  Denies 4. VOMITING: Are you also vomiting? If Yes, ask: How many times in the past 24 hours?      Denies 5. ABDOMEN PAIN: Are you having any abdomen pain? If Yes, ask: What does it feel like? (e.g., crampy, dull, intermittent, constant)      Denies  Protocols used: Cough - Acute Non-Productive-A-AH, Diarrhea-A-AH

## 2024-10-22 ENCOUNTER — Encounter: Payer: Self-pay | Admitting: Pulmonary Disease

## 2024-10-22 ENCOUNTER — Ambulatory Visit: Admitting: Pulmonary Disease

## 2024-10-22 VITALS — BP 156/73 | HR 65 | Temp 98.0°F | Ht 63.0 in | Wt 148.0 lb

## 2024-10-22 DIAGNOSIS — J441 Chronic obstructive pulmonary disease with (acute) exacerbation: Secondary | ICD-10-CM | POA: Diagnosis not present

## 2024-10-22 DIAGNOSIS — R0609 Other forms of dyspnea: Secondary | ICD-10-CM | POA: Diagnosis not present

## 2024-10-22 DIAGNOSIS — D0472 Carcinoma in situ of skin of left lower limb, including hip: Secondary | ICD-10-CM | POA: Diagnosis not present

## 2024-10-22 DIAGNOSIS — R931 Abnormal findings on diagnostic imaging of heart and coronary circulation: Secondary | ICD-10-CM

## 2024-10-22 DIAGNOSIS — J449 Chronic obstructive pulmonary disease, unspecified: Secondary | ICD-10-CM

## 2024-10-22 DIAGNOSIS — Z87891 Personal history of nicotine dependence: Secondary | ICD-10-CM | POA: Diagnosis not present

## 2024-10-22 MED ORDER — AZITHROMYCIN 250 MG PO TABS: ORAL_TABLET | ORAL | 0 refills | Status: AC

## 2024-10-22 NOTE — Patient Instructions (Addendum)
 Azithromycin prescribed for cough  No more Ohtuvayre    No other changes to medicine  Return on 01/22/25 - already scheduled
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
## 2024-10-27 ENCOUNTER — Telehealth: Payer: Self-pay | Admitting: Cardiology

## 2024-10-27 MED ORDER — METOPROLOL SUCCINATE ER 50 MG PO TB24: 50.0000 mg | ORAL_TABLET | Freq: Every day | ORAL | 3 refills | Status: AC

## 2024-10-27 NOTE — Telephone Encounter (Signed)
 Refill sent

## 2024-10-27 NOTE — Telephone Encounter (Signed)
°*  STAT* If patient is at the pharmacy, call can be transferred to refill team.   1. Which medications need to be refilled? (please list name of each medication and dose if known)   metoprolol  succinate (TOPROL  XL) 50 MG 24 hr tablet     2. Would you like to learn more about the convenience, safety, & potential cost savings by using the [facility]? No    3. Are you open to using the [facility] (Type [facility]. No    4. Which pharmacy/location (including street and city if local pharmacy) is medication to be sent to?[id number] - [place] ([address]), [place] - [address]    5. Do they need a 30 day or 90 day supply? 90 day   Pt is out of medication

## 2024-10-29 ENCOUNTER — Other Ambulatory Visit (HOSPITAL_BASED_OUTPATIENT_CLINIC_OR_DEPARTMENT_OTHER): Payer: Self-pay

## 2024-10-29 ENCOUNTER — Emergency Department (HOSPITAL_BASED_OUTPATIENT_CLINIC_OR_DEPARTMENT_OTHER)
Admission: EM | Admit: 2024-10-29 | Discharge: 2024-10-29 | Disposition: A | Discharge status: Home / Self Care | Attending: Emergency Medicine | Admitting: Emergency Medicine

## 2024-10-29 ENCOUNTER — Telehealth: Payer: Self-pay

## 2024-10-29 ENCOUNTER — Emergency Department (HOSPITAL_BASED_OUTPATIENT_CLINIC_OR_DEPARTMENT_OTHER): Admitting: Radiology

## 2024-10-29 ENCOUNTER — Encounter (HOSPITAL_BASED_OUTPATIENT_CLINIC_OR_DEPARTMENT_OTHER): Payer: Self-pay

## 2024-10-29 DIAGNOSIS — J449 Chronic obstructive pulmonary disease, unspecified: Secondary | ICD-10-CM | POA: Diagnosis not present

## 2024-10-29 DIAGNOSIS — Z7901 Long term (current) use of anticoagulants: Secondary | ICD-10-CM | POA: Insufficient documentation

## 2024-10-29 DIAGNOSIS — Z91040 Latex allergy status: Secondary | ICD-10-CM | POA: Insufficient documentation

## 2024-10-29 DIAGNOSIS — J101 Influenza due to other identified influenza virus with other respiratory manifestations: Secondary | ICD-10-CM | POA: Diagnosis not present

## 2024-10-29 DIAGNOSIS — Z85118 Personal history of other malignant neoplasm of bronchus and lung: Secondary | ICD-10-CM | POA: Insufficient documentation

## 2024-10-29 DIAGNOSIS — R059 Cough, unspecified: Secondary | ICD-10-CM | POA: Diagnosis present

## 2024-10-29 LAB — RESP PANEL BY RT-PCR (RSV, FLU A&B, COVID)  RVPGX2
Influenza A by PCR: POSITIVE — AB
Influenza B by PCR: NEGATIVE
Resp Syncytial Virus by PCR: NEGATIVE
SARS Coronavirus 2 by RT PCR: NEGATIVE

## 2024-10-29 MED ORDER — ALBUTEROL SULFATE HFA 108 (90 BASE) MCG/ACT IN AERS
INHALATION_SPRAY | RESPIRATORY_TRACT | Status: AC
  Administered 2024-10-29: 14:00:00 2 via RESPIRATORY_TRACT
  Filled 2024-10-29: qty 6.7

## 2024-10-29 MED ORDER — ALBUTEROL SULFATE HFA 108 (90 BASE) MCG/ACT IN AERS: 2.0000 | INHALATION_SPRAY | Freq: Once | RESPIRATORY_TRACT | Status: AC

## 2024-10-29 MED ORDER — OSELTAMIVIR PHOSPHATE 75 MG PO CAPS
75.0000 mg | ORAL_CAPSULE | Freq: Two times a day (BID) | ORAL | 0 refills | Status: AC
  Filled 2024-10-29: qty 10, 5d supply, fill #0

## 2024-10-29 MED ORDER — PREDNISONE 20 MG PO TABS: ORAL_TABLET | ORAL | 0 refills | Status: DC

## 2024-10-29 MED ORDER — PREDNISONE 10 MG PO TABS
40.0000 mg | ORAL_TABLET | Freq: Every day | ORAL | 0 refills | Status: AC
  Filled 2024-10-29: qty 20, 5d supply, fill #0

## 2024-10-29 NOTE — Telephone Encounter (Signed)
°  Patient VBU. Rx at pharmacy   Copied from CRM [id number]. Topic: Clinical - Medical Advice >> Oct 28, 2024  5:00 PM Devaughn RAMAN wrote: Reason for CRM: Pt called regarding getting a steroid. Pt stated she is finished with the antibiotic and her cough is so bad she cannot sleep. Pt would like a steroid sent in to her pharmacy.

## 2024-10-29 NOTE — Telephone Encounter (Signed)
 Prednisone sent thanks

## 2024-10-29 NOTE — ED Provider Notes (Signed)
 [name] EMERGENCY DE[facility]MENT AT [facility] Provider Note   CSN: [id number] Arrival date & time: 10/29/24  1253     Patient presents with: Shortness of Breath   [name] is a 78 y.o. female.   HPI     78 year old female comes in with chief complaint of shortness of breath, worsening cough.  Patient states in the last 2 days, she started having increasing weakness, cough, headaches and some runny nose.  Her grandson currently has flu, although it is unclear if there was significant exposure.  Patient has history of pulmonary hypertension, A-fib status post ablation, COPD and emphysema.  She also has lung cancer, status post resection.  Patient is followed by pulmonologist.   Patient reports that she was started on new inhaler about 2 months ago by the pulmonologist and has had issues with cough and shortness of breath ever since then.  She stopped taking that inhaler sometime in November.  Subsequently had developed cold-like symptoms around Thanksgiving and also was treated with antibiotics for bronchitis recently.  Patient again agrees, that her cough never really resolved, but has slightly worsened and more importantly she has new weakness.  She denies any fevers.  Prior to Admission medications  Medication Sig Start Date End Date Taking? Authorizing Provider  oseltamivir  (TAMIFLU ) 75 MG capsule Take 1 capsule (75 mg total) by mouth every 12 (twelve) hours. 10/29/24  Yes Charlyn Sora, MD  predniSONE  (DELTASONE ) 10 MG tablet Take 4 tablets (40 mg total) by mouth daily for 5 days. 10/29/24 11/03/24 Yes Charlyn Sora, MD  Acetaminophen  Extra Strength 500 MG CAPS Take 500 mg by mouth as needed.    [provider]  albuterol  (VENTOLIN  HFA) 108 (90 Base) MCG/ACT inhaler Inhale 2 puffs into the lungs every 6 (six) hours as needed for wheezing or shortness of breath. 07/30/24   [name], Donnice SAUNDERS, MD  apixaban  (ELIQUIS ) 5 MG TABS tablet Take 1 tablet (5 mg  total) by mouth 2 (two) times daily. 11/22/23   Darron Deatrice LABOR, MD  Calcium  Carb-Cholecalciferol  (CALTRATE 600+D3 PO)     [provider]  Carboxymethylcellulose Sodium (THERATEARS) 0.25 % SOLN Place 1-2 drops into both eyes 3 (three) times daily.    [provider]  Coenzyme Q10 100 MG TABS Take 100 mg by mouth daily.    [provider]  Cyanocobalamin  (VITAMIN B 12) 500 MCG TABS Take 1,000 mcg by mouth daily.    [provider]  docusate sodium  (COLACE) 100 MG capsule Take 100 mg by mouth daily.    [provider]  Ensifentrine  (OHTUVAYRE ) 3 MG/2.5ML SUSP Inhale 2.5 mLs into the lungs 2 (two) times daily. Patient not taking: Reported on 10/22/2024    [provider]  Evolocumab  (REPATHA  SURECLICK) 140 MG/ML SOAJ Inject 140 mg into the skin every 14 (fourteen) days. 10/01/24   Darron Deatrice LABOR, MD  Fluocinolone Acetonide 0.01 % OIL SMARTSIG:1 Drop(s) In Ear(s) Daily PRN 08/18/24   [provider]  isosorbide  mononitrate (IMDUR ) 30 MG 24 hr tablet Take 1 tablet by mouth once daily 08/13/24   Camnitz, Soyla Lunger, MD  lisinopril  (ZESTRIL ) 20 MG tablet Take 1 tablet (20 mg total) by mouth in the morning and at bedtime. 12/12/23   Meng, Hao, PA  metoprolol  succinate (TOPROL  XL) 50 MG 24 hr tablet Take 1 tablet (50 mg total) by mouth daily. Take with or immediately following a meal. 10/27/24   Camnitz, Soyla Lunger, MD  metoprolol  tartrate (LOPRESSOR ) 25  MG tablet Take 25 mg by mouth daily as needed (Afib/ a flutter).    [provider]  nitroGLYCERIN  (NITROSTAT ) 0.4 MG SL tablet Place 1 tablet (0.4 mg total) under the tongue every 5 (five) minutes x 3 doses as needed for chest pain. 01/24/24   Darron Deatrice LABOR, MD  pyridOXINE  (VITAMIN B-6) 100 MG tablet Take 100 mg by mouth daily.    [provider]  rosuvastatin  (CRESTOR ) 20 MG tablet Take 1 tablet by mouth once daily 04/16/24   Arida, Muhammad A, MD  triamcinolone (NASACORT)  55 MCG/ACT AERO nasal inhaler Place 1 spray into the nose 2 (two) times daily. 02/13/24   [provider]  umeclidinium-vilanterol (ANORO ELLIPTA ) 62.5-25 MCG/ACT AEPB Inhale 1 puff into the lungs daily. 07/30/24   [name], Donnice SAUNDERS, MD  valACYclovir  (VALTREX ) 500 MG tablet Take 500 mg by mouth every evening. 08/16/21   [provider]    Allergies: Amiodarone , Amlodipine , Clindamycin /lincomycin, Doxycycline, Ohtuvayre  [ensifentrine ], Sulfa antibiotics, Lipitor [atorvastatin], Phenergan [promethazine hcl], Reclast  [zoledronic  acid], Carvedilol , Cephalexin, Dofetilide , Ketorolac , Diltiazem , and Latex    Review of Systems  All other systems reviewed and are negative.   Updated Vital Signs BP (!) 136/117 (BP Location: Left Arm)   Pulse 66   Temp 98.9 F (37.2 C)   Resp 18   SpO2 99%   Physical Exam Vitals and nursing note reviewed.  Constitutional:      Appearance: She is well-developed.  HENT:     Head: Atraumatic.  Cardiovascular:     Rate and Rhythm: Normal rate.  Pulmonary:     Effort: Pulmonary effort is normal.     Breath sounds: No decreased breath sounds or wheezing.  Musculoskeletal:     Cervical back: Normal range of motion and neck supple.  Skin:    General: Skin is warm and dry.  Neurological:     Mental Status: She is alert and oriented to person, place, and time.     (all labs ordered are listed, but only abnormal results are displayed) Labs Reviewed  RESP PANEL BY RT-PCR (RSV, FLU A&B, COVID)  RVPGX2 - Abnormal; Notable for the following components:      Result Value   Influenza A by PCR POSITIVE (*)    All other components within normal limits    EKG: EKG Interpretation Date/Time:  Wednesday October 29 2024 13:16:50 EST Ventricular Rate:  64 PR Interval:  174 QRS Duration:  90 QT Interval:  428 QTC Calculation: 441 R Axis:   10  Text Interpretation: Normal sinus rhythm Normal ECG When compared with ECG of 29-Jul-2024 11:13,  Nonspecific T wave abnormality no longer evident in Anterior leads No significant change since last tracing Confirmed by Doretha Folks ([id number]) on 10/29/2024 2:02:03 PM  Radiology: DG Chest 2 View Result Date: 10/29/2024 CLINICAL DATA:  Shortness of breath. EXAM: CHEST - 2 VIEW COMPARISON:  11/16/2021, 03/29/2020 FINDINGS: Minimal stable elevation of the left hemidiaphragm with subtle blunting of the left costophrenic angle unchanged. Lungs are otherwise clear and unchanged. Cardiomediastinal silhouette is normal. Stable thoracic spine compression fractures. IMPRESSION: No acute cardiopulmonary disease. Tiny chronic stable left pleural effusion. Electronically Signed   By: Toribio Agreste M.D.   On: 10/29/2024 13:58     Procedures   Medications Ordered in the ED  albuterol  (VENTOLIN  HFA) 108 (90 Base) MCG/ACT inhaler 2 puff (2 puffs Inhalation Given 10/29/24 1330)  Medical Decision Making Amount and/or Complexity of Data Reviewed Radiology: ordered.  Risk Prescription drug management.   78 year old female with history of paroxysmal A-fib on Eliquis , pulmonary hypertension, COPD/emphysema and lung cancer comes in with chief complaint of worsening shortness of breath, weakness, headaches and persistent cough.  She recently was put on azithromycin  for bronchitis.  Patient currently not in any respiratory distress.  Daughter is at the bedside, provides collateral history.  Have also reviewed patient's records including pulmonary notes, cardiology notes.  Chest x-ray independently interpreted by me.  There is no clear evidence of focal consolidation.  Differential diagnosis effectively includes flu, COVID, viral illness including RSV and pneumonia.  COPD exacerbation also considered.  On exam, there is no clear wheezing.  When patient coughs, I can faintly hear some wheezing.  She has no rhonchi.  I did discuss the utility of Tamiflu  and benefit of  reduction in symptoms by 20 hours.  Given patient's comorbidities, ultimately we have decided to prescribe her Tamiflu .  The patient appears reasonably screened and/or stabilized for discharge and I doubt any other medical condition or other [facility] requiring further screening, evaluation, or treatment in the ED at this time prior to discharge.   Results from the ER workup discussed with the patient face to face and all questions answered to the best of my ability. The patient is safe for discharge with strict return precautions.   Final diagnoses:  Influenza A    ED Discharge Orders          Ordered    oseltamivir  (TAMIFLU ) 75 MG capsule  Every 12 hours        10/29/24 1608    predniSONE  (DELTASONE ) 10 MG tablet  Daily        10/29/24 1608               Charlyn Sora, MD 10/29/24 1634

## 2024-10-29 NOTE — ED Triage Notes (Signed)
 She reports issues with shortness of breath since this September. She c/o increase in her cough upon being rx with Ohtuvayre .

## 2024-10-29 NOTE — ED Notes (Signed)
 ED Provider at bedside.

## 2024-10-29 NOTE — Discharge Instructions (Addendum)
 The workup in the emergency room reveals that you have influenza, the treatment for which is symptomatic relief only, and your body will fight the infection off in a few days.  Given your pulmonary and cardiac risk factors, we are prescribing you Tamiflu .  However this medication is effective only if you start within 48 hours of the onset of symptoms.  As discussed, studies have shown that it would reduce the severity of your symptoms by about 20 hours.  If you have profound side effects such as nausea and vomiting, then you may discontinue this medication.  Most important remedy to combat flu is to hydrate really well, and take supportive medications such as Tylenol  or Motrin for fevers and headaches.  Return to the emergency room if you start having worsening symptoms such as shortness of breath, severe nausea and vomiting, severe dehydration or confusion.

## 2024-10-29 NOTE — ED Notes (Signed)

## 2024-10-30 ENCOUNTER — Ambulatory Visit: Payer: Self-pay | Admitting: Pulmonary Disease

## 2024-10-30 NOTE — Telephone Encounter (Signed)
 Noted

## 2024-10-30 NOTE — Telephone Encounter (Signed)
 Attempted to call pt x1 Will attempt to contact pt at a later time.    Message from Isabell A sent at 10/30/2024 10:49 AM EST  Reason for Triage:  Experiencing cough, headache and weakness - patient would like to know if she should get another round of antibiotics.   Callback number:[phone]

## 2024-10-30 NOTE — Telephone Encounter (Signed)
 Appears to have been placed on my schedule as an add-on tomorrow.  She tested positive for flu a yesterday.  Antibiotics will not be effective against flu.  Needs to continue Tamiflu  and Tessalon Perles.  Stay well-hydrated.  Chest x-ray yesterday did not show infiltrate.  Will reassess in AM.

## 2024-10-30 NOTE — Telephone Encounter (Signed)
°  FYI Only or Action Required?: FYI only for provider: appointment scheduled on 12.19.25.  Patient is followed in Pulmonology for Chronic Obstructive Pulmonary Disease, last seen on 10/22/2024 by [name], Donnice SAUNDERS, MD.  Called Nurse Triage reporting Cough.  Symptoms began several weeks ago.  Interventions attempted: OTC medications: Tylenol  and Prescription medications: Benzonatate Pearls.  Symptoms are: stable.  Triage Disposition: See HCP Within 4 Hours (Or PCP Triage)  Patient/caregiver understands and will follow disposition?: Yes  Message from Isabell A sent at 10/30/2024 10:49 AM EST  Reason for Triage:  Experiencing cough, headache and weakness - patient would like to know if she should get another round of antibiotics.   Callback number:[phone]   Reason for Disposition  Wheezing is present  Answer Assessment - Initial Assessment Questions 1. ONSET: When did the cough begin?       X weeks  2. SEVERITY: How bad is the cough today?       Worse since onset   3. SPUTUM: Describe the color of your sputum (e.g., none, dry cough; clear, white, yellow, green)      yellow  4. HEMOPTYSIS: Are you coughing up any blood? If Yes, ask: How much? (e.g., flecks, streaks, tablespoons, etc.)      Crusty blood yesterday  5. DIFFICULTY BREATHING: Are you having difficulty breathing? If Yes, ask: How bad is it? (e.g., mild, moderate, severe)       Difficulty breathing worse than baseline  6. FEVER: Do you have a fever? If Yes, ask: What is your temperature, how was it measured, and when did it start?      101.1 F yesterday  7. CARDIAC HISTORY: Do you have any history of heart disease? (e.g., heart attack, congestive heart failure)       CAD, HTN  8. LUNG HISTORY: Do you have any history of lung disease?  (e.g., pulmonary embolus, asthma, emphysema)      COPD  9. PE RISK FACTORS: Do you have a history of blood clots? (or: recent major surgery,  recent prolonged travel, bedridden)      Pt does not have pe risk factors  10. OTHER SYMPTOMS:  Reports chest pain        Pt diagnosed with Flu A yesterday at ED  Pt reports fever, cough, headache, and weakness Pt is taking OTC Benzonatate pearls that pt states is helping. Pt is also taking Tylenol  for headache Pt scheduled for a visit on 12.19.25 for further evaluation and treatment. An acute visit scheduled for pt due to lack of availability to schedule hospital follow up visit. Pt agrees with plan of care, will call back for any worsening symptoms  Protocols used: Cough - Acute Productive-A-AH

## 2024-10-31 ENCOUNTER — Ambulatory Visit: Admitting: Pulmonary Disease

## 2024-11-14 ENCOUNTER — Other Ambulatory Visit: Payer: Self-pay

## 2024-11-14 ENCOUNTER — Emergency Department (HOSPITAL_BASED_OUTPATIENT_CLINIC_OR_DEPARTMENT_OTHER)

## 2024-11-14 ENCOUNTER — Emergency Department (HOSPITAL_BASED_OUTPATIENT_CLINIC_OR_DEPARTMENT_OTHER)
Admission: EM | Admit: 2024-11-14 | Discharge: 2024-11-14 | Disposition: A | Discharge status: Home / Self Care | Attending: Emergency Medicine | Admitting: Emergency Medicine

## 2024-11-14 ENCOUNTER — Ambulatory Visit: Payer: Self-pay | Admitting: Pulmonary Disease

## 2024-11-14 ENCOUNTER — Encounter (HOSPITAL_BASED_OUTPATIENT_CLINIC_OR_DEPARTMENT_OTHER): Payer: Self-pay

## 2024-11-14 ENCOUNTER — Emergency Department (HOSPITAL_BASED_OUTPATIENT_CLINIC_OR_DEPARTMENT_OTHER): Admitting: Radiology

## 2024-11-14 DIAGNOSIS — J449 Chronic obstructive pulmonary disease, unspecified: Secondary | ICD-10-CM | POA: Insufficient documentation

## 2024-11-14 DIAGNOSIS — R0600 Dyspnea, unspecified: Secondary | ICD-10-CM

## 2024-11-14 DIAGNOSIS — I2510 Atherosclerotic heart disease of native coronary artery without angina pectoris: Secondary | ICD-10-CM | POA: Insufficient documentation

## 2024-11-14 DIAGNOSIS — R0602 Shortness of breath: Secondary | ICD-10-CM | POA: Insufficient documentation

## 2024-11-14 DIAGNOSIS — Z951 Presence of aortocoronary bypass graft: Secondary | ICD-10-CM | POA: Diagnosis not present

## 2024-11-14 DIAGNOSIS — Z85118 Personal history of other malignant neoplasm of bronchus and lung: Secondary | ICD-10-CM | POA: Insufficient documentation

## 2024-11-14 DIAGNOSIS — I10 Essential (primary) hypertension: Secondary | ICD-10-CM | POA: Insufficient documentation

## 2024-11-14 DIAGNOSIS — Z79899 Other long term (current) drug therapy: Secondary | ICD-10-CM | POA: Insufficient documentation

## 2024-11-14 DIAGNOSIS — Z85828 Personal history of other malignant neoplasm of skin: Secondary | ICD-10-CM | POA: Insufficient documentation

## 2024-11-14 DIAGNOSIS — Z7901 Long term (current) use of anticoagulants: Secondary | ICD-10-CM | POA: Insufficient documentation

## 2024-11-14 DIAGNOSIS — Z91040 Latex allergy status: Secondary | ICD-10-CM | POA: Insufficient documentation

## 2024-11-14 LAB — BASIC METABOLIC PANEL WITH GFR
Anion gap: 12 (ref 5–15)
BUN: 12 mg/dL (ref 8–23)
CO2: 26 mmol/L (ref 22–32)
Calcium: 9.4 mg/dL (ref 8.9–10.3)
Chloride: 99 mmol/L (ref 98–111)
Creatinine, Ser: 0.7 mg/dL (ref 0.44–1.00)
GFR, Estimated: >60 mL/min
Glucose, Bld: 94 mg/dL (ref 70–99)
Potassium: 4.2 mmol/L (ref 3.5–5.1)
Sodium: 136 mmol/L (ref 135–145)

## 2024-11-14 LAB — CBC
HCT: 40.1 % (ref 36.0–46.0)
Hemoglobin: 14.1 g/dL (ref 12.0–15.0)
MCH: 32.8 pg (ref 26.0–34.0)
MCHC: 35.2 g/dL (ref 30.0–36.0)
MCV: 93.3 fL (ref 80.0–100.0)
Platelets: 154 K/uL (ref 150–400)
RBC: 4.3 MIL/uL (ref 3.87–5.11)
RDW: 13.4 % (ref 11.5–15.5)
WBC: 24.9 K/uL — ABNORMAL HIGH (ref 4.0–10.5)
nRBC: 0 % (ref 0.0–0.2)

## 2024-11-14 MED ORDER — IOHEXOL 350 MG/ML SOLN
75.0000 mL | Freq: Once | INTRAVENOUS | Status: AC | PRN
  Administered 2024-11-14: 75 mL via INTRAVENOUS

## 2024-11-14 NOTE — ED Triage Notes (Signed)
 Complaining of a cough since she has started a nebulized medication. She has been short of breath since the first of the year. She is having some pressure in the back around the ribcage.
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
## 2024-11-14 NOTE — Discharge Instructions (Signed)
 Looks like the shortness of breath is probably from your COPD and your pulmonary hypertension.  Follow-up with your doctor and your pulmonologist.

## 2024-11-14 NOTE — Telephone Encounter (Signed)
 FYI Only or Action Required?: Action required by provider: Heading to hospital per on-call provider's recommendation, requesting CT scan be scheduled earlier than due.  Patient is followed in Pulmonology for lung cancer, COPD, and emphysema, last seen on 10/22/2024 by [name], Donnice SAUNDERS, MD.  Called Nurse Triage reporting Cough and Shortness of Breath.  Symptoms began ongoing cough but worse symptoms past several days.  Interventions attempted: Nebulizer treatments and Increased fluids/rest.  Symptoms are: rapidly worsening.  Triage Disposition: Call EMS 911 Now  Patient/caregiver understands and will follow disposition?: Yes      Message from [facility] sent at 11/14/2024 10:18 AM EST:  Summary: Chronic cough   Reason for Triage:  Pt has had illnesses off and on since 12/1. History of Stage1B lung cancer and emphysema Has had bronchitis and flu recently. Started use of nebulizer that caused a chronic cough Having cough is not typical for her history of illnesses  Pt of [name]   CB#  [phone]         Reason for Disposition  [1] Chest pain lasts > 5 minutes AND [2] described as crushing, pressure-like, or heavy  Answer Assessment - Initial Assessment Questions This RN recommended pt go to hospital, pt refusing, pt requesting CT on lungs. This RN called on-call Dr. Kara, who agreed with disposition and advises pt go to hospital for symptoms, adding that hospital could do CT scan while there. This RN called pt back and relayed feedback from Dr. Kara. Pt states she will likely go to [facility] and will have daughter drive her.     Cold in early December into broncitis then the flu on top of that Still have this cough Gone through 2 antibx already Just want to be sure nothing new going on with my lungs More SOB than usual, moderate change, quite a bit more short-winded Not due for another CT on my lungs, had lobectomy lower left lobe in 2018, not due until  march or April, would like moved up to ensure nothing changing No change to awareness Not losing consciousness but last couple nights have back pain chronic but this is different, like pressure in thoracic area of back, affecting my breathing A little tightness in my chest, different from her usual with SOB, keeps me awake at night, past couple of days Had 2 prescriptions for antibx, early on had chest x-ray, didn't see anything At night wheezing some and rattling, kind of like a whine when she breathes in, sometimes depends on position, seems to be worse if lie on left side versus right Pleural effusion on left side since 2018 Chest discomfort not constant but lasts maybe an hour/1.5 hours, finally just have to get up More the pressure in the thoracic area of my back but causes me to have more shallow breathing No harsh sounds when breathing in No nausea, vomiting, no sweating   OTHER HX PATIENT MENTIONED: [name] prescribed nebulizer with ohtuvayre  6-8 months ago, started it back in late Spring, didn't have cough before But have stage 1B with surgery and no chemo or radiation, also COPD and emphysema As soon as started neb, started cough, sent messages to him, also phlegm, let him know if changes color, changed to green, let him know, then really nothing was done, also was in pulm rehab during that period since had surgery last January 2024, had postponed rehab because didn't want to exercise without mask in coldflu season, was in pulm rehab, ohytuvayre seemed to help  breathing some when exercised but created this cough, finally talked to him, would stop it then September or so when stopped it the cough went into a flu blown cold, had not had a cold or flu or anything in number of years because been very careful I'm a walker since open heart surgery 17 years ago but have gotten pretty broken down over the past year and a half, covid in 2024 then continual issues since  Protocols used: Chest  Pain-A-AH

## 2024-11-16 ENCOUNTER — Other Ambulatory Visit: Payer: Self-pay | Admitting: Cardiovascular Disease

## 2024-11-16 DIAGNOSIS — I480 Paroxysmal atrial fibrillation: Secondary | ICD-10-CM

## 2024-11-17 ENCOUNTER — Encounter: Payer: Self-pay | Admitting: Pulmonary Disease

## 2024-11-17 ENCOUNTER — Encounter: Payer: Self-pay | Admitting: Cardiology

## 2024-11-17 DIAGNOSIS — J410 Simple chronic bronchitis: Secondary | ICD-10-CM

## 2024-11-17 NOTE — Telephone Encounter (Signed)
 Noted.  CT angio done on 11/15/23.

## 2024-11-17 NOTE — Telephone Encounter (Signed)
 Nothing further needed

## 2024-11-17 NOTE — Telephone Encounter (Signed)
 Copied from CRM #[id number]. Topic: Clinical - Medical Advice >> Nov 17, 2024 10:47 AM Benton KIDD wrote: Reason for CRM: patient is calling because she has been sick for over 36 days now . Patient does not want to hear from a nurse . Patient is wanting to speak with dr annella about if she should consider taking her Ensifentrine  (OHTUVAYRE ) 3 MG/2.5ML SUSP. Because it did have good benefits from it besides the mucus . Please give patient a call asap Patient says to look at the [place] message also before answering the question to address everything

## 2024-11-17 NOTE — Telephone Encounter (Signed)
 Eliquis  5mg  refill request received. Patient is 79 years old, weight-64.9kg, Crea-0.70 on 11/14/24, Diagnosis-Afib, and last seen by Dr. Inocencio on 08/29/24. Dose is appropriate based on dosing criteria. Will send in refill to requested pharmacy.

## 2024-11-17 NOTE — Telephone Encounter (Signed)
 Discussed ongoing cough.  Discussed biologic therapies for poorly controlled asthmatic bronchitis.  CBC with differential, IgE, seasonal allergen panel ordered.  Also discussed getting sputum cups we can send a sample to the lab for AFB and culture.  Can you please assist with a lab appointment for the blood work?  Thank you.

## 2024-11-18 ENCOUNTER — Telehealth: Payer: Self-pay

## 2024-11-18 ENCOUNTER — Other Ambulatory Visit

## 2024-11-18 ENCOUNTER — Ambulatory Visit: Payer: Self-pay

## 2024-11-18 DIAGNOSIS — J410 Simple chronic bronchitis: Secondary | ICD-10-CM

## 2024-11-18 LAB — CBC WITH DIFFERENTIAL/PLATELET
Basophils Absolute: 0 K/uL (ref 0.0–0.1)
Basophils Relative: 0.3 % (ref 0.0–3.0)
Eosinophils Absolute: 0 K/uL (ref 0.0–0.7)
Eosinophils Relative: 0.2 % (ref 0.0–5.0)
HCT: 40.7 % (ref 36.0–46.0)
Hemoglobin: 13.9 g/dL (ref 12.0–15.0)
Lymphocytes Relative: 71.8 % — ABNORMAL HIGH (ref 12.0–46.0)
Lymphs Abs: 13.7 K/uL — ABNORMAL HIGH (ref 0.7–4.0)
MCHC: 34.1 g/dL (ref 30.0–36.0)
MCV: 94.9 fl (ref 78.0–100.0)
Monocytes Absolute: 0.5 K/uL (ref 0.1–1.0)
Monocytes Relative: 2.4 % — ABNORMAL LOW (ref 3.0–12.0)
Neutro Abs: 4.8 K/uL (ref 1.4–7.7)
Neutrophils Relative %: 25.3 % — ABNORMAL LOW (ref 43.0–77.0)
Platelets: 181 K/uL (ref 150.0–400.0)
RBC: 4.29 Mil/uL (ref 3.87–5.11)
RDW: 14.2 % (ref 11.5–15.5)
WBC: 19.1 K/uL (ref 4.0–10.5)

## 2024-11-18 NOTE — Telephone Encounter (Signed)
 Auth Submission: NO AUTH NEEDED Site of care: Site of care: [facility] Payer: Medicare A/B with BCBS supplement Medication & CPT/J Code(s) submitted: Leqvio  (Inclisiran) J1306 Diagnosis Code:  Route of submission (phone, fax, portal):  Phone # Fax # Auth type: Buy/Bill PB Units/visits requested: 284mg  x 2 doses Reference number:  Approval from: 11/18/24 to 12/13/25

## 2024-11-18 NOTE — Telephone Encounter (Signed)
 Harlene with [employer] calling to report Critical Lab.   White blood count: 19.1.   Attempted to contact CAL x3, PAS reports also attempting CAL x3. No response.    Copied from CRM [id number]. Topic: Clinical - Red Word Triage >> Nov 18, 2024  4:19 PM Devaughn RAMAN wrote: Red Word that prompted transfer to Nurse Triage: Harlene with [facility] calling to give critical labs for pt.

## 2024-11-19 ENCOUNTER — Encounter: Payer: Self-pay | Admitting: Pulmonary Disease

## 2024-11-19 NOTE — Telephone Encounter (Signed)
 Dr. Annella, Please advise regarding critical labs.  Thank you.

## 2024-11-19 NOTE — Telephone Encounter (Signed)
 Noted. Nothing further needed.

## 2024-11-19 NOTE — Telephone Encounter (Signed)
 Labs unchanged with persistent leukocytosis due to known CLL. No elevation in eosinophils. Awaiting other labs. No action required at this time.

## 2024-11-20 ENCOUNTER — Telehealth: Payer: Self-pay

## 2024-11-20 NOTE — Telephone Encounter (Signed)
 Copied from CRM #[id number]. Topic: Clinical - Lab/Test Results >> Nov 20, 2024 12:57 PM [name] wrote: Reason for CRM: Patient states she sent a MyChart message to Dr. Annella regarding her abnormal test results, she would like to hear from him or nurse right away as she is concerned.  Patient also states she can't use the sputum samples given to her as she cannot get any sputum up out her chest right now.  I called and spoke to pt. I informed pt that we are waiting on her pulmonologist to review these results and when he does, we will be in touch. Pt states she would prefer this be reviewed by tomorrow as she has been sick for 5 weeks and waited long enough. I informed pt that we can promise for things to be done on certain days but this will definitely be reviewed. Pt verbalized understanding. Routing to Dr Annella to review when he can.

## 2024-11-21 ENCOUNTER — Ambulatory Visit: Payer: Self-pay | Admitting: Pulmonary Disease

## 2024-11-21 LAB — IGE: IgE (Immunoglobulin E), Serum: <2 kU/L

## 2024-11-21 NOTE — Telephone Encounter (Signed)
 FYI Only or Action Required?: FYI only for provider: ED advised.  Patient is followed in Pulmonology for COPD , last seen on 10/22/2024 by [name], Donnice SAUNDERS, MD.  Called Nurse Triage reporting Dizziness.  Symptoms began today.  Interventions attempted: Nothing.  Symptoms are: gradually worsening.  Triage Disposition: Go to ED Now (or PCP Triage)  Patient/caregiver understands and will follow disposition?: Yes    Copied from CRM [id number]. Topic: Clinical - Red Word Triage >> Nov 21, 2024  4:26 PM Isabell A wrote: Red Word that prompted transfer to Nurse Triage: Dizzy spells all day today & for weeks and states today is the worse. Reason for Disposition  Patient sounds very sick or weak to the triager  Patient sounds very sick or weak to the triager  Answer Assessment - Initial Assessment Questions Pt states she and Dr. Annella have been communicating via Mychart and that after his last message to her two results came over. RN advised that he has not given us  recommendations on those. She states she has also been having dizzy spells all day and won't go away. Asking triage questions and she states yes to shortness of breath, worse than normal. She did a breathing treatment and after that she began to have some chest pain and pressure in her back in the thoracic area. Rn advised pt needs to go to the ER. She states she figured. She states she has someone to take her. Due to ED dispo, not all triage questions answered.     1. DESCRIPTION: Describe your dizziness.     Feels unsteady 2. LIGHTHEADED: Do you feel lightheaded? (e.g., somewhat faint, woozy, weak upon standing)    Shaky with standing 3. VERTIGO: Do you feel like either you or the room is spinning or tilting? (i.e., vertigo)     no 4. SEVERITY: How bad is it?  Do you feel like you are going to faint? Can you stand and walk?     yes 5. ONSET:  When did the dizziness begin?      6. AGGRAVATING FACTORS:  Does anything make it worse? (e.g., standing, change in head position)      7. HEART RATE: Can you tell me your heart rate? How many beats in 15 seconds?  (Note: Not all patients can do this.)        8. CAUSE: What do you think is causing the dizziness? (e.g., decreased fluids or food, diarrhea, emotional distress, heat exposure, new medicine, sudden standing, vomiting; unknown)      9. RECURRENT SYMPTOM: Have you had dizziness before? If Yes, ask: When was the last time? What happened that time?      10. OTHER SYMPTOMS: Do you have any other symptoms? (e.g., fever, chest pain, vomiting, diarrhea, bleeding)       Chest pain, shortness of breath  Answer Assessment - Initial Assessment Questions Chest pain after doing neb treatment. And increasing shortness of breath and dizziness. Recommended ER. Not all triage questions answered due to ED dispo. Pt states she has a ride to take her     1. LOCATION: Where does it hurt?        2. RADIATION: Does the pain go anywhere else? (e.g., into neck, jaw, arms, back)     Thoracic pain 3. ONSET: When did the chest pain begin? (Minutes, hours or days)       4. PATTERN: Does the pain come and go, or has it been constant since it started?  Does  it get worse with exertion?       5. DURATION: How long does it last (e.g., seconds, minutes, hours)      6. SEVERITY: How bad is the pain?  (e.g., Scale 1-10; mild, moderate, or severe)      7. CARDIAC RISK FACTORS: Do you have any history of heart problems or risk factors for heart disease? (e.g., angina, prior heart attack; diabetes, high blood pressure, high cholesterol, smoker, or strong family history of heart disease)      8. PULMONARY RISK FACTORS: Do you have any history of lung disease?  (e.g., blood clots in lung, asthma, emphysema, birth control pills)      9. CAUSE: What do you think is causing the chest pain?      10. OTHER SYMPTOMS: Do you have any other  symptoms? (e.g., dizziness, nausea, vomiting, sweating, fever, difficulty breathing, cough)       Dizziness, worsening shortness of breath 11. PREGNANCY: Is there any chance you are pregnant? When was your last menstrual period?  Protocols used: Dizziness - Lightheadedness-A-AH, Chest Pain-A-AH

## 2024-11-21 NOTE — Telephone Encounter (Signed)
 Dr. Annella, Please see patient message and advise.  She has also called the office with concerns and that message has been sent to you as well.  Thank you.

## 2024-11-22 ENCOUNTER — Encounter: Payer: Self-pay | Admitting: Pulmonary Disease

## 2024-11-22 LAB — ALLERGEN PROFILE, PERENNIAL ALLERGEN IGE

## 2024-11-24 NOTE — Telephone Encounter (Signed)
 Noted. NFN

## 2024-11-28 NOTE — Telephone Encounter (Signed)
 Please advise.

## 2024-12-18 ENCOUNTER — Other Ambulatory Visit: Payer: Self-pay | Admitting: Physician Assistant

## 2025-01-22 ENCOUNTER — Ambulatory Visit: Admitting: Pulmonary Disease

## 2025-01-27 ENCOUNTER — Ambulatory Visit: Admitting: Cardiovascular Disease
